# Patient Record
Sex: Female | Born: 1950 | Race: White | Hispanic: No | Smoking: Never smoker
Health system: Southern US, Community
[De-identification: ages and names within clinical notes are randomized; demographics above are authoritative.]

## PROBLEM LIST (undated history)

## (undated) DIAGNOSIS — J309 Allergic rhinitis, unspecified: Secondary | ICD-10-CM

## (undated) DIAGNOSIS — E559 Vitamin D deficiency, unspecified: Secondary | ICD-10-CM

## (undated) DIAGNOSIS — K219 Gastro-esophageal reflux disease without esophagitis: Secondary | ICD-10-CM

## (undated) DIAGNOSIS — N3020 Other chronic cystitis without hematuria: Secondary | ICD-10-CM

## (undated) DIAGNOSIS — Z9289 Personal history of other medical treatment: Secondary | ICD-10-CM

## (undated) DIAGNOSIS — D509 Iron deficiency anemia, unspecified: Secondary | ICD-10-CM

## (undated) DIAGNOSIS — E039 Hypothyroidism, unspecified: Secondary | ICD-10-CM

## (undated) DIAGNOSIS — G43909 Migraine, unspecified, not intractable, without status migrainosus: Secondary | ICD-10-CM

## (undated) DIAGNOSIS — Z87898 Personal history of other specified conditions: Secondary | ICD-10-CM

## (undated) DIAGNOSIS — H04129 Dry eye syndrome of unspecified lacrimal gland: Secondary | ICD-10-CM

## (undated) DIAGNOSIS — D649 Anemia, unspecified: Secondary | ICD-10-CM

## (undated) DIAGNOSIS — R202 Paresthesia of skin: Secondary | ICD-10-CM

## (undated) DIAGNOSIS — M797 Fibromyalgia: Secondary | ICD-10-CM

## (undated) DIAGNOSIS — Z5189 Encounter for other specified aftercare: Secondary | ICD-10-CM

## (undated) DIAGNOSIS — A159 Respiratory tuberculosis unspecified: Secondary | ICD-10-CM

## (undated) DIAGNOSIS — Z87442 Personal history of urinary calculi: Secondary | ICD-10-CM

## (undated) DIAGNOSIS — D351 Benign neoplasm of parathyroid gland: Secondary | ICD-10-CM

## (undated) DIAGNOSIS — R911 Solitary pulmonary nodule: Secondary | ICD-10-CM

## (undated) DIAGNOSIS — J45909 Unspecified asthma, uncomplicated: Secondary | ICD-10-CM

## (undated) DIAGNOSIS — E785 Hyperlipidemia, unspecified: Secondary | ICD-10-CM

## (undated) DIAGNOSIS — J189 Pneumonia, unspecified organism: Secondary | ICD-10-CM

## (undated) DIAGNOSIS — F0781 Postconcussional syndrome: Secondary | ICD-10-CM

## (undated) DIAGNOSIS — J4530 Mild persistent asthma, uncomplicated: Secondary | ICD-10-CM

## (undated) DIAGNOSIS — R7611 Nonspecific reaction to tuberculin skin test without active tuberculosis: Secondary | ICD-10-CM

## (undated) DIAGNOSIS — J449 Chronic obstructive pulmonary disease, unspecified: Secondary | ICD-10-CM

## (undated) DIAGNOSIS — R0600 Dyspnea, unspecified: Secondary | ICD-10-CM

## (undated) DIAGNOSIS — R5381 Other malaise: Secondary | ICD-10-CM

## (undated) DIAGNOSIS — R0789 Other chest pain: Secondary | ICD-10-CM

## (undated) DIAGNOSIS — F09 Unspecified mental disorder due to known physiological condition: Secondary | ICD-10-CM

## (undated) DIAGNOSIS — C55 Malignant neoplasm of uterus, part unspecified: Secondary | ICD-10-CM

## (undated) DIAGNOSIS — M5412 Radiculopathy, cervical region: Secondary | ICD-10-CM

## (undated) DIAGNOSIS — K2970 Gastritis, unspecified, without bleeding: Secondary | ICD-10-CM

## (undated) DIAGNOSIS — K589 Irritable bowel syndrome without diarrhea: Secondary | ICD-10-CM

## (undated) DIAGNOSIS — O871 Deep phlebothrombosis in the puerperium: Secondary | ICD-10-CM

## (undated) DIAGNOSIS — J42 Unspecified chronic bronchitis: Secondary | ICD-10-CM

## (undated) DIAGNOSIS — R5383 Other fatigue: Secondary | ICD-10-CM

## (undated) DIAGNOSIS — F419 Anxiety disorder, unspecified: Secondary | ICD-10-CM

## (undated) DIAGNOSIS — F329 Major depressive disorder, single episode, unspecified: Secondary | ICD-10-CM

## (undated) DIAGNOSIS — N393 Stress incontinence (female) (male): Secondary | ICD-10-CM

## (undated) DIAGNOSIS — R918 Other nonspecific abnormal finding of lung field: Secondary | ICD-10-CM

## (undated) DIAGNOSIS — R9389 Abnormal findings on diagnostic imaging of other specified body structures: Secondary | ICD-10-CM

## (undated) DIAGNOSIS — R51 Headache: Secondary | ICD-10-CM

## (undated) DIAGNOSIS — F332 Major depressive disorder, recurrent severe without psychotic features: Secondary | ICD-10-CM

## (undated) DIAGNOSIS — N200 Calculus of kidney: Secondary | ICD-10-CM

## (undated) DIAGNOSIS — R1084 Generalized abdominal pain: Secondary | ICD-10-CM

## (undated) DIAGNOSIS — I10 Essential (primary) hypertension: Secondary | ICD-10-CM

## (undated) DIAGNOSIS — M1990 Unspecified osteoarthritis, unspecified site: Secondary | ICD-10-CM

## (undated) DIAGNOSIS — J069 Acute upper respiratory infection, unspecified: Secondary | ICD-10-CM

## (undated) DIAGNOSIS — N183 Chronic kidney disease, stage 3 unspecified: Secondary | ICD-10-CM

## (undated) DIAGNOSIS — Z8719 Personal history of other diseases of the digestive system: Secondary | ICD-10-CM

## (undated) DIAGNOSIS — C499 Malignant neoplasm of connective and soft tissue, unspecified: Secondary | ICD-10-CM

## (undated) DIAGNOSIS — R6889 Other general symptoms and signs: Secondary | ICD-10-CM

## (undated) DIAGNOSIS — R110 Nausea: Secondary | ICD-10-CM

## (undated) DIAGNOSIS — S060X0A Concussion without loss of consciousness, initial encounter: Secondary | ICD-10-CM

## (undated) DIAGNOSIS — R3915 Urgency of urination: Secondary | ICD-10-CM

## (undated) DIAGNOSIS — Z9221 Personal history of antineoplastic chemotherapy: Secondary | ICD-10-CM

## (undated) DIAGNOSIS — F314 Bipolar disorder, current episode depressed, severe, without psychotic features: Secondary | ICD-10-CM

## (undated) DIAGNOSIS — Z87820 Personal history of traumatic brain injury: Secondary | ICD-10-CM

## (undated) DIAGNOSIS — Z780 Asymptomatic menopausal state: Secondary | ICD-10-CM

## (undated) DIAGNOSIS — Z973 Presence of spectacles and contact lenses: Secondary | ICD-10-CM

## (undated) DIAGNOSIS — IMO0001 Reserved for inherently not codable concepts without codable children: Secondary | ICD-10-CM

## (undated) DIAGNOSIS — M79605 Pain in left leg: Secondary | ICD-10-CM

## (undated) DIAGNOSIS — F411 Generalized anxiety disorder: Secondary | ICD-10-CM

## (undated) DIAGNOSIS — K580 Irritable bowel syndrome with diarrhea: Secondary | ICD-10-CM

## (undated) DIAGNOSIS — R55 Syncope and collapse: Secondary | ICD-10-CM

## (undated) DIAGNOSIS — J45991 Cough variant asthma: Secondary | ICD-10-CM

## (undated) DIAGNOSIS — Z227 Latent tuberculosis: Secondary | ICD-10-CM

## (undated) DIAGNOSIS — R769 Abnormal immunological finding in serum, unspecified: Secondary | ICD-10-CM

## (undated) DIAGNOSIS — Z86718 Personal history of other venous thrombosis and embolism: Secondary | ICD-10-CM

## (undated) HISTORY — DX: Major depressive disorder, recurrent severe without psychotic features: F33.2

## (undated) HISTORY — DX: Bipolar disorder, current episode depressed, severe, without psychotic features: F31.4

## (undated) HISTORY — DX: Acute upper respiratory infection, unspecified: J06.9

## (undated) HISTORY — PX: ANTERIOR FUSION CERVICAL SPINE: SUR626

## (undated) HISTORY — DX: Unspecified asthma, uncomplicated: J45.909

## (undated) HISTORY — DX: Other chest pain: R07.89

## (undated) HISTORY — PX: LAPAROSCOPIC VAGINAL HYSTERECTOMY WITH SALPINGO OOPHORECTOMY: SHX6681

## (undated) HISTORY — DX: Syncope and collapse: R55

## (undated) HISTORY — DX: Vitamin D deficiency, unspecified: E55.9

## (undated) HISTORY — DX: Asymptomatic menopausal state: Z78.0

## (undated) HISTORY — DX: Latent tuberculosis: Z22.7

## (undated) HISTORY — DX: Benign neoplasm of parathyroid gland: D35.1

## (undated) HISTORY — PX: PUBOVAGINAL SLING: SHX1035

## (undated) HISTORY — DX: Allergic rhinitis, unspecified: J30.9

## (undated) HISTORY — DX: Irritable bowel syndrome without diarrhea: K58.9

## (undated) HISTORY — DX: Nausea: R11.0

## (undated) HISTORY — DX: Other malaise: R53.81

## (undated) HISTORY — PX: POSTERIOR FUSION CERVICAL SPINE: SUR628

## (undated) HISTORY — DX: Abnormal immunological finding in serum, unspecified: R76.9

## (undated) HISTORY — DX: Major depressive disorder, single episode, unspecified: F32.9

## (undated) HISTORY — DX: Malignant neoplasm of uterus, part unspecified: C55

## (undated) HISTORY — DX: Reserved for inherently not codable concepts without codable children: IMO0001

## (undated) HISTORY — DX: Generalized abdominal pain: R10.84

## (undated) HISTORY — DX: Gastritis, unspecified, without bleeding: K29.70

## (undated) HISTORY — DX: Hyperlipidemia, unspecified: E78.5

## (undated) HISTORY — DX: Hypothyroidism, unspecified: E03.9

## (undated) HISTORY — DX: Gastro-esophageal reflux disease without esophagitis: K21.9

## (undated) HISTORY — PX: COLONOSCOPY WITH ESOPHAGOGASTRODUODENOSCOPY (EGD): SHX5779

## (undated) HISTORY — DX: Migraine, unspecified, not intractable, without status migrainosus: G43.909

## (undated) HISTORY — DX: Concussion without loss of consciousness, initial encounter: S06.0X0A

## (undated) HISTORY — DX: Abnormal findings on diagnostic imaging of other specified body structures: R93.89

## (undated) HISTORY — DX: Personal history of other diseases of the digestive system: Z87.19

## (undated) HISTORY — DX: Deep phlebothrombosis in the puerperium: O87.1

## (undated) HISTORY — DX: Stress incontinence (female) (male): N39.3

## (undated) HISTORY — DX: Other fatigue: R53.83

## (undated) HISTORY — DX: Solitary pulmonary nodule: R91.1

## (undated) HISTORY — DX: Paresthesia of skin: R20.2

## (undated) HISTORY — PX: EXPLORATORY LAPAROTOMY: SUR591

## (undated) HISTORY — DX: Postconcussional syndrome: F07.81

## (undated) HISTORY — DX: Malignant neoplasm of connective and soft tissue, unspecified: C49.9

## (undated) HISTORY — PX: OTHER SURGICAL HISTORY: SHX169

## (undated) HISTORY — DX: Radiculopathy, cervical region: M54.12

## (undated) HISTORY — PX: VAGINAL HYSTERECTOMY: SUR661

## (undated) HISTORY — DX: Encounter for other specified aftercare: Z51.89

## (undated) HISTORY — DX: Anemia, unspecified: D64.9

## (undated) HISTORY — DX: Pain in left leg: M79.605

## (undated) HISTORY — DX: Cough variant asthma: J45.991

## (undated) HISTORY — PX: BLADDER SURGERY: SHX569

## (undated) HISTORY — DX: Irritable bowel syndrome with diarrhea: K58.0

## (undated) HISTORY — DX: Headache: R51

## (undated) HISTORY — DX: Other nonspecific abnormal finding of lung field: R91.8

## (undated) HISTORY — DX: Other general symptoms and signs: R68.89

## (undated) HISTORY — DX: Essential (primary) hypertension: I10

## (undated) HISTORY — DX: Anxiety disorder, unspecified: F41.9

## (undated) HISTORY — PX: COLONOSCOPY: SHX174

## (undated) HISTORY — DX: Unspecified mental disorder due to known physiological condition: F09

## (undated) HISTORY — DX: Personal history of antineoplastic chemotherapy: Z92.21

---

## 1997-12-27 ENCOUNTER — Ambulatory Visit (HOSPITAL_COMMUNITY): Admission: RE | Admit: 1997-12-27 | Discharge: 1997-12-27 | Payer: Self-pay | Admitting: Internal Medicine

## 1998-03-31 ENCOUNTER — Ambulatory Visit (HOSPITAL_COMMUNITY): Admission: RE | Admit: 1998-03-31 | Discharge: 1998-03-31 | Payer: Self-pay | Admitting: *Deleted

## 1998-11-04 ENCOUNTER — Ambulatory Visit (HOSPITAL_COMMUNITY): Admission: RE | Admit: 1998-11-04 | Discharge: 1998-11-04 | Payer: Self-pay | Admitting: Obstetrics and Gynecology

## 1999-10-07 ENCOUNTER — Encounter: Admission: RE | Admit: 1999-10-07 | Discharge: 1999-11-10 | Payer: Self-pay | Admitting: Sports Medicine

## 1999-11-09 ENCOUNTER — Encounter: Payer: Self-pay | Admitting: Obstetrics and Gynecology

## 1999-11-09 ENCOUNTER — Ambulatory Visit (HOSPITAL_COMMUNITY): Admission: RE | Admit: 1999-11-09 | Discharge: 1999-11-09 | Payer: Self-pay | Admitting: Obstetrics and Gynecology

## 1999-12-30 ENCOUNTER — Other Ambulatory Visit: Admission: RE | Admit: 1999-12-30 | Discharge: 1999-12-30 | Payer: Self-pay | Admitting: Obstetrics and Gynecology

## 2000-02-23 ENCOUNTER — Ambulatory Visit (HOSPITAL_COMMUNITY): Admission: RE | Admit: 2000-02-23 | Discharge: 2000-02-23 | Payer: Self-pay | Admitting: Internal Medicine

## 2001-01-20 ENCOUNTER — Ambulatory Visit (HOSPITAL_COMMUNITY): Admission: RE | Admit: 2001-01-20 | Discharge: 2001-01-20 | Payer: Self-pay | Admitting: Internal Medicine

## 2001-07-13 ENCOUNTER — Encounter: Payer: Self-pay | Admitting: Obstetrics and Gynecology

## 2001-07-13 ENCOUNTER — Ambulatory Visit (HOSPITAL_COMMUNITY): Admission: RE | Admit: 2001-07-13 | Discharge: 2001-07-13 | Payer: Self-pay | Admitting: Obstetrics and Gynecology

## 2001-12-12 ENCOUNTER — Other Ambulatory Visit: Admission: RE | Admit: 2001-12-12 | Discharge: 2001-12-12 | Payer: Self-pay | Admitting: Obstetrics and Gynecology

## 2003-04-09 ENCOUNTER — Other Ambulatory Visit: Admission: RE | Admit: 2003-04-09 | Discharge: 2003-04-09 | Payer: Self-pay | Admitting: Obstetrics and Gynecology

## 2003-07-04 ENCOUNTER — Inpatient Hospital Stay (HOSPITAL_COMMUNITY): Admission: RE | Admit: 2003-07-04 | Discharge: 2003-07-06 | Payer: Self-pay | Admitting: Urology

## 2003-07-04 ENCOUNTER — Encounter (INDEPENDENT_AMBULATORY_CARE_PROVIDER_SITE_OTHER): Payer: Self-pay | Admitting: Specialist

## 2004-01-27 ENCOUNTER — Encounter: Admission: RE | Admit: 2004-01-27 | Discharge: 2004-04-26 | Payer: Self-pay | Admitting: Internal Medicine

## 2004-03-17 ENCOUNTER — Ambulatory Visit: Payer: Self-pay | Admitting: Psychology

## 2004-04-21 ENCOUNTER — Ambulatory Visit: Payer: Self-pay | Admitting: Internal Medicine

## 2004-04-27 ENCOUNTER — Encounter: Admission: RE | Admit: 2004-04-27 | Discharge: 2004-05-04 | Payer: Self-pay | Admitting: Internal Medicine

## 2004-05-17 HISTORY — PX: ANTERIOR FUSION CERVICAL SPINE: SUR626

## 2004-05-17 HISTORY — PX: NECK SURGERY: SHX720

## 2004-06-04 ENCOUNTER — Ambulatory Visit: Payer: Self-pay | Admitting: Internal Medicine

## 2004-06-16 ENCOUNTER — Ambulatory Visit: Payer: Self-pay | Admitting: Psychology

## 2004-06-19 ENCOUNTER — Ambulatory Visit: Payer: Self-pay | Admitting: Internal Medicine

## 2004-07-13 ENCOUNTER — Ambulatory Visit: Payer: Self-pay | Admitting: Internal Medicine

## 2004-07-14 ENCOUNTER — Ambulatory Visit: Payer: Self-pay | Admitting: Internal Medicine

## 2004-08-18 ENCOUNTER — Ambulatory Visit: Payer: Self-pay | Admitting: Psychology

## 2004-09-24 ENCOUNTER — Ambulatory Visit: Payer: Self-pay | Admitting: Psychology

## 2004-10-09 ENCOUNTER — Ambulatory Visit: Payer: Self-pay | Admitting: Internal Medicine

## 2004-10-20 ENCOUNTER — Other Ambulatory Visit: Admission: RE | Admit: 2004-10-20 | Discharge: 2004-10-20 | Payer: Self-pay | Admitting: Obstetrics and Gynecology

## 2005-01-27 ENCOUNTER — Encounter: Admission: RE | Admit: 2005-01-27 | Discharge: 2005-01-27 | Payer: Self-pay | Admitting: Orthopaedic Surgery

## 2005-02-10 ENCOUNTER — Ambulatory Visit: Payer: Self-pay | Admitting: Psychology

## 2005-02-15 ENCOUNTER — Ambulatory Visit: Payer: Self-pay | Admitting: Internal Medicine

## 2005-03-16 ENCOUNTER — Ambulatory Visit: Payer: Self-pay | Admitting: Internal Medicine

## 2005-03-19 ENCOUNTER — Ambulatory Visit: Payer: Self-pay | Admitting: Psychology

## 2005-03-31 ENCOUNTER — Inpatient Hospital Stay (HOSPITAL_COMMUNITY): Admission: RE | Admit: 2005-03-31 | Discharge: 2005-04-02 | Payer: Self-pay | Admitting: Neurosurgery

## 2005-04-01 ENCOUNTER — Ambulatory Visit: Payer: Self-pay | Admitting: Cardiology

## 2005-04-02 ENCOUNTER — Ambulatory Visit: Payer: Self-pay | Admitting: Cardiology

## 2005-06-04 ENCOUNTER — Ambulatory Visit: Payer: Self-pay | Admitting: Psychology

## 2005-06-08 ENCOUNTER — Ambulatory Visit: Payer: Self-pay | Admitting: Internal Medicine

## 2005-07-19 ENCOUNTER — Ambulatory Visit: Payer: Self-pay | Admitting: Psychology

## 2005-09-15 ENCOUNTER — Encounter: Payer: Self-pay | Admitting: Obstetrics and Gynecology

## 2005-11-12 ENCOUNTER — Ambulatory Visit: Payer: Self-pay | Admitting: Psychology

## 2005-12-20 ENCOUNTER — Ambulatory Visit: Payer: Self-pay | Admitting: Psychology

## 2006-02-04 ENCOUNTER — Ambulatory Visit: Payer: Self-pay | Admitting: Psychology

## 2006-02-20 ENCOUNTER — Ambulatory Visit (HOSPITAL_COMMUNITY): Admission: RE | Admit: 2006-02-20 | Discharge: 2006-02-20 | Payer: Self-pay | Admitting: Neurosurgery

## 2006-03-14 ENCOUNTER — Ambulatory Visit: Payer: Self-pay | Admitting: Psychology

## 2006-05-27 ENCOUNTER — Ambulatory Visit: Payer: Self-pay | Admitting: Internal Medicine

## 2006-06-06 ENCOUNTER — Ambulatory Visit: Payer: Self-pay | Admitting: Psychology

## 2006-06-16 ENCOUNTER — Ambulatory Visit: Payer: Self-pay | Admitting: Internal Medicine

## 2006-09-19 ENCOUNTER — Ambulatory Visit: Payer: Self-pay | Admitting: Psychology

## 2006-10-11 ENCOUNTER — Ambulatory Visit: Payer: Self-pay | Admitting: Psychology

## 2006-10-16 ENCOUNTER — Emergency Department (HOSPITAL_COMMUNITY): Admission: EM | Admit: 2006-10-16 | Discharge: 2006-10-16 | Payer: Self-pay | Admitting: Emergency Medicine

## 2006-11-15 ENCOUNTER — Ambulatory Visit: Payer: Self-pay | Admitting: Psychology

## 2007-03-04 ENCOUNTER — Encounter: Payer: Self-pay | Admitting: Internal Medicine

## 2007-03-04 DIAGNOSIS — K219 Gastro-esophageal reflux disease without esophagitis: Secondary | ICD-10-CM | POA: Insufficient documentation

## 2007-03-04 DIAGNOSIS — E785 Hyperlipidemia, unspecified: Secondary | ICD-10-CM | POA: Insufficient documentation

## 2007-03-04 DIAGNOSIS — J309 Allergic rhinitis, unspecified: Secondary | ICD-10-CM | POA: Insufficient documentation

## 2007-03-04 DIAGNOSIS — Z8719 Personal history of other diseases of the digestive system: Secondary | ICD-10-CM

## 2007-03-04 HISTORY — DX: Hyperlipidemia, unspecified: E78.5

## 2007-03-04 HISTORY — DX: Allergic rhinitis, unspecified: J30.9

## 2007-03-04 HISTORY — DX: Personal history of other diseases of the digestive system: Z87.19

## 2007-03-06 ENCOUNTER — Ambulatory Visit: Payer: Self-pay | Admitting: Internal Medicine

## 2007-06-16 ENCOUNTER — Ambulatory Visit: Payer: Self-pay | Admitting: Internal Medicine

## 2007-06-16 LAB — CONVERTED CEMR LAB
ALT: 21 units/L (ref 0–35)
AST: 26 units/L (ref 0–37)
Albumin: 4.1 g/dL (ref 3.5–5.2)
Alkaline Phosphatase: 92 units/L (ref 39–117)
BUN: 18 mg/dL (ref 6–23)
Basophils Absolute: 0.1 10*3/uL (ref 0.0–0.1)
Basophils Relative: 0.6 % (ref 0.0–1.0)
Bilirubin, Direct: 0.1 mg/dL (ref 0.0–0.3)
CO2: 31 meq/L (ref 19–32)
Calcium: 10.6 mg/dL — ABNORMAL HIGH (ref 8.4–10.5)
Chloride: 101 meq/L (ref 96–112)
Cholesterol: 187 mg/dL (ref 0–200)
Creatinine, Ser: 0.8 mg/dL (ref 0.4–1.2)
Eosinophils Absolute: 0.3 10*3/uL (ref 0.0–0.6)
Eosinophils Relative: 2.8 % (ref 0.0–5.0)
GFR calc Af Amer: 95 mL/min
GFR calc non Af Amer: 79 mL/min
Glucose, Bld: 77 mg/dL (ref 70–99)
HCT: 37.4 % (ref 36.0–46.0)
HDL: 62.2 mg/dL (ref >39.0)
Hemoglobin: 12.4 g/dL (ref 12.0–15.0)
LDL Cholesterol: 96 mg/dL (ref 0–99)
Lymphocytes Relative: 30.5 % (ref 12.0–46.0)
MCHC: 33.1 g/dL (ref 30.0–36.0)
MCV: 89.2 fL (ref 78.0–100.0)
Monocytes Absolute: 0.6 10*3/uL (ref 0.2–0.7)
Monocytes Relative: 5.8 % (ref 3.0–11.0)
Neutro Abs: 5.9 10*3/uL (ref 1.4–7.7)
Neutrophils Relative %: 60.3 % (ref 43.0–77.0)
Platelets: 473 10*3/uL — ABNORMAL HIGH (ref 150–400)
Potassium: 3.3 meq/L — ABNORMAL LOW (ref 3.5–5.1)
RBC: 4.19 M/uL (ref 3.87–5.11)
RDW: 12.3 % (ref 11.5–14.6)
Sodium: 139 meq/L (ref 135–145)
TSH: 5.23 microintl units/mL (ref 0.35–5.50)
Total Bilirubin: 0.7 mg/dL (ref 0.3–1.2)
Total CHOL/HDL Ratio: 3
Total Protein: 7.1 g/dL (ref 6.0–8.3)
Triglycerides: 143 mg/dL (ref 0–149)
VLDL: 29 mg/dL (ref 0–40)
WBC: 9.9 10*3/uL (ref 4.5–10.5)

## 2007-06-22 ENCOUNTER — Encounter: Payer: Self-pay | Admitting: Internal Medicine

## 2007-07-13 ENCOUNTER — Observation Stay (HOSPITAL_COMMUNITY): Admission: EM | Admit: 2007-07-13 | Discharge: 2007-07-14 | Payer: Self-pay | Admitting: Emergency Medicine

## 2007-07-13 ENCOUNTER — Ambulatory Visit: Payer: Self-pay | Admitting: Internal Medicine

## 2007-07-19 ENCOUNTER — Ambulatory Visit: Payer: Self-pay | Admitting: Psychology

## 2007-07-25 ENCOUNTER — Encounter: Payer: Self-pay | Admitting: Internal Medicine

## 2007-07-26 ENCOUNTER — Ambulatory Visit: Payer: Self-pay | Admitting: Cardiology

## 2007-07-28 ENCOUNTER — Telehealth: Payer: Self-pay | Admitting: Internal Medicine

## 2007-08-01 ENCOUNTER — Telehealth: Payer: Self-pay | Admitting: Internal Medicine

## 2007-08-02 ENCOUNTER — Encounter: Payer: Self-pay | Admitting: Internal Medicine

## 2007-08-02 ENCOUNTER — Ambulatory Visit: Payer: Self-pay

## 2007-08-23 ENCOUNTER — Ambulatory Visit: Payer: Self-pay | Admitting: Internal Medicine

## 2007-08-30 ENCOUNTER — Encounter: Payer: Self-pay | Admitting: Internal Medicine

## 2007-08-30 ENCOUNTER — Ambulatory Visit (HOSPITAL_COMMUNITY): Admission: RE | Admit: 2007-08-30 | Discharge: 2007-08-30 | Payer: Self-pay | Admitting: Internal Medicine

## 2007-09-06 ENCOUNTER — Ambulatory Visit: Payer: Self-pay | Admitting: Internal Medicine

## 2007-11-03 ENCOUNTER — Telehealth: Payer: Self-pay | Admitting: Internal Medicine

## 2007-12-20 ENCOUNTER — Telehealth: Payer: Self-pay | Admitting: Internal Medicine

## 2007-12-25 ENCOUNTER — Telehealth: Payer: Self-pay | Admitting: Internal Medicine

## 2008-01-15 ENCOUNTER — Telehealth: Payer: Self-pay | Admitting: Internal Medicine

## 2008-01-17 ENCOUNTER — Ambulatory Visit: Payer: Self-pay | Admitting: Psychology

## 2008-02-07 ENCOUNTER — Ambulatory Visit: Payer: Self-pay | Admitting: Psychology

## 2008-02-14 ENCOUNTER — Ambulatory Visit: Payer: Self-pay | Admitting: Internal Medicine

## 2008-02-14 DIAGNOSIS — F3130 Bipolar disorder, current episode depressed, mild or moderate severity, unspecified: Secondary | ICD-10-CM | POA: Insufficient documentation

## 2008-02-14 DIAGNOSIS — O871 Deep phlebothrombosis in the puerperium: Secondary | ICD-10-CM | POA: Insufficient documentation

## 2008-02-14 DIAGNOSIS — F314 Bipolar disorder, current episode depressed, severe, without psychotic features: Secondary | ICD-10-CM | POA: Insufficient documentation

## 2008-02-14 HISTORY — DX: Bipolar disorder, current episode depressed, mild or moderate severity, unspecified: F31.30

## 2008-02-14 HISTORY — DX: Deep phlebothrombosis in the puerperium: O87.1

## 2008-03-20 ENCOUNTER — Encounter: Payer: Self-pay | Admitting: Internal Medicine

## 2008-04-29 ENCOUNTER — Encounter: Payer: Self-pay | Admitting: Internal Medicine

## 2008-06-14 ENCOUNTER — Ambulatory Visit: Payer: Self-pay | Admitting: Internal Medicine

## 2008-07-03 ENCOUNTER — Ambulatory Visit: Payer: Self-pay | Admitting: Internal Medicine

## 2008-07-03 DIAGNOSIS — IMO0001 Reserved for inherently not codable concepts without codable children: Secondary | ICD-10-CM

## 2008-07-03 HISTORY — DX: Reserved for inherently not codable concepts without codable children: IMO0001

## 2008-07-10 ENCOUNTER — Encounter: Payer: Self-pay | Admitting: Internal Medicine

## 2008-07-11 ENCOUNTER — Encounter: Payer: Self-pay | Admitting: Internal Medicine

## 2008-09-16 ENCOUNTER — Ambulatory Visit: Payer: Self-pay | Admitting: Psychology

## 2008-09-23 ENCOUNTER — Ambulatory Visit: Payer: Self-pay | Admitting: Internal Medicine

## 2008-09-23 LAB — CONVERTED CEMR LAB
ALT: 11 units/L (ref 0–35)
AST: 17 units/L (ref 0–37)
Albumin: 3.7 g/dL (ref 3.5–5.2)
Alkaline Phosphatase: 89 units/L (ref 39–117)
Ammonia: 14 umol/L (ref 11–35)
BUN: 21 mg/dL (ref 6–23)
Basophils Absolute: 0 10*3/uL (ref 0.0–0.1)
Basophils Relative: 0.6 % (ref 0.0–3.0)
Bilirubin, Direct: 0.1 mg/dL (ref 0.0–0.3)
CO2: 33 meq/L — ABNORMAL HIGH (ref 19–32)
Calcium: 10.7 mg/dL — ABNORMAL HIGH (ref 8.4–10.5)
Chloride: 107 meq/L (ref 96–112)
Creatinine, Ser: 0.8 mg/dL (ref 0.4–1.2)
Eosinophils Absolute: 0.2 10*3/uL (ref 0.0–0.7)
Eosinophils Relative: 4.3 % (ref 0.0–5.0)
GFR calc non Af Amer: 78.11 mL/min (ref >60)
Glucose, Bld: 85 mg/dL (ref 70–99)
HCT: 36.3 % (ref 36.0–46.0)
Hemoglobin: 12.5 g/dL (ref 12.0–15.0)
Hgb A1c MFr Bld: 5.5 % (ref 4.6–6.5)
Lymphocytes Relative: 35.6 % (ref 12.0–46.0)
Lymphs Abs: 2 10*3/uL (ref 0.7–4.0)
MCHC: 34.3 g/dL (ref 30.0–36.0)
MCV: 89.5 fL (ref 78.0–100.0)
Monocytes Absolute: 0.3 10*3/uL (ref 0.1–1.0)
Monocytes Relative: 5.6 % (ref 3.0–12.0)
Neutro Abs: 3 10*3/uL (ref 1.4–7.7)
Neutrophils Relative %: 53.9 % (ref 43.0–77.0)
Platelets: 371 10*3/uL (ref 150.0–400.0)
Potassium: 3.5 meq/L (ref 3.5–5.1)
RBC: 4.05 M/uL (ref 3.87–5.11)
RDW: 12.7 % (ref 11.5–14.6)
Sodium: 144 meq/L (ref 135–145)
TSH: 3.63 microintl units/mL (ref 0.35–5.50)
Total Bilirubin: 0.4 mg/dL (ref 0.3–1.2)
Total Protein: 6.7 g/dL (ref 6.0–8.3)
Valproic Acid Lvl: <1 ug/mL — ABNORMAL LOW (ref 50.0–100.0)
WBC: 5.5 10*3/uL (ref 4.5–10.5)

## 2008-09-26 ENCOUNTER — Ambulatory Visit: Payer: Self-pay | Admitting: Psychology

## 2008-10-21 ENCOUNTER — Ambulatory Visit: Payer: Self-pay | Admitting: Psychology

## 2008-10-31 ENCOUNTER — Telehealth: Payer: Self-pay | Admitting: Internal Medicine

## 2008-11-04 ENCOUNTER — Ambulatory Visit: Payer: Self-pay | Admitting: Psychology

## 2008-11-16 ENCOUNTER — Emergency Department (HOSPITAL_COMMUNITY): Admission: EM | Admit: 2008-11-16 | Discharge: 2008-11-16 | Payer: Self-pay | Admitting: Emergency Medicine

## 2008-11-26 ENCOUNTER — Encounter: Admission: RE | Admit: 2008-11-26 | Discharge: 2008-11-26 | Payer: Self-pay | Admitting: Otolaryngology

## 2008-12-02 ENCOUNTER — Ambulatory Visit: Payer: Self-pay | Admitting: Psychology

## 2009-01-02 ENCOUNTER — Telehealth: Payer: Self-pay | Admitting: Internal Medicine

## 2009-01-09 ENCOUNTER — Telehealth: Payer: Self-pay | Admitting: Internal Medicine

## 2009-02-17 ENCOUNTER — Ambulatory Visit: Payer: Self-pay | Admitting: Internal Medicine

## 2009-06-19 ENCOUNTER — Ambulatory Visit: Payer: Self-pay | Admitting: Internal Medicine

## 2009-10-27 ENCOUNTER — Ambulatory Visit: Payer: Self-pay | Admitting: Internal Medicine

## 2010-06-15 ENCOUNTER — Encounter
Admission: RE | Admit: 2010-06-15 | Discharge: 2010-06-15 | Payer: Self-pay | Source: Home / Self Care | Attending: Obstetrics and Gynecology | Admitting: Obstetrics and Gynecology

## 2010-06-16 NOTE — Progress Notes (Signed)
Summary: Benicar    Prescriptions: BENICAR HCT 20-12.5 MG  TABS (OLMESARTAN MEDOXOMIL-HCTZ) 1 by mouth once daily  #90 x 3   Entered and Authorized by:   Beola Cord, CMA   Signed by:   Beola Cord, CMA on 10/31/2008   Method used:   Faxed to ...       [facility] [phone]* (retail)       [address]       [place], [place]  [address]       Ph: [phone]       Fax: [phone]   RxID:   [id number]

## 2010-06-16 NOTE — Letter (Signed)
   [facility] [address] [place], [place]  [address] Phone: [phone]      June 22, 2007   [facility] [address] [place], [place] [address]  RE:  LAB RESULTS  Dear  Ms. MARKS,  The following is an interpretation of your most recent lab tests.  Please take note of any instructions provided or changes to medications that have resulted from your lab work.  ELECTROLYTES:  Good - no changes needed  KIDNEY FUNCTION TESTS:  Good - no changes needed  LIVER FUNCTION TESTS:  Good - no changes needed  LIPID PANEL:  Good - no changes needed Triglyceride: 143   Cholesterol: 187   LDL: 96   HDL: 62.2   Chol/HDL%:  3.0 CALC  THYROID STUDIES:  Thyroid studies normal TSH: 5.23     DIABETIC STUDIES:  Good - no changes needed Blood Glucose: 77    CBC:  Good - no changes needed    Great looking labs. Call or e-mail me if you have questions ([email or web address])    Sincerely Yours,    Jacques Navy MD

## 2010-06-16 NOTE — Progress Notes (Signed)
Summary: Refills  Phone Note Call from Patient   Summary of Call: Patient is requesting rx's to [facility] to [facility] w/90 day supply Initial call taken by: Lamar Sprinkles,  December 25, 2007 5:23 PM      Prescriptions: SIMVASTATIN 40 MG  TABS (SIMVASTATIN) 1 once daily  #90 x 3   Entered by:   Lamar Sprinkles   Authorized by:   Tresa Garter MD   Signed by:   Lamar Sprinkles on 12/25/2007   Method used:   Electronically sent to ...       [id number]       [address] [place], [place]  [address]       Ph: [phone]       Fax: [phone]   RxID:   [id number] BENICAR HCT 20-12.5 MG  TABS (OLMESARTAN MEDOXOMIL-HCTZ) 1 by mouth once daily  #90 x 3   Entered by:   Lamar Sprinkles   Authorized by:   Tresa Garter MD   Signed by:   Lamar Sprinkles on 12/25/2007   Method used:   Electronically sent to ...       [facility]       [address] [place], [place]  [address]       Ph: [phone]       Fax: [phone]   RxID:   [id number] OMEPRAZOLE 20 MG CPDR (OMEPRAZOLE) 2 once daily  #180 x 3   Entered by:   Lamar Sprinkles   Authorized by:   Tresa Garter MD   Signed by:   Lamar Sprinkles on 12/25/2007   Method used:   Electronically sent to ...       [facility]       [address] [place], [place]  [address]       Ph: [phone]       Fax: [phone]   RxID:   [id number] ESTRADIOL 1 MG  TABS (ESTRADIOL) once daily  #90 x 3   Entered by:   Lamar Sprinkles   Authorized by:   Tresa Garter MD   Signed by:   Lamar Sprinkles on 12/25/2007   Method used:   Electronically sent to ...       [facility]       [address] [place], [place]  [address]       Ph: [phone]       Fax: [phone]   RxID:   [id number] EFFEXOR XR 150 MG CP24 (VENLAFAXINE HCL) Take 1 tablet by  mouth once a day  #90 x 3   Entered by:   Lamar Sprinkles   Authorized by:   Tresa Garter MD   Signed by:   Lamar Sprinkles on 12/25/2007   Method used:   Electronically sent to ...       [facility]       [address] [place], [place]  [address]       Ph: [phone]  Fax: [phone]   RxID:   [id number]

## 2010-06-16 NOTE — Progress Notes (Signed)
Summary: MAIL ORDER MEDS       New/Updated Medications: ACIPHEX 20 MG TBEC (RABEPRAZOLE SODIUM) 1 once daily LIPITOR 20 MG TABS (ATORVASTATIN CALCIUM) 1 by mouth qam   Prescriptions: EFFEXOR XR 150 MG CP24 (VENLAFAXINE HCL) Take 1 tablet by mouth once a day  #90 x 3   Entered by:   Lamar Sprinkles   Authorized by:   Jacques Navy MD   Signed by:   Lamar Sprinkles on 01/15/2008   Method used:   Faxed to ...       [facility] (mail-order)             , [place]         Ph:        Fax: [phone]   RxID:   [id number] LIPITOR 20 MG TABS (ATORVASTATIN CALCIUM) 1 by mouth qam  #90 x 3   Entered by:   Lamar Sprinkles   Authorized by:   Jacques Navy MD   Signed by:   Lamar Sprinkles on 01/15/2008   Method used:   Faxed to ...       [facility] (mail-order)             , [place]         Ph:        Fax: [phone]   RxID:   [id number] ACIPHEX 20 MG TBEC (RABEPRAZOLE SODIUM) 1 once daily  #90 x 3   Entered by:   Lamar Sprinkles   Authorized by:   Jacques Navy MD   Signed by:   Lamar Sprinkles on 01/15/2008   Method used:   Faxed to ...       [facility] (mail-order)             , [place]         Ph:        Fax: [phone]   RxID:   [id number]

## 2010-06-16 NOTE — Letter (Signed)
Summary: [facility]  [facility]   Imported By: Sherian Rein 07/25/2008 08:57:49  _____________________________________________________________________  External Attachment:    Type:   Image     Comment:   External Document

## 2010-06-16 NOTE — Progress Notes (Signed)
Summary: mail order   Phone Note Refill Request   Refills Requested: Medication #1:  EFFEXOR XR 150 MG CP24 Take 1 tablet by mouth once a day  Medication #2:  ESTRADIOL 1 MG  TABS once daily  Medication #3:  OMEPRAZOLE 20 MG CPDR 2 once daily  Medication #4:  BENICAR HCT 20-12.5 MG  TABS 1 by mouth once daily  Method Requested: Fax to [facility] Initial call taken by: Rock Nephew CMA,  December 20, 2007 11:52 AM      Prescriptions: NASONEX 50 MCG/ACT  SUSP (MOMETASONE FUROATE) 1 spray each nostril as needed  #67mos x 3   Entered by:   Rock Nephew CMA   Authorized by:   Jacques Navy MD   Signed by:   Rock Nephew CMA on 12/20/2007   Method used:   Faxed to ...       [facility]             , [place]         Ph:        Fax: [phone]   RxID:   [id number] SIMVASTATIN 40 MG  TABS (SIMVASTATIN) 1 once daily  #90 x 3   Entered by:   Rock Nephew CMA   Authorized by:   Jacques Navy MD   Signed by:   Rock Nephew CMA on 12/20/2007   Method used:   Faxed to ...       [facility]             , [place]         Ph:        Fax: [phone]   RxID:   [id number] BENICAR HCT 20-12.5 MG  TABS (OLMESARTAN MEDOXOMIL-HCTZ) 1 by mouth once daily  #90 Tablet x 3   Entered by:   Rock Nephew CMA   Authorized by:   Jacques Navy MD   Signed by:   Rock Nephew CMA on 12/20/2007   Method used:   Faxed to ...       [facility]             , [place]         Ph:        Fax: [phone]   RxID:   [id number] OMEPRAZOLE 20 MG CPDR (OMEPRAZOLE) 2 once daily  #180 x 3   Entered by:   Rock Nephew CMA   Authorized by:   Jacques Navy MD   Signed by:   Rock Nephew CMA on 12/20/2007   Method used:   Faxed to ...       [facility]             , [place]         Ph:        Fax: [phone]   RxID:   [id number] ESTRADIOL 1 MG  TABS (ESTRADIOL) once daily  #90 Tablet x 3   Entered by:   Rock Nephew CMA   Authorized by:   Jacques Navy MD  Signed by:   Rock Nephew CMA on 12/20/2007   Method used:   Faxed to ...       [facility]             , [place]         Ph:        Fax: [phone]   RxID:   [id number] EFFEXOR XR 150 MG CP24 (VENLAFAXINE HCL) Take 1  tablet by mouth once a day  #90 x 3   Entered by:   Rock Nephew CMA   Authorized by:   Jacques Navy MD   Signed by:   Rock Nephew CMA on 12/20/2007   Method used:   Faxed to ...       [facility]             , [place]         Ph:        Fax: [phone]   RxID:   [id number]

## 2010-06-16 NOTE — Assessment & Plan Note (Signed)
Summary: ANXIETY,DEPRESSION/$50/PN   Vital Signs:  Patient profile:   60 year old female Height:      66 inches Weight:      134.13 pounds BMI:     21.73 O2 Sat:      97 % Temp:     96.6 degrees F oral Pulse rate:   76 / minute BP sitting:   122 / 70  (left arm) Cuff size:   regular  Vitals Entered By: Zackery Barefoot CMA (Sep 23, 2008 3:48 PM)  Primary Care Provider:  Ridhi Hoffert   History of Present Illness: patient presents with c/o worsening depression. She sees Dr. Tomasa Rand. She has been but is not now suicidal. she has been started on lamictal, abilify and wellbutrin. She reports that she does have [place], she has had syncope and she feels that she is actually getting worse rather than better. she is asking my advice as to  her medications - stated that we should stick with a single MD to manage these problems.   Current Medications (verified): 1)  Multivitamins   Tabs (Multiple Vitamin) .... By Mouth Once Daily 2)  Estradiol 1 Mg  Tabs (Estradiol) .... Once Daily 3)  Aciphex 20 Mg Tbec (Rabeprazole Sodium) .Marland Kitchen.. 1 Once Daily 4)  Aleve 220 Mg  Tabs (Naproxen Sodium) .... By Mouth Two Times A Day 5)  Benicar Hct 20-12.5 Mg  Tabs (Olmesartan Medoxomil-Hctz) .Marland Kitchen.. 1 By Mouth Once Daily 6)  Lipitor 20 Mg Tabs (Atorvastatin Calcium) .Marland Kitchen.. 1 By Mouth Qam 7)  Xanax Xr 1 Mg Xr24h-Tab (Alprazolam) .... Take 1 Tablet By Mouth Once A Day 8)  Lamictal 100 Mg Tabs (Lamotrigine) .... 300mg  Daily 9)  Wellbutrin Xl 300 Mg Xr24h-Tab (Bupropion Hcl) .... Take 1 Tablet By Mouth Once A Day 10)  Abilify 5 Mg Tabs (Aripiprazole) .... Take 1 Tablet By Mouth Once A Day 11)  Ambien 10 Mg Tabs (Zolpidem Tartrate) .... Take 1 Tab By Mouth At Bedtime  Allergies (verified): 1)  ! Codeine   Impression & Recommendations:  Problem # 1:  BIPLR I MOST RECENT EPIS DPRS SEV NO PSYCHOT BHV (ICD-296.53)  Increasing problems. Did review ePocrates for side affects: abilify does list akesthesia, circulatory  problems and syncope as side affects. Dr. Tomasa Rand has requested lab; NH3, TSH. CBCD, Valproic acid level LFTs with results to be faxed to him at [phone].  Orders: T- * Misc. Laboratory test [phone]) TLB-CBC Platelet - w/Differential (85025-CBCD) TLB-Hepatic/Liver Function Pnl (80076-HEPATIC) TLB-Ammonia (82140-AMON) TLB-TSH (Thyroid Stimulating Hormone) (84443-TSH) TLB-BMP (Basic Metabolic Panel-BMET) (80048-METABOL) TLB-A1C / Hgb A1C (Glycohemoglobin) (83036-A1C)  Complete Medication List: 1)  Multivitamins Tabs (Multiple vitamin) .... By mouth once daily 2)  Estradiol 1 Mg Tabs (Estradiol) .... Once daily 3)  Aciphex 20 Mg Tbec (Rabeprazole sodium) .Marland Kitchen.. 1 once daily 4)  Aleve 220 Mg Tabs (Naproxen sodium) .... By mouth two times a day 5)  Benicar Hct 20-12.5 Mg Tabs (Olmesartan medoxomil-hctz) .Marland Kitchen.. 1 by mouth once daily 6)  Simvastatin 40 Mg Tabs (Simvastatin) .Marland Kitchen.. 1 by mouth q d 7)  Xanax Xr 1 Mg Xr24h-tab (Alprazolam) .... Take 1 tablet by mouth once a day 8)  Lamictal 100 Mg Tabs (Lamotrigine) .... 300mg  daily 9)  Wellbutrin Xl 300 Mg Xr24h-tab (Bupropion hcl) .... Take 1 tablet by mouth once a day 10)  Abilify 5 Mg Tabs (Aripiprazole) .... Take 1 tablet by mouth once a day 11)  Ambien 10 Mg Tabs (Zolpidem tartrate) .... Take 1 tab by mouth at bedtime  Tests: (1) Lipid Panel (LIPID)   Cholesterol               181 mg/dL                   4-098     ATP III Classification            Desirable:  < 200 mg/dL                    Borderline High:  200 - 239 mg/dL               High:  > = 240 mg/dL   Triglycerides             114.0 mg/dL                 1.1-914.7     Normal:  <150 mg/dL     Borderline High:  829 - 199 mg/dL   HDL                       56.21 mg/dL                 >30.86   VLDL Cholesterol          22.8 mg/dL                  5.7-84.6   LDL Cholesterol           81 mg/dL                    9-62  CHO/HDL Ratio:  CHD Risk                             2                     Men          Women     1/2 Average Risk     3.4          3.3     Average Risk          5.0          4.4     2X Average Risk          9.6          7.1     3X Average Risk          15.0          11.0                           Tests: (2) Hepatic/Liver Function Panel (HEPATIC)   Total Bilirubin           0.9 mg/dL                   9.5-2.8   Direct Bilirubin          0.1 mg/dL                   4.1-3.2   Alkaline Phospatase       96 U/L                      39-117   AST  24 U/L                      0-37   ALT                       18 U/L                      0-35   Total Protein             6.2 g/dL                    1.6-1.0   Albumin                   3.8 g/dL                    9.6-0.4 Tests: (1) Valproic Acid (Depakene) (54098)  Valproic Acid (Depakene)                        [L]  <1.0 ug/mL                  50.0-100.0

## 2010-06-16 NOTE — Progress Notes (Signed)
Summary: BENICAR  Phone Note Call from Patient Call back at Home Phone [phone]   Caller: [phone] OK VM  Summary of Call: See previous phone note. Pt returned call today. Pt says a couple times a week her bp becomes elevated (ex, 170/98). She says she knows that it is elevated when she "hears her pulse in her ears". Pt takes another benicar when her bp is elevated and says that it comes down to normal.  Initial call taken by: Lamar Sprinkles, CMA,  January 09, 2009 10:00 AM  Follow-up for Phone Call        increase to benicare/hct  40/12.5. May come by for samples Follow-up by: Jacques Navy MD,  January 09, 2009 1:17 PM  Additional Follow-up for Phone Call Additional follow up Details #1::        called pt and she states actually is in the building currently so will come get samples. Also [facility] Additional Follow-up by: Windell Norfolk,  January 09, 2009 2:38 PM    New/Updated Medications: BENICAR HCT 40-12.5 MG TABS (OLMESARTAN MEDOXOMIL-HCTZ) 1 by mouth once daily Prescriptions: BENICAR HCT 40-12.5 MG TABS (OLMESARTAN MEDOXOMIL-HCTZ) 1 by mouth once daily  #30 x 5   Entered by:   Windell Norfolk   Authorized by:   Jacques Navy MD   Signed by:   Windell Norfolk on 01/09/2009   Method used:   Electronically to        [facility]. [phone]* (retail)       [address]       [place], [place]  [address]       Ph: [phone]       Fax: [phone]   RxID:   [id number]

## 2010-06-16 NOTE — Assessment & Plan Note (Signed)
Summary: FU ON MEDS /NWS   Vital Signs:  Patient Profile:   60 Years Old Female Height:     66 inches Weight:      143 pounds Temp:     97.4 degrees F oral Pulse rate:   56 / minute BP sitting:   120 / 70  (left arm) Cuff size:   regular  Vitals Entered By: Sydnee Levans, SMA                 PCP:  [facility]  Chief Complaint:  Medication check.  History of Present Illness: Medication check: brought in for question of higher dose of valium.  She reports that she has been having a lot mood swings, including suicidal ideation. She saw Dr. Tomasa Rand at [facility] - diagnosed as bipolar!Marland Kitchen She was started on depakote and lamictal. She no longer is taking valium. She will continue to see Dr. Dellia Cloud for talk therapy. She has a positive attitude in regard to this new diagnosis and believes that she is doing better and will continue to do better.   She reports sleep difficulty. Medication prescribing deferred to Dr. Tomasa Rand, and patient agrees with having a single prescriber for psychotropic medications.  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 On minocycyline.    Updated Prior Medication List: MULTIVITAMINS   TABS (MULTIPLE VITAMIN) by mouth once daily ESTRADIOL 1 MG  TABS (ESTRADIOL) once daily ACIPHEX 20 MG TBEC (RABEPRAZOLE SODIUM) 1 once daily ALEVE 220 MG  TABS (NAPROXEN SODIUM) by mouth two times a day ALLEGRA-D 24 HOUR 180-240 MG  TB24 (FEXOFENADINE-PSEUDOEPHEDRINE) by mouth as needed BENICAR HCT 20-12.5 MG  TABS (OLMESARTAN MEDOXOMIL-HCTZ) 1 by mouth once daily LIPITOR 20 MG TABS (ATORVASTATIN CALCIUM) 1 by mouth qam XANAX 0.25 MG  TABS (ALPRAZOLAM) by mouth as needed TYLENOL 8 HOUR 650 MG  TBCR (ACETAMINOPHEN) 2 by mouth two times a day NASONEX 50 MCG/ACT  SUSP (MOMETASONE FUROATE) 1 spray each nostril as needed LAMICTAL 25 MG TABS (LAMOTRIGINE) take as directed DEPAKOTE ER 500 MG XR24H-TAB (DIVALPROEX SODIUM) take 2 tablets at bedtime    Past Medical  History:    Reviewed history from 03/04/2007 and no changes required:       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/o       BIPLR I MOST RECENT EPIS DPRS SEV NO PSYCHOT BHV (ICD-296.53)-Sept '09       HYPERLIPIDEMIA (ICD-272.4)       CAESAREAN SECTION, HX OF (ICD-V39.01)       IRRITABLE BOWEL SYNDROME, HX OF (ICD-V12.79)       GERD (ICD-530.81)       ALLERGIC RHINITIS (ICD-477.9)         Past Surgical History:    Reviewed history from 03/04/2007 and no changes required:       CAESAREAN SECTION, HX OF (ICD-V39.01)   Family History:    Reviewed history from 06/16/2007 and no changes required:       father- CAD/MI-PCI/Stnet '03       mother- plobothrombosis, anxiety       MGM- DM       Neg- breast, colon cancer  Social History:    Reviewed history from 06/16/2007 and no changes required:       married 4 years - divorced; married 10 years - divorced. Serially monogamous relationships.       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  The patient denies anorexia, weight loss, weight gain, chest pain, dyspnea on exertion, prolonged cough, abdominal pain, muscle weakness, difficulty walking, abnormal bleeding, and enlarged lymph nodes.     Physical Exam  General:     alert, well-developed, well-nourished, well-hydrated, appropriate dress, and normal appearance.   Head:     normocephalic and atraumatic.   Eyes:     vision grossly intact, pupils equal, pupils round, corneas and lenses clear, and no injection.   Lungs:     normal respiratory effort and no wheezes.     Heart:     normal rate and regular rhythm.   Psych:     Oriented X3, normally interactive, good eye contact, and not anxious appearing.  Mildly flat affect.    Impression & Recommendations:  Problem # 1:  BIPLR I MOST RECENT EPIS DPRS SEV NO PSYCHOT BHV (ICD-296.53) Newly diagnuosed: in retrospect this explains many past behaviors and relational problems. She self reports recent severe depression with suicidal ideation. She denies persistence of thoughts of self-harm. She is accepting of this diagnosis and has a positive attitude about the portential benefits of medication. She will discuss sleep issues and potential medications with Dr. Tomasa Rand. She will continue to see Dr. Dellia Cloud.  Complete Medication List: 1)  Multivitamins Tabs (Multiple vitamin) .... By mouth once daily 2)  Estradiol 1 Mg Tabs (Estradiol) .... Once daily 3)  Aciphex 20 Mg Tbec (Rabeprazole sodium) .Marland Kitchen.. 1 once daily 4)  Aleve 220 Mg Tabs (Naproxen sodium) .... By mouth two times a day 5)  Allegra-d 24 Hour 180-240 Mg Tb24 (Fexofenadine-pseudoephedrine) .... By mouth as needed 6)  Benicar Hct 20-12.5 Mg Tabs (Olmesartan medoxomil-hctz) .Marland Kitchen.. 1 by mouth once daily 7)  Lipitor 20 Mg Tabs (Atorvastatin calcium) .Marland Kitchen.. 1 by mouth qam 8)  Xanax 0.25 Mg Tabs (Alprazolam) .... By mouth as needed 9)  Tylenol 8 Hour 650 Mg Tbcr (Acetaminophen) .... 2 by mouth two times a day 10)  Nasonex 50 Mcg/act Susp (Mometasone furoate) .Marland Kitchen.. 1 spray each nostril as needed 11)  Lamictal 25 Mg Tabs (Lamotrigine) .... Take as directed 12)  Depakote Er 500 Mg Xr24h-tab (Divalproex sodium) .... Take 2 tablets at bedtime  Other Orders: Admin 1st Vaccine ([address]) Flu Vaccine 68yrs + ([address])      ]  Flu Vaccine Consent Questions     Do you have a history of severe allergic reactions to this vaccine? no    Any prior history of allergic reactions to egg and/or gelatin? no    Do you have a sensitivity to the preservative Thimersol? no     Do you have a past history of Guillan-Barre Syndrome? no    Do you currently have an acute febrile illness? no    Have you ever had a severe reaction to latex? no    Vaccine information given and explained to patient? yes    Are you currently pregnant? no    Lot Number:[id number]   Site Given  Left Deltoid IM

## 2010-06-16 NOTE — Progress Notes (Signed)
Summary: Refill request  Phone Note Refill Request Message from:  Patient on November 03, 2007 9:40 AM  Refills Requested: Medication #1:  AMBIEN 10 MG  TABS at bedtime as needed  Medication #2:  XANAX 0.25 MG  TABS by mouth as needed  Medication #3:  VALIUM 5 MG  TABS by mouth at bedtime. Initial call taken by: Zackery Barefoot CMA,  November 03, 2007 9:48 AM  Follow-up for Phone Call        reviewed med list. These refills are ok - sme quantities as previously. For alprazolam #90, 2 refills Follow-up by: Jacques Navy MD,  November 04, 2007 3:31 PM      Prescriptions: VALIUM 5 MG  TABS (DIAZEPAM) by mouth at bedtime  #30 x 5   Entered by:   Zackery Barefoot CMA   Authorized by:   Jacques Navy MD   Signed by:   Zackery Barefoot CMA on 11/06/2007   Method used:   Telephoned to ...       [facility] [address]       [address]       [place], [place]  [address]       Ph: [phone]       Fax: [phone]   RxID:   [id number] Prudy Feeler 0.25 MG  TABS (ALPRAZOLAM) by mouth as needed  #90 x 2   Entered by:   Zackery Barefoot CMA   Authorized by:   Jacques Navy MD   Signed by:   Zackery Barefoot CMA on 11/06/2007   Method used:   Telephoned to ...       [facility] [address]       [address]       [place], [place]  [address]       Ph: [phone]       Fax: [phone]   RxID:   [id number] AMBIEN 10 MG  TABS (ZOLPIDEM TARTRATE) at bedtime as needed  #30 x 5   Entered by:   Zackery Barefoot CMA   Authorized by:   Jacques Navy MD   Signed by:   Zackery Barefoot CMA on 11/06/2007   Method used:   Telephoned to ...       [facility] [address]       [address]       [place], [place]  [address]       Ph: [phone]       Fax: [phone]   RxID:   [id number] NASONEX 50 MCG/ACT  SUSP (MOMETASONE FUROATE) 1 spray each nostril as needed  #1 x 5  Entered by:   Zackery Barefoot CMA   Authorized by:   Jacques Navy MD   Signed by:   Zackery Barefoot CMA on 11/03/2007   Method used:   Electronically sent to ...       [facility] [address]       [address]       [place], [place]  [address]       Ph: [phone]       Fax: [phone]   RxID:   [id number] SIMVASTATIN 40 MG  TABS (SIMVASTATIN) 1 once daily  #30 x 5   Entered by:   Zackery Barefoot CMA   Authorized  by:   Jacques Navy MD   Signed by:   Zackery Barefoot CMA on 11/03/2007   Method used:   Electronically sent to ...       [facility] [address]       [address]       [place], [place]  [address]       Ph: [phone]       Fax: [phone]   RxID:   [id number] BENICAR HCT 20-12.5 MG  TABS (OLMESARTAN MEDOXOMIL-HCTZ) 1 by mouth once daily  #30 x 5   Entered by:   Zackery Barefoot CMA   Authorized by:   Jacques Navy MD   Signed by:   Zackery Barefoot CMA on 11/03/2007   Method used:   Electronically sent to ...       [facility] [address]       [address]       [place], [place]  [address]       Ph: [phone]       Fax: [phone]   RxID:   [id number] OMEPRAZOLE 20 MG CPDR (OMEPRAZOLE) 2 once daily  #60 x 5   Entered by:   Zackery Barefoot CMA   Authorized by:   Jacques Navy MD   Signed by:   Zackery Barefoot CMA on 11/03/2007   Method used:   Electronically sent to ...       [facility] [address]       [address]       [place], [place]  [address]       Ph: [phone]       Fax: [phone]   RxID:   [id number] ESTRADIOL 1 MG  TABS (ESTRADIOL) once daily  #30 x 5   Entered by:   Zackery Barefoot CMA   Authorized by:   Jacques Navy MD   Signed by:   Zackery Barefoot CMA on 11/03/2007   Method used:   Electronically sent to ...       [facility] [address]       [address]       [place], [place]  [address]       Ph: [phone]       Fax: [phone]   RxID:   [id number] EFFEXOR XR 150 MG CP24 (VENLAFAXINE HCL) Take 1 tablet by mouth once a day  #30 x 5   Entered by:   Zackery Barefoot CMA   Authorized by:   Jacques Navy MD   Signed by:   Zackery Barefoot CMA on 11/03/2007   Method used:   Electronically sent to ...       [facility] [address]       [address]       [place], [place]  [address]       Ph: [phone]       Fax: [phone]   RxID:   [id number]

## 2010-06-16 NOTE — Assessment & Plan Note (Signed)
Summary: sore throat/requested monday/cd   Vital Signs:  Patient profile:   60 year old female Height:      66 inches Weight:      140 pounds BMI:     22.68 O2 Sat:      97 % on Room air Temp:     97.1 degrees F oral Pulse rate:   66 / minute BP sitting:   116 / 72  (left arm) Cuff size:   regular  Vitals Entered By: Bill Salinas CMA (October 27, 2009 11:37 AM)  O2 Flow:  Room air CC: pt here with c/o sore throat with cough x 2 months pt no longer takes aciphex, benicare,wellbutrin,ambien/ ab   Primary Care Provider:  Smokey Melott  CC:  pt here with c/o sore throat with cough x 2 months pt no longer takes aciphex, benicare, wellbutrin, and ambien/ ab.  History of Present Illness: Patient presents for sore throat that has persisted for several months on an intermittent basis. She will have coughing with this. She still has her tonsils. She admits to trouble with swallow. No night sweats, no weight loss. No ear pain. Cough is non-productive. She does have a post-nasal drip.   Current Medications (verified): 1)  Multivitamins   Tabs (Multiple Vitamin) .... By Mouth Once Daily 2)  Estradiol 1 Mg  Tabs (Estradiol) .... Once Daily 3)  Aciphex 20 Mg Tbec (Rabeprazole Sodium) .Marland Kitchen.. 1 Once Daily 4)  Aleve 220 Mg  Tabs (Naproxen Sodium) .... By Mouth Two Times A Day 5)  Benicar Hct 40-12.5 Mg Tabs (Olmesartan Medoxomil-Hctz) .Marland Kitchen.. 1 By Mouth Once Daily 6)  Simvastatin 40 Mg Tabs (Simvastatin) .Marland Kitchen.. 1 By Mouth Q D 7)  Xanax Xr 1 Mg Xr24h-Tab (Alprazolam) .... Take 1/2 Tablet By Mouth Once A Day 8)  Lamictal 100 Mg Tabs (Lamotrigine) .... 150mg  Daily 9)  Wellbutrin Xl 300 Mg Xr24h-Tab (Bupropion Hcl) .... Take 1 Tablet By Mouth Once A Day 10)  Abilify 5 Mg Tabs (Aripiprazole) .... Take 1 Tablet By Mouth Once A Day 11)  Ambien 10 Mg Tabs (Zolpidem Tartrate) .... Take 1 Tab By Mouth At Bedtime 12)  Prozac 40 Mg Caps (Fluoxetine Hcl) .Marland Kitchen.. 1 Cap Daily  Allergies (verified): 1)  ! Codeine  Past  History:  Past Medical History: Last updated: 06/14/2008 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/o BIPLR I MOST RECENT EPIS DPRS SEV NO PSYCHOT BHV (ICD-296.53)-Sept '09 HYPERLIPIDEMIA (ICD-272.4) IRRITABLE BOWEL SYNDROME, HX OF (ICD-V12.79) GERD (ICD-530.81) ALLERGIC RHINITIS (ICD-477.9)  Past Surgical History: Last updated: 03/04/2007 CAESAREAN SECTION, HX OF (ICD-V39.01)  Family History: Last updated: 06/16/2007 father- CAD/MI-PCI/Stnet '03 mother- plobothrombosis, anxiety MGM- DM Neg- breast, colon cancer  Social History: Last updated: 10/27/2009 married 4 years - divorced; married 10 years - divorced. Serially monogamous relationships. 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 Serially monogamous relationships. 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  The patient denies anorexia, fever, weight loss, chest pain, dyspnea on exertion, and  abdominal pain.    Physical Exam  General:  Well-developed,well-nourished,in no acute distress; alert,appropriate and cooperative throughout examination Head:  Normocephalic and atraumatic without obvious abnormalities. No apparent alopecia or balding. Mouth:  oral pharynx without lesions. Tonsils present - normal size, no exudate. Easily palpable and enlarged left submandibular salivary gland. Neck:  supple and full ROM.   Lungs:  normal respiratory effort and normal breath sounds.   Heart:  normal rate  and regular rhythm.     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 No evidence of infection. No signs to suggest malignancy.  Plan - use of lemon drops or sour balls at first sign of swelling.           for lack of response, high frequency of occurence, change in texture of gland, weight loss or any other symptoms will refer to               Dr. Lazarus Salines  Complete Medication List: 1)  Multivitamins Tabs (Multiple vitamin) .... By mouth once daily 2)  Estradiol 1 Mg Tabs (Estradiol) .... Once daily 3)  Aciphex 20 Mg Tbec (Rabeprazole sodium) .Marland Kitchen.. 1 once daily 4)  Aleve 220 Mg Tabs (Naproxen sodium) .... By mouth two times a day 5)  Benicar Hct 40-12.5 Mg Tabs (Olmesartan medoxomil-hctz) .Marland Kitchen.. 1 by mouth once daily 6)  Simvastatin 40 Mg Tabs (Simvastatin) .Marland Kitchen.. 1 by mouth q d 7)  Xanax Xr 1 Mg Xr24h-tab (Alprazolam) .... Take 1/2 tablet by mouth once a day 8)  Lamictal 100 Mg Tabs (Lamotrigine) .... 150mg  daily 9)  Wellbutrin Xl 300 Mg Xr24h-tab (Bupropion hcl) .... Take 1 tablet by mouth once a day 10)  Abilify 5 Mg Tabs (Aripiprazole) .... Take 1 tablet by mouth once a day 11)  Ambien 10 Mg Tabs (Zolpidem tartrate) .... Take 1 tab by mouth at bedtime 12)  Prozac 40 Mg Caps (Fluoxetine hcl) .Marland Kitchen.. 1 cap daily

## 2010-06-16 NOTE — Letter (Signed)
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

## 2010-06-16 NOTE — Progress Notes (Signed)
Summary: BP MED  Medications Added BENICAR HCT 20-12.5 MG  TABS (OLMESARTAN MEDOXOMIL-HCTZ) 1 by mouth once daily       Phone Note Call from Patient   Summary of Call: Pt's bp was 164/100 and she went to ER last week. She did see cardiologist who suggested that she go back to benicar 20 with the HCTZ.  Initial call taken by: Lamar Sprinkles,  July 28, 2007 2:39 PM  Follow-up for Phone Call        Sounds like a good plan. Does she need samples or was this for information only. Follow-up by: Jacques Navy MD,  July 28, 2007 6:04 PM  Additional Follow-up for Phone Call Additional follow up Details #1::        Pt needs a new rx Additional Follow-up by: Lamar Sprinkles,  July 28, 2007 6:14 PM    Additional Follow-up for Phone Call Additional follow up Details #2::    rx done Follow-up by: Jacques Navy MD,  July 28, 2007 6:37 PM  New/Updated Medications: BENICAR HCT 20-12.5 MG  TABS (OLMESARTAN MEDOXOMIL-HCTZ) 1 by mouth once daily   Prescriptions: BENICAR HCT 20-12.5 MG  TABS (OLMESARTAN MEDOXOMIL-HCTZ) 1 by mouth once daily  #30 x PRN   Entered and Authorized by:   Jacques Navy MD   Signed by:   Jacques Navy MD on 07/28/2007   Method used:   Electronically sent to ...       [id number]       [address] [place], [place]  [address]       Ph: [phone]       Fax: [phone]   RxID:   [id number]

## 2010-06-16 NOTE — Assessment & Plan Note (Signed)
Summary: FU-STC  Medications Added EFFEXOR XR 150 MG CP24 (VENLAFAXINE HCL) Take 1 tablet by mouth once a day BENICAR 20 MG  TABS (OLMESARTAN MEDOXOMIL) 1 once daily NASONEX 50 MCG/ACT  SUSP (MOMETASONE FUROATE) 1 spray each nostril as needed        Vital Signs:  Patient Profile:   60 Years Old Female Height:     66 inches Weight:      140 pounds Temp:     96.7 degrees F oral Pulse rate:   83 / minute BP sitting:   141 / 86  (left arm) Cuff size:   regular  Vitals Entered By: Zackery Barefoot CMA (June 16, 2007 2:06 PM)                 PCP:  [facility]  Chief Complaint:  FOLLOW UP.  History of Present Illness: In the interval left the [facility] system. Working [employer] [place], doing U/[employer] studies. 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 Has had a major impact on her sanity level.   Medically stable. Has seen Clint Young recently.  Has a good relationship going.      Past Medical History:    Reviewed history from 03/04/2007 and no changes required:       * Hx of PHLEBOTHROMBOSIS MOSTLY LEFT LEG       IRRITABLE BOWEL SYNDROME, HX OF (ICD-V12.79)       GERD (ICD-530.81)       DEPRESSION (ICD-311)       ANXIETY (ICD-300.00)       ALLERGIC RHINITIS (ICD-477.9)       Hyperlipidemia  Past Surgical History:    Reviewed history from 03/04/2007 and no changes required:       CAESAREAN SECTION, HX OF (ICD-V39.01)   Family History:    Reviewed history and no changes required:       father- CAD/MI-PCI/Stnet '03       mother- plobothrombosis, anxiety       MGM- DM       Neg- breast, colon cancer  Social History:    Reviewed history and no changes required:       married 4 years - divorced; married 10 years - divorced. Serially monogamous relationships.       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     Physical Exam  General:     Well-developed,well-nourished,in no acute distress; alert,appropriate and cooperative throughout examination Psych:     Oriented X3, normally interactive, good eye contact, not anxious appearing, and not depressed appearing.      Impression & Recommendations:  Problem # 1:  DEPRESSION (ICD-311) Doing very well at this time.  Plan: meds renewed.  Her updated medication list for this problem includes:    Effexor Xr 150 Mg Cp24 (Venlafaxine  hcl) ..... Take 1 tablet by mouth once a day    Xanax 0.25 Mg Tabs (Alprazolam) ..... By mouth as needed    Valium 5 Mg Tabs (Diazepam) ..... By mouth at bedtime   Problem # 2:  ANXIETY (ICD-300.00) Doing much better now that she is in a better work situation at [employer] [place].  Plan: meds renewed.  Her updated medication list for this problem includes:    Effexor Xr 150 Mg Cp24 (Venlafaxine hcl) .Marland Kitchen... Take 1 tablet by mouth once a day    Xanax 0.25 Mg Tabs (Alprazolam) ..... By mouth as needed    Valium 5 Mg Tabs (Diazepam) ..... By mouth at bedtime   Problem # 3:  ALLERGIC RHINITIS (ICD-477.9) Current with Dr. Maple Hudson  The following medications were removed from the medication list:    Astelin 137 Mcg/spray Soln (Azelastine hcl)  Her updated medication list for this problem includes:    Nasonex 50 Mcg/act Susp (Mometasone furoate) .Marland Kitchen... 1 spray each nostril as needed   Complete Medication List: 1)  Effexor Xr 150 Mg Cp24 (Venlafaxine hcl) .... Take 1 tablet by mouth once a  day 2)  Multivitamins Tabs (Multiple vitamin) .... By mouth once daily 3)  Estradiol 1 Mg Tabs (Estradiol) .... Once daily 4)  Ambien 10 Mg Tabs (Zolpidem tartrate) .... At bedtime as needed 5)  Aciphex 20 Mg Tbec (Rabeprazole sodium) .... By mouth once daily 6)  Aleve 220 Mg Tabs (Naproxen sodium) .... By mouth two times a day 7)  Allegra-d 24 Hour 180-240 Mg Tb24 (Fexofenadine-pseudoephedrine) .... By mouth as needed 8)  Benicar 20 Mg Tabs (Olmesartan medoxomil) .Marland Kitchen.. 1 once daily 9)  Lipitor 20 Mg Tabs (Atorvastatin calcium) .... By mouth once daily 10)  Xanax 0.25 Mg Tabs (Alprazolam) .... By mouth as needed 11)  Tylenol 8 Hour 650 Mg Tbcr (Acetaminophen) .... 2 by mouth two times a day 12)  Nasonex 50 Mcg/act Susp (Mometasone furoate) .Marland Kitchen.. 1 spray each nostril as needed 13)  Valium 5 Mg Tabs (Diazepam) .... By mouth at bedtime  Other Orders: TLB-Lipid Panel (80061-LIPID) TLB-Hepatic/Liver Function Pnl (80076-HEPATIC) TLB-BMP (Basic Metabolic Panel-BMET) (80048-METABOL) TLB-CBC Platelet - w/Differential (85025-CBCD) TLB-TSH (Thyroid Stimulating Hormone) (84443-TSH)     Prescriptions: VALIUM 5 MG  TABS (DIAZEPAM) by mouth at bedtime  #120 x 3   Entered and Authorized by:   Jacques Navy MD   Signed by:   Jacques Navy MD on 06/16/2007   Method used:   Print then Give to Patient   RxID:   [id number] LIPITOR 20 MG  TABS (ATORVASTATIN CALCIUM) by mouth once daily  #90 x 3   Entered and Authorized by:   Jacques Navy MD   Signed by:   Jacques Navy MD on 06/16/2007   Method used:   Print then Give to Patient   RxID:   [id number] BENICAR 20 MG  TABS (OLMESARTAN MEDOXOMIL) 1 once daily  #90 x 3   Entered and Authorized by:   Jacques Navy MD   Signed by:   Jacques Navy MD on 06/16/2007   Method used:   Print then Give to Patient   RxID:   [id number] ACIPHEX 20 MG  TBEC (RABEPRAZOLE SODIUM) by mouth once daily  #90 x 3   Entered and Authorized  by:   Jacques Navy MD   Signed by:   Jacques Navy MD on 06/16/2007   Method used:   Print  then Give to Patient   RxID:   [id number] AMBIEN 10 MG  TABS (ZOLPIDEM TARTRATE) at bedtime as needed  #90 x 3   Entered and Authorized by:   Jacques Navy MD   Signed by:   Jacques Navy MD on 06/16/2007   Method used:   Print then Give to Patient   RxID:   [id number] EFFEXOR XR 150 MG CP24 (VENLAFAXINE HCL) Take 1 tablet by mouth once a day  #90 x 3   Entered and Authorized by:   Jacques Navy MD   Signed by:   Jacques Navy MD on 06/16/2007   Method used:   Print then Give to Patient   RxID:   [id number]  ]

## 2010-06-16 NOTE — Assessment & Plan Note (Signed)
Summary: tb test/men/coming at 2:30pm/cd   Nurse Visit   Allergies: 1)  ! Codeine  Immunizations Administered:  PPD Skin Test:    Vaccine Type: PPD    Site: left forearm    Dose: 0.1 ml    Route: ID    Given by: Lamar Sprinkles, CMA    Exp. Date: 11/10/2011    Lot #: [id number]  PPD Results    Date of reading: 02/20/2009    Results: < 5mm    Interpretation: negative  Orders Added: 1)  Admin 1st Vaccine [90471] 2)  Flu Vaccine 39yrs + [45409] 3)  TB Skin Test [86580] 4)  Admin of Any Addtl Vaccine [81191] Flu Vaccine Consent Questions     Do you have a history of severe allergic reactions to this vaccine? no    Any prior history of allergic reactions to egg and/or gelatin? no    Do you have a sensitivity to the preservative Thimersol? no    Do you have a past history of Guillan-Barre Syndrome? no    Do you currently have an acute febrile illness? no    Have you ever had a severe reaction to latex? no    Vaccine information given and explained to patient? yes    Are you currently pregnant? no    Lot Number:[id number]   Exp Date:11/13/2009   Site Given Right Deltoid IMu

## 2010-06-16 NOTE — Assessment & Plan Note (Signed)
Summary: PAIN IN SHOULDERS AND LOWER BACK/ $50 /NWS   Vital Signs:  Patient Profile:   60 Years Old Female Height:     66 inches Weight:      141.50 pounds Temp:     97.0 degrees F oral Pulse rate:   74 / minute BP sitting:   102 / 60  (right arm) Cuff size:   regular  Vitals Entered By: Windell Norfolk (July 03, 2008 2:35 PM)                 PCP:  Norins  Chief Complaint:  muscle pain and weakness.  History of Present Illness: here with symmetric tender areas to numerous areas worsening to now mod to severe over the past 3 to 4 months gradual onset and worsening; tender areas seem to be the bilat humerus area, bilat upper back , mid back, and lower back, as well as bilat groin areas as well; no wt loss, fever, LA, fever or other constitutional symptoms; good compliance with meds; thinks she has FMS as someone else she knows has    Updated Prior Medication List: MULTIVITAMINS   TABS (MULTIPLE VITAMIN) by mouth once daily ESTRADIOL 1 MG  TABS (ESTRADIOL) once daily ACIPHEX 20 MG TBEC (RABEPRAZOLE SODIUM) 1 once daily ALEVE 220 MG  TABS (NAPROXEN SODIUM) by mouth two times a day BENICAR HCT 20-12.5 MG  TABS (OLMESARTAN MEDOXOMIL-HCTZ) 1 by mouth once daily LIPITOR 20 MG TABS (ATORVASTATIN CALCIUM) 1 by mouth qam XANAX 0.25 MG  TABS (ALPRAZOLAM) 1 by mouth two times a day as needed LAMICTAL 25 MG TABS (LAMOTRIGINE) take as directed SAVELLA 50 MG TABS (MILNACIPRAN HCL) 1 by mouth two times a day  Current Allergies (reviewed today): No known allergies   Past Medical History:    Reviewed history from 06/14/2008 and no changes required:       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 Serially monogamous relationships.       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   Head:     Normocephalic and atraumatic without obvious abnormalities. No apparent alopecia or balding. Eyes:     No corneal or conjunctival inflammation noted. EOMI. Perrla.  Ears:     External ear exam shows no significant lesions or deformities.  Otoscopic examination reveals clear canals, tympanic membranes are intact bilaterally without bulging, retraction, inflammation or discharge. Hearing is grossly normal bilaterally. Nose:     External nasal examination shows no deformity or inflammation. Nasal  mucosa are pink and moist without lesions or exudates. Mouth:     Oral mucosa and oropharynx without lesions or exudates.  Teeth in good repair. Neck:     supple and no masses.   Lungs:     Normal respiratory effort, chest expands symmetrically. Lungs are clear to auscultation, no crackles or wheezes. Heart:     normal rate and regular rhythm.   Abdomen:     soft and non-tender.   Msk:     numerous bilat upper arm, upper/mid/lower back tender spots it seems symmetrical without rash, swelling or erythema Extremities:     no edema, no ulcers     Impression & Recommendations:  Problem # 1:  MUSCLE PAIN (ICD-729.1)  The following medications were removed from the medication list:    Tylenol 8 Hour 650 Mg Tbcr (Acetaminophen) .Marland Kitchen... 2 by mouth two times a day  Her updated medication list for this problem includes:    Aleve 220 Mg Tabs (Naproxen sodium) ..... By mouth two times a  day pattern c/w FMS - will refer rheum, also due for yearly f/u with dr Debby Bud, as well as trial of savella -gave starter kit and rx Orders: Rheumatology Referral (Rheumatology)   Problem # 2:  VIRAL INFECTION-UNSPEC (ICD-079.99)  The following medications were removed from the medication list:    Allegra-d 24 Hour 180-240 Mg Tb24 (Fexofenadine-pseudoephedrine) ..... By mouth as needed    Tylenol 8 Hour 650 Mg Tbcr (Acetaminophen) .Marland Kitchen... 2 by mouth two times a day  Her updated medication list for this problem includes:    Aleve 220 Mg Tabs (Naproxen sodium) ..... By mouth two times a day resolved from last visit - pt reassured  Complete Medication List: 1)  Multivitamins Tabs (Multiple vitamin) .... By mouth once daily 2)  Estradiol 1 Mg Tabs (Estradiol) .... Once daily 3)  Aciphex 20 Mg Tbec (Rabeprazole sodium) .Marland Kitchen.. 1 once daily 4)  Aleve 220 Mg Tabs (Naproxen sodium) .... By mouth two times a day 5)  Benicar Hct 20-12.5 Mg Tabs (Olmesartan medoxomil-hctz) .Marland Kitchen.. 1 by mouth once daily 6)  Lipitor 20 Mg Tabs (Atorvastatin calcium) .Marland Kitchen.. 1 by mouth qam 7)  Xanax 0.25 Mg Tabs (Alprazolam) .Marland Kitchen.. 1 by mouth two times a day as needed 8)  Lamictal 25 Mg Tabs (Lamotrigine) .... Take as directed 9)  Savella 50 Mg Tabs (Milnacipran hcl) .Marland Kitchen.. 1 by mouth two times a day   Patient Instructions: 1)  please start the savella starter kit, then the prescription 2)  Continue all medications that you may have been taking previously  3)  You will be contacted about the referral(s) to: Rheumatology 4)  Please schedule an appointment with your primary doctor for next available Physical   Prescriptions: SAVELLA 50 MG TABS (MILNACIPRAN HCL) 1 by mouth two times a day  #60 x 11   Entered and Authorized by:   Corwin Levins MD   Signed by:   Corwin Levins MD on 07/03/2008   Method used:   Print then Give to Patient   RxID:   [id number]

## 2010-06-16 NOTE — Medication Information (Signed)
Summary: Prior Authorization for Lipitor & Aciphex/[facility]  Prior Authorization for Lipitor & Aciphex/[facility]   Imported By: Esmeralda Links D'jimraou 08/07/2007 15:32:14  _____________________________________________________________________  External Attachment:    Type:   Image     Comment:   External Document

## 2010-06-16 NOTE — Assessment & Plan Note (Signed)
Summary: INJECTIONS   Nurse Visit   Allergies: 1)  ! Codeine  Immunizations Administered:  Tetanus Vaccine:    Vaccine Type: Tdap    Site: left deltoid    Mfr: GlaxoSmithKline    Dose: 0.5 ml    Route: IM    Given by: Rock Nephew CMA    Exp. Date: 07/12/2011    Lot #: [id number]    VIS given: 04/04/07 version given June 19, 2009.  MMR Vaccine # 1:    Vaccine Type: MMR    Site: right deltoid    Mfr: Merck    Dose: 0.5 ml    Route: IM    Given by: Rock Nephew CMA    Exp. Date: 02/07/2010    Lot #: [id number]    VIS given: 07/28/06 version given June 19, 2009.  Orders Added: 1)  Tdap => 42yrs IM [90715] 2)  Admin 1st Vaccine [90471] 3)  MMR Vaccine SQ [90707] 4)  Admin of Any Addtl Vaccine [14782]

## 2010-06-16 NOTE — Letter (Signed)
Summary: Out of Work  [facility]  [address] [place], [place] [address]   Phone: [phone]  Fax: [phone]    June 14, 2008   Employee:  [name]    To Whom It May Concern:   For Medical reasons, please excuse the above named employee from work for the following dates:  Start:   Jun 14, 2008  End:   Jun 16, 2008   ---  to return to work feb 1 , 2010  If you need additional information, please feel free to contact our office.         Sincerely,    Corwin Levins MD

## 2010-06-16 NOTE — Progress Notes (Signed)
Summary: ALT MEDS  Medications Added OMEPRAZOLE 20 MG CPDR (OMEPRAZOLE) 2 once daily SIMVASTATIN 40 MG  TABS (SIMVASTATIN) 1 once daily       Phone Note From Pharmacy   Summary of Call: Lipitor and Aciphex are both not covered by insurance. Per Dr Debby Bud ok to change to simvastatin 40mg  1 once daily #30 & Omeprazole 20 2 qam # 60 Initial call taken by: Lamar Sprinkles,  August 01, 2007 7:21 PM  Follow-up for Phone Call        Lf mess for pt Follow-up by: Lamar Sprinkles,  August 01, 2007 7:22 PM    New/Updated Medications: OMEPRAZOLE 20 MG CPDR (OMEPRAZOLE) 2 once daily SIMVASTATIN 40 MG  TABS (SIMVASTATIN) 1 once daily   Prescriptions: SIMVASTATIN 40 MG  TABS (SIMVASTATIN) 1 once daily  #30 x 6   Entered by:   Lamar Sprinkles   Authorized by:   Jacques Navy MD   Signed by:   Lamar Sprinkles on 08/01/2007   Method used:   Electronically sent to ...       [id number]       [address] [place], [place]  [address]       Ph: [phone]       Fax: [phone]   RxID:   [id number] OMEPRAZOLE 20 MG CPDR (OMEPRAZOLE) 2 once daily  #60 x 6   Entered by:   Lamar Sprinkles   Authorized by:   Jacques Navy MD   Signed by:   Lamar Sprinkles on 08/01/2007   Method used:   Electronically sent to ...       [facility]  [id number]*       [address] [place], [place]  [address]       Ph: [phone]       Fax: [phone]   RxID:   [id number]

## 2010-06-16 NOTE — Progress Notes (Signed)
Summary: Benicar  Phone Note Call from Patient Call back at [facility] Phone [phone]   Summary of Call: Pt was on benicar 40/12.5 but it was reduced to 20/12.5. Pt now thinks med is "not enough" and is req higher dosage. She wants to be able to take the 20mg  bid. Need to call for more info.  Initial call taken by: Lamar Sprinkles,  January 02, 2009 12:05 PM  Follow-up for Phone Call        left mess to call office back for more info........................................Marland KitchenLamar Sprinkles  January 02, 2009 3:20 PM   Additional Follow-up for Phone Call Additional follow up Details #1::        benicar is not a two times a day drug. If her BP is runing high then she should resume benicar/hct 40/12.5. It is ok to take 2 benicar/hct 20/12.5 to finish her current supply. She may have samples ($$  may be a problem) Additional Follow-up by: Jacques Navy MD,  January 03, 2009 6:14 AM    Additional Follow-up for Phone Call Additional follow up Details #2::    left mess to call office back ................................Marland KitchenLamar Sprinkles  January 03, 2009 4:26 PM    Called pt again still no ansew [facility] Follow-up by: Orlan Leavens,  January 06, 2009 3:53 PM  Additional Follow-up for Phone Call Additional follow up Details #3:: Details for Additional Follow-up Action Taken: Called pt again still no ansew [facility] MD recommendations concerning benicar Additional Follow-up by: Orlan Leavens,  January 07, 2009 4:02 PM

## 2010-06-16 NOTE — Assessment & Plan Note (Signed)
Summary: CHILLS, BODY ACHES, NAUSEA, FLU? /NWS   Vital Signs:  Patient Profile:   60 Years Old Female Height:     66 inches Weight:      138.38 pounds Temp:     97.0 degrees F oral Pulse rate:   82 / minute BP sitting:   124 / 68  (right arm) Cuff size:   regular  Vitals Entered By: Windell Norfolk (June 14, 2008 3:35 PM)                 Preventive Care Screening  Last Tetanus Booster:    Date:  05/17/2000    Next Due:  05/2010    Results:  given    PCP:  Norins  Chief Complaint:  stomach ache .  History of Present Illness: here with 3 days onset low grade temp, myalgias, head congestion, nausea without vomiting , general abd pain with crampiness and several loose and watery stools without blood; less apetite but able to keep down fluids; some recurring headache; no chills, GU symptoms, flank pain or hematuria    Updated Prior Medication List: MULTIVITAMINS   TABS (MULTIPLE VITAMIN) by mouth once daily ESTRADIOL 1 MG  TABS (ESTRADIOL) once daily ACIPHEX 20 MG TBEC (RABEPRAZOLE SODIUM) 1 once daily ALEVE 220 MG  TABS (NAPROXEN SODIUM) by mouth two times a day ALLEGRA-D 24 HOUR 180-240 MG  TB24 (FEXOFENADINE-PSEUDOEPHEDRINE) by mouth as needed BENICAR HCT 20-12.5 MG  TABS (OLMESARTAN MEDOXOMIL-HCTZ) 1 by mouth once daily LIPITOR 20 MG TABS (ATORVASTATIN CALCIUM) 1 by mouth qam XANAX 0.25 MG  TABS (ALPRAZOLAM) 1 by mouth two times a day as needed TYLENOL 8 HOUR 650 MG  TBCR (ACETAMINOPHEN) 2 by mouth two times a day NASONEX 50 MCG/ACT  SUSP (MOMETASONE FUROATE) 1 spray each nostril as needed LAMICTAL 25 MG TABS (LAMOTRIGINE) take as directed PROMETHAZINE HCL 25 MG TABS (PROMETHAZINE HCL) 1 by mouth q 6 hrs as needed nausea LOMOTIL 2.5-0.025 MG TABS (DIPHENOXYLATE-ATROPINE) 1 by mouth as needed loose stool -max 8 tabs per day  Current Allergies (reviewed today): No known allergies   Past Medical History:    Reviewed history from 02/14/2008 and no changes  required:       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 Serially monogamous relationships.       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  , mild ill  Head:     Normocephalic and atraumatic without obvious abnormalities. No apparent alopecia or balding. Eyes:  No corneal or conjunctival inflammation noted. EOMI. Perrla. Ears:     bilat tm's red, sinus nontender Nose:     nasal dischargemucosal pallor and mucosal erythema.   Mouth:     pharyngeal erythema and fair dentition.   Neck:     supple and no masses.   Lungs:     Normal respiratory effort, chest expands symmetrically. Lungs are clear to auscultation, no crackles or wheezes. Heart:     Normal rate and regular rhythm. S1 and S2 normal without gallop, murmur, click, rub or other extra sounds. Abdomen:     diffuse mild tender, pos BS, no masses, no guarding, and no rebound tenderness.   Msk:     no joint tenderness and no joint swelling.   Extremities:     no edema, no ulcers     Impression & Recommendations:  Problem # 1:  VIRAL INFECTION-UNSPEC (ICD-079.99)  Her updated medication list for this problem includes:    Aleve 220 Mg Tabs (Naproxen sodium) ..... By mouth two times a day    Allegra-d 24 Hour  180-240 Mg Tb24 (Fexofenadine-pseudoephedrine) ..... By mouth as needed    Tylenol 8 Hour 650 Mg Tbcr (Acetaminophen) .Marland Kitchen... 2 by mouth two times a day mild to mod, tx with antiemetic and as needed antidiarrheal, and above, pt educated, reassured, to drink lots of fluids , f/u any worsening s/s  Complete Medication List: 1)  Multivitamins Tabs (Multiple vitamin) .... By mouth once daily 2)  Estradiol 1 Mg Tabs (Estradiol) .... Once daily 3)  Aciphex 20 Mg Tbec (Rabeprazole sodium) .Marland Kitchen.. 1 once daily 4)  Aleve 220 Mg Tabs (Naproxen sodium) .... By mouth two times a day 5)  Allegra-d 24 Hour 180-240 Mg Tb24 (Fexofenadine-pseudoephedrine) .... By mouth as needed 6)  Benicar Hct 20-12.5 Mg Tabs (Olmesartan medoxomil-hctz) .Marland Kitchen.. 1 by mouth once daily 7)  Lipitor 20 Mg Tabs (Atorvastatin calcium) .Marland Kitchen.. 1 by mouth qam 8)  Xanax 0.25 Mg Tabs (Alprazolam) .Marland Kitchen.. 1 by mouth two times a day as needed 9)  Tylenol 8 Hour 650 Mg Tbcr (Acetaminophen) .... 2 by mouth two times a day 10)  Nasonex 50 Mcg/act Susp (Mometasone furoate) .Marland Kitchen.. 1 spray each nostril as needed 11)  Lamictal 25 Mg Tabs (Lamotrigine) .... Take as directed 12)  Promethazine Hcl 25 Mg Tabs (Promethazine hcl) .Marland Kitchen.. 1 by mouth q 6 hrs as needed nausea 13)  Lomotil 2.5-0.025 Mg Tabs (Diphenoxylate-atropine) .Marland Kitchen.. 1 by mouth as needed loose stool -max 8 tabs per day  Other Orders: H1N1 vaccine G code (E4540) Influenza A (H1N1) adm  fee Medicare/Non Medicare [phone])   Patient Instructions: 1)  Please take all new medications as prescribed 2)  Continue all medications that you may have been taking previously 3)  drink lots of fluids 4)  Please schedule a follow-up appointment in 6 months with CPX labs with primary MD   Prescriptions: LOMOTIL 2.5-0.025 MG TABS (DIPHENOXYLATE-ATROPINE) 1 by mouth as needed loose stool -max 8 tabs per day  #40 x 1   Entered and Authorized by:   Corwin Levins MD   Signed by:   Corwin Levins MD on 06/14/2008    Method used:   Print then Give to Patient   RxID:   [id number] PROMETHAZINE HCL 25 MG TABS (PROMETHAZINE HCL) 1 by mouth q 6 hrs as needed nausea  #60 x 1   Entered and Authorized by:   Corwin Levins MD   Signed by:  Corwin Levins MD on 06/14/2008   Method used:   Print then Give to Patient   RxID:   [id number] Prudy Feeler 0.25 MG  TABS (ALPRAZOLAM) 1 by mouth two times a day as needed  #60 x 2   Entered and Authorized by:   Corwin Levins MD   Signed by:   Corwin Levins MD on 06/14/2008   Method used:   Print then Give to Patient   RxID:   [id number]    Flu Vaccine Consent Questions    Do you have a history of severe allergic reactions to this vaccine? no    Any prior history of allergic reactions to egg and/or gelatin? no    Do you have a sensitivity to the preservative Thimersol? no    Do you have a past history of Guillan-Barre Syndrome? no    Do you currently have an acute febrile illness? no    Have you ever had a severe reaction to latex? no    Vaccine information given and explained to patient? yes    Are you currently pregnant? no  H1N1 # 1    Vaccine Type: H1N1 vaccine G code    Site: right deltoid    Mfr: novartis    Dose: 0.5 ml    Route: IM    Given by: Windell Norfolk    Exp. Date: 09/13/2008    Lot #: [id number]

## 2010-06-16 NOTE — Letter (Signed)
Summary: [facility]  [facility]   Imported By: Esmeralda Links D'jimraou 05/31/2008 17:03:57  _____________________________________________________________________  External Attachment:    Type:   Image     Comment:   External Document

## 2010-06-18 ENCOUNTER — Encounter: Payer: Self-pay | Admitting: Internal Medicine

## 2010-06-20 ENCOUNTER — Encounter: Payer: Self-pay | Admitting: Obstetrics and Gynecology

## 2010-07-02 NOTE — Letter (Signed)
Summary: [facility]  [facility]   Imported By: Sherian Rein 06/26/2010 07:35:43  _____________________________________________________________________  External Attachment:    Type:   Image     Comment:   External Document

## 2010-08-23 LAB — CBC
HCT: 30.7 % — ABNORMAL LOW (ref 36.0–46.0)
Hemoglobin: 10.6 g/dL — ABNORMAL LOW (ref 12.0–15.0)
MCHC: 34.7 g/dL (ref 30.0–36.0)
MCV: 89.9 fL (ref 78.0–100.0)
Platelets: 377 10*3/uL (ref 150–400)
RBC: 3.42 MIL/uL — ABNORMAL LOW (ref 3.87–5.11)
RDW: 13.4 % (ref 11.5–15.5)
WBC: 7.6 10*3/uL (ref 4.0–10.5)

## 2010-08-23 LAB — URINALYSIS, ROUTINE W REFLEX MICROSCOPIC
Bilirubin Urine: NEGATIVE
Glucose, UA: NEGATIVE mg/dL
Hgb urine dipstick: NEGATIVE
Ketones, ur: NEGATIVE mg/dL
Nitrite: NEGATIVE
Protein, ur: NEGATIVE mg/dL
Specific Gravity, Urine: 1.013 (ref 1.005–1.030)
Urobilinogen, UA: 0.2 mg/dL (ref 0.0–1.0)
pH: 6 (ref 5.0–8.0)

## 2010-08-23 LAB — POCT I-STAT, CHEM 8
BUN: 17 mg/dL (ref 6–23)
Calcium, Ion: 1.26 mmol/L (ref 1.12–1.32)
Chloride: 95 mEq/L — ABNORMAL LOW (ref 96–112)
Creatinine, Ser: 1.3 mg/dL — ABNORMAL HIGH (ref 0.4–1.2)
Glucose, Bld: 97 mg/dL (ref 70–99)
HCT: 32 % — ABNORMAL LOW (ref 36.0–46.0)
Hemoglobin: 10.9 g/dL — ABNORMAL LOW (ref 12.0–15.0)
Potassium: 4.1 mEq/L (ref 3.5–5.1)
Sodium: 128 mEq/L — ABNORMAL LOW (ref 135–145)
TCO2: 21 mmol/L (ref 0–100)

## 2010-08-23 LAB — DIFFERENTIAL
Basophils Absolute: 0.1 10*3/uL (ref 0.0–0.1)
Basophils Relative: 1 % (ref 0–1)
Eosinophils Absolute: 0.2 10*3/uL (ref 0.0–0.7)
Eosinophils Relative: 2 % (ref 0–5)
Lymphocytes Relative: 22 % (ref 12–46)
Lymphs Abs: 1.7 10*3/uL (ref 0.7–4.0)
Monocytes Absolute: 0.5 10*3/uL (ref 0.1–1.0)
Monocytes Relative: 7 % (ref 3–12)
Neutro Abs: 5.2 10*3/uL (ref 1.7–7.7)
Neutrophils Relative %: 68 % (ref 43–77)

## 2010-08-23 LAB — RAPID URINE DRUG SCREEN, HOSP PERFORMED
Amphetamines: NOT DETECTED
Barbiturates: NOT DETECTED
Benzodiazepines: POSITIVE — AB
Cocaine: NOT DETECTED
Opiates: NOT DETECTED
Tetrahydrocannabinol: NOT DETECTED

## 2010-09-29 NOTE — Assessment & Plan Note (Signed)
[facility]                            CARDIOLOGY OFFICE NOTE   [name], [name]                         MRN:          [id number]  DATE:07/26/2007                            DOB:          [birth year]    PRIMARY CARE PHYSICIAN:  Rosalyn Gess. Norins, MD   REASON FOR PRESENTATION:  Evaluate patient with chest pain.   HISTORY OF PRESENT ILLNESS:  The patient was recently hospitalized on  February 26 with chest discomfort.  She reported that this has been  going on for about a month.  She described chest discomfort that is  substernal.  It happens sporadically.  It goes through to her back.  She  does have some discomfort down into her left arm.  It is been slightly  worse with exertion such as being on an elliptical.  On the day of  admission it was the most intense and the radiation down her left arm  was more noticeable.  She presented to the ER, where the discomfort was  8/10.  There was some associated nausea.  She was very anxious.  She  ruled out for a myocardial infarction and was set up for this outpatient  evaluation.   Of note, the patient did have a stress perfusion study in 2001 which was  normal without evidence of ischemia and with a well-preserved ejection  fraction.  She said the symptoms at that time were different than the  current symptoms.   The patient has continued to have some of this discomfort but not as  severe.  She has been active and cannot necessarily bring it on, though  the she notices if she is having it, it will get worse with exertion.  It is not like previous reflux.  She is not having any PND or orthopnea.  She is not describing any palpitations, presyncope or syncope.  She has  been anxious and under quite a bit of stress, but she has had this her  whole life and manages it.   PAST MEDICAL HISTORY:  1. Hypertension x10 years.  2. Hyperlipidemia x10 years.  3. Depression and anxiety, treated by Dr. Dellia Cloud.  4. Migraines.  5. Irritable bowel syndrome.   PAST SURGICAL HISTORY:  1. C-section.  2. Hysterectomy  3. C-spine fusion.   ALLERGIES:  CODEINE.   MEDICATIONS:  Multivitamin, estradiol, Ambien, Aciphex, Zomig, Aleve,  Effexor 150 mg daily, Benicar 20 mg daily, Lipitor 20 mg daily, Xanax  p.r.n., Tylenol, Nasonex, Valium.   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  She is currently  single and has been divorced about 10 years.  She is two grown children.  She used to work for [employer].  She never smoked cigarettes.  She  occasionally drinks alcohol.   FAMILY HISTORY:  Contributory for her father having myocardial  infarction x2 in his 58s.  He is still alive in his 21s.   REVIEW OF SYSTEMS:  As stated in the HPI and positive for occasional  headaches, dizziness, reflux, joint pains.  Negative for other systems.   PHYSICAL  EXAMINATION:  The patient is pleasant but anxious.  She is in  no distress.  Blood pressure 116/81, heart rate 79 and regular, weight 137 pounds,  body mass index 23.  HEENT:  Eyelids unremarkable.  Pupils equal, round, reactive to light.  Fundi not visualized.  Oral mucosa remarkable.  Neck:  Fundi within  normal limits, oral mucosa normal.  NECK:  No jugular distention at 45 degrees.  Carotid upstroke brisk and  symmetrical.  No bruits, no thyromegaly.  LYMPHATICS:  No cervical, axillary or inguinal adenopathy.  LUNGS:  Clear to auscultation bilaterally.  BACK:  No costovertebral angle tenderness.  CHEST:  Unremarkable.  HEART:  PMI not displaced or sustained, S1 and S2 within normal limits.  No S3 , no S4, no clicks, no rubs, no murmurs.  ABDOMEN:  Obese, positive bowel sounds, normal in frequency and pitch,  no bruits, no rebound, no guarding, no midline pulsatile mass.  No  hepatomegaly, no splenomegaly.  SKIN:  No rashes, no nodules.  EXTREMITIES:  2+ pulses throughout, no edema, no cyanosis, no clubbing.  NEUROLOGIC:  Oriented to place,  time.  Cranial nerves II-XII grossly  intact.  Motor grossly intact.   EKG:  Sinus rhythm, rate 79, axis within normal limits, intervals within  normal limits, no acute ST-T wave change.   ASSESSMENT/PLAN:  1. Chest discomfort.  The patient's chest discomfort has some typical      and some atypical features.  She certainly has cardiovascular risk      factors.  I think the pretest probability of obstructive coronary      disease is at least moderate.  Therefore, screening with exercise      perfusion study is indicated.  2. Hypertension.  The patient's blood pressure is well-controlled and      she will continue medications as listed.  3. Dyslipidemia per Dr. Debby Bud.  The goal would be an LDL less than      100 and HDL greater than 40.  This would be an aggressive therapy      in a patient with risk factors.  4. Anxiety.  She has had careful follow-up of this, and this may be      playing a role.  She will      continue to follow with Dr. Dellia Cloud.  5. Follow-up will be as needed based on the results of the above.Rollene Rotunda, MD, [facility]  Electronically Signed    JH/MedQ  DD: 07/26/2007  DT: 07/28/2007  Job #: [id number]   cc:   Rosalyn Gess. Norins, MD

## 2010-09-29 NOTE — Discharge Summary (Signed)
[name], Grant                  ACCOUNT NO.:  [id number]   MEDICAL RECORD NO.:  [id number]          PATIENT TYPE:  OBV   LOCATION:  3703                         FACILITY:  [facility]   PHYSICIAN:  Valerie A. Felicity Coyer, MDDATE OF BIRTH:  Sep 06, 1950   DATE OF ADMISSION:  07/13/2007  DATE OF DISCHARGE:  07/14/2007                               DISCHARGE SUMMARY   DISCHARGE DIAGNOSIS:  1. Chest pain with shortness of breath and nausea  2. Depression/anxiety.  3. Allergic rhinitis.  4. Asthma.  5. Hyperlipidemia.  6. Irritable bowel syndrome.   HISTORY OF PRESENT ILLNESS:  [name] is a 60 year old female admitted  July 13, 2007, with chief complaint of substernal chest pain and  shortness of breath.  She noted chest pressure to be 8/10 started at  11:30 a.m. on the day of admission about 15 minutes after onset.  She  developed left arm pain.  She also reported nausea which started around  12:15.  The patient's chest pain improved to a 2/10.  She noted that she  had a lot of stress with her new job.  She was admitted for further  evaluation and treatment.   PAST MEDICAL HISTORY:  1. Hyperlipidemia.  2. Irritable bowel syndrome.  3. GERD.  4. Depression.  5. Anxiety.  6. History of phlebitis.  7. Allergic rhinitis.  8. Mild exercise-induced asthma.   COURSE OF HOSPITALIZATION.:  #1 - CHEST PAIN.  The patient's cardiac  enzymes have been negative thus far.  She has one additional set pending  this morning which we anticipate will be negative if enzymes are  negative.  We are going to discharge the patient to home with outpatient  follow-up with Cardiology.  They will arrange Cardiolite in the office.  The patient did have a negative D-dimer.  Chest x-ray showed no active  disease.  Her symptoms are currently resolved.   FOLLOW UP:  The patient is to follow up at [facility] cardiology with Dr.  Antoine Poche on March 11 at 3:30 p.m. she is also instructed to follow up  with Dr.  Illene Regulus in 1-2 weeks and contact the office for an  appointment.  She is instructed however to return should she develop  recurrent chest pain or shortness of breath to the ED.   PERTINENT LABORATORY DATA:  At time of discharge, BUN 19, creatinine  1.1.  Cardiac enzymes negative x3 D-dimer 0.28.      Sandford Craze, NP      Raenette Rover. Felicity Coyer, MD  Electronically Signed    MO/MEDQ  D:  07/14/2007  T:  07/15/2007  Job:  [id number]   cc:   Rollene Rotunda, MD, Simone Curia. Norins, MD

## 2010-10-02 NOTE — Op Note (Signed)
[name], [name]                  ACCOUNT NO.:  [id number]   MEDICAL RECORD NO.:  [id number]          PATIENT TYPE:  INP   LOCATION:  2899                         FACILITY:  [facility]   PHYSICIAN:  Hilda Lias, M.D.   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   PROCEDURE:  Anterior C3-C4, C4-C5, C5-C6, C6-C7 diskectomy, decompression of  the spinal cord, interbody fusion, microscope.   SURGEON:  Hilda Lias, M.D.   ASSISTANT:  Coletta Memos, M.D.   CLINICAL HISTORY:  The patient was admitted because of neck pain with  radiation to the left upper extremity associated with weakness of the  biceps.  The patient states every time she moves her neck, the pain gets  worse up to the point that she has a completely frozen neck.  X-ray showed  spondylolisthesis with degenerative disc disease at the level of C3-C4, C4-  C5, C5-C6, and borderline C6-C7.  The patient wanted to proceed with surgery  and the risks were explained during the history and physical.   PROCEDURE:  The patient was taken to the OR and after intubation, the neck  was prepped.  A transverse incision was made and dissection was carried down  to the cervical area.  X-ray showed that we were at the level of 4-5.  From  then on, we opened the anterior ligament at the level of 3-4, 4-5, 5-6, and  6-7.  The patient had quite a bit of spondylosis in that area with  osteophyte that we removed prior to opening the ligament because of  calcification of the ligament, itself.  We started at the level of C3-C4,  which indeed, she has degenerative disc disease with almost complete  foraminal narrowing.  Decompression of the  spinal cord as well as the  foramen was done.  The same procedure was done at the level of 4-5, which  there was about a 2 mm step off between 4-5.  At the level of 5-6, it was  the worse place, the patient has almost no space whatsoever and to enter the  disc space, we had to drill.  At the end, we had a good decompression with  removal of the osteophyte and decompression of the foramen and the disc  space.  Then, the neck despite having a pillow on the shoulder, still was  flat, we went ahead and removed the anterior osteophyte to the level of C6-  C7, and we entered the disc space and discectomy was accomplished.  So,  immediately, the cervical spine started becoming normal lordotic.  Having  good decompression, we introduced two different type of grafts, 6 mm  lordotic between 3-4 and 4-5 and 7 mm between 5-6 and 6-7.  Inside the bone  graft, we have autograft.  This was followed by a plate.  We had  to select  about four different plates and at the end, we selected one that would fit  the patient perfectly.  A number 10 screw was inserted from C3 down to C7.  Because of  the poor quality of the x-ray, we had to repeat the x-ray at least four  times.  At the end, we had good hemostasis.  Because the area was absolutely  dry, no drain was left.  From then on, the wound was irrigated and closed  with Vicryl and Steri-Strips.           ______________________________  Hilda Lias, M.D.     EB/MEDQ  D:  03/31/2005  T:  03/31/2005  Job:  [id number]

## 2010-10-02 NOTE — Op Note (Signed)
[name], [name]                            ACCOUNT NO.:  [id number]   MEDICAL RECORD NO.:  [id number]                   PATIENT TYPE:  INP   LOCATION:  0002                                 FACILITY:  [facility]   PHYSICIAN:  Sigmund I. Patsi Sears, M.D.         DATE OF BIRTH:  Oct 19, 1950   DATE OF PROCEDURE:  07/04/2003  DATE OF DISCHARGE:                                 OPERATIVE REPORT   PREOPERATIVE DIAGNOSIS:  Stress urinary incontinence.   POSTOPERATIVE DIAGNOSIS:  Stress urinary incontinence.   OPERATION/PROCEDURE:  Mentor transobturator pubovaginal sling.   SURGEON:  Sigmund I. Patsi Sears, M.D.   ANESTHESIA:  General endotracheal.   DESCRIPTION OF PROCEDURE:  Following hysterectomy with the patient in the  dorsal lithotomy position and the pubis prepped with Betadine solution,  Foley catheter was placed and 4 mL of 0.5% plain Marcaine was injected in  the periurethral space.  A 1 cm incision was made over the mid and distal  urethra, subcutaneous tissue dissected with the blunt and sharp dissection.  A mark was made 5 cm lateral to the clitoris and the incisions made over the  obturator fossa.  The transobturator tape was then placed bilaterally, with  correct tensioning with the use or right angle clamps.  Cystoscopy revealed  no evidence of any lateral interruption.  The urethral wound was closed in  two layers with 3-0 Vicryl sutures.  The paravaginal incisions were also  closed.  Sterile dressings were applied.  The patient received IV Toradol,  was taken to the recovery room in good condition.                                               Sigmund I. Patsi Sears, M.D.    SIT/MEDQ  D:  07/04/2003  T:  07/04/2003  Job:  [id number]

## 2010-10-02 NOTE — H&P (Signed)
[name], [name]                  ACCOUNT NO.:  [id number]   MEDICAL RECORD NO.:  [id number]          PATIENT TYPE:  INP   LOCATION:  2899                         FACILITY:  [facility]   PHYSICIAN:  Hilda Lias, M.D.   DATE OF BIRTH:  Apr 03, 1951   DATE OF ADMISSION:  03/31/2005  DATE OF DISCHARGE:                                HISTORY & PHYSICAL   Ms. [name] is a lady who was seen by me in my office complaining of neck pain  with radiation to both upper extremities for a long while.  The pain is  mostly associated with movement.  It is associated with headache and she  gets relief of the pain when she applies some pressure in the neck.  The  pain goes to the shoulder.  Lately she has noticed weakening of the upper  extremity.  She had been seen by orthopedic surgeon who advised about  needing a cervical fusion but she came to [place] because I have been taking care  of her family in the past.   PAST MEDICAL HISTORY:  1.  Cesarean section.  2.  Hysterectomy.  3.  Bladder surgery.   Patient is allergic to CODEINE and MORPHINE.   FAMILY HISTORY:  Unremarkable.   REVIEW OF SYSTEMS:  Positive for sinus headache, sinusitis, high blood  pressure.   PHYSICAL EXAMINATION:  GENERAL:  Patient who came to my office.  She was  quite uncomfortable.  HEENT:  Normal.  NECK:  She is able to flex but extension, lateralization, and bending  produces quite a bit of pain going to the shoulder.  LUNGS:  Clear.  HEART:  Normal.  ABDOMEN:  Normal.  EXTREMITIES:  Normal pulses.  NEUROLOGIC:  She has weakness of both biceps, wrist extensor with a normal  triceps.   Cervical spine x-ray shows severe case of degenerative disk disease with a  lack of lordosis at the level 3-4 with a spondylolisthesis at the level 5-6.   CLINICAL IMPRESSION:  Degenerative disk disease associated with  spondylolisthesis at the level 3-4, 4-5, 5-6.   RECOMMENDATIONS:  The patient is being admitted for surgery.  Procedure  will  be anterior cervical diskectomy and fusion at the level 3-4, 4-5, 5-6.  Patient knows about the risks such as infection, CSF leak, worsening of  pain, paralysis, damage to the vocal cord, stroke, and further surgery would  require posterior approach.           ______________________________  Hilda Lias, M.D.     EB/MEDQ  D:  03/31/2005  T:  03/31/2005  Job:  [id number]

## 2010-10-02 NOTE — Procedures (Signed)
[facility]  Patient:    [name], [name]                         MRN: [id number] Proc. Date: 02/23/00 Adm. Date:  [id number] Attending:  Estella Husk                           Procedure Report  PROCEDURE:  Esophagogastroduodenoscopy.  INDICATION:  Chronic reflux disease.  HISTORY OF PRESENT ILLNESS:  This is a 60 year old female with chronic reflux disease for which she is on proton pump inhibitors.  Symptoms are currently well controlled.  She did report previously some intermittent problems with dysphagia which she currently denies.  She is now for upper endoscopy to exclude Barretts esophagus.  INFORMED CONSENT:  The nature of the procedure as well as the risks, benefits and alternatives were reviewed.  She understood and agreed to proceed.  PHYSICAL EXAMINATION:  GENERAL:  Well-appearing female in no acute distress.  She is alert and oriented.  VITAL SIGNS:  Stable.  LUNGS:  Clear.  HEART:  Regular.  ABDOMEN:  Soft.  DESCRIPTION OF PROCEDURE:  After informed consent was obtained, the patient was sedated with 80 mg of Demerol and 8 mg of Versed IV.  The Olympus endoscope was passed orally under direct vision into the esophagus.  The esophagus revealed intact mucosa throughout.  No evidence of esophagitis or Barretts esophagus.  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  This was benign and large caliber.  The endoscope passed beyond this region without resistance.  The stomach revealed a small sliding hiatal hernia but was otherwise normal.  The duodenal bulb and postbulbar duodenum to the third portion were normal.  IMPRESSION:  Gastroesophageal reflux disease with endoscopic evidence of distal ring/stricture.  RECOMMENDATIONS: 1. Continue Aciphex. 2. Continue reflux precautions. 3. Contact the office for problems, in particular any difficulties with    dysphagia. DD:  02/23/00 TD:  02/24/00 Job:  [id number]

## 2010-10-02 NOTE — Op Note (Signed)
[name], [name]                            ACCOUNT NO.:  [id number]   MEDICAL RECORD NO.:  [id number]                   PATIENT TYPE:  INP   LOCATION:  0002                                 FACILITY:  [facility]   PHYSICIAN:  Duke Salvia. Marcelle Overlie, M.D.            DATE OF BIRTH:  1950/09/15   DATE OF PROCEDURE:  07/04/2003  DATE OF DISCHARGE:                                 OPERATIVE REPORT   PREOPERATIVE DIAGNOSES:  1. Uterine descensus.  2. Cystocele and rectocele, symptomatic.  3. Stress urinary incontinence.   POSTOPERATIVE DIAGNOSES:  1. Uterine descensus.  2. Cystocele and rectocele, symptomatic.  3. Stress urinary incontinence.   PROCEDURE:  Laparoscopically assisted vaginal hysterectomy, bilateral  salpingo-oophorectomy, anterior and posterior colporrhaphy.   SURGEON:  Duke Salvia. Marcelle Overlie, M.D.   ASSISTANT:  Tracie Harrier, M.D.   ANESTHESIA:  General endotracheal.   COMPLICATIONS:  None.   DRAINS:  Foley catheter.   ESTIMATED BLOOD LOSS:  250   SPECIMENS REMOVED:  Uterus, bilateral tubes and ovaries, portions of vaginal  mucosa.   FINDINGS:  The patient was taken to the operating room after an adequate  level of general endotracheal anesthesia was obtained with the patient's leg  in stirrups. The abdomen, perineum and vagina were prepped and draped in the  usual manner for a laparoscopy. The bladder was drained, EUA carried out,  uterus was in mid position, normal size, adnexa negative.  Attention  directed to the abdomen where 0.5% Marcaine plain was injected in the  subumbilical area. A small incision was made, Veress needle was introduced  without difficulty. Its intraabdominal position was verified by pressure and  water testing.  The laparoscopic trocar __________was then introduced  without difficulty.  There was no evidence of any bleeding or trauma. Three  fingerbreadths above the symphysis in the midline __________ 5 mm trocar was  inserted without  difficulty.  The patient then placed in Trendelenburg.  Prior to this, a Hulka tenaculum had been positioned on the uterus.  Pelvic  findings as follows:  The anterior and posterior space was unremarkable,  bilateral tubes and ovaries were normal, upper abdomen was unremarkable.  The uterus was upper limits of normal size. With the uterus placed on  tension, an atraumatic grasping forceps was used to grasp the right tube and  ovary which was placed on tension toward the midline.  The course of the  ureter was noted to be well below. The bipolar cutting instrument was then  used to coagulate and cut the right IP ligament down to and including the  right round ligament again assuring that the course of the ureter was below.  This was hemostatic. The exact same repeated on the opposite side. Once the  adnexa was dissected on either side up to and including the round ligament.  The vaginal portion of the procedure started at that point. The legs were  extended, weighted speculum was positioned, cervix grasped with a tenaculum.  The cervicovaginal mucosa was incised, posterior culdotomy performed without  difficulty. The LigaSure system was used to coagulate and cut the  uterosacral ligament, the bladder was advanced superiorly until the anterior  peritoneum could be identified. This was entered sharply and a retractor was  used to gently elevate the bladder out of the field.  In sequential manner,  the uterine vasculature pedicle and upper broad ligament pedicles were  clamped, coagulated and divided with the LigaSure system. The fundus of the  uterus was then delivered posteriorly. The remaining pedicles were clamped,  divided, the specimen was removed and those were free tied with #0 Vicryl  tie.  The cuff was then closed from 3 to 9 o'clock with a running locked 2-0  Dexon suture. All major pedicles were inspected and noted to be hemostatic.  Prior to closure, sponge, needle and instrument  counts were reported as  correct x2.  The vaginal mucosa was then closed right to left with  interrupted 2-0 Monocryl sutures with excellent hemostasis.  Attention  directed to the posterior repair. A small triangle of perineal skin was  excised between Allis clamps. The vaginal mucosa was dissected up to about  3/4 of the way up the back wall.  The underlying perirectal tissue was  separated free from the mucosal.  The perirectal fascia was then plicated in  the midline with 2-0 Vicryl interrupted sutures. A small amount of excess  posterior mucosa was incised and reapproximated with 2-0 Vicryl interrupted  sutures.  This was done down to the perineum which was then closed with 2-0  Dexon interrupted sutures and also 4-0 Vicryl repeat sutures on the perineal  skin.  Attention then directed to the anterior repair from the cuff  to just  below the UV angle. The mucosa was separated from the underlying paravesical  tissue with sharp and blunt dissection. The paravesical tissue was then  plicated in the midline with 2-0 Dexon interrupted sutures.  Excess vaginal  mucosa was trimmed and reapproximated with 2-0 Vicryl sutures.  At this  point, Dr. Patsi Sears entered the room to get started on the sling  procedure.  Once this was completed, the pelvis was inspected and note to be  hemostatic. Pressure was let down and hemostasis was observed on all  dissected pedicles. Instruments were removed, gas allowed to escape, defect  was closed with 4-0 Dexon subcuticular sutures and Dermabond. She tolerated  this well and went to the recovery room in good condition.                                               Richard M. Marcelle Overlie, M.D.    RMH/MEDQ  D:  07/04/2003  T:  07/04/2003  Job:  [id number]

## 2010-10-02 NOTE — Procedures (Signed)
[facility]  Patient:    [name], [name] Visit Number: [id number] MRN: [id number]          Service Type: END Location: ENDO Attending Physician:  Mervin Hack Dictated by:   Hedwig Morton. Juanda Chance, M.D. [facility] Proc. Date: 01/20/01 Admit Date:  01/20/2001   CC:         Wilhemina Bonito. Eda Keys., M.D. [facility]   Procedure Report  PROCEDURE:  Colonoscopy.  ENDOSCOPIST:  Hedwig Morton. Juanda Chance, M.D. [facility]  INDICATIONS:  This 60 year old white female with irritable bowel syndrome is undergoing her first colonoscopy.  She has intermittent diarrhea, symptomatic hemorrhoids.  There is no family history of colon cancer.  ENDOSCOPE:  Olympus single-channel colonoscope.  SEDATION:   Versed 7 mg, IV Demerol 100 mg IV.  DESCRIPTION OF PROCEDURE:  Olympus single-channel colonoscope was passed under direct vision to the sigmoid colon.  The patient was monitored by pulse oximetry.  Her oxygen saturation were normal.  Her prep was satisfactory. Anal canal showed one internal hemorrhoid which was somewhat erythematous but did not collapse and showed no stigmata of bleeding.  Sigmoid colon mucosa was normal.  The stool folds were normal.  There were no diverticulum. Colonoscope passed easily from the descending colon, splenic flexure, transverse colon, hepatic flexure, ascending colon, to the cecum.  Cecal pouch and cecal valve were unremarkable.  Complete view of the cecal pouch obtained, and video photographs were taken.  Colonoscope was then retracted.  There were no polyps.  IMPRESSION:   Normal colonoscopy to the cecum.  PLAN:  Treat irritable bowel syndrome with high-fiber diet, possible fiber supplements, and antispasmodics.  This will be discussed with the patient. Dictated by:   Hedwig Morton. Juanda Chance, M.D. [facility] Attending Physician:  Mervin Hack DD:  01/20/01 TD:  01/20/01 Job: [id number]

## 2010-10-02 NOTE — H&P (Signed)
[name], [name]                            ACCOUNT NO.:  [id number]   MEDICAL RECORD NO.:  [id number]                   PATIENT TYPE:  INP   LOCATION:  NA                                   [facility]ACILITY[facility]  [facility]   PHYSICIAN:  Duke Salvia. Marcelle Overlie, M.D.            DATE OF BIRTH:  08-05-1950   DATE OF ADMISSION:  DATE OF DISCHARGE:                                HISTORY & PHYSICAL   CHIEF COMPLAINT:  1. Uterine descensus, symptomatic cystocele and rectocele.  2. SUI.   HISTORY OF PRESENT ILLNESS:  A 60 year old, G3, P2, postmenopausal patient  who has been on Activella without any complications or complaints. In the  last year she has noted increasing problems with SUI, constipation, and  symptomatic cystocele and rectocele. She presents now for LAVH, BSO, and  sling by Dr. Patsi Sears. This procedure including risks of bleeding,  infection, transfusion, adjacent organ injury, possible need for open or  additional surgery were reviewed along with discussion regarding ERT,  transfusion risks, phlebitis risks, and her expected recovery time.   PAST MEDICAL HISTORY:  Allergies: CODEINE. Operations: Cesarean section in  1986, one vaginal delivery in 1981.   CURRENT MEDICATIONS:  1. Allegra D.  2. Astelin.  3. Singulair.  4. Aciphex.  5. Effexor.  6. Activella.  7. Ambien p.r.n.  8. Lipitor.  9. Centrum Silver.  10.      Aleve p.r.n.   She has just finished a course of Biaxin for sinusitis.   REVIEW OF SYSTEMS:  Significant for a history of prior treated chronic  fatigue syndrome, prior breast biopsy for benign lipoma, history of migraine  treated at [facility].   FAMILY HISTORY:  Significant for mother with history of migraine headaches,  history of varicose veins and phlebitis in her mother. Maternal grandmother  with diabetes.   PHYSICAL EXAMINATION:  VITAL SIGNS: Temperature 97.6, blood pressure 120/72.  HEENT:  Unremarkable.  NECK: Supple without mass.  LUNGS:  Clear.  CARDIOVASCULAR: Regular rate and rhythm without murmurs, rubs, or gallops.  BREASTS:  Without masses.  ABDOMEN: Soft, flat, and nontender.  PELVIC:  Normal external genitalia. Vagina and cervix clear. On straining  there is uterine descensus, moderate cystocele and rectocele noted. The cup  support was otherwise reasonable. Bimanual otherwise negative. Rectovaginal  confirms.  EXTREMITIES/NEUROLOGIC EXAM: Unremarkable.   IMPRESSION:  1. Stress urinary incontinence.  2. Uterine prolapse with symptomatic cystocele and rectocele.   PLAN:  LAVH, BSO. The procedure and risks reviewed as above. Dr. Patsi Sears  to perform sling at the same time.                                               Richard M. Marcelle Overlie, M.D.    RMH/MEDQ  D:  07/01/2003  T:  07/01/2003  Job:  [id number]

## 2010-10-02 NOTE — Assessment & Plan Note (Signed)
[facility]                             PULMONARY OFFICE NOTE   [name], [name]                         MRN:          [id number]  DATE:06/16/2006                            DOB:          14-Mar-1951    PULMONARY/ALLERGY FOLLOWUP   PRIMARY CARE PHYSICIAN:  Rosalyn Gess. Norins, MD   PROBLEM:  1. Allergic rhinitis.  2. Allergic conjunctivitis.   HISTORY:  I had seen this lady in May of 2006 for a long history of  seasonal and perennial allergic rhinitis. She had had skin testing and  had been on allergy vaccine with Dr. Corinda Gubler 20 years or so ago and then  had worked with Dr. Sharyn Lull. Both times, allergy vaccine helped. She did  not follow up at that time, but had gotten samples of antihistamines  including Xyzal. She has tried a number of different steroid nasal  sprays without success. She comes now with primary complaints of  annoying watery rhinorrhea. She has not had much recent nasal congestion  and no headache or purulent discharge. She gets some mild chest  tightness with exertion especially in cold air. Her ears click and never  feel clear, but she has not had problems on airplane flights. She  describes a trip when she had flown to [place] and had done some scuba  diving related to which she developed blood behind one of her drums, but  that resolved completely. Her only surgeries have been C-section and  hysterectomy with no nose or throat surgery. She now requests allergy  testing. She had taken Allegra yesterday.   MEDICATIONS:  1. Multivitamin.  2. Effexor 150 mg.  3. Estradiol.  4. Ambien 10 mg p.r.n.  5. AcipHex 20 mg.  6. Tylenol or Aleve.  7. Occasional Allegra D 24 hour.  8. Benicar hydrochlorothiazide 20/12.5.  9. Lipitor 20 mg.  10.Xanax 0.25 mg.  11.Zomig.   DRUG INTOLERANCES:  CODEINE.   OBJECTIVE:  Weight 132 pounds.  Blood pressure 104/62.  Pulse regular at  86.  Room air saturation 100%.  Tympanic membranes are  clear.  Nose is quite watery, but not obstructed. Mucosa may be a little pale,  but not dramatically so. No visible polyps and no apparent post-nasal  drainage or pharyngeal irritation.  Conjunctivae are clear.  LUNGS:  Clear with good airflow.  Heart sounds regular without murmur.   IMPRESSION:  1. Allergic rhinitis.  2. Mild exercise-induced asthma.   PLAN:  1. Suggested use of albuterol to pre-treat herself before exercise.  2. She will return off of antihistamines x3 days for allergy testing.  3. Try ipratropium 0.06% nasal spray once or twice each nostril up to      t.i.d. p.r.n.     Clinton D. Maple Hudson, MD, Tonny Bollman, FACP  Electronically Signed    CDY/MedQ  DD: 06/16/2006  DT: 06/16/2006  Job #: [id number]

## 2010-10-02 NOTE — Discharge Summary (Signed)
[name], MOM                            ACCOUNT NO.:  [id number]   MEDICAL RECORD NO.:  [id number]                   PATIENT TYPE:  INP   LOCATION:  0354                                 FACILITY:  [facility]   PHYSICIAN:  Duke Salvia. Marcelle Overlie, M.D.            DATE OF BIRTH:  [birth year]   DATE OF ADMISSION:  07/04/2003  DATE OF DISCHARGE:  07/06/2003                                 DISCHARGE SUMMARY   DISCHARGE DIAGNOSES:  1. Uterine descensus with symptomatic cystocele and rectocele.  2. LAVH/BSO, A&P repair this admission.  3. Stress urinary incontinence, pubovaginal sling per Dr. Patsi Sears this     admission.   SUMMARY/HISTORY/PHYSICAL EXAM:  Please see admission H&P for details.  Briefly, a 60 year old, G3, P2, postmenopausal patient with symptomatic  uterine descensus, cystocele, and rectocele along with SUI, presents for  surgical repair.   HOSPITAL COURSE:  On July 04, 2003, under general anesthesia, the  patient underwent LAVH/BSO, anterior and posterior colporrhaphy, and  pubovaginal sling per Dr. Patsi Sears.  The EBL was 250 mL.  First postop  day, the catheter was removed.  The IV was discontinued.  Her diet was  slowly advanced.  The incisions were clean and dry at the time, and she was  afebrile.  The first postop day, she was voiding well.  She was discharged  on the a.m. of the second postop day.  The abdominal incisions were clean  and dry at the time.  She was tolerating a regular diet, ambulating without  difficulty, and remained afebrile.  Preop hemoglobin was 12.7.  Postop  hemoglobin on February 18, was 9.6.   LAB DATA:  CMET on admission normal except for glucose slightly elevated at  110.  Platelet count was 514,000.  Blood type is O positive.  Antibody  screen was negative.  UPT was negative.  UA was unremarkable except for  small leukocyte esterase, many epithelial cells.  Chest x-ray showed NAD.  EKG was normal sinus rhythm.   DISPOSITION:  1. The  patient was discharged on Tylox p.r.n. pain.  2. __________ 0.06 patch was applied at the time of her discharge.  She may     resume her regular medicines.  3. I will see her back in 1 week.  4. Advised her to report any incisional redness or drainage, increased pain     or bleeding, fever over 101.  5. I also advised her to report urinary complaints such has hematuria,     increased frequency, dysuria, vaginal bleeding.  6. She was given specific instructions regarding diet __________ exercise.  7. Advised Colace once daily.  8. Sitz baths p.r.n.  9. Discussed laxative use also.   CONDITION:  Good.   ACTIVITY:  Graded increase.  Richard M. Marcelle Overlie, M.D.    RMH/MEDQ  D:  07/06/2003  T:  07/06/2003  Job:  [id number]   cc:   Lynelle Smoke I. Patsi Sears, M.D.  [address]  [place]  [place] [address]  Fax: [phone]

## 2010-10-28 ENCOUNTER — Encounter: Payer: Self-pay | Admitting: Internal Medicine

## 2010-10-29 ENCOUNTER — Other Ambulatory Visit: Payer: Self-pay | Admitting: Internal Medicine

## 2010-10-29 ENCOUNTER — Other Ambulatory Visit (INDEPENDENT_AMBULATORY_CARE_PROVIDER_SITE_OTHER): Payer: PRIVATE HEALTH INSURANCE

## 2010-10-29 ENCOUNTER — Ambulatory Visit (INDEPENDENT_AMBULATORY_CARE_PROVIDER_SITE_OTHER): Payer: PRIVATE HEALTH INSURANCE | Admitting: Internal Medicine

## 2010-10-29 DIAGNOSIS — E785 Hyperlipidemia, unspecified: Secondary | ICD-10-CM

## 2010-10-29 DIAGNOSIS — R030 Elevated blood-pressure reading, without diagnosis of hypertension: Secondary | ICD-10-CM

## 2010-10-29 DIAGNOSIS — Z0000 Encounter for general adult medical examination without abnormal findings: Secondary | ICD-10-CM

## 2010-10-29 DIAGNOSIS — IMO0001 Reserved for inherently not codable concepts without codable children: Secondary | ICD-10-CM

## 2010-10-29 DIAGNOSIS — F314 Bipolar disorder, current episode depressed, severe, without psychotic features: Secondary | ICD-10-CM

## 2010-10-29 DIAGNOSIS — K219 Gastro-esophageal reflux disease without esophagitis: Secondary | ICD-10-CM

## 2010-10-29 LAB — HEPATIC FUNCTION PANEL
ALT: 20 U/L (ref 0–35)
AST: 26 U/L (ref 0–37)
Albumin: 4 g/dL (ref 3.5–5.2)
Alkaline Phosphatase: 111 U/L (ref 39–117)
Bilirubin, Direct: 0.1 mg/dL (ref 0.0–0.3)
Total Bilirubin: 0.5 mg/dL (ref 0.3–1.2)
Total Protein: 7.3 g/dL (ref 6.0–8.3)

## 2010-10-29 LAB — COMPREHENSIVE METABOLIC PANEL
ALT: 20 U/L (ref 0–35)
AST: 26 U/L (ref 0–37)
Albumin: 4 g/dL (ref 3.5–5.2)
Alkaline Phosphatase: 111 U/L (ref 39–117)
BUN: 17 mg/dL (ref 6–23)
CO2: 30 mEq/L (ref 19–32)
Calcium: 10.3 mg/dL (ref 8.4–10.5)
Chloride: 104 mEq/L (ref 96–112)
Creatinine, Ser: 0.9 mg/dL (ref 0.4–1.2)
GFR: 67.7 mL/min (ref >60.00)
Glucose, Bld: 101 mg/dL — ABNORMAL HIGH (ref 70–99)
Potassium: 4.3 mEq/L (ref 3.5–5.1)
Sodium: 140 mEq/L (ref 135–145)
Total Bilirubin: 0.5 mg/dL (ref 0.3–1.2)
Total Protein: 7.3 g/dL (ref 6.0–8.3)

## 2010-10-29 LAB — CBC WITH DIFFERENTIAL/PLATELET
Basophils Absolute: 0.1 10*3/uL (ref 0.0–0.1)
Basophils Relative: 1.7 % (ref 0.0–3.0)
Eosinophils Absolute: 0.2 10*3/uL (ref 0.0–0.7)
Eosinophils Relative: 4 % (ref 0.0–5.0)
HCT: 37.3 % (ref 36.0–46.0)
Hemoglobin: 12.9 g/dL (ref 12.0–15.0)
Lymphocytes Relative: 31 % (ref 12.0–46.0)
Lymphs Abs: 1.8 10*3/uL (ref 0.7–4.0)
MCHC: 34.7 g/dL (ref 30.0–36.0)
MCV: 90.3 fl (ref 78.0–100.0)
Monocytes Absolute: 0.3 10*3/uL (ref 0.1–1.0)
Monocytes Relative: 5.8 % (ref 3.0–12.0)
Neutro Abs: 3.4 10*3/uL (ref 1.4–7.7)
Neutrophils Relative %: 57.5 % (ref 43.0–77.0)
Platelets: 409 10*3/uL — ABNORMAL HIGH (ref 150.0–400.0)
RBC: 4.14 Mil/uL (ref 3.87–5.11)
RDW: 13.2 % (ref 11.5–14.6)
WBC: 5.9 10*3/uL (ref 4.5–10.5)

## 2010-10-29 LAB — LIPID PANEL
Cholesterol: 312 mg/dL — ABNORMAL HIGH (ref 0–200)
HDL: 54.5 mg/dL (ref >39.00)
Total CHOL/HDL Ratio: 6
Triglycerides: 271 mg/dL — ABNORMAL HIGH (ref 0.0–149.0)
VLDL: 54.2 mg/dL — ABNORMAL HIGH (ref 0.0–40.0)

## 2010-10-29 LAB — TSH: TSH: 3.63 u[IU]/mL (ref 0.35–5.50)

## 2010-10-29 LAB — LDL CHOLESTEROL, DIRECT: Direct LDL: 222.3 mg/dL

## 2010-10-29 MED ORDER — LAMOTRIGINE 150 MG PO TABS: ORAL_TABLET | ORAL | Status: DC

## 2010-10-29 MED ORDER — ALPRAZOLAM 0.5 MG PO TABS: 0.5000 mg | ORAL_TABLET | Freq: Three times a day (TID) | ORAL | Status: DC | PRN

## 2010-10-29 NOTE — Progress Notes (Signed)
Subjective:    Patient ID: [name], female    DOB: 1950/10/03, 60 y.o.   MRN: [id number]  HPI [name] has not been seen for a while. She has been doing OK. She is current with Gyn and with her Neurosurgeon. She has been able to wean herself off most of her psychotropic drugs (see new med list). She is feeling good. She just married and is very happy.   She is having real problems with her 70 y/o son who is not settled. She has noted that on 3 occasions she has had episodes of very high blood pressure that were associated with acute emotional distress. She would have headache, neck pain and feel bad and her BP would be elevated. The rest of the time her BP has been well controlled.   Past Medical History  Diagnosis Date  . Deep phlebothrombosis, postpartum, unspecified as to episode of care   . Bipolar I disorder, most recent episode (or current) depressed, severe, without mention of psychotic behavior   . Other and unspecified hyperlipidemia   . IBS (irritable bowel syndrome)   . GERD (gastroesophageal reflux disease)   . Allergic rhinitis, cause unspecified    Past Surgical History  Procedure Date  . Caesarean section    Family History  Problem Relation Age of Onset  . Coronary artery disease Father   . Heart disease Father   . Hypertension Father   . Diabetes Maternal Grandmother   . Arthritis Mother   . Hypertension Mother    History   Social History  . Marital Status: Divorced    Spouse Name: N/A    Number of Children: 2  . 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 Smokeless tobacco: Never Used  . Alcohol Use: 1.0 - 1.5 oz/week    2-3 drink(s) per week  . Drug Use: No  . Sexually Active: Yes -- Female partner(s)   Other Topics Concern  . Not on file   Social History Narrative   Married 4 years- divorced; married 10 years- divorced. Serially  monogamous relationships. Remarried March '11. 2 sons- '82, '87. 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 (June "12). Lives in her own home       Review of Systems Review of Systems  Constitutional:  Negative for fever, chills, activity change and unexpected weight change.  HEENT:  Negative for hearing loss, ear pain, congestion, neck stiffness and postnasal drip. Negative for sore throat or swallowing problems. Negative for dental complaints.   Eyes: Negative for vision loss or change in visual acuity.  Respiratory: Negative for chest tightness and wheezing.   Cardiovascular: Negative for chest pain and palpitationNo decreased exercise tolerance Gastrointestinal: No change in bowel habit. No bloating or gas. No reflux or indigestion Genitourinary: Negative for urgency, frequency, flank pain and difficulty urinating.  Musculoskeletal: Negative for myalgias, back pain, arthralgias and gait problem.  Neurological: Negative for dizziness, tremors, weakness and headaches.  Hematological: Negative for adenopathy.  Psychiatric/Behavioral: Negative for behavioral problems and dysphoric mood.       Objective:   Physical Exam Vitals noted - Blood pressure normal Gen'l - WNWD white woman who looks fit HEENT - C&S clear, Fundi - no hemorrhages, normal disc margins Neck - supple Chest - Clear to A&P Cor - RRR,  no murmurs Neuro - A&O x 3, CN II-XII grossly intact       Assessment & Plan:

## 2010-10-30 ENCOUNTER — Encounter: Payer: Self-pay | Admitting: Internal Medicine

## 2010-10-30 DIAGNOSIS — Z0000 Encounter for general adult medical examination without abnormal findings: Secondary | ICD-10-CM | POA: Insufficient documentation

## 2010-10-30 HISTORY — DX: Encounter for general adult medical examination without abnormal findings: Z00.00

## 2010-10-30 NOTE — Assessment & Plan Note (Signed)
She reports that she is doing very well. She is very happy to be married. She continues on celexa, lamictal and asneed alprazolam.

## 2010-10-30 NOTE — Assessment & Plan Note (Signed)
By report she had several episodes of transiently elevated blood pressure that were associated with emotional distress. She does check her BP at other times and it has been in the 130s/80s as it is today.  Plan - no indication for regular medical therapy at this time           Continue to monitor BP periodically at home.           For anxiety related excursion of BP she may treat with alprazolam.

## 2010-10-30 NOTE — Assessment & Plan Note (Signed)
She reports she is current with her gyn for pelvic/PAP. She is current with mammography. No colonoscopy report in EMR - she may be due for exam. Routine lab results are OK except for cholesterol panel. Immunizations: she is current with tetanus; she is due for pneumonia and shingles vaccine.

## 2010-10-30 NOTE — Assessment & Plan Note (Signed)
Out of control!! HDL of at 54, LDL 223 - very high.  Plan - needs to resume medical therapy - lipitor 40mg  or crestor 20. Will call patient as to preference.

## 2010-11-04 ENCOUNTER — Telehealth: Payer: Self-pay | Admitting: *Deleted

## 2010-11-04 NOTE — Telephone Encounter (Signed)
Pt requesting Rx to be sent to [facility] on [address] for Migraine.

## 2010-11-05 ENCOUNTER — Telehealth: Payer: Self-pay | Admitting: *Deleted

## 2010-11-05 MED ORDER — ZOLMITRIPTAN 5 MG PO TABS: 5.0000 mg | ORAL_TABLET | ORAL | Status: DC | PRN

## 2010-11-05 NOTE — Telephone Encounter (Signed)
Happy to. What medication does she use for migraine?

## 2010-11-05 NOTE — Telephone Encounter (Signed)
Rx Done per VO Dr. Debby Bud

## 2010-11-05 NOTE — Telephone Encounter (Signed)
Zomig is not covered. Patient requesting RX for generic imitrex. OK?

## 2010-11-05 NOTE — Telephone Encounter (Signed)
Pt states that she uses either Zomeg or Vicodin.

## 2010-11-05 NOTE — Telephone Encounter (Signed)
Ok for generic imitrex 100mg  # 6 per month, 18 for 3 months

## 2010-11-06 MED ORDER — SUMATRIPTAN SUCCINATE 100 MG PO TABS: 100.0000 mg | ORAL_TABLET | Freq: Once | ORAL | Status: DC | PRN

## 2010-11-06 NOTE — Telephone Encounter (Signed)
Left mess for pt that rx was sent to pharm.

## 2010-11-20 ENCOUNTER — Telehealth: Payer: Self-pay | Admitting: *Deleted

## 2010-11-20 MED ORDER — CYCLOBENZAPRINE HCL 10 MG PO TABS: 10.0000 mg | ORAL_TABLET | Freq: Two times a day (BID) | ORAL | Status: DC | PRN

## 2010-11-20 MED ORDER — CITALOPRAM HYDROBROMIDE 20 MG PO TABS: 20.0000 mg | ORAL_TABLET | Freq: Every day | ORAL | Status: DC

## 2010-11-20 MED ORDER — ESTRADIOL 1 MG PO TABS: 1.0000 mg | ORAL_TABLET | Freq: Every day | ORAL | Status: DC

## 2010-11-20 MED ORDER — LAMOTRIGINE 150 MG PO TABS: 150.0000 mg | ORAL_TABLET | ORAL | Status: DC

## 2010-11-20 NOTE — Telephone Encounter (Signed)
Patient requesting RF - 90 day supply to go to [facility]. Unsure what meds, needs to call to clarify.

## 2010-11-20 NOTE — Telephone Encounter (Signed)
Left mess to call office back.   

## 2010-11-20 NOTE — Telephone Encounter (Signed)
Patient requesting RFs of all meds - 90 day supply. OK for xanax?

## 2010-11-20 NOTE — Telephone Encounter (Deleted)
Reviewed system and several rx sent escript. Closing phone note

## 2010-11-22 NOTE — Telephone Encounter (Signed)
Ok for refills, 11 refills,  including xanax - 5 refills

## 2010-11-24 MED ORDER — ALPRAZOLAM 0.5 MG PO TABS: 0.5000 mg | ORAL_TABLET | Freq: Three times a day (TID) | ORAL | Status: DC | PRN

## 2010-11-24 NOTE — Telephone Encounter (Signed)
Called in 3 mth supply and canceled existing rfs

## 2011-02-05 LAB — CBC
HCT: 30.8 — ABNORMAL LOW
Hemoglobin: 10.6 — ABNORMAL LOW
MCHC: 34.5
MCV: 89.8
Platelets: 379
RBC: 3.43 — ABNORMAL LOW
RDW: 13.5
WBC: 7.1

## 2011-02-05 LAB — HEPARIN LEVEL (UNFRACTIONATED)
Heparin Unfractionated: 0.97 — ABNORMAL HIGH
Heparin Unfractionated: 1.24 — ABNORMAL HIGH

## 2011-02-05 LAB — CARDIAC PANEL(CRET KIN+CKTOT+MB+TROPI)
CK, MB: 0.9
CK, MB: 1
CK, MB: 1.3
Relative Index: INVALID
Relative Index: INVALID
Relative Index: INVALID
Total CK: 46
Total CK: 47
Total CK: 56
Troponin I: 0.01
Troponin I: 0.01
Troponin I: 0.02

## 2011-02-05 LAB — POCT CARDIAC MARKERS
CKMB, poc: <1 — ABNORMAL LOW
CKMB, poc: <1 — ABNORMAL LOW
Myoglobin, poc: 49.2
Myoglobin, poc: 50.6
Operator id: 114141
Operator id: 285841
Troponin i, poc: <0.05
Troponin i, poc: <0.05

## 2011-02-05 LAB — POCT I-STAT CREATININE
Creatinine, Ser: 1.1
Operator id: 114141

## 2011-02-05 LAB — I-STAT 8, (EC8 V) (CONVERTED LAB)
Acid-Base Excess: 3 — ABNORMAL HIGH
BUN: 19
Bicarbonate: 27 — ABNORMAL HIGH
Chloride: 105
Glucose, Bld: 86
HCT: 34 — ABNORMAL LOW
Hemoglobin: 11.6 — ABNORMAL LOW
Operator id: 114141
Potassium: 4.3
Sodium: 135
TCO2: 28
pCO2, Ven: 36.3 — ABNORMAL LOW
pH, Ven: 7.479 — ABNORMAL HIGH

## 2011-02-05 LAB — D-DIMER, QUANTITATIVE: D-Dimer, Quant: 0.28

## 2011-06-18 ENCOUNTER — Telehealth: Payer: Self-pay | Admitting: *Deleted

## 2011-06-18 NOTE — Telephone Encounter (Signed)
[facility] pharmacy called to see if rx for Estradiol can be changed to 2 mg 1/2 tablet day because Estradiol 1mg  tablets are on back order-please advise.

## 2011-06-21 NOTE — Telephone Encounter (Signed)
ok 

## 2011-06-22 MED ORDER — ESTRADIOL 2 MG PO TABS: 1.0000 mg | ORAL_TABLET | Freq: Every day | ORAL | Status: DC

## 2011-06-22 NOTE — Telephone Encounter (Signed)
Done

## 2011-06-23 ENCOUNTER — Other Ambulatory Visit: Payer: Self-pay | Admitting: *Deleted

## 2011-06-23 DIAGNOSIS — Z79899 Other long term (current) drug therapy: Secondary | ICD-10-CM

## 2011-06-23 DIAGNOSIS — E785 Hyperlipidemia, unspecified: Secondary | ICD-10-CM

## 2011-06-23 NOTE — Telephone Encounter (Signed)
Received fax pt requesting refills on alprazolam 0.5mg  take 1 three times a day as needed. Last filled 04/28/11 # 270. Is this ok to refill?

## 2011-06-24 MED ORDER — ALPRAZOLAM 0.5 MG PO TABS: 0.5000 mg | ORAL_TABLET | Freq: Three times a day (TID) | ORAL | Status: AC | PRN

## 2011-06-24 NOTE — Telephone Encounter (Signed)
Called pharmacy wanting to clarify quanity. Last script was written for # 270. If md still want # 270 will not be able to give 5 additional refills. Pls advise... 06/24/11@10 :18am/LMB

## 2011-06-24 NOTE — Telephone Encounter (Signed)
OK for refill x 5 Is she taking cholesterol medication? Should be on crestor. If she is she due for lab -lipid profile and hepatic.

## 2011-06-24 NOTE — Telephone Encounter (Signed)
See previous note

## 2011-06-24 NOTE — Telephone Encounter (Signed)
If Ms [name] wants a 3 month supply, 270, then no refills

## 2011-06-25 NOTE — Telephone Encounter (Signed)
Notified pharmacy spoke with Matt/ ok # 270 only. Epic already updated by md....,also called pt to verify if she is taking cholesterol med. No answer LMOM md response also entered labs in EPIC... 06/25/11@9 :54am/LMB

## 2011-06-25 NOTE — Telephone Encounter (Signed)
Addended by: Deatra James on: 06/25/2011 09:56 AM   Modules accepted: Orders

## 2011-10-12 ENCOUNTER — Other Ambulatory Visit: Payer: Self-pay | Admitting: Internal Medicine

## 2011-10-21 ENCOUNTER — Telehealth: Payer: Self-pay | Admitting: Internal Medicine

## 2011-10-21 NOTE — Telephone Encounter (Signed)
Having problems with GERD and IBS for several months. States she needs an OV.Scheduled patient on 10/26/11 at 2:00 PM.

## 2011-10-21 NOTE — Telephone Encounter (Signed)
Left a message for patient to call me. 

## 2011-10-25 ENCOUNTER — Encounter: Payer: Self-pay | Admitting: Internal Medicine

## 2011-10-25 ENCOUNTER — Encounter: Payer: Self-pay | Admitting: *Deleted

## 2011-10-26 ENCOUNTER — Telehealth: Payer: Self-pay | Admitting: Internal Medicine

## 2011-10-26 ENCOUNTER — Ambulatory Visit: Payer: PRIVATE HEALTH INSURANCE | Admitting: Internal Medicine

## 2011-10-26 NOTE — Telephone Encounter (Signed)
Message copied by Arna Snipe on Tue Oct 26, 2011  2:33 PM ------      Message from: Richardson Chiquito      Created: Tue Oct 26, 2011 12:36 PM      Regarding: FW: no  charge                   ----- Message -----         From: Hart Carwin, MD         Sent: 10/26/2011  12:08 PM           To: Richardson Chiquito, CMA      Subject: no  charge                                               Don't charge      ----- Message -----         From: Richardson Chiquito, CMA         Sent: 10/26/2011   9:13 AM           To: Hart Carwin, MD            Dr Juanda Chance, do you want to charge late cancellation fee?      ----- Message -----         From: Holli Humbles         Sent: 10/26/2011   9:02 AM           To: Richardson Chiquito, CMA            Pt canceled her appt for today because she is sick with a migraine

## 2011-11-05 ENCOUNTER — Telehealth: Payer: Self-pay | Admitting: *Deleted

## 2011-11-05 NOTE — Telephone Encounter (Signed)
Rf req for Zostavax 0.65 ml. Please advise ok to Rf?

## 2011-11-07 NOTE — Telephone Encounter (Signed)
Ok for Rx for zostavax vaccination. Please record under immunizations as shingles vaccine authorized.

## 2011-11-08 MED ORDER — ZOSTER VACCINE LIVE 19400 UNT/0.65ML ~~LOC~~ SOLR: 0.6500 mL | Freq: Once | SUBCUTANEOUS | Status: AC

## 2011-11-08 NOTE — Telephone Encounter (Signed)
Done

## 2012-01-05 ENCOUNTER — Other Ambulatory Visit: Payer: Self-pay | Admitting: Internal Medicine

## 2012-01-05 ENCOUNTER — Encounter: Payer: Self-pay | Admitting: *Deleted

## 2012-01-14 ENCOUNTER — Telehealth: Payer: Self-pay | Admitting: Internal Medicine

## 2012-01-14 NOTE — Telephone Encounter (Signed)
Caller: [name]/Patient; Phone: [phone]; Reason for Call: 02/07/12 1:30 appt was cancelled; pt requests she receive call back to confirm this cancellation.

## 2012-01-18 ENCOUNTER — Other Ambulatory Visit (HOSPITAL_COMMUNITY): Payer: Self-pay | Admitting: Family Medicine

## 2012-01-18 DIAGNOSIS — R911 Solitary pulmonary nodule: Secondary | ICD-10-CM

## 2012-01-18 DIAGNOSIS — G43909 Migraine, unspecified, not intractable, without status migrainosus: Secondary | ICD-10-CM

## 2012-01-20 ENCOUNTER — Ambulatory Visit (HOSPITAL_COMMUNITY)
Admission: RE | Admit: 2012-01-20 | Discharge: 2012-01-20 | Disposition: A | Discharge status: Home / Self Care | Payer: PRIVATE HEALTH INSURANCE | Source: Ambulatory Visit | Attending: Family Medicine | Admitting: Family Medicine

## 2012-01-20 ENCOUNTER — Other Ambulatory Visit (HOSPITAL_COMMUNITY): Payer: Self-pay | Admitting: Family Medicine

## 2012-01-20 DIAGNOSIS — R911 Solitary pulmonary nodule: Secondary | ICD-10-CM | POA: Insufficient documentation

## 2012-01-20 DIAGNOSIS — R05 Cough: Secondary | ICD-10-CM | POA: Insufficient documentation

## 2012-01-20 DIAGNOSIS — J329 Chronic sinusitis, unspecified: Secondary | ICD-10-CM | POA: Insufficient documentation

## 2012-01-20 DIAGNOSIS — K7689 Other specified diseases of liver: Secondary | ICD-10-CM | POA: Insufficient documentation

## 2012-01-20 DIAGNOSIS — G43909 Migraine, unspecified, not intractable, without status migrainosus: Secondary | ICD-10-CM

## 2012-01-20 DIAGNOSIS — R059 Cough, unspecified: Secondary | ICD-10-CM | POA: Insufficient documentation

## 2012-01-20 MED ORDER — IOHEXOL 300 MG/ML  SOLN
85.0000 mL | Freq: Once | INTRAMUSCULAR | Status: AC | PRN
  Administered 2012-01-20: 85 mL via INTRAVENOUS

## 2012-01-21 ENCOUNTER — Ambulatory Visit (HOSPITAL_COMMUNITY): Payer: PRIVATE HEALTH INSURANCE

## 2012-01-26 ENCOUNTER — Ambulatory Visit: Payer: PRIVATE HEALTH INSURANCE | Admitting: Internal Medicine

## 2012-02-07 ENCOUNTER — Encounter: Payer: PRIVATE HEALTH INSURANCE | Admitting: Internal Medicine

## 2012-02-23 ENCOUNTER — Other Ambulatory Visit (HOSPITAL_COMMUNITY): Payer: Self-pay | Admitting: Obstetrics and Gynecology

## 2012-02-23 DIAGNOSIS — N9489 Other specified conditions associated with female genital organs and menstrual cycle: Secondary | ICD-10-CM

## 2012-02-25 ENCOUNTER — Ambulatory Visit (HOSPITAL_COMMUNITY)
Admission: RE | Admit: 2012-02-25 | Discharge: 2012-02-25 | Disposition: A | Discharge status: Home / Self Care | Payer: Medicare (Managed Care) | Source: Ambulatory Visit | Attending: Obstetrics and Gynecology | Admitting: Obstetrics and Gynecology

## 2012-02-25 DIAGNOSIS — N9489 Other specified conditions associated with female genital organs and menstrual cycle: Secondary | ICD-10-CM

## 2012-02-25 DIAGNOSIS — R1032 Left lower quadrant pain: Secondary | ICD-10-CM | POA: Insufficient documentation

## 2012-02-25 DIAGNOSIS — Z90710 Acquired absence of both cervix and uterus: Secondary | ICD-10-CM | POA: Insufficient documentation

## 2012-02-25 MED ORDER — IOHEXOL 300 MG/ML  SOLN
100.0000 mL | Freq: Once | INTRAMUSCULAR | Status: AC | PRN
  Administered 2012-02-25: 100 mL via INTRAVENOUS

## 2012-03-06 ENCOUNTER — Other Ambulatory Visit: Payer: Self-pay | Admitting: Obstetrics and Gynecology

## 2012-03-06 DIAGNOSIS — D219 Benign neoplasm of connective and other soft tissue, unspecified: Secondary | ICD-10-CM

## 2012-03-14 ENCOUNTER — Ambulatory Visit
Admission: RE | Admit: 2012-03-14 | Discharge: 2012-03-14 | Disposition: A | Discharge status: Home / Self Care | Payer: PRIVATE HEALTH INSURANCE | Source: Ambulatory Visit | Attending: Obstetrics and Gynecology | Admitting: Obstetrics and Gynecology

## 2012-03-14 DIAGNOSIS — D219 Benign neoplasm of connective and other soft tissue, unspecified: Secondary | ICD-10-CM

## 2012-03-14 MED ORDER — GADOBENATE DIMEGLUMINE 529 MG/ML IV SOLN
12.0000 mL | Freq: Once | INTRAVENOUS | Status: AC | PRN
  Administered 2012-03-14: 12 mL via INTRAVENOUS

## 2012-04-12 DIAGNOSIS — N393 Stress incontinence (female) (male): Secondary | ICD-10-CM | POA: Insufficient documentation

## 2012-04-12 HISTORY — DX: Stress incontinence (female) (male): N39.3

## 2012-04-16 HISTORY — PX: OTHER SURGICAL HISTORY: SHX169

## 2012-05-17 DIAGNOSIS — Z9221 Personal history of antineoplastic chemotherapy: Secondary | ICD-10-CM

## 2012-05-17 HISTORY — DX: Personal history of antineoplastic chemotherapy: Z92.21

## 2012-08-29 ENCOUNTER — Other Ambulatory Visit (HOSPITAL_COMMUNITY): Payer: Self-pay | Admitting: Internal Medicine

## 2012-08-29 DIAGNOSIS — R911 Solitary pulmonary nodule: Secondary | ICD-10-CM

## 2012-09-22 ENCOUNTER — Ambulatory Visit (HOSPITAL_COMMUNITY): Admission: RE | Admit: 2012-09-22 | Payer: BC Managed Care – PPO | Source: Ambulatory Visit

## 2012-10-06 ENCOUNTER — Encounter: Payer: Self-pay | Admitting: Internal Medicine

## 2012-12-05 ENCOUNTER — Encounter: Payer: Self-pay | Admitting: Internal Medicine

## 2012-12-05 ENCOUNTER — Ambulatory Visit (INDEPENDENT_AMBULATORY_CARE_PROVIDER_SITE_OTHER): Payer: BC Managed Care – PPO | Admitting: Internal Medicine

## 2012-12-05 VITALS — BP 100/72 | HR 76 | Ht 62.6 in | Wt 121.1 lb

## 2012-12-05 DIAGNOSIS — R1319 Other dysphagia: Secondary | ICD-10-CM

## 2012-12-05 DIAGNOSIS — C499 Malignant neoplasm of connective and soft tissue, unspecified: Secondary | ICD-10-CM

## 2012-12-05 DIAGNOSIS — Z1211 Encounter for screening for malignant neoplasm of colon: Secondary | ICD-10-CM

## 2012-12-05 MED ORDER — OMEPRAZOLE 40 MG PO CPDR: 40.0000 mg | DELAYED_RELEASE_CAPSULE | Freq: Every day | ORAL | Status: DC

## 2012-12-05 MED ORDER — MOVIPREP 100 G PO SOLR: 1.0000 | Freq: Once | ORAL | Status: DC

## 2012-12-05 NOTE — Patient Instructions (Addendum)
You have been scheduled for an endoscopy and colonoscopy with propofol. Please follow the written instructions given to you at your visit today. Please pick up your prep at the pharmacy within the next 1-3 days. If you use inhalers (even only as needed), please bring them with you on the day of your procedure. Your physician has requested that you go to www.startemmi.com and enter the access code given to you at your visit today. This web site gives a general overview about your procedure. However, you should still follow specific instructions given to you by our office regarding your preparation for the procedure.  We have sent the following medications to your pharmacy for you to pick up at your convenience: Prilosec 40 mg daily  CC: Dr Illene Regulus, Dr Pippitt,[facility]

## 2012-12-05 NOTE — Progress Notes (Signed)
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 She got married and changed her last name from [place] to [place]. She is here today for 2 GI issues; one is to discuss a screening colonoscopy and 1 is for gastroesophageal reflux with dysphagia. 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 She is still feeling weak. She has a lot of reflux and nocturnal cough. She is mildly constipated. She was told that her treatment was curative. Her last PET scan was normal. Her last colonoscopy in 2002 was normal. Her last upper endoscopy in 2005 and prior to that in 2001 for dysphagia due to esophageal stricture at 32 cm. She was dilated with 48 and 52 [place] Maloney dilators. She is having some solid food dysphagia especially with large pills.   Past Medical History  Diagnosis Date  . Deep phlebothrombosis, postpartum, unspecified as to episode of care   . Bipolar I disorder, most recent episode (or current) depressed, severe, without mention of psychotic behavior   . Other and unspecified hyperlipidemia   . IBS (irritable bowel syndrome)   . GERD (gastroesophageal reflux disease)   . Allergic rhinitis, cause unspecified   . Hyperlipidemia   . Gastritis   . Depression   . Anxiety   . Leiomyosarcoma of uterus   . Migraine headache    Past Surgical History  Procedure Laterality Date  . Cesarean section  1986  . Vaginal hysterectomy    . Pubovaginal sling    . Neck surgery  2006    C spine fusion    reports that she has never smoked. She has never used smokeless tobacco. She reports that she does not drink alcohol or use illicit drugs. family history includes Arthritis in her mother; Coronary artery disease in her father; Diabetes in her maternal  grandmother; Heart disease in her father and mother; Hypertension in her father and mother; Irritable bowel syndrome in her brother, father, and sister; and Migraines in her mother. Allergies  Allergen Reactions  . Codeine     REACTION: Disoriented  . Morphine And Related         Review of Systems:  The remainder of the 10 point ROS is negative except as outlined in H&P   Physical Exam: General appearance  Well developed, in no distress. She has lost about 30 pounds.  Eyes- non icteric. HEENT nontraumatic, normocephalic. Mouth no lesions, tongue papillated, no cheilosis. Neck supple without adenopathy, thyroid not enlarged, no carotid bruits, no JVD. Lungs Clear to auscultation bilaterally. Cor normal S1, normal S2, regular rhythm, no murmur,  quiet precordium. Abdomen: Soft nontender with well-healed surgical scar.  Rectal: Deferred. Extremities no pedal edema. Skin no lesions, post chemo alopecia.  Neurological alert and oriented x 3. Psychological normal mood and affect.  Assessment and Plan:  Problem #33 62 year old white female who is due for a screening colonoscopy. This should be scheduled for September 2014 which would be 3 months following her last chemotherapy treatment. She will use MiraLax 9 g daily for post chemo constipation.  Problem #2 Solid food dysphagia and history of esophageal stricture. We will schedule an upper endoscopy with possible esophageal dilation to be completed the same day as colonoscopy. Because of her breakthrough symptoms, I would increase her Prilosec to  40 mg daily.  Problem #3 Leiomyosarcoma of the vaginal wall. Patient is status post resection and chemotherapy and is followed at [facility] in [place] by Dr Pippitt and Dr Seward Carol.    12/05/2012 Lina Sar

## 2012-12-06 ENCOUNTER — Encounter: Payer: Self-pay | Admitting: Internal Medicine

## 2013-01-17 ENCOUNTER — Encounter: Payer: Self-pay | Admitting: Internal Medicine

## 2013-01-17 ENCOUNTER — Ambulatory Visit (AMBULATORY_SURGERY_CENTER): Payer: BC Managed Care – PPO | Admitting: Internal Medicine

## 2013-01-17 VITALS — BP 135/87 | HR 54 | Temp 96.8°F | Resp 18 | Ht 62.0 in | Wt 121.1 lb

## 2013-01-17 DIAGNOSIS — K6289 Other specified diseases of anus and rectum: Secondary | ICD-10-CM

## 2013-01-17 DIAGNOSIS — Z1211 Encounter for screening for malignant neoplasm of colon: Secondary | ICD-10-CM

## 2013-01-17 DIAGNOSIS — K5730 Diverticulosis of large intestine without perforation or abscess without bleeding: Secondary | ICD-10-CM

## 2013-01-17 DIAGNOSIS — K219 Gastro-esophageal reflux disease without esophagitis: Secondary | ICD-10-CM

## 2013-01-17 DIAGNOSIS — R1319 Other dysphagia: Secondary | ICD-10-CM

## 2013-01-17 MED ORDER — SODIUM CHLORIDE 0.9 % IV SOLN: 500.0000 mL | INTRAVENOUS | Status: DC

## 2013-01-17 NOTE — Progress Notes (Signed)
Patient did not experience any of the following events: a burn prior to discharge; a fall within the facility; wrong site/side/patient/procedure/implant event; or a hospital transfer or hospital admission upon discharge from the facility. (G8907) Patient did not have preoperative order for IV antibiotic SSI prophylaxis. (G8918)  

## 2013-01-17 NOTE — Progress Notes (Signed)
Called to room to assist during endoscopic procedure.  Patient ID and intended procedure confirmed with present staff. Received instructions for my participation in the procedure from the performing physician.  

## 2013-01-17 NOTE — Op Note (Signed)
[facility] [address] [place], [address]   COLONOSCOPY PROCEDURE REPORT  PATIENT: Charlotte, Grant.  MR#: #[id number] BIRTHDATE: 01/02/1951 , 62  yrs. old GENDER: Female ENDOSCOPIST: Hart Carwin, MD REFERRED XB:JYNW PROCEDURE DATE:  01/17/2013 PROCEDURE:   Colonoscopy, screening First Screening Colonoscopy - Avg.  risk and is 50 yrs.  old or older - No.  Prior Negative Screening - Now for repeat screening. 10 or more years since last screening  History of Adenoma - Now for follow-up colonoscopy & has been > or = to 3 yrs.  N/A  Polyps Removed Today? No.  Recommend repeat exam, <10 yrs? No. ASA CLASS:   Class II INDICATIONS:Average risk patient for colon cancer and last colonoscopy 2002 was normal. MEDICATIONS: MAC sedation, administered by CRNA and propofol (Diprivan) 100mg  IV  DESCRIPTION OF PROCEDURE:   After the risks benefits and alternatives of the procedure were thoroughly explained, informed consent was obtained.  A digital rectal exam revealed no abnormalities of the rectum.   The LB [id number]-[id number]  endoscope was introduced through the anus and advanced to the cecum, which was identified by both the appendix and ileocecal valve. No adverse events experienced.   The quality of the prep was good, using MoviPrep  The instrument was then slowly withdrawn as the colon was fully examined.      COLON FINDINGS: Small internal hemorrhoids were found.   Mild diverticulosis was noted in the sigmoid colon. Biopsies of the rectal mucosa were taken to r/o radiation proctitis Retroflexed views revealed no abnormalities. The time to cecum=7 minutes 43 seconds.  Withdrawal time=7 minutes 44 seconds.  The scope was withdrawn and the procedure completed. COMPLICATIONS: There were no complications.  ENDOSCOPIC IMPRESSION: 1.   Small internal hemorrhoids 2.   Mild diverticulosis was noted in the sigmoid colon 3. rectal biopsies to r/o radiation  proctitis RECOMMENDATIONS: High fiber diet topical cortisone cream 1% to apply to irritated rectum  eSigned:  Hart Carwin, MD 01/17/2013 2:28 PM   cc:   PATIENT NAME:  [name]. MR#: #[id number]

## 2013-01-17 NOTE — Op Note (Signed)
[facility] [address] [place], [address]   ENDOSCOPY PROCEDURE REPORT  PATIENT: [name], [name].  MR#: #[id number] BIRTHDATE: [birth year] , 62  yrs. old GENDER: Female ENDOSCOPIST: Hart Carwin, MD REFERRED BY:  Jacques Navy, M.D. PROCEDURE DATE:  01/17/2013 PROCEDURE:  EGD, diagnostic and Maloney dilation of esophagus ASA CLASS:     Class II INDICATIONS:  Dysphagia.   prior EGD 2001,2005,dilated with 6F and 32F dilator. MEDICATIONS: MAC sedation, administered by CRNA and propofol (Diprivan) 100mg  IV TOPICAL ANESTHETIC: Cetacaine Spray  DESCRIPTION OF PROCEDURE: After the risks benefits and alternatives of the procedure were thoroughly explained, informed consent was obtained.  The LB [id number] endoscope was introduced through the mouth and advanced to the second portion of the duodenum. Without limitations.  The instrument was slowly withdrawn as the mucosa was fully examined.      [esophagus: Proximal and mid and distal esophageal mucosa appeared normal. There was no discrete stricture but there was slight narrowing at the GE junction. There was no significant hiatal hernia Stomach: The gastric mucosa appeared normal rugal folds unremarkable. Gastric antrum and pyloric outlet were normal. Retroflexion of the endoscope revealed normal fundus and cardia Duodenum: Duodenal bulb and descending duodenum appeared normal  Maloney dilators 48 French passed empirically through the distal esophagus without resistance. There was no blood on the dilator         The scope was then withdrawn from the patient and the procedure completed.  COMPLICATIONS: There were no complications. ENDOSCOPIC IMPRESSION: essentially normal upper endoscopy of the esophagus stomach and duodenum, status post passage of 48 French Maloney dilators for dysphagia RECOMMENDATIONS: 1.  Anti-reflux regimen to be follow 2.  Continue current medications  REPEAT EXAM: no  recall  eSigned:  Hart Carwin, MD 01/17/2013 2:22 PM   CC:  PATIENT NAME:  [name], [name]. MR#: #[id number]

## 2013-01-17 NOTE — Patient Instructions (Addendum)
YOU HAD AN ENDOSCOPIC PROCEDURE TODAY AT THE [facility]: Refer to the procedure report that was given to you for any specific questions about what was found during the examination.  If the procedure report does not answer your questions, please call your gastroenterologist to clarify.  If you requested that your care partner not be given the details of your procedure findings, then the procedure report has been included in a sealed envelope for you to review at your convenience later.  YOU SHOULD EXPECT: Some feelings of bloating in the abdomen. Passage of more gas than usual.  Walking can help get rid of the air that was put into your GI tract during the procedure and reduce the bloating. If you had a lower endoscopy (such as a colonoscopy or flexible sigmoidoscopy) you may notice spotting of blood in your stool or on the toilet paper. If you underwent a bowel prep for your procedure, then you may not have a normal bowel movement for a few days.  DIET: FOLLOW DILATION DIET  ACTIVITY: Your care partner should take you home directly after the procedure.  You should plan to take it easy, moving slowly for the rest of the day.  You can resume normal activity the day after the procedure however you should NOT DRIVE or use heavy machinery for 24 hours (because of the sedation medicines used during the test).    SYMPTOMS TO REPORT IMMEDIATELY: A gastroenterologist can be reached at any hour.  During normal business hours, 8:30 AM to 5:00 PM Monday through Friday, call [phone].  After hours and on weekends, please call the GI answering service at [phone] who will take a message and have the physician on call contact you.   Following lower endoscopy (colonoscopy or flexible sigmoidoscopy):  Excessive amounts of blood in the stool  Significant tenderness or worsening of abdominal pains  Swelling of the abdomen that is new, acute  Fever of 100F or higher  Following upper  endoscopy (EGD)  Vomiting of blood or coffee ground material  New chest pain or pain under the shoulder blades  Painful or persistently difficult swallowing  New shortness of breath  Fever of 100F or higher  Black, tarry-looking stools  FOLLOW UP: If any biopsies were taken you will be contacted by phone or by letter within the next 1-3 weeks.  Call your gastroenterologist if you have not heard about the biopsies in 3 weeks.  Our staff will call the home number listed on your records the next business day following your procedure to check on you and address any questions or concerns that you may have at that time regarding the information given to you following your procedure. This is a courtesy call and so if there is no answer at the home number and we have not heard from you through the emergency physician on call, we will assume that you have returned to your regular daily activities without incident.  SIGNATURES/CONFIDENTIALITY: You and/or your care partner have signed paperwork which will be entered into your electronic medical record.  These signatures attest to the fact that that the information above on your After Visit Summary has been reviewed and is understood.  Full responsibility of the confidentiality of this discharge information lies with you and/or your care-partner.  Resume medications. Information given on diverticulosis, high fiber  Diet and dilation diet with discharge instructions.

## 2013-01-17 NOTE — Progress Notes (Signed)
Pt denies the dx of bipolar in her history.  I was unable to remove it. Maw

## 2013-01-18 ENCOUNTER — Telehealth: Payer: Self-pay

## 2013-01-18 NOTE — Telephone Encounter (Signed)
Left message on answering machine. 

## 2013-01-23 ENCOUNTER — Encounter: Payer: Self-pay | Admitting: Internal Medicine

## 2013-04-11 ENCOUNTER — Telehealth: Payer: Self-pay | Admitting: Internal Medicine

## 2013-04-11 DIAGNOSIS — K8020 Calculus of gallbladder without cholecystitis without obstruction: Secondary | ICD-10-CM

## 2013-04-11 NOTE — Telephone Encounter (Signed)
Spoke with patient and she saw her PCP and had an ultrasound of abdomen. She has gallstones. She is wanting to have her gallbladder removed. Dr. Nickola Major is faxing all of her records to [place]. She wants to be referred to a surgeon. She wants to be sure she sees the surgeon Dr. Juanda Chance would recommend. She wants to be scheduled with someone here ASAP. Scheduled with Mike Gip, PA on 04/16/13 at 2:00 PM.

## 2013-04-11 NOTE — Telephone Encounter (Signed)
Left a message for patient to call me. 

## 2013-04-12 NOTE — Telephone Encounter (Signed)
Please set her up with Dr Abbey Chatters  Or Dr Johna Sheriff.

## 2013-04-16 ENCOUNTER — Encounter: Payer: Self-pay | Admitting: Physician Assistant

## 2013-04-16 ENCOUNTER — Ambulatory Visit (INDEPENDENT_AMBULATORY_CARE_PROVIDER_SITE_OTHER): Payer: BC Managed Care – PPO | Admitting: Physician Assistant

## 2013-04-16 VITALS — BP 120/70 | HR 60 | Ht 62.0 in | Wt 121.2 lb

## 2013-04-16 DIAGNOSIS — K8020 Calculus of gallbladder without cholecystitis without obstruction: Secondary | ICD-10-CM

## 2013-04-16 DIAGNOSIS — R5381 Other malaise: Secondary | ICD-10-CM

## 2013-04-16 DIAGNOSIS — C55 Malignant neoplasm of uterus, part unspecified: Secondary | ICD-10-CM

## 2013-04-16 DIAGNOSIS — D351 Benign neoplasm of parathyroid gland: Secondary | ICD-10-CM

## 2013-04-16 HISTORY — DX: Malignant neoplasm of uterus, part unspecified: C55

## 2013-04-16 NOTE — Progress Notes (Signed)
Reviewed and agree. We have to see the sono report! Please obtain the report before she sees Dr Abbey Chatters.

## 2013-04-16 NOTE — Patient Instructions (Signed)
You have an appointment with [facility], Dr. Abbey Chatters. We have given you a brochure on a Low Fat Diet. We have given you samples of Zegerid 40 mg.   We are doing a referral to Dr. Everardo All, Endocrinologist . We will call you with the appointment.

## 2013-04-16 NOTE — Progress Notes (Signed)
Subjective:    Patient ID: [name], female    DOB: 12-05-1950, 62 y.o.   MRN: [id number]  HPI [name] is a very nice 62 year old white female known to Dr. 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 She has history of GERD, IBS, and diverticulosis. 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 She was seen by Dr. Juanda Chance this summer and underwent EGD and colonoscopy in September of 2014. EGD was normal she did have a Maloney dilation for complaints of dysphagia and colonoscopy showed small internal hemorrhoids and diverticulosis. 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 Patient had called last week stating that she was diagnosed with gallstones by her primary care physician and wanted to be seen here to discuss surgical referral. Today she states that she really has been feeling poorly better since she underwent chemotherapy and radiation and feels that she has not regained a significant amount of her strength. She reports ongoing problems with generalized fatigue and weakness as well as poor appetite. Her weight overall has been stable though she did lose when she was undergoing the chemotherapy and radiation. She says she generally has 1 loose bowel movement per day. Now over the past couple of weeks she has been having problems with upper abdominal  Pain. She had an episode of fairly intense substernal pain about 2 weeks ago that woke her up in the middle of the night lasted for about 3 hours total until she was seen in the emergency room and given analgesic. This was not associated with any shortness of breath or diaphoresis. After the pain had been present for an hour or so she did develop nausea followed by vomiting. Cardiac workup in the ER was unremarkable and and subsequent workup by her primary care physician with upper  abdominal ultrasound apparently has shown multiple gallstones and no wall thickening. This report is per patient. We have requested a copy but did not have that available at the time of this dictation. She also had a gastric emptying scan that showed 65% emptying at 2 hours with normal being greater than 70% emptying at 2 hours She does have some mild symptoms of early satiety belching burping etc. She also mentions that she was recently found to have a low vitamin D level as well as elevated TSH and her Synthroid has been increased. 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    Review of Systems  Constitutional: Positive for activity change, appetite change and fatigue.  HENT: Negative.   Eyes: Negative.   Respiratory: Negative.   Cardiovascular: Negative.   Gastrointestinal: Positive for nausea and abdominal pain.  Endocrine: Negative.   Genitourinary: Negative.   Musculoskeletal: Negative.   Skin: Negative.   Allergic/Immunologic: Negative.   Neurological: Positive for weakness.  Hematological: Negative.   Psychiatric/Behavioral: Negative.    Outpatient Prescriptions Prior to Visit  Medication Sig Dispense Refill  . ALPRAZolam (XANAX) 0.5 MG tablet Take 0.5 mg by mouth at bedtime as needed for sleep.      . cetirizine (ZYRTEC) 10 MG tablet Take 10 mg by mouth at bedtime.      . Cholecalciferol (VITAMIN D-3) 1000 UNITS CAPS Take 1 capsule by mouth once a week.      . citalopram (CELEXA) 20 MG tablet Take  40 mg by mouth daily.      Marland Kitchen estradiol (ESTRACE) 1 MG tablet take 1 tablet by mouth once daily  90 tablet  3  . Lactobacillus Rhamnosus, GG, (CULTURELLE PO) Take 1 capsule by mouth daily.      Marland Kitchen levothyroxine (SYNTHROID, LEVOTHROID) 50 MCG tablet Take 50 mcg by mouth daily before breakfast.      . MULTIPLE VITAMIN PO Take by mouth.        Marland Kitchen omeprazole (PRILOSEC) 40 MG capsule Take 1 capsule (40 mg total) by  mouth daily.  30 capsule  3  . topiramate (TOPAMAX) 100 MG tablet Take 100 mg by mouth at bedtime.      . Azelastine HCl 0.15 % SOLN       . ciprofloxacin (CIPRO) 500 MG tablet       . venlafaxine (EFFEXOR) 75 MG tablet        No facility-administered medications prior to visit.   Allergies  Allergen Reactions  . Codeine     REACTION: Disoriented  . Morphine And Related     Sever headache   Patient Active Problem List   Diagnosis Date Noted  . 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  . Elevated blood pressure reading without diagnosis of hypertension 10/30/2010  . Healthcare maintenance 10/30/2010  . MUSCLE PAIN 07/03/2008  . BIPLR I MOST RECENT EPIS DPRS SEV NO PSYCHOT BHV 02/14/2008  . DEEP PHLEBOTHROMBOSIS PP UNSPEC AS EPIS CARE 02/14/2008  . HYPERLIPIDEMIA 03/04/2007  . ALLERGIC RHINITIS 03/04/2007  . GERD 03/04/2007  . IRRITABLE BOWEL SYNDROME, HX OF 03/04/2007   History  Substance Use Topics  . Smoking status: Never Smoker   . Smokeless tobacco: Never Used  . Alcohol Use: No   family history includes Arthritis in her mother; Coronary artery disease in her father; Diabetes in her maternal grandmother; Heart disease in her father and mother; Hypertension in her father and mother; Irritable bowel syndrome in her brother, father, and sister; Migraines in her mother. There is no history of Colon cancer, Esophageal cancer, Rectal cancer, or Stomach cancer.     Objective:   Physical Exam  well-developed white female in no acute distress, pleasant blood pressure 120/70 pulse 60 height 5 foot 2 weight 121. HEENT; nontraumatic normocephalic EOMI PERRLA sclera anicteric, Supple no JVD, Cardiovascular; regular rate and rhythm with S1-S2 no murmur or gallop, capillary clear bilaterally, Abdomen; soft mildly tender in the epigastrium right upper quadrant no guarding or rebound no palpable mass or hepatosplenomegaly bowel sounds are active, Rectal; exam not done, Extremities; no clubbing  cyanosis or edema skin warm and dry, Psych; mood and affect normal and appropriate        Assessment & Plan:  #63 62 year old female with persistent significant fatigue after completing chemotherapy and radiation for a uterine leiomyosarcoma  Treatment completed May 2014. Fatigue may be multifactorial with vitamin D deficiency and hypothyroidism #2 recent episode of severe substernal pain nausea and vomiting consistent with biliary colic and recent ultrasound showing cholelithiasis #3 GERD #4 parathyroid adenoma  Plan; referral has been made to Dr. Abbey Chatters ,[facility] for cholelithiasis Start trial of Zegerid 40 mg by mouth every morning We  discussed low fat diet and also frequent small low-residue feedings for very minimal symptoms of delayed gastric emptying We will also obtain a referral to Dr. Everardo All for endocrine eval at patient's request

## 2013-04-16 NOTE — Telephone Encounter (Signed)
Spoke with Liborio Nixon at [facility]. Scheduled patient with Dr. Abbey Chatters on 04/24/13 at 2:30/3:00 PM. Left a message for patient to call me.

## 2013-04-16 NOTE — Telephone Encounter (Signed)
Patient given appointment for Dr. Abbey Chatters on 04/24/13 at 2:30 /3:00 PM.

## 2013-04-24 ENCOUNTER — Encounter (INDEPENDENT_AMBULATORY_CARE_PROVIDER_SITE_OTHER): Payer: Self-pay | Admitting: General Surgery

## 2013-04-24 ENCOUNTER — Ambulatory Visit (INDEPENDENT_AMBULATORY_CARE_PROVIDER_SITE_OTHER): Payer: BC Managed Care – PPO | Admitting: General Surgery

## 2013-04-24 VITALS — BP 124/68 | HR 60 | Temp 98.0°F | Resp 18 | Ht 64.0 in | Wt 122.0 lb

## 2013-04-24 DIAGNOSIS — K8020 Calculus of gallbladder without cholecystitis without obstruction: Secondary | ICD-10-CM | POA: Insufficient documentation

## 2013-04-24 NOTE — Progress Notes (Signed)
Patient ID: [name], female   DOB: [birth year], 62 y.o.   MRN: [id number]  Chief Complaint  Patient presents with  . Establish Care    G.B    HPI [name] is a 62 y.o. female.   HPI  She is referred by Dr. Juanda Chance for further evaluation of epigastric pain and gallstones.  She had a severe episode of epigastric pain that radiated through to her back somewhat in November. She presented to the emergency department and a cardiac etiology was ruled out. She subsequently underwent an abdominal ultrasound which demonstrated numerous small gallstones. No common bile duct diameter. No evidence of acute cholecystitis. This episode occurred after eating chicken Alfredo. She also has a known mild gastroparesis. She's been sent over here to discuss possible laparoscopic cholecystectomy. There is a family history of gallbladder disease.  She states every since completing her chemotherapy for leiomyosarcoma of the vaginal cuff, she's had gastrointestinal problems.  Past Medical History  Diagnosis Date  . Deep phlebothrombosis, postpartum, unspecified as to episode of care   . Bipolar I disorder, most recent episode (or current) depressed, severe, without mention of psychotic behavior   . Other and unspecified hyperlipidemia   . IBS (irritable bowel syndrome)   . GERD (gastroesophageal reflux disease)   . Allergic rhinitis, cause unspecified   . Hyperlipidemia   . Gastritis   . Depression   . Anxiety   . Migraine headache   . Leiomyosarcoma of uterus     vaginal wall  . Asthma   . Blood transfusion without reported diagnosis   . Parathyroid adenoma 2014  . Status post chemotherapy 2014    Gastroparesis  Past Surgical History  Procedure Laterality Date  . Cesarean section  1986  . Vaginal hysterectomy    . Pubovaginal sling    . Neck surgery  2006    C spine fusion  . Laprotomy  04/2012    for cancer    Family History  Problem Relation Age of Onset  . Coronary artery disease  Father   . Heart disease Father   . Hypertension Father   . Irritable bowel syndrome Father   . Diabetes Maternal Grandmother   . Arthritis Mother   . Hypertension Mother   . Migraines Mother   . Heart disease Mother   . Irritable bowel syndrome Sister   . Irritable bowel syndrome Brother   . Colon cancer Neg Hx   . Esophageal cancer Neg Hx   . Rectal cancer Neg Hx   . Stomach cancer Neg Hx       Social History History  Substance Use Topics  . Smoking status: Never Smoker   . Smokeless tobacco: Never Used  . Alcohol Use: No    Allergies  Allergen Reactions  . Codeine     REACTION: Disoriented  . Morphine And Related     Sever headache    Current Outpatient Prescriptions  Medication Sig Dispense Refill  . ALPRAZolam (XANAX) 0.5 MG tablet Take 0.5 mg by mouth at bedtime as needed for sleep.      . cetirizine (ZYRTEC) 10 MG tablet Take 10 mg by mouth at bedtime.      . Cholecalciferol (VITAMIN D-3) 1000 UNITS CAPS Take 2 capsules by mouth once a week.       . citalopram (CELEXA) 20 MG tablet Take 40 mg by mouth daily.      Marland Kitchen estradiol (ESTRACE) 1 MG tablet take 1 tablet by mouth once  daily  90 tablet  3  . Lactobacillus Rhamnosus, GG, (CULTURELLE PO) Take 1 capsule by mouth daily.      Marland Kitchen levothyroxine (SYNTHROID, LEVOTHROID) 50 MCG tablet Take 100 mcg by mouth daily before breakfast.       . MULTIPLE VITAMIN PO Take by mouth.        Marland Kitchen omeprazole (PRILOSEC) 40 MG capsule Take 1 capsule (40 mg total) by mouth daily.  30 capsule  3  . Oxymetazoline HCl (NASAL SPRAY NA) Place into the nose. Nasal Cort spray      . topiramate (TOPAMAX) 100 MG tablet Take 100 mg by mouth at bedtime.       No current facility-administered medications for this visit.    Review of Systems Review of Systems  Respiratory: Positive for cough.   Gastrointestinal: Positive for nausea, abdominal pain and diarrhea.  Neurological: Positive for weakness and headaches.    Blood pressure 124/68,  pulse 60, temperature 98 F (36.7 C), resp. rate 18, height 5\' 4"  (1.626 m), weight 122 lb (55.339 kg).  Physical Exam Physical Exam  Constitutional:  Thin female.  Appears anxious.  HENT:  Head: Normocephalic and atraumatic.  Eyes: EOM are normal. No scleral icterus.  Neck: Neck supple.  Cardiovascular: Normal rate and regular rhythm.   Pulmonary/Chest: Effort normal and breath sounds normal.  Abdominal: Soft. She exhibits no mass. There is no tenderness.  Lower midline scar.  Musculoskeletal: She exhibits no edema.  Lymphadenopathy:    She has no cervical adenopathy.  Neurological: She is alert.  Skin: Skin is warm and dry.  Psychiatric: Judgment and thought content normal.    Data Reviewed Ultrasound report. Note from Dr. Juanda Chance. Gastric emptying scan report.  Assessment    Recent epigastric pain highly suggestive of biliary colic after eating chicken Alfredo. She has known gallstones. Also has some chronic nausea which could be related to her gastroparesis.     Plan    We discussed a laparoscopic cholecystectomy with cholangiogram. We did discuss the fact that it may relieve some of her symptoms but she may still have some problems because of her gastroparesis. She is interested in proceeding.  I have explained the procedure, risks, and aftercare of cholecystectomy.  Risks include but are not limited to bleeding, infection, wound problems, anesthesia, diarrhea, bile leak, injury to common bile duct/liver/intestine.  She seems to understand and agrees to proceed.         Jaelon Gatley J 04/24/2013, 5:33 PM

## 2013-04-24 NOTE — Patient Instructions (Signed)
[facility] ______[facility] LAPAROSCOPIC SURGERY: POST OP INSTRUCTIONS Always review your discharge instruction sheet given to you by the facility where your surgery was performed. IF YOU HAVE DISABILITY OR FAMILY LEAVE FORMS, YOU MUST BRING THEM TO THE OFFICE FOR PROCESSING.   DO NOT GIVE THEM TO YOUR DOCTOR.  1. A prescription for pain medication may be given to you upon discharge.  Take your pain medication as prescribed, if needed.  If narcotic pain medicine is not needed, then you may take acetaminophen (Tylenol) or ibuprofen (Advil) as needed. 2. Take your usually prescribed medications unless otherwise directed. 3. If you need a refill on your pain medication, please contact your pharmacy.  They will contact our office to request authorization. Prescriptions will not be filled after 5pm or on week-ends. 4. You should follow a light diet the first few days after arrival home, such as soup and crackers, etc.  Be sure to include lots of fluids daily. 5. Most patients will experience some swelling and bruising in the area of the incisions.  Ice packs will help.  Swelling and bruising can take several days to resolve.  6. It is common to experience some constipation if taking pain medication after surgery.  Increasing fluid intake and taking a stool softener (such as Colace) will usually help or prevent this problem from occurring.  A mild laxative (Milk of Magnesia or Miralax) should be taken according to package instructions if there are no bowel movements after 48 hours. 7. Unless discharge instructions indicate otherwise, you may remove your bandages 72 hours after surgery, and you may shower at that time.  You may have steri-strips (small skin tapes) in place directly over the incision.  These strips should be left on the skin for 7-10 days.  If your surgeon used skin glue on the incision, you may shower in 24 hours.  The glue will flake off over the next 2-3 weeks.  Any sutures or  staples will be removed at the office during your follow-up visit. 8. ACTIVITIES:  You may resume regular (light) daily activities beginning the next day-such as daily self-care, walking, climbing stairs-gradually increasing activities as tolerated.  You may have sexual intercourse when it is comfortable.  Refrain from any heavy lifting or straining for two weeks.  a. You may drive when you are no longer taking prescription pain medication, you can comfortably wear a seatbelt, and you can safely maneuver your car and apply brakes. b. RETURN TO WORK:  __________________________________________________________ 9. You should see your doctor in the office for a follow-up appointment approximately 2-3 weeks after your surgery.  Make sure that you call for this appointment within a day or two after you arrive home to insure a convenient appointment time. 10. OTHER INSTRUCTIONS: __________________________________________________________________________________________________________________________ __________________________________________________________________________________________________________________________ WHEN TO CALL YOUR DOCTOR: 1. Fever over 101.0 2. Inability to urinate 3. Continued bleeding from incision. 4. Increased pain, redness, or drainage from the incision. 5. Increasing abdominal pain  The clinic staff is available to answer your questions during regular business hours.  Please don't hesitate to call and ask to speak to one of the nurses for clinical concerns.  If you have a medical emergency, go to the nearest emergency room or call 911.  A surgeon from [facility] is always on call at the hospital. [address], [address], [place], [place]  [address] ? [address] [address], [place], [place]   [address] [phone] ? [phone] ? FAX [phone] Web site: [email or web address]

## 2013-04-25 ENCOUNTER — Encounter: Payer: Self-pay | Admitting: Endocrinology

## 2013-04-25 ENCOUNTER — Ambulatory Visit (INDEPENDENT_AMBULATORY_CARE_PROVIDER_SITE_OTHER): Payer: BC Managed Care – PPO | Admitting: Endocrinology

## 2013-04-25 VITALS — BP 118/78 | HR 78 | Temp 97.6°F | Ht 64.0 in | Wt 123.0 lb

## 2013-04-25 DIAGNOSIS — E559 Vitamin D deficiency, unspecified: Secondary | ICD-10-CM

## 2013-04-25 DIAGNOSIS — E039 Hypothyroidism, unspecified: Secondary | ICD-10-CM

## 2013-04-25 HISTORY — DX: Vitamin D deficiency, unspecified: E55.9

## 2013-04-25 HISTORY — DX: Hypothyroidism, unspecified: E03.9

## 2013-04-25 LAB — TSH: TSH: 0.26 u[IU]/mL — ABNORMAL LOW (ref 0.35–5.50)

## 2013-04-25 MED ORDER — LEVOTHYROXINE SODIUM 75 MCG PO TABS: 75.0000 ug | ORAL_TABLET | Freq: Every day | ORAL | Status: DC

## 2013-04-25 NOTE — Patient Instructions (Signed)
blood tests are being requested for you today.  We'll contact you with results. Please come back for a follow-up appointment in 6 months  

## 2013-04-25 NOTE — Progress Notes (Signed)
Subjective:    Patient ID: [name], female    DOB: 1951-05-06, 62 y.o.   MRN: [id number]  HPI In 2013, pt was noted to have primary hypothyroidism.  She was rx'ed synthroid, and has been on it since.  Synthroid was doubled 1 month ago. She also has h/o hyperparathyroidism, but it is not known if this is primary or secondary. Vit-d was doubled 1 month ago.   She has 1 year of moderate pain throughout the body, and assoc fatigue. Past Medical History  Diagnosis Date  . Deep phlebothrombosis, postpartum, unspecified as to episode of care   . Bipolar I disorder, most recent episode (or current) depressed, severe, without mention of psychotic behavior   . Other and unspecified hyperlipidemia   . IBS (irritable bowel syndrome)   . GERD (gastroesophageal reflux disease)   . Allergic rhinitis, cause unspecified   . Hyperlipidemia   . Gastritis   . Depression   . Anxiety   . Migraine headache   . Leiomyosarcoma of uterus     vaginal wall  . Asthma   . Blood transfusion without reported diagnosis   . Parathyroid adenoma 2014  . Status post chemotherapy 2014    Past Surgical History  Procedure Laterality Date  . Cesarean section  1986  . Vaginal hysterectomy    . Pubovaginal sling    . Neck surgery  2006    C spine fusion  . Laprotomy  04/2012    for cancer    History   Social History  . Marital Status: Married    Spouse Name: N/A    Number of Children: 2  . 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 Smoking status: Never Smoker   . Smokeless tobacco: Never Used  . Alcohol Use: No  . Drug Use: No  . Sexual Activity: Yes    Partners: Male   Other Topics Concern  . Not on file   Social History Narrative   Married 4 years- divorced; married 10 years- divorced. Serially monogamous relationships. Remarried March '11. 2 sons- '82, '87. 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 (June "12). Lives in her own home    Current Outpatient Prescriptions on File Prior to Visit  Medication Sig Dispense Refill  . ALPRAZolam (XANAX) 0.5 MG tablet Take 0.5 mg by mouth at bedtime as needed for sleep.      . cetirizine (ZYRTEC) 10 MG tablet Take 10 mg by mouth at bedtime.      . Cholecalciferol (VITAMIN D-3) 1000 UNITS CAPS Take 2 capsules by mouth once a week.       . citalopram (CELEXA) 20 MG tablet Take 40 mg by mouth daily.      Marland Kitchen estradiol (ESTRACE) 1 MG tablet take 1 tablet by mouth once daily  90 tablet  3  . Lactobacillus Rhamnosus, GG, (CULTURELLE PO) Take 1 capsule by mouth daily.      . MULTIPLE VITAMIN PO Take by mouth.        Marland Kitchen omeprazole (PRILOSEC) 40 MG capsule Take 1 capsule (40 mg total) by mouth daily.  30 capsule  3  . Oxymetazoline HCl (NASAL SPRAY NA) Place into the nose. Nasal Cort spray      . topiramate (TOPAMAX) 100 MG tablet Take 100 mg by mouth at bedtime.       No  current facility-administered medications on file prior to visit.    Allergies  Allergen Reactions  . Codeine     REACTION: Disoriented  . Morphine And Related     Severe headache    Family History  Problem Relation Age of Onset  . Coronary artery disease Father   . Heart disease Father   . Hypertension Father   . Irritable bowel syndrome Father   . Diabetes Maternal Grandmother   . Arthritis Mother   . Hypertension Mother   . Migraines Mother   . Heart disease Mother   . Irritable bowel syndrome Sister   . Irritable bowel syndrome Brother   . Colon cancer Neg Hx   . Esophageal cancer Neg Hx   . Rectal cancer Neg Hx   . Stomach cancer Neg Hx    BP 118/78  Pulse 78  Temp(Src) 97.6 F (36.4 C) (Oral)  Ht 5\' 4"  (1.626 m)  Wt 123 lb (55.792 kg)  BMI 21.10 kg/m2  SpO2 90%  Review of Systems denies hair loss, cramps, sob, memory loss, constipation, blurry vision, dry skin, rhinorrhea, easy bruising, and syncope    She  reports headache, abd pain, and mild depression.  She lost weight with chemotx.  She has mild numbness of the fingers.      Objective:   Physical Exam VS: see vs page GEN: no distress HEAD: head: no deformity eyes: no periorbital swelling, no proptosis external nose and ears are normal mouth: no lesion seen NECK: supple, thyroid is not enlarged CHEST WALL: no deformity LUNGS:  Clear to auscultation CV: reg rate and rhythm, no murmur ABD: abdomen is soft, nontender.  no hepatosplenomegaly.  not distended.  no hernia MUSCULOSKELETAL: muscle bulk and strength are grossly normal.  no obvious joint swelling.  gait is normal and steady EXTEMITIES: no deformity. no edema PULSES: no carotid bruit NEURO:  cn 2-12 grossly intact.   readily moves all 4's.  sensation is intact to touch on the feet SKIN:  Normal texture and temperature.  No rash or suspicious lesion is visible.  Not diaphoretic. NODES:  None palpable at the neck PSYCH: alert, oriented x3.  Does not appear anxious nor depressed.   outside test results are reviewed: TPO antibodies: pos (i reviewed thyroid US report: normal) Lab Results  Component Value Date   TSH 0.26* 04/25/2013   Lab Results  Component Value Date   PTH 90.9* 04/25/2013   CALCIUM 9.9 04/25/2013   CAION 1.26 11/16/2008   Vit D=63    Assessment & Plan:  Chronic primary hypothyroidism.  He needs slightly decreased dosage. Vit-d deficiency: well-replaced now. Hyperparathyroidism, uncertain etiology.  We'll have to follow this for now.   Myalgias.  No endocrine cause is found.

## 2013-04-26 LAB — VITAMIN D 25 HYDROXY (VIT D DEFICIENCY, FRACTURES): Vit D, 25-Hydroxy: 63 ng/mL (ref 30–89)

## 2013-04-26 LAB — PTH, INTACT AND CALCIUM
Calcium: 9.9 mg/dL (ref 8.4–10.5)
PTH: 90.9 pg/mL — ABNORMAL HIGH (ref 14.0–72.0)

## 2013-04-30 ENCOUNTER — Encounter (HOSPITAL_COMMUNITY): Payer: Self-pay | Admitting: Pharmacy Technician

## 2013-04-30 ENCOUNTER — Other Ambulatory Visit (HOSPITAL_COMMUNITY): Payer: BC Managed Care – PPO

## 2013-04-30 ENCOUNTER — Encounter (HOSPITAL_COMMUNITY)
Admission: RE | Admit: 2013-04-30 | Discharge: 2013-04-30 | Disposition: A | Discharge status: Home / Self Care | Payer: BC Managed Care – PPO | Source: Ambulatory Visit | Attending: General Surgery | Admitting: General Surgery

## 2013-04-30 ENCOUNTER — Ambulatory Visit (HOSPITAL_COMMUNITY)
Admission: RE | Admit: 2013-04-30 | Discharge: 2013-04-30 | Disposition: A | Discharge status: Home / Self Care | Payer: BC Managed Care – PPO | Source: Ambulatory Visit | Attending: Anesthesiology | Admitting: Anesthesiology

## 2013-04-30 ENCOUNTER — Encounter (HOSPITAL_COMMUNITY): Payer: Self-pay

## 2013-04-30 DIAGNOSIS — J984 Other disorders of lung: Secondary | ICD-10-CM | POA: Insufficient documentation

## 2013-04-30 DIAGNOSIS — Z01818 Encounter for other preprocedural examination: Secondary | ICD-10-CM | POA: Insufficient documentation

## 2013-04-30 DIAGNOSIS — J45909 Unspecified asthma, uncomplicated: Secondary | ICD-10-CM | POA: Insufficient documentation

## 2013-04-30 DIAGNOSIS — Z01812 Encounter for preprocedural laboratory examination: Secondary | ICD-10-CM | POA: Insufficient documentation

## 2013-04-30 HISTORY — DX: Fibromyalgia: M79.7

## 2013-04-30 LAB — CBC WITH DIFFERENTIAL/PLATELET
Basophils Absolute: 0 10*3/uL (ref 0.0–0.1)
Basophils Relative: 1 % (ref 0–1)
Eosinophils Absolute: 0.1 10*3/uL (ref 0.0–0.7)
Eosinophils Relative: 3 % (ref 0–5)
HCT: 35.8 % — ABNORMAL LOW (ref 36.0–46.0)
Hemoglobin: 12.1 g/dL (ref 12.0–15.0)
Lymphocytes Relative: 33 % (ref 12–46)
Lymphs Abs: 1.7 10*3/uL (ref 0.7–4.0)
MCH: 31.1 pg (ref 26.0–34.0)
MCHC: 33.8 g/dL (ref 30.0–36.0)
MCV: 92 fL (ref 78.0–100.0)
Monocytes Absolute: 0.3 10*3/uL (ref 0.1–1.0)
Monocytes Relative: 5 % (ref 3–12)
Neutro Abs: 3 10*3/uL (ref 1.7–7.7)
Neutrophils Relative %: 58 % (ref 43–77)
Platelets: 251 10*3/uL (ref 150–400)
RBC: 3.89 MIL/uL (ref 3.87–5.11)
RDW: 14.1 % (ref 11.5–15.5)
WBC: 5.1 10*3/uL (ref 4.0–10.5)

## 2013-04-30 LAB — COMPREHENSIVE METABOLIC PANEL
ALT: 10 U/L (ref 0–35)
AST: 15 U/L (ref 0–37)
Albumin: 3.5 g/dL (ref 3.5–5.2)
Alkaline Phosphatase: 106 U/L (ref 39–117)
BUN: 13 mg/dL (ref 6–23)
CO2: 25 mEq/L (ref 19–32)
Calcium: 9.8 mg/dL (ref 8.4–10.5)
Chloride: 105 mEq/L (ref 96–112)
Creatinine, Ser: 0.92 mg/dL (ref 0.50–1.10)
GFR calc Af Amer: 75 mL/min — ABNORMAL LOW (ref >90)
GFR calc non Af Amer: 65 mL/min — ABNORMAL LOW (ref >90)
Glucose, Bld: 92 mg/dL (ref 70–99)
Potassium: 3.6 mEq/L (ref 3.5–5.1)
Sodium: 138 mEq/L (ref 135–145)
Total Bilirubin: 0.2 mg/dL — ABNORMAL LOW (ref 0.3–1.2)
Total Protein: 6.6 g/dL (ref 6.0–8.3)

## 2013-04-30 LAB — PROTIME-INR
INR: 1 (ref 0.00–1.49)
Prothrombin Time: 13 seconds (ref 11.6–15.2)

## 2013-04-30 NOTE — Pre-Procedure Instructions (Signed)
[name]  04/30/2013   Your procedure is scheduled on:  Thursday December 18 th at 1330 PM  Report to [facility] Main Entrance "A" at 1130 AM.  Call this number if you have problems the morning of surgery: [phone]   Remember:   Do not eat food or drink liquids after midnight.   Take these medicines the morning of surgery with A SIP OF WATER: Celexa, Synthroid ,Prilosec, and Nasal spray ; use and bring inhaler   Do not wear jewelry, make-up or nail polish.  Do not wear lotions, powders, or perfumes. You may wear deodorant.  Do not shave 48 hours prior to surgery.  Do not bring valuables to the hospital.  [facility] is not responsible for any belongings or valuables.               Contacts, dentures or bridgework may not be worn into surgery.  Leave suitcase in the car. After surgery it may be brought to your room.  For patients admitted to the hospital, discharge time is determined by your  treatment team.               Patients discharged the day of surgery will not be allowed to drive home.    Special Instructions: Shower using CHG 2 nights before surgery and the night before surgery.  If you shower the day of surgery use CHG.  Use special wash - you have one bottle of CHG for all showers.  You should use approximately 1/3 of the bottle for each shower.   Please read over the following fact sheets that you were given: Pain Booklet, Coughing and Deep Breathing and Surgical Site Infection Prevention

## 2013-05-01 ENCOUNTER — Encounter (HOSPITAL_COMMUNITY): Payer: Self-pay

## 2013-05-01 NOTE — Progress Notes (Signed)
Anesthesia Chart Review:  Patient is a 62 year old female scheduled for laparoscopic cholecystectomy on 05/03/13 by Dr. Abbey Chatters.  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  Endocrinologist is Dr. Everardo All. PCP is listed as Dr. Illene Regulus. I believe her oncologist is Dr. Herbert Spires at [facility].  CXR on 04/30/13 showed: 1. No acute cardiopulmonary disease.  2. Pulmonary nodule previously identified in the right pulmonary apex remains and appears slightly more prominent on today's chest x-ray. Chest CT suggested for further evaluation. I have routed results to Dr. Abbey Chatters in Epic for further review. If he does not have plans to address, and would rather follow-up be addressed by her PCP or oncologist then I asked that he have his staff forward results to one or both of them.    She had a PET scan on 11/20/12 at [facility] that showed no worrisome hypermetabolism identified. Nodule in the RUL is stable.  Scattered atelectasis in both lungs.  Retroperitoneal lymph node measures 1.1 cm without hypermetabolism.    CT of the chest on 01/20/12 showed: 1. Right upper lobe pulmonary nodule measuring 7 mm. If the patient is at high risk for bronchogenic carcinoma, follow-up chest  CT at 3-6 months is recommended. If the patient is at low risk for bronchogenic carcinoma, follow-up chest CT at 6-12 months is recommended.  2. Two hypodensities within the liver are too small to  characterize but likely represent benign cysts.  Preoperative labs noted.  She has a known RUL nodule since at least 01/2012 with negative PET scan less than six months ago.  From an anesthesia standpoint, I do not think that further evaluation would need to occur preoperatively.    Velna Ochs [facility]  Short Stay Center/Anesthesiology Phone [phone] 05/01/2013 2:40 PM

## 2013-05-02 MED ORDER — CEFAZOLIN SODIUM-DEXTROSE 2-3 GM-% IV SOLR
2.0000 g | INTRAVENOUS | Status: AC
  Administered 2013-05-03: 2 g via INTRAVENOUS
  Filled 2013-05-02: qty 50

## 2013-05-03 ENCOUNTER — Ambulatory Visit (HOSPITAL_COMMUNITY): Payer: BC Managed Care – PPO | Admitting: Anesthesiology

## 2013-05-03 ENCOUNTER — Ambulatory Visit (HOSPITAL_COMMUNITY)
Admission: RE | Admit: 2013-05-03 | Discharge: 2013-05-03 | Disposition: A | Discharge status: Home / Self Care | Payer: BC Managed Care – PPO | Source: Ambulatory Visit | Attending: General Surgery | Admitting: General Surgery

## 2013-05-03 ENCOUNTER — Ambulatory Visit (HOSPITAL_COMMUNITY): Payer: BC Managed Care – PPO

## 2013-05-03 ENCOUNTER — Encounter (HOSPITAL_COMMUNITY)
Admission: RE | Disposition: A | Discharge status: Home / Self Care | Payer: Self-pay | Source: Ambulatory Visit | Attending: General Surgery

## 2013-05-03 ENCOUNTER — Encounter (HOSPITAL_COMMUNITY): Payer: BC Managed Care – PPO | Admitting: Vascular Surgery

## 2013-05-03 ENCOUNTER — Encounter (HOSPITAL_COMMUNITY): Payer: Self-pay | Admitting: Anesthesiology

## 2013-05-03 DIAGNOSIS — K8010 Calculus of gallbladder with chronic cholecystitis without obstruction: Secondary | ICD-10-CM

## 2013-05-03 DIAGNOSIS — K8020 Calculus of gallbladder without cholecystitis without obstruction: Secondary | ICD-10-CM | POA: Insufficient documentation

## 2013-05-03 DIAGNOSIS — E785 Hyperlipidemia, unspecified: Secondary | ICD-10-CM | POA: Insufficient documentation

## 2013-05-03 DIAGNOSIS — F314 Bipolar disorder, current episode depressed, severe, without psychotic features: Secondary | ICD-10-CM | POA: Insufficient documentation

## 2013-05-03 HISTORY — PX: CHOLECYSTECTOMY: SHX55

## 2013-05-03 SURGERY — LAPAROSCOPIC CHOLECYSTECTOMY WITH INTRAOPERATIVE CHOLANGIOGRAM
Anesthesia: General | Site: Abdomen

## 2013-05-03 MED ORDER — SODIUM CHLORIDE 0.9 % IV SOLN
INTRAVENOUS | Status: DC | PRN
  Administered 2013-05-03: 15:00:00

## 2013-05-03 MED ORDER — MIDAZOLAM HCL 5 MG/5ML IJ SOLN
INTRAMUSCULAR | Status: DC | PRN
  Administered 2013-05-03: 2 mg via INTRAVENOUS

## 2013-05-03 MED ORDER — NEOSTIGMINE METHYLSULFATE 1 MG/ML IJ SOLN
INTRAMUSCULAR | Status: DC | PRN
  Administered 2013-05-03: 4 mg via INTRAVENOUS

## 2013-05-03 MED ORDER — 0.9 % SODIUM CHLORIDE (POUR BTL) OPTIME
TOPICAL | Status: DC | PRN
  Administered 2013-05-03: 1000 mL

## 2013-05-03 MED ORDER — LACTATED RINGERS IV SOLN
INTRAVENOUS | Status: DC | PRN
  Administered 2013-05-03 (×2): via INTRAVENOUS

## 2013-05-03 MED ORDER — FENTANYL CITRATE 0.05 MG/ML IJ SOLN
INTRAMUSCULAR | Status: AC
  Filled 2013-05-03: qty 2

## 2013-05-03 MED ORDER — PROPOFOL 10 MG/ML IV BOLUS
INTRAVENOUS | Status: DC | PRN
  Administered 2013-05-03: 160 mg via INTRAVENOUS

## 2013-05-03 MED ORDER — ONDANSETRON HCL 4 MG PO TABS: 4.0000 mg | ORAL_TABLET | ORAL | Status: DC | PRN

## 2013-05-03 MED ORDER — FENTANYL CITRATE 0.05 MG/ML IJ SOLN
INTRAMUSCULAR | Status: DC | PRN
  Administered 2013-05-03 (×3): 50 ug via INTRAVENOUS

## 2013-05-03 MED ORDER — EPHEDRINE SULFATE 50 MG/ML IJ SOLN
INTRAMUSCULAR | Status: DC | PRN
  Administered 2013-05-03: 10 mg via INTRAVENOUS

## 2013-05-03 MED ORDER — GLYCOPYRROLATE 0.2 MG/ML IJ SOLN
INTRAMUSCULAR | Status: DC | PRN
  Administered 2013-05-03: .8 mg via INTRAVENOUS

## 2013-05-03 MED ORDER — FENTANYL CITRATE 0.05 MG/ML IJ SOLN
25.0000 ug | INTRAMUSCULAR | Status: DC | PRN
  Administered 2013-05-03: 25 ug via INTRAVENOUS

## 2013-05-03 MED ORDER — ROCURONIUM BROMIDE 100 MG/10ML IV SOLN
INTRAVENOUS | Status: DC | PRN
  Administered 2013-05-03: 40 mg via INTRAVENOUS

## 2013-05-03 MED ORDER — HEMOSTATIC AGENTS (NO CHARGE) OPTIME
TOPICAL | Status: DC | PRN
  Administered 2013-05-03: 1

## 2013-05-03 MED ORDER — BUPIVACAINE-EPINEPHRINE 0.25% -1:200000 IJ SOLN
INTRAMUSCULAR | Status: DC | PRN
  Administered 2013-05-03: 16 mL

## 2013-05-03 MED ORDER — TRAMADOL HCL 50 MG PO TABS: 50.0000 mg | ORAL_TABLET | Freq: Four times a day (QID) | ORAL | Status: DC | PRN

## 2013-05-03 MED ORDER — ONDANSETRON HCL 4 MG/2ML IJ SOLN
4.0000 mg | Freq: Four times a day (QID) | INTRAMUSCULAR | Status: DC | PRN
  Administered 2013-05-03: 4 mg via INTRAVENOUS
  Filled 2013-05-03: qty 2

## 2013-05-03 MED ORDER — LIDOCAINE HCL (CARDIAC) 20 MG/ML IV SOLN
INTRAVENOUS | Status: DC | PRN
  Administered 2013-05-03: 60 mg via INTRAVENOUS

## 2013-05-03 MED ORDER — LACTATED RINGERS IV SOLN
INTRAVENOUS | Status: DC
  Administered 2013-05-03: 12:00:00 via INTRAVENOUS

## 2013-05-03 MED ORDER — ARTIFICIAL TEARS OP OINT
TOPICAL_OINTMENT | OPHTHALMIC | Status: DC | PRN
  Administered 2013-05-03: 1 via OPHTHALMIC

## 2013-05-03 MED ORDER — SODIUM CHLORIDE 0.9 % IR SOLN
Status: DC | PRN
  Administered 2013-05-03: 1

## 2013-05-03 MED ORDER — ONDANSETRON HCL 4 MG/2ML IJ SOLN
INTRAMUSCULAR | Status: AC
  Filled 2013-05-03: qty 2

## 2013-05-03 MED ORDER — ONDANSETRON HCL 4 MG/2ML IJ SOLN
INTRAMUSCULAR | Status: DC | PRN
  Administered 2013-05-03 (×2): 4 mg via INTRAVENOUS

## 2013-05-03 MED ORDER — BUPIVACAINE-EPINEPHRINE PF 0.25-1:200000 % IJ SOLN
INTRAMUSCULAR | Status: AC
  Filled 2013-05-03: qty 30

## 2013-05-03 SURGICAL SUPPLY — 49 items
APL SKNCLS STERI-STRIP NONHPOA (GAUZE/BANDAGES/DRESSINGS) ×1
APPLIER CLIP 5 13 M/L LIGAMAX5 (MISCELLANEOUS) ×2
APR CLP MED LRG 5 ANG JAW (MISCELLANEOUS) ×1
BAG SPEC RTRVL LRG 6X4 10 (ENDOMECHANICALS) ×1
BENZOIN TINCTURE PRP APPL 2/3 (GAUZE/BANDAGES/DRESSINGS) ×2 IMPLANT
CANISTER SUCTION 2500CC (MISCELLANEOUS) ×2 IMPLANT
CHLORAPREP W/TINT 26ML (MISCELLANEOUS) ×2 IMPLANT
CLIP APPLIE 5 13 M/L LIGAMAX5 (MISCELLANEOUS) ×1 IMPLANT
COVER MAYO STAND STRL (DRAPES) ×2 IMPLANT
COVER SURGICAL LIGHT HANDLE (MISCELLANEOUS) ×2 IMPLANT
DECANTER SPIKE VIAL GLASS SM (MISCELLANEOUS) ×2 IMPLANT
DRAPE C-ARM 42X72 X-RAY (DRAPES) ×2 IMPLANT
DRAPE UTILITY 15X26 W/TAPE STR (DRAPE) ×4 IMPLANT
DRSG TEGADERM 2-3/8X2-3/4 SM (GAUZE/BANDAGES/DRESSINGS) ×8 IMPLANT
ELECT REM PT RETURN 9FT ADLT (ELECTROSURGICAL) ×2
ELECTRODE REM PT RTRN 9FT ADLT (ELECTROSURGICAL) ×1 IMPLANT
GAUZE SPONGE 2X2 8PLY STRL LF (GAUZE/BANDAGES/DRESSINGS) ×1 IMPLANT
GLOVE BIO SURGEON STRL SZ7 (GLOVE) ×1 IMPLANT
GLOVE BIOGEL PI IND STRL 6.5 (GLOVE) IMPLANT
GLOVE BIOGEL PI IND STRL 7.0 (GLOVE) IMPLANT
GLOVE BIOGEL PI IND STRL 8 (GLOVE) ×1 IMPLANT
GLOVE BIOGEL PI INDICATOR 6.5 (GLOVE) ×1
GLOVE BIOGEL PI INDICATOR 7.0 (GLOVE) ×1
GLOVE BIOGEL PI INDICATOR 8 (GLOVE) ×1
GLOVE ECLIPSE 8.0 STRL XLNG CF (GLOVE) ×2 IMPLANT
GLOVE EUDERMIC 7 POWDERFREE (GLOVE) ×1 IMPLANT
GLOVE SURG SS PI 6.5 STRL IVOR (GLOVE) ×1 IMPLANT
GOWN STRL NON-REIN LRG LVL3 (GOWN DISPOSABLE) ×7 IMPLANT
GOWN STRL REIN XL XLG (GOWN DISPOSABLE) ×1 IMPLANT
HEMOSTAT SNOW SURGICEL 2X4 (HEMOSTASIS) ×1 IMPLANT
KIT BASIN OR (CUSTOM PROCEDURE TRAY) ×2 IMPLANT
KIT ROOM TURNOVER OR (KITS) ×2 IMPLANT
NS IRRIG 1000ML POUR BTL (IV SOLUTION) ×2 IMPLANT
PAD ARMBOARD 7.5X6 YLW CONV (MISCELLANEOUS) ×2 IMPLANT
POUCH SPECIMEN RETRIEVAL 10MM (ENDOMECHANICALS) ×2 IMPLANT
SCISSORS LAP 5X35 DISP (ENDOMECHANICALS) ×2 IMPLANT
SET CHOLANGIOGRAPH 5 50 .035 (SET/KITS/TRAYS/PACK) ×2 IMPLANT
SET IRRIG TUBING LAPAROSCOPIC (IRRIGATION / IRRIGATOR) ×2 IMPLANT
SLEEVE ENDOPATH XCEL 5M (ENDOMECHANICALS) ×4 IMPLANT
SPECIMEN JAR SMALL (MISCELLANEOUS) ×2 IMPLANT
SPONGE GAUZE 2X2 STER 10/PKG (GAUZE/BANDAGES/DRESSINGS) ×1
STRIP CLOSURE SKIN 1/2X4 (GAUZE/BANDAGES/DRESSINGS) ×1 IMPLANT
SUT MON AB 4-0 PC3 18 (SUTURE) ×2 IMPLANT
TOWEL OR 17X24 6PK STRL BLUE (TOWEL DISPOSABLE) ×2 IMPLANT
TOWEL OR 17X26 10 PK STRL BLUE (TOWEL DISPOSABLE) ×2 IMPLANT
TRAY LAPAROSCOPIC (CUSTOM PROCEDURE TRAY) ×2 IMPLANT
TROCAR XCEL BLUNT TIP 100MML (ENDOMECHANICALS) ×2 IMPLANT
TROCAR XCEL NON-BLD 11X100MML (ENDOMECHANICALS) IMPLANT
TROCAR XCEL NON-BLD 5MMX100MML (ENDOMECHANICALS) ×2 IMPLANT

## 2013-05-03 NOTE — Transfer of Care (Signed)
Immediate Anesthesia Transfer of Care Note  Patient: [name]  Procedure(s) Performed: Procedure(s): LAPAROSCOPIC CHOLECYSTECTOMY WITH INTRAOPERATIVE CHOLANGIOGRAM (N/A)  Patient Location: PACU  Anesthesia Type:General  Level of Consciousness: awake, alert  and oriented  Airway & Oxygen Therapy: Patient Spontanous Breathing and Patient connected to face mask oxygen  Post-op Assessment: Report given to PACU RN and Post -op Vital signs reviewed and stable  Post vital signs: Reviewed and stable  Complications: No apparent anesthesia complications

## 2013-05-03 NOTE — Interval H&P Note (Signed)
History and Physical Interval Note:  05/03/2013 1:40 PM  [name]  has presented today for surgery, with the diagnosis of cholelithiasis  The various methods of treatment have been discussed with the patient and family. After consideration of risks, benefits and other options for treatment, the patient has consented to  Procedure(s): LAPAROSCOPIC CHOLECYSTECTOMY WITH INTRAOPERATIVE CHOLANGIOGRAM (N/A) as a surgical intervention .  The patient's history has been reviewed, patient examined, no change in status, stable for surgery.  I have reviewed the patient's chart and labs.  Questions were answered to the patient's satisfaction.     Nike Southers [facility]la [facility]

## 2013-05-03 NOTE — Anesthesia Postprocedure Evaluation (Signed)
  Anesthesia Post-op Note  Patient: [name]  Procedure(s) Performed: Procedure(s): LAPAROSCOPIC CHOLECYSTECTOMY WITH INTRAOPERATIVE CHOLANGIOGRAM (N/A)  Patient Location: PACU  Anesthesia Type:General  Level of Consciousness: awake  Airway and Oxygen Therapy: Patient Spontanous Breathing  Post-op Pain: mild  Post-op Assessment: Post-op Vital signs reviewed  Post-op Vital Signs: Reviewed  Complications: No apparent anesthesia complications

## 2013-05-03 NOTE — Progress Notes (Signed)
Received report from Maud Deed RN

## 2013-05-03 NOTE — Anesthesia Preprocedure Evaluation (Addendum)
Anesthesia Evaluation  Patient identified by MRN, date of birth, ID band Patient awake    Reviewed: Allergy & Precautions, H&P , NPO status , Patient's Chart, lab work & pertinent test results  Airway Mallampati: II TM Distance: >3 FB Neck ROM: Full    Dental no notable dental hx. (+) Teeth Intact and Dental Advisory Given   Pulmonary asthma ,  breath sounds clear to auscultation  Pulmonary exam normal       Cardiovascular negative cardio ROS  Rhythm:Regular Rate:Normal     Neuro/Psych  Headaches, PSYCHIATRIC DISORDERS Anxiety Depression Bipolar Disorder negative psych ROS   GI/Hepatic Neg liver ROS, GERD-  Medicated and Controlled,  Endo/Other  Hypothyroidism   Renal/GU negative Renal ROS  negative genitourinary   Musculoskeletal   Abdominal   Peds  Hematology negative hematology ROS (+)   Anesthesia Other Findings   Reproductive/Obstetrics negative OB ROS                          Anesthesia Physical Anesthesia Plan  ASA: II  Anesthesia Plan: General   Post-op Pain Management:    Induction: Intravenous  Airway Management Planned: Oral ETT  Additional Equipment:   Intra-op Plan:   Post-operative Plan: Extubation in OR  Informed Consent: I have reviewed the patients History and Physical, chart, labs and discussed the procedure including the risks, benefits and alternatives for the proposed anesthesia with the patient or authorized representative who has indicated his/her understanding and acceptance.   Dental advisory given  Plan Discussed with: CRNA  Anesthesia Plan Comments:         Anesthesia Quick Evaluation

## 2013-05-03 NOTE — H&P (View-Only) (Signed)
Patient ID: [name], female   DOB: 11/14/1950, 62 y.o.   MRN: [id number]  Chief Complaint  Patient presents with  . Establish Care    G.B    HPI [name] is a 62 y.o. female.   HPI  She is referred by Dr. Brodie for further evaluation of epigastric pain and gallstones.  She had a severe episode of epigastric pain that radiated through to her back somewhat in November. She presented to the emergency department and a cardiac etiology was ruled out. She subsequently underwent an abdominal ultrasound which demonstrated numerous small gallstones. No common bile duct diameter. No evidence of acute cholecystitis. This episode occurred after eating chicken Alfredo. She also has a known mild gastroparesis. She's been sent over here to discuss possible laparoscopic cholecystectomy. There is a family history of gallbladder disease.  She states every since completing her chemotherapy for leiomyosarcoma of the vaginal cuff, she's had gastrointestinal problems.  Past Medical History  Diagnosis Date  . Deep phlebothrombosis, postpartum, unspecified as to episode of care   . Bipolar I disorder, most recent episode (or current) depressed, severe, without mention of psychotic behavior   . Other and unspecified hyperlipidemia   . IBS (irritable bowel syndrome)   . GERD (gastroesophageal reflux disease)   . Allergic rhinitis, cause unspecified   . Hyperlipidemia   . Gastritis   . Depression   . Anxiety   . Migraine headache   . Leiomyosarcoma of uterus     vaginal wall  . Asthma   . Blood transfusion without reported diagnosis   . Parathyroid adenoma 2014  . Status post chemotherapy 2014    Gastroparesis  Past Surgical History  Procedure Laterality Date  . Cesarean section  1986  . Vaginal hysterectomy    . Pubovaginal sling    . Neck surgery  2006    C spine fusion  . Laprotomy  04/2012    for cancer    Family History  Problem Relation Age of Onset  . Coronary artery disease  Father   . Heart disease Father   . Hypertension Father   . Irritable bowel syndrome Father   . Diabetes Maternal Grandmother   . Arthritis Mother   . Hypertension Mother   . Migraines Mother   . Heart disease Mother   . Irritable bowel syndrome Sister   . Irritable bowel syndrome Brother   . Colon cancer Neg Hx   . Esophageal cancer Neg Hx   . Rectal cancer Neg Hx   . Stomach cancer Neg Hx       Social History History  Substance Use Topics  . Smoking status: Never Smoker   . Smokeless tobacco: Never Used  . Alcohol Use: No    Allergies  Allergen Reactions  . Codeine     REACTION: Disoriented  . Morphine And Related     Sever headache    Current Outpatient Prescriptions  Medication Sig Dispense Refill  . ALPRAZolam (XANAX) 0.5 MG tablet Take 0.5 mg by mouth at bedtime as needed for sleep.      . cetirizine (ZYRTEC) 10 MG tablet Take 10 mg by mouth at bedtime.      . Cholecalciferol (VITAMIN D-3) 1000 UNITS CAPS Take 2 capsules by mouth once a week.       . citalopram (CELEXA) 20 MG tablet Take 40 mg by mouth daily.      . estradiol (ESTRACE) 1 MG tablet take 1 tablet by mouth once   daily  90 tablet  3  . Lactobacillus Rhamnosus, GG, (CULTURELLE PO) Take 1 capsule by mouth daily.      . levothyroxine (SYNTHROID, LEVOTHROID) 50 MCG tablet Take 100 mcg by mouth daily before breakfast.       . MULTIPLE VITAMIN PO Take by mouth.        . omeprazole (PRILOSEC) 40 MG capsule Take 1 capsule (40 mg total) by mouth daily.  30 capsule  3  . Oxymetazoline HCl (NASAL SPRAY NA) Place into the nose. Nasal Cort spray      . topiramate (TOPAMAX) 100 MG tablet Take 100 mg by mouth at bedtime.       No current facility-administered medications for this visit.    Review of Systems Review of Systems  Respiratory: Positive for cough.   Gastrointestinal: Positive for nausea, abdominal pain and diarrhea.  Neurological: Positive for weakness and headaches.    Blood pressure 124/68,  pulse 60, temperature 98 F (36.7 C), resp. rate 18, height 5' 4" (1.626 m), weight 122 lb (55.339 kg).  Physical Exam Physical Exam  Constitutional:  Thin female.  Appears anxious.  HENT:  Head: Normocephalic and atraumatic.  Eyes: EOM are normal. No scleral icterus.  Neck: Neck supple.  Cardiovascular: Normal rate and regular rhythm.   Pulmonary/Chest: Effort normal and breath sounds normal.  Abdominal: Soft. She exhibits no mass. There is no tenderness.  Lower midline scar.  Musculoskeletal: She exhibits no edema.  Lymphadenopathy:    She has no cervical adenopathy.  Neurological: She is alert.  Skin: Skin is warm and dry.  Psychiatric: Judgment and thought content normal.    Data Reviewed Ultrasound report. Note from Dr. Brodie. Gastric emptying scan report.  Assessment    Recent epigastric pain highly suggestive of biliary colic after eating chicken Alfredo. She has known gallstones. Also has some chronic nausea which could be related to her gastroparesis.     Plan    We discussed a laparoscopic cholecystectomy with cholangiogram. We did discuss the fact that it may relieve some of her symptoms but she may still have some problems because of her gastroparesis. She is interested in proceeding.  I have explained the procedure, risks, and aftercare of cholecystectomy.  Risks include but are not limited to bleeding, infection, wound problems, anesthesia, diarrhea, bile leak, injury to common bile duct/liver/intestine.  She seems to understand and agrees to proceed.         [name] 04/24/2013, 5:33 PM    

## 2013-05-03 NOTE — Preoperative (Signed)
Beta Blockers   Reason not to administer Beta Blockers:Not Applicable 

## 2013-05-03 NOTE — Op Note (Signed)
Preoperative diagnosis:  Symptomatic cholelithiasis  Postoperative diagnosis:  Same  Procedure: Laparoscopic cholecystectomy with cholangiogram.  Surgeon: Avel Peace, M.D.  Asst.:  Cicero Duck, M.D.  Anesthesia: General  Indication:   This is a 62 year old female with an episode of severe epigastric pain and presented to the emergency department. Cardiac workup was negative. Abdominal ultrasound demonstrated numerous small gallstones. She now presents for elective cholecystectomy. Preoperative liver function tests are within normal limits.  Technique: She was brought to the operating room, placed supine on the operating table, and a general anesthetic was administered.  The abdominal wall was then sterilely prepped and draped. Local anesthetic (Marcaine) was infiltrated in the subumbilical region. A small subumbilical incision was made, at the site of a previous scar, through the skin, subcutaneous tissue, fascia, and peritoneum entering the peritoneal cavity under direct vision. A pursestring suture of 0 Vicryl was placed around the edges of the fascia. A Hassan trocar was introduced into the peritoneal cavity and a pneumoperitoneum was created by insufflation of carbon dioxide gas. The laparoscope was introduced into the trocar and no underlying bleeding or organ injury was noted. She was then placed in the reverse Trendelenburg position with the right side tilted slightly up.  Three 5 mm trocars were then placed into the abdominal cavity under laparoscopic vision. One in the epigastric area, and 2 in the right upper quadrant area. The gallbladder was visualized and the fundus was grasped and retracted toward the right shoulder. Adhesions between the omentum and gallbladder were separated bluntly and sharply. The infundibulum was mobilized with dissection close to the gallbladder and retracted laterally. The cystic duct was identified and a window was created around it. The cystic artery was  also identified and a window was created around it. The critical view was achieved. A clip was placed at the neck of the gallbladder. A small incision was made in the cystic duct. A cholangiocatheter was introduced through the anterior abdominal wall and placed in the cystic duct. A intraoperative cholangiogram was then performed.  Under real-time fluoroscopy, dilute contrast was injected into the cystic duct.  The common hepatic duct, the right and left hepatic ducts, and the common duct were all visualized. Contrast drained into the duodenum without obvious evidence of any obstructing ductal lesion. The final report is pending the Radiologist's interpretation.  The cholangiocatheter was removed, the cystic duct was clipped 3 times on the biliary side, and then the cystic duct was divided sharply. No bile leak was noted from the cystic duct stump.  The cystic artery was then clipped and divided. Following this the gallbladder was dissected free from the liver using electrocautery. The gallbladder was then placed in a retrieval bag and removed from the abdominal cavity through the subumbilical incision.  The gallbladder fossa was inspected, irrigated, and bleeding was controlled with electrocautery.  A piece of Surgicel was placed in the gallbladder fossa. Inspection showed that hemostasis was adequate and there was no evidence of bile leak.  The irrigation fluid was evacuated as much as possible.  The subumbilical trocar was removed and the fascial defect was closed by tightening and tying down the pursestring suture under laparoscopic vision.  The remaining trocars were removed and the pneumoperitoneum was released. The skin incisions were closed with 4-0 Monocryl subcuticular stitches. Steri-Strips and sterile dressings were applied.  The procedure was well-tolerated without any apparent complications. She was taken to the recovery room in satisfactory condition.

## 2013-05-08 ENCOUNTER — Encounter (HOSPITAL_COMMUNITY): Payer: Self-pay | Admitting: General Surgery

## 2013-05-08 NOTE — Progress Notes (Signed)
Anesthesia follow-up:  Dr. Abbey Chatters asked that I forward patient's PAT CXR results to her PCP or oncologist.  I called and spoke with [name] today.  She was already aware of CXR report findings.  She reports that her PCP is now Dr. Fredia Beets with [facility].  Her oncologist (GYN oncologist) is Dr. Leonette Most Pippitt.  She requested that the report be sent to his office since he had actually referred her to a pulmonologist is the past, and felt he would be the best to determine if/when further testing is needed and if she should be referred back to pulmonology.  I have called Dr. Barbaraann Barthel office and left a voicemail regarding CXR results with Dr. Barbaraann Barthel nurse Warner Mccreedy.  I also faxed the report to his office [phone]) with confirmation.  Patient reports she is doing well following surgery.  She did ask that I contact [facility] because she had not heard back from them regarding her post-op follow-up visit.  I called [facility] to inquire.  A message has already been sent to the nurse because they were already booked through the end of January and were trying to figure out if they could work her in the schedule.  Patient was told to call back next week if she had not heard back from Dr. Maris Berger office.  Velna Ochs [facility] Short Stay Center/Anesthesiology Phone [phone] 05/08/2013 2:44 PM

## 2013-06-11 ENCOUNTER — Encounter (INDEPENDENT_AMBULATORY_CARE_PROVIDER_SITE_OTHER): Payer: Self-pay | Admitting: General Surgery

## 2013-06-26 ENCOUNTER — Ambulatory Visit (INDEPENDENT_AMBULATORY_CARE_PROVIDER_SITE_OTHER): Payer: BC Managed Care – PPO | Admitting: General Surgery

## 2013-06-26 VITALS — BP 110/80 | HR 68 | Temp 98.2°F | Resp 16 | Ht 64.0 in | Wt 121.4 lb

## 2013-06-26 DIAGNOSIS — Z4889 Encounter for other specified surgical aftercare: Secondary | ICD-10-CM

## 2013-06-26 NOTE — Patient Instructions (Signed)
Try to stay with a lowfat and low gluten diet.

## 2013-06-26 NOTE — Progress Notes (Signed)
She is here for a postop visit following laparoscopic cholecystectomy.   She says her preoperative symptoms are about 50% better since the cholecystectomy and she is happy with the results.  PE:  ABD:  Soft, incisions clean/dry/intact and solid.  Assessment:  Doing well postop.  Plan:  Lowfat and low gluten diet recommended.  Activities as tolerated.  Return visit prn.

## 2013-08-03 ENCOUNTER — Ambulatory Visit: Payer: BC Managed Care – PPO | Admitting: Internal Medicine

## 2013-08-16 ENCOUNTER — Encounter: Payer: Self-pay | Admitting: Internal Medicine

## 2013-08-16 DIAGNOSIS — R9389 Abnormal findings on diagnostic imaging of other specified body structures: Secondary | ICD-10-CM | POA: Insufficient documentation

## 2013-09-10 DIAGNOSIS — C499 Malignant neoplasm of connective and soft tissue, unspecified: Secondary | ICD-10-CM | POA: Insufficient documentation

## 2013-09-10 DIAGNOSIS — R918 Other nonspecific abnormal finding of lung field: Secondary | ICD-10-CM | POA: Insufficient documentation

## 2013-09-10 DIAGNOSIS — R911 Solitary pulmonary nodule: Secondary | ICD-10-CM | POA: Insufficient documentation

## 2013-09-10 HISTORY — DX: Other nonspecific abnormal finding of lung field: R91.8

## 2013-09-10 HISTORY — DX: Malignant neoplasm of connective and soft tissue, unspecified: C49.9

## 2013-09-10 HISTORY — DX: Solitary pulmonary nodule: R91.1

## 2013-10-01 ENCOUNTER — Telehealth: Payer: Self-pay | Admitting: Internal Medicine

## 2013-10-01 NOTE — Telephone Encounter (Signed)
Patient states she is having diarrhea after she eats. Reports this has been occurring for several weeks. The stool is loose but not watery. Also reports lower abdominal pain. She is eating bland foods. Imodium does not help. She also takes a Probiotic daily. She did take antibiotics for UTI several weeks ago. Scheduled with Dr. Olevia Perches tomorrow at 1:30 PM.

## 2013-10-02 ENCOUNTER — Encounter: Payer: Self-pay | Admitting: Internal Medicine

## 2013-10-02 ENCOUNTER — Other Ambulatory Visit (INDEPENDENT_AMBULATORY_CARE_PROVIDER_SITE_OTHER): Payer: BC Managed Care – PPO

## 2013-10-02 ENCOUNTER — Ambulatory Visit (INDEPENDENT_AMBULATORY_CARE_PROVIDER_SITE_OTHER): Payer: BC Managed Care – PPO | Admitting: Internal Medicine

## 2013-10-02 VITALS — BP 118/80 | HR 70 | Ht 64.0 in | Wt 122.8 lb

## 2013-10-02 DIAGNOSIS — R197 Diarrhea, unspecified: Secondary | ICD-10-CM

## 2013-10-02 LAB — TSH: TSH: 0.99 u[IU]/mL (ref 0.35–4.50)

## 2013-10-02 LAB — HEPATIC FUNCTION PANEL
ALT: 15 U/L (ref 0–35)
AST: 19 U/L (ref 0–37)
Albumin: 3.8 g/dL (ref 3.5–5.2)
Alkaline Phosphatase: 107 U/L (ref 39–117)
Bilirubin, Direct: 0 mg/dL (ref 0.0–0.3)
Total Bilirubin: 0.4 mg/dL (ref 0.2–1.2)
Total Protein: 6.8 g/dL (ref 6.0–8.3)

## 2013-10-02 LAB — SEDIMENTATION RATE: Sed Rate: 7 mm/hr (ref 0–22)

## 2013-10-02 MED ORDER — DICYCLOMINE HCL 20 MG PO TABS: 20.0000 mg | ORAL_TABLET | Freq: Two times a day (BID) | ORAL | Status: DC

## 2013-10-02 MED ORDER — METRONIDAZOLE 250 MG PO TABS: 250.0000 mg | ORAL_TABLET | Freq: Three times a day (TID) | ORAL | Status: DC

## 2013-10-02 MED ORDER — COLESTIPOL HCL 1 G PO TABS: 2.0000 g | ORAL_TABLET | ORAL | Status: DC

## 2013-10-02 NOTE — Patient Instructions (Signed)
Your physician has requested that you go to the basement for the following lab work before leaving today: Hepatic function, Sed Rate, TSH, Celiac 10 panel  We have sent the following medications to your pharmacy for you to pick up at your convenience: Flagyl 250 mg three times daily x 10 days Bentyl 20 mg twice daily Colestid 1 gram-2 tablets every morning  You have been scheduled for a small bowel follow thru at [facility]. Your appointment is on Tuesday, 10/10/13 at 9:30 am. Please arrive 15 minutes prior to your test for registration. Make certain not to have anything to eat or drink starting Midnight on the night before your test. If for some reason you need to reschedule your test, please call radiology at [phone]. ____________________________________________________________________ The Small Bowel Follow Thru examination is used to visualize the entire small bowel (intestines); specifically the connection between the small and large intestine. You will be positioned on a flat x-ray table and an image of your abdomen taken. Then the technologist will show the x-ray to the radiologist. The radiologist will instruct your technologist how much (1-2 cups) barium sulfate you will drink and when to begin taking the timed x-rays, usually 15-30 minutes after you begin drinking. Barium is a harmless substance that will highlight your small intestine by absorbing x-ray. The taste is chalky and it feels very heavy both in the cup and in your stomach.  After the first x-ray is taken and shown to the radiologist, he/she will determine when the next image is to be taken. This is repeated until the barium has reached the end of the small intestine and enters the beginning of the colon (cecum). At such time when the barium spills into the colon, you will be positioned on the x-ray table once again. The radiologist will use a fluoroscopic camera to take some detailed pictures of the connection between  your small intestine and colon. The fluoroscope is an x-ray unit that works with a television/computer screen. The radiologist will apply pressure to your abdomen with his/her hand and a lead glove, a plastic paddle, or a paddle with an inflated rubber balloon on the end. This is to spread apart your loops of intestine so he/she can see all areas.   This test typically takes around 1 hour to complete.  **Important** Drink plenty of water (8-10 cups/day) for a few days following the procedure to avoid constipation and blockage. The barium will make your stools white for a few days. ____________________________________________________________________ CC:Dr Jesse Fall

## 2013-10-02 NOTE — Progress Notes (Signed)
[name] 07/25/1950 [id number]  Note: This dictation was prepared with Dragon digital system. Any transcriptional errors that result from this procedure are unintentional.   History of Present Illness:  This is a 63 year old white female c/o frequent loose stools of about 2 months duration. She has a history of leiomyosarcoma of the uterus/vaginal cuff which was resected in December 2013. She had chemotherapy  completed in May 2014. She has a history of irritable bowel syndrome. A colonoscopy in September 2014 showed internal hemorrhoids and mild diverticulosis. She had a laparoscopic cholecystectomy in December 2014 and following surgery had a brief episode of diarrhea which subsequently subsided. The current diarrhea started about 2 months later. There is no blood in the stool but she has. crampy abdominal pain in the mid abdomen especially postprandially which causes her to have a loose bowel movement. She has been on probiotics. She took Cipro for a urinary tract infection about 2 months ago.    Past Medical History  Diagnosis Date  . Deep phlebothrombosis, postpartum, unspecified as to episode of care   . Bipolar I disorder, most recent episode (or current) depressed, severe, without mention of psychotic behavior   . Other and unspecified hyperlipidemia   . IBS (irritable bowel syndrome)   . GERD (gastroesophageal reflux disease)   . Allergic rhinitis, cause unspecified   . Hyperlipidemia   . Gastritis   . Depression   . Anxiety   . Migraine headache   . Leiomyosarcoma of uterus     vaginal wall  . Blood transfusion without reported diagnosis   . Parathyroid adenoma 2014  . Status post chemotherapy 2014  . Asthma     chronic cough  . Fibromyalgia     Past Surgical History  Procedure Laterality Date  . Cesarean section  1986  . Vaginal hysterectomy    . Pubovaginal sling    . Neck surgery  2006    C spine fusion  . Laprotomy  04/2012    for cancer  . Colonoscopy     . Cholecystectomy N/A 05/03/2013    Procedure: LAPAROSCOPIC CHOLECYSTECTOMY WITH INTRAOPERATIVE CHOLANGIOGRAM;  Surgeon: Odis Hollingshead, MD;  Location: [place];  Service: General;  Laterality: N/A;    Allergies  Allergen Reactions  . Codeine     REACTION: Disoriented  . Morphine And Related     Severe headache    Family history and social history have been reviewed.  Review of Systems:  Have abdominal pain. The diarrhea. Denies reflux or heartburn  The remainder of the 10 point ROS is negative except as outlined in the H&P  Physical Exam: General Appearance Well developed, in no distress Eyes  Non icteric  HEENT  Non traumatic, normocephalic  Mouth No lesion, tongue papillated, no cheilosis Neck Supple without adenopathy, thyroid not enlarged, no carotid bruits, no JVD Lungs Clear to auscultation bilaterally COR Normal S1, normal S2, regular rhythm, no murmur, quiet precordium Abdomen : high pithch bowel sounds. No distention or tympany. Diffuse tenderness in the periumbilical area. Liver edge at costal margin. No ascites Rectal soft Hemoccult negative stool Extremities  No pedal edema Skin No lesions Neurological Alert and oriented x 3 Psychological Normal mood and affect  Assessment and Plan:   Problem #1 Post laparoscopic cholecystectomy 5 months ago, now with diarrhea. She has a history of irritable bowel syndrome and hyperthyroidism. We will be checking her TSH level, sedimentation rate and sprue profile today. We will also obtain a small bowel follow-through to  assess transit time and to r/o  Crohn's disease. She may have bacterial overgrowth or antibiotic-related diarrhea secondary to Cipro. For that reason, we will start  Flagyl 250 mg 3 times a dayx10 days. For crampy abdominal pain, she will use Bentyl 20 mg twice a day. For suspected choleretic diarrhea, she will take Colestid 1 g 2 tablets every morning.    Lafayette Dragon 10/02/2013

## 2013-10-03 LAB — CELIAC PANEL 10
Endomysial Screen: NEGATIVE
Gliadin IgA: 11.7 U/mL (ref <20)
Gliadin IgG: 7.4 U/mL (ref <20)
IgA: 159 mg/dL (ref 69–380)
Tissue Transglut Ab: 10.9 U/mL (ref <20)
Tissue Transglutaminase Ab, IgA: 3.2 U/mL (ref <20)

## 2013-10-09 ENCOUNTER — Ambulatory Visit: Payer: BC Managed Care – PPO | Admitting: Internal Medicine

## 2013-10-09 ENCOUNTER — Telehealth: Payer: Self-pay | Admitting: Internal Medicine

## 2013-10-09 NOTE — Telephone Encounter (Signed)
I would wait to see the results of SBFT. Continue Bentyl/Flagyl till gone. Stop Colestid.

## 2013-10-09 NOTE — Telephone Encounter (Signed)
Patient states that she took Bentyl and Flagyl and was starting to feel better. However, she took 2 tablets of colestid a few days later and began to have nausea and more diarrhea. She has since stopped the colestid and is continuing bentyl and flagyl. However, she c/p 2-3 loose stools daily although she is not as crampy. I advised for her to hold colestid for now since she feels that the Bentyl and Flagyl were helping her symptoms and I advised to go ahead with SBFT tomorrow as planned. Dr Olevia Perches, any additional recommendations?

## 2013-10-09 NOTE — Telephone Encounter (Signed)
Left message for patient to call back  

## 2013-10-10 ENCOUNTER — Ambulatory Visit (HOSPITAL_COMMUNITY)
Admission: RE | Admit: 2013-10-10 | Discharge: 2013-10-10 | Disposition: A | Discharge status: Home / Self Care | Payer: BC Managed Care – PPO | Source: Ambulatory Visit | Attending: Internal Medicine | Admitting: Internal Medicine

## 2013-10-10 DIAGNOSIS — R197 Diarrhea, unspecified: Secondary | ICD-10-CM

## 2013-10-10 NOTE — Telephone Encounter (Signed)
Patient advised of Dr Nichola Sizer recommendations. She verbalizes understanding.

## 2013-11-05 ENCOUNTER — Telehealth: Payer: Self-pay | Admitting: Internal Medicine

## 2013-11-05 NOTE — Telephone Encounter (Signed)
Spoke with patient and she is having nausea and diarrhea again for the last 3 days. Denies vomiting, fever and bleeding. She is having abdominal cramping. She is taking Bentyl BID, Zofran and Imodium without relief. Patient is asking for OV. Scheduled on 11/06/13 at 8:45 AM.

## 2013-11-06 ENCOUNTER — Ambulatory Visit (INDEPENDENT_AMBULATORY_CARE_PROVIDER_SITE_OTHER): Payer: BC Managed Care – PPO | Admitting: Internal Medicine

## 2013-11-06 ENCOUNTER — Encounter: Payer: Self-pay | Admitting: Internal Medicine

## 2013-11-06 VITALS — BP 100/60 | HR 76 | Ht 62.75 in | Wt 118.1 lb

## 2013-11-06 DIAGNOSIS — R197 Diarrhea, unspecified: Secondary | ICD-10-CM

## 2013-11-06 DIAGNOSIS — R110 Nausea: Secondary | ICD-10-CM

## 2013-11-06 MED ORDER — PB-HYOSCY-ATROPINE-SCOPOLAMINE 16.2 MG PO TABS: 1.0000 | ORAL_TABLET | Freq: Two times a day (BID) | ORAL | Status: DC

## 2013-11-06 MED ORDER — DIPHENOXYLATE-ATROPINE 2.5-0.025 MG PO TABS: 1.0000 | ORAL_TABLET | Freq: Two times a day (BID) | ORAL | Status: DC

## 2013-11-06 MED ORDER — ONDANSETRON HCL 4 MG PO TABS: 4.0000 mg | ORAL_TABLET | Freq: Every day | ORAL | Status: DC

## 2013-11-06 NOTE — Progress Notes (Signed)
[name] 02-21-1951 [id number]  Note: This dictation was prepared with Dragon digital system. Any transcriptional errors that result from this procedure are unintentional.   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 She is status post chemotherapy  completed in May 2014. We have been seeing her for diarrhea, nausea and gastroesophageal reflux. During her treatments, the patient lost about 30 pounds from 152 pounds to a current 118 pounds. She is complaining of decreased appetite and morning nausea. There has been no vomiting. The diarrhea which we evaluated her on last visit in May 2015 is somewhat better after taking Flagyl, Colestid and Bentyl 20 mg twice a day. She is now down to 3-4 loose bowel movements a day. An upper GI with small bowel follow-through was normal. A colonoscopy in September 2014 with biopsies from the rectum was normal. She underwent a laparoscopic cholecystectomy in December 2014. Her sprue profile is negative.    Past Medical History  Diagnosis Date  . Deep phlebothrombosis, postpartum, unspecified as to episode of care   . Bipolar I disorder, most recent episode (or current) depressed, severe, without mention of psychotic behavior   . Other and unspecified hyperlipidemia   . IBS (irritable bowel syndrome)   . GERD (gastroesophageal reflux disease)   . Allergic rhinitis, cause unspecified   . Hyperlipidemia   . Gastritis   . Depression   . Anxiety   . Migraine headache   . Leiomyosarcoma of uterus     vaginal wall  . Blood transfusion without reported diagnosis   . Parathyroid adenoma 2014  . Status post chemotherapy 2014  . Asthma     chronic cough  . Fibromyalgia     Past Surgical History  Procedure Laterality Date  . Cesarean section  1986  . Vaginal hysterectomy    . Pubovaginal sling    . Neck surgery  2006    C spine fusion   . Laprotomy  04/2012    for cancer  . Colonoscopy    . Cholecystectomy N/A 05/03/2013    Procedure: LAPAROSCOPIC CHOLECYSTECTOMY WITH INTRAOPERATIVE CHOLANGIOGRAM;  Surgeon: Odis Hollingshead, MD;  Location: [place];  Service: General;  Laterality: N/A;    Allergies  Allergen Reactions  . Codeine     REACTION: Disoriented  . Morphine And Related     Severe headache    Family history and social history have been reviewed.  Review of Systems: Positive for nausea. Negative for reflux or dysphagia  The remainder of the 10 point ROS is negative except as outlined in the H&P  Physical Exam: General Appearance in the, in no distress Eyes  Non icteric  HEENT  Non traumatic, normocephalic  Mouth No lesion, tongue papillated, no cheilosis Neck Supple without adenopathy, thyroid not enlarged, no carotid bruits, no JVD Lungs Clear to auscultation bilaterally COR Normal S1, normal S2, regular rhythm, no murmur, quiet precordium Abdomen aspirated soft with minimal discomfort throughout left and right quadrants. No hyperactive bowel sounds. No palpable mass or distention Rectal not done Extremities  No pedal edema Skin No lesions Neurological Alert and oriented x 3 Psychological Normal mood and affect  Assessment and Plan:   Problem #34 63 year old white female with a variety of gastrointestinal problems following chemotherapy and resection of a leiomyosarcoma in December 2013. Some of her symptoms are due to irritable bowel syndrome. She also likely has  bile salt overflow she tooke Colestid  But saw no effect .Marland Kitchen She was treated for bacterial overgrowth with Flagyl. She has responded to antispasmodics. She would like to switch to Donnatal one tablet twice a day in place of Bentyl. We will also give her Zofran 4 mg at bedtime to treat the morning nausea. She will continue omeprazole 20 mg at bedtime and we will add Lomotil one by mouth twice a day. I will see her in 3 months. I have also asked  her to cut back on her Topamax 100 mg to 50 mg at bedtime instead.    [name] 11/06/2013

## 2013-11-06 NOTE — Patient Instructions (Signed)
We have sent the following medications to your pharmacy for you to pick up at your convenience: Lomotil twice daily Donnatal 1 tablet twice daily (in place of bentyl) Zofran 4 mg every night  Please decrease your Topamax to 50 mg daily.  Please follow up with Dr Olevia Perches in 3 months.  CC:Dr Al Lenna Gilford

## 2013-11-08 ENCOUNTER — Telehealth: Payer: Self-pay | Admitting: Internal Medicine

## 2013-11-08 NOTE — Telephone Encounter (Signed)
Patient states she would like a note to return to work on Monday without restrictions. Please, advise. Fax to [employer] [place] [phone].

## 2013-11-09 ENCOUNTER — Encounter: Payer: Self-pay | Admitting: *Deleted

## 2013-11-09 NOTE — Telephone Encounter (Signed)
Spoke with patient and faxed letter and mailed the original to her.

## 2013-11-09 NOTE — Telephone Encounter (Signed)
Please provide a note that she can return to work without restrictions starting Monday June 29,2015

## 2013-12-05 DIAGNOSIS — F09 Unspecified mental disorder due to known physiological condition: Secondary | ICD-10-CM | POA: Insufficient documentation

## 2013-12-05 DIAGNOSIS — Z780 Asymptomatic menopausal state: Secondary | ICD-10-CM

## 2013-12-05 DIAGNOSIS — R110 Nausea: Secondary | ICD-10-CM | POA: Insufficient documentation

## 2013-12-05 DIAGNOSIS — M542 Cervicalgia: Secondary | ICD-10-CM | POA: Insufficient documentation

## 2013-12-05 DIAGNOSIS — M797 Fibromyalgia: Secondary | ICD-10-CM | POA: Insufficient documentation

## 2013-12-05 DIAGNOSIS — K589 Irritable bowel syndrome without diarrhea: Secondary | ICD-10-CM | POA: Insufficient documentation

## 2013-12-05 DIAGNOSIS — R51 Headache: Secondary | ICD-10-CM

## 2013-12-05 DIAGNOSIS — E039 Hypothyroidism, unspecified: Secondary | ICD-10-CM

## 2013-12-05 DIAGNOSIS — R519 Headache, unspecified: Secondary | ICD-10-CM | POA: Insufficient documentation

## 2013-12-05 HISTORY — DX: Cervicalgia: M54.2

## 2013-12-05 HISTORY — DX: Unspecified mental disorder due to known physiological condition: F09

## 2013-12-05 HISTORY — DX: Nausea: R11.0

## 2013-12-05 HISTORY — DX: Headache, unspecified: R51.9

## 2013-12-05 HISTORY — DX: Asymptomatic menopausal state: Z78.0

## 2013-12-05 HISTORY — DX: Hypothyroidism, unspecified: E03.9

## 2013-12-06 ENCOUNTER — Telehealth: Payer: Self-pay | Admitting: Internal Medicine

## 2013-12-06 ENCOUNTER — Encounter (HOSPITAL_COMMUNITY): Payer: Self-pay | Admitting: Emergency Medicine

## 2013-12-06 ENCOUNTER — Emergency Department (HOSPITAL_COMMUNITY)
Admission: EM | Admit: 2013-12-06 | Discharge: 2013-12-06 | Disposition: A | Discharge status: Home / Self Care | Payer: BC Managed Care – PPO | Attending: Emergency Medicine | Admitting: Emergency Medicine

## 2013-12-06 ENCOUNTER — Emergency Department (HOSPITAL_COMMUNITY): Payer: BC Managed Care – PPO

## 2013-12-06 DIAGNOSIS — Z8589 Personal history of malignant neoplasm of other organs and systems: Secondary | ICD-10-CM | POA: Insufficient documentation

## 2013-12-06 DIAGNOSIS — K219 Gastro-esophageal reflux disease without esophagitis: Secondary | ICD-10-CM | POA: Insufficient documentation

## 2013-12-06 DIAGNOSIS — Z86718 Personal history of other venous thrombosis and embolism: Secondary | ICD-10-CM | POA: Insufficient documentation

## 2013-12-06 DIAGNOSIS — F319 Bipolar disorder, unspecified: Secondary | ICD-10-CM | POA: Insufficient documentation

## 2013-12-06 DIAGNOSIS — J45909 Unspecified asthma, uncomplicated: Secondary | ICD-10-CM | POA: Insufficient documentation

## 2013-12-06 DIAGNOSIS — K5900 Constipation, unspecified: Secondary | ICD-10-CM | POA: Insufficient documentation

## 2013-12-06 DIAGNOSIS — K5641 Fecal impaction: Secondary | ICD-10-CM | POA: Insufficient documentation

## 2013-12-06 DIAGNOSIS — F411 Generalized anxiety disorder: Secondary | ICD-10-CM | POA: Insufficient documentation

## 2013-12-06 DIAGNOSIS — R112 Nausea with vomiting, unspecified: Secondary | ICD-10-CM | POA: Insufficient documentation

## 2013-12-06 DIAGNOSIS — R109 Unspecified abdominal pain: Secondary | ICD-10-CM | POA: Insufficient documentation

## 2013-12-06 DIAGNOSIS — Z79899 Other long term (current) drug therapy: Secondary | ICD-10-CM | POA: Insufficient documentation

## 2013-12-06 LAB — I-STAT CG4 LACTIC ACID, ED: Lactic Acid, Venous: 1.05 mmol/L (ref 0.5–2.2)

## 2013-12-06 LAB — CBC WITH DIFFERENTIAL/PLATELET
Basophils Absolute: 0.1 10*3/uL (ref 0.0–0.1)
Basophils Relative: 0 % (ref 0–1)
Eosinophils Absolute: 0.1 10*3/uL (ref 0.0–0.7)
Eosinophils Relative: 0 % (ref 0–5)
HCT: 43.6 % (ref 36.0–46.0)
Hemoglobin: 14.7 g/dL (ref 12.0–15.0)
Lymphocytes Relative: 6 % — ABNORMAL LOW (ref 12–46)
Lymphs Abs: 1.1 10*3/uL (ref 0.7–4.0)
MCH: 30.5 pg (ref 26.0–34.0)
MCHC: 33.7 g/dL (ref 30.0–36.0)
MCV: 90.5 fL (ref 78.0–100.0)
Monocytes Absolute: 0.6 10*3/uL (ref 0.1–1.0)
Monocytes Relative: 3 % (ref 3–12)
Neutro Abs: 16.4 10*3/uL — ABNORMAL HIGH (ref 1.7–7.7)
Neutrophils Relative %: 91 % — ABNORMAL HIGH (ref 43–77)
Platelets: 347 10*3/uL (ref 150–400)
RBC: 4.82 MIL/uL (ref 3.87–5.11)
RDW: 13 % (ref 11.5–15.5)
WBC: 18.2 10*3/uL — ABNORMAL HIGH (ref 4.0–10.5)

## 2013-12-06 LAB — URINALYSIS, ROUTINE W REFLEX MICROSCOPIC
Glucose, UA: NEGATIVE mg/dL
Hgb urine dipstick: NEGATIVE
Ketones, ur: 15 mg/dL — AB
Leukocytes, UA: NEGATIVE
Nitrite: NEGATIVE
Protein, ur: NEGATIVE mg/dL
Specific Gravity, Urine: 1.025 (ref 1.005–1.030)
Urobilinogen, UA: 0.2 mg/dL (ref 0.0–1.0)
pH: 5.5 (ref 5.0–8.0)

## 2013-12-06 LAB — COMPREHENSIVE METABOLIC PANEL
ALT: 17 U/L (ref 0–35)
AST: 21 U/L (ref 0–37)
Albumin: 4.2 g/dL (ref 3.5–5.2)
Alkaline Phosphatase: 141 U/L — ABNORMAL HIGH (ref 39–117)
Anion gap: 12 (ref 5–15)
BUN: 18 mg/dL (ref 6–23)
CO2: 25 mEq/L (ref 19–32)
Calcium: 11.3 mg/dL — ABNORMAL HIGH (ref 8.4–10.5)
Chloride: 104 mEq/L (ref 96–112)
Creatinine, Ser: 1 mg/dL (ref 0.50–1.10)
GFR calc Af Amer: 68 mL/min — ABNORMAL LOW (ref >90)
GFR calc non Af Amer: 59 mL/min — ABNORMAL LOW (ref >90)
Glucose, Bld: 122 mg/dL — ABNORMAL HIGH (ref 70–99)
Potassium: 4.3 mEq/L (ref 3.7–5.3)
Sodium: 141 mEq/L (ref 137–147)
Total Bilirubin: 0.4 mg/dL (ref 0.3–1.2)
Total Protein: 7.9 g/dL (ref 6.0–8.3)

## 2013-12-06 MED ORDER — IOHEXOL 300 MG/ML  SOLN
100.0000 mL | Freq: Once | INTRAMUSCULAR | Status: AC | PRN
  Administered 2013-12-06: 100 mL via INTRAVENOUS

## 2013-12-06 MED ORDER — ONDANSETRON HCL 4 MG/2ML IJ SOLN
4.0000 mg | Freq: Once | INTRAMUSCULAR | Status: AC
  Administered 2013-12-06: 4 mg via INTRAVENOUS
  Filled 2013-12-06: qty 2

## 2013-12-06 MED ORDER — PROMETHAZINE HCL 25 MG/ML IJ SOLN
12.5000 mg | Freq: Once | INTRAMUSCULAR | Status: AC
  Administered 2013-12-06: 12.5 mg via INTRAVENOUS
  Filled 2013-12-06: qty 1

## 2013-12-06 MED ORDER — FLEET ENEMA 7-19 GM/118ML RE ENEM
1.0000 | ENEMA | Freq: Once | RECTAL | Status: AC
  Administered 2013-12-06: 1 via RECTAL
  Filled 2013-12-06: qty 1

## 2013-12-06 MED ORDER — FENTANYL CITRATE 0.05 MG/ML IJ SOLN
100.0000 ug | Freq: Once | INTRAMUSCULAR | Status: AC
  Administered 2013-12-06: 100 ug via INTRAVENOUS
  Filled 2013-12-06: qty 2

## 2013-12-06 MED ORDER — FENTANYL CITRATE 0.05 MG/ML IJ SOLN
50.0000 ug | Freq: Once | INTRAMUSCULAR | Status: AC
  Administered 2013-12-06: 50 ug via INTRAVENOUS
  Filled 2013-12-06: qty 2

## 2013-12-06 MED ORDER — SODIUM CHLORIDE 0.9 % IV BOLUS (SEPSIS)
1000.0000 mL | Freq: Once | INTRAVENOUS | Status: AC
  Administered 2013-12-06: 1000 mL via INTRAVENOUS

## 2013-12-06 NOTE — ED Notes (Signed)
Patient transported to CT 

## 2013-12-06 NOTE — ED Provider Notes (Signed)
CSN: [id number]     Arrival date & time 12/06/13  1708 History   First MD Initiated Contact with Patient 12/06/13 1921     Chief Complaint  Patient presents with  . Abdominal Pain  . Emesis     (Consider location/radiation/quality/duration/timing/severity/associated sxs/prior Treatment) HPI  This is a 63 year old female with history of IBS followed by Dr. Olevia Perches who presents with vomiting. Patient reports that at baseline she normally has diarrhea and has required Imodium and Bentyl in the past. Patient states that she stop his regimen several weeks ago because she noted that her diarrhea was getting better. However, over the last 3 days patient states that she's not had a bowel movement. Today she has developed crampy lower abdominal pain and nonbilious, nonbloody emesis. She states that the pain comes and goes and nothing seems to make it better or worse. Currently her pain is 5/10. She tried Dulcolax and milk of magnesia without relief. She denies any fevers or chills. She denies any other symptoms at this time.  Past Medical History  Diagnosis Date  . Deep phlebothrombosis, postpartum, unspecified as to episode of care   . Bipolar I disorder, most recent episode (or current) depressed, severe, without mention of psychotic behavior   . Other and unspecified hyperlipidemia   . IBS (irritable bowel syndrome)   . GERD (gastroesophageal reflux disease)   . Allergic rhinitis, cause unspecified   . Hyperlipidemia   . Gastritis   . Depression   . Anxiety   . Migraine headache   . Leiomyosarcoma of uterus     vaginal wall  . Blood transfusion without reported diagnosis   . Parathyroid adenoma 2014  . Status post chemotherapy 2014  . Asthma     chronic cough  . Fibromyalgia    Past Surgical History  Procedure Laterality Date  . Cesarean section  1986  . Vaginal hysterectomy    . Pubovaginal sling    . Neck surgery  2006    C spine fusion  . Laprotomy  04/2012    for cancer  .  Colonoscopy    . Cholecystectomy N/A 05/03/2013    Procedure: LAPAROSCOPIC CHOLECYSTECTOMY WITH INTRAOPERATIVE CHOLANGIOGRAM;  Surgeon: Odis Hollingshead, MD;  Location: [place];  Service: General;  Laterality: N/A;   Family History  Problem Relation Age of Onset  . Coronary artery disease Father   . Heart disease Father   . Hypertension Father   . Irritable bowel syndrome Father   . Diabetes Maternal Grandmother   . Arthritis Mother   . Hypertension Mother   . Migraines Mother   . Heart disease Mother   . Irritable bowel syndrome Sister   . Irritable bowel syndrome Brother   . Colon cancer Neg Hx   . Esophageal cancer Neg Hx   . Rectal cancer Neg Hx   . Stomach cancer Neg Hx    History  Substance Use Topics  . Smoking status: Never Smoker   . Smokeless tobacco: Never Used  . Alcohol Use: 1.0 - 1.5 oz/week    2-3 drink(s) per week     Comment: rarely   OB History   Grav Para Term Preterm Abortions TAB SAB Ect Mult Living                 Review of Systems  Constitutional: Negative for fever.  Respiratory: Negative for cough, chest tightness and shortness of breath.   Cardiovascular: Negative for chest pain.  Gastrointestinal: Positive for nausea, vomiting,  abdominal pain and constipation. Negative for diarrhea and blood in stool.  Genitourinary: Negative for dysuria.  Musculoskeletal: Negative for back pain.  Neurological: Negative for headaches.  Psychiatric/Behavioral: Negative for confusion.  All other systems reviewed and are negative.     Allergies  Codeine and Morphine and related  Home Medications   Prior to Admission medications   Medication Sig Start Date End Date Taking? Authorizing Provider  ALPRAZolam Duanne Moron) 0.5 MG tablet Take 0.5 mg by mouth at bedtime as needed for sleep.   Yes Historical Provider, MD  buPROPion (WELLBUTRIN XL) 150 MG 24 hr tablet Take 150 mg by mouth daily.  11/01/13  Yes Historical Provider, MD  cetirizine (ZYRTEC) 10 MG tablet  Take 10 mg by mouth at bedtime.   Yes Historical Provider, MD  estradiol (ESTRACE) 1 MG tablet take 1 tablet by mouth once daily 01/05/12  Yes Neena Rhymes, MD  levothyroxine (SYNTHROID, LEVOTHROID) 75 MCG tablet Take 1 tablet (75 mcg total) by mouth daily before breakfast. 04/25/13  Yes Renato Shin, MD  Multiple Vitamins-Minerals (MULTIVITAMIN WITH MINERALS) tablet Take 1 tablet by mouth daily.   Yes Historical Provider, MD  omeprazole (PRILOSEC) 40 MG capsule Take 1 capsule (40 mg total) by mouth daily. 12/05/12  Yes Lafayette Dragon, MD  ondansetron (ZOFRAN) 4 MG tablet Take 1 tablet (4 mg total) by mouth at bedtime. 11/06/13  Yes Lafayette Dragon, MD  SUMAtriptan (IMITREX) 6 MG/0.5ML SOLN injection Inject 6 mg into the skin every 2 (two) hours as needed for migraine or headache. May repeat in 2 hours if headache persists or recurs.   Yes Historical Provider, MD  topiramate (TOPAMAX) 100 MG tablet Take 100 mg by mouth at bedtime.   Yes Historical Provider, MD  traMADol (ULTRAM) 50 MG tablet Take 50-100 mg by mouth every 6 (six) hours as needed (pain). 05/03/13  Yes Odis Hollingshead, MD  Vitamin D, Ergocalciferol, (DRISDOL) 50000 UNITS CAPS capsule Take 50,000 Units by mouth every 7 (seven) days. Wednesday   Yes Historical Provider, MD  belladona alk-PHENObarbital (DONNATAL) 16.2 MG tablet Take 1 tablet by mouth 2 (two) times daily. 11/06/13 12/06/13  Lafayette Dragon, MD   BP 113/72  Pulse 72  Temp(Src) 98.1 F (36.7 C) (Oral)  Resp 18  SpO2 99% Physical Exam  Nursing note and vitals reviewed. Constitutional: She is oriented to person, place, and time. No distress.  Ill-appearing but nontoxic  HENT:  Head: Normocephalic and atraumatic.  Mouth/Throat: Oropharynx is clear and moist.  Eyes: Pupils are equal, round, and reactive to light.  Neck: Neck supple.  Cardiovascular: Normal rate, regular rhythm and normal heart sounds.   No murmur heard. Pulmonary/Chest: Effort normal and breath sounds  normal. No respiratory distress. She has no wheezes.  Abdominal: Soft. She exhibits no distension. There is tenderness. There is no rebound and no guarding.  Hypoactive bowel sounds  Genitourinary:  Normal rectal tone, heart stool noted in the rectal vault  Musculoskeletal: She exhibits no edema.  Neurological: She is alert and oriented to person, place, and time.  Skin: Skin is warm and dry.  Psychiatric: She has a normal mood and affect.    ED Course  Procedures (including critical care time) Labs Review Labs Reviewed  CBC WITH DIFFERENTIAL - Abnormal; Notable for the following:    WBC 18.2 (*)    Neutrophils Relative % 91 (*)    Neutro Abs 16.4 (*)    Lymphocytes Relative 6 (*)  All other components within normal limits  COMPREHENSIVE METABOLIC PANEL - Abnormal; Notable for the following:    Glucose, Bld 122 (*)    Calcium 11.3 (*)    Alkaline Phosphatase 141 (*)    GFR calc non Af Amer 59 (*)    GFR calc Af Amer 68 (*)    All other components within normal limits  URINALYSIS, ROUTINE W REFLEX MICROSCOPIC - Abnormal; Notable for the following:    Color, Urine AMBER (*)    APPearance CLOUDY (*)    Bilirubin Urine SMALL (*)    Ketones, ur 15 (*)    All other components within normal limits  I-STAT CG4 LACTIC ACID, ED    Imaging Review Ct Abdomen Pelvis W Contrast  12/06/2013   CLINICAL DATA:  Constipation with last bowel movement 3 days ago. Nausea and vomiting.  EXAM: CT ABDOMEN AND PELVIS WITH CONTRAST  TECHNIQUE: Multidetector CT imaging of the abdomen and pelvis was performed using the standard protocol following bolus administration of intravenous contrast.  CONTRAST:  141m OMNIPAQUE IOHEXOL 300 MG/ML  SOLN  COMPARISON:  Small bowel series on 10/10/2013, abdominal ultrasound 04/03/2013 and pelvic CT 02/25/2012.  FINDINGS: Lung bases: Minimal dependent atelectasis at both lung bases. No significant pleural or pericardial effusion.  Liver/Biliary/Pancreas: Tiny  low-density lesion is noted in the dome of the right hepatic lobe on image 6. No other focal lesions are identified. There is mild intra and extrahepatic biliary dilatation status post cholecystectomy. The pancreas appears normal.  Spleen/Adrenal glands: Unremarkable.  Kidneys/Ureters/Bladder: There is no evidence of renal mass, hydronephrosis or ureteral calculus. There is an extra renal pelvis on the right without delay in contrast excretion. The bladder appears normal.  Bowel/Peritoneum: The stomach, small bowel and appendix appear normal. The colon is fluid-filled and distended from the cecum to the sigmoid colon. There is a large amount of stool within the sigmoid colon and rectum. No focal mass, wall thickening or surrounding inflammatory change identified. No ascites or peritoneal nodularity.  Retroperitoneum/Pelvis: There are no enlarged abdominal or pelvic lymph nodes. No significant vascular findings are present. The uterus is surgically absent. There is no adnexal mass.  Abdominal wall: No abdominal wall masses or hernias.  Musculoskeletal: No acute or significant osseous findings. There is degenerative disc disease at L2-3.  IMPRESSION: 1. Distended fluid-filled colon, apparently secondary to fecal impaction of the rectum and sigmoid colon. No focal mucosal lesion or surrounding inflammatory change identified. 2. Mild biliary dilatation status post cholecystectomy, likely physiologic. 3. No other significant findings.   Electronically Signed   By: BCamie PatienceM.D.   On: 12/06/2013 21:31     EKG Interpretation None      MDM   Final diagnoses:  Fecal impaction  Nausea and vomiting, vomiting of unspecified type    Patient presents with constipation and vomiting. At baseline patient normally has diarrhea. She is uncomfortable appearing but nontoxic on exam. Vital signs are reassuring. Lab work reviewed from triage shows a leukocytosis to 18.2. Given this and concerns for acute obstruction (hx  of abdominal hysterectomy), will obtain CT scan. Patient was given pain medications and hydration. CT scan is negative for acute obstruction but does show fluid-filled colon and impaction of the rectum and sigmoid colon. Other lab work is unremarkable. Patient was manually disimpacted and given an enema with significant results. Patient reports improvement of symptoms. Patient's symptoms likely secondary to fecal impaction causing obstructive like symptoms. Patient has been able to tolerate fluids. Patient to  call her GI doctor tomorrow regarding bowel regimen. Patient stated understanding.  After history, exam, and medical workup I feel the patient has been appropriately medically screened and is safe for discharge home. Pertinent diagnoses were discussed with the patient. Patient was given return precautions.    Merryl Hacker, MD 12/06/13 [phone]

## 2013-12-06 NOTE — ED Notes (Signed)
Per pt, is having difficulty with bm.  Last bm 3 days ago.  Pt normally has diarrhea with IBS.  Today c/o nausea, vomiting since this morning.  Cramping and bloated.  Unable to keep water down. No difficulty with urination.

## 2013-12-06 NOTE — ED Notes (Signed)
Pt was able to keep down cup of water, states she is ready to be discharged, EDP made aware.

## 2013-12-06 NOTE — Telephone Encounter (Signed)
Patient states she is constipated now instead of diarrhea. States she has tried laxatives without results. She reports cramping, nausea and vomiting since this AM. States she cannot even keep water down at this time.  Patient instructed to to to Urgent care or ED for evaluation.

## 2013-12-06 NOTE — Discharge Instructions (Signed)
Fecal Impaction A fecal impaction happens when there is a large, firm amount of stool (or feces) that cannot be passed. The impacted stool is usually in the rectum, which is the lowest part of the large bowel. The impacted stool can block the colon and cause significant problems. CAUSES  The longer stool stays in the rectum, the harder it gets. Anything that slows down your bowel movements can lead to fecal impaction, such as:  Constipation. This can be a long-standing (chronic) problem or can happen suddenly (acute).  Painful conditions of the rectum, such as hemorrhoids or anal fissures. The pain of these conditions can make you try to avoid having bowel movements.  Narcotic pain-relieving medicines, such as methadone, morphine, or codeine.  Not drinking enough fluids.  Inactivity and bed rest over long periods of time.  Diseases of the brain or nervous system that damage the nerves controlling the muscles of the intestines. SIGNS AND SYMPTOMS   Lack of normal bowel movements or changes in bowel patterns.  Sense of fullness in the rectum but unable to pass stool.  Pain or cramps in the abdominal area (often after meals).  Thin, watery discharge from the rectum. DIAGNOSIS  Your health care provider may suspect that you have a fecal impaction based on your symptoms and a physical exam. This will include an exam of your rectum. Sometimes X-rays or lab testing may be needed to confirm the diagnosis and to be sure there are no other problems.  TREATMENT   Initially an impaction can be removed manually. Using a gloved finger, your health care provider can remove hard stool from your rectum.  Medicine is sometimes needed. A suppository or enema can be given in the rectum to soften the stool, which can stimulate a bowel movement. Medicines can also be given by mouth (orally).  Though rare, surgery may be needed if the colon has torn (perforated) due to blockage. HOME CARE INSTRUCTIONS    Develop regular bowel habits. This could include getting in the habit of having a bowel movement after your morning cup of coffee or after eating. Be sure to allow yourself enough time on the toilet.  Maintain a high-fiber diet.  Drink enough fluids to keep your urine clear or pale yellow as directed by your health care provider.  Exercise regularly.  If you begin to get constipated, increase the amount of fiber in your diet. Eat plenty of fruits, vegetables, whole wheat breads, bran, oatmeal, and similar products.  Take natural fiber laxatives or other laxatives only as directed by your health care provider. SEEK MEDICAL CARE IF:   You have ongoing rectal pain.  You require enemas or suppositories more than twice a week.  You have rectal bleeding.  You have continued problems, or you develop abdominal pain.  You have thin, pencil-like stools. SEEK IMMEDIATE MEDICAL CARE IF:  You have black or tarry stools. MAKE SURE YOU:   Understand these instructions.  Will watch your condition.  Will get help right away if you are not doing well or get worse. Document Released: 01/24/2004 Document Revised: 02/21/2013 Document Reviewed: 11/07/2012 [facility] Patient Information 2015 [place], [place]. This information is not intended to replace advice given to you by your health care provider. Make sure you discuss any questions you have with your health care provider.

## 2013-12-06 NOTE — ED Notes (Signed)
Pt states she went to restroom and had more results from enema, states feels better, given water to drink.

## 2013-12-07 NOTE — Telephone Encounter (Signed)
Patient had nausea, vomiting, cramping and constipation yesterday. She had not had a bowel movement in 5 days. She had stopped the Lomotil and Bentyl several days earlier and had taken laxatives. She went to ED last night and was impacted.  She was given enemas with relief. She was told to call Dr. Olevia Perches today for recommendations on adjusting medications. Please, advise.

## 2013-12-07 NOTE — Telephone Encounter (Signed)
Forward to Dr. Olevia Perches

## 2013-12-08 NOTE — Telephone Encounter (Signed)
Spoke with patient, feeling better, normal stools, queezy abdomen. Takes Zofran daily. I advised Peptobismol tabs 2 po bid prn. She will call with an update.

## 2013-12-14 ENCOUNTER — Encounter: Payer: Self-pay | Admitting: Specialist

## 2013-12-14 NOTE — Progress Notes (Signed)
Responded to message left on my phone.  Was unable to reach patient but left a message offering support.  Will follow up Monday.  Epifania Gore, PhD, [place]

## 2013-12-18 ENCOUNTER — Other Ambulatory Visit: Payer: Self-pay | Admitting: *Deleted

## 2013-12-18 MED ORDER — DICYCLOMINE HCL 20 MG PO TABS: 20.0000 mg | ORAL_TABLET | Freq: Two times a day (BID) | ORAL | Status: DC

## 2013-12-18 MED ORDER — ONDANSETRON HCL 4 MG PO TABS: 4.0000 mg | ORAL_TABLET | Freq: Every day | ORAL | Status: DC

## 2013-12-26 ENCOUNTER — Telehealth: Payer: Self-pay | Admitting: Internal Medicine

## 2013-12-26 DIAGNOSIS — R1319 Other dysphagia: Secondary | ICD-10-CM

## 2013-12-26 DIAGNOSIS — Z1211 Encounter for screening for malignant neoplasm of colon: Secondary | ICD-10-CM

## 2013-12-26 MED ORDER — OMEPRAZOLE 40 MG PO CPDR: 40.0000 mg | DELAYED_RELEASE_CAPSULE | Freq: Every day | ORAL | Status: DC

## 2013-12-26 NOTE — Telephone Encounter (Signed)
Rx sent 

## 2014-01-08 ENCOUNTER — Telehealth: Payer: Self-pay | Admitting: Internal Medicine

## 2014-01-08 DIAGNOSIS — Z1211 Encounter for screening for malignant neoplasm of colon: Secondary | ICD-10-CM

## 2014-01-08 DIAGNOSIS — R1319 Other dysphagia: Secondary | ICD-10-CM

## 2014-01-08 MED ORDER — OMEPRAZOLE 40 MG PO CPDR: DELAYED_RELEASE_CAPSULE | ORAL | Status: DC

## 2014-01-08 NOTE — Telephone Encounter (Signed)
Rx sent to [facility]

## 2014-01-08 NOTE — Telephone Encounter (Signed)
Patient calling to report her reflux is worse. It is waking her up with gurgling in her throat. She has a sore throat and coughs all the time. She is stressed because she is going through a divorce. She is taking Omeprazole 40 mg daily. She states the diarrhea she had is better with medication and diet changes. Please, advise on Reflux.

## 2014-01-08 NOTE — Telephone Encounter (Signed)
Spoke with pt. Increase Omeprazole to 40 mg po bid, Please send a new script to [facility] for 90 days, #[phone].

## 2014-01-09 ENCOUNTER — Telehealth: Payer: Self-pay | Admitting: Internal Medicine

## 2014-01-09 ENCOUNTER — Telehealth: Payer: Self-pay | Admitting: Family Medicine

## 2014-01-09 NOTE — Telephone Encounter (Signed)
Patient transferred from Dr. Linda Hedges to Dr. Lenna Gilford in [facility].  Patient is moving back to [place].  Back in March she had an appointment to establish with you but cancelled the appointment.  She is now requesting to get in with you.  She states that she does know you and she believes you will take her on as a patient.  Please advise.

## 2014-01-09 NOTE — Telephone Encounter (Signed)
Prior auth forms completed via [email or web address]. I am awaiting response from patient's insurance company.

## 2014-01-09 NOTE — Telephone Encounter (Signed)
Scheduled for next week

## 2014-01-09 NOTE — Telephone Encounter (Signed)
Ok with me 

## 2014-01-16 ENCOUNTER — Ambulatory Visit: Payer: BC Managed Care – PPO | Admitting: Internal Medicine

## 2014-01-16 NOTE — Telephone Encounter (Signed)
[facility] has approved patient's omeprazole from 12/10/13-01/09/15. ID # [id number].

## 2014-01-23 ENCOUNTER — Ambulatory Visit: Payer: BC Managed Care – PPO | Admitting: Internal Medicine

## 2014-02-05 ENCOUNTER — Ambulatory Visit: Payer: BC Managed Care – PPO | Admitting: Internal Medicine

## 2014-02-07 DIAGNOSIS — F4322 Adjustment disorder with anxiety: Secondary | ICD-10-CM

## 2014-02-07 HISTORY — DX: Adjustment disorder with anxiety: F43.22

## 2014-02-25 ENCOUNTER — Telehealth: Payer: Self-pay | Admitting: Internal Medicine

## 2014-02-25 MED ORDER — ESOMEPRAZOLE MAGNESIUM 40 MG PO CPDR: 40.0000 mg | DELAYED_RELEASE_CAPSULE | Freq: Two times a day (BID) | ORAL | Status: DC

## 2014-02-25 NOTE — Telephone Encounter (Signed)
Please try Nexiem 40 mg po bid, #60, 3 refills. If too expensive Protonix 40mg , #60,  1 po bid, 3 refills.

## 2014-02-25 NOTE — Telephone Encounter (Signed)
Left message for patient to call back  

## 2014-02-25 NOTE — Telephone Encounter (Signed)
Patient states she is taking Omeprazole 40 mg BID for acid reflux. She is not doing any better. She is asking for something else to help. Please, advise

## 2014-02-25 NOTE — Telephone Encounter (Signed)
Left a message for patient to call back. 

## 2014-02-25 NOTE — Telephone Encounter (Signed)
Spoke with patient and rx sent to [facility] for one month. If this works, she will call back and have rx sent to [facility].

## 2014-02-26 ENCOUNTER — Ambulatory Visit: Payer: BC Managed Care – PPO | Admitting: Internal Medicine

## 2014-03-01 ENCOUNTER — Telehealth: Payer: Self-pay | Admitting: *Deleted

## 2014-03-01 NOTE — Telephone Encounter (Signed)
[facility] has approved patient's Nexium from 01/29/14-02/28/15. PA number is # [id number].

## 2014-03-04 ENCOUNTER — Telehealth: Payer: Self-pay | Admitting: Internal Medicine

## 2014-03-05 NOTE — Telephone Encounter (Signed)
Left a message for patient to call back. 

## 2014-03-07 NOTE — Telephone Encounter (Signed)
Spoke with patient and told her Nexium is OTC.

## 2014-03-15 ENCOUNTER — Other Ambulatory Visit: Payer: Self-pay | Admitting: Internal Medicine

## 2014-03-18 ENCOUNTER — Other Ambulatory Visit: Payer: Self-pay | Admitting: *Deleted

## 2014-03-18 MED ORDER — DIPHENOXYLATE-ATROPINE 2.5-0.025 MG PO TABS: 1.0000 | ORAL_TABLET | Freq: Two times a day (BID) | ORAL | Status: DC | PRN

## 2014-03-22 ENCOUNTER — Ambulatory Visit (INDEPENDENT_AMBULATORY_CARE_PROVIDER_SITE_OTHER): Payer: BC Managed Care – PPO | Admitting: Psychology

## 2014-03-22 DIAGNOSIS — F4321 Adjustment disorder with depressed mood: Secondary | ICD-10-CM

## 2014-03-26 DIAGNOSIS — IMO0002 Reserved for concepts with insufficient information to code with codable children: Secondary | ICD-10-CM

## 2014-03-26 DIAGNOSIS — R109 Unspecified abdominal pain: Secondary | ICD-10-CM | POA: Insufficient documentation

## 2014-03-26 DIAGNOSIS — R6889 Other general symptoms and signs: Secondary | ICD-10-CM

## 2014-03-26 DIAGNOSIS — J45991 Cough variant asthma: Secondary | ICD-10-CM

## 2014-03-26 DIAGNOSIS — R1084 Generalized abdominal pain: Secondary | ICD-10-CM | POA: Insufficient documentation

## 2014-03-26 DIAGNOSIS — R769 Abnormal immunological finding in serum, unspecified: Secondary | ICD-10-CM

## 2014-03-26 HISTORY — DX: Other general symptoms and signs: R68.89

## 2014-03-26 HISTORY — DX: Cough variant asthma: J45.991

## 2014-03-26 HISTORY — DX: Unspecified abdominal pain: R10.9

## 2014-03-26 HISTORY — DX: Reserved for concepts with insufficient information to code with codable children: IMO0002

## 2014-04-01 ENCOUNTER — Ambulatory Visit (INDEPENDENT_AMBULATORY_CARE_PROVIDER_SITE_OTHER): Payer: BC Managed Care – PPO | Admitting: Psychology

## 2014-04-01 DIAGNOSIS — F4321 Adjustment disorder with depressed mood: Secondary | ICD-10-CM

## 2014-04-08 ENCOUNTER — Telehealth: Payer: Self-pay | Admitting: Internal Medicine

## 2014-04-08 MED ORDER — ESOMEPRAZOLE MAGNESIUM 40 MG PO CPDR: 40.0000 mg | DELAYED_RELEASE_CAPSULE | Freq: Two times a day (BID) | ORAL | Status: DC

## 2014-04-08 NOTE — Telephone Encounter (Signed)
Rx sent 

## 2014-04-10 ENCOUNTER — Ambulatory Visit: Payer: BC Managed Care – PPO | Admitting: Internal Medicine

## 2014-04-10 DIAGNOSIS — C52 Malignant neoplasm of vagina: Secondary | ICD-10-CM

## 2014-04-10 HISTORY — DX: Malignant neoplasm of vagina: C52

## 2014-04-15 ENCOUNTER — Ambulatory Visit: Payer: BC Managed Care – PPO | Admitting: Psychology

## 2014-05-17 HISTORY — PX: POSTERIOR FUSION CERVICAL SPINE: SUR628

## 2014-05-21 ENCOUNTER — Ambulatory Visit (INDEPENDENT_AMBULATORY_CARE_PROVIDER_SITE_OTHER): Payer: BC Managed Care – PPO | Admitting: Internal Medicine

## 2014-05-21 ENCOUNTER — Encounter: Payer: Self-pay | Admitting: Internal Medicine

## 2014-05-21 VITALS — BP 114/68 | HR 72 | Ht 62.75 in | Wt 111.1 lb

## 2014-05-21 DIAGNOSIS — K219 Gastro-esophageal reflux disease without esophagitis: Secondary | ICD-10-CM

## 2014-05-21 DIAGNOSIS — R197 Diarrhea, unspecified: Secondary | ICD-10-CM

## 2014-05-21 DIAGNOSIS — C499 Malignant neoplasm of connective and soft tissue, unspecified: Secondary | ICD-10-CM

## 2014-05-21 MED ORDER — TRAMADOL HCL 50 MG PO TABS: 50.0000 mg | ORAL_TABLET | Freq: Four times a day (QID) | ORAL | Status: DC | PRN

## 2014-05-21 MED ORDER — ONDANSETRON HCL 4 MG PO TABS: 4.0000 mg | ORAL_TABLET | Freq: Every day | ORAL | Status: DC

## 2014-05-21 MED ORDER — ESOMEPRAZOLE MAGNESIUM 40 MG PO CPDR: 40.0000 mg | DELAYED_RELEASE_CAPSULE | Freq: Two times a day (BID) | ORAL | Status: DC

## 2014-05-21 MED ORDER — CILIDINIUM-CHLORDIAZEPOXIDE 2.5-5 MG PO CAPS: 1.0000 | ORAL_CAPSULE | Freq: Three times a day (TID) | ORAL | Status: DC

## 2014-05-21 MED ORDER — DIPHENOXYLATE-ATROPINE 2.5-0.025 MG PO TABS: 1.0000 | ORAL_TABLET | Freq: Two times a day (BID) | ORAL | Status: DC | PRN

## 2014-05-21 NOTE — Progress Notes (Signed)
[place] [birth year] [id number]  Note: This dictation was prepared with Dragon digital system. Any transcriptional errors that result from this procedure are unintentional.   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 Patient developed postchemotherapy diarrhea and a weight loss of 30 pounds. She finally responded to treatment with Colestid, Flagyl and Bentyl. She underwent a laparoscopic cholecystectomy in December 2014. A colonoscopy in September 2014 with random biopsies showed no evidence of colitis. She has a history of esophageal stricture from prior upper endoscopies in 2001, 2005. Her most recent endoscopy in September 2014 showed no stricture. A 48 French Maloney dilator was passed at that time. She has been having ongoing problems with heartburn and reflux. She has recently switched from Prilosec 40 mg twice a day to Nexium 40 mg twice a day and feels much improved. She has been under a great deal of stress going through a separation. The Nexium has been much more effective in controlling her reflux. Her main complaints today are almost constant lower abdominal discomfort and soreness as well as a nausea. She needs refills for Lomotil, Nexium, Librax, Zofran, and tramadol.    Past Medical History  Diagnosis Date  . Deep phlebothrombosis, postpartum, unspecified as to episode of care   . Bipolar I disorder, most recent episode (or current) depressed, severe, without mention of psychotic behavior   . Other and unspecified hyperlipidemia   . IBS (irritable bowel syndrome)   . GERD (gastroesophageal reflux disease)   . Allergic rhinitis, cause unspecified   . Hyperlipidemia   . Gastritis   . Depression   . Anxiety   . Migraine headache   . Leiomyosarcoma of uterus      vaginal wall  . Blood transfusion without reported diagnosis   . Parathyroid adenoma 2014  . Status post chemotherapy 2014  . Asthma     chronic cough  . Fibromyalgia     Past Surgical History  Procedure Laterality Date  . Cesarean section  1986  . Vaginal hysterectomy    . Pubovaginal sling    . Neck surgery  2006    C spine fusion  . Laprotomy  04/2012    for cancer  . Colonoscopy    . Cholecystectomy N/A 05/03/2013    Procedure: LAPAROSCOPIC CHOLECYSTECTOMY WITH INTRAOPERATIVE CHOLANGIOGRAM;  Surgeon: Odis Hollingshead, MD;  Location: [facility];  Service: General;  Laterality: N/A;    Allergies  Allergen Reactions  . Codeine     REACTION: Disoriented  . Morphine And Related     Severe headache    Family history and social history have been reviewed.  Review of Systems: Positive for nausea. Positive for weight loss of 7 pounds since last appointment, intermittent diarrhea  The remainder of the 10 point ROS is negative except as outlined in the H&P  Physical Exam: General Appearance Well developed, in no distress Eyes  Non icteric  HEENT  Non traumatic, normocephalic  Mouth No lesion, tongue papillated, no cheilosis Neck Supple without adenopathy, thyroid not enlarged, no carotid bruits, no JVD Lungs Clear to auscultation bilaterally COR Normal S1, normal S2, regular rhythm, no murmur, quiet precordium Abdomen scaphoid. Soft. Normoactive bowel sounds. Mild diffuse tenderness in all quadrants. Well-healed surgical scar. No mass Rectal small amount of soft Hemoccult negative stool Extremities  No pedal edema Skin No lesions Neurological Alert and oriented x 3 Psychological Normal mood and affect  Assessment and Plan:   Problem #71 64 year old white female with gastroesophageal reflux controlled on Nexium 40 mg twice a day. We will refill this.  Problem #2 Leiomyosarcoma of the uterus and vagina post chemotherapy and surgical resection. She is getting PET scans  every 6 months and is followed by oncology.  Problem #3 Diarrhea and abdominal pain partially relieved with Librax. This is due to a combination of postsurgical changes in the pelvis and irritable bowel syndrome. Patient is going through a stressful separation and divorce. I will see her in 6 months. Consider CT scan of the abdomen. She is up-to-date on colonoscopy. Refill Zofran and Lomotil     [name] 05/21/2014

## 2014-05-21 NOTE — Patient Instructions (Signed)
We have sent the following prescriptions to your mail in pharmacy: Lomotil Zofran Librax Tramadol Nexium  If you have not heard from your mail in pharmacy within 1 week or if you have not received your medication in the mail, please contact us at [phone] so we may find out why.   Please follow up with Dr Olevia Perches in 6 months.  CC:Dr [employer]

## 2014-07-16 DIAGNOSIS — Z227 Latent tuberculosis: Secondary | ICD-10-CM

## 2014-07-16 HISTORY — DX: Latent tuberculosis: Z22.7

## 2014-09-12 ENCOUNTER — Emergency Department (HOSPITAL_COMMUNITY): Payer: BC Managed Care – PPO

## 2014-09-12 ENCOUNTER — Emergency Department (HOSPITAL_COMMUNITY)
Admission: EM | Admit: 2014-09-12 | Discharge: 2014-09-12 | Disposition: A | Discharge status: Home / Self Care | Payer: BC Managed Care – PPO | Attending: Emergency Medicine | Admitting: Emergency Medicine

## 2014-09-12 ENCOUNTER — Encounter (HOSPITAL_COMMUNITY): Payer: Self-pay | Admitting: Emergency Medicine

## 2014-09-12 ENCOUNTER — Ambulatory Visit (INDEPENDENT_AMBULATORY_CARE_PROVIDER_SITE_OTHER): Payer: BC Managed Care – PPO | Admitting: Psychology

## 2014-09-12 DIAGNOSIS — Y998 Other external cause status: Secondary | ICD-10-CM | POA: Insufficient documentation

## 2014-09-12 DIAGNOSIS — R51 Headache: Secondary | ICD-10-CM

## 2014-09-12 DIAGNOSIS — Z9221 Personal history of antineoplastic chemotherapy: Secondary | ICD-10-CM | POA: Insufficient documentation

## 2014-09-12 DIAGNOSIS — K589 Irritable bowel syndrome without diarrhea: Secondary | ICD-10-CM | POA: Diagnosis not present

## 2014-09-12 DIAGNOSIS — Z8541 Personal history of malignant neoplasm of cervix uteri: Secondary | ICD-10-CM | POA: Insufficient documentation

## 2014-09-12 DIAGNOSIS — E785 Hyperlipidemia, unspecified: Secondary | ICD-10-CM | POA: Insufficient documentation

## 2014-09-12 DIAGNOSIS — Z79899 Other long term (current) drug therapy: Secondary | ICD-10-CM | POA: Diagnosis not present

## 2014-09-12 DIAGNOSIS — S0990XA Unspecified injury of head, initial encounter: Secondary | ICD-10-CM | POA: Diagnosis not present

## 2014-09-12 DIAGNOSIS — K580 Irritable bowel syndrome with diarrhea: Secondary | ICD-10-CM | POA: Insufficient documentation

## 2014-09-12 DIAGNOSIS — S199XXA Unspecified injury of neck, initial encounter: Secondary | ICD-10-CM | POA: Insufficient documentation

## 2014-09-12 DIAGNOSIS — Y9389 Activity, other specified: Secondary | ICD-10-CM | POA: Diagnosis not present

## 2014-09-12 DIAGNOSIS — R519 Headache, unspecified: Secondary | ICD-10-CM

## 2014-09-12 DIAGNOSIS — F419 Anxiety disorder, unspecified: Secondary | ICD-10-CM | POA: Insufficient documentation

## 2014-09-12 DIAGNOSIS — M797 Fibromyalgia: Secondary | ICD-10-CM | POA: Diagnosis not present

## 2014-09-12 DIAGNOSIS — M542 Cervicalgia: Secondary | ICD-10-CM

## 2014-09-12 DIAGNOSIS — G43909 Migraine, unspecified, not intractable, without status migrainosus: Secondary | ICD-10-CM | POA: Insufficient documentation

## 2014-09-12 DIAGNOSIS — J45909 Unspecified asthma, uncomplicated: Secondary | ICD-10-CM | POA: Insufficient documentation

## 2014-09-12 DIAGNOSIS — Z86018 Personal history of other benign neoplasm: Secondary | ICD-10-CM | POA: Insufficient documentation

## 2014-09-12 DIAGNOSIS — F4321 Adjustment disorder with depressed mood: Secondary | ICD-10-CM

## 2014-09-12 DIAGNOSIS — Y92410 Unspecified street and highway as the place of occurrence of the external cause: Secondary | ICD-10-CM | POA: Insufficient documentation

## 2014-09-12 DIAGNOSIS — F314 Bipolar disorder, current episode depressed, severe, without psychotic features: Secondary | ICD-10-CM | POA: Diagnosis not present

## 2014-09-12 DIAGNOSIS — K219 Gastro-esophageal reflux disease without esophagitis: Secondary | ICD-10-CM | POA: Insufficient documentation

## 2014-09-12 HISTORY — DX: Irritable bowel syndrome with diarrhea: K58.0

## 2014-09-12 MED ORDER — KETOROLAC TROMETHAMINE 60 MG/2ML IM SOLN
30.0000 mg | Freq: Once | INTRAMUSCULAR | Status: AC
Start: 2014-09-12 — End: 2014-09-12
  Administered 2014-09-12: 30 mg via INTRAMUSCULAR
  Filled 2014-09-12: qty 2

## 2014-09-12 MED ORDER — NAPROXEN 250 MG PO TABS: 250.0000 mg | ORAL_TABLET | Freq: Two times a day (BID) | ORAL | Status: DC

## 2014-09-12 MED ORDER — METHOCARBAMOL 500 MG PO TABS: 500.0000 mg | ORAL_TABLET | Freq: Two times a day (BID) | ORAL | Status: DC | PRN
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
## 2014-09-12 NOTE — ED Notes (Signed)
Patient transported to CT 
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
## 2014-09-16 ENCOUNTER — Ambulatory Visit: Payer: BC Managed Care – PPO | Admitting: Psychology

## 2014-09-18 DIAGNOSIS — M5412 Radiculopathy, cervical region: Secondary | ICD-10-CM

## 2014-09-18 DIAGNOSIS — S060X0A Concussion without loss of consciousness, initial encounter: Secondary | ICD-10-CM

## 2014-09-18 HISTORY — DX: Radiculopathy, cervical region: M54.12

## 2014-09-18 HISTORY — DX: Concussion without loss of consciousness, initial encounter: S06.0X0A

## 2014-09-25 ENCOUNTER — Ambulatory Visit: Payer: BC Managed Care – PPO | Admitting: Psychology

## 2014-10-03 ENCOUNTER — Ambulatory Visit: Payer: BC Managed Care – PPO | Admitting: Psychology

## 2014-10-24 ENCOUNTER — Encounter (HOSPITAL_COMMUNITY): Payer: Self-pay | Admitting: Emergency Medicine

## 2014-10-24 ENCOUNTER — Emergency Department (HOSPITAL_COMMUNITY)
Admission: EM | Admit: 2014-10-24 | Discharge: 2014-10-24 | Disposition: A | Discharge status: Other Acute Inpatient Hospital | Payer: BLUE CROSS/BLUE SHIELD | Attending: Emergency Medicine | Admitting: Emergency Medicine

## 2014-10-24 ENCOUNTER — Ambulatory Visit (INDEPENDENT_AMBULATORY_CARE_PROVIDER_SITE_OTHER): Payer: BLUE CROSS/BLUE SHIELD | Admitting: Psychology

## 2014-10-24 ENCOUNTER — Inpatient Hospital Stay (HOSPITAL_COMMUNITY)
Admission: AD | Admit: 2014-10-24 | Discharge: 2014-10-28 | DRG: 885 | Disposition: A | Discharge status: Home / Self Care | Payer: BLUE CROSS/BLUE SHIELD | Source: Intra-hospital | Attending: Psychiatry | Admitting: Psychiatry

## 2014-10-24 DIAGNOSIS — Z9221 Personal history of antineoplastic chemotherapy: Secondary | ICD-10-CM | POA: Diagnosis not present

## 2014-10-24 DIAGNOSIS — K219 Gastro-esophageal reflux disease without esophagitis: Secondary | ICD-10-CM | POA: Diagnosis not present

## 2014-10-24 DIAGNOSIS — Z8542 Personal history of malignant neoplasm of other parts of uterus: Secondary | ICD-10-CM | POA: Insufficient documentation

## 2014-10-24 DIAGNOSIS — F0781 Postconcussional syndrome: Secondary | ICD-10-CM | POA: Diagnosis not present

## 2014-10-24 DIAGNOSIS — R45851 Suicidal ideations: Secondary | ICD-10-CM

## 2014-10-24 DIAGNOSIS — J45909 Unspecified asthma, uncomplicated: Secondary | ICD-10-CM | POA: Diagnosis not present

## 2014-10-24 DIAGNOSIS — M797 Fibromyalgia: Secondary | ICD-10-CM | POA: Diagnosis not present

## 2014-10-24 DIAGNOSIS — F332 Major depressive disorder, recurrent severe without psychotic features: Secondary | ICD-10-CM | POA: Diagnosis present

## 2014-10-24 DIAGNOSIS — F329 Major depressive disorder, single episode, unspecified: Secondary | ICD-10-CM | POA: Insufficient documentation

## 2014-10-24 DIAGNOSIS — F4321 Adjustment disorder with depressed mood: Secondary | ICD-10-CM | POA: Diagnosis not present

## 2014-10-24 DIAGNOSIS — Z79899 Other long term (current) drug therapy: Secondary | ICD-10-CM

## 2014-10-24 DIAGNOSIS — G43909 Migraine, unspecified, not intractable, without status migrainosus: Secondary | ICD-10-CM | POA: Insufficient documentation

## 2014-10-24 DIAGNOSIS — F319 Bipolar disorder, unspecified: Secondary | ICD-10-CM | POA: Diagnosis present

## 2014-10-24 DIAGNOSIS — F419 Anxiety disorder, unspecified: Secondary | ICD-10-CM | POA: Diagnosis not present

## 2014-10-24 DIAGNOSIS — N390 Urinary tract infection, site not specified: Secondary | ICD-10-CM | POA: Diagnosis present

## 2014-10-24 DIAGNOSIS — E785 Hyperlipidemia, unspecified: Secondary | ICD-10-CM | POA: Diagnosis not present

## 2014-10-24 DIAGNOSIS — Z86018 Personal history of other benign neoplasm: Secondary | ICD-10-CM | POA: Diagnosis not present

## 2014-10-24 DIAGNOSIS — F32A Depression, unspecified: Secondary | ICD-10-CM | POA: Diagnosis present

## 2014-10-24 LAB — COMPREHENSIVE METABOLIC PANEL
ALT: 16 U/L (ref 14–54)
AST: 26 U/L (ref 15–41)
Albumin: 4.6 g/dL (ref 3.5–5.0)
Alkaline Phosphatase: 138 U/L — ABNORMAL HIGH (ref 38–126)
Anion gap: 11 (ref 5–15)
BUN: 20 mg/dL (ref 6–20)
CO2: 24 mmol/L (ref 22–32)
Calcium: 11.3 mg/dL — ABNORMAL HIGH (ref 8.9–10.3)
Chloride: 105 mmol/L (ref 101–111)
Creatinine, Ser: 0.95 mg/dL (ref 0.44–1.00)
GFR calc Af Amer: >60 mL/min (ref >60)
GFR calc non Af Amer: >60 mL/min (ref >60)
Glucose, Bld: 103 mg/dL — ABNORMAL HIGH (ref 65–99)
Potassium: 3.8 mmol/L (ref 3.5–5.1)
Sodium: 140 mmol/L (ref 135–145)
Total Bilirubin: 0.8 mg/dL (ref 0.3–1.2)
Total Protein: 8.1 g/dL (ref 6.5–8.1)

## 2014-10-24 LAB — CBC
HCT: 41.8 % (ref 36.0–46.0)
Hemoglobin: 13.7 g/dL (ref 12.0–15.0)
MCH: 29.8 pg (ref 26.0–34.0)
MCHC: 32.8 g/dL (ref 30.0–36.0)
MCV: 90.9 fL (ref 78.0–100.0)
Platelets: 323 10*3/uL (ref 150–400)
RBC: 4.6 MIL/uL (ref 3.87–5.11)
RDW: 13.5 % (ref 11.5–15.5)
WBC: 11.6 10*3/uL — ABNORMAL HIGH (ref 4.0–10.5)

## 2014-10-24 LAB — ACETAMINOPHEN LEVEL: Acetaminophen (Tylenol), Serum: <10 ug/mL — ABNORMAL LOW (ref 10–30)

## 2014-10-24 LAB — SALICYLATE LEVEL: Salicylate Lvl: <4 mg/dL (ref 2.8–30.0)

## 2014-10-24 LAB — RAPID URINE DRUG SCREEN, HOSP PERFORMED
Amphetamines: NOT DETECTED
Barbiturates: NOT DETECTED
Benzodiazepines: POSITIVE — AB
Cocaine: NOT DETECTED
Opiates: NOT DETECTED
Tetrahydrocannabinol: NOT DETECTED

## 2014-10-24 LAB — ETHANOL: Alcohol, Ethyl (B): <5 mg/dL (ref <5)

## 2014-10-24 MED ORDER — ACETAMINOPHEN 325 MG PO TABS
650.0000 mg | ORAL_TABLET | Freq: Four times a day (QID) | ORAL | Status: DC | PRN
  Administered 2014-10-24: 650 mg via ORAL
  Filled 2014-10-24: qty 2

## 2014-10-24 MED ORDER — LEVOTHYROXINE SODIUM 75 MCG PO TABS
75.0000 ug | ORAL_TABLET | Freq: Every day | ORAL | Status: DC
  Filled 2014-10-24: qty 1

## 2014-10-24 MED ORDER — QUETIAPINE FUMARATE 300 MG PO TABS
400.0000 mg | ORAL_TABLET | Freq: Every day | ORAL | Status: DC
  Administered 2014-10-24: 400 mg via ORAL
  Filled 2014-10-24 (×2): qty 1

## 2014-10-24 MED ORDER — CLONAZEPAM 0.5 MG PO TABS
0.5000 mg | ORAL_TABLET | Freq: Two times a day (BID) | ORAL | Status: DC | PRN
  Administered 2014-10-24 (×2): 0.5 mg via ORAL
  Filled 2014-10-24 (×2): qty 1

## 2014-10-24 MED ORDER — VITAMIN D (ERGOCALCIFEROL) 1.25 MG (50000 UNIT) PO CAPS
50000.0000 [IU] | ORAL_CAPSULE | ORAL | Status: DC
  Administered 2014-10-24: 50000 [IU] via ORAL
  Filled 2014-10-24: qty 1

## 2014-10-24 MED ORDER — TOPIRAMATE 25 MG PO TABS
50.0000 mg | ORAL_TABLET | Freq: Every day | ORAL | Status: DC
  Administered 2014-10-24: 50 mg via ORAL
  Filled 2014-10-24: qty 2

## 2014-10-24 MED ORDER — PANTOPRAZOLE SODIUM 40 MG PO TBEC: 40.0000 mg | DELAYED_RELEASE_TABLET | Freq: Every day | ORAL | Status: DC

## 2014-10-24 MED ORDER — LURASIDONE HCL 20 MG PO TABS
20.0000 mg | ORAL_TABLET | Freq: Every day | ORAL | Status: DC
  Administered 2014-10-24: 20 mg via ORAL
  Filled 2014-10-24: qty 1

## 2014-10-24 MED ORDER — FLUOXETINE HCL 20 MG PO CAPS: 20.0000 mg | ORAL_CAPSULE | Freq: Every day | ORAL | Status: DC

## 2014-10-24 NOTE — ED Notes (Signed)
Patient reports anxiety increasing, 8/10.  Encouragement offered. Given [place] and Klonopin
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
## 2014-10-24 NOTE — ED Notes (Signed)
Pt has been having increased anxiety and depression, Pt has been having SI but does not think she will act on anything. Pt medications has been recently changed and thinks the meds have not had time to take affect.

## 2014-10-24 NOTE — ED Provider Notes (Signed)
CSN: [id number]     Arrival date & time 10/24/14  1123 History   First MD Initiated Contact with Patient 10/24/14 1153     Chief Complaint  Patient presents with  . Depression  . Suicidal     The history is provided by the patient. No language interpreter was used.   [name] presents for evaluation of suicidal ideation and depressive thoughts. She was a long-standing history of depression but has been much worse over the last 3 weeks. She's had a lot of social situational stressors. She's tried multiple medications without improvement in her symptoms. She is followed by a psychologist and a psychiatrist. She states that she has nausea, decreased appetite, anhedonia. She doesn't want to leave the house and has trouble making herself get up to go to work her church. She states she has dark thoughts of harming herself at times but does not think she would act on it. She did have guns in the home but she gave this to a family member. She does have multiple pills that she could overdose on. Symptoms are severe, constant, worsening.  Past Medical History  Diagnosis Date  . Deep phlebothrombosis, postpartum, unspecified as to episode of care   . Bipolar I disorder, most recent episode (or current) depressed, severe, without mention of psychotic behavior   . Other and unspecified hyperlipidemia   . IBS (irritable bowel syndrome)   . GERD (gastroesophageal reflux disease)   . Allergic rhinitis, cause unspecified   . Hyperlipidemia   . Gastritis   . Depression   . Anxiety   . Migraine headache   . Leiomyosarcoma of uterus     vaginal wall  . Blood transfusion without reported diagnosis   . Parathyroid adenoma 2014  . Status post chemotherapy 2014  . Asthma     chronic cough  . Fibromyalgia    Past Surgical History  Procedure Laterality Date  . Cesarean section  1986  . Vaginal hysterectomy    . Pubovaginal sling    . Neck surgery  2006    C spine fusion  . Laprotomy  04/2012   for cancer  . Colonoscopy    . Cholecystectomy N/A 05/03/2013    Procedure: LAPAROSCOPIC CHOLECYSTECTOMY WITH INTRAOPERATIVE CHOLANGIOGRAM;  Surgeon: Odis Hollingshead, MD;  Location: [place];  Service: General;  Laterality: N/A;   Family History  Problem Relation Age of Onset  . Coronary artery disease Father   . Heart disease Father   . Hypertension Father   . Irritable bowel syndrome Father   . Diabetes Maternal Grandmother   . Arthritis Mother   . Hypertension Mother   . Migraines Mother   . Heart disease Mother   . Irritable bowel syndrome Sister   . Irritable bowel syndrome Brother   . Colon cancer Neg Hx   . Esophageal cancer Neg Hx   . Rectal cancer Neg Hx   . Stomach cancer Neg Hx    History  Substance Use Topics  . Smoking status: Never Smoker   . Smokeless tobacco: Never Used  . Alcohol Use: 1.0 - 1.5 oz/week    2-3 drink(s) per week     Comment: rarely   OB History    No data available     Review of Systems  All other systems reviewed and are negative.     Allergies  Codeine; Dust mite extract; Molds & smuts; and Morphine and related  Home Medications   Prior to Admission medications  Medication Sig Start Date End Date Taking? Authorizing Provider  ALPRAZolam Duanne Moron) 0.5 MG tablet Take 0.5 mg by mouth at bedtime as needed for sleep.   Yes Historical Provider, MD  diphenoxylate-atropine (LOMOTIL) 2.5-0.025 MG per tablet Take 1 tablet by mouth 2 (two) times daily as needed for diarrhea or loose stools. Patient taking differently: Take 1 tablet by mouth daily.  05/21/14  Yes Lafayette Dragon, MD  esomeprazole (NEXIUM) 40 MG capsule Take 1 capsule (40 mg total) by mouth 2 (two) times daily before a meal. Patient taking differently: Take 20 mg by mouth daily.  05/21/14  Yes Lafayette Dragon, MD  estradiol (ESTRACE) 1 MG tablet take 1 tablet by mouth once daily 01/05/12  Yes Neena Rhymes, MD  FLUoxetine (PROZAC) 20 MG capsule Take 20 mg by mouth daily at 8 pm.  10/20/14  Yes Historical Provider, MD  levothyroxine (SYNTHROID, LEVOTHROID) 75 MCG tablet Take 1 tablet (75 mcg total) by mouth daily before breakfast. Patient taking differently: Take 100 mcg by mouth daily before breakfast.  04/25/13  Yes Renato Shin, MD  Lurasidone HCl 20 MG TABS Take 20 mg by mouth daily at 8 pm.   Yes Historical Provider, MD  Multiple Vitamins-Minerals (MULTIVITAMIN WITH MINERALS) tablet Take 1 tablet by mouth daily.   Yes Historical Provider, MD  ondansetron (ZOFRAN) 4 MG tablet Take 1 tablet (4 mg total) by mouth at bedtime. Patient taking differently: Take 4 mg by mouth daily at 8 pm.  05/21/14  Yes Lafayette Dragon, MD  progesterone (PROMETRIUM) 100 MG capsule Take 100 mg by mouth 2 (two) times a week.   Yes Historical Provider, MD  QUEtiapine (SEROQUEL) 400 MG tablet Take 400 mg by mouth at bedtime.   Yes Historical Provider, MD  topiramate (TOPAMAX) 100 MG tablet Take 50 mg by mouth daily.    Yes Historical Provider, MD  Vitamin D, Ergocalciferol, (DRISDOL) 50000 UNITS CAPS capsule Take 50,000 Units by mouth 2 (two) times a week. Wednesday   Yes Historical Provider, MD  clidinium-chlordiazePOXIDE (LIBRAX) 5-2.5 MG per capsule Take 1 capsule by mouth 3 (three) times daily. Patient not taking: Reported on 10/24/2014 05/21/14   Lafayette Dragon, MD  dicyclomine (BENTYL) 20 MG tablet Take 1 tablet (20 mg total) by mouth 2 (two) times daily. Patient not taking: Reported on 10/24/2014 12/18/13   Lafayette Dragon, MD  lithium carbonate (ESKALITH) 450 MG CR tablet Take 1/2 tablet by mouth at bedtime for 1 week, then take 1 tablet by mouth at bedtime 10/23/14   Historical Provider, MD  methocarbamol (ROBAXIN) 500 MG tablet Take 1 tablet (500 mg total) by mouth 2 (two) times daily as needed for muscle spasms. Patient not taking: Reported on 10/24/2014 09/12/14   Waynetta Pean, PA-C  naproxen (NAPROSYN) 250 MG tablet Take 1 tablet (250 mg total) by mouth 2 (two) times daily with a meal. Patient not  taking: Reported on 10/24/2014 09/12/14   Waynetta Pean, PA-C  omeprazole (PRILOSEC) 40 MG capsule Take one po BID Patient not taking: Reported on 10/24/2014 01/08/14   Lafayette Dragon, MD  SUMAtriptan (IMITREX) 6 MG/0.5ML SOLN injection Inject 6 mg into the skin every 2 (two) hours as needed for migraine or headache. May repeat in 2 hours if headache persists or recurs.    Historical Provider, MD  traMADol (ULTRAM) 50 MG tablet Take 1 tablet (50 mg total) by mouth every 6 (six) hours as needed (pain). Patient not taking: Reported on 10/24/2014  05/21/14   Lafayette Dragon, MD   BP 156/87 mmHg  Pulse 74  Temp(Src) 97.9 F (36.6 C) (Oral)  Resp 20  SpO2 100% Physical Exam  Constitutional: She is oriented to person, place, and time. She appears well-developed and well-nourished.  HENT:  Head: Normocephalic and atraumatic.  Eyes: Pupils are equal, round, and reactive to light.  Cardiovascular: Normal rate and regular rhythm.   Pulmonary/Chest: Effort normal. No respiratory distress.  Musculoskeletal: She exhibits no edema.  Neurological: She is alert and oriented to person, place, and time.  Skin: Skin is warm and dry.  Psychiatric:  Tearful, anxious  Nursing note and vitals reviewed.   ED Course  Procedures (including critical care time) Labs Review Labs Reviewed  ACETAMINOPHEN LEVEL - Abnormal; Notable for the following:    Acetaminophen (Tylenol), Serum <10 (*)    All other components within normal limits  CBC - Abnormal; Notable for the following:    WBC 11.6 (*)    All other components within normal limits  COMPREHENSIVE METABOLIC PANEL - Abnormal; Notable for the following:    Glucose, Bld 103 (*)    Calcium 11.3 (*)    Alkaline Phosphatase 138 (*)    All other components within normal limits  URINE RAPID DRUG SCREEN (HOSP PERFORMED) NOT AT [facility] - Abnormal; Notable for the following:    Benzodiazepines POSITIVE (*)    All other components within normal limits  ETHANOL  SALICYLATE  LEVEL    Imaging Review No results found.   EKG Interpretation None      MDM   Final diagnoses:  Suicidal ideation    Patient with history of depression here with worsening depressive symptoms as well as vague as side. Patient has been medically cleared for psychiatric evaluation. Patient is here voluntarily.  Quintella Reichert, MD 10/24/14 [phone]

## 2014-10-24 NOTE — ED Notes (Signed)
Previous note charted on wrong pt

## 2014-10-24 NOTE — ED Notes (Signed)
Pt oriented to room and unit.  Alert and oriented x 3.  Pt contracts for safety.  She appears shaky and has flat affect.  Denies pain or discomfort.  States she is sleepy but unable to sleep or eat.  Pt given [place] sandwich.  15 minute checks in place

## 2014-10-24 NOTE — ED Notes (Signed)
Patient denies SI, HI, AVH. Reports feelings of anxiety improved at 7/10, and feelings of depression 7/10. Patient pleasant, cooperative.   Encouragement offered.  Q 15 safety checks continue.

## 2014-10-24 NOTE — ED Notes (Signed)
Pt complains of anxiety. Nurse Practitioner notified

## 2014-10-25 ENCOUNTER — Encounter (HOSPITAL_COMMUNITY): Payer: Self-pay

## 2014-10-25 DIAGNOSIS — F329 Major depressive disorder, single episode, unspecified: Secondary | ICD-10-CM | POA: Diagnosis present

## 2014-10-25 DIAGNOSIS — F332 Major depressive disorder, recurrent severe without psychotic features: Principal | ICD-10-CM

## 2014-10-25 DIAGNOSIS — F32A Depression, unspecified: Secondary | ICD-10-CM | POA: Diagnosis present

## 2014-10-25 HISTORY — DX: Depression, unspecified: F32.A

## 2014-10-25 MED ORDER — BUPROPION HCL ER (XL) 150 MG PO TB24
150.0000 mg | ORAL_TABLET | Freq: Every day | ORAL | Status: DC
  Administered 2014-10-25 – 2014-10-28 (×4): 150 mg via ORAL
  Filled 2014-10-25 (×6): qty 1
  Filled 2014-10-25: qty 3

## 2014-10-25 MED ORDER — DIPHENOXYLATE-ATROPINE 2.5-0.025 MG PO TABS
1.0000 | ORAL_TABLET | Freq: Four times a day (QID) | ORAL | Status: DC | PRN
  Administered 2014-10-27: 1 via ORAL
  Filled 2014-10-25: qty 1

## 2014-10-25 MED ORDER — LEVOTHYROXINE SODIUM 100 MCG PO TABS
100.0000 ug | ORAL_TABLET | Freq: Every day | ORAL | Status: DC
  Administered 2014-10-26 – 2014-10-28 (×3): 100 ug via ORAL
  Filled 2014-10-25 (×2): qty 1
  Filled 2014-10-25: qty 3
  Filled 2014-10-25 (×3): qty 1

## 2014-10-25 MED ORDER — ENSURE ENLIVE PO LIQD
237.0000 mL | Freq: Two times a day (BID) | ORAL | Status: DC
  Administered 2014-10-25 – 2014-10-28 (×5): 237 mL via ORAL

## 2014-10-25 MED ORDER — PANTOPRAZOLE SODIUM 40 MG PO TBEC
40.0000 mg | DELAYED_RELEASE_TABLET | Freq: Every day | ORAL | Status: DC
  Administered 2014-10-25 – 2014-10-28 (×4): 40 mg via ORAL
  Filled 2014-10-25 (×7): qty 1

## 2014-10-25 MED ORDER — TOPIRAMATE 25 MG PO TABS
50.0000 mg | ORAL_TABLET | Freq: Every day | ORAL | Status: DC
  Administered 2014-10-25 – 2014-10-28 (×4): 50 mg via ORAL
  Filled 2014-10-25 (×2): qty 2
  Filled 2014-10-25: qty 6
  Filled 2014-10-25 (×4): qty 2

## 2014-10-25 MED ORDER — ACETAMINOPHEN 325 MG PO TABS
650.0000 mg | ORAL_TABLET | Freq: Four times a day (QID) | ORAL | Status: DC | PRN
  Administered 2014-10-26 – 2014-10-28 (×4): 650 mg via ORAL
  Filled 2014-10-25 (×4): qty 2

## 2014-10-25 MED ORDER — MIRTAZAPINE 15 MG PO TABS
15.0000 mg | ORAL_TABLET | Freq: Every day | ORAL | Status: DC
  Administered 2014-10-25 – 2014-10-27 (×3): 15 mg via ORAL
  Filled 2014-10-25: qty 1
  Filled 2014-10-25: qty 3
  Filled 2014-10-25 (×4): qty 1

## 2014-10-25 MED ORDER — ALUM & MAG HYDROXIDE-SIMETH 200-200-20 MG/5ML PO SUSP: 30.0000 mL | ORAL | Status: DC | PRN

## 2014-10-25 MED ORDER — MAGNESIUM HYDROXIDE 400 MG/5ML PO SUSP: 30.0000 mL | Freq: Every day | ORAL | Status: DC | PRN

## 2014-10-25 MED ORDER — TRAZODONE HCL 50 MG PO TABS
50.0000 mg | ORAL_TABLET | Freq: Every evening | ORAL | Status: DC | PRN
  Filled 2014-10-25: qty 3

## 2014-10-25 MED ORDER — ONDANSETRON 4 MG PO TBDP
4.0000 mg | ORAL_TABLET | Freq: Three times a day (TID) | ORAL | Status: DC | PRN
  Administered 2014-10-27 – 2014-10-28 (×2): 4 mg via ORAL
  Filled 2014-10-25 (×2): qty 1

## 2014-10-25 MED ORDER — LORAZEPAM 0.5 MG PO TABS
0.5000 mg | ORAL_TABLET | Freq: Four times a day (QID) | ORAL | Status: DC | PRN
  Administered 2014-10-25 – 2014-10-28 (×6): 0.5 mg via ORAL
  Filled 2014-10-25 (×6): qty 1

## 2014-10-25 NOTE — Progress Notes (Signed)
NUTRITION NOTE  NUTRITION ASSESSMENT  Pt identified as at risk on the Malnutrition Screen Tool  INTERVENTION: 1. Educated patient on the importance of nutrition and encouraged intake of food and beverages. 2. Discussed weight goals. 3. Supplements: will order Ensure Enlive po BID, each supplement provides 350 kcal and 20 grams of protein   NUTRITION DIAGNOSIS: Unintentional weight loss related to  IBS, chemotherapy, stressful home situation intake as evidenced by pt report.   Goal: Pt to meet >/= 90% of their estimated nutrition needs.  Monitor:  PO intake  Assessment:  Pt seen for MST. Pt has lost 50 lbs in unknown time frame. She indicates this is due to chemotherapy effects, IBS with need to be careful about what she eats, and stressful home situation. Per weight hx review below, pt has lost 8 lbs (7% body weight) in the past 1 year which is not significant for time frame.   Will order supplement to aid.  64 y.o. female  Height: Ht Readings from Last 1 Encounters:  10/24/14 '5\' 3"'$  (1.6 m)    Weight: Wt Readings from Last 1 Encounters:  10/24/14 110 lb (49.896 kg)    Weight Hx: Wt Readings from Last 10 Encounters:  10/24/14 110 lb (49.896 kg)  05/21/14 111 lb 2 oz (50.406 kg)  11/06/13 118 lb 2 oz (53.581 kg)  10/02/13 122 lb 12.8 oz (55.702 kg)  06/26/13 121 lb 6.4 oz (55.067 kg)  04/30/13 122 lb 9.6 oz (55.611 kg)  04/25/13 123 lb (55.792 kg)  04/24/13 122 lb (55.339 kg)  04/16/13 121 lb 3.2 oz (54.976 kg)  01/17/13 121 lb 2.1 oz (54.944 kg)    BMI:  Body mass index is 19.49 kg/(m^2). Pt meets criteria for normal weight status based on current BMI.  Estimated Nutritional Needs: Kcal: 25-30 kcal/kg Protein: > 1 gram protein/kg Fluid: 1 ml/kcal  Diet Order:   Pt is also offered choice of unit snacks mid-morning and mid-afternoon.  Pt is eating as desired.   Lab results and medications reviewed.    Jarome Matin, RD, LDN Inpatient Clinical  Dietitian Pager # [phone] After hours/weekend pager # [phone]

## 2014-10-25 NOTE — Tx Team (Signed)
Interdisciplinary Treatment Plan Update (Adult) Date: 10/25/2014   Time Reviewed: 9:30 AM  Progress in Treatment: Attending groups: Continuing to assess, patient new to milieu Participating in groups: Continuing to assess, patient new to milieu Taking medication as prescribed: Yes Tolerating medication: Yes Family/Significant other contact made: Continuing to assess for appropriate contacts Patient understands diagnosis: Yes Discussing patient identified problems/goals with staff: Yes Medical problems stabilized or resolved: Yes Denies suicidal/homicidal ideation: Yes Issues/concerns per patient self-inventory: Yes Other:  New problem(s) identified: N/A  Discharge Plan or Barriers:  6/10: Patient plans to return home to follow up with outpatient services at [facility].  Reason for Continuation of Hospitalization:  Depression Anxiety Medication Stabilization   Comments: N/A  Estimated length of stay: 2-3 days  For review of initial/current patient goals, please see plan of care.   Attendees: Patient 10/25/2014 8:22 AM   Family:  10/25/2014 8:22 AM   Physician: Neita Garnet, MD 10/25/2014 8:22 AM   Nursing: Sumner Boast RN 10/25/2014 8:22 AM   Clinical Social Worker: Peri Maris, LCSWA  10/25/2014 8:22 AM   Clinical Social Worker: Tilden Fossa, LCSW-A 10/25/2014 8:22 AM   Case Manager: Lars Pinks, RN 10/25/2014 8:22 AM   Other: Lucinda Dell, Beverly Sessions Liaison  10/25/2014 8:22 AM  Other:  10/25/2014 8:22 AM   Other:  10/25/2014 8:22 AM   Other:  10/25/2014 8:22 AM   Other:                                       Scribe for Treatment Team:  Tilden Fossa, MSW, [employer] [phone]

## 2014-10-25 NOTE — [facility] Suicide Risk Assessment (Addendum)
[facility] Admission Suicide Risk Assessment   Nursing information obtained from:  Patient Demographic factors:  Divorced or widowed, Caucasian, Living alone Current Mental Status:  Self-harm thoughts Loss Factors:  Loss of significant relationship, Financial problems / change in socioeconomic status Historical Factors:  Family history of mental illness or substance abuse Risk Reduction Factors:  Sense of responsibility to family, Religious beliefs about death, Positive social support Total Time spent with patient: 45 minutes Principal Problem: Depression Diagnosis:   Patient Active Problem List   Diagnosis Date Noted  . Abnormal chest x-ray [R93.8]   . Unspecified hypothyroidism [E03.9] 04/25/2013  . Unspecified vitamin D deficiency [E55.9] 04/25/2013  . 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 Elevated blood pressure reading without diagnosis of hypertension [R03.0] 10/30/2010  . Healthcare maintenance [Z00.00] 10/30/2010  . MUSCLE PAIN [M79.1, M60.9] 07/03/2008  . BIPLR I MOST RECENT EPIS DPRS SEV NO PSYCHOT BHV [F31.4] 02/14/2008  . DEEP PHLEBOTHROMBOSIS PP UNSPEC AS EPIS CARE [O87.1] 02/14/2008  . HYPERLIPIDEMIA [E78.5] 03/04/2007  . ALLERGIC RHINITIS [J30.9] 03/04/2007  . GERD [K21.9] 03/04/2007  . IRRITABLE BOWEL SYNDROME, HX OF [Z87.19] 03/04/2007     Continued Clinical Symptoms:  Alcohol Use Disorder Identification Test Final Score (AUDIT): 1 The "Alcohol Use Disorders Identification Test", Guidelines for Use in Primary Care, Second Edition.  World Pharmacologist [facility]). Score between 0-7:  no or low risk or alcohol related problems. Score between 8-15:  moderate risk of alcohol related problems. Score between 16-19:  high risk of alcohol related problems. Score 20 or above:  warrants further diagnostic evaluation for alcohol dependence and treatment.   CLINICAL FACTORS:  64 year old female. She states she has had a series of losses over recent years, to include  divorce, retirement, history of cancer, now living alone .  States she has a long history of anxiety and depression. She saw her therapist yesterday, and he was concerned about worsening of her  symptoms.  Patient states she had " been feeling worse than I ever had, and had started having  Passive thoughts of death/dying." States " I have not been eating well, have not been sleeping, even simple chores are overwhelming, all I want to do is stay in bed ".  She denies any psychotic symptoms.  Most recently has  Been taking prozac, latuda, seroquel, but states " they do not seem to be working". Of note patient is not endorsing any history of bipolar disorder, but does state that she has a long history of depression, made worse by recent stressors, such as divorce, retirement, social isolation. Also has a history of Cancer, now in remission . States she remembers Wellbutrin XL as most effective medication she has been on and does not remember having had side effects. Of note, no history of seizure disorder . Dx- MDD, without psychotic features  Plan- Admit to inpatient unit- we discussed treatment options. Agreed to Wellbutrin XL trial- will start at 150 mgrs QDAY, will also start Remeron 15 mgrs QHS . Ativan 0.5 mgrs Q 6 hours PRN for Anxiety.  Continue Synthroid for history of hypothyroidism and Nexium for history of GERD .  Musculoskeletal: Strength & Muscle Tone: within normal limits Gait & Station: normal Patient leans: N/A  Psychiatric Specialty Exam: Physical Exam  ROS  Blood pressure 101/67, pulse 109, temperature 99 F (37.2 C), temperature source Oral, resp. rate 16, height '5\' 3"'$  (1.6 m), weight 110 lb (49.896 kg).Body mass index is 19.49 kg/(m^2).  General Appearance: Fairly Groomed  Eye Contact::  Good  Speech:  Normal Rate  Volume:  Normal  Mood:  Anxious and Depressed  Affect:  Appropriate and Constricted  Thought Process:  Goal Directed and Linear  Orientation:   Fully alert and  attentive   Thought Content:  ruminative about stressors, no hallucinations, no delusions  Suicidal Thoughts:  Yes.  without intent/plan- at this time contracts for safety on the unit and denies plan or intention of hurting self on unit .  Homicidal Thoughts:  No  Memory:  recent and remote grossly intact   Judgement:  Fair  Insight:  Present  Psychomotor Activity:  Decreased  Concentration:  Good  Recall:  Good  Fund of Knowledge:Good  Language: Good  Akathisia:  Negative  Handed:  Right  AIMS (if indicated):     Assets:  Communication Skills Desire for Improvement  Sleep:     Cognition: WNL  ADL's:  Fair      COGNITIVE FEATURES THAT CONTRIBUTE TO RISK:  Closed-mindedness    SUICIDE RISK:   Moderate:  Frequent suicidal ideation with limited intensity, and duration, some specificity in terms of plans, no associated intent, good self-control, limited dysphoria/symptomatology, some risk factors present, and identifiable protective factors, including available and accessible social support.  PLAN OF CARE: Patient will be admitted to inpatient psychiatric unit for stabilization and safety. Will provide and encourage milieu participation. Provide medication management and maked adjustments as needed.  Will follow daily.     I certify that inpatient services furnished can reasonably be expected to improve the patient's condition.   Montoya Brandel 10/25/2014, 2:32 PM

## 2014-10-25 NOTE — [facility] Group Notes (Signed)
[place] LCSW Group Therapy 10/25/2014 1:15 PM Type of Therapy: Group Therapy Participation Level: Active  Participation Quality: Attentive, Sharing and Supportive  Affect: Depressed and Flat  Cognitive: Alert and Oriented  Insight: Developing/Improving and Engaged  Engagement in Therapy: Developing/Improving and Engaged  Modes of Intervention: Clarification, Confrontation, Discussion, Education, Exploration, Limit-setting, Orientation, Problem-solving, Rapport Building, Art therapist, Socialization and Support  Summary of Progress/Problems: The topic for today was feelings about relapse. Pt discussed what relapse prevention is to them and identified triggers that they are on the path to relapse. Pt processed their feeling towards relapse and was able to relate to peers. Pt discussed coping skills that can be used for relapse prevention. Patient identified with another group member's experience of relapsing into depression and "shutting down" which causes her to neglect her ADL's. Patient provided emotional support to other group member who was expressing negative feelings related to her depression.   Tilden Fossa, MSW, [place] Worker [facility] [phone]

## 2014-10-25 NOTE — Progress Notes (Signed)
Pt did not attend wrap-up group   

## 2014-10-25 NOTE — Progress Notes (Signed)
Adult Psychoeducational Group Note  Date:  10/25/2014 Time:  10:00AM  Group Topic/Focus:  Making Healthy Choices:   The focus of this group is to help patients identify negative/unhealthy choices they were using prior to admission and identify positive/healthier coping strategies to replace them upon discharge.  Participation Level:  Did Not Attend  Additional Comments:  Pt did not attend group. Pt was in her room lying in the bed during group time   Shaolin Armas K 10/25/2014, 10:40 AM

## 2014-10-25 NOTE — [facility] Group Notes (Signed)
   [facility] LCSW Aftercare Discharge Planning Group Note  10/25/2014  8:45 AM   Participation Quality: Alert, Appropriate and Oriented  Mood/Affect: Depressed and Flat  Depression Rating: 7  Anxiety Rating: 7  Thoughts of Suicide: Pt denies SI/HI  Will you contract for safety? Yes  Current AVH: Pt denies  Plan for Discharge/Comments: Pt attended discharge planning group and actively participated in group. CSW provided pt with today's workbook. Patient plans to return home to follow up with her outpatient provider at [facility].  Transportation Means: Pt reports access to transportation  Supports: No supports mentioned at this time  Tilden Fossa, MSW, [place] Social Worker [employer] [phone]
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
## 2014-10-25 NOTE — Progress Notes (Signed)
D: Pt has depressed affect and mood.  Pt reports her goal was "to take a shower and just be part of the group."  Pt reports she attended groups throughout the day.  Pt reports "I feel better than I did yesterday."  Pt reports her medications were changed and this "makes me feel hopeful."  Pt denies SI/HI, denies hallucinations, denies pain.  Pt has been in her room for the majority of the evening.  Pt did not attend evening group.   A: Introduced self to pt.  Met with pt 1:1 and provided support and encouragement.  Actively listened to pt.  Medications administered per order.   R: Pt is compliant with medications.  Pt verbally contracts for safety.  Will continue to monitor and assess.
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
## 2014-10-25 NOTE — Progress Notes (Signed)
Patient ID: [name], female   DOB: 12-Nov-1950, 64 y.o.   MRN: [id number] D-To nurses station with some frequency needing reassurance re getting meds today and seeing a Dr. She continues to think about all the things she needs to be doing at home. Denies any thoughts to hurt self, and doesn't think she needs to be here. A-Continue to monitor for safety, Support offered. No medications at this are ordered, she has not yet seen the Dr. Brien Few complaints at this time other than anxiety.
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
## 2014-10-25 NOTE — Tx Team (Addendum)
Initial Interdisciplinary Treatment Plan   PATIENT STRESSORS: Marital or family conflict Occupational concerns   PATIENT STRENGTHS: Average or above average intelligence Capable of independent living Child psychotherapist Supportive family/friends Work skills   PROBLEM LIST: Problem List/Patient Goals Date to be addressed Date deferred Reason deferred Estimated date of resolution  "Would like to feel a self worth again"      Request a medication adjustment      MDD and GAD with SI thoughts                                           DISCHARGE CRITERIA:  Improved stabilization in mood, thinking, and/or behavior Motivation to continue treatment in a less acute level of care Reduction of life-threatening or endangering symptoms to within safe limits Verbal commitment to aftercare and medication compliance  PRELIMINARY DISCHARGE PLAN: Return to previous living arrangement Return to previous work or school arrangements  PATIENT/FAMIILY INVOLVEMENT: This treatment plan has been presented to and reviewed with the patient, [name], and/or family member, .  The patient and family have been given the opportunity to ask questions and make suggestions.  [name] 10/25/2014, 12:51 AM

## 2014-10-25 NOTE — Progress Notes (Signed)
Recreation Therapy Notes  Date: 06.10.16 Time: 9:30 am Location: [address] Group Room  Group Topic: Stress Management  Goal Area(s) Addresses:  Patient will verbalize importance of using healthy stress management.  Patient will identify positive emotions associated with healthy stress management.   Intervention: Stress Management  Activity :  Progressive Muscle Relaxation.  LRT introduced and educated patients on stress management technique of progressive muscle relaxation.  A script was used to deliver the technique to patients.  Patients were asked to follow script read a loud by LRT to engage in practicing the stress management technique.  Education:  Stress Management, Discharge Planning.   Clinical Observations/Feedback: Patient did not attend group.   Victorino Sparrow, LRT/CTRS         Ria Comment, Tivis Wherry A 10/25/2014 3:22 PM
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
## 2014-10-26 NOTE — BHH Group Notes (Signed)
[facility] Group Notes:  (Nursing/MHT/Case Management/Adjunct)  Date:  10/26/2014  Time:  0930  Type of Therapy:  Psychoeducational Skills  Participation Level:  Active  Participation Quality:  Appropriate, Attentive, Sharing and Supportive  Affect:  Appropriate  Cognitive:  Alert, Appropriate and Oriented  Insight:  Appropriate and Good  Engagement in Group:  Engaged and Supportive  Modes of Intervention:  Discussion, Education, Exploration, Orientation and Support  Summary of Progress/Problems: Pt engaged and was verbally interactive with others during group session.   Meryle Ready, Nicoletta Dress 10/26/2014, 0930

## 2014-10-26 NOTE — Progress Notes (Signed)
[facility] Group Notes:  (Nursing/MHT/Case Management/Adjunct)  Date:  10/26/2014  Time:  10:43 PM  Type of Therapy:  Psychoeducational Skills  Participation Level:  Active  Participation Quality:  Appropriate  Affect:  Appropriate  Cognitive:  Appropriate  Insight:  Appropriate  Engagement in Group:  Engaged  Modes of Intervention:  Education  Summary of Progress/Problems: The patient shared in group this evening that she had a good day overall. First of all, she indicated that she experienced less anxiety today. Secondly, she indicated that she had a good visit with her sons. She did however mention that she had a headache today. In terms of the theme for the day, her coping skill will be to listen to music.   Archie Balboa S 10/26/2014, 10:43 PM
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
## 2014-10-26 NOTE — Plan of Care (Signed)
Problem: Ineffective individual coping Goal: STG: Patient will remain free from self harm Outcome: Progressing Pt has made no gestures or attempts to harm self thus far this shift. Safety maintained on Q 15 minutes checks as per order and pt monitored as such without events.

## 2014-10-26 NOTE — Progress Notes (Signed)
[facility] MD Progress Note  10/26/2014 11:05 AM [name]  MRN:  [id number] Subjective:  "I did not sleep well, the Remeron was not enough" Objective:  [name] is a 64 year old female who came in to the ED with depression affected her activities of daily living. She states she has had a series of losses over recent years, to include divorce, retirement, history of cancer, now living alone spanning 8 years. States she has a long history of anxiety and depression. She also states worsening loss of appetite.   Principal Problem: Major depressive disorder, recurrent, severe without psychotic features Diagnosis:   Patient Active Problem List   Diagnosis Date Noted  . Depression [F32.9] 10/25/2014  . Major depressive disorder, recurrent, severe without psychotic features [F33.2]   . Abnormal chest x-ray [R93.8]   . Unspecified hypothyroidism [E03.9] 04/25/2013  . Unspecified vitamin D deficiency [E55.9] 04/25/2013  . 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 Elevated blood pressure reading without diagnosis of hypertension [R03.0] 10/30/2010  . Healthcare maintenance [Z00.00] 10/30/2010  . MUSCLE PAIN [M79.1, M60.9] 07/03/2008  . BIPLR I MOST RECENT EPIS DPRS SEV NO PSYCHOT BHV [F31.4] 02/14/2008  . DEEP PHLEBOTHROMBOSIS PP UNSPEC AS EPIS CARE [O87.1] 02/14/2008  . HYPERLIPIDEMIA [E78.5] 03/04/2007  . ALLERGIC RHINITIS [J30.9] 03/04/2007  . GERD [K21.9] 03/04/2007  . IRRITABLE BOWEL SYNDROME, HX OF [Z87.19] 03/04/2007   Total Time spent with patient: 30 minutes   Past Medical History:  Past Medical History  Diagnosis Date  . Deep phlebothrombosis, postpartum, unspecified as to episode of care   . Bipolar I disorder, most recent episode (or current) depressed, severe, without mention of psychotic behavior   . Other and unspecified hyperlipidemia   . IBS (irritable bowel syndrome)   . GERD (gastroesophageal reflux disease)   . Allergic rhinitis, cause unspecified   .  Hyperlipidemia   . Gastritis   . Depression   . Anxiety   . Migraine headache   . Leiomyosarcoma of uterus     vaginal wall  . Blood transfusion without reported diagnosis   . Parathyroid adenoma 2014  . Status post chemotherapy 2014  . Asthma     chronic cough  . Fibromyalgia     Past Surgical History  Procedure Laterality Date  . Cesarean section  1986  . Vaginal hysterectomy    . Pubovaginal sling    . Neck surgery  2006    C spine fusion  . Laprotomy  04/2012    for cancer  . Colonoscopy    . Cholecystectomy N/A 05/03/2013    Procedure: LAPAROSCOPIC CHOLECYSTECTOMY WITH INTRAOPERATIVE CHOLANGIOGRAM;  Surgeon: Odis Hollingshead, MD;  Location: [place];  Service: General;  Laterality: N/A;   Family History:  Family History  Problem Relation Age of Onset  . Coronary artery disease Father   . Heart disease Father   . Hypertension Father   . Irritable bowel syndrome Father   . Diabetes Maternal Grandmother   . Arthritis Mother   . Hypertension Mother   . Migraines Mother   . Heart disease Mother   . Irritable bowel syndrome Sister   . Irritable bowel syndrome Brother   . Colon cancer Neg Hx   . Esophageal cancer Neg Hx   . Rectal cancer Neg Hx   . Stomach cancer Neg Hx    Social History:  History  Alcohol Use  . 1.0 - 1.5 oz/week  . 2-3 Standard drinks or equivalent per week  Comment: rarely     History  Drug Use No    History   Social History  . Marital Status: Married    Spouse Name: N/A  . Number of Children: 2  . 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 Smoking status: Never Smoker   . Smokeless tobacco: Never Used  . Alcohol Use: 1.0 - 1.5 oz/week    2-3 Standard drinks or equivalent per week     Comment: rarely  . Drug Use: No  . Sexual Activity:    Partners: Male   Other Topics Concern  . None   Social History Narrative   Married 4 years- divorced; married 10 years-  divorced. Serially monogamous relationships. Remarried March '11. 2 sons- '82, '87. 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 (June "12). Lives in her own home   Additional History:    Sleep: Good  Appetite:  Good   Assessment:  [name], 64 year old female. She states she has had a series of losses over recent years, to include divorce, retirement, history of cancer, now living alone . States she has a long history of anxiety and depression.  Dx- MDD, without psychotic features   Musculoskeletal: Strength & Muscle Tone: within normal limits Gait & Station: normal Patient leans: N/A   Psychiatric Specialty Exam: Physical Exam  Vitals reviewed.   ROS  Blood pressure 108/66, pulse 83, temperature 97.6 F (36.4 C), temperature source Oral, resp. rate 16, height 5' 3"  (1.6 m), weight 49.896 kg (110 lb).Body mass index is 19.49 kg/(m^2).   General Appearance: Neat  Eye Contact:: Fair  Speech: Normal Rate  Volume: Normal  Mood: Anxious and Depressed  Affect: Congruent  Thought Process: Coherent  Orientation: Full (Time, Place, and Person)  Thought Content: Rumination  Suicidal Thoughts: NO  Homicidal Thoughts: No  Memory: Immediate; Fair Recent; Fair Remote; Fair  Judgement: Fair  Insight: Fair  Psychomotor Activity: Normal  Concentration: Fair  Recall: [employer] of Knowledge:Good  Language: Good  Akathisia: Negative  Handed: Right  AIMS (if indicated):    Assets: Desire for Improvement Physical Health Resilience Social Support  ADL's: Intact  Cognition: WNL  Sleep:  6.75 hrs        Current Medications: Current Facility-Administered Medications  Medication Dose Route Frequency Provider Last Rate Last Dose  . acetaminophen (TYLENOL) tablet 650 mg  650 mg Oral Q6H PRN Kerrie Buffalo, NP      . alum & mag hydroxide-simeth  (MAALOX/MYLANTA) 200-200-20 MG/5ML suspension 30 mL  30 mL Oral Q4H PRN Kerrie Buffalo, NP      . buPROPion (WELLBUTRIN XL) 24 hr tablet 150 mg  150 mg Oral Daily Kerrie Buffalo, NP   150 mg at 10/26/14 0819  . diphenoxylate-atropine (LOMOTIL) 2.5-0.025 MG per tablet 1 tablet  1 tablet Oral QID PRN Kerrie Buffalo, NP      . feeding supplement (ENSURE ENLIVE) (ENSURE ENLIVE) liquid 237 mL  237 mL Oral BID BM Maricela Bo Ostheim, RD   237 mL at 10/26/14 1000  . levothyroxine (SYNTHROID, LEVOTHROID) tablet 100 mcg  100 mcg Oral QAC breakfast Kerrie Buffalo, NP   100 mcg at 10/26/14 6789  . LORazepam (ATIVAN) tablet 0.5 mg  0.5 mg Oral Q6H PRN Kerrie Buffalo, NP   0.5 mg at 10/26/14 0824  . magnesium hydroxide (MILK OF MAGNESIA) suspension 30  mL  30 mL Oral Daily PRN Kerrie Buffalo, NP      . mirtazapine (REMERON) tablet 15 mg  15 mg Oral QHS Kerrie Buffalo, NP   15 mg at 10/25/14 2107  . ondansetron (ZOFRAN-ODT) disintegrating tablet 4 mg  4 mg Oral Q8H PRN Kerrie Buffalo, NP      . pantoprazole (PROTONIX) EC tablet 40 mg  40 mg Oral Daily Kerrie Buffalo, NP   40 mg at 10/26/14 0818  . topiramate (TOPAMAX) tablet 50 mg  50 mg Oral Daily Kerrie Buffalo, NP   50 mg at 10/26/14 0819  . traZODone (DESYREL) tablet 50 mg  50 mg Oral QHS PRN Kerrie Buffalo, NP        Lab Results:  Results for orders placed or performed during the hospital encounter of 10/24/14 (from the past 48 hour(s))  Urine rapid drug screen (hosp performed)not at [facility]     Status: Abnormal   Collection Time: 10/24/14 11:23 AM  Result Value Ref Range   Opiates NONE DETECTED NONE DETECTED   Cocaine NONE DETECTED NONE DETECTED   Benzodiazepines POSITIVE (A) NONE DETECTED   Amphetamines NONE DETECTED NONE DETECTED   Tetrahydrocannabinol NONE DETECTED NONE DETECTED   Barbiturates NONE DETECTED NONE DETECTED    Comment:        DRUG SCREEN FOR MEDICAL PURPOSES ONLY.  IF CONFIRMATION IS NEEDED FOR ANY PURPOSE, NOTIFY LAB WITHIN 5 DAYS.         LOWEST DETECTABLE LIMITS FOR URINE DRUG SCREEN Drug Class       Cutoff (ng/mL) Amphetamine      1000 Barbiturate      200 Benzodiazepine   161 Tricyclics       096 Opiates          300 Cocaine          300 THC              50   Acetaminophen level     Status: Abnormal   Collection Time: 10/24/14 11:39 AM  Result Value Ref Range   Acetaminophen (Tylenol), Serum <10 (L) 10 - 30 ug/mL    Comment:        THERAPEUTIC CONCENTRATIONS VARY SIGNIFICANTLY. A RANGE OF 10-30 ug/mL MAY BE AN EFFECTIVE CONCENTRATION FOR MANY PATIENTS. HOWEVER, SOME ARE BEST TREATED AT CONCENTRATIONS OUTSIDE THIS RANGE. ACETAMINOPHEN CONCENTRATIONS >150 ug/mL AT 4 HOURS AFTER INGESTION AND >50 ug/mL AT 12 HOURS AFTER INGESTION ARE OFTEN ASSOCIATED WITH TOXIC REACTIONS.   CBC     Status: Abnormal   Collection Time: 10/24/14 11:39 AM  Result Value Ref Range   WBC 11.6 (H) 4.0 - 10.5 K/uL   RBC 4.60 3.87 - 5.11 MIL/uL   Hemoglobin 13.7 12.0 - 15.0 g/dL   HCT 41.8 36.0 - 46.0 %   MCV 90.9 78.0 - 100.0 fL   MCH 29.8 26.0 - 34.0 pg   MCHC 32.8 30.0 - 36.0 g/dL   RDW 13.5 11.5 - 15.5 %   Platelets 323 150 - 400 K/uL  Comprehensive metabolic panel     Status: Abnormal   Collection Time: 10/24/14 11:39 AM  Result Value Ref Range   Sodium 140 135 - 145 mmol/L   Potassium 3.8 3.5 - 5.1 mmol/L   Chloride 105 101 - 111 mmol/L   CO2 24 22 - 32 mmol/L   Glucose, Bld 103 (H) 65 - 99 mg/dL   BUN 20 6 - 20 mg/dL   Creatinine, Ser 0.95 0.44 - 1.00  mg/dL   Calcium 11.3 (H) 8.9 - 10.3 mg/dL   Total Protein 8.1 6.5 - 8.1 g/dL   Albumin 4.6 3.5 - 5.0 g/dL   AST 26 15 - 41 U/L   ALT 16 14 - 54 U/L   Alkaline Phosphatase 138 (H) 38 - 126 U/L   Total Bilirubin 0.8 0.3 - 1.2 mg/dL   GFR calc non Af Amer >60 >60 mL/min   GFR calc Af Amer >60 >60 mL/min    Comment: (NOTE) The eGFR has been calculated using the CKD EPI equation. This calculation has not been validated in all clinical situations. eGFR's  persistently <60 mL/min signify possible Chronic Kidney Disease.    Anion gap 11 5 - 15  Ethanol (ETOH)     Status: None   Collection Time: 10/24/14 11:39 AM  Result Value Ref Range   Alcohol, Ethyl (B) <5 <5 mg/dL    Comment:        LOWEST DETECTABLE LIMIT FOR SERUM ALCOHOL IS 5 mg/dL FOR MEDICAL PURPOSES ONLY   Salicylate level     Status: None   Collection Time: 10/24/14 11:39 AM  Result Value Ref Range   Salicylate Lvl <5.1 2.8 - 30.0 mg/dL    Physical Findings: AIMS: Facial and Oral Movements Muscles of Facial Expression: None, normal Lips and Perioral Area: None, normal Jaw: None, normal Tongue: None, normal,Extremity Movements Upper (arms, wrists, hands, fingers): None, normal Lower (legs, knees, ankles, toes): None, normal, Trunk Movements Neck, shoulders, hips: None, normal, Overall Severity Severity of abnormal movements (highest score from questions above): None, normal Incapacitation due to abnormal movements: None, normal Patient's awareness of abnormal movements (rate only patient's report): No Awareness, Dental Status Current problems with teeth and/or dentures?: No Does patient usually wear dentures?: No  CIWA:    COWS:     Treatment Plan Summary: Review of chart, vital signs, medications, and notes.  1-Individual and group therapy  2-Medication management for depression and anxiety: Medications reviewed with the patient and she stated no untoward effects, unchanged.  Discussed with patient that Trazodone ordered for PRN in case Remeron is insufficient fo sleep. 3-Coping skills for depression, anxiety  4-Continue crisis stabilization and management  5-Address health issues--monitoring vital signs, stable.  Nutrition consult 6-Treatment plan in progress to prevent relapse of depression and anxiety   Medical Decision Making:  Review of Psycho-Social Stressors (1), Discuss test with performing physician (1), Decision to obtain old records (1) and Review of  Medication Regimen & Side Effects (2)  Freda Munro May Mckinzee Spirito AGNP-BC 10/26/2014, 11:05 AM

## 2014-10-26 NOTE — Progress Notes (Signed)
D:Pt presents with flat /sad affect and depressed mood, alert and oriented X 4. Cooperative with unit routines thus far this shift. Attended scheduled groups. Pt reported being worried and anxious on initial contact because she has not contacted her friends in [place] and [place] since her admission to [facility] on Thursday because their numbers are in her phone and per pt "they support me a lot, they call me everyday and I don't want them think something terrible happened to me". Pt reported her depression 5/10, hopelessness 5/10 and anxiety level 5/10 on self inventory sheet. Pt's goal for today is "To feel stronger".  A: Verbal education done on ordered medications prior to administration including PRN Ativan for anxiety and PRN Tylenol for headache. Emotional support and availability offered. Writer did assist pt to obtain friends numbers from her phone in her assigned locker with charge nurse approval. Safety maintained on Q 15 minutes checks as ordered on and off unit.  R: Pt denied SI, HI and AVH. PRN Tylenol given for c/o headache 7/10 and pt reported pain level 0/10 when reassessed. Remains compliant with current treatment regimen. Will continue to monitor and report changes in present status as it occurs.

## 2014-10-27 LAB — URINALYSIS, ROUTINE W REFLEX MICROSCOPIC
Bilirubin Urine: NEGATIVE
Glucose, UA: NEGATIVE mg/dL
Ketones, ur: NEGATIVE mg/dL
Leukocytes, UA: NEGATIVE
Nitrite: NEGATIVE
Protein, ur: NEGATIVE mg/dL
Specific Gravity, Urine: 1.004 — ABNORMAL LOW (ref 1.005–1.030)
Urobilinogen, UA: 0.2 mg/dL (ref 0.0–1.0)
pH: 6.5 (ref 5.0–8.0)

## 2014-10-27 LAB — URINE MICROSCOPIC-ADD ON

## 2014-10-27 MED ORDER — CIPROFLOXACIN HCL 500 MG PO TABS
500.0000 mg | ORAL_TABLET | Freq: Two times a day (BID) | ORAL | Status: DC
  Administered 2014-10-27 – 2014-10-28 (×3): 500 mg via ORAL
  Filled 2014-10-27 (×3): qty 1
  Filled 2014-10-27: qty 3
  Filled 2014-10-27 (×2): qty 1

## 2014-10-27 MED ORDER — CIPROFLOXACIN HCL 500 MG PO TABS: 500.0000 mg | ORAL_TABLET | Freq: Two times a day (BID) | ORAL | Status: DC

## 2014-10-27 NOTE — Progress Notes (Signed)
Patient ID: [name], female   DOB: 18-Nov-1950, 64 y.o.   MRN: [id number] [facility] MD Progress Note  10/27/2014 9:40 AM BRYCELYNN STAMPLEY  MRN:  [id number] Subjective:  "After breakfast, I was did not feel good.  I wanted to throw up.  I also have a UTI" Objective:  [name] is a 64 year old female who came in to the ED with depression affected her activities of daily living. She states she has had a series of losses over recent years, to include divorce, retirement, history of cancer, now living alone spanning 8 years. States she has a long history of anxiety and depression. She also states worsening loss of appetite.   Today, she states that after breakfast, she did not feel good and was nauseated.  Furthermore, she c/o burning upon urination and thinks she has a UTI.  Principal Problem: Major depressive disorder, recurrent, severe without psychotic features Diagnosis:   Patient Active Problem List   Diagnosis Date Noted  . Depression [F32.9] 10/25/2014  . Major depressive disorder, recurrent, severe without psychotic features [F33.2]   . Abnormal chest x-ray [R93.8]   . Unspecified hypothyroidism [E03.9] 04/25/2013  . Unspecified vitamin D deficiency [E55.9] 04/25/2013  . 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 Elevated blood pressure reading without diagnosis of hypertension [R03.0] 10/30/2010  . Healthcare maintenance [Z00.00] 10/30/2010  . MUSCLE PAIN [M79.1, M60.9] 07/03/2008  . BIPLR I MOST RECENT EPIS DPRS SEV NO PSYCHOT BHV [F31.4] 02/14/2008  . DEEP PHLEBOTHROMBOSIS PP UNSPEC AS EPIS CARE [O87.1] 02/14/2008  . HYPERLIPIDEMIA [E78.5] 03/04/2007  . ALLERGIC RHINITIS [J30.9] 03/04/2007  . GERD [K21.9] 03/04/2007  . IRRITABLE BOWEL SYNDROME, HX OF [Z87.19] 03/04/2007   Total Time spent with patient: 30 minutes   Past Medical History:  Past Medical History  Diagnosis Date  . Deep phlebothrombosis, postpartum, unspecified as to episode of care   . Bipolar I disorder,  most recent episode (or current) depressed, severe, without mention of psychotic behavior   . Other and unspecified hyperlipidemia   . IBS (irritable bowel syndrome)   . GERD (gastroesophageal reflux disease)   . Allergic rhinitis, cause unspecified   . Hyperlipidemia   . Gastritis   . Depression   . Anxiety   . Migraine headache   . Leiomyosarcoma of uterus     vaginal wall  . Blood transfusion without reported diagnosis   . Parathyroid adenoma 2014  . Status post chemotherapy 2014  . Asthma     chronic cough  . Fibromyalgia     Past Surgical History  Procedure Laterality Date  . Cesarean section  1986  . Vaginal hysterectomy    . Pubovaginal sling    . Neck surgery  2006    C spine fusion  . Laprotomy  04/2012    for cancer  . Colonoscopy    . Cholecystectomy N/A 05/03/2013    Procedure: LAPAROSCOPIC CHOLECYSTECTOMY WITH INTRAOPERATIVE CHOLANGIOGRAM;  Surgeon: Odis Hollingshead, MD;  Location: [place];  Service: General;  Laterality: N/A;   Family History:  Family History  Problem Relation Age of Onset  . Coronary artery disease Father   . Heart disease Father   . Hypertension Father   . Irritable bowel syndrome Father   . Diabetes Maternal Grandmother   . Arthritis Mother   . Hypertension Mother   . Migraines Mother   . Heart disease Mother   . Irritable bowel syndrome Sister   . Irritable bowel syndrome Brother   .  Colon cancer Neg Hx   . Esophageal cancer Neg Hx   . Rectal cancer Neg Hx   . Stomach cancer Neg Hx    Social History:  History  Alcohol Use  . 1.0 - 1.5 oz/week  . 2-3 Standard drinks or equivalent per week    Comment: rarely     History  Drug Use No    History   Social History  . Marital Status: Married    Spouse Name: N/A  . Number of Children: 2  . 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 Smoking status: Never Smoker   . Smokeless tobacco: Never Used  . Alcohol  Use: 1.0 - 1.5 oz/week    2-3 Standard drinks or equivalent per week     Comment: rarely  . Drug Use: No  . Sexual Activity:    Partners: Male   Other Topics Concern  . None   Social History Narrative   Married 4 years- divorced; married 10 years- divorced. Serially monogamous relationships. Remarried March '11. 2 sons- '82, '87. 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 (June "12). Lives in her own home   Additional History:    Sleep: Good  Appetite:  Good   Assessment:  [name], 64 year old female. She states she has had a series of losses over recent years, to include divorce, retirement, history of cancer, now living alone . States she has a long history of anxiety and depression.  Dx- MDD, without psychotic features   Musculoskeletal: Strength & Muscle Tone: within normal limits Gait & Station: normal Patient leans: N/A   Psychiatric Specialty Exam: Physical Exam  Vitals reviewed.   ROS  Blood pressure 114/81, pulse 77, temperature 99.1 F (37.3 C), temperature source Oral, resp. rate 16, height '5\' 3"'$  (1.6 m), weight 49.896 kg (110 lb).Body mass index is 19.49 kg/(m^2).   General Appearance: Neat  Eye Contact:: Fair  Speech: Normal Rate  Volume: Normal  Mood: Anxious and Depressed  Affect: Congruent  Thought Process: Coherent  Orientation: Full (Time, Place, and Person)  Thought Content: Rumination  Suicidal Thoughts: NO  Homicidal Thoughts: No  Memory: Immediate; Fair Recent; Fair Remote; Fair  Judgement: Fair  Insight: Fair  Psychomotor Activity: Normal  Concentration: Fair  Recall: [employer] of Knowledge:Good  Language: Good  Akathisia: Negative  Handed: Right  AIMS (if indicated):    Assets: Desire for Improvement Physical Health Resilience Social Support  ADL's: Intact  Cognition: WNL  Sleep:  6.75 hrs         Current Medications: Current Facility-Administered Medications  Medication Dose Route Frequency Provider Last Rate Last Dose  . acetaminophen (TYLENOL) tablet 650 mg  650 mg Oral Q6H PRN Kerrie Buffalo, NP   650 mg at 10/27/14 0814  . alum & mag hydroxide-simeth (MAALOX/MYLANTA) 200-200-20 MG/5ML suspension 30 mL  30 mL Oral Q4H PRN Kerrie Buffalo, NP      . buPROPion (WELLBUTRIN XL) 24 hr tablet 150 mg  150 mg Oral Daily Kerrie Buffalo, NP   150 mg at 10/27/14 0814  . ciprofloxacin (CIPRO) tablet 500 mg  500 mg Oral BID Kerrie Buffalo, NP      . diphenoxylate-atropine (LOMOTIL) 2.5-0.025 MG per tablet 1 tablet  1 tablet Oral QID PRN Kerrie Buffalo, NP   1 tablet at 10/27/14 8242  . feeding supplement (ENSURE ENLIVE) (  ENSURE ENLIVE) liquid 237 mL  237 mL Oral BID BM Maricela Bo Ostheim, RD   237 mL at 10/26/14 1439  . levothyroxine (SYNTHROID, LEVOTHROID) tablet 100 mcg  100 mcg Oral QAC breakfast Kerrie Buffalo, NP   100 mcg at 10/27/14 [phone]  . LORazepam (ATIVAN) tablet 0.5 mg  0.5 mg Oral Q6H PRN Kerrie Buffalo, NP   0.5 mg at 10/27/14 4818  . magnesium hydroxide (MILK OF MAGNESIA) suspension 30 mL  30 mL Oral Daily PRN Kerrie Buffalo, NP      . mirtazapine (REMERON) tablet 15 mg  15 mg Oral QHS Kerrie Buffalo, NP   15 mg at 10/26/14 2108  . ondansetron (ZOFRAN-ODT) disintegrating tablet 4 mg  4 mg Oral Q8H PRN Kerrie Buffalo, NP   4 mg at 10/27/14 5909  . pantoprazole (PROTONIX) EC tablet 40 mg  40 mg Oral Daily Kerrie Buffalo, NP   40 mg at 10/27/14 3112  . topiramate (TOPAMAX) tablet 50 mg  50 mg Oral Daily Kerrie Buffalo, NP   50 mg at 10/27/14 0814  . traZODone (DESYREL) tablet 50 mg  50 mg Oral QHS PRN Kerrie Buffalo, NP        Lab Results:  No results found for this or any previous visit (from the past 48 hour(s)).  Physical Findings: AIMS: Facial and Oral Movements Muscles of Facial Expression: None, normal Lips and Perioral Area: None, normal Jaw: None, normal Tongue: None,  normal,Extremity Movements Upper (arms, wrists, hands, fingers): None, normal Lower (legs, knees, ankles, toes): None, normal, Trunk Movements Neck, shoulders, hips: None, normal, Overall Severity Severity of abnormal movements (highest score from questions above): None, normal Incapacitation due to abnormal movements: None, normal Patient's awareness of abnormal movements (rate only patient's report): No Awareness, Dental Status Current problems with teeth and/or dentures?: No Does patient usually wear dentures?: No  CIWA:    COWS:     Treatment Plan Summary: Review of chart, vital signs, medications, and notes.  1-Individual and group therapy  2-Medication management for depression and anxiety: Medications reviewed with the patient and she stated no untoward effects, unchanged.  Discussed with patient that Trazodone ordered for PRN in case Remeron is insufficient fo sleep.   UTI-Cipro 500 mg BID for 3 days, UA pending 3-Coping skills for depression, anxiety  4-Continue crisis stabilization and management  5-Address health issues--monitoring vital signs, stable.  Nutrition consult 6-Treatment plan in progress to prevent relapse of depression and anxiety   Medical Decision Making:  Review of Psycho-Social Stressors (1), Discuss test with performing physician (1), Decision to obtain old records (1) and Review of Medication Regimen & Side Effects (2)  Freda Munro May Pankaj Haack AGNP-BC 10/27/2014, 9:40 AM

## 2014-10-27 NOTE — Progress Notes (Signed)
[facility] Group Notes:  (Nursing/MHT/Case Management/Adjunct)  Date:  10/27/2014  Time:  11:10 PM  Type of Therapy:  Psychoeducational Skills  Participation Level:  Active  Participation Quality:  Appropriate  Affect:  Blunted and Excited  Cognitive:  Appropriate  Insight:  Appropriate  Engagement in Group:  Engaged  Modes of Intervention:  Education  Summary of Progress/Problems: The patient shared with the group that she had a rough morning as she felt nauseated and weak. She felt better in the afternoon as her physical symptoms abated. She briefly touched on the subject of possibly being discharged tomorrow. In terms of the theme for the day, her support system consists of her parents, son, and her church.   Saphyra Hutt S 10/27/2014, 11:10 PM
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
## 2014-10-27 NOTE — Progress Notes (Addendum)
Nutrition Brief Note  RD consulted for nutritional assessment for pt who is a cancer survivor and s/p chemo.   Pt was initially assessed by an RD on 6/10. Interventions have been put into place. Pt has been ordered Ensure Enlive BID.  Labs and medications reviewed.   If other nutrition issues arise, please consult RD.   Clayton Bibles, MS, RD, LDN Pager: [phone] After Hours Pager: [phone]

## 2014-10-27 NOTE — Progress Notes (Signed)
D: Pt has anxious affect and pleasant mood.  Pt reports her day was "okay" but that she was anxious this morning.  She reports she "took a long nap and felt better, tonight I feel fine."  Pt reports she feels safe to discharge tomorrow.  Pt reported she is "interested in outpatient here.  I'm going to continue to see my therapist, Dr. Cheryln Manly."  Pt denies SI/HI, denies hallucinations, denies pain.  Pt has been visible in milieu interacting with peers and staff appropriately.  Pt attended evening group.   A: Introduced self to pt.  Met with pt 1:1 and provided support and encouragement.  Actively listened to pt.  Medications administered per order.  R: Pt is compliant with medications.  Pt verbally contracts for safety.  Will continue to monitor and assess.

## 2014-10-27 NOTE — Progress Notes (Signed)
Patient ID: [name], female   DOB: 07-15-1950, 63 y.o.   MRN: [id number] D: Patient continues to complain of severe anxiety.  Received ativan this am and requested another dose before it was due.  She states she does not feel well.  She is having burning during urination and is certain she has a UTI.  Cipro was ordered and a urinalysis.  She rates her depression as a 5; hopelessness as a 5 and anxiety as a 6.  She is having problems with IBS and nausea.  Her goal today is to "help develop a discharge plan."  Patient is pleasant and cooperative.  She presents with flat affect and depressed mood.  She states anxiety is her main problem.  She denies SI/HI/AVH. A: Continue to monitor medication management and MD orders.  Safety checks completed every 15 minutes per protocol. R: Patient has tendency to isolate to room.  She is currently up in the milieu interacting with her peers.

## 2014-10-27 NOTE — Plan of Care (Signed)
Problem: Alteration in mood Goal: LTG-Patient reports reduction in suicidal thoughts (Patient reports reduction in suicidal thoughts and is able to verbalize a safety plan for whenever patient is feeling suicidal)  Outcome: Progressing Pt denies SI this shift and verbally contracts for safety.    Problem: Diagnosis: Increased Risk For Suicide Attempt Goal: STG-Patient Will Attend All Groups On The Unit Outcome: Progressing Pt attended evening group on 10/26/14. Goal: STG-Patient Will Comply With Medication Regime Outcome: Progressing Pt has been compliant with medications this shift.

## 2014-10-27 NOTE — BHH Group Notes (Signed)
[facility] Group Notes:  (Nursing/MHT/Case Management/Adjunct)  Date:  10/27/2014  Time: 0900 am  Type of Therapy:  Psychoeducational Skills  Participation Level:  Did Not Attend   [name] 10/27/2014, 2:49 PM

## 2014-10-27 NOTE — BHH Group Notes (Signed)
[facility] Group Notes:  (Clinical Social Work)  10/27/2014  1:15-2:15PM  Summary of Progress/Problems:   The main focus of today's process group was to   1)  discuss the importance of adding supports  2)  identify potential healthy supports and   3)  plan what to add  An emphasis was placed on using counselor, doctor, therapy groups, 12-step groups, and problem-specific support groups to expand supports.    The patient expressed full comprehension of the concepts presented, and agreed that there is a need to add more supports.  The patient stated she learned from her oncologist that she had to create a "new normal" for herself after cancer and that is what she needs to do now again.  She was encouraging and helpful to others with advice, care, and sharing images of a river going forward, never backward.  Type of Therapy:  Process Group with Motivational Interviewing  Participation Level:  Active  Participation Quality:  Attentive, Sharing and Supportive  Affect:  Blunted and Depressed  Cognitive:  Alert, Appropriate and Oriented  Insight:  Engaged  Engagement in Therapy:  Engaged  Modes of Intervention:   Education, Support and Processing, Activity  Selmer Dominion, LCSW 10/27/2014

## 2014-10-28 ENCOUNTER — Ambulatory Visit (INDEPENDENT_AMBULATORY_CARE_PROVIDER_SITE_OTHER): Payer: BLUE CROSS/BLUE SHIELD | Admitting: Neurology

## 2014-10-28 ENCOUNTER — Encounter: Payer: Self-pay | Admitting: Neurology

## 2014-10-28 VITALS — BP 118/68 | HR 77 | Ht 62.75 in | Wt 111.2 lb

## 2014-10-28 DIAGNOSIS — F0781 Postconcussional syndrome: Secondary | ICD-10-CM | POA: Insufficient documentation

## 2014-10-28 HISTORY — DX: Postconcussional syndrome: F07.81

## 2014-10-28 MED ORDER — TRAZODONE HCL 50 MG PO TABS: 50.0000 mg | ORAL_TABLET | Freq: Every evening | ORAL | Status: DC | PRN

## 2014-10-28 MED ORDER — LEVOTHYROXINE SODIUM 100 MCG PO TABS: 100.0000 ug | ORAL_TABLET | Freq: Every day | ORAL | Status: DC

## 2014-10-28 MED ORDER — PROGESTERONE MICRONIZED 100 MG PO CAPS: 100.0000 mg | ORAL_CAPSULE | ORAL | Status: DC

## 2014-10-28 MED ORDER — TOPIRAMATE 50 MG PO TABS: 25.0000 mg | ORAL_TABLET | Freq: Every day | ORAL | Status: DC

## 2014-10-28 MED ORDER — MULTI-VITAMIN/MINERALS PO TABS: 1.0000 | ORAL_TABLET | Freq: Every day | ORAL | Status: DC

## 2014-10-28 MED ORDER — ONDANSETRON HCL 4 MG PO TABS: 4.0000 mg | ORAL_TABLET | Freq: Three times a day (TID) | ORAL | Status: DC | PRN

## 2014-10-28 MED ORDER — ESOMEPRAZOLE MAGNESIUM 40 MG PO CPDR: 40.0000 mg | DELAYED_RELEASE_CAPSULE | Freq: Two times a day (BID) | ORAL | Status: DC

## 2014-10-28 MED ORDER — LEVOTHYROXINE SODIUM 75 MCG PO TABS: 100.0000 ug | ORAL_TABLET | Freq: Every day | ORAL | Status: DC

## 2014-10-28 MED ORDER — VITAMIN D (ERGOCALCIFEROL) 1.25 MG (50000 UNIT) PO CAPS: 50000.0000 [IU] | ORAL_CAPSULE | ORAL | Status: DC

## 2014-10-28 MED ORDER — ESTRADIOL 1 MG PO TABS: 1.0000 mg | ORAL_TABLET | Freq: Every day | ORAL | Status: DC

## 2014-10-28 MED ORDER — MIRTAZAPINE 15 MG PO TABS: 15.0000 mg | ORAL_TABLET | Freq: Every day | ORAL | Status: DC

## 2014-10-28 MED ORDER — CIPROFLOXACIN HCL 500 MG PO TABS: 500.0000 mg | ORAL_TABLET | Freq: Two times a day (BID) | ORAL | Status: DC

## 2014-10-28 MED ORDER — BUPROPION HCL ER (XL) 150 MG PO TB24: 150.0000 mg | ORAL_TABLET | Freq: Every day | ORAL | Status: DC

## 2014-10-28 MED ORDER — TOPIRAMATE 100 MG PO TABS: 50.0000 mg | ORAL_TABLET | Freq: Every day | ORAL | Status: DC

## 2014-10-28 NOTE — BHH Group Notes (Signed)
[facility] LCSW Aftercare Discharge Planning Group Note   10/28/2014 10:40 AM    Participation Quality:  Appropraite  Mood/Affect:  Appropriate  Depression Rating:  5  Anxiety Rating:  5  Thoughts of Suicide:  No  Will you contract for safety?   NA  Current AVH:  No  Plan for Discharge/Comments:  Patient attended discharge planning group and actively participated in group. Patient reports being better and ready to discharge home today. She asked to be [facility]  She advised of having follow up with current psychiatrist this week.  Suicide prevention education reviewed and SPE document provided.   Transportation Means: Patient has transportation.   Supports:  Patient has a support system.   Larenda Reedy, Eulas Post

## 2014-10-28 NOTE — Patient Instructions (Addendum)
1.  Increase topamax to '75mg'$  daily 2.  Emphasized self-health, positive thinking, and mindfulness.  Start yoga and stay active! 3.  Return to clinic in 2-3 months

## 2014-10-28 NOTE — Progress Notes (Signed)
Pt approached writer at medication window and reported feeling nauseous all of a sudden.  Then pt stated "oh I almost blacked out."  Pt told to sit down.  Writer walked with pt to her room.  PRN medications administered for headache, anxiety, nausea.  BP sitting was 121/71, pulse 66.  Pt encouraged to rest.  Tray being brought back from cafeteria.  Pt is currently safe, resting in her room.  Denies needs at this time.  Will report to oncoming RN.

## 2014-10-28 NOTE — Progress Notes (Signed)
D: Patient is alert and oriented. Pt's mood and affect is depressed and blunted, pleasant upon interaction. Pt denies SI/HI and AVH. Pt rates depression and anxiety 5/10, hopelessness 4/10. Pt reports she felt nauseous, "faint," and had a sudden onset of headache this morning in the cafeteria and therefore came back to the unit, pt reports after taking PRN medications and resting that she feels better. Pt complains of slight on-going headache. Pt reports she is ready to D/C from [facility] today and plans to attend her follow up appointments. A: Active listening by RN. Encouragement/Support provided to pt. Fall precautions reviewed with pt. Medication education reviewed with pt. Scheduled medications administered per providers orders (See MAR). 15 minute checks continued per protocol for patient safety.  R: Patient cooperative and receptive to nursing interventions. Pt remains safe.

## 2014-10-28 NOTE — Progress Notes (Signed)
  [facility] Adult Case Management Discharge Plan :  Will you be returning to the same living situation after discharge:  Yes,  Patient is returning to her home. At discharge, do you have transportation home?: Yes,  Patient will arrange transportation home. Do you have the ability to pay for your medications: Yes,  Patient is able to obtain medications.  Release of information consent forms completed and in the chart;  Patient's signature needed at discharge.  Patient to Follow up at: Follow-up Information    Follow up with [facility] On 10/31/2014.   Why:  Samul Dada on Thursday, October 31, 2014 at 11 AM   Contact information:   [address] [place], [place] [address] Phone: [phone] Fax: [phone]      Follow up with [facility] On 11/06/2014.   Why:  You are scheduled with Dr. Cheryln Manly on Wednesday, November 06, 2014 at 10 AM   Contact information:   [address] [place], [place] [address] [phone]      Follow up with Dr. Darleene Cleaver - [facility] On 11/12/2014.   Why:  You are scheduled with Dr. Darleene Cleaver on Tuesday, November 12, 2014 at [facility] information:   [place], [place]   [address]  [phone]      Patient denies SI/HI: Patient no longer endorsing SI/HI or other thoughts of self harm.     Safety Planning and Suicide Prevention discussed:  .Reviewed with all patients during discharge planning group   Have you used any form of tobacco in the last 30 days? (Cigarettes, Smokeless Tobacco, Cigars, and/or Pipes): No  Has patient been referred to the Quitline?:  No referral needed to quitline.   [name] 10/28/2014, 10:35 AM

## 2014-10-28 NOTE — Progress Notes (Signed)
DISCHARGE NOTE: D: Patient was alert, oriented, in stable condition, and ambulatory with a steady gait upon discharge. Pt denies SI/HI and AVH. Pt reports her car is parked outside of [facility], pt has her car keys upon discharge. A: AVS reviewed and given to pt. Follow up reviewed with pt. Resources reviewed with pt, including [facility]. Prescriptions/Medications given to pt. Belongings returned to pt. Pt given time to ask questions and express concerns. Pt encouraged to follow up with PCP as needed for medical concerns. R: Pt D/C'd to parking lot.

## 2014-10-28 NOTE — BHH Suicide Risk Assessment (Signed)
[facility] Discharge Suicide Risk Assessment   Demographic Factors:  64 year old female, lives alone , retired .  Total Time spent with patient: 30 minutes  Musculoskeletal: Strength & Muscle Tone: within normal limits Gait & Station: normal Patient leans: N/A  Psychiatric Specialty Exam: Physical Exam  ROS  Blood pressure 121/71, pulse 66, temperature 97.6 F (36.4 C), temperature source Oral, resp. rate 16, height '5\' 3"'$  (1.6 m), weight 110 lb (49.896 kg).Body mass index is 19.49 kg/(m^2).  General Appearance: Well Groomed  Eye Contact::  Good  Speech:  Normal Rate409  Volume:  Normal  Mood:  states mood is improved , states she feels less depressed   Affect:  more reactive, brighter affect, somewhat anxious  Thought Process:  Goal Directed and Linear  Orientation:  Full (Time, Place, and Person)  Thought Content:  no hallucinations, no delusions  Suicidal Thoughts:  No- at this time denies any thoughts of wanting to hurt herself or anyone else   Homicidal Thoughts:  No  Memory:  recent and remote grossly intact   Judgement:  Other:  improved  Insight:  Present  Psychomotor Activity:  Normal  Concentration:  Good  Recall:  Good  Fund of Knowledge:Good  Language: NA  Akathisia:  Negative  Handed:  Right  AIMS (if indicated):     Assets:  Communication Skills Desire for Improvement Resilience  Sleep:  Number of Hours: 6.75  Cognition: WNL  ADL's: improved    Have you used any form of tobacco in the last 30 days? (Cigarettes, Smokeless Tobacco, Cigars, and/or Pipes): No  Has this patient used any form of tobacco in the last 30 days? (Cigarettes, Smokeless Tobacco, Cigars, and/or Pipes) No  Mental Status Per Nursing Assessment::   On Admission:  Self-harm thoughts  Current Mental Status by Physician: At this time patient is improved compared to admission- mood is partially but significantly improved, with a fuller range of affect, no thought disorder, not suicidal , not  homicidal, no psychotic symptoms and future oriented, looking forward to returning home to her regular activities   Loss Factors: Divorce, retirement,history of cancer   Historical Factors: history of depression  Risk Reduction Factors:   Sense of responsibility to family, Positive social support and Positive coping skills or problem solving skills  Continued Clinical Symptoms:  At this time patient's mood is partially but significantly improved, there are no suicidal or homicidal ideations, no psychotic symptoms, future oriented. Of note, currently on Cipro for UTI symptoms, which have improved /resolved since antibiotic was started. No side effects from Cipro thus far.   Cognitive Features That Contribute To Risk:  No gross cognitive deficits noted upon discharge. Is alert , attentive, and oriented x 3   Suicide Risk:  Mild  Principal Problem: Major depressive disorder, recurrent, severe without psychotic features Discharge Diagnoses:  Patient Active Problem List   Diagnosis Date Noted  . Depression [F32.9] 10/25/2014  . Major depressive disorder, recurrent, severe without psychotic features [F33.2]   . Abnormal chest x-ray [R93.8]   . Unspecified hypothyroidism [E03.9] 04/25/2013  . Unspecified vitamin D deficiency [E55.9] 04/25/2013  . 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 Elevated blood pressure reading without diagnosis of hypertension [R03.0] 10/30/2010  . Healthcare maintenance [Z00.00] 10/30/2010  . MUSCLE PAIN [M79.1, M60.9] 07/03/2008  . BIPLR I MOST RECENT EPIS DPRS SEV NO PSYCHOT BHV [F31.4] 02/14/2008  . DEEP PHLEBOTHROMBOSIS PP UNSPEC AS EPIS CARE [O87.1] 02/14/2008  . HYPERLIPIDEMIA [E78.5] 03/04/2007  . ALLERGIC  RHINITIS [J30.9] 03/04/2007  . GERD [K21.9] 03/04/2007  . IRRITABLE BOWEL SYNDROME, HX OF [Z87.19] 03/04/2007    Follow-up Information    Follow up with [facility].   Why:  Needs medication management appointment prior to d/c    Contact information:   [address] [place], [place] [address] Phone: [phone] Fax: [phone]      Follow up with [facility].   Why:  Needs appt with Dr. Cheryln Manly prior to d/c   Contact information:   [place], [place], [place] [address] [phone]      Plan Of Care/Follow-up recommendations:  Activity:  as tolerated Diet:  Regular Tests:  NA Other:  see below   Is patient on multiple antipsychotic therapies at discharge:  No   Has Patient had three or more failed trials of antipsychotic monotherapy by history:  No  Recommended Plan for Multiple Antipsychotic Therapies: NA  Patient is requesting discharge and there are no grounds for involuntary commitment at this time. She is leaving unit in good spirits. Plans to return home. In addition to follow up as above, also has an outpatient Neurology appointment later today with Dr. Posey Pronto at [facility]. Also has an Materials engineer at [employer], for ongoing management , monitoring of her history of CA .    Garlan Drewes 10/28/2014, 9:21 AM
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
## 2014-10-28 NOTE — Progress Notes (Signed)
Note sent
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
## 2014-10-28 NOTE — Progress Notes (Signed)
Recreation Therapy Notes  Date: 06.13.16 Time: 9:30 am Location: [address] Group Room  Group Topic: Stress Management  Goal Area(s) Addresses:  Patient will verbalize importance of using healthy stress management.  Patient will identify positive emotions associated with healthy stress management.   Intervention: Stress Management  Activity : Guided Imagery. LRT introduced and educated patients on stress management technique of guided imagery. A script was used to deliver the techniques to patients. Patients were asked to follow script real aloud by LRT to engage in practicing guided imagery.  Education: Stress Management, Discharge Planning.   Clinical Observations/Feedback: Patient did not attend group.    Victorino Sparrow, LRT/CTRS        Ria Comment, Bassy Fetterly A 10/28/2014 1:28 PM

## 2014-10-28 NOTE — [facility] Suicide Risk Assessment (Signed)
[facility] INPATIENT:  Family/Significant Other Suicide Prevention Education  Suicide Prevention Education:  Patient Refusal for Family/Significant Other Suicide Prevention Education: The patient [name] has refused to provide written consent for family/significant other to be provided Family/Significant Other Suicide Prevention Education during admission and/or prior to discharge.  Physician notified.  Concha Pyo 10/28/2014, 10:43 AM

## 2014-11-06 ENCOUNTER — Ambulatory Visit (INDEPENDENT_AMBULATORY_CARE_PROVIDER_SITE_OTHER): Payer: BLUE CROSS/BLUE SHIELD | Admitting: Psychology

## 2014-11-06 DIAGNOSIS — F4321 Adjustment disorder with depressed mood: Secondary | ICD-10-CM | POA: Diagnosis not present

## 2014-11-13 ENCOUNTER — Ambulatory Visit (INDEPENDENT_AMBULATORY_CARE_PROVIDER_SITE_OTHER): Payer: BLUE CROSS/BLUE SHIELD | Admitting: Psychology

## 2014-11-13 DIAGNOSIS — F4321 Adjustment disorder with depressed mood: Secondary | ICD-10-CM

## 2014-11-25 ENCOUNTER — Ambulatory Visit (INDEPENDENT_AMBULATORY_CARE_PROVIDER_SITE_OTHER): Payer: BLUE CROSS/BLUE SHIELD | Admitting: Psychology

## 2014-11-25 DIAGNOSIS — F4321 Adjustment disorder with depressed mood: Secondary | ICD-10-CM

## 2014-11-27 ENCOUNTER — Telehealth: Payer: Self-pay | Admitting: Internal Medicine

## 2014-11-27 ENCOUNTER — Ambulatory Visit (INDEPENDENT_AMBULATORY_CARE_PROVIDER_SITE_OTHER): Payer: BLUE CROSS/BLUE SHIELD | Admitting: Physician Assistant

## 2014-11-27 ENCOUNTER — Encounter: Payer: Self-pay | Admitting: Physician Assistant

## 2014-11-27 ENCOUNTER — Other Ambulatory Visit (INDEPENDENT_AMBULATORY_CARE_PROVIDER_SITE_OTHER): Payer: BLUE CROSS/BLUE SHIELD

## 2014-11-27 VITALS — BP 90/60 | HR 60 | Ht 62.75 in | Wt 112.5 lb

## 2014-11-27 DIAGNOSIS — R140 Abdominal distension (gaseous): Secondary | ICD-10-CM | POA: Diagnosis not present

## 2014-11-27 DIAGNOSIS — K589 Irritable bowel syndrome without diarrhea: Secondary | ICD-10-CM

## 2014-11-27 DIAGNOSIS — R1013 Epigastric pain: Secondary | ICD-10-CM

## 2014-11-27 DIAGNOSIS — R6881 Early satiety: Secondary | ICD-10-CM

## 2014-11-27 LAB — CBC WITH DIFFERENTIAL/PLATELET
Basophils Absolute: 0 10*3/uL (ref 0.0–0.1)
Basophils Relative: 0.6 % (ref 0.0–3.0)
Eosinophils Absolute: 0.2 10*3/uL (ref 0.0–0.7)
Eosinophils Relative: 2.2 % (ref 0.0–5.0)
HCT: 38.9 % (ref 36.0–46.0)
Hemoglobin: 13 g/dL (ref 12.0–15.0)
Lymphocytes Relative: 29.7 % (ref 12.0–46.0)
Lymphs Abs: 2.2 10*3/uL (ref 0.7–4.0)
MCHC: 33.3 g/dL (ref 30.0–36.0)
MCV: 90.2 fl (ref 78.0–100.0)
Monocytes Absolute: 0.5 10*3/uL (ref 0.1–1.0)
Monocytes Relative: 7.2 % (ref 3.0–12.0)
Neutro Abs: 4.5 10*3/uL (ref 1.4–7.7)
Neutrophils Relative %: 60.3 % (ref 43.0–77.0)
Platelets: 315 10*3/uL (ref 150.0–400.0)
RBC: 4.31 Mil/uL (ref 3.87–5.11)
RDW: 14.8 % (ref 11.5–15.5)
WBC: 7.5 10*3/uL (ref 4.0–10.5)

## 2014-11-27 MED ORDER — ESOMEPRAZOLE MAGNESIUM 40 MG PO CPDR: DELAYED_RELEASE_CAPSULE | ORAL | Status: DC

## 2014-11-27 MED ORDER — ONDANSETRON HCL 4 MG PO TABS: 4.0000 mg | ORAL_TABLET | Freq: Two times a day (BID) | ORAL | Status: DC | PRN

## 2014-11-27 MED ORDER — METRONIDAZOLE 250 MG PO TABS: 250.0000 mg | ORAL_TABLET | Freq: Three times a day (TID) | ORAL | Status: DC

## 2014-11-27 NOTE — Progress Notes (Signed)
eviewed and agree. 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  Treat GERD, IBS.

## 2014-11-27 NOTE — Progress Notes (Signed)
Patient ID: [name], female   DOB: Apr 17, 1951, 64 y.o.   MRN: [id number]     History of Present Illness: [name] is a pleasant 64 year old female known to Dr. Maurene Capes with a history of gastroesophageal reflux. 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 Patient developed post chemotherapy diarrhea and had a significant weight loss of 30 pounds. She eventually responded to treatment with Colestid, Flagyl and Bentyl. She had a laparoscopic cholecystectomy in December 2014. In September 2014 she had a colonoscopy with random biopsies that showed no evidence of colitis. She has a history of esophageal stricture from prior upper endoscopies in 2001 and 2005. She had an endoscopy in September 2014 with no stricture a 42 French Maloney dilator was passed at that time. She is here today with complaints of early satiety that she says has gotten worse over the past several months. She reports that she had a gastric emptying scan years ago at [facility] with 70% being within normal emptying at 2 hours and her emptying was 65%. She has never tried a prokinetic medication. She reports that she constantly feels queasy. She reports that she does have nausea and recently was in the emergency room with suicidal ideations. She was started on Zoloft a week ago but has not noted any improvement as of yet. She has to use Zofran on a daily basis. She also has been using Nexium on a daily basis. She feels very gassy and bloated. She is frequently constipated and uses activity and V8 fusion with Mira lax to produce a bowel movement on a daily basis. This morning she had a loose bowel movement that she says was very dark. She took a Lomotil. Several hours later she had a formed brown bowel movement but she would like her stool tested for a cold blood. She denies frequent use of non-steroidal  anti-inflammatory drugs. She does not drink alcohol and does not smoke. She has not used Pepto-Bismol. She has been eating a lot of salads and fruits.   Past Medical History  Diagnosis Date  . Deep phlebothrombosis, postpartum, unspecified as to episode of care   . Bipolar I disorder, most recent episode (or current) depressed, severe, without mention of psychotic behavior   . Other and unspecified hyperlipidemia   . IBS (irritable bowel syndrome)   . GERD (gastroesophageal reflux disease)   . Allergic rhinitis, cause unspecified   . Hyperlipidemia   . Gastritis   . Depression   . Anxiety   . Migraine headache   . Leiomyosarcoma of uterus     vaginal wall  . Blood transfusion without reported diagnosis   . Parathyroid adenoma 2014  . Status post chemotherapy 2014  . Asthma     chronic cough  . Fibromyalgia     Past Surgical History  Procedure Laterality Date  . Cesarean section  1986  . Vaginal hysterectomy    . Pubovaginal sling    . Neck surgery  2006    C spine fusion  . Laprotomy  04/2012    for cancer  . Colonoscopy    . Cholecystectomy N/A 05/03/2013    Procedure: LAPAROSCOPIC CHOLECYSTECTOMY WITH INTRAOPERATIVE CHOLANGIOGRAM;  Surgeon: Odis Hollingshead, MD;  Location: [place];  Service: General;  Laterality: N/A;   Family History  Problem Relation Age of Onset  . Coronary artery  disease Father   . Heart disease Father   . Hypertension Father   . Irritable bowel syndrome Father   . Diabetes Maternal Grandmother   . Arthritis Mother   . Hypertension Mother   . Migraines Mother   . Heart disease Mother   . Irritable bowel syndrome Sister   . Irritable bowel syndrome Brother   . Colon cancer Neg Hx   . Esophageal cancer Neg Hx   . Rectal cancer Neg Hx   . Stomach cancer Neg Hx    History  Substance Use Topics  . Smoking status: Never Smoker   . Smokeless tobacco: Never Used  . Alcohol Use: 1.2 - 1.8 oz/week    2-3 Standard drinks or equivalent per week       Comment: rarely   Current Outpatient Prescriptions  Medication Sig Dispense Refill  . ALPRAZolam (XANAX) 0.5 MG tablet Take 0.5 mg by mouth at bedtime as needed for anxiety.    . baclofen (LIORESAL) 10 MG tablet Take 10 mg by mouth as needed for muscle spasms.    Marland Kitchen buPROPion (WELLBUTRIN XL) 150 MG 24 hr tablet Take 1 tablet (150 mg total) by mouth daily. For depression. (Patient taking differently: Take 300 mg by mouth daily. For depression.) 30 tablet 0  . diphenoxylate-atropine (LOMOTIL) 2.5-0.025 MG per tablet Take 1 tablet by mouth 2 (two) times daily as needed for diarrhea or loose stools. (Patient taking differently: Take 1 tablet by mouth daily. ) 180 tablet 1  . esomeprazole (NEXIUM) 40 MG capsule Take 1 capsule (40 mg total) by mouth 2 (two) times daily before a meal. 180 capsule 1  . estradiol (ESTRACE) 1 MG tablet Take 1 tablet (1 mg total) by mouth daily. 90 tablet 3  . levothyroxine (SYNTHROID, LEVOTHROID) 100 MCG tablet Take 1 tablet (100 mcg total) by mouth daily before breakfast.    . mirtazapine (REMERON) 15 MG tablet Take 1 tablet (15 mg total) by mouth at bedtime. (Patient taking differently: Take 30 mg by mouth at bedtime. ) 30 tablet 0  . Multiple Vitamins-Minerals (MULTIVITAMIN WITH MINERALS) tablet Take 1 tablet by mouth daily.    . mupirocin ointment (BACTROBAN) 2 % APPLY SPARINGLY TO AFFECTED AREA TWICE A DAY  0  . ondansetron (ZOFRAN) 4 MG tablet Take 1 tablet (4 mg total) by mouth every 8 (eight) hours as needed for nausea. 90 tablet 1  . progesterone (PROMETRIUM) 100 MG capsule Take 1 capsule (100 mg total) by mouth 2 (two) times a week.    . SUMAtriptan (IMITREX) 6 MG/0.5ML SOLN injection Inject 6 mg into the skin every 2 (two) hours as needed for migraine or headache. May repeat in 2 hours if headache persists or recurs.    . topiramate (TOPAMAX) 100 MG tablet Take 0.5 tablets (50 mg total) by mouth daily.    . traMADol (ULTRAM) 50 MG tablet Take by mouth every 6  (six) hours as needed.    . traZODone (DESYREL) 50 MG tablet Take 1 tablet (50 mg total) by mouth at bedtime as needed for sleep. 30 tablet 0  . Vitamin D, Ergocalciferol, (DRISDOL) 50000 UNITS CAPS capsule Take 1 capsule (50,000 Units total) by mouth 2 (two) times a week. Wednesday 30 capsule   . esomeprazole (NEXIUM) 40 MG capsule Take 30 min prior to breakfast 30 capsule 6  . metroNIDAZOLE (FLAGYL) 250 MG tablet Take 1 tablet (250 mg total) by mouth 3 (three) times daily. 21 tablet 0  . ondansetron (ZOFRAN)  4 MG tablet Take 1 tablet (4 mg total) by mouth 2 (two) times daily as needed for nausea or vomiting. 60 tablet 1   No current facility-administered medications for this visit.   Allergies  Allergen Reactions  . Codeine     REACTION: Disoriented  . Dust Mite Extract Cough  . Molds & Smuts Cough  . Morphine And Related     Severe headache      Review of Systems: Gen: Denies any fever, chills, sweats, anorexia, fatigue, weakness, malaise, weight loss, and sleep disorder CV: Denies chest pain, angina, palpitations, syncope, orthopnea, PND, peripheral edema, and claudication. Resp: Denies dyspnea at rest, dyspnea with exercise, cough, sputum, wheezing, coughing up blood, and pleurisy. GI: Denies vomiting blood, jaundice, and fecal incontinence.   Denies dysphagia or odynophagia. GU : Denies urinary burning, blood in urine, urinary frequency, urinary hesitancy, nocturnal urination, and urinary incontinence. MS: Denies joint pain, limitation of movement, and swelling, stiffness, low back pain, extremity pain. Denies muscle weakness, cramps, atrophy.  Derm: Denies rash, itching, dry skin, hives, moles, warts, or unhealing ulcers.  Psych: Denies depression, anxiety, memory loss, suicidal ideation, hallucinations, paranoia, and confusion. Heme: Denies bruising, bleeding, and enlarged lymph nodes. Neuro:  Denies any headaches, dizziness, paresthesia Endo:  Denies any problems with DM,  thyroid, adrenal     Physical Exam: General: Pleasant, well developed female in no acute distress Head: Normocephalic and atraumatic Eyes:  sclerae anicteric, conjunctiva pink  Ears: Normal auditory acuity Lungs: Clear throughout to auscultation Heart: Regular rate and rhythm Abdomen: Soft, non distended, non-tender. No masses, no hepatomegaly. Normal bowel sounds Rectal: Light brown stool test heme negative Musculoskeletal: Symmetrical with no gross deformities  Extremities: No edema  Neurological: Alert oriented x 4, grossly nonfocal Psychological:  Alert and cooperative. Normal mood and affect  Assessment and Recommendations:  #1. Dyspepsia. Likely functional in nature. She will continue Nexium 40 mg by mouth every morning 30 minutes prior to breakfast. She will be scheduled for a 4 hour gastric emptying scan and if positive she may be considered for a trial of low-dose metoclopramide or domperidone. We'll empirically give her a trial of Flagyl 250 mg 3 times a day for 7 days for possible bacterial overgrowth as a cause of her dyspepsia. She will continue to use Zofran as needed.  #2. IBS. As above likely functional in nature. She will continue a high-fiber low-fat diet. Stool on today's exam was negative for occult blood. We will check a CBC for completeness sake.  She will follow up in 6 weeks.       Biran Mayberry, Deloris Ping 11/27/2014,

## 2014-11-27 NOTE — Telephone Encounter (Signed)
Patient states she had had diarrhea x 3 days. She took Lomotil and the diarrhea has stopped. She reports a black, tarry stool this AM. She had a brown stool later. Denies using Pepto bismol, iron or pain. Scheduled with Cecille Rubin Hvozdovic, PA-C at 2:45 PM today.

## 2014-11-27 NOTE — Addendum Note (Signed)
Addended byTonette Bihari on: 11/27/2014 04:18 PM   Modules accepted: Orders

## 2014-11-27 NOTE — Patient Instructions (Addendum)
Your physician has requested that you go to the basement for the following lab work before leaving today:CBC w/diff  You are schedule for the 4 hour Gastric Emptying scan at [facility] 12/18/2014 at 930am.  Please arrive 15 minutes early.  Do not eat or drink anything 6 hours prior to procedure.  We have sent the following medications to your pharmacy for you to pick up at your convenience:  nexium take 30 minutes prior to breakfast  Flagyl, take three times daily  zofran '4mg'$ , 1 twice a day as needed nausea  Please keep your follow up with Dr Olevia Perches.

## 2014-12-10 ENCOUNTER — Other Ambulatory Visit (HOSPITAL_COMMUNITY): Payer: Self-pay | Admitting: Obstetrics and Gynecology

## 2014-12-10 ENCOUNTER — Other Ambulatory Visit (HOSPITAL_COMMUNITY): Payer: Self-pay | Admitting: Behavioral Health

## 2014-12-10 DIAGNOSIS — R1030 Lower abdominal pain, unspecified: Secondary | ICD-10-CM

## 2014-12-12 ENCOUNTER — Ambulatory Visit (HOSPITAL_COMMUNITY)
Admission: RE | Admit: 2014-12-12 | Discharge: 2014-12-12 | Disposition: A | Discharge status: Home / Self Care | Payer: BLUE CROSS/BLUE SHIELD | Source: Ambulatory Visit | Attending: Obstetrics and Gynecology | Admitting: Obstetrics and Gynecology

## 2014-12-12 ENCOUNTER — Encounter (HOSPITAL_COMMUNITY): Payer: Self-pay

## 2014-12-12 DIAGNOSIS — R1032 Left lower quadrant pain: Secondary | ICD-10-CM | POA: Insufficient documentation

## 2014-12-12 DIAGNOSIS — R1030 Lower abdominal pain, unspecified: Secondary | ICD-10-CM

## 2014-12-12 LAB — POCT I-STAT CREATININE: Creatinine, Ser: 1.1 mg/dL — ABNORMAL HIGH (ref 0.44–1.00)

## 2014-12-12 MED ORDER — IOHEXOL 300 MG/ML  SOLN
100.0000 mL | Freq: Once | INTRAMUSCULAR | Status: AC | PRN
  Administered 2014-12-12: 80 mL via INTRAVENOUS

## 2014-12-17 ENCOUNTER — Ambulatory Visit (HOSPITAL_COMMUNITY): Payer: BLUE CROSS/BLUE SHIELD

## 2014-12-17 ENCOUNTER — Ambulatory Visit (INDEPENDENT_AMBULATORY_CARE_PROVIDER_SITE_OTHER): Payer: Medicare Other | Admitting: Psychology

## 2014-12-17 DIAGNOSIS — F4321 Adjustment disorder with depressed mood: Secondary | ICD-10-CM

## 2014-12-18 ENCOUNTER — Telehealth: Payer: Self-pay | Admitting: *Deleted

## 2014-12-18 DIAGNOSIS — C52 Malignant neoplasm of vagina: Secondary | ICD-10-CM | POA: Diagnosis not present

## 2014-12-18 DIAGNOSIS — F332 Major depressive disorder, recurrent severe without psychotic features: Secondary | ICD-10-CM

## 2014-12-18 DIAGNOSIS — K589 Irritable bowel syndrome without diarrhea: Secondary | ICD-10-CM | POA: Diagnosis not present

## 2014-12-18 DIAGNOSIS — R05 Cough: Secondary | ICD-10-CM | POA: Diagnosis not present

## 2014-12-18 DIAGNOSIS — Z1211 Encounter for screening for malignant neoplasm of colon: Secondary | ICD-10-CM | POA: Diagnosis not present

## 2014-12-18 DIAGNOSIS — R1084 Generalized abdominal pain: Secondary | ICD-10-CM | POA: Diagnosis not present

## 2014-12-18 DIAGNOSIS — Z90722 Acquired absence of ovaries, bilateral: Secondary | ICD-10-CM | POA: Diagnosis not present

## 2014-12-18 DIAGNOSIS — R7611 Nonspecific reaction to tuberculin skin test without active tuberculosis: Secondary | ICD-10-CM | POA: Diagnosis not present

## 2014-12-18 DIAGNOSIS — G4452 New daily persistent headache (NDPH): Secondary | ICD-10-CM | POA: Diagnosis not present

## 2014-12-18 DIAGNOSIS — Z08 Encounter for follow-up examination after completed treatment for malignant neoplasm: Secondary | ICD-10-CM | POA: Diagnosis not present

## 2014-12-18 DIAGNOSIS — Z8544 Personal history of malignant neoplasm of other female genital organs: Secondary | ICD-10-CM | POA: Diagnosis not present

## 2014-12-18 DIAGNOSIS — R911 Solitary pulmonary nodule: Secondary | ICD-10-CM | POA: Diagnosis not present

## 2014-12-18 DIAGNOSIS — F4322 Adjustment disorder with anxiety: Secondary | ICD-10-CM | POA: Diagnosis not present

## 2014-12-18 DIAGNOSIS — Z90710 Acquired absence of both cervix and uterus: Secondary | ICD-10-CM | POA: Diagnosis not present

## 2014-12-18 DIAGNOSIS — K580 Irritable bowel syndrome with diarrhea: Secondary | ICD-10-CM | POA: Diagnosis not present

## 2014-12-18 HISTORY — DX: Major depressive disorder, recurrent severe without psychotic features: F33.2

## 2014-12-18 NOTE — Telephone Encounter (Signed)
Faxed a copy of colonoscopy report to Dr. Stevenson Clinch at [phone] as per MD.

## 2014-12-24 ENCOUNTER — Encounter (HOSPITAL_COMMUNITY): Payer: Medicare Other

## 2014-12-24 DIAGNOSIS — F411 Generalized anxiety disorder: Secondary | ICD-10-CM | POA: Diagnosis not present

## 2014-12-24 DIAGNOSIS — F331 Major depressive disorder, recurrent, moderate: Secondary | ICD-10-CM | POA: Diagnosis not present

## 2014-12-30 ENCOUNTER — Ambulatory Visit (INDEPENDENT_AMBULATORY_CARE_PROVIDER_SITE_OTHER): Payer: Medicare Other | Admitting: Psychology

## 2014-12-30 ENCOUNTER — Ambulatory Visit: Payer: Self-pay | Admitting: Neurology

## 2014-12-30 DIAGNOSIS — F4321 Adjustment disorder with depressed mood: Secondary | ICD-10-CM | POA: Diagnosis not present

## 2015-01-01 ENCOUNTER — Ambulatory Visit (HOSPITAL_COMMUNITY): Payer: Medicare Other

## 2015-01-01 ENCOUNTER — Encounter: Payer: Self-pay | Admitting: Internal Medicine

## 2015-01-01 ENCOUNTER — Ambulatory Visit: Payer: Self-pay | Admitting: Internal Medicine

## 2015-01-01 ENCOUNTER — Ambulatory Visit (INDEPENDENT_AMBULATORY_CARE_PROVIDER_SITE_OTHER): Payer: Medicare Other | Admitting: Internal Medicine

## 2015-01-01 VITALS — BP 112/70 | HR 76 | Ht 62.75 in | Wt 113.0 lb

## 2015-01-01 DIAGNOSIS — R197 Diarrhea, unspecified: Secondary | ICD-10-CM

## 2015-01-01 DIAGNOSIS — C499 Malignant neoplasm of connective and soft tissue, unspecified: Secondary | ICD-10-CM | POA: Diagnosis not present

## 2015-01-01 DIAGNOSIS — R1013 Epigastric pain: Secondary | ICD-10-CM

## 2015-01-01 DIAGNOSIS — K219 Gastro-esophageal reflux disease without esophagitis: Secondary | ICD-10-CM | POA: Diagnosis not present

## 2015-01-01 MED ORDER — METRONIDAZOLE 250 MG PO TABS: 250.0000 mg | ORAL_TABLET | Freq: Three times a day (TID) | ORAL | Status: DC

## 2015-01-01 NOTE — Progress Notes (Signed)
[name] 1950/06/15 [id number]  Note: This dictation was prepared with Dragon digital system. Any transcriptional errors that result from this procedure are unintentional.   History of Present Illness: This is a 64 year old white female presents for follow up of gastroesophageal reflux and esophageal stricture which was previously evaluated and dilated in 2001, 2005 and most recently in September 2014. 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 In addition she went through  a painful divorce and became quite depressed. We have done colonoscopy for colorectal screening.in September 2014, random biopsies of the colon showed normal mucosa without evidence of microscopic colitis. The diarrhea was initially profuse but has normalized after treatment with  cholestyramine, Flagyl and anti-spasmodics. Currently she is doing well.  Her gastroesophageal reflux is under good control. Her weight has stabilized around 113 pounds.    Past Medical History  Diagnosis Date  . Deep phlebothrombosis, postpartum, unspecified as to episode of care   . Bipolar I disorder, most recent episode (or current) depressed, severe, without mention of psychotic behavior   . Other and unspecified hyperlipidemia   . IBS (irritable bowel syndrome)   . GERD (gastroesophageal reflux disease)   . Allergic rhinitis, cause unspecified   . Hyperlipidemia   . Gastritis   . Depression   . Anxiety   . Migraine headache   . Leiomyosarcoma of uterus     vaginal wall  . Blood transfusion without reported diagnosis   . Parathyroid adenoma 2014  . Status post chemotherapy 2014  . Asthma     chronic cough  . Fibromyalgia     Past Surgical History  Procedure Laterality Date  . Cesarean section  1986  . Vaginal hysterectomy    . Pubovaginal sling    . Neck surgery  2006    C spine fusion  .  Laprotomy  04/2012    for cancer  . Colonoscopy    . Cholecystectomy N/A 05/03/2013    Procedure: LAPAROSCOPIC CHOLECYSTECTOMY WITH INTRAOPERATIVE CHOLANGIOGRAM;  Surgeon: Odis Hollingshead, MD;  Location: [place];  Service: General;  Laterality: N/A;    Allergies  Allergen Reactions  . Codeine     REACTION: Disoriented  . Dust Mite Extract Cough  . Molds & Smuts Cough  . Morphine And Related     Severe headache    Family history and social history have been reviewed.  Review of Systems: Negative for diarrhea abdominal pain nausea or vomiting  The remainder of the 10 point ROS is negative except as outlined in the H&P  Physical Exam: General Appearance thin in no distress Eyes  Non icteric  HEENT  Non traumatic, normocephalic  Mouth No lesion, tongue papillated, no cheilosis Neck Supple without adenopathy, thyroid not enlarged, no carotid bruits, no JVD Lungs Clear to auscultation bilaterally COR Normal S1, normal S2, regular rhythm, no murmur, quiet precordium Abdomen soft nontender abdomen with normoactive bowel sounds. No distention. Liver edge at costal margin Rectal not done Extremities  No pedal edema Skin No lesions Neurological Alert and oriented x 3 Psychological Normal mood and affect  Assessment and Plan:   64 year old white female with the gastroesophageal reflux and esophageal stricture currently under good control on Nexium 40 mg daily. She will continue the same regimen. No need for recall endoscopy.   Colorectal screening. Normal colonoscopy in September 2014. Recall colonoscopy in 10 years in  September 2024  Leiomyosarcoma  of the uterus and vagina in December 2013. Status post an extensive chemotherapy resulting in diarrhea and massive weight loss. Currently stabilized,she has  been disease-free. Followed by oncology     [name] 01/01/2015

## 2015-01-01 NOTE — Patient Instructions (Addendum)
Follow up as needed with Dr Silverio Decamp,  Dr Stevenson Clinch

## 2015-01-08 ENCOUNTER — Ambulatory Visit (HOSPITAL_COMMUNITY): Payer: Medicare Other

## 2015-01-08 DIAGNOSIS — R300 Dysuria: Secondary | ICD-10-CM | POA: Diagnosis not present

## 2015-01-08 DIAGNOSIS — N3090 Cystitis, unspecified without hematuria: Secondary | ICD-10-CM | POA: Diagnosis not present

## 2015-01-09 ENCOUNTER — Ambulatory Visit: Payer: Self-pay | Admitting: Neurology

## 2015-01-24 ENCOUNTER — Ambulatory Visit: Payer: Self-pay | Admitting: Internal Medicine

## 2015-01-27 ENCOUNTER — Ambulatory Visit: Payer: Medicare Other | Admitting: Psychology

## 2015-02-03 ENCOUNTER — Ambulatory Visit (INDEPENDENT_AMBULATORY_CARE_PROVIDER_SITE_OTHER): Payer: Medicare Other | Admitting: Psychology

## 2015-02-03 DIAGNOSIS — F4321 Adjustment disorder with depressed mood: Secondary | ICD-10-CM | POA: Diagnosis not present

## 2015-02-11 DIAGNOSIS — F331 Major depressive disorder, recurrent, moderate: Secondary | ICD-10-CM | POA: Diagnosis not present

## 2015-02-11 DIAGNOSIS — F411 Generalized anxiety disorder: Secondary | ICD-10-CM | POA: Diagnosis not present

## 2015-02-13 DIAGNOSIS — M5412 Radiculopathy, cervical region: Secondary | ICD-10-CM | POA: Diagnosis not present

## 2015-02-13 DIAGNOSIS — M542 Cervicalgia: Secondary | ICD-10-CM | POA: Diagnosis not present

## 2015-02-19 ENCOUNTER — Ambulatory Visit: Payer: Medicare Other | Admitting: Psychology

## 2015-02-19 ENCOUNTER — Other Ambulatory Visit: Payer: Self-pay | Admitting: Neurosurgery

## 2015-02-19 DIAGNOSIS — M5412 Radiculopathy, cervical region: Secondary | ICD-10-CM

## 2015-03-03 ENCOUNTER — Telehealth: Payer: Self-pay | Admitting: Internal Medicine

## 2015-03-03 MED ORDER — METRONIDAZOLE 250 MG PO TABS: 250.0000 mg | ORAL_TABLET | Freq: Three times a day (TID) | ORAL | Status: DC

## 2015-03-03 NOTE — Telephone Encounter (Signed)
Spoke with patient and she would like to have Dr. Silverio Decamp as new MD. She states she has had nausea and diarrhea for the last few days. States she is using Imodium and eating a soft diet. She has abdominal cramping after eating. Diarrhea x 1 this AM and twice yesterday- even with Imodium. No fever or vomiting. She states she took Flagyl last time she had this and it helped. No OV with extenders this week and Dr. Silverio Decamp is booked until December.DOD- please, advise.

## 2015-03-03 NOTE — Telephone Encounter (Signed)
Rx sent. Patient aware of recommendations. Scheduled OV with Dr. Silverio Decamp on 05/02/15.

## 2015-03-03 NOTE — Telephone Encounter (Signed)
Last office visit with Dr. Olevia Perches reviewed Would retreat with metronidazole 250 mg 3 times a day 7 days and follow-up with Dr. Silverio Decamp as she desires to establish care If symptoms do not improve or worsen she should be seen by advanced practitioner more acutely

## 2015-03-04 ENCOUNTER — Ambulatory Visit (INDEPENDENT_AMBULATORY_CARE_PROVIDER_SITE_OTHER): Payer: Medicare Other | Admitting: Psychology

## 2015-03-04 DIAGNOSIS — F4321 Adjustment disorder with depressed mood: Secondary | ICD-10-CM

## 2015-03-13 ENCOUNTER — Ambulatory Visit (INDEPENDENT_AMBULATORY_CARE_PROVIDER_SITE_OTHER): Payer: Medicare Other | Admitting: Psychology

## 2015-03-13 DIAGNOSIS — F4321 Adjustment disorder with depressed mood: Secondary | ICD-10-CM | POA: Diagnosis not present

## 2015-03-14 DIAGNOSIS — F411 Generalized anxiety disorder: Secondary | ICD-10-CM | POA: Diagnosis not present

## 2015-03-14 DIAGNOSIS — F331 Major depressive disorder, recurrent, moderate: Secondary | ICD-10-CM | POA: Diagnosis not present

## 2015-03-18 ENCOUNTER — Ambulatory Visit (INDEPENDENT_AMBULATORY_CARE_PROVIDER_SITE_OTHER): Payer: Medicare Other | Admitting: Psychology

## 2015-03-18 DIAGNOSIS — F4321 Adjustment disorder with depressed mood: Secondary | ICD-10-CM | POA: Diagnosis not present

## 2015-03-20 ENCOUNTER — Other Ambulatory Visit (INDEPENDENT_AMBULATORY_CARE_PROVIDER_SITE_OTHER): Payer: Medicare Other

## 2015-03-20 ENCOUNTER — Ambulatory Visit (INDEPENDENT_AMBULATORY_CARE_PROVIDER_SITE_OTHER): Payer: Medicare Other | Admitting: Neurology

## 2015-03-20 ENCOUNTER — Encounter: Payer: Self-pay | Admitting: Neurology

## 2015-03-20 VITALS — BP 140/90 | HR 70 | Ht 62.75 in | Wt 118.6 lb

## 2015-03-20 DIAGNOSIS — R51 Headache: Secondary | ICD-10-CM | POA: Diagnosis not present

## 2015-03-20 DIAGNOSIS — F331 Major depressive disorder, recurrent, moderate: Secondary | ICD-10-CM

## 2015-03-20 DIAGNOSIS — R519 Headache, unspecified: Secondary | ICD-10-CM

## 2015-03-20 DIAGNOSIS — F0781 Postconcussional syndrome: Secondary | ICD-10-CM

## 2015-03-20 DIAGNOSIS — R4189 Other symptoms and signs involving cognitive functions and awareness: Secondary | ICD-10-CM

## 2015-03-20 DIAGNOSIS — R413 Other amnesia: Secondary | ICD-10-CM

## 2015-03-20 DIAGNOSIS — M542 Cervicalgia: Secondary | ICD-10-CM

## 2015-03-20 LAB — TSH: TSH: 0.49 u[IU]/mL (ref 0.35–4.50)

## 2015-03-20 MED ORDER — TOPIRAMATE ER 100 MG PO CAP24
100.0000 mg | ORAL_CAPSULE | Freq: Every day | ORAL | Status: DC
Start: 2015-03-20 — End: 2015-09-24
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
## 2015-03-20 NOTE — Patient Instructions (Addendum)
1.  Check blood work 2.  MRI brain without contrast  3.  Stop topirmate 4.  Start Trokendi XR '100mg'$  daily 5.  If you have any safety concerns at home, go directly to your nearest emergency room or call 911 6.  Call with an update in one month and if your headaches are not improved, we will switch you to amitriptyline  7. You have been scheduled at [facility] for MRI on 03/25/15. Please arrive @ 2:30 pm.     [place], [place] [address]    [phone] Return to clinic in 3 months

## 2015-03-21 LAB — VITAMIN B12: Vitamin B-12: 379 pg/mL (ref 211–911)

## 2015-03-24 ENCOUNTER — Other Ambulatory Visit: Payer: Self-pay

## 2015-03-25 ENCOUNTER — Other Ambulatory Visit: Payer: Self-pay

## 2015-03-31 DIAGNOSIS — F330 Major depressive disorder, recurrent, mild: Secondary | ICD-10-CM | POA: Diagnosis not present

## 2015-04-01 ENCOUNTER — Inpatient Hospital Stay: Admission: RE | Admit: 2015-04-01 | Payer: Self-pay | Source: Ambulatory Visit

## 2015-04-01 ENCOUNTER — Ambulatory Visit: Payer: Medicare Other | Admitting: Psychology

## 2015-04-01 ENCOUNTER — Other Ambulatory Visit: Payer: Self-pay

## 2015-04-02 DIAGNOSIS — Z23 Encounter for immunization: Secondary | ICD-10-CM | POA: Diagnosis not present

## 2015-04-07 ENCOUNTER — Ambulatory Visit: Payer: Self-pay | Admitting: Neurology

## 2015-04-15 ENCOUNTER — Ambulatory Visit (INDEPENDENT_AMBULATORY_CARE_PROVIDER_SITE_OTHER): Payer: Medicare Other | Admitting: Psychology

## 2015-04-15 DIAGNOSIS — F4321 Adjustment disorder with depressed mood: Secondary | ICD-10-CM

## 2015-04-16 DIAGNOSIS — R5383 Other fatigue: Secondary | ICD-10-CM

## 2015-04-16 DIAGNOSIS — S060X0D Concussion without loss of consciousness, subsequent encounter: Secondary | ICD-10-CM | POA: Diagnosis not present

## 2015-04-16 DIAGNOSIS — R5381 Other malaise: Secondary | ICD-10-CM

## 2015-04-16 DIAGNOSIS — Z6820 Body mass index (BMI) 20.0-20.9, adult: Secondary | ICD-10-CM | POA: Diagnosis not present

## 2015-04-16 DIAGNOSIS — G4452 New daily persistent headache (NDPH): Secondary | ICD-10-CM | POA: Diagnosis not present

## 2015-04-16 DIAGNOSIS — M5412 Radiculopathy, cervical region: Secondary | ICD-10-CM | POA: Diagnosis not present

## 2015-04-16 HISTORY — DX: Other malaise: R53.81

## 2015-04-21 ENCOUNTER — Telehealth: Payer: Self-pay | Admitting: *Deleted

## 2015-04-21 ENCOUNTER — Telehealth: Payer: Self-pay | Admitting: Neurology

## 2015-04-21 DIAGNOSIS — M5412 Radiculopathy, cervical region: Secondary | ICD-10-CM | POA: Diagnosis not present

## 2015-04-21 DIAGNOSIS — M5481 Occipital neuralgia: Secondary | ICD-10-CM | POA: Diagnosis not present

## 2015-04-21 DIAGNOSIS — M542 Cervicalgia: Secondary | ICD-10-CM | POA: Diagnosis not present

## 2015-04-21 NOTE — Telephone Encounter (Signed)
OK to order MRI brain and cervical spine wo contrast - please schedule at [facility]. Thanks.

## 2015-04-21 NOTE — Telephone Encounter (Signed)
PT called in regards to having a MRI set up/Dawn [phone]

## 2015-04-21 NOTE — Telephone Encounter (Signed)
Patient would like for me to reschedule her MRI brain.  She said that Dr. Joya Salm requested for [place] to order cervical also.  Patient is claustrophobic.  Please advise.

## 2015-04-22 ENCOUNTER — Other Ambulatory Visit: Payer: Self-pay | Admitting: *Deleted

## 2015-04-22 DIAGNOSIS — R4189 Other symptoms and signs involving cognitive functions and awareness: Secondary | ICD-10-CM

## 2015-04-22 DIAGNOSIS — F0781 Postconcussional syndrome: Secondary | ICD-10-CM

## 2015-04-22 DIAGNOSIS — R519 Headache, unspecified: Secondary | ICD-10-CM

## 2015-04-22 DIAGNOSIS — R413 Other amnesia: Secondary | ICD-10-CM

## 2015-04-22 DIAGNOSIS — M542 Cervicalgia: Secondary | ICD-10-CM

## 2015-04-22 DIAGNOSIS — F331 Major depressive disorder, recurrent, moderate: Secondary | ICD-10-CM

## 2015-04-22 DIAGNOSIS — R51 Headache: Secondary | ICD-10-CM

## 2015-04-22 NOTE — Telephone Encounter (Signed)
Order faxed to [facility].

## 2015-04-29 ENCOUNTER — Ambulatory Visit (INDEPENDENT_AMBULATORY_CARE_PROVIDER_SITE_OTHER): Payer: Medicare Other | Admitting: Psychology

## 2015-04-29 DIAGNOSIS — F0781 Postconcussional syndrome: Secondary | ICD-10-CM | POA: Diagnosis not present

## 2015-04-29 DIAGNOSIS — M47814 Spondylosis without myelopathy or radiculopathy, thoracic region: Secondary | ICD-10-CM | POA: Diagnosis not present

## 2015-04-29 DIAGNOSIS — F4321 Adjustment disorder with depressed mood: Secondary | ICD-10-CM

## 2015-04-29 DIAGNOSIS — R413 Other amnesia: Secondary | ICD-10-CM | POA: Diagnosis not present

## 2015-04-29 DIAGNOSIS — R51 Headache: Secondary | ICD-10-CM | POA: Diagnosis not present

## 2015-04-29 DIAGNOSIS — Z981 Arthrodesis status: Secondary | ICD-10-CM | POA: Diagnosis not present

## 2015-04-29 DIAGNOSIS — M47813 Spondylosis without myelopathy or radiculopathy, cervicothoracic region: Secondary | ICD-10-CM | POA: Diagnosis not present

## 2015-05-01 DIAGNOSIS — F330 Major depressive disorder, recurrent, mild: Secondary | ICD-10-CM | POA: Diagnosis not present

## 2015-05-02 ENCOUNTER — Ambulatory Visit: Payer: Self-pay | Admitting: Gastroenterology

## 2015-05-02 ENCOUNTER — Telehealth: Payer: Self-pay | Admitting: Neurology

## 2015-05-02 NOTE — Telephone Encounter (Signed)
Pls let her know that MRI brain and cervical spine done at [facility] showed normal brain, no evidence of tumor, stroke, or bleed. The cervical spine shows post-surgical changes and some arthritis changes. Thanks

## 2015-05-02 NOTE — Telephone Encounter (Signed)
Please advise 

## 2015-05-02 NOTE — Telephone Encounter (Signed)
Pt called for MRI results/ [phone]

## 2015-05-05 NOTE — Telephone Encounter (Signed)
Patient given results

## 2015-05-09 ENCOUNTER — Telehealth: Payer: Self-pay | Admitting: Neurology

## 2015-05-09 NOTE — Telephone Encounter (Signed)
Report sent.

## 2015-05-09 NOTE — Telephone Encounter (Signed)
MRI brain wo contrast 04/29/2015:  Normal MRI brain.    MRI cervical spine wo contrast  04/29/2015:  Status post anterior cervical fusion C3-C7.  No significant spinal canal stenosis or cord impingement.  Moderate facet arthrosis at C7-T1 and T1-2.  Moderate right foraminal stenosis at T1-T2.  Caryl Pina, please fax these results to Dr. Harley Hallmark office - report is on my desk. Thanks.  Donika K. Posey Pronto, DO

## 2015-05-14 ENCOUNTER — Ambulatory Visit (INDEPENDENT_AMBULATORY_CARE_PROVIDER_SITE_OTHER): Payer: Medicare Other | Admitting: Psychology

## 2015-05-14 DIAGNOSIS — F4321 Adjustment disorder with depressed mood: Secondary | ICD-10-CM

## 2015-05-20 DIAGNOSIS — M5412 Radiculopathy, cervical region: Secondary | ICD-10-CM | POA: Diagnosis not present

## 2015-05-20 DIAGNOSIS — M47812 Spondylosis without myelopathy or radiculopathy, cervical region: Secondary | ICD-10-CM | POA: Diagnosis not present

## 2015-05-22 ENCOUNTER — Encounter: Payer: Self-pay | Admitting: Neurology

## 2015-05-29 DIAGNOSIS — F330 Major depressive disorder, recurrent, mild: Secondary | ICD-10-CM | POA: Diagnosis not present

## 2015-06-05 ENCOUNTER — Ambulatory Visit: Payer: Medicare Other | Admitting: Psychology

## 2015-06-10 DIAGNOSIS — M47812 Spondylosis without myelopathy or radiculopathy, cervical region: Secondary | ICD-10-CM | POA: Diagnosis not present

## 2015-06-11 ENCOUNTER — Ambulatory Visit (INDEPENDENT_AMBULATORY_CARE_PROVIDER_SITE_OTHER): Payer: Medicare Other | Admitting: Psychology

## 2015-06-11 DIAGNOSIS — F331 Major depressive disorder, recurrent, moderate: Secondary | ICD-10-CM

## 2015-06-16 DIAGNOSIS — H25813 Combined forms of age-related cataract, bilateral: Secondary | ICD-10-CM | POA: Diagnosis not present

## 2015-06-16 DIAGNOSIS — H04123 Dry eye syndrome of bilateral lacrimal glands: Secondary | ICD-10-CM | POA: Diagnosis not present

## 2015-06-16 DIAGNOSIS — H179 Unspecified corneal scar and opacity: Secondary | ICD-10-CM | POA: Diagnosis not present

## 2015-06-18 ENCOUNTER — Ambulatory Visit: Payer: Medicare Other | Admitting: Psychology

## 2015-06-18 DIAGNOSIS — N390 Urinary tract infection, site not specified: Secondary | ICD-10-CM | POA: Diagnosis not present

## 2015-06-18 DIAGNOSIS — Z124 Encounter for screening for malignant neoplasm of cervix: Secondary | ICD-10-CM | POA: Diagnosis not present

## 2015-06-18 DIAGNOSIS — Z6820 Body mass index (BMI) 20.0-20.9, adult: Secondary | ICD-10-CM | POA: Diagnosis not present

## 2015-06-18 DIAGNOSIS — N3945 Continuous leakage: Secondary | ICD-10-CM | POA: Diagnosis not present

## 2015-06-18 DIAGNOSIS — R350 Frequency of micturition: Secondary | ICD-10-CM | POA: Diagnosis not present

## 2015-06-25 ENCOUNTER — Ambulatory Visit: Payer: Medicare Other | Admitting: Psychology

## 2015-06-30 DIAGNOSIS — G4452 New daily persistent headache (NDPH): Secondary | ICD-10-CM | POA: Diagnosis not present

## 2015-06-30 DIAGNOSIS — Z23 Encounter for immunization: Secondary | ICD-10-CM | POA: Diagnosis not present

## 2015-06-30 DIAGNOSIS — M5412 Radiculopathy, cervical region: Secondary | ICD-10-CM | POA: Diagnosis not present

## 2015-06-30 DIAGNOSIS — F332 Major depressive disorder, recurrent severe without psychotic features: Secondary | ICD-10-CM | POA: Diagnosis not present

## 2015-06-30 DIAGNOSIS — G3184 Mild cognitive impairment, so stated: Secondary | ICD-10-CM | POA: Diagnosis not present

## 2015-06-30 DIAGNOSIS — Z6821 Body mass index (BMI) 21.0-21.9, adult: Secondary | ICD-10-CM | POA: Diagnosis not present

## 2015-07-04 ENCOUNTER — Ambulatory Visit: Payer: Self-pay | Admitting: Gastroenterology

## 2015-07-08 DIAGNOSIS — M47812 Spondylosis without myelopathy or radiculopathy, cervical region: Secondary | ICD-10-CM | POA: Diagnosis not present

## 2015-07-08 DIAGNOSIS — M5481 Occipital neuralgia: Secondary | ICD-10-CM | POA: Diagnosis not present

## 2015-07-08 DIAGNOSIS — M5412 Radiculopathy, cervical region: Secondary | ICD-10-CM | POA: Diagnosis not present

## 2015-07-08 DIAGNOSIS — M542 Cervicalgia: Secondary | ICD-10-CM | POA: Diagnosis not present

## 2015-07-09 ENCOUNTER — Ambulatory Visit (INDEPENDENT_AMBULATORY_CARE_PROVIDER_SITE_OTHER): Payer: Medicare Other | Admitting: Psychology

## 2015-07-09 DIAGNOSIS — F4321 Adjustment disorder with depressed mood: Secondary | ICD-10-CM | POA: Diagnosis not present

## 2015-07-11 ENCOUNTER — Ambulatory Visit: Payer: Self-pay | Admitting: Gynecology

## 2015-07-14 ENCOUNTER — Ambulatory Visit: Payer: Self-pay | Admitting: Neurology

## 2015-07-16 ENCOUNTER — Ambulatory Visit: Payer: Self-pay | Admitting: Neurology

## 2015-07-22 ENCOUNTER — Ambulatory Visit: Payer: Self-pay | Admitting: Gastroenterology

## 2015-07-22 ENCOUNTER — Telehealth: Payer: Self-pay | Admitting: Gastroenterology

## 2015-07-22 DIAGNOSIS — N393 Stress incontinence (female) (male): Secondary | ICD-10-CM | POA: Diagnosis not present

## 2015-07-22 DIAGNOSIS — N390 Urinary tract infection, site not specified: Secondary | ICD-10-CM | POA: Insufficient documentation

## 2015-07-22 HISTORY — DX: Urinary tract infection, site not specified: N39.0

## 2015-07-28 DIAGNOSIS — M5481 Occipital neuralgia: Secondary | ICD-10-CM | POA: Diagnosis not present

## 2015-07-29 DIAGNOSIS — F330 Major depressive disorder, recurrent, mild: Secondary | ICD-10-CM | POA: Diagnosis not present

## 2015-07-30 ENCOUNTER — Other Ambulatory Visit: Payer: Self-pay | Admitting: Neurosurgery

## 2015-07-30 DIAGNOSIS — M542 Cervicalgia: Secondary | ICD-10-CM

## 2015-07-31 DIAGNOSIS — R419 Unspecified symptoms and signs involving cognitive functions and awareness: Secondary | ICD-10-CM | POA: Diagnosis not present

## 2015-07-31 DIAGNOSIS — E039 Hypothyroidism, unspecified: Secondary | ICD-10-CM | POA: Diagnosis not present

## 2015-07-31 DIAGNOSIS — E785 Hyperlipidemia, unspecified: Secondary | ICD-10-CM | POA: Diagnosis not present

## 2015-07-31 DIAGNOSIS — E559 Vitamin D deficiency, unspecified: Secondary | ICD-10-CM | POA: Diagnosis not present

## 2015-08-04 ENCOUNTER — Ambulatory Visit: Payer: Self-pay | Admitting: Neurology

## 2015-08-06 ENCOUNTER — Ambulatory Visit (INDEPENDENT_AMBULATORY_CARE_PROVIDER_SITE_OTHER): Payer: Medicare Other | Admitting: Psychology

## 2015-08-06 DIAGNOSIS — F4321 Adjustment disorder with depressed mood: Secondary | ICD-10-CM

## 2015-08-11 ENCOUNTER — Other Ambulatory Visit: Payer: Medicare Other

## 2015-08-11 ENCOUNTER — Encounter: Payer: Self-pay | Admitting: Gynecology

## 2015-08-11 ENCOUNTER — Ambulatory Visit: Payer: Medicare Other | Attending: Gynecology | Admitting: Gynecology

## 2015-08-11 VITALS — BP 111/58 | HR 60 | Temp 98.2°F | Resp 18 | Wt 125.4 lb

## 2015-08-11 DIAGNOSIS — Z87440 Personal history of urinary (tract) infections: Secondary | ICD-10-CM | POA: Insufficient documentation

## 2015-08-11 DIAGNOSIS — Z8544 Personal history of malignant neoplasm of other female genital organs: Secondary | ICD-10-CM | POA: Diagnosis not present

## 2015-08-11 DIAGNOSIS — F419 Anxiety disorder, unspecified: Secondary | ICD-10-CM | POA: Insufficient documentation

## 2015-08-11 DIAGNOSIS — J45909 Unspecified asthma, uncomplicated: Secondary | ICD-10-CM | POA: Diagnosis not present

## 2015-08-11 DIAGNOSIS — F319 Bipolar disorder, unspecified: Secondary | ICD-10-CM | POA: Diagnosis not present

## 2015-08-11 DIAGNOSIS — M797 Fibromyalgia: Secondary | ICD-10-CM | POA: Insufficient documentation

## 2015-08-11 DIAGNOSIS — C499 Malignant neoplasm of connective and soft tissue, unspecified: Secondary | ICD-10-CM | POA: Diagnosis not present

## 2015-08-11 DIAGNOSIS — I82499 Acute embolism and thrombosis of other specified deep vein of unspecified lower extremity: Secondary | ICD-10-CM | POA: Insufficient documentation

## 2015-08-11 DIAGNOSIS — K219 Gastro-esophageal reflux disease without esophagitis: Secondary | ICD-10-CM | POA: Insufficient documentation

## 2015-08-11 DIAGNOSIS — K589 Irritable bowel syndrome without diarrhea: Secondary | ICD-10-CM | POA: Diagnosis not present

## 2015-08-11 DIAGNOSIS — E785 Hyperlipidemia, unspecified: Secondary | ICD-10-CM | POA: Diagnosis not present

## 2015-08-11 DIAGNOSIS — D351 Benign neoplasm of parathyroid gland: Secondary | ICD-10-CM | POA: Diagnosis not present

## 2015-08-11 DIAGNOSIS — C52 Malignant neoplasm of vagina: Secondary | ICD-10-CM | POA: Insufficient documentation

## 2015-08-11 DIAGNOSIS — N393 Stress incontinence (female) (male): Secondary | ICD-10-CM | POA: Insufficient documentation

## 2015-08-11 DIAGNOSIS — K2970 Gastritis, unspecified, without bleeding: Secondary | ICD-10-CM | POA: Diagnosis not present

## 2015-08-11 LAB — BUN AND CREATININE (CC13)
BUN: 19.9 mg/dL (ref 7.0–26.0)
Creatinine: 1 mg/dL (ref 0.6–1.1)
EGFR: 62 mL/min/{1.73_m2} — ABNORMAL LOW (ref >90)

## 2015-08-11 NOTE — Patient Instructions (Signed)
Go to lab today , we will contact you with the appointment for the CT Scans , please call us in July for your 6 month follow up with Dr Fermin Schwab  Thank you!

## 2015-08-11 NOTE — Progress Notes (Signed)
Consult Note: Gyn-Onc   [name] 65 y.o. female  No chief complaint on file.   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 Clinically NED.  Plan: We will continue to do surveillance with CT scans every 6 months (chest abdomen and pelvis). We will organize 1 for the near future and then one in 6 months L plan on seeing the patient shortly after the CT in 6 months.   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 The patient's gynecologic history dates back to 2005 when she underwent a LAVH BSO by Dr. Molli Grant for symptomatic uterine fibroids. The patient then presented in 2013 initially with the vaginal discharge and then heavy bleeding. She was found to have a 4.7 cm lower sarcoma the apex of the vagina. She underwent exploratory laparotomy with resection of the tumor. Pathology report shows negative margins although one margin was only 1 mm. There were greater than 10 mitoses per 10 high-powered fields. The patient received Gemzar and Taxotere as an adjunct postoperatively. She's been followed in the [name] system with CT or PET scans every 6 months.  Today the patient is entirely asymptomatic. She specifically denies any pelvic pain pressure vaginal bleeding or discharge. She has had difficulties with irritable bowel which were probably aggravated by the stress of a divorce. The patient's been under the care of Dr. Delfin Grant.  Patient does have a history of recurrent urinary tract infections and some stress incontinence. She is under the care of Dr. Alona Grant for these urological symptoms and problems.  Obstetrical history gravida 3 para 2 with 1 cesarean section.  Review of Systems:10 point review of systems is negative except as noted in interval history.   Vitals: Blood pressure 111/58, pulse 60, temperature 98.2 F (36.8 C), temperature source Oral, resp. rate 18,  weight 125 lb 6.4 oz (56.881 kg), SpO2 96 %.  Physical Exam: General : The patient is a healthy woman in no acute distress.  HEENT: normocephalic, extraoccular movements normal; neck is supple without thyromegally  Lynphnodes: Supraclavicular and inguinal nodes not enlarged  Abdomen: Soft, non-tender, no ascites, no organomegally, no masses, no hernias  Pelvic:  EGBUS: Normal female  Vagina: Normal, no lesions  Urethra and Bladder: Normal, non-tender  Cervix: Surgically absent  Uterus: Surgically absent  Bi-manual examination: Non-tender; no adenxal masses or nodularity  Rectal: normal sphincter tone, no masses, no blood  Lower extremities: No edema or varicosities. Normal range of motion      Allergies  Allergen Reactions  . Codeine     REACTION: Disoriented  . Dust Mite Extract Cough  . Molds & Smuts Cough  . Morphine And Related     Severe headache    Past Medical History  Diagnosis Date  . Deep phlebothrombosis, postpartum, unspecified as to episode of care(671.40)   . Bipolar I disorder, most recent episode (or current) depressed, severe, without mention of psychotic behavior   . Other and unspecified hyperlipidemia   . IBS (irritable bowel syndrome)   . GERD (gastroesophageal reflux disease)   . Allergic rhinitis, cause unspecified   . Hyperlipidemia   . Gastritis   . Depression   . Anxiety   . Migraine headache   . Leiomyosarcoma of uterus ([name])     vaginal wall  . Blood transfusion without reported diagnosis   . Parathyroid adenoma 2014  . Status post chemotherapy 2014  .  Asthma     chronic cough  . Fibromyalgia     Past Surgical History  Procedure Laterality Date  . Cesarean section  1986  . Vaginal hysterectomy    . Pubovaginal sling    . Neck surgery  2006    C spine fusion  . Laprotomy  04/2012    for cancer  . Colonoscopy    . Cholecystectomy N/A 05/03/2013    Procedure: LAPAROSCOPIC CHOLECYSTECTOMY WITH INTRAOPERATIVE CHOLANGIOGRAM;   Surgeon: Charlotte Hollingshead, MD;  Location: [facility];  Service: General;  Laterality: N/A;    Current Outpatient Prescriptions  Medication Sig Dispense Refill  . ALPRAZolam (XANAX) 0.5 MG tablet Take 0.5 mg by mouth at bedtime as needed for anxiety.    . baclofen (LIORESAL) 10 MG tablet Take 10 mg by mouth as needed for muscle spasms.    . diphenoxylate-atropine (LOMOTIL) 2.5-0.025 MG per tablet Take 1 tablet by mouth 2 (two) times daily as needed for diarrhea or loose stools. (Patient taking differently: Take 1 tablet by mouth daily. ) 180 tablet 1  . esomeprazole (NEXIUM) 40 MG capsule Take 1 capsule (40 mg total) by mouth 2 (two) times daily before a meal. 180 capsule 1  . esomeprazole (NEXIUM) 40 MG capsule Take 30 min prior to breakfast 30 capsule 6  . estradiol (ESTRACE) 1 MG tablet Take 1 tablet (1 mg total) by mouth daily. 90 tablet 3  . FLUoxetine (PROZAC) 20 MG capsule Take 20 mg by mouth daily.    Marland Kitchen levothyroxine (SYNTHROID, LEVOTHROID) 100 MCG tablet Take 1 tablet (100 mcg total) by mouth daily before breakfast.    . metroNIDAZOLE (FLAGYL) 250 MG tablet Take 1 tablet (250 mg total) by mouth 3 (three) times daily. 21 tablet 0  . mirtazapine (REMERON) 15 MG tablet Take 1 tablet (15 mg total) by mouth at bedtime. (Patient taking differently: Take 30 mg by mouth at bedtime. ) 30 tablet 0  . Multiple Vitamins-Minerals (MULTIVITAMIN WITH MINERALS) tablet Take 1 tablet by mouth daily.    . ondansetron (ZOFRAN) 4 MG tablet Take 1 tablet (4 mg total) by mouth every 8 (eight) hours as needed for nausea. 90 tablet 1  . ondansetron (ZOFRAN) 4 MG tablet Take 1 tablet (4 mg total) by mouth 2 (two) times daily as needed for nausea or vomiting. 60 tablet 1  . progesterone (PROMETRIUM) 100 MG capsule Take 1 capsule (100 mg total) by mouth 2 (two) times a week.    . SUMAtriptan (IMITREX) 6 MG/0.5ML SOLN injection Inject 6 mg into the skin every 2 (two) hours as needed for migraine or headache. May repeat in  2 hours if headache persists or recurs.    . Topiramate ER (TROKENDI XR) 100 MG CP24 Take 100 mg by mouth daily. 30 capsule 11  . traMADol (ULTRAM) 50 MG tablet Take by mouth every 6 (six) hours as needed.    . traZODone (DESYREL) 50 MG tablet Take 1 tablet (50 mg total) by mouth at bedtime as needed for sleep. 30 tablet 0  . Vitamin D, Ergocalciferol, (DRISDOL) 50000 UNITS CAPS capsule Take 1 capsule (50,000 Units total) by mouth 2 (two) times a week. Wednesday 30 capsule    No current facility-administered medications for this visit.    Social History   Social History  . Marital Status: Married    Spouse Name: N/A  . Number of Children: 2  . 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 Smoking status: Never Smoker   . Smokeless tobacco: Never Used  . Alcohol Use: 1.2 - 1.8 oz/week    2-3 Standard drinks or equivalent per week     Comment: rarely  . Drug Use: No  . Sexual Activity:    Partners: Male   Other Topics Concern  . Not on file   Social History Narrative   Married 4 years- divorced; married 10 years- divorced. Serially monogamous relationships. Remarried March '11. 2 sons- '82, '87.    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 (June "12).    Lives in her own home.  Currently going through a divorce.    Family History  Problem Relation Age of Onset  . Coronary artery disease Father   . Heart disease Father   . Hypertension Father   . Irritable bowel syndrome Father   . Diabetes Maternal Grandmother   . Arthritis Mother   . Hypertension Mother   . Migraines Mother   . Heart disease Mother   . Irritable bowel syndrome Sister   . Irritable bowel syndrome Brother   . Colon cancer Neg Hx   . Esophageal cancer Neg Hx   . Rectal cancer Neg Hx   . Stomach cancer Neg Hx       CLARKE-PEARSON,Gari Hartsell L, MD 08/11/2015, 12:32  PM

## 2015-08-14 ENCOUNTER — Telehealth: Payer: Self-pay | Admitting: *Deleted

## 2015-08-14 NOTE — Addendum Note (Signed)
Addended by: Joylene John D on: 08/14/2015 12:10 PM   Modules accepted: Orders

## 2015-08-14 NOTE — Telephone Encounter (Signed)
Left message on voicemail with future appointment information. Pt is scheduled for a CT scan at [facility] on 08/22/15 @ 1:00pm. Pt was advised to arrive at 12:30 and to please come by the office to pick up instructions and contrast.

## 2015-08-14 NOTE — Addendum Note (Signed)
Addended by: Joylene John D on: 08/14/2015 11:46 AM   Modules accepted: Orders

## 2015-08-18 ENCOUNTER — Ambulatory Visit
Admission: RE | Admit: 2015-08-18 | Discharge: 2015-08-18 | Disposition: A | Discharge status: Home / Self Care | Payer: Medicare Other | Source: Ambulatory Visit | Attending: Neurosurgery | Admitting: Neurosurgery

## 2015-08-18 DIAGNOSIS — M542 Cervicalgia: Secondary | ICD-10-CM

## 2015-08-18 DIAGNOSIS — M5031 Other cervical disc degeneration,  high cervical region: Secondary | ICD-10-CM | POA: Diagnosis not present

## 2015-08-18 MED ORDER — IOPAMIDOL (ISOVUE-M 300) INJECTION 61%
10.0000 mL | Freq: Once | INTRAMUSCULAR | Status: AC | PRN
  Administered 2015-08-18: 10 mL via INTRATHECAL

## 2015-08-18 MED ORDER — DIAZEPAM 5 MG PO TABS
5.0000 mg | ORAL_TABLET | Freq: Once | ORAL | Status: AC
  Administered 2015-08-18: 5 mg via ORAL

## 2015-08-18 NOTE — Discharge Instructions (Signed)
Myelogram Discharge Instructions  1. Go home and rest quietly for the next 24 hours.  It is important to lie flat for the next 24 hours.  Get up only to go to the restroom.  You may lie in the bed or on a couch on your back, your stomach, your left side or your right side.  You may have one pillow under your head.  You may have pillows between your knees while you are on your side or under your knees while you are on your back.  2. DO NOT drive today.  Recline the seat as far back as it will go, while still wearing your seat belt, on the way home.  3. You may get up to go to the bathroom as needed.  You may sit up for 10 minutes to eat.  You may resume your normal diet and medications unless otherwise indicated.  Drink plenty of extra fluids today and tomorrow.  4. The incidence of a spinal headache with nausea and/or vomiting is about 5% (one in 20 patients).  If you develop a headache, lie flat and drink plenty of fluids until the headache goes away.  Caffeinated beverages may be helpful.  If you develop severe nausea and vomiting or a headache that does not go away with flat bed rest, call [phone].  5. You may resume normal activities after your 24 hours of bed rest is over; however, do not exert yourself strongly or do any heavy lifting tomorrow.  6. Call your physician for a follow-up appointment.   You may resume Buspar, Imitrex, Prozac, Remeron and Tramadol on Tuesday, August 19, 2015 after 11:00am.

## 2015-08-20 ENCOUNTER — Ambulatory Visit (INDEPENDENT_AMBULATORY_CARE_PROVIDER_SITE_OTHER): Payer: Medicare Other | Admitting: Psychology

## 2015-08-20 DIAGNOSIS — F4321 Adjustment disorder with depressed mood: Secondary | ICD-10-CM

## 2015-08-21 ENCOUNTER — Ambulatory Visit: Payer: Self-pay | Admitting: Neurology

## 2015-08-21 DIAGNOSIS — M5412 Radiculopathy, cervical region: Secondary | ICD-10-CM | POA: Diagnosis not present

## 2015-08-22 ENCOUNTER — Ambulatory Visit (HOSPITAL_COMMUNITY): Payer: Medicare Other

## 2015-08-25 ENCOUNTER — Other Ambulatory Visit: Payer: Self-pay | Admitting: Neurosurgery

## 2015-08-25 DIAGNOSIS — R399 Unspecified symptoms and signs involving the genitourinary system: Secondary | ICD-10-CM | POA: Diagnosis not present

## 2015-08-26 ENCOUNTER — Ambulatory Visit (INDEPENDENT_AMBULATORY_CARE_PROVIDER_SITE_OTHER): Payer: Medicare Other | Admitting: Gastroenterology

## 2015-08-26 ENCOUNTER — Encounter: Payer: Self-pay | Admitting: Gastroenterology

## 2015-08-26 VITALS — BP 120/84 | HR 68 | Ht 62.75 in | Wt 125.4 lb

## 2015-08-26 DIAGNOSIS — K219 Gastro-esophageal reflux disease without esophagitis: Secondary | ICD-10-CM | POA: Diagnosis not present

## 2015-08-26 DIAGNOSIS — K580 Irritable bowel syndrome with diarrhea: Secondary | ICD-10-CM

## 2015-08-26 MED ORDER — ESOMEPRAZOLE MAGNESIUM 20 MG PO CPDR: 20.0000 mg | DELAYED_RELEASE_CAPSULE | Freq: Every day | ORAL | Status: DC

## 2015-08-26 NOTE — Patient Instructions (Signed)
Follow up as needed.  Thank you for choosing [facility]  Dr Wilfrid Lund III

## 2015-08-26 NOTE — Progress Notes (Signed)
[facility] Progress Note  Chief Complaint: IBS  Subjective History:   In Aug 2016, [name] wrote "This is a 65 year old white female presents for follow up of gastroesophageal reflux and esophageal stricture which was previously evaluated and dilated in 2001, 2005 and most recently in September 2014. 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 In addition she went through  a painful divorce and became quite depressed. We have done colonoscopy for colorectal screening.in September 2014, random biopsies of the colon showed normal mucosa without evidence of microscopic colitis. The diarrhea was initially profuse but has normalized after treatment with  cholestyramine, Flagyl and anti-spasmodics. Currently she is doing well.  Her gastroesophageal reflux is under good control. Her weight has stabilized around 113 pounds".  [name] is here to establish care with me, she is previously a patient of Dr. Olevia Grant. These days, she has very little abdominal pain. She has occasional diarrhea or constipation, which she treats with when necessary meds as listed. Typically, she does not need to take any of these. She went to a very rough period of about 2 years with the issues noted above, but now says she is feeling very well from a digestive standpoint.  ROS: Cardiovascular:  no chest pain Respiratory: no dyspnea  The patient's Past Medical, Family and Social History were reviewed and are on file in the EMR.  Objective:  Med list reviewed  Vital signs in last 24 hrs: Filed Vitals:   08/26/15 1327  BP: 120/84  Pulse: 68    Physical Exam   HEENT: sclera anicteric, oral mucosa moist without lesions  Neck: supple, no thyromegaly, JVD or lymphadenopathy  Cardiac: RRR without murmurs, S1S2 heard, no peripheral edema  Pulm: clear to auscultation bilaterally, normal RR and  effort noted  Abdomen: soft, No tenderness, with active bowel sounds. No guarding or palpable hepatosplenomegaly.  Skin; warm and dry, no jaundice or rash   '@ASSESSMENTPLANBEGIN'$ @ Assessment: Encounter Diagnoses  Name Primary?  . Irritable bowel syndrome with diarrhea Yes  . Gastroesophageal reflux disease without esophagitis       Plan: Try to cut back PPI to every other day See me in a year or sooner as needed   [name]

## 2015-08-28 ENCOUNTER — Ambulatory Visit (HOSPITAL_COMMUNITY): Payer: Medicare Other

## 2015-09-03 ENCOUNTER — Ambulatory Visit (INDEPENDENT_AMBULATORY_CARE_PROVIDER_SITE_OTHER): Payer: Medicare Other | Admitting: Psychology

## 2015-09-03 DIAGNOSIS — F4321 Adjustment disorder with depressed mood: Secondary | ICD-10-CM | POA: Diagnosis not present

## 2015-09-04 ENCOUNTER — Ambulatory Visit (HOSPITAL_COMMUNITY)
Admission: RE | Admit: 2015-09-04 | Discharge: 2015-09-04 | Disposition: A | Discharge status: Home / Self Care | Payer: Medicare Other | Source: Ambulatory Visit | Attending: Gynecologic Oncology | Admitting: Gynecologic Oncology

## 2015-09-04 DIAGNOSIS — C499 Malignant neoplasm of connective and soft tissue, unspecified: Secondary | ICD-10-CM | POA: Diagnosis not present

## 2015-09-04 DIAGNOSIS — N200 Calculus of kidney: Secondary | ICD-10-CM | POA: Insufficient documentation

## 2015-09-04 DIAGNOSIS — Z8544 Personal history of malignant neoplasm of other female genital organs: Secondary | ICD-10-CM | POA: Diagnosis not present

## 2015-09-04 DIAGNOSIS — C52 Malignant neoplasm of vagina: Secondary | ICD-10-CM | POA: Diagnosis not present

## 2015-09-04 DIAGNOSIS — J8410 Pulmonary fibrosis, unspecified: Secondary | ICD-10-CM | POA: Diagnosis not present

## 2015-09-04 MED ORDER — IOPAMIDOL (ISOVUE-300) INJECTION 61%
100.0000 mL | Freq: Once | INTRAVENOUS | Status: AC | PRN
  Administered 2015-09-04: 100 mL via INTRAVENOUS

## 2015-09-05 ENCOUNTER — Telehealth: Payer: Self-pay

## 2015-09-05 NOTE — Telephone Encounter (Signed)
Orders received from [facility] to contact the patient with CT of Abdomen and Pelvis W contrast results obtained on 09/04/2015 . "no evidence of recurrent or metastatic carcinoma within the chest , abdomen or pelvis", "tiny kidney stones noted ". No answer , left a detailed message with call back phone number provided if additional questions arise.

## 2015-09-09 DIAGNOSIS — N393 Stress incontinence (female) (male): Secondary | ICD-10-CM | POA: Diagnosis not present

## 2015-09-22 ENCOUNTER — Ambulatory Visit: Payer: Medicare Other | Admitting: Psychology

## 2015-09-22 DIAGNOSIS — F330 Major depressive disorder, recurrent, mild: Secondary | ICD-10-CM | POA: Diagnosis not present

## 2015-09-24 ENCOUNTER — Ambulatory Visit (INDEPENDENT_AMBULATORY_CARE_PROVIDER_SITE_OTHER): Payer: Medicare Other | Admitting: Neurology

## 2015-09-24 ENCOUNTER — Encounter: Payer: Self-pay | Admitting: Neurology

## 2015-09-24 VITALS — BP 118/80 | HR 81 | Ht 62.75 in | Wt 123.0 lb

## 2015-09-24 DIAGNOSIS — R51 Headache: Secondary | ICD-10-CM

## 2015-09-24 DIAGNOSIS — F0781 Postconcussional syndrome: Secondary | ICD-10-CM | POA: Diagnosis not present

## 2015-09-24 DIAGNOSIS — R519 Headache, unspecified: Secondary | ICD-10-CM

## 2015-09-24 NOTE — Patient Instructions (Signed)
We will call you if we hear anything about your financial assistance  Return to clinic in 6 months
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
## 2015-09-25 NOTE — Progress Notes (Signed)
Left message for patient that I have looked into financial assistance but have not had any luck.  Instructed her to continue with topiramate per Dr. Posey Pronto.

## 2015-09-30 DIAGNOSIS — H25813 Combined forms of age-related cataract, bilateral: Secondary | ICD-10-CM | POA: Diagnosis not present

## 2015-09-30 DIAGNOSIS — H524 Presbyopia: Secondary | ICD-10-CM | POA: Diagnosis not present

## 2015-09-30 DIAGNOSIS — H52223 Regular astigmatism, bilateral: Secondary | ICD-10-CM | POA: Diagnosis not present

## 2015-09-30 DIAGNOSIS — H5213 Myopia, bilateral: Secondary | ICD-10-CM | POA: Diagnosis not present

## 2015-10-01 ENCOUNTER — Ambulatory Visit: Payer: Medicare Other | Admitting: Psychology

## 2015-10-08 ENCOUNTER — Ambulatory Visit (INDEPENDENT_AMBULATORY_CARE_PROVIDER_SITE_OTHER): Payer: Medicare Other | Admitting: Psychology

## 2015-10-08 DIAGNOSIS — F4321 Adjustment disorder with depressed mood: Secondary | ICD-10-CM

## 2015-10-15 ENCOUNTER — Ambulatory Visit: Payer: Medicare Other | Admitting: Psychology

## 2015-10-16 DIAGNOSIS — M4722 Other spondylosis with radiculopathy, cervical region: Secondary | ICD-10-CM | POA: Diagnosis not present

## 2015-10-16 DIAGNOSIS — M47812 Spondylosis without myelopathy or radiculopathy, cervical region: Secondary | ICD-10-CM

## 2015-10-16 HISTORY — DX: Spondylosis without myelopathy or radiculopathy, cervical region: M47.812

## 2015-10-22 ENCOUNTER — Ambulatory Visit: Payer: Self-pay | Admitting: Psychology

## 2015-10-28 ENCOUNTER — Inpatient Hospital Stay: Admit: 2015-10-28 | Payer: Medicare Other | Admitting: Neurosurgery

## 2015-10-28 SURGERY — POSTERIOR CERVICAL LAMINECTOMY
Anesthesia: General | Laterality: Left

## 2015-10-29 ENCOUNTER — Ambulatory Visit (INDEPENDENT_AMBULATORY_CARE_PROVIDER_SITE_OTHER): Payer: Medicare Other | Admitting: Psychology

## 2015-10-29 DIAGNOSIS — F4321 Adjustment disorder with depressed mood: Secondary | ICD-10-CM | POA: Diagnosis not present

## 2015-11-12 ENCOUNTER — Ambulatory Visit (INDEPENDENT_AMBULATORY_CARE_PROVIDER_SITE_OTHER): Payer: Medicare Other | Admitting: Psychology

## 2015-11-12 DIAGNOSIS — F4321 Adjustment disorder with depressed mood: Secondary | ICD-10-CM | POA: Diagnosis not present

## 2015-11-21 DIAGNOSIS — G4452 New daily persistent headache (NDPH): Secondary | ICD-10-CM | POA: Diagnosis not present

## 2015-11-21 DIAGNOSIS — G3184 Mild cognitive impairment, so stated: Secondary | ICD-10-CM | POA: Diagnosis not present

## 2015-11-21 DIAGNOSIS — Z6822 Body mass index (BMI) 22.0-22.9, adult: Secondary | ICD-10-CM | POA: Diagnosis not present

## 2015-11-21 DIAGNOSIS — F4322 Adjustment disorder with anxiety: Secondary | ICD-10-CM | POA: Diagnosis not present

## 2015-11-21 DIAGNOSIS — Z1159 Encounter for screening for other viral diseases: Secondary | ICD-10-CM | POA: Diagnosis not present

## 2015-11-21 DIAGNOSIS — R918 Other nonspecific abnormal finding of lung field: Secondary | ICD-10-CM | POA: Diagnosis not present

## 2015-11-21 DIAGNOSIS — F332 Major depressive disorder, recurrent severe without psychotic features: Secondary | ICD-10-CM | POA: Diagnosis not present

## 2015-11-24 ENCOUNTER — Ambulatory Visit (INDEPENDENT_AMBULATORY_CARE_PROVIDER_SITE_OTHER): Payer: Medicare Other | Admitting: Psychology

## 2015-11-24 DIAGNOSIS — F4321 Adjustment disorder with depressed mood: Secondary | ICD-10-CM

## 2015-11-24 DIAGNOSIS — R001 Bradycardia, unspecified: Secondary | ICD-10-CM | POA: Diagnosis not present

## 2015-11-24 DIAGNOSIS — K589 Irritable bowel syndrome without diarrhea: Secondary | ICD-10-CM

## 2015-11-24 DIAGNOSIS — F331 Major depressive disorder, recurrent, moderate: Secondary | ICD-10-CM | POA: Diagnosis not present

## 2015-11-24 DIAGNOSIS — E785 Hyperlipidemia, unspecified: Secondary | ICD-10-CM | POA: Diagnosis not present

## 2015-11-24 HISTORY — DX: Irritable bowel syndrome, unspecified: K58.9

## 2015-11-26 DIAGNOSIS — M4322 Fusion of spine, cervical region: Secondary | ICD-10-CM | POA: Diagnosis not present

## 2015-11-26 DIAGNOSIS — Z7951 Long term (current) use of inhaled steroids: Secondary | ICD-10-CM | POA: Diagnosis not present

## 2015-11-26 DIAGNOSIS — T783XXA Angioneurotic edema, initial encounter: Secondary | ICD-10-CM | POA: Diagnosis not present

## 2015-11-26 DIAGNOSIS — K219 Gastro-esophageal reflux disease without esophagitis: Secondary | ICD-10-CM | POA: Diagnosis present

## 2015-11-26 DIAGNOSIS — E039 Hypothyroidism, unspecified: Secondary | ICD-10-CM | POA: Diagnosis present

## 2015-11-26 DIAGNOSIS — Z885 Allergy status to narcotic agent status: Secondary | ICD-10-CM | POA: Diagnosis not present

## 2015-11-26 DIAGNOSIS — Z79890 Hormone replacement therapy (postmenopausal): Secondary | ICD-10-CM | POA: Diagnosis not present

## 2015-11-26 DIAGNOSIS — M5031 Other cervical disc degeneration,  high cervical region: Secondary | ICD-10-CM | POA: Diagnosis not present

## 2015-11-26 DIAGNOSIS — F329 Major depressive disorder, single episode, unspecified: Secondary | ICD-10-CM | POA: Diagnosis present

## 2015-11-26 DIAGNOSIS — Z981 Arthrodesis status: Secondary | ICD-10-CM | POA: Diagnosis not present

## 2015-11-26 DIAGNOSIS — G4700 Insomnia, unspecified: Secondary | ICD-10-CM | POA: Diagnosis present

## 2015-11-26 DIAGNOSIS — F419 Anxiety disorder, unspecified: Secondary | ICD-10-CM | POA: Diagnosis present

## 2015-11-26 DIAGNOSIS — Z888 Allergy status to other drugs, medicaments and biological substances status: Secondary | ICD-10-CM | POA: Diagnosis not present

## 2015-11-26 DIAGNOSIS — M532X1 Spinal instabilities, occipito-atlanto-axial region: Secondary | ICD-10-CM | POA: Diagnosis not present

## 2015-11-26 DIAGNOSIS — K580 Irritable bowel syndrome with diarrhea: Secondary | ICD-10-CM | POA: Diagnosis present

## 2015-11-26 DIAGNOSIS — J45909 Unspecified asthma, uncomplicated: Secondary | ICD-10-CM | POA: Diagnosis present

## 2015-11-26 DIAGNOSIS — G43909 Migraine, unspecified, not intractable, without status migrainosus: Secondary | ICD-10-CM | POA: Diagnosis present

## 2015-11-26 DIAGNOSIS — M4711 Other spondylosis with myelopathy, occipito-atlanto-axial region: Secondary | ICD-10-CM | POA: Diagnosis not present

## 2015-11-26 DIAGNOSIS — M797 Fibromyalgia: Secondary | ICD-10-CM | POA: Diagnosis present

## 2015-11-26 DIAGNOSIS — E785 Hyperlipidemia, unspecified: Secondary | ICD-10-CM | POA: Diagnosis present

## 2015-11-26 DIAGNOSIS — N390 Urinary tract infection, site not specified: Secondary | ICD-10-CM | POA: Diagnosis present

## 2015-11-26 DIAGNOSIS — Z79899 Other long term (current) drug therapy: Secondary | ICD-10-CM | POA: Diagnosis not present

## 2015-11-26 DIAGNOSIS — M5011 Cervical disc disorder with radiculopathy,  high cervical region: Secondary | ICD-10-CM | POA: Diagnosis not present

## 2015-11-29 DIAGNOSIS — M5030 Other cervical disc degeneration, unspecified cervical region: Secondary | ICD-10-CM

## 2015-11-29 HISTORY — DX: Other cervical disc degeneration, unspecified cervical region: M50.30

## 2015-12-29 ENCOUNTER — Ambulatory Visit: Payer: Medicare Other | Admitting: Psychology

## 2015-12-31 DIAGNOSIS — M4322 Fusion of spine, cervical region: Secondary | ICD-10-CM | POA: Diagnosis not present

## 2015-12-31 DIAGNOSIS — Z981 Arthrodesis status: Secondary | ICD-10-CM | POA: Diagnosis not present

## 2016-01-01 ENCOUNTER — Ambulatory Visit (INDEPENDENT_AMBULATORY_CARE_PROVIDER_SITE_OTHER): Payer: Medicare Other | Admitting: Psychology

## 2016-01-01 DIAGNOSIS — F4320 Adjustment disorder, unspecified: Secondary | ICD-10-CM

## 2016-01-12 ENCOUNTER — Ambulatory Visit (INDEPENDENT_AMBULATORY_CARE_PROVIDER_SITE_OTHER): Payer: Medicare Other | Admitting: Psychology

## 2016-01-12 DIAGNOSIS — F4320 Adjustment disorder, unspecified: Secondary | ICD-10-CM | POA: Diagnosis not present

## 2016-01-20 DIAGNOSIS — N3000 Acute cystitis without hematuria: Secondary | ICD-10-CM | POA: Diagnosis not present

## 2016-01-22 DIAGNOSIS — F330 Major depressive disorder, recurrent, mild: Secondary | ICD-10-CM | POA: Diagnosis not present

## 2016-01-26 ENCOUNTER — Ambulatory Visit
Admission: RE | Admit: 2016-01-26 | Discharge: 2016-01-26 | Disposition: A | Discharge status: Home / Self Care | Payer: Medicare Other | Source: Ambulatory Visit | Attending: Allergy and Immunology | Admitting: Allergy and Immunology

## 2016-01-26 ENCOUNTER — Other Ambulatory Visit: Payer: Self-pay | Admitting: Gastroenterology

## 2016-01-26 ENCOUNTER — Other Ambulatory Visit: Payer: Self-pay | Admitting: Allergy and Immunology

## 2016-01-26 DIAGNOSIS — J4530 Mild persistent asthma, uncomplicated: Secondary | ICD-10-CM

## 2016-01-26 DIAGNOSIS — J301 Allergic rhinitis due to pollen: Secondary | ICD-10-CM | POA: Diagnosis not present

## 2016-01-26 DIAGNOSIS — J3089 Other allergic rhinitis: Secondary | ICD-10-CM | POA: Diagnosis not present

## 2016-01-26 DIAGNOSIS — Z87892 Personal history of anaphylaxis: Secondary | ICD-10-CM | POA: Diagnosis not present

## 2016-01-26 NOTE — Telephone Encounter (Signed)
Yes, please refill for one month with 5 refills Thank you

## 2016-01-26 NOTE — Telephone Encounter (Signed)
RX refilled by Dr Loletha Carrow

## 2016-01-26 NOTE — Telephone Encounter (Signed)
Last seen in April 2017. Patient requesting refills on Nexium 20 mg one a day.

## 2016-01-27 ENCOUNTER — Ambulatory Visit (INDEPENDENT_AMBULATORY_CARE_PROVIDER_SITE_OTHER): Payer: Medicare Other | Admitting: Psychology

## 2016-01-27 DIAGNOSIS — F4321 Adjustment disorder with depressed mood: Secondary | ICD-10-CM | POA: Diagnosis not present

## 2016-02-10 DIAGNOSIS — N393 Stress incontinence (female) (male): Secondary | ICD-10-CM | POA: Diagnosis not present

## 2016-02-10 DIAGNOSIS — N390 Urinary tract infection, site not specified: Secondary | ICD-10-CM | POA: Diagnosis not present

## 2016-02-11 ENCOUNTER — Ambulatory Visit (INDEPENDENT_AMBULATORY_CARE_PROVIDER_SITE_OTHER): Payer: Medicare Other | Admitting: Psychology

## 2016-02-11 DIAGNOSIS — F4321 Adjustment disorder with depressed mood: Secondary | ICD-10-CM | POA: Diagnosis not present

## 2016-02-18 DIAGNOSIS — F330 Major depressive disorder, recurrent, mild: Secondary | ICD-10-CM | POA: Diagnosis not present

## 2016-02-25 ENCOUNTER — Ambulatory Visit: Payer: Medicare Other | Admitting: Psychology

## 2016-02-25 ENCOUNTER — Ambulatory Visit (INDEPENDENT_AMBULATORY_CARE_PROVIDER_SITE_OTHER): Payer: Medicare Other | Admitting: Psychology

## 2016-02-25 DIAGNOSIS — F4321 Adjustment disorder with depressed mood: Secondary | ICD-10-CM

## 2016-03-10 ENCOUNTER — Ambulatory Visit (INDEPENDENT_AMBULATORY_CARE_PROVIDER_SITE_OTHER): Payer: Medicare Other | Admitting: Psychology

## 2016-03-10 DIAGNOSIS — F4321 Adjustment disorder with depressed mood: Secondary | ICD-10-CM

## 2016-03-17 ENCOUNTER — Ambulatory Visit (INDEPENDENT_AMBULATORY_CARE_PROVIDER_SITE_OTHER): Payer: Medicare Other | Admitting: Physician Assistant

## 2016-03-17 ENCOUNTER — Other Ambulatory Visit (INDEPENDENT_AMBULATORY_CARE_PROVIDER_SITE_OTHER): Payer: Medicare Other

## 2016-03-17 ENCOUNTER — Encounter: Payer: Self-pay | Admitting: Physician Assistant

## 2016-03-17 VITALS — BP 118/70 | HR 78 | Ht 63.0 in | Wt 135.0 lb

## 2016-03-17 DIAGNOSIS — K219 Gastro-esophageal reflux disease without esophagitis: Secondary | ICD-10-CM | POA: Diagnosis not present

## 2016-03-17 DIAGNOSIS — K580 Irritable bowel syndrome with diarrhea: Secondary | ICD-10-CM

## 2016-03-17 LAB — CBC WITH DIFFERENTIAL/PLATELET
Basophils Absolute: 0 10*3/uL (ref 0.0–0.1)
Basophils Relative: 0.6 % (ref 0.0–3.0)
Eosinophils Absolute: 0.2 10*3/uL (ref 0.0–0.7)
Eosinophils Relative: 2.7 % (ref 0.0–5.0)
HCT: 34.3 % — ABNORMAL LOW (ref 36.0–46.0)
Hemoglobin: 11.4 g/dL — ABNORMAL LOW (ref 12.0–15.0)
Lymphocytes Relative: 25.5 % (ref 12.0–46.0)
Lymphs Abs: 1.8 10*3/uL (ref 0.7–4.0)
MCHC: 33.1 g/dL (ref 30.0–36.0)
MCV: 84.6 fl (ref 78.0–100.0)
Monocytes Absolute: 0.5 10*3/uL (ref 0.1–1.0)
Monocytes Relative: 6.7 % (ref 3.0–12.0)
Neutro Abs: 4.5 10*3/uL (ref 1.4–7.7)
Neutrophils Relative %: 64.5 % (ref 43.0–77.0)
Platelets: 380 10*3/uL (ref 150.0–400.0)
RBC: 4.06 Mil/uL (ref 3.87–5.11)
RDW: 14.8 % (ref 11.5–15.5)
WBC: 7 10*3/uL (ref 4.0–10.5)

## 2016-03-17 LAB — SEDIMENTATION RATE: Sed Rate: 9 mm/hr (ref 0–30)

## 2016-03-17 LAB — C-REACTIVE PROTEIN: CRP: 0.2 mg/dL — ABNORMAL LOW (ref 0.5–20.0)

## 2016-03-17 MED ORDER — DICYCLOMINE HCL 10 MG PO CAPS: ORAL_CAPSULE | ORAL | 8 refills | Status: DC
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
## 2016-03-17 NOTE — Patient Instructions (Addendum)
Please go to the basement level to have your labs drawn and stool studies.  We sent a prescription for Bentyl 10 mg. To [facility] [address]   We have given you samples of Xifaxan 550 mg. Take 1 tab 3 times daily for 10 days.

## 2016-03-18 DIAGNOSIS — R200 Anesthesia of skin: Secondary | ICD-10-CM | POA: Diagnosis not present

## 2016-03-18 DIAGNOSIS — Z981 Arthrodesis status: Secondary | ICD-10-CM | POA: Diagnosis not present

## 2016-03-18 DIAGNOSIS — M532X2 Spinal instabilities, cervical region: Secondary | ICD-10-CM | POA: Diagnosis not present

## 2016-03-18 DIAGNOSIS — Z23 Encounter for immunization: Secondary | ICD-10-CM | POA: Diagnosis not present

## 2016-03-18 DIAGNOSIS — M542 Cervicalgia: Secondary | ICD-10-CM | POA: Diagnosis not present

## 2016-03-18 DIAGNOSIS — M5412 Radiculopathy, cervical region: Secondary | ICD-10-CM | POA: Diagnosis not present

## 2016-03-18 DIAGNOSIS — Z4789 Encounter for other orthopedic aftercare: Secondary | ICD-10-CM | POA: Diagnosis not present

## 2016-03-19 ENCOUNTER — Other Ambulatory Visit: Payer: Self-pay

## 2016-03-19 ENCOUNTER — Other Ambulatory Visit: Payer: Medicare Other

## 2016-03-19 DIAGNOSIS — D508 Other iron deficiency anemias: Secondary | ICD-10-CM

## 2016-03-19 DIAGNOSIS — K580 Irritable bowel syndrome with diarrhea: Secondary | ICD-10-CM

## 2016-03-21 NOTE — Progress Notes (Signed)
Reviewed and agree with documentation and assessment and plan. K. Veena Nandigam , MD   

## 2016-03-22 LAB — GASTROINTESTINAL PATHOGEN PANEL PCR
C. difficile Tox A/B, PCR: NOT DETECTED
Campylobacter, PCR: NOT DETECTED
Cryptosporidium, PCR: NOT DETECTED
E coli (ETEC) LT/ST PCR: NOT DETECTED
E coli (STEC) stx1/stx2, PCR: NOT DETECTED
E coli 0157, PCR: NOT DETECTED
Giardia lamblia, PCR: NOT DETECTED
Norovirus, PCR: NOT DETECTED
Rotavirus A, PCR: NOT DETECTED
Salmonella, PCR: NOT DETECTED
Shigella, PCR: NOT DETECTED

## 2016-03-22 LAB — FECAL LACTOFERRIN, QUANT: Lactoferrin: NEGATIVE

## 2016-03-24 ENCOUNTER — Ambulatory Visit (INDEPENDENT_AMBULATORY_CARE_PROVIDER_SITE_OTHER): Payer: Medicare Other | Admitting: Psychology

## 2016-03-24 DIAGNOSIS — F4321 Adjustment disorder with depressed mood: Secondary | ICD-10-CM

## 2016-03-31 ENCOUNTER — Ambulatory Visit: Payer: Self-pay | Admitting: Neurology

## 2016-04-05 ENCOUNTER — Ambulatory Visit: Payer: Medicare Other | Admitting: Psychology

## 2016-04-06 ENCOUNTER — Telehealth: Payer: Self-pay | Admitting: Family Medicine

## 2016-04-06 NOTE — Telephone Encounter (Signed)
Pt called in said that she seen you out and you would take her on as a new pt?  Is this ok?

## 2016-04-06 NOTE — Telephone Encounter (Signed)
Unfortunately, I'm not able to accept any more new patients at this time.  I'm sorry! Thank you!  

## 2016-04-12 ENCOUNTER — Other Ambulatory Visit (INDEPENDENT_AMBULATORY_CARE_PROVIDER_SITE_OTHER): Payer: Medicare Other

## 2016-04-12 ENCOUNTER — Ambulatory Visit: Payer: Medicare Other | Admitting: Nurse Practitioner

## 2016-04-12 DIAGNOSIS — D508 Other iron deficiency anemias: Secondary | ICD-10-CM

## 2016-04-12 LAB — CBC WITH DIFFERENTIAL/PLATELET
Basophils Absolute: 0.1 10*3/uL (ref 0.0–0.1)
Basophils Relative: 0.9 % (ref 0.0–3.0)
Eosinophils Absolute: 0.2 10*3/uL (ref 0.0–0.7)
Eosinophils Relative: 3.3 % (ref 0.0–5.0)
HCT: 36.5 % (ref 36.0–46.0)
Hemoglobin: 11.9 g/dL — ABNORMAL LOW (ref 12.0–15.0)
Lymphocytes Relative: 28 % (ref 12.0–46.0)
Lymphs Abs: 2 10*3/uL (ref 0.7–4.0)
MCHC: 32.7 g/dL (ref 30.0–36.0)
MCV: 83.6 fl (ref 78.0–100.0)
Monocytes Absolute: 0.5 10*3/uL (ref 0.1–1.0)
Monocytes Relative: 7.3 % (ref 3.0–12.0)
Neutro Abs: 4.4 10*3/uL (ref 1.4–7.7)
Neutrophils Relative %: 60.5 % (ref 43.0–77.0)
Platelets: 381 10*3/uL (ref 150.0–400.0)
RBC: 4.37 Mil/uL (ref 3.87–5.11)
RDW: 16.2 % — ABNORMAL HIGH (ref 11.5–15.5)
WBC: 7.2 10*3/uL (ref 4.0–10.5)

## 2016-04-12 LAB — IRON AND TIBC
%SAT: 9 % — ABNORMAL LOW (ref 11–50)
Iron: 42 ug/dL — ABNORMAL LOW (ref 45–160)
TIBC: 452 ug/dL — ABNORMAL HIGH (ref 250–450)
UIBC: 410 ug/dL — ABNORMAL HIGH (ref 125–400)

## 2016-04-12 LAB — FERRITIN: Ferritin: 8.9 ng/mL — ABNORMAL LOW (ref 10.0–291.0)

## 2016-04-14 ENCOUNTER — Other Ambulatory Visit: Payer: Self-pay

## 2016-04-14 ENCOUNTER — Ambulatory Visit (INDEPENDENT_AMBULATORY_CARE_PROVIDER_SITE_OTHER): Payer: Medicare Other | Admitting: Psychology

## 2016-04-14 DIAGNOSIS — F4321 Adjustment disorder with depressed mood: Secondary | ICD-10-CM

## 2016-04-14 MED ORDER — POLYSACCHARIDE IRON COMPLEX 150 MG PO CAPS: 150.0000 mg | ORAL_CAPSULE | Freq: Two times a day (BID) | ORAL | 6 refills | Status: DC

## 2016-05-03 ENCOUNTER — Ambulatory Visit (AMBULATORY_SURGERY_CENTER): Payer: Self-pay | Admitting: *Deleted

## 2016-05-03 VITALS — Ht 63.0 in | Wt 140.0 lb

## 2016-05-03 DIAGNOSIS — N3000 Acute cystitis without hematuria: Secondary | ICD-10-CM | POA: Diagnosis not present

## 2016-05-03 DIAGNOSIS — D509 Iron deficiency anemia, unspecified: Secondary | ICD-10-CM

## 2016-05-03 MED ORDER — NA SULFATE-K SULFATE-MG SULF 17.5-3.13-1.6 GM/177ML PO SOLN: ORAL | 0 refills | Status: DC

## 2016-05-03 NOTE — Progress Notes (Signed)
Patient denies any allergies to eggs or soy. Patient denies any problems with anesthesia/sedation. Patient denies any oxygen use at home and does not take any diet/weight loss medications. Patient declined EMMI education.

## 2016-05-05 ENCOUNTER — Encounter: Payer: Self-pay | Admitting: Internal Medicine

## 2016-05-05 ENCOUNTER — Ambulatory Visit (INDEPENDENT_AMBULATORY_CARE_PROVIDER_SITE_OTHER): Payer: Medicare Other | Admitting: Internal Medicine

## 2016-05-05 ENCOUNTER — Other Ambulatory Visit (INDEPENDENT_AMBULATORY_CARE_PROVIDER_SITE_OTHER): Payer: Medicare Other

## 2016-05-05 VITALS — BP 110/80 | HR 69 | Ht 63.0 in | Wt 139.0 lb

## 2016-05-05 DIAGNOSIS — M797 Fibromyalgia: Secondary | ICD-10-CM

## 2016-05-05 DIAGNOSIS — K580 Irritable bowel syndrome with diarrhea: Secondary | ICD-10-CM

## 2016-05-05 DIAGNOSIS — C499 Malignant neoplasm of connective and soft tissue, unspecified: Secondary | ICD-10-CM

## 2016-05-05 DIAGNOSIS — Z23 Encounter for immunization: Secondary | ICD-10-CM | POA: Diagnosis not present

## 2016-05-05 DIAGNOSIS — R202 Paresthesia of skin: Secondary | ICD-10-CM

## 2016-05-05 DIAGNOSIS — F332 Major depressive disorder, recurrent severe without psychotic features: Secondary | ICD-10-CM

## 2016-05-05 DIAGNOSIS — E559 Vitamin D deficiency, unspecified: Secondary | ICD-10-CM

## 2016-05-05 DIAGNOSIS — E039 Hypothyroidism, unspecified: Secondary | ICD-10-CM

## 2016-05-05 DIAGNOSIS — C52 Malignant neoplasm of vagina: Secondary | ICD-10-CM

## 2016-05-05 DIAGNOSIS — F329 Major depressive disorder, single episode, unspecified: Secondary | ICD-10-CM

## 2016-05-05 DIAGNOSIS — J069 Acute upper respiratory infection, unspecified: Secondary | ICD-10-CM

## 2016-05-05 DIAGNOSIS — K219 Gastro-esophageal reflux disease without esophagitis: Secondary | ICD-10-CM

## 2016-05-05 DIAGNOSIS — F32A Depression, unspecified: Secondary | ICD-10-CM

## 2016-05-05 DIAGNOSIS — M542 Cervicalgia: Secondary | ICD-10-CM

## 2016-05-05 DIAGNOSIS — F0781 Postconcussional syndrome: Secondary | ICD-10-CM

## 2016-05-05 DIAGNOSIS — N390 Urinary tract infection, site not specified: Secondary | ICD-10-CM

## 2016-05-05 HISTORY — DX: Acute upper respiratory infection, unspecified: J06.9

## 2016-05-05 LAB — URINALYSIS, ROUTINE W REFLEX MICROSCOPIC
Bilirubin Urine: NEGATIVE
Hgb urine dipstick: NEGATIVE
Ketones, ur: NEGATIVE
Nitrite: POSITIVE — AB
Specific Gravity, Urine: 1.02 (ref 1.000–1.030)
Total Protein, Urine: NEGATIVE
Urine Glucose: NEGATIVE
Urobilinogen, UA: 0.2 (ref 0.0–1.0)
pH: 6 (ref 5.0–8.0)

## 2016-05-05 LAB — HEPATIC FUNCTION PANEL
ALT: 13 U/L (ref 0–35)
AST: 19 U/L (ref 0–37)
Albumin: 3.9 g/dL (ref 3.5–5.2)
Alkaline Phosphatase: 99 U/L (ref 39–117)
Bilirubin, Direct: 0 mg/dL (ref 0.0–0.3)
Total Bilirubin: 0.2 mg/dL (ref 0.2–1.2)
Total Protein: 6.8 g/dL (ref 6.0–8.3)

## 2016-05-05 LAB — CBC WITH DIFFERENTIAL/PLATELET
Basophils Absolute: 0.1 10*3/uL (ref 0.0–0.1)
Basophils Relative: 1 % (ref 0.0–3.0)
Eosinophils Absolute: 0.3 10*3/uL (ref 0.0–0.7)
Eosinophils Relative: 3.7 % (ref 0.0–5.0)
HCT: 35 % — ABNORMAL LOW (ref 36.0–46.0)
Hemoglobin: 11.7 g/dL — ABNORMAL LOW (ref 12.0–15.0)
Lymphocytes Relative: 20.8 % (ref 12.0–46.0)
Lymphs Abs: 1.5 10*3/uL (ref 0.7–4.0)
MCHC: 33.5 g/dL (ref 30.0–36.0)
MCV: 83.7 fl (ref 78.0–100.0)
Monocytes Absolute: 0.6 10*3/uL (ref 0.1–1.0)
Monocytes Relative: 7.7 % (ref 3.0–12.0)
Neutro Abs: 4.9 10*3/uL (ref 1.4–7.7)
Neutrophils Relative %: 66.8 % (ref 43.0–77.0)
Platelets: 361 10*3/uL (ref 150.0–400.0)
RBC: 4.18 Mil/uL (ref 3.87–5.11)
RDW: 16.5 % — ABNORMAL HIGH (ref 11.5–15.5)
WBC: 7.4 10*3/uL (ref 4.0–10.5)

## 2016-05-05 LAB — LDL CHOLESTEROL, DIRECT: Direct LDL: 161 mg/dL

## 2016-05-05 LAB — BASIC METABOLIC PANEL
BUN: 21 mg/dL (ref 6–23)
CO2: 25 mEq/L (ref 19–32)
Calcium: 9.9 mg/dL (ref 8.4–10.5)
Chloride: 110 mEq/L (ref 96–112)
Creatinine, Ser: 1.33 mg/dL — ABNORMAL HIGH (ref 0.40–1.20)
GFR: 42.39 mL/min — ABNORMAL LOW (ref >60.00)
Glucose, Bld: 70 mg/dL (ref 70–99)
Potassium: 3.9 mEq/L (ref 3.5–5.1)
Sodium: 141 mEq/L (ref 135–145)

## 2016-05-05 LAB — LIPID PANEL
Cholesterol: 249 mg/dL — ABNORMAL HIGH (ref 0–200)
HDL: 54.2 mg/dL (ref >39.00)
NonHDL: 194.64
Total CHOL/HDL Ratio: 5
Triglycerides: 213 mg/dL — ABNORMAL HIGH (ref 0.0–149.0)
VLDL: 42.6 mg/dL — ABNORMAL HIGH (ref 0.0–40.0)

## 2016-05-05 LAB — VITAMIN B12: Vitamin B-12: 253 pg/mL (ref 211–911)

## 2016-05-05 LAB — TSH: TSH: 2.28 u[IU]/mL (ref 0.35–4.50)

## 2016-05-05 MED ORDER — VITAMIN B-12 1000 MCG SL SUBL: 1.0000 | SUBLINGUAL_TABLET | Freq: Every day | SUBLINGUAL | 3 refills | Status: DC

## 2016-05-05 MED ORDER — AMOXICILLIN 875 MG PO TABS: 875.0000 mg | ORAL_TABLET | Freq: Two times a day (BID) | ORAL | 0 refills | Status: DC

## 2016-05-05 NOTE — Assessment & Plan Note (Signed)
Vaginal wall - rare tumor 12/12 Dr Matthew Saras S/p chemo

## 2016-05-05 NOTE — Assessment & Plan Note (Signed)
On Fluoxetine  

## 2016-05-05 NOTE — Assessment & Plan Note (Signed)
Dr Olevia Perches

## 2016-05-05 NOTE — Assessment & Plan Note (Signed)
2017 fusion in [place] by Dr Redmond Pulling

## 2016-05-05 NOTE — Assessment & Plan Note (Signed)
In yoga training

## 2016-05-05 NOTE — Assessment & Plan Note (Signed)
On Vit D 

## 2016-05-05 NOTE — Assessment & Plan Note (Signed)
Amoxicillin x10d
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
## 2016-05-05 NOTE — Assessment & Plan Note (Signed)
Yoga is helping Fluoxetine

## 2016-05-05 NOTE — Assessment & Plan Note (Signed)
Protonix.  ?

## 2016-05-05 NOTE — Assessment & Plan Note (Signed)
Amoxicillin

## 2016-05-05 NOTE — Assessment & Plan Note (Signed)
Leiomyosarcoma 2012 s/p surgery and chemo

## 2016-05-05 NOTE — Assessment & Plan Note (Signed)
On Levothroid 
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
## 2016-05-05 NOTE — Progress Notes (Signed)
Pre visit review using our clinic review tool, if applicable. No additional management support is needed unless otherwise documented below in the visit note. 

## 2016-05-06 LAB — HEPATITIS C ANTIBODY: HCV Ab: NEGATIVE

## 2016-05-12 ENCOUNTER — Encounter: Payer: Self-pay | Admitting: Gastroenterology

## 2016-05-12 ENCOUNTER — Ambulatory Visit (AMBULATORY_SURGERY_CENTER): Payer: Medicare Other | Admitting: Gastroenterology

## 2016-05-12 VITALS — BP 139/73 | HR 55 | Temp 97.1°F | Resp 11 | Ht 63.0 in | Wt 139.0 lb

## 2016-05-12 DIAGNOSIS — B3781 Candidal esophagitis: Secondary | ICD-10-CM | POA: Diagnosis not present

## 2016-05-12 DIAGNOSIS — F319 Bipolar disorder, unspecified: Secondary | ICD-10-CM | POA: Diagnosis not present

## 2016-05-12 DIAGNOSIS — F329 Major depressive disorder, single episode, unspecified: Secondary | ICD-10-CM | POA: Diagnosis not present

## 2016-05-12 DIAGNOSIS — K209 Esophagitis, unspecified: Secondary | ICD-10-CM | POA: Diagnosis not present

## 2016-05-12 DIAGNOSIS — D509 Iron deficiency anemia, unspecified: Secondary | ICD-10-CM

## 2016-05-12 DIAGNOSIS — D508 Other iron deficiency anemias: Secondary | ICD-10-CM

## 2016-05-12 MED ORDER — SODIUM CHLORIDE 0.9 % IV SOLN: 500.0000 mL | INTRAVENOUS | Status: DC

## 2016-05-12 MED ORDER — FLUCONAZOLE 100 MG PO TABS: 100.0000 mg | ORAL_TABLET | Freq: Every day | ORAL | 0 refills | Status: DC

## 2016-05-12 NOTE — Patient Instructions (Signed)
YOU HAD AN ENDOSCOPIC PROCEDURE TODAY AT [facility]:   Refer to the procedure report that was given to you for any specific questions about what was found during the examination.  If the procedure report does not answer your questions, please call your gastroenterologist to clarify.  If you requested that your care partner not be given the details of your procedure findings, then the procedure report has been included in a sealed envelope for you to review at your convenience later.  YOU SHOULD EXPECT: Some feelings of bloating in the abdomen. Passage of more gas than usual.  Walking can help get rid of the air that was put into your GI tract during the procedure and reduce the bloating. If you had a lower endoscopy (such as a colonoscopy or flexible sigmoidoscopy) you may notice spotting of blood in your stool or on the toilet paper. If you underwent a bowel prep for your procedure, you may not have a normal bowel movement for a few days.  Please Note:  You might notice some irritation and congestion in your nose or some drainage.  This is from the oxygen used during your procedure.  There is no need for concern and it should clear up in a day or so.  SYMPTOMS TO REPORT IMMEDIATELY:   Following lower endoscopy (colonoscopy or flexible sigmoidoscopy):  Excessive amounts of blood in the stool  Significant tenderness or worsening of abdominal pains  Swelling of the abdomen that is new, acute  Fever of 100F or higher   Following upper endoscopy (EGD)  Vomiting of blood or coffee ground material  New chest pain or pain under the shoulder blades  Painful or persistently difficult swallowing  New shortness of breath  Fever of 100F or higher  Black, tarry-looking stools  For urgent or emergent issues, a gastroenterologist can be reached at any hour by calling [phone].   DIET:  We do recommend a small meal at first, but then you may proceed to your regular diet.  Drink  plenty of fluids but you should avoid alcoholic beverages for 24 hours.  ACTIVITY:  You should plan to take it easy for the rest of today and you should NOT DRIVE or use heavy machinery until tomorrow (because of the sedation medicines used during the test).    FOLLOW UP: Our staff will call the number listed on your records the next business day following your procedure to check on you and address any questions or concerns that you may have regarding the information given to you following your procedure. If we do not reach you, we will leave a message.  However, if you are feeling well and you are not experiencing any problems, there is no need to return our call.  We will assume that you have returned to your regular daily activities without incident.  If any biopsies were taken you will be contacted by phone or by letter within the next 1-3 weeks.  Please call us at [phone] if you have not heard about the biopsies in 3 weeks.    SIGNATURES/CONFIDENTIALITY: You and/or your care partner have signed paperwork which will be entered into your electronic medical record.  These signatures attest to the fact that that the information above on your After Visit Summary has been reviewed and is understood.  Full responsibility of the confidentiality of this discharge information lies with you and/or your care-partner.    Handouts were given to your care partner on  Diverticulosis, hemorrhoids, and Hope handout. Rx was sent to [facility] for DIFLUCAN 100 mg by mouth daily for 10 days. You may resume your current medications today. Await biopsy results. Repeat colonoscopy in 10 years for screening purposes. To visualize to small bowel, perform video capsule endoscopy at the next available appointment to further evaluate iron deficiency anemia. Please call if any questions or concerns.

## 2016-05-12 NOTE — Progress Notes (Signed)
No problems noted in the recovery room. maw 

## 2016-05-12 NOTE — Op Note (Signed)
[facility] Patient Name: [name] Procedure Date: 05/12/2016 3:04 PM MRN: [id number] Endoscopist: Mauri Pole , MD Age: 65 Referring MD:  Date of Birth: May 04, 1951 Gender: Female Account #: [id number] Procedure:                Upper GI endoscopy Indications:              Suspected upper gastrointestinal bleeding in                            patient with unexplained iron deficiency anemia Medicines:                Monitored Anesthesia Care Procedure:                Pre-Anesthesia Assessment:                           - Prior to the procedure, a History and Physical                            was performed, and patient medications and                            allergies were reviewed. The patient's tolerance of                            previous anesthesia was also reviewed. The risks                            and benefits of the procedure and the sedation                            options and risks were discussed with the patient.                            All questions were answered, and informed consent                            was obtained. Prior Anticoagulants: The patient has                            taken no previous anticoagulant or antiplatelet                            agents. ASA Grade Assessment: II - A patient with                            mild systemic disease. After reviewing the risks                            and benefits, the patient was deemed in                            satisfactory condition to undergo the procedure.  After obtaining informed consent, the endoscope was                            passed under direct vision. Throughout the                            procedure, the patient's blood pressure, pulse, and                            oxygen saturations were monitored continuously. The                            Model GIF-HQ190 [id number]) scope was introduced                            through  the mouth, and advanced to the second part                            of duodenum. The upper GI endoscopy was                            accomplished without difficulty. The patient                            tolerated the procedure well. Scope In: Scope Out: Findings:                 Patchy candidiasis was found in the upper third of                            the esophagus, in the middle third of the esophagus                            and in the lower third of the esophagus. Biopsies                            were taken with a cold forceps for histology. No                            evidence of esophageal stricture                           The stomach was normal.                           The cardia and gastric fundus were normal on                            retroflexion.                           The examined duodenum was normal.                           The exam was otherwise without abnormality. Complications:  No immediate complications. Estimated Blood Loss:     Estimated blood loss was minimal. Impression:               - Monilial esophagitis. Biopsied.                           - Normal stomach.                           - Normal examined duodenum.                           - The examination was otherwise normal. Recommendation:           - Resume previous diet.                           - Continue present medications.                           - Await pathology results.                           - Diflucan (fluconazole) 100 mg PO daily for 10                            days. Mauri Pole, MD 05/12/2016 3:57:59 PM This report has been signed electronically.

## 2016-05-12 NOTE — Op Note (Signed)
[facility] Patient Name: [name] Procedure Date: 05/12/2016 3:04 PM MRN: [id number] Endoscopist: Mauri Pole , MD Age: 65 Referring MD:  Date of Birth: 21-May-1950 Gender: Female Account #: [id number] Procedure:                Colonoscopy Indications:              Unexplained iron deficiency anemia Medicines:                Monitored Anesthesia Care Procedure:                Pre-Anesthesia Assessment:                           - Prior to the procedure, a History and Physical                            was performed, and patient medications and                            allergies were reviewed. The patient's tolerance of                            previous anesthesia was also reviewed. The risks                            and benefits of the procedure and the sedation                            options and risks were discussed with the patient.                            All questions were answered, and informed consent                            was obtained. Prior Anticoagulants: The patient has                            taken no previous anticoagulant or antiplatelet                            agents. ASA Grade Assessment: II - A patient with                            mild systemic disease. After reviewing the risks                            and benefits, the patient was deemed in                            satisfactory condition to undergo the procedure.                           - Prior to the procedure, a History and Physical  was performed, and patient medications and                            allergies were reviewed. The patient's tolerance of                            previous anesthesia was also reviewed. The risks                            and benefits of the procedure and the sedation                            options and risks were discussed with the patient.                            All questions were answered, and  informed consent                            was obtained. Prior Anticoagulants: The patient has                            taken no previous anticoagulant or antiplatelet                            agents. ASA Grade Assessment: II - A patient with                            mild systemic disease. After reviewing the risks                            and benefits, the patient was deemed in                            satisfactory condition to undergo the procedure.                           After obtaining informed consent, the colonoscope                            was passed under direct vision. Throughout the                            procedure, the patient's blood pressure, pulse, and                            oxygen saturations were monitored continuously. The                            Model PCF-H190L [id number]) scope was introduced                            through the anus and advanced to the the terminal  ileum, with identification of the appendiceal                            orifice and IC valve. The colonoscopy was performed                            without difficulty. The patient tolerated the                            procedure well. The quality of the bowel                            preparation was excellent. The terminal ileum,                            ileocecal valve, appendiceal orifice, and rectum                            were photographed. Scope In: 3:23:46 PM Scope Out: 3:41:09 PM Scope Withdrawal Time: 0 hours 10 minutes 49 seconds  Total Procedure Duration: 0 hours 17 minutes 23 seconds  Findings:                 The perianal and digital rectal examinations were                            normal.                           A few small-mouthed diverticula were found in the                            sigmoid colon.                           Non-bleeding internal hemorrhoids were found during                            retroflexion.  The hemorrhoids were large.                           The exam was otherwise without abnormality. Complications:            No immediate complications. Estimated Blood Loss:     Estimated blood loss: none. Impression:               - Diverticulosis in the sigmoid colon.                           - Non-bleeding internal hemorrhoids.                           - The examination was otherwise normal.                           - No specimens collected. Recommendation:           - Patient has a contact number available  for                            emergencies. The signs and symptoms of potential                            delayed complications were discussed with the                            patient. Return to normal activities tomorrow.                            Written discharge instructions were provided to the                            patient.                           - Resume previous diet.                           - Continue present medications.                           - Repeat colonoscopy in 10 years for screening                            purposes.                           - To visualize the small bowel, perform video                            capsule endoscopy at the next available appointment                            to further evaluate iron deficiency anemia. Mauri Pole, MD 05/12/2016 4:00:16 PM This report has been signed electronically.

## 2016-05-12 NOTE — Progress Notes (Signed)
To PACU  Awake and alert.  Report to RN 

## 2016-05-13 ENCOUNTER — Telehealth: Payer: Self-pay | Admitting: *Deleted

## 2016-05-13 NOTE — Telephone Encounter (Signed)
  Follow up Call-  Call back number 05/12/2016  Post procedure Call Back phone  # [phone]  Permission to leave phone message Yes  Some recent data might be hidden     Patient questions:  Do you have a fever, pain , or abdominal swelling? No. Pain Score  0 *  Have you tolerated food without any problems? Yes.    Have you been able to return to your normal activities? Yes.    Do you have any questions about your discharge instructions: Diet   No. Medications  No. Follow up visit  No.  Do you have questions or concerns about your Care? No.  Actions: * If pain score is 4 or above: No action needed, pain <4.

## 2016-05-26 ENCOUNTER — Telehealth: Payer: Self-pay | Admitting: Gastroenterology

## 2016-05-26 ENCOUNTER — Encounter: Payer: Self-pay | Admitting: Gastroenterology

## 2016-05-26 ENCOUNTER — Ambulatory Visit: Payer: Self-pay | Admitting: Gastroenterology

## 2016-05-26 NOTE — Telephone Encounter (Signed)
Patient agrees to do this study 06/10/16. She will come in tomorrow and bring her new insurance card with her.

## 2016-05-27 ENCOUNTER — Other Ambulatory Visit: Payer: Self-pay

## 2016-05-28 ENCOUNTER — Telehealth: Payer: Self-pay | Admitting: Internal Medicine

## 2016-05-28 NOTE — Telephone Encounter (Signed)
Spoke with pt, needed to reschedule appt d/t not being able to make 2/9 appt with MR.  Pt refers to see MR only. appt rescheduled to 2/13 to accommodate her schedule.  Nothing further needed at this time.

## 2016-05-31 ENCOUNTER — Other Ambulatory Visit: Payer: Self-pay

## 2016-05-31 DIAGNOSIS — D500 Iron deficiency anemia secondary to blood loss (chronic): Secondary | ICD-10-CM

## 2016-06-07 ENCOUNTER — Ambulatory Visit: Payer: Self-pay | Admitting: Psychology

## 2016-06-15 DIAGNOSIS — K219 Gastro-esophageal reflux disease without esophagitis: Secondary | ICD-10-CM

## 2016-06-15 HISTORY — DX: Gastro-esophageal reflux disease without esophagitis: K21.9

## 2016-06-17 HISTORY — PX: BLADDER SURGERY: SHX569

## 2016-06-22 ENCOUNTER — Other Ambulatory Visit: Payer: Self-pay | Admitting: Gynecologic Oncology

## 2016-06-22 DIAGNOSIS — C499 Malignant neoplasm of connective and soft tissue, unspecified: Secondary | ICD-10-CM

## 2016-06-22 DIAGNOSIS — Z779 Other contact with and (suspected) exposures hazardous to health: Secondary | ICD-10-CM | POA: Diagnosis not present

## 2016-06-22 DIAGNOSIS — Z6824 Body mass index (BMI) 24.0-24.9, adult: Secondary | ICD-10-CM | POA: Diagnosis not present

## 2016-06-22 DIAGNOSIS — Z01419 Encounter for gynecological examination (general) (routine) without abnormal findings: Secondary | ICD-10-CM | POA: Diagnosis not present

## 2016-06-22 DIAGNOSIS — D509 Iron deficiency anemia, unspecified: Secondary | ICD-10-CM | POA: Diagnosis not present

## 2016-06-25 ENCOUNTER — Institutional Professional Consult (permissible substitution): Payer: Self-pay | Admitting: Internal Medicine

## 2016-06-29 ENCOUNTER — Institutional Professional Consult (permissible substitution): Payer: Self-pay | Admitting: Internal Medicine

## 2016-07-01 ENCOUNTER — Other Ambulatory Visit: Payer: Self-pay | Admitting: *Deleted

## 2016-07-01 MED ORDER — FEXOFENADINE HCL 180 MG PO TABS: 180.0000 mg | ORAL_TABLET | Freq: Every day | ORAL | 2 refills | Status: DC

## 2016-07-01 MED ORDER — LEVOTHYROXINE SODIUM 100 MCG PO TABS: 100.0000 ug | ORAL_TABLET | Freq: Every day | ORAL | 2 refills | Status: DC

## 2016-07-01 MED ORDER — FLUOXETINE HCL 40 MG PO CAPS: 40.0000 mg | ORAL_CAPSULE | Freq: Every morning | ORAL | 2 refills | Status: DC

## 2016-07-01 MED ORDER — TOPIRAMATE 50 MG PO TABS: 50.0000 mg | ORAL_TABLET | Freq: Every day | ORAL | 2 refills | Status: DC

## 2016-07-01 MED ORDER — MIRTAZAPINE 30 MG PO TABS: 30.0000 mg | ORAL_TABLET | Freq: Every day | ORAL | 2 refills | Status: DC

## 2016-07-01 NOTE — Telephone Encounter (Signed)
rec'd call pt states she want to use her mail service top get her maintenance meds. Verified which service pt uses inform will send all scripts to [facility]. Sent electronically...[name]

## 2016-07-08 DIAGNOSIS — N3000 Acute cystitis without hematuria: Secondary | ICD-10-CM | POA: Diagnosis not present

## 2016-07-12 DIAGNOSIS — K219 Gastro-esophageal reflux disease without esophagitis: Secondary | ICD-10-CM | POA: Diagnosis not present

## 2016-07-12 DIAGNOSIS — Z981 Arthrodesis status: Secondary | ICD-10-CM | POA: Diagnosis not present

## 2016-07-12 DIAGNOSIS — Z885 Allergy status to narcotic agent status: Secondary | ICD-10-CM | POA: Diagnosis not present

## 2016-07-12 DIAGNOSIS — K589 Irritable bowel syndrome without diarrhea: Secondary | ICD-10-CM | POA: Diagnosis not present

## 2016-07-12 DIAGNOSIS — Z7951 Long term (current) use of inhaled steroids: Secondary | ICD-10-CM | POA: Diagnosis not present

## 2016-07-12 DIAGNOSIS — Z91048 Other nonmedicinal substance allergy status: Secondary | ICD-10-CM | POA: Diagnosis not present

## 2016-07-12 DIAGNOSIS — E039 Hypothyroidism, unspecified: Secondary | ICD-10-CM | POA: Diagnosis not present

## 2016-07-12 DIAGNOSIS — M797 Fibromyalgia: Secondary | ICD-10-CM | POA: Diagnosis not present

## 2016-07-12 DIAGNOSIS — Z888 Allergy status to other drugs, medicaments and biological substances status: Secondary | ICD-10-CM | POA: Diagnosis not present

## 2016-07-12 DIAGNOSIS — E785 Hyperlipidemia, unspecified: Secondary | ICD-10-CM | POA: Diagnosis not present

## 2016-07-12 DIAGNOSIS — Z79899 Other long term (current) drug therapy: Secondary | ICD-10-CM | POA: Diagnosis not present

## 2016-07-12 DIAGNOSIS — G43909 Migraine, unspecified, not intractable, without status migrainosus: Secondary | ICD-10-CM | POA: Diagnosis not present

## 2016-07-12 DIAGNOSIS — Z79890 Hormone replacement therapy (postmenopausal): Secondary | ICD-10-CM | POA: Diagnosis not present

## 2016-07-13 DIAGNOSIS — Z79899 Other long term (current) drug therapy: Secondary | ICD-10-CM | POA: Diagnosis not present

## 2016-07-13 DIAGNOSIS — K589 Irritable bowel syndrome without diarrhea: Secondary | ICD-10-CM | POA: Diagnosis not present

## 2016-07-13 DIAGNOSIS — G43909 Migraine, unspecified, not intractable, without status migrainosus: Secondary | ICD-10-CM | POA: Diagnosis not present

## 2016-07-13 DIAGNOSIS — Z888 Allergy status to other drugs, medicaments and biological substances status: Secondary | ICD-10-CM | POA: Diagnosis not present

## 2016-07-13 DIAGNOSIS — E039 Hypothyroidism, unspecified: Secondary | ICD-10-CM | POA: Diagnosis not present

## 2016-07-13 DIAGNOSIS — M797 Fibromyalgia: Secondary | ICD-10-CM | POA: Diagnosis not present

## 2016-07-13 DIAGNOSIS — E785 Hyperlipidemia, unspecified: Secondary | ICD-10-CM | POA: Diagnosis not present

## 2016-07-13 DIAGNOSIS — Z981 Arthrodesis status: Secondary | ICD-10-CM | POA: Diagnosis not present

## 2016-07-13 DIAGNOSIS — Z91048 Other nonmedicinal substance allergy status: Secondary | ICD-10-CM | POA: Diagnosis not present

## 2016-07-13 DIAGNOSIS — Z7951 Long term (current) use of inhaled steroids: Secondary | ICD-10-CM | POA: Diagnosis not present

## 2016-07-13 DIAGNOSIS — Z79890 Hormone replacement therapy (postmenopausal): Secondary | ICD-10-CM | POA: Diagnosis not present

## 2016-07-13 DIAGNOSIS — K219 Gastro-esophageal reflux disease without esophagitis: Secondary | ICD-10-CM | POA: Diagnosis not present

## 2016-07-13 DIAGNOSIS — Z885 Allergy status to narcotic agent status: Secondary | ICD-10-CM | POA: Diagnosis not present

## 2016-07-14 DIAGNOSIS — Z7951 Long term (current) use of inhaled steroids: Secondary | ICD-10-CM | POA: Diagnosis not present

## 2016-07-14 DIAGNOSIS — E785 Hyperlipidemia, unspecified: Secondary | ICD-10-CM | POA: Diagnosis not present

## 2016-07-14 DIAGNOSIS — Z981 Arthrodesis status: Secondary | ICD-10-CM | POA: Diagnosis not present

## 2016-07-14 DIAGNOSIS — Z885 Allergy status to narcotic agent status: Secondary | ICD-10-CM | POA: Diagnosis not present

## 2016-07-14 DIAGNOSIS — M797 Fibromyalgia: Secondary | ICD-10-CM | POA: Diagnosis not present

## 2016-07-14 DIAGNOSIS — G43909 Migraine, unspecified, not intractable, without status migrainosus: Secondary | ICD-10-CM | POA: Diagnosis not present

## 2016-07-14 DIAGNOSIS — Z79890 Hormone replacement therapy (postmenopausal): Secondary | ICD-10-CM | POA: Diagnosis not present

## 2016-07-14 DIAGNOSIS — K219 Gastro-esophageal reflux disease without esophagitis: Secondary | ICD-10-CM | POA: Diagnosis not present

## 2016-07-14 DIAGNOSIS — Z91048 Other nonmedicinal substance allergy status: Secondary | ICD-10-CM | POA: Diagnosis not present

## 2016-07-14 DIAGNOSIS — E039 Hypothyroidism, unspecified: Secondary | ICD-10-CM | POA: Diagnosis not present

## 2016-07-14 DIAGNOSIS — K589 Irritable bowel syndrome without diarrhea: Secondary | ICD-10-CM | POA: Diagnosis not present

## 2016-07-14 DIAGNOSIS — Z888 Allergy status to other drugs, medicaments and biological substances status: Secondary | ICD-10-CM | POA: Diagnosis not present

## 2016-07-14 DIAGNOSIS — Z79899 Other long term (current) drug therapy: Secondary | ICD-10-CM | POA: Diagnosis not present

## 2016-07-19 DIAGNOSIS — R339 Retention of urine, unspecified: Secondary | ICD-10-CM | POA: Diagnosis not present

## 2016-07-20 ENCOUNTER — Institutional Professional Consult (permissible substitution): Payer: Self-pay | Admitting: Internal Medicine

## 2016-07-21 ENCOUNTER — Telehealth: Payer: Self-pay

## 2016-07-21 NOTE — Telephone Encounter (Signed)
Pt recently had surgery that turned into a bigger surgery.She is still recovering and wants to r/s scans and  DR. Clark-Pearson's visit. Scans r/s to 09-22-16. Arrive at 915 to register in radiology. Npo 4 hrs prior to scan. Labs for Scans r/s to 09-08-16 at [facility].  The patient will p/u contrast at lab visit. Pt agreeable to this plan.

## 2016-07-22 ENCOUNTER — Other Ambulatory Visit: Payer: Self-pay

## 2016-07-27 ENCOUNTER — Ambulatory Visit (HOSPITAL_COMMUNITY): Payer: Medicare Other

## 2016-07-30 ENCOUNTER — Ambulatory Visit: Payer: Self-pay | Admitting: Gynecology

## 2016-08-03 ENCOUNTER — Other Ambulatory Visit (HOSPITAL_COMMUNITY): Payer: Self-pay | Admitting: Psychiatry

## 2016-08-05 ENCOUNTER — Telehealth: Payer: Self-pay

## 2016-08-05 NOTE — Telephone Encounter (Signed)
Medication's request from [facility]  Vit D2 cap 50000 unit: Last filled 10/28/2014 Buspirone 10 mg: Historical summary 06/30/15 Dicyclomine 10 mg: Last filled 03/17/2016/ Amy Easterwood, Pa-C Last OV 05/05/2016 Please advise

## 2016-08-05 NOTE — Telephone Encounter (Signed)
New Rx request for Ferrex 150 FORTE FROM [facility] LAST ov 05/05/2016 NO NEW OV PLEASE ADVISE

## 2016-08-05 NOTE — Telephone Encounter (Signed)
OK to fill all these prescription(s) with additional refills x2  Thank you!

## 2016-08-06 MED ORDER — VITAMIN D (ERGOCALCIFEROL) 1.25 MG (50000 UNIT) PO CAPS: 50000.0000 [IU] | ORAL_CAPSULE | ORAL | 2 refills | Status: DC

## 2016-08-06 MED ORDER — POLYSACCHAR IRON-FA-B12 150-1-25 MG-MG-MCG PO CAPS: 1.0000 | ORAL_CAPSULE | Freq: Every day | ORAL | 2 refills | Status: DC

## 2016-08-06 MED ORDER — DICYCLOMINE HCL 10 MG PO CAPS: ORAL_CAPSULE | ORAL | 2 refills | Status: DC

## 2016-08-06 MED ORDER — BUSPIRONE HCL 10 MG PO TABS: 10.0000 mg | ORAL_TABLET | Freq: Two times a day (BID) | ORAL | 2 refills | Status: DC

## 2016-08-06 NOTE — Telephone Encounter (Signed)
Medications sent to [facility] pharmacy

## 2016-08-11 ENCOUNTER — Encounter: Payer: Self-pay | Admitting: Internal Medicine

## 2016-08-11 ENCOUNTER — Other Ambulatory Visit: Payer: Self-pay | Admitting: Internal Medicine

## 2016-08-11 ENCOUNTER — Ambulatory Visit (INDEPENDENT_AMBULATORY_CARE_PROVIDER_SITE_OTHER): Payer: Medicare Other | Admitting: Internal Medicine

## 2016-08-11 VITALS — BP 122/72 | HR 71 | Ht 63.0 in | Wt 142.4 lb

## 2016-08-11 DIAGNOSIS — J45991 Cough variant asthma: Secondary | ICD-10-CM | POA: Diagnosis not present

## 2016-08-11 DIAGNOSIS — Z6825 Body mass index (BMI) 25.0-25.9, adult: Secondary | ICD-10-CM | POA: Diagnosis not present

## 2016-08-11 DIAGNOSIS — R030 Elevated blood-pressure reading, without diagnosis of hypertension: Secondary | ICD-10-CM | POA: Diagnosis not present

## 2016-08-11 DIAGNOSIS — M545 Low back pain: Secondary | ICD-10-CM | POA: Diagnosis not present

## 2016-08-11 LAB — NITRIC OXIDE: Nitric Oxide: 17

## 2016-08-11 MED ORDER — ALPRAZOLAM 0.5 MG PO TABS: 0.5000 mg | ORAL_TABLET | Freq: Two times a day (BID) | ORAL | 0 refills | Status: DC | PRN

## 2016-08-11 MED ORDER — VITAMIN D3 1.25 MG (50000 UT) PO CAPS: ORAL_CAPSULE | ORAL | 3 refills | Status: DC

## 2016-08-11 MED ORDER — GABAPENTIN 100 MG PO CAPS: 100.0000 mg | ORAL_CAPSULE | Freq: Three times a day (TID) | ORAL | 2 refills | Status: DC

## 2016-08-11 NOTE — Telephone Encounter (Signed)
Pt came by and said that the Vit d that was called in should be Vit D 3  Ins will not cover the one that was sent in.    Pt also said that she is on the xanex and norins was giving this to her but she needs to know if you can refill this?    0.5 mg 1 to 4 times daily (PRN)

## 2016-08-11 NOTE — Telephone Encounter (Signed)
OK  Xanax OK x0 ref Thx

## 2016-08-11 NOTE — Progress Notes (Signed)
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 Pulmonary Consult    referred by Dr. Velora Heckler, chronic cough for a year,, cough drops and water make it better, stuffy heated rooms and dust make it worse, SOB   eval by Orvil Feil for grass and tree allergy allergy rx with ICS powder and nasal spray no better  Cough is not present noct but present immediately when awake and immediate sense of need for water and using of throat lozenges Assoc with doe = MMRC1 = can walk nl pace, flat grade, can't hurry or go uphills or steps s sob   Worse cough with heat exp and perfumes  No obvious other  day to day or daytime variability or assoc excess/ purulent sputum or mucus plugs or hemoptysis or cp or chest tightness, subjective wheeze or overt sinus or hb symptoms. No unusual exp hx or h/o childhood pna/ asthma or knowledge of premature birth.  Sleeping ok without nocturnal exacerbation  of respiratory  c/o's or need for noct saba. Also denies any obvious fluctuation of symptoms with weather or environmental changes or other aggravating or alleviating factors except as outlined above   Current Medications, Allergies, Complete Past Medical History, Past Surgical History, Family History, and Social History were reviewed in [employer] record.       Review of Systems  Constitutional: Negative for fever and unexpected weight change.  HENT: Positive  for trouble swallowing. Negative for congestion, dental problem, ear pain, nosebleeds, postnasal drip, rhinorrhea, sinus pressure, sneezing and sore throat.   Eyes: Negative for redness and itching.  Respiratory: Positive for cough and shortness of breath. Negative for chest tightness and wheezing.   Cardiovascular: Negative for palpitations and leg swelling.  Gastrointestinal: Negative for nausea and vomiting.  Genitourinary: Negative for dysuria.  Musculoskeletal: Negative for joint swelling.  Skin: Negative for rash.  Neurological: Positive for headaches.  Hematological: Does not bruise/bleed easily.  Psychiatric/Behavioral: Negative for dysphoric Grant. The patient is not nervous/anxious.        Objective:   Physical Exam  Hoarse amb wf freq throat clearing   Wt Readings from Last 3 Encounters:  08/11/16 142 lb 6.4 oz (64.6 kg)  05/12/16 139 lb (63 kg)  05/05/16 139 lb (63 kg)    Vital signs reviewed  - Note on arrival 02 sats  99% on RA     HEENT: nl dentition, turbinates bilaterally, and oropharynx. Nl external ear canals without cough reflex   NECK :  without JVD/Nodes/TM/ nl carotid upstrokes bilaterally   LUNGS: no acc muscle use,  Nl contour chest which is clear to A and P bilaterally without cough on insp or exp maneuvers   CV:  RRR  no s3 or murmur or increase in P2, and no edema   ABD:  soft and nontender with nl inspiratory  excursion in the supine position. No bruits or organomegaly appreciated, bowel sounds nl  MS:  Nl gait/ ext warm without deformities, calf tenderness, cyanosis or clubbing No obvious joint restrictions   SKIN: warm and dry without lesions    NEURO:  alert, approp, nl sensorium with  no motor or cerebellar deficits apparent.      I personally reviewed images and agree with radiology impression as follows:  CXR:   01/26/16  Stable chest.  No active cardiopulmonary process.      Assessment & Plan:

## 2016-08-11 NOTE — Patient Instructions (Addendum)
Try off the prometrium for now and the fish oil and continue the nexium Take 30-60 min before first meal of the day   For drainage / throat tickle try take CHLORPHENIRAMINE  4 mg - take one every 4 hours as needed - available over the counter- may cause drowsiness so start with just a bedtime dose or two and see how you tolerate it before trying in daytime    GERD (REFLUX)  is an extremely common cause of respiratory symptoms just like yours , many times with no obvious heartburn at all.    It can be treated with medication, but also with lifestyle changes including elevation of the head of your bed (ideally with 6 inch  bed blocks),  Smoking cessation, avoidance of late meals, excessive alcohol, and avoid fatty foods, chocolate, peppermint, colas, red wine, and acidic juices such as orange juice.  NO MINT OR MENTHOL PRODUCTS SO NO COUGH DROPS   USE SUGARLESS CANDY INSTEAD (Jolley ranchers or Stover's or Life Savers) or even ice chips will also do - the key is to swallow to prevent all throat clearing. NO OIL BASED VITAMINS - use powdered substitutes.   If not improving after several weeks then rec start gabapentin 100 mg three times daily   Please schedule a follow up office visit in 6 weeks, call sooner if needed

## 2016-08-12 NOTE — Assessment & Plan Note (Signed)
FENO 08/11/2016  =   16 p no rx   The most common causes of chronic cough in immunocompetent adults include the following: upper airway cough syndrome (UACS), previously referred to as postnasal drip syndrome (PNDS), which is caused by variety of rhinosinus conditions; (2) asthma; (3) GERD; (4) chronic bronchitis from cigarette smoking or other inhaled environmental irritants; (5) nonasthmatic eosinophilic bronchitis; and (6) bronchiectasis.   These conditions, singly or in combination, have accounted for up to 94% of the causes of chronic cough in prospective studies.   Other conditions have constituted no >6% of the causes in prospective studies These have included bronchogenic carcinoma, chronic interstitial pneumonia, sarcoidosis, left ventricular failure, ACEI-induced cough, and aspiration from a condition associated with pharyngeal dysfunction.    Chronic cough is often simultaneously caused by more than one condition. A single cause has been found from 38 to 82% of the time, multiple causes from 18 to 62%. Multiply caused cough has been the result of three diseases up to 42% of the time.       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   These are the same pts (now being labeled as having "irritable larynx syndrome" by some cough centers) who not infrequently have a history of having failed to tolerate ace inhibitors,  dry powder inhalers(as is the case here) or biphosphonates or report having atypical/extraesophageal reflux  symptoms that don't respond to standard doses of PPI(as is the case here)   and are easily confused as having aecopd or asthma flares by even experienced allergists/ pulmonologists (myself included).    rec trial off fish oil/ prometrium and try 1st gen H1 per guidelines but if not better in a few weeks then next step is clearly a trial of low dose gabapentin then return here in a month to regroup for longterm control purposes  Total time devoted to counseling  > 50 % of initial 60 min office visit:  review case with pt/ discussion of options/alternatives/ personally creating written customized instructions  in presence of pt  then going over those specific  Instructions directly with the pt including how to use all of the meds but in particular covering each new medication in detail and the difference between the maintenance= "automatic" meds and the prns using an action plan format for the latter (If this problem/symptom => do that organization reading Left to right).  Please see AVS from this visit for a full list of these instructions which I personally wrote for this pt and  are unique to this visit.

## 2016-08-12 NOTE — Telephone Encounter (Signed)
Xanax faxed to [facility] pharmacy

## 2016-09-01 ENCOUNTER — Ambulatory Visit (INDEPENDENT_AMBULATORY_CARE_PROVIDER_SITE_OTHER): Payer: 59 | Admitting: Psychology

## 2016-09-01 DIAGNOSIS — F331 Major depressive disorder, recurrent, moderate: Secondary | ICD-10-CM | POA: Diagnosis not present

## 2016-09-08 ENCOUNTER — Other Ambulatory Visit: Payer: Self-pay

## 2016-09-22 ENCOUNTER — Ambulatory Visit: Payer: Self-pay | Admitting: Internal Medicine

## 2016-09-22 ENCOUNTER — Ambulatory Visit (HOSPITAL_COMMUNITY): Admission: RE | Admit: 2016-09-22 | Payer: Medicare Other | Source: Ambulatory Visit

## 2016-09-27 ENCOUNTER — Ambulatory Visit: Payer: 59 | Admitting: Psychology

## 2016-09-27 ENCOUNTER — Telehealth: Payer: Self-pay | Admitting: *Deleted

## 2016-09-27 NOTE — Telephone Encounter (Signed)
I have called the patient back and rescheduled her MD appt per her request. Patient aware of new date/time

## 2016-09-29 ENCOUNTER — Ambulatory Visit (HOSPITAL_COMMUNITY): Payer: Medicare Other

## 2016-09-30 ENCOUNTER — Other Ambulatory Visit: Payer: Self-pay | Admitting: *Deleted

## 2016-09-30 MED ORDER — VITAMIN D3 1.25 MG (50000 UT) PO CAPS: ORAL_CAPSULE | ORAL | 1 refills | Status: DC

## 2016-10-01 ENCOUNTER — Ambulatory Visit: Payer: Self-pay | Admitting: Gynecology

## 2016-10-06 ENCOUNTER — Telehealth: Payer: Self-pay | Admitting: *Deleted

## 2016-10-06 NOTE — Telephone Encounter (Signed)
Patient called and moved her appt from June 1st to June 15th. Patient is aware of the new date/time of June 15th at 8am; arrive at 7:45am.

## 2016-10-13 ENCOUNTER — Other Ambulatory Visit: Payer: Self-pay

## 2016-10-13 ENCOUNTER — Emergency Department (HOSPITAL_COMMUNITY): Payer: Medicare Other

## 2016-10-13 ENCOUNTER — Observation Stay (HOSPITAL_COMMUNITY): Payer: Medicare Other

## 2016-10-13 ENCOUNTER — Ambulatory Visit: Payer: 59 | Admitting: Psychology

## 2016-10-13 ENCOUNTER — Ambulatory Visit (HOSPITAL_COMMUNITY): Payer: Medicare Other

## 2016-10-13 ENCOUNTER — Inpatient Hospital Stay (HOSPITAL_COMMUNITY)
Admission: EM | Admit: 2016-10-13 | Discharge: 2016-10-16 | DRG: 690 | Disposition: A | Discharge status: Home / Self Care | Payer: Medicare Other | Attending: Internal Medicine | Admitting: Internal Medicine

## 2016-10-13 ENCOUNTER — Encounter (HOSPITAL_COMMUNITY): Payer: Self-pay | Admitting: *Deleted

## 2016-10-13 DIAGNOSIS — F419 Anxiety disorder, unspecified: Secondary | ICD-10-CM | POA: Diagnosis present

## 2016-10-13 DIAGNOSIS — K589 Irritable bowel syndrome without diarrhea: Secondary | ICD-10-CM | POA: Diagnosis present

## 2016-10-13 DIAGNOSIS — S0990XA Unspecified injury of head, initial encounter: Secondary | ICD-10-CM | POA: Diagnosis not present

## 2016-10-13 DIAGNOSIS — F319 Bipolar disorder, unspecified: Secondary | ICD-10-CM | POA: Diagnosis present

## 2016-10-13 DIAGNOSIS — G43019 Migraine without aura, intractable, without status migrainosus: Secondary | ICD-10-CM | POA: Diagnosis not present

## 2016-10-13 DIAGNOSIS — E785 Hyperlipidemia, unspecified: Secondary | ICD-10-CM | POA: Diagnosis not present

## 2016-10-13 DIAGNOSIS — N183 Chronic kidney disease, stage 3 unspecified: Secondary | ICD-10-CM

## 2016-10-13 DIAGNOSIS — D649 Anemia, unspecified: Secondary | ICD-10-CM | POA: Diagnosis present

## 2016-10-13 DIAGNOSIS — K219 Gastro-esophageal reflux disease without esophagitis: Secondary | ICD-10-CM | POA: Diagnosis not present

## 2016-10-13 DIAGNOSIS — J45909 Unspecified asthma, uncomplicated: Secondary | ICD-10-CM | POA: Diagnosis present

## 2016-10-13 DIAGNOSIS — N3000 Acute cystitis without hematuria: Secondary | ICD-10-CM | POA: Diagnosis not present

## 2016-10-13 DIAGNOSIS — E872 Acidosis: Secondary | ICD-10-CM | POA: Diagnosis not present

## 2016-10-13 DIAGNOSIS — G43909 Migraine, unspecified, not intractable, without status migrainosus: Secondary | ICD-10-CM | POA: Diagnosis not present

## 2016-10-13 DIAGNOSIS — E784 Other hyperlipidemia: Secondary | ICD-10-CM

## 2016-10-13 DIAGNOSIS — R32 Unspecified urinary incontinence: Secondary | ICD-10-CM | POA: Diagnosis present

## 2016-10-13 DIAGNOSIS — R55 Syncope and collapse: Secondary | ICD-10-CM | POA: Diagnosis present

## 2016-10-13 DIAGNOSIS — E039 Hypothyroidism, unspecified: Secondary | ICD-10-CM | POA: Diagnosis present

## 2016-10-13 DIAGNOSIS — R0600 Dyspnea, unspecified: Secondary | ICD-10-CM

## 2016-10-13 DIAGNOSIS — G4489 Other headache syndrome: Secondary | ICD-10-CM | POA: Diagnosis not present

## 2016-10-13 DIAGNOSIS — N390 Urinary tract infection, site not specified: Secondary | ICD-10-CM

## 2016-10-13 DIAGNOSIS — F329 Major depressive disorder, single episode, unspecified: Secondary | ICD-10-CM

## 2016-10-13 DIAGNOSIS — M5412 Radiculopathy, cervical region: Secondary | ICD-10-CM

## 2016-10-13 HISTORY — DX: Unspecified asthma, uncomplicated: J45.909

## 2016-10-13 HISTORY — DX: Anxiety disorder, unspecified: F41.9

## 2016-10-13 HISTORY — DX: Syncope and collapse: R55

## 2016-10-13 HISTORY — DX: Chronic kidney disease, stage 3 unspecified: N18.30

## 2016-10-13 HISTORY — DX: Urinary tract infection, site not specified: N39.0

## 2016-10-13 LAB — CBC WITH DIFFERENTIAL/PLATELET
Basophils Absolute: 0 10*3/uL (ref 0.0–0.1)
Basophils Relative: 0 %
Eosinophils Absolute: 0.1 10*3/uL (ref 0.0–0.7)
Eosinophils Relative: 0 %
HCT: 34.1 % — ABNORMAL LOW (ref 36.0–46.0)
Hemoglobin: 11 g/dL — ABNORMAL LOW (ref 12.0–15.0)
Lymphocytes Relative: 9 %
Lymphs Abs: 1.3 10*3/uL (ref 0.7–4.0)
MCH: 29.1 pg (ref 26.0–34.0)
MCHC: 32.3 g/dL (ref 30.0–36.0)
MCV: 90.2 fL (ref 78.0–100.0)
Monocytes Absolute: 0.7 10*3/uL (ref 0.1–1.0)
Monocytes Relative: 5 %
Neutro Abs: 12 10*3/uL — ABNORMAL HIGH (ref 1.7–7.7)
Neutrophils Relative %: 86 %
Platelets: 253 10*3/uL (ref 150–400)
RBC: 3.78 MIL/uL — ABNORMAL LOW (ref 3.87–5.11)
RDW: 15 % (ref 11.5–15.5)
WBC: 14 10*3/uL — ABNORMAL HIGH (ref 4.0–10.5)

## 2016-10-13 LAB — URINALYSIS, ROUTINE W REFLEX MICROSCOPIC
Bilirubin Urine: NEGATIVE
Glucose, UA: NEGATIVE mg/dL
Ketones, ur: NEGATIVE mg/dL
Nitrite: POSITIVE — AB
Protein, ur: NEGATIVE mg/dL
Specific Gravity, Urine: 1.009 (ref 1.005–1.030)
pH: 7 (ref 5.0–8.0)

## 2016-10-13 LAB — I-STAT CHEM 8, ED
BUN: 17 mg/dL (ref 6–20)
Calcium, Ion: 1.47 mmol/L — ABNORMAL HIGH (ref 1.15–1.40)
Chloride: 109 mmol/L (ref 101–111)
Creatinine, Ser: 1.4 mg/dL — ABNORMAL HIGH (ref 0.44–1.00)
Glucose, Bld: 102 mg/dL — ABNORMAL HIGH (ref 65–99)
HCT: 33 % — ABNORMAL LOW (ref 36.0–46.0)
Hemoglobin: 11.2 g/dL — ABNORMAL LOW (ref 12.0–15.0)
Potassium: 3.9 mmol/L (ref 3.5–5.1)
Sodium: 140 mmol/L (ref 135–145)
TCO2: 21 mmol/L (ref 0–100)

## 2016-10-13 LAB — COMPREHENSIVE METABOLIC PANEL
ALT: 16 U/L (ref 14–54)
AST: 24 U/L (ref 15–41)
Albumin: 3 g/dL — ABNORMAL LOW (ref 3.5–5.0)
Alkaline Phosphatase: 116 U/L (ref 38–126)
Anion gap: 7 (ref 5–15)
BUN: 14 mg/dL (ref 6–20)
CO2: 21 mmol/L — ABNORMAL LOW (ref 22–32)
Calcium: 10.3 mg/dL (ref 8.9–10.3)
Chloride: 110 mmol/L (ref 101–111)
Creatinine, Ser: 1.43 mg/dL — ABNORMAL HIGH (ref 0.44–1.00)
GFR calc Af Amer: 43 mL/min — ABNORMAL LOW (ref >60)
GFR calc non Af Amer: 37 mL/min — ABNORMAL LOW (ref >60)
Glucose, Bld: 103 mg/dL — ABNORMAL HIGH (ref 65–99)
Potassium: 3.9 mmol/L (ref 3.5–5.1)
Sodium: 138 mmol/L (ref 135–145)
Total Bilirubin: 0.5 mg/dL (ref 0.3–1.2)
Total Protein: 6.6 g/dL (ref 6.5–8.1)

## 2016-10-13 LAB — PHOSPHORUS: Phosphorus: 1.7 mg/dL — ABNORMAL LOW (ref 2.5–4.6)

## 2016-10-13 LAB — MAGNESIUM: Magnesium: 1.7 mg/dL (ref 1.7–2.4)

## 2016-10-13 LAB — CK: Total CK: 33 U/L — ABNORMAL LOW (ref 38–234)

## 2016-10-13 LAB — TROPONIN I: Troponin I: <0.03 ng/mL (ref <0.03)

## 2016-10-13 MED ORDER — FLUOXETINE HCL 40 MG PO CAPS: 40.0000 mg | ORAL_CAPSULE | Freq: Every morning | ORAL | Status: DC

## 2016-10-13 MED ORDER — IOPAMIDOL (ISOVUE-370) INJECTION 76%
INTRAVENOUS | Status: AC
  Administered 2016-10-13: 50 mL
  Filled 2016-10-13: qty 50

## 2016-10-13 MED ORDER — KETOROLAC TROMETHAMINE 15 MG/ML IJ SOLN
15.0000 mg | Freq: Three times a day (TID) | INTRAMUSCULAR | Status: AC
  Administered 2016-10-13 – 2016-10-14 (×4): 15 mg via INTRAVENOUS
  Filled 2016-10-13 (×4): qty 1

## 2016-10-13 MED ORDER — DEXAMETHASONE SODIUM PHOSPHATE 10 MG/ML IJ SOLN
10.0000 mg | Freq: Once | INTRAMUSCULAR | Status: AC
  Administered 2016-10-13: 10 mg via INTRAVENOUS
  Filled 2016-10-13: qty 1

## 2016-10-13 MED ORDER — MIRTAZAPINE 15 MG PO TABS
30.0000 mg | ORAL_TABLET | Freq: Every day | ORAL | Status: DC
  Administered 2016-10-13 – 2016-10-15 (×3): 30 mg via ORAL
  Filled 2016-10-13 (×3): qty 2

## 2016-10-13 MED ORDER — ONDANSETRON HCL 4 MG/2ML IJ SOLN: 4.0000 mg | Freq: Four times a day (QID) | INTRAMUSCULAR | Status: DC | PRN

## 2016-10-13 MED ORDER — TOPIRAMATE 25 MG PO TABS
50.0000 mg | ORAL_TABLET | Freq: Every day | ORAL | Status: DC
  Administered 2016-10-13 – 2016-10-15 (×3): 50 mg via ORAL
  Filled 2016-10-13 (×3): qty 2

## 2016-10-13 MED ORDER — KETOROLAC TROMETHAMINE 15 MG/ML IJ SOLN: 15.0000 mg | Freq: Four times a day (QID) | INTRAMUSCULAR | Status: DC

## 2016-10-13 MED ORDER — ONDANSETRON HCL 4 MG PO TABS
4.0000 mg | ORAL_TABLET | Freq: Four times a day (QID) | ORAL | Status: DC | PRN
  Administered 2016-10-15: 4 mg via ORAL
  Filled 2016-10-13: qty 1

## 2016-10-13 MED ORDER — ACETAMINOPHEN 650 MG RE SUPP: 650.0000 mg | Freq: Four times a day (QID) | RECTAL | Status: DC | PRN

## 2016-10-13 MED ORDER — ESTRADIOL 1 MG PO TABS
1.0000 mg | ORAL_TABLET | Freq: Every day | ORAL | Status: DC
  Administered 2016-10-14 – 2016-10-16 (×3): 1 mg via ORAL
  Filled 2016-10-13 (×3): qty 1

## 2016-10-13 MED ORDER — SODIUM CHLORIDE 0.9% FLUSH
3.0000 mL | Freq: Two times a day (BID) | INTRAVENOUS | Status: DC
  Administered 2016-10-14 – 2016-10-15 (×4): 3 mL via INTRAVENOUS

## 2016-10-13 MED ORDER — METOCLOPRAMIDE HCL 5 MG/ML IJ SOLN
10.0000 mg | Freq: Once | INTRAMUSCULAR | Status: AC
  Administered 2016-10-13: 10 mg via INTRAVENOUS
  Filled 2016-10-13: qty 2

## 2016-10-13 MED ORDER — LEVOTHYROXINE SODIUM 100 MCG PO TABS
100.0000 ug | ORAL_TABLET | Freq: Every day | ORAL | Status: DC
  Administered 2016-10-14 – 2016-10-16 (×3): 100 ug via ORAL
  Filled 2016-10-13 (×3): qty 1

## 2016-10-13 MED ORDER — BACLOFEN 10 MG PO TABS
10.0000 mg | ORAL_TABLET | Freq: Every day | ORAL | Status: DC | PRN
Start: 2016-10-13 — End: 2016-10-16
  Administered 2016-10-14 – 2016-10-15 (×2): 10 mg via ORAL
  Filled 2016-10-13 (×4): qty 1

## 2016-10-13 MED ORDER — ACETAMINOPHEN 325 MG PO TABS
650.0000 mg | ORAL_TABLET | Freq: Four times a day (QID) | ORAL | Status: DC | PRN
  Administered 2016-10-15 (×2): 650 mg via ORAL
  Filled 2016-10-13 (×2): qty 2

## 2016-10-13 MED ORDER — DEXTROSE 5 % IV SOLN
1.0000 g | INTRAVENOUS | Status: DC
  Administered 2016-10-13 – 2016-10-14 (×2): 1 g via INTRAVENOUS
  Filled 2016-10-13 (×2): qty 10

## 2016-10-13 MED ORDER — SODIUM CHLORIDE 0.9 % IV SOLN
INTRAVENOUS | Status: DC
  Administered 2016-10-13 – 2016-10-14 (×2): via INTRAVENOUS

## 2016-10-13 MED ORDER — SODIUM CHLORIDE 0.9 % IV BOLUS (SEPSIS)
1000.0000 mL | Freq: Once | INTRAVENOUS | Status: AC
  Administered 2016-10-13: 1000 mL via INTRAVENOUS

## 2016-10-13 MED ORDER — FLUTICASONE PROPIONATE 50 MCG/ACT NA SUSP
1.0000 | Freq: Two times a day (BID) | NASAL | Status: DC
  Administered 2016-10-13 – 2016-10-14 (×2): 1 via NASAL
  Filled 2016-10-13 (×2): qty 16

## 2016-10-13 MED ORDER — TRAMADOL HCL 50 MG PO TABS
50.0000 mg | ORAL_TABLET | Freq: Four times a day (QID) | ORAL | Status: DC | PRN
  Administered 2016-10-14: 50 mg via ORAL
  Filled 2016-10-13: qty 1

## 2016-10-13 MED ORDER — TOPIRAMATE 25 MG PO TABS: 50.0000 mg | ORAL_TABLET | Freq: Every day | ORAL | Status: DC

## 2016-10-13 MED ORDER — GABAPENTIN 100 MG PO CAPS
100.0000 mg | ORAL_CAPSULE | Freq: Three times a day (TID) | ORAL | Status: DC
  Administered 2016-10-13 – 2016-10-16 (×8): 100 mg via ORAL
  Filled 2016-10-13 (×9): qty 1

## 2016-10-13 MED ORDER — FLUOXETINE HCL 20 MG PO CAPS
40.0000 mg | ORAL_CAPSULE | Freq: Every day | ORAL | Status: DC
  Administered 2016-10-14 – 2016-10-16 (×3): 40 mg via ORAL
  Filled 2016-10-13 (×3): qty 2

## 2016-10-13 MED ORDER — ENOXAPARIN SODIUM 40 MG/0.4ML ~~LOC~~ SOLN
40.0000 mg | SUBCUTANEOUS | Status: DC
  Administered 2016-10-13 – 2016-10-15 (×3): 40 mg via SUBCUTANEOUS
  Filled 2016-10-13 (×3): qty 0.4

## 2016-10-13 MED ORDER — BUSPIRONE HCL 10 MG PO TABS
10.0000 mg | ORAL_TABLET | Freq: Two times a day (BID) | ORAL | Status: DC
  Administered 2016-10-13 – 2016-10-16 (×6): 10 mg via ORAL
  Filled 2016-10-13 (×6): qty 1

## 2016-10-13 MED ORDER — DIPHENHYDRAMINE HCL 50 MG/ML IJ SOLN
25.0000 mg | Freq: Once | INTRAMUSCULAR | Status: AC
  Administered 2016-10-13: 25 mg via INTRAVENOUS
  Filled 2016-10-13: qty 1

## 2016-10-13 MED ORDER — PANTOPRAZOLE SODIUM 40 MG IV SOLR
40.0000 mg | Freq: Two times a day (BID) | INTRAVENOUS | Status: DC
  Administered 2016-10-13 – 2016-10-14 (×2): 40 mg via INTRAVENOUS
  Filled 2016-10-13 (×2): qty 40

## 2016-10-13 MED ORDER — ALPRAZOLAM 0.5 MG PO TABS
0.5000 mg | ORAL_TABLET | Freq: Two times a day (BID) | ORAL | Status: DC | PRN
  Administered 2016-10-15 (×2): 0.5 mg via ORAL
  Filled 2016-10-13 (×2): qty 1

## 2016-10-13 NOTE — ED Triage Notes (Signed)
Pt arrives via [facility]. Pt states she has had a syncopal episode each night for the past two nights. Pt also endorses generalized body aches and a migraine x 5 days. Pt states she hit her head during on e of the syncopal episodes.

## 2016-10-13 NOTE — Progress Notes (Signed)
EEG completed, results pending. 

## 2016-10-13 NOTE — ED Provider Notes (Signed)
[facility] DEPT Provider Note   CSN: [id number] Arrival date & time: 10/13/16  1205     History   Chief Complaint Chief Complaint  Patient presents with  . Loss of Consciousness  . Generalized Body Aches    HPI [name] is a 66 y.o. female.  HPI  66 year old female presents with headache, dizziness, and syncope. She states that 5 days ago she developed a headache and posterior neck pain which is typical of her migraines. She took tramadol and Xanax like normal. Been taking extra of these at home due to the pain. However the headaches seem to be improving but then 2 nights ago when getting up off of the bed she collapsed to the ground and was unable to get up. She estimates she was unconscious for 10 minutes. She never felt any preceding dizziness or palpitations. She had to crawl to the dresser to change because she urinated herself. She denied a crawl back to get back in bed. Next day (yesterday) she was dizzy all day, especially with movement or standing. Feels off balance. Again passed out last night when she was going to the door to walk her dog. She thinks she was unconscious for 20 or 30 minutes. Again had a lot of trouble getting up. This morning her headache and neck pain or low but worse. She then she hit the back of her head as well as a mild contusion to her left forearm. She did not take any Xanax or tramadol last night because she was worried that it was a medication affect the first time. She urinated on her self the second time 2. No tongue biting or pain. No history of seizures.  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 She does not remember injuring herself at all but over the last 2 days, starting before the first passing out episode, she has been having generalized aches, most prominent in her proximal arms, proximal thighs, and abdomen. No chest pain.   Past Medical History:  Diagnosis Date    . Allergic rhinitis, cause unspecified   . Anxiety   . Asthma    chronic cough  . Bipolar I disorder, most recent episode (or current) depressed, severe, without mention of psychotic behavior   . Blood transfusion without reported diagnosis   . Deep phlebothrombosis, postpartum, unspecified as to episode of care(671.40)   . Depression   . Fibromyalgia   . Gastritis   . GERD (gastroesophageal reflux disease)   . Hyperlipidemia   . IBS (irritable bowel syndrome)   . 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 Migraine headache   . Other and unspecified hyperlipidemia   . Parathyroid adenoma 2014  . Status post chemotherapy 2014    Patient Active Problem List   Diagnosis Date Noted  . Syncope 10/13/2016  . Migraine headache 10/13/2016  . Asthma 10/13/2016  . Anxiety 10/13/2016  . Hyperlipidemia 10/13/2016  . UTI (urinary tract infection) 10/13/2016  . CKD (chronic kidney disease) stage 3, GFR 30-59 ml/min 10/13/2016  . Chronic depression 10/13/2016  . Hypothyroidism 10/13/2016  . Acute upper respiratory infection 05/05/2016  . Chronic urinary tract infection 07/22/2015  . Malaise and fatigue 04/16/2015  . Severe episode of recurrent major depressive disorder, without psychotic features ([place]) 12/18/2014  . Post concussion syndrome 10/28/2014  . 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 Depression 10/25/2014  . Major depressive disorder, recurrent,  severe without psychotic features ([place])   . 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 Concussion w/o coma 09/18/2014  . Irritable bowel syndrome with diarrhea 09/12/2014  . 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 Cold intolerance 03/26/2014  . Cough variant asthma  vs UACS/ irritable larynx  03/26/2014  . Abdominal pain, generalized 03/26/2014  . Positive skin test 03/26/2014  . Adjustment disorder with anxiety 02/07/2014  . Cervical pain 12/05/2013  .  Fibromyalgia 12/05/2013  . Cephalalgia 12/05/2013  . Adult hypothyroidism 12/05/2013  . Adaptive colitis 12/05/2013  . Menopause 12/05/2013  . Mild cognitive disorder 12/05/2013  . Feeling bilious 12/05/2013  . Leiomyosarcoma ([place]) 09/10/2013  . Lung mass 09/10/2013  . Lung nodule, solitary 09/10/2013  . Abnormal chest x-ray   . Unspecified hypothyroidism 04/25/2013  . Vitamin D deficiency 04/25/2013  . 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 Female genuine stress incontinence 04/12/2012  . Elevated blood pressure reading without diagnosis of hypertension 10/30/2010  . Healthcare maintenance 10/30/2010  . MUSCLE PAIN 07/03/2008  . BIPLR I MOST RECENT EPIS DPRS SEV NO PSYCHOT [facility] 02/14/2008  . DEEP PHLEBOTHROMBOSIS PP UNSPEC AS EPIS CARE 02/14/2008  . Bipolar affective disorder, current episode depressed ([place]) 02/14/2008  . Deep phlebothrombosis, postpartum 02/14/2008  . HYPERLIPIDEMIA 03/04/2007  . ALLERGIC RHINITIS 03/04/2007  . GERD 03/04/2007  . IRRITABLE BOWEL SYNDROME, HX OF 03/04/2007    Past Surgical History:  Procedure Laterality Date  . CESAREAN SECTION  1986  . CHOLECYSTECTOMY N/A 05/03/2013   Procedure: LAPAROSCOPIC CHOLECYSTECTOMY WITH INTRAOPERATIVE CHOLANGIOGRAM;  Surgeon: Odis Hollingshead, MD;  Location: [facility];  Service: General;  Laterality: N/A;  . COLONOSCOPY    . laprotomy  04/2012   for cancer  . NECK SURGERY  2006   C spine fusion  . PUBOVAGINAL SLING    . VAGINAL HYSTERECTOMY      OB History    No data available       Home Medications    Prior to Admission medications   Medication Sig Start Date End Date Taking? Authorizing Provider  ALPRAZolam Duanne Moron) 0.5 MG tablet Take 1 tablet (0.5 mg total) by mouth 2 (two) times daily as needed for anxiety. 08/11/16   Plotnikov, [name], MD  baclofen (LIORESAL) 10 MG tablet Take 10 mg by mouth as needed for muscle spasms.    [provider]  busPIRone (BUSPAR) 10 MG tablet Take 1 tablet  (10 mg total) by mouth 2 (two) times daily. 08/06/16   Plotnikov, [name], MD  Cholecalciferol (VITAMIN D3) 50000 units CAPS 2 caps per week 09/30/16   Plotnikov, [name], MD  Cyanocobalamin (VITAMIN B-12) 1000 MCG SUBL Place 1 tablet (1,000 mcg total) under the tongue daily. 05/05/16   Plotnikov, [name], MD  dicyclomine (BENTYL) 10 MG capsule Take 1 tab twice daily. 08/06/16   Plotnikov, [name], MD  esomeprazole (NEXIUM) 40 MG capsule Take 1 capsule (40 mg total) by mouth 2 (two) times daily before a meal. 10/28/14   Niel Hummer, NP  estradiol (ESTRACE) 1 MG tablet Take 1 tablet (1 mg total) by mouth daily. 10/28/14   Niel Hummer, NP  FLUoxetine (PROZAC) 40 MG capsule Take 1 capsule (40 mg total) by mouth every morning. 07/01/16   Plotnikov, [name], MD  fluticasone (FLONASE) 50 MCG/ACT nasal spray Place 1 spray into both nostrils 2 (two) times daily.    [provider]  gabapentin (NEURONTIN)  100 MG capsule Take 1 capsule (100 mg total) by mouth 3 (three) times daily. One three times daily 08/11/16   Tanda Rockers, MD  iron polysaccharides (NU-IRON) 150 MG capsule Take 1 capsule (150 mg total) by mouth 2 (two) times daily. 04/14/16   Esterwood, Amy S, PA-C  levothyroxine (SYNTHROID, LEVOTHROID) 100 MCG tablet Take 1 tablet (100 mcg total) by mouth daily before breakfast. 07/01/16   Plotnikov, [name], MD  mirtazapine (REMERON) 30 MG tablet Take 1 tablet (30 mg total) by mouth at bedtime. 07/01/16   Plotnikov, [name], MD  Multiple Vitamins-Minerals (MULTIVITAMIN WITH MINERALS) tablet Take 1 tablet by mouth daily. 10/28/14   Niel Hummer, NP  ondansetron (ZOFRAN) 4 MG tablet Take 4 mg by mouth as needed.  12/27/14   [provider]  Polysacchar Iron-FA-B12 (FERREX 150 FORTE) 150-1-25 MG-MG-MCG CAPS Take 1 capsule by mouth daily. 08/06/16   Plotnikov, [name], MD  SUMAtriptan (IMITREX) 6 MG/0.5ML SOLN injection Inject 6 mg into the skin every 2 (two) hours as needed  for migraine or headache. Reported on 08/11/2015    [provider]  topiramate (TOPAMAX) 50 MG tablet Take 1 tablet (50 mg total) by mouth at bedtime. 07/01/16   Plotnikov, [name], MD  traMADol (ULTRAM) 50 MG tablet Take by mouth every 6 (six) hours as needed.    [provider]  trimethoprim (TRIMPEX) 100 MG tablet Take 50 mg by mouth daily.  07/22/15   [provider]    Family History Family History  Problem Relation Age of Onset  . Coronary artery disease Father   . Heart disease Father   . Hypertension Father   . Irritable bowel syndrome Father   . Arthritis Mother   . Hypertension Mother   . Migraines Mother   . Heart disease Mother   . Diabetes Maternal Grandmother   . Irritable bowel syndrome Sister   . Irritable bowel syndrome Brother   . Colon cancer Neg Hx   . Esophageal cancer Neg Hx   . Rectal cancer Neg Hx   . Stomach cancer Neg Hx     Social History Social History  Substance Use Topics  . Smoking status: Never Smoker  . Smokeless tobacco: Never Used  . Alcohol use 1.2 - 1.8 oz/week    2 - 3 Standard drinks or equivalent per week     Comment: rarely     Allergies   Buprenorphine hcl; Morphine and related; Codeine; Dust mite extract; and Molds & smuts   Review of Systems Review of Systems  Constitutional: Negative for fever.  HENT: Positive for hearing loss and tinnitus (bilateral).   Respiratory: Negative for shortness of breath.   Cardiovascular: Negative for chest pain and palpitations.  Gastrointestinal: Positive for nausea. Negative for vomiting.  Genitourinary: Negative for dysuria.  Musculoskeletal: Positive for neck pain.  Neurological: Positive for dizziness, syncope, weakness and headaches.  All other systems reviewed and are negative.    Physical Exam Updated Vital Signs BP 128/79   Pulse 69   Temp 98.7 F (37.1 C) (Oral)   Resp 14   Ht 5\' 3"  (1.6 m)   Wt 60.8 kg (134 lb)   SpO2 100%   BMI 23.74 kg/m    Physical Exam  Constitutional: She is oriented to person, place, and time. She appears well-developed and well-nourished.  HENT:  Head: Normocephalic.  Right Ear: Tympanic membrane, external ear and ear canal normal.  Left Ear: Tympanic membrane, external  ear and ear canal normal.  Nose: Nose normal.  Mild occipital scalp tenderness, no laceration or significant swelling  Eyes: EOM are normal. Pupils are equal, round, and reactive to light. Right eye exhibits no discharge. Left eye exhibits no discharge.  Neck: Neck supple.  Well healed midline neck scar. Mild surrounding tenderness, no swelling or redness  Cardiovascular: Normal rate, regular rhythm and normal heart sounds.   No murmur heard. Pulmonary/Chest: Effort normal and breath sounds normal.  Abdominal: Soft. There is no tenderness.  Musculoskeletal:  Mild diffuse tenderness to upper arms, thighs, forearms bilaterally Small ecchymosis to proximal left forearm without tenderness or swelling  Neurological: She is alert and oriented to person, place, and time.  CN 3-12 grossly intact. 5/5 strength in all 4 extremities. Grossly normal sensation. Normal finger to nose.   Skin: Skin is warm and dry.  Nursing note and vitals reviewed.    ED Treatments / Results  Labs (all labs ordered are listed, but only abnormal results are displayed) Labs Reviewed  COMPREHENSIVE METABOLIC PANEL - Abnormal; Notable for the following:       Result Value   CO2 21 (*)    Glucose, Bld 103 (*)    Creatinine, Ser 1.43 (*)    Albumin 3.0 (*)    GFR calc non Af Amer 37 (*)    GFR calc Af Amer 43 (*)    All other components within normal limits  CBC WITH DIFFERENTIAL/PLATELET - Abnormal; Notable for the following:    WBC 14.0 (*)    RBC 3.78 (*)    Hemoglobin 11.0 (*)    HCT 34.1 (*)    Neutro Abs 12.0 (*)    All other components within normal limits  URINALYSIS, ROUTINE W REFLEX MICROSCOPIC - Abnormal; Notable for the following:     APPearance HAZY (*)    Hgb urine dipstick SMALL (*)    Nitrite POSITIVE (*)    Leukocytes, UA MODERATE (*)    Bacteria, UA MANY (*)    Squamous Epithelial / LPF 0-5 (*)    All other components within normal limits  CK - Abnormal; Notable for the following:    Total CK 33 (*)    All other components within normal limits  I-STAT CHEM 8, ED - Abnormal; Notable for the following:    Creatinine, Ser 1.40 (*)    Glucose, Bld 102 (*)    Calcium, Ion 1.47 (*)    Hemoglobin 11.2 (*)    HCT 33.0 (*)    All other components within normal limits  URINE CULTURE  TROPONIN I    EKG  EKG Interpretation  Date/Time:  Wednesday Oct 13 2016 12:03:38 EDT Ventricular Rate:  81 PR Interval:    QRS Duration: 91 QT Interval:  354 QTC Calculation: 411 R Axis:   75 Text Interpretation:  Age not entered, assumed to be  66 years old for purpose of ECG interpretation Sinus rhythm no significant change since 2009 Confirmed by Sherwood Gambler [phone]) on 10/13/2016 1:39:13 PM       Radiology Ct Angio Head W Or Wo Contrast  Result Date: 10/13/2016 CLINICAL DATA:  67 year old female with dizziness, posterior head and neck pain for 4 days. Syncopal episode last night. Diffuse weakness. EXAM: CT ANGIOGRAPHY HEAD AND NECK TECHNIQUE: Multidetector CT imaging of the head and neck was performed using the standard protocol during bolus administration of intravenous contrast. Multiplanar CT image reconstructions and MIPs were obtained to evaluate the vascular anatomy. Carotid stenosis  measurements (when applicable) are obtained utilizing NASCET criteria, using the distal internal carotid diameter as the denominator. CONTRAST:  50 mL Isovue 370 COMPARISON:  CT cervical myelogram 08/18/2015. Outside brain MRI 04/29/2015. CT head and cervical spine 09/12/2014. Chest CT 09/04/2015. FINDINGS: CT HEAD Brain: Cerebral volume is stable since 2016 and remains normal for age. No midline shift, ventriculomegaly, mass effect,  evidence of mass lesion, intracranial hemorrhage or evidence of cortically based acute infarction. Gray-white matter differentiation is within normal limits throughout the brain. Calvarium and skull base: Negative. Paranasal sinuses: Clear. Orbits: Visualized orbits and scalp soft tissues are within normal limits. CTA NECK Skeleton: Extensive prior cervical spine surgery with addition of C1-C3 level posterior fusion hardware since the 2017 CT myelogram. No acute osseous abnormality identified. Upper chest: Negative visualized superior mediastinum. Stable small calcified right upper lobe granuloma. Minor dependent atelectasis. Other neck: Negative.  No cervical lymphadenopathy. Aortic arch: 3 vessel arch configuration.  No arch atherosclerosis. Right carotid system: Mildly tortuous brachiocephalic artery with a mildly kinked appearance at the right thoracic inlet. Normal right CCA origin. Normal right CCA and right carotid bifurcation. Normal cervical right ICA aside from mild tortuosity. Left carotid system: Normal left CCA origin. Normal left CCA. Normal left carotid bifurcation. Normal cervical left ICA aside from mild tortuosity. Vertebral arteries: No proximal right subclavian atherosclerosis or stenosis. Normal right vertebral artery origin. Mildly dolichoectatic but otherwise normal cervical right vertebral artery. No proximal left subclavian atherosclerosis or stenosis. Normal left vertebral artery origin. Mildly tortuous but otherwise normal left vertebral artery to the skullbase. CTA HEAD Posterior circulation: Normal distal vertebral arteries. Normal PICA origins and vertebrobasilar junction. Basilar artery, AICA origins, SCA origins and PCA origins are patent without stenosis. Posterior communicating arteries are diminutive or absent. Bilateral PCA branches are normal. Anterior circulation: Both ICA siphons are patent with minimal calcified plaque on the left. No siphon stenosis. Normal carotid termini.  Normal MCA and ACA origins. Mildly ectatic anterior communicating artery. Bilateral ACA branches are within normal limits. Left MCA M1 segment, trifurcation, and left MCA branches are within normal limits. Right MCA M1 segment, trifurcation, and right MCA branches are within normal limits. Venous sinuses: Patent. Anatomic variants: None. Delayed phase: No abnormal enhancement identified. Review of the MIP images confirms the above findings IMPRESSION: 1. Normal arterial findings on CTA head and neck except for tortuosity of the cervical carotid and vertebral arteries. 2.  Normal for age CT appearance of the brain. 3. Extensive prior cervical spine surgery. No acute osseous abnormality identified. Electronically Signed   By: Genevie Ann M.D.   On: 10/13/2016 15:32   Ct Angio Neck W And/or Wo Contrast  Result Date: 10/13/2016 CLINICAL DATA:  66 year old female with dizziness, posterior head and neck pain for 4 days. Syncopal episode last night. Diffuse weakness. EXAM: CT ANGIOGRAPHY HEAD AND NECK TECHNIQUE: Multidetector CT imaging of the head and neck was performed using the standard protocol during bolus administration of intravenous contrast. Multiplanar CT image reconstructions and MIPs were obtained to evaluate the vascular anatomy. Carotid stenosis measurements (when applicable) are obtained utilizing NASCET criteria, using the distal internal carotid diameter as the denominator. CONTRAST:  50 mL Isovue 370 COMPARISON:  CT cervical myelogram 08/18/2015. Outside brain MRI 04/29/2015. CT head and cervical spine 09/12/2014. Chest CT 09/04/2015. FINDINGS: CT HEAD Brain: Cerebral volume is stable since 2016 and remains normal for age. No midline shift, ventriculomegaly, mass effect, evidence of mass lesion, intracranial hemorrhage or evidence of cortically based acute infarction.  Gray-white matter differentiation is within normal limits throughout the brain. Calvarium and skull base: Negative. Paranasal sinuses:  Clear. Orbits: Visualized orbits and scalp soft tissues are within normal limits. CTA NECK Skeleton: Extensive prior cervical spine surgery with addition of C1-C3 level posterior fusion hardware since the 2017 CT myelogram. No acute osseous abnormality identified. Upper chest: Negative visualized superior mediastinum. Stable small calcified right upper lobe granuloma. Minor dependent atelectasis. Other neck: Negative.  No cervical lymphadenopathy. Aortic arch: 3 vessel arch configuration.  No arch atherosclerosis. Right carotid system: Mildly tortuous brachiocephalic artery with a mildly kinked appearance at the right thoracic inlet. Normal right CCA origin. Normal right CCA and right carotid bifurcation. Normal cervical right ICA aside from mild tortuosity. Left carotid system: Normal left CCA origin. Normal left CCA. Normal left carotid bifurcation. Normal cervical left ICA aside from mild tortuosity. Vertebral arteries: No proximal right subclavian atherosclerosis or stenosis. Normal right vertebral artery origin. Mildly dolichoectatic but otherwise normal cervical right vertebral artery. No proximal left subclavian atherosclerosis or stenosis. Normal left vertebral artery origin. Mildly tortuous but otherwise normal left vertebral artery to the skullbase. CTA HEAD Posterior circulation: Normal distal vertebral arteries. Normal PICA origins and vertebrobasilar junction. Basilar artery, AICA origins, SCA origins and PCA origins are patent without stenosis. Posterior communicating arteries are diminutive or absent. Bilateral PCA branches are normal. Anterior circulation: Both ICA siphons are patent with minimal calcified plaque on the left. No siphon stenosis. Normal carotid termini. Normal MCA and ACA origins. Mildly ectatic anterior communicating artery. Bilateral ACA branches are within normal limits. Left MCA M1 segment, trifurcation, and left MCA branches are within normal limits. Right MCA M1 segment,  trifurcation, and right MCA branches are within normal limits. Venous sinuses: Patent. Anatomic variants: None. Delayed phase: No abnormal enhancement identified. Review of the MIP images confirms the above findings IMPRESSION: 1. Normal arterial findings on CTA head and neck except for tortuosity of the cervical carotid and vertebral arteries. 2.  Normal for age CT appearance of the brain. 3. Extensive prior cervical spine surgery. No acute osseous abnormality identified. Electronically Signed   By: Genevie Ann M.D.   On: 10/13/2016 15:32    Procedures Procedures (including critical care time)  Medications Ordered in ED Medications  pantoprazole (PROTONIX) injection 40 mg (not administered)  dexamethasone (DECADRON) injection 10 mg (not administered)  0.9 %  sodium chloride infusion (not administered)  ketorolac (TORADOL) 15 MG/ML injection 15 mg (not administered)  cefTRIAXone (ROCEPHIN) 1 g in dextrose 5 % 50 mL IVPB (not administered)  sodium chloride 0.9 % bolus 1,000 mL (1,000 mLs Intravenous New Bag/Given 10/13/16 1326)  iopamidol (ISOVUE-370) 76 % injection (50 mLs  Contrast Given 10/13/16 1446)  metoCLOPramide (REGLAN) injection 10 mg (10 mg Intravenous Given 10/13/16 1538)  diphenhydrAMINE (BENADRYL) injection 25 mg (25 mg Intravenous Given 10/13/16 1540)     Initial Impression / Assessment and Plan / ED Course  I have reviewed the triage vital signs and the nursing notes.  Pertinent labs & imaging results that were available during my care of the patient were reviewed by me and considered in my medical decision making (see chart for details).     No clear cause of her syncope. Patient feels like she was out for almost 10 min at a time or more, though no clock seen to verify and no witness. Consider syncope vs seizure. No rhabdo on workup. No obvious abnormalities on CTA given headache and dizziness. Will treat headache, fluids, and admit  for syncope workup.  Final Clinical  Impressions(s) / ED Diagnoses   Final diagnoses:  Syncope, unspecified syncope type    New Prescriptions New Prescriptions   No medications on file     Sherwood Gambler, MD 10/13/16 [phone]

## 2016-10-13 NOTE — H&P (Signed)
History and Physical    [name] ZDG:[id number] DOB: [birth year] DOA: 10/13/2016   PCP: Cassandria Anger, MD   Patient coming from/Resides with: Private residence  Admission status: Observation/telemetry -it may be medically necessary to stay a minimum 2 midnights to rule out impending and/or unexpected changes in physiologic status that may differ from initial evaluation performed in the ER and/or at time of admission-consider reevaluation of admission status in 24 hours.   Chief Complaint: Syncope 2  HPI: [name] is a 66 y.o. female with medical history significant for asthma with large component, gastritis with GERD, hypothyroidism, IBS, chronic depression with anxiety, migraine headaches and dyslipidemia. She reports developing a migraine headache about 5 days ago which has not responded to her typical migraine medications. For the past 2 nights she has had what sounds like a possible syncopal episode. Each episode she was getting up to go to the bathroom and fell beside her bed and was incontinent of urine without loss of consciousness. She remained quite weak after each episode having to crawl to the dresser to obtain clean clothing. She's been feeling very weak for the past 2 days. After the first episode she thought the Ultram possibly contributed to the symptoms so she quit taking this but she had a second episode last night. She's had nausea associated with the headache and with the syncope symptoms. She was not observed to have any tonic-clonic type activity during each episode and did not appear to have a post ictal phase after each episode. She has not started any new medications. She denies any vomiting or diarrhea. She reports adequate oral intake. CTA of the head and neck in the ER unremarkable. Urinalysis concerning for UTI and this was associated with leukocytosis. Patient reports she is on chronic suppressive medicine w/ trimethoprim. She had bladder  tacking procedure November 2017.  ED Course:  Vital Signs: BP 128/79   Pulse 69   Temp 98.7 F (37.1 C) (Oral)   Resp 14   Ht 5\' 3"  (1.6 m)   Wt 60.8 kg (134 lb)   SpO2 100%   BMI 23.74 kg/m  CT angiogram neck/head: Normal with visualized extensive prior cervical spine surgery without acute osseous abnormality Lab data: Sodium 138, potassium 3.9, chloride 110, CO2 21, glucose 103, BUN 14, providing 1.43, calcium 10.3, albumin 3.0, LFTs not elevated, CK 33, troponin <0.03, white count 14,000 neutrophils 86% and absolute neutrophils 12%, hemoglobin 11, platelets 253,000, urinalysis abnormal and concerning for UTI: Hazy appearance, many bacteria, small hemoglobin, moderate leukocytes, positive nitrite, WBC 6-30 Medications and treatments: Normal saline 1 L 1, Reglan 10 mg IV 1, Benadryl 25 mg IV 1  Review of Systems:  In addition to the HPI above,  No Fever-chills, myalgias or other constitutional symptoms-reports generalized weakness 48 hours No changes with Vision or hearing, new weakness, tingling, numbness in any extremity, dizziness, dysarthria or word finding difficulty, no tremors or seizure activity reported by patient or other family members No problems swallowing food or Liquids, indigestion/reflux, choking or coughing while eating, abdominal pain with or after eating No Chest pain, Cough or Shortness of Breath, palpitations, orthopnea or DOE No Abdominal pain, emesis ,melena,hematochezia, dark tarry stools, constipation No dysuria, malodorous urine, hematuria or flank pain No new skin rashes, lesions, masses or bruises, No new joint pains, aches, swelling or redness No recent unintentional weight gain or loss No polyuria, polydypsia or polyphagia   Past Medical History:  Diagnosis Date  .  Allergic rhinitis, cause unspecified   . Anxiety   . Asthma    chronic cough  . Bipolar I disorder, most recent episode (or current) depressed, severe, without mention of psychotic  behavior   . Blood transfusion without reported diagnosis   . Deep phlebothrombosis, postpartum, unspecified as to episode of care(671.40)   . Depression   . Fibromyalgia   . Gastritis   . GERD (gastroesophageal reflux disease)   . Hyperlipidemia   . IBS (irritable bowel syndrome)   . 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 Migraine headache   . Other and unspecified hyperlipidemia   . Parathyroid adenoma 2014  . Status post chemotherapy 2014    Past Surgical History:  Procedure Laterality Date  . CESAREAN SECTION  1986  . CHOLECYSTECTOMY N/A 05/03/2013   Procedure: LAPAROSCOPIC CHOLECYSTECTOMY WITH INTRAOPERATIVE CHOLANGIOGRAM;  Surgeon: Odis Hollingshead, MD;  Location: [place];  Service: General;  Laterality: N/A;  . COLONOSCOPY    . laprotomy  04/2012   for cancer  . NECK SURGERY  2006   C spine fusion  . PUBOVAGINAL SLING    . VAGINAL HYSTERECTOMY      Social History   Social History  . Marital status: Divorced    Spouse name: N/A  . Number of children: 2  . Years of education: 12   Occupational History  . 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 Smoking status: Never Smoker  . Smokeless tobacco: Never Used  . Alcohol use 1.2 - 1.8 oz/week    2 - 3 Standard drinks or equivalent per week     Comment: rarely  . Drug use: No  . Sexual activity: Not Currently    Partners: Male   Other Topics Concern  . Not on file   Social History Narrative   Married 4 years- divorced; married 10 years- divorced. Serially monogamous relationships. Remarried March '11. 2 sons- '82, '87.    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    Currently does private duty as [place] at [employer]. (June "12).    Lives in her own home.  Currently going through a divorce.    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 Buprenorphine Hcl Other (See Comments)    Severe  headache, confusion  . Morphine And Related Other (See Comments)    Severe headache  . Codeine Other (See Comments)    Disoriented  . Dust Mite Extract Cough  . Molds & Smuts Cough    Family History  Problem Relation Age of Onset  . Coronary artery disease Father   . Heart disease Father   . Hypertension Father   . Irritable bowel syndrome Father   . Arthritis Mother   . Hypertension Mother   . Migraines Mother   . Heart disease Mother   . Diabetes Maternal Grandmother   . Irritable bowel syndrome Sister   . Irritable bowel syndrome Brother   . Colon cancer Neg Hx   . Esophageal cancer Neg Hx   . Rectal cancer Neg Hx   . Stomach cancer Neg Hx      Prior to Admission medications   Medication Sig Start Date End Date Taking? Authorizing Provider  ALPRAZolam Duanne Moron) 0.5 MG tablet Take 1 tablet (0.5 mg total) by mouth 2 (two) times daily as needed for anxiety. 08/11/16   Plotnikov, [name], MD  baclofen (LIORESAL)  10 MG tablet Take 10 mg by mouth as needed for muscle spasms.    [provider]  busPIRone (BUSPAR) 10 MG tablet Take 1 tablet (10 mg total) by mouth 2 (two) times daily. 08/06/16   Plotnikov, [name], MD  Cholecalciferol (VITAMIN D3) 50000 units CAPS 2 caps per week 09/30/16   Plotnikov, [name], MD  Cyanocobalamin (VITAMIN B-12) 1000 MCG SUBL Place 1 tablet (1,000 mcg total) under the tongue daily. 05/05/16   Plotnikov, [name], MD  dicyclomine (BENTYL) 10 MG capsule Take 1 tab twice daily. 08/06/16   Plotnikov, [name], MD  esomeprazole (NEXIUM) 40 MG capsule Take 1 capsule (40 mg total) by mouth 2 (two) times daily before a meal. 10/28/14   Niel Hummer, NP  estradiol (ESTRACE) 1 MG tablet Take 1 tablet (1 mg total) by mouth daily. 10/28/14   Niel Hummer, NP  FLUoxetine (PROZAC) 40 MG capsule Take 1 capsule (40 mg total) by mouth every morning. 07/01/16   Plotnikov, [name], MD  fluticasone (FLONASE) 50 MCG/ACT nasal spray Place 1 spray into both  nostrils 2 (two) times daily.    [provider]  gabapentin (NEURONTIN) 100 MG capsule Take 1 capsule (100 mg total) by mouth 3 (three) times daily. One three times daily 08/11/16   Tanda Rockers, MD  iron polysaccharides (NU-IRON) 150 MG capsule Take 1 capsule (150 mg total) by mouth 2 (two) times daily. 04/14/16   Esterwood, Amy S, PA-C  levothyroxine (SYNTHROID, LEVOTHROID) 100 MCG tablet Take 1 tablet (100 mcg total) by mouth daily before breakfast. 07/01/16   Plotnikov, [name], MD  mirtazapine (REMERON) 30 MG tablet Take 1 tablet (30 mg total) by mouth at bedtime. 07/01/16   Plotnikov, [name], MD  Multiple Vitamins-Minerals (MULTIVITAMIN WITH MINERALS) tablet Take 1 tablet by mouth daily. 10/28/14   Niel Hummer, NP  ondansetron (ZOFRAN) 4 MG tablet Take 4 mg by mouth as needed.  12/27/14   [provider]  Polysacchar Iron-FA-B12 (FERREX 150 FORTE) 150-1-25 MG-MG-MCG CAPS Take 1 capsule by mouth daily. 08/06/16   Plotnikov, [name], MD  SUMAtriptan (IMITREX) 6 MG/0.5ML SOLN injection Inject 6 mg into the skin every 2 (two) hours as needed for migraine or headache. Reported on 08/11/2015    [provider]  topiramate (TOPAMAX) 50 MG tablet Take 1 tablet (50 mg total) by mouth at bedtime. 07/01/16   Plotnikov, [name], MD  traMADol (ULTRAM) 50 MG tablet Take by mouth every 6 (six) hours as needed.    [provider]  trimethoprim (TRIMPEX) 100 MG tablet Take 50 mg by mouth daily.  07/22/15   [provider]    Physical Exam: Vitals:   10/13/16 1345 10/13/16 1400 10/13/16 1415 10/13/16 1430  BP: 114/70 120/75 120/74 128/79  Pulse: 80 74 79 69  Resp: (!) 22 17 19 14   Temp:      TempSrc:      SpO2: 100% 100% 100% 100%  Weight:      Height:          Constitutional: NAD, calm, uncomfortable 2/2 ongoing headache Eyes: PERRL, lids and conjunctivae normal ENMT: Mucous membranes are moist. Posterior pharynx clear of any exudate or  lesions.Normal dentition.  Neck: normal, supple, no masses, no thyromegaly Respiratory: clear to auscultation bilaterally, no wheezing, no crackles. Normal respiratory effort. No accessory muscle use.  Cardiovascular: Regular rate and rhythm, no murmurs / rubs / gallops. No extremity edema. 2+ pedal  pulses. No carotid bruits.  Abdomen: Focal suprapubic tenderness reproduced with palpation, no masses palpated. No hepatosplenomegaly. Bowel sounds positive.  Musculoskeletal: no clubbing / cyanosis. No joint deformity upper and lower extremities. Good ROM, no contractures. Normal muscle tone.  Skin: no rashes, lesions, ulcers. No induration Neurologic: CN 2-12 grossly intact. Sensation intact, DTR normal. Strength 5/5 x all 4 extremities.  Psychiatric: Normal judgment and insight. Alert and oriented x 3. Mildly depressed mood.    Labs on Admission: I have personally reviewed following labs and imaging studies  CBC:  Recent Labs Lab 10/13/16 1321 10/13/16 1337  WBC 14.0*  --   NEUTROABS 12.0*  --   HGB 11.0* 11.2*  HCT 34.1* 33.0*  MCV 90.2  --   PLT 253  --    Basic Metabolic Panel:  Recent Labs Lab 10/13/16 1321 10/13/16 1337  NA 138 140  K 3.9 3.9  CL 110 109  CO2 21*  --   GLUCOSE 103* 102*  BUN 14 17  CREATININE 1.43* 1.40*  CALCIUM 10.3  --    GFR: Estimated Creatinine Clearance: 32.7 mL/min (A) (by C-G formula based on SCr of 1.4 mg/dL (H)). Liver Function Tests:  Recent Labs Lab 10/13/16 1321  AST 24  ALT 16  ALKPHOS 116  BILITOT 0.5  PROT 6.6  ALBUMIN 3.0*   No results for input(s): LIPASE, AMYLASE in the last 168 hours. No results for input(s): AMMONIA in the last 168 hours. Coagulation Profile: No results for input(s): INR, PROTIME in the last 168 hours. Cardiac Enzymes:  Recent Labs Lab 10/13/16 1321  CKTOTAL 33*  TROPONINI <0.03   BNP (last 3 results) No results for input(s): PROBNP in the last 8760 hours. HbA1C: No results for input(s):  HGBA1C in the last 72 hours. CBG: No results for input(s): GLUCAP in the last 168 hours. Lipid Profile: No results for input(s): CHOL, HDL, LDLCALC, TRIG, CHOLHDL, LDLDIRECT in the last 72 hours. Thyroid Function Tests: No results for input(s): TSH, T4TOTAL, FREET4, T3FREE, THYROIDAB in the last 72 hours. Anemia Panel: No results for input(s): VITAMINB12, FOLATE, FERRITIN, TIBC, IRON, RETICCTPCT in the last 72 hours. Urine analysis:    Component Value Date/Time   COLORURINE YELLOW 10/13/2016 1519   APPEARANCEUR HAZY (A) 10/13/2016 1519   LABSPEC 1.009 10/13/2016 1519   PHURINE 7.0 10/13/2016 1519   GLUCOSEU NEGATIVE 10/13/2016 1519   GLUCOSEU NEGATIVE 05/05/2016 1056   HGBUR SMALL (A) 10/13/2016 1519   BILIRUBINUR NEGATIVE 10/13/2016 1519   KETONESUR NEGATIVE 10/13/2016 1519   PROTEINUR NEGATIVE 10/13/2016 1519   UROBILINOGEN 0.2 05/05/2016 1056   NITRITE POSITIVE (A) 10/13/2016 1519   LEUKOCYTESUR MODERATE (A) 10/13/2016 1519   Sepsis Labs: @LABRCNTIP (procalcitonin:4,lacticidven:4) )No results found for this or any previous visit (from the past 240 hour(s)).   Radiological Exams on Admission: Ct Angio Head W Or Wo Contrast  Result Date: 10/13/2016 CLINICAL DATA:  66 year old female with dizziness, posterior head and neck pain for 4 days. Syncopal episode last night. Diffuse weakness. EXAM: CT ANGIOGRAPHY HEAD AND NECK TECHNIQUE: Multidetector CT imaging of the head and neck was performed using the standard protocol during bolus administration of intravenous contrast. Multiplanar CT image reconstructions and MIPs were obtained to evaluate the vascular anatomy. Carotid stenosis measurements (when applicable) are obtained utilizing NASCET criteria, using the distal internal carotid diameter as the denominator. CONTRAST:  50 mL Isovue 370 COMPARISON:  CT cervical myelogram 08/18/2015. Outside brain MRI 04/29/2015. CT head and cervical spine 09/12/2014. Chest CT 09/04/2015.  FINDINGS: CT  HEAD Brain: Cerebral volume is stable since 2016 and remains normal for age. No midline shift, ventriculomegaly, mass effect, evidence of mass lesion, intracranial hemorrhage or evidence of cortically based acute infarction. Gray-white matter differentiation is within normal limits throughout the brain. Calvarium and skull base: Negative. Paranasal sinuses: Clear. Orbits: Visualized orbits and scalp soft tissues are within normal limits. CTA NECK Skeleton: Extensive prior cervical spine surgery with addition of C1-C3 level posterior fusion hardware since the 2017 CT myelogram. No acute osseous abnormality identified. Upper chest: Negative visualized superior mediastinum. Stable small calcified right upper lobe granuloma. Minor dependent atelectasis. Other neck: Negative.  No cervical lymphadenopathy. Aortic arch: 3 vessel arch configuration.  No arch atherosclerosis. Right carotid system: Mildly tortuous brachiocephalic artery with a mildly kinked appearance at the right thoracic inlet. Normal right CCA origin. Normal right CCA and right carotid bifurcation. Normal cervical right ICA aside from mild tortuosity. Left carotid system: Normal left CCA origin. Normal left CCA. Normal left carotid bifurcation. Normal cervical left ICA aside from mild tortuosity. Vertebral arteries: No proximal right subclavian atherosclerosis or stenosis. Normal right vertebral artery origin. Mildly dolichoectatic but otherwise normal cervical right vertebral artery. No proximal left subclavian atherosclerosis or stenosis. Normal left vertebral artery origin. Mildly tortuous but otherwise normal left vertebral artery to the skullbase. CTA HEAD Posterior circulation: Normal distal vertebral arteries. Normal PICA origins and vertebrobasilar junction. Basilar artery, AICA origins, SCA origins and PCA origins are patent without stenosis. Posterior communicating arteries are diminutive or absent. Bilateral PCA branches are normal. Anterior  circulation: Both ICA siphons are patent with minimal calcified plaque on the left. No siphon stenosis. Normal carotid termini. Normal MCA and ACA origins. Mildly ectatic anterior communicating artery. Bilateral ACA branches are within normal limits. Left MCA M1 segment, trifurcation, and left MCA branches are within normal limits. Right MCA M1 segment, trifurcation, and right MCA branches are within normal limits. Venous sinuses: Patent. Anatomic variants: None. Delayed phase: No abnormal enhancement identified. Review of the MIP images confirms the above findings IMPRESSION: 1. Normal arterial findings on CTA head and neck except for tortuosity of the cervical carotid and vertebral arteries. 2.  Normal for age CT appearance of the brain. 3. Extensive prior cervical spine surgery. No acute osseous abnormality identified. Electronically Signed   By: Genevie Ann M.D.   On: 10/13/2016 15:32   Ct Angio Neck W And/or Wo Contrast  Result Date: 10/13/2016 CLINICAL DATA:  66 year old female with dizziness, posterior head and neck pain for 4 days. Syncopal episode last night. Diffuse weakness. EXAM: CT ANGIOGRAPHY HEAD AND NECK TECHNIQUE: Multidetector CT imaging of the head and neck was performed using the standard protocol during bolus administration of intravenous contrast. Multiplanar CT image reconstructions and MIPs were obtained to evaluate the vascular anatomy. Carotid stenosis measurements (when applicable) are obtained utilizing NASCET criteria, using the distal internal carotid diameter as the denominator. CONTRAST:  50 mL Isovue 370 COMPARISON:  CT cervical myelogram 08/18/2015. Outside brain MRI 04/29/2015. CT head and cervical spine 09/12/2014. Chest CT 09/04/2015. FINDINGS: CT HEAD Brain: Cerebral volume is stable since 2016 and remains normal for age. No midline shift, ventriculomegaly, mass effect, evidence of mass lesion, intracranial hemorrhage or evidence of cortically based acute infarction. Gray-white  matter differentiation is within normal limits throughout the brain. Calvarium and skull base: Negative. Paranasal sinuses: Clear. Orbits: Visualized orbits and scalp soft tissues are within normal limits. CTA NECK Skeleton: Extensive prior cervical spine surgery with addition of C1-C3  level posterior fusion hardware since the 2017 CT myelogram. No acute osseous abnormality identified. Upper chest: Negative visualized superior mediastinum. Stable small calcified right upper lobe granuloma. Minor dependent atelectasis. Other neck: Negative.  No cervical lymphadenopathy. Aortic arch: 3 vessel arch configuration.  No arch atherosclerosis. Right carotid system: Mildly tortuous brachiocephalic artery with a mildly kinked appearance at the right thoracic inlet. Normal right CCA origin. Normal right CCA and right carotid bifurcation. Normal cervical right ICA aside from mild tortuosity. Left carotid system: Normal left CCA origin. Normal left CCA. Normal left carotid bifurcation. Normal cervical left ICA aside from mild tortuosity. Vertebral arteries: No proximal right subclavian atherosclerosis or stenosis. Normal right vertebral artery origin. Mildly dolichoectatic but otherwise normal cervical right vertebral artery. No proximal left subclavian atherosclerosis or stenosis. Normal left vertebral artery origin. Mildly tortuous but otherwise normal left vertebral artery to the skullbase. CTA HEAD Posterior circulation: Normal distal vertebral arteries. Normal PICA origins and vertebrobasilar junction. Basilar artery, AICA origins, SCA origins and PCA origins are patent without stenosis. Posterior communicating arteries are diminutive or absent. Bilateral PCA branches are normal. Anterior circulation: Both ICA siphons are patent with minimal calcified plaque on the left. No siphon stenosis. Normal carotid termini. Normal MCA and ACA origins. Mildly ectatic anterior communicating artery. Bilateral ACA branches are within  normal limits. Left MCA M1 segment, trifurcation, and left MCA branches are within normal limits. Right MCA M1 segment, trifurcation, and right MCA branches are within normal limits. Venous sinuses: Patent. Anatomic variants: None. Delayed phase: No abnormal enhancement identified. Review of the MIP images confirms the above findings IMPRESSION: 1. Normal arterial findings on CTA head and neck except for tortuosity of the cervical carotid and vertebral arteries. 2.  Normal for age CT appearance of the brain. 3. Extensive prior cervical spine surgery. No acute osseous abnormality identified. Electronically Signed   By: Genevie Ann M.D.   On: 10/13/2016 15:32    EKG: (Independently reviewed) sinus rhythm with ventricular rate 81 beats per neck, QTC 411 ms, elevated J-point inferior leads, normal R-wave regurgitation, no acute ischemic changes. EKG unchanged from previous EKG from 2009  Assessment/Plan Principal Problem:   Syncope -Patient presents with 2 episodes of syncope, unwitnessed at home, associated with bladder incontinence without witnessed seizure-like activity or postictal phase; preceded by 5 days of unresolved migraine headache in the setting of acute UTI -Echocardiogram -Orthostatic vital signs/IV fluid hydration -Mobilize with assistance only -For completeness of exam obtain EEG (CK normal) -Possibly secondary to UTI symptoms -Neuro checks every 4 hours  Active Problems:   Migraine headache -Unresolved after 5 days of typical home medications (Topamax, Ultram, Neurontin and Imitrex) -Decadron 10 mg IV 1 -Toradol 15 mg IV scheduled every 8 hours 4 doses w/ PPI every 12 hours -Continue home medications -Environmental adjustments/ decreased stimulation -CT imaging and neurological exam unremarkable    Asthma -Currently asymptomatic -Continue home MDIs    UTI (urinary tract infection) -Patient has leukocytosis and reproducible suprapubic pain on exam with abnormal  urinalysis -Obtain urine culture -IV Rocephin --Reports takes trimethoprim at home for UTI prophylaxis; underwent bladder tacking surgery November 2017    CKD (chronic kidney disease) stage 3, GFR 30-59 ml/min -Renal function stable with baseline -Continue IV fluids while receiving IV Toradol -Follow electrolytes    Anxiety/ Chronic depression -Continue preadmission Xanax, BuSpar, Prozac, Remeron    Hyperlipidemia -Not on pharmacotherapy prior to admission    Hypothyroidism -Continue Synthroid      DVT prophylaxis: Lovenox  Code Status: Full Family Communication: Family at bedside Disposition Plan: Home Consults called: None     ELLIS,ALLISON L. ANP-BC [facility] Pager [phone]   If 7PM-7AM, please contact night-coverage [email or web address] Password [email or web address]  10/13/2016, 4:26 PM

## 2016-10-13 NOTE — ED Notes (Signed)
Pt placed on bedpan, unable to provide urine sample at this time.   Call light and family member at bedside.

## 2016-10-14 ENCOUNTER — Observation Stay (HOSPITAL_COMMUNITY): Payer: Medicare Other

## 2016-10-14 DIAGNOSIS — E039 Hypothyroidism, unspecified: Secondary | ICD-10-CM

## 2016-10-14 DIAGNOSIS — N3000 Acute cystitis without hematuria: Secondary | ICD-10-CM

## 2016-10-14 DIAGNOSIS — K219 Gastro-esophageal reflux disease without esophagitis: Secondary | ICD-10-CM | POA: Diagnosis present

## 2016-10-14 DIAGNOSIS — R0600 Dyspnea, unspecified: Secondary | ICD-10-CM | POA: Diagnosis not present

## 2016-10-14 DIAGNOSIS — R0602 Shortness of breath: Secondary | ICD-10-CM | POA: Diagnosis not present

## 2016-10-14 DIAGNOSIS — E785 Hyperlipidemia, unspecified: Secondary | ICD-10-CM | POA: Diagnosis not present

## 2016-10-14 DIAGNOSIS — N183 Chronic kidney disease, stage 3 (moderate): Secondary | ICD-10-CM

## 2016-10-14 DIAGNOSIS — E872 Acidosis: Secondary | ICD-10-CM | POA: Diagnosis not present

## 2016-10-14 DIAGNOSIS — K589 Irritable bowel syndrome without diarrhea: Secondary | ICD-10-CM | POA: Diagnosis present

## 2016-10-14 DIAGNOSIS — R55 Syncope and collapse: Secondary | ICD-10-CM | POA: Diagnosis not present

## 2016-10-14 DIAGNOSIS — R32 Unspecified urinary incontinence: Secondary | ICD-10-CM | POA: Diagnosis present

## 2016-10-14 DIAGNOSIS — F419 Anxiety disorder, unspecified: Secondary | ICD-10-CM | POA: Diagnosis present

## 2016-10-14 DIAGNOSIS — F319 Bipolar disorder, unspecified: Secondary | ICD-10-CM | POA: Diagnosis present

## 2016-10-14 DIAGNOSIS — G43909 Migraine, unspecified, not intractable, without status migrainosus: Secondary | ICD-10-CM | POA: Diagnosis present

## 2016-10-14 DIAGNOSIS — J45909 Unspecified asthma, uncomplicated: Secondary | ICD-10-CM | POA: Diagnosis not present

## 2016-10-14 DIAGNOSIS — D649 Anemia, unspecified: Secondary | ICD-10-CM | POA: Diagnosis present

## 2016-10-14 DIAGNOSIS — N390 Urinary tract infection, site not specified: Secondary | ICD-10-CM | POA: Diagnosis not present

## 2016-10-14 LAB — CBC
HCT: 32.3 % — ABNORMAL LOW (ref 36.0–46.0)
Hemoglobin: 10.3 g/dL — ABNORMAL LOW (ref 12.0–15.0)
MCH: 28.5 pg (ref 26.0–34.0)
MCHC: 31.9 g/dL (ref 30.0–36.0)
MCV: 89.5 fL (ref 78.0–100.0)
Platelets: 248 10*3/uL (ref 150–400)
RBC: 3.61 MIL/uL — ABNORMAL LOW (ref 3.87–5.11)
RDW: 14.7 % (ref 11.5–15.5)
WBC: 11.1 10*3/uL — ABNORMAL HIGH (ref 4.0–10.5)

## 2016-10-14 LAB — BASIC METABOLIC PANEL
Anion gap: 8 (ref 5–15)
BUN: 16 mg/dL (ref 6–20)
CO2: 16 mmol/L — ABNORMAL LOW (ref 22–32)
Calcium: 9.7 mg/dL (ref 8.9–10.3)
Chloride: 115 mmol/L — ABNORMAL HIGH (ref 101–111)
Creatinine, Ser: 1.18 mg/dL — ABNORMAL HIGH (ref 0.44–1.00)
GFR calc Af Amer: 54 mL/min — ABNORMAL LOW (ref >60)
GFR calc non Af Amer: 47 mL/min — ABNORMAL LOW (ref >60)
Glucose, Bld: 135 mg/dL — ABNORMAL HIGH (ref 65–99)
Potassium: 4.2 mmol/L (ref 3.5–5.1)
Sodium: 139 mmol/L (ref 135–145)

## 2016-10-14 LAB — GLUCOSE, CAPILLARY: Glucose-Capillary: 143 mg/dL — ABNORMAL HIGH (ref 65–99)

## 2016-10-14 MED ORDER — PANTOPRAZOLE SODIUM 40 MG PO TBEC
40.0000 mg | DELAYED_RELEASE_TABLET | Freq: Two times a day (BID) | ORAL | Status: DC
  Administered 2016-10-14 – 2016-10-16 (×4): 40 mg via ORAL
  Filled 2016-10-14 (×4): qty 1

## 2016-10-14 MED ORDER — KETOROLAC TROMETHAMINE 15 MG/ML IJ SOLN
15.0000 mg | Freq: Three times a day (TID) | INTRAMUSCULAR | Status: DC | PRN
  Administered 2016-10-15 (×2): 15 mg via INTRAVENOUS
  Filled 2016-10-14 (×2): qty 1

## 2016-10-14 NOTE — Procedures (Signed)
ELECTROENCEPHALOGRAM REPORT  Date of Study: 10/13/2016 (assigned to this provider on 10/14/2016)  Patient's Name: [name] MRN: [id number] Date of Birth: 1950/06/17  Referring Provider: Erin Hearing, NP  Clinical History: This is a 66 year old woman with weakness, urinary incontinence.  Medications: Topamax gabapentin (NEURONTIN) 100 MG capsule Take 1 capsule (100 mg total) by mouth 3 (three) times daily. One three times daily ALPRAZolam (XANAX) 0.5 MG tablet Take 1 tablet (0.5 mg total) by mouth 2 (two) times daily as needed for anxiety. baclofen (LIORESAL) 10 MG tablet Take 10 mg by mouth as needed for muscle spasms. busPIRone (BUSPAR) 10 MG tablet Take 1 tablet (10 mg total) by mouth 2 (two) times daily. Cholecalciferol (VITAMIN D3) 50000 units CAPS 2 caps per week Cyanocobalamin (VITAMIN B-12) 1000 MCG SUBL Place 1 tablet (1,000 mcg total) under the tongue daily. dicyclomine (BENTYL) 10 MG capsule Take 1 tab twice daily. esomeprazole (NEXIUM) 40 MG capsule Take 1 capsule (40 mg total) by mouth 2 (two) times daily before a meal. estradiol (ESTRACE) 1 MG tablet Take 1 tablet (1 mg total) by mouth daily. FLUoxetine (PROZAC) 40 MG capsule Take 1 capsule (40 mg total) by mouth every morning. fluticasone (FLONASE) 50 MCG/ACT nasal spray Place 1 spray into both nostrils 2 (two) times daily. iron polysaccharides (NU-IRON) 150 MG capsule Take 1 capsule (150 mg total) by mouth 2 (two) times daily. levothyroxine (SYNTHROID, LEVOTHROID) 100 MCG tablet Take 1 tablet (100 mcg total) by mouth daily before breakfast. mirtazapine (REMERON) 30 MG tablet Take 1 tablet (30 mg total) by mouth at bedtime. Multiple Vitamins-Minerals (MULTIVITAMIN WITH MINERALS) tablet Take 1 tablet by mouth daily. ondansetron (ZOFRAN) 4 MG tablet Take 4 mg by mouth as needed.  Polysacchar Iron-FA-B12 (FERREX 150 FORTE) 150-1-25 MG-MG-MCG CAPS Take 1 capsule by mouth daily. SUMAtriptan (IMITREX) 6 MG/0.5ML  SOLN injection Inject 6 mg into the skin every 2 (two) hours as needed for migraine or headache. traMADol (ULTRAM) 50 MG tablet Take by mouth every 6 (six) hours as needed. trimethoprim (TRIMPEX) 100 MG tablet   Technical Summary: A multichannel digital EEG recording measured by the international 10-20 system with electrodes applied with paste and impedances below 5000 ohms performed in our laboratory with EKG monitoring in an awake patient.  Hyperventilation was not performed. Photic stimulation was performed.  The digital EEG was referentially recorded, reformatted, and digitally filtered in a variety of bipolar and referential montages for optimal display.    Description: The patient is awake during the recording.  During maximal wakefulness, there is a symmetric, medium voltage 9.5 Hz posterior dominant rhythm that attenuates with eye opening.  The record is symmetric.  Sleep was not captured. Photic stimulation did not elicit any abnormalities.  There were no epileptiform discharges or electrographic seizures seen.    EKG lead was unremarkable.  Impression: This awake EEG is normal.    Clinical Correlation: A normal EEG does not exclude a clinical diagnosis of epilepsy.  If further clinical questions remain, repeat wake and sleep EEG may be helpful.  Clinical correlation is advised.   Ellouise Newer, M.D.

## 2016-10-14 NOTE — Progress Notes (Signed)
[facility] PROGRESS NOTE  [name] CXK:[id number] DOB: 04/01/1951 DOA: 10/13/2016  PCP: Cassandria Anger, MD  Brief History/Interval Summary: 66 year old Caucasian female with a past medical history of asthma, gastritis, GERD, hypothyroidism, irritable bowel syndrome, chronic depression with anxiety, migraine headaches and dyslipidemia presented with 2 episodes of syncope. She had urinary incontinence associated with these episodes. She started feeling unwell about 5 days prior to hospitalization with the onset of migraine headaches. There is concern for seizure type activity. Patient was hospitalized for further management. She was also found to have a urinary tract infection.  Reason for Visit: Syncope  Consultants: Neurology  Procedures:   EEG Impression: This awake EEG is normal.    Transthoracic echocardiogram is pending  Antibiotics: Ceftriaxone  Subjective/Interval History: Patient complains of feeling sore all over. Continues to have a headache. Denies any chest pain, shortness of breath. No palpitations.  ROS: Some nausea but no vomiting  Objective:  Vital Signs  Vitals:   10/14/16 0027 10/14/16 0547 10/14/16 0822 10/14/16 1142  BP: 123/64 136/70 (!) 141/72 (!) 116/59  Pulse: 61 65 62 67  Resp: 16 16    Temp: 97.5 F (36.4 C) 97.4 F (36.3 C) 97.6 F (36.4 C) 97.6 F (36.4 C)  TempSrc: Oral Oral Oral Oral  SpO2: 97% 98% 100% 99%  Weight:  60 kg (132 lb 3.2 oz)    Height:        Intake/Output Summary (Last 24 hours) at 10/14/16 1343 Last data filed at 10/14/16 1012  Gross per 24 hour  Intake           2377.5 ml  Output              700 ml  Net           1677.5 ml   Filed Weights   10/13/16 1800 10/13/16 1820 10/14/16 0547  Weight: 62.3 kg (137 lb 4.8 oz) 62.3 kg (137 lb 4.8 oz) 60 kg (132 lb 3.2 oz)    General appearance: alert, cooperative, appears stated age and no distress Resp: clear to auscultation bilaterally Cardio:  regular rate and rhythm, S1, S2 normal, no murmur, click, rub or gallop GI: soft, non-tender; bowel sounds normal; no masses,  no organomegaly Extremities: extremities normal, atraumatic, no cyanosis or edema Neurologic: Awake and alert. Oriented 3. No focal neurological deficits noted  Lab Results:  Data Reviewed: I have personally reviewed following labs and imaging studies  CBC:  Recent Labs Lab 10/13/16 1321 10/13/16 1337 10/14/16 0539  WBC 14.0*  --  11.1*  NEUTROABS 12.0*  --   --   HGB 11.0* 11.2* 10.3*  HCT 34.1* 33.0* 32.3*  MCV 90.2  --  89.5  PLT 253  --  970    Basic Metabolic Panel:  Recent Labs Lab 10/13/16 1321 10/13/16 1337 10/13/16 1917 10/14/16 0539  NA 138 140  --  139  K 3.9 3.9  --  4.2  CL 110 109  --  115*  CO2 21*  --   --  16*  GLUCOSE 103* 102*  --  135*  BUN 14 17  --  16  CREATININE 1.43* 1.40*  --  1.18*  CALCIUM 10.3  --   --  9.7  MG  --   --  1.7  --   PHOS  --   --  1.7*  --     GFR: Estimated Creatinine Clearance: 38.8 mL/min (A) (by C-G formula based on  SCr of 1.18 mg/dL (H)).  Liver Function Tests:  Recent Labs Lab 10/13/16 1321  AST 24  ALT 16  ALKPHOS 116  BILITOT 0.5  PROT 6.6  ALBUMIN 3.0*    Cardiac Enzymes:  Recent Labs Lab 10/13/16 1321  CKTOTAL 33*  TROPONINI <0.03    CBG:  Recent Labs Lab 10/14/16 0627  GLUCAP 143*      Radiology Studies: Ct Angio Head W Or Wo Contrast  Result Date: 10/13/2016 CLINICAL DATA:  66 year old female with dizziness, posterior head and neck pain for 4 days. Syncopal episode last night. Diffuse weakness. EXAM: CT ANGIOGRAPHY HEAD AND NECK TECHNIQUE: Multidetector CT imaging of the head and neck was performed using the standard protocol during bolus administration of intravenous contrast. Multiplanar CT image reconstructions and MIPs were obtained to evaluate the vascular anatomy. Carotid stenosis measurements (when applicable) are obtained utilizing NASCET  criteria, using the distal internal carotid diameter as the denominator. CONTRAST:  50 mL Isovue 370 COMPARISON:  CT cervical myelogram 08/18/2015. Outside brain MRI 04/29/2015. CT head and cervical spine 09/12/2014. Chest CT 09/04/2015. FINDINGS: CT HEAD Brain: Cerebral volume is stable since 2016 and remains normal for age. No midline shift, ventriculomegaly, mass effect, evidence of mass lesion, intracranial hemorrhage or evidence of cortically based acute infarction. Gray-white matter differentiation is within normal limits throughout the brain. Calvarium and skull base: Negative. Paranasal sinuses: Clear. Orbits: Visualized orbits and scalp soft tissues are within normal limits. CTA NECK Skeleton: Extensive prior cervical spine surgery with addition of C1-C3 level posterior fusion hardware since the 2017 CT myelogram. No acute osseous abnormality identified. Upper chest: Negative visualized superior mediastinum. Stable small calcified right upper lobe granuloma. Minor dependent atelectasis. Other neck: Negative.  No cervical lymphadenopathy. Aortic arch: 3 vessel arch configuration.  No arch atherosclerosis. Right carotid system: Mildly tortuous brachiocephalic artery with a mildly kinked appearance at the right thoracic inlet. Normal right CCA origin. Normal right CCA and right carotid bifurcation. Normal cervical right ICA aside from mild tortuosity. Left carotid system: Normal left CCA origin. Normal left CCA. Normal left carotid bifurcation. Normal cervical left ICA aside from mild tortuosity. Vertebral arteries: No proximal right subclavian atherosclerosis or stenosis. Normal right vertebral artery origin. Mildly dolichoectatic but otherwise normal cervical right vertebral artery. No proximal left subclavian atherosclerosis or stenosis. Normal left vertebral artery origin. Mildly tortuous but otherwise normal left vertebral artery to the skullbase. CTA HEAD Posterior circulation: Normal distal vertebral  arteries. Normal PICA origins and vertebrobasilar junction. Basilar artery, AICA origins, SCA origins and PCA origins are patent without stenosis. Posterior communicating arteries are diminutive or absent. Bilateral PCA branches are normal. Anterior circulation: Both ICA siphons are patent with minimal calcified plaque on the left. No siphon stenosis. Normal carotid termini. Normal MCA and ACA origins. Mildly ectatic anterior communicating artery. Bilateral ACA branches are within normal limits. Left MCA M1 segment, trifurcation, and left MCA branches are within normal limits. Right MCA M1 segment, trifurcation, and right MCA branches are within normal limits. Venous sinuses: Patent. Anatomic variants: None. Delayed phase: No abnormal enhancement identified. Review of the MIP images confirms the above findings IMPRESSION: 1. Normal arterial findings on CTA head and neck except for tortuosity of the cervical carotid and vertebral arteries. 2.  Normal for age CT appearance of the brain. 3. Extensive prior cervical spine surgery. No acute osseous abnormality identified. Electronically Signed   By: Genevie Ann M.D.   On: 10/13/2016 15:32   Ct Angio Neck W And/or Wo  Contrast  Result Date: 10/13/2016 CLINICAL DATA:  66 year old female with dizziness, posterior head and neck pain for 4 days. Syncopal episode last night. Diffuse weakness. EXAM: CT ANGIOGRAPHY HEAD AND NECK TECHNIQUE: Multidetector CT imaging of the head and neck was performed using the standard protocol during bolus administration of intravenous contrast. Multiplanar CT image reconstructions and MIPs were obtained to evaluate the vascular anatomy. Carotid stenosis measurements (when applicable) are obtained utilizing NASCET criteria, using the distal internal carotid diameter as the denominator. CONTRAST:  50 mL Isovue 370 COMPARISON:  CT cervical myelogram 08/18/2015. Outside brain MRI 04/29/2015. CT head and cervical spine 09/12/2014. Chest CT 09/04/2015.  FINDINGS: CT HEAD Brain: Cerebral volume is stable since 2016 and remains normal for age. No midline shift, ventriculomegaly, mass effect, evidence of mass lesion, intracranial hemorrhage or evidence of cortically based acute infarction. Gray-white matter differentiation is within normal limits throughout the brain. Calvarium and skull base: Negative. Paranasal sinuses: Clear. Orbits: Visualized orbits and scalp soft tissues are within normal limits. CTA NECK Skeleton: Extensive prior cervical spine surgery with addition of C1-C3 level posterior fusion hardware since the 2017 CT myelogram. No acute osseous abnormality identified. Upper chest: Negative visualized superior mediastinum. Stable small calcified right upper lobe granuloma. Minor dependent atelectasis. Other neck: Negative.  No cervical lymphadenopathy. Aortic arch: 3 vessel arch configuration.  No arch atherosclerosis. Right carotid system: Mildly tortuous brachiocephalic artery with a mildly kinked appearance at the right thoracic inlet. Normal right CCA origin. Normal right CCA and right carotid bifurcation. Normal cervical right ICA aside from mild tortuosity. Left carotid system: Normal left CCA origin. Normal left CCA. Normal left carotid bifurcation. Normal cervical left ICA aside from mild tortuosity. Vertebral arteries: No proximal right subclavian atherosclerosis or stenosis. Normal right vertebral artery origin. Mildly dolichoectatic but otherwise normal cervical right vertebral artery. No proximal left subclavian atherosclerosis or stenosis. Normal left vertebral artery origin. Mildly tortuous but otherwise normal left vertebral artery to the skullbase. CTA HEAD Posterior circulation: Normal distal vertebral arteries. Normal PICA origins and vertebrobasilar junction. Basilar artery, AICA origins, SCA origins and PCA origins are patent without stenosis. Posterior communicating arteries are diminutive or absent. Bilateral PCA branches are normal.  Anterior circulation: Both ICA siphons are patent with minimal calcified plaque on the left. No siphon stenosis. Normal carotid termini. Normal MCA and ACA origins. Mildly ectatic anterior communicating artery. Bilateral ACA branches are within normal limits. Left MCA M1 segment, trifurcation, and left MCA branches are within normal limits. Right MCA M1 segment, trifurcation, and right MCA branches are within normal limits. Venous sinuses: Patent. Anatomic variants: None. Delayed phase: No abnormal enhancement identified. Review of the MIP images confirms the above findings IMPRESSION: 1. Normal arterial findings on CTA head and neck except for tortuosity of the cervical carotid and vertebral arteries. 2.  Normal for age CT appearance of the brain. 3. Extensive prior cervical spine surgery. No acute osseous abnormality identified. Electronically Signed   By: Genevie Ann M.D.   On: 10/13/2016 15:32     Medications:  Scheduled: . busPIRone  10 mg Oral BID  . enoxaparin (LOVENOX) injection  40 mg Subcutaneous Q24H  . estradiol  1 mg Oral Daily  . FLUoxetine  40 mg Oral Daily  . fluticasone  1 spray Each Nare BID  . gabapentin  100 mg Oral TID  . ketorolac  15 mg Intravenous Q8H  . levothyroxine  100 mcg Oral QAC breakfast  . mirtazapine  30 mg Oral QHS  .  pantoprazole (PROTONIX) IV  40 mg Intravenous Q12H  . sodium chloride flush  3 mL Intravenous Q12H  . topiramate  50 mg Oral QHS   Continuous: . sodium chloride 75 mL/hr at 10/14/16 0829  . cefTRIAXone (ROCEPHIN)  IV 1 g (10/13/16 1722)   [id number] **OR** acetaminophen, ALPRAZolam, baclofen, ondansetron **OR** ondansetron (ZOFRAN) IV, traMADol  Assessment/Plan:  Principal Problem:   Syncope Active Problems:   Migraine headache   Asthma   Anxiety   Hyperlipidemia   UTI (urinary tract infection)   CKD (chronic kidney disease) stage 3, GFR 30-59 ml/min   Chronic depression   Hypothyroidism    Syncope Patient had 2 episodes of  syncope at home. These were unwitnessed. Associated with bladder incontinence. Differential diagnoses include seizure activity, arrhythmia. Echocardiogram is pending. Continue to monitor on telemetry. Patient requesting neurology input. Neurology has been consulted. Continue IV fluids for now. Check orthostatics.  Migraine headaches Ongoing for about 5 days. Followed by neurology as outpatient. She has been taking her home medications including Topamax, tramadol Neurontin and Imitrex without much relief. Patient was given dexamethasone in the ED. CT scan did not show any acute neurological process. We will appreciate neurology input regarding this as well.  Urinary tract infection UA was noted to be abnormal. Patient had leukocytosis. She was started on IV Rocephin. Could this have contributed to her presentation?  History of asthma. Stable  History of chronic kidney disease stage III Renal function is stable at baseline. Mild non-anion gap metabolic acidosis is noted. Continue to monitor.  History of hypothyroidism.  Continue Synthroid  DVT Prophylaxis: Lovenox    Code Status: Full code  Family Communication: Discussed with the patient  Disposition Plan: Management as outlined above.     LOS: 0 days   [facility] Pager [phone] 10/14/2016, 1:43 PM  If 7PM-7AM, please contact night-coverage at [email or web address], password [facility]

## 2016-10-14 NOTE — Progress Notes (Signed)
Patient requesting for Toradol for neck pain. MD notified and ordered as PRN.. Baclofen  10 mg given for spasm with  good effect noted.

## 2016-10-14 NOTE — Consult Note (Signed)
NEURO HOSPITALIST CONSULT NOTE   Requesting physician: Dr. Maryland Pink  Reason for Consult: Syncope vs seizure  History obtained from:  Patient and Chart   HPI:                                                                                                                                          [name] is an 66 y.o. female who presented to [facility] via EMS on [phone] following syncopal-type episodes during the night on [id number] and [id number]. On both occasions, [name] had gotten out of bed in the middle of the night and collapsed and woke finding she had lost control of her bladder and was unable to move (she was "paralyzed") for an unknown period of time following. She reports no preceding symptoms and has no idea how long she was unconscious for.  [name] reports a stiff neck and migraine x 4-5 days following the initial episode. She believes the first episode may have been secondary to taking too much medication to relieve her discomfort (including xanex, tramadol, baclofen). She reports that she struck the back of her head and and left elbow during the second fall.  Past medical history is significant for anxiety, migraine, bipolar I, fibromyalgia (not offically diagnosed per patient). No history of seizures or seizure-like activity that she is aware of.  Pertinent imaging CTA head/neck [id number] - Negative EEG [id number] - Negative Orthostatics within normal limits  Pertinent medications Topamax 50mg  qhs Remeron 25mh qhs Prozac 40mg  daily Xanax 0.5mg  PRN Tramadol 50mg  PRN Baclofen 10mg  PRN  Past Medical History:  Diagnosis Date  . Allergic rhinitis, cause unspecified   . Anxiety   . Asthma    chronic cough  . Bipolar I disorder, most recent episode (or current) depressed, severe, without mention of psychotic behavior   . Blood transfusion without reported diagnosis   . Deep phlebothrombosis,  postpartum, unspecified as to episode of care(671.40)   . Depression   . Fibromyalgia   . Gastritis   . GERD (gastroesophageal reflux disease)   . Hyperlipidemia   . IBS (irritable bowel syndrome)   . 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 Migraine headache   . Other and unspecified hyperlipidemia   . Parathyroid adenoma 2014  . Status post chemotherapy 2014    Past Surgical History:  Procedure Laterality Date  . CESAREAN SECTION  1986  . CHOLECYSTECTOMY N/A 05/03/2013   Procedure: LAPAROSCOPIC CHOLECYSTECTOMY WITH INTRAOPERATIVE CHOLANGIOGRAM;  Surgeon: Odis Hollingshead, MD;  Location: [facility];  Service: General;  Laterality: N/A;  . COLONOSCOPY    . laprotomy  04/2012   for cancer  . NECK SURGERY  2006   C spine fusion  . PUBOVAGINAL SLING    .  VAGINAL HYSTERECTOMY      Family History  Problem Relation Age of Onset  . Coronary artery disease Father   . Heart disease Father   . Hypertension Father   . Irritable bowel syndrome Father   . Arthritis Mother   . Hypertension Mother   . Migraines Mother   . Heart disease Mother   . Diabetes Maternal Grandmother   . Irritable bowel syndrome Sister   . Irritable bowel syndrome Brother   . Colon cancer Neg Hx   . Esophageal cancer Neg Hx   . Rectal cancer Neg Hx   . Stomach cancer Neg Hx     Social History:  reports that she has never smoked. She has never used smokeless tobacco. She reports that she drinks about 1.2 - 1.8 oz of alcohol per week . She reports that she does not use drugs.  Allergies  Allergen Reactions  . Buprenorphine Hcl Other (See Comments)    Severe headache, confusion  . Morphine And Related Other (See Comments)    Severe headache  . Codeine Other (See Comments)    Disoriented  . Dust Mite Extract Cough  . Molds & Smuts Cough    MEDICATIONS:                                                                                                                   Current  Facility-Administered Medications for the 10/13/16 encounter [facility] Encounter)  Medication  . 0.9 %  sodium chloride infusion   Current Meds  Medication Sig  . ALPRAZolam (XANAX) 0.5 MG tablet Take 1 tablet (0.5 mg total) by mouth 2 (two) times daily as needed for anxiety.  . baclofen (LIORESAL) 10 MG tablet Take 10 mg by mouth daily as needed for muscle spasms.   . busPIRone (BUSPAR) 10 MG tablet Take 1 tablet (10 mg total) by mouth 2 (two) times daily.  . Cholecalciferol (D3-1000) 1000 units capsule Take 1,000 Units by mouth daily.  . Cyanocobalamin (VITAMIN B-12) 1000 MCG SUBL Place 1 tablet (1,000 mcg total) under the tongue daily.  Marland Kitchen dicyclomine (BENTYL) 10 MG capsule Take 1 tab twice daily. (Patient taking differently: Take 10 mg by mouth 2 (two) times daily. Take 1 tab twice daily.)  . esomeprazole (NEXIUM) 40 MG capsule Take 1 capsule (40 mg total) by mouth 2 (two) times daily before a meal.  . estradiol (ESTRACE) 1 MG tablet Take 1 tablet (1 mg total) by mouth daily.  . fexofenadine (ALLEGRA) 180 MG tablet Take 180 mg by mouth daily as needed for allergies or rhinitis.   Marland Kitchen FLUoxetine (PROZAC) 40 MG capsule Take 1 capsule (40 mg total) by mouth every morning.  . fluticasone (FLONASE) 50 MCG/ACT nasal spray Place 1 spray into both nostrils 2 (two) times daily as needed for allergies or rhinitis.   Marland Kitchen iron polysaccharides (FERREX 150) 150 MG capsule Take 150 mg by mouth 2 (two) times daily.  Marland Kitchen levothyroxine (SYNTHROID, LEVOTHROID) 100 MCG tablet Take 1 tablet (100 mcg  total) by mouth daily before breakfast.  . mirtazapine (REMERON) 30 MG tablet Take 1 tablet (30 mg total) by mouth at bedtime.  . Multiple Vitamins-Minerals (MULTIVITAMIN WITH MINERALS) tablet Take 1 tablet by mouth daily.  . naproxen sodium (ANAPROX) 220 MG tablet Take 220 mg by mouth 2 (two) times daily as needed (for pain or headaches).   . ondansetron (ZOFRAN-ODT) 4 MG disintegrating tablet Take 4 mg by mouth every 8  (eight) hours as needed for nausea or vomiting.   . progesterone (PROMETRIUM) 100 MG capsule Take 100 mg by mouth at bedtime.  . topiramate (TOPAMAX) 50 MG tablet Take 1 tablet (50 mg total) by mouth at bedtime.  . traMADol (ULTRAM) 50 MG tablet Take 50 mg by mouth every 6 (six) hours as needed (for pain).   Marland Kitchen trimethoprim (TRIMPEX) 100 MG tablet Take 100 mg by mouth daily.     Review Of Systems:                                                                                                           History obtained from the patient  General: Negative for fatigue, fever Psychological: As noted in HPI Ophthalmic: Negative for blurry or double vision, loss of vision in any field ENT: Negative for abrupt loss of hearing or vertigo Respiratory: Negative for cough, shortness of breath or wheezing Cardiovascular: Negative for chest pain, edema or irregular heartbeat Gastrointestinal: Negative for abdominal pain, positive for slight nausea Genito-Urinary: Negative for dysuria, hematuria - and currently under treatment for UTI Musculoskeletal: Negative for joint swelling or muscular weakness. Bruise on left elbow. Neurological: As noted in HPI Dermatological: Negative for rash or skin changes  Blood pressure (!) 116/59, pulse 67, temperature 97.6 F (36.4 C), temperature source Oral, resp. rate 16, height 5\' 3"  (1.6 m), weight 60 kg (132 lb 3.2 oz), SpO2 99 %.   Physical Examination:                                                                                                      General: WDWN female. Appears calm and comfortable in bed HEENT:  Normocephalic, no lesions, without obvious abnormality.  Normal external eye and conjunctiva. Wearing glasses. Normal external ears. Normal external nose, mucus membranes and septum.  Normal pharynx. Cardiovascular: S1, S2 normal, pulses palpable throughout   Pulmonary: chest clear, no wheezing, rales, normal symmetric air entry Abdomen: Soft,  non-tender Extremities: no joint deformities, effusion, or inflammation and bruise on left elbow Musculoskeletal: no joint tenderness, deformity or swelling Tone and bulk:normal tone throughout; no atrophy noted Skin: warm and dry, no hyperpigmentation, vitiligo, or suspicious  lesions  Neurological Examination:                                                                                               Mental Status: [name] is alert, oriented x 4, thought content appropriate.  Speech fluent without evidence of aphasia. Able to follow 3-step commands without difficulty. Cranial Nerves: II: Visual fields grossly normal, pupils are 33mm, equal, round, reactive to light to 82mm and minimally accommodation. III,IV, VI: Ptosis not present, extra-ocular muscle movements intact bilaterally V,VII: Smile and eyebrow raise is symmetric. Facial light touch sensation intact bilaterally VIII: Hearing grossly intact IX,X: Uvula and palate rise symmetrically XI: SCM and bilateral shoulder shrug strength 5/5 XII: Midline tongue extension Motor: Right :     Upper extremity   5/5   Left:     Upper extremity   5/5          Lower extremity   5/5     Lower extremity   5/5 Pronator drift not present Sensory: Light touch intact throughout, bilaterally Deep Tendon Reflexes: 2+ and symmetric throughout bilateral biceps reflex, 1+ left brachioradialis, 2+ right brachioradialis, 2+ bilateral patellar tendon, 1+ bilateral AJ. Plantars: Right: downgoing   Left: downgoing Cerebellar: Finger-to-nose test without evidence of dysmetria or ataxia.  Gait: Not tested   Lab Results: Basic Metabolic Panel:  Recent Labs Lab 10/13/16 1321 10/13/16 1337 10/13/16 1917 10/14/16 0539  NA 138 140  --  139  K 3.9 3.9  --  4.2  CL 110 109  --  115*  CO2 21*  --   --  16*  GLUCOSE 103* 102*  --  135*  BUN 14 17  --  16  CREATININE 1.43* 1.40*  --  1.18*  CALCIUM 10.3  --   --  9.7  MG  --   --  1.7  --   PHOS   --   --  1.7*  --     Liver Function Tests:  Recent Labs Lab 10/13/16 1321  AST 24  ALT 16  ALKPHOS 116  BILITOT 0.5  PROT 6.6  ALBUMIN 3.0*   No results for input(s): LIPASE, AMYLASE in the last 168 hours. No results for input(s): AMMONIA in the last 168 hours.  CBC:  Recent Labs Lab 10/13/16 1321 10/13/16 1337 10/14/16 0539  WBC 14.0*  --  11.1*  NEUTROABS 12.0*  --   --   HGB 11.0* 11.2* 10.3*  HCT 34.1* 33.0* 32.3*  MCV 90.2  --  89.5  PLT 253  --  248    Cardiac Enzymes:  Recent Labs Lab 10/13/16 1321  CKTOTAL 33*  TROPONINI <0.03    Lipid Panel: No results for input(s): CHOL, TRIG, HDL, CHOLHDL, VLDL, LDLCALC in the last 168 hours.  CBG:  Recent Labs Lab 10/14/16 0627  GLUCAP 143*    Microbiology: Results for orders placed or performed during the hospital encounter of 10/13/16  Culture, Urine     Status: Abnormal (Preliminary result)   Collection Time: 10/13/16  3:19 PM  Result Value Ref Range Status   Specimen  Description URINE, RANDOM  Final   Special Requests NONE  Final   Culture >=100,000 COLONIES/mL GRAM NEGATIVE RODS (A)  Final   Report Status PENDING  Incomplete    Coagulation Studies: No results for input(s): LABPROT, INR in the last 72 hours.  Imaging: Ct Angio Head W Or Wo Contrast  Result Date: 10/13/2016 CLINICAL DATA:  66 year old female with dizziness, posterior head and neck pain for 4 days. Syncopal episode last night. Diffuse weakness. EXAM: CT ANGIOGRAPHY HEAD AND NECK TECHNIQUE: Multidetector CT imaging of the head and neck was performed using the standard protocol during bolus administration of intravenous contrast. Multiplanar CT image reconstructions and MIPs were obtained to evaluate the vascular anatomy. Carotid stenosis measurements (when applicable) are obtained utilizing NASCET criteria, using the distal internal carotid diameter as the denominator. CONTRAST:  50 mL Isovue 370 COMPARISON:  CT cervical myelogram  08/18/2015. Outside brain MRI 04/29/2015. CT head and cervical spine 09/12/2014. Chest CT 09/04/2015. FINDINGS: CT HEAD Brain: Cerebral volume is stable since 2016 and remains normal for age. No midline shift, ventriculomegaly, mass effect, evidence of mass lesion, intracranial hemorrhage or evidence of cortically based acute infarction. Gray-white matter differentiation is within normal limits throughout the brain. Calvarium and skull base: Negative. Paranasal sinuses: Clear. Orbits: Visualized orbits and scalp soft tissues are within normal limits. CTA NECK Skeleton: Extensive prior cervical spine surgery with addition of C1-C3 level posterior fusion hardware since the 2017 CT myelogram. No acute osseous abnormality identified. Upper chest: Negative visualized superior mediastinum. Stable small calcified right upper lobe granuloma. Minor dependent atelectasis. Other neck: Negative.  No cervical lymphadenopathy. Aortic arch: 3 vessel arch configuration.  No arch atherosclerosis. Right carotid system: Mildly tortuous brachiocephalic artery with a mildly kinked appearance at the right thoracic inlet. Normal right CCA origin. Normal right CCA and right carotid bifurcation. Normal cervical right ICA aside from mild tortuosity. Left carotid system: Normal left CCA origin. Normal left CCA. Normal left carotid bifurcation. Normal cervical left ICA aside from mild tortuosity. Vertebral arteries: No proximal right subclavian atherosclerosis or stenosis. Normal right vertebral artery origin. Mildly dolichoectatic but otherwise normal cervical right vertebral artery. No proximal left subclavian atherosclerosis or stenosis. Normal left vertebral artery origin. Mildly tortuous but otherwise normal left vertebral artery to the skullbase. CTA HEAD Posterior circulation: Normal distal vertebral arteries. Normal PICA origins and vertebrobasilar junction. Basilar artery, AICA origins, SCA origins and PCA origins are patent without  stenosis. Posterior communicating arteries are diminutive or absent. Bilateral PCA branches are normal. Anterior circulation: Both ICA siphons are patent with minimal calcified plaque on the left. No siphon stenosis. Normal carotid termini. Normal MCA and ACA origins. Mildly ectatic anterior communicating artery. Bilateral ACA branches are within normal limits. Left MCA M1 segment, trifurcation, and left MCA branches are within normal limits. Right MCA M1 segment, trifurcation, and right MCA branches are within normal limits. Venous sinuses: Patent. Anatomic variants: None. Delayed phase: No abnormal enhancement identified. Review of the MIP images confirms the above findings IMPRESSION: 1. Normal arterial findings on CTA head and neck except for tortuosity of the cervical carotid and vertebral arteries. 2.  Normal for age CT appearance of the brain. 3. Extensive prior cervical spine surgery. No acute osseous abnormality identified. Electronically Signed   By: Genevie Ann M.D.   On: 10/13/2016 15:32   Ct Angio Neck W And/or Wo Contrast  Result Date: 10/13/2016 CLINICAL DATA:  66 year old female with dizziness, posterior head and neck pain for 4 days. Syncopal episode  last night. Diffuse weakness. EXAM: CT ANGIOGRAPHY HEAD AND NECK TECHNIQUE: Multidetector CT imaging of the head and neck was performed using the standard protocol during bolus administration of intravenous contrast. Multiplanar CT image reconstructions and MIPs were obtained to evaluate the vascular anatomy. Carotid stenosis measurements (when applicable) are obtained utilizing NASCET criteria, using the distal internal carotid diameter as the denominator. CONTRAST:  50 mL Isovue 370 COMPARISON:  CT cervical myelogram 08/18/2015. Outside brain MRI 04/29/2015. CT head and cervical spine 09/12/2014. Chest CT 09/04/2015. FINDINGS: CT HEAD Brain: Cerebral volume is stable since 2016 and remains normal for age. No midline shift, ventriculomegaly, mass  effect, evidence of mass lesion, intracranial hemorrhage or evidence of cortically based acute infarction. Gray-white matter differentiation is within normal limits throughout the brain. Calvarium and skull base: Negative. Paranasal sinuses: Clear. Orbits: Visualized orbits and scalp soft tissues are within normal limits. CTA NECK Skeleton: Extensive prior cervical spine surgery with addition of C1-C3 level posterior fusion hardware since the 2017 CT myelogram. No acute osseous abnormality identified. Upper chest: Negative visualized superior mediastinum. Stable small calcified right upper lobe granuloma. Minor dependent atelectasis. Other neck: Negative.  No cervical lymphadenopathy. Aortic arch: 3 vessel arch configuration.  No arch atherosclerosis. Right carotid system: Mildly tortuous brachiocephalic artery with a mildly kinked appearance at the right thoracic inlet. Normal right CCA origin. Normal right CCA and right carotid bifurcation. Normal cervical right ICA aside from mild tortuosity. Left carotid system: Normal left CCA origin. Normal left CCA. Normal left carotid bifurcation. Normal cervical left ICA aside from mild tortuosity. Vertebral arteries: No proximal right subclavian atherosclerosis or stenosis. Normal right vertebral artery origin. Mildly dolichoectatic but otherwise normal cervical right vertebral artery. No proximal left subclavian atherosclerosis or stenosis. Normal left vertebral artery origin. Mildly tortuous but otherwise normal left vertebral artery to the skullbase. CTA HEAD Posterior circulation: Normal distal vertebral arteries. Normal PICA origins and vertebrobasilar junction. Basilar artery, AICA origins, SCA origins and PCA origins are patent without stenosis. Posterior communicating arteries are diminutive or absent. Bilateral PCA branches are normal. Anterior circulation: Both ICA siphons are patent with minimal calcified plaque on the left. No siphon stenosis. Normal carotid  termini. Normal MCA and ACA origins. Mildly ectatic anterior communicating artery. Bilateral ACA branches are within normal limits. Left MCA M1 segment, trifurcation, and left MCA branches are within normal limits. Right MCA M1 segment, trifurcation, and right MCA branches are within normal limits. Venous sinuses: Patent. Anatomic variants: None. Delayed phase: No abnormal enhancement identified. Review of the MIP images confirms the above findings IMPRESSION: 1. Normal arterial findings on CTA head and neck except for tortuosity of the cervical carotid and vertebral arteries. 2.  Normal for age CT appearance of the brain. 3. Extensive prior cervical spine surgery. No acute osseous abnormality identified. Electronically Signed   By: Genevie Ann M.D.   On: 10/13/2016 15:32    History and examination documented by Solon Augusta PA-C, [facility] 10/14/2016, 3:35 PM  Assessment: 66 year old female presenting with headache, neck pain and spells of LOC with urinary incontinence. DDx includes seizure, syncope and stress-related symptoms 1. Normal arterial findings on CTA head and neck except for tortuosity of the cervical carotid and vertebral arteries.  2. Normal for age CT appearance of the brain.  3. Normal EEG. 4. Nonfocal neurological examination.  5. Headache and neck pain temporarily relieved with massage at the bedside  Recommendations: 1. Outpatient follow up with her PCP 2. Consider Neurology follow up for  home/ambulatory EEG monitoring (Digitrace) 3. Recommended outpatient massage therapy for neck and head pain 4. May need Holter monitoring or loop recorder as outpatient  Electronically signed: Dr. Kerney Elbe

## 2016-10-15 ENCOUNTER — Inpatient Hospital Stay (HOSPITAL_COMMUNITY): Payer: Medicare Other

## 2016-10-15 ENCOUNTER — Other Ambulatory Visit: Payer: Self-pay | Admitting: Emergency Medicine

## 2016-10-15 ENCOUNTER — Ambulatory Visit: Payer: Self-pay | Admitting: Gynecology

## 2016-10-15 DIAGNOSIS — R55 Syncope and collapse: Secondary | ICD-10-CM

## 2016-10-15 LAB — T4, FREE: Free T4: 1.05 ng/dL (ref 0.61–1.12)

## 2016-10-15 LAB — BASIC METABOLIC PANEL
Anion gap: 6 (ref 5–15)
BUN: 22 mg/dL — ABNORMAL HIGH (ref 6–20)
CO2: 20 mmol/L — ABNORMAL LOW (ref 22–32)
Calcium: 9.8 mg/dL (ref 8.9–10.3)
Chloride: 116 mmol/L — ABNORMAL HIGH (ref 101–111)
Creatinine, Ser: 1.34 mg/dL — ABNORMAL HIGH (ref 0.44–1.00)
GFR calc Af Amer: 47 mL/min — ABNORMAL LOW (ref >60)
GFR calc non Af Amer: 40 mL/min — ABNORMAL LOW (ref >60)
Glucose, Bld: 111 mg/dL — ABNORMAL HIGH (ref 65–99)
Potassium: 3.4 mmol/L — ABNORMAL LOW (ref 3.5–5.1)
Sodium: 142 mmol/L (ref 135–145)

## 2016-10-15 LAB — CBC
HCT: 28.3 % — ABNORMAL LOW (ref 36.0–46.0)
Hemoglobin: 8.9 g/dL — ABNORMAL LOW (ref 12.0–15.0)
MCH: 28 pg (ref 26.0–34.0)
MCHC: 31.4 g/dL (ref 30.0–36.0)
MCV: 89 fL (ref 78.0–100.0)
Platelets: 257 10*3/uL (ref 150–400)
RBC: 3.18 MIL/uL — ABNORMAL LOW (ref 3.87–5.11)
RDW: 14.6 % (ref 11.5–15.5)
WBC: 10 10*3/uL (ref 4.0–10.5)

## 2016-10-15 LAB — ECHOCARDIOGRAM COMPLETE
Height: 63 in
Weight: 2163.2 oz

## 2016-10-15 LAB — URINE CULTURE: Culture: >=100000 — AB

## 2016-10-15 LAB — TSH: TSH: 3.213 u[IU]/mL (ref 0.350–4.500)

## 2016-10-15 LAB — GLUCOSE, CAPILLARY: Glucose-Capillary: 89 mg/dL (ref 65–99)

## 2016-10-15 MED ORDER — TECHNETIUM TO 99M ALBUMIN AGGREGATED
4.0000 | Freq: Once | INTRAVENOUS | Status: AC | PRN
  Administered 2016-10-15: 4 via INTRAVENOUS

## 2016-10-15 MED ORDER — POTASSIUM CHLORIDE CRYS ER 20 MEQ PO TBCR
40.0000 meq | EXTENDED_RELEASE_TABLET | Freq: Once | ORAL | Status: AC
  Administered 2016-10-15: 40 meq via ORAL
  Filled 2016-10-15: qty 2

## 2016-10-15 MED ORDER — TECHNETIUM TC 99M DIETHYLENETRIAME-PENTAACETIC ACID
31.0000 | Freq: Once | INTRAVENOUS | Status: AC | PRN
  Administered 2016-10-15: 31 via INTRAVENOUS

## 2016-10-15 MED ORDER — CEPHALEXIN 500 MG PO CAPS
500.0000 mg | ORAL_CAPSULE | Freq: Three times a day (TID) | ORAL | Status: DC
  Administered 2016-10-15 – 2016-10-16 (×5): 500 mg via ORAL
  Filled 2016-10-15 (×5): qty 1

## 2016-10-15 NOTE — Progress Notes (Addendum)
[facility] PROGRESS NOTE  [name] EPP:[id number] DOB: Jul 29, 1950 DOA: 10/13/2016  PCP: Cassandria Anger, MD  Brief History/Interval Summary: 66 year old Caucasian female with a past medical history of asthma, gastritis, GERD, hypothyroidism, irritable bowel syndrome, chronic depression with anxiety, migraine headaches and dyslipidemia presented with 2 episodes of syncope. She had urinary incontinence associated with these episodes. She started feeling unwell about 5 days prior to hospitalization with the onset of migraine headaches. There is concern for seizure type activity. Patient was hospitalized for further management. She was also found to have a urinary tract infection.  Reason for Visit: Syncope  Consultants: Neurology. Cardiology  Procedures:   EEG Impression: This awake EEG is normal.    Transthoracic echocardiogram is pending  Antibiotics: Ceftriaxone will be changed over to Keflex today.  Subjective/Interval History: Patient states that she's not feeling well. Continues to have headache and neck pain. Some chest tightness and occasional difficulty breathing.  ROS: Some nausea but no vomiting  Objective:  Vital Signs  Vitals:   10/14/16 1142 10/14/16 1553 10/14/16 2044 10/15/16 0700  BP: (!) 116/59  134/65 134/74  Pulse: 67  71 69  Resp:   18 18  Temp: 97.6 F (36.4 C)  97.8 F (36.6 C) 98.5 F (36.9 C)  TempSrc: Oral  Oral Oral  SpO2: 99% 95% 100% 97%  Weight:    61.3 kg (135 lb 3.2 oz)  Height:        Intake/Output Summary (Last 24 hours) at 10/15/16 0832 Last data filed at 10/14/16 2125  Gross per 24 hour  Intake           1235.5 ml  Output                0 ml  Net           1235.5 ml   Filed Weights   10/13/16 1820 10/14/16 0547 10/15/16 0700  Weight: 62.3 kg (137 lb 4.8 oz) 60 kg (132 lb 3.2 oz) 61.3 kg (135 lb 3.2 oz)   Two Brief episodes of SVT noted.  General appearance: Awake, alert. In no distress Resp: Clear  to auscultation bilaterally Cardio: S1, S2 is normal, regular. No S3, S4. No rubs, murmurs, or bruit GI: Abdomen is soft. Nontender, nondistended. Bowel sounds are present. No masses or organomegaly Extremities: No pedal edema Neurologic: Awake and alert. No focal neurological deficits  Lab Results:  Data Reviewed: I have personally reviewed following labs and imaging studies  CBC:  Recent Labs Lab 10/13/16 1321 10/13/16 1337 10/14/16 0539 10/15/16 0339  WBC 14.0*  --  11.1* 10.0  NEUTROABS 12.0*  --   --   --   HGB 11.0* 11.2* 10.3* 8.9*  HCT 34.1* 33.0* 32.3* 28.3*  MCV 90.2  --  89.5 89.0  PLT 253  --  248 606    Basic Metabolic Panel:  Recent Labs Lab 10/13/16 1321 10/13/16 1337 10/13/16 1917 10/14/16 0539 10/15/16 0339  NA 138 140  --  139 142  K 3.9 3.9  --  4.2 3.4*  CL 110 109  --  115* 116*  CO2 21*  --   --  16* 20*  GLUCOSE 103* 102*  --  135* 111*  BUN 14 17  --  16 22*  CREATININE 1.43* 1.40*  --  1.18* 1.34*  CALCIUM 10.3  --   --  9.7 9.8  MG  --   --  1.7  --   --  PHOS  --   --  1.7*  --   --     GFR: Estimated Creatinine Clearance: 34.2 mL/min (A) (by C-G formula based on SCr of 1.34 mg/dL (H)).  Liver Function Tests:  Recent Labs Lab 10/13/16 1321  AST 24  ALT 16  ALKPHOS 116  BILITOT 0.5  PROT 6.6  ALBUMIN 3.0*    Cardiac Enzymes:  Recent Labs Lab 10/13/16 1321  CKTOTAL 33*  TROPONINI <0.03    CBG:  Recent Labs Lab 10/14/16 0627 10/15/16 0630  GLUCAP 143* 89      Radiology Studies: Ct Angio Head W Or Wo Contrast  Result Date: 10/13/2016 CLINICAL DATA:  66 year old female with dizziness, posterior head and neck pain for 4 days. Syncopal episode last night. Diffuse weakness. EXAM: CT ANGIOGRAPHY HEAD AND NECK TECHNIQUE: Multidetector CT imaging of the head and neck was performed using the standard protocol during bolus administration of intravenous contrast. Multiplanar CT image reconstructions and MIPs were  obtained to evaluate the vascular anatomy. Carotid stenosis measurements (when applicable) are obtained utilizing NASCET criteria, using the distal internal carotid diameter as the denominator. CONTRAST:  50 mL Isovue 370 COMPARISON:  CT cervical myelogram 08/18/2015. Outside brain MRI 04/29/2015. CT head and cervical spine 09/12/2014. Chest CT 09/04/2015. FINDINGS: CT HEAD Brain: Cerebral volume is stable since 2016 and remains normal for age. No midline shift, ventriculomegaly, mass effect, evidence of mass lesion, intracranial hemorrhage or evidence of cortically based acute infarction. Gray-white matter differentiation is within normal limits throughout the brain. Calvarium and skull base: Negative. Paranasal sinuses: Clear. Orbits: Visualized orbits and scalp soft tissues are within normal limits. CTA NECK Skeleton: Extensive prior cervical spine surgery with addition of C1-C3 level posterior fusion hardware since the 2017 CT myelogram. No acute osseous abnormality identified. Upper chest: Negative visualized superior mediastinum. Stable small calcified right upper lobe granuloma. Minor dependent atelectasis. Other neck: Negative.  No cervical lymphadenopathy. Aortic arch: 3 vessel arch configuration.  No arch atherosclerosis. Right carotid system: Mildly tortuous brachiocephalic artery with a mildly kinked appearance at the right thoracic inlet. Normal right CCA origin. Normal right CCA and right carotid bifurcation. Normal cervical right ICA aside from mild tortuosity. Left carotid system: Normal left CCA origin. Normal left CCA. Normal left carotid bifurcation. Normal cervical left ICA aside from mild tortuosity. Vertebral arteries: No proximal right subclavian atherosclerosis or stenosis. Normal right vertebral artery origin. Mildly dolichoectatic but otherwise normal cervical right vertebral artery. No proximal left subclavian atherosclerosis or stenosis. Normal left vertebral artery origin. Mildly  tortuous but otherwise normal left vertebral artery to the skullbase. CTA HEAD Posterior circulation: Normal distal vertebral arteries. Normal PICA origins and vertebrobasilar junction. Basilar artery, AICA origins, SCA origins and PCA origins are patent without stenosis. Posterior communicating arteries are diminutive or absent. Bilateral PCA branches are normal. Anterior circulation: Both ICA siphons are patent with minimal calcified plaque on the left. No siphon stenosis. Normal carotid termini. Normal MCA and ACA origins. Mildly ectatic anterior communicating artery. Bilateral ACA branches are within normal limits. Left MCA M1 segment, trifurcation, and left MCA branches are within normal limits. Right MCA M1 segment, trifurcation, and right MCA branches are within normal limits. Venous sinuses: Patent. Anatomic variants: None. Delayed phase: No abnormal enhancement identified. Review of the MIP images confirms the above findings IMPRESSION: 1. Normal arterial findings on CTA head and neck except for tortuosity of the cervical carotid and vertebral arteries. 2.  Normal for age CT appearance of the brain. 3.  Extensive prior cervical spine surgery. No acute osseous abnormality identified. Electronically Signed   By: Genevie Ann M.D.   On: 10/13/2016 15:32   Ct Angio Neck W And/or Wo Contrast  Result Date: 10/13/2016 CLINICAL DATA:  66 year old female with dizziness, posterior head and neck pain for 4 days. Syncopal episode last night. Diffuse weakness. EXAM: CT ANGIOGRAPHY HEAD AND NECK TECHNIQUE: Multidetector CT imaging of the head and neck was performed using the standard protocol during bolus administration of intravenous contrast. Multiplanar CT image reconstructions and MIPs were obtained to evaluate the vascular anatomy. Carotid stenosis measurements (when applicable) are obtained utilizing NASCET criteria, using the distal internal carotid diameter as the denominator. CONTRAST:  50 mL Isovue 370  COMPARISON:  CT cervical myelogram 08/18/2015. Outside brain MRI 04/29/2015. CT head and cervical spine 09/12/2014. Chest CT 09/04/2015. FINDINGS: CT HEAD Brain: Cerebral volume is stable since 2016 and remains normal for age. No midline shift, ventriculomegaly, mass effect, evidence of mass lesion, intracranial hemorrhage or evidence of cortically based acute infarction. Gray-white matter differentiation is within normal limits throughout the brain. Calvarium and skull base: Negative. Paranasal sinuses: Clear. Orbits: Visualized orbits and scalp soft tissues are within normal limits. CTA NECK Skeleton: Extensive prior cervical spine surgery with addition of C1-C3 level posterior fusion hardware since the 2017 CT myelogram. No acute osseous abnormality identified. Upper chest: Negative visualized superior mediastinum. Stable small calcified right upper lobe granuloma. Minor dependent atelectasis. Other neck: Negative.  No cervical lymphadenopathy. Aortic arch: 3 vessel arch configuration.  No arch atherosclerosis. Right carotid system: Mildly tortuous brachiocephalic artery with a mildly kinked appearance at the right thoracic inlet. Normal right CCA origin. Normal right CCA and right carotid bifurcation. Normal cervical right ICA aside from mild tortuosity. Left carotid system: Normal left CCA origin. Normal left CCA. Normal left carotid bifurcation. Normal cervical left ICA aside from mild tortuosity. Vertebral arteries: No proximal right subclavian atherosclerosis or stenosis. Normal right vertebral artery origin. Mildly dolichoectatic but otherwise normal cervical right vertebral artery. No proximal left subclavian atherosclerosis or stenosis. Normal left vertebral artery origin. Mildly tortuous but otherwise normal left vertebral artery to the skullbase. CTA HEAD Posterior circulation: Normal distal vertebral arteries. Normal PICA origins and vertebrobasilar junction. Basilar artery, AICA origins, SCA origins  and PCA origins are patent without stenosis. Posterior communicating arteries are diminutive or absent. Bilateral PCA branches are normal. Anterior circulation: Both ICA siphons are patent with minimal calcified plaque on the left. No siphon stenosis. Normal carotid termini. Normal MCA and ACA origins. Mildly ectatic anterior communicating artery. Bilateral ACA branches are within normal limits. Left MCA M1 segment, trifurcation, and left MCA branches are within normal limits. Right MCA M1 segment, trifurcation, and right MCA branches are within normal limits. Venous sinuses: Patent. Anatomic variants: None. Delayed phase: No abnormal enhancement identified. Review of the MIP images confirms the above findings IMPRESSION: 1. Normal arterial findings on CTA head and neck except for tortuosity of the cervical carotid and vertebral arteries. 2.  Normal for age CT appearance of the brain. 3. Extensive prior cervical spine surgery. No acute osseous abnormality identified. Electronically Signed   By: Genevie Ann M.D.   On: 10/13/2016 15:32     Medications:  Scheduled: . busPIRone  10 mg Oral BID  . enoxaparin (LOVENOX) injection  40 mg Subcutaneous Q24H  . estradiol  1 mg Oral Daily  . FLUoxetine  40 mg Oral Daily  . fluticasone  1 spray Each Nare BID  . gabapentin  100 mg Oral TID  . levothyroxine  100 mcg Oral QAC breakfast  . mirtazapine  30 mg Oral QHS  . pantoprazole  40 mg Oral BID  . potassium chloride  40 mEq Oral Once  . sodium chloride flush  3 mL Intravenous Q12H  . topiramate  50 mg Oral QHS   Continuous: . sodium chloride 10 mL/hr at 10/14/16 1713  . cefTRIAXone (ROCEPHIN)  IV Stopped (10/14/16 1730)   PJA:[id number] **OR** acetaminophen, ALPRAZolam, baclofen, ketorolac, ondansetron **OR** ondansetron (ZOFRAN) IV, traMADol  Assessment/Plan:  Principal Problem:   Syncope Active Problems:   Migraine headache   Asthma   Anxiety   Hyperlipidemia   UTI (urinary tract infection)    CKD (chronic kidney disease) stage 3, GFR 30-59 ml/min   Chronic depression   Hypothyroidism    Syncope Patient had 2 episodes of syncope at home, which were unwitnessed and associated with bladder incontinence. Initial concern was for seizure activity. EEG did not show any epileptiform activity. Patient seen by neurology. Patient complaining of dyspnea and chest tightness this morning. Telemetry shows 2 brief episodes of his feet. EKG was repeated and does not show any acute changes. Proceed with chest x-ray and VQ scan. Patient requesting input from cardiology. This may be reasonable to obtain at this time. Echocardiogram is pending. Orthostatics were unremarkable.   Migraine headaches Ongoing for about 5 days. Followed by neurology as outpatient. She has been taking her home medications including Topamax, tramadol Neurontin and Imitrex without much relief. Patient was given dexamethasone in the ED. CT scan did not show any acute neurological process. Neurology recommends outpatient follow-up with her neurologist for home/ambulatory EEG. Outpatient massage therapy also recommended.  Urinary tract infection with Klebsiella Patient was initially started on ceftriaxone. Sensitivities reviewed. Change over to Keflex.   History of asthma. Stable  History of chronic kidney disease stage III Renal function is stable at baseline. Mild non-anion gap metabolic acidosis was noted and is improving. Continue to monitor.  History of hypothyroidism.  Continue Synthroid. Due to presence of brief SVT on telemetry, check TSH and free T4.  Normocytic anemia. Drop in hemoglobin noted. Could be dilutional. No evidence for overt bleeding. Check anemia panel.  Hypokalemia. Will be repleted  DVT Prophylaxis: Lovenox    Code Status: Full code  Family Communication: Discussed with the patient  Disposition Plan: Management as outlined above.     LOS: 1 day   [facility] Pager  [phone] 10/15/2016, 8:32 AM  If 7PM-7AM, please contact night-coverage at [email or web address], password [facility]

## 2016-10-15 NOTE — Progress Notes (Signed)
  Echocardiogram 2D Echocardiogram has been performed.  [name] 10/15/2016, 9:48 AM

## 2016-10-15 NOTE — Consult Note (Signed)
Cardiology Consultation:   Patient ID: Antonia Jicha; [id number]; Apr 28, 1951   Admit date: 10/13/2016 Date of Consult: 10/15/2016  Primary Care Provider: Cassandria Anger, MD Primary Cardiologist: None Primary Electrophysiologist:  None   Patient Profile:   [name] Luetta Nutting is a 66 y.o. female with a hx of anxiety, asthma, bipolar 1 disorder, fibromyalgia, depression, GERD, IBS, gastritis, headaches, parathyroid adenoma s/p chemotherapy who is being seen today for the evaluation of syncope/brief episode of SVT at the request of Dr. Maryland Pink.  History of Present Illness:   [name] Luetta Nutting has multiple past medical problems and in the past.  Week has had two separate syncopal episodes both lasting < 10 minutes per patient and associated with urine incontinence. She denied any preceding CP, SOB, dizziness or palpitations to ER and Medicine physicians. To me she endorses feeling palpitations and chest pressure before her syncopal episode. She had been taking Xanax and Tramadol for her headaches that she has been having. 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 The ER admitted for syncope work-up.   CT angio head and neck revealed no atherosclerosis, mild tortuous brachiocephalic artery with a mildly kinked appearence at the right thoracic inlet and extensive prior cervical spine surgery otherwise normal.   Magnesium normal, potassium 3.4, creatinine 1.34, hemoglobin 8.9, 10.3 (5/31), 11 ( 5/30). Unremarkable EEG/neuro consult, normal ECHO today Thyroid panel  and  VQ scan pending + continues to have headaches, + UTI on Keflex  The patient has specifically requested cardiology consult after having 2 brief episodes of SVT. She has not experienced any further syncope, dyspnea or chest tightness since admission.  Past Medical History:  Diagnosis Date  . Allergic rhinitis, cause unspecified   . Anxiety   . Asthma    chronic  cough  . Bipolar I disorder, most recent episode (or current) depressed, severe, without mention of psychotic behavior   . Blood transfusion without reported diagnosis   . Deep phlebothrombosis, postpartum, unspecified as to episode of care(671.40)   . Depression   . Fibromyalgia   . Gastritis   . GERD (gastroesophageal reflux disease)   . Hyperlipidemia   . IBS (irritable bowel syndrome)   . 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 Migraine headache   . Other and unspecified hyperlipidemia   . Parathyroid adenoma 2014  . Status post chemotherapy 2014    Past Surgical History:  Procedure Laterality Date  . CESAREAN SECTION  1986  . CHOLECYSTECTOMY N/A 05/03/2013   Procedure: LAPAROSCOPIC CHOLECYSTECTOMY WITH INTRAOPERATIVE CHOLANGIOGRAM;  Surgeon: Odis Hollingshead, MD;  Location: [facility];  Service: General;  Laterality: N/A;  . COLONOSCOPY    . laprotomy  04/2012   for cancer  . NECK SURGERY  2006   C spine fusion  . PUBOVAGINAL SLING    . VAGINAL HYSTERECTOMY       Inpatient Medications: Scheduled Meds: . busPIRone  10 mg Oral BID  . cephALEXin  500 mg Oral Q8H  . enoxaparin (LOVENOX) injection  40 mg Subcutaneous Q24H  . estradiol  1 mg Oral Daily  . FLUoxetine  40 mg Oral Daily  . fluticasone  1 spray Each Nare BID  . gabapentin  100 mg Oral TID  . levothyroxine  100 mcg Oral QAC breakfast  . mirtazapine  30 mg Oral QHS  . pantoprazole  40 mg Oral BID  . sodium chloride flush  3 mL Intravenous Q12H  . topiramate  50 mg Oral QHS   Continuous Infusions: . sodium chloride 10 mL/hr at 10/14/16 1713   PRN Meds: acetaminophen **OR** acetaminophen, ALPRAZolam, baclofen, ketorolac, ondansetron **OR** ondansetron (ZOFRAN) IV, traMADol  Allergies:    Allergies  Allergen Reactions  . Buprenorphine Hcl Other (See Comments)    Severe headache, confusion  . Morphine And Related Other (See Comments)    Severe headache  . Codeine Other (See Comments)     Disoriented  . Dust Mite Extract Cough  . Molds & Smuts Cough    Social History:   Social History   Social History  . Marital status: Divorced    Spouse name: N/A  . Number of children: 2  . Years of education: 74   Occupational History  . 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 Smoking status: Never Smoker  . Smokeless tobacco: Never Used  . Alcohol use 1.2 - 1.8 oz/week    2 - 3 Standard drinks or equivalent per week     Comment: rarely  . Drug use: No  . Sexual activity: Not Currently    Partners: Male   Other Topics Concern  . Not on file   Social History Narrative   Married 4 years- divorced; married 10 years- divorced. Serially monogamous relationships. Remarried March '11. 2 sons- '82, '87.    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 (June "12).    Lives in her own home.  Currently going through a divorce.    Family History:   The patient's family history includes Arthritis in her mother; Coronary artery disease in her father; Diabetes in her maternal grandmother; Heart disease in her father and mother; Hypertension in her father and mother; Irritable bowel syndrome in her brother, father, and sister; Migraines in her mother. There is no history of Colon cancer, Esophageal cancer, Rectal cancer, or Stomach cancer.  ROS:  Please see the history of present illness.  All other ROS reviewed and negative.     Physical Exam/Data:   Vitals:   10/14/16 2044 10/15/16 0700 10/15/16 1012 10/15/16 1304  BP: 134/65 134/74 117/64 (!) 149/79  Pulse: 71 69 68 71  Resp: 18 18  16   Temp: 97.8 F (36.6 C) 98.5 F (36.9 C)  98.4 F (36.9 C)  TempSrc: Oral Oral  Oral  SpO2: 100% 97%  96%  Weight:  135 lb 3.2 oz (61.3 kg)    Height:        Intake/Output Summary (Last 24 hours) at 10/15/16 1450 Last data filed at 10/15/16 1431  Gross per 24 hour  Intake           1585.5 ml   Output              600 ml  Net            985.5 ml   Filed Weights   10/13/16 1820 10/14/16 0547 10/15/16 0700  Weight: 137 lb 4.8 oz (62.3 kg) 132 lb 3.2 oz (60 kg) 135 lb 3.2 oz (61.3 kg)   Body mass index is 23.95 kg/m.  General: Well developed, well nourished, in no acute distress. +holding ice pack on forehead Head: Normocephalic, atraumatic, sclera non-icteric, no xanthomas, nares are without discharge.  Neck: Negative for carotid bruits. JVD not elevated. Lungs: Clear bilaterally to auscultation without wheezes, rales, or rhonchi. Breathing is unlabored. Heart:  RRR with S1 S2. No murmurs, rubs, or gallops appreciated. Abdomen: Soft, non-tender, non-distended with normoactive bowel sounds. No hepatomegaly. No rebound/guarding. No obvious abdominal masses. Msk:  Strength and tone appear normal for age. Extremities: No clubbing or cyanosis. No edema.  Distal pedal pulses are 2+ and equal bilaterally. Neuro: Alert and oriented X 3. No facial asymmetry. No focal deficit. Moves all extremities spontaneously. Psych:  Responds to questions appropriately with a normal affect.  EKG:  The EKG was personally reviewed and demonstrates HR 70s, NSR  Relevant CV Studies: Transthoracic echocardiogram 10/15/2016  Study Conclusions  - Left ventricle: The cavity size was normal. Wall thickness was   normal. Systolic function was normal. The estimated ejection   fraction was in the range of 60% to 65%. Wall motion was normal;   there were no regional wall motion abnormalities. Left   ventricular diastolic function parameters were normal. - Aortic valve: There was no stenosis. - Mitral valve: There was trivial regurgitation. - Right ventricle: The cavity size was normal. Systolic function   was normal. - Tricuspid valve: Peak RV-RA gradient (S): 24 mm Hg. - Pulmonary arteries: PA peak pressure: 27 mm Hg (S). - Inferior vena cava: The vessel was normal in size. The   respirophasic diameter  changes were in the normal range (= 50%),   consistent with normal central venous pressure.  Impressions:  - Normal study.   Laboratory Data:  Chemistry  Recent Labs Lab 10/13/16 1321 10/13/16 1337 10/14/16 0539 10/15/16 0339  NA 138 140 139 142  K 3.9 3.9 4.2 3.4*  CL 110 109 115* 116*  CO2 21*  --  16* 20*  GLUCOSE 103* 102* 135* 111*  BUN 14 17 16  22*  CREATININE 1.43* 1.40* 1.18* 1.34*  CALCIUM 10.3  --  9.7 9.8  GFRNONAA 37*  --  47* 40*  GFRAA 43*  --  54* 47*  ANIONGAP 7  --  8 6     Recent Labs Lab 10/13/16 1321  PROT 6.6  ALBUMIN 3.0*  AST 24  ALT 16  ALKPHOS 116  BILITOT 0.5   Hematology  Recent Labs Lab 10/13/16 1321 10/13/16 1337 10/14/16 0539 10/15/16 0339  WBC 14.0*  --  11.1* 10.0  RBC 3.78*  --  3.61* 3.18*  HGB 11.0* 11.2* 10.3* 8.9*  HCT 34.1* 33.0* 32.3* 28.3*  MCV 90.2  --  89.5 89.0  MCH 29.1  --  28.5 28.0  MCHC 32.3  --  31.9 31.4  RDW 15.0  --  14.7 14.6  PLT 253  --  248 257   Cardiac Enzymes  Recent Labs Lab 10/13/16 1321  TROPONINI <0.03   No results for input(s): TROPIPOC in the last 168 hours.  BNPNo results for input(s): BNP, PROBNP in the last 168 hours.  DDimer No results for input(s): DDIMER in the last 168 hours.  Radiology/Studies:  Ct Angio Head W Or Wo Contrast  Result Date: 10/13/2016 CLINICAL DATA:  66 year old female with dizziness, posterior head and neck pain for 4 days. Syncopal episode last night. Diffuse weakness. EXAM: CT ANGIOGRAPHY HEAD AND NECK TECHNIQUE: Multidetector CT imaging of the head and neck was performed using the standard protocol during bolus administration of intravenous contrast. Multiplanar CT image reconstructions and MIPs were obtained to evaluate the vascular anatomy. Carotid stenosis measurements (when applicable) are obtained utilizing NASCET criteria, using the distal internal carotid diameter as the denominator. CONTRAST:  50 mL Isovue 370 COMPARISON:  CT cervical  myelogram  08/18/2015. Outside brain MRI 04/29/2015. CT head and cervical spine 09/12/2014. Chest CT 09/04/2015. FINDINGS: CT HEAD Brain: Cerebral volume is stable since 2016 and remains normal for age. No midline shift, ventriculomegaly, mass effect, evidence of mass lesion, intracranial hemorrhage or evidence of cortically based acute infarction. Gray-white matter differentiation is within normal limits throughout the brain. Calvarium and skull base: Negative. Paranasal sinuses: Clear. Orbits: Visualized orbits and scalp soft tissues are within normal limits. CTA NECK Skeleton: Extensive prior cervical spine surgery with addition of C1-C3 level posterior fusion hardware since the 2017 CT myelogram. No acute osseous abnormality identified. Upper chest: Negative visualized superior mediastinum. Stable small calcified right upper lobe granuloma. Minor dependent atelectasis. Other neck: Negative.  No cervical lymphadenopathy. Aortic arch: 3 vessel arch configuration.  No arch atherosclerosis. Right carotid system: Mildly tortuous brachiocephalic artery with a mildly kinked appearance at the right thoracic inlet. Normal right CCA origin. Normal right CCA and right carotid bifurcation. Normal cervical right ICA aside from mild tortuosity. Left carotid system: Normal left CCA origin. Normal left CCA. Normal left carotid bifurcation. Normal cervical left ICA aside from mild tortuosity. Vertebral arteries: No proximal right subclavian atherosclerosis or stenosis. Normal right vertebral artery origin. Mildly dolichoectatic but otherwise normal cervical right vertebral artery. No proximal left subclavian atherosclerosis or stenosis. Normal left vertebral artery origin. Mildly tortuous but otherwise normal left vertebral artery to the skullbase. CTA HEAD Posterior circulation: Normal distal vertebral arteries. Normal PICA origins and vertebrobasilar junction. Basilar artery, AICA origins, SCA origins and PCA origins are patent  without stenosis. Posterior communicating arteries are diminutive or absent. Bilateral PCA branches are normal. Anterior circulation: Both ICA siphons are patent with minimal calcified plaque on the left. No siphon stenosis. Normal carotid termini. Normal MCA and ACA origins. Mildly ectatic anterior communicating artery. Bilateral ACA branches are within normal limits. Left MCA M1 segment, trifurcation, and left MCA branches are within normal limits. Right MCA M1 segment, trifurcation, and right MCA branches are within normal limits. Venous sinuses: Patent. Anatomic variants: None. Delayed phase: No abnormal enhancement identified. Review of the MIP images confirms the above findings IMPRESSION: 1. Normal arterial findings on CTA head and neck except for tortuosity of the cervical carotid and vertebral arteries. 2.  Normal for age CT appearance of the brain. 3. Extensive prior cervical spine surgery. No acute osseous abnormality identified. Electronically Signed   By: Genevie Ann M.D.   On: 10/13/2016 15:32   Ct Angio Neck W And/or Wo Contrast  Result Date: 10/13/2016 CLINICAL DATA:  66 year old female with dizziness, posterior head and neck pain for 4 days. Syncopal episode last night. Diffuse weakness. EXAM: CT ANGIOGRAPHY HEAD AND NECK TECHNIQUE: Multidetector CT imaging of the head and neck was performed using the standard protocol during bolus administration of intravenous contrast. Multiplanar CT image reconstructions and MIPs were obtained to evaluate the vascular anatomy. Carotid stenosis measurements (when applicable) are obtained utilizing NASCET criteria, using the distal internal carotid diameter as the denominator. CONTRAST:  50 mL Isovue 370 COMPARISON:  CT cervical myelogram 08/18/2015. Outside brain MRI 04/29/2015. CT head and cervical spine 09/12/2014. Chest CT 09/04/2015. FINDINGS: CT HEAD Brain: Cerebral volume is stable since 2016 and remains normal for age. No midline shift, ventriculomegaly,  mass effect, evidence of mass lesion, intracranial hemorrhage or evidence of cortically based acute infarction. Gray-white matter differentiation is within normal limits throughout the brain. Calvarium and skull base: Negative. Paranasal sinuses: Clear. Orbits: Visualized orbits and scalp soft tissues are within normal  limits. CTA NECK Skeleton: Extensive prior cervical spine surgery with addition of C1-C3 level posterior fusion hardware since the 2017 CT myelogram. No acute osseous abnormality identified. Upper chest: Negative visualized superior mediastinum. Stable small calcified right upper lobe granuloma. Minor dependent atelectasis. Other neck: Negative.  No cervical lymphadenopathy. Aortic arch: 3 vessel arch configuration.  No arch atherosclerosis. Right carotid system: Mildly tortuous brachiocephalic artery with a mildly kinked appearance at the right thoracic inlet. Normal right CCA origin. Normal right CCA and right carotid bifurcation. Normal cervical right ICA aside from mild tortuosity. Left carotid system: Normal left CCA origin. Normal left CCA. Normal left carotid bifurcation. Normal cervical left ICA aside from mild tortuosity. Vertebral arteries: No proximal right subclavian atherosclerosis or stenosis. Normal right vertebral artery origin. Mildly dolichoectatic but otherwise normal cervical right vertebral artery. No proximal left subclavian atherosclerosis or stenosis. Normal left vertebral artery origin. Mildly tortuous but otherwise normal left vertebral artery to the skullbase. CTA HEAD Posterior circulation: Normal distal vertebral arteries. Normal PICA origins and vertebrobasilar junction. Basilar artery, AICA origins, SCA origins and PCA origins are patent without stenosis. Posterior communicating arteries are diminutive or absent. Bilateral PCA branches are normal. Anterior circulation: Both ICA siphons are patent with minimal calcified plaque on the left. No siphon stenosis. Normal  carotid termini. Normal MCA and ACA origins. Mildly ectatic anterior communicating artery. Bilateral ACA branches are within normal limits. Left MCA M1 segment, trifurcation, and left MCA branches are within normal limits. Right MCA M1 segment, trifurcation, and right MCA branches are within normal limits. Venous sinuses: Patent. Anatomic variants: None. Delayed phase: No abnormal enhancement identified. Review of the MIP images confirms the above findings IMPRESSION: 1. Normal arterial findings on CTA head and neck except for tortuosity of the cervical carotid and vertebral arteries. 2.  Normal for age CT appearance of the brain. 3. Extensive prior cervical spine surgery. No acute osseous abnormality identified. Electronically Signed   By: Genevie Ann M.D.   On: 10/13/2016 15:32    Assessment and Plan:   1. Two brief episodes of SVT: Thyroid panel pending. Pt was having symptoms of dyspnea and CP this morning correlating with episodes.  I was unable to find this episode on the telemetry, the patient was asymptomatic.   2. Syncope:  Unclear etiology of the syncope. Recommend 30 day cardiac monitor as outpatient. unable to find SVT on cardiac monitor.  Migraines: Neuro and medicine managing UTI: On Keflex Asthma:stable CKD III: baseline renal function this admission, 1.34 Hypokalemia: Medicine replacing Hypothyroidsim: Thyroid panel pending Anemia: Medicine to manage, feel it is dilutional  Signed, Linus Mako, PA-C  10/15/2016 2:50 PM  Attending Note:   The patient was seen and examined.  Agree with assessment and plan as noted above.  Changes made to the above note as needed.  Patient seen and independently examined with Delos Haring, PA .   We discussed all aspects of the encounter. I agree with the assessment and plan as stated above.  1. Syncope:   Difficult to sort out.  Was on the floor for several minutes - unable to move.  Echo is unremarkable.   No arrhythmias on tele  I  think our best option it to place a 30 day monitor and look for significant arrhytmias.   No further cardiac evaluation needed at this point  Will sign off Call for questions    I have spent a total of 40 minutes with patient reviewing hospital  notes , telemetry, EKGs,  labs and examining patient as well as establishing an assessment and plan that was discussed with the patient. > 50% of time was spent in direct patient care.    Thayer Headings, Brooke Bonito., MD, [facility] 10/15/2016, 3:48 PM [address],  [place] Pager [phone]

## 2016-10-15 NOTE — Progress Notes (Signed)
Pt educated about safety and importance of bed alarm during the night however pt refuses to be on bed alarm. Will continue to round on patient.   Kypton Eltringham, RN    

## 2016-10-15 NOTE — Progress Notes (Signed)
Patient in Nuclear Medicine for pulmonary ventilation test.

## 2016-10-15 NOTE — Progress Notes (Signed)
OT Cancellation Note  Patient Details Name: [name] MRN: [id number] DOB: 12-Feb-1951   Cancelled Treatment:    Reason Eval/Treat Not Completed: Other (comment).  VQ scan pending. Will follow up after this.  Quincy Prisco 10/15/2016, 12:46 PM  Lesle Chris, OTR/L [phone] 10/15/2016

## 2016-10-15 NOTE — Evaluation (Signed)
Physical Therapy Evaluation Patient Details Name: [name] MRN: [id number] DOB: Sep 24, 1950 Today's Date: 10/15/2016   History of Present Illness  66 yo female with onset of syncopal episode with pt reported seizure like symptoms, UTI and migraine x 5 days.  Pt has PMHx:  cervical fusion, asthma, gastritis, incontinence, depression, bipolar disorder and CKD.    Clinical Impression  Pt is demonstrating some minor changes of gait, and with her recent suspicions that she had a seizure could benefit from PT to progress mobility safely.  Her plan is to follow up with outpatient for her neck as she has been having symptoms for years with it, and is more appropriately seen inpt for mobility and strengthening of LE's.    Follow Up Recommendations Outpatient PT;Supervision - Intermittent    Equipment Recommendations  None recommended by PT    Recommendations for Other Services       Precautions / Restrictions Precautions Precautions: Fall (telemetry) Restrictions Weight Bearing Restrictions: No      Mobility  Bed Mobility Overal bed mobility: Needs Assistance Bed Mobility: Supine to Sit;Sit to Supine     Supine to sit: Min guard Sit to supine: Min guard   General bed mobility comments: min guard for safety and due to stiffness in neck that keeps pt from turning her head  Transfers Overall transfer level: Modified independent Equipment used: None             General transfer comment: using hands on the bed to stand  Ambulation/Gait Ambulation/Gait assistance: Min guard Ambulation Distance (Feet): 300 Feet Assistive device: None Gait Pattern/deviations: Step-through pattern;Narrow base of support;Trunk flexed;Decreased stride length Gait velocity: reduced Gait velocity interpretation: Below normal speed for age/gender General Gait Details: progressed on hallway with gait belt, no signs of loss of balance but took her time  Network engineer [place] (Stroke Patients Only)       Balance Overall balance assessment: No apparent balance deficits (not formally assessed)                                           Pertinent Vitals/Pain Pain Assessment: 0-10 Pain Score: 8  Pain Location: neck with gait Pain Descriptors / Indicators: Tightness Pain Intervention(s): Monitored during session;Premedicated before session;Repositioned;Heat applied (nursing agreed to heating pack)    [place] expects to be discharged to:: Private residence Living Arrangements: Alone           Home Layout: One level [place]: None      Prior Function Level of Independence: Independent               Hand Dominance   Dominant Hand: Right    Extremity/Trunk Assessment   Upper Extremity Assessment Upper Extremity Assessment: Overall WFL for tasks assessed    Lower Extremity Assessment Lower Extremity Assessment: Overall WFL for tasks assessed    Cervical / Trunk Assessment Cervical / Trunk Assessment: Kyphotic  Communication   Communication: No difficulties  Cognition Arousal/Alertness: Awake/alert Behavior During Therapy: Anxious Overall Cognitive Status: Within Functional Limits for tasks assessed  General Comments      Exercises     Assessment/Plan    PT Assessment Patient needs continued PT services  PT Problem List Decreased range of motion;Decreased strength;Decreased activity tolerance;Decreased balance;Decreased mobility;Decreased safety awareness;Pain       PT Treatment Interventions DME instruction;Gait training;Functional mobility training;Therapeutic activities;Therapeutic exercise;Balance training;Neuromuscular re-education;Patient/family education    PT Goals (Current goals can be found in the Care Plan section)  Acute Rehab PT Goals Patient Stated Goal: To get rid of HA PT Goal  Formulation: With patient Time For Goal Achievement: 10/29/16 Potential to Achieve Goals: Good    Frequency Min 3X/week   Barriers to discharge Decreased caregiver support home alone but does have friends    Co-evaluation               AM-PAC PT "6 Clicks" Daily Activity  Outcome Measure Difficulty turning over in bed (including adjusting bedclothes, sheets and blankets)?: A Little Difficulty moving from lying on back to sitting on the side of the bed? : A Little Difficulty sitting down on and standing up from a chair with arms (e.g., wheelchair, bedside commode, etc,.)?: A Little Help needed moving to and from a bed to chair (including a wheelchair)?: A Little Help needed walking in hospital room?: A Little Help needed climbing 3-5 steps with a railing? : A Little 6 Click Score: 18    End of Session Equipment Utilized During Treatment: Gait belt Activity Tolerance: Patient limited by fatigue;Patient limited by pain (reports increased HA with gait, no changes in O2 sat or puls) Patient left: in bed;with call bell/phone within reach;with nursing/sitter in room (getting pt ready for echocardiogram) Nurse Communication: Mobility status PT Visit Diagnosis: Unsteadiness on feet (R26.81);Muscle weakness (generalized) (M62.81)    Time: 4097-3532 PT Time Calculation (min) (ACUTE ONLY): 24 min   Charges:   PT Evaluation $PT Eval Moderate Complexity: 1 Procedure PT Treatments $Gait Training: 8-22 mins   PT G Codes:   PT G-Codes **NOT FOR INPATIENT CLASS** Functional Assessment Tool Used: AM-PAC 6 Clicks Basic Mobility    Ramond Dial 10/15/2016, 12:45 PM   Mee Hives, PT MS Acute Rehab Dept. Number: [place] and [place]

## 2016-10-16 LAB — BASIC METABOLIC PANEL
Anion gap: 7 (ref 5–15)
BUN: 19 mg/dL (ref 6–20)
CO2: 20 mmol/L — ABNORMAL LOW (ref 22–32)
Calcium: 9.5 mg/dL (ref 8.9–10.3)
Chloride: 113 mmol/L — ABNORMAL HIGH (ref 101–111)
Creatinine, Ser: 1.26 mg/dL — ABNORMAL HIGH (ref 0.44–1.00)
GFR calc Af Amer: 50 mL/min — ABNORMAL LOW (ref >60)
GFR calc non Af Amer: 43 mL/min — ABNORMAL LOW (ref >60)
Glucose, Bld: 95 mg/dL (ref 65–99)
Potassium: 3.7 mmol/L (ref 3.5–5.1)
Sodium: 140 mmol/L (ref 135–145)

## 2016-10-16 LAB — IRON AND TIBC
Iron: 17 ug/dL — ABNORMAL LOW (ref 28–170)
Saturation Ratios: 6 % — ABNORMAL LOW (ref 10.4–31.8)
TIBC: 280 ug/dL (ref 250–450)
UIBC: 263 ug/dL

## 2016-10-16 LAB — CBC
HCT: 28.4 % — ABNORMAL LOW (ref 36.0–46.0)
Hemoglobin: 9.1 g/dL — ABNORMAL LOW (ref 12.0–15.0)
MCH: 28.4 pg (ref 26.0–34.0)
MCHC: 32 g/dL (ref 30.0–36.0)
MCV: 88.8 fL (ref 78.0–100.0)
Platelets: 303 10*3/uL (ref 150–400)
RBC: 3.2 MIL/uL — ABNORMAL LOW (ref 3.87–5.11)
RDW: 14.8 % (ref 11.5–15.5)
WBC: 7.4 10*3/uL (ref 4.0–10.5)

## 2016-10-16 LAB — FERRITIN: Ferritin: 64 ng/mL (ref 11–307)

## 2016-10-16 LAB — GLUCOSE, CAPILLARY: Glucose-Capillary: 80 mg/dL (ref 65–99)

## 2016-10-16 LAB — RETICULOCYTES
RBC.: 3.2 MIL/uL — ABNORMAL LOW (ref 3.87–5.11)
Retic Ct Pct: <0.4 % — ABNORMAL LOW (ref 0.4–3.1)

## 2016-10-16 LAB — PHOSPHORUS: Phosphorus: 3.4 mg/dL (ref 2.5–4.6)

## 2016-10-16 LAB — FOLATE: Folate: 9.1 ng/mL (ref >5.9)

## 2016-10-16 LAB — VITAMIN B12: Vitamin B-12: 890 pg/mL (ref 180–914)

## 2016-10-16 MED ORDER — BACLOFEN 10 MG PO TABS: 10.0000 mg | ORAL_TABLET | Freq: Every day | ORAL | 0 refills | Status: DC | PRN

## 2016-10-16 MED ORDER — TRIMETHOPRIM 100 MG PO TABS: 100.0000 mg | ORAL_TABLET | Freq: Every day | ORAL | Status: DC

## 2016-10-16 MED ORDER — CEPHALEXIN 500 MG PO CAPS: 500.0000 mg | ORAL_CAPSULE | Freq: Three times a day (TID) | ORAL | 0 refills | Status: AC

## 2016-10-16 NOTE — Discharge Summary (Signed)
[facility]  Physician Discharge Summary   Patient ID: [name] MRN: [id number] DOB/AGE: 12/02/1950 66 y.o.  Admit date: 10/13/2016 Discharge date: 10/16/2016  PCP: Cassandria Anger, MD  DISCHARGE DIAGNOSES:  Principal Problem:   Syncope Active Problems:   Migraine headache   Asthma   Anxiety   Hyperlipidemia   UTI (urinary tract infection)   CKD (chronic kidney disease) stage 3, GFR 30-59 ml/min   Chronic depression   Hypothyroidism   RECOMMENDATIONS FOR OUTPATIENT FOLLOW UP: 1. Neurology recommends follow-up with her outpatient provider for home/ambulatory EEG. 2. 30 day event monitor to be arranged by cardiology   DISCHARGE CONDITION: fair  Diet recommendation: As before  Filed Weights   10/14/16 0547 10/15/16 0700 10/16/16 0300  Weight: 60 kg (132 lb 3.2 oz) 61.3 kg (135 lb 3.2 oz) 64.6 kg (142 lb 8 oz)    INITIAL HISTORY: 66 year old Caucasian female with a past medical history of asthma, gastritis, GERD, hypothyroidism, irritable bowel syndrome, chronic depression with anxiety, migraine headaches and dyslipidemia presented with 2 episodes of syncope. She had urinary incontinence associated with these episodes. She started feeling unwell about 5 days prior to hospitalization with the onset of migraine headaches. There is concern for seizure type activity. Patient was hospitalized for further management. She was also found to have a urinary tract infection.  Consultants: Neurology. Cardiology  Procedures:   EEG Impression: This awake EEG is normal.   Transthoracic echocardiogram Study Conclusions  - Left ventricle: The cavity size was normal. Wall thickness was   normal. Systolic function was normal. The estimated ejection   fraction was in the range of 60% to 65%. Wall motion was normal;   there were no regional wall motion abnormalities. Left   ventricular diastolic function parameters were normal. - Aortic valve: There was no  stenosis. - Mitral valve: There was trivial regurgitation. - Right ventricle: The cavity size was normal. Systolic function   was normal. - Tricuspid valve: Peak RV-RA gradient (S): 24 mm Hg. - Pulmonary arteries: PA peak pressure: 27 mm Hg (S). - Inferior vena cava: The vessel was normal in size. The   respirophasic diameter changes were in the normal range (= 50%),   consistent with normal central venous pressure.  Impressions:  - Normal study.   HOSPITAL COURSE:   Syncope Patient had 2 episodes of syncope at home, which were unwitnessed and associated with bladder incontinence. Initial concern was for seizure activity. EEG did not show any epileptiform activity. Patient seen by neurology. No recommendation for any anti-epileptiform medications. CT angiogram of the head and neck did not show any acute finding. Patient was also complaining of chest pain and dyspnea. EKG did not reveal any acute changes. She underwent a VQ scan which did not reveal any pulmonary embolism. Echocardiogram report as above. No concerning findings noted. Patient was seen by cardiology. Telemetry did show 2 brief episodes of SVT. But no recurrence of the same. Patient feels much better this morning. 30 day event monitor recommended by cardiology. No further workup to be done in the hospital. UTI could've also contributed.  Migraine headaches This has been ongoing for 5 days. Patient is on Topamax, tramadol, Neurontin, and Imitrex. Patient was given dexamethasone in the hospital. Neurology recommends follow-up with her outpatient provider for home/ambulatory EEG. Outpatient massage therapy also recommended.  Urinary tract infection with Klebsiella Patient was initially started on ceftriaxone. Sensitivities reviewed. Changed over to Keflex.   History of asthma. Stable  History  of chronic kidney disease stage III Renal function is stable at baseline. Mild non-anion gap metabolic acidosis was noted and is  improving.   History of hypothyroidism.  Continue Synthroid. TSH and Luan Moore is normal.  Normocytic anemia. Drop in hemoglobin noted. This was likely dilutional. No evidence for overt bleeding. Iron 17. Ferritin 64. B12 890.   Overall, stable. Feels better this morning. Okay for discharge.    PERTINENT LABS:  The results of significant diagnostics from this hospitalization (including imaging, microbiology, ancillary and laboratory) are listed below for reference.    Microbiology: Recent Results (from the past 240 hour(s))  Culture, Urine     Status: Abnormal   Collection Time: 10/13/16  3:19 PM  Result Value Ref Range Status   Specimen Description URINE, RANDOM  Final   Special Requests NONE  Final   Culture >=100,000 COLONIES/mL KLEBSIELLA PNEUMONIAE (A)  Final   Report Status 10/15/2016 FINAL  Final   Organism ID, Bacteria KLEBSIELLA PNEUMONIAE (A)  Final      Susceptibility   Klebsiella pneumoniae - MIC*    AMPICILLIN >=32 RESISTANT Resistant     CEFAZOLIN <=4 SENSITIVE Sensitive     CEFTRIAXONE <=1 SENSITIVE Sensitive     CIPROFLOXACIN <=0.25 SENSITIVE Sensitive     GENTAMICIN <=1 SENSITIVE Sensitive     IMIPENEM <=0.25 SENSITIVE Sensitive     NITROFURANTOIN 64 INTERMEDIATE Intermediate     TRIMETH/SULFA >=320 RESISTANT Resistant     AMPICILLIN/SULBACTAM 8 SENSITIVE Sensitive     PIP/TAZO 8 SENSITIVE Sensitive     Extended ESBL NEGATIVE Sensitive     * >=100,000 COLONIES/mL KLEBSIELLA PNEUMONIAE     Labs: Basic Metabolic Panel:  Recent Labs Lab 10/13/16 1321 10/13/16 1337 10/13/16 1917 10/14/16 0539 10/15/16 0339 10/16/16 0406  NA 138 140  --  139 142 140  K 3.9 3.9  --  4.2 3.4* 3.7  CL 110 109  --  115* 116* 113*  CO2 21*  --   --  16* 20* 20*  GLUCOSE 103* 102*  --  135* 111* 95  BUN 14 17  --  16 22* 19  CREATININE 1.43* 1.40*  --  1.18* 1.34* 1.26*  CALCIUM 10.3  --   --  9.7 9.8 9.5  MG  --   --  1.7  --   --   --   PHOS  --   --  1.7*  --   --   3.4   Liver Function Tests:  Recent Labs Lab 10/13/16 1321  AST 24  ALT 16  ALKPHOS 116  BILITOT 0.5  PROT 6.6  ALBUMIN 3.0*   CBC:  Recent Labs Lab 10/13/16 1321 10/13/16 1337 10/14/16 0539 10/15/16 0339 10/16/16 0406  WBC 14.0*  --  11.1* 10.0 7.4  NEUTROABS 12.0*  --   --   --   --   HGB 11.0* 11.2* 10.3* 8.9* 9.1*  HCT 34.1* 33.0* 32.3* 28.3* 28.4*  MCV 90.2  --  89.5 89.0 88.8  PLT 253  --  248 257 303   Cardiac Enzymes:  Recent Labs Lab 10/13/16 1321  CKTOTAL 33*  TROPONINI <0.03   CBG:  Recent Labs Lab 10/14/16 0627 10/15/16 0630 10/16/16 0633  GLUCAP 143* 89 80     IMAGING STUDIES Ct Angio Head W Or Wo Contrast  Result Date: 10/13/2016 CLINICAL DATA:  66 year old female with dizziness, posterior head and neck pain for 4 days. Syncopal episode last night. Diffuse weakness. EXAM: CT ANGIOGRAPHY HEAD  AND NECK TECHNIQUE: Multidetector CT imaging of the head and neck was performed using the standard protocol during bolus administration of intravenous contrast. Multiplanar CT image reconstructions and MIPs were obtained to evaluate the vascular anatomy. Carotid stenosis measurements (when applicable) are obtained utilizing NASCET criteria, using the distal internal carotid diameter as the denominator. CONTRAST:  50 mL Isovue 370 COMPARISON:  CT cervical myelogram 08/18/2015. Outside brain MRI 04/29/2015. CT head and cervical spine 09/12/2014. Chest CT 09/04/2015. FINDINGS: CT HEAD Brain: Cerebral volume is stable since 2016 and remains normal for age. No midline shift, ventriculomegaly, mass effect, evidence of mass lesion, intracranial hemorrhage or evidence of cortically based acute infarction. Gray-white matter differentiation is within normal limits throughout the brain. Calvarium and skull base: Negative. Paranasal sinuses: Clear. Orbits: Visualized orbits and scalp soft tissues are within normal limits. CTA NECK Skeleton: Extensive prior cervical spine  surgery with addition of C1-C3 level posterior fusion hardware since the 2017 CT myelogram. No acute osseous abnormality identified. Upper chest: Negative visualized superior mediastinum. Stable small calcified right upper lobe granuloma. Minor dependent atelectasis. Other neck: Negative.  No cervical lymphadenopathy. Aortic arch: 3 vessel arch configuration.  No arch atherosclerosis. Right carotid system: Mildly tortuous brachiocephalic artery with a mildly kinked appearance at the right thoracic inlet. Normal right CCA origin. Normal right CCA and right carotid bifurcation. Normal cervical right ICA aside from mild tortuosity. Left carotid system: Normal left CCA origin. Normal left CCA. Normal left carotid bifurcation. Normal cervical left ICA aside from mild tortuosity. Vertebral arteries: No proximal right subclavian atherosclerosis or stenosis. Normal right vertebral artery origin. Mildly dolichoectatic but otherwise normal cervical right vertebral artery. No proximal left subclavian atherosclerosis or stenosis. Normal left vertebral artery origin. Mildly tortuous but otherwise normal left vertebral artery to the skullbase. CTA HEAD Posterior circulation: Normal distal vertebral arteries. Normal PICA origins and vertebrobasilar junction. Basilar artery, AICA origins, SCA origins and PCA origins are patent without stenosis. Posterior communicating arteries are diminutive or absent. Bilateral PCA branches are normal. Anterior circulation: Both ICA siphons are patent with minimal calcified plaque on the left. No siphon stenosis. Normal carotid termini. Normal MCA and ACA origins. Mildly ectatic anterior communicating artery. Bilateral ACA branches are within normal limits. Left MCA M1 segment, trifurcation, and left MCA branches are within normal limits. Right MCA M1 segment, trifurcation, and right MCA branches are within normal limits. Venous sinuses: Patent. Anatomic variants: None. Delayed phase: No abnormal  enhancement identified. Review of the MIP images confirms the above findings IMPRESSION: 1. Normal arterial findings on CTA head and neck except for tortuosity of the cervical carotid and vertebral arteries. 2.  Normal for age CT appearance of the brain. 3. Extensive prior cervical spine surgery. No acute osseous abnormality identified. Electronically Signed   By: Genevie Ann M.D.   On: 10/13/2016 15:32   Dg Chest 2 View  Result Date: 10/15/2016 CLINICAL DATA:  Syncope.  Shortness of breath . EXAM: CHEST  2 VIEW COMPARISON:  01/26/2016 . FINDINGS: Mediastinum and hilar structures normal. Stable calcified nodule right upper lobe, unchanged from prior chest x-ray of 01/26/2016 and CT from 09/04/2015 consistent with granuloma . Mild left base subsegmental atelectasis. Cardiomegaly with normal pulmonary vascularity. Degenerative changes thoracic spine. Cervicothoracic spine fusion. IMPRESSION: No active cardiopulmonary disease. Electronically Signed   By: Marcello Moores  Register   On: 10/15/2016 16:51   Ct Angio Neck W And/or Wo Contrast  Result Date: 10/13/2016 CLINICAL DATA:  66 year old female with dizziness, posterior head and neck  pain for 4 days. Syncopal episode last night. Diffuse weakness. EXAM: CT ANGIOGRAPHY HEAD AND NECK TECHNIQUE: Multidetector CT imaging of the head and neck was performed using the standard protocol during bolus administration of intravenous contrast. Multiplanar CT image reconstructions and MIPs were obtained to evaluate the vascular anatomy. Carotid stenosis measurements (when applicable) are obtained utilizing NASCET criteria, using the distal internal carotid diameter as the denominator. CONTRAST:  50 mL Isovue 370 COMPARISON:  CT cervical myelogram 08/18/2015. Outside brain MRI 04/29/2015. CT head and cervical spine 09/12/2014. Chest CT 09/04/2015. FINDINGS: CT HEAD Brain: Cerebral volume is stable since 2016 and remains normal for age. No midline shift, ventriculomegaly, mass effect,  evidence of mass lesion, intracranial hemorrhage or evidence of cortically based acute infarction. Gray-white matter differentiation is within normal limits throughout the brain. Calvarium and skull base: Negative. Paranasal sinuses: Clear. Orbits: Visualized orbits and scalp soft tissues are within normal limits. CTA NECK Skeleton: Extensive prior cervical spine surgery with addition of C1-C3 level posterior fusion hardware since the 2017 CT myelogram. No acute osseous abnormality identified. Upper chest: Negative visualized superior mediastinum. Stable small calcified right upper lobe granuloma. Minor dependent atelectasis. Other neck: Negative.  No cervical lymphadenopathy. Aortic arch: 3 vessel arch configuration.  No arch atherosclerosis. Right carotid system: Mildly tortuous brachiocephalic artery with a mildly kinked appearance at the right thoracic inlet. Normal right CCA origin. Normal right CCA and right carotid bifurcation. Normal cervical right ICA aside from mild tortuosity. Left carotid system: Normal left CCA origin. Normal left CCA. Normal left carotid bifurcation. Normal cervical left ICA aside from mild tortuosity. Vertebral arteries: No proximal right subclavian atherosclerosis or stenosis. Normal right vertebral artery origin. Mildly dolichoectatic but otherwise normal cervical right vertebral artery. No proximal left subclavian atherosclerosis or stenosis. Normal left vertebral artery origin. Mildly tortuous but otherwise normal left vertebral artery to the skullbase. CTA HEAD Posterior circulation: Normal distal vertebral arteries. Normal PICA origins and vertebrobasilar junction. Basilar artery, AICA origins, SCA origins and PCA origins are patent without stenosis. Posterior communicating arteries are diminutive or absent. Bilateral PCA branches are normal. Anterior circulation: Both ICA siphons are patent with minimal calcified plaque on the left. No siphon stenosis. Normal carotid termini.  Normal MCA and ACA origins. Mildly ectatic anterior communicating artery. Bilateral ACA branches are within normal limits. Left MCA M1 segment, trifurcation, and left MCA branches are within normal limits. Right MCA M1 segment, trifurcation, and right MCA branches are within normal limits. Venous sinuses: Patent. Anatomic variants: None. Delayed phase: No abnormal enhancement identified. Review of the MIP images confirms the above findings IMPRESSION: 1. Normal arterial findings on CTA head and neck except for tortuosity of the cervical carotid and vertebral arteries. 2.  Normal for age CT appearance of the brain. 3. Extensive prior cervical spine surgery. No acute osseous abnormality identified. Electronically Signed   By: Genevie Ann M.D.   On: 10/13/2016 15:32   Nm Pulmonary Perf And Vent  Result Date: 10/15/2016 CLINICAL DATA:  Dyspnea and syncopal episode EXAM: NUCLEAR MEDICINE VENTILATION - PERFUSION LUNG SCAN TECHNIQUE: Ventilation images were obtained in multiple projections using inhaled aerosol Tc-60m DTPA. Perfusion images were obtained in multiple projections after intravenous injection of Tc-55m MAA. RADIOPHARMACEUTICALS:  31.8 mCi Technetium-9m DTPA aerosol inhalation and 4.34 mCi Technetium-51m MAA IV COMPARISON:  Chest x-ray from earlier in the same day. FINDINGS: Ventilation: No focal ventilation defect. Perfusion: No wedge shaped peripheral perfusion defects to suggest acute pulmonary embolism. IMPRESSION: No ventilation-perfusion mismatch to suggest  pulmonary embolism is noted. Electronically Signed   By: Inez Catalina M.D.   On: 10/15/2016 17:31    DISCHARGE EXAMINATION: Vitals:   10/15/16 2207 10/16/16 0300 10/16/16 0401 10/16/16 0930  BP: 126/76  (!) 142/68 135/82  Pulse: 72  63 70  Resp:   18   Temp:   98 F (36.7 C)   TempSrc:   Oral   SpO2: 99%  97% 99%  Weight:  64.6 kg (142 lb 8 oz)    Height:       General appearance: alert, cooperative, appears stated age and no  distress Resp: clear to auscultation bilaterally Cardio: regular rate and rhythm, S1, S2 normal, no murmur, click, rub or gallop GI: soft, non-tender; bowel sounds normal; no masses,  no organomegaly Extremities: extremities normal, atraumatic, no cyanosis or edema  DISPOSITION: Home  Discharge Instructions    Ambulatory referral to Physical Therapy    Complete by:  As directed    Call MD for:  extreme fatigue    Complete by:  As directed    Call MD for:  persistant dizziness or light-headedness    Complete by:  As directed    Call MD for:  persistant nausea and vomiting    Complete by:  As directed    Call MD for:  severe uncontrolled pain    Complete by:  As directed    Call MD for:  temperature >100.4    Complete by:  As directed    Diet - low sodium heart healthy    Complete by:  As directed    Discharge instructions    Complete by:  As directed    You should get a call from cardiology office regarding event monitor. If you don't hear by Tuesday please contact your PCP. Please follow up with your PCP and neurologist. Seek attention if symptoms recur.  You were cared for by a hospitalist during your hospital stay. If you have any questions about your discharge medications or the care you received while you were in the hospital after you are discharged, you can call the unit and asked to speak with the hospitalist on call if the hospitalist that took care of you is not available. Once you are discharged, your primary care physician will handle any further medical issues. Please note that NO REFILLS for any discharge medications will be authorized once you are discharged, as it is imperative that you return to your primary care physician (or establish a relationship with a primary care physician if you do not have one) for your aftercare needs so that they can reassess your need for medications and monitor your lab values. If you do not have a primary care physician, you can call [phone]  for a physician referral.   Increase activity slowly    Complete by:  As directed       ALLERGIES:  Allergies  Allergen Reactions  . Buprenorphine Hcl Other (See Comments)    Severe headache, confusion  . Morphine And Related Other (See Comments)    Severe headache  . Codeine Other (See Comments)    Disoriented  . Dust Mite Extract Cough  . Molds & Smuts Cough     Discharge Medication List as of 10/16/2016 10:40 AM    START taking these medications   Details  cephALEXin (KEFLEX) 500 MG capsule Take 1 capsule (500 mg total) by mouth every 8 (eight) hours., Starting Sat 10/16/2016, Until Wed 10/20/2016, Print  CONTINUE these medications which have CHANGED   Details  baclofen (LIORESAL) 10 MG tablet Take 1 tablet (10 mg total) by mouth daily as needed for muscle spasms., Starting Sat 10/16/2016, Print    trimethoprim (TRIMPEX) 100 MG tablet Take 1 tablet (100 mg total) by mouth daily. RESUME AFTER YOU COMPLETE THE COURSE OF KEFLEX, Starting Sat 10/16/2016, No Print      CONTINUE these medications which have NOT CHANGED   Details  ALPRAZolam (XANAX) 0.5 MG tablet Take 1 tablet (0.5 mg total) by mouth 2 (two) times daily as needed for anxiety., Starting Wed 08/11/2016, Print    busPIRone (BUSPAR) 10 MG tablet Take 1 tablet (10 mg total) by mouth 2 (two) times daily., Starting Fri 08/06/2016, Normal    Cholecalciferol (D3-1000) 1000 units capsule Take 1,000 Units by mouth daily., Historical Med    Cyanocobalamin (VITAMIN B-12) 1000 MCG SUBL Place 1 tablet (1,000 mcg total) under the tongue daily., Starting Wed 05/05/2016, Normal    dicyclomine (BENTYL) 10 MG capsule Take 1 tab twice daily., Normal    esomeprazole (NEXIUM) 40 MG capsule Take 1 capsule (40 mg total) by mouth 2 (two) times daily before a meal., Starting Mon 10/28/2014, No Print    estradiol (ESTRACE) 1 MG tablet Take 1 tablet (1 mg total) by mouth daily., Starting Mon 10/28/2014, No Print    fexofenadine (ALLEGRA) 180  MG tablet Take 180 mg by mouth daily as needed for allergies or rhinitis. , Historical Med    FLUoxetine (PROZAC) 40 MG capsule Take 1 capsule (40 mg total) by mouth every morning., Starting Thu 07/01/2016, Normal    fluticasone (FLONASE) 50 MCG/ACT nasal spray Place 1 spray into both nostrils 2 (two) times daily as needed for allergies or rhinitis. , Historical Med    iron polysaccharides (FERREX 150) 150 MG capsule Take 150 mg by mouth 2 (two) times daily., Historical Med    levothyroxine (SYNTHROID, LEVOTHROID) 100 MCG tablet Take 1 tablet (100 mcg total) by mouth daily before breakfast., Starting Thu 07/01/2016, Normal    mirtazapine (REMERON) 30 MG tablet Take 1 tablet (30 mg total) by mouth at bedtime., Starting Thu 07/01/2016, Normal    Multiple Vitamins-Minerals (MULTIVITAMIN WITH MINERALS) tablet Take 1 tablet by mouth daily., Starting Mon 10/28/2014, No Print    ondansetron (ZOFRAN-ODT) 4 MG disintegrating tablet Take 4 mg by mouth every 8 (eight) hours as needed for nausea or vomiting. , Starting Wed 07/14/2016, Historical Med    progesterone (PROMETRIUM) 100 MG capsule Take 100 mg by mouth at bedtime., Historical Med    topiramate (TOPAMAX) 50 MG tablet Take 1 tablet (50 mg total) by mouth at bedtime., Starting Thu 07/01/2016, Normal    traMADol (ULTRAM) 50 MG tablet Take 50 mg by mouth every 6 (six) hours as needed (for pain). , Historical Med    gabapentin (NEURONTIN) 100 MG capsule Take 1 capsule (100 mg total) by mouth 3 (three) times daily. One three times daily, Starting Wed 08/11/2016, Print    SUMAtriptan (IMITREX) 6 MG/0.5ML SOLN injection Inject 6 mg into the skin every 2 (two) hours as needed for migraine or headache. Reported on 08/11/2015, Until Discontinued, Historical Med      STOP taking these medications     naproxen sodium (ANAPROX) 220 MG tablet      Polysacchar Iron-FA-B12 ([place] 150 FORTE) 150-1-25 MG-MG-MCG CAPS          Follow-up Information     Plotnikov, [name], MD. Schedule an appointment as  soon as possible for a visit in 1 week(s).   Specialty:  Internal Medicine Why:  post hospital follow up Contact information: [place] [address] [phone]        Alda Berthold, DO. Schedule an appointment as soon as possible for a visit in 3 week(s).   Specialty:  Neurology Why:  for migraines Contact information: [place] [address] [place] [place] [address] [place] TIME: 35 minutes  [facility] Pager [phone]  10/16/2016, 4:22 PM

## 2016-10-16 NOTE — Progress Notes (Signed)
Discharge instructions reviewed with patient, questions answered, verbalized understanding.  Patient transported to front of hospital to be taken home by parents.

## 2016-10-16 NOTE — Discharge Instructions (Signed)
Syncope °Syncope is when you temporarily lose consciousness. Syncope may also be called fainting or passing out. It is caused by a sudden decrease in blood flow to the brain. Even though most causes of syncope are not dangerous, syncope can be a sign of a serious medical problem. Signs that you may be about to faint include: °· Feeling dizzy or light-headed. °· Feeling nauseous. °· Seeing all white or all black in your field of vision. °· Having cold, clammy skin. °If you fainted, get medical help right away.Call your local emergency services (911 in the [place] Do not drive yourself to the hospital. °Follow these instructions at home: °Pay attention to any changes in your symptoms. Take these actions to help with your condition: °· Have someone stay with you until you feel stable. °· Do not drive, use machinery, or play sports until your health care provider says it is okay. °· Keep all follow-up visits as told by your health care provider. This is important. °· If you start to feel like you might faint, lie down right away and raise (elevate) your feet above the level of your heart. Breathe deeply and steadily. Wait until all of the symptoms have passed. °· Drink enough fluid to keep your urine clear or pale yellow. °· If you are taking blood pressure or heart medicine, get up slowly and take several minutes to sit and then stand. This can reduce dizziness. °· Take over-the-counter and prescription medicines only as told by your health care provider. °Get help right away if: °· You have a severe headache. °· You have unusual pain in your chest, abdomen, or back. °· You are bleeding from your mouth or rectum, or you have black or tarry stool. °· You have a very fast or irregular heartbeat (palpitations). °· You have pain with breathing. °· You faint once or repeatedly. °· You have a seizure. °· You are confused. °· You have trouble walking. °· You have severe weakness. °· You have vision problems. °These symptoms  may represent a serious problem that is an emergency. Do not wait to see if your symptoms will go away. Get medical help right away. Call your local emergency services (911 in the [place] Do not drive yourself to the hospital. °This information is not intended to replace advice given to you by your health care provider. Make sure you discuss any questions you have with your health care provider. °Document Released: 05/03/2005 Document Revised: 10/09/2015 Document Reviewed: 01/15/2015 °Elsevier Interactive Patient Education © 2017 [employer] ° °

## 2016-10-16 NOTE — Progress Notes (Signed)
OT Cancellation Note  Patient Details Name: [name] MRN: [id number] DOB: 1951-04-22   Cancelled Treatment:    Reason Eval/Treat Not Completed: OT screened, no needs identified, will sign off. Reviewed fall prevention strategies to minimize fall risk especially first few days after returning home from hospital. OT signing off.  Redmond Baseman, MS, OTR/L 10/16/2016, 10:13 AM

## 2016-10-18 DIAGNOSIS — M542 Cervicalgia: Secondary | ICD-10-CM | POA: Diagnosis not present

## 2016-10-18 DIAGNOSIS — M5031 Other cervical disc degeneration,  high cervical region: Secondary | ICD-10-CM | POA: Diagnosis not present

## 2016-10-18 DIAGNOSIS — G43909 Migraine, unspecified, not intractable, without status migrainosus: Secondary | ICD-10-CM | POA: Diagnosis not present

## 2016-10-18 DIAGNOSIS — M4312 Spondylolisthesis, cervical region: Secondary | ICD-10-CM | POA: Diagnosis not present

## 2016-10-18 DIAGNOSIS — Z981 Arthrodesis status: Secondary | ICD-10-CM | POA: Diagnosis not present

## 2016-10-18 DIAGNOSIS — R110 Nausea: Secondary | ICD-10-CM | POA: Diagnosis not present

## 2016-10-18 DIAGNOSIS — M4692 Unspecified inflammatory spondylopathy, cervical region: Secondary | ICD-10-CM | POA: Diagnosis not present

## 2016-10-19 ENCOUNTER — Ambulatory Visit (HOSPITAL_COMMUNITY)
Admission: RE | Admit: 2016-10-19 | Discharge: 2016-10-19 | Disposition: A | Discharge status: Home / Self Care | Payer: Medicare Other | Source: Ambulatory Visit | Attending: Gynecologic Oncology | Admitting: Gynecologic Oncology

## 2016-10-19 DIAGNOSIS — R918 Other nonspecific abnormal finding of lung field: Secondary | ICD-10-CM | POA: Diagnosis not present

## 2016-10-19 DIAGNOSIS — N200 Calculus of kidney: Secondary | ICD-10-CM | POA: Insufficient documentation

## 2016-10-19 DIAGNOSIS — C499 Malignant neoplasm of connective and soft tissue, unspecified: Secondary | ICD-10-CM

## 2016-10-19 DIAGNOSIS — Z85831 Personal history of malignant neoplasm of soft tissue: Secondary | ICD-10-CM | POA: Insufficient documentation

## 2016-10-19 MED ORDER — IOPAMIDOL (ISOVUE-300) INJECTION 61%
INTRAVENOUS | Status: AC
  Filled 2016-10-19: qty 30

## 2016-10-19 MED ORDER — IOPAMIDOL (ISOVUE-300) INJECTION 61%
30.0000 mL | Freq: Once | INTRAVENOUS | Status: DC | PRN
  Administered 2016-10-19: 30 mL via ORAL
  Filled 2016-10-19: qty 30

## 2016-10-19 MED ORDER — IOPAMIDOL (ISOVUE-300) INJECTION 61%
100.0000 mL | Freq: Once | INTRAVENOUS | Status: AC | PRN
  Administered 2016-10-19: 100 mL via INTRAVENOUS

## 2016-10-19 MED ORDER — IOPAMIDOL (ISOVUE-300) INJECTION 61%
INTRAVENOUS | Status: AC
  Filled 2016-10-19: qty 100

## 2016-10-20 ENCOUNTER — Ambulatory Visit (HOSPITAL_COMMUNITY): Payer: Medicare Other

## 2016-10-21 DIAGNOSIS — N390 Urinary tract infection, site not specified: Secondary | ICD-10-CM | POA: Diagnosis not present

## 2016-10-22 DIAGNOSIS — N390 Urinary tract infection, site not specified: Secondary | ICD-10-CM | POA: Diagnosis not present

## 2016-10-27 ENCOUNTER — Encounter: Payer: Self-pay | Admitting: Family

## 2016-10-27 ENCOUNTER — Other Ambulatory Visit (INDEPENDENT_AMBULATORY_CARE_PROVIDER_SITE_OTHER): Payer: Medicare Other

## 2016-10-27 ENCOUNTER — Ambulatory Visit (INDEPENDENT_AMBULATORY_CARE_PROVIDER_SITE_OTHER): Payer: Medicare Other | Admitting: Family

## 2016-10-27 VITALS — BP 146/100 | HR 70 | Temp 98.3°F | Resp 16 | Ht 63.0 in | Wt 136.0 lb

## 2016-10-27 DIAGNOSIS — G43019 Migraine without aura, intractable, without status migrainosus: Secondary | ICD-10-CM

## 2016-10-27 DIAGNOSIS — N390 Urinary tract infection, site not specified: Secondary | ICD-10-CM

## 2016-10-27 DIAGNOSIS — R55 Syncope and collapse: Secondary | ICD-10-CM

## 2016-10-27 LAB — CBC WITH DIFFERENTIAL/PLATELET
Basophils Absolute: 0.1 10*3/uL (ref 0.0–0.1)
Basophils Relative: 1.2 % (ref 0.0–3.0)
Eosinophils Absolute: 0.1 10*3/uL (ref 0.0–0.7)
Eosinophils Relative: 1.7 % (ref 0.0–5.0)
HCT: 35 % — ABNORMAL LOW (ref 36.0–46.0)
Hemoglobin: 11.6 g/dL — ABNORMAL LOW (ref 12.0–15.0)
Lymphocytes Relative: 20.1 % (ref 12.0–46.0)
Lymphs Abs: 1.6 10*3/uL (ref 0.7–4.0)
MCHC: 33.2 g/dL (ref 30.0–36.0)
MCV: 87.2 fl (ref 78.0–100.0)
Monocytes Absolute: 0.4 10*3/uL (ref 0.1–1.0)
Monocytes Relative: 4.8 % (ref 3.0–12.0)
Neutro Abs: 5.6 10*3/uL (ref 1.4–7.7)
Neutrophils Relative %: 72.2 % (ref 43.0–77.0)
Platelets: 515 10*3/uL — ABNORMAL HIGH (ref 150.0–400.0)
RBC: 4.01 Mil/uL (ref 3.87–5.11)
RDW: 15.2 % (ref 11.5–15.5)
WBC: 7.8 10*3/uL (ref 4.0–10.5)

## 2016-10-27 LAB — COMPREHENSIVE METABOLIC PANEL
ALT: 9 U/L (ref 0–35)
AST: 14 U/L (ref 0–37)
Albumin: 4.1 g/dL (ref 3.5–5.2)
Alkaline Phosphatase: 106 U/L (ref 39–117)
BUN: 17 mg/dL (ref 6–23)
CO2: 25 mEq/L (ref 19–32)
Calcium: 10.9 mg/dL — ABNORMAL HIGH (ref 8.4–10.5)
Chloride: 107 mEq/L (ref 96–112)
Creatinine, Ser: 1.26 mg/dL — ABNORMAL HIGH (ref 0.40–1.20)
GFR: 45.05 mL/min — ABNORMAL LOW (ref >60.00)
Glucose, Bld: 102 mg/dL — ABNORMAL HIGH (ref 70–99)
Potassium: 4.1 mEq/L (ref 3.5–5.1)
Sodium: 137 mEq/L (ref 135–145)
Total Bilirubin: 0.3 mg/dL (ref 0.2–1.2)
Total Protein: 7.3 g/dL (ref 6.0–8.3)

## 2016-10-27 NOTE — Progress Notes (Signed)
Subjective:    Patient ID: [name], female    DOB: January 12, 1951, 66 y.o.   MRN: [id number]  Chief Complaint  Patient presents with  . Hospitalization Follow-up    had a couple of episodes of Syncope and had to go to ED, was told they found a UTI but nothing else was found, was told to come to PCP for cbc with diff    HPI:  [name] is a 66 y.o. 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 and presents today for a hospitalization follow up.  Recently evaluated in the emergency department and admitted to the hospital with new onset headache, dizziness, and syncope. She describes getting up off the side of the bed and collapsed unconscious for as much as 10 minutes. She had to crawl to the dresser to change because she urinated on herself. The following day she continued to be dizzy especially with movement or standing. The morning prior to arrival she again passed out and thinks for approximately 20-30 minutes and may have struck the back of her head as well as a mild contusion to her left forearm. No history of seizures. Neurological exam is normal in the emergency department. Blood work showed mild elevation in white blood cell count, mild anemia, and a positive urinalysis for UTI.During hospitalization EEG did not show any seizure activity with a VQ scan with no evidence of pulmonary embolism. Echocardiogram was normal..  Did have brief episode of SVT not requiring treatment. Cardiology recommended 30 day event monitor. Neurology recommended outpatient EEG as follow up. She was prescribed Keflex for the UTI. All hospital records, labs and imaging were reviewed in detail.   Reports no further episodes of syncope since leaving the hospital. Has completed the course of Keflex and has returned to taking Bactrim daily for urinary tract prevention. Followed up with urology with recommendation for continued Bactrim and for CBC. She has follow planned with neurology for both the migraines and syncopal episodes. No fevers, chills, headaches or urinary symptoms.   Allergies  Allergen Reactions  . Buprenorphine Hcl Other (See Comments)    Severe headache, confusion  . Morphine And Related Other (See Comments)    Severe headache  . Codeine Other (See Comments)    Disoriented  . Dust Mite Extract Cough  . Molds & Smuts Cough      Outpatient Medications Prior to Visit  Medication Sig Dispense Refill  . ALPRAZolam (XANAX) 0.5 MG tablet Take 1 tablet (0.5 mg total) by mouth 2 (two) times daily as needed for anxiety. 60 tablet 0  . baclofen (LIORESAL) 10 MG tablet Take 1 tablet (10 mg total) by mouth daily as needed for muscle spasms. 20 each 0  . busPIRone (BUSPAR) 10 MG tablet Take 1 tablet (10 mg total) by mouth 2 (two) times daily. 180 tablet 2  . Cholecalciferol (D3-1000) 1000 units capsule Take 1,000 Units by mouth daily.    . Cyanocobalamin (VITAMIN B-12) 1000 MCG SUBL Place 1 tablet (1,000 mcg total) under the tongue daily. 100 tablet 3  . dicyclomine (BENTYL) 10 MG  capsule Take 1 tab twice daily. (Patient taking differently: Take 10 mg by mouth 2 (two) times daily. Take 1 tab twice daily.) 180 capsule 2  . esomeprazole (NEXIUM) 40 MG capsule Take 1 capsule (40 mg total) by mouth 2 (two) times daily before a meal. 180 capsule 1  . estradiol (ESTRACE) 1 MG tablet Take 1 tablet (1 mg total) by mouth daily. 90 tablet 3   . fexofenadine (ALLEGRA) 180 MG tablet Take 180 mg by mouth daily as needed for allergies or rhinitis.     Marland Kitchen FLUoxetine (PROZAC) 40 MG capsule Take 1 capsule (40 mg total) by mouth every morning. 90 capsule 2  . fluticasone (FLONASE) 50 MCG/ACT nasal spray Place 1 spray into both nostrils 2 (two) times daily as needed for allergies or rhinitis.     Marland Kitchen gabapentin (NEURONTIN) 100 MG capsule Take 1 capsule (100 mg total) by mouth 3 (three) times daily. One three times daily 90 capsule 2  . iron polysaccharides (FERREX 150) 150 MG capsule Take 150 mg by mouth 2 (two) times daily.    Marland Kitchen levothyroxine (SYNTHROID, LEVOTHROID) 100 MCG tablet Take 1 tablet (100 mcg total) by mouth daily before breakfast. 90 tablet 2  . mirtazapine (REMERON) 30 MG tablet Take 1 tablet (30 mg total) by mouth at bedtime. 90 tablet 2  . Multiple Vitamins-Minerals (MULTIVITAMIN WITH MINERALS) tablet Take 1 tablet by mouth daily.    . ondansetron (ZOFRAN-ODT) 4 MG disintegrating tablet Take 4 mg by mouth every 8 (eight) hours as needed for nausea or vomiting.   0  . progesterone (PROMETRIUM) 100 MG capsule Take 100 mg by mouth at bedtime.    . SUMAtriptan (IMITREX) 6 MG/0.5ML SOLN injection Inject 6 mg into the skin every 2 (two) hours as needed for migraine or headache. Reported on 08/11/2015    . topiramate (TOPAMAX) 50 MG tablet Take 1 tablet (50 mg total) by mouth at bedtime. 90 tablet 2  . traMADol (ULTRAM) 50 MG tablet Take 50 mg by mouth every 6 (six) hours as needed (for pain).     Marland Kitchen trimethoprim (TRIMPEX) 100 MG tablet Take 1 tablet (100 mg total) by mouth daily. RESUME AFTER YOU COMPLETE THE COURSE OF KEFLEX     No facility-administered medications prior to visit.       Past Surgical History:  Procedure Laterality Date  . CESAREAN SECTION  1986  . CHOLECYSTECTOMY N/A 05/03/2013   Procedure: LAPAROSCOPIC CHOLECYSTECTOMY WITH INTRAOPERATIVE CHOLANGIOGRAM;  Surgeon: Odis Hollingshead, MD;  Location: [facility];  Service:  General;  Laterality: N/A;  . COLONOSCOPY    . laprotomy  04/2012   for cancer  . NECK SURGERY  2006   C spine fusion  . PUBOVAGINAL SLING    . VAGINAL HYSTERECTOMY        Past Medical History:  Diagnosis Date  . Allergic rhinitis, cause unspecified   . Anxiety   . Asthma    chronic cough  . Bipolar I disorder, most recent episode (or current) depressed, severe, without mention of psychotic behavior   . Blood transfusion without reported diagnosis   . Deep phlebothrombosis, postpartum, unspecified as to episode of care(671.40)   . Depression   . Fibromyalgia   . Gastritis   . GERD (gastroesophageal reflux disease)   . Hyperlipidemia   . IBS (irritable bowel syndrome)   . 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 Migraine headache   . Other and unspecified hyperlipidemia   .  Parathyroid adenoma 2014  . Status post chemotherapy 2014      Review of Systems  Constitutional: Negative for chills and fever.  Respiratory: Negative for chest tightness and shortness of breath.   Cardiovascular: Negative for chest pain, palpitations and leg swelling.  Genitourinary: Negative for decreased urine volume, dysuria, flank pain, hematuria and urgency.  Neurological: Negative for dizziness, seizures, syncope, weakness and headaches.      Objective:    BP (!) 146/100 (BP Location: Left Arm, Patient Position: Sitting, Cuff Size: Normal)   Pulse 70   Temp 98.3 F (36.8 C) (Oral)   Resp 16   Ht 5\' 3"  (1.6 m)   Wt 136 lb (61.7 kg)   SpO2 98%   BMI 24.09 kg/m  Nursing note and vital signs reviewed.  Physical Exam  Constitutional: She is oriented to person, place, and time. She appears well-developed and well-nourished. No distress.  Cardiovascular: Normal rate, regular rhythm, normal heart sounds and intact distal pulses.   Pulmonary/Chest: Effort normal and breath sounds normal.  Neurological: She is alert and oriented to person, place, and time.  Skin: Skin is warm  and dry.  Psychiatric: She has a normal mood and affect. Her behavior is normal. Judgment and thought content normal.       Assessment & Plan:   Problem List Items Addressed This Visit      Cardiovascular and Mediastinum   Syncope - Primary    No further episodes of syncope since leaving the hospital. Likely associated with urinary tract infection unknown to patient. No current urinary symptoms. Continue prophylaxis with Bactrim and follow-up with urology. Has follow-up with neurology for EEG and migraine headaches. No further treatment necessary at this time. Follow-up if symptoms return.      Relevant Orders   CBC w/Diff   Comprehensive metabolic panel   Migraine headache    No current headache with normal neurological exam. Continue to take medications as prescribed and follow-up with neurology as scheduled.        Genitourinary   Chronic urinary tract infection    Chronic urinary tract infection appears resolved with Keflex and now prophylaxis with Bactrim. No current urinary symptoms. Continue follow-up with urology.      Relevant Orders   CBC w/Diff   Comprehensive metabolic panel       I am having Ms. Masters [employer] maintain her SUMAtriptan, esomeprazole, estradiol, multivitamin with minerals, traMADol, fluticasone, Vitamin B-12, FLUoxetine, levothyroxine, mirtazapine, topiramate, dicyclomine, busPIRone, gabapentin, ALPRAZolam, iron polysaccharides, ondansetron, progesterone, fexofenadine, Cholecalciferol, baclofen, and trimethoprim.   Follow-up: Return if symptoms worsen or fail to improve.  Mauricio Po, FNP

## 2016-10-27 NOTE — Assessment & Plan Note (Signed)
No further episodes of syncope since leaving the hospital. Likely associated with urinary tract infection unknown to patient. No current urinary symptoms. Continue prophylaxis with Bactrim and follow-up with urology. Has follow-up with neurology for EEG and migraine headaches. No further treatment necessary at this time. Follow-up if symptoms return.

## 2016-10-27 NOTE — Assessment & Plan Note (Signed)
Chronic urinary tract infection appears resolved with Keflex and now prophylaxis with Bactrim. No current urinary symptoms. Continue follow-up with urology.

## 2016-10-27 NOTE — Assessment & Plan Note (Signed)
No current headache with normal neurological exam. Continue to take medications as prescribed and follow-up with neurology as scheduled.

## 2016-10-27 NOTE — Patient Instructions (Signed)
Thank you for choosing [facility].  SUMMARY AND INSTRUCTIONS:  Please continue to take your medication as prescribed.   We will check your CBC today.  Continue follow up with Dr. Alain Marion.  Labs:  Please stop by the lab on the lower level of the building for your blood work. Your results will be released to Dustin (or called to you) after review, usually within 72 hours after test completion. If any changes need to be made, you will be notified at that same time.  1.) The lab is open from 7:30am to 5:30 pm Monday-Friday 2.) No appointment is necessary 3.) Fasting (if needed) is 6-8 hours after food and drink; black coffee and water are okay   Follow up:  If your symptoms worsen or fail to improve, please contact our office for further instruction, or in case of emergency go directly to the emergency room at the closest medical facility.

## 2016-10-29 ENCOUNTER — Telehealth: Payer: Self-pay | Admitting: *Deleted

## 2016-10-29 ENCOUNTER — Ambulatory Visit: Payer: Self-pay | Admitting: Gynecology

## 2016-10-29 NOTE — Telephone Encounter (Signed)
Patient called and rescheduled her appt form this morning to July.

## 2016-11-01 DIAGNOSIS — N3000 Acute cystitis without hematuria: Secondary | ICD-10-CM | POA: Diagnosis not present

## 2016-11-07 ENCOUNTER — Other Ambulatory Visit (HOSPITAL_COMMUNITY): Payer: Self-pay | Admitting: Psychiatry

## 2016-11-07 ENCOUNTER — Other Ambulatory Visit: Payer: Self-pay | Admitting: Physician Assistant

## 2016-11-08 ENCOUNTER — Ambulatory Visit: Payer: 59 | Admitting: Psychology

## 2016-11-08 NOTE — Telephone Encounter (Signed)
Do you want this patient to continue the Ferrex Iron supplement? I got a request from the pharmacy.

## 2016-11-10 ENCOUNTER — Ambulatory Visit (INDEPENDENT_AMBULATORY_CARE_PROVIDER_SITE_OTHER): Payer: 59 | Admitting: Psychology

## 2016-11-10 DIAGNOSIS — F331 Major depressive disorder, recurrent, moderate: Secondary | ICD-10-CM | POA: Diagnosis not present

## 2016-11-15 DIAGNOSIS — N3000 Acute cystitis without hematuria: Secondary | ICD-10-CM | POA: Diagnosis not present

## 2016-11-16 ENCOUNTER — Other Ambulatory Visit (HOSPITAL_COMMUNITY): Payer: Self-pay | Admitting: Psychiatry

## 2016-11-16 ENCOUNTER — Ambulatory Visit: Payer: Medicare Other | Admitting: Internal Medicine

## 2016-11-25 DIAGNOSIS — F330 Major depressive disorder, recurrent, mild: Secondary | ICD-10-CM | POA: Diagnosis not present

## 2016-11-26 ENCOUNTER — Ambulatory Visit: Payer: Self-pay | Admitting: Gynecology

## 2016-12-01 ENCOUNTER — Telehealth: Payer: Self-pay | Admitting: *Deleted

## 2016-12-01 NOTE — Telephone Encounter (Signed)
Patient called back and rescheduled her appt to August 6th at 1:15pm; arrive at 12:45pm

## 2016-12-01 NOTE — Telephone Encounter (Signed)
Attempted to contact the patient regarding her missed appt. Left a message for the patient to call the office back

## 2016-12-06 ENCOUNTER — Ambulatory Visit: Payer: 59 | Admitting: Psychology

## 2016-12-15 ENCOUNTER — Encounter: Payer: Self-pay | Admitting: Internal Medicine

## 2016-12-15 ENCOUNTER — Ambulatory Visit (INDEPENDENT_AMBULATORY_CARE_PROVIDER_SITE_OTHER): Payer: Medicare Other | Admitting: Internal Medicine

## 2016-12-15 DIAGNOSIS — R202 Paresthesia of skin: Secondary | ICD-10-CM | POA: Diagnosis not present

## 2016-12-15 DIAGNOSIS — M79605 Pain in left leg: Secondary | ICD-10-CM | POA: Diagnosis not present

## 2016-12-15 DIAGNOSIS — E559 Vitamin D deficiency, unspecified: Secondary | ICD-10-CM

## 2016-12-15 HISTORY — DX: Pain in left leg: M79.605

## 2016-12-15 HISTORY — DX: Paresthesia of skin: R20.2

## 2016-12-15 MED ORDER — SUMATRIPTAN SUCCINATE 100 MG PO TABS: 100.0000 mg | ORAL_TABLET | ORAL | 5 refills | Status: DC | PRN

## 2016-12-15 NOTE — Assessment & Plan Note (Signed)
IT band is tender D-dimer Stretch

## 2016-12-15 NOTE — Assessment & Plan Note (Signed)
On Vit D 

## 2016-12-15 NOTE — Assessment & Plan Note (Signed)
L lat foot - ?peroneal nerve damage from leg crossing

## 2016-12-15 NOTE — Patient Instructions (Signed)
IT band stretch Do not cross legs Use a pillow between legs

## 2016-12-15 NOTE — Progress Notes (Signed)
Subjective:  Patient ID: [name], female    DOB: [birth year]  Age: 66 y.o. MRN: [id number]  CC: No chief complaint on file.   HPI [name] presents for a post-hosp f/u (UTI, syncope, ?seizure) in 10/2016. EEG was normal  Per d/c:  Syncope Patient had 2 episodes of syncope at home, which were unwitnessed and associated with bladder incontinence. Initial concern was for seizure activity. EEG did not show any epileptiform activity. Patient seen by neurology. No recommendation for any anti-epileptiform medications. CT angiogram of the head and neck did not show any acute finding. Patient was also complaining of chest pain and dyspnea. EKG did not reveal any acute changes. She underwent a VQ scan which did not reveal any pulmonary embolism. Echocardiogram report as above. No concerning findings noted. Patient was seen by cardiology. Telemetry did show 2 brief episodes of SVT. But no recurrence of the same. Patient feels much better this morning. 30 day event monitor recommended by cardiology. No further workup to be done in the hospital. UTI could've also contributed.  Migraine headaches This has been ongoing for 5 days. Patient is on Topamax, tramadol, Neurontin, and Imitrex. Patient was given dexamethasone in the hospital. Neurology recommends follow-up with her outpatient provider for home/ambulatory EEG. Outpatient massage therapy also recommended.  Urinary tract infectionwith Klebsiella Patient was initially started on ceftriaxone. Sensitivities reviewed. Changed over to Keflex.   History of asthma. Stable  History of chronic kidney disease stage III Renal function is stable at baseline. Mild non-anion gap metabolic acidosis was notedand is improving.   History of hypothyroidism.  Continue Synthroid. TSH and [name] is normal.  Normocytic anemia. Drop in hemoglobin noted. This was likely dilutional. No evidence for overt bleeding. Iron 17. Ferritin 64. B12  890.    C/o LLE weakness and tingling in the L lateral foot - digits #4-5   Outpatient Medications Prior to Visit  Medication Sig Dispense Refill  . ALPRAZolam (XANAX) 0.5 MG tablet Take 1 tablet (0.5 mg total) by mouth 2 (two) times daily as needed for anxiety. (Patient taking differently: Take 0.5 mg by mouth 3 (three) times daily as needed for anxiety. ) 60 tablet 0  . baclofen (LIORESAL) 10 MG tablet Take 1 tablet (10 mg total) by mouth daily as needed for muscle spasms. 20 each 0  . busPIRone (BUSPAR) 10 MG tablet Take 1 tablet (10 mg total) by mouth 2 (two) times daily. 180 tablet 2  . Cholecalciferol (D3-1000) 1000 units capsule Take 1,000 Units by mouth daily.    . Cyanocobalamin (VITAMIN B-12) 1000 MCG SUBL Place 1 tablet (1,000 mcg total) under the tongue daily. 100 tablet 3  . dicyclomine (BENTYL) 10 MG capsule Take 1 tab twice daily. (Patient taking differently: Take 10 mg by mouth 2 (two) times daily. Take 1 tab twice daily.) 180 capsule 2  . esomeprazole (NEXIUM) 40 MG capsule Take 1 capsule (40 mg total) by mouth 2 (two) times daily before a meal. 180 capsule 1  . estradiol (ESTRACE) 1 MG tablet Take 1 tablet (1 mg total) by mouth daily. 90 tablet 3  . FERREX 150 150 MG capsule TAKE 1 CAPSULE (150 MG TOTAL) BY MOUTH 2 (TWO) TIMES DAILY. 60 capsule 6  . FLUoxetine (PROZAC) 40 MG capsule Take 1 capsule (40 mg total) by mouth every morning. 90 capsule 2  . levothyroxine (SYNTHROID, LEVOTHROID) 100 MCG tablet Take 1 tablet (100 mcg total) by mouth daily before breakfast. 90 tablet  2  . mirtazapine (REMERON) 30 MG tablet Take 1 tablet (30 mg total) by mouth at bedtime. 90 tablet 2  . Multiple Vitamins-Minerals (MULTIVITAMIN WITH MINERALS) tablet Take 1 tablet by mouth daily.    . ondansetron (ZOFRAN-ODT) 4 MG disintegrating tablet Take 4 mg by mouth every 8 (eight) hours as needed for nausea or vomiting.   0  . progesterone (PROMETRIUM) 100 MG capsule Take 100 mg by mouth at bedtime.     . SUMAtriptan (IMITREX) 6 MG/0.5ML SOLN injection Inject 6 mg into the skin every 2 (two) hours as needed for migraine or headache. Reported on 08/11/2015    . topiramate (TOPAMAX) 50 MG tablet Take 1 tablet (50 mg total) by mouth at bedtime. 90 tablet 2  . traMADol (ULTRAM) 50 MG tablet Take 50 mg by mouth every 6 (six) hours as needed (for pain).     Marland Kitchen trimethoprim (TRIMPEX) 100 MG tablet Take 1 tablet (100 mg total) by mouth daily. RESUME AFTER YOU COMPLETE THE COURSE OF KEFLEX    . fexofenadine (ALLEGRA) 180 MG tablet Take 180 mg by mouth daily as needed for allergies or rhinitis.     . fluticasone (FLONASE) 50 MCG/ACT nasal spray Place 1 spray into both nostrils 2 (two) times daily as needed for allergies or rhinitis.     Marland Kitchen gabapentin (NEURONTIN) 100 MG capsule Take 1 capsule (100 mg total) by mouth 3 (three) times daily. One three times daily 90 capsule 2  . iron polysaccharides (FERREX 150) 150 MG capsule Take 150 mg by mouth 2 (two) times daily.     No facility-administered medications prior to visit.     ROS Review of Systems  Constitutional: Negative for activity change, appetite change, chills, fatigue and unexpected weight change.  HENT: Negative for congestion, mouth sores and sinus pressure.   Eyes: Negative for visual disturbance.  Respiratory: Negative for cough and chest tightness.   Gastrointestinal: Negative for abdominal pain and nausea.  Genitourinary: Negative for difficulty urinating, frequency and vaginal pain.  Musculoskeletal: Negative for back pain and gait problem.  Skin: Negative for pallor and rash.  Neurological: Positive for weakness and headaches. Negative for dizziness, tremors and numbness.  Psychiatric/Behavioral: Negative for confusion, sleep disturbance and suicidal ideas.    Objective:  BP 128/86 (BP Location: Left Arm, Patient Position: Sitting, Cuff Size: Normal)   Pulse 80   Temp 98.5 F (36.9 C) (Oral)   Ht 5\' 3"  (1.6 m)   Wt 139 lb (63 kg)    SpO2 98%   BMI 24.62 kg/m   BP Readings from Last 3 Encounters:  12/15/16 128/86  10/27/16 (!) 146/100  10/16/16 135/82    Wt Readings from Last 3 Encounters:  12/15/16 139 lb (63 kg)  10/27/16 136 lb (61.7 kg)  10/16/16 142 lb 8 oz (64.6 kg)    Physical Exam  Constitutional: She appears well-developed. No distress.  HENT:  Head: Normocephalic.  Right Ear: External ear normal.  Left Ear: External ear normal.  Nose: Nose normal.  Mouth/Throat: Oropharynx is clear and moist.  Eyes: Pupils are equal, round, and reactive to light. Conjunctivae are normal. Right eye exhibits no discharge. Left eye exhibits no discharge.  Neck: Normal range of motion. Neck supple. No JVD present. No tracheal deviation present. No thyromegaly present.  Cardiovascular: Normal rate, regular rhythm and normal heart sounds.   Pulmonary/Chest: No stridor. No respiratory distress. She has no wheezes.  Abdominal: Soft. Bowel sounds are normal. She exhibits no distension  and no mass. There is no tenderness. There is no rebound and no guarding.  Musculoskeletal: She exhibits no edema or tenderness.  Lymphadenopathy:    She has no cervical adenopathy.  Neurological: She displays normal reflexes. No cranial nerve deficit. She exhibits normal muscle tone. Coordination normal.  Skin: No rash noted. No erythema.  Psychiatric: She has a normal mood and affect. Her behavior is normal. Judgment and thought content normal.    Lab Results  Component Value Date   WBC 7.8 10/27/2016   HGB 11.6 (L) 10/27/2016   HCT 35.0 (L) 10/27/2016   PLT 515.0 (H) 10/27/2016   GLUCOSE 102 (H) 10/27/2016   CHOL 249 (H) 05/05/2016   TRIG 213.0 (H) 05/05/2016   HDL 54.20 05/05/2016   LDLDIRECT 161.0 05/05/2016   LDLCALC 96 06/16/2007   ALT 9 10/27/2016   AST 14 10/27/2016   NA 137 10/27/2016   K 4.1 10/27/2016   CL 107 10/27/2016   CREATININE 1.26 (H) 10/27/2016   BUN 17 10/27/2016   CO2 25 10/27/2016   TSH 3.213  10/15/2016   INR 1.00 04/30/2013   HGBA1C 5.5 09/23/2008    Ct Chest W Contrast  Result Date: 10/20/2016 CLINICAL DATA:  Followup vaginal leiomyosarcoma. Previous hysterectomy and chemotherapy. EXAM: CT CHEST, ABDOMEN, AND PELVIS WITH CONTRAST TECHNIQUE: Multidetector CT imaging of the chest, abdomen and pelvis was performed following the standard protocol during bolus administration of intravenous contrast. CONTRAST:  119mL ISOVUE-300 IOPAMIDOL (ISOVUE-300) INJECTION 61% COMPARISON:  09/04/2015 FINDINGS: CT CHEST FINDINGS Cardiovascular: No acute findings. Mediastinum/Lymph Nodes: No masses or pathologically enlarged lymph nodes identified. Lungs/Pleura: No pulmonary infiltrate or mass identified. No effusion present. Stable right upper lobe calcified granuloma. Musculoskeletal: No suspicious bone lesions identified. Anterior cervical spine fusion hardware seen at the cervicothoracic junction. CT ABDOMEN AND PELVIS FINDINGS Hepatobiliary: No masses identified. Prior cholecystectomy noted. No evidence of biliary dilatation. Pancreas:  No mass or inflammatory changes. Spleen:  Within normal limits in size and appearance. Adrenals/Urinary tract: No masses or hydronephrosis. Several small nonobstructing renal calculi again seen bilaterally. Stomach/Bowel: No evidence of obstruction, inflammatory process, or abnormal fluid collections. Vascular/Lymphatic: No pathologically enlarged lymph nodes identified. No abdominal aortic aneurysm. Reproductive: Prior hysterectomy noted. Adnexal regions are unremarkable in appearance. No masses identified. Other:  None. Musculoskeletal:  No suspicious bone lesions identified. IMPRESSION: Stable exam.  No evidence of recurrent or metastatic sarcoma. Bilateral nonobstructing nephrolithiasis. Electronically Signed   By: Earle Gell M.D.   On: 10/20/2016 08:40   Ct Abdomen Pelvis W Contrast  Result Date: 10/20/2016 CLINICAL DATA:  Followup vaginal leiomyosarcoma. Previous  hysterectomy and chemotherapy. EXAM: CT CHEST, ABDOMEN, AND PELVIS WITH CONTRAST TECHNIQUE: Multidetector CT imaging of the chest, abdomen and pelvis was performed following the standard protocol during bolus administration of intravenous contrast. CONTRAST:  156mL ISOVUE-300 IOPAMIDOL (ISOVUE-300) INJECTION 61% COMPARISON:  09/04/2015 FINDINGS: CT CHEST FINDINGS Cardiovascular: No acute findings. Mediastinum/Lymph Nodes: No masses or pathologically enlarged lymph nodes identified. Lungs/Pleura: No pulmonary infiltrate or mass identified. No effusion present. Stable right upper lobe calcified granuloma. Musculoskeletal: No suspicious bone lesions identified. Anterior cervical spine fusion hardware seen at the cervicothoracic junction. CT ABDOMEN AND PELVIS FINDINGS Hepatobiliary: No masses identified. Prior cholecystectomy noted. No evidence of biliary dilatation. Pancreas:  No mass or inflammatory changes. Spleen:  Within normal limits in size and appearance. Adrenals/Urinary tract: No masses or hydronephrosis. Several small nonobstructing renal calculi again seen bilaterally. Stomach/Bowel: No evidence of obstruction, inflammatory process, or abnormal fluid collections. Vascular/Lymphatic:  No pathologically enlarged lymph nodes identified. No abdominal aortic aneurysm. Reproductive: Prior hysterectomy noted. Adnexal regions are unremarkable in appearance. No masses identified. Other:  None. Musculoskeletal:  No suspicious bone lesions identified. IMPRESSION: Stable exam.  No evidence of recurrent or metastatic sarcoma. Bilateral nonobstructing nephrolithiasis. Electronically Signed   By: Earle Gell M.D.   On: 10/20/2016 08:40    Assessment & Plan:   There are no diagnoses linked to this encounter. I have discontinued Ms. Masters Marks's fluticasone, gabapentin, iron polysaccharides, and fexofenadine. I am also having her maintain her SUMAtriptan, esomeprazole, estradiol, multivitamin with minerals,  traMADol, Vitamin B-12, FLUoxetine, levothyroxine, mirtazapine, topiramate, dicyclomine, busPIRone, ALPRAZolam, ondansetron, progesterone, Cholecalciferol, baclofen, trimethoprim, and FERREX 150.  No orders of the defined types were placed in this encounter.    Follow-up: No Follow-up on file.  Walker Kehr, MD

## 2016-12-19 ENCOUNTER — Encounter: Payer: Self-pay | Admitting: Internal Medicine

## 2016-12-20 ENCOUNTER — Ambulatory Visit: Payer: Self-pay | Admitting: Gynecology

## 2016-12-20 ENCOUNTER — Other Ambulatory Visit (HOSPITAL_COMMUNITY): Payer: Self-pay | Admitting: Psychiatry

## 2016-12-20 ENCOUNTER — Telehealth: Payer: Self-pay | Admitting: Gynecologic Oncology

## 2016-12-20 NOTE — Telephone Encounter (Signed)
Returned call to patient.  Left message asking her to please call the office to reschedule her appt.  Per her message, appt for today was cancelled.

## 2016-12-22 ENCOUNTER — Ambulatory Visit: Payer: Self-pay | Admitting: Psychology

## 2016-12-24 ENCOUNTER — Emergency Department (HOSPITAL_COMMUNITY)
Admission: EM | Admit: 2016-12-24 | Discharge: 2016-12-24 | Disposition: A | Discharge status: Home / Self Care | Payer: Medicare Other | Attending: Emergency Medicine | Admitting: Emergency Medicine

## 2016-12-24 ENCOUNTER — Emergency Department (HOSPITAL_COMMUNITY): Payer: Medicare Other

## 2016-12-24 ENCOUNTER — Encounter (HOSPITAL_COMMUNITY): Payer: Self-pay | Admitting: Emergency Medicine

## 2016-12-24 DIAGNOSIS — N183 Chronic kidney disease, stage 3 (moderate): Secondary | ICD-10-CM | POA: Insufficient documentation

## 2016-12-24 DIAGNOSIS — Z79899 Other long term (current) drug therapy: Secondary | ICD-10-CM | POA: Diagnosis not present

## 2016-12-24 DIAGNOSIS — J45909 Unspecified asthma, uncomplicated: Secondary | ICD-10-CM | POA: Insufficient documentation

## 2016-12-24 DIAGNOSIS — E039 Hypothyroidism, unspecified: Secondary | ICD-10-CM | POA: Diagnosis not present

## 2016-12-24 DIAGNOSIS — M5410 Radiculopathy, site unspecified: Secondary | ICD-10-CM | POA: Insufficient documentation

## 2016-12-24 DIAGNOSIS — M79605 Pain in left leg: Secondary | ICD-10-CM | POA: Diagnosis present

## 2016-12-24 DIAGNOSIS — M545 Low back pain: Secondary | ICD-10-CM | POA: Diagnosis not present

## 2016-12-24 MED ORDER — METHYLPREDNISOLONE SODIUM SUCC 125 MG IJ SOLR
80.0000 mg | Freq: Once | INTRAMUSCULAR | Status: AC
  Administered 2016-12-24: 80 mg via INTRAMUSCULAR
  Filled 2016-12-24: qty 2

## 2016-12-24 MED ORDER — ONDANSETRON 4 MG PO TBDP
8.0000 mg | ORAL_TABLET | Freq: Once | ORAL | Status: AC
  Administered 2016-12-24: 8 mg via ORAL
  Filled 2016-12-24: qty 2

## 2016-12-24 MED ORDER — PREDNISONE 10 MG (21) PO TBPK: ORAL_TABLET | ORAL | 0 refills | Status: DC

## 2016-12-24 MED ORDER — OXYCODONE-ACETAMINOPHEN 5-325 MG PO TABS
1.0000 | ORAL_TABLET | Freq: Once | ORAL | Status: AC
  Administered 2016-12-24: 1 via ORAL
  Filled 2016-12-24: qty 1

## 2016-12-24 MED ORDER — GABAPENTIN 300 MG PO CAPS
300.0000 mg | ORAL_CAPSULE | Freq: Once | ORAL | Status: AC
  Administered 2016-12-24: 300 mg via ORAL
  Filled 2016-12-24: qty 1

## 2016-12-24 MED ORDER — GABAPENTIN 300 MG PO CAPS: 300.0000 mg | ORAL_CAPSULE | Freq: Two times a day (BID) | ORAL | 0 refills | Status: DC

## 2016-12-24 NOTE — ED Triage Notes (Signed)
Pt started 2 weeks ago with left posterior leg pain to foot/left lateral foot numbness. Saw Dr. Alain Marion 2 weeks ago, he said it was "a tight IT band".

## 2016-12-24 NOTE — Discharge Instructions (Signed)
You have signs of a herniated disc likely causing your symptoms. Take the Neurontin, as prescribed, for nerve related pain. Take the prednisone, as prescribed, to reduce inflammation. Follow-up with the neurosurgeon in the office. Call the number provided to set up an appointment.

## 2016-12-24 NOTE — ED Provider Notes (Signed)
[facility] DEPT Provider Note    By signing my name below, I, Bea Graff, attest that this documentation has been prepared under the direction and in the presence of Haston Casebolt, PA-C. Electronically Signed: Bea Graff, ED Scribe. 12/24/16. 2:22 PM.    History   Chief Complaint Chief Complaint  Patient presents with  . Leg Pain   The history is provided by the patient and medical records. No language interpreter was used.    [name] is a 66 y.o. female who presents to the Emergency Department complaining of posterior left leg pain that began two weeks ago. She reports associated numbness of the toes of the left foot and some weakness of the left leg and foot. She was seen by Dr. Alain Marion 9 days ago and was diagnosed with tightness of the IT band of the LLE and was using conservative treatment with moderate relief. She states upon going to work today the pain began worsening and is experiencing stabbing, burning pain in the posterior lower left leg. She has taken Aleve for pain with minimal relief. Moving the LLE increases the pain. She denies alleviating factors. She denies any trauma, injury or fall. She denies bruising, wounds, fever, chills, swelling of the leg, bowel or bladder incontinence. She reports h/o cervical spine fusion by Dr. Joya Salm several years ago.   Past Medical History:  Diagnosis Date  . Allergic rhinitis, cause unspecified   . Anxiety   . Asthma    chronic cough  . Bipolar I disorder, most recent episode (or current) depressed, severe, without mention of psychotic behavior   . Blood transfusion without reported diagnosis   . Deep phlebothrombosis, postpartum, unspecified as to episode of care(671.40)   . Depression   . Fibromyalgia   . Gastritis   . GERD (gastroesophageal reflux disease)   . Hyperlipidemia   . IBS (irritable bowel syndrome)   . 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 Migraine headache   . Other  and unspecified hyperlipidemia   . Parathyroid adenoma 2014  . Status post chemotherapy 2014    Patient Active Problem List   Diagnosis Date Noted  . Leg pain, lateral, left 12/15/2016  . Paresthesia 12/15/2016  . Syncope 10/13/2016  . Migraine headache 10/13/2016  . Asthma 10/13/2016  . Anxiety 10/13/2016  . Hyperlipidemia 10/13/2016  . UTI (urinary tract infection) 10/13/2016  . CKD (chronic kidney disease) stage 3, GFR 30-59 ml/min 10/13/2016  . Chronic depression 10/13/2016  . Hypothyroidism 10/13/2016  . Acute upper respiratory infection 05/05/2016  . Chronic urinary tract infection 07/22/2015  . Malaise and fatigue 04/16/2015  . Severe episode of recurrent major depressive disorder, without psychotic features ([place]) 12/18/2014  . Post concussion syndrome 10/28/2014  . 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 Depression 10/25/2014  . Major depressive disorder, recurrent, severe without psychotic features ([place])   . 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 Concussion w/o coma 09/18/2014  . Irritable bowel syndrome with diarrhea 09/12/2014  . 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 Cold intolerance 03/26/2014  . Cough variant asthma  vs UACS/ irritable larynx  03/26/2014  . Abdominal pain, generalized 03/26/2014  . Positive skin test 03/26/2014  . Adjustment disorder with anxiety 02/07/2014  . Cervical pain 12/05/2013  . Fibromyalgia 12/05/2013  . Cephalalgia 12/05/2013  . Adult hypothyroidism 12/05/2013  . Adaptive colitis 12/05/2013  . Menopause 12/05/2013  . Mild cognitive disorder 12/05/2013  .  Feeling bilious 12/05/2013  . Leiomyosarcoma ([place]) 09/10/2013  . Lung mass 09/10/2013  . Lung nodule, solitary 09/10/2013  . Abnormal chest x-ray   . Unspecified hypothyroidism 04/25/2013  . Vitamin D deficiency 04/25/2013  . 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 Female genuine stress incontinence  04/12/2012  . Elevated blood pressure reading without diagnosis of hypertension 10/30/2010  . Healthcare maintenance 10/30/2010  . MUSCLE PAIN 07/03/2008  . BIPLR I MOST RECENT EPIS DPRS SEV NO PSYCHOT [facility] 02/14/2008  . DEEP PHLEBOTHROMBOSIS PP UNSPEC AS EPIS CARE 02/14/2008  . Bipolar affective disorder, current episode depressed ([place]) 02/14/2008  . Deep phlebothrombosis, postpartum 02/14/2008  . HYPERLIPIDEMIA 03/04/2007  . ALLERGIC RHINITIS 03/04/2007  . GERD 03/04/2007  . IRRITABLE BOWEL SYNDROME, HX OF 03/04/2007    Past Surgical History:  Procedure Laterality Date  . CESAREAN SECTION  1986  . CHOLECYSTECTOMY N/A 05/03/2013   Procedure: LAPAROSCOPIC CHOLECYSTECTOMY WITH INTRAOPERATIVE CHOLANGIOGRAM;  Surgeon: Odis Hollingshead, MD;  Location: [facility];  Service: General;  Laterality: N/A;  . COLONOSCOPY    . laprotomy  04/2012   for cancer  . NECK SURGERY  2006   C spine fusion  . PUBOVAGINAL SLING    . VAGINAL HYSTERECTOMY      OB History    No data available       Home Medications    Prior to Admission medications   Medication Sig Start Date End Date Taking? Authorizing Provider  ALPRAZolam Duanne Moron) 0.5 MG tablet Take 1 tablet (0.5 mg total) by mouth 2 (two) times daily as needed for anxiety. Patient taking differently: Take 0.5 mg by mouth 3 (three) times daily as needed for anxiety.  08/11/16   Plotnikov, [name], MD  baclofen (LIORESAL) 10 MG tablet Take 1 tablet (10 mg total) by mouth daily as needed for muscle spasms. 10/16/16   Bonnielee Haff, MD  busPIRone (BUSPAR) 10 MG tablet Take 1 tablet (10 mg total) by mouth 2 (two) times daily. 08/06/16   Plotnikov, [name], MD  Cholecalciferol (D3-1000) 1000 units capsule Take 1,000 Units by mouth daily.    [provider]  Cyanocobalamin (VITAMIN B-12) 1000 MCG SUBL Place 1 tablet (1,000 mcg total) under the tongue daily. 05/05/16   Plotnikov, [name], MD  dicyclomine (BENTYL) 10 MG capsule Take 1 tab twice  daily. Patient taking differently: Take 10 mg by mouth 2 (two) times daily. Take 1 tab twice daily. 08/06/16   Plotnikov, [name], MD  esomeprazole (NEXIUM) 40 MG capsule Take 1 capsule (40 mg total) by mouth 2 (two) times daily before a meal. 10/28/14   Niel Hummer, NP  estradiol (ESTRACE) 1 MG tablet Take 1 tablet (1 mg total) by mouth daily. 10/28/14   Niel Hummer, NP  FERREX 150 150 MG capsule TAKE 1 CAPSULE (150 MG TOTAL) BY MOUTH 2 (TWO) TIMES DAILY. 11/08/16   Mauri Pole, MD  FLUoxetine (PROZAC) 40 MG capsule Take 1 capsule (40 mg total) by mouth every morning. 07/01/16   Plotnikov, [name], MD  levothyroxine (SYNTHROID, LEVOTHROID) 100 MCG tablet Take 1 tablet (100 mcg total) by mouth daily before breakfast. 07/01/16   Plotnikov, [name], MD  mirtazapine (REMERON) 30 MG tablet Take 1 tablet (30 mg total) by mouth at bedtime. 07/01/16   Plotnikov, [name], MD  Multiple Vitamins-Minerals (MULTIVITAMIN WITH MINERALS) tablet Take 1 tablet by mouth daily. 10/28/14   Niel Hummer, NP  ondansetron (ZOFRAN-ODT) 4 MG disintegrating tablet Take  4 mg by mouth every 8 (eight) hours as needed for nausea or vomiting.  07/14/16   [provider]  progesterone (PROMETRIUM) 100 MG capsule Take 100 mg by mouth at bedtime.    [provider]  SUMAtriptan (IMITREX) 100 MG tablet Take 1 tablet (100 mg total) by mouth every 2 (two) hours as needed for migraine. May repeat in 2 hours if headache persists or recurs. 12/15/16   Plotnikov, [name], MD  SUMAtriptan (IMITREX) 6 MG/0.5ML SOLN injection Inject 6 mg into the skin every 2 (two) hours as needed for migraine or headache. Reported on 08/11/2015    [provider]  topiramate (TOPAMAX) 50 MG tablet Take 1 tablet (50 mg total) by mouth at bedtime. 07/01/16   Plotnikov, [name], MD  traMADol (ULTRAM) 50 MG tablet Take 50 mg by mouth every 6 (six) hours as needed (for pain).     [provider]  trimethoprim  (TRIMPEX) 100 MG tablet Take 1 tablet (100 mg total) by mouth daily. RESUME AFTER YOU COMPLETE THE COURSE OF Polly Cobia 10/16/16   Bonnielee Haff, MD    Family History Family History  Problem Relation Age of Onset  . Coronary artery disease Father   . Heart disease Father   . Hypertension Father   . Irritable bowel syndrome Father   . Arthritis Mother   . Hypertension Mother   . Migraines Mother   . Heart disease Mother   . Diabetes Maternal Grandmother   . Irritable bowel syndrome Sister   . Irritable bowel syndrome Brother   . Colon cancer Neg Hx   . Esophageal cancer Neg Hx   . Rectal cancer Neg Hx   . Stomach cancer Neg Hx     Social History Social History  Substance Use Topics  . Smoking status: Never Smoker  . Smokeless tobacco: Never Used  . Alcohol use 1.2 - 1.8 oz/week    2 - 3 Standard drinks or equivalent per week     Comment: rarely     Allergies   Buprenorphine hcl; Morphine and related; Other; Codeine; Dust mite extract; and Molds & smuts   Review of Systems Review of Systems  Constitutional: Negative for chills and fever.  Musculoskeletal: Positive for arthralgias and myalgias. Negative for joint swelling.  Skin: Negative for color change and wound.  Neurological: Positive for numbness.     Physical Exam Updated Vital Signs BP (!) 158/80 (BP Location: Right Arm)   Pulse 69   Temp 98.1 F (36.7 C) (Oral)   Resp 16   SpO2 98%   Physical Exam  Constitutional: She is oriented to person, place, and time. She appears well-developed and well-nourished.  HENT:  Head: Normocephalic and atraumatic.  Neck: Normal range of motion.  Cardiovascular: Normal rate.   Pulmonary/Chest: Effort normal.  Musculoskeletal: Normal range of motion.  No midline or perispinal tenderness over lumbar spine. Pain with left straight leg raise. No pain with internal or external rotation at the hip.   Neurological: She is alert and oriented to person, place, and time.    Decreased sensation over left lateral thigh, lateral calf, lateral foot. Weakness with foot dorsiflexion and knee extension. DP pulses intact and equal bilaterally 2+ bilateral patellar reflex and ankle reflex.   Skin: Skin is warm and dry.  Psychiatric: She has a normal mood and affect. Her behavior is normal.  Nursing note and vitals reviewed.    ED Treatments / Results  DIAGNOSTIC STUDIES: Oxygen Saturation is  98% on RA, normal by my interpretation.   COORDINATION OF CARE: 2:16 PM- Will speak with attending physician about appropriate course of treatment. Pt verbalizes understanding and agrees to plan.  2:22 PM- Will order MRI.   Medications - No data to display  Labs (all labs ordered are listed, but only abnormal results are displayed) Labs Reviewed - No data to display  EKG  EKG Interpretation None       Radiology No results found.  Procedures Procedures (including critical care time)  Medications Ordered in ED Medications - No data to display   Initial Impression / Assessment and Plan / ED Course  I have reviewed the triage vital signs and the nursing notes.  Pertinent labs & imaging results that were available during my care of the patient were reviewed by me and considered in my medical decision making (see chart for details).  Clinical Course as of Dec 29 111  Fri Dec 24, 2016  1712 Took patient care handoff report from [place], [place]. Plan: Speak with neurosurgery about findings and MRI results.   [SJ]  1806 Spoke with Dr. Ellene Route, neurosurgeon. States he has reviewed the patient's imaging and recommends a 12 day prednisone taper along with Neurontin 300 mg twice a day for pain. He does not recommend emergent surgery.  [SJ]    Clinical Course User Index [SJ] Joy, Shawn C, PA-C    Patient with left leg worsening numbness, weakness, pain. On exam decreased sensation to the lateral foot and leg, decreased strength with dorsiflexion of the foot  and knee extension.  pulses intact. No leg swelling, doubt DVT. No bony tenderness. Discussed with Dr. Tyrone Nine. Will get MR given new weakness and numbness.   Pt signed out at shift change to PA Joy pending consult with neurosurgery  Final Clinical Impressions(s) / ED Diagnoses   Final diagnoses:  None    New Prescriptions New Prescriptions   No medications on file    I personally performed the services described in this documentation, which was scribed in my presence. The recorded information has been reviewed and is accurate.      [name], PA-C 12/28/16 [place], [place], DO 12/31/16 [id number]

## 2016-12-24 NOTE — ED Provider Notes (Signed)
[name] is a 66 y.o. female, with a history of fibromyalgia, anxiety, and cervical spinal fusion, presenting to the ED with leg pain and numbness over the last 2 weeks, worse today. Patient does complain of difficulty walking, but clarifies that this is due to pain rather than lack of physical ability.    HPI from [name], PA-C: "[name] is a 66 y.o. female who presents to the Emergency Department complaining of posterior left leg pain that began two weeks ago. She reports associated numbness of the toes of the left foot and some weakness of the left leg and foot. She was seen by Dr. Alain Marion 9 days ago and was diagnosed with tightness of the IT band of the LLE and was using conservative treatment with moderate relief. She states upon going to work today the pain began worsening and is experiencing stabbing, burning pain in the posterior lower left leg. She has taken Aleve for pain with minimal relief. Moving the LLE increases the pain. She denies alleviating factors. She denies any trauma, injury or fall. She denies bruising, wounds, fever, chills, swelling of the leg, bowel or bladder incontinence. She reports h/o cervical spine fusion by Dr. Joya Salm several years ago."  Past Medical History:  Diagnosis Date  . Allergic rhinitis, cause unspecified   . Anxiety   . Asthma    chronic cough  . Bipolar I disorder, most recent episode (or current) depressed, severe, without mention of psychotic behavior   . Blood transfusion without reported diagnosis   . Deep phlebothrombosis, postpartum, unspecified as to episode of care(671.40)   . Depression   . Fibromyalgia   . Gastritis   . GERD (gastroesophageal reflux disease)   . Hyperlipidemia   . IBS (irritable bowel syndrome)   . 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 Migraine headache   . Other and unspecified hyperlipidemia   . Parathyroid adenoma 2014  . Status post chemotherapy 2014    Physical  Exam  BP (!) 158/80 (BP Location: Right Arm)   Pulse 69   Temp 98.1 F (36.7 C) (Oral)   Resp 16   SpO2 98%   Physical Exam  Constitutional: She appears well-developed and well-nourished. No distress.  HENT:  Head: Normocephalic and atraumatic.  Eyes: Conjunctivae are normal.  Neck: Neck supple.  Cardiovascular: Normal rate, regular rhythm and intact distal pulses.   Pulmonary/Chest: Effort normal. No respiratory distress.  Abdominal: There is no guarding.  Musculoskeletal: She exhibits no edema.  Neurological: She is alert.  Flexion and extension in the left hip 5/5 Flexion and extension at the left knee 5/5 Dorsiflexion and plantarflexion on the left 4/5  Decreased sensation along the lateral aspect of the left foot into the posterior left ankle as well as the fourth and fifth digits.  Skin: Skin is warm and dry. Capillary refill takes less than 2 seconds. She is not diaphoretic.  Psychiatric: She has a normal mood and affect. Her behavior is normal.  Nursing note and vitals reviewed.     ED Course  Procedures  MDM   Clinical Course as of Dec 24 1817  Fri Dec 24, 2016  1712 Took patient care handoff report from [place], [place]. Plan: Speak with neurosurgery about findings and MRI results.   [SJ]  1806 Spoke with Dr. Ellene Route, neurosurgeon. States he has reviewed the patient's imaging and recommends a 12 day prednisone taper along with Neurontin 300 mg twice a day  for pain. He does not recommend emergent surgery.  [SJ]    Clinical Course User Index [SJ] Ashyia Schraeder C, PA-C    Patient presents with left leg pain and numbness in the S1 distribution. Suspect radiculopathy. Confirmed on MRI. Patient able to ambulate following symptom management. Neurosurgery follow-up outpatient. The patient was given instructions for home care as well as return precautions. Patient voices understanding of these instructions, accepts the plan, and is comfortable with  discharge.  Vitals:   12/24/16 1403 12/24/16 1854  BP: (!) 158/80 125/82  Pulse: 69 (!) 57  Resp: 16 16  Temp: 98.1 F (36.7 C) 97.7 F (36.5 C)  TempSrc: Oral Oral  SpO2: 98% 99%      Layla Maw 12/24/16 1953    Nat Christen, MD 12/25/16 2133

## 2016-12-24 NOTE — ED Notes (Signed)
Pt stable, ambulatory, states understanding of discharge instructions 

## 2016-12-27 ENCOUNTER — Ambulatory Visit: Payer: 59 | Admitting: Psychology

## 2016-12-27 ENCOUNTER — Telehealth: Payer: Self-pay | Admitting: *Deleted

## 2016-12-27 NOTE — Telephone Encounter (Signed)
Returned the patient's call and moved her appt from August 31st to October 8th.

## 2016-12-28 ENCOUNTER — Telehealth: Payer: Self-pay | Admitting: Emergency Medicine

## 2016-12-28 NOTE — Telephone Encounter (Signed)
Spoke with patient she is declining services at this time.  Pt states she has not had anymore symptoms and doesn't feel she needs the monitor. Please see the notes from the attempts of trying to get pt scheduled.   12/28/2016 spoke with pt she does not want services at this time. I told her i would let ordering physician know. stpegram  11/18/2016 LMOM for pt to call and schedule monitor appt. stpegram 11/01/2016 LMOM for pt to call and schedule Monitor appt. stpegram 10/18/2016 LMOM for pt to call and schedule monitor appt. stpegram

## 2016-12-29 ENCOUNTER — Other Ambulatory Visit (INDEPENDENT_AMBULATORY_CARE_PROVIDER_SITE_OTHER): Payer: 59

## 2016-12-29 DIAGNOSIS — R202 Paresthesia of skin: Secondary | ICD-10-CM | POA: Diagnosis not present

## 2016-12-29 DIAGNOSIS — M79605 Pain in left leg: Secondary | ICD-10-CM | POA: Diagnosis not present

## 2016-12-29 DIAGNOSIS — E559 Vitamin D deficiency, unspecified: Secondary | ICD-10-CM | POA: Diagnosis not present

## 2016-12-29 DIAGNOSIS — R030 Elevated blood-pressure reading, without diagnosis of hypertension: Secondary | ICD-10-CM | POA: Diagnosis not present

## 2016-12-29 DIAGNOSIS — M5126 Other intervertebral disc displacement, lumbar region: Secondary | ICD-10-CM | POA: Diagnosis not present

## 2016-12-29 LAB — BASIC METABOLIC PANEL
BUN: 29 mg/dL — ABNORMAL HIGH (ref 6–23)
CO2: 23 mEq/L (ref 19–32)
Calcium: 9.8 mg/dL (ref 8.4–10.5)
Chloride: 105 mEq/L (ref 96–112)
Creatinine, Ser: 0.96 mg/dL (ref 0.40–1.20)
GFR: 61.62 mL/min (ref >60.00)
Glucose, Bld: 121 mg/dL — ABNORMAL HIGH (ref 70–99)
Potassium: 4.3 mEq/L (ref 3.5–5.1)
Sodium: 135 mEq/L (ref 135–145)

## 2016-12-29 LAB — URINALYSIS, ROUTINE W REFLEX MICROSCOPIC
Bilirubin Urine: NEGATIVE
Ketones, ur: NEGATIVE
Leukocytes, UA: NEGATIVE
Nitrite: POSITIVE — AB
Specific Gravity, Urine: 1.015 (ref 1.000–1.030)
Total Protein, Urine: NEGATIVE
Urine Glucose: NEGATIVE
Urobilinogen, UA: 0.2 (ref 0.0–1.0)
pH: 6 (ref 5.0–8.0)

## 2016-12-29 LAB — HEPATIC FUNCTION PANEL
ALT: 11 U/L (ref 0–35)
AST: 15 U/L (ref 0–37)
Albumin: 3.8 g/dL (ref 3.5–5.2)
Alkaline Phosphatase: 88 U/L (ref 39–117)
Bilirubin, Direct: 0.1 mg/dL (ref 0.0–0.3)
Total Bilirubin: 0.3 mg/dL (ref 0.2–1.2)
Total Protein: 6 g/dL (ref 6.0–8.3)

## 2016-12-29 LAB — VITAMIN D 25 HYDROXY (VIT D DEFICIENCY, FRACTURES): VITD: 69.02 ng/mL (ref 30.00–100.00)

## 2016-12-29 LAB — VITAMIN B12: Vitamin B-12: >1500 pg/mL — ABNORMAL HIGH (ref 211–911)

## 2016-12-29 LAB — SEDIMENTATION RATE: Sed Rate: 8 mm/hr (ref 0–30)

## 2016-12-29 LAB — TSH: TSH: 0.24 u[IU]/mL — ABNORMAL LOW (ref 0.35–4.50)

## 2016-12-30 ENCOUNTER — Other Ambulatory Visit: Payer: Self-pay | Admitting: Internal Medicine

## 2016-12-30 LAB — D-DIMER, QUANTITATIVE: D-Dimer, Quant: 0.34 mcg/mL FEU (ref <0.50)

## 2016-12-30 MED ORDER — CEFUROXIME AXETIL 250 MG PO TABS: 250.0000 mg | ORAL_TABLET | Freq: Two times a day (BID) | ORAL | 0 refills | Status: DC

## 2016-12-31 ENCOUNTER — Ambulatory Visit (INDEPENDENT_AMBULATORY_CARE_PROVIDER_SITE_OTHER): Payer: 59 | Admitting: Psychology

## 2016-12-31 DIAGNOSIS — F331 Major depressive disorder, recurrent, moderate: Secondary | ICD-10-CM | POA: Diagnosis not present

## 2017-01-04 DIAGNOSIS — M5127 Other intervertebral disc displacement, lumbosacral region: Secondary | ICD-10-CM | POA: Diagnosis not present

## 2017-01-06 ENCOUNTER — Ambulatory Visit: Payer: Self-pay | Admitting: Internal Medicine

## 2017-01-06 DIAGNOSIS — K219 Gastro-esophageal reflux disease without esophagitis: Secondary | ICD-10-CM | POA: Diagnosis not present

## 2017-01-06 DIAGNOSIS — N390 Urinary tract infection, site not specified: Secondary | ICD-10-CM | POA: Diagnosis not present

## 2017-01-07 ENCOUNTER — Ambulatory Visit: Payer: Self-pay | Admitting: Internal Medicine

## 2017-01-14 ENCOUNTER — Ambulatory Visit: Payer: Self-pay | Admitting: Gynecology

## 2017-02-05 DIAGNOSIS — R300 Dysuria: Secondary | ICD-10-CM | POA: Diagnosis not present

## 2017-02-21 ENCOUNTER — Ambulatory Visit: Payer: Medicare Other | Attending: Gynecology | Admitting: Gynecology

## 2017-02-21 ENCOUNTER — Encounter: Payer: Self-pay | Admitting: Gynecology

## 2017-02-21 VITALS — BP 140/90 | HR 82 | Temp 98.5°F | Resp 20 | Ht 64.0 in | Wt 143.0 lb

## 2017-02-21 DIAGNOSIS — Z888 Allergy status to other drugs, medicaments and biological substances status: Secondary | ICD-10-CM | POA: Diagnosis not present

## 2017-02-21 DIAGNOSIS — Z79899 Other long term (current) drug therapy: Secondary | ICD-10-CM | POA: Diagnosis not present

## 2017-02-21 DIAGNOSIS — Z8544 Personal history of malignant neoplasm of other female genital organs: Secondary | ICD-10-CM | POA: Diagnosis not present

## 2017-02-21 DIAGNOSIS — Z8249 Family history of ischemic heart disease and other diseases of the circulatory system: Secondary | ICD-10-CM | POA: Insufficient documentation

## 2017-02-21 DIAGNOSIS — K219 Gastro-esophageal reflux disease without esophagitis: Secondary | ICD-10-CM | POA: Insufficient documentation

## 2017-02-21 DIAGNOSIS — Z9049 Acquired absence of other specified parts of digestive tract: Secondary | ICD-10-CM | POA: Diagnosis not present

## 2017-02-21 DIAGNOSIS — Z808 Family history of malignant neoplasm of other organs or systems: Secondary | ICD-10-CM | POA: Diagnosis not present

## 2017-02-21 DIAGNOSIS — Z90710 Acquired absence of both cervix and uterus: Secondary | ICD-10-CM | POA: Diagnosis not present

## 2017-02-21 DIAGNOSIS — Z833 Family history of diabetes mellitus: Secondary | ICD-10-CM | POA: Insufficient documentation

## 2017-02-21 DIAGNOSIS — N393 Stress incontinence (female) (male): Secondary | ICD-10-CM | POA: Diagnosis not present

## 2017-02-21 DIAGNOSIS — C499 Malignant neoplasm of connective and soft tissue, unspecified: Secondary | ICD-10-CM

## 2017-02-21 DIAGNOSIS — F419 Anxiety disorder, unspecified: Secondary | ICD-10-CM | POA: Diagnosis not present

## 2017-02-21 DIAGNOSIS — Z87440 Personal history of urinary (tract) infections: Secondary | ICD-10-CM | POA: Insufficient documentation

## 2017-02-21 DIAGNOSIS — Z9889 Other specified postprocedural states: Secondary | ICD-10-CM | POA: Insufficient documentation

## 2017-02-21 DIAGNOSIS — Z8542 Personal history of malignant neoplasm of other parts of uterus: Secondary | ICD-10-CM | POA: Insufficient documentation

## 2017-02-21 DIAGNOSIS — F329 Major depressive disorder, single episode, unspecified: Secondary | ICD-10-CM | POA: Diagnosis not present

## 2017-02-21 DIAGNOSIS — Z79891 Long term (current) use of opiate analgesic: Secondary | ICD-10-CM | POA: Insufficient documentation

## 2017-02-21 DIAGNOSIS — Z9221 Personal history of antineoplastic chemotherapy: Secondary | ICD-10-CM

## 2017-02-21 NOTE — Patient Instructions (Signed)
Return to see me in one year.

## 2017-02-21 NOTE — Progress Notes (Signed)
Consult Note: Gyn-Onc   [name] Marks 66 y.o. female  Chief Complaint  Patient presents with  . 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  Plan:  Now with 5 years of negative follow-up we will see the patient again in 1 year. We'll discontinue doing CT scans.  Interval history: Patient returns today for continuing follow-up. Over the past year she's had several medical episodes with urosepsis and associated syncope as well as urinary tract surgery for stress incontinence. She had a CT scan in June 2018 showing no evidence of metastatic or recurrent leiomyosarcoma. Today she reports she feels well. 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 She denies any GI GU or pelvic symptoms.   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 The patient's gynecologic history dates back to 2005 when she underwent a LAVH BSO by Dr. Molli Posey for symptomatic uterine fibroids. The patient then presented in 2013 initially with the vaginal discharge and then heavy bleeding. 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 She underwent exploratory laparotomy with resection of the tumor. Pathology report shows negative margins although one margin was only 1 mm. There were greater than 10 mitoses per 10 high-powered fields. The patient received Gemzar and Taxotere as an adjunct postoperatively. She's been followed in the [facility] with CT or PET scans every 6 months.  Today the patient is entirely asymptomatic. She specifically denies any pelvic pain pressure vaginal bleeding or discharge. She has had difficulties with irritable bowel which were probably aggravated by the stress of a divorce. The patient's been under the care of Dr. [name].  Patient does have a history of recurrent urinary tract  infections and some stress incontinence. She is under the care of Dr. Alona Bene for these urological symptoms and problems.  Obstetrical history gravida 3 para 2 with 1 cesarean section.  Review of Systems:10 point review of systems is negative except as noted in interval history.   Vitals: Blood pressure 140/90, pulse 82, temperature 98.5 F (36.9 C), temperature source Oral, resp. rate 20, height 5\' 4"  (1.626 m), weight 143 lb (64.9 kg), SpO2 100 %.  Physical Exam: General : The patient is a healthy woman in no acute distress.  HEENT: normocephalic, extraoccular movements normal; neck is supple without thyromegally  Lynphnodes: Supraclavicular and inguinal nodes not enlarged  Abdomen: Soft, non-tender, no ascites, no organomegally, no masses, no hernias  Pelvic:  EGBUS: Normal female  Vagina: Normal, no lesions  Urethra and Bladder: Normal, non-tender  Cervix: Surgically absent  Uterus: Surgically absent  Bi-manual examination: Non-tender; no adenxal masses or nodularity  Rectal: normal sphincter tone, no masses, no blood  Lower extremities: No edema or varicosities. Normal range of motion      Allergies  Allergen Reactions  . Buprenorphine Hcl Other (See Comments)    Severe headache, confusion  . Morphine And Related Other (See Comments)    Severe headache  . Other     Trees & Grass  . Codeine Other (See Comments)    Disoriented  . Dust Mite Extract Cough  . Molds & Smuts Cough    Past Medical History:  Diagnosis Date  . Allergic rhinitis, cause unspecified   . Anxiety   . Asthma    chronic cough  .  Bipolar I disorder, most recent episode (or current) depressed, severe, without mention of psychotic behavior   . Blood transfusion without reported diagnosis   . Deep phlebothrombosis, postpartum, unspecified as to episode of care(671.40)   . Depression   . Fibromyalgia   . Gastritis   . GERD (gastroesophageal reflux disease)   . Hyperlipidemia   . IBS  (irritable bowel syndrome)   . 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 Migraine headache   . Other and unspecified hyperlipidemia   . Parathyroid adenoma 2014  . Status post chemotherapy 2014    Past Surgical History:  Procedure Laterality Date  . BLADDER SURGERY  06/2016   [facility], piece of mesh with necrotic tissue removed  . CESAREAN SECTION  1986  . CHOLECYSTECTOMY N/A 05/03/2013   Procedure: LAPAROSCOPIC CHOLECYSTECTOMY WITH INTRAOPERATIVE CHOLANGIOGRAM;  Surgeon: Odis Hollingshead, MD;  Location: [facility];  Service: General;  Laterality: N/A;  . COLONOSCOPY    . laprotomy  04/2012   for cancer  . NECK SURGERY  2006   C spine fusion  . PUBOVAGINAL SLING    . VAGINAL HYSTERECTOMY      Current Outpatient Prescriptions  Medication Sig Dispense Refill  . ALPRAZolam (XANAX) 0.5 MG tablet Take 1 tablet (0.5 mg total) by mouth 2 (two) times daily as needed for anxiety. (Patient taking differently: Take 0.5 mg by mouth 3 (three) times daily as needed for anxiety. ) 60 tablet 0  . baclofen (LIORESAL) 10 MG tablet Take 1 tablet (10 mg total) by mouth daily as needed for muscle spasms. 20 each 0  . busPIRone (BUSPAR) 10 MG tablet Take 1 tablet (10 mg total) by mouth 2 (two) times daily. 180 tablet 2  . Cholecalciferol (D3-1000) 1000 units capsule Take 1,000 Units by mouth daily.    . Cyanocobalamin (VITAMIN B-12) 1000 MCG SUBL Place 1 tablet (1,000 mcg total) under the tongue daily. 100 tablet 3  . dicyclomine (BENTYL) 10 MG capsule Take 1 tab twice daily. (Patient taking differently: Take 10 mg by mouth 2 (two) times daily. Take 1 tab twice daily.) 180 capsule 2  . esomeprazole (NEXIUM) 40 MG capsule Take 1 capsule (40 mg total) by mouth 2 (two) times daily before a meal. 180 capsule 1  . estradiol (ESTRACE) 1 MG tablet Take 1 tablet (1 mg total) by mouth daily. 90 tablet 3  . FERREX 150 150 MG capsule TAKE 1 CAPSULE (150 MG TOTAL) BY MOUTH 2 (TWO) TIMES DAILY. 60 capsule 6   . FLUoxetine (PROZAC) 40 MG capsule Take 1 capsule (40 mg total) by mouth every morning. 90 capsule 2  . levothyroxine (SYNTHROID, LEVOTHROID) 100 MCG tablet Take 1 tablet (100 mcg total) by mouth daily before breakfast. 90 tablet 2  . mirtazapine (REMERON) 30 MG tablet Take 1 tablet (30 mg total) by mouth at bedtime. 90 tablet 2  . Multiple Vitamins-Minerals (MULTIVITAMIN WITH MINERALS) tablet Take 1 tablet by mouth daily.    . ondansetron (ZOFRAN-ODT) 4 MG disintegrating tablet Take 4 mg by mouth every 8 (eight) hours as needed for nausea or vomiting.   0  . predniSONE (STERAPRED UNI-PAK 21 TAB) 10 MG (21) TBPK tablet Take 6 tabs by mouth daily  for 2 days, then 5 tabs for 2 days, then 4 tabs for 2 days, then 3 tabs for 2 days, 2 tabs for 2 days, then 1 tab by mouth daily for 2 days 42 tablet 0  . progesterone (PROMETRIUM)  100 MG capsule Take 100 mg by mouth at bedtime.    . SUMAtriptan (IMITREX) 100 MG tablet Take 1 tablet (100 mg total) by mouth every 2 (two) hours as needed for migraine. May repeat in 2 hours if headache persists or recurs. 12 tablet 5  . SUMAtriptan (IMITREX) 6 MG/0.5ML SOLN injection Inject 6 mg into the skin every 2 (two) hours as needed for migraine or headache. Reported on 08/11/2015    . traMADol (ULTRAM) 50 MG tablet Take 50 mg by mouth every 6 (six) hours as needed (for pain).     Marland Kitchen trimethoprim (TRIMPEX) 100 MG tablet Take 1 tablet (100 mg total) by mouth daily. RESUME AFTER YOU COMPLETE THE COURSE OF KEFLEX     No current facility-administered medications for this visit.     Social History   Social History  . Marital status: Divorced    Spouse name: N/A  . Number of children: 2  . Years of education: 85   Occupational History  . 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 Smoking status: Never Smoker  . Smokeless tobacco: Never Used  . Alcohol use 1.2 - 1.8 oz/week    2 - 3 Standard drinks or equivalent per week     Comment:  rarely  . Drug use: No  . Sexual activity: Not Currently    Partners: Male   Other Topics Concern  . Not on file   Social History Narrative   Married 4 years- divorced; married 10 years- divorced. Serially monogamous relationships. Remarried March '11. 2 sons- '82, '87.    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 (June "12).    Lives in her own home.  Currently going through a divorce.    Family History  Problem Relation Age of Onset  . Coronary artery disease Father   . Heart disease Father   . Hypertension Father   . Irritable bowel syndrome Father   . Arthritis Mother   . Hypertension Mother   . Migraines Mother   . Heart disease Mother   . Diabetes Maternal Grandmother   . Irritable bowel syndrome Sister   . Irritable bowel syndrome Brother   . Colon cancer Neg Hx   . Esophageal cancer Neg Hx   . Rectal cancer Neg Hx   . Stomach cancer Neg Hx       Marti Sleigh, MD 02/21/2017, 1:17 PM

## 2017-02-25 ENCOUNTER — Ambulatory Visit: Payer: 59 | Admitting: Psychology

## 2017-03-03 ENCOUNTER — Ambulatory Visit (INDEPENDENT_AMBULATORY_CARE_PROVIDER_SITE_OTHER): Payer: Self-pay | Admitting: Psychology

## 2017-03-03 ENCOUNTER — Other Ambulatory Visit: Payer: Self-pay | Admitting: Internal Medicine

## 2017-03-03 DIAGNOSIS — F4321 Adjustment disorder with depressed mood: Secondary | ICD-10-CM

## 2017-03-23 ENCOUNTER — Ambulatory Visit (INDEPENDENT_AMBULATORY_CARE_PROVIDER_SITE_OTHER): Payer: Medicare Other | Admitting: Psychology

## 2017-03-23 DIAGNOSIS — F331 Major depressive disorder, recurrent, moderate: Secondary | ICD-10-CM | POA: Diagnosis not present

## 2017-03-25 ENCOUNTER — Ambulatory Visit: Payer: Medicare Other | Admitting: Psychology

## 2017-03-28 ENCOUNTER — Ambulatory Visit (INDEPENDENT_AMBULATORY_CARE_PROVIDER_SITE_OTHER): Payer: Medicare Other | Admitting: Psychology

## 2017-03-28 DIAGNOSIS — F4321 Adjustment disorder with depressed mood: Secondary | ICD-10-CM | POA: Diagnosis not present

## 2017-04-06 ENCOUNTER — Ambulatory Visit: Payer: Medicare Other | Admitting: Psychology

## 2017-04-19 DIAGNOSIS — H25813 Combined forms of age-related cataract, bilateral: Secondary | ICD-10-CM | POA: Diagnosis not present

## 2017-04-19 DIAGNOSIS — H524 Presbyopia: Secondary | ICD-10-CM | POA: Diagnosis not present

## 2017-04-22 ENCOUNTER — Ambulatory Visit: Payer: Medicare Other | Admitting: Psychology

## 2017-04-29 ENCOUNTER — Ambulatory Visit: Payer: Self-pay

## 2017-04-29 NOTE — Telephone Encounter (Signed)
Pt calling regarding 2 days ago onset of shortness of breath, feeling shaky, weakness, and fatigue and body aches (especially lower back and hips). Denied fever or runny nose. Pt states she was dx with asthma in her 10's and was seeing a pulmonologist for that. - Pt then ended call. She is at work and stated she will call back Answer Assessment - Initial Assessment Questions 1. RESPIRATORY STATUS: "Describe your breathing?" (e.g., wheezing, shortness of breath, unable to speak, severe coughing)      Shortness of breath, coughing and wheezing just in the am shortly after awakening. Sattes she drinking cup of hot tea and eases symptoms 2. ONSET: "When did this breathing problem begin?"      2 days ago 3. PATTERN "Does the difficult breathing come and go, or has it been constant since it started?"      Constant It feels like I cant take a deep breath. States chest feels "tight" 4. SEVERITY: "How bad is your breathing?" (e.g., mild, moderate, severe)    - MILD: No SOB at rest, mild SOB with walking, speaks normally in sentences, can lay down, no retractions, pulse < 100.    - MODERATE: SOB at rest, SOB with minimal exertion and prefers to sit, cannot lie down flat, speaks in phrases, mild retractions, audible wheezing, pulse 100-120.    - SEVERE: Very SOB at rest, speaks in single words, struggling to breathe, sitting hunched forward, retractions, pulse > 120      Moderate SOB  5. RECURRENT SYMPTOM: "Have you had difficulty breathing before?" If so, ask: "When was the last time?" and "What happened that time?"      Has had trouble with difficulty breathing Went to pulmonologist and rx inhaler. 6. CARDIAC HISTORY: "Do you have any history of heart disease?" (e.g., heart attack, angina, bypass surgery, angioplasty)     No  7. LUNG HISTORY: "Do you have any history of lung disease?"  (e.g., pulmonary embolus, asthma, emphysema)     Asthma dx in her 72's  8. CAUSE: "What do you think is causing the  breathing problem?"     Pt states that she thinks fatigue is causing the breathing problem 9. OTHER SYMPTOMS: "Do you have any other symptoms? (e.g., dizziness, runny nose, cough, chest pain, fever)     + dizziness, shakiness, weakness, fatigue. No fever, no runny nose 10. PREGNANCY: "Is there any chance you are pregnant?" "When was your last menstrual period?"       n/a 11. TRAVEL: "Have you traveled out of the country in the last month?" (e.g., travel history, exposures)       *No Answer*  Protocols used: BREATHING DIFFICULTY-A-AH

## 2017-04-30 ENCOUNTER — Ambulatory Visit (INDEPENDENT_AMBULATORY_CARE_PROVIDER_SITE_OTHER): Payer: Medicare Other | Admitting: Physician Assistant

## 2017-04-30 ENCOUNTER — Ambulatory Visit: Payer: Self-pay | Admitting: Family Medicine

## 2017-04-30 ENCOUNTER — Encounter: Payer: Self-pay | Admitting: Physician Assistant

## 2017-04-30 VITALS — BP 130/90 | HR 93 | Temp 98.7°F | Resp 16 | Wt 147.0 lb

## 2017-04-30 DIAGNOSIS — F329 Major depressive disorder, single episode, unspecified: Secondary | ICD-10-CM | POA: Diagnosis not present

## 2017-04-30 DIAGNOSIS — M791 Myalgia, unspecified site: Secondary | ICD-10-CM

## 2017-04-30 DIAGNOSIS — F419 Anxiety disorder, unspecified: Secondary | ICD-10-CM

## 2017-04-30 DIAGNOSIS — F32A Depression, unspecified: Secondary | ICD-10-CM

## 2017-04-30 NOTE — Progress Notes (Signed)
Patient presents to clinic today c/o 2 days of muscle aches, fatigue and increased anxiety. States she has had some associated chest tightness during episodes of anxiety. Muscle aches are not present in the morning or evening. Only present when she starts walking. Denies any focal pain. Denies feer or chills. Denies URI symptoms, abdominal pain, nausea/vomtiing, or change to bowel/bladder habits. States she spoke with her Psychiatrist, Dr. Casimiro Needle, yesterday who felt was anxiety related. As such he increased her Xanax to TID dosing until her follow-up with him next week.     Past Medical History:  Diagnosis Date  . Allergic rhinitis, cause unspecified   . Anxiety   . Asthma    chronic cough  . Bipolar I disorder, most recent episode (or current) depressed, severe, without mention of psychotic behavior   . Blood transfusion without reported diagnosis   . Deep phlebothrombosis, postpartum, unspecified as to episode of care(671.40)   . Depression   . Fibromyalgia   . Gastritis   . GERD (gastroesophageal reflux disease)   . Hyperlipidemia   . IBS (irritable bowel syndrome)   . 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 Migraine headache   . Other and unspecified hyperlipidemia   . Parathyroid adenoma 2014  . Status post chemotherapy 2014    Current Outpatient Medications on File Prior to Visit  Medication Sig Dispense Refill  . ALPRAZolam (XANAX) 0.5 MG tablet Take 1 tablet (0.5 mg total) by mouth 2 (two) times daily as needed for anxiety. (Patient taking differently: Take 0.5 mg by mouth 3 (three) times daily as needed for anxiety. ) 60 tablet 0  . busPIRone (BUSPAR) 10 MG tablet Take 1 tablet (10 mg total) by mouth 2 (two) times daily. 180 tablet 2  . Cholecalciferol (D3-1000) 1000 units capsule Take 1,000 Units by mouth daily.    . Cyanocobalamin (VITAMIN B-12) 1000 MCG SUBL Place 1 tablet (1,000 mcg total) under the tongue daily. 100 tablet 3  . cyclobenzaprine (FLEXERIL)  10 MG tablet Take 10 mg by mouth 2 (two) times daily.    . D-Mannose 500 MG CAPS Take 1 capsule by mouth daily.    Marland Kitchen esomeprazole (NEXIUM) 40 MG capsule Take 1 capsule (40 mg total) by mouth 2 (two) times daily before a meal. 180 capsule 1  . estradiol (ESTRACE) 1 MG tablet Take 1 tablet (1 mg total) by mouth daily. 90 tablet 3  . FERREX 150 150 MG capsule TAKE 1 CAPSULE (150 MG TOTAL) BY MOUTH 2 (TWO) TIMES DAILY. 60 capsule 6  . FLUoxetine (PROZAC) 40 MG capsule Take 1 capsule (40 mg total) by mouth every morning. 90 capsule 2  . levothyroxine (SYNTHROID, LEVOTHROID) 100 MCG tablet Take 1 tablet (100 mcg total) by mouth daily before breakfast. 90 tablet 2  . mirtazapine (REMERON) 30 MG tablet Take 1 tablet (30 mg total) by mouth at bedtime. Patient needs office visit before refills will be given 90 tablet 0  . Multiple Vitamins-Minerals (MULTIVITAMIN WITH MINERALS) tablet Take 1 tablet by mouth daily.    . ondansetron (ZOFRAN-ODT) 4 MG disintegrating tablet Take 4 mg by mouth every 8 (eight) hours as needed for nausea or vomiting.   0  . progesterone (PROMETRIUM) 100 MG capsule Take 100 mg by mouth at bedtime.    . SUMAtriptan (IMITREX) 100 MG tablet Take 1 tablet (100 mg total) by mouth every 2 (two) hours as needed for migraine. May repeat in 2 hours if headache persists  or recurs. 12 tablet 5  . SUMAtriptan (IMITREX) 6 MG/0.5ML SOLN injection Inject 6 mg into the skin every 2 (two) hours as needed for migraine or headache. Reported on 08/11/2015    . traMADol (ULTRAM) 50 MG tablet Take 50 mg by mouth every 6 (six) hours as needed (for pain).      No current facility-administered medications on file prior to visit.     Allergies  Allergen Reactions  . Buprenorphine Hcl Other (See Comments)    Severe headache, confusion  . Morphine And Related Other (See Comments)    Severe headache  . Other     Trees & Grass  . Codeine Other (See Comments)    Disoriented  . Dust Mite Extract Cough  .  Molds & Smuts Cough    Family History  Problem Relation Age of Onset  . Coronary artery disease Father   . Heart disease Father   . Hypertension Father   . Irritable bowel syndrome Father   . Arthritis Mother   . Hypertension Mother   . Migraines Mother   . Heart disease Mother   . Diabetes Maternal Grandmother   . Irritable bowel syndrome Sister   . Irritable bowel syndrome Brother   . Colon cancer Neg Hx   . Esophageal cancer Neg Hx   . Rectal cancer Neg Hx   . Stomach cancer Neg Hx     Social History   Socioeconomic History  . Marital status: Divorced    Spouse name: None  . Number of children: 2  . Years of education: 37  . Highest education level: None  Social Needs  . Financial resource strain: None  . Food insecurity - worry: None  . Food insecurity - inability: None  . Transportation needs - medical: None  . Transportation needs - non-medical: None  Occupational History  . 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 Smoking status: Never Smoker  . Smokeless tobacco: Never Used  Substance and Sexual Activity  . Alcohol use: Yes    Alcohol/week: 1.2 - 1.8 oz    Types: 2 - 3 Standard drinks or equivalent per week    Comment: rarely  . Drug use: No  . Sexual activity: Not Currently    Partners: Male  Other Topics Concern  . None  Social History Narrative   Married 4 years- divorced; married 10 years- divorced. Serially monogamous relationships. Remarried March '11. 2 sons- '82, '87.    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 (June "12).    Lives in her own home.  Currently going through a divorce.    Review of Systems - See HPI.  All other ROS are negative.  BP 130/90   Pulse 93   Temp 98.7 F (37.1 C) (Oral)   Resp 16   Wt 147 lb (66.7 kg)   SpO2 98%   BMI 25.23 kg/m   Physical Exam  Constitutional: She is oriented to person, place, and  time and well-developed, well-nourished, and in no distress.  HENT:  Head: Normocephalic and atraumatic.  Right Ear: Tympanic membrane normal.  Left Ear: Tympanic membrane normal.  Nose: Nose normal. No mucosal edema or rhinorrhea. Right sinus exhibits no maxillary sinus tenderness and no frontal sinus tenderness. Left sinus exhibits no maxillary sinus tenderness and no frontal sinus tenderness.  Eyes: Conjunctivae are normal.  Neck: Neck supple.  Cardiovascular: Normal  rate, regular rhythm, normal heart sounds and intact distal pulses.  Pulmonary/Chest: Effort normal and breath sounds normal. No respiratory distress. She has no wheezes. She has no rales. She exhibits no tenderness.  Lymphadenopathy:    She has no cervical adenopathy.  Neurological: She is alert and oriented to person, place, and time. No cranial nerve deficit.  Skin: Skin is warm and dry. No rash noted.  Psychiatric: Affect normal. Her mood appears anxious. She does not exhibit a depressed mood. She expresses no homicidal and no suicidal ideation. She expresses no suicidal plans and no homicidal plans.  Vitals reviewed.  Assessment/Plan: 1. Anxiety and depression Followed by Psychiatry. Currently on Fluoxetine, Remeron, BuSpar and Alprazolam, now taking TID. Will need to speak to her psychiatrist again or her PCP regarding medication changes. Discussed with her that since this is Saturday clinic my resources are limited. There are no alarm signs/symptoms. Will check TSH level Monday due to her increased anxiety levels. She is to follow-up with specialist as scheduled. ER if any worsening of symptoms.  2. Myalgia Unclear -- potentially viral in nature or related to worsened anxiety/mood. Exam unremarkable. Unable to check labs today at Saturday clinic. Did place future orders for TSH, CBC w diff and ESR. Supportive measures and OTC medications reviewed. Close Follow-up with PCP recommended.    Leeanne Rio, PA-C

## 2017-04-30 NOTE — Patient Instructions (Addendum)
Please stay well-hydrated and get plenty of rest.  Tylenol if needed for aches. A Warm bath or shower will also be beneficial. I do think anxiety is a major component.  I want you to call your Psychiatrist to help make further changes in your regimen since he just made an adjustment with your Xanax. Please come by the [facility] office Monday morning so I can check labs on you.  If you note any worsening or new symptoms, please be seen at the ER this weekend.

## 2017-05-02 ENCOUNTER — Other Ambulatory Visit (INDEPENDENT_AMBULATORY_CARE_PROVIDER_SITE_OTHER): Payer: Medicare Other

## 2017-05-02 DIAGNOSIS — F32A Depression, unspecified: Secondary | ICD-10-CM

## 2017-05-02 DIAGNOSIS — F419 Anxiety disorder, unspecified: Secondary | ICD-10-CM

## 2017-05-02 DIAGNOSIS — F329 Major depressive disorder, single episode, unspecified: Secondary | ICD-10-CM | POA: Diagnosis not present

## 2017-05-02 DIAGNOSIS — M791 Myalgia, unspecified site: Secondary | ICD-10-CM

## 2017-05-02 LAB — CBC WITH DIFFERENTIAL/PLATELET
Basophils Absolute: 0.1 10*3/uL (ref 0.0–0.1)
Basophils Relative: 1.4 % (ref 0.0–3.0)
Eosinophils Absolute: 0.2 10*3/uL (ref 0.0–0.7)
Eosinophils Relative: 2.8 % (ref 0.0–5.0)
HCT: 41 % (ref 36.0–46.0)
Hemoglobin: 13.7 g/dL (ref 12.0–15.0)
Lymphocytes Relative: 26.1 % (ref 12.0–46.0)
Lymphs Abs: 1.7 10*3/uL (ref 0.7–4.0)
MCHC: 33.5 g/dL (ref 30.0–36.0)
MCV: 91 fl (ref 78.0–100.0)
Monocytes Absolute: 0.4 10*3/uL (ref 0.1–1.0)
Monocytes Relative: 5.9 % (ref 3.0–12.0)
Neutro Abs: 4.3 10*3/uL (ref 1.4–7.7)
Neutrophils Relative %: 63.8 % (ref 43.0–77.0)
Platelets: 398 10*3/uL (ref 150.0–400.0)
RBC: 4.5 Mil/uL (ref 3.87–5.11)
RDW: 13.7 % (ref 11.5–15.5)
WBC: 6.7 10*3/uL (ref 4.0–10.5)

## 2017-05-02 LAB — SEDIMENTATION RATE: Sed Rate: 12 mm/hr (ref 0–30)

## 2017-05-02 LAB — TSH: TSH: 1.62 u[IU]/mL (ref 0.35–4.50)

## 2017-05-05 ENCOUNTER — Ambulatory Visit: Payer: Medicare Other | Admitting: Psychology

## 2017-05-11 ENCOUNTER — Ambulatory Visit (INDEPENDENT_AMBULATORY_CARE_PROVIDER_SITE_OTHER): Payer: Medicare Other | Admitting: Psychology

## 2017-05-11 DIAGNOSIS — H2511 Age-related nuclear cataract, right eye: Secondary | ICD-10-CM | POA: Diagnosis not present

## 2017-05-11 DIAGNOSIS — F4321 Adjustment disorder with depressed mood: Secondary | ICD-10-CM | POA: Diagnosis not present

## 2017-05-13 ENCOUNTER — Telehealth: Payer: Self-pay | Admitting: Internal Medicine

## 2017-05-13 MED ORDER — LEVOTHYROXINE SODIUM 100 MCG PO TABS: 100.0000 ug | ORAL_TABLET | Freq: Every day | ORAL | 2 refills | Status: DC

## 2017-05-13 NOTE — Telephone Encounter (Signed)
Patient requesting refill on levothyroxine and progesterone to be sent to [facility] on [address].

## 2017-05-13 NOTE — Telephone Encounter (Signed)
Levothyroxine sent, please advise about progesterone as you have not prescribed this

## 2017-05-15 NOTE — Telephone Encounter (Signed)
Progesterone should be obtained via GYN Thx

## 2017-05-16 NOTE — Telephone Encounter (Signed)
LM notifying pt

## 2017-05-17 HISTORY — PX: CATARACT EXTRACTION W/ INTRAOCULAR LENS  IMPLANT, BILATERAL: SHX1307

## 2017-05-17 HISTORY — PX: CATARACT EXTRACTION, BILATERAL: SHX1313

## 2017-05-17 HISTORY — PX: OTHER SURGICAL HISTORY: SHX169

## 2017-05-18 ENCOUNTER — Ambulatory Visit (INDEPENDENT_AMBULATORY_CARE_PROVIDER_SITE_OTHER): Payer: Medicare HMO | Admitting: Psychology

## 2017-05-18 DIAGNOSIS — F4321 Adjustment disorder with depressed mood: Secondary | ICD-10-CM

## 2017-05-18 DIAGNOSIS — R69 Illness, unspecified: Secondary | ICD-10-CM | POA: Diagnosis not present

## 2017-05-23 ENCOUNTER — Ambulatory Visit: Payer: Medicare Other | Admitting: Psychology

## 2017-05-23 DIAGNOSIS — H25811 Combined forms of age-related cataract, right eye: Secondary | ICD-10-CM | POA: Diagnosis not present

## 2017-05-23 DIAGNOSIS — H2511 Age-related nuclear cataract, right eye: Secondary | ICD-10-CM | POA: Diagnosis not present

## 2017-05-31 ENCOUNTER — Encounter: Payer: Self-pay | Admitting: Internal Medicine

## 2017-05-31 ENCOUNTER — Ambulatory Visit (INDEPENDENT_AMBULATORY_CARE_PROVIDER_SITE_OTHER): Payer: Medicare HMO | Admitting: Internal Medicine

## 2017-05-31 VITALS — BP 152/94 | HR 91 | Temp 98.3°F | Ht 64.0 in | Wt 149.0 lb

## 2017-05-31 DIAGNOSIS — D509 Iron deficiency anemia, unspecified: Secondary | ICD-10-CM | POA: Diagnosis not present

## 2017-05-31 DIAGNOSIS — H2512 Age-related nuclear cataract, left eye: Secondary | ICD-10-CM | POA: Diagnosis not present

## 2017-05-31 DIAGNOSIS — R7309 Other abnormal glucose: Secondary | ICD-10-CM | POA: Diagnosis not present

## 2017-05-31 DIAGNOSIS — I10 Essential (primary) hypertension: Secondary | ICD-10-CM

## 2017-05-31 DIAGNOSIS — Z23 Encounter for immunization: Secondary | ICD-10-CM | POA: Diagnosis not present

## 2017-05-31 DIAGNOSIS — F4322 Adjustment disorder with anxiety: Secondary | ICD-10-CM | POA: Diagnosis not present

## 2017-05-31 DIAGNOSIS — G43019 Migraine without aura, intractable, without status migrainosus: Secondary | ICD-10-CM | POA: Diagnosis not present

## 2017-05-31 DIAGNOSIS — D649 Anemia, unspecified: Secondary | ICD-10-CM | POA: Insufficient documentation

## 2017-05-31 DIAGNOSIS — R69 Illness, unspecified: Secondary | ICD-10-CM | POA: Diagnosis not present

## 2017-05-31 HISTORY — DX: Anemia, unspecified: D64.9

## 2017-05-31 HISTORY — DX: Essential (primary) hypertension: I10

## 2017-05-31 MED ORDER — VERAPAMIL HCL ER 120 MG PO TBCR: 120.0000 mg | EXTENDED_RELEASE_TABLET | Freq: Every day | ORAL | 11 refills | Status: DC

## 2017-05-31 NOTE — Assessment & Plan Note (Signed)
Labs On PO iron

## 2017-05-31 NOTE — Assessment & Plan Note (Signed)
1/19 Verapamil at hs

## 2017-05-31 NOTE — Addendum Note (Signed)
Addended byKarren Cobble on: 05/31/2017 05:00 PM   Modules accepted: Orders

## 2017-05-31 NOTE — Assessment & Plan Note (Signed)
Verapamil at hs

## 2017-05-31 NOTE — Assessment & Plan Note (Signed)
F/u Dr Casimiro Needle

## 2017-05-31 NOTE — Progress Notes (Signed)
Subjective:  Patient ID: [name], female    DOB: [birth year]  Age: 67 y.o. MRN: [id number]  CC: No chief complaint on file.   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 ALPRAZolam (XANAX) 0.5 MG tablet Take 1 tablet (0.5 mg total) by mouth 2 (two) times daily as needed for anxiety. (Patient taking differently: Take 0.5 mg by mouth 3 (three) times daily as needed for anxiety. ) 60 tablet 0  . busPIRone (BUSPAR) 10 MG tablet Take 1 tablet (10 mg total) by mouth 2 (two) times daily. 180 tablet 2  . Cholecalciferol (D3-1000) 1000 units capsule Take 1,000 Units by mouth daily.    . Cyanocobalamin (VITAMIN B-12) 1000 MCG SUBL Place 1 tablet (1,000 mcg total) under the tongue daily. 100 tablet 3  . cyclobenzaprine (FLEXERIL) 10 MG tablet Take 10 mg by mouth 2 (two) times daily.    . D-Mannose 500 MG CAPS Take 1 capsule by mouth daily.    Marland Kitchen esomeprazole (NEXIUM) 40 MG capsule Take 1 capsule (40 mg total) by mouth 2 (two) times daily before a meal. 180 capsule 1  . estradiol (ESTRACE) 1 MG tablet Take 1 tablet (1 mg total) by mouth daily. 90 tablet 3  . FERREX 150 150 MG capsule TAKE 1 CAPSULE (150 MG TOTAL) BY MOUTH 2 (TWO) TIMES DAILY. 60 capsule 6  . FLUoxetine (PROZAC) 40 MG capsule Take 1 capsule (40 mg total) by mouth every morning. 90 capsule 2  . levothyroxine (SYNTHROID, LEVOTHROID) 100 MCG tablet Take 1 tablet (100 mcg total) by mouth daily before breakfast. 90 tablet 2  . mirtazapine (REMERON) 30 MG tablet Take 1 tablet (30 mg total) by mouth at bedtime. Patient needs office visit before refills will be given 90 tablet 0  . Multiple Vitamins-Minerals (MULTIVITAMIN WITH MINERALS) tablet Take 1 tablet by mouth daily.    . ondansetron (ZOFRAN-ODT) 4 MG disintegrating tablet Take 4 mg by mouth every 8 (eight) hours as needed for  nausea or vomiting.   0  . progesterone (PROMETRIUM) 100 MG capsule Take 100 mg by mouth at bedtime.    . SUMAtriptan (IMITREX) 100 MG tablet Take 1 tablet (100 mg total) by mouth every 2 (two) hours as needed for migraine. May repeat in 2 hours if headache persists or recurs. 12 tablet 5  . SUMAtriptan (IMITREX) 6 MG/0.5ML SOLN injection Inject 6 mg into the skin every 2 (two) hours as needed for migraine or headache. Reported on 08/11/2015    . traMADol (ULTRAM) 50 MG tablet Take 50 mg by mouth every 6 (six) hours as needed (for pain).      No facility-administered medications prior to visit.     ROS Review of Systems  Constitutional: Negative for activity change, appetite change, chills, fatigue and unexpected weight change.  HENT: Negative for congestion, mouth sores and sinus pressure.   Eyes: Negative for visual disturbance.  Respiratory: Negative for cough and chest tightness.   Gastrointestinal: Negative for abdominal pain and nausea.  Genitourinary: Negative for difficulty urinating, frequency and vaginal pain.  Musculoskeletal: Negative for back pain and gait problem.  Skin: Negative for pallor and rash.  Neurological: Negative for dizziness, tremors, weakness, numbness and headaches.  Psychiatric/Behavioral: Negative for confusion and sleep disturbance. The patient is nervous/anxious.     Objective:  BP Marland Kitchen)  152/94 (BP Location: Left Arm, Patient Position: Sitting, Cuff Size: Normal)   Pulse 91   Temp 98.3 F (36.8 C) (Oral)   Ht 5\' 4"  (1.626 m)   Wt 149 lb (67.6 kg)   SpO2 99%   BMI 25.58 kg/m   BP Readings from Last 3 Encounters:  05/31/17 (!) 152/94  04/30/17 130/90  02/21/17 140/90    Wt Readings from Last 3 Encounters:  05/31/17 149 lb (67.6 kg)  04/30/17 147 lb (66.7 kg)  02/21/17 143 lb (64.9 kg)    Physical Exam  Constitutional: She appears well-developed. No distress.  HENT:  Head: Normocephalic.  Right Ear: External ear normal.  Left Ear: External  ear normal.  Nose: Nose normal.  Mouth/Throat: Oropharynx is clear and moist.  Eyes: Conjunctivae are normal. Pupils are equal, round, and reactive to light. Right eye exhibits no discharge. Left eye exhibits no discharge.  Neck: Normal range of motion. Neck supple. No JVD present. No tracheal deviation present. No thyromegaly present.  Cardiovascular: Normal rate, regular rhythm and normal heart sounds.  Pulmonary/Chest: No stridor. No respiratory distress. She has no wheezes.  Abdominal: Soft. Bowel sounds are normal. She exhibits no distension and no mass. There is no tenderness. There is no rebound and no guarding.  Musculoskeletal: She exhibits no edema or tenderness.  Lymphadenopathy:    She has no cervical adenopathy.  Neurological: She displays normal reflexes. No cranial nerve deficit. She exhibits normal muscle tone. Coordination normal.  Skin: No rash noted. No erythema.  Psychiatric: She has a normal mood and affect. Her behavior is normal. Judgment and thought content normal.    Lab Results  Component Value Date   WBC 6.7 05/02/2017   HGB 13.7 05/02/2017   HCT 41.0 05/02/2017   PLT 398.0 05/02/2017   GLUCOSE 121 (H) 12/29/2016   CHOL 249 (H) 05/05/2016   TRIG 213.0 (H) 05/05/2016   HDL 54.20 05/05/2016   LDLDIRECT 161.0 05/05/2016   LDLCALC 96 06/16/2007   ALT 11 12/29/2016   AST 15 12/29/2016   NA 135 12/29/2016   K 4.3 12/29/2016   CL 105 12/29/2016   CREATININE 0.96 12/29/2016   BUN 29 (H) 12/29/2016   CO2 23 12/29/2016   TSH 1.62 05/02/2017   INR 1.00 04/30/2013   HGBA1C 5.5 09/23/2008    Mr Lumbar Spine Wo Contrast  Result Date: 12/24/2016 CLINICAL DATA:  Initial evaluation for low back pain for 2 weeks, radiating down left leg. EXAM: MRI LUMBAR SPINE WITHOUT CONTRAST TECHNIQUE: Multiplanar, multisequence MR imaging of the lumbar spine was performed. No intravenous contrast was administered. COMPARISON:  Prior none available. FINDINGS: Segmentation: Normal  segmentation. Lowest well-formed disc labeled the L5-S1 level. Alignment: Vertebral bodies normally aligned with preservation of the normal lumbar lordosis. No listhesis. Vertebrae: Vertebral body heights are maintained. No evidence for acute or chronic fracture. Signal intensity within the vertebral body bone marrow is normal. Benign hemangioma noted within the L4 vertebral body. No other discrete or worrisome osseous lesions. No abnormal marrow edema. Conus medullaris: Extends to the L1-2 a level and appears normal. Paraspinal and other soft tissues: Paraspinous soft tissues within normal limits. Visualized visceral structures are normal. Disc levels: L1-2:  Unremarkable. L2-3: Diffuse degenerative disc bulge with intervertebral disc space narrowing and disc desiccation. Mild bilateral facet and ligamentum flavum hypertrophy. No significant stenosis. L3-4: Mild diffuse circumferential disc bulge. Bilateral facet and ligamentum flavum hypertrophy. Mild bilateral lateral recess narrowing without significant canal stenosis. Foramina are widely patent.  L4-5: Minimal annular disc bulge. Mild facet and ligamentum flavum hypertrophy. Mild bilateral lateral recess stenosis. No significant foraminal encroachment. L5-S1: Left subarticular/foraminal disc protrusion extends into the left lateral recess (series 6, image 28). Mass effect on the descending left S1 nerve root which is displaced posteriorly. Mild facet degeneration. No significant canal or foraminal stenosis. IMPRESSION: 1. Left subarticular/foraminal disc protrusion at L5-S1, extending into the left lateral recess with secondary mass effect on the descending left S1 nerve root. 2. 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 Electronically Signed   By: Jeannine Boga M.D.   On: 12/24/2016 16:56    Assessment & Plan:   There are no diagnoses linked to this encounter. I am  having Charlotte Lick Masters Marks maintain her SUMAtriptan, esomeprazole, estradiol, multivitamin with minerals, traMADol, Vitamin B-12, FLUoxetine, busPIRone, ALPRAZolam, ondansetron, progesterone, Cholecalciferol, FERREX 150, SUMAtriptan, mirtazapine, cyclobenzaprine, D-Mannose, and levothyroxine.  No orders of the defined types were placed in this encounter.    Follow-up: No Follow-up on file.  Walker Kehr, MD

## 2017-06-06 DIAGNOSIS — R69 Illness, unspecified: Secondary | ICD-10-CM | POA: Diagnosis not present

## 2017-06-06 DIAGNOSIS — F330 Major depressive disorder, recurrent, mild: Secondary | ICD-10-CM | POA: Diagnosis not present

## 2017-06-13 DIAGNOSIS — H2512 Age-related nuclear cataract, left eye: Secondary | ICD-10-CM | POA: Diagnosis not present

## 2017-06-13 DIAGNOSIS — H25812 Combined forms of age-related cataract, left eye: Secondary | ICD-10-CM | POA: Diagnosis not present

## 2017-06-20 ENCOUNTER — Ambulatory Visit: Payer: Medicare HMO | Admitting: Psychology

## 2017-06-23 ENCOUNTER — Ambulatory Visit (INDEPENDENT_AMBULATORY_CARE_PROVIDER_SITE_OTHER): Payer: Medicare HMO | Admitting: Psychology

## 2017-06-23 DIAGNOSIS — R69 Illness, unspecified: Secondary | ICD-10-CM | POA: Diagnosis not present

## 2017-06-23 DIAGNOSIS — F4321 Adjustment disorder with depressed mood: Secondary | ICD-10-CM | POA: Diagnosis not present

## 2017-06-27 ENCOUNTER — Ambulatory Visit: Payer: Medicare HMO | Admitting: Psychology

## 2017-06-27 ENCOUNTER — Other Ambulatory Visit (INDEPENDENT_AMBULATORY_CARE_PROVIDER_SITE_OTHER): Payer: Medicare HMO

## 2017-06-27 DIAGNOSIS — R202 Paresthesia of skin: Secondary | ICD-10-CM

## 2017-06-27 DIAGNOSIS — R7309 Other abnormal glucose: Secondary | ICD-10-CM | POA: Diagnosis not present

## 2017-06-27 DIAGNOSIS — Z01419 Encounter for gynecological examination (general) (routine) without abnormal findings: Secondary | ICD-10-CM | POA: Diagnosis not present

## 2017-06-27 DIAGNOSIS — D509 Iron deficiency anemia, unspecified: Secondary | ICD-10-CM

## 2017-06-27 DIAGNOSIS — E559 Vitamin D deficiency, unspecified: Secondary | ICD-10-CM

## 2017-06-27 DIAGNOSIS — Z124 Encounter for screening for malignant neoplasm of cervix: Secondary | ICD-10-CM | POA: Diagnosis not present

## 2017-06-27 DIAGNOSIS — Z6826 Body mass index (BMI) 26.0-26.9, adult: Secondary | ICD-10-CM | POA: Diagnosis not present

## 2017-06-27 LAB — BASIC METABOLIC PANEL
BUN: 21 mg/dL (ref 6–23)
CO2: 25 mEq/L (ref 19–32)
Calcium: 9.7 mg/dL (ref 8.4–10.5)
Chloride: 102 mEq/L (ref 96–112)
Creatinine, Ser: 1.1 mg/dL (ref 0.40–1.20)
GFR: 52.59 mL/min — ABNORMAL LOW (ref >60.00)
Glucose, Bld: 97 mg/dL (ref 70–99)
Potassium: 4.1 mEq/L (ref 3.5–5.1)
Sodium: 138 mEq/L (ref 135–145)

## 2017-06-27 LAB — VITAMIN D 25 HYDROXY (VIT D DEFICIENCY, FRACTURES): VITD: 53.01 ng/mL (ref 30.00–100.00)

## 2017-06-27 LAB — HEMOGLOBIN A1C: Hgb A1c MFr Bld: 5.5 % (ref 4.6–6.5)

## 2017-06-28 LAB — CBC WITH DIFFERENTIAL/PLATELET
Basophils Absolute: 92 cells/uL (ref 0–200)
Basophils Relative: 1.3 %
Eosinophils Absolute: 199 cells/uL (ref 15–500)
Eosinophils Relative: 2.8 %
HCT: 38.7 % (ref 35.0–45.0)
Hemoglobin: 13.3 g/dL (ref 11.7–15.5)
Lymphs Abs: 1640 cells/uL (ref 850–3900)
MCH: 30.2 pg (ref 27.0–33.0)
MCHC: 34.4 g/dL (ref 32.0–36.0)
MCV: 88 fL (ref 80.0–100.0)
MPV: 8.1 fL (ref 7.5–12.5)
Monocytes Relative: 6 %
Neutro Abs: 4743 cells/uL (ref 1500–7800)
Neutrophils Relative %: 66.8 %
Platelets: 356 10*3/uL (ref 140–400)
RBC: 4.4 10*6/uL (ref 3.80–5.10)
RDW: 13.2 % (ref 11.0–15.0)
Total Lymphocyte: 23.1 %
WBC mixed population: 426 cells/uL (ref 200–950)
WBC: 7.1 10*3/uL (ref 3.8–10.8)

## 2017-06-28 LAB — IRON,TIBC AND FERRITIN PANEL
%SAT: 28 % (calc) (ref 11–50)
Ferritin: 32 ng/mL (ref 20–288)
Iron: 119 ug/dL (ref 45–160)
TIBC: 431 mcg/dL (calc) (ref 250–450)

## 2017-07-08 ENCOUNTER — Telehealth: Payer: Self-pay

## 2017-07-08 NOTE — Telephone Encounter (Signed)
error 

## 2017-07-11 ENCOUNTER — Ambulatory Visit: Payer: Medicare HMO

## 2017-07-11 ENCOUNTER — Ambulatory Visit: Payer: Medicare HMO | Admitting: Psychology

## 2017-07-11 ENCOUNTER — Ambulatory Visit: Payer: Self-pay

## 2017-07-11 NOTE — Telephone Encounter (Signed)
Call rec'd from pt.  C/o elevated BP; 169/133 @ 8:00 PM;  201/110 @ approx. 10:00 PM last night.  Reported she took an extra dose of Verapamil at 10:00 PM.  Today c/o dull headache.  Reported BP's : 172/96 @ 1:30 PM, 169/130 @ about 2:15 PM.  Denied any speech difficulty.  C/o feeling light headed with movement, nausea, and jittery.  Denied unilateral weakness, speech difficulty, facial droop. Stated her vision is slightly blurred, but did have bilateral cataract removal about 1 mo. ago.  Stated she feels tired today. Also, reported she feels a little anxious today, because she is getting behind at work.  (has appt. with Dr. Alain Marion on 2/27)    Called [place] [facility] [place] with Almyra Free.  Advised to have pt. come in for a nurse visit, to evaluate her BP in the office; the pt. agreed to 4:00 PM appt. today; understands she will see a nurse for this visit.     Reason for Disposition . [6] Systolic BP  >= 384 OR Diastolic >= 536  AND [4] having NO cardiac or neurologic symptoms  Answer Assessment - Initial Assessment Questions 1. BLOOD PRESSURE: "What is the blood pressure?" "Did you take at least two measurements 5 minutes apart?"     169/130 @ 2:15 PM  172/96 @ 1:30 PM  2. ONSET: "When did you take your blood pressure?"     On 2/24: 169/133 @ 8:00 PM;  201/110 @ 10:00 PM  3. HOW: "How did you obtain the blood pressure?" (e.g., visiting nurse, automatic home BP monitor)     [employer] automatic monitor- new.  4. HISTORY: "Do you have a history of high blood pressure?"     Recent elevation since last office visit 5. MEDICATIONS: "Are you taking any medications for blood pressure?" "Have you missed any doses recently?"     Verapamil; took an additional dose at bedtime on 2/24 6. OTHER SYMPTOMS: "Do you have any symptoms?" (e.g., headache, chest pain, blurred vision, difficulty breathing, weakness)     Dull headache; nausea, feels jittery; a little light-headed with being up and walking; blurred  vision due to Cataract surgery about 1 mo. Ago, but improved.; feels anxious due to getting behind at work.   7. PREGNANCY: "Is there any chance you are pregnant?" "When was your last menstrual period?"    n/a  Protocols used: HIGH BLOOD PRESSURE-A-AH

## 2017-07-13 ENCOUNTER — Encounter: Payer: Self-pay | Admitting: Internal Medicine

## 2017-07-13 ENCOUNTER — Other Ambulatory Visit (INDEPENDENT_AMBULATORY_CARE_PROVIDER_SITE_OTHER): Payer: Medicare HMO

## 2017-07-13 ENCOUNTER — Ambulatory Visit (INDEPENDENT_AMBULATORY_CARE_PROVIDER_SITE_OTHER): Payer: Medicare HMO | Admitting: Internal Medicine

## 2017-07-13 DIAGNOSIS — R079 Chest pain, unspecified: Secondary | ICD-10-CM | POA: Insufficient documentation

## 2017-07-13 DIAGNOSIS — R0789 Other chest pain: Secondary | ICD-10-CM | POA: Diagnosis not present

## 2017-07-13 DIAGNOSIS — I10 Essential (primary) hypertension: Secondary | ICD-10-CM | POA: Diagnosis not present

## 2017-07-13 DIAGNOSIS — M79605 Pain in left leg: Secondary | ICD-10-CM

## 2017-07-13 HISTORY — DX: Chest pain, unspecified: R07.9

## 2017-07-13 LAB — BASIC METABOLIC PANEL
BUN: 25 mg/dL — ABNORMAL HIGH (ref 6–23)
CO2: 28 mEq/L (ref 19–32)
Calcium: 10.4 mg/dL (ref 8.4–10.5)
Chloride: 103 mEq/L (ref 96–112)
Creatinine, Ser: 1.06 mg/dL (ref 0.40–1.20)
GFR: 54.87 mL/min — ABNORMAL LOW (ref >60.00)
Glucose, Bld: 86 mg/dL (ref 70–99)
Potassium: 4.8 mEq/L (ref 3.5–5.1)
Sodium: 138 mEq/L (ref 135–145)

## 2017-07-13 LAB — CREATININE KINASE MB: CK-MB: 0.7 ng/mL (ref 0.3–4.0)

## 2017-07-13 MED ORDER — ASPIRIN EC 81 MG PO TBEC: 162.0000 mg | DELAYED_RELEASE_TABLET | Freq: Every day | ORAL | 3 refills | Status: DC

## 2017-07-13 MED ORDER — TRIAMTERENE-HCTZ 37.5-25 MG PO TABS: 1.0000 | ORAL_TABLET | Freq: Every day | ORAL | 11 refills | Status: DC

## 2017-07-13 NOTE — Progress Notes (Signed)
Subjective:  Patient ID: [name], female    DOB: 03-25-1951  Age: 67 y.o. MRN: [id number]  CC: No chief complaint on file.   HPI [name] presents for elevated BP and CP. CP started on Sunday, feeling lightheaded. BP at home was 152/111. Pt's cuff 145/100 here. C/o HAs CT abd/chest - ok in June 2018 Pt is here w/her mom  Outpatient Medications Prior to Visit  Medication Sig Dispense Refill  . ALPRAZolam (XANAX) 0.5 MG tablet Take 1 tablet (0.5 mg total) by mouth 2 (two) times daily as needed for anxiety. (Patient taking differently: Take 0.5 mg by mouth 3 (three) times daily as needed for anxiety. ) 60 tablet 0  . busPIRone (BUSPAR) 30 MG tablet TAKE 2/3 TABLET(20MG ) TWICE A DAY  4  . Cholecalciferol (D3-1000) 1000 units capsule Take 1,000 Units by mouth daily.    . Cyanocobalamin (VITAMIN B-12) 1000 MCG SUBL Place 1 tablet (1,000 mcg total) under the tongue daily. 100 tablet 3  . cyclobenzaprine (FLEXERIL) 10 MG tablet Take 10 mg by mouth 2 (two) times daily.    . D-Mannose 500 MG CAPS Take 1 capsule by mouth daily.    Marland Kitchen esomeprazole (NEXIUM) 40 MG capsule Take 1 capsule (40 mg total) by mouth 2 (two) times daily before a meal. 180 capsule 1  . estradiol (ESTRACE) 1 MG tablet Take 1 tablet (1 mg total) by mouth daily. 90 tablet 3  . FERREX 150 150 MG capsule TAKE 1 CAPSULE (150 MG TOTAL) BY MOUTH 2 (TWO) TIMES DAILY. 60 capsule 6  . FLUoxetine (PROZAC) 40 MG capsule Take 1 capsule (40 mg total) by mouth every morning. 90 capsule 2  . levothyroxine (SYNTHROID, LEVOTHROID) 100 MCG tablet Take 1 tablet (100 mcg total) by mouth daily before breakfast. 90 tablet 2  . mirtazapine (REMERON) 30 MG tablet Take 1 tablet (30 mg total) by mouth at bedtime. Patient needs office visit before refills will be given 90 tablet 0  . Multiple Vitamins-Minerals (MULTIVITAMIN WITH MINERALS) tablet Take 1 tablet by mouth daily.    . ondansetron (ZOFRAN-ODT) 4 MG disintegrating tablet  Take 4 mg by mouth every 8 (eight) hours as needed for nausea or vomiting.   0  . progesterone (PROMETRIUM) 100 MG capsule Take 100 mg by mouth at bedtime.    . SUMAtriptan (IMITREX) 100 MG tablet Take 1 tablet (100 mg total) by mouth every 2 (two) hours as needed for migraine. May repeat in 2 hours if headache persists or recurs. 12 tablet 5  . SUMAtriptan (IMITREX) 6 MG/0.5ML SOLN injection Inject 6 mg into the skin every 2 (two) hours as needed for migraine or headache. Reported on 08/11/2015    . traMADol (ULTRAM) 50 MG tablet Take 50 mg by mouth every 6 (six) hours as needed (for pain).     . verapamil (CALAN-SR) 120 MG CR tablet Take 1 tablet (120 mg total) by mouth at bedtime. (Patient taking differently: Take 120 mg by mouth 2 (two) times daily. ) 30 tablet 11  . busPIRone (BUSPAR) 10 MG tablet Take 1 tablet (10 mg total) by mouth 2 (two) times daily. 180 tablet 2   No facility-administered medications prior to visit.     ROS Review of Systems  Constitutional: Positive for fatigue. Negative for activity change, appetite change, chills and unexpected weight change.  HENT: Negative for congestion, mouth sores and sinus pressure.   Eyes: Negative for visual disturbance.  Respiratory: Positive for  shortness of breath. Negative for cough and chest tightness.   Cardiovascular: Positive for chest pain and leg swelling.  Gastrointestinal: Negative for abdominal pain and nausea.  Genitourinary: Negative for difficulty urinating, frequency and vaginal pain.  Musculoskeletal: Negative for back pain and gait problem.  Skin: Negative for pallor and rash.  Neurological: Positive for dizziness, light-headedness and headaches. Negative for tremors, weakness and numbness.  Psychiatric/Behavioral: Negative for confusion and sleep disturbance.    Objective:  BP 132/88 (BP Location: Left Arm, Patient Position: Sitting, Cuff Size: Normal)   Pulse 86   Temp 97.8 F (36.6 C) (Oral)   Ht 5\' 4"  (1.626  m)   Wt 150 lb (68 kg)   SpO2 99%   BMI 25.75 kg/m   BP Readings from Last 3 Encounters:  07/13/17 132/88  07/11/17 (!) 152/108  05/31/17 (!) 152/94    Wt Readings from Last 3 Encounters:  07/13/17 150 lb (68 kg)  05/31/17 149 lb (67.6 kg)  04/30/17 147 lb (66.7 kg)    Physical Exam  Constitutional: She appears well-developed. No distress.  HENT:  Head: Normocephalic.  Right Ear: External ear normal.  Left Ear: External ear normal.  Nose: Nose normal.  Mouth/Throat: Oropharynx is clear and moist.  Eyes: Conjunctivae are normal. Pupils are equal, round, and reactive to light. Right eye exhibits no discharge. Left eye exhibits no discharge.  Neck: Normal range of motion. Neck supple. No JVD present. No tracheal deviation present. No thyromegaly present.  Cardiovascular: Normal rate, regular rhythm and normal heart sounds.  Pulmonary/Chest: No stridor. No respiratory distress. She has no wheezes.  Abdominal: Soft. Bowel sounds are normal. She exhibits no distension and no mass. There is no tenderness. There is no rebound and no guarding.  Musculoskeletal: She exhibits no edema or tenderness.  Lymphadenopathy:    She has no cervical adenopathy.  Neurological: She displays normal reflexes. No cranial nerve deficit. She exhibits normal muscle tone. Coordination normal.  Skin: No rash noted. No erythema.  Psychiatric: She has a normal mood and affect. Her behavior is normal. Judgment and thought content normal.  L calf is tender a little H-P (+/-) on the R  Lab Results  Component Value Date   WBC 7.1 06/27/2017   HGB 13.3 06/27/2017   HCT 38.7 06/27/2017   PLT 356 06/27/2017   GLUCOSE 97 06/27/2017   CHOL 249 (H) 05/05/2016   TRIG 213.0 (H) 05/05/2016   HDL 54.20 05/05/2016   LDLDIRECT 161.0 05/05/2016   LDLCALC 96 06/16/2007   ALT 11 12/29/2016   AST 15 12/29/2016   NA 138 06/27/2017   K 4.1 06/27/2017   CL 102 06/27/2017   CREATININE 1.10 06/27/2017   BUN 21  06/27/2017   CO2 25 06/27/2017   TSH 1.62 05/02/2017   INR 1.00 04/30/2013   HGBA1C 5.5 06/27/2017    Mr Lumbar Spine Wo Contrast  Result Date: 12/24/2016 CLINICAL DATA:  Initial evaluation for low back pain for 2 weeks, radiating down left leg. EXAM: MRI LUMBAR SPINE WITHOUT CONTRAST TECHNIQUE: Multiplanar, multisequence MR imaging of the lumbar spine was performed. No intravenous contrast was administered. COMPARISON:  Prior none available. FINDINGS: Segmentation: Normal segmentation. Lowest well-formed disc labeled the L5-S1 level. Alignment: Vertebral bodies normally aligned with preservation of the normal lumbar lordosis. No listhesis. Vertebrae: Vertebral body heights are maintained. No evidence for acute or chronic fracture. Signal intensity within the vertebral body bone marrow is normal. Benign hemangioma noted within the L4 vertebral body. No  other discrete or worrisome osseous lesions. No abnormal marrow edema. Conus medullaris: Extends to the L1-2 a level and appears normal. Paraspinal and other soft tissues: Paraspinous soft tissues within normal limits. Visualized visceral structures are normal. Disc levels: L1-2:  Unremarkable. L2-3: Diffuse degenerative disc bulge with intervertebral disc space narrowing and disc desiccation. Mild bilateral facet and ligamentum flavum hypertrophy. No significant stenosis. L3-4: Mild diffuse circumferential disc bulge. Bilateral facet and ligamentum flavum hypertrophy. Mild bilateral lateral recess narrowing without significant canal stenosis. Foramina are widely patent. L4-5: Minimal annular disc bulge. Mild facet and ligamentum flavum hypertrophy. Mild bilateral lateral recess stenosis. No significant foraminal encroachment. L5-S1: Left subarticular/foraminal disc protrusion extends into the left lateral recess (series 6, image 28). Mass effect on the descending left S1 nerve root which is displaced posteriorly. Mild facet degeneration. No significant canal  or foraminal stenosis. IMPRESSION: 1. Left subarticular/foraminal disc protrusion at L5-S1, extending into the left lateral recess with secondary mass effect on the descending left S1 nerve root. 2. 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 Electronically Signed   By: Jeannine Boga M.D.   On: 12/24/2016 16:56    Assessment & Plan:   There are no diagnoses linked to this encounter. I am having [name] Marks maintain her SUMAtriptan, esomeprazole, estradiol, multivitamin with minerals, traMADol, Vitamin B-12, FLUoxetine, ALPRAZolam, ondansetron, progesterone, Cholecalciferol, FERREX 150, SUMAtriptan, mirtazapine, cyclobenzaprine, D-Mannose, levothyroxine, verapamil, and busPIRone.  No orders of the defined types were placed in this encounter.    Follow-up: No Follow-up on file.  Walker Kehr, MD

## 2017-07-13 NOTE — Assessment & Plan Note (Addendum)
EKG Labs, d-dimer Cardiology ref To ER if worse ASA CT abd/chest - ok in June 2018

## 2017-07-13 NOTE — Assessment & Plan Note (Signed)
Verapamil bid added Maxzide

## 2017-07-13 NOTE — Assessment & Plan Note (Signed)
D-dimer

## 2017-07-14 LAB — D-DIMER, QUANTITATIVE: D-Dimer, Quant: 0.39 mcg/mL FEU (ref <0.50)

## 2017-07-20 ENCOUNTER — Telehealth: Payer: Self-pay | Admitting: Internal Medicine

## 2017-07-20 DIAGNOSIS — R350 Frequency of micturition: Secondary | ICD-10-CM | POA: Diagnosis not present

## 2017-07-20 DIAGNOSIS — N760 Acute vaginitis: Secondary | ICD-10-CM | POA: Diagnosis not present

## 2017-07-20 DIAGNOSIS — N6459 Other signs and symptoms in breast: Secondary | ICD-10-CM | POA: Diagnosis not present

## 2017-07-20 DIAGNOSIS — N390 Urinary tract infection, site not specified: Secondary | ICD-10-CM | POA: Diagnosis not present

## 2017-07-20 MED ORDER — VERAPAMIL HCL ER 120 MG PO TBCR: 120.0000 mg | EXTENDED_RELEASE_TABLET | Freq: Two times a day (BID) | ORAL | 11 refills | Status: DC

## 2017-07-20 NOTE — Telephone Encounter (Signed)
Reviewed chart sent updated rx to [facility]

## 2017-07-20 NOTE — Telephone Encounter (Signed)
Copied from [place]. Topic: Quick Communication - Rx Refill/Question >> Jul 20, 2017 10:18 AM Celedonio Savage L wrote: Medication: verapamil (CALAN-SR) 120 MG CR tablet 90 pills with 11 refills pt states that the dosage changed to taking two pills daily   Has the patient contacted their pharmacy? Yes.     (Agent: If no, request that the patient contact the pharmacy for the refill.)   Preferred Pharmacy (with phone number or street name):   [id number] [address], [place]. [phone] (Phone) [phone] (Fax)     Agent: Please be advised that RX refills may take up to 3 business days. We ask that you follow-up with your pharmacy.

## 2017-07-20 NOTE — Telephone Encounter (Signed)
Last office visit 07/13/17; called  office to verify orders for verapamil; spoke to Sam; see note from 07/11/17 increasing dose to increase to twice daily; will route to office so that correct prescription can be sent to phamacy.

## 2017-07-24 ENCOUNTER — Other Ambulatory Visit: Payer: Self-pay | Admitting: Gastroenterology

## 2017-07-25 NOTE — Progress Notes (Signed)
Cardiology Office Note:    Date:  07/26/2017   ID:  [name], DOB [birth year], MRN [id number]  PCP:  Cassandria Anger, MD  Cardiologist:  Shirlee More, MD   Referring MD: Cassandria Anger, MD  ASSESSMENT:    1. Chest pain in adult   2. Essential hypertension    PLAN:    In order of problems listed above:  1. Her symptoms are concerning for angina, I have asked her to undergo a stress echo and also look for evidence of exercise or post exercise SVT.  For now she will continue current treatment including rate limiting calcium channel blocker 2. Stable continue current medical treatment  Next appointment 4 weeks   Medication Adjustments/Labs and Tests Ordered: Current medicines are reviewed at length with the patient today.  Concerns regarding medicines are outlined above.  Orders Placed This Encounter  Procedures  . ECHOCARDIOGRAM STRESS TEST   No orders of the defined types were placed in this encounter.    Chief Complaint  Patient presents with  . New Patient (Initial Visit)    per Dr Mignon Pine to evaluate chest pain, SHOB, and rapid heart rate  1 month  History of Present Illness:    [name] is a 67 y.o. female who is being seen today for the evaluation of chest pain at the request of Plotnikov, [name], MD.  She was seen as an inpatient [facility] June 2018 when she had syncope and brief SVT in the setting of urosepsis.  Since then she remains very active when she does vigorous activity like housecleaning for hours she notices fatigue rapid heart rate up to 118 bpm blood pressure 190/100 and diaphoresis and weakness.  She is concerned about underlying heart disease other activities did not produce angina she has had some palpitation which she describes as rapid and fast but no documented clinical recurrence of SVT.  She has no known history of congenital rheumatic heart disease and had a normal echocardiogram in 2018 she has not  undergone an ischemia evaluation.  Associated with the episodes of palpitation and shortness of breath she has a vague chest tightness.  There is no radiation it resolves with rest Past Medical History:  Diagnosis Date  . Abdominal pain, generalized 03/26/2014  . Acute upper respiratory infection 05/05/2016   12/17  . Adaptive colitis 12/05/2013  . Adult hypothyroidism 12/05/2013   On Levothroid  . Allergic rhinitis, cause unspecified   . Anemia 05/31/2017   Iron def  . Anxiety   . Asthma    chronic cough  . Bipolar I disorder, most recent episode (or current) depressed, severe, without mention of psychotic behavior   . Blood transfusion without reported diagnosis   . 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 Cephalalgia 12/05/2013  . 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 Chest pain, atypical 07/13/2017   2/19 CT abd/chest - ok in June 2018  . Chronic depression 10/13/2016  . Cold intolerance 03/26/2014  . Concussion w/o coma 09/18/2014  . Cough variant asthma  vs UACS/ irritable larynx  03/26/2014   FENO 08/11/2016  =   17 p no rx    . Deep phlebothrombosis, postpartum 02/14/2008   Overview:  Overview:  Annotation: affected mostly left leg. Qualifier: Diagnosis of  By: Linda Hedges MD, Heinz Knuckles   . Deep phlebothrombosis, postpartum, unspecified as to episode of care(671.40)   . Depression   . Feeling bilious 12/05/2013  . Female genuine stress  incontinence 04/12/2012  . Fibromyalgia   . Gastritis   . GERD 03/04/2007   Protonix   . GERD (gastroesophageal reflux disease)   . HTN (hypertension) 05/31/2017   1/19 Verapamil bid 2/19 added Maxzide  . Hyperlipidemia   . IBS (irritable bowel syndrome)   . 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 Irritable bowel syndrome with diarrhea 09/12/2014   Dr Olevia Perches  . IRRITABLE BOWEL SYNDROME, HX OF 03/04/2007   Qualifier: Diagnosis of  By: Quentin Cornwall CMA, Janett Billow    . Laryngopharyngeal reflux (LPR) 06/15/2016  . Leg pain, anterior, left 12/15/2016    8/18 2/19 L  . 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 Lung mass 09/10/2013  . Lung nodule, solitary 09/10/2013  . Malaise and fatigue 04/16/2015  . Menopause 12/05/2013  . Migraine headache   . Mild cognitive disorder 12/05/2013  . MUSCLE PAIN 07/03/2008   Qualifier: Diagnosis of  By: Jenny Reichmann MD, Hunt Oris   . Other and unspecified hyperlipidemia   . Parathyroid adenoma 2014  . Paresthesia 12/15/2016   8/18 L lat foot - ?peroneal nerve damage  . Positive skin test 03/26/2014  . Post concussion syndrome 10/28/2014  . Severe episode of recurrent major depressive disorder, without psychotic features ([place]) 12/18/2014   On Fluoxetine   . Status post chemotherapy 2014  . Syncope 10/13/2016  . Unspecified hypothyroidism 04/25/2013  . Vitamin D deficiency 04/25/2013    Past Surgical History:  Procedure Laterality Date  . BLADDER SURGERY  06/2016   [facility], piece of mesh with necrotic tissue removed  . CESAREAN SECTION  1986  . CHOLECYSTECTOMY N/A 05/03/2013   Procedure: LAPAROSCOPIC CHOLECYSTECTOMY WITH INTRAOPERATIVE CHOLANGIOGRAM;  Surgeon: Odis Hollingshead, MD;  Location: [place];  Service: General;  Laterality: N/A;  . COLONOSCOPY    . laprotomy  04/2012   for cancer  . NECK SURGERY  2006   C spine fusion  . PUBOVAGINAL SLING    . VAGINAL HYSTERECTOMY      Current Medications: Current Meds  Medication Sig  . ALPRAZolam (XANAX) 0.5 MG tablet Take 1 tablet (0.5 mg total) by mouth 2 (two) times daily as needed for anxiety. (Patient taking differently: Take 0.5 mg by mouth 3 (three) times daily as needed for anxiety. )  . aspirin EC 81 MG tablet Take 2 tablets (162 mg total) by mouth daily.  . busPIRone (BUSPAR) 30 MG tablet TAKE 2/3 TABLET(20MG ) TWICE A DAY  . Cholecalciferol (D3-1000) 1000 units capsule Take 1,000 Units by mouth daily.  . Cyanocobalamin (VITAMIN B-12) 1000 MCG SUBL Place  1 tablet (1,000 mcg total) under the tongue daily.  . cyclobenzaprine (FLEXERIL) 10 MG tablet Take 10 mg by mouth 2 (two) times daily.  . D-Mannose 500 MG CAPS Take 1 capsule by mouth daily.  Marland Kitchen esomeprazole (NEXIUM) 40 MG capsule Take 1 capsule (40 mg total) by mouth 2 (two) times daily before a meal.  . estradiol (ESTRACE) 1 MG tablet Take 1 tablet (1 mg total) by mouth daily.  Marland Kitchen FERREX 150 150 MG capsule TAKE 1 CAPSULE (150 MG TOTAL) BY MOUTH 2 (TWO) TIMES DAILY.  Marland Kitchen FLUoxetine (PROZAC) 40 MG capsule Take 1 capsule (40 mg total) by mouth every morning.  Marland Kitchen levothyroxine (SYNTHROID, LEVOTHROID) 100 MCG tablet Take 1 tablet (100 mcg total) by mouth daily before breakfast.  . mirtazapine (REMERON) 30 MG tablet  Take 1 tablet (30 mg total) by mouth at bedtime. Patient needs office visit before refills will be given  . Multiple Vitamins-Minerals (MULTIVITAMIN WITH MINERALS) tablet Take 1 tablet by mouth daily.  . ondansetron (ZOFRAN-ODT) 4 MG disintegrating tablet Take 4 mg by mouth every 8 (eight) hours as needed for nausea or vomiting.   . progesterone (PROMETRIUM) 100 MG capsule Take 100 mg by mouth at bedtime.  . SUMAtriptan (IMITREX) 100 MG tablet Take 1 tablet (100 mg total) by mouth every 2 (two) hours as needed for migraine. May repeat in 2 hours if headache persists or recurs.  . SUMAtriptan (IMITREX) 6 MG/0.5ML SOLN injection Inject 6 mg into the skin every 2 (two) hours as needed for migraine or headache. Reported on 08/11/2015  . traMADol (ULTRAM) 50 MG tablet Take 50 mg by mouth every 6 (six) hours as needed (for pain).   . triamterene-hydrochlorothiazide (MAXZIDE-25) 37.5-25 MG tablet Take 1 tablet by mouth daily.  . verapamil (CALAN-SR) 120 MG CR tablet Take 1 tablet (120 mg total) by mouth 2 (two) times daily.     Allergies:   Buprenorphine hcl; Morphine and related; Other; Codeine; Dust mite extract; and Molds & smuts   Social History   Socioeconomic History  . Marital status:  Divorced    Spouse name: Not on file  . Number of children: 2  . Years of education: 27  . Highest education level: Not on file  Social Needs  . Financial resource strain: Not on file  . Food insecurity - worry: Not on file  . Food insecurity - inability: Not on file  . Transportation needs - medical: Not on file  . Transportation needs - non-medical: Not on file  Occupational History  . 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 Smoking status: Never Smoker  . Smokeless tobacco: Never Used  Substance and Sexual Activity  . Alcohol use: Yes    Alcohol/week: 1.2 - 1.8 oz    Types: 2 - 3 Standard drinks or equivalent per week    Comment: rarely  . Drug use: No  . Sexual activity: Not Currently    Partners: Male  Other Topics Concern  . Not on file  Social History Narrative   Married 4 years- divorced; married 10 years- divorced. Serially monogamous relationships. Remarried March '11. 2 sons- '82, '87.    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 (June "12).    Lives in her own home.  Currently going through a divorce.     Family History: The patient's family history includes Arthritis in her mother; Coronary artery disease in her father; Diabetes in her maternal grandmother; Heart disease in her father and mother; Hypertension in her father and mother; Irritable bowel syndrome in her brother, father, and sister; Migraines in her mother. There is no history of Colon cancer, Esophageal cancer, Rectal cancer, or Stomach cancer.  ROS:   Review of Systems  Constitution: Negative.  HENT: Negative.   Eyes: Negative.   Cardiovascular: Positive for chest pain, dyspnea on exertion and palpitations.  Respiratory: Positive for shortness of breath.   Endocrine: Negative.   Hematologic/Lymphatic: Negative.   Skin: Negative.   Musculoskeletal: Negative.   Gastrointestinal: Negative.    Genitourinary: Negative.   Neurological: Positive for dizziness.  Psychiatric/Behavioral: Negative.   Allergic/Immunologic: Negative.    Please see the history of present illness.  All other systems reviewed and are negative.  EKGs/Labs/Other Studies Reviewed:    The following studies were reviewed today:  EKG 07/13/17: [facility] NORMAL  Echo TTE 10/15/16:  Study Conclusions - Left ventricle: The cavity size was normal. Wall thickness was   normal. Systolic function was normal. The estimated ejection   fraction was in the range of 60% to 65%. Wall motion was normal;   there were no regional wall motion abnormalities. Left   ventricular diastolic function parameters were normal. - Aortic valve: There was no stenosis. - Mitral valve: There was trivial regurgitation.  CT CHEST 10/19/16: IMPRESSION: Stable exam.  No evidence of recurrent or metastatic sarcoma. Bilateral nonobstructing nephrolithiasis.  Recent Labs: 10/13/2016: Magnesium 1.7 12/29/2016: ALT 11 05/02/2017: TSH 1.62 06/27/2017: Hemoglobin 13.3; Platelets 356 07/13/2017: BUN 25; Creatinine, Ser 1.06; Potassium 4.8; Sodium 138  Recent Lipid Panel    Component Value Date/Time   CHOL 249 (H) 05/05/2016 1056   TRIG 213.0 (H) 05/05/2016 1056   HDL 54.20 05/05/2016 1056   CHOLHDL 5 05/05/2016 1056   VLDL 42.6 (H) 05/05/2016 1056   LDLCALC 96 06/16/2007 1438   LDLDIRECT 161.0 05/05/2016 1056    Physical Exam:    VS:  BP 132/90 (BP Location: Right Arm, Patient Position: Sitting, Cuff Size: Normal)   Pulse 95   Ht 5\' 3"  (1.6 m)   Wt 149 lb (67.6 kg)   SpO2 98%   BMI 26.39 kg/m     Wt Readings from Last 3 Encounters:  07/26/17 149 lb (67.6 kg)  07/13/17 150 lb (68 kg)  05/31/17 149 lb (67.6 kg)     GEN:  Well nourished, well developed in no acute distress HEENT: Normal NECK: No JVD; No carotid bruits LYMPHATICS: No lymphadenopathy CARDIAC: RRR, no murmurs, rubs, gallops RESPIRATORY:  Clear to auscultation  without rales, wheezing or rhonchi  ABDOMEN: Soft, non-tender, non-distended MUSCULOSKELETAL:  No edema; No deformity  SKIN: Warm and dry NEUROLOGIC:  Alert and oriented x 3 PSYCHIATRIC:  Normal affect     Signed, Shirlee More, MD  07/26/2017 12:48 PM    [facility]

## 2017-07-26 ENCOUNTER — Ambulatory Visit: Payer: Medicare HMO | Admitting: Cardiology

## 2017-07-26 ENCOUNTER — Other Ambulatory Visit: Payer: Self-pay | Admitting: Internal Medicine

## 2017-07-26 VITALS — BP 132/90 | HR 95 | Ht 63.0 in | Wt 149.0 lb

## 2017-07-26 DIAGNOSIS — I10 Essential (primary) hypertension: Secondary | ICD-10-CM

## 2017-07-26 DIAGNOSIS — R079 Chest pain, unspecified: Secondary | ICD-10-CM | POA: Diagnosis not present

## 2017-07-26 NOTE — Patient Instructions (Signed)
Medication Instructions:  Your physician recommends that you continue on your current medications as directed. Please refer to the Current Medication list given to you today.  Labwork: None  Testing/Procedures: Your physician has requested that you have a stress echocardiogram. For further information please visit [email or web address]. Please follow instruction sheet as given.  Follow-Up: Your physician recommends that you schedule a follow-up appointment in: 4 weeks.  Any Other Special Instructions Will Be Listed Below (If Applicable).     If you need a refill on your cardiac medications before your next appointment, please call your pharmacy.

## 2017-07-27 ENCOUNTER — Telehealth: Payer: Self-pay

## 2017-07-27 MED ORDER — VERAPAMIL HCL 120 MG PO TABS: 120.0000 mg | ORAL_TABLET | Freq: Two times a day (BID) | ORAL | 0 refills | Status: DC

## 2017-07-27 NOTE — Telephone Encounter (Signed)
Sent in IR version to take instead. Still 1 pill BID.

## 2017-07-27 NOTE — Telephone Encounter (Signed)
Verapamil ER 120 is on back order, suggested alternatives are Verapamil IR  Or Diltiazem.   Please advise in Dr. Enis Slipper absence

## 2017-08-08 ENCOUNTER — Ambulatory Visit (INDEPENDENT_AMBULATORY_CARE_PROVIDER_SITE_OTHER): Payer: Medicare HMO | Admitting: Psychology

## 2017-08-08 DIAGNOSIS — F4321 Adjustment disorder with depressed mood: Secondary | ICD-10-CM | POA: Diagnosis not present

## 2017-08-08 DIAGNOSIS — R69 Illness, unspecified: Secondary | ICD-10-CM | POA: Diagnosis not present

## 2017-08-08 DIAGNOSIS — Z1231 Encounter for screening mammogram for malignant neoplasm of breast: Secondary | ICD-10-CM | POA: Diagnosis not present

## 2017-08-08 DIAGNOSIS — M8589 Other specified disorders of bone density and structure, multiple sites: Secondary | ICD-10-CM | POA: Diagnosis not present

## 2017-08-08 LAB — HM DEXA SCAN

## 2017-08-22 ENCOUNTER — Ambulatory Visit (HOSPITAL_BASED_OUTPATIENT_CLINIC_OR_DEPARTMENT_OTHER)
Admission: RE | Admit: 2017-08-22 | Discharge: 2017-08-22 | Disposition: A | Discharge status: Home / Self Care | Payer: Medicare HMO | Source: Ambulatory Visit | Attending: Cardiology | Admitting: Cardiology

## 2017-08-22 DIAGNOSIS — R079 Chest pain, unspecified: Secondary | ICD-10-CM | POA: Insufficient documentation

## 2017-08-22 DIAGNOSIS — E785 Hyperlipidemia, unspecified: Secondary | ICD-10-CM | POA: Diagnosis not present

## 2017-08-22 DIAGNOSIS — I10 Essential (primary) hypertension: Secondary | ICD-10-CM | POA: Diagnosis not present

## 2017-08-22 NOTE — Progress Notes (Signed)
Cardiology Office Note:    Date:  08/23/2017   ID:  [name], DOB 03-07-1951, MRN [id number]  PCP:  Cassandria Anger, MD  Cardiologist:  Shirlee More, MD    Referring MD: Cassandria Anger, MD    ASSESSMENT:    1. Essential hypertension   2. Chest pain in adult    PLAN:    In order of problems listed above:  1. Blood pressure is not at target repeat by me in the office 148/90 today she is having edema related to calcium channel blocker will discontinue and is to both a beta-blocker with her history of SVT and an ARB.  I asked her to follow-up in 4 weeks if she continues to be symptomatic we will consider a cardiac CTA. 2. Her stress echo shows no evidence of ischemia but she had a marked hypertensive response.  We will optimize blood pressure treatment and if symptoms continue consider cardiac CTA   Next appointment: 4 weeks   Medication Adjustments/Labs and Tests Ordered: Current medicines are reviewed at length with the patient today.  Concerns regarding medicines are outlined above.  No orders of the defined types were placed in this encounter.  No orders of the defined types were placed in this encounter.   Chief Complaint  Patient presents with  . Follow-up    after stress echo    History of Present Illness:    [name] is a 67 y.o. female with a hx of hypertension, SVT and syncope last seen 07/26/17 for chest pain and palpitation.  Stress echo yesterday: Study Conclusions - Stress: There was a normal resting blood pressure with a   hypertensive response to stress. - Stress ECG conclusions: There were no stress arrhythmias or   conduction abnormalities. The stress ECG was normal to 5.4 mets. - Staged echo: Normal echo stress Impressions: - Normal study after maximal exercise . Compliance with diet, lifestyle and medications: Yes Presently she is having dysuria and will seek attention for UTI.  She had a stress echo yesterday  with a diminished exercise tolerance hypertensive blood pressure response but no evidence of ischemia.  Complains of edema related to calcium channel blocker and still feels exercise intolerance and exertional shortness of breath. Past Medical History:  Diagnosis Date  . Abdominal pain, generalized 03/26/2014  . Acute upper respiratory infection 05/05/2016   12/17  . Adaptive colitis 12/05/2013  . Adult hypothyroidism 12/05/2013   On Levothroid  . Allergic rhinitis, cause unspecified   . Anemia 05/31/2017   Iron def  . Anxiety   . Asthma    chronic cough  . Bipolar I disorder, most recent episode (or current) depressed, severe, without mention of psychotic behavior   . Blood transfusion without reported diagnosis   . 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 Cephalalgia 12/05/2013  . 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 Chest pain, atypical 07/13/2017   2/19 CT abd/chest - ok in June 2018  . Chronic depression 10/13/2016  . Cold intolerance 03/26/2014  . Concussion w/o coma 09/18/2014  . Cough variant asthma  vs UACS/ irritable larynx  03/26/2014   FENO 08/11/2016  =   17 p no rx    . Deep phlebothrombosis, postpartum 02/14/2008   Overview:  Overview:  Annotation: affected mostly left leg. Qualifier: Diagnosis of  By: Linda Hedges MD, Heinz Knuckles   . Deep phlebothrombosis, postpartum, unspecified as to episode of care(671.40)   . Depression   . Feeling bilious  12/05/2013  . Female genuine stress incontinence 04/12/2012  . Fibromyalgia   . Gastritis   . GERD 03/04/2007   Protonix   . GERD (gastroesophageal reflux disease)   . HTN (hypertension) 05/31/2017   1/19 Verapamil bid 2/19 added Maxzide  . Hyperlipidemia   . IBS (irritable bowel syndrome)   . 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 Irritable bowel syndrome with diarrhea 09/12/2014   Dr Olevia Perches  . IRRITABLE BOWEL SYNDROME, HX OF 03/04/2007   Qualifier: Diagnosis of  By: Quentin Cornwall CMA, Janett Billow    . Laryngopharyngeal reflux (LPR)  06/15/2016  . Leg pain, anterior, left 12/15/2016   8/18 2/19 L  . 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 Lung mass 09/10/2013  . Lung nodule, solitary 09/10/2013  . Malaise and fatigue 04/16/2015  . Menopause 12/05/2013  . Migraine headache   . Mild cognitive disorder 12/05/2013  . MUSCLE PAIN 07/03/2008   Qualifier: Diagnosis of  By: Jenny Reichmann MD, Hunt Oris   . Other and unspecified hyperlipidemia   . Parathyroid adenoma 2014  . Paresthesia 12/15/2016   8/18 L lat foot - ?peroneal nerve damage  . Positive skin test 03/26/2014  . Post concussion syndrome 10/28/2014  . Severe episode of recurrent major depressive disorder, without psychotic features ([place]) 12/18/2014   On Fluoxetine   . Status post chemotherapy 2014  . Syncope 10/13/2016  . Unspecified hypothyroidism 04/25/2013  . Vitamin D deficiency 04/25/2013    Past Surgical History:  Procedure Laterality Date  . BLADDER SURGERY  06/2016   [facility], piece of mesh with necrotic tissue removed  . CESAREAN SECTION  1986  . CHOLECYSTECTOMY N/A 05/03/2013   Procedure: LAPAROSCOPIC CHOLECYSTECTOMY WITH INTRAOPERATIVE CHOLANGIOGRAM;  Surgeon: Odis Hollingshead, MD;  Location: [place];  Service: General;  Laterality: N/A;  . COLONOSCOPY    . laprotomy  04/2012   for cancer  . NECK SURGERY  2006   C spine fusion  . PUBOVAGINAL SLING    . VAGINAL HYSTERECTOMY      Current Medications: Current Meds  Medication Sig  . ALPRAZolam (XANAX) 0.5 MG tablet Take 1 tablet (0.5 mg total) by mouth 2 (two) times daily as needed for anxiety. (Patient taking differently: Take 0.5 mg by mouth 3 (three) times daily as needed for anxiety. )  . aspirin EC 81 MG tablet Take 81 mg by mouth daily.  . busPIRone (BUSPAR) 30 MG tablet TAKE 2/3 TABLET(20MG ) TWICE A DAY  . Cholecalciferol (D3-1000) 1000 units capsule Take 1,000 Units by mouth daily.  . Cyanocobalamin  (VITAMIN B-12) 1000 MCG SUBL Place 1 tablet (1,000 mcg total) under the tongue daily.  . cyclobenzaprine (FLEXERIL) 10 MG tablet Take 10 mg by mouth 2 (two) times daily.  . D-Mannose 500 MG CAPS Take 1 capsule by mouth daily.  Marland Kitchen esomeprazole (NEXIUM) 40 MG capsule Take 1 capsule (40 mg total) by mouth 2 (two) times daily before a meal.  . estradiol (ESTRACE) 1 MG tablet Take 1 tablet (1 mg total) by mouth daily.  Marland Kitchen FERREX 150 150 MG capsule TAKE 1 CAPSULE (150 MG TOTAL) BY MOUTH 2 (TWO) TIMES DAILY.  Marland Kitchen FLUoxetine (PROZAC) 40 MG capsule Take 1 capsule (40 mg total) by mouth every morning.  Marland Kitchen levothyroxine (SYNTHROID, LEVOTHROID) 100 MCG tablet Take 1 tablet (100 mcg total) by mouth daily before breakfast.  . mirtazapine (REMERON)  30 MG tablet Take 1 tablet (30 mg total) by mouth at bedtime. Patient needs office visit before refills will be given  . Multiple Vitamins-Minerals (MULTIVITAMIN WITH MINERALS) tablet Take 1 tablet by mouth daily.  . ondansetron (ZOFRAN-ODT) 4 MG disintegrating tablet Take 4 mg by mouth every 8 (eight) hours as needed for nausea or vomiting.   . progesterone (PROMETRIUM) 100 MG capsule Take 100 mg by mouth at bedtime.  . SUMAtriptan (IMITREX) 100 MG tablet Take 1 tablet (100 mg total) by mouth every 2 (two) hours as needed for migraine. May repeat in 2 hours if headache persists or recurs.  . SUMAtriptan (IMITREX) 6 MG/0.5ML SOLN injection Inject 6 mg into the skin every 2 (two) hours as needed for migraine or headache. Reported on 08/11/2015  . traMADol (ULTRAM) 50 MG tablet Take 50 mg by mouth every 6 (six) hours as needed (for pain).   . triamterene-hydrochlorothiazide (MAXZIDE-25) 37.5-25 MG tablet Take 1 tablet by mouth daily.  . verapamil (CALAN) 120 MG tablet Take 1 tablet (120 mg total) by mouth 2 (two) times daily.     Allergies:   Buprenorphine hcl; Morphine and related; Other; Codeine; Dust mite extract; and Molds & smuts   Social History   Socioeconomic  History  . Marital status: Divorced    Spouse name: Not on file  . Number of children: 2  . Years of education: 65  . Highest education level: Not on file  Occupational History  . 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 Financial resource strain: Not on file  . Food insecurity:    Worry: Not on file    Inability: Not on file  . Transportation needs:    Medical: Not on file    Non-medical: Not on file  Tobacco Use  . Smoking status: Never Smoker  . Smokeless tobacco: Never Used  Substance and Sexual Activity  . Alcohol use: Yes    Alcohol/week: 1.2 - 1.8 oz    Types: 2 - 3 Standard drinks or equivalent per week    Comment: rarely  . Drug use: No  . Sexual activity: Not Currently    Partners: Male  Lifestyle  . Physical activity:    Days per week: Not on file    Minutes per session: Not on file  . Stress: Not on file  Relationships  . Social connections:    Talks on phone: Not on file    Gets together: Not on file    Attends religious service: Not on file    Active member of club or organization: Not on file    Attends meetings of clubs or organizations: Not on file    Relationship status: Not on file  Other Topics Concern  . Not on file  Social History Narrative   Married 4 years- divorced; married 10 years- divorced. Serially monogamous relationships. Remarried March '11. 2 sons- '82, '87.    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 (June "12).    Lives in her own home.  Currently going through a divorce.     Family History: The patient's family history includes Arthritis in her mother; Coronary artery disease in her father; Diabetes in her maternal grandmother; Heart disease in her father and mother; Hypertension in her father and mother; Irritable bowel syndrome in her brother, father, and sister; Migraines in her mother. There is no history of Colon  cancer, Esophageal cancer, Rectal cancer, or Stomach cancer. ROS:   Please see the history of present illness.    All other systems reviewed and are negative.  EKGs/Labs/Other Studies Reviewed:    The following studies were reviewed today:  Recent Labs: 10/13/2016: Magnesium 1.7 12/29/2016: ALT 11 05/02/2017: TSH 1.62 06/27/2017: Hemoglobin 13.3; Platelets 356 07/13/2017: BUN 25; Creatinine, Ser 1.06; Potassium 4.8; Sodium 138  Recent Lipid Panel    Component Value Date/Time   CHOL 249 (H) 05/05/2016 1056   TRIG 213.0 (H) 05/05/2016 1056   HDL 54.20 05/05/2016 1056   CHOLHDL 5 05/05/2016 1056   VLDL 42.6 (H) 05/05/2016 1056   LDLCALC 96 06/16/2007 1438   LDLDIRECT 161.0 05/05/2016 1056    Physical Exam:    VS:  BP 132/90 (BP Location: Right Arm, Patient Position: Sitting, Cuff Size: Normal)   Pulse 84   Ht 5\' 3"  (1.6 m)   Wt 152 lb 12.8 oz (69.3 kg)   SpO2 97%   BMI 27.07 kg/m     Wt Readings from Last 3 Encounters:  08/23/17 152 lb 12.8 oz (69.3 kg)  07/26/17 149 lb (67.6 kg)  07/13/17 150 lb (68 kg)     GEN:  Well nourished, well developed in no acute distress HEENT: Normal NECK: No JVD; No carotid bruits LYMPHATICS: No lymphadenopathy CARDIAC: RRR, no murmurs, rubs, gallops RESPIRATORY:  Clear to auscultation without rales, wheezing or rhonchi  ABDOMEN: Soft, non-tender, non-distended MUSCULOSKELETAL:  No edema; No deformity  SKIN: Warm and dry NEUROLOGIC:  Alert and oriented x 3 PSYCHIATRIC:  Normal affect    Signed, Shirlee More, MD  08/23/2017 11:30 AM    [facility]

## 2017-08-22 NOTE — Progress Notes (Signed)
Echocardiogram Echocardiogram Stress Test has been performed.  Joelene Millin 08/22/2017, 3:20 PM

## 2017-08-23 ENCOUNTER — Ambulatory Visit: Payer: Medicare HMO | Admitting: Cardiology

## 2017-08-23 VITALS — BP 132/90 | HR 84 | Ht 63.0 in | Wt 152.8 lb

## 2017-08-23 DIAGNOSIS — E7849 Other hyperlipidemia: Secondary | ICD-10-CM | POA: Diagnosis not present

## 2017-08-23 DIAGNOSIS — R079 Chest pain, unspecified: Secondary | ICD-10-CM

## 2017-08-23 DIAGNOSIS — I10 Essential (primary) hypertension: Secondary | ICD-10-CM | POA: Diagnosis not present

## 2017-08-23 MED ORDER — NEBIVOLOL HCL 5 MG PO TABS: 5.0000 mg | ORAL_TABLET | Freq: Every day | ORAL | 5 refills | Status: DC

## 2017-08-23 MED ORDER — TELMISARTAN 20 MG PO TABS: 20.0000 mg | ORAL_TABLET | Freq: Every day | ORAL | 3 refills | Status: DC

## 2017-08-23 NOTE — Patient Instructions (Addendum)
Medication Instructions:  Your physician has recommended you make the following change in your medication:  START nebivolol (Bystolic) 5 mg daily START telmisartan (Micardis) 20 mg daily  Labwork: None  Testing/Procedures: None  Follow-Up: Your physician recommends that you schedule a follow-up appointment in: 4 weeks.  Any Other Special Instructions Will Be Listed Below (If Applicable).     If you need a refill on your cardiac medications before your next appointment, please call your pharmacy.

## 2017-08-24 ENCOUNTER — Ambulatory Visit: Payer: Medicare HMO | Admitting: Psychology

## 2017-08-24 ENCOUNTER — Other Ambulatory Visit: Payer: Self-pay | Admitting: Internal Medicine

## 2017-08-25 ENCOUNTER — Encounter (HOSPITAL_COMMUNITY): Payer: Self-pay

## 2017-08-25 ENCOUNTER — Other Ambulatory Visit: Payer: Self-pay

## 2017-08-25 ENCOUNTER — Observation Stay (HOSPITAL_COMMUNITY)
Admission: EM | Admit: 2017-08-25 | Discharge: 2017-08-26 | Disposition: A | Discharge status: Home / Self Care | Payer: Medicare HMO | Attending: Cardiology | Admitting: Cardiology

## 2017-08-25 ENCOUNTER — Emergency Department (HOSPITAL_COMMUNITY): Payer: Medicare HMO

## 2017-08-25 DIAGNOSIS — E782 Mixed hyperlipidemia: Secondary | ICD-10-CM | POA: Insufficient documentation

## 2017-08-25 DIAGNOSIS — N183 Chronic kidney disease, stage 3 (moderate): Secondary | ICD-10-CM | POA: Insufficient documentation

## 2017-08-25 DIAGNOSIS — Z8542 Personal history of malignant neoplasm of other parts of uterus: Secondary | ICD-10-CM | POA: Insufficient documentation

## 2017-08-25 DIAGNOSIS — R0789 Other chest pain: Secondary | ICD-10-CM | POA: Insufficient documentation

## 2017-08-25 DIAGNOSIS — Z8249 Family history of ischemic heart disease and other diseases of the circulatory system: Secondary | ICD-10-CM | POA: Diagnosis not present

## 2017-08-25 DIAGNOSIS — I200 Unstable angina: Secondary | ICD-10-CM

## 2017-08-25 DIAGNOSIS — R0609 Other forms of dyspnea: Secondary | ICD-10-CM | POA: Diagnosis not present

## 2017-08-25 DIAGNOSIS — Z885 Allergy status to narcotic agent status: Secondary | ICD-10-CM | POA: Diagnosis not present

## 2017-08-25 DIAGNOSIS — R079 Chest pain, unspecified: Secondary | ICD-10-CM | POA: Diagnosis not present

## 2017-08-25 DIAGNOSIS — R69 Illness, unspecified: Secondary | ICD-10-CM | POA: Diagnosis not present

## 2017-08-25 DIAGNOSIS — N179 Acute kidney failure, unspecified: Secondary | ICD-10-CM | POA: Diagnosis not present

## 2017-08-25 DIAGNOSIS — K219 Gastro-esophageal reflux disease without esophagitis: Secondary | ICD-10-CM | POA: Diagnosis not present

## 2017-08-25 DIAGNOSIS — F314 Bipolar disorder, current episode depressed, severe, without psychotic features: Secondary | ICD-10-CM | POA: Diagnosis not present

## 2017-08-25 DIAGNOSIS — I10 Essential (primary) hypertension: Secondary | ICD-10-CM | POA: Diagnosis not present

## 2017-08-25 DIAGNOSIS — J45909 Unspecified asthma, uncomplicated: Secondary | ICD-10-CM | POA: Diagnosis not present

## 2017-08-25 DIAGNOSIS — N393 Stress incontinence (female) (male): Secondary | ICD-10-CM | POA: Diagnosis not present

## 2017-08-25 DIAGNOSIS — E039 Hypothyroidism, unspecified: Secondary | ICD-10-CM | POA: Insufficient documentation

## 2017-08-25 DIAGNOSIS — E559 Vitamin D deficiency, unspecified: Secondary | ICD-10-CM | POA: Insufficient documentation

## 2017-08-25 DIAGNOSIS — Z7982 Long term (current) use of aspirin: Secondary | ICD-10-CM | POA: Insufficient documentation

## 2017-08-25 DIAGNOSIS — G43909 Migraine, unspecified, not intractable, without status migrainosus: Secondary | ICD-10-CM | POA: Insufficient documentation

## 2017-08-25 DIAGNOSIS — K589 Irritable bowel syndrome without diarrhea: Secondary | ICD-10-CM | POA: Diagnosis not present

## 2017-08-25 DIAGNOSIS — R0600 Dyspnea, unspecified: Secondary | ICD-10-CM | POA: Diagnosis present

## 2017-08-25 DIAGNOSIS — M797 Fibromyalgia: Secondary | ICD-10-CM | POA: Insufficient documentation

## 2017-08-25 DIAGNOSIS — I129 Hypertensive chronic kidney disease with stage 1 through stage 4 chronic kidney disease, or unspecified chronic kidney disease: Secondary | ICD-10-CM | POA: Insufficient documentation

## 2017-08-25 HISTORY — DX: Dyspnea, unspecified: R06.00

## 2017-08-25 HISTORY — DX: Other forms of dyspnea: R06.09

## 2017-08-25 LAB — CBC
HCT: 38.4 % (ref 36.0–46.0)
HCT: 39.9 % (ref 36.0–46.0)
Hemoglobin: 12.4 g/dL (ref 12.0–15.0)
Hemoglobin: 13 g/dL (ref 12.0–15.0)
MCH: 30 pg (ref 26.0–34.0)
MCH: 30 pg (ref 26.0–34.0)
MCHC: 32.3 g/dL (ref 30.0–36.0)
MCHC: 32.6 g/dL (ref 30.0–36.0)
MCV: 92.8 fL (ref 78.0–100.0)
MCV: 92.1 fL (ref 78.0–100.0)
Platelets: 342 10*3/uL (ref 150–400)
Platelets: 322 10*3/uL (ref 150–400)
RBC: 4.14 MIL/uL (ref 3.87–5.11)
RBC: 4.33 MIL/uL (ref 3.87–5.11)
RDW: 14.3 % (ref 11.5–15.5)
RDW: 14.1 % (ref 11.5–15.5)
WBC: 7.9 10*3/uL (ref 4.0–10.5)
WBC: 8.2 10*3/uL (ref 4.0–10.5)

## 2017-08-25 LAB — BASIC METABOLIC PANEL
Anion gap: 11 (ref 5–15)
BUN: 17 mg/dL (ref 6–20)
CO2: 22 mmol/L (ref 22–32)
Calcium: 9.9 mg/dL (ref 8.9–10.3)
Chloride: 101 mmol/L (ref 101–111)
Creatinine, Ser: 1.22 mg/dL — ABNORMAL HIGH (ref 0.44–1.00)
GFR calc Af Amer: 52 mL/min — ABNORMAL LOW (ref >60)
GFR calc non Af Amer: 45 mL/min — ABNORMAL LOW (ref >60)
Glucose, Bld: 75 mg/dL (ref 65–99)
Potassium: 4.3 mmol/L (ref 3.5–5.1)
Sodium: 134 mmol/L — ABNORMAL LOW (ref 135–145)

## 2017-08-25 LAB — HEPATIC FUNCTION PANEL
ALT: 18 U/L (ref 14–54)
AST: 25 U/L (ref 15–41)
Albumin: 3.6 g/dL (ref 3.5–5.0)
Alkaline Phosphatase: 96 U/L (ref 38–126)
Bilirubin, Direct: <0.1 mg/dL — ABNORMAL LOW (ref 0.1–0.5)
Total Bilirubin: 0.5 mg/dL (ref 0.3–1.2)
Total Protein: 6.6 g/dL (ref 6.5–8.1)

## 2017-08-25 LAB — TROPONIN I: Troponin I: <0.03 ng/mL (ref <0.03)

## 2017-08-25 LAB — I-STAT TROPONIN, ED: Troponin i, poc: 0 ng/mL (ref 0.00–0.08)

## 2017-08-25 LAB — URINALYSIS, ROUTINE W REFLEX MICROSCOPIC
Bilirubin Urine: NEGATIVE
Glucose, UA: NEGATIVE mg/dL
Ketones, ur: NEGATIVE mg/dL
Leukocytes, UA: NEGATIVE
Nitrite: NEGATIVE
Protein, ur: NEGATIVE mg/dL
Specific Gravity, Urine: 1.005 (ref 1.005–1.030)
pH: 7 (ref 5.0–8.0)

## 2017-08-25 LAB — CREATININE, SERUM
Creatinine, Ser: 1.22 mg/dL — ABNORMAL HIGH (ref 0.44–1.00)
GFR calc Af Amer: 52 mL/min — ABNORMAL LOW (ref >60)
GFR calc non Af Amer: 45 mL/min — ABNORMAL LOW (ref >60)

## 2017-08-25 LAB — LIPASE, BLOOD: Lipase: 51 U/L (ref 11–51)

## 2017-08-25 LAB — BRAIN NATRIURETIC PEPTIDE: B Natriuretic Peptide: 53.8 pg/mL (ref 0.0–100.0)

## 2017-08-25 LAB — D-DIMER, QUANTITATIVE: D-Dimer, Quant: 0.39 ug/mL-FEU (ref 0.00–0.50)

## 2017-08-25 MED ORDER — FLUOXETINE HCL 20 MG PO CAPS
40.0000 mg | ORAL_CAPSULE | Freq: Every morning | ORAL | Status: DC
  Administered 2017-08-26: 40 mg via ORAL
  Filled 2017-08-25: qty 2

## 2017-08-25 MED ORDER — SODIUM CHLORIDE 0.9 % IV SOLN: 250.0000 mL | INTRAVENOUS | Status: DC | PRN

## 2017-08-25 MED ORDER — ONDANSETRON 4 MG PO TBDP: 4.0000 mg | ORAL_TABLET | Freq: Three times a day (TID) | ORAL | Status: DC | PRN

## 2017-08-25 MED ORDER — VITAMIN B-12 1000 MCG PO TABS
1000.0000 ug | ORAL_TABLET | Freq: Every day | ORAL | Status: DC
  Administered 2017-08-26: 1000 ug via ORAL
  Filled 2017-08-25: qty 1

## 2017-08-25 MED ORDER — SUMATRIPTAN SUCCINATE 6 MG/0.5ML ~~LOC~~ SOLN
6.0000 mg | SUBCUTANEOUS | Status: DC | PRN
  Filled 2017-08-25: qty 0.5

## 2017-08-25 MED ORDER — ASPIRIN 300 MG RE SUPP: 300.0000 mg | RECTAL | Status: AC

## 2017-08-25 MED ORDER — NEBIVOLOL HCL 2.5 MG PO TABS
2.5000 mg | ORAL_TABLET | Freq: Every day | ORAL | Status: DC
  Administered 2017-08-26: 2.5 mg via ORAL
  Filled 2017-08-25: qty 1

## 2017-08-25 MED ORDER — IRBESARTAN 75 MG PO TABS
75.0000 mg | ORAL_TABLET | Freq: Every day | ORAL | Status: DC
  Administered 2017-08-26: 75 mg via ORAL
  Filled 2017-08-25: qty 1

## 2017-08-25 MED ORDER — SODIUM CHLORIDE 0.9 % WEIGHT BASED INFUSION
1.0000 mL/kg/h | INTRAVENOUS | Status: DC
  Administered 2017-08-25: 1 mL/kg/h via INTRAVENOUS

## 2017-08-25 MED ORDER — D-MANNOSE 500 MG PO CAPS: 1.0000 | ORAL_CAPSULE | Freq: Every day | ORAL | Status: DC

## 2017-08-25 MED ORDER — ACETAMINOPHEN 325 MG PO TABS: 650.0000 mg | ORAL_TABLET | ORAL | Status: DC | PRN

## 2017-08-25 MED ORDER — TRAMADOL HCL 50 MG PO TABS: 50.0000 mg | ORAL_TABLET | Freq: Four times a day (QID) | ORAL | Status: DC | PRN

## 2017-08-25 MED ORDER — ASPIRIN 81 MG PO CHEW
81.0000 mg | CHEWABLE_TABLET | ORAL | Status: AC
  Administered 2017-08-26: 81 mg via ORAL
  Filled 2017-08-25: qty 1

## 2017-08-25 MED ORDER — ASPIRIN 81 MG PO CHEW
324.0000 mg | CHEWABLE_TABLET | ORAL | Status: AC
  Administered 2017-08-25: 324 mg via ORAL
  Filled 2017-08-25: qty 4

## 2017-08-25 MED ORDER — VITAMIN D 1000 UNITS PO TABS
1000.0000 [IU] | ORAL_TABLET | Freq: Every day | ORAL | Status: DC
  Administered 2017-08-26: 1000 [IU] via ORAL
  Filled 2017-08-25: qty 1

## 2017-08-25 MED ORDER — SODIUM CHLORIDE 0.9% FLUSH: 3.0000 mL | INTRAVENOUS | Status: DC | PRN

## 2017-08-25 MED ORDER — PROGESTERONE MICRONIZED 100 MG PO CAPS
100.0000 mg | ORAL_CAPSULE | Freq: Every day | ORAL | Status: DC
  Administered 2017-08-25: 100 mg via ORAL
  Filled 2017-08-25 (×2): qty 1

## 2017-08-25 MED ORDER — ALPRAZOLAM 0.5 MG PO TABS: 0.5000 mg | ORAL_TABLET | Freq: Three times a day (TID) | ORAL | Status: DC | PRN

## 2017-08-25 MED ORDER — SODIUM CHLORIDE 0.9% FLUSH
3.0000 mL | Freq: Two times a day (BID) | INTRAVENOUS | Status: DC
  Administered 2017-08-25 – 2017-08-26 (×2): 3 mL via INTRAVENOUS

## 2017-08-25 MED ORDER — SODIUM CHLORIDE 0.9 % WEIGHT BASED INFUSION: 3.0000 mL/kg/h | INTRAVENOUS | Status: AC

## 2017-08-25 MED ORDER — SUMATRIPTAN SUCCINATE 100 MG PO TABS
100.0000 mg | ORAL_TABLET | ORAL | Status: DC | PRN
  Administered 2017-08-26: 100 mg via ORAL
  Filled 2017-08-25 (×2): qty 1

## 2017-08-25 MED ORDER — ADULT MULTIVITAMIN W/MINERALS CH
1.0000 | ORAL_TABLET | Freq: Every day | ORAL | Status: DC
  Administered 2017-08-26: 1 via ORAL
  Filled 2017-08-25: qty 1

## 2017-08-25 MED ORDER — CYCLOBENZAPRINE HCL 10 MG PO TABS
10.0000 mg | ORAL_TABLET | Freq: Two times a day (BID) | ORAL | Status: DC
Start: 2017-08-25 — End: 2017-08-26
  Administered 2017-08-25 – 2017-08-26 (×2): 10 mg via ORAL
  Filled 2017-08-25 (×2): qty 1

## 2017-08-25 MED ORDER — BUSPIRONE HCL 5 MG PO TABS
15.0000 mg | ORAL_TABLET | Freq: Two times a day (BID) | ORAL | Status: DC
  Administered 2017-08-25: 15 mg via ORAL
  Filled 2017-08-25: qty 1

## 2017-08-25 MED ORDER — LEVOTHYROXINE SODIUM 100 MCG PO TABS
100.0000 ug | ORAL_TABLET | Freq: Every day | ORAL | Status: DC
  Administered 2017-08-26: 100 ug via ORAL
  Filled 2017-08-25: qty 1

## 2017-08-25 MED ORDER — PANTOPRAZOLE SODIUM 40 MG PO TBEC
40.0000 mg | DELAYED_RELEASE_TABLET | Freq: Every day | ORAL | Status: DC
  Administered 2017-08-25 – 2017-08-26 (×2): 40 mg via ORAL
  Filled 2017-08-25 (×2): qty 1

## 2017-08-25 MED ORDER — NITROGLYCERIN 0.4 MG SL SUBL: 0.4000 mg | SUBLINGUAL_TABLET | SUBLINGUAL | Status: DC | PRN

## 2017-08-25 MED ORDER — TRIAMTERENE-HCTZ 37.5-25 MG PO TABS
1.0000 | ORAL_TABLET | Freq: Every day | ORAL | Status: DC
  Administered 2017-08-26: 1 via ORAL
  Filled 2017-08-25: qty 1

## 2017-08-25 MED ORDER — HEPARIN SODIUM (PORCINE) 5000 UNIT/ML IJ SOLN
5000.0000 [IU] | Freq: Three times a day (TID) | INTRAMUSCULAR | Status: DC
  Administered 2017-08-25 – 2017-08-26 (×2): 5000 [IU] via SUBCUTANEOUS
  Filled 2017-08-25 (×2): qty 1

## 2017-08-25 MED ORDER — POLYSACCHARIDE IRON COMPLEX 150 MG PO CAPS
150.0000 mg | ORAL_CAPSULE | Freq: Two times a day (BID) | ORAL | Status: DC
  Administered 2017-08-25: 150 mg via ORAL
  Filled 2017-08-25: qty 1

## 2017-08-25 MED ORDER — ESTRADIOL 1 MG PO TABS
1.0000 mg | ORAL_TABLET | Freq: Every day | ORAL | Status: DC
  Administered 2017-08-26: 1 mg via ORAL
  Filled 2017-08-25: qty 1

## 2017-08-25 MED ORDER — ONDANSETRON HCL 4 MG/2ML IJ SOLN: 4.0000 mg | Freq: Four times a day (QID) | INTRAMUSCULAR | Status: DC | PRN

## 2017-08-25 MED ORDER — ASPIRIN EC 81 MG PO TBEC
81.0000 mg | DELAYED_RELEASE_TABLET | Freq: Every day | ORAL | Status: DC
  Administered 2017-08-26: 81 mg via ORAL
  Filled 2017-08-25: qty 1

## 2017-08-25 MED ORDER — ONDANSETRON 4 MG PO TBDP
4.0000 mg | ORAL_TABLET | Freq: Once | ORAL | Status: AC | PRN
  Administered 2017-08-25: 4 mg via ORAL
  Filled 2017-08-25: qty 1

## 2017-08-25 MED ORDER — MIRTAZAPINE 15 MG PO TABS
30.0000 mg | ORAL_TABLET | Freq: Every day | ORAL | Status: DC
  Administered 2017-08-25: 30 mg via ORAL
  Filled 2017-08-25: qty 2
  Filled 2017-08-25 (×2): qty 1

## 2017-08-25 NOTE — ED Notes (Signed)
Cardiology at bedside.

## 2017-08-25 NOTE — ED Notes (Signed)
Lab to add on hepatic function and BNP

## 2017-08-25 NOTE — ED Triage Notes (Signed)
Pt presents for evaluation of central chest tightness x 2 days. Pt reports associated sob and pressure in chest. Pt reports hx of htn, had stress echo 3 days ago and was started on two new medications. Pt reports pain is getting much worse. Dizzy, nausea as well.

## 2017-08-25 NOTE — ED Provider Notes (Signed)
[facility] Provider Note   CSN: [id number] Arrival date & time: 08/25/17  1414     History   Chief Complaint Chief Complaint  Patient presents with  . Chest Pain    HPI [name] Luetta Nutting is a 67 y.o. female.  Patient is a 67 year old female with a history of atypical chest pain, hypertension, bipolar disease, asthma, hyperlipidemia who is presenting today with worsening shortness of breath on exertion, newly developed chest pain on exertion, 10 pound weight gain and general fatigue.  Patient is under the care of Dr. Steward Ros with cardiology who is recently been seeing her for these above issues.  Patient states that she had been on verapamil for hypertension that started 2 months ago but was having swelling.  She stopped the verapamil and started on Bystolic and Micardis on Tuesday and states since that time she is continually getting worse.  Patient had a stress test done on Monday but due to hypertension she could not complete it.  She states when she was walking she started getting a headache, nausea, shortness of breath and could not continue.  Per cardiology notes there was no inducible ischemia however she was not able to complete the test.  Patient states that her symptoms have seemed to worsen since starting the new medications.  She was extremely fatigued this morning and states she just felt like she could not get up and move around.  Today was the first day she developed a chest tightness that lasted 1-2 hours and was better with rest.  She currently is having no chest pain or shortness of breath.  She denies a cough, fever but had had dysuria over the last week.  Patient states that she has ongoing issues with urinary tract infections after multiple surgeries and slings.  Patient takes preventative antibiotic and also started Bactrim 3 days ago because she felt that she was having a urinary tract infection.  She denies any vomiting or  diarrhea.  The history is provided by the patient.    Past Medical History:  Diagnosis Date  . Abdominal pain, generalized 03/26/2014  . Acute upper respiratory infection 05/05/2016   12/17  . Adaptive colitis 12/05/2013  . Adult hypothyroidism 12/05/2013   On Levothroid  . Allergic rhinitis, cause unspecified   . Anemia 05/31/2017   Iron def  . Anxiety   . Asthma    chronic cough  . Bipolar I disorder, most recent episode (or current) depressed, severe, without mention of psychotic behavior   . Blood transfusion without reported diagnosis   . 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 Cephalalgia 12/05/2013  . 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 Chest pain, atypical 07/13/2017   2/19 CT abd/chest - ok in June 2018  . Chronic depression 10/13/2016  . Cold intolerance 03/26/2014  . Concussion w/o coma 09/18/2014  . Cough variant asthma  vs UACS/ irritable larynx  03/26/2014   FENO 08/11/2016  =   17 p no rx    . Deep phlebothrombosis, postpartum 02/14/2008   Overview:  Overview:  Annotation: affected mostly left leg. Qualifier: Diagnosis of  By: Linda Hedges MD, Heinz Knuckles   . Deep phlebothrombosis, postpartum, unspecified as to episode of care(671.40)   . Depression   . Feeling bilious 12/05/2013  . Female genuine stress incontinence 04/12/2012  . Fibromyalgia   . Gastritis   . GERD 03/04/2007   Protonix   . GERD (gastroesophageal reflux disease)   . HTN (hypertension)  05/31/2017   1/19 Verapamil bid 2/19 added Maxzide  . Hyperlipidemia   . IBS (irritable bowel syndrome)   . 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 Irritable bowel syndrome with diarrhea 09/12/2014   Dr Olevia Perches  . IRRITABLE BOWEL SYNDROME, HX OF 03/04/2007   Qualifier: Diagnosis of  By: Quentin Cornwall CMA, Janett Billow    . Laryngopharyngeal reflux (LPR) 06/15/2016  . Leg pain, anterior, left 12/15/2016   8/18 2/19 L  . 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 Lung mass 09/10/2013  . Lung nodule, solitary 09/10/2013  . Malaise and fatigue 04/16/2015  . Menopause 12/05/2013  . Migraine headache   . Mild cognitive disorder 12/05/2013  . MUSCLE PAIN 07/03/2008   Qualifier: Diagnosis of  By: Jenny Reichmann MD, Hunt Oris   . Other and unspecified hyperlipidemia   . Parathyroid adenoma 2014  . Paresthesia 12/15/2016   8/18 L lat foot - ?peroneal nerve damage  . Positive skin test 03/26/2014  . Post concussion syndrome 10/28/2014  . Severe episode of recurrent major depressive disorder, without psychotic features ([place]) 12/18/2014   On Fluoxetine   . Status post chemotherapy 2014  . Syncope 10/13/2016  . Unspecified hypothyroidism 04/25/2013  . Vitamin D deficiency 04/25/2013    Patient Active Problem List   Diagnosis Date Noted  . Chest pain in adult 07/13/2017  . HTN (hypertension) 05/31/2017  . Anemia 05/31/2017  . Leg pain, anterior, left 12/15/2016  . Paresthesia 12/15/2016  . Syncope 10/13/2016  . Migraine headache 10/13/2016  . Asthma 10/13/2016  . Anxiety 10/13/2016  . Hyperlipidemia 10/13/2016  . UTI (urinary tract infection) 10/13/2016  . CKD (chronic kidney disease) stage 3, GFR 30-59 ml/min (HCC) 10/13/2016  . Chronic depression 10/13/2016  . Hypothyroidism 10/13/2016  . Laryngopharyngeal reflux (LPR) 06/15/2016  . Acute upper respiratory infection 05/05/2016  . Degenerative disc disease, cervical 11/29/2015  . IBS (irritable bowel syndrome) 11/24/2015  . Facet arthritis of cervical region 10/16/2015  . Chronic urinary tract infection 07/22/2015  . Malaise and fatigue 04/16/2015  . Severe episode of recurrent major depressive disorder, without psychotic features ([place]) 12/18/2014  . Post concussion syndrome 10/28/2014  . 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 Depression 10/25/2014  . Major depressive disorder, recurrent, severe without psychotic features ([place])   . Cervical nerve root disorder  09/18/2014  . Concussion w/o coma 09/18/2014  . Irritable bowel syndrome with diarrhea 09/12/2014  . 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 Cold intolerance 03/26/2014  . Cough variant asthma  vs UACS/ irritable larynx  03/26/2014  . Abdominal pain, generalized 03/26/2014  . Positive skin test 03/26/2014  . Adjustment disorder with anxiety 02/07/2014  . Cervical pain 12/05/2013  . Fibromyalgia 12/05/2013  . Cephalalgia 12/05/2013  . Adult hypothyroidism 12/05/2013  . Adaptive colitis 12/05/2013  . Menopause 12/05/2013  . Mild cognitive disorder 12/05/2013  . Feeling bilious 12/05/2013  . Leiomyosarcoma ([place]) 09/10/2013  . Lung mass 09/10/2013  . Lung nodule, solitary 09/10/2013  . Abnormal chest x-ray   . Unspecified hypothyroidism 04/25/2013  . Vitamin D deficiency 04/25/2013  . 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 Female genuine stress incontinence 04/12/2012  . Healthcare maintenance 10/30/2010  . MUSCLE PAIN 07/03/2008  . BIPLR I MOST RECENT EPIS DPRS SEV NO PSYCHOT [facility] 02/14/2008  . DEEP PHLEBOTHROMBOSIS PP UNSPEC AS  EPIS CARE 02/14/2008  . Bipolar affective disorder, current episode depressed ([place]) 02/14/2008  . Deep phlebothrombosis, postpartum 02/14/2008  . HYPERLIPIDEMIA 03/04/2007  . ALLERGIC RHINITIS 03/04/2007  . GERD 03/04/2007  . IRRITABLE BOWEL SYNDROME, HX OF 03/04/2007    Past Surgical History:  Procedure Laterality Date  . BLADDER SURGERY  06/2016   [facility], piece of mesh with necrotic tissue removed  . CESAREAN SECTION  1986  . CHOLECYSTECTOMY N/A 05/03/2013   Procedure: LAPAROSCOPIC CHOLECYSTECTOMY WITH INTRAOPERATIVE CHOLANGIOGRAM;  Surgeon: Odis Hollingshead, MD;  Location: [facility];  Service: General;  Laterality: N/A;  . COLONOSCOPY    . laprotomy  04/2012   for cancer  . NECK SURGERY  2006   C spine fusion  . PUBOVAGINAL SLING    . VAGINAL HYSTERECTOMY       OB History    None      Home Medications    Prior to Admission medications   Medication Sig Start Date End Date Taking? Authorizing Provider  ALPRAZolam Duanne Moron) 0.5 MG tablet Take 1 tablet (0.5 mg total) by mouth 2 (two) times daily as needed for anxiety. Patient taking differently: Take 0.5 mg by mouth 3 (three) times daily as needed for anxiety.  08/11/16   Plotnikov, [name], MD  aspirin EC 81 MG tablet Take 81 mg by mouth daily.    [provider]  busPIRone (BUSPAR) 30 MG tablet TAKE 2/3 TABLET(20MG ) TWICE A DAY 06/10/17   [provider]  Cholecalciferol (D3-1000) 1000 units capsule Take 1,000 Units by mouth daily.    [provider]  Cyanocobalamin (VITAMIN B-12) 1000 MCG SUBL Place 1 tablet (1,000 mcg total) under the tongue daily. 05/05/16   Plotnikov, [name], MD  cyclobenzaprine (FLEXERIL) 10 MG tablet Take 10 mg by mouth 2 (two) times daily.    [provider]  D-Mannose 500 MG CAPS Take 1 capsule by mouth daily.    [provider]  esomeprazole (NEXIUM) 40 MG capsule Take 1 capsule (40 mg total) by mouth 2 (two) times daily before a meal. 10/28/14   Niel Hummer, NP  estradiol (ESTRACE) 1 MG tablet Take 1 tablet (1 mg total) by mouth daily. 10/28/14   Niel Hummer, NP  FERREX 150 150 MG capsule TAKE 1 CAPSULE (150 MG TOTAL) BY MOUTH 2 (TWO) TIMES DAILY. 07/28/17   Plotnikov, [name], MD  FLUoxetine (PROZAC) 40 MG capsule Take 1 capsule (40 mg total) by mouth every morning. 07/01/16   Plotnikov, [name], MD  levothyroxine (SYNTHROID, LEVOTHROID) 100 MCG tablet Take 1 tablet (100 mcg total) by mouth daily before breakfast. 05/13/17   Plotnikov, [name], MD  mirtazapine (REMERON) 30 MG tablet Take 1 tablet (30 mg total) by mouth at bedtime. Patient needs office visit before refills will be given 03/04/17   Plotnikov, [name], MD  Multiple Vitamins-Minerals (MULTIVITAMIN WITH MINERALS) tablet Take 1 tablet by mouth daily. 10/28/14   Niel Hummer, NP  nebivolol (BYSTOLIC) 5 MG tablet Take 1 tablet (5 mg total) by mouth daily. 08/23/17   Richardo Priest, MD  ondansetron (ZOFRAN-ODT) 4 MG disintegrating tablet Take 4 mg by mouth every 8 (eight) hours as needed for nausea or vomiting.  07/14/16   [provider]  progesterone (PROMETRIUM) 100 MG capsule Take 100 mg by mouth at bedtime.    [provider]  SUMAtriptan (IMITREX) 100 MG tablet Take 1 tablet (100 mg total) by mouth every 2 (two) hours  as needed for migraine. May repeat in 2 hours if headache persists or recurs. 12/15/16   Plotnikov, [name], MD  SUMAtriptan (IMITREX) 6 MG/0.5ML SOLN injection Inject 6 mg into the skin every 2 (two) hours as needed for migraine or headache. Reported on 08/11/2015    [provider]  telmisartan (MICARDIS) 20 MG tablet Take 1 tablet (20 mg total) by mouth daily. 08/23/17   Richardo Priest, MD  traMADol (ULTRAM) 50 MG tablet Take 50 mg by mouth every 6 (six) hours as needed (for pain).     [provider]  triamterene-hydrochlorothiazide (MAXZIDE-25) 37.5-25 MG tablet Take 1 tablet by mouth daily. 07/13/17 07/13/18  Plotnikov, [name], MD    Family History Family History  Problem Relation Age of Onset  . Coronary artery disease Father   . Heart disease Father   . Hypertension Father   . Irritable bowel syndrome Father   . Arthritis Mother   . Hypertension Mother   . Migraines Mother   . Heart disease Mother   . Diabetes Maternal Grandmother   . Irritable bowel syndrome Sister   . Irritable bowel syndrome Brother   . Colon cancer Neg Hx   . Esophageal cancer Neg Hx   . Rectal cancer Neg Hx   . Stomach cancer Neg Hx     Social History Social History   Tobacco Use  . Smoking status: Never Smoker  . Smokeless tobacco: Never Used  Substance Use Topics  . Alcohol use: Yes    Alcohol/week: 1.2 - 1.8 oz    Types: 2 - 3 Standard drinks or equivalent per week    Comment: rarely  . Drug use: No      Allergies   Buprenorphine hcl; Morphine and related; Other; Codeine; Dust mite extract; and Molds & smuts   Review of Systems Review of Systems  All other systems reviewed and are negative.    Physical Exam Updated Vital Signs BP 110/64 (BP Location: Left Arm)   Pulse (!) 59   Temp 97.7 F (36.5 C) (Oral)   Resp 16   SpO2 99%   Physical Exam  Constitutional: She is oriented to person, place, and time. She appears well-developed and well-nourished. No distress.  HENT:  Head: Normocephalic and atraumatic.  Mouth/Throat: Oropharynx is clear and moist.  Eyes: Pupils are equal, round, and reactive to light. Conjunctivae and EOM are normal.  Neck: Normal range of motion. Neck supple.  Cardiovascular: Normal rate, regular rhythm and intact distal pulses.  No murmur heard. Pulmonary/Chest: Effort normal and breath sounds normal. No respiratory distress. She has no wheezes. She has no rales.  Abdominal: Soft. She exhibits no distension. There is tenderness. There is no rebound and no guarding.  Minimal epigastric discomfort  Musculoskeletal: Normal range of motion. She exhibits no edema or tenderness.       Right lower leg: She exhibits no edema.       Left lower leg: She exhibits no edema.  Neurological: She is alert and oriented to person, place, and time.  Skin: Skin is warm and dry. No rash noted. No erythema.  Psychiatric: She has a normal mood and affect. Her behavior is normal.  Nursing note and vitals reviewed.    ED Treatments / Results  Labs (all labs ordered are listed, but only abnormal results are displayed) Labs Reviewed  BASIC METABOLIC PANEL - Abnormal; Notable for the following components:      Result Value   Sodium 134 (*)  Creatinine, Ser 1.22 (*)    GFR calc non Af Amer 45 (*)    GFR calc Af Amer 52 (*)    All other components within normal limits  URINALYSIS, ROUTINE W REFLEX MICROSCOPIC - Abnormal; Notable for the following components:    Color, Urine STRAW (*)    Hgb urine dipstick SMALL (*)    Bacteria, UA MANY (*)    Squamous Epithelial / LPF 0-5 (*)    All other components within normal limits  URINE CULTURE  CBC  LIPASE, BLOOD  I-STAT TROPONIN, ED    EKG EKG Interpretation  Date/Time:  Thursday August 25 2017 14:20:45 EDT Ventricular Rate:  68 PR Interval:  176 QRS Duration: 82 QT Interval:  398 QTC Calculation: 423 R Axis:   71 Text Interpretation:  Normal sinus rhythm with sinus arrhythmia Normal ECG No significant change since last tracing Confirmed by Blanchie Dessert ([id number]) on 08/25/2017 6:45:37 PM   Radiology Dg Chest 2 View  Result Date: 08/25/2017 CLINICAL DATA:  Chest pain. EXAM: CHEST - 2 VIEW COMPARISON:  10/15/2016 FINDINGS: The heart size and mediastinal contours are within normal limits. Both lungs are clear. Spondylosis noted within the thoracic spine. IMPRESSION: No active cardiopulmonary disease. Electronically Signed   By: Kerby Moors M.D.   On: 08/25/2017 15:18    Procedures Procedures (including critical care time)  Medications Ordered in ED Medications  ondansetron (ZOFRAN-ODT) disintegrating tablet 4 mg (4 mg Oral Given 08/25/17 1426)     Initial Impression / Assessment and Plan / ED Course  I have reviewed the triage vital signs and the nursing notes.  Pertinent labs & imaging results that were available during my care of the patient were reviewed by me and considered in my medical decision making (see chart for details).     67 year old female presenting today with multiple symptoms including exertional dyspnea, 10 pound weight gain and now chest pain today with exertion.  Patient is currently pain-free.  Patient has recently undergone stress testing which she could not complete due to hypertension.  The portion of the test she did complete was negative for ischemia.  Patient recently discontinued her verapamil due to distal edema and was started on my Cardizem by systolic  on Tuesday.  Since starting this medication she feels her symptoms have worsened.  She states it was not helping her blood pressure and when she got up and moved around she would still have pressures 160/100.  Patient states with lying down and resting her symptoms completely resolve.  They are all exertional in nature.  All this started approximately 2 months ago and she is never had these issues before.  She denies any cough, congestion or vomiting.  Low suspicion for a respiratory infection.  Patient's chest x-ray is within normal limits.  She does not appear acutely fluid overloaded at this time.  She has been eating a low-salt diet and does not use alcohol or tobacco.  Patient's initial EKG is within normal limits, troponin, CBC, BMP without acute findings.  Will add BNP and LFTs.  Will discuss with cardiology.  She was seen in the office if patient's symptoms continued they discussed doing a cardiac CT. Patient does have history of chronic UTIs however on urine today there is no sign of infection and her urine was cultured.  Pt admitted by cardiology Final Clinical Impressions(s) / ED Diagnoses   Final diagnoses:  Atypical chest pain  Essential hypertension, benign    ED Discharge Orders  None       Blanchie Dessert, MD 08/26/17 1652

## 2017-08-25 NOTE — H&P (Addendum)
Cardiology Admission History and Physical:   Patient ID: [name]; [id number]; 1951/02/04   Admission date: 08/25/2017  Primary Care Provider: Cassandria Anger, MD Primary Cardiologist: Dr. Shirlee More  Chief Complaint: Exertional dyspnea, fatigue, headaches  Patient Profile:   [name] is a 67 y.o. female with a history of hypothyroidism, fibromyalgia, hyperlipidemia, hypertension, and previous uterine leiomyosarcoma now presenting with 20-month history of progressive dyspnea on exertion associated with elevations in blood pressure, fatigue, and headaches.  History of Present Illness:   [name] states that over the last 2 months she has been experiencing dyspnea on exertion and fatigue, also headaches with activity and a flushed sensation in her face.  She started checking her blood pressure with these episodes and found it to be significantly elevated according numbers of "180/110" at times.  She was treated initially with verapamil by her PCP, was ultimately seen by Dr. Bettina Gavia for cardiology evaluation, and just recently underwent an exercise echocardiogram on April 8.  This study was negative for ischemia, hypertensive response noted.  There were no arrhythmias noted.  Subsequently, medical therapy was changed from verapamil to a combination of Bystolic and Micardis.  She states that on these medications within the last 2 days she has felt significantly worse, has difficulty even walking across the floor in her house without being short of breath and fatigued.  She became further anxious about the symptoms and was encouraged to come to the ER for assessment this evening.  She is here with her elderly parents.  She states that she is a personal friend of the [place] and has kept them abreast of her situation by texting.  She also states that she has had a weight gain of about 20 pounds in the last year.  She has some feeling of abdominal bloating but  reports that she has been following her diet carefully including low sodium intake.  She is mainly concerned about this weight gain with previous history of uterine leiomyosarcoma.   Past Medical History:  Diagnosis Date  . Abdominal pain, generalized 03/26/2014  . Acute upper respiratory infection 05/05/2016   12/17  . Adaptive colitis 12/05/2013  . Adult hypothyroidism 12/05/2013   On Levothroid  . Allergic rhinitis, cause unspecified   . Anemia 05/31/2017   Iron def  . Anxiety   . Asthma    chronic cough  . Bipolar I disorder, most recent episode (or current) depressed, severe, without mention of psychotic behavior   . Blood transfusion without reported diagnosis   . 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 Cephalalgia 12/05/2013  . 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 Chest pain, atypical 07/13/2017   2/19 CT abd/chest - ok in June 2018  . Chronic depression 10/13/2016  . Cold intolerance 03/26/2014  . Concussion w/o coma 09/18/2014  . Cough variant asthma  vs UACS/ irritable larynx  03/26/2014   FENO 08/11/2016  =   17 p no rx    . Deep phlebothrombosis, postpartum 02/14/2008   Overview:  Overview:  Annotation: affected mostly left leg. Qualifier: Diagnosis of  By: Linda Hedges MD, Heinz Knuckles   . Deep phlebothrombosis, postpartum, unspecified as to episode of care(671.40)   . Depression   . Feeling bilious 12/05/2013  . Female genuine stress incontinence 04/12/2012  . Fibromyalgia   . Gastritis   . GERD 03/04/2007   Protonix   . GERD (gastroesophageal reflux disease)   . HTN (hypertension) 05/31/2017   1/19 Verapamil  bid 2/19 added Maxzide  . Hyperlipidemia   . IBS (irritable bowel syndrome)   . 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 Irritable bowel syndrome with diarrhea 09/12/2014   Dr Olevia Perches  . IRRITABLE BOWEL SYNDROME, HX OF 03/04/2007   Qualifier: Diagnosis of  By: Quentin Cornwall CMA, Janett Billow    . Laryngopharyngeal reflux (LPR) 06/15/2016  . Leg pain, anterior, left 12/15/2016    8/18 2/19 L  . Leiomyosarcoma ([place]) 09/10/2013   Vaginal wall - rare tumor 12/12 Dr Matthew Saras S/p chemo  . 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 Lung mass 09/10/2013  . Lung nodule, solitary 09/10/2013  . Malaise and fatigue 04/16/2015  . Menopause 12/05/2013  . Migraine headache   . Mild cognitive disorder 12/05/2013  . MUSCLE PAIN 07/03/2008   Qualifier: Diagnosis of  By: Jenny Reichmann MD, Hunt Oris   . Other and unspecified hyperlipidemia   . Parathyroid adenoma 2014  . Paresthesia 12/15/2016   8/18 L lat foot - ?peroneal nerve damage  . Positive skin test 03/26/2014  . Post concussion syndrome 10/28/2014  . Severe episode of recurrent major depressive disorder, without psychotic features ([place]) 12/18/2014   On Fluoxetine   . Status post chemotherapy 2014  . Syncope 10/13/2016  . Unspecified hypothyroidism 04/25/2013  . Vitamin D deficiency 04/25/2013    Past Surgical History:  Procedure Laterality Date  . BLADDER SURGERY  06/2016   [facility], piece of mesh with necrotic tissue removed  . CESAREAN SECTION  1986  . CHOLECYSTECTOMY N/A 05/03/2013   Procedure: LAPAROSCOPIC CHOLECYSTECTOMY WITH INTRAOPERATIVE CHOLANGIOGRAM;  Surgeon: Odis Hollingshead, MD;  Location: [facility];  Service: General;  Laterality: N/A;  . COLONOSCOPY    . laprotomy  04/2012   for cancer  . NECK SURGERY  2006   C spine fusion  . PUBOVAGINAL SLING    . VAGINAL HYSTERECTOMY       Medications Prior to Admission: Prior to Admission medications   Medication Sig Start Date End Date Taking? Authorizing Provider  ALPRAZolam Duanne Moron) 0.5 MG tablet Take 1 tablet (0.5 mg total) by mouth 2 (two) times daily as needed for anxiety. Patient taking differently: Take 0.5 mg by mouth 3 (three) times daily as needed for anxiety.  08/11/16   Plotnikov, [name], MD  aspirin EC 81 MG tablet Take 81 mg by mouth daily.    [provider]  busPIRone (BUSPAR) 30 MG tablet TAKE 2/3 TABLET(20MG ) TWICE A DAY 06/10/17    [provider]  Cholecalciferol (D3-1000) 1000 units capsule Take 1,000 Units by mouth daily.    [provider]  Cyanocobalamin (VITAMIN B-12) 1000 MCG SUBL Place 1 tablet (1,000 mcg total) under the tongue daily. 05/05/16   Plotnikov, [name], MD  cyclobenzaprine (FLEXERIL) 10 MG tablet Take 10 mg by mouth 2 (two) times daily.    [provider]  D-Mannose 500 MG CAPS Take 1 capsule by mouth daily.    [provider]  esomeprazole (NEXIUM) 40 MG capsule Take 1 capsule (40 mg total) by mouth 2 (two) times daily before a meal. 10/28/14   Niel Hummer, NP  estradiol (ESTRACE) 1 MG tablet Take 1 tablet (1 mg total) by mouth daily. 10/28/14   Niel Hummer, NP  FERREX 150 150 MG capsule TAKE 1 CAPSULE (150 MG TOTAL) BY MOUTH 2 (TWO) TIMES DAILY. 07/28/17   Plotnikov, [name], MD  FLUoxetine (PROZAC) 40 MG capsule Take 1 capsule (40 mg total)  by mouth every morning. 07/01/16   Plotnikov, [name], MD  levothyroxine (SYNTHROID, LEVOTHROID) 100 MCG tablet Take 1 tablet (100 mcg total) by mouth daily before breakfast. 05/13/17   Plotnikov, [name], MD  mirtazapine (REMERON) 30 MG tablet Take 1 tablet (30 mg total) by mouth at bedtime. Patient needs office visit before refills will be given 03/04/17   Plotnikov, [name], MD  Multiple Vitamins-Minerals (MULTIVITAMIN WITH MINERALS) tablet Take 1 tablet by mouth daily. 10/28/14   Niel Hummer, NP  nebivolol (BYSTOLIC) 5 MG tablet Take 1 tablet (5 mg total) by mouth daily. 08/23/17   Richardo Priest, MD  ondansetron (ZOFRAN-ODT) 4 MG disintegrating tablet Take 4 mg by mouth every 8 (eight) hours as needed for nausea or vomiting.  07/14/16   [provider]  progesterone (PROMETRIUM) 100 MG capsule Take 100 mg by mouth at bedtime.    [provider]  SUMAtriptan (IMITREX) 100 MG tablet Take 1 tablet (100 mg total) by mouth every 2 (two) hours as needed for migraine. May repeat in 2 hours if headache  persists or recurs. 12/15/16   Plotnikov, [name], MD  SUMAtriptan (IMITREX) 6 MG/0.5ML SOLN injection Inject 6 mg into the skin every 2 (two) hours as needed for migraine or headache. Reported on 08/11/2015    [provider]  telmisartan (MICARDIS) 20 MG tablet Take 1 tablet (20 mg total) by mouth daily. 08/23/17   Richardo Priest, MD  traMADol (ULTRAM) 50 MG tablet Take 50 mg by mouth every 6 (six) hours as needed (for pain).     [provider]  triamterene-hydrochlorothiazide (MAXZIDE-25) 37.5-25 MG tablet Take 1 tablet by mouth daily. 07/13/17 07/13/18  Plotnikov, [name], MD     Allergies:    Allergies  Allergen Reactions  . Buprenorphine Hcl Other (See Comments)    Severe headache, confusion  . Morphine And Related Other (See Comments)    Severe headache Headache  . Other     Trees & Grass  . Codeine Other (See Comments)    Disoriented  . Dust Mite Extract Cough  . Molds & Smuts Cough    Social History:   Social History   Socioeconomic History  . Marital status: Divorced    Spouse name: Not on file  . Number of children: 2  . Years of education: 44  . Highest education level: Not on file  Occupational History  . 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 Financial resource strain: Not on file  . Food insecurity:    Worry: Not on file    Inability: Not on file  . Transportation needs:    Medical: Not on file    Non-medical: Not on file  Tobacco Use  . Smoking status: Never Smoker  . Smokeless tobacco: Never Used  Substance and Sexual Activity  . Alcohol use: Yes    Alcohol/week: 1.2 - 1.8 oz    Types: 2 - 3 Standard drinks or equivalent per week    Comment: rarely  . Drug use: No  . Sexual activity: Not Currently    Partners: Male  Lifestyle  . Physical activity:    Days per week: Not on file    Minutes per session: Not on file  . Stress: Not on file  Relationships  . Social connections:    Talks on  phone: Not on file    Gets together: Not on file    Attends religious service:  Not on file    Active member of club or organization: Not on file    Attends meetings of clubs or organizations: Not on file    Relationship status: Not on file  . Intimate partner violence:    Fear of current or ex partner: Not on file    Emotionally abused: Not on file    Physically abused: Not on file    Forced sexual activity: Not on file  Other Topics Concern  . Not on file  Social History Narrative   Married 4 years- divorced; married 10 years- divorced. Serially monogamous relationships. Remarried March '11. 2 sons- '82, '87.    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 (June "12).    Lives in her own home.  Currently going through a divorce.    Family History:  The patient's family history includes Arthritis in her mother; Coronary artery disease in her father; Diabetes in her maternal grandmother; Heart disease in her father and mother; Hypertension in her father and mother; Irritable bowel syndrome in her brother, father, and sister; Migraines in her mother. There is no history of Colon cancer, Esophageal cancer, Rectal cancer, or Stomach cancer.    ROS:  Please see the history of present illness.  No fevers or chills.  No orthopnea or PND.  Intermittent ankle edema particularly when she was on verapamil.  All other ROS reviewed and negative.     Physical Exam/Data:   Vitals:   08/25/17 1422 08/25/17 1516 08/25/17 1655  BP: 134/86 127/76 110/64  Pulse: 62 (!) 57 (!) 59  Resp: (!) 22 14 16   Temp: 97.7 F (36.5 C) 98.3 F (36.8 C) 97.7 F (36.5 C)  TempSrc: Oral Oral Oral  SpO2: 99% 99% 99%   Gen: Patient is in no acute distress. HEENT: Conjunctiva and lids normal, oropharynx clear. Neck: Supple, no elevated JVP or carotid bruits, no thyromegaly. Lungs: Clear to auscultation, nonlabored breathing at rest. Cardiac:  Regular rate and rhythm, no S3 or significant systolic murmur, no pericardial rub. Abdomen: Soft, nontender, bowel sounds present. Extremities: Trace ankle edema, distal pulses 2+. Skin: Warm and dry. Musculoskeletal: No kyphosis. Neuropsychiatric: Alert and oriented x3, affect grossly appropriate.   EKG:  I personally reviewed the tracing from 08/25/2017 which shows sinus arrhythmia.  Relevant CV Studies:  Exercise echocardiogram 08/22/2017: Study Conclusions  - Stress: There was a normal resting blood pressure with a   hypertensive response to stress. - Stress ECG conclusions: There were no stress arrhythmias or   conduction abnormalities. The stress ECG was normal. - Staged echo: Normal echo stress  Impressions:  - Normal study after maximal exercise.  Laboratory Data:  Chemistry Recent Labs  Lab 08/25/17 1429  NA 134*  K 4.3  CL 101  CO2 22  GLUCOSE 75  BUN 17  CREATININE 1.22*  CALCIUM 9.9  GFRNONAA 45*  GFRAA 52*  ANIONGAP 11    No results for input(s): PROT, ALBUMIN, AST, ALT, ALKPHOS, BILITOT in the last 168 hours. Hematology Recent Labs  Lab 08/25/17 1429  WBC 7.9  RBC 4.14  HGB 12.4  HCT 38.4  MCV 92.8  MCH 30.0  MCHC 32.3  RDW 14.3  PLT 342   Cardiac EnzymesNo results for input(s): TROPONINI in the last 168 hours.  Recent Labs  Lab 08/25/17 1502  TROPIPOC 0.00    BNPNo results for input(s): BNP, PROBNP in the last 168 hours.  DDimer No results for input(s): DDIMER in the last 168 hours.  Radiology/Studies:  Dg Chest 2 View  Result Date: 08/25/2017 CLINICAL DATA:  Chest pain. EXAM: CHEST - 2 VIEW COMPARISON:  10/15/2016 FINDINGS: The heart size and mediastinal contours are within normal limits. Both lungs are clear. Spondylosis noted within the thoracic spine. IMPRESSION: No active cardiopulmonary disease. Electronically Signed   By: Kerby Moors M.D.   On: 08/25/2017 15:18    Assessment and Plan:   1.  Two month history of  increasing dyspnea on exertion and fatigue, also reported exertional headaches and facial flushing.  Otherwise no palpitations or spontaneous symptoms at rest. She has had elevations in blood pressures with exertion, treated with verapamil initially and then switched to a combination of Bystolic and Micardis just recently.  She had a negative exercise echocardiogram on April 8 per Dr. Bettina Gavia and states that she has felt consistently worse over the last few days.  Also mentions generalized weight gain over the last year, otherwise watching her diet including low salt intake.  She is still very concerned about an underlying cardiac cause for her symptoms and "wants to get to the bottom of this now."  2.  Presumed essential hypertension.  If invasive cardiac workup is reassuring, she will likely need further medication adjustments.  It may be that she was having some leg swelling while on verapamil due to calcium channel blocker effect, myocarditis is a reasonable choice and for now we will reduce her Bystolic dose to see if she tolerates it better.  Further workup for other causes such as pheochromocytoma or carcinoid syndrome (hypertension, facial flushing, intermittent abdominal pain) could be considered, but remain quite unlikely.  3.  History of uterine leiomyosarcoma.  She does report weight gain and some sense of abdominal bloating.  She had a negative CT of the abdomen in June 2018 in terms of recurrence or metastatic sarcoma.  I discussed symptoms with the patient this evening and reviewed her chart including Dr. Joya Gaskins recent note from April 9.  He had mentioned the possibility of considering a cardiac CTA if symptoms continued by her follow-up visit in 4 weeks.  She reports escalating symptoms even within the last few days and remains extremely concerned about her heart and the possibility of unstable angina with atypical features. In terms of offering the most reassurance and definitive assessment,  I did talk with her about a left and right heart catheterization instead of another noninvasive cardiac evaluation.  She indicated that she was already very much leaning towards that test after speaking with friends and family members and is in agreement to proceed after reviewing the risks and benefits.  This will be scheduled for tomorrow.  In the short-term we will also check a BNP and d-dimer level.  Reduce Bystolic dose and continue with Micardis.  Further plans to follow.  Of note, she does mention that she would like to follow-up in one of our [facility] based offices going forward.  Signed, Rozann Lesches, MD  08/25/2017 8:00 PM

## 2017-08-26 ENCOUNTER — Ambulatory Visit (HOSPITAL_COMMUNITY)
Admission: EM | Disposition: A | Discharge status: Home / Self Care | Payer: Self-pay | Source: Home / Self Care | Attending: Emergency Medicine

## 2017-08-26 ENCOUNTER — Encounter (HOSPITAL_COMMUNITY)
Admission: EM | Disposition: A | Discharge status: Home / Self Care | Payer: Self-pay | Source: Home / Self Care | Attending: Emergency Medicine

## 2017-08-26 DIAGNOSIS — R69 Illness, unspecified: Secondary | ICD-10-CM | POA: Diagnosis not present

## 2017-08-26 DIAGNOSIS — E782 Mixed hyperlipidemia: Secondary | ICD-10-CM

## 2017-08-26 DIAGNOSIS — N183 Chronic kidney disease, stage 3 (moderate): Secondary | ICD-10-CM | POA: Diagnosis not present

## 2017-08-26 DIAGNOSIS — R0609 Other forms of dyspnea: Secondary | ICD-10-CM | POA: Diagnosis not present

## 2017-08-26 DIAGNOSIS — M797 Fibromyalgia: Secondary | ICD-10-CM | POA: Diagnosis not present

## 2017-08-26 DIAGNOSIS — I129 Hypertensive chronic kidney disease with stage 1 through stage 4 chronic kidney disease, or unspecified chronic kidney disease: Secondary | ICD-10-CM | POA: Diagnosis not present

## 2017-08-26 DIAGNOSIS — N179 Acute kidney failure, unspecified: Secondary | ICD-10-CM | POA: Diagnosis not present

## 2017-08-26 DIAGNOSIS — E039 Hypothyroidism, unspecified: Secondary | ICD-10-CM | POA: Diagnosis not present

## 2017-08-26 DIAGNOSIS — J45909 Unspecified asthma, uncomplicated: Secondary | ICD-10-CM | POA: Diagnosis not present

## 2017-08-26 DIAGNOSIS — R0789 Other chest pain: Secondary | ICD-10-CM | POA: Diagnosis not present

## 2017-08-26 HISTORY — PX: RIGHT/LEFT HEART CATH AND CORONARY ANGIOGRAPHY: CATH118266

## 2017-08-26 LAB — PROTIME-INR
INR: 0.98
Prothrombin Time: 12.9 seconds (ref 11.4–15.2)

## 2017-08-26 LAB — POCT I-STAT 3, ART BLOOD GAS (G3+)
Acid-base deficit: 1 mmol/L (ref 0.0–2.0)
Bicarbonate: 23.9 mmol/L (ref 20.0–28.0)
O2 Saturation: 94 %
TCO2: 25 mmol/L (ref 22–32)
pCO2 arterial: 40.6 mmHg (ref 32.0–48.0)
pH, Arterial: 7.377 (ref 7.350–7.450)
pO2, Arterial: 71 mmHg — ABNORMAL LOW (ref 83.0–108.0)

## 2017-08-26 LAB — POCT I-STAT 3, VENOUS BLOOD GAS (G3P V)
Acid-base deficit: 1 mmol/L (ref 0.0–2.0)
Bicarbonate: 25.1 mmol/L (ref 20.0–28.0)
O2 Saturation: 61 %
TCO2: 26 mmol/L (ref 22–32)
pCO2, Ven: 44.6 mmHg (ref 44.0–60.0)
pH, Ven: 7.358 (ref 7.250–7.430)
pO2, Ven: 33 mmHg (ref 32.0–45.0)

## 2017-08-26 LAB — LIPID PANEL
Cholesterol: 226 mg/dL — ABNORMAL HIGH (ref 0–200)
HDL: 37 mg/dL — ABNORMAL LOW (ref >40)
LDL Cholesterol: UNDETERMINED mg/dL (ref 0–99)
Total CHOL/HDL Ratio: 6.1 RATIO
Triglycerides: 513 mg/dL — ABNORMAL HIGH (ref <150)
VLDL: UNDETERMINED mg/dL (ref 0–40)

## 2017-08-26 LAB — BASIC METABOLIC PANEL
Anion gap: 9 (ref 5–15)
BUN: 17 mg/dL (ref 6–20)
CO2: 23 mmol/L (ref 22–32)
Calcium: 8.9 mg/dL (ref 8.9–10.3)
Chloride: 106 mmol/L (ref 101–111)
Creatinine, Ser: 1.26 mg/dL — ABNORMAL HIGH (ref 0.44–1.00)
GFR calc Af Amer: 50 mL/min — ABNORMAL LOW (ref >60)
GFR calc non Af Amer: 43 mL/min — ABNORMAL LOW (ref >60)
Glucose, Bld: 87 mg/dL (ref 65–99)
Potassium: 4.6 mmol/L (ref 3.5–5.1)
Sodium: 138 mmol/L (ref 135–145)

## 2017-08-26 LAB — TROPONIN I
Troponin I: <0.03 ng/mL (ref <0.03)
Troponin I: <0.03 ng/mL (ref <0.03)

## 2017-08-26 SURGERY — RIGHT/LEFT HEART CATH AND CORONARY ANGIOGRAPHY
Anesthesia: LOCAL

## 2017-08-26 MED ORDER — HEPARIN SODIUM (PORCINE) 1000 UNIT/ML IJ SOLN
INTRAMUSCULAR | Status: AC
  Filled 2017-08-26: qty 1

## 2017-08-26 MED ORDER — VERAPAMIL HCL 2.5 MG/ML IV SOLN
INTRAVENOUS | Status: DC | PRN
  Administered 2017-08-26: 10 mL via INTRA_ARTERIAL

## 2017-08-26 MED ORDER — FENTANYL CITRATE (PF) 100 MCG/2ML IJ SOLN
INTRAMUSCULAR | Status: AC
  Filled 2017-08-26: qty 2

## 2017-08-26 MED ORDER — IOPAMIDOL (ISOVUE-370) INJECTION 76%
INTRAVENOUS | Status: DC | PRN
  Administered 2017-08-26: 30 mL via INTRA_ARTERIAL

## 2017-08-26 MED ORDER — VERAPAMIL HCL 2.5 MG/ML IV SOLN
INTRAVENOUS | Status: AC
  Filled 2017-08-26: qty 2

## 2017-08-26 MED ORDER — MIDAZOLAM HCL 2 MG/2ML IJ SOLN
INTRAMUSCULAR | Status: AC
  Filled 2017-08-26: qty 2

## 2017-08-26 MED ORDER — IPRATROPIUM-ALBUTEROL 0.5-2.5 (3) MG/3ML IN SOLN: 3.0000 mL | Freq: Four times a day (QID) | RESPIRATORY_TRACT | Status: DC

## 2017-08-26 MED ORDER — NEBIVOLOL HCL 2.5 MG PO TABS: 2.5000 mg | ORAL_TABLET | Freq: Every day | ORAL | 6 refills | Status: DC

## 2017-08-26 MED ORDER — IPRATROPIUM-ALBUTEROL 0.5-2.5 (3) MG/3ML IN SOLN: 3.0000 mL | Freq: Four times a day (QID) | RESPIRATORY_TRACT | Status: DC | PRN

## 2017-08-26 MED ORDER — SODIUM CHLORIDE 0.9 % IV SOLN
INTRAVENOUS | Status: AC
  Administered 2017-08-26: 12:00:00 via INTRAVENOUS

## 2017-08-26 MED ORDER — HEPARIN (PORCINE) IN NACL 2-0.9 UNIT/ML-% IJ SOLN
INTRAMUSCULAR | Status: DC | PRN
  Administered 2017-08-26 (×2): 500 mL

## 2017-08-26 MED ORDER — LIDOCAINE HCL (PF) 1 % IJ SOLN
INTRAMUSCULAR | Status: AC
  Filled 2017-08-26: qty 30

## 2017-08-26 MED ORDER — LIDOCAINE HCL (PF) 1 % IJ SOLN
INTRAMUSCULAR | Status: DC | PRN
  Administered 2017-08-26: 2 mL via INTRADERMAL

## 2017-08-26 MED ORDER — SODIUM CHLORIDE 0.9 % IV SOLN: 250.0000 mL | INTRAVENOUS | Status: DC | PRN

## 2017-08-26 MED ORDER — IOPAMIDOL (ISOVUE-370) INJECTION 76%
INTRAVENOUS | Status: AC
  Filled 2017-08-26: qty 100

## 2017-08-26 MED ORDER — HEPARIN (PORCINE) IN NACL 2-0.9 UNIT/ML-% IJ SOLN
INTRAMUSCULAR | Status: AC
  Filled 2017-08-26: qty 1000

## 2017-08-26 MED ORDER — MIDAZOLAM HCL 2 MG/2ML IJ SOLN
INTRAMUSCULAR | Status: DC | PRN
  Administered 2017-08-26 (×2): 1 mg via INTRAVENOUS

## 2017-08-26 MED ORDER — FENTANYL CITRATE (PF) 100 MCG/2ML IJ SOLN
INTRAMUSCULAR | Status: DC | PRN
  Administered 2017-08-26 (×2): 25 ug via INTRAVENOUS

## 2017-08-26 MED ORDER — SODIUM CHLORIDE 0.9% FLUSH: 3.0000 mL | INTRAVENOUS | Status: DC | PRN

## 2017-08-26 MED ORDER — HEPARIN SODIUM (PORCINE) 1000 UNIT/ML IJ SOLN
INTRAMUSCULAR | Status: DC | PRN
  Administered 2017-08-26: 3500 [IU] via INTRAVENOUS

## 2017-08-26 MED ORDER — IPRATROPIUM-ALBUTEROL 0.5-2.5 (3) MG/3ML IN SOLN
RESPIRATORY_TRACT | Status: AC
  Filled 2017-08-26: qty 3

## 2017-08-26 MED ORDER — SODIUM CHLORIDE 0.9% FLUSH
3.0000 mL | Freq: Two times a day (BID) | INTRAVENOUS | Status: DC
  Administered 2017-08-26: 3 mL via INTRAVENOUS

## 2017-08-26 SURGICAL SUPPLY — 18 items
BAND CMPR LRG ZPHR (HEMOSTASIS) ×1
BAND ZEPHYR COMPRESS 30 LONG (HEMOSTASIS) ×1 IMPLANT
CATH BALLN WEDGE 5F 110CM (CATHETERS) ×1 IMPLANT
CATH IMPULSE 5F ANG/FL3.5 (CATHETERS) ×1 IMPLANT
COVER PRB 48X5XTLSCP FOLD TPE (BAG) IMPLANT
COVER PROBE 5X48 (BAG) ×2
GUIDEWIRE .025 260CM (WIRE) ×1 IMPLANT
GUIDEWIRE INQWIRE 1.5J.035X260 (WIRE) IMPLANT
INQWIRE 1.5J .035X260CM (WIRE) ×2
KIT HEART LEFT (KITS) ×2 IMPLANT
NDL PERC 21GX4CM (NEEDLE) IMPLANT
NEEDLE PERC 21GX4CM (NEEDLE) ×2 IMPLANT
PACK CARDIAC CATHETERIZATION (CUSTOM PROCEDURE TRAY) ×2 IMPLANT
SHEATH RAIN 4/5FR (SHEATH) ×2 IMPLANT
SHEATH RAIN RADIAL 21G 6FR (SHEATH) ×2 IMPLANT
TRANSDUCER W/STOPCOCK (MISCELLANEOUS) ×2 IMPLANT
TUBING CIL FLEX 10 FLL-RA (TUBING) ×2 IMPLANT
WIRE HI TORQ VERSACORE-J 145CM (WIRE) ×1 IMPLANT

## 2017-08-26 NOTE — Progress Notes (Signed)
Progress Note  Patient Name: [name] Date of Encounter: 08/26/2017  Primary Cardiologist: No primary care provider on file.   Subjective   Awaiting heart catheterization, no shortness of breath, no bleeding  Inpatient Medications    Scheduled Meds: . aspirin EC  81 mg Oral Daily  . busPIRone  15 mg Oral BID  . cholecalciferol  1,000 Units Oral Daily  . cyclobenzaprine  10 mg Oral BID  . estradiol  1 mg Oral Daily  . FLUoxetine  40 mg Oral q morning - 10a  . heparin  5,000 Units Subcutaneous Q8H  . irbesartan  75 mg Oral Daily  . iron polysaccharides  150 mg Oral BID  . levothyroxine  100 mcg Oral QAC breakfast  . mirtazapine  30 mg Oral QHS  . multivitamin with minerals  1 tablet Oral Daily  . nebivolol  2.5 mg Oral Daily  . pantoprazole  40 mg Oral Daily  . progesterone  100 mg Oral QHS  . sodium chloride flush  3 mL Intravenous Q12H  . triamterene-hydrochlorothiazide  1 tablet Oral Daily  . vitamin B-12  1,000 mcg Oral Daily   Continuous Infusions: . sodium chloride    . sodium chloride 1 mL/kg/hr (08/25/17 2330)   PRN Meds: sodium chloride, acetaminophen, ALPRAZolam, ipratropium-albuterol, nitroGLYCERIN, ondansetron (ZOFRAN) IV, ondansetron, sodium chloride flush, SUMAtriptan, SUMAtriptan, traMADol   Vital Signs    Vitals:   08/26/17 0014 08/26/17 0353 08/26/17 0702 08/26/17 0745  BP: (!) 111/49 126/69  (!) 121/55  Pulse: (!) 57 61  (!) 58  Resp: 16 16  17   Temp: 97.9 F (36.6 C) 98 F (36.7 C)  97.9 F (36.6 C)  TempSrc: Oral Oral  Oral  SpO2: 97% 97%  98%  Weight:   151 lb 6.4 oz (68.7 kg)   Height:        Intake/Output Summary (Last 24 hours) at 08/26/2017 0819 Last data filed at 08/26/2017 0710 Gross per 24 hour  Intake 690.45 ml  Output 900 ml  Net -209.55 ml   Filed Weights   08/25/17 2125 08/26/17 0702  Weight: 152 lb 9.6 oz (69.2 kg) 151 lb 6.4 oz (68.7 kg)    Telemetry    No adverse arrhythmias- Personally  Reviewed  ECG    No ischemic changes- Personally Reviewed  Physical Exam   GEN: No acute distress.   Neck: No JVD Cardiac: RRR, no murmurs, rubs, or gallops.  Respiratory: Clear to auscultation bilaterally. GI: Soft, nontender, non-distended  MS: No edema; No deformity. Neuro:  Nonfocal  Psych: Normal affect   Labs    Chemistry Recent Labs  Lab 08/25/17 1429 08/25/17 1857 08/25/17 2031  NA 134*  --   --   K 4.3  --   --   CL 101  --   --   CO2 22  --   --   GLUCOSE 75  --   --   BUN 17  --   --   CREATININE 1.22*  --  1.22*  CALCIUM 9.9  --   --   PROT  --  6.6  --   ALBUMIN  --  3.6  --   AST  --  25  --   ALT  --  18  --   ALKPHOS  --  96  --   BILITOT  --  0.5  --   GFRNONAA 45*  --  45*  GFRAA 52*  --  52*  ANIONGAP 11  --   --      Hematology Recent Labs  Lab 08/25/17 1429 08/25/17 2031  WBC 7.9 8.2  RBC 4.14 4.33  HGB 12.4 13.0  HCT 38.4 39.9  MCV 92.8 92.1  MCH 30.0 30.0  MCHC 32.3 32.6  RDW 14.3 14.1  PLT 342 322    Cardiac Enzymes Recent Labs  Lab 08/25/17 2031 08/26/17 0220  TROPONINI <0.03 <0.03    Recent Labs  Lab 08/25/17 1502  TROPIPOC 0.00     BNP Recent Labs  Lab 08/25/17 1857  BNP 53.8     DDimer  Recent Labs  Lab 08/25/17 2031  DDIMER 0.39     Radiology    Dg Chest 2 View  Result Date: 08/25/2017 CLINICAL DATA:  Chest pain. EXAM: CHEST - 2 VIEW COMPARISON:  10/15/2016 FINDINGS: The heart size and mediastinal contours are within normal limits. Both lungs are clear. Spondylosis noted within the thoracic spine. IMPRESSION: No active cardiopulmonary disease. Electronically Signed   By: Kerby Moors M.D.   On: 08/25/2017 15:18    Cardiac Studies   - Left ventricle: The cavity size was normal. Wall thickness was   normal. Systolic function was normal. The estimated ejection   fraction was in the range of 60% to 65%. Wall motion was normal;   there were no regional wall motion abnormalities. Left    ventricular diastolic function parameters were normal. - Aortic valve: There was no stenosis. - Mitral valve: There was trivial regurgitation. - Right ventricle: The cavity size was normal. Systolic function   was normal. - Tricuspid valve: Peak RV-RA gradient (S): 24 mm Hg. - Pulmonary arteries: PA peak pressure: 27 mm Hg (S). - Inferior vena cava: The vessel was normal in size. The   respirophasic diameter changes were in the normal range (= 50%),   consistent with normal central venous pressure.  Impressions:  - Normal study.  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  Assessment & Plan    Dyspnea on exertion/fatigue -Awaiting cardiac catheterization.  Risks and benefits previously discussed including stroke heart attack death renal impairment bleeding.  Right and left heart catheterization. -Troponins are normal.  BNP is normal at 53.  Creatinine is 1.22.  Mixed hyperlipidemia -Triglycerides 513, recommend statin therapy with fish oil supplementation, possible icosapent ethyl (Vascepa).   Prior uterine leiomyosarcoma - CT of abdomen negative.   For questions or updates, please contact [facility] Please consult [email or web address] for contact info under Cardiology/STEMI.      Signed, Candee Furbish, MD  08/26/2017, 8:19 AM

## 2017-08-26 NOTE — Discharge Summary (Addendum)
Discharge Summary    Patient ID: [name],  MRN: [id number], DOB/AGE: Feb 27, 1951 67 y.o.  Admit date: 08/25/2017 Discharge date: 08/26/2017  Primary Care Provider: Cassandria Anger Primary Cardiologist: Dr. Shirlee More    Discharge Diagnoses    Active Problems:   Dyspnea on exertion   HTN   Hypothyroidism   HLD   Fibromyalgia   Previous uterine leiomyosarcoma   Headache   Fatigue   AKI   Allergies Allergies  Allergen Reactions  . Buprenorphine Hcl Other (See Comments)    Severe headache, confusion  . Morphine And Related Other (See Comments)    Severe headache  . Other Other (See Comments)    Trees & Grass = allergic symptoms  . Codeine Other (See Comments)    Disorientation  . Dust Mite Extract Cough  . Molds & Smuts Cough    Diagnostic Studies/Procedures    RIGHT/LEFT HEART CATH AND CORONARY ANGIOGRAPHY  Conclusion   1. No angiographic evidence of CAD 2. Normal right and left heart pressures.   Recommendations: No further ischemic workup.       History of Present Illness     [name] is a 67 y.o. female with a history of hypothyroidism, fibromyalgia, hyperlipidemia, hypertension, and previous uterine leiomyosarcoma now presenting with 36-month history of progressive dyspnea on exertion associated with elevations in blood pressure, fatigue, and headaches.  [name] states that over the last 2 months she has been experiencing dyspnea on exertion and fatigue, also headaches with activity and a flushed sensation in her face.  She started checking her blood pressure with these episodes and found it to be significantly elevated according numbers of "180/110" at times.  She was treated initially with verapamil by her PCP, was ultimately seen by Dr. Bettina Gavia for cardiology evaluation, and just recently underwent an exercise echocardiogram on April 8.  This study was negative for ischemia, hypertensive response noted.  There  were no arrhythmias noted.  Subsequently, medical therapy was changed from verapamil to a combination of Bystolic and Micardis.  She states that on these medications within the last 2 days she has felt significantly worse, has difficulty even walking across the floor in her house without being short of breath and fatigued.  She became further anxious about the symptoms and was encouraged to come to the ER for assessment this evening.  She is here with her elderly parents.  She states that she is a personal friend of the 's and has kept them abreast of her situation by texting.  She also states that she has had a weight gain of about 20 pounds in the last year.  She has some feeling of abdominal bloating but reports that she has been following her diet carefully including low sodium intake.  She is mainly concerned about this weight gain with previous history of uterine leiomyosarcoma.    Hospital Course     Consultants: None   The patient was admitted and reduced her dystolic to half dose to 2.5mg  Qd. Continue Micardis and Maxzide-25 home dose. BNP negative. Troponin has been remained negative. SCr minimally elevated to 1.26. Elevated triglyceride. D-dimer negative. Due to her going symptoms she underwent R & L cath which showed no angiographic evidence of CAD and normal right and left heart pressure. Her her symptoms non cardiac related. She was hydrated pre and post cath.   Advised to follow up with PCP for non cardiac evaluation of her chest  pain. She had prior elevated BP  However soft low here. Bystolic reduced to 2.5 this admission. Will continue home dose of Micardis and Maxzide-25. Unusually on 2 ARB.  Recommended to DC one of the ARB at follow up. This might be the reason of AKI. Will need outpatient BMET.   Further workup for other causes such as pheochromocytoma or carcinoid syndrome (hypertension, facial flushing, intermittent abdominal pain) could be considered, but remain quite  unlikely.  Mixed hyperlipidemia -Triglycerides 513, recommend statin therapy with fish oil supplementation, possible icosapent ethyl (Vascepa). Will defer to PCP.   The patient has been seen by Dr. Marlou Porch today and deemed ready for discharge home. All follow-up appointments have been scheduled. Discharge medications are listed below.    Discharge Vitals Blood pressure (!) 111/53, pulse 62, temperature 98 F (36.7 C), temperature source Oral, resp. rate 18, height 5\' 3"  (1.6 m), weight 151 lb 6.4 oz (68.7 kg), SpO2 96 %.  Filed Weights   08/25/17 2125 08/26/17 0702  Weight: 152 lb 9.6 oz (69.2 kg) 151 lb 6.4 oz (68.7 kg)    Labs & Radiologic Studies     CBC Recent Labs    08/25/17 1429 08/25/17 2031  WBC 7.9 8.2  HGB 12.4 13.0  HCT 38.4 39.9  MCV 92.8 92.1  PLT 342 696   Basic Metabolic Panel Recent Labs    08/25/17 1429 08/25/17 2031 08/26/17 0802  NA 134*  --  138  K 4.3  --  4.6  CL 101  --  106  CO2 22  --  23  GLUCOSE 75  --  87  BUN 17  --  17  CREATININE 1.22* 1.22* 1.26*  CALCIUM 9.9  --  8.9   Liver Function Tests Recent Labs    08/25/17 1857  AST 25  ALT 18  ALKPHOS 96  BILITOT 0.5  PROT 6.6  ALBUMIN 3.6   Recent Labs    08/25/17 1429  LIPASE 51   Cardiac Enzymes Recent Labs    08/25/17 2031 08/26/17 0220 08/26/17 0802  TROPONINI <0.03 <0.03 <0.03   BNP Invalid input(s): POCBNP D-Dimer Recent Labs    08/25/17 2031  DDIMER 0.39   Hemoglobin A1C No results for input(s): HGBA1C in the last 72 hours. Fasting Lipid Panel Recent Labs    08/26/17 0220  CHOL 226*  HDL 37*  LDLCALC UNABLE TO CALCULATE IF TRIGLYCERIDE OVER 400 mg/dL  TRIG 513*  CHOLHDL 6.1   Thyroid Function Tests No results for input(s): TSH, T4TOTAL, T3FREE, THYROIDAB in the last 72 hours.  Invalid input(s): FREET3  Dg Chest 2 View  Result Date: 08/25/2017 CLINICAL DATA:  Chest pain. EXAM: CHEST - 2 VIEW COMPARISON:  10/15/2016 FINDINGS: The heart size  and mediastinal contours are within normal limits. Both lungs are clear. Spondylosis noted within the thoracic spine. IMPRESSION: No active cardiopulmonary disease. Electronically Signed   By: Kerby Moors M.D.   On: 08/25/2017 15:18    Disposition   Pt is being discharged home today in good condition.  Follow-up Plans & Appointments    Follow-up Information    Plotnikov, [name], MD. Schedule an appointment as soon as possible for a visit in 5 day(s).   Specialty:  Internal Medicine Why:  for hospital follow up Contact information: [address] [place] [address] [phone]          Discharge Instructions    Diet - low sodium heart healthy   Complete by:  As  directed    Discharge instructions   Complete by:  As directed    No driving for 48 hours. No lifting over 5 lbs for 1 week. No sexual activity for 1 week. You may return to work after PCP visit next week. Keep procedure site clean & dry. If you notice increased pain, swelling, bleeding or pus, call/return!  You may shower, but no soaking baths/hot tubs/pools for 1 week.   Increase activity slowly   Complete by:  As directed       Discharge Medications   Allergies as of 08/26/2017      Reactions   Buprenorphine Hcl Other (See Comments)   Severe headache, confusion   Morphine And Related Other (See Comments)   Severe headache   Other Other (See Comments)   Trees & Grass = allergic symptoms   Codeine Other (See Comments)   Disorientation   Dust Mite Extract Cough   Molds & Smuts Cough      Medication List    TAKE these medications   ALPRAZolam 0.5 MG tablet Commonly known as:  XANAX Take 1 tablet (0.5 mg total) by mouth 2 (two) times daily as needed for anxiety. What changed:    when to take this  additional instructions   aspirin EC 81 MG tablet Take 81 mg by mouth daily.   Azelastine HCl 0.15 % Soln Place 1 spray into the nose daily.   busPIRone 10 MG tablet Commonly known as:   BUSPAR Take 20 mg by mouth 2 (two) times daily.   CALCIUM + D3 600-800 MG-UNIT Tabs Generic drug:  Calcium Carb-Cholecalciferol Take 1 tablet by mouth daily.   [employer] PROBIOTIC ACIDOPHILUS PO Take 1 capsule by mouth daily.   cyclobenzaprine 10 MG tablet Commonly known as:  FLEXERIL Take 10 mg by mouth 2 (two) times daily as needed for muscle spasms.   D-Mannose 500 MG Caps Take 500 mg by mouth daily. WITH CRANBERRY & DANDELION EXTRACT   D3-1000 1000 units capsule Generic drug:  Cholecalciferol Take 1,000 Units by mouth daily.   esomeprazole 20 MG capsule Commonly known as:  NEXIUM Take 40 mg by mouth daily before breakfast.   estradiol 1 MG tablet Commonly known as:  ESTRACE Take 1 tablet (1 mg total) by mouth daily.   FERREX 150 150 MG capsule Generic drug:  iron polysaccharides TAKE 1 CAPSULE (150 MG TOTAL) BY MOUTH 2 (TWO) TIMES DAILY.   FLUoxetine 40 MG capsule Commonly known as:  PROZAC Take 1 capsule (40 mg total) by mouth every morning.   levothyroxine 100 MCG tablet Commonly known as:  SYNTHROID, LEVOTHROID Take 1 tablet (100 mcg total) by mouth daily before breakfast.   mirtazapine 30 MG tablet Commonly known as:  REMERON Take 1 tablet (30 mg total) by mouth at bedtime. Patient needs office visit before refills will be given What changed:  additional instructions   multivitamin with minerals tablet Take 1 tablet by mouth daily.   nebivolol 2.5 MG tablet Commonly known as:  BYSTOLIC Take 1 tablet (2.5 mg total) by mouth daily. What changed:    medication strength  how much to take   ondansetron 4 MG disintegrating tablet Commonly known as:  ZOFRAN-ODT Take 4 mg by mouth every 8 (eight) hours as needed for nausea or vomiting.   progesterone 100 MG capsule Commonly known as:  PROMETRIUM Take 100 mg by mouth at bedtime.   SUMAtriptan 100 MG tablet Commonly known as:  IMITREX Take 1 tablet (100 mg  total) by mouth every 2 (two) hours as needed for  migraine. May repeat in 2 hours if headache persists or recurs. What changed:    how much to take  additional instructions   SUMAtriptan 6 MG/0.5ML Soln injection Commonly known as:  IMITREX Inject 6 mg into the skin every 2 (two) hours as needed for migraine or headache. Reported on 08/11/2015 What changed:  Another medication with the same name was changed. Make sure you understand how and when to take each.   SYSTANE PRESERVATIVE FREE 0.4-0.3 % Soln Generic drug:  Polyethyl Glyc-Propyl Glyc PF Apply 2 drops to eye 3 (three) times daily as needed (for irritation).   telmisartan 20 MG tablet Commonly known as:  MICARDIS Take 1 tablet (20 mg total) by mouth daily.   traMADol 50 MG tablet Commonly known as:  ULTRAM Take 50 mg by mouth every 6 (six) hours as needed (for pain).   triamterene-hydrochlorothiazide 37.5-25 MG tablet Commonly known as:  MAXZIDE-25 Take 1 tablet by mouth daily.   Vitamin B-12 1000 MCG Subl Place 1 tablet (1,000 mcg total) under the tongue daily. What changed:  how much to take          Outstanding Labs/Studies   BMET at follow up  Duration of Discharge Encounter   Greater than 30 minutes including physician time.  Signed, Crista Luria Bhagat PA-C 08/26/2017, 3:14 PM  Personally seen and examined. Agree with above.  Reassuring cath. Continue with primary prevention and workup for non cardiac reasons for chest discomfort. Increase mixed hyperlipidemia therapy.  She may follow up with PCP.   ECHO - normal EF. CT abd - negative.   Ambulating well. See above for details.   Candee Furbish, MD

## 2017-08-26 NOTE — Interval H&P Note (Signed)
History and Physical Interval Note:  08/26/2017 10:08 AM  [name]  has presented today for cardiac cath with the diagnosis of dyspnea. The various methods of treatment have been discussed with the patient and family. After consideration of risks, benefits and other options for treatment, the patient has consented to  Procedure(s): RIGHT/LEFT HEART CATH AND CORONARY ANGIOGRAPHY (N/A) as a surgical intervention .  The patient's history has been reviewed, patient examined, no change in status, stable for surgery.  I have reviewed the patient's chart and labs.  Questions were answered to the patient's satisfaction.    Cath Lab Visit (complete for each Cath Lab visit)  Clinical Evaluation Leading to the Procedure:   ACS: No.  Non-ACS:    Anginal Classification: CCS II  Anti-ischemic medical therapy: Minimal Therapy (1 class of medications)  Non-Invasive Test Results: No non-invasive testing performed  Prior CABG: No previous CABG         Lauree Chandler

## 2017-08-26 NOTE — H&P (View-Only) (Signed)
Progress Note  Patient Name: [name] Date of Encounter: 08/26/2017  Primary Cardiologist: No primary care provider on file.   Subjective   Awaiting heart catheterization, no shortness of breath, no bleeding  Inpatient Medications    Scheduled Meds: . aspirin EC  81 mg Oral Daily  . busPIRone  15 mg Oral BID  . cholecalciferol  1,000 Units Oral Daily  . cyclobenzaprine  10 mg Oral BID  . estradiol  1 mg Oral Daily  . FLUoxetine  40 mg Oral q morning - 10a  . heparin  5,000 Units Subcutaneous Q8H  . irbesartan  75 mg Oral Daily  . iron polysaccharides  150 mg Oral BID  . levothyroxine  100 mcg Oral QAC breakfast  . mirtazapine  30 mg Oral QHS  . multivitamin with minerals  1 tablet Oral Daily  . nebivolol  2.5 mg Oral Daily  . pantoprazole  40 mg Oral Daily  . progesterone  100 mg Oral QHS  . sodium chloride flush  3 mL Intravenous Q12H  . triamterene-hydrochlorothiazide  1 tablet Oral Daily  . vitamin B-12  1,000 mcg Oral Daily   Continuous Infusions: . sodium chloride    . sodium chloride 1 mL/kg/hr (08/25/17 2330)   PRN Meds: sodium chloride, acetaminophen, ALPRAZolam, ipratropium-albuterol, nitroGLYCERIN, ondansetron (ZOFRAN) IV, ondansetron, sodium chloride flush, SUMAtriptan, SUMAtriptan, traMADol   Vital Signs    Vitals:   08/26/17 0014 08/26/17 0353 08/26/17 0702 08/26/17 0745  BP: (!) 111/49 126/69  (!) 121/55  Pulse: (!) 57 61  (!) 58  Resp: 16 16  17   Temp: 97.9 F (36.6 C) 98 F (36.7 C)  97.9 F (36.6 C)  TempSrc: Oral Oral  Oral  SpO2: 97% 97%  98%  Weight:   151 lb 6.4 oz (68.7 kg)   Height:        Intake/Output Summary (Last 24 hours) at 08/26/2017 0819 Last data filed at 08/26/2017 0710 Gross per 24 hour  Intake 690.45 ml  Output 900 ml  Net -209.55 ml   Filed Weights   08/25/17 2125 08/26/17 0702  Weight: 152 lb 9.6 oz (69.2 kg) 151 lb 6.4 oz (68.7 kg)    Telemetry    No adverse arrhythmias- Personally  Reviewed  ECG    No ischemic changes- Personally Reviewed  Physical Exam   GEN: No acute distress.   Neck: No JVD Cardiac: RRR, no murmurs, rubs, or gallops.  Respiratory: Clear to auscultation bilaterally. GI: Soft, nontender, non-distended  MS: No edema; No deformity. Neuro:  Nonfocal  Psych: Normal affect   Labs    Chemistry Recent Labs  Lab 08/25/17 1429 08/25/17 1857 08/25/17 2031  NA 134*  --   --   K 4.3  --   --   CL 101  --   --   CO2 22  --   --   GLUCOSE 75  --   --   BUN 17  --   --   CREATININE 1.22*  --  1.22*  CALCIUM 9.9  --   --   PROT  --  6.6  --   ALBUMIN  --  3.6  --   AST  --  25  --   ALT  --  18  --   ALKPHOS  --  96  --   BILITOT  --  0.5  --   GFRNONAA 45*  --  45*  GFRAA 52*  --  52*  ANIONGAP 11  --   --      Hematology Recent Labs  Lab 08/25/17 1429 08/25/17 2031  WBC 7.9 8.2  RBC 4.14 4.33  HGB 12.4 13.0  HCT 38.4 39.9  MCV 92.8 92.1  MCH 30.0 30.0  MCHC 32.3 32.6  RDW 14.3 14.1  PLT 342 322    Cardiac Enzymes Recent Labs  Lab 08/25/17 2031 08/26/17 0220  TROPONINI <0.03 <0.03    Recent Labs  Lab 08/25/17 1502  TROPIPOC 0.00     BNP Recent Labs  Lab 08/25/17 1857  BNP 53.8     DDimer  Recent Labs  Lab 08/25/17 2031  DDIMER 0.39     Radiology    Dg Chest 2 View  Result Date: 08/25/2017 CLINICAL DATA:  Chest pain. EXAM: CHEST - 2 VIEW COMPARISON:  10/15/2016 FINDINGS: The heart size and mediastinal contours are within normal limits. Both lungs are clear. Spondylosis noted within the thoracic spine. IMPRESSION: No active cardiopulmonary disease. Electronically Signed   By: Kerby Moors M.D.   On: 08/25/2017 15:18    Cardiac Studies   - Left ventricle: The cavity size was normal. Wall thickness was   normal. Systolic function was normal. The estimated ejection   fraction was in the range of 60% to 65%. Wall motion was normal;   there were no regional wall motion abnormalities. Left    ventricular diastolic function parameters were normal. - Aortic valve: There was no stenosis. - Mitral valve: There was trivial regurgitation. - Right ventricle: The cavity size was normal. Systolic function   was normal. - Tricuspid valve: Peak RV-RA gradient (S): 24 mm Hg. - Pulmonary arteries: PA peak pressure: 27 mm Hg (S). - Inferior vena cava: The vessel was normal in size. The   respirophasic diameter changes were in the normal range (= 50%),   consistent with normal central venous pressure.  Impressions:  - Normal study.  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  Assessment & Plan    Dyspnea on exertion/fatigue -Awaiting cardiac catheterization.  Risks and benefits previously discussed including stroke heart attack death renal impairment bleeding.  Right and left heart catheterization. -Troponins are normal.  BNP is normal at 53.  Creatinine is 1.22.  Mixed hyperlipidemia -Triglycerides 513, recommend statin therapy with fish oil supplementation, possible icosapent ethyl (Vascepa).   Prior uterine leiomyosarcoma - CT of abdomen negative.   For questions or updates, please contact [place] Please consult [email or web address] for contact info under Cardiology/STEMI.      Signed, Candee Furbish, MD  08/26/2017, 8:19 AM

## 2017-08-26 NOTE — Progress Notes (Addendum)
Pt got discharged to home, discharge instructions provided and patient showed understanding to it, IV taken out,Telemonitor DC,pt left unit in wheelchair with all of the belongings accompanied with a family member (Son)  [place], Therapist, sports

## 2017-08-27 LAB — URINE CULTURE

## 2017-08-29 ENCOUNTER — Ambulatory Visit (INDEPENDENT_AMBULATORY_CARE_PROVIDER_SITE_OTHER): Payer: Medicare HMO | Admitting: Internal Medicine

## 2017-08-29 ENCOUNTER — Encounter (HOSPITAL_COMMUNITY): Payer: Self-pay | Admitting: Cardiovascular Disease

## 2017-08-29 VITALS — BP 128/88 | HR 84 | Temp 97.9°F | Ht 63.0 in | Wt 149.0 lb

## 2017-08-29 DIAGNOSIS — R1084 Generalized abdominal pain: Secondary | ICD-10-CM | POA: Diagnosis not present

## 2017-08-29 DIAGNOSIS — F332 Major depressive disorder, recurrent severe without psychotic features: Secondary | ICD-10-CM

## 2017-08-29 DIAGNOSIS — R69 Illness, unspecified: Secondary | ICD-10-CM | POA: Diagnosis not present

## 2017-08-29 DIAGNOSIS — E039 Hypothyroidism, unspecified: Secondary | ICD-10-CM

## 2017-08-29 DIAGNOSIS — I10 Essential (primary) hypertension: Secondary | ICD-10-CM | POA: Diagnosis not present

## 2017-08-29 DIAGNOSIS — N183 Chronic kidney disease, stage 3 unspecified: Secondary | ICD-10-CM

## 2017-08-29 MED ORDER — PANCRELIPASE (LIP-PROT-AMYL) 36000-114000 UNITS PO CPEP: 36000.0000 [IU] | ORAL_CAPSULE | Freq: Three times a day (TID) | ORAL | 11 refills | Status: DC

## 2017-08-29 NOTE — Assessment & Plan Note (Signed)
Gluten free diet did not help Trial of Creon On whole food diet

## 2017-08-29 NOTE — Assessment & Plan Note (Signed)
Labs

## 2017-08-29 NOTE — Progress Notes (Signed)
Subjective:  Patient ID: [name], female    DOB: [birth year]  Age: 67 y.o. MRN: [id number]  CC: No chief complaint on file.   HPI Floreine Kingdon presents for post-hospital f/u for CP; pt had a (-) heart catheterization. C/o diarrhea, nausea. Asking for a nutrition ref  Per hx:  "The patient was admitted and reduced her dystolic to half dose to 2.5mg  Qd. Continue Micardis and Maxzide-25 home dose. BNP negative. Troponin has been remained negative. SCr minimally elevated to 1.26. Elevated triglyceride. D-dimer negative. Due to her going symptoms she underwent R & L cath which showed no angiographic evidence of CAD and normal right and left heart pressure. Her her symptoms non cardiac related. She was hydrated pre and post cath.   Advised to follow up with PCP for non cardiac evaluation of her chest pain. She had prior elevated BP  However soft low here. Bystolic reduced to 2.5 this admission. Will continue home dose of Micardis and Maxzide-25. Unusually on 2 ARB.  Recommended to DC one of the ARB at follow up. This might be the reason of AKI. Will need outpatient BMET.   Further workup for othercauses such as pheochromocytoma or carcinoid syndrome (hypertension, facial flushing, intermittent abdominal pain)could be considered, but remain quite unlikely.  Mixed hyperlipidemia -Triglycerides 513, recommend statin therapy with fish oil supplementation, possible icosapent ethyl(Vascepa). Will defer to PCP.   The patient has been seen by Dr. Marlou Porch today and deemed ready for discharge home. All follow-up appointments have been scheduled. Discharge medications are listed below. "    Outpatient Medications Prior to Visit  Medication Sig Dispense Refill  . ALPRAZolam (XANAX) 0.5 MG tablet Take 1 tablet (0.5 mg total) by mouth 2 (two) times daily as needed for anxiety. (Patient taking differently: Take 0.5 mg by mouth See admin instructions. Take 0.5 mg by mouth up to three  times a day as needed for anxiety and 0.5 mg scheduled at bedtime) 60 tablet 0  . aspirin EC 81 MG tablet Take 81 mg by mouth daily.    . Azelastine HCl 0.15 % SOLN Place 1 spray into the nose daily.    . busPIRone (BUSPAR) 10 MG tablet Take 20 mg by mouth 2 (two) times daily.    . Calcium Carb-Cholecalciferol (CALCIUM + D3) 600-800 MG-UNIT TABS Take 1 tablet by mouth daily.    . Cholecalciferol (D3-1000) 1000 units capsule Take 1,000 Units by mouth daily.    . Cyanocobalamin (VITAMIN B-12) 1000 MCG SUBL Place 1 tablet (1,000 mcg total) under the tongue daily. (Patient taking differently: Place 2,000 mcg under the tongue daily. ) 100 tablet 3  . cyclobenzaprine (FLEXERIL) 10 MG tablet Take 10 mg by mouth 2 (two) times daily as needed for muscle spasms.     . D-Mannose 500 MG CAPS Take 500 mg by mouth daily. WITH CRANBERRY & DANDELION EXTRACT    . esomeprazole (NEXIUM) 20 MG capsule Take 40 mg by mouth daily before breakfast.     . estradiol (ESTRACE) 1 MG tablet Take 1 tablet (1 mg total) by mouth daily. 90 tablet 3  . FERREX 150 150 MG capsule TAKE 1 CAPSULE (150 MG TOTAL) BY MOUTH 2 (TWO) TIMES DAILY. 60 capsule 6  . FLUoxetine (PROZAC) 40 MG capsule Take 1 capsule (40 mg total) by mouth every morning. 90 capsule 2  . Lactobacillus (CVS PROBIOTIC ACIDOPHILUS PO) Take 1 capsule by mouth daily.    Marland Kitchen levothyroxine (SYNTHROID, LEVOTHROID) 100  MCG tablet Take 1 tablet (100 mcg total) by mouth daily before breakfast. 90 tablet 2  . mirtazapine (REMERON) 30 MG tablet Take 1 tablet (30 mg total) by mouth at bedtime. Patient needs office visit before refills will be given (Patient taking differently: Take 30 mg by mouth at bedtime. ) 90 tablet 0  . Multiple Vitamins-Minerals (MULTIVITAMIN WITH MINERALS) tablet Take 1 tablet by mouth daily.    . nebivolol (BYSTOLIC) 2.5 MG tablet Take 1 tablet (2.5 mg total) by mouth daily. 30 tablet 6  . ondansetron (ZOFRAN-ODT) 4 MG disintegrating tablet Take 4 mg by  mouth every 8 (eight) hours as needed for nausea or vomiting.   0  . Polyethyl Glyc-Propyl Glyc PF (SYSTANE PRESERVATIVE FREE) 0.4-0.3 % SOLN Apply 2 drops to eye 3 (three) times daily as needed (for irritation).    . progesterone (PROMETRIUM) 100 MG capsule Take 100 mg by mouth at bedtime.    . SUMAtriptan (IMITREX) 100 MG tablet Take 1 tablet (100 mg total) by mouth every 2 (two) hours as needed for migraine. May repeat in 2 hours if headache persists or recurs. (Patient taking differently: Take 50-100 mg by mouth every 2 (two) hours as needed for migraine. May repeat in 2 hours if headache persists or recurs.) 12 tablet 5  . SUMAtriptan (IMITREX) 6 MG/0.5ML SOLN injection Inject 6 mg into the skin every 2 (two) hours as needed for migraine or headache. Reported on 08/11/2015    . telmisartan (MICARDIS) 20 MG tablet Take 1 tablet (20 mg total) by mouth daily. 30 tablet 3  . traMADol (ULTRAM) 50 MG tablet Take 50 mg by mouth every 6 (six) hours as needed (for pain).     . triamterene-hydrochlorothiazide (MAXZIDE-25) 37.5-25 MG tablet Take 1 tablet by mouth daily. 30 tablet 11   No facility-administered medications prior to visit.     ROS Review of Systems  Constitutional: Negative for activity change, appetite change, chills, fatigue and unexpected weight change.  HENT: Negative for congestion, mouth sores and sinus pressure.   Eyes: Negative for visual disturbance.  Respiratory: Negative for cough and chest tightness.   Cardiovascular: Positive for chest pain.  Gastrointestinal: Negative for abdominal pain and nausea.  Genitourinary: Negative for difficulty urinating, frequency and vaginal pain.  Musculoskeletal: Negative for back pain and gait problem.  Skin: Negative for pallor and rash.  Neurological: Negative for dizziness, tremors, weakness, numbness and headaches.  Psychiatric/Behavioral: Positive for decreased concentration and dysphoric mood. Negative for confusion, sleep disturbance  and suicidal ideas. The patient is nervous/anxious.     Objective:  BP 128/88 (BP Location: Left Arm, Patient Position: Sitting, Cuff Size: Normal)   Pulse 84   Temp 97.9 F (36.6 C) (Oral)   Ht 5\' 3"  (1.6 m)   Wt 149 lb (67.6 kg)   SpO2 98%   BMI 26.39 kg/m   BP Readings from Last 3 Encounters:  08/29/17 128/88  08/26/17 125/69  08/23/17 132/90    Wt Readings from Last 3 Encounters:  08/29/17 149 lb (67.6 kg)  08/26/17 151 lb 6.4 oz (68.7 kg)  08/23/17 152 lb 12.8 oz (69.3 kg)    Physical Exam  Constitutional: She appears well-developed. No distress.  HENT:  Head: Normocephalic.  Right Ear: External ear normal.  Left Ear: External ear normal.  Nose: Nose normal.  Mouth/Throat: Oropharynx is clear and moist.  Eyes: Pupils are equal, round, and reactive to light. Conjunctivae are normal. Right eye exhibits no discharge. Left eye exhibits  no discharge.  Neck: Normal range of motion. Neck supple. No JVD present. No tracheal deviation present. No thyromegaly present.  Cardiovascular: Normal rate, regular rhythm and normal heart sounds.  Pulmonary/Chest: No stridor. No respiratory distress. She has no wheezes.  Abdominal: Soft. Bowel sounds are normal. She exhibits no distension and no mass. There is no tenderness. There is no rebound and no guarding.  Musculoskeletal: She exhibits no edema or tenderness.  Lymphadenopathy:    She has no cervical adenopathy.  Neurological: She displays normal reflexes. No cranial nerve deficit. She exhibits normal muscle tone. Coordination normal.  Skin: No rash noted. No erythema.  Psychiatric: Her behavior is normal. Judgment and thought content normal.  anxious  Lab Results  Component Value Date   WBC 8.2 08/25/2017   HGB 13.0 08/25/2017   HCT 39.9 08/25/2017   PLT 322 08/25/2017   GLUCOSE 87 08/26/2017   CHOL 226 (H) 08/26/2017   TRIG 513 (H) 08/26/2017   HDL 37 (L) 08/26/2017   LDLDIRECT 161.0 05/05/2016   LDLCALC UNABLE TO  CALCULATE IF TRIGLYCERIDE OVER 400 mg/dL 08/26/2017   ALT 18 08/25/2017   AST 25 08/25/2017   NA 138 08/26/2017   K 4.6 08/26/2017   CL 106 08/26/2017   CREATININE 1.26 (H) 08/26/2017   BUN 17 08/26/2017   CO2 23 08/26/2017   TSH 1.62 05/02/2017   INR 0.98 08/26/2017   HGBA1C 5.5 06/27/2017    Dg Chest 2 View  Result Date: 08/25/2017 CLINICAL DATA:  Chest pain. EXAM: CHEST - 2 VIEW COMPARISON:  10/15/2016 FINDINGS: The heart size and mediastinal contours are within normal limits. Both lungs are clear. Spondylosis noted within the thoracic spine. IMPRESSION: No active cardiopulmonary disease. Electronically Signed   By: Kerby Moors M.D.   On: 08/25/2017 15:18    Assessment & Plan:   There are no diagnoses linked to this encounter. I am having [name] Marks maintain her SUMAtriptan, estradiol, multivitamin with minerals, traMADol, Vitamin B-12, FLUoxetine, ALPRAZolam, ondansetron, progesterone, Cholecalciferol, SUMAtriptan, mirtazapine, cyclobenzaprine, D-Mannose, levothyroxine, triamterene-hydrochlorothiazide, FERREX 150, aspirin EC, telmisartan, esomeprazole, Calcium Carb-Cholecalciferol, Lactobacillus (CVS PROBIOTIC ACIDOPHILUS PO), busPIRone, Azelastine HCl, Polyethyl Glyc-Propyl Glyc PF, and nebivolol.  No orders of the defined types were placed in this encounter.    Follow-up: No follow-ups on file.  Walker Kehr, MD

## 2017-08-29 NOTE — Assessment & Plan Note (Signed)
Maxzide, Micardis, Bystolic

## 2017-08-29 NOTE — Assessment & Plan Note (Signed)
Mirtazipine Buspar

## 2017-08-30 MED FILL — Heparin Sodium (Porcine) 2 Unit/ML in Sodium Chloride 0.9%: INTRAMUSCULAR | Qty: 1000 | Status: AC

## 2017-09-01 ENCOUNTER — Telehealth: Payer: Self-pay | Admitting: Internal Medicine

## 2017-09-01 DIAGNOSIS — I10 Essential (primary) hypertension: Secondary | ICD-10-CM

## 2017-09-01 NOTE — Telephone Encounter (Signed)
Copied from [place] [id number]. Topic: Quick Communication - See Telephone Encounter >> Sep 01, 2017 12:58 PM Robina Ade, Helene Kelp D wrote: CRM for notification. See Telephone encounter for: 09/01/17. Patient would like a urinalysis from Dr. Alain Marion since her my chart suggested for her to have one done after her hospital stay. Please call patient back if she can have this done at the office.

## 2017-09-01 NOTE — Telephone Encounter (Signed)
I saw in her discharge summary about a BMET but nothing about a urinalysis, please advise.

## 2017-09-02 DIAGNOSIS — N390 Urinary tract infection, site not specified: Secondary | ICD-10-CM | POA: Diagnosis not present

## 2017-09-02 DIAGNOSIS — R319 Hematuria, unspecified: Secondary | ICD-10-CM | POA: Diagnosis not present

## 2017-09-02 DIAGNOSIS — R5383 Other fatigue: Secondary | ICD-10-CM | POA: Diagnosis not present

## 2017-09-03 NOTE — Telephone Encounter (Signed)
Ok UA and BMET Thx

## 2017-09-05 NOTE — Telephone Encounter (Signed)
LM notifying pt labs entered

## 2017-09-08 ENCOUNTER — Encounter: Payer: Self-pay | Admitting: Internal Medicine

## 2017-09-26 NOTE — Progress Notes (Signed)
Cardiology Office Note:    Date:  09/27/2017   ID:  [name], DOB 10/24/1950, MRN [id number]  PCP:  Cassandria Anger, MD  Cardiologist:  Shirlee More, MD    Referring MD: Cassandria Anger, MD    ASSESSMENT:    1. Essential hypertension   2. SOB (shortness of breath)    PLAN:    In order of problems listed above:  1. BP remains above target I have added a low-dose of the thiazide diuretic follow-up renal function BMP in 1 week 2. Improved normal coronary arteriography and left and right heart hemodynamics.  This is not anginal equivalent or heart failure.   Next appointment: 4 weeks   Medication Adjustments/Labs and Tests Ordered: Current medicines are reviewed at length with the patient today.  Concerns regarding medicines are outlined above.  No orders of the defined types were placed in this encounter.  No orders of the defined types were placed in this encounter.   Chief Complaint  Patient presents with  . Follow up on Cath  . Hypertension    History of Present Illness:    [name] is a 67 y.o. female with a hx of hypertension and exertional dyspnea with recent admission and coronary angiography for exertional SOB last seen by me 08/22/17.  Procedures   RIGHT/LEFT HEART CATH AND CORONARY ANGIOGRAPHY  Conclusion  1. No angiographic evidence of CAD 2. Normal right and left heart pressures.  Recommendations: No further ischemic workup.     Compliance with diet, lifestyle and medications: Yes  Home blood pressure in the morning runs in the range of 1 30-1 80 but during the day as high as 024 and diastolics to 97 associated with feeling badly breathless and headache. Past Medical History:  Diagnosis Date  . Abdominal pain, generalized 03/26/2014  . Acute upper respiratory infection 05/05/2016   12/17  . Adaptive colitis 12/05/2013  . Adult hypothyroidism 12/05/2013   On Levothroid  . Allergic rhinitis, cause unspecified   .  Anemia 05/31/2017   Iron def  . Anxiety   . Asthma    chronic cough  . Bipolar I disorder, most recent episode (or current) depressed, severe, without mention of psychotic behavior   . Blood transfusion without reported diagnosis   . 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 Cephalalgia 12/05/2013  . 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 Chest pain, atypical 07/13/2017   2/19 CT abd/chest - ok in June 2018  . Chronic depression 10/13/2016  . Cold intolerance 03/26/2014  . Concussion w/o coma 09/18/2014  . Cough variant asthma  vs UACS/ irritable larynx  03/26/2014   FENO 08/11/2016  =   17 p no rx    . Deep phlebothrombosis, postpartum 02/14/2008   Overview:  Overview:  Annotation: affected mostly left leg. Qualifier: Diagnosis of  By: Linda Hedges MD, Heinz Knuckles   . Deep phlebothrombosis, postpartum, unspecified as to episode of care(671.40)   . Depression   . Feeling bilious 12/05/2013  . Female genuine stress incontinence 04/12/2012  . Fibromyalgia   . Gastritis   . GERD 03/04/2007   Protonix   . GERD (gastroesophageal reflux disease)   . HTN (hypertension) 05/31/2017   1/19 Verapamil bid 2/19 added Maxzide  . Hyperlipidemia   . IBS (irritable bowel syndrome)   . 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 Irritable bowel syndrome with diarrhea 09/12/2014   Dr Olevia Perches  . IRRITABLE BOWEL SYNDROME, HX OF 03/04/2007  Qualifier: Diagnosis of  By: Quentin Cornwall CMA, Janett Billow    . Laryngopharyngeal reflux (LPR) 06/15/2016  . Leg pain, anterior, left 12/15/2016   8/18 2/19 L  . 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 Lung mass 09/10/2013  . Lung nodule, solitary 09/10/2013  . Malaise and fatigue 04/16/2015  . Menopause 12/05/2013  . Migraine headache   . Mild cognitive disorder 12/05/2013  . MUSCLE PAIN 07/03/2008   Qualifier: Diagnosis of  By: Jenny Reichmann MD, Hunt Oris   . Other and unspecified hyperlipidemia    . Parathyroid adenoma 2014  . Paresthesia 12/15/2016   8/18 L lat foot - ?peroneal nerve damage  . Positive skin test 03/26/2014  . Post concussion syndrome 10/28/2014  . Severe episode of recurrent major depressive disorder, without psychotic features ([place]) 12/18/2014   On Fluoxetine   . Status post chemotherapy 2014  . Syncope 10/13/2016  . Unspecified hypothyroidism 04/25/2013  . Vitamin D deficiency 04/25/2013    Past Surgical History:  Procedure Laterality Date  . BLADDER SURGERY  06/2016   [facility], piece of mesh with necrotic tissue removed  . CESAREAN SECTION  1986  . CHOLECYSTECTOMY N/A 05/03/2013   Procedure: LAPAROSCOPIC CHOLECYSTECTOMY WITH INTRAOPERATIVE CHOLANGIOGRAM;  Surgeon: Odis Hollingshead, MD;  Location: [place];  Service: General;  Laterality: N/A;  . COLONOSCOPY    . laprotomy  04/2012   for cancer  . NECK SURGERY  2006   C spine fusion  . PUBOVAGINAL SLING    . RIGHT/LEFT HEART CATH AND CORONARY ANGIOGRAPHY N/A 08/26/2017   Procedure: RIGHT/LEFT HEART CATH AND CORONARY ANGIOGRAPHY;  Surgeon: Burnell Blanks, MD;  Location: [place] [facility];  Service: Cardiovascular;  Laterality: N/A;  . VAGINAL HYSTERECTOMY      Current Medications: Current Meds  Medication Sig  . ALPRAZolam (XANAX) 0.5 MG tablet Take 1 tablet (0.5 mg total) by mouth 2 (two) times daily as needed for anxiety. (Patient taking differently: Take 0.5 mg by mouth See admin instructions. Take 0.5 mg by mouth up to three times a day as needed for anxiety and 0.5 mg scheduled at bedtime)  . aspirin EC 81 MG tablet Take 81 mg by mouth daily.  . Azelastine HCl 0.15 % SOLN Place 1 spray into the nose daily.  . busPIRone (BUSPAR) 10 MG tablet Take 20 mg by mouth 2 (two) times daily.  . Calcium Carb-Cholecalciferol (CALCIUM + D3) 600-800 MG-UNIT TABS Take 1 tablet by mouth daily.  . Cholecalciferol (D3-1000) 1000 units capsule Take 1,000 Units by mouth daily.  . Cyanocobalamin (VITAMIN B-12)  1000 MCG SUBL Place 1 tablet (1,000 mcg total) under the tongue daily. (Patient taking differently: Place 2,000 mcg under the tongue daily. )  . cyclobenzaprine (FLEXERIL) 10 MG tablet Take 10 mg by mouth 2 (two) times daily as needed for muscle spasms.   . D-Mannose 500 MG CAPS Take 500 mg by mouth daily. WITH CRANBERRY & DANDELION EXTRACT  . esomeprazole (NEXIUM) 20 MG capsule Take 40 mg by mouth daily before breakfast.   . estradiol (ESTRACE) 1 MG tablet Take 1 tablet (1 mg total) by mouth daily.  Marland Kitchen FERREX 150 150 MG capsule TAKE 1 CAPSULE (150 MG TOTAL) BY MOUTH 2 (TWO) TIMES DAILY.  Marland Kitchen FLUoxetine (PROZAC) 40 MG capsule Take 1 capsule (40 mg total) by mouth every morning.  . Lactobacillus (CVS PROBIOTIC ACIDOPHILUS  PO) Take 1 capsule by mouth daily.  Marland Kitchen levothyroxine (SYNTHROID, LEVOTHROID) 100 MCG tablet Take 1 tablet (100 mcg total) by mouth daily before breakfast.  . mirtazapine (REMERON) 30 MG tablet Take 1 tablet (30 mg total) by mouth at bedtime. Patient needs office visit before refills will be given (Patient taking differently: Take 30 mg by mouth at bedtime. )  . Multiple Vitamins-Minerals (MULTIVITAMIN WITH MINERALS) tablet Take 1 tablet by mouth daily.  . ondansetron (ZOFRAN-ODT) 4 MG disintegrating tablet Take 4 mg by mouth every 8 (eight) hours as needed for nausea or vomiting.   Vladimir Faster Glyc-Propyl Glyc PF (SYSTANE PRESERVATIVE FREE) 0.4-0.3 % SOLN Apply 2 drops to eye 3 (three) times daily as needed (for irritation).  . progesterone (PROMETRIUM) 100 MG capsule Take 100 mg by mouth at bedtime.  . SUMAtriptan (IMITREX) 100 MG tablet Take 1 tablet (100 mg total) by mouth every 2 (two) hours as needed for migraine. May repeat in 2 hours if headache persists or recurs. (Patient taking differently: Take 50-100 mg by mouth every 2 (two) hours as needed for migraine. May repeat in 2 hours if headache persists or recurs.)  . SUMAtriptan (IMITREX) 6 MG/0.5ML SOLN injection Inject 6 mg into  the skin every 2 (two) hours as needed for migraine or headache. Reported on 08/11/2015  . telmisartan (MICARDIS) 20 MG tablet Take 1 tablet (20 mg total) by mouth daily.  . traMADol (ULTRAM) 50 MG tablet Take 50 mg by mouth every 6 (six) hours as needed (for pain).   . triamterene-hydrochlorothiazide (MAXZIDE-25) 37.5-25 MG tablet Take 1 tablet by mouth daily.     Allergies:   Buprenorphine hcl; Morphine and related; Other; Codeine; Dust mite extract; and Molds & smuts   Social History   Socioeconomic History  . Marital status: Divorced    Spouse name: Not on file  . Number of children: 2  . Years of education: 87  . Highest education level: Not on file  Occupational History  . 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 Financial resource strain: Not on file  . Food insecurity:    Worry: Not on file    Inability: Not on file  . Transportation needs:    Medical: Not on file    Non-medical: Not on file  Tobacco Use  . Smoking status: Never Smoker  . Smokeless tobacco: Never Used  Substance and Sexual Activity  . Alcohol use: Yes    Alcohol/week: 1.2 - 1.8 oz    Types: 2 - 3 Standard drinks or equivalent per week    Comment: rarely  . Drug use: No  . Sexual activity: Not Currently    Partners: Male  Lifestyle  . Physical activity:    Days per week: Not on file    Minutes per session: Not on file  . Stress: Not on file  Relationships  . Social connections:    Talks on phone: Not on file    Gets together: Not on file    Attends religious service: Not on file    Active member of club or organization: Not on file    Attends meetings of clubs or organizations: Not on file    Relationship status: Not on file  Other Topics Concern  . Not on file  Social History Narrative   Married 4 years- divorced; married 10 years- divorced. Serially monogamous relationships. Remarried March '11. 2 sons- '82, '87.    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    Currently does private duty as [place] at [employer]. (June "12).    Lives in her own home.  Currently going through a divorce.     Family History: The patient's family history includes Arthritis in her mother; Coronary artery disease in her father; Diabetes in her maternal grandmother; Heart disease in her father and mother; Hypertension in her father and mother; Irritable bowel syndrome in her brother, father, and sister; Migraines in her mother. There is no history of Colon cancer, Esophageal cancer, Rectal cancer, or Stomach cancer. ROS:   Please see the history of present illness.    All other systems reviewed and are negative.  EKGs/Labs/Other Studies Reviewed:    The following studies were reviewed today:    Recent Labs: 10/13/2016: Magnesium 1.7 05/02/2017: TSH 1.62 08/25/2017: ALT 18; B Natriuretic Peptide 53.8; Hemoglobin 13.0; Platelets 322 08/26/2017: BUN 17; Creatinine, Ser 1.26; Potassium 4.6; Sodium 138  Recent Lipid Panel    Component Value Date/Time   CHOL 226 (H) 08/26/2017 0220   TRIG 513 (H) 08/26/2017 0220   HDL 37 (L) 08/26/2017 0220   CHOLHDL 6.1 08/26/2017 0220   VLDL UNABLE TO CALCULATE IF TRIGLYCERIDE OVER 400 mg/dL 08/26/2017 0220   LDLCALC UNABLE TO CALCULATE IF TRIGLYCERIDE OVER 400 mg/dL 08/26/2017 0220   LDLDIRECT 161.0 05/05/2016 1056    Physical Exam:    VS:  BP 138/64   Pulse 73   Ht 5\' 3"  (1.6 m)   Wt 153 lb 1.9 oz (69.5 kg)   SpO2 98%   BMI 27.12 kg/m     Wt Readings from Last 3 Encounters:  09/27/17 153 lb 1.9 oz (69.5 kg)  08/29/17 149 lb (67.6 kg)  08/26/17 151 lb 6.4 oz (68.7 kg)     GEN:  Well nourished, well developed in no acute distress HEENT: Normal NECK: No JVD; No carotid bruits LYMPHATICS: No lymphadenopathy CARDIAC: RRR, no murmurs, rubs, gallops RESPIRATORY:  Clear to auscultation without rales, wheezing or rhonchi  ABDOMEN: Soft, non-tender, non-distended MUSCULOSKELETAL:  No edema; No  deformity  SKIN: Warm and dry NEUROLOGIC:  Alert and oriented x 3 PSYCHIATRIC:  Normal affect    Signed, Shirlee More, MD  09/27/2017 8:30 AM    [place]

## 2017-09-27 ENCOUNTER — Encounter: Payer: Self-pay | Admitting: Cardiology

## 2017-09-27 ENCOUNTER — Ambulatory Visit (INDEPENDENT_AMBULATORY_CARE_PROVIDER_SITE_OTHER): Payer: Medicare HMO | Admitting: Cardiology

## 2017-09-27 VITALS — BP 138/64 | HR 73 | Ht 63.0 in | Wt 153.1 lb

## 2017-09-27 DIAGNOSIS — R0602 Shortness of breath: Secondary | ICD-10-CM | POA: Diagnosis not present

## 2017-09-27 DIAGNOSIS — I10 Essential (primary) hypertension: Secondary | ICD-10-CM

## 2017-09-27 MED ORDER — HYDROCHLOROTHIAZIDE 12.5 MG PO CAPS: 12.5000 mg | ORAL_CAPSULE | Freq: Every day | ORAL | 11 refills | Status: DC

## 2017-09-27 NOTE — Patient Instructions (Signed)
Medication Instructions:  Your physician has recommended you make the following change in your medication:  START hydrochlorothiazide 12.5 mg daily  Labwork: Your physician recommends that you return for lab work in: 1 week. BMP  Testing/Procedures: None  Follow-Up: Your physician recommends that you schedule a follow-up appointment in: 4 weeks.  Any Other Special Instructions Will Be Listed Below (If Applicable).     If you need a refill on your cardiac medications before your next appointment, please call your pharmacy.

## 2017-09-28 ENCOUNTER — Telehealth: Payer: Self-pay

## 2017-09-28 NOTE — Telephone Encounter (Signed)
Patient called stating that after taking one dose of hydrochlorothiazide 12.5 mg her blood pressure is 160/108. Advised patient that it will take some time for the hydrochlorothiazide to begin working. Advised patient would discuss elevated blood pressure with Dr. Bettina Gavia.  Reviewed elevated blood pressure with Dr. Bettina Gavia who advised for the patient to take telmisartan 20 mg twice daily until patient returns for follow-up in one month.   Left message to return call to give Dr. Joya Gaskins advice on telmisartan.

## 2017-10-05 ENCOUNTER — Telehealth: Payer: Self-pay

## 2017-10-05 NOTE — Telephone Encounter (Signed)
Left message to remind patient to come to the office for repeat lab work. Advised patient to return call with any further questions or concerns.

## 2017-10-10 ENCOUNTER — Other Ambulatory Visit: Payer: Self-pay | Admitting: Internal Medicine

## 2017-10-11 ENCOUNTER — Emergency Department (HOSPITAL_COMMUNITY): Payer: Medicare HMO

## 2017-10-11 ENCOUNTER — Encounter (HOSPITAL_COMMUNITY): Payer: Self-pay | Admitting: Emergency Medicine

## 2017-10-11 ENCOUNTER — Emergency Department (HOSPITAL_COMMUNITY)
Admission: EM | Admit: 2017-10-11 | Discharge: 2017-10-11 | Disposition: A | Discharge status: Home / Self Care | Payer: Medicare HMO | Attending: Emergency Medicine | Admitting: Emergency Medicine

## 2017-10-11 ENCOUNTER — Other Ambulatory Visit: Payer: Self-pay

## 2017-10-11 DIAGNOSIS — Z79899 Other long term (current) drug therapy: Secondary | ICD-10-CM | POA: Diagnosis not present

## 2017-10-11 DIAGNOSIS — R350 Frequency of micturition: Secondary | ICD-10-CM | POA: Insufficient documentation

## 2017-10-11 DIAGNOSIS — E039 Hypothyroidism, unspecified: Secondary | ICD-10-CM | POA: Insufficient documentation

## 2017-10-11 DIAGNOSIS — J019 Acute sinusitis, unspecified: Secondary | ICD-10-CM | POA: Insufficient documentation

## 2017-10-11 DIAGNOSIS — R404 Transient alteration of awareness: Secondary | ICD-10-CM | POA: Diagnosis not present

## 2017-10-11 DIAGNOSIS — J45909 Unspecified asthma, uncomplicated: Secondary | ICD-10-CM | POA: Diagnosis not present

## 2017-10-11 DIAGNOSIS — R42 Dizziness and giddiness: Secondary | ICD-10-CM | POA: Diagnosis not present

## 2017-10-11 DIAGNOSIS — Z7982 Long term (current) use of aspirin: Secondary | ICD-10-CM | POA: Insufficient documentation

## 2017-10-11 DIAGNOSIS — I129 Hypertensive chronic kidney disease with stage 1 through stage 4 chronic kidney disease, or unspecified chronic kidney disease: Secondary | ICD-10-CM | POA: Diagnosis not present

## 2017-10-11 DIAGNOSIS — N183 Chronic kidney disease, stage 3 (moderate): Secondary | ICD-10-CM | POA: Insufficient documentation

## 2017-10-11 DIAGNOSIS — N390 Urinary tract infection, site not specified: Secondary | ICD-10-CM | POA: Diagnosis not present

## 2017-10-11 DIAGNOSIS — R55 Syncope and collapse: Secondary | ICD-10-CM | POA: Diagnosis not present

## 2017-10-11 LAB — CBC WITH DIFFERENTIAL/PLATELET
Basophils Absolute: 0.1 10*3/uL (ref 0.0–0.1)
Basophils Relative: 1 %
Eosinophils Absolute: 0.1 10*3/uL (ref 0.0–0.7)
Eosinophils Relative: 2 %
HCT: 37.2 % (ref 36.0–46.0)
Hemoglobin: 12.3 g/dL (ref 12.0–15.0)
Lymphocytes Relative: 31 %
Lymphs Abs: 2.1 10*3/uL (ref 0.7–4.0)
MCH: 30.1 pg (ref 26.0–34.0)
MCHC: 33.1 g/dL (ref 30.0–36.0)
MCV: 91 fL (ref 78.0–100.0)
Monocytes Absolute: 0.4 10*3/uL (ref 0.1–1.0)
Monocytes Relative: 6 %
Neutro Abs: 4.1 10*3/uL (ref 1.7–7.7)
Neutrophils Relative %: 60 %
Platelets: 340 10*3/uL (ref 150–400)
RBC: 4.09 MIL/uL (ref 3.87–5.11)
RDW: 13.9 % (ref 11.5–15.5)
WBC: 6.8 10*3/uL (ref 4.0–10.5)

## 2017-10-11 LAB — COMPREHENSIVE METABOLIC PANEL
ALT: 19 U/L (ref 14–54)
AST: 24 U/L (ref 15–41)
Albumin: 4 g/dL (ref 3.5–5.0)
Alkaline Phosphatase: 91 U/L (ref 38–126)
Anion gap: 8 (ref 5–15)
BUN: 23 mg/dL — ABNORMAL HIGH (ref 6–20)
CO2: 24 mmol/L (ref 22–32)
Calcium: 10.4 mg/dL — ABNORMAL HIGH (ref 8.9–10.3)
Chloride: 97 mmol/L — ABNORMAL LOW (ref 101–111)
Creatinine, Ser: 1.56 mg/dL — ABNORMAL HIGH (ref 0.44–1.00)
GFR calc Af Amer: 39 mL/min — ABNORMAL LOW (ref >60)
GFR calc non Af Amer: 33 mL/min — ABNORMAL LOW (ref >60)
Glucose, Bld: 93 mg/dL (ref 65–99)
Potassium: 5 mmol/L (ref 3.5–5.1)
Sodium: 129 mmol/L — ABNORMAL LOW (ref 135–145)
Total Bilirubin: 0.4 mg/dL (ref 0.3–1.2)
Total Protein: 6.9 g/dL (ref 6.5–8.1)

## 2017-10-11 LAB — BASIC METABOLIC PANEL
Anion gap: 7 (ref 5–15)
BUN: 19 mg/dL (ref 6–20)
CO2: 21 mmol/L — ABNORMAL LOW (ref 22–32)
Calcium: 8.6 mg/dL — ABNORMAL LOW (ref 8.9–10.3)
Chloride: 105 mmol/L (ref 101–111)
Creatinine, Ser: 1.34 mg/dL — ABNORMAL HIGH (ref 0.44–1.00)
GFR calc Af Amer: 46 mL/min — ABNORMAL LOW (ref >60)
GFR calc non Af Amer: 40 mL/min — ABNORMAL LOW (ref >60)
Glucose, Bld: 89 mg/dL (ref 65–99)
Potassium: 4.3 mmol/L (ref 3.5–5.1)
Sodium: 133 mmol/L — ABNORMAL LOW (ref 135–145)

## 2017-10-11 LAB — URINALYSIS, ROUTINE W REFLEX MICROSCOPIC
Bilirubin Urine: NEGATIVE
Glucose, UA: NEGATIVE mg/dL
Hgb urine dipstick: NEGATIVE
Ketones, ur: NEGATIVE mg/dL
Leukocytes, UA: NEGATIVE
Nitrite: NEGATIVE
Protein, ur: NEGATIVE mg/dL
Specific Gravity, Urine: 1.01 (ref 1.005–1.030)
pH: 7 (ref 5.0–8.0)

## 2017-10-11 LAB — I-STAT TROPONIN, ED: Troponin i, poc: 0.01 ng/mL (ref 0.00–0.08)

## 2017-10-11 MED ORDER — HEPARIN SOD (PORK) LOCK FLUSH 100 UNIT/ML IV SOLN
500.0000 [IU] | Freq: Once | INTRAVENOUS | Status: DC
  Filled 2017-10-11: qty 5

## 2017-10-11 MED ORDER — SODIUM CHLORIDE 0.9 % IV BOLUS
1000.0000 mL | Freq: Once | INTRAVENOUS | Status: AC
  Administered 2017-10-11: 1000 mL via INTRAVENOUS

## 2017-10-11 MED ORDER — MECLIZINE HCL 25 MG PO TABS: 25.0000 mg | ORAL_TABLET | Freq: Three times a day (TID) | ORAL | 0 refills | Status: DC | PRN

## 2017-10-11 MED ORDER — ACETAMINOPHEN 325 MG PO TABS
650.0000 mg | ORAL_TABLET | Freq: Once | ORAL | Status: AC
  Administered 2017-10-11: 650 mg via ORAL
  Filled 2017-10-11: qty 2

## 2017-10-11 MED ORDER — AMOXICILLIN-POT CLAVULANATE 875-125 MG PO TABS: 1.0000 | ORAL_TABLET | Freq: Two times a day (BID) | ORAL | 0 refills | Status: DC

## 2017-10-11 NOTE — ED Triage Notes (Addendum)
Patient here via EMS out running errands, near syncope. Dizziness, nausea, 4mg  zofran. CBG 96. Reports that she has been taking medication for UTI , "I don't think it is working".

## 2017-10-11 NOTE — ED Provider Notes (Signed)
[facility] DEPT Provider Note   CSN: [id number] Arrival date & time: 10/11/17  1434     History   Chief Complaint Chief Complaint  Patient presents with  . Near Syncope  . Urinary Frequency    HPI [name] Luetta Nutting is a 67 y.o. female with a past medical history of hypertension, fibromyalgia, bipolar 1, IBS, chronic urinary tract infection who presents today for evaluation of near syncope.  She reports that she was out running errands when she got very lightheaded and dizzy, she was able to lay down and reports that she fully passed out for "a couple of seconds."  She denies any trauma from this.  She denies chest pain, shortness of breath, or headache.  She reports that for the past few days she has generally not been feeling well.  She was seen by her urologist at [facility] for this and had a urine culture obtained on 5/16 which grew out E. coli sensitive to Bactrim.  She has been on Bactrim since then and reports no significant improvement.  She reports that she is still having urinary frequency, and urgency.  She reports that for the past few days she has been generally feeling weak and unwell which has been gradually worsening.    Of note on 08/26/2017 she had a left and right heart cath with patent arteries and normal LVEF.  HPI  Past Medical History:  Diagnosis Date  . Abdominal pain, generalized 03/26/2014  . Acute upper respiratory infection 05/05/2016   12/17  . Adaptive colitis 12/05/2013  . Adult hypothyroidism 12/05/2013   On Levothroid  . Allergic rhinitis, cause unspecified   . Anemia 05/31/2017   Iron def  . Anxiety   . Asthma    chronic cough  . Bipolar I disorder, most recent episode (or current) depressed, severe, without mention of psychotic behavior   . Blood transfusion without reported diagnosis   . 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 Cephalalgia 12/05/2013  . 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 Chest pain,  atypical 07/13/2017   2/19 CT abd/chest - ok in June 2018  . Chronic depression 10/13/2016  . Cold intolerance 03/26/2014  . Concussion w/o coma 09/18/2014  . Cough variant asthma  vs UACS/ irritable larynx  03/26/2014   FENO 08/11/2016  =   17 p no rx    . Deep phlebothrombosis, postpartum 02/14/2008   Overview:  Overview:  Annotation: affected mostly left leg. Qualifier: Diagnosis of  By: Linda Hedges MD, Heinz Knuckles   . Deep phlebothrombosis, postpartum, unspecified as to episode of care(671.40)   . Depression   . Feeling bilious 12/05/2013  . Female genuine stress incontinence 04/12/2012  . Fibromyalgia   . Gastritis   . GERD 03/04/2007   Protonix   . GERD (gastroesophageal reflux disease)   . HTN (hypertension) 05/31/2017   1/19 Verapamil bid 2/19 added Maxzide  . Hyperlipidemia   . IBS (irritable bowel syndrome)   . 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 Irritable bowel syndrome with diarrhea 09/12/2014   Dr Olevia Perches  . IRRITABLE BOWEL SYNDROME, HX OF 03/04/2007   Qualifier: Diagnosis of  By: Quentin Cornwall CMA, Janett Billow    . Laryngopharyngeal reflux (LPR) 06/15/2016  . Leg pain, anterior, left 12/15/2016   8/18 2/19 L  . 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 Lung mass 09/10/2013  .  Lung nodule, solitary 09/10/2013  . Malaise and fatigue 04/16/2015  . Menopause 12/05/2013  . Migraine headache   . Mild cognitive disorder 12/05/2013  . MUSCLE PAIN 07/03/2008   Qualifier: Diagnosis of  By: Jenny Reichmann MD, Hunt Oris   . Other and unspecified hyperlipidemia   . Parathyroid adenoma 2014  . Paresthesia 12/15/2016   8/18 L lat foot - ?peroneal nerve damage  . Positive skin test 03/26/2014  . Post concussion syndrome 10/28/2014  . Severe episode of recurrent major depressive disorder, without psychotic features ([place]) 12/18/2014   On Fluoxetine   . Status post chemotherapy 2014  . Syncope 10/13/2016  . Unspecified  hypothyroidism 04/25/2013  . Vitamin D deficiency 04/25/2013    Patient Active Problem List   Diagnosis Date Noted  . Dyspnea on exertion 08/25/2017  . Chest pain in adult 07/13/2017  . HTN (hypertension) 05/31/2017  . Anemia 05/31/2017  . Leg pain, anterior, left 12/15/2016  . Paresthesia 12/15/2016  . Syncope 10/13/2016  . Migraine headache 10/13/2016  . Asthma 10/13/2016  . Anxiety 10/13/2016  . Hyperlipidemia 10/13/2016  . UTI (urinary tract infection) 10/13/2016  . CKD (chronic kidney disease) stage 3, GFR 30-59 ml/min (HCC) 10/13/2016  . Chronic depression 10/13/2016  . Hypothyroidism 10/13/2016  . Laryngopharyngeal reflux (LPR) 06/15/2016  . Acute upper respiratory infection 05/05/2016  . Degenerative disc disease, cervical 11/29/2015  . IBS (irritable bowel syndrome) 11/24/2015  . Facet arthritis of cervical region 10/16/2015  . Chronic urinary tract infection 07/22/2015  . Malaise and fatigue 04/16/2015  . Severe episode of recurrent major depressive disorder, without psychotic features ([place]) 12/18/2014  . Post concussion syndrome 10/28/2014  . 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 Depression 10/25/2014  . Major depressive disorder, recurrent, severe without psychotic features ([place])   . 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 Concussion w/o coma 09/18/2014  . Irritable bowel syndrome with diarrhea 09/12/2014  . 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 Cold intolerance 03/26/2014  . Cough variant asthma  vs UACS/ irritable larynx  03/26/2014  . Abdominal pain, generalized 03/26/2014  . Positive skin test 03/26/2014  . Adjustment disorder with anxiety 02/07/2014  . Cervical pain 12/05/2013  . Fibromyalgia 12/05/2013  . Cephalalgia 12/05/2013  . Adult hypothyroidism 12/05/2013  . Adaptive colitis 12/05/2013  . Menopause 12/05/2013  . Mild cognitive disorder 12/05/2013  . Feeling bilious 12/05/2013  .  Leiomyosarcoma ([place]) 09/10/2013  . Lung mass 09/10/2013  . Lung nodule, solitary 09/10/2013  . Abnormal chest x-ray   . Unspecified hypothyroidism 04/25/2013  . Vitamin D deficiency 04/25/2013  . 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 Female genuine stress incontinence 04/12/2012  . Healthcare maintenance 10/30/2010  . MUSCLE PAIN 07/03/2008  . BIPLR I MOST RECENT EPIS DPRS SEV NO PSYCHOT [facility] 02/14/2008  . DEEP PHLEBOTHROMBOSIS PP UNSPEC AS EPIS CARE 02/14/2008  . Bipolar affective disorder, current episode depressed ([place]) 02/14/2008  . Deep phlebothrombosis, postpartum 02/14/2008  . HYPERLIPIDEMIA 03/04/2007  . ALLERGIC RHINITIS 03/04/2007  . GERD 03/04/2007  . IRRITABLE BOWEL SYNDROME, HX OF 03/04/2007    Past Surgical History:  Procedure Laterality Date  . BLADDER SURGERY  06/2016   [facility], piece of mesh with necrotic tissue removed  . CESAREAN SECTION  1986  . CHOLECYSTECTOMY N/A 05/03/2013   Procedure: LAPAROSCOPIC CHOLECYSTECTOMY WITH INTRAOPERATIVE CHOLANGIOGRAM;  Surgeon: Odis Hollingshead, MD;  Location: [facility];  Service: General;  Laterality: N/A;  . COLONOSCOPY    .  laprotomy  04/2012   for cancer  . NECK SURGERY  2006   C spine fusion  . PUBOVAGINAL SLING    . RIGHT/LEFT HEART CATH AND CORONARY ANGIOGRAPHY N/A 08/26/2017   Procedure: RIGHT/LEFT HEART CATH AND CORONARY ANGIOGRAPHY;  Surgeon: Burnell Blanks, MD;  Location: [facility];  Service: Cardiovascular;  Laterality: N/A;  . VAGINAL HYSTERECTOMY       OB History   None      Home Medications    Prior to Admission medications   Medication Sig Start Date End Date Taking? Authorizing Provider  aspirin EC 81 MG tablet Take 81 mg by mouth daily.   Yes [provider]  Azelastine HCl 0.15 % SOLN Place 1 spray into the nose daily.   Yes [provider]  busPIRone (BUSPAR) 10 MG tablet Take 20 mg by mouth 2 (two) times daily.   Yes [provider]  Calcium  Carb-Cholecalciferol (CALCIUM + D3) 600-800 MG-UNIT TABS Take 1 tablet by mouth daily.   Yes [provider]  Cholecalciferol (D3-1000) 1000 units capsule Take 1,000 Units by mouth daily.   Yes [provider]  Cyanocobalamin (VITAMIN B-12) 1000 MCG SUBL Place 1 tablet (1,000 mcg total) under the tongue daily. Patient taking differently: Place 2,000 mcg under the tongue daily.  05/05/16  Yes Plotnikov, [name], MD  D-Mannose 500 MG CAPS Take 500 mg by mouth daily. WITH CRANBERRY & DANDELION EXTRACT   Yes [provider]  esomeprazole (NEXIUM) 20 MG capsule Take 20 mg by mouth daily before breakfast.    Yes [provider]  estradiol (ESTRACE) 1 MG tablet Take 1 tablet (1 mg total) by mouth daily. 10/28/14  Yes Niel Hummer, NP  FERREX 150 150 MG capsule TAKE 1 CAPSULE (150 MG TOTAL) BY MOUTH 2 (TWO) TIMES DAILY. 07/28/17  Yes Plotnikov, [name], MD  FLUoxetine (PROZAC) 40 MG capsule Take 1 capsule (40 mg total) by mouth every morning. 07/01/16  Yes Plotnikov, [name], MD  hydrochlorothiazide (MICROZIDE) 12.5 MG capsule Take 1 capsule (12.5 mg total) by mouth daily. 09/27/17 12/26/17 Yes Richardo Priest, MD  Lactobacillus ([employer]S PROBIOTIC ACIDOPHILUS PO) Take 1 capsule by mouth daily.   Yes [provider]  levothyroxine (SYNTHROID, LEVOTHROID) 100 MCG tablet Take 1 tablet (100 mcg total) by mouth daily before breakfast. 05/13/17  Yes Plotnikov, [name], MD  mirtazapine (REMERON) 30 MG tablet Take 1 tablet (30 mg total) by mouth at bedtime. Patient needs office visit before refills will be given Patient taking differently: Take 30 mg by mouth at bedtime.  03/04/17  Yes Plotnikov, [name], MD  Multiple Vitamins-Minerals (MULTIVITAMIN WITH MINERALS) tablet Take 1 tablet by mouth daily. 10/28/14  Yes Niel Hummer, NP  ondansetron (ZOFRAN-ODT) 4 MG disintegrating tablet Take 4 mg by mouth every 8 (eight) hours as needed for nausea or vomiting.  07/14/16   Yes [provider]  Polyethyl Glyc-Propyl Glyc PF (SYSTANE PRESERVATIVE FREE) 0.4-0.3 % SOLN Apply 2 drops to eye 3 (three) times daily as needed (for irritation).   Yes [provider]  progesterone (PROMETRIUM) 100 MG capsule Take 100 mg by mouth at bedtime.   Yes [provider]  sulfamethoxazole-trimethoprim (BACTRIM DS,SEPTRA DS) 800-160 MG tablet TAKE 1 TABLET BY MOUTH TWICE A DAY FOR 7 DAYS 10/03/17  Yes [provider]  SUMAtriptan (IMITREX) 100 MG tablet Take 0.5-1 tablets (50-100 mg total) by mouth every 2 (two) hours as needed for  migraine. May repeat in 2 hours if headache persists or recurs. 10/11/17  Yes Plotnikov, [name], MD  telmisartan (MICARDIS) 20 MG tablet Take 1 tablet (20 mg total) by mouth daily. 08/23/17  Yes Richardo Priest, MD  triamterene-hydrochlorothiazide (MAXZIDE-25) 37.5-25 MG tablet Take 1 tablet by mouth daily. 07/13/17 07/13/18 Yes Plotnikov, [name], MD  ALPRAZolam Duanne Moron) 0.5 MG tablet Take 1 tablet (0.5 mg total) by mouth 2 (two) times daily as needed for anxiety. Patient taking differently: Take 0.5 mg by mouth See admin instructions. Take 0.5 mg by mouth up to three times a day as needed for anxiety and 0.5 mg scheduled at bedtime 08/11/16   Plotnikov, [name], MD  amoxicillin-clavulanate (AUGMENTIN) 875-125 MG tablet Take 1 tablet by mouth every 12 (twelve) hours. 10/11/17   Lorin Glass, PA-C  cyclobenzaprine (FLEXERIL) 10 MG tablet Take 10 mg by mouth 2 (two) times daily as needed for muscle spasms.     [provider]  meclizine (ANTIVERT) 25 MG tablet Take 1 tablet (25 mg total) by mouth 3 (three) times daily as needed for dizziness. 10/11/17   Lorin Glass, PA-C  SUMAtriptan (IMITREX) 6 MG/0.5ML SOLN injection Inject 6 mg into the skin every 2 (two) hours as needed for migraine or headache. Reported on 08/11/2015    [provider]  traMADol (ULTRAM) 50 MG tablet Take 50 mg by mouth every 6  (six) hours as needed (for pain).     [provider]    Family History Family History  Problem Relation Age of Onset  . Coronary artery disease Father   . Heart disease Father   . Hypertension Father   . Irritable bowel syndrome Father   . Arthritis Mother   . Hypertension Mother   . Migraines Mother   . Heart disease Mother   . Diabetes Maternal Grandmother   . Irritable bowel syndrome Sister   . Irritable bowel syndrome Brother   . Colon cancer Neg Hx   . Esophageal cancer Neg Hx   . Rectal cancer Neg Hx   . Stomach cancer Neg Hx     Social History Social History   Tobacco Use  . Smoking status: Never Smoker  . Smokeless tobacco: Never Used  Substance Use Topics  . Alcohol use: Yes    Alcohol/week: 1.2 - 1.8 oz    Types: 2 - 3 Standard drinks or equivalent per week    Comment: rarely  . Drug use: No     Allergies   Buprenorphine hcl; Morphine and related; Other; Codeine; Dust mite extract; and Molds & smuts   Review of Systems Review of Systems  Constitutional: Negative for chills and fever.       Generally not feeling well.   HENT: Negative for congestion, ear pain, postnasal drip and sore throat.   Eyes: Negative for pain and visual disturbance.  Respiratory: Negative for cough and shortness of breath.   Cardiovascular: Negative for chest pain and palpitations.  Gastrointestinal: Positive for nausea. Negative for abdominal pain and vomiting.  Genitourinary: Positive for dysuria, frequency and urgency. Negative for decreased urine volume, flank pain and hematuria.  Musculoskeletal: Negative for arthralgias and back pain.  Skin: Negative for color change and rash.  Neurological: Positive for syncope and weakness (Generalized. ). Negative for seizures and headaches.  Psychiatric/Behavioral: Negative for confusion.  All other systems reviewed and are negative.    Physical Exam Updated Vital Signs BP 130/76 (BP Location: Right Arm)  Pulse 67    Temp 98.6 F (37 C) (Oral)   Resp 14   Ht 5\' 3"  (1.6 m)   Wt 67.6 kg (149 lb)   SpO2 99%   BMI 26.39 kg/m   Physical Exam  Constitutional: She is oriented to person, place, and time. She appears well-developed and well-nourished. No distress.  HENT:  Head: Normocephalic and atraumatic.  Mouth/Throat: Oropharynx is clear and moist.  Eyes: Pupils are equal, round, and reactive to light. Conjunctivae and EOM are normal.  Neck: Normal range of motion. Neck supple.  Cardiovascular: Normal rate, regular rhythm, normal heart sounds and intact distal pulses.  No murmur heard. Pulmonary/Chest: Effort normal and breath sounds normal. No respiratory distress.  Abdominal: Soft. Bowel sounds are normal. She exhibits no distension.  Musculoskeletal: She exhibits no edema or deformity.  Neurological: She is alert and oriented to person, place, and time. No cranial nerve deficit. She exhibits normal muscle tone. Coordination normal.  5/5 strength in bilateral upper and lower extremities.  Skin: Skin is warm and dry. She is not diaphoretic.  Psychiatric: She has a normal mood and affect. Her behavior is normal.  Nursing note and vitals reviewed.    ED Treatments / Results  Labs (all labs ordered are listed, but only abnormal results are displayed) Labs Reviewed  COMPREHENSIVE METABOLIC PANEL - Abnormal; Notable for the following components:      Result Value   Sodium 129 (*)    Chloride 97 (*)    BUN 23 (*)    Creatinine, Ser 1.56 (*)    Calcium 10.4 (*)    GFR calc non Af Amer 33 (*)    GFR calc Af Amer 39 (*)    All other components within normal limits  URINALYSIS, ROUTINE W REFLEX MICROSCOPIC - Abnormal; Notable for the following components:   Color, Urine STRAW (*)    All other components within normal limits  BASIC METABOLIC PANEL - Abnormal; Notable for the following components:   Sodium 133 (*)    CO2 21 (*)    Creatinine, Ser 1.34 (*)    Calcium 8.6 (*)    GFR calc non Af  Amer 40 (*)    GFR calc Af Amer 46 (*)    All other components within normal limits  URINE CULTURE  CBC WITH DIFFERENTIAL/PLATELET  I-STAT TROPONIN, ED    EKG EKG Interpretation  Date/Time:  Tuesday Oct 11 2017 14:49:45 EDT Ventricular Rate:  66 PR Interval:    QRS Duration: 97 QT Interval:  425 QTC Calculation: 446 R Axis:   75 Text Interpretation:  Sinus rhythm Confirmed by Julianne Rice [phone]) on 10/11/2017 9:01:52 PM   Radiology Dg Chest 2 View  Result Date: 10/11/2017 CLINICAL DATA:  Urinary tract infection, syncope and loss of consciousness. EXAM: CHEST - 2 VIEW COMPARISON:  08/25/2017 FINDINGS: The heart size and mediastinal contours are within normal limits. There is no evidence of pulmonary edema, consolidation, pneumothorax, nodule or pleural fluid. The visualized skeletal structures are unremarkable. IMPRESSION: No active cardiopulmonary disease. Electronically Signed   By: Aletta Edouard M.D.   On: 10/11/2017 18:57    Procedures Procedures (including critical care time)  Medications Ordered in ED Medications  sodium chloride 0.9 % bolus 1,000 mL (0 mLs Intravenous Stopped 10/11/17 1750)  sodium chloride 0.9 % bolus 1,000 mL (0 mLs Intravenous Stopped 10/11/17 1945)  acetaminophen (TYLENOL) tablet 650 mg (650 mg Oral Given 10/11/17 1804)     Initial Impression / Assessment  and Plan / ED Course  I have reviewed the triage vital signs and the nursing notes.  Pertinent labs & imaging results that were available during my care of the patient were reviewed by me and considered in my medical decision making (see chart for details).  Clinical Course as of Oct 13 110  Tue Oct 11, 2017  1812 Will give two liters IVF then recheck BMP.   Creatinine(!): 1.56 [EH]    Clinical Course User Index [EH] Lorin Glass, PA-C   Patient presents today for evaluation of urinary frequency and near syncope.  She reports that she felt like she was going to pass out,  however did not fall and did not fully pass out.  She has been treated for a urinary tract infection with Bactrim recently.  Chart review shows that her urine was cultured is sensitive to Bactrim, her UA here is not consistent with infection, with no nitrite, protein, leukocytes, or blood.  Her white count is not elevated and she is not anemic.  Her labs show that her creatinine is mildly elevated to 1.56 from her average 1.3, and her GFR is decreased to the 30s from her normal high 40s to 50s.  She was treated with 2 L of IV fluids after which she reported that she felt better.  Repeat BMP after fluids showed improvement in her sodium, and her creatinine and GFR returned to normal levels.  When patient was evaluated by Dr. Lita Mains he detected nystagmus and nasal mucosal irritation with concern for sinusitis and peripheral vertigo, both of which patient did not endorse to me during initial exam.  Patient was given prescriptions for Augmentin for sinusitis and meclizine for vertigo.  She was given strict instructions to follow-up with her primary care provider and her urologist.  Urine was sent for culture prior to UA being back based on her recent history.  Troponin and EKG were both obtained without acute abnormalities present.  Patient was not orthostatic.  Patient discharged home.  Return precautions were discussed.  Final Clinical Impressions(s) / ED Diagnoses   Final diagnoses:  Near syncope  Urinary frequency  Acute non-recurrent sinusitis, unspecified location    ED Discharge Orders        Ordered    amoxicillin-clavulanate (AUGMENTIN) 875-125 MG tablet  Every 12 hours     10/11/17 2123    meclizine (ANTIVERT) 25 MG tablet  3 times daily PRN     10/11/17 2123       Lorin Glass, PA-C 10/12/17 0112    Julianne Rice, MD 10/17/17 1524

## 2017-10-11 NOTE — Discharge Instructions (Addendum)
Today your lab work was consistent with mild dehydration.  Your urinalysis did not show any infection, however it has been sent for culture.  Please make sure you are drinking extra fluids and eating well.  Please follow up with your primary care doctor regarding your near passing out.   I have given you an antibiotic for your sinus infection and meclizine for your dizziness.    You may have diarrhea from the antibiotics.  It is very important that you continue to take the antibiotics even if you get diarrhea unless a medical professional tells you that you may stop taking them.  If you stop too early the bacteria you are being treated for will become stronger and you may need different, more powerful antibiotics that have more side effects and worsening diarrhea.  Please stay well hydrated and consider probiotics as they may decrease the severity of your diarrhea.   The meclizine may make you sleepy.  You are being prescribed a medication which may make you sleepy. For 24 hours after one dose please do not drive, operate heavy machinery, care for a small child with out another adult present, or perform any activities that may cause harm to you or someone else if you were to fall asleep or be impaired.

## 2017-10-12 LAB — URINE CULTURE: Culture: <10000 — AB

## 2017-10-20 ENCOUNTER — Other Ambulatory Visit (HOSPITAL_COMMUNITY): Payer: Self-pay | Admitting: Psychiatry

## 2017-10-20 DIAGNOSIS — M545 Low back pain: Secondary | ICD-10-CM | POA: Diagnosis not present

## 2017-10-25 ENCOUNTER — Ambulatory Visit: Payer: Self-pay | Admitting: Cardiology

## 2017-10-26 ENCOUNTER — Inpatient Hospital Stay: Payer: Medicare HMO | Admitting: Internal Medicine

## 2017-10-26 ENCOUNTER — Inpatient Hospital Stay: Payer: Self-pay | Admitting: Internal Medicine

## 2017-10-26 DIAGNOSIS — Z0289 Encounter for other administrative examinations: Secondary | ICD-10-CM

## 2017-11-11 DIAGNOSIS — I10 Essential (primary) hypertension: Secondary | ICD-10-CM | POA: Diagnosis not present

## 2017-11-12 LAB — BASIC METABOLIC PANEL
BUN/Creatinine Ratio: 10 — ABNORMAL LOW (ref 12–28)
BUN: 12 mg/dL (ref 8–27)
CO2: 23 mmol/L (ref 20–29)
Calcium: 10.7 mg/dL — ABNORMAL HIGH (ref 8.7–10.3)
Chloride: 104 mmol/L (ref 96–106)
Creatinine, Ser: 1.26 mg/dL — ABNORMAL HIGH (ref 0.57–1.00)
GFR calc Af Amer: 51 mL/min/{1.73_m2} — ABNORMAL LOW (ref >59)
GFR calc non Af Amer: 44 mL/min/{1.73_m2} — ABNORMAL LOW (ref >59)
Glucose: 88 mg/dL (ref 65–99)
Potassium: 4.8 mmol/L (ref 3.5–5.2)
Sodium: 138 mmol/L (ref 134–144)

## 2017-11-13 DIAGNOSIS — R05 Cough: Secondary | ICD-10-CM | POA: Diagnosis not present

## 2017-11-15 ENCOUNTER — Ambulatory Visit: Payer: Medicare HMO | Admitting: Psychology

## 2017-11-18 ENCOUNTER — Other Ambulatory Visit: Payer: Self-pay

## 2017-11-18 ENCOUNTER — Telehealth: Payer: Self-pay | Admitting: Internal Medicine

## 2017-11-18 ENCOUNTER — Telehealth: Payer: Self-pay

## 2017-11-18 MED ORDER — TELMISARTAN 20 MG PO TABS: 20.0000 mg | ORAL_TABLET | Freq: Two times a day (BID) | ORAL | 3 refills | Status: DC

## 2017-11-18 NOTE — Telephone Encounter (Signed)
Patient contacted the office to schedule an appointment for a follow up for her medication refills. She advsed that she needs a refill on Telmisartan, HCTZ, and her Benicar. I tried to confirm with her these medications while on the phone with her and how she was taking them. She states that she is taking HCTZ 12.5 once daily, Telmisartan 20 mg once daily but was not advised on her Benicar. I reviewed her chart she is taking her HCTZ correctly, but upon further review I asked if she was still taking her triamterene-hctz, at which she could not tell me. I also seen a phone note that she was supposed to be taking her Telmisartan 20 mg twice daily, she stated she was unaware of this. I am unable to find Benicar in her chart. She advised me that her blood pressure has continued to run 160/80-90 and she was still having headaches. At this point she seems to be a poor historian with her medications and unaware of what blood pressure medications she is taking and needing filled.

## 2017-11-18 NOTE — Telephone Encounter (Signed)
Copied from [facility] [id number]. Topic: Appointment Scheduling - Scheduling Inquiry for Clinic >> Nov 18, 2017  1:11 PM [name] wrote: Reason for CRM: pt would like to switch to dr Deborra Medina

## 2017-11-18 NOTE — Telephone Encounter (Signed)
Patient is requesting to transfer her care from Dr. Alain Marion and Noralee Space to Dr. Deborra Medina and Cherylann Banas. Dr. Felicie Morn, is this okay with you? Dr. Deborra Medina, is this okay with you?

## 2017-11-20 NOTE — Telephone Encounter (Signed)
Okay with me.  Thank you °

## 2017-11-21 ENCOUNTER — Telehealth: Payer: Self-pay

## 2017-11-21 NOTE — Telephone Encounter (Signed)
Patient's pharmacy sent a fax stating that telmisartan is on manufacture back order. What can this be changed to?

## 2017-11-21 NOTE — Telephone Encounter (Signed)
What is available? candasatin 16 mg a day

## 2017-11-21 NOTE — Telephone Encounter (Signed)
New prescription for candesartan 16 mg daily successfully sent in to replace telmisartan.

## 2017-11-22 NOTE — Telephone Encounter (Signed)
Okay with me 

## 2017-11-23 NOTE — Telephone Encounter (Signed)
LMOVM for pt to RTC to schedule appt with Dr. Aron/thx dmf

## 2017-11-29 ENCOUNTER — Ambulatory Visit: Payer: Medicare HMO | Admitting: Psychology

## 2017-11-29 DIAGNOSIS — Z961 Presence of intraocular lens: Secondary | ICD-10-CM | POA: Diagnosis not present

## 2017-11-29 DIAGNOSIS — H538 Other visual disturbances: Secondary | ICD-10-CM | POA: Diagnosis not present

## 2017-11-29 DIAGNOSIS — H43811 Vitreous degeneration, right eye: Secondary | ICD-10-CM | POA: Diagnosis not present

## 2017-11-30 DIAGNOSIS — H43811 Vitreous degeneration, right eye: Secondary | ICD-10-CM | POA: Diagnosis not present

## 2017-11-30 DIAGNOSIS — Z961 Presence of intraocular lens: Secondary | ICD-10-CM | POA: Diagnosis not present

## 2017-12-13 ENCOUNTER — Ambulatory Visit: Payer: Medicare HMO | Admitting: Psychology

## 2017-12-20 ENCOUNTER — Ambulatory Visit: Payer: Medicare HMO | Admitting: Psychology

## 2017-12-22 ENCOUNTER — Ambulatory Visit (INDEPENDENT_AMBULATORY_CARE_PROVIDER_SITE_OTHER): Payer: Medicare HMO | Admitting: Psychology

## 2017-12-22 DIAGNOSIS — F4321 Adjustment disorder with depressed mood: Secondary | ICD-10-CM

## 2017-12-22 DIAGNOSIS — R69 Illness, unspecified: Secondary | ICD-10-CM | POA: Diagnosis not present

## 2018-01-03 ENCOUNTER — Ambulatory Visit: Payer: Medicare HMO | Admitting: Psychology

## 2018-01-05 ENCOUNTER — Ambulatory Visit: Payer: Medicare HMO | Admitting: Psychology

## 2018-01-29 ENCOUNTER — Other Ambulatory Visit: Payer: Self-pay | Admitting: Internal Medicine

## 2018-01-30 ENCOUNTER — Ambulatory Visit (INDEPENDENT_AMBULATORY_CARE_PROVIDER_SITE_OTHER): Payer: Medicare HMO | Admitting: Psychology

## 2018-01-30 DIAGNOSIS — R69 Illness, unspecified: Secondary | ICD-10-CM | POA: Diagnosis not present

## 2018-01-30 DIAGNOSIS — F4321 Adjustment disorder with depressed mood: Secondary | ICD-10-CM | POA: Diagnosis not present

## 2018-02-07 ENCOUNTER — Other Ambulatory Visit: Payer: Self-pay | Admitting: Internal Medicine

## 2018-02-07 ENCOUNTER — Ambulatory Visit (INDEPENDENT_AMBULATORY_CARE_PROVIDER_SITE_OTHER): Payer: Medicare HMO | Admitting: Psychology

## 2018-02-07 DIAGNOSIS — F4321 Adjustment disorder with depressed mood: Secondary | ICD-10-CM | POA: Diagnosis not present

## 2018-02-07 DIAGNOSIS — R69 Illness, unspecified: Secondary | ICD-10-CM | POA: Diagnosis not present

## 2018-02-13 ENCOUNTER — Ambulatory Visit: Payer: Medicare HMO | Admitting: Psychology

## 2018-02-13 ENCOUNTER — Telehealth: Payer: Self-pay

## 2018-02-13 NOTE — Telephone Encounter (Signed)
Left message for patient to return call regarding medication refill for Bystolic.  Need to verify dose.

## 2018-02-14 DIAGNOSIS — N390 Urinary tract infection, site not specified: Secondary | ICD-10-CM | POA: Diagnosis not present

## 2018-02-15 NOTE — Progress Notes (Signed)
Cardiology Office Note:    Date:  02/16/2018   ID:  [name], DOB [birth year], MRN [id number]  PCP:  Cassandria Anger, MD  Cardiologist:  Shirlee More, MD    Referring MD: Cassandria Anger, MD    ASSESSMENT:    1. Essential hypertension   2. CKD (chronic kidney disease) stage 3, GFR 30-59 ml/min (HCC)    PLAN:    In order of problems listed above:  1. Blood pressure appears to be at target repeat in the office 1 3286, blood pressure runs from 120-130/80-90 at rest.  She is taking a combination thiazide diuretic ARB beta-blocker will continue the same with her CKD recheck renal function today and see back in the office in 6 months.  She checks her blood pressure at times with physical activity and stress and told her we will focus on the rest measurements. 2. Recheck BMP today if worsen may need to discontinue diuretic and/or ARB   Next appointment: 6 months   Medication Adjustments/Labs and Tests Ordered: Current medicines are reviewed at length with the patient today.  Concerns regarding medicines are outlined above.  Orders Placed This Encounter  Procedures  . Basic metabolic panel   Meds ordered this encounter  Medications  . nebivolol (BYSTOLIC) 5 MG tablet    Sig: Take 1 tablet (5 mg total) by mouth daily.    Dispense:  90 tablet    Refill:  2    Chief Complaint  Patient presents with  . Hypertension    History of Present Illness:    [name] [name] is a 67 y.o. female with a hx of hypertension and exertional dyspnea   last seen 09/27/17.  She underwent left and right heart catheterization 08/26/2017 with normal coronary arteriography and hemodynamics and left ventricular function. Compliance with diet, lifestyle and medications: Yes  Recent UTI treated at a urgent care.  Compliant with medications Home blood pressure is variable but at rest runs less than 140 less than 90 typically 1 09-6 30 systolic.  No chest pain palpitation  shortness of breath. Past Medical History:  Diagnosis Date  . Abdominal pain, generalized 03/26/2014  . Acute upper respiratory infection 05/05/2016   12/17  . Adaptive colitis 12/05/2013  . Adult hypothyroidism 12/05/2013   On Levothroid  . Allergic rhinitis, cause unspecified   . Anemia 05/31/2017   Iron def  . Anxiety   . Asthma    chronic cough  . Bipolar I disorder, most recent episode (or current) depressed, severe, without mention of psychotic behavior   . Blood transfusion without reported diagnosis   . 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 Cephalalgia 12/05/2013  . 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 Chest pain, atypical 07/13/2017   2/19 CT abd/chest - ok in June 2018  . Chronic depression 10/13/2016  . Cold intolerance 03/26/2014  . Concussion w/o coma 09/18/2014  . Cough variant asthma  vs UACS/ irritable larynx  03/26/2014   FENO 08/11/2016  =   17 p no rx    . Deep phlebothrombosis, postpartum 02/14/2008   Overview:  Overview:  Annotation: affected mostly left leg. Qualifier: Diagnosis of  By: Linda Hedges MD, Heinz Knuckles   . Deep phlebothrombosis, postpartum, unspecified as to episode of care(671.40)   . Depression   . Feeling bilious 12/05/2013  . Female genuine stress incontinence 04/12/2012  . Fibromyalgia   . Gastritis   . GERD 03/04/2007   Protonix   . GERD (gastroesophageal  reflux disease)   . HTN (hypertension) 05/31/2017   1/19 Verapamil bid 2/19 added Maxzide  . Hyperlipidemia   . IBS (irritable bowel syndrome)   . 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 Irritable bowel syndrome with diarrhea 09/12/2014   Dr [name]  . IRRITABLE BOWEL SYNDROME, HX OF 03/04/2007   Qualifier: Diagnosis of  By: Quentin Cornwall CMA, Janett Billow    . Laryngopharyngeal reflux (LPR) 06/15/2016  . Leg pain, anterior, left 12/15/2016   8/18 2/19 L  . 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  Lung mass 09/10/2013  . Lung nodule, solitary 09/10/2013  . Malaise and fatigue 04/16/2015  . Menopause 12/05/2013  . Migraine headache   . Mild cognitive disorder 12/05/2013  . MUSCLE PAIN 07/03/2008   Qualifier: Diagnosis of  By: Jenny Reichmann MD, Hunt Oris   . Other and unspecified hyperlipidemia   . Parathyroid adenoma 2014  . Paresthesia 12/15/2016   8/18 L lat foot - ?peroneal nerve damage  . Positive skin test 03/26/2014  . Post concussion syndrome 10/28/2014  . Severe episode of recurrent major depressive disorder, without psychotic features ([place]) 12/18/2014   On Fluoxetine   . Status post chemotherapy 2014  . Syncope 10/13/2016  . Unspecified hypothyroidism 04/25/2013  . Vitamin D deficiency 04/25/2013    Past Surgical History:  Procedure Laterality Date  . BLADDER SURGERY  06/2016   [facility], piece of mesh with necrotic tissue removed  . CESAREAN SECTION  1986  . CHOLECYSTECTOMY N/A 05/03/2013   Procedure: LAPAROSCOPIC CHOLECYSTECTOMY WITH INTRAOPERATIVE CHOLANGIOGRAM;  Surgeon: Odis Hollingshead, MD;  Location: [place];  Service: General;  Laterality: N/A;  . COLONOSCOPY    . laprotomy  04/2012   for cancer  . NECK SURGERY  2006   C spine fusion  . PUBOVAGINAL SLING    . RIGHT/LEFT HEART CATH AND CORONARY ANGIOGRAPHY N/A 08/26/2017   Procedure: RIGHT/LEFT HEART CATH AND CORONARY ANGIOGRAPHY;  Surgeon: Burnell Blanks, MD;  Location: [place] [facility];  Service: Cardiovascular;  Laterality: N/A;  . VAGINAL HYSTERECTOMY      Current Medications: Current Meds  Medication Sig  . ALPRAZolam (XANAX) 0.5 MG tablet Take 1 tablet (0.5 mg total) by mouth 2 (two) times daily as needed for anxiety. (Patient taking differently: Take 0.5 mg by mouth See admin instructions. Take 0.5 mg by mouth up to three times a day as needed for anxiety and 0.5 mg scheduled at bedtime)  . amoxicillin-clavulanate (AUGMENTIN) 875-125 MG tablet Take 1 tablet by mouth every 12 (twelve) hours.  Marland Kitchen aspirin EC  81 MG tablet Take 81 mg by mouth daily.  . Azelastine HCl 0.15 % SOLN Place 1 spray into the nose daily.  . busPIRone (BUSPAR) 10 MG tablet Take 20 mg by mouth 2 (two) times daily.  . Calcium Carb-Cholecalciferol (CALCIUM + D3) 600-800 MG-UNIT TABS Take 1 tablet by mouth daily.  . candesartan (ATACAND) 16 MG tablet TAKE 1 TABLET EVERY DAY -THIS REPLACES TELMISARTAN-  . Cholecalciferol (D3-1000) 1000 units capsule Take 1,000 Units by mouth daily.  . Cyanocobalamin (VITAMIN B-12) 1000 MCG SUBL Place 1 tablet (1,000 mcg total) under the tongue daily. (Patient taking differently: Place 2,000 mcg under the tongue daily. )  . cyclobenzaprine (FLEXERIL) 10 MG tablet Take 10 mg by mouth 2 (two) times daily as needed for muscle spasms.   . D-Mannose 500  MG CAPS Take 500 mg by mouth daily. WITH CRANBERRY & DANDELION EXTRACT  . esomeprazole (NEXIUM) 20 MG capsule Take 20 mg by mouth daily before breakfast.   . estradiol (ESTRACE) 1 MG tablet Take 1 tablet (1 mg total) by mouth daily.  Marland Kitchen FERREX 150 150 MG capsule TAKE 1 CAPSULE BY MOUTH TWICE A DAY  . FLUoxetine (PROZAC) 40 MG capsule Take 1 capsule (40 mg total) by mouth every morning.  . Lactobacillus ([facility] PROBIOTIC ACIDOPHILUS PO) Take 1 capsule by mouth daily.  Marland Kitchen levothyroxine (SYNTHROID, LEVOTHROID) 100 MCG tablet TAKE 1 TABLET (100 MCG TOTAL) BY MOUTH DAILY BEFORE BREAKFAST.  Marland Kitchen meclizine (ANTIVERT) 25 MG tablet Take 1 tablet (25 mg total) by mouth 3 (three) times daily as needed for dizziness.  . mirtazapine (REMERON) 30 MG tablet Take 1 tablet (30 mg total) by mouth at bedtime. Patient needs office visit before refills will be given (Patient taking differently: Take 30 mg by mouth at bedtime. )  . Multiple Vitamins-Minerals (MULTIVITAMIN WITH MINERALS) tablet Take 1 tablet by mouth daily.  . nebivolol (BYSTOLIC) 5 MG tablet Take 1 tablet (5 mg total) by mouth daily.  . nitrofurantoin, macrocrystal-monohydrate, (MACROBID) 100 MG capsule Take 100 mg by  mouth 2 (two) times daily.  . ondansetron (ZOFRAN-ODT) 4 MG disintegrating tablet Take 4 mg by mouth every 8 (eight) hours as needed for nausea or vomiting.   Vladimir Faster Glyc-Propyl Glyc PF (SYSTANE PRESERVATIVE FREE) 0.4-0.3 % SOLN Apply 2 drops to eye 3 (three) times daily as needed (for irritation).  . progesterone (PROMETRIUM) 100 MG capsule Take 100 mg by mouth at bedtime.  . SUMAtriptan (IMITREX) 100 MG tablet Take 0.5-1 tablets (50-100 mg total) by mouth every 2 (two) hours as needed for migraine. May repeat in 2 hours if headache persists or recurs.  . SUMAtriptan (IMITREX) 6 MG/0.5ML SOLN injection Inject 6 mg into the skin every 2 (two) hours as needed for migraine or headache. Reported on 08/11/2015  . traMADol (ULTRAM) 50 MG tablet Take 50 mg by mouth every 6 (six) hours as needed (for pain).   . triamterene-hydrochlorothiazide (MAXZIDE-25) 37.5-25 MG tablet Take 1 tablet by mouth daily.  . [DISCONTINUED] nebivolol (BYSTOLIC) 5 MG tablet Take 5 mg by mouth daily.     Allergies:   Buprenorphine hcl; Morphine and related; Other; Codeine; Dust mite extract; and Molds & smuts   Social History   Socioeconomic History  . Marital status: Divorced    Spouse name: Not on file  . Number of children: 2  . Years of education: 62  . Highest education level: Not on file  Occupational History  . 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 Financial resource strain: Not on file  . Food insecurity:    Worry: Not on file    Inability: Not on file  . Transportation needs:    Medical: Not on file    Non-medical: Not on file  Tobacco Use  . Smoking status: Never Smoker  . Smokeless tobacco: Never Used  Substance and Sexual Activity  . Alcohol use: Yes    Alcohol/week: 2.0 - 3.0 standard drinks    Types: 2 - 3 Standard drinks or equivalent per week    Comment: rarely  . Drug use: No  . Sexual activity: Not Currently    Partners: Male  Lifestyle  .  Physical activity:    Days per week: Not on file    Minutes per session: Not  on file  . Stress: Not on file  Relationships  . Social connections:    Talks on phone: Not on file    Gets together: Not on file    Attends religious service: Not on file    Active member of club or organization: Not on file    Attends meetings of clubs or organizations: Not on file    Relationship status: Not on file  Other Topics Concern  . Not on file  Social History Narrative   Married 4 years- divorced; married 10 years- divorced. Serially monogamous relationships. Remarried March '11. 2 sons- '82, '87.    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 (June "12).    Lives in her own home.  Currently going through a divorce.     Family History: The patient's family history includes Arthritis in her mother; Coronary artery disease in her father; Diabetes in her maternal grandmother; Heart disease in her father and mother; Hypertension in her father and mother; Irritable bowel syndrome in her brother, father, and sister; Migraines in her mother. There is no history of Colon cancer, Esophageal cancer, Rectal cancer, or Stomach cancer. ROS:   Please see the history of present illness.    All other systems reviewed and are negative.  EKGs/Labs/Other Studies Reviewed:    The following studies were reviewed today:   Recent Labs: 05/02/2017: TSH 1.62 08/25/2017: B Natriuretic Peptide 53.8 10/11/2017: ALT 19; Hemoglobin 12.3; Platelets 340 11/11/2017: BUN 12; Creatinine, Ser 1.26; Potassium 4.8; Sodium 138  Recent Lipid Panel    Component Value Date/Time   CHOL 226 (H) 08/26/2017 0220   TRIG 513 (H) 08/26/2017 0220   HDL 37 (L) 08/26/2017 0220   CHOLHDL 6.1 08/26/2017 0220   VLDL UNABLE TO CALCULATE IF TRIGLYCERIDE OVER 400 mg/dL 08/26/2017 0220   LDLCALC UNABLE TO CALCULATE IF TRIGLYCERIDE OVER 400 mg/dL 08/26/2017 0220   LDLDIRECT  161.0 05/05/2016 1056    Physical Exam:    VS:  BP (!) 152/94 (BP Location: Right Arm, Patient Position: Sitting, Cuff Size: Normal)   Pulse 74   Ht 5\' 3"  (1.6 m)   Wt 157 lb 6.4 oz (71.4 kg)   SpO2 98%   BMI 27.88 kg/m     Wt Readings from Last 3 Encounters:  02/16/18 157 lb 6.4 oz (71.4 kg)  10/11/17 149 lb (67.6 kg)  09/27/17 153 lb 1.9 oz (69.5 kg)     GEN:  Well nourished, well developed in no acute distress HEENT: Normal NECK: No JVD; No carotid bruits LYMPHATICS: No lymphadenopathy CARDIAC: RRR, no murmurs, rubs, gallops RESPIRATORY:  Clear to auscultation without rales, wheezing or rhonchi  ABDOMEN: Soft, non-tender, non-distended MUSCULOSKELETAL:  No edema; No deformity  SKIN: Warm and dry NEUROLOGIC:  Alert and oriented x 3 PSYCHIATRIC:  Normal affect    Signed, Shirlee More, MD  02/16/2018 9:12 AM    [place]

## 2018-02-16 ENCOUNTER — Encounter: Payer: Self-pay | Admitting: Cardiology

## 2018-02-16 ENCOUNTER — Ambulatory Visit: Payer: Medicare HMO | Admitting: Cardiology

## 2018-02-16 VITALS — BP 152/94 | HR 74 | Ht 63.0 in | Wt 157.4 lb

## 2018-02-16 DIAGNOSIS — J301 Allergic rhinitis due to pollen: Secondary | ICD-10-CM | POA: Diagnosis not present

## 2018-02-16 DIAGNOSIS — N183 Chronic kidney disease, stage 3 unspecified: Secondary | ICD-10-CM

## 2018-02-16 DIAGNOSIS — I10 Essential (primary) hypertension: Secondary | ICD-10-CM

## 2018-02-16 DIAGNOSIS — R05 Cough: Secondary | ICD-10-CM | POA: Diagnosis not present

## 2018-02-16 DIAGNOSIS — J4530 Mild persistent asthma, uncomplicated: Secondary | ICD-10-CM | POA: Diagnosis not present

## 2018-02-16 DIAGNOSIS — Z87892 Personal history of anaphylaxis: Secondary | ICD-10-CM | POA: Diagnosis not present

## 2018-02-16 MED ORDER — NEBIVOLOL HCL 5 MG PO TABS: 5.0000 mg | ORAL_TABLET | Freq: Every day | ORAL | 2 refills | Status: DC

## 2018-02-16 NOTE — Patient Instructions (Signed)
Medication Instructions:  Your physician recommends that you continue on your current medications as directed. Please refer to the Current Medication list given to you today.  Labwork: NONE  Testing/Procedures: NONE  Follow-Up: Your physician wants you to follow-up in: 6 months.  You will receive a reminder letter in the mail two months in advance. If you don't receive a letter, please call our office to schedule the follow-up appointment.   Any Other Special Instructions Will Be Listed Below (If Applicable).     If you need a refill on your cardiac medications before your next appointment, please call your pharmacy.

## 2018-02-16 NOTE — Telephone Encounter (Signed)
Spoke with patient at office visit on 02-16-18 and she is taking Bystolic 5 mg daily and would like refills.  Refills sent to pharmacy as requested.

## 2018-02-17 LAB — BASIC METABOLIC PANEL
BUN/Creatinine Ratio: 12 (ref 12–28)
BUN: 12 mg/dL (ref 8–27)
CO2: 22 mmol/L (ref 20–29)
Calcium: 9.9 mg/dL (ref 8.7–10.3)
Chloride: 97 mmol/L (ref 96–106)
Creatinine, Ser: 1.03 mg/dL — ABNORMAL HIGH (ref 0.57–1.00)
GFR calc Af Amer: 65 mL/min/{1.73_m2} (ref >59)
GFR calc non Af Amer: 56 mL/min/{1.73_m2} — ABNORMAL LOW (ref >59)
Glucose: 102 mg/dL — ABNORMAL HIGH (ref 65–99)
Potassium: 4.2 mmol/L (ref 3.5–5.2)
Sodium: 137 mmol/L (ref 134–144)

## 2018-02-20 ENCOUNTER — Telehealth: Payer: Self-pay | Admitting: *Deleted

## 2018-02-20 NOTE — Telephone Encounter (Signed)
Pt was rx'd Bystolic by Dr. Bettina Gavia but it is $300.00 at pharmacy so wondered if there is a cheaper alternative. Please advise.

## 2018-02-21 MED ORDER — CARVEDILOL 12.5 MG PO TABS: 12.5000 mg | ORAL_TABLET | Freq: Two times a day (BID) | ORAL | 3 refills | Status: DC

## 2018-02-21 NOTE — Addendum Note (Signed)
Addended by: Jerl Santos R on: 02/21/2018 05:07 PM   Modules accepted: Orders

## 2018-02-21 NOTE — Telephone Encounter (Signed)
Re bystolic, discontinue and start carvedilol 12.5 mg bid #60 refill 3  continue to check home BP and call if consistently systolic >295 or < 621

## 2018-02-21 NOTE — Telephone Encounter (Signed)
Spoke with pt and let her know to D/C the Bystolic and to start Carvedilol 12.5 mg bid. I will phone this in to [facility] on [address].

## 2018-02-21 NOTE — Telephone Encounter (Signed)
Left message on pt phone to call us back about medication change.

## 2018-02-22 ENCOUNTER — Other Ambulatory Visit: Payer: Self-pay

## 2018-02-22 MED ORDER — CANDESARTAN CILEXETIL 16 MG PO TABS: ORAL_TABLET | ORAL | 1 refills | Status: DC

## 2018-02-27 ENCOUNTER — Ambulatory Visit: Payer: Medicare HMO | Admitting: Psychology

## 2018-02-27 DIAGNOSIS — H26493 Other secondary cataract, bilateral: Secondary | ICD-10-CM | POA: Diagnosis not present

## 2018-02-27 DIAGNOSIS — Z961 Presence of intraocular lens: Secondary | ICD-10-CM | POA: Diagnosis not present

## 2018-02-27 DIAGNOSIS — H524 Presbyopia: Secondary | ICD-10-CM | POA: Diagnosis not present

## 2018-02-27 DIAGNOSIS — H52213 Irregular astigmatism, bilateral: Secondary | ICD-10-CM | POA: Diagnosis not present

## 2018-02-27 DIAGNOSIS — H43811 Vitreous degeneration, right eye: Secondary | ICD-10-CM | POA: Diagnosis not present

## 2018-03-06 DIAGNOSIS — R69 Illness, unspecified: Secondary | ICD-10-CM | POA: Diagnosis not present

## 2018-03-13 ENCOUNTER — Ambulatory Visit (INDEPENDENT_AMBULATORY_CARE_PROVIDER_SITE_OTHER): Payer: Medicare HMO | Admitting: Psychology

## 2018-03-13 DIAGNOSIS — R69 Illness, unspecified: Secondary | ICD-10-CM | POA: Diagnosis not present

## 2018-03-13 DIAGNOSIS — F4321 Adjustment disorder with depressed mood: Secondary | ICD-10-CM

## 2018-03-16 DIAGNOSIS — R69 Illness, unspecified: Secondary | ICD-10-CM | POA: Diagnosis not present

## 2018-03-23 DIAGNOSIS — J301 Allergic rhinitis due to pollen: Secondary | ICD-10-CM | POA: Diagnosis not present

## 2018-03-23 DIAGNOSIS — J209 Acute bronchitis, unspecified: Secondary | ICD-10-CM | POA: Diagnosis not present

## 2018-03-23 DIAGNOSIS — J4530 Mild persistent asthma, uncomplicated: Secondary | ICD-10-CM | POA: Diagnosis not present

## 2018-03-23 DIAGNOSIS — Z87892 Personal history of anaphylaxis: Secondary | ICD-10-CM | POA: Diagnosis not present

## 2018-03-27 ENCOUNTER — Ambulatory Visit (INDEPENDENT_AMBULATORY_CARE_PROVIDER_SITE_OTHER): Payer: Medicare HMO | Admitting: Psychology

## 2018-03-27 DIAGNOSIS — N3020 Other chronic cystitis without hematuria: Secondary | ICD-10-CM | POA: Diagnosis not present

## 2018-03-27 DIAGNOSIS — F4321 Adjustment disorder with depressed mood: Secondary | ICD-10-CM

## 2018-03-27 DIAGNOSIS — R69 Illness, unspecified: Secondary | ICD-10-CM | POA: Diagnosis not present

## 2018-04-10 ENCOUNTER — Ambulatory Visit: Payer: Medicare HMO | Admitting: Psychology

## 2018-04-17 DIAGNOSIS — N200 Calculus of kidney: Secondary | ICD-10-CM | POA: Diagnosis not present

## 2018-04-17 DIAGNOSIS — N3020 Other chronic cystitis without hematuria: Secondary | ICD-10-CM | POA: Diagnosis not present

## 2018-04-20 DIAGNOSIS — N3020 Other chronic cystitis without hematuria: Secondary | ICD-10-CM | POA: Diagnosis not present

## 2018-04-20 DIAGNOSIS — R109 Unspecified abdominal pain: Secondary | ICD-10-CM | POA: Diagnosis not present

## 2018-04-24 ENCOUNTER — Ambulatory Visit: Payer: Medicare HMO | Admitting: Psychology

## 2018-04-27 DIAGNOSIS — H26492 Other secondary cataract, left eye: Secondary | ICD-10-CM | POA: Diagnosis not present

## 2018-04-27 DIAGNOSIS — Z961 Presence of intraocular lens: Secondary | ICD-10-CM | POA: Diagnosis not present

## 2018-04-27 DIAGNOSIS — H43811 Vitreous degeneration, right eye: Secondary | ICD-10-CM | POA: Diagnosis not present

## 2018-04-27 DIAGNOSIS — H52213 Irregular astigmatism, bilateral: Secondary | ICD-10-CM | POA: Diagnosis not present

## 2018-05-03 ENCOUNTER — Ambulatory Visit (INDEPENDENT_AMBULATORY_CARE_PROVIDER_SITE_OTHER): Payer: Medicare HMO | Admitting: Psychology

## 2018-05-03 DIAGNOSIS — F4321 Adjustment disorder with depressed mood: Secondary | ICD-10-CM

## 2018-05-03 DIAGNOSIS — R69 Illness, unspecified: Secondary | ICD-10-CM | POA: Diagnosis not present

## 2018-05-15 ENCOUNTER — Ambulatory Visit (INDEPENDENT_AMBULATORY_CARE_PROVIDER_SITE_OTHER): Payer: Medicare HMO | Admitting: Psychology

## 2018-05-15 DIAGNOSIS — R030 Elevated blood-pressure reading, without diagnosis of hypertension: Secondary | ICD-10-CM | POA: Diagnosis not present

## 2018-05-15 DIAGNOSIS — Z6827 Body mass index (BMI) 27.0-27.9, adult: Secondary | ICD-10-CM | POA: Diagnosis not present

## 2018-05-15 DIAGNOSIS — F4321 Adjustment disorder with depressed mood: Secondary | ICD-10-CM

## 2018-05-15 DIAGNOSIS — R51 Headache: Secondary | ICD-10-CM | POA: Diagnosis not present

## 2018-05-15 DIAGNOSIS — M5442 Lumbago with sciatica, left side: Secondary | ICD-10-CM | POA: Diagnosis not present

## 2018-05-15 DIAGNOSIS — R69 Illness, unspecified: Secondary | ICD-10-CM | POA: Diagnosis not present

## 2018-05-18 DIAGNOSIS — H26491 Other secondary cataract, right eye: Secondary | ICD-10-CM | POA: Diagnosis not present

## 2018-05-23 ENCOUNTER — Telehealth: Payer: Self-pay

## 2018-05-23 MED ORDER — CARVEDILOL 12.5 MG PO TABS: 12.5000 mg | ORAL_TABLET | Freq: Two times a day (BID) | ORAL | 3 refills | Status: DC

## 2018-05-23 NOTE — Telephone Encounter (Signed)
Rx for carvedilol sent to pharmacy as requested.

## 2018-05-24 ENCOUNTER — Other Ambulatory Visit: Payer: Self-pay

## 2018-05-24 MED ORDER — CARVEDILOL 12.5 MG PO TABS: 12.5000 mg | ORAL_TABLET | Freq: Two times a day (BID) | ORAL | 3 refills | Status: DC

## 2018-06-02 ENCOUNTER — Other Ambulatory Visit: Payer: Self-pay

## 2018-06-03 MED ORDER — BUSPIRONE HCL 10 MG PO TABS: 20.0000 mg | ORAL_TABLET | Freq: Two times a day (BID) | ORAL | 1 refills | Status: DC

## 2018-06-03 MED ORDER — SUMATRIPTAN SUCCINATE 100 MG PO TABS: 50.0000 mg | ORAL_TABLET | ORAL | 5 refills | Status: DC | PRN

## 2018-06-03 MED ORDER — FLUOXETINE HCL 40 MG PO CAPS: 40.0000 mg | ORAL_CAPSULE | Freq: Every morning | ORAL | 1 refills | Status: DC

## 2018-06-03 MED ORDER — MIRTAZAPINE 30 MG PO TABS: 30.0000 mg | ORAL_TABLET | Freq: Every day | ORAL | 1 refills | Status: DC

## 2018-06-05 ENCOUNTER — Ambulatory Visit: Payer: Medicare HMO | Admitting: Psychology

## 2018-06-06 ENCOUNTER — Ambulatory Visit (INDEPENDENT_AMBULATORY_CARE_PROVIDER_SITE_OTHER): Payer: Medicare Other | Admitting: *Deleted

## 2018-06-06 VITALS — BP 141/88 | HR 64 | Resp 17 | Ht 63.0 in | Wt 158.0 lb

## 2018-06-06 DIAGNOSIS — N3000 Acute cystitis without hematuria: Secondary | ICD-10-CM | POA: Diagnosis not present

## 2018-06-06 DIAGNOSIS — Z0000 Encounter for general adult medical examination without abnormal findings: Secondary | ICD-10-CM

## 2018-06-06 DIAGNOSIS — N3020 Other chronic cystitis without hematuria: Secondary | ICD-10-CM | POA: Diagnosis not present

## 2018-06-06 DIAGNOSIS — M5442 Lumbago with sciatica, left side: Secondary | ICD-10-CM | POA: Diagnosis not present

## 2018-06-06 NOTE — Patient Instructions (Addendum)
Continue doing brain stimulating activities (puzzles, reading, adult coloring books, staying active) to keep memory sharp.   Continue to eat heart healthy diet (full of fruits, vegetables, whole grains, lean protein, water--limit salt, fat, and sugar intake) and increase physical activity as tolerated.   [name] , Thank you for taking time to come for your Medicare Wellness Visit. I appreciate your ongoing commitment to your health goals. Please review the following plan we discussed and let me know if I can assist you in the future.   These are the goals we discussed: Goals    . Patient Stated     I want to continue to eat healthy, exercise by walking the track at the [facility].       This is a list of the screening recommended for you and due dates:  Health Maintenance  Topic Date Due  . Mammogram  06/15/2012  . Tetanus Vaccine  06/20/2019  . Colon Cancer Screening  05/12/2026  . Flu Shot  Completed  . DEXA scan (bone density measurement)  Completed  .  Hepatitis C: One time screening is recommended by Center for Disease Control  (CDC) for  adults born from [birth year] through 1966.   Completed  . Pneumonia vaccines  Completed   Health Maintenance, Female Adopting a healthy lifestyle and getting preventive care can go a long way to promote health and wellness. Talk with your health care provider about what schedule of regular examinations is right for you. This is a good chance for you to check in with your provider about disease prevention and staying healthy. In between checkups, there are plenty of things you can do on your own. Experts have done a lot of research about which lifestyle changes and preventive measures are most likely to keep you healthy. Ask your health care provider for more information. Weight and diet Eat a healthy diet  Be sure to include plenty of vegetables, fruits, low-fat dairy products, and lean protein.  Do not eat a lot of foods high in solid fats,  added sugars, or salt.  Get regular exercise. This is one of the most important things you can do for your health. ? Most adults should exercise for at least 150 minutes each week. The exercise should increase your heart rate and make you sweat (moderate-intensity exercise). ? Most adults should also do strengthening exercises at least twice a week. This is in addition to the moderate-intensity exercise. Maintain a healthy weight  Body mass index (BMI) is a measurement that can be used to identify possible weight problems. It estimates body fat based on height and weight. Your health care provider can help determine your BMI and help you achieve or maintain a healthy weight.  For females 16 years of age and older: ? A BMI below 18.5 is considered underweight. ? A BMI of 18.5 to 24.9 is normal. ? A BMI of 25 to 29.9 is considered overweight. ? A BMI of 30 and above is considered obese. Watch levels of cholesterol and blood lipids  You should start having your blood tested for lipids and cholesterol at 69 years of age, then have this test every 5 years.  You may need to have your cholesterol levels checked more often if: ? Your lipid or cholesterol levels are high. ? You are older than 69 years of age. ? You are at high risk for heart disease. Cancer screening Lung Cancer  Lung cancer screening is recommended for adults 30-34 years old  who are at high risk for lung cancer because of a history of smoking.  A yearly low-dose CT scan of the lungs is recommended for people who: ? Currently smoke. ? Have quit within the past 15 years. ? Have at least a 30-pack-year history of smoking. A pack year is smoking an average of one pack of cigarettes a day for 1 year.  Yearly screening should continue until it has been 15 years since you quit.  Yearly screening should stop if you develop a health problem that would prevent you from having lung cancer treatment. Breast Cancer  Practice breast  self-awareness. This means understanding how your breasts normally appear and feel.  It also means doing regular breast self-exams. Let your health care provider know about any changes, no matter how small.  If you are in your 20s or 30s, you should have a clinical breast exam (CBE) by a health care provider every 1-3 years as part of a regular health exam.  If you are 38 or older, have a CBE every year. Also consider having a breast X-ray (mammogram) every year.  If you have a family history of breast cancer, talk to your health care provider about genetic screening.  If you are at high risk for breast cancer, talk to your health care provider about having an MRI and a mammogram every year.  Breast cancer gene (BRCA) assessment is recommended for women who have family members with BRCA-related cancers. BRCA-related cancers include: ? Breast. ? Ovarian. ? Tubal. ? Peritoneal cancers.  Results of the assessment will determine the need for genetic counseling and BRCA1 and BRCA2 testing. Cervical Cancer Your health care provider may recommend that you be screened regularly for cancer of the pelvic organs (ovaries, uterus, and vagina). This screening involves a pelvic examination, including checking for microscopic changes to the surface of your cervix (Pap test). You may be encouraged to have this screening done every 3 years, beginning at age 72.  For women ages 60-65, health care providers may recommend pelvic exams and Pap testing every 3 years, or they may recommend the Pap and pelvic exam, combined with testing for human papilloma virus (HPV), every 5 years. Some types of HPV increase your risk of cervical cancer. Testing for HPV may also be done on women of any age with unclear Pap test results.  Other health care providers may not recommend any screening for nonpregnant women who are considered low risk for pelvic cancer and who do not have symptoms. Ask your health care provider if a  screening pelvic exam is right for you.  If you have had past treatment for cervical cancer or a condition that could lead to cancer, you need Pap tests and screening for cancer for at least 20 years after your treatment. If Pap tests have been discontinued, your risk factors (such as having a new sexual partner) need to be reassessed to determine if screening should resume. Some women have medical problems that increase the chance of getting cervical cancer. In these cases, your health care provider may recommend more frequent screening and Pap tests. Colorectal Cancer  This type of cancer can be detected and often prevented.  Routine colorectal cancer screening usually begins at 69 years of age and continues through 69 years of age.  Your health care provider may recommend screening at an earlier age if you have risk factors for colon cancer.  Your health care provider may also recommend using home test kits to check for  hidden blood in the stool.  A small camera at the end of a tube can be used to examine your colon directly (sigmoidoscopy or colonoscopy). This is done to check for the earliest forms of colorectal cancer.  Routine screening usually begins at age 51.  Direct examination of the colon should be repeated every 5-10 years through 69 years of age. However, you may need to be screened more often if early forms of precancerous polyps or small growths are found. Skin Cancer  Check your skin from head to toe regularly.  Tell your health care provider about any new moles or changes in moles, especially if there is a change in a mole's shape or color.  Also tell your health care provider if you have a mole that is larger than the size of a pencil eraser.  Always use sunscreen. Apply sunscreen liberally and repeatedly throughout the day.  Protect yourself by wearing long sleeves, pants, a wide-brimmed hat, and sunglasses whenever you are outside. Heart disease, diabetes, and high  blood pressure  High blood pressure causes heart disease and increases the risk of stroke. High blood pressure is more likely to develop in: ? People who have blood pressure in the high end of the normal range (130-139/85-89 mm Hg). ? People who are overweight or obese. ? People who are African American.  If you are 35-37 years of age, have your blood pressure checked every 3-5 years. If you are 49 years of age or older, have your blood pressure checked every year. You should have your blood pressure measured twice-once when you are at a hospital or clinic, and once when you are not at a hospital or clinic. Record the average of the two measurements. To check your blood pressure when you are not at a hospital or clinic, you can use: ? An automated blood pressure machine at a pharmacy. ? A home blood pressure monitor.  If you are between 86 years and 19 years old, ask your health care provider if you should take aspirin to prevent strokes.  Have regular diabetes screenings. This involves taking a blood sample to check your fasting blood sugar level. ? If you are at a normal weight and have a low risk for diabetes, have this test once every three years after 69 years of age. ? If you are overweight and have a high risk for diabetes, consider being tested at a younger age or more often. Preventing infection Hepatitis B  If you have a higher risk for hepatitis B, you should be screened for this virus. You are considered at high risk for hepatitis B if: ? You were born in a country where hepatitis B is common. Ask your health care provider which countries are considered high risk. ? Your parents were born in a high-risk country, and you have not been immunized against hepatitis B (hepatitis B vaccine). ? You have HIV or AIDS. ? You use needles to inject street drugs. ? You live with someone who has hepatitis B. ? You have had sex with someone who has hepatitis B. ? You get hemodialysis  treatment. ? You take certain medicines for conditions, including cancer, organ transplantation, and autoimmune conditions. Hepatitis C  Blood testing is recommended for: ? Everyone born from [birth year] through 1966. ? Anyone with known risk factors for hepatitis C. Sexually transmitted infections (STIs)  You should be screened for sexually transmitted infections (STIs) including gonorrhea and chlamydia if: ? You are sexually active and are younger  than 69 years of age. ? You are older than 69 years of age and your health care provider tells you that you are at risk for this type of infection. ? Your sexual activity has changed since you were last screened and you are at an increased risk for chlamydia or gonorrhea. Ask your health care provider if you are at risk.  If you do not have HIV, but are at risk, it may be recommended that you take a prescription medicine daily to prevent HIV infection. This is called pre-exposure prophylaxis (PrEP). You are considered at risk if: ? You are sexually active and do not regularly use condoms or know the HIV status of your partner(s). ? You take drugs by injection. ? You are sexually active with a partner who has HIV. Talk with your health care provider about whether you are at high risk of being infected with HIV. If you choose to begin PrEP, you should first be tested for HIV. You should then be tested every 3 months for as long as you are taking PrEP. Pregnancy  If you are premenopausal and you may become pregnant, ask your health care provider about preconception counseling.  If you may become pregnant, take 400 to 800 micrograms (mcg) of folic acid every day.  If you want to prevent pregnancy, talk to your health care provider about birth control (contraception). Osteoporosis and menopause  Osteoporosis is a disease in which the bones lose minerals and strength with aging. This can result in serious bone fractures. Your risk for osteoporosis can be  identified using a bone density scan.  If you are 54 years of age or older, or if you are at risk for osteoporosis and fractures, ask your health care provider if you should be screened.  Ask your health care provider whether you should take a calcium or vitamin D supplement to lower your risk for osteoporosis.  Menopause may have certain physical symptoms and risks.  Hormone replacement therapy may reduce some of these symptoms and risks. Talk to your health care provider about whether hormone replacement therapy is right for you. Follow these instructions at home:  Schedule regular health, dental, and eye exams.  Stay current with your immunizations.  Do not use any tobacco products including cigarettes, chewing tobacco, or electronic cigarettes.  If you are pregnant, do not drink alcohol.  If you are breastfeeding, limit how much and how often you drink alcohol.  Limit alcohol intake to no more than 1 drink per day for nonpregnant women. One drink equals 12 ounces of beer, 5 ounces of wine, or 1 ounces of hard liquor.  Do not use street drugs.  Do not share needles.  Ask your health care provider for help if you need support or information about quitting drugs.  Tell your health care provider if you often feel depressed.  Tell your health care provider if you have ever been abused or do not feel safe at home. This information is not intended to replace advice given to you by your health care provider. Make sure you discuss any questions you have with your health care provider. Document Released: 11/16/2010 Document Revised: 10/09/2015 Document Reviewed: 02/04/2015 Elsevier Interactive Patient Education  2019 [employer].

## 2018-06-06 NOTE — Progress Notes (Addendum)
Subjective:   [name] is a 68 y.o. female who presents for an Initial Medicare Annual Wellness Visit.  Review of Systems    No ROS.  Medicare Wellness Visit. Additional risk factors are reflected in the social history.  Cardiac Risk Factors include: advanced age (>63mn, >>76women);dyslipidemia;hypertension Sleep patterns: feels rested on waking, gets up 1-2 times nightly to void and sleeps 8 hours nightly.    Home Safety/Smoke Alarms: Feels safe in home. Smoke alarms in place.  Living environment; residence and Firearm Safety: 1-story house/ trailer, no firearms. Lives alone, no needs for DME, good support system Seat Belt Safety/Bike Helmet: Wears seat belt.     Objective:    Today's Vitals   06/06/18 1641 06/06/18 1644  BP: (!) 141/88   Pulse: 64   Resp: 17   SpO2: 97%   Weight: 158 lb (71.7 kg)   Height: _0  (1.6 m)   PainSc:  2    Body mass index is 27.99 kg/m.  Advanced Directives 06/06/2018 10/11/2017 08/26/2017 08/25/2017 10/13/2016 10/13/2016 05/12/2016  Does Patient Have a Medical Advance Directive? Yes Yes Yes Yes No No Yes  Type of [facility] [place] Does patient want to make changes to medical advance directive? - - No - Patient declined - - - -  Copy of [place]? No - copy requested - Yes No - copy requested - - -  Would patient like information on creating a medical advance directive? - - - - No - Patient declined - -  Pre-existing out of facility DNR order (yellow form or pink MOST form) - - - - - - -  Some encounter information is confidential and restricted. Go to Review Flowsheets activity to see all data.    Current Medications (verified) Outpatient Encounter Medications as of 06/06/2018  Medication Sig  . ALPRAZolam (XANAX) 0.5 MG tablet Take 1 tablet (0.5 mg total) by mouth 2 (two)  times daily as needed for anxiety. (Patient taking differently: Take 0.5 mg by mouth See admin instructions. Take 0.5 mg by mouth up to three times a day as needed for anxiety and 0.5 mg scheduled at bedtime)  . aspirin EC 81 MG tablet Take 81 mg by mouth daily.  . Azelastine HCl 0.15 % SOLN Place 1 spray into the nose daily.  . busPIRone (BUSPAR) 10 MG tablet Take 2 tablets (20 mg total) by mouth 2 (two) times daily.  . Calcium Carb-Cholecalciferol (CALCIUM + D3) 600-800 MG-UNIT TABS Take 1 tablet by mouth daily.  . candesartan (ATACAND) 16 MG tablet TAKE 1 TABLET EVERY DAY -THIS REPLACES TELMISARTAN-  . carvedilol (COREG) 12.5 MG tablet Take 1 tablet (12.5 mg total) by mouth 2 (two) times daily.  . cephALEXin (KEFLEX) 500 MG capsule Take 500 mg by mouth 3 (three) times daily.  . Cholecalciferol (D3-1000) 1000 units capsule Take 1,000 Units by mouth daily.  . Cyanocobalamin (VITAMIN B-12) 1000 MCG SUBL Place 1 tablet (1,000 mcg total) under the tongue daily. (Patient taking differently: Place 2,000 mcg under the tongue daily. )  . cyclobenzaprine (FLEXERIL) 10 MG tablet Take 10 mg by mouth 2 (two) times daily as needed for muscle spasms.   . D-Mannose 500 MG CAPS Take 500 mg by mouth daily. WITH CRANBERRY & DANDELION EXTRACT  . esomeprazole (NEXIUM) 20 MG capsule Take 20 mg by mouth  daily before breakfast.   . estradiol (ESTRACE) 1 MG tablet Take 1 tablet (1 mg total) by mouth daily.  Marland Kitchen FERREX 150 150 MG capsule TAKE 1 CAPSULE BY MOUTH TWICE A DAY  . FLUoxetine (PROZAC) 40 MG capsule Take 1 capsule (40 mg total) by mouth every morning.  . Lactobacillus ([employer]S PROBIOTIC ACIDOPHILUS PO) Take 1 capsule by mouth daily.  Marland Kitchen levothyroxine (SYNTHROID, LEVOTHROID) 100 MCG tablet TAKE 1 TABLET (100 MCG TOTAL) BY MOUTH DAILY BEFORE BREAKFAST.  Marland Kitchen meclizine (ANTIVERT) 25 MG tablet Take 1 tablet (25 mg total) by mouth 3 (three) times daily as needed for dizziness.  . mirtazapine (REMERON) 30 MG tablet Take 1  tablet (30 mg total) by mouth at bedtime.  . Multiple Vitamins-Minerals (MULTIVITAMIN WITH MINERALS) tablet Take 1 tablet by mouth daily.  . nebivolol (BYSTOLIC) 5 MG tablet Take 1 tablet (5 mg total) by mouth daily.  . ondansetron (ZOFRAN-ODT) 4 MG disintegrating tablet Take 4 mg by mouth every 8 (eight) hours as needed for nausea or vomiting.   Vladimir Faster Glyc-Propyl Glyc PF (SYSTANE PRESERVATIVE FREE) 0.4-0.3 % SOLN Apply 2 drops to eye 3 (three) times daily as needed (for irritation).  . progesterone (PROMETRIUM) 100 MG capsule Take 100 mg by mouth at bedtime.  . SUMAtriptan (IMITREX) 100 MG tablet Take 0.5-1 tablets (50-100 mg total) by mouth every 2 (two) hours as needed for migraine. May repeat in 2 hours if headache persists or recurs.  . SUMAtriptan (IMITREX) 6 MG/0.5ML SOLN injection Inject 6 mg into the skin every 2 (two) hours as needed for migraine or headache. Reported on 08/11/2015  . traMADol (ULTRAM) 50 MG tablet Take 50 mg by mouth every 6 (six) hours as needed (for pain).   . triamterene-hydrochlorothiazide (MAXZIDE-25) 37.5-25 MG tablet Take 1 tablet by mouth daily.  . hydrochlorothiazide (MICROZIDE) 12.5 MG capsule Take 1 capsule (12.5 mg total) by mouth daily.  . [DISCONTINUED] amoxicillin-clavulanate (AUGMENTIN) 875-125 MG tablet Take 1 tablet by mouth every 12 (twelve) hours. (Patient not taking: Reported on 06/06/2018)  . [DISCONTINUED] nitrofurantoin, macrocrystal-monohydrate, (MACROBID) 100 MG capsule Take 100 mg by mouth 2 (two) times daily.   No facility-administered encounter medications on file as of 06/06/2018.     Allergies (verified) Buprenorphine hcl; Morphine and related; Other; Codeine; Dust mite extract; and Molds & smuts   History: Past Medical History:  Diagnosis Date  . Abdominal pain, generalized 03/26/2014  . Acute upper respiratory infection 05/05/2016   12/17  . Adaptive colitis 12/05/2013  . Adult hypothyroidism 12/05/2013   On Levothroid  .  Allergic rhinitis, cause unspecified   . Anemia 05/31/2017   Iron def  . Anxiety   . Asthma    chronic cough  . Bipolar I disorder, most recent episode (or current) depressed, severe, without mention of psychotic behavior   . Blood transfusion without reported diagnosis   . 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 Cephalalgia 12/05/2013  . 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 Chest pain, atypical 07/13/2017   2/19 CT abd/chest - ok in June 2018  . Chronic depression 10/13/2016  . Cold intolerance 03/26/2014  . Concussion w/o coma 09/18/2014  . Cough variant asthma  vs UACS/ irritable larynx  03/26/2014   FENO 08/11/2016  =   17 p no rx    . Deep phlebothrombosis, postpartum 02/14/2008   Overview:  Overview:  Annotation: affected mostly left leg. Qualifier: Diagnosis of  By: Linda Hedges MD, Heinz Knuckles   . Deep phlebothrombosis, postpartum,  unspecified as to episode of care(671.40)   . Depression   . Feeling bilious 12/05/2013  . Female genuine stress incontinence 04/12/2012  . Fibromyalgia   . Gastritis   . GERD 03/04/2007   Protonix   . GERD (gastroesophageal reflux disease)   . HTN (hypertension) 05/31/2017   1/19 Verapamil bid 2/19 added Maxzide  . Hyperlipidemia   . IBS (irritable bowel syndrome)   . 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 Irritable bowel syndrome with diarrhea 09/12/2014   Dr Olevia Perches  . IRRITABLE BOWEL SYNDROME, HX OF 03/04/2007   Qualifier: Diagnosis of  By: Quentin Cornwall CMA, Janett Billow    . Laryngopharyngeal reflux (LPR) 06/15/2016  . Leg pain, anterior, left 12/15/2016   8/18 2/19 L  . 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 Lung mass 09/10/2013  . Lung nodule, solitary 09/10/2013  . Malaise and fatigue 04/16/2015  . Menopause 12/05/2013  . Migraine headache   . Mild cognitive disorder 12/05/2013  . MUSCLE PAIN 07/03/2008   Qualifier: Diagnosis of  By: Jenny Reichmann MD, Hunt Oris    . Other and unspecified hyperlipidemia   . Parathyroid adenoma 2014  . Paresthesia 12/15/2016   8/18 L lat foot - ?peroneal nerve damage  . Positive skin test 03/26/2014  . Post concussion syndrome 10/28/2014  . Severe episode of recurrent major depressive disorder, without psychotic features ([place]) 12/18/2014   On Fluoxetine   . Status post chemotherapy 2014  . Syncope 10/13/2016  . Unspecified hypothyroidism 04/25/2013  . Vitamin D deficiency 04/25/2013   Past Surgical History:  Procedure Laterality Date  . BLADDER SURGERY  06/2016   [facility], piece of mesh with necrotic tissue removed  . CESAREAN SECTION  1986  . CHOLECYSTECTOMY N/A 05/03/2013   Procedure: LAPAROSCOPIC CHOLECYSTECTOMY WITH INTRAOPERATIVE CHOLANGIOGRAM;  Surgeon: Odis Hollingshead, MD;  Location: [facility];  Service: General;  Laterality: N/A;  . COLONOSCOPY    . laprotomy  04/2012   for cancer  . NECK SURGERY  2006   C spine fusion  . PUBOVAGINAL SLING    . RIGHT/LEFT HEART CATH AND CORONARY ANGIOGRAPHY N/A 08/26/2017   Procedure: RIGHT/LEFT HEART CATH AND CORONARY ANGIOGRAPHY;  Surgeon: Burnell Blanks, MD;  Location: [facility];  Service: Cardiovascular;  Laterality: N/A;  . VAGINAL HYSTERECTOMY     Family History  Problem Relation Age of Onset  . Coronary artery disease Father   . Heart disease Father   . Hypertension Father   . Irritable bowel syndrome Father   . Arthritis Mother   . Hypertension Mother   . Migraines Mother   . Heart disease Mother   . Diabetes Maternal Grandmother   . Irritable bowel syndrome Sister   . Irritable bowel syndrome Brother   . Colon cancer Neg Hx   . Esophageal cancer Neg Hx   . Rectal cancer Neg Hx   . Stomach cancer Neg Hx    Social History   Socioeconomic History  . Marital status: Divorced    Spouse name: Not on file  . Number of children: 2  . Years of education: 52  . Highest education level: Not on file  Occupational History  . 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 Financial resource strain: Not hard at all  . Food insecurity:    Worry: Never true  Inability: Never true  . Transportation needs:    Medical: No    Non-medical: No  Tobacco Use  . Smoking status: Never Smoker  . Smokeless tobacco: Never Used  Substance and Sexual Activity  . Alcohol use: Yes    Alcohol/week: 2.0 - 3.0 standard drinks    Types: 2 - 3 Standard drinks or equivalent per week    Comment: rarely  . Drug use: No  . Sexual activity: Not Currently    Partners: Male  Lifestyle  . Physical activity:    Days per week: 5 days    Minutes per session: 40 min  . Stress: Not at all  Relationships  . Social connections:    Talks on phone: More than three times a week    Gets together: More than three times a week    Attends religious service: More than 4 times per year    Active member of club or organization: Yes    Attends meetings of clubs or organizations: More than 4 times per year    Relationship status: Divorced  Other Topics Concern  . Not on file  Social History Narrative   Married 4 years- divorced; married 10 years- divorced. Serially monogamous relationships. Remarried March '11. 2 sons- '82, '87.    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 (June "12).    Lives in her own home.  Currently going through a divorce.    Tobacco Counseling Counseling given: Not Answered  Activities of Daily Living In your present state of health, do you have any difficulty performing the following activities: 06/06/2018 08/25/2017  Hearing? N -  Vision? N -  Difficulty concentrating or making decisions? N -  Walking or climbing stairs? N -  Dressing or bathing? N -  Doing errands, shopping? N N  Preparing Food and eating ? N -  Using the Toilet? N -  In the past six months, have you accidently leaked urine? N -  Do you have problems  with loss of bowel control? N -  Managing your Medications? N -  Managing your Finances? N -  Housekeeping or managing your Housekeeping? N -  Some recent data might be hidden   Immunizations and Health Maintenance Immunization History  Administered Date(s) Administered  . H1N1 06/14/2008  . Influenza Whole 02/14/2008, 02/17/2009, 03/17/2013  . Influenza, High Dose Seasonal PF 05/31/2017, 03/16/2018  . Influenza-Unspecified 03/24/2016  . MMR 06/19/2009  . Pneumococcal Conjugate-13 05/05/2016  . Pneumococcal Polysaccharide-23 05/31/2017  . Td 05/17/2000, 06/19/2009  . Zoster 04/16/2014   Health Maintenance Due  Topic Date Due  . MAMMOGRAM  06/15/2012    Patient Care Team: Cassandria Anger, MD as PCP - General (Internal Medicine) Oren Binet, PhD as Consulting Physician (Psychology) Norma Fredrickson, MD as Consulting Physician (Psychiatry) Calvert Cantor, MD as Consulting Physician (Ophthalmology) Ardis Hughs, MD as Attending Physician (Urology)  Indicate any recent Medical Services you may have received from other than [facility] providers in the past year (date may be approximate).     Assessment:   This is a routine wellness examination for [facility]. Physical assessment deferred to PCP.  Hearing/Vision screen Hearing Screening Comments: Able to hear conversational tones w/o difficulty. Passed whisper test Vision Screening Comments: appointment yearly Dr. Bing Plume  Dietary issues and exercise activities discussed: Current Exercise Habits: Structured exercise class;Home exercise routine, Type of exercise: yoga;walking, Time (Minutes): 45, Frequency (Times/Week): 5,  Weekly Exercise (Minutes/Week): 225, Intensity: Mild  Diet (meal preparation, eat out, water intake, caffeinated beverages, dairy products, fruits and vegetables): in general, a "healthy" diet  , well balanced, vegetarian.  eats a variety of fruits and vegetables daily, limits salt, fat/cholesterol,  sugar,carbohydrates,caffeine, drinks 6-8 glasses of water daily.   Goals    . Patient Stated     I want to continue to eat healthy, exercise by walking the track at the [facility].      Depression Screen PHQ 2/9 Scores 06/06/2018 07/13/2017 05/05/2016  PHQ - 2 Score 3 0 0  PHQ- 9 Score 4 - -    Fall Risk Fall Risk  06/06/2018 07/13/2017 05/05/2016 03/20/2015  Falls in the past year? 0 Yes No No  Number falls in past yr: - 2 or more - -  Injury with Fall? - No - -   Cognitive Function:   Montreal Cognitive Assessment  03/20/2015 10/28/2014  Visuospatial/ Executive (0/5) 4 5  Naming (0/3) 3 3  Attention: Read list of digits (0/2) 1 2  Attention: Read list of letters (0/1) 1 1  Attention: Serial 7 subtraction starting at 100 (0/3) 3 3  Language: Repeat phrase (0/2) 1 2  Language : Fluency (0/1) 0 1  Abstraction (0/2) 2 2  Delayed Recall (0/5) 4 5  Orientation (0/6) 6 5  Total 25 29  Adjusted Score (based on education) 25 29     Ad8 score reviewed for issues:  Issues making decisions: no  Less interest in hobbies / activities: no  Repeats questions, stories (family complaining): no  Trouble using ordinary gadgets (microwave, computer, phone):no  Forgets the month or year: no  Mismanaging finances: no  Remembering appts: no  Daily problems with thinking and/or memory: no Ad8 score is= 0  Screening Tests Health Maintenance  Topic Date Due  . MAMMOGRAM  06/15/2012  . TETANUS/TDAP  06/20/2019  . COLONOSCOPY  05/12/2026  . INFLUENZA VACCINE  Completed  . DEXA SCAN  Completed  . Hepatitis C Screening  Completed  . PNA vac Low Risk Adult  Completed     Plan:     Reviewed health maintenance screenings with patient today and relevant education, vaccines, and/or referrals were provided. Patient states she will call [facility] to schedule her screening mammogram.  Continue doing brain stimulating activities (puzzles, reading, adult coloring books, staying active) to keep  memory sharp.   Continue to eat heart healthy diet (full of fruits, vegetables, whole grains, lean protein, water--limit salt, fat, and sugar intake) and increase physical activity as tolerated.  I have personally reviewed and noted the following in the patient's chart:   . Medical and social history . Use of alcohol, tobacco or illicit drugs  . Current medications and supplements . Functional ability and status . Nutritional status . Physical activity . Advanced directives . List of other physicians . Vitals . Screenings to include cognitive, depression, and falls . Referrals and appointments  In addition, I have reviewed and discussed with patient certain preventive protocols, quality metrics, and best practice recommendations. A written personalized care plan for preventive services as well as general preventive health recommendations were provided to patient.     Michiel Cowboy, RN   06/06/2018     Medical screening examination/treatment/procedure(s) were performed by non-physician practitioner and as supervising physician I was immediately available for consultation/collaboration. I agree with above. Lew Dawes, MD

## 2018-06-08 ENCOUNTER — Encounter: Payer: Self-pay | Admitting: Allergy and Immunology

## 2018-06-08 ENCOUNTER — Ambulatory Visit: Payer: Medicare Other | Admitting: Allergy and Immunology

## 2018-06-08 VITALS — BP 132/88 | HR 64 | Resp 10 | Ht 63.0 in | Wt 154.6 lb

## 2018-06-08 DIAGNOSIS — M4726 Other spondylosis with radiculopathy, lumbar region: Secondary | ICD-10-CM | POA: Diagnosis not present

## 2018-06-08 DIAGNOSIS — M5117 Intervertebral disc disorders with radiculopathy, lumbosacral region: Secondary | ICD-10-CM | POA: Diagnosis not present

## 2018-06-08 DIAGNOSIS — K219 Gastro-esophageal reflux disease without esophagitis: Secondary | ICD-10-CM | POA: Diagnosis not present

## 2018-06-08 DIAGNOSIS — B999 Unspecified infectious disease: Secondary | ICD-10-CM

## 2018-06-08 DIAGNOSIS — J4530 Mild persistent asthma, uncomplicated: Secondary | ICD-10-CM

## 2018-06-08 DIAGNOSIS — J3089 Other allergic rhinitis: Secondary | ICD-10-CM

## 2018-06-08 DIAGNOSIS — H04123 Dry eye syndrome of bilateral lacrimal glands: Secondary | ICD-10-CM | POA: Diagnosis not present

## 2018-06-08 DIAGNOSIS — M48061 Spinal stenosis, lumbar region without neurogenic claudication: Secondary | ICD-10-CM | POA: Diagnosis not present

## 2018-06-08 DIAGNOSIS — M5442 Lumbago with sciatica, left side: Secondary | ICD-10-CM | POA: Diagnosis not present

## 2018-06-08 DIAGNOSIS — M5116 Intervertebral disc disorders with radiculopathy, lumbar region: Secondary | ICD-10-CM | POA: Diagnosis not present

## 2018-06-08 MED ORDER — ASMANEX HFA 200 MCG/ACT IN AERO: 2.0000 | INHALATION_SPRAY | Freq: Every day | RESPIRATORY_TRACT | 5 refills | Status: DC

## 2018-06-08 MED ORDER — ALBUTEROL SULFATE HFA 108 (90 BASE) MCG/ACT IN AERS: INHALATION_SPRAY | RESPIRATORY_TRACT | 1 refills | Status: DC

## 2018-06-08 MED ORDER — TRIAMCINOLONE ACETONIDE 55 MCG/ACT NA AERO: INHALATION_SPRAY | NASAL | 5 refills | Status: DC

## 2018-06-08 MED ORDER — ESOMEPRAZOLE MAGNESIUM 40 MG PO CPDR: DELAYED_RELEASE_CAPSULE | ORAL | 5 refills | Status: DC

## 2018-06-08 MED ORDER — FAMOTIDINE 40 MG PO TABS: ORAL_TABLET | ORAL | 5 refills | Status: DC

## 2018-06-08 MED ORDER — IPRATROPIUM BROMIDE 0.06 % NA SOLN: NASAL | 5 refills | Status: DC

## 2018-06-08 NOTE — Progress Notes (Signed)
NEW PATIENT NOTE  Referring Provider: No ref. provider found Primary Provider: Cassandria Anger, MD Date of office visit: 06/08/2018    Subjective:   Chief Complaint:  [name] (DOB: 1951/04/28) is a 68 y.o. female who presents to the clinic on 06/08/2018 with a chief complaint of Sinusitis and Allergic Rhinitis  .  HPI: [name] presents to this clinic in evaluation of several distinct issues.  All of them revolve around her respiratory tract.  [name] states that she has had allergies for a long period in time followed by Dr. Thomasenia Bottoms and subsequently Dr. Hardie Pulley in [place].  Her allergies are manifested as nasal congestion and sneezing especially following exposure to dust.  She has not really found any form of therapy that has helped this issue.  She has tried nasal steroids and has tried montelukast and nothing appears to make a good impact on this issue.  She believes that nasal saline is probably the most effective thing that she can use to help with this issue.  She does not have any associated anosmia.  She does have chronic nasal drip with clear fluid.  [name] also has been developing recurrent issues with "bronchitis" and has been diagnosed with asthma in the past.  She believes that this has been a progressive issue over the course of the past several years.  She has been given a short acting bronchodilator which makes no change regarding the symptoms.  She develops coughing spells where she cannot stop coughing and she feels as though her throat gets smaller and she starts to develop some choking.  She always has some throat clearing and postnasal drip and cannot clear out her throat and on a very rare occasion has intermittent raspy voice.  She can occasionally get chest tightness and shortness of breath during her coughing spells.  Basically this is now a year-round issue.   She also developed a headache and nausea when she start her coughing spells which  appears to occur a few times per week.  She does not have a headache and does not have nausea if she is not coughing.  She does have reflux disease which she believes is under very good control with the use of Nexium 20 mg daily.  She drinks 1 tea per day and does not consume any chocolate or ethanol.  [name] believes that she has received 3 antibiotics and about 4 systemic steroids in 2019 to address these respiratory tract issues.  She also has dry eye syndrome with lots of eye watering for which she uses Systane.    [name] has had endometrial cancer requiring chemotherapy for 4 months in 2014.  Apparently all of her follow-up PET scans have been normal.  Her last chest x-ray she thinks is 2 years ago.  She has had a bone density scan about 1 year ago which is okay.  Past Medical History:  Diagnosis Date  . Abdominal pain, generalized 03/26/2014  . Acute upper respiratory infection 05/05/2016   12/17  . Adaptive colitis 12/05/2013  . Adult hypothyroidism 12/05/2013   On Levothroid  . Allergic rhinitis, cause unspecified   . Anemia 05/31/2017   Iron def  . Anxiety   . Asthma    chronic cough  . Bipolar I disorder, most recent episode (or current) depressed, severe, without mention of psychotic behavior   . Blood transfusion without reported diagnosis   . 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 Cephalalgia 12/05/2013  .  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 Chest pain, atypical 07/13/2017   2/19 CT abd/chest - ok in June 2018  . Chronic depression 10/13/2016  . Cold intolerance 03/26/2014  . Concussion w/o coma 09/18/2014  . Cough variant asthma  vs UACS/ irritable larynx  03/26/2014   FENO 08/11/2016  =   17 p no rx    . Deep phlebothrombosis, postpartum 02/14/2008   Overview:  Overview:  Annotation: affected mostly left leg. Qualifier: Diagnosis of  By: Linda Hedges MD, Heinz Knuckles   . Deep phlebothrombosis, postpartum, unspecified as to episode of care(671.40)   . Depression   . Feeling  bilious 12/05/2013  . Female genuine stress incontinence 04/12/2012  . Fibromyalgia   . Gastritis   . GERD 03/04/2007   Protonix   . GERD (gastroesophageal reflux disease)   . HTN (hypertension) 05/31/2017   1/19 Verapamil bid 2/19 added Maxzide  . Hyperlipidemia   . IBS (irritable bowel syndrome)   . 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 Irritable bowel syndrome with diarrhea 09/12/2014   Dr Olevia Perches  . IRRITABLE BOWEL SYNDROME, HX OF 03/04/2007   Qualifier: Diagnosis of  By: Quentin Cornwall CMA, Janett Billow    . Laryngopharyngeal reflux (LPR) 06/15/2016  . Leg pain, anterior, left 12/15/2016   8/18 2/19 L  . 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 Lung mass 09/10/2013  . Lung nodule, solitary 09/10/2013  . Malaise and fatigue 04/16/2015  . Menopause 12/05/2013  . Migraine headache   . Mild cognitive disorder 12/05/2013  . MUSCLE PAIN 07/03/2008   Qualifier: Diagnosis of  By: Jenny Reichmann MD, Hunt Oris   . Other and unspecified hyperlipidemia   . Parathyroid adenoma 2014  . Paresthesia 12/15/2016   8/18 L lat foot - ?peroneal nerve damage  . Positive skin test 03/26/2014  . Post concussion syndrome 10/28/2014  . Severe episode of recurrent major depressive disorder, without psychotic features ([place]) 12/18/2014   On Fluoxetine   . Status post chemotherapy 2014  . Syncope 10/13/2016  . Unspecified hypothyroidism 04/25/2013  . Vitamin D deficiency 04/25/2013    Past Surgical History:  Procedure Laterality Date  . ANTERIOR FUSION CERVICAL SPINE  2006  . BLADDER SURGERY  06/2016   [facility], piece of mesh with necrotic tissue removed  . CATARACT EXTRACTION, BILATERAL  2019  . CESAREAN SECTION  1986  . CHOLECYSTECTOMY N/A 05/03/2013   Procedure: LAPAROSCOPIC CHOLECYSTECTOMY WITH INTRAOPERATIVE CHOLANGIOGRAM;  Surgeon: Odis Hollingshead, MD;  Location: [facility];  Service: General;  Laterality: N/A;  .  COLONOSCOPY    . herniated discectomy  2019  . laprotomy  04/2012   for cancer  . NECK SURGERY  2006   C spine fusion  . POSTERIOR FUSION CERVICAL SPINE  2016  . PUBOVAGINAL SLING    . RIGHT/LEFT HEART CATH AND CORONARY ANGIOGRAPHY N/A 08/26/2017   Procedure: RIGHT/LEFT HEART CATH AND CORONARY ANGIOGRAPHY;  Surgeon: Burnell Blanks, MD;  Location: [place] [place] [facility];  Service: Cardiovascular;  Laterality: N/A;  . VAGINAL HYSTERECTOMY      Allergies as of 06/08/2018      Reactions   Buprenorphine Hcl Other (See Comments)   Severe headache, confusion   Morphine And Related Other (See Comments)   Severe headache   Other Other (See Comments)   Trees & Grass = allergic symptoms   Codeine Other (See  Comments)   Disorientation   Dust Mite Extract Cough   Molds & Smuts Cough      Medication List      ALPRAZolam 0.5 MG tablet Commonly known as:  XANAX Take 1 tablet (0.5 mg total) by mouth 2 (two) times daily as needed for anxiety.   aspirin EC 81 MG tablet Take 81 mg by mouth daily.   Azelastine HCl 0.15 % Soln Place 1 spray into the nose daily.   busPIRone 10 MG tablet Commonly known as:  BUSPAR Take 2 tablets (20 mg total) by mouth 2 (two) times daily.   CALCIUM + D3 600-800 MG-UNIT Tabs Generic drug:  Calcium Carb-Cholecalciferol Take 1 tablet by mouth daily.   candesartan 16 MG tablet Commonly known as:  ATACAND TAKE 1 TABLET EVERY DAY -THIS REPLACES TELMISARTAN-   carvedilol 12.5 MG tablet Commonly known as:  COREG Take 1 tablet (12.5 mg total) by mouth 2 (two) times daily.   cephALEXin 500 MG capsule Commonly known as:  KEFLEX Take 500 mg by mouth 3 (three) times daily.   CVS PROBIOTIC ACIDOPHILUS PO Take 1 capsule by mouth daily.   cyclobenzaprine 10 MG tablet Commonly known as:  FLEXERIL Take 10 mg by mouth 2 (two) times daily as needed for muscle spasms.   D-Mannose 500 MG Caps Take 500 mg by mouth daily. WITH CRANBERRY & DANDELION EXTRACT     D3-1000 25 MCG (1000 UT) capsule Generic drug:  Cholecalciferol Take 1,000 Units by mouth daily.   esomeprazole 20 MG capsule Commonly known as:  NEXIUM Take 20 mg by mouth daily before breakfast.   estradiol 1 MG tablet Commonly known as:  ESTRACE Take 1 tablet (1 mg total) by mouth daily.   FERREX 150 150 MG capsule Generic drug:  iron polysaccharides TAKE 1 CAPSULE BY MOUTH TWICE A DAY   FLUoxetine 40 MG capsule Commonly known as:  PROZAC Take 1 capsule (40 mg total) by mouth every morning.   hydrochlorothiazide 12.5 MG capsule Commonly known as:  MICROZIDE Take 1 capsule (12.5 mg total) by mouth daily.   levothyroxine 100 MCG tablet Commonly known as:  SYNTHROID, LEVOTHROID TAKE 1 TABLET (100 MCG TOTAL) BY MOUTH DAILY BEFORE BREAKFAST.   meclizine 25 MG tablet Commonly known as:  ANTIVERT Take 1 tablet (25 mg total) by mouth 3 (three) times daily as needed for dizziness.   mirtazapine 30 MG tablet Commonly known as:  REMERON Take 1 tablet (30 mg total) by mouth at bedtime.   montelukast 10 MG tablet Commonly known as:  SINGULAIR every evening.   multivitamin with minerals tablet Take 1 tablet by mouth daily.   nebivolol 5 MG tablet Commonly known as:  BYSTOLIC Take 1 tablet (5 mg total) by mouth daily.   ondansetron 4 MG disintegrating tablet Commonly known as:  ZOFRAN-ODT Take 4 mg by mouth every 8 (eight) hours as needed for nausea or vomiting.   progesterone 100 MG capsule Commonly known as:  PROMETRIUM Take 100 mg by mouth at bedtime.   SUMAtriptan 100 MG tablet Commonly known as:  IMITREX Take 0.5-1 tablets (50-100 mg total) by mouth every 2 (two) hours as needed for migraine. May repeat in 2 hours if headache persists or recurs.   SUMAtriptan 6 MG/0.5ML Soln injection Commonly known as:  IMITREX Inject 6 mg into the skin every 2 (two) hours as needed for migraine or headache. Reported on 08/11/2015   SYSTANE PRESERVATIVE FREE 0.4-0.3 %  Soln Generic drug:  Polyethyl  Glyc-Propyl Glyc PF Apply 2 drops to eye 3 (three) times daily as needed (for irritation).   traMADol 50 MG tablet Commonly known as:  ULTRAM Take 50 mg by mouth every 6 (six) hours as needed (for pain).   triamterene-hydrochlorothiazide 37.5-25 MG tablet Commonly known as:  MAXZIDE-25 Take 1 tablet by mouth daily.   Vitamin B-12 1000 MCG Subl Place 1 tablet (1,000 mcg total) under the tongue daily.       Review of systems negative except as noted in HPI / PMHx or noted below:  Review of Systems  Constitutional: Negative.   HENT: Negative.   Eyes: Negative.   Respiratory: Negative.   Cardiovascular: Negative.   Gastrointestinal: Negative.   Genitourinary: Negative.   Musculoskeletal: Negative.   Skin: Negative.   Neurological: Negative.   Endo/Heme/Allergies: Negative.   Psychiatric/Behavioral: Negative.     Family History  Problem Relation Age of Onset  . Coronary artery disease Father   . Heart disease Father   . Hypertension Father   . Irritable bowel syndrome Father   . Arthritis Mother   . Hypertension Mother   . Migraines Mother   . Heart disease Mother   . Diabetes Maternal Grandmother   . Allergies Maternal Grandmother   . Irritable bowel syndrome Sister   . Irritable bowel syndrome Brother   . Colon cancer Neg Hx   . Esophageal cancer Neg Hx   . Rectal cancer Neg Hx   . Stomach cancer Neg Hx     Social History   Socioeconomic History  . Marital status: Divorced    Spouse name: Not on file  . Number of children: 2  . Years of education: 68  . Highest education level: Not on file  Occupational History  . 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 Financial resource strain: Not hard at all  . Food insecurity:    Worry: Never true    Inability: Never true  . Transportation needs:    Medical: No    Non-medical: No  Tobacco Use  . Smoking status: Never Smoker  . Smokeless  tobacco: Never Used  Substance and Sexual Activity  . Alcohol use: Yes    Alcohol/week: 2.0 - 3.0 standard drinks    Types: 2 - 3 Standard drinks or equivalent per week    Comment: rarely  . Drug use: No  . Sexual activity: Not Currently    Partners: Male  Lifestyle  . Physical activity:    Days per week: 5 days    Minutes per session: 40 min  . Stress: Not at all  Relationships  . Social connections:    Talks on phone: More than three times a week    Gets together: More than three times a week    Attends religious service: More than 4 times per year    Active member of club or organization: Yes    Attends meetings of clubs or organizations: More than 4 times per year    Relationship status: Divorced  . Intimate partner violence:    Fear of current or ex partner: Not on file    Emotionally abused: Not on file    Physically abused: Not on file    Forced sexual activity: Not on file  Other Topics Concern  . Not on file  Social History Narrative   Married 4 years- divorced; married 10 years- divorced. Serially monogamous relationships. Remarried March '11. 2 sons- '82, '87.  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 (June "12).    Lives in her own home.  Currently going through a divorce.    Environmental and Social history  Lives in a house with a dry environment, no animals located in the house, plastic on the bed and pillow, no smoking within the household.   Objective:   Vitals:   06/08/18 0906  BP: 132/88  Pulse: 64  Resp: 10   Height: 5\' 3"  (160 cm) Weight: 154 lb 9.6 oz (70.1 kg)  Physical Exam Constitutional:      Appearance: She is not diaphoretic.     Comments: Throat clearing, coughing  HENT:     Head: Normocephalic. No right periorbital erythema or left periorbital erythema.     Right Ear: Tympanic membrane, ear canal and external ear normal.     Left Ear: Tympanic membrane, ear  canal and external ear normal.     Nose: Nose normal. No mucosal edema or rhinorrhea.     Mouth/Throat:     Pharynx: No oropharyngeal exudate.  Eyes:     General: Lids are normal.     Conjunctiva/sclera: Conjunctivae normal.     Pupils: Pupils are equal, round, and reactive to light.  Neck:     Thyroid: No thyromegaly.     Trachea: Trachea normal. No tracheal deviation.  Cardiovascular:     Rate and Rhythm: Normal rate and regular rhythm.     Heart sounds: Normal heart sounds, S1 normal and S2 normal. No murmur.  Pulmonary:     Effort: Pulmonary effort is normal. No respiratory distress.     Breath sounds: No stridor. No wheezing or rales.  Chest:     Chest wall: No tenderness.  Abdominal:     General: There is no distension.     Palpations: Abdomen is soft. There is no mass.     Tenderness: There is no abdominal tenderness. There is no guarding or rebound.  Musculoskeletal:        General: No tenderness.  Lymphadenopathy:     Head:     Right side of head: No tonsillar adenopathy.     Left side of head: No tonsillar adenopathy.     Cervical: No cervical adenopathy.  Skin:    Coloration: Skin is not pale.     Findings: No erythema or rash.     Nails: There is no clubbing.   Neurological:     Mental Status: She is alert.     Diagnostics: Allergy skin tests were performed.  She demonstrated slight hypersensitivity against mold.  Spirometry was performed and demonstrated an FEV1 of 2.10 @ 94 % of predicted. FEV1/FVC = 0.75. Following the administration of nebulized albuterol her FEV1 rose to 2.25 which was an increase in the FEV1 of 7%.  Results of blood tests obtained 11 Oct 2017 identified WBC 6.8, absolute eosinophil 100, absolute basophil 100, absolute lymphocyte 2100, hemoglobin 12.3, and platelet 340.  Results of a chest x-ray obtained 11 Oct 2017 identified the following:  The heart size and mediastinal contours are within normal limits. There is no evidence of  pulmonary edema, consolidation, pneumothorax, nodule or pleural fluid. The visualized skeletal structures are unremarkable.  Results of a chest CT scan obtained 19 October 2016 identified the following:  Mediastinum/Lymph Nodes: No masses or pathologically enlarged lymph nodes identified.  Lungs/Pleura: No pulmonary infiltrate or mass identified. No effusion present. Stable right  upper lobe calcified granuloma.  Results of a sinus CT scan obtained 20 January 2012 identified the following:  Frontal, ethmoid, maxillary, and sphenoid sinuses remain clear.  No significant sinus opacification, mucosal thickening, or air-fluid level.  Ostiomeatal complex anatomy is patent and symmetric.  Right middle turbinate is aerated consistent with a concha bullosa, a normal variant.  The nasal septum is slightly deviated to the left.  Dental hardware creates artifact.  Symmetric Orbits.  Results of a pulmonary V/Q scan obtained 15 October 2016 identified the following:  Ventilation: No focal ventilation defect.  Perfusion: No wedge shaped peripheral perfusion defects to suggest acute pulmonary embolism.  Results of a ECHO obtained 15 October 2016 identified the following:  - Left ventricle: The cavity size was normal. Wall thickness was   normal. Systolic function was normal. The estimated ejection   fraction was in the range of 60% to 65%. Wall motion was normal;   there were no regional wall motion abnormalities. Left   ventricular diastolic function parameters were normal. - Aortic valve: There was no stenosis. - Mitral valve: There was trivial regurgitation. - Right ventricle: The cavity size was normal. Systolic function   was normal. - Tricuspid valve: Peak RV-RA gradient (S): 24 mm Hg. - Pulmonary arteries: PA peak pressure: 27 mm Hg (S). - Inferior vena cava: The vessel was normal in size. The   respirophasic diameter changes were in the normal range (= 50%),   consistent with normal  central venous pressure.  Assessment and Plan:    1. Not well controlled mild persistent asthma   2. Perennial allergic rhinitis   3. LPRD (laryngopharyngeal reflux disease)   4. Dry eye syndrome of both eyes   5. Recurrent infections     1. Allergen avoidance measures?  2. Treat and prevent inflammation:   A. Asmanex 200 HFA - 2 inhalations one time per day with spacer  B. OTC Nasacort - 1 spray each nostril one time per day  3. Treat and prevent reflux:   A. Eliminate all caffeine consumption  B. Increase Nexium 40mg  in AM  C. Start famotidine 40mg  in PM  4. If needed:   A. Nasal ipratropium 0.06% - 1-2 sprays each nostril every 6 hours  B. OTC Systane eye drops  C. Proair HFA - 2 inhalations every 4-6 hours  5. Do not use any antihistamines  6. Evaluation of throat with ENT  7. Blood - IgA/G/M, anti-pneumo ab, anti-tetanus ab, area two profile  8. Return in 3 weeks or earlier if problem  Korryn does appear to have some inflammation and irritation of her respiratory tract but I am not really sure that atopic disease is the major trigger for this issue.  She does have a history very consistent with reflux irritating her respiratory tract.  And she has certainly received a fair amount of antibiotics in the treatment of her respiratory tract issues suggesting that there could be a defect in her immune system.  We will evaluate and treat these issues as noted above which includes anti-inflammatory agents for her airway, a more aggressive therapy directed against her reflux, and visualization of her laryngeal structures with ENT and a cursory look at her immune systems ability to mount an antigen specific antibody response.  Because of her dry eye syndrome I would recommend that she not use any oral antihistamines.  I did give her some nasal ipratropium to use with some of her chronic rhinorrhea.  I will see her back  in this clinic in 3 weeks or earlier if there is a  problem.  Allena Katz, MD Allergy / Immunology [place]

## 2018-06-08 NOTE — Patient Instructions (Addendum)
  1. Allergen avoidance measures?  2. Treat and prevent inflammation:   A. Asmanex 200 HFA - 2 inhalations one time per day with spacer  B. OTC Nasacort - 1 spray each nostril one time per day  3. Treat and prevent reflux:   A. Eliminate all caffeine consumption  B. Increase Nexium 40mg  in AM  C. Start famotidine 40mg  in PM  4. If needed:   A. Nasal ipratropium 0.06% - 1-2 sprays each nostril every 6 hours  B. OTC Systane eye drops  C. Proair HFA - 2 inhalations every 4-6 hours  5. Do not use any antihistamines  6. Evaluation of throat with ENT  7. Blood - IgA/G/M, anti-pneumo ab, anti-tetanus ab, area two profile  8. Return in 3 weeks or earlier if problem

## 2018-06-12 ENCOUNTER — Encounter: Payer: Self-pay | Admitting: Allergy and Immunology

## 2018-06-13 ENCOUNTER — Other Ambulatory Visit (HOSPITAL_COMMUNITY): Payer: Self-pay | Admitting: Psychiatry

## 2018-06-14 ENCOUNTER — Telehealth: Payer: Self-pay

## 2018-06-14 NOTE — Telephone Encounter (Signed)
Referral has been faxed to their office.

## 2018-06-14 NOTE — Telephone Encounter (Signed)
-----   Message from Vickii Penna, [place] sent at 06/13/2018  1:32 PM EST ----- Regarding: ENT REferral [place]:-)  Can you refer Stanton Kidney to [facility] for Laryngopharyngeal reflux?  Thank yall. Jarrett Soho

## 2018-06-16 ENCOUNTER — Ambulatory Visit (INDEPENDENT_AMBULATORY_CARE_PROVIDER_SITE_OTHER)
Admission: RE | Admit: 2018-06-16 | Discharge: 2018-06-16 | Disposition: A | Discharge status: Home / Self Care | Payer: Medicare Other | Source: Ambulatory Visit | Attending: Nurse Practitioner | Admitting: Nurse Practitioner

## 2018-06-16 ENCOUNTER — Encounter: Payer: Self-pay | Admitting: Nurse Practitioner

## 2018-06-16 ENCOUNTER — Ambulatory Visit (INDEPENDENT_AMBULATORY_CARE_PROVIDER_SITE_OTHER): Payer: Medicare Other | Admitting: Nurse Practitioner

## 2018-06-16 VITALS — BP 96/56 | HR 73 | Temp 97.6°F

## 2018-06-16 DIAGNOSIS — R05 Cough: Secondary | ICD-10-CM

## 2018-06-16 DIAGNOSIS — R0602 Shortness of breath: Secondary | ICD-10-CM

## 2018-06-16 DIAGNOSIS — J45991 Cough variant asthma: Secondary | ICD-10-CM | POA: Diagnosis not present

## 2018-06-16 DIAGNOSIS — R059 Cough, unspecified: Secondary | ICD-10-CM

## 2018-06-16 MED ORDER — LEVALBUTEROL HCL 0.63 MG/3ML IN NEBU
0.6300 mg | INHALATION_SOLUTION | Freq: Once | RESPIRATORY_TRACT | Status: AC
  Administered 2018-06-16: 0.63 mg via RESPIRATORY_TRACT

## 2018-06-16 MED ORDER — AZITHROMYCIN 250 MG PO TABS: ORAL_TABLET | ORAL | 0 refills | Status: DC

## 2018-06-16 MED ORDER — FIRST-DUKES MOUTHWASH MT SUSP: 5.0000 mL | Freq: Four times a day (QID) | OROMUCOSAL | 0 refills | Status: DC | PRN

## 2018-06-16 MED ORDER — PREDNISONE 10 MG PO TABS: ORAL_TABLET | ORAL | 0 refills | Status: DC

## 2018-06-16 MED ORDER — BENZONATATE 100 MG PO CAPS: 100.0000 mg | ORAL_CAPSULE | Freq: Three times a day (TID) | ORAL | 1 refills | Status: DC

## 2018-06-16 NOTE — Assessment & Plan Note (Addendum)
Note: Patient stated that she was weak after chest x ray and needed wheel chair. Vital signs were stable, but blood pressure was low. Offered EMS for generalized weakness, but patient refused. Will treat for bronchitis.   Patient Instructions  Will check chest x ray and call with results Xopenex given in office today Will order azithromycin Will order Prednisone taper Continue Asmanex 200 HFA - 2 inhalations one time per day with spacer Continue OTC Nasacort - 1 spray each nostril one time per day Continue Nexium 40mg  in AM Continue famotidine 40mg  in PM Stay well hydrated Will order tessalon pearls  Cough: NO MINT OR MENTHOL PRODUCTS SO NO COUGH DROPS   USE SUGARLESS CANDY INSTEAD (Jolley ranchers or Stover's or Astronomer) or even ice chips will also do - the key is to swallow to prevent all throat clearing. NO OIL BASED VITAMINS - use powdered substitutes. Please keep referral to ENT as placed by allergy specialist  Follow up: Follow up with Dr. Melvyn Novas at his first available appointment If symptoms worsen over the weekend please go to the ED

## 2018-06-16 NOTE — Progress Notes (Signed)
 @Patient ID: [name], female    DOB: 05/11/1951, 68 y.o.   MRN: [id number]  Chief Complaint  Patient presents with  . Acute Visit    wheezing, increased SOB, chest congestion, dry cough x1 week, progressively getting worse.  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 Wert.  Tests/recent significant events: CXR 06/16/18 - Mild atelectasis of bilateral lungs. No focal pneumonia is identified.  FENO 08/11/2016  =   17 p no rx   Patient last saw Dr. Wert on 08/11/2016: Instructions were: 1. Try off the prometrium for now and the fish oil and continue the nexium Take 30-60 min before first meal of the day (did not do this) 2. For drainage / throat tickle try take CHLORPHENIRAMINE  4 mg - take one every 4 hours as needed (did not do this) 3. GERD (REFLUX)  is an extremely common cause of respiratory symptoms just like yours , many times with no obvious heartburn at all. It can be treated with medication, but also with lifestyle changes including elevation of the head of your bed (ideally with 6 inch  bed blocks),  Smoking cessation, avoidance of late meals, excessive alcohol, and avoid fatty foods, chocolate, peppermint, colas, red wine, and acidic juices such as orange juice.  4. NO MINT OR MENTHOL PRODUCTS SO NO COUGH DROPS. USE SUGARLESS CANDY INSTEAD (Jolley ranchers or Stover's or Life Savers) or even ice chips will also do - the key is to swallow to prevent all throat clearing.  5. NO OIL BASED VITAMINS - use powdered substitutes.(compliant) 6. If not improving after several weeks then rec start gabapentin 100 mg three times daily (did not do this) 7. Please schedule a follow up office visit in 6 weeks, call sooner if needed (did not do this)   OV 06/19/18 - Acute Cough  Patient presents today with cough.  She states that the cough has  been nonproductive.  Symptoms started 06/09/2018.  She states that she had a low-grade fever yesterday.  She is afebrile in the office today.  Dates that she has had some sinus congestion pressure and pain.  She denies any shortness of breath, chest pain, or edema.  Her O2 sats are 96% in the office today on room air.  Patient does state that she feels weak from all the coughing.  States that she is staying well-hydrated.  Appetite has been poor but she has been eating.       Allergies  Allergen Reactions  . Buprenorphine Hcl Other (See Comments)    Severe headache, confusion  . Morphine And Related Other (See Comments)    Severe headache  . Keflex [Cephalexin] Nausea And Vomiting  . Other Other (See Comments)    Trees & Grass = allergic symptoms  . Codeine Other (See Comments)    Disorientation  . Dust Mite Extract Cough  . Molds & Smuts Cough    Immunization History  Administered Date(s) Administered  . H1N1 06/14/2008  . Influenza Whole 02/14/2008, 02/17/2009, 03/17/2013  . Influenza, High Dose Seasonal PF 05/31/2017, 03/16/2018  . Influenza-Unspecified 03/24/2016  . MMR 06/19/2009  . Pneumococcal Conjugate-13 05/05/2016  . Pneumococcal Polysaccharide-23 05/31/2017  . Td 05/17/2000, 06/19/2009  . Zoster 04/16/2014    Past Medical History:  Diagnosis Date  . Abdominal pain, generalized 03/26/2014  . Acute upper respiratory infection 05/05/2016     12/17  . Adaptive colitis 12/05/2013  . Adult hypothyroidism 12/05/2013   On Levothroid  . Allergic rhinitis, cause unspecified   . Anemia 05/31/2017   Iron def  . Anxiety   . Asthma    chronic cough  . Bipolar I disorder, most recent episode (or current) depressed, severe, without mention of psychotic behavior   . Blood transfusion without reported diagnosis   . 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 Cephalalgia 12/05/2013  . 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 Chest pain, atypical 07/13/2017   2/19 CT abd/chest - ok  in June 2018  . Chronic depression 10/13/2016  . Cold intolerance 03/26/2014  . Concussion w/o coma 09/18/2014  . Cough variant asthma  vs UACS/ irritable larynx  03/26/2014   FENO 08/11/2016  =   17 p no rx    . Deep phlebothrombosis, postpartum 02/14/2008   Overview:  Overview:  Annotation: affected mostly left leg. Qualifier: Diagnosis of  By: Norins MD, Michael E   . Deep phlebothrombosis, postpartum, unspecified as to episode of care(671.40)   . Depression   . Feeling bilious 12/05/2013  . Female genuine stress incontinence 04/12/2012  . Fibromyalgia   . Gastritis   . GERD 03/04/2007   Protonix   . GERD (gastroesophageal reflux disease)   . HTN (hypertension) 05/31/2017   1/19 Verapamil bid 2/19 added Maxzide  . Hyperlipidemia   . IBS (irritable bowel syndrome)   . 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 Irritable bowel syndrome with diarrhea 09/12/2014   Dr Brodie  . IRRITABLE BOWEL SYNDROME, HX OF 03/04/2007   Qualifier: Diagnosis of  By: Robinson CMA, Jessica    . Laryngopharyngeal reflux (LPR) 06/15/2016  . Leg pain, anterior, left 12/15/2016   8/18 2/19 L  . 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 Leiomyosarcoma of uterus (HCC)    vaginal wall  . Lung mass 09/10/2013  . Lung nodule, solitary 09/10/2013  . Malaise and fatigue 04/16/2015  . Menopause 12/05/2013  . Migraine headache   . Mild cognitive disorder 12/05/2013  . MUSCLE PAIN 07/03/2008   Qualifier: Diagnosis of  By: John MD, James W   . Other and unspecified hyperlipidemia   . Parathyroid adenoma 2014  . Paresthesia 12/15/2016   8/18 L lat foot - ?peroneal nerve damage  . Positive skin test 03/26/2014  . Post concussion syndrome 10/28/2014  . Severe episode of recurrent major depressive disorder, without psychotic features (HCC) 12/18/2014   On Fluoxetine   . Status post chemotherapy 2014  . Syncope 10/13/2016  . Unspecified hypothyroidism 04/25/2013  . Vitamin D  deficiency 04/25/2013    Tobacco History: Social History   Tobacco Use  Smoking Status Never Smoker  Smokeless Tobacco Never Used   Counseling given: Yes   Outpatient Encounter Medications as of 06/16/2018  Medication Sig  . albuterol (PROAIR HFA) 108 (90 Base) MCG/ACT inhaler Inhale 2 puffs every 4-6 hours as needed for cough, wheeze, shortness of breath or chest tightness  . ALPRAZolam (XANAX) 0.5 MG tablet Take 1 tablet (0.5 mg total) by mouth 2 (two) times daily as needed for anxiety. (Patient taking differently: Take 0.5 mg by mouth See admin instructions. Take 0.5 mg by mouth up to three times a day as needed for anxiety and 0.5 mg scheduled at bedtime)  . ASMANEX HFA 200 MCG/ACT AERO Inhale 2 puffs into the lungs daily. With spacer  . aspirin EC 81 MG   tablet Take 81 mg by mouth daily.  . Azelastine HCl 0.15 % SOLN Place 1 spray into the nose daily.  . busPIRone (BUSPAR) 10 MG tablet Take 2 tablets (20 mg total) by mouth 2 (two) times daily.  . Calcium Carb-Cholecalciferol (CALCIUM + D3) 600-800 MG-UNIT TABS Take 1 tablet by mouth daily.  . candesartan (ATACAND) 16 MG tablet TAKE 1 TABLET EVERY DAY -THIS REPLACES TELMISARTAN-  . carvedilol (COREG) 12.5 MG tablet Take 1 tablet (12.5 mg total) by mouth 2 (two) times daily.  . Cholecalciferol (D3-1000) 1000 units capsule Take 1,000 Units by mouth daily.  . Cyanocobalamin (VITAMIN B-12) 1000 MCG SUBL Place 1 tablet (1,000 mcg total) under the tongue daily. (Patient taking differently: Place 2,000 mcg under the tongue daily. )  . cyclobenzaprine (FLEXERIL) 10 MG tablet Take 10 mg by mouth 2 (two) times daily as needed for muscle spasms.   . D-Mannose 500 MG CAPS Take 500 mg by mouth daily. WITH CRANBERRY & DANDELION EXTRACT  . esomeprazole (NEXIUM) 40 MG capsule Take 1 capsule by mouth daily in the morning  . estradiol (ESTRACE) 1 MG tablet Take 1 tablet (1 mg total) by mouth daily.  . famotidine (PEPCID) 40 MG tablet Take 1 tablet by  mouth daily in the evening  . FERREX 150 150 MG capsule TAKE 1 CAPSULE BY MOUTH TWICE A DAY  . FLUoxetine (PROZAC) 40 MG capsule Take 1 capsule (40 mg total) by mouth every morning.  . hydrochlorothiazide (MICROZIDE) 12.5 MG capsule Take 1 capsule (12.5 mg total) by mouth daily.  Marland Kitchen ipratropium (ATROVENT) 0.06 % nasal spray Use 1-2 sprays in each nostril every 6 hours as needed  . Lactobacillus ([employer] PROBIOTIC ACIDOPHILUS PO) Take 1 capsule by mouth daily.  Marland Kitchen levothyroxine (SYNTHROID, LEVOTHROID) 100 MCG tablet TAKE 1 TABLET (100 MCG TOTAL) BY MOUTH DAILY BEFORE BREAKFAST.  Marland Kitchen meclizine (ANTIVERT) 25 MG tablet Take 1 tablet (25 mg total) by mouth 3 (three) times daily as needed for dizziness.  . mirtazapine (REMERON) 30 MG tablet Take 1 tablet (30 mg total) by mouth at bedtime.  . montelukast (SINGULAIR) 10 MG tablet every evening.  . Multiple Vitamins-Minerals (MULTIVITAMIN WITH MINERALS) tablet Take 1 tablet by mouth daily.  . nebivolol (BYSTOLIC) 5 MG tablet Take 1 tablet (5 mg total) by mouth daily.  . ondansetron (ZOFRAN-ODT) 4 MG disintegrating tablet Take 4 mg by mouth every 8 (eight) hours as needed for nausea or vomiting.   Vladimir Faster Glyc-Propyl Glyc PF (SYSTANE PRESERVATIVE FREE) 0.4-0.3 % SOLN Apply 2 drops to eye 3 (three) times daily as needed (for irritation).  . progesterone (PROMETRIUM) 100 MG capsule Take 100 mg by mouth at bedtime.  . SUMAtriptan (IMITREX) 100 MG tablet Take 0.5-1 tablets (50-100 mg total) by mouth every 2 (two) hours as needed for migraine. May repeat in 2 hours if headache persists or recurs.  . SUMAtriptan (IMITREX) 6 MG/0.5ML SOLN injection Inject 6 mg into the skin every 2 (two) hours as needed for migraine or headache. Reported on 08/11/2015  . traMADol (ULTRAM) 50 MG tablet Take 50 mg by mouth every 6 (six) hours as needed (for pain).   . triamcinolone (NASACORT ALLERGY 24HR) 55 MCG/ACT AERO nasal inhaler Use 1 spray in each nostril once daily  .  triamterene-hydrochlorothiazide (MAXZIDE-25) 37.5-25 MG tablet Take 1 tablet by mouth daily.  Marland Kitchen azithromycin (ZITHROMAX) 250 MG tablet Take 2 tablets (500 mg) on day 1, then take 1 tablet (250 mg) on days 2-5  .  benzonatate (TESSALON) 100 MG capsule Take 1 capsule (100 mg total) by mouth 3 (three) times daily.  . predniSONE (DELTASONE) 10 MG tablet Take 4 tabs for 2 days, then 3 tabs for 2 days, then 2 tabs for 2 days, then 1 tab for 2 days, then stop  . [DISCONTINUED] cephALEXin (KEFLEX) 500 MG capsule Take 500 mg by mouth 3 (three) times daily.  . [DISCONTINUED] Diphenhyd-Hydrocort-Nystatin (FIRST-DUKES MOUTHWASH) SUSP Use as directed 5 mLs in the mouth or throat 4 (four) times daily as needed for up to 7 days.  . [DISCONTINUED] esomeprazole (NEXIUM) 20 MG capsule Take 20 mg by mouth daily before breakfast.   . [EXPIRED] levalbuterol (XOPENEX) nebulizer solution 0.63 mg    No facility-administered encounter medications on file as of 06/16/2018.      Review of Systems  Review of Systems  Constitutional: Positive for activity change, appetite change, chills and fever.  HENT: Positive for sinus pressure and sinus pain.   Respiratory: Positive for cough. Negative for shortness of breath.   Cardiovascular: Negative.  Negative for chest pain, palpitations and leg swelling.  Gastrointestinal: Negative.   Allergic/Immunologic: Negative.   Neurological: Negative.   Psychiatric/Behavioral: Negative.        Physical Exam  BP (!) 96/56 (BP Location: Right Arm, Cuff Size: Normal)   Pulse 73   Temp 97.6 F (36.4 C) (Oral)   SpO2 96%   Wt Readings from Last 5 Encounters:  06/08/18 154 lb 9.6 oz (70.1 kg)  06/06/18 158 lb (71.7 kg)  02/16/18 157 lb 6.4 oz (71.4 kg)  10/11/17 149 lb (67.6 kg)  09/27/17 153 lb 1.9 oz (69.5 kg)     Physical Exam Vitals signs and nursing note reviewed.  Constitutional:      General: She is not in acute distress.    Appearance: She is well-developed.    Cardiovascular:     Rate and Rhythm: Normal rate and regular rhythm.  Pulmonary:     Effort: Pulmonary effort is normal. No respiratory distress.     Breath sounds: Normal breath sounds. No wheezing or rhonchi.  Neurological:     Mental Status: She is alert and oriented to person, place, and time.     Imaging: Dg Chest 2 View  Result Date: 06/16/2018 CLINICAL DATA:  Wheezing and shortness of breath EXAM: CHEST - 2 VIEW COMPARISON:  Oct 11, 2017 FINDINGS: The heart size and mediastinal contours are within normal limits. Mild linear atelectasis is identified in bilateral mid to lower lung. There is no focal pneumonia, pulmonary edema, or pleural effusion. The visualized skeletal structures are stable. IMPRESSION: Mild atelectasis of bilateral lungs. No focal pneumonia is identified. Electronically Signed   By: Wei-Chen  Lin M.D.   On: 06/16/2018 10:51     Assessment & Plan:   Cough variant asthma  vs UACS/ irritable larynx  Note: Patient stated that she was weak after chest x ray and needed wheel chair. Vital signs were stable, but blood pressure was low. Offered EMS for generalized weakness, but patient refused. Will treat for bronchitis.   Patient Instructions  Will check chest x ray and call with results Xopenex given in office today Will order azithromycin Will order Prednisone taper Continue Asmanex 200 HFA - 2 inhalations one time per day with spacer Continue OTC Nasacort - 1 spray each nostril one time per day Continue Nexium 40mg in AM Continue famotidine 40mg in PM Stay well hydrated Will order tessalon pearls  Cough: NO MINT OR MENTHOL   PRODUCTS SO NO COUGH DROPS   USE SUGARLESS CANDY INSTEAD (Jolley ranchers or Stover's or Life Savers) or even ice chips will also do - the key is to swallow to prevent all throat clearing. NO OIL BASED VITAMINS - use powdered substitutes. Please keep referral to ENT as placed by allergy specialist  Follow up: Follow up with Dr. Wert  at his first available appointment If symptoms worsen over the weekend please go to the ED          S , NP 06/19/2018  

## 2018-06-16 NOTE — Patient Instructions (Addendum)
Will check chest x ray and call with results Xopenex given in office today Will order azithromycin Will order Prednisone taper Continue Asmanex 200 HFA - 2 inhalations one time per day with spacer Continue OTC Nasacort - 1 spray each nostril one time per day Continue Nexium 40mg  in AM Continue famotidine 40mg  in PM Stay well hydrated Will order tessalon pearls  Cough: NO MINT OR MENTHOL PRODUCTS SO NO COUGH DROPS   USE SUGARLESS CANDY INSTEAD (Jolley ranchers or Stover's or Astronomer) or even ice chips will also do - the key is to swallow to prevent all throat clearing. NO OIL BASED VITAMINS - use powdered substitutes. Please keep referral to ENT as placed by allergy specialist  Follow up: Follow up with Dr. Melvyn Novas at his first available appointment If symptoms worsen over the weekend please go to the ED

## 2018-06-17 LAB — ALLERGENS W/TOTAL IGE AREA 2
Alternaria Alternata IgE: <0.1 kU/L
Aspergillus Fumigatus IgE: <0.1 kU/L
Bermuda Grass IgE: <0.1 kU/L
Cat Dander IgE: <0.1 kU/L
Cedar, Mountain IgE: <0.1 kU/L
Cladosporium Herbarum IgE: <0.1 kU/L
Cockroach, German IgE: <0.1 kU/L
Common Silver Birch IgE: <0.1 kU/L
Cottonwood IgE: <0.1 kU/L
D Farinae IgE: <0.1 kU/L
D Pteronyssinus IgE: <0.1 kU/L
Dog Dander IgE: <0.1 kU/L
Elm, American IgE: <0.1 kU/L
IgE (Immunoglobulin E), Serum: 2 IU/mL — ABNORMAL LOW (ref 6–495)
Johnson Grass IgE: <0.1 kU/L
Maple/Box Elder IgE: <0.1 kU/L
Mouse Urine IgE: <0.1 kU/L
Oak, White IgE: <0.1 kU/L
Pecan, Hickory IgE: <0.1 kU/L
Penicillium Chrysogen IgE: <0.1 kU/L
Pigweed, Rough IgE: <0.1 kU/L
Ragweed, Short IgE: <0.1 kU/L
Sheep Sorrel IgE Qn: <0.1 kU/L
Timothy Grass IgE: <0.1 kU/L
White Mulberry IgE: <0.1 kU/L

## 2018-06-17 LAB — STREP PNEUMONIAE 23 SEROTYPES IGG
Pneumo Ab Type 1*: 5.1 ug/mL (ref >1.3)
Pneumo Ab Type 12 (12F)*: 2.3 ug/mL (ref >1.3)
Pneumo Ab Type 14*: >29.1 ug/mL (ref >1.3)
Pneumo Ab Type 17 (17F)*: >30.3 ug/mL (ref >1.3)
Pneumo Ab Type 19 (19F)*: 38.8 ug/mL (ref >1.3)
Pneumo Ab Type 2*: 3.3 ug/mL (ref >1.3)
Pneumo Ab Type 20*: 17.4 ug/mL (ref >1.3)
Pneumo Ab Type 22 (22F)*: 3.2 ug/mL (ref >1.3)
Pneumo Ab Type 23 (23F)*: 1.5 ug/mL (ref >1.3)
Pneumo Ab Type 26 (6B)*: 10.8 ug/mL (ref >1.3)
Pneumo Ab Type 3*: 49.8 ug/mL (ref >1.3)
Pneumo Ab Type 34 (10A)*: >28.8 ug/mL (ref >1.3)
Pneumo Ab Type 4*: 3.6 ug/mL (ref >1.3)
Pneumo Ab Type 43 (11A)*: 3.6 ug/mL (ref >1.3)
Pneumo Ab Type 5*: 5.5 ug/mL (ref >1.3)
Pneumo Ab Type 51 (7F)*: 4.3 ug/mL (ref >1.3)
Pneumo Ab Type 54 (15B)*: >32.9 ug/mL (ref >1.3)
Pneumo Ab Type 56 (18C)*: >16.7 ug/mL (ref >1.3)
Pneumo Ab Type 57 (19A)*: 19.9 ug/mL (ref >1.3)
Pneumo Ab Type 68 (9V)*: >21.2 ug/mL (ref >1.3)
Pneumo Ab Type 70 (33F)*: >14.4 ug/mL (ref >1.3)
Pneumo Ab Type 8*: 2.9 ug/mL (ref >1.3)
Pneumo Ab Type 9 (9N)*: >34.6 ug/mL (ref >1.3)

## 2018-06-17 LAB — IGG, IGA, IGM
IgA/Immunoglobulin A, Serum: 140 mg/dL (ref 87–352)
IgG (Immunoglobin G), Serum: 959 mg/dL (ref 700–1600)
IgM (Immunoglobulin M), Srm: 109 mg/dL (ref 26–217)

## 2018-06-17 LAB — TETANUS ANTIBODY, IGG: Tetanus Ab, IgG: 1.99 IU/mL (ref <0.10)

## 2018-06-19 ENCOUNTER — Ambulatory Visit (INDEPENDENT_AMBULATORY_CARE_PROVIDER_SITE_OTHER): Payer: Medicare Other | Admitting: Psychology

## 2018-06-19 ENCOUNTER — Encounter: Payer: Self-pay | Admitting: Nurse Practitioner

## 2018-06-19 DIAGNOSIS — F4321 Adjustment disorder with depressed mood: Secondary | ICD-10-CM

## 2018-06-19 NOTE — Progress Notes (Signed)
Chart and office note reviewed in detail  > agree with a/p as outlined    

## 2018-06-20 ENCOUNTER — Encounter: Payer: Self-pay | Admitting: Internal Medicine

## 2018-06-21 ENCOUNTER — Institutional Professional Consult (permissible substitution): Payer: Self-pay | Admitting: Internal Medicine

## 2018-06-21 NOTE — Telephone Encounter (Signed)
Pt called back and was given lab results.

## 2018-06-26 ENCOUNTER — Other Ambulatory Visit (HOSPITAL_COMMUNITY): Payer: Self-pay | Admitting: Psychiatry

## 2018-06-26 DIAGNOSIS — Z961 Presence of intraocular lens: Secondary | ICD-10-CM | POA: Diagnosis not present

## 2018-06-28 ENCOUNTER — Ambulatory Visit: Payer: Medicare Other | Admitting: Allergy and Immunology

## 2018-06-28 ENCOUNTER — Encounter: Payer: Self-pay | Admitting: Allergy and Immunology

## 2018-06-28 VITALS — BP 128/90 | HR 75 | Resp 16

## 2018-06-28 DIAGNOSIS — J4530 Mild persistent asthma, uncomplicated: Secondary | ICD-10-CM | POA: Diagnosis not present

## 2018-06-28 DIAGNOSIS — B999 Unspecified infectious disease: Secondary | ICD-10-CM | POA: Diagnosis not present

## 2018-06-28 DIAGNOSIS — H04123 Dry eye syndrome of bilateral lacrimal glands: Secondary | ICD-10-CM

## 2018-06-28 DIAGNOSIS — K219 Gastro-esophageal reflux disease without esophagitis: Secondary | ICD-10-CM | POA: Diagnosis not present

## 2018-06-28 DIAGNOSIS — M5442 Lumbago with sciatica, left side: Secondary | ICD-10-CM | POA: Diagnosis not present

## 2018-06-28 NOTE — Progress Notes (Signed)
Follow-up Note  Referring Provider: Plotnikov, [name], MD Primary Provider: Cassandria Anger, MD Date of Office Visit: 06/28/2018  Subjective:   [name] (DOB: 1951/03/11) is a 68 y.o. female who returns to the [facility] [place] on 06/28/2018 in re-evaluation of the following:  HPI: Avian returns to this clinic in reevaluation of her respiratory tract problems with contributions from respiratory tract inflammation and reflux induced respiratory injury disease addressed during her initial evaluation of 08 June 2018.  She is so much better at this point in time.  She has no cough and no throat clearing and no sore throat and no chest tightness and no shortness of breath.  She has eliminated caffeine and chocolate and alcohol.  She continues to use therapy directed against inflammation and reflux.  She is now sleeping on a wedge.  She did develop an episode of chills and constitutional symptoms with aching and feeling bad and a low-grade fever along with cough for which she went to see the pulmonology group who treated her with a Z-Pak and gave her prednisone but she only used 2 days of prednisone because it gives rise to significant CNS side effects.  All the symptoms resolved within days and she just feels great at this point.  Allergies as of 06/28/2018      Reactions   Buprenorphine Hcl Other (See Comments)   Severe headache, confusion   Morphine And Related Other (See Comments)   Severe headache   Keflex [cephalexin] Nausea And Vomiting   Other Other (See Comments)   Trees & Grass = allergic symptoms   Codeine Other (See Comments)   Disorientation   Dust Mite Extract Cough   Molds & Smuts Cough      Medication List       albuterol 108 (90 Base) MCG/ACT inhaler Commonly known as:  PROAIR HFA Inhale 2 puffs every 4-6 hours as needed for cough, wheeze, shortness of breath or chest tightness   ALPRAZolam 0.5 MG tablet Commonly known as:   XANAX Take 1 tablet (0.5 mg total) by mouth 2 (two) times daily as needed for anxiety.   ASMANEX HFA 200 MCG/ACT Aero Generic drug:  Mometasone Furoate Inhale 2 puffs into the lungs daily. With spacer   aspirin EC 81 MG tablet Take 81 mg by mouth daily.   benzonatate 100 MG capsule Commonly known as:  TESSALON Take 1 capsule (100 mg total) by mouth 3 (three) times daily.   busPIRone 10 MG tablet Commonly known as:  BUSPAR Take 2 tablets (20 mg total) by mouth 2 (two) times daily.   CALCIUM + D3 600-800 MG-UNIT Tabs Generic drug:  Calcium Carb-Cholecalciferol Take 1 tablet by mouth daily.   candesartan 16 MG tablet Commonly known as:  ATACAND TAKE 1 TABLET EVERY DAY -THIS REPLACES TELMISARTAN-   carvedilol 12.5 MG tablet Commonly known as:  COREG Take 1 tablet (12.5 mg total) by mouth 2 (two) times daily.   [employer] PROBIOTIC ACIDOPHILUS PO Take 1 capsule by mouth daily.   cyclobenzaprine 10 MG tablet Commonly known as:  FLEXERIL Take 10 mg by mouth 2 (two) times daily as needed for muscle spasms.   D-Mannose 500 MG Caps Take 500 mg by mouth daily. WITH CRANBERRY & DANDELION EXTRACT   D3-1000 25 MCG (1000 UT) capsule Generic drug:  Cholecalciferol Take 1,000 Units by mouth daily.   esomeprazole 40 MG capsule Commonly known as:  NEXIUM Take 1 capsule by mouth daily  in the morning   estradiol 1 MG tablet Commonly known as:  ESTRACE Take 1 tablet (1 mg total) by mouth daily.   famotidine 40 MG tablet Commonly known as:  PEPCID Take 1 tablet by mouth daily in the evening   FERREX 150 150 MG capsule Generic drug:  iron polysaccharides TAKE 1 CAPSULE BY MOUTH TWICE A DAY   FLUoxetine 40 MG capsule Commonly known as:  PROZAC Take 1 capsule (40 mg total) by mouth every morning.   hydrochlorothiazide 12.5 MG capsule Commonly known as:  MICROZIDE Take 1 capsule (12.5 mg total) by mouth daily.   ipratropium 0.06 % nasal spray Commonly known as:  ATROVENT Use 1-2  sprays in each nostril every 6 hours as needed   levothyroxine 100 MCG tablet Commonly known as:  SYNTHROID, LEVOTHROID TAKE 1 TABLET (100 MCG TOTAL) BY MOUTH DAILY BEFORE BREAKFAST.   meclizine 25 MG tablet Commonly known as:  ANTIVERT Take 1 tablet (25 mg total) by mouth 3 (three) times daily as needed for dizziness.   mirtazapine 30 MG tablet Commonly known as:  REMERON Take 1 tablet (30 mg total) by mouth at bedtime.   montelukast 10 MG tablet Commonly known as:  SINGULAIR every evening.   multivitamin with minerals tablet Take 1 tablet by mouth daily.   nebivolol 5 MG tablet Commonly known as:  BYSTOLIC Take 1 tablet (5 mg total) by mouth daily.   ondansetron 4 MG disintegrating tablet Commonly known as:  ZOFRAN-ODT Take 4 mg by mouth every 8 (eight) hours as needed for nausea or vomiting.   progesterone 100 MG capsule Commonly known as:  PROMETRIUM Take 100 mg by mouth at bedtime.   SUMAtriptan 100 MG tablet Commonly known as:  IMITREX Take 0.5-1 tablets (50-100 mg total) by mouth every 2 (two) hours as needed for migraine. May repeat in 2 hours if headache persists or recurs.   SUMAtriptan 6 MG/0.5ML Soln injection Commonly known as:  IMITREX Inject 6 mg into the skin every 2 (two) hours as needed for migraine or headache. Reported on 08/11/2015   SYSTANE PRESERVATIVE FREE 0.4-0.3 % Soln Generic drug:  Polyethyl Glyc-Propyl Glyc PF Apply 2 drops to eye 3 (three) times daily as needed (for irritation).   traMADol 50 MG tablet Commonly known as:  ULTRAM Take 50 mg by mouth every 6 (six) hours as needed (for pain).   triamcinolone 55 MCG/ACT Aero nasal inhaler Commonly known as:  NASACORT ALLERGY 24HR Use 1 spray in each nostril once daily   triamterene-hydrochlorothiazide 37.5-25 MG tablet Commonly known as:  MAXZIDE-25 Take 1 tablet by mouth daily.   Vitamin B-12 1000 MCG Subl Place 1 tablet (1,000 mcg total) under the tongue daily.       Past  Medical History:  Diagnosis Date  . Abdominal pain, generalized 03/26/2014  . Acute upper respiratory infection 05/05/2016   12/17  . Adaptive colitis 12/05/2013  . Adult hypothyroidism 12/05/2013   On Levothroid  . Allergic rhinitis, cause unspecified   . Anemia 05/31/2017   Iron def  . Anxiety   . Asthma    chronic cough  . Bipolar I disorder, most recent episode (or current) depressed, severe, without mention of psychotic behavior   . Blood transfusion without reported diagnosis   . 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 Cephalalgia 12/05/2013  . 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 Chest pain, atypical 07/13/2017   2/19 CT abd/chest - ok in June 2018  . Chronic depression 10/13/2016  .  Cold intolerance 03/26/2014  . Concussion w/o coma 09/18/2014  . Cough variant asthma  vs UACS/ irritable larynx  03/26/2014   FENO 08/11/2016  =   17 p no rx    . Deep phlebothrombosis, postpartum 02/14/2008   Overview:  Overview:  Annotation: affected mostly left leg. Qualifier: Diagnosis of  By: Linda Hedges MD, Heinz Knuckles   . Deep phlebothrombosis, postpartum, unspecified as to episode of care(671.40)   . Depression   . Feeling bilious 12/05/2013  . Female genuine stress incontinence 04/12/2012  . Fibromyalgia   . Gastritis   . GERD 03/04/2007   Protonix   . GERD (gastroesophageal reflux disease)   . HTN (hypertension) 05/31/2017   1/19 Verapamil bid 2/19 added Maxzide  . Hyperlipidemia   . IBS (irritable bowel syndrome)   . 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 Irritable bowel syndrome with diarrhea 09/12/2014   Dr Olevia Perches  . IRRITABLE BOWEL SYNDROME, HX OF 03/04/2007   Qualifier: Diagnosis of  By: Quentin Cornwall CMA, Janett Billow    . Laryngopharyngeal reflux (LPR) 06/15/2016  . Leg pain, anterior, left 12/15/2016   8/18 2/19 L  . 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 Lung mass 09/10/2013  . Lung  nodule, solitary 09/10/2013  . Malaise and fatigue 04/16/2015  . Menopause 12/05/2013  . Migraine headache   . Mild cognitive disorder 12/05/2013  . MUSCLE PAIN 07/03/2008   Qualifier: Diagnosis of  By: Jenny Reichmann MD, Hunt Oris   . Other and unspecified hyperlipidemia   . Parathyroid adenoma 2014  . Paresthesia 12/15/2016   8/18 L lat foot - ?peroneal nerve damage  . Positive skin test 03/26/2014  . Post concussion syndrome 10/28/2014  . Severe episode of recurrent major depressive disorder, without psychotic features ([place]) 12/18/2014   On Fluoxetine   . Status post chemotherapy 2014  . Syncope 10/13/2016  . Unspecified hypothyroidism 04/25/2013  . Vitamin D deficiency 04/25/2013    Past Surgical History:  Procedure Laterality Date  . ANTERIOR FUSION CERVICAL SPINE  2006  . BLADDER SURGERY  06/2016   [facility], piece of mesh with necrotic tissue removed  . CATARACT EXTRACTION, BILATERAL  2019  . CESAREAN SECTION  1986  . CHOLECYSTECTOMY N/A 05/03/2013   Procedure: LAPAROSCOPIC CHOLECYSTECTOMY WITH INTRAOPERATIVE CHOLANGIOGRAM;  Surgeon: Odis Hollingshead, MD;  Location: [facility];  Service: General;  Laterality: N/A;  . COLONOSCOPY    . herniated discectomy  2019  . laprotomy  04/2012   for cancer  . NECK SURGERY  2006   C spine fusion  . POSTERIOR FUSION CERVICAL SPINE  2016  . PUBOVAGINAL SLING    . RIGHT/LEFT HEART CATH AND CORONARY ANGIOGRAPHY N/A 08/26/2017   Procedure: RIGHT/LEFT HEART CATH AND CORONARY ANGIOGRAPHY;  Surgeon: Burnell Blanks, MD;  Location: [facility];  Service: Cardiovascular;  Laterality: N/A;  . VAGINAL HYSTERECTOMY      Review of systems negative except as noted in HPI / PMHx or noted below:  Review of Systems  Constitutional: Negative.   HENT: Negative.   Eyes: Negative.   Respiratory: Negative.   Cardiovascular: Negative.   Gastrointestinal: Negative.   Genitourinary: Negative.   Musculoskeletal: Negative.   Skin: Negative.    Neurological: Negative.   Endo/Heme/Allergies: Negative.   Psychiatric/Behavioral: Negative.      Objective:   Vitals:   06/28/18 1347  BP: 128/90  Pulse: 75  Resp: 16          Physical Exam Constitutional:      Appearance: She is not diaphoretic.  HENT:     Head: Normocephalic.     Right Ear: Tympanic membrane, ear canal and external ear normal.     Left Ear: Tympanic membrane, ear canal and external ear normal.     Nose: Nose normal. No mucosal edema or rhinorrhea.     Mouth/Throat:     Pharynx: Uvula midline. No oropharyngeal exudate.  Eyes:     Conjunctiva/sclera: Conjunctivae normal.  Neck:     Thyroid: No thyromegaly.     Trachea: Trachea normal. No tracheal tenderness or tracheal deviation.  Cardiovascular:     Rate and Rhythm: Normal rate and regular rhythm.     Heart sounds: Normal heart sounds, S1 normal and S2 normal. No murmur.  Pulmonary:     Effort: No respiratory distress.     Breath sounds: Normal breath sounds. No stridor. No wheezing or rales.  Lymphadenopathy:     Head:     Right side of head: No tonsillar adenopathy.     Left side of head: No tonsillar adenopathy.     Cervical: No cervical adenopathy.  Skin:    Findings: No erythema or rash.     Nails: There is no clubbing.   Neurological:     Mental Status: She is alert.     Diagnostics:    Spirometry was performed and demonstrated an FEV1 of 2.11 at 95 % of predicted.  Results of blood tests obtained 08 June 2018 identified IgG 9 5 3  mg/DL, IgM 109 mg/DL, IgA 140 mg/DL, IgE 2 IU/mL, and no IgE antibodies directed against a screening panel of aeroallergens, and adequate levels of antibodies directed against multiple serotypes of pneumococcus.   Results of a chest x-ray obtained 16 June 2018 identified the following:  The heart size and mediastinal contours are within normal limits. Mild linear atelectasis is identified in bilateral mid to lower lung. There is no focal  pneumonia, pulmonary edema, or pleural effusion. The visualized skeletal structures are stable.  Assessment and Plan:   1. Asthma, well controlled, mild persistent   2. LPRD (laryngopharyngeal reflux disease)   3. Dry eye syndrome of both eyes   4. Recurrent infections     1. Treat and prevent inflammation:   A. Asmanex 200 HFA - 2 inhalations one time per day with spacer  B. OTC Nasacort - 1 spray each nostril one time per day  2. Treat and prevent reflux:   A. Eliminate all caffeine consumption  B. Nexium 40mg  in AM  C. famotidine 40mg  in PM  3. If needed:   A. Nasal ipratropium 0.06% - 1-2 sprays each nostril every 6 hours  B. OTC Systane eye drops  C. Proair HFA - 2 inhalations every 4-6 hours  4. Do not use any antihistamines  5. Evaluation of throat with ENT  6.  Return to clinic in 12 weeks or earlier if problem  Mercades appears to be doing better.  I think that the major insult to her respiratory tract based upon all of her diagnostic results and her response to empiric therapy was reflux.  We will continue to have her utilize the therapy mentioned above which includes anti-inflammatory agents for airway and therapy directed against reflux and she can have evaluation of her throat with ENT just to make sure that everything is okay and I will see her  back in this clinic in 12 weeks or earlier if there is a problem.  Allena Katz, MD Allergy / Immunology [place]

## 2018-06-28 NOTE — Patient Instructions (Addendum)
  1. Treat and prevent inflammation:   A. Asmanex 200 HFA - 2 inhalations one time per day with spacer  B. OTC Nasacort - 1 spray each nostril one time per day  2. Treat and prevent reflux:   A. Eliminate all caffeine consumption  B. Nexium 40mg  in AM  C. famotidine 40mg  in PM  3. If needed:   A. Nasal ipratropium 0.06% - 1-2 sprays each nostril every 6 hours  B. OTC Systane eye drops  C. Proair HFA - 2 inhalations every 4-6 hours  4. Do not use any antihistamines  5. Evaluation of throat with ENT  6.  Return to clinic in 12 weeks or earlier if problem

## 2018-06-29 ENCOUNTER — Ambulatory Visit: Payer: Medicare Other | Admitting: Allergy and Immunology

## 2018-07-03 ENCOUNTER — Ambulatory Visit: Payer: Medicare HMO | Admitting: Psychology

## 2018-07-03 ENCOUNTER — Encounter: Payer: Self-pay | Admitting: Allergy and Immunology

## 2018-07-06 ENCOUNTER — Telehealth: Payer: Self-pay | Admitting: *Deleted

## 2018-07-06 MED ORDER — FLUTICASONE PROPIONATE HFA 110 MCG/ACT IN AERO: 2.0000 | INHALATION_SPRAY | Freq: Two times a day (BID) | RESPIRATORY_TRACT | 5 refills | Status: DC

## 2018-07-06 NOTE — Telephone Encounter (Signed)
Asmanex HFA 200 is not preferred by the pharmacy they prefer Flovent HFA or Arnuity please advise

## 2018-07-06 NOTE — Telephone Encounter (Signed)
Flovent sent to [facility]

## 2018-07-06 NOTE — Telephone Encounter (Signed)
Switch to Flovent 110

## 2018-07-17 ENCOUNTER — Encounter: Payer: Self-pay | Admitting: Internal Medicine

## 2018-07-26 ENCOUNTER — Ambulatory Visit: Payer: Medicare Other | Admitting: Psychology

## 2018-07-28 ENCOUNTER — Telehealth: Payer: Self-pay | Admitting: *Deleted

## 2018-07-28 MED ORDER — POLYSACCHARIDE IRON COMPLEX 150 MG PO CAPS: 150.0000 mg | ORAL_CAPSULE | Freq: Two times a day (BID) | ORAL | 3 refills | Status: DC

## 2018-07-28 NOTE — Telephone Encounter (Signed)
Called patient in response to VM she left nurse. Patient LVM stating that she needs a  prescription for Ferrex, she has no refills. She also stated that she was feeling tired and felt as though she had no energy and wondered if she needed to have labs. Nurse LVM stating she would forward this information to PCP and nurse did advise patient to make a provider appointment if she thought that her energy level and the way she was feeling warranted for her to be evaluated for follow-up by provider and have lab work.

## 2018-07-28 NOTE — Telephone Encounter (Signed)
RX sent, pt will need OV to address

## 2018-07-29 MED ORDER — POLYSACCHARIDE IRON COMPLEX 150 MG PO CAPS: 150.0000 mg | ORAL_CAPSULE | Freq: Two times a day (BID) | ORAL | 3 refills | Status: DC

## 2018-07-29 NOTE — Telephone Encounter (Signed)
Prescription was renewed Please schedule an office visit I will order lab work then.  Thank you

## 2018-07-29 NOTE — Addendum Note (Signed)
Addended by: Cassandria Anger on: 07/29/2018 09:35 AM   Modules accepted: Orders

## 2018-07-31 ENCOUNTER — Telehealth: Payer: Self-pay | Admitting: *Deleted

## 2018-07-31 NOTE — Telephone Encounter (Signed)
Noted, thank you

## 2018-07-31 NOTE — Telephone Encounter (Signed)
Called patient to schedule an appointment for follow-up regarding blood work and feeling tired. She was informed that refill for Ferrex was sent to her pharmacy. Patient stated that she was staying home for the next couple of weeks due to concerns about coronavirus. An appointment was made for 08/17/18.

## 2018-08-09 ENCOUNTER — Ambulatory Visit (INDEPENDENT_AMBULATORY_CARE_PROVIDER_SITE_OTHER): Payer: Medicare Other | Admitting: Psychology

## 2018-08-09 ENCOUNTER — Ambulatory Visit: Payer: Medicare Other | Admitting: Psychology

## 2018-08-09 DIAGNOSIS — F4321 Adjustment disorder with depressed mood: Secondary | ICD-10-CM | POA: Diagnosis not present

## 2018-08-14 ENCOUNTER — Ambulatory Visit (INDEPENDENT_AMBULATORY_CARE_PROVIDER_SITE_OTHER): Payer: Medicare Other | Admitting: Psychology

## 2018-08-14 DIAGNOSIS — F4321 Adjustment disorder with depressed mood: Secondary | ICD-10-CM | POA: Diagnosis not present

## 2018-08-16 ENCOUNTER — Other Ambulatory Visit: Payer: Self-pay | Admitting: Cardiology

## 2018-08-17 ENCOUNTER — Ambulatory Visit: Payer: Self-pay | Admitting: Internal Medicine

## 2018-08-18 ENCOUNTER — Telehealth: Payer: Self-pay

## 2018-08-18 NOTE — Telephone Encounter (Signed)
Reviewed previous notes and it appears that it was recommended that she stop the antihistamines due to issues with dry eyes.  I am not quite sure why she was advised not to take Singulair.  Does the patient know why her Singulair was stopped?  I would be okay with her resuming use of Singulair for added allergy symptom control.  In regards to the nasal sprays she does not need to use both Nasacort and triamcinolone as they both are nasal steroids,  Would have her pick 1 of those to continue use.    How is she using the nasal ipratropium?  She can use this 3-4 times a day if she needs to help with drainage.

## 2018-08-18 NOTE — Telephone Encounter (Signed)
Patient called in stating she is having severe nasal drainage although she is using 4 nasal sprays. She is using nasacort, triamcinolone, ipratropium, and saline.   She goes outside to walk daily and believes its just severe allergies. She states that she used to be on montelukast but was told to stop. She was also told by Dr. Neldon Mc to stop using all antihistamines.   She is wondering if she can start back on montelukast or take antihistamines to help? Could you please advise since Dr. Neldon Mc is not in office?

## 2018-08-21 ENCOUNTER — Encounter: Payer: Self-pay | Admitting: Internal Medicine

## 2018-08-21 ENCOUNTER — Ambulatory Visit (INDEPENDENT_AMBULATORY_CARE_PROVIDER_SITE_OTHER): Payer: Medicare Other | Admitting: Internal Medicine

## 2018-08-21 DIAGNOSIS — K219 Gastro-esophageal reflux disease without esophagitis: Secondary | ICD-10-CM | POA: Diagnosis not present

## 2018-08-21 DIAGNOSIS — G43019 Migraine without aura, intractable, without status migrainosus: Secondary | ICD-10-CM

## 2018-08-21 DIAGNOSIS — F419 Anxiety disorder, unspecified: Secondary | ICD-10-CM

## 2018-08-21 DIAGNOSIS — E039 Hypothyroidism, unspecified: Secondary | ICD-10-CM

## 2018-08-21 DIAGNOSIS — F4322 Adjustment disorder with anxiety: Secondary | ICD-10-CM

## 2018-08-21 DIAGNOSIS — I10 Essential (primary) hypertension: Secondary | ICD-10-CM

## 2018-08-21 MED ORDER — MONTELUKAST SODIUM 10 MG PO TABS: ORAL_TABLET | ORAL | 5 refills | Status: DC

## 2018-08-21 MED ORDER — LEVOTHYROXINE SODIUM 100 MCG PO TABS: 100.0000 ug | ORAL_TABLET | Freq: Every day | ORAL | 2 refills | Status: DC

## 2018-08-21 MED ORDER — ALPRAZOLAM 0.5 MG PO TABS: 0.5000 mg | ORAL_TABLET | Freq: Two times a day (BID) | ORAL | 2 refills | Status: DC | PRN

## 2018-08-21 MED ORDER — SUMATRIPTAN SUCCINATE 100 MG PO TABS: 50.0000 mg | ORAL_TABLET | ORAL | 5 refills | Status: DC | PRN

## 2018-08-21 MED ORDER — POLYSACCHARIDE IRON COMPLEX 150 MG PO CAPS: 150.0000 mg | ORAL_CAPSULE | Freq: Two times a day (BID) | ORAL | 3 refills | Status: DC

## 2018-08-21 NOTE — Assessment & Plan Note (Signed)
Xanax as needed  Potential benefits of a long term benzodiazepines  use as well as potential risks  and complications were explained to the patient and were aknowledged. Prozac and Remeron per Dr. Casimiro Needle

## 2018-08-21 NOTE — Telephone Encounter (Signed)
Please inform patient that she can retry montelukast 10 mg daily.

## 2018-08-21 NOTE — Assessment & Plan Note (Signed)
Oral Imitrex was renewed

## 2018-08-21 NOTE — Telephone Encounter (Signed)
Patient notified and new prescription was sent to requested pharmacy.

## 2018-08-21 NOTE — Assessment & Plan Note (Signed)
On Nexium and Pepcid

## 2018-08-21 NOTE — Assessment & Plan Note (Signed)
Maxide, Micardis, Bystolic

## 2018-08-21 NOTE — Telephone Encounter (Signed)
Dr. Neldon Mc, would you suggest patient start back on montelukast? Please advise.

## 2018-08-21 NOTE — Addendum Note (Signed)
Addended by: Farrel Demark R on: 08/21/2018 10:12 AM   Modules accepted: Orders

## 2018-08-21 NOTE — Assessment & Plan Note (Signed)
On Levothroid 

## 2018-08-21 NOTE — Progress Notes (Signed)
Virtual Visit via Telephone Note  I connected with [name] on 08/21/18 at  2:00 PM EDT by telephone and verified that I am speaking with the correct person using two identifiers.   I discussed the limitations, risks, security and privacy concerns of performing an evaluation and management service by telephone and the availability of in person appointments. I also discussed with the patient that there may be a patient responsible charge related to this service. The patient expressed understanding and agreed to proceed.   History of Present Illness: Follow-up anxiety, headaches, hypothyroidism [name] had a pneumonia couple months ago.  Her allergies were worse.  She started to see Dr. Neldon Mc.  She is feeling better now   Observations/Objective:  [name] looks well Assessment and Plan:  See plan Follow Up Instructions:    I discussed the assessment and treatment plan with the patient. The patient was provided an opportunity to ask questions and all were answered. The patient agreed with the plan and demonstrated an understanding of the instructions.   The patient was advised to call back or seek an in-person evaluation if the symptoms worsen or if the condition fails to improve as anticipated.  I provided 20 minutes of non-face-to-face time during this encounter.   Walker Kehr, MD

## 2018-08-23 ENCOUNTER — Ambulatory Visit (INDEPENDENT_AMBULATORY_CARE_PROVIDER_SITE_OTHER): Payer: Medicare Other | Admitting: Psychology

## 2018-08-23 DIAGNOSIS — F4321 Adjustment disorder with depressed mood: Secondary | ICD-10-CM | POA: Diagnosis not present

## 2018-08-25 ENCOUNTER — Other Ambulatory Visit (HOSPITAL_COMMUNITY): Payer: Self-pay | Admitting: Psychiatry

## 2018-08-26 ENCOUNTER — Other Ambulatory Visit (HOSPITAL_COMMUNITY): Payer: Self-pay | Admitting: Psychiatry

## 2018-08-28 ENCOUNTER — Other Ambulatory Visit: Payer: Self-pay

## 2018-08-30 MED ORDER — TRIAMTERENE-HCTZ 37.5-25 MG PO TABS: 1.0000 | ORAL_TABLET | Freq: Every day | ORAL | 11 refills | Status: DC

## 2018-09-01 ENCOUNTER — Ambulatory Visit (INDEPENDENT_AMBULATORY_CARE_PROVIDER_SITE_OTHER): Payer: Medicare Other | Admitting: Psychology

## 2018-09-01 DIAGNOSIS — F4321 Adjustment disorder with depressed mood: Secondary | ICD-10-CM | POA: Diagnosis not present

## 2018-09-08 ENCOUNTER — Ambulatory Visit (INDEPENDENT_AMBULATORY_CARE_PROVIDER_SITE_OTHER): Payer: Medicare Other | Admitting: Psychology

## 2018-09-08 DIAGNOSIS — F4321 Adjustment disorder with depressed mood: Secondary | ICD-10-CM | POA: Diagnosis not present

## 2018-09-15 ENCOUNTER — Ambulatory Visit (INDEPENDENT_AMBULATORY_CARE_PROVIDER_SITE_OTHER): Payer: Medicare Other | Admitting: Psychology

## 2018-09-15 DIAGNOSIS — F4321 Adjustment disorder with depressed mood: Secondary | ICD-10-CM | POA: Diagnosis not present

## 2018-09-25 ENCOUNTER — Ambulatory Visit (INDEPENDENT_AMBULATORY_CARE_PROVIDER_SITE_OTHER): Payer: Medicare Other | Admitting: Psychology

## 2018-09-25 DIAGNOSIS — F4321 Adjustment disorder with depressed mood: Secondary | ICD-10-CM

## 2018-09-27 ENCOUNTER — Ambulatory Visit: Payer: Medicare Other | Admitting: Allergy and Immunology

## 2018-10-02 ENCOUNTER — Ambulatory Visit (INDEPENDENT_AMBULATORY_CARE_PROVIDER_SITE_OTHER): Payer: Medicare Other | Admitting: Psychology

## 2018-10-02 DIAGNOSIS — F4321 Adjustment disorder with depressed mood: Secondary | ICD-10-CM | POA: Diagnosis not present

## 2018-10-03 ENCOUNTER — Emergency Department (HOSPITAL_COMMUNITY): Payer: Medicare Other

## 2018-10-03 ENCOUNTER — Other Ambulatory Visit: Payer: Self-pay

## 2018-10-03 ENCOUNTER — Encounter (HOSPITAL_COMMUNITY): Payer: Self-pay | Admitting: Emergency Medicine

## 2018-10-03 ENCOUNTER — Emergency Department (HOSPITAL_COMMUNITY)
Admission: EM | Admit: 2018-10-03 | Discharge: 2018-10-03 | Disposition: A | Discharge status: Home / Self Care | Payer: Medicare Other | Attending: Emergency Medicine | Admitting: Emergency Medicine

## 2018-10-03 DIAGNOSIS — I959 Hypotension, unspecified: Secondary | ICD-10-CM | POA: Diagnosis not present

## 2018-10-03 DIAGNOSIS — E039 Hypothyroidism, unspecified: Secondary | ICD-10-CM | POA: Insufficient documentation

## 2018-10-03 DIAGNOSIS — N3000 Acute cystitis without hematuria: Secondary | ICD-10-CM

## 2018-10-03 DIAGNOSIS — Z79899 Other long term (current) drug therapy: Secondary | ICD-10-CM | POA: Diagnosis not present

## 2018-10-03 DIAGNOSIS — Z1159 Encounter for screening for other viral diseases: Secondary | ICD-10-CM | POA: Diagnosis not present

## 2018-10-03 DIAGNOSIS — R55 Syncope and collapse: Secondary | ICD-10-CM | POA: Diagnosis not present

## 2018-10-03 DIAGNOSIS — R5383 Other fatigue: Secondary | ICD-10-CM | POA: Insufficient documentation

## 2018-10-03 DIAGNOSIS — N183 Chronic kidney disease, stage 3 (moderate): Secondary | ICD-10-CM | POA: Diagnosis not present

## 2018-10-03 DIAGNOSIS — R4781 Slurred speech: Secondary | ICD-10-CM | POA: Insufficient documentation

## 2018-10-03 DIAGNOSIS — R42 Dizziness and giddiness: Secondary | ICD-10-CM | POA: Diagnosis not present

## 2018-10-03 DIAGNOSIS — I129 Hypertensive chronic kidney disease with stage 1 through stage 4 chronic kidney disease, or unspecified chronic kidney disease: Secondary | ICD-10-CM | POA: Insufficient documentation

## 2018-10-03 DIAGNOSIS — I10 Essential (primary) hypertension: Secondary | ICD-10-CM | POA: Diagnosis not present

## 2018-10-03 DIAGNOSIS — Z7982 Long term (current) use of aspirin: Secondary | ICD-10-CM | POA: Insufficient documentation

## 2018-10-03 DIAGNOSIS — R112 Nausea with vomiting, unspecified: Secondary | ICD-10-CM | POA: Diagnosis not present

## 2018-10-03 LAB — BASIC METABOLIC PANEL
Anion gap: 9 (ref 5–15)
BUN: 14 mg/dL (ref 8–23)
CO2: 23 mmol/L (ref 22–32)
Calcium: 9.8 mg/dL (ref 8.9–10.3)
Chloride: 105 mmol/L (ref 98–111)
Creatinine, Ser: 1.32 mg/dL — ABNORMAL HIGH (ref 0.44–1.00)
GFR calc Af Amer: 48 mL/min — ABNORMAL LOW (ref >60)
GFR calc non Af Amer: 41 mL/min — ABNORMAL LOW (ref >60)
Glucose, Bld: 113 mg/dL — ABNORMAL HIGH (ref 70–99)
Potassium: 4.1 mmol/L (ref 3.5–5.1)
Sodium: 137 mmol/L (ref 135–145)

## 2018-10-03 LAB — CBC WITH DIFFERENTIAL/PLATELET
Abs Immature Granulocytes: 0.02 10*3/uL (ref 0.00–0.07)
Basophils Absolute: 0 10*3/uL (ref 0.0–0.1)
Basophils Relative: 1 %
Eosinophils Absolute: 0.2 10*3/uL (ref 0.0–0.5)
Eosinophils Relative: 3 %
HCT: 34.8 % — ABNORMAL LOW (ref 36.0–46.0)
Hemoglobin: 11.3 g/dL — ABNORMAL LOW (ref 12.0–15.0)
Immature Granulocytes: 0 %
Lymphocytes Relative: 17 %
Lymphs Abs: 1.1 10*3/uL (ref 0.7–4.0)
MCH: 30.9 pg (ref 26.0–34.0)
MCHC: 32.5 g/dL (ref 30.0–36.0)
MCV: 95.1 fL (ref 80.0–100.0)
Monocytes Absolute: 0.5 10*3/uL (ref 0.1–1.0)
Monocytes Relative: 8 %
Neutro Abs: 4.5 10*3/uL (ref 1.7–7.7)
Neutrophils Relative %: 71 %
Platelets: 301 10*3/uL (ref 150–400)
RBC: 3.66 MIL/uL — ABNORMAL LOW (ref 3.87–5.11)
RDW: 12.9 % (ref 11.5–15.5)
WBC: 6.3 10*3/uL (ref 4.0–10.5)
nRBC: 0 % (ref 0.0–0.2)

## 2018-10-03 LAB — URINALYSIS, ROUTINE W REFLEX MICROSCOPIC
Bacteria, UA: NONE SEEN
Bilirubin Urine: NEGATIVE
Glucose, UA: NEGATIVE mg/dL
Hgb urine dipstick: NEGATIVE
Ketones, ur: NEGATIVE mg/dL
Nitrite: POSITIVE — AB
Protein, ur: NEGATIVE mg/dL
Specific Gravity, Urine: 1.011 (ref 1.005–1.030)
pH: 6 (ref 5.0–8.0)

## 2018-10-03 LAB — TROPONIN I: Troponin I: <0.03 ng/mL (ref <0.03)

## 2018-10-03 LAB — SARS CORONAVIRUS 2 BY RT PCR (HOSPITAL ORDER, PERFORMED IN ~~LOC~~ HOSPITAL LAB): SARS Coronavirus 2: NEGATIVE

## 2018-10-03 MED ORDER — SODIUM CHLORIDE 0.9 % IV SOLN
1.0000 g | Freq: Once | INTRAVENOUS | Status: AC
  Administered 2018-10-03: 1 g via INTRAVENOUS
  Filled 2018-10-03: qty 10

## 2018-10-03 MED ORDER — SODIUM CHLORIDE 0.9 % IV BOLUS
500.0000 mL | Freq: Once | INTRAVENOUS | Status: AC
  Administered 2018-10-03: 500 mL via INTRAVENOUS

## 2018-10-03 MED ORDER — SULFAMETHOXAZOLE-TRIMETHOPRIM 800-160 MG PO TABS: 1.0000 | ORAL_TABLET | Freq: Two times a day (BID) | ORAL | 0 refills | Status: AC

## 2018-10-03 NOTE — ED Provider Notes (Signed)
Anticipated D/C when UA results. Antibiotics if UA positive. Physical Exam  BP (!) 159/82   Pulse 64   Temp 97.8 F (36.6 C) (Oral)   Resp 17   Ht 5\' 2"  (1.575 m)   Wt 63.5 kg   SpO2 97%   BMI 25.61 kg/m   Physical Exam Patient is alert and appropriate.  No distress.  Vital signs have been stable.   ED Course/Procedures   Clinical Course as of Oct 02 1520  Tue May 19, 328  [id number] 68 year old female here after experiencing some lightheadedness near syncope and possibly vertigo while grocery shopping.  She is awake and alert and has an unremarkable neuro exam.  Differential diagnosis includes presyncope/hypotension, cardiac arrhythmia, vertigo, stroke, metabolic derangement.    [MB]    Clinical Course User Index [MB] Hayden Rasmussen, MD    Procedures  MDM  Have reviewed the patient's results with her.  I reviewed her history of present illness which does sound very strongly of an episode of orthostatic hypotension/near syncope.  Urinalysis is positive.  Patient has had a similar episode with UTI in the past.  She is discharged in good condition.  She is given a dose of Rocephin in the emergency department.  Return precautions are reviewed.  Patient will close follow-up with PCP.      Charlesetta Shanks, MD 10/03/18 [phone]

## 2018-10-03 NOTE — ED Provider Notes (Signed)
[facility] EMERGENCY DEPARTMENT Provider Note   CSN: [id number] Arrival date & time: 10/03/18  1206    History   Chief Complaint No chief complaint on file. CC presyncope  HPI [name] is a 68 y.o. female.  She is brought in by EMS after feeling acutely lightheaded and vertiginous while she was grocery shopping today.  She said she is felt generally fatigued over the past few days and has been sleeping more.  She felt very weak while she was shopping and then became lightheaded and needed to sit down.  She was nauseous and vomited once.  She tried to stand up and then the room was spinning and so EMS was called.  She also noticed that her speech is been a little more slurred or slow over the past few days.  No headache no fever no cough.  No diarrhea or urinary symptoms.  She said she had 1 prior episode of this in the past and was found to have a UTI.  No recent medication changes.  No sick contacts or recent travel.     The history is provided by the patient.  Near Syncope  This is a new problem. The current episode started less than 1 hour ago. The problem occurs rarely. The problem has been gradually improving. Pertinent negatives include no chest pain, no abdominal pain, no headaches and no shortness of breath. The symptoms are aggravated by standing. The symptoms are relieved by position. She has tried rest for the symptoms. The treatment provided moderate relief.    Past Medical History:  Diagnosis Date  . Abdominal pain, generalized 03/26/2014  . Acute upper respiratory infection 05/05/2016   12/17  . Adaptive colitis 12/05/2013  . Adult hypothyroidism 12/05/2013   On Levothroid  . Allergic rhinitis, cause unspecified   . Anemia 05/31/2017   Iron def  . Anxiety   . Asthma    chronic cough  . Bipolar I disorder, most recent episode (or current) depressed, severe, without mention of psychotic behavior   . Blood transfusion without reported  diagnosis   . 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 Cephalalgia 12/05/2013  . 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 Chest pain, atypical 07/13/2017   2/19 CT abd/chest - ok in June 2018  . Chronic depression 10/13/2016  . Cold intolerance 03/26/2014  . Concussion w/o coma 09/18/2014  . Cough variant asthma  vs UACS/ irritable larynx  03/26/2014   FENO 08/11/2016  =   17 p no rx    . Deep phlebothrombosis, postpartum 02/14/2008   Overview:  Overview:  Annotation: affected mostly left leg. Qualifier: Diagnosis of  By: Linda Hedges MD, Heinz Knuckles   . Deep phlebothrombosis, postpartum, unspecified as to episode of care(671.40)   . Depression   . Feeling bilious 12/05/2013  . Female genuine stress incontinence 04/12/2012  . Fibromyalgia   . Gastritis   . GERD 03/04/2007   Protonix   . GERD (gastroesophageal reflux disease)   . HTN (hypertension) 05/31/2017   1/19 Verapamil bid 2/19 added Maxzide  . Hyperlipidemia   . IBS (irritable bowel syndrome)   . 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 Irritable bowel syndrome with diarrhea 09/12/2014   Dr Olevia Perches  . IRRITABLE BOWEL SYNDROME, HX OF 03/04/2007   Qualifier: Diagnosis of  By: Quentin Cornwall CMA, Janett Billow    . Laryngopharyngeal reflux (LPR) 06/15/2016  . Leg pain, anterior, left 12/15/2016   8/18 2/19 L  . 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 Lung mass 09/10/2013  . Lung nodule, solitary 09/10/2013  . Malaise and fatigue 04/16/2015  . Menopause 12/05/2013  . Migraine headache   . Mild cognitive disorder 12/05/2013  . MUSCLE PAIN 07/03/2008   Qualifier: Diagnosis of  By: Jenny Reichmann MD, Hunt Oris   . Other and unspecified hyperlipidemia   . Parathyroid adenoma 2014  . Paresthesia 12/15/2016   8/18 L lat foot - ?peroneal nerve damage  . Positive skin test 03/26/2014  . Post concussion syndrome 10/28/2014  . Severe episode of recurrent major depressive  disorder, without psychotic features ([place]) 12/18/2014   On Fluoxetine   . Status post chemotherapy 2014  . Syncope 10/13/2016  . Unspecified hypothyroidism 04/25/2013  . Vitamin D deficiency 04/25/2013    Patient Active Problem List   Diagnosis Date Noted  . Dyspnea on exertion 08/25/2017  . Chest pain in adult 07/13/2017  . HTN (hypertension) 05/31/2017  . Anemia 05/31/2017  . Leg pain, anterior, left 12/15/2016  . Paresthesia 12/15/2016  . Syncope 10/13/2016  . Migraine headache 10/13/2016  . Asthma 10/13/2016  . Anxiety 10/13/2016  . Hyperlipidemia 10/13/2016  . UTI (urinary tract infection) 10/13/2016  . CKD (chronic kidney disease) stage 3, GFR 30-59 ml/min (HCC) 10/13/2016  . Chronic depression 10/13/2016  . Hypothyroidism 10/13/2016  . Laryngopharyngeal reflux (LPR) 06/15/2016  . Acute upper respiratory infection 05/05/2016  . Degenerative disc disease, cervical 11/29/2015  . IBS (irritable bowel syndrome) 11/24/2015  . Facet arthritis of cervical region 10/16/2015  . Chronic urinary tract infection 07/22/2015  . Malaise and fatigue 04/16/2015  . Severe episode of recurrent major depressive disorder, without psychotic features ([place]) 12/18/2014  . Post concussion syndrome 10/28/2014  . 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 Depression 10/25/2014  . Major depressive disorder, recurrent, severe without psychotic features ([place])   . 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 Concussion w/o coma 09/18/2014  . Irritable bowel syndrome with diarrhea 09/12/2014  . 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 Cold intolerance 03/26/2014  . Cough variant asthma  vs UACS/ irritable larynx  03/26/2014  . Abdominal pain, generalized 03/26/2014  . Positive skin test 03/26/2014  . Adjustment disorder with anxiety 02/07/2014  . Cervical pain 12/05/2013  . Fibromyalgia 12/05/2013  . Cephalalgia 12/05/2013  . Adult hypothyroidism  12/05/2013  . Adaptive colitis 12/05/2013  . Menopause 12/05/2013  . Mild cognitive disorder 12/05/2013  . Feeling bilious 12/05/2013  . Leiomyosarcoma ([place]) 09/10/2013  . Lung mass 09/10/2013  . Lung nodule, solitary 09/10/2013  . Abnormal chest x-ray   . Unspecified hypothyroidism 04/25/2013  . Vitamin D deficiency 04/25/2013  . 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 Female genuine stress incontinence 04/12/2012  . Healthcare maintenance 10/30/2010  . MUSCLE PAIN 07/03/2008  . BIPLR I MOST RECENT EPIS DPRS SEV NO PSYCHOT [facility] 02/14/2008  . DEEP PHLEBOTHROMBOSIS PP UNSPEC AS EPIS CARE 02/14/2008  . Bipolar affective disorder, current episode depressed ([place]) 02/14/2008  . Deep phlebothrombosis, postpartum 02/14/2008  . HYPERLIPIDEMIA 03/04/2007  . ALLERGIC RHINITIS 03/04/2007  . GERD 03/04/2007  . IRRITABLE BOWEL SYNDROME, HX OF 03/04/2007    Past Surgical History:  Procedure Laterality Date  . ANTERIOR FUSION CERVICAL SPINE  2006  . BLADDER SURGERY  06/2016   [facility], piece of mesh with necrotic tissue removed  . CATARACT  EXTRACTION, BILATERAL  2019  . CESAREAN SECTION  1986  . CHOLECYSTECTOMY N/A 05/03/2013   Procedure: LAPAROSCOPIC CHOLECYSTECTOMY WITH INTRAOPERATIVE CHOLANGIOGRAM;  Surgeon: Odis Hollingshead, MD;  Location: [facility];  Service: General;  Laterality: N/A;  . COLONOSCOPY    . herniated discectomy  2019  . laprotomy  04/2012   for cancer  . NECK SURGERY  2006   C spine fusion  . POSTERIOR FUSION CERVICAL SPINE  2016  . PUBOVAGINAL SLING    . RIGHT/LEFT HEART CATH AND CORONARY ANGIOGRAPHY N/A 08/26/2017   Procedure: RIGHT/LEFT HEART CATH AND CORONARY ANGIOGRAPHY;  Surgeon: Burnell Blanks, MD;  Location: [facility];  Service: Cardiovascular;  Laterality: N/A;  . VAGINAL HYSTERECTOMY       OB History   No obstetric history on file.      Home Medications    Prior to Admission medications   Medication Sig Start Date End Date  Taking? Authorizing Provider  albuterol (PROAIR HFA) 108 (90 Base) MCG/ACT inhaler Inhale 2 puffs every 4-6 hours as needed for cough, wheeze, shortness of breath or chest tightness 06/08/18   Kozlow, Donnamarie Poag, MD  ALPRAZolam Duanne Moron) 0.5 MG tablet Take 1 tablet (0.5 mg total) by mouth 2 (two) times daily as needed for anxiety. 08/21/18   Plotnikov, [name], MD  ASMANEX HFA 200 MCG/ACT AERO Inhale 2 puffs into the lungs daily. With spacer 06/08/18   Kozlow, Donnamarie Poag, MD  aspirin EC 81 MG tablet Take 81 mg by mouth daily.    [provider]  benzonatate (TESSALON) 100 MG capsule Take 1 capsule (100 mg total) by mouth 3 (three) times daily. 06/16/18   Fenton Foy, NP  busPIRone (BUSPAR) 10 MG tablet Take 2 tablets (20 mg total) by mouth 2 (two) times daily. 06/03/18   Plotnikov, [name], MD  Calcium Carb-Cholecalciferol (CALCIUM + D3) 600-800 MG-UNIT TABS Take 1 tablet by mouth daily.    [provider]  candesartan (ATACAND) 16 MG tablet TAKE 1 TABLET EVERY DAY -THIS REPLACES TELMISARTAN- 02/22/18   Richardo Priest, MD  carvedilol (COREG) 12.5 MG tablet Take 1 tablet (12.5 mg total) by mouth 2 (two) times daily. 05/24/18 08/22/18  Richardo Priest, MD  Cholecalciferol (D3-1000) 1000 units capsule Take 1,000 Units by mouth daily.    [provider]  Cyanocobalamin (VITAMIN B-12) 1000 MCG SUBL Place 1 tablet (1,000 mcg total) under the tongue daily. Patient taking differently: Place 2,000 mcg under the tongue daily.  05/05/16   Plotnikov, [name], MD  cyclobenzaprine (FLEXERIL) 10 MG tablet Take 10 mg by mouth 2 (two) times daily as needed for muscle spasms.     [provider]  D-Mannose 500 MG CAPS Take 500 mg by mouth daily. WITH CRANBERRY & DANDELION EXTRACT    [provider]  esomeprazole (NEXIUM) 40 MG capsule Take 1 capsule by mouth daily in the morning 06/08/18   Kozlow, Donnamarie Poag, MD  estradiol (ESTRACE) 1 MG tablet Take 1 tablet (1 mg total) by mouth daily.  10/28/14   Niel Hummer, NP  famotidine (PEPCID) 40 MG tablet Take 1 tablet by mouth daily in the evening 06/08/18   Kozlow, Donnamarie Poag, MD  FLUoxetine (PROZAC) 40 MG capsule Take 1 capsule (40 mg total) by mouth every morning. 06/03/18   Plotnikov, [name], MD  fluticasone (FLOVENT HFA) 110 MCG/ACT inhaler Inhale 2 puffs into the lungs 2 (two) times daily. 07/06/18   Kozlow, Donnamarie Poag,  MD  hydrochlorothiazide (MICROZIDE) 12.5 MG capsule TAKE 1 CAPSULE BY MOUTH EVERY DAY 08/16/18   Richardo Priest, MD  ipratropium (ATROVENT) 0.06 % nasal spray Use 1-2 sprays in each nostril every 6 hours as needed 06/08/18   Kozlow, Donnamarie Poag, MD  iron polysaccharides (FERREX 150) 150 MG capsule Take 1 capsule (150 mg total) by mouth 2 (two) times daily. 08/21/18   Plotnikov, [name], MD  Lactobacillus (CVS PROBIOTIC ACIDOPHILUS PO) Take 1 capsule by mouth daily.    [provider]  levothyroxine (SYNTHROID, LEVOTHROID) 100 MCG tablet Take 1 tablet (100 mcg total) by mouth daily before breakfast. 08/21/18   Plotnikov, [name], MD  meclizine (ANTIVERT) 25 MG tablet Take 1 tablet (25 mg total) by mouth 3 (three) times daily as needed for dizziness. 10/11/17   Lorin Glass, PA-C  mirtazapine (REMERON) 30 MG tablet Take 1 tablet (30 mg total) by mouth at bedtime. 06/03/18   Plotnikov, [name], MD  montelukast (SINGULAIR) 10 MG tablet Take 1 tablet by mouth once daily 08/21/18   Kozlow, Donnamarie Poag, MD  Multiple Vitamins-Minerals (MULTIVITAMIN WITH MINERALS) tablet Take 1 tablet by mouth daily. 10/28/14   Niel Hummer, NP  nebivolol (BYSTOLIC) 5 MG tablet Take 1 tablet (5 mg total) by mouth daily. 02/16/18   Richardo Priest, MD  ondansetron (ZOFRAN-ODT) 4 MG disintegrating tablet Take 4 mg by mouth every 8 (eight) hours as needed for nausea or vomiting.  07/14/16   [provider]  Polyethyl Glyc-Propyl Glyc PF (SYSTANE PRESERVATIVE FREE) 0.4-0.3 % SOLN Apply 2 drops to eye 3 (three) times daily as needed (for  irritation).    [provider]  progesterone (PROMETRIUM) 100 MG capsule Take 100 mg by mouth at bedtime.    [provider]  SUMAtriptan (IMITREX) 100 MG tablet Take 0.5-1 tablets (50-100 mg total) by mouth every 2 (two) hours as needed for migraine. May repeat in 2 hours if headache persists or recurs. 08/21/18   Plotnikov, [name], MD  SUMAtriptan (IMITREX) 6 MG/0.5ML SOLN injection Inject 6 mg into the skin every 2 (two) hours as needed for migraine or headache. Reported on 08/11/2015    [provider]  traMADol (ULTRAM) 50 MG tablet Take 50 mg by mouth every 6 (six) hours as needed (for pain).     [provider]  triamcinolone (NASACORT ALLERGY 24HR) 55 MCG/ACT AERO nasal inhaler Use 1 spray in each nostril once daily 06/08/18   Kozlow, Donnamarie Poag, MD  triamterene-hydrochlorothiazide (MAXZIDE-25) 37.5-25 MG tablet Take 1 tablet by mouth daily. 08/30/18 08/30/19  Plotnikov, [name], MD    Family History Family History  Problem Relation Age of Onset  . Coronary artery disease Father   . Heart disease Father   . Hypertension Father   . Irritable bowel syndrome Father   . Arthritis Mother   . Hypertension Mother   . Migraines Mother   . Heart disease Mother   . Diabetes Maternal Grandmother   . Allergies Maternal Grandmother   . Irritable bowel syndrome Sister   . Irritable bowel syndrome Brother   . Colon cancer Neg Hx   . Esophageal cancer Neg Hx   . Rectal cancer Neg Hx   . Stomach cancer Neg Hx     Social History Social History   Tobacco Use  . Smoking status: Never Smoker  . Smokeless tobacco: Never Used  Substance Use Topics  . Alcohol use: Yes    Alcohol/week: 2.0 -  3.0 standard drinks    Types: 2 - 3 Standard drinks or equivalent per week    Comment: rarely  . Drug use: No     Allergies   Buprenorphine hcl; Morphine and related; Keflex [cephalexin]; Other; Codeine; Dust mite extract; and Molds & smuts   Review of Systems  Review of Systems  Constitutional: Positive for fatigue. Negative for fever.  HENT: Negative for sore throat.   Eyes: Negative for visual disturbance.  Respiratory: Negative for shortness of breath.   Cardiovascular: Positive for near-syncope. Negative for chest pain.  Gastrointestinal: Positive for nausea and vomiting. Negative for abdominal pain and diarrhea.  Genitourinary: Negative for dysuria.  Musculoskeletal: Negative for neck pain.  Skin: Negative for rash.  Neurological: Positive for speech difficulty. Negative for headaches.     Physical Exam Updated Vital Signs BP 117/78 (BP Location: Right Arm)   Pulse 60   Temp 97.8 F (36.6 C) (Oral)   Resp 16   Ht 5\' 2"  (1.575 m)   Wt 63.5 kg   SpO2 97%   BMI 25.61 kg/m   Physical Exam Vitals signs and nursing note reviewed.  Constitutional:      General: She is not in acute distress.    Appearance: She is well-developed.  HENT:     Head: Normocephalic and atraumatic.  Eyes:     Conjunctiva/sclera: Conjunctivae normal.  Neck:     Musculoskeletal: Neck supple.  Cardiovascular:     Rate and Rhythm: Normal rate and regular rhythm.     Heart sounds: No murmur.  Pulmonary:     Effort: Pulmonary effort is normal. No respiratory distress.     Breath sounds: Normal breath sounds.  Abdominal:     Palpations: Abdomen is soft.     Tenderness: There is no abdominal tenderness.  Musculoskeletal: Normal range of motion.     Right lower leg: No edema.     Left lower leg: No edema.  Skin:    General: Skin is warm and dry.     Capillary Refill: Capillary refill takes less than 2 seconds.  Neurological:     General: No focal deficit present.     Mental Status: She is alert and oriented to person, place, and time.     Cranial Nerves: No cranial nerve deficit.     Sensory: No sensory deficit.     Motor: No weakness.      ED Treatments / Results  Labs (all labs ordered are listed, but only abnormal results are displayed)  Labs Reviewed  BASIC METABOLIC PANEL - Abnormal; Notable for the following components:      Result Value   Glucose, Bld 113 (*)    Creatinine, Ser 1.32 (*)    GFR calc non Af Amer 41 (*)    GFR calc Af Amer 48 (*)    All other components within normal limits  CBC WITH DIFFERENTIAL/PLATELET - Abnormal; Notable for the following components:   RBC 3.66 (*)    Hemoglobin 11.3 (*)    HCT 34.8 (*)    All other components within normal limits  URINALYSIS, ROUTINE W REFLEX MICROSCOPIC - Abnormal; Notable for the following components:   APPearance HAZY (*)    Nitrite POSITIVE (*)    Leukocytes,Ua LARGE (*)    All other components within normal limits  SARS CORONAVIRUS 2 (HOSPITAL ORDER, [facility])  URINE CULTURE  TROPONIN I    EKG EKG Interpretation  Date/Time:  Tuesday Oct 03 2018 12:14:27 EDT Ventricular Rate:  65 PR Interval:    QRS Duration: 90 QT Interval:  431 QTC Calculation: 455 R Axis:   45 Text Interpretation:  Sinus rhythm Prolonged PR interval Prominent P waves, nondiagnostic Low voltage, precordial leads Artifact in lead(s) I II aVR aVL aVF poor baseline, likely similar to prior 5/19 Confirmed by Aletta Edouard [phone]) on 10/03/2018 12:35:03 PM Also confirmed by Aletta Edouard [phone]), editor Hattie Perch ([id number])  on 10/03/2018 3:04:36 PM   Radiology Ct Head Wo Contrast  Result Date: 10/03/2018 CLINICAL DATA:  Dizziness with syncope EXAM: CT HEAD WITHOUT CONTRAST TECHNIQUE: Contiguous axial images were obtained from the base of the skull through the vertex without intravenous contrast. COMPARISON:  Oct 13, 2016 FINDINGS: Brain: The ventricles are normal in size and configuration. 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 There is no intracranial mass, hemorrhage, extra-axial fluid collection, or midline shift. Brain parenchyma appears unremarkable. No evident acute infarct. Vascular: No hyperdense vessel. There is  calcification in each carotid siphon region. Skull: The bony calvarium appears intact. Sinuses/Orbits: There is mild mucosal thickening in several ethmoid air cells. Other visualized paranasal sinuses are clear. Orbits appear symmetric bilaterally. Other: Mastoid air cells are clear. IMPRESSION: Frontal atrophy bilaterally, stable. Ventricles normal in size and configuration. Brain parenchyma appears unremarkable. No acute infarct evident. No mass or hemorrhage. There are foci of arterial vascular calcification. There is mild mucosal thickening in several ethmoid air cells. Electronically Signed   By: Lowella Grip III M.D.   On: 10/03/2018 13:30    Procedures Procedures (including critical care time)  Medications Ordered in ED Medications  sodium chloride 0.9 % bolus 500 mL (has no administration in time range)     Initial Impression / Assessment and Plan / ED Course  I have reviewed the triage vital signs and the nursing notes.  Pertinent labs & imaging results that were available during my care of the patient were reviewed by me and considered in my medical decision making (see chart for details).  Clinical Course as of Oct 03 1742  Tue Oct 02, 3920  5526 68 year old female here after experiencing some lightheadedness near syncope and possibly vertigo while grocery shopping.  She is awake and alert and has an unremarkable neuro exam.  Differential diagnosis includes presyncope/hypotension, cardiac arrhythmia, vertigo, stroke, metabolic derangement.    [MB]    Clinical Course User Index [MB] Hayden Rasmussen, MD   [name] Marks was evaluated in Emergency Department on 10/03/2018 for the symptoms described in the history of present illness. She was evaluated in the context of the global COVID-19 pandemic, which necessitated consideration that the patient might be at risk for infection with the SARS-CoV-2 virus that causes COVID-19. Institutional protocols and algorithms that  pertain to the evaluation of patients at risk for COVID-19 are in a state of rapid change based on information released by regulatory bodies including the CDC and federal and state organizations. These policies and algorithms were followed during the patient's care in the ED.    Patient was signed out to Dr Johnney Killian with [facility] pending. Plan to [facility] discharge +/- antibiotics if patient continuies to improve.   Final Clinical Impressions(s) / ED Diagnoses   Final diagnoses:  None    ED Discharge Orders    None       Hayden Rasmussen, MD 10/03/18 1746

## 2018-10-03 NOTE — ED Notes (Signed)
Patient verbalizes understanding of discharge instructions. Opportunity for questioning and answers were provided. Armband removed by staff, pt discharged from ED via wheelchair to home.  

## 2018-10-03 NOTE — ED Notes (Signed)
Pt ambulated self efficiently to restroom with no difficulty. Pt ambulated safely to bedside.

## 2018-10-03 NOTE — ED Triage Notes (Signed)
[facility]- pt from [employer]. Pt felt dizzy and vomited. Pt has a history of vertigo and had a UTI last time this happened. No pain. Negative stroke screen. Pt feels she has slurred speech sometimes.    78/54 HR 58 97% Ra 137 BCG 97.5

## 2018-10-03 NOTE — ED Notes (Signed)
This RN acting as Art therapist and asked pt if she would like for me to reach out to any family/friends. Pt requested that Dr. 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 I spoke with Dr. Velora Heckler and informed him on her care thus far. He was very thankful for the update.

## 2018-10-03 NOTE — Discharge Instructions (Addendum)
1.  You may start your antibiotics tomorrow evening.  You were given a dose of IV Rocephin in the emergency department which is good for 24 hours. 2.  Stay hydrated and rest tomorrow. 3.  Schedule a recheck with your doctor in the next 3 to 5 days.  Return to the emergency department if you have recurrence of lightheadedness, chest pain, shortness of breath, feel he will pass out, back pain, fever or other concerning symptoms.

## 2018-10-05 LAB — URINE CULTURE: Culture: >=100000 — AB

## 2018-10-06 ENCOUNTER — Telehealth: Payer: Self-pay | Admitting: *Deleted

## 2018-10-06 NOTE — Telephone Encounter (Signed)
Post ED Visit - Positive Culture Follow-up  Culture report reviewed by antimicrobial stewardship pharmacist: [facility] Team []  Elenor Quinones, Pharm.D. []  Heide Guile, Pharm.D., BCPS AQ-ID [x]  Parks Neptune, Pharm.D., BCPS []  Alycia Rossetti, Pharm.D., BCPS []  [place], Pharm.D., BCPS, AAHIVP []  Legrand Como, Pharm.D., BCPS, AAHIVP []  Salome Arnt, PharmD, BCPS []  Johnnette Gourd, PharmD, BCPS []  Hughes Better, PharmD, BCPS []  Leeroy Cha, PharmD []  Laqueta Linden, PharmD, BCPS []  Albertina Parr, PharmD  [facility] Team []  Leodis Sias, PharmD []  Lindell Spar, PharmD []  Royetta Asal, PharmD []  Graylin Shiver, Rph []  Rema Fendt) Glennon Mac, PharmD []  Arlyn Dunning, PharmD []  Netta Cedars, PharmD []  Dia Sitter, PharmD []  Leone Haven, PharmD []  Gretta Arab, PharmD []  Theodis Shove, PharmD []  Peggyann Juba, PharmD []  Reuel Boom, PharmD   Positive urine culture Treated with Sulfamethoxazole-Trimethoprim, organism sensitive to the same and no further patient follow-up is required at this time.  Harlon Flor [facility] 10/06/2018, 12:15 PM

## 2018-10-12 DIAGNOSIS — N3020 Other chronic cystitis without hematuria: Secondary | ICD-10-CM | POA: Diagnosis not present

## 2018-10-13 ENCOUNTER — Other Ambulatory Visit: Payer: Self-pay | Admitting: Internal Medicine

## 2018-10-16 ENCOUNTER — Ambulatory Visit (INDEPENDENT_AMBULATORY_CARE_PROVIDER_SITE_OTHER): Payer: Medicare Other | Admitting: Psychology

## 2018-10-16 DIAGNOSIS — F4321 Adjustment disorder with depressed mood: Secondary | ICD-10-CM | POA: Diagnosis not present

## 2018-10-18 ENCOUNTER — Encounter: Payer: Self-pay | Admitting: Internal Medicine

## 2018-10-18 ENCOUNTER — Other Ambulatory Visit (INDEPENDENT_AMBULATORY_CARE_PROVIDER_SITE_OTHER): Payer: Medicare Other

## 2018-10-18 ENCOUNTER — Other Ambulatory Visit: Payer: Self-pay

## 2018-10-18 ENCOUNTER — Ambulatory Visit (INDEPENDENT_AMBULATORY_CARE_PROVIDER_SITE_OTHER): Payer: Medicare Other | Admitting: Internal Medicine

## 2018-10-18 VITALS — BP 120/72 | HR 66 | Temp 98.0°F | Ht 62.0 in | Wt 150.0 lb

## 2018-10-18 DIAGNOSIS — G319 Degenerative disease of nervous system, unspecified: Secondary | ICD-10-CM | POA: Insufficient documentation

## 2018-10-18 DIAGNOSIS — N183 Chronic kidney disease, stage 3 unspecified: Secondary | ICD-10-CM

## 2018-10-18 DIAGNOSIS — I10 Essential (primary) hypertension: Secondary | ICD-10-CM

## 2018-10-18 DIAGNOSIS — G43019 Migraine without aura, intractable, without status migrainosus: Secondary | ICD-10-CM | POA: Diagnosis not present

## 2018-10-18 DIAGNOSIS — E039 Hypothyroidism, unspecified: Secondary | ICD-10-CM

## 2018-10-18 DIAGNOSIS — R55 Syncope and collapse: Secondary | ICD-10-CM

## 2018-10-18 DIAGNOSIS — D509 Iron deficiency anemia, unspecified: Secondary | ICD-10-CM | POA: Diagnosis not present

## 2018-10-18 DIAGNOSIS — F419 Anxiety disorder, unspecified: Secondary | ICD-10-CM | POA: Diagnosis not present

## 2018-10-18 DIAGNOSIS — E559 Vitamin D deficiency, unspecified: Secondary | ICD-10-CM

## 2018-10-18 DIAGNOSIS — F4322 Adjustment disorder with anxiety: Secondary | ICD-10-CM

## 2018-10-18 HISTORY — DX: Degenerative disease of nervous system, unspecified: G31.9

## 2018-10-18 LAB — LIPID PANEL
Cholesterol: 263 mg/dL — ABNORMAL HIGH (ref 0–200)
HDL: 45.4 mg/dL (ref >39.00)
NonHDL: 217.22
Total CHOL/HDL Ratio: 6
Triglycerides: 271 mg/dL — ABNORMAL HIGH (ref 0.0–149.0)
VLDL: 54.2 mg/dL — ABNORMAL HIGH (ref 0.0–40.0)

## 2018-10-18 LAB — CBC WITH DIFFERENTIAL/PLATELET
Basophils Absolute: 0.1 10*3/uL (ref 0.0–0.1)
Basophils Relative: 1.1 % (ref 0.0–3.0)
Eosinophils Absolute: 0.2 10*3/uL (ref 0.0–0.7)
Eosinophils Relative: 2.6 % (ref 0.0–5.0)
HCT: 35.8 % — ABNORMAL LOW (ref 36.0–46.0)
Hemoglobin: 12.5 g/dL (ref 12.0–15.0)
Lymphocytes Relative: 22.1 % (ref 12.0–46.0)
Lymphs Abs: 1.5 10*3/uL (ref 0.7–4.0)
MCHC: 34.7 g/dL (ref 30.0–36.0)
MCV: 90.9 fl (ref 78.0–100.0)
Monocytes Absolute: 0.5 10*3/uL (ref 0.1–1.0)
Monocytes Relative: 7.1 % (ref 3.0–12.0)
Neutro Abs: 4.6 10*3/uL (ref 1.4–7.7)
Neutrophils Relative %: 67.1 % (ref 43.0–77.0)
Platelets: 402 10*3/uL — ABNORMAL HIGH (ref 150.0–400.0)
RBC: 3.94 Mil/uL (ref 3.87–5.11)
RDW: 13.5 % (ref 11.5–15.5)
WBC: 6.8 10*3/uL (ref 4.0–10.5)

## 2018-10-18 LAB — HEPATIC FUNCTION PANEL
ALT: 16 U/L (ref 0–35)
AST: 20 U/L (ref 0–37)
Albumin: 4.1 g/dL (ref 3.5–5.2)
Alkaline Phosphatase: 84 U/L (ref 39–117)
Bilirubin, Direct: 0 mg/dL (ref 0.0–0.3)
Total Bilirubin: 0.4 mg/dL (ref 0.2–1.2)
Total Protein: 7 g/dL (ref 6.0–8.3)

## 2018-10-18 LAB — URINALYSIS, ROUTINE W REFLEX MICROSCOPIC
Bilirubin Urine: NEGATIVE
Hgb urine dipstick: NEGATIVE
Ketones, ur: NEGATIVE
Nitrite: POSITIVE — AB
Specific Gravity, Urine: 1.01 (ref 1.000–1.030)
Total Protein, Urine: NEGATIVE
Urine Glucose: NEGATIVE
Urobilinogen, UA: 0.2 (ref 0.0–1.0)
pH: 6.5 (ref 5.0–8.0)

## 2018-10-18 LAB — BASIC METABOLIC PANEL
BUN: 24 mg/dL — ABNORMAL HIGH (ref 6–23)
CO2: 27 mEq/L (ref 19–32)
Calcium: 10.3 mg/dL (ref 8.4–10.5)
Chloride: 98 mEq/L (ref 96–112)
Creatinine, Ser: 1.29 mg/dL — ABNORMAL HIGH (ref 0.40–1.20)
GFR: 41.01 mL/min — ABNORMAL LOW (ref >60.00)
Glucose, Bld: 86 mg/dL (ref 70–99)
Potassium: 4.5 mEq/L (ref 3.5–5.1)
Sodium: 131 mEq/L — ABNORMAL LOW (ref 135–145)

## 2018-10-18 LAB — TSH: TSH: 0.32 u[IU]/mL — ABNORMAL LOW (ref 0.35–4.50)

## 2018-10-18 LAB — LDL CHOLESTEROL, DIRECT: Direct LDL: 160 mg/dL

## 2018-10-18 MED ORDER — CANDESARTAN CILEXETIL 16 MG PO TABS: 8.0000 mg | ORAL_TABLET | Freq: Every day | ORAL | 1 refills | Status: DC

## 2018-10-18 NOTE — Assessment & Plan Note (Signed)
Reduce Atacand dose

## 2018-10-18 NOTE — Assessment & Plan Note (Signed)
Ref to Dr Posey Pronto

## 2018-10-18 NOTE — Assessment & Plan Note (Signed)
Maxzide, Micardis, Bystolic

## 2018-10-18 NOTE — Assessment & Plan Note (Signed)
Vit D 

## 2018-10-18 NOTE — Progress Notes (Signed)
Subjective:  Patient ID: [name], female    DOB: 11-24-1950  Age: 68 y.o. MRN: [id number]  CC: No chief complaint on file.   HPI Paylin Hailu Masters Luetta Nutting presents for near-syncope at [employer] [facility] [employer] at 10 am and a UTI on 5/19 - s/p ER visit. Per Dr Johnney Killian note: " I reviewed her history of present illness which does sound very strongly of an episode of orthostatic hypotension/near syncope.  Urinalysis is positive.  Patient has had a similar episode with UTI in the past.  She is discharged in good condition.  She is given a dose of Rocephin in the emergency department.  Return precautions are reviewed." Dr Louis Meckel asked to use more probiotics F/u abn head CT  Outpatient Medications Prior to Visit  Medication Sig Dispense Refill  . albuterol (PROAIR HFA) 108 (90 Base) MCG/ACT inhaler Inhale 2 puffs every 4-6 hours as needed for cough, wheeze, shortness of breath or chest tightness 18 g 1  . ALPRAZolam (XANAX) 0.5 MG tablet Take 1 tablet (0.5 mg total) by mouth 2 (two) times daily as needed for anxiety. 60 tablet 2  . ASMANEX HFA 200 MCG/ACT AERO Inhale 2 puffs into the lungs daily. With spacer 13 g 5  . aspirin EC 81 MG tablet Take 81 mg by mouth daily.    . busPIRone (BUSPAR) 10 MG tablet Take 2 tablets (20 mg total) by mouth 2 (two) times daily. (Patient taking differently: Take 30 mg by mouth 2 (two) times daily. ) 360 tablet 1  . Calcium Carb-Cholecalciferol (CALCIUM + D3) 600-800 MG-UNIT TABS Take 1 tablet by mouth daily.    . candesartan (ATACAND) 16 MG tablet TAKE 1 TABLET EVERY DAY -THIS REPLACES TELMISARTAN- (Patient taking differently: Take 16 mg by mouth daily. T) 90 tablet 1  . Cholecalciferol (D3-1000) 1000 units capsule Take 1,000 Units by mouth daily.    . Cyanocobalamin (VITAMIN B-12) 1000 MCG SUBL Place 1 tablet (1,000 mcg total) under the tongue daily. (Patient taking differently: Place 2,000 mcg under the tongue daily. ) 100 tablet 3  . cyclobenzaprine (FLEXERIL) 10  MG tablet Take 10 mg by mouth 2 (two) times daily as needed for muscle spasms.     . D-Mannose 500 MG CAPS Take 500 mg by mouth daily. WITH CRANBERRY & DANDELION EXTRACT    . esomeprazole (NEXIUM) 40 MG capsule Take 1 capsule by mouth daily in the morning 30 capsule 5  . estradiol (ESTRACE) 1 MG tablet Take 1 tablet (1 mg total) by mouth daily. 90 tablet 3  . famotidine (PEPCID) 40 MG tablet Take 1 tablet by mouth daily in the evening 30 tablet 5  . FLUoxetine (PROZAC) 40 MG capsule Take 1 capsule (40 mg total) by mouth every morning. 90 capsule 1  . fluticasone (FLOVENT HFA) 110 MCG/ACT inhaler Inhale 2 puffs into the lungs 2 (two) times daily. (Patient taking differently: Inhale 2 puffs into the lungs as needed (shortness of breath). ) 1 Inhaler 5  . hydrochlorothiazide (MICROZIDE) 12.5 MG capsule TAKE 1 CAPSULE BY MOUTH EVERY DAY (Patient taking differently: Take 12.5 mg by mouth daily. ) 90 capsule 1  . ipratropium (ATROVENT) 0.06 % nasal spray Use 1-2 sprays in each nostril every 6 hours as needed (Patient taking differently: Place into the nose daily. ) 15 mL 5  . iron polysaccharides (FERREX 150) 150 MG capsule Take 1 capsule (150 mg total) by mouth 2 (two) times daily. 180 capsule 3  . Lactobacillus ([facility]  PROBIOTIC ACIDOPHILUS PO) Take 1 capsule by mouth daily.    Marland Kitchen levothyroxine (SYNTHROID, LEVOTHROID) 100 MCG tablet Take 1 tablet (100 mcg total) by mouth daily before breakfast. 90 tablet 2  . meclizine (ANTIVERT) 25 MG tablet Take 1 tablet (25 mg total) by mouth 3 (three) times daily as needed for dizziness. 30 tablet 0  . mirtazapine (REMERON) 30 MG tablet Take 1 tablet (30 mg total) by mouth at bedtime. 90 tablet 1  . montelukast (SINGULAIR) 10 MG tablet Take 1 tablet by mouth once daily 30 tablet 5  . Multiple Vitamins-Minerals (MULTIVITAMIN WITH MINERALS) tablet Take 1 tablet by mouth daily.    . nebivolol (BYSTOLIC) 5 MG tablet Take 1 tablet (5 mg total) by mouth daily. 90 tablet 2  .  ondansetron (ZOFRAN-ODT) 4 MG disintegrating tablet Take 4 mg by mouth every 8 (eight) hours as needed for nausea or vomiting.   0  . Polyethyl Glyc-Propyl Glyc PF (SYSTANE PRESERVATIVE FREE) 0.4-0.3 % SOLN Apply 2 drops to eye 2 (two) times a day.     . progesterone (PROMETRIUM) 100 MG capsule Take 100 mg by mouth at bedtime.    . SUMAtriptan (IMITREX) 100 MG tablet Take 0.5-1 tablets (50-100 mg total) by mouth every 2 (two) hours as needed for migraine. May repeat in 2 hours if headache persists or recurs. 12 tablet 5  . SUMAtriptan (IMITREX) 6 MG/0.5ML SOLN injection Inject 6 mg into the skin every 2 (two) hours as needed for migraine or headache. Reported on 08/11/2015    . traMADol (ULTRAM) 50 MG tablet Take 50 mg by mouth every 6 (six) hours as needed (for pain).     . triamcinolone (NASACORT ALLERGY 24HR) 55 MCG/ACT AERO nasal inhaler Use 1 spray in each nostril once daily (Patient taking differently: Place 1 spray into the nose daily. ) 1 Inhaler 5  . triamterene-hydrochlorothiazide (MAXZIDE-25) 37.5-25 MG tablet Take 1 tablet by mouth daily. 30 tablet 11  . carvedilol (COREG) 12.5 MG tablet Take 1 tablet (12.5 mg total) by mouth 2 (two) times daily. 60 tablet 3  . benzonatate (TESSALON) 100 MG capsule Take 1 capsule (100 mg total) by mouth 3 (three) times daily. (Patient not taking: Reported on 10/03/2018) 30 capsule 1   No facility-administered medications prior to visit.     ROS: Review of Systems  Constitutional: Negative for activity change, appetite change, chills, diaphoresis, fatigue, fever and unexpected weight change.  HENT: Negative for congestion, dental problem, ear pain, hearing loss, mouth sores, postnasal drip, sinus pressure, sneezing, sore throat and voice change.   Eyes: Negative for pain and visual disturbance.  Respiratory: Negative for cough, chest tightness, wheezing and stridor.   Cardiovascular: Negative for chest pain, palpitations and leg swelling.   Gastrointestinal: Negative for abdominal distention, abdominal pain, blood in stool, nausea, rectal pain and vomiting.  Genitourinary: Negative for decreased urine volume, difficulty urinating, dysuria, frequency, hematuria, menstrual problem, vaginal bleeding, vaginal discharge and vaginal pain.  Musculoskeletal: Positive for arthralgias. Negative for back pain, gait problem, joint swelling and neck pain.  Skin: Negative for color change, rash and wound.  Neurological: Negative for dizziness, tremors, syncope, speech difficulty, weakness and light-headedness.  Hematological: Negative for adenopathy.  Psychiatric/Behavioral: Negative for behavioral problems, confusion, decreased concentration, dysphoric mood, hallucinations, sleep disturbance and suicidal ideas. The patient is not nervous/anxious and is not hyperactive.     Objective:  BP 120/72 (BP Location: Left Arm, Patient Position: Sitting, Cuff Size: Normal)   Pulse 66  Temp 98 F (36.7 C) (Oral)   Ht 5\' 2"  (1.575 m)   Wt 150 lb (68 kg)   SpO2 97%   BMI 27.44 kg/m   BP Readings from Last 3 Encounters:  10/18/18 120/72  10/03/18 (!) 140/91  06/28/18 128/90    Wt Readings from Last 3 Encounters:  10/18/18 150 lb (68 kg)  10/03/18 140 lb (63.5 kg)  06/08/18 154 lb 9.6 oz (70.1 kg)    Physical Exam Constitutional:      General: She is not in acute distress.    Appearance: She is well-developed.  HENT:     Head: Normocephalic.     Right Ear: External ear normal.     Left Ear: External ear normal.     Nose: Nose normal.  Eyes:     General:        Right eye: No discharge.        Left eye: No discharge.     Conjunctiva/sclera: Conjunctivae normal.     Pupils: Pupils are equal, round, and reactive to light.  Neck:     Musculoskeletal: Normal range of motion and neck supple.     Thyroid: No thyromegaly.     Vascular: No JVD.     Trachea: No tracheal deviation.  Cardiovascular:     Rate and Rhythm: Normal rate and  regular rhythm.     Heart sounds: Normal heart sounds.  Pulmonary:     Effort: No respiratory distress.     Breath sounds: No stridor. No wheezing.  Abdominal:     General: Bowel sounds are normal. There is no distension.     Palpations: Abdomen is soft. There is no mass.     Tenderness: There is no abdominal tenderness. There is no guarding or rebound.  Musculoskeletal:        General: No tenderness.  Lymphadenopathy:     Cervical: No cervical adenopathy.  Skin:    Findings: No erythema or rash.  Neurological:     Cranial Nerves: No cranial nerve deficit.     Motor: No abnormal muscle tone.     Coordination: Coordination normal.     Deep Tendon Reflexes: Reflexes normal.  Psychiatric:        Behavior: Behavior normal.        Thought Content: Thought content normal.        Judgment: Judgment normal.     Lab Results  Component Value Date   WBC 6.3 10/03/2018   HGB 11.3 (L) 10/03/2018   HCT 34.8 (L) 10/03/2018   PLT 301 10/03/2018   GLUCOSE 113 (H) 10/03/2018   CHOL 226 (H) 08/26/2017   TRIG 513 (H) 08/26/2017   HDL 37 (L) 08/26/2017   LDLDIRECT 161.0 05/05/2016   LDLCALC UNABLE TO CALCULATE IF TRIGLYCERIDE OVER 400 mg/dL 08/26/2017   ALT 19 10/11/2017   AST 24 10/11/2017   NA 137 10/03/2018   K 4.1 10/03/2018   CL 105 10/03/2018   CREATININE 1.32 (H) 10/03/2018   BUN 14 10/03/2018   CO2 23 10/03/2018   TSH 1.62 05/02/2017   INR 0.98 08/26/2017   HGBA1C 5.5 06/27/2017    Ct Head Wo Contrast  Result Date: 10/03/2018 CLINICAL DATA:  Dizziness with syncope EXAM: CT HEAD WITHOUT CONTRAST TECHNIQUE: Contiguous axial images were obtained from the base of the skull through the vertex without intravenous contrast. COMPARISON:  Oct 13, 2016 FINDINGS: Brain: The ventricles are normal in size and configuration. 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  There is no intracranial mass, hemorrhage, extra-axial fluid collection, or midline shift. Brain parenchyma  appears unremarkable. No evident acute infarct. Vascular: No hyperdense vessel. There is calcification in each carotid siphon region. Skull: The bony calvarium appears intact. Sinuses/Orbits: There is mild mucosal thickening in several ethmoid air cells. Other visualized paranasal sinuses are clear. Orbits appear symmetric bilaterally. Other: Mastoid air cells are clear. IMPRESSION: Frontal atrophy bilaterally, stable. Ventricles normal in size and configuration. Brain parenchyma appears unremarkable. No acute infarct evident. No mass or hemorrhage. There are foci of arterial vascular calcification. There is mild mucosal thickening in several ethmoid air cells. Electronically Signed   By: Lowella Grip III M.D.   On: 10/03/2018 13:30    Assessment & Plan:   There are no diagnoses linked to this encounter.   No orders of the defined types were placed in this encounter.    Follow-up: No follow-ups on file.  Walker Kehr, MD

## 2018-10-18 NOTE — Assessment & Plan Note (Signed)
Xanax prn 

## 2018-10-18 NOTE — Assessment & Plan Note (Signed)
Synthroid 

## 2018-10-18 NOTE — Assessment & Plan Note (Signed)
Reduce Atacand dose Hydration multiplier po

## 2018-10-19 ENCOUNTER — Telehealth: Payer: Self-pay | Admitting: Allergy and Immunology

## 2018-10-19 ENCOUNTER — Other Ambulatory Visit (INDEPENDENT_AMBULATORY_CARE_PROVIDER_SITE_OTHER): Payer: Medicare Other

## 2018-10-19 DIAGNOSIS — E039 Hypothyroidism, unspecified: Secondary | ICD-10-CM | POA: Diagnosis not present

## 2018-10-19 LAB — IRON,TIBC AND FERRITIN PANEL
%SAT: 21 % (calc) (ref 16–45)
Ferritin: 79 ng/mL (ref 16–288)
Iron: 84 ug/dL (ref 45–160)
TIBC: 395 mcg/dL (calc) (ref 250–450)

## 2018-10-19 LAB — T4, FREE: Free T4: 1.08 ng/dL (ref 0.60–1.60)

## 2018-10-19 NOTE — Telephone Encounter (Signed)
[name] called in and states she went to pick up [facility] at the pharmacy and it was $300.  She would like to know what to replace the [facility] with that could be cheaper?  Please advise?

## 2018-10-23 ENCOUNTER — Ambulatory Visit (INDEPENDENT_AMBULATORY_CARE_PROVIDER_SITE_OTHER): Payer: Medicare Other | Admitting: Psychology

## 2018-10-23 ENCOUNTER — Other Ambulatory Visit: Payer: Self-pay | Admitting: Internal Medicine

## 2018-10-23 DIAGNOSIS — F4321 Adjustment disorder with depressed mood: Secondary | ICD-10-CM

## 2018-10-23 DIAGNOSIS — N183 Chronic kidney disease, stage 3 unspecified: Secondary | ICD-10-CM

## 2018-10-24 ENCOUNTER — Other Ambulatory Visit: Payer: Self-pay

## 2018-10-24 MED ORDER — FLUTICASONE FUROATE 200 MCG/ACT IN AEPB: 1.0000 | INHALATION_SPRAY | Freq: Every day | RESPIRATORY_TRACT | 2 refills | Status: DC

## 2018-10-24 NOTE — Telephone Encounter (Signed)
Arnuity 200 - 1 inhalation daily

## 2018-10-24 NOTE — Telephone Encounter (Signed)
Called and talked with patient. She tried the Flovent that we sent in but it was not as helpful as the Asmanex.  It looks like insurance will also cover [place]. Can we change patient to [place]? 100 or 200?  Please advise.

## 2018-10-24 NOTE — Telephone Encounter (Signed)
Patient informed and [place] sent to [facility] as requested.

## 2018-10-30 ENCOUNTER — Telehealth: Payer: Self-pay | Admitting: Internal Medicine

## 2018-10-30 DIAGNOSIS — G319 Degenerative disease of nervous system, unspecified: Secondary | ICD-10-CM

## 2018-10-30 NOTE — Telephone Encounter (Signed)
Referral made 

## 2018-10-30 NOTE — Telephone Encounter (Signed)
Can you please put a new referral in for [facility]

## 2018-10-30 NOTE — Telephone Encounter (Signed)
Patient called in leaving VM that Dr. Alain Marion referred her to [facility].  Patient states she can not see  neurology due to no shows.  She is requesting referral to be sent to [facility].  Please follow up with patient in regard.

## 2018-10-31 ENCOUNTER — Other Ambulatory Visit: Payer: Self-pay | Admitting: Internal Medicine

## 2018-10-31 DIAGNOSIS — G319 Degenerative disease of nervous system, unspecified: Secondary | ICD-10-CM

## 2018-10-31 NOTE — Telephone Encounter (Signed)
Referral sent to [facility]

## 2018-11-01 ENCOUNTER — Ambulatory Visit (INDEPENDENT_AMBULATORY_CARE_PROVIDER_SITE_OTHER): Payer: Medicare Other | Admitting: Psychology

## 2018-11-01 ENCOUNTER — Telehealth: Payer: Self-pay | Admitting: Diagnostic Neuroimaging

## 2018-11-01 DIAGNOSIS — F4321 Adjustment disorder with depressed mood: Secondary | ICD-10-CM | POA: Diagnosis not present

## 2018-11-01 DIAGNOSIS — N3000 Acute cystitis without hematuria: Secondary | ICD-10-CM | POA: Diagnosis not present

## 2018-11-01 NOTE — Telephone Encounter (Signed)
Pt gave consent for VV on the phone/ Pt understands that although there may be some limitations with this type of visit, we will take all precautions to reduce any security or privacy concerns.  Pt understands that this will be treated like an in office visit and we will file with pt's insurance, and there may be a patient responsible charge related to this service. Sent email w link to [email or web address]

## 2018-11-06 ENCOUNTER — Ambulatory Visit (INDEPENDENT_AMBULATORY_CARE_PROVIDER_SITE_OTHER): Payer: Medicare Other | Admitting: Psychology

## 2018-11-06 DIAGNOSIS — F4321 Adjustment disorder with depressed mood: Secondary | ICD-10-CM | POA: Diagnosis not present

## 2018-11-07 ENCOUNTER — Encounter: Payer: Self-pay | Admitting: Diagnostic Neuroimaging

## 2018-11-07 NOTE — Telephone Encounter (Signed)
Spoke with patient and updated EMR. 

## 2018-11-08 ENCOUNTER — Other Ambulatory Visit: Payer: Self-pay

## 2018-11-08 ENCOUNTER — Ambulatory Visit (INDEPENDENT_AMBULATORY_CARE_PROVIDER_SITE_OTHER): Payer: Medicare Other | Admitting: Diagnostic Neuroimaging

## 2018-11-08 ENCOUNTER — Encounter: Payer: Self-pay | Admitting: Diagnostic Neuroimaging

## 2018-11-08 DIAGNOSIS — G3184 Mild cognitive impairment, so stated: Secondary | ICD-10-CM | POA: Diagnosis not present

## 2018-11-08 DIAGNOSIS — F329 Major depressive disorder, single episode, unspecified: Secondary | ICD-10-CM

## 2018-11-08 DIAGNOSIS — F419 Anxiety disorder, unspecified: Secondary | ICD-10-CM

## 2018-11-08 DIAGNOSIS — F32A Depression, unspecified: Secondary | ICD-10-CM

## 2018-11-08 NOTE — Progress Notes (Signed)
[facility]  PATIENT: [name] DOB: [birth year]  REFERRING CLINICIAN: Plotnikov HISTORY FROM: patient  REASON FOR VISIT: new consult    HISTORICAL  CHIEF COMPLAINT:  Chief Complaint  Patient presents with   abnl CT    HISTORY OF PRESENT ILLNESS:   68 year old female here for evaluation of "brain atrophy".  May 2020 patient had a near syncope event, went to the ER and was diagnosed with orthostatic hypotension and positive UTI.  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  Patient reviewed her prior CT scan reports which did not clearly mention this and therefore patient was concerned about the onset or progression of brain atrophy.  Therefore patient was referred here for further evaluation.  Patient has had history of concussion in the past.  She has had mild cognitive impairment diagnosed in the past, felt to be related to anxiety and depression.  Patient continues to have some mild forgetfulness and memory problems.  She continues to have issues with anxiety and depression.  She has noticed some clumsiness and dropping things.  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    REVIEW OF SYSTEMS: Full 14 system review of systems performed and negative with exception of: As per HPI.  ALLERGIES: Allergies  Allergen Reactions   Buprenorphine Hcl Other (See Comments)    Severe headache, confusion   Morphine And Related Other (See Comments)    Severe headache   Keflex [Cephalexin] Nausea And Vomiting   Other Other (See Comments)    Trees & Grass = allergic symptoms   Codeine Other (See Comments)    Disorientation   Dust Mite Extract Cough   Molds & Smuts Cough    HOME MEDICATIONS: Outpatient Medications Prior to Visit  Medication Sig Dispense Refill   albuterol (PROAIR HFA) 108 (90 Base) MCG/ACT inhaler  Inhale 2 puffs every 4-6 hours as needed for cough, wheeze, shortness of breath or chest tightness 18 g 1   ALPRAZolam (XANAX) 0.5 MG tablet Take 1 tablet (0.5 mg total) by mouth 2 (two) times daily as needed for anxiety. 60 tablet 2   ASMANEX HFA 200 MCG/ACT AERO Inhale 2 puffs into the lungs daily. With spacer 13 g 5   aspirin EC 81 MG tablet Take 81 mg by mouth daily.     busPIRone (BUSPAR) 10 MG tablet Take 2 tablets (20 mg total) by mouth 2 (two) times daily. (Patient taking differently: Take 30 mg by mouth 2 (two) times daily. ) 360 tablet 1   Calcium Carb-Cholecalciferol (CALCIUM + D3) 600-800 MG-UNIT TABS Take 1 tablet by mouth daily.     candesartan (ATACAND) 16 MG tablet Take 0.5 tablets (8 mg total) by mouth daily. 90 tablet 1   carvedilol (COREG) 12.5 MG tablet Take 1 tablet (12.5 mg total) by mouth 2 (two) times daily. 60 tablet 3   Cholecalciferol (D3-1000) 1000 units capsule Take 1,000 Units by mouth daily.     Cyanocobalamin (VITAMIN B-12) 1000 MCG SUBL Place 1 tablet (1,000 mcg total) under the tongue daily. (Patient taking differently: Place 2,000 mcg under the tongue daily. ) 100 tablet 3   cyclobenzaprine (FLEXERIL) 10 MG tablet Take 10 mg by mouth 2 (two) times daily as needed for muscle spasms.      D-Mannose 500 MG CAPS Take 500 mg by mouth daily. WITH CRANBERRY & DANDELION EXTRACT  esomeprazole (NEXIUM) 40 MG capsule Take 1 capsule by mouth daily in the morning 30 capsule 5   estradiol (ESTRACE) 1 MG tablet Take 1 tablet (1 mg total) by mouth daily. 90 tablet 3   famotidine (PEPCID) 40 MG tablet Take 1 tablet by mouth daily in the evening 30 tablet 5   FLUoxetine (PROZAC) 40 MG capsule Take 1 capsule (40 mg total) by mouth every morning. 90 capsule 1   fluticasone (FLOVENT HFA) 110 MCG/ACT inhaler Inhale 2 puffs into the lungs 2 (two) times daily. (Patient taking differently: Inhale 2 puffs into the lungs as needed (shortness of breath). ) 1 Inhaler 5    Fluticasone Furoate (ARNUITY ELLIPTA) 200 MCG/ACT AEPB Inhale 1 Dose into the lungs daily. Rinse, gargle and spit after use. 30 each 2   hydrochlorothiazide (MICROZIDE) 12.5 MG capsule TAKE 1 CAPSULE BY MOUTH EVERY DAY (Patient taking differently: Take 12.5 mg by mouth daily. ) 90 capsule 1   ipratropium (ATROVENT) 0.06 % nasal spray Use 1-2 sprays in each nostril every 6 hours as needed (Patient taking differently: Place into the nose daily. ) 15 mL 5   iron polysaccharides (FERREX 150) 150 MG capsule Take 1 capsule (150 mg total) by mouth 2 (two) times daily. 180 capsule 3   Lactobacillus ([facility] PROBIOTIC ACIDOPHILUS PO) Take 1 capsule by mouth daily.     levothyroxine (SYNTHROID, LEVOTHROID) 100 MCG tablet Take 1 tablet (100 mcg total) by mouth daily before breakfast. 90 tablet 2   meclizine (ANTIVERT) 25 MG tablet Take 1 tablet (25 mg total) by mouth 3 (three) times daily as needed for dizziness. 30 tablet 0   mirtazapine (REMERON) 30 MG tablet Take 1 tablet (30 mg total) by mouth at bedtime. 90 tablet 1   montelukast (SINGULAIR) 10 MG tablet Take 1 tablet by mouth once daily 30 tablet 5   Multiple Vitamins-Minerals (MULTIVITAMIN WITH MINERALS) tablet Take 1 tablet by mouth daily.     nebivolol (BYSTOLIC) 5 MG tablet Take 1 tablet (5 mg total) by mouth daily. 90 tablet 2   ondansetron (ZOFRAN-ODT) 4 MG disintegrating tablet Take 4 mg by mouth every 8 (eight) hours as needed for nausea or vomiting.   0   Polyethyl Glyc-Propyl Glyc PF (SYSTANE PRESERVATIVE FREE) 0.4-0.3 % SOLN Apply 2 drops to eye 2 (two) times a day.      progesterone (PROMETRIUM) 100 MG capsule Take 100 mg by mouth at bedtime.     SUMAtriptan (IMITREX) 100 MG tablet Take 0.5-1 tablets (50-100 mg total) by mouth every 2 (two) hours as needed for migraine. May repeat in 2 hours if headache persists or recurs. 12 tablet 5   SUMAtriptan (IMITREX) 6 MG/0.5ML SOLN injection Inject 6 mg into the skin every 2 (two) hours as  needed for migraine or headache. Reported on 08/11/2015     traMADol (ULTRAM) 50 MG tablet Take 50 mg by mouth every 6 (six) hours as needed (for pain).      triamcinolone (NASACORT ALLERGY 24HR) 55 MCG/ACT AERO nasal inhaler Use 1 spray in each nostril once daily (Patient taking differently: Place 1 spray into the nose daily. ) 1 Inhaler 5   triamterene-hydrochlorothiazide (MAXZIDE-25) 37.5-25 MG tablet Take 1 tablet by mouth daily. 30 tablet 11   No facility-administered medications prior to visit.     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   Irritable bowel syndrome with diarrhea 09/12/2014   Dr Olevia Perches   IRRITABLE BOWEL SYNDROME, HX OF 03/04/2007   Qualifier: Diagnosis of  By: Quentin Cornwall CMA, Jessica     Laryngopharyngeal reflux (LPR) 06/15/2016   Leg pain, anterior, left 12/15/2016   8/18 2/19 L   Leiomyosarcoma ([place]) 09/10/2013   Vaginal wall - rare tumor 12/12 Dr Matthew Saras S/p chemo   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   Lung mass 09/10/2013   Lung nodule, solitary 09/10/2013   Malaise and fatigue 04/16/2015   Menopause 12/05/2013   Migraine headache    Mild cognitive disorder 12/05/2013   MUSCLE PAIN 07/03/2008   Qualifier: Diagnosis of  By: Jenny Reichmann MD, Hunt Oris    Other and unspecified hyperlipidemia    Parathyroid adenoma 2014   Paresthesia 12/15/2016   8/18 L lat foot - ?peroneal nerve damage   Positive skin test 03/26/2014   Post concussion syndrome 10/28/2014   Severe episode of recurrent major depressive disorder, without psychotic features ([place]) 12/18/2014   On Fluoxetine    Status post chemotherapy 2014   Syncope 10/13/2016   Unspecified hypothyroidism 04/25/2013   Vitamin D deficiency 04/25/2013    PAST SURGICAL HISTORY: Past Surgical History:  Procedure Laterality Date   ANTERIOR FUSION CERVICAL SPINE  2006   BLADDER SURGERY  06/2016   [facility], piece of mesh with necrotic tissue removed   CATARACT EXTRACTION, BILATERAL  2019   CESAREAN SECTION  1986   CHOLECYSTECTOMY N/A 05/03/2013   Procedure: LAPAROSCOPIC CHOLECYSTECTOMY WITH INTRAOPERATIVE CHOLANGIOGRAM;  Surgeon: Odis Hollingshead, MD;  Location: [facility];  Service: General;  Laterality: N/A;   COLONOSCOPY     herniated discectomy  2019   laprotomy  04/2012   for cancer   NECK SURGERY  2006   C spine fusion   POSTERIOR FUSION CERVICAL SPINE  2016   PUBOVAGINAL SLING     RIGHT/LEFT HEART CATH AND CORONARY  ANGIOGRAPHY N/A 08/26/2017   Procedure: RIGHT/LEFT HEART CATH AND CORONARY ANGIOGRAPHY;  Surgeon:  Burnell Blanks, MD;  Location: [facility];  Service: Cardiovascular;  Laterality: N/A;   VAGINAL HYSTERECTOMY      FAMILY HISTORY: Family History  Problem Relation Age of Onset   Coronary artery disease Father    Heart disease Father    Hypertension Father    Irritable bowel syndrome Father    Arthritis Mother    Hypertension Mother    Migraines Mother    Heart disease Mother    Diabetes Maternal Grandmother    Allergies Maternal Grandmother    Irritable bowel syndrome Sister    Irritable bowel syndrome Brother    Colon cancer Neg Hx    Esophageal cancer Neg Hx    Rectal cancer Neg Hx    Stomach cancer Neg Hx     SOCIAL HISTORY: Social History   Socioeconomic History   Marital status: Divorced    Spouse name: Not on file   Number of children: 2   Years of education: 18   Highest education level: Not on file  Occupational History   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  Social Designer, fashion/clothing strain: Not hard at all   Food insecurity    Worry: Never true    Inability: Never true   Transportation needs    Medical: No    Non-medical: No  Tobacco Use   Smoking status: Never Smoker   Smokeless tobacco: Never Used  Substance and Sexual Activity   Alcohol use: Yes    Alcohol/week: 2.0 - 3.0 standard drinks    Types: 2 - 3 Standard drinks or equivalent per week    Comment: rarely   Drug use: No   Sexual activity: Not Currently    Partners: Male  Lifestyle   Physical activity    Days per week: 5 days    Minutes per session: 40 min   Stress: Not at all  Relationships   Social connections    Talks on phone: More than three times a week    Gets together: More than three times a week    Attends religious service: More than 4 times per year    Active member of club or organization: Yes      Attends meetings of clubs or organizations: More than 4 times per year    Relationship status: Divorced   Intimate partner violence    Fear of current or ex partner: Not on file    Emotionally abused: Not on file    Physically abused: Not on file    Forced sexual activity: Not on file  Other Topics Concern   Not on file  Social History Narrative   Married 4 years- divorced; married 10 years- divorced. Serially monogamous relationships. Remarried March '11. 2 sons- '82, '87.    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 (June "12).    Lives in her own home.  Currently going through a divorce.     PHYSICAL EXAM   VIDEO EXAM  GENERAL EXAM/CONSTITUTIONAL:  Vitals: There were no vitals filed for this visit.  There is no height or weight on file to calculate BMI. Wt Readings from Last 3 Encounters:  10/18/18 150 lb (68 kg)  10/03/18 140 lb (63.5 kg)  06/08/18 154 lb 9.6 oz (70.1 kg)     Patient is in no distress; well developed, nourished and groomed; neck is supple   NEUROLOGIC: MENTAL STATUS:  No flowsheet data found.  awake, alert, oriented to person, place and time  recent and remote memory intact  normal attention and concentration  language fluent, comprehension intact, naming intact  fund of knowledge appropriate  CRANIAL NERVE:   2nd, 3rd, 4th, 6th - visual fields full to confrontation, extraocular muscles intact, no nystagmus  5th - facial sensation symmetric  7th - facial strength symmetric  8th - hearing intact  11th - shoulder shrug symmetric  12th - tongue protrusion midline  MOTOR:   NO TREMOR; NO DRIFT IN BUE  SENSORY:   normal and symmetric to light touch  COORDINATION:   fine finger movements normal .     DIAGNOSTIC DATA (LABS, IMAGING, TESTING) - I reviewed patient records, labs, notes, testing and imaging myself where available.  Lab Results   Component Value Date   WBC 6.8 10/18/2018   HGB 12.5 10/18/2018   HCT 35.8 (L) 10/18/2018   MCV 90.9 10/18/2018   PLT 402.0 (H) 10/18/2018      Component Value Date/Time   NA 131 (L) 10/18/2018 1422   NA 137 02/16/2018 0910   K 4.5 10/18/2018 1422   CL 98 10/18/2018 1422   CO2 27 10/18/2018 1422   GLUCOSE 86 10/18/2018 1422   BUN 24 (H) 10/18/2018 1422   BUN 12 02/16/2018 0910   BUN 19.9 08/11/2015 1343   CREATININE 1.29 (H) 10/18/2018 1422   CREATININE 1.0 08/11/2015 1343   CALCIUM 10.3 10/18/2018 1422   PROT 7.0 10/18/2018 1422   ALBUMIN 4.1 10/18/2018 1422   AST 20 10/18/2018 1422   ALT 16 10/18/2018 1422   ALKPHOS 84 10/18/2018 1422   BILITOT 0.4 10/18/2018 1422   GFRNONAA 41 (L) 10/03/2018 1309   GFRAA 48 (L) 10/03/2018 1309   Lab Results  Component Value Date   CHOL 263 (H) 10/18/2018   HDL 45.40 10/18/2018   LDLCALC UNABLE TO CALCULATE IF TRIGLYCERIDE OVER 400 mg/dL 08/26/2017   LDLDIRECT 160.0 10/18/2018   TRIG 271.0 (H) 10/18/2018   CHOLHDL 6 10/18/2018   Lab Results  Component Value Date   HGBA1C 5.5 06/27/2017   Lab Results  Component Value Date   VITAMINB12 >1500 (H) 12/29/2016   Lab Results  Component Value Date   TSH 0.32 (L) 10/18/2018    09/20/11 CT head [I reviewed images myself and agree with interpretation. Enlarged CSF spaces frontally without atrophy. -VRP]  - normal  09/12/14 CT head [I reviewed images myself and agree with interpretation. -VRP]  - Prominent bifrontal CSF, but no sulcal enlargement or mass effect on the brain to suggest frontal atrophy or subdural collection  1. No evidence of acute intracranial or cervical spine injury. 2. C3-4 to C6-7 discectomies. Although there is no bony fusion across the C6-7 level, no hardware failure. 3. Degenerative changes noted above.  10/03/18 CT head [I reviewed images myself and disagree with interpretation. Enlarged CSF spaces frontally without atrophy. -VRP]  - Frontal atrophy  bilaterally, stable.    ASSESSMENT AND PLAN  68 y.o. year old female here with:   Dx:  1. MCI (mild cognitive impairment)   2. Anxiety   3. Depression, unspecified depression type    Virtual Visit via Video Note  I connected with [name] on 11/08/18 at  9:00 AM EDT by a video enabled telemedicine application and verified that I am speaking with the correct person using two identifiers.  Location: Patient: home Provider: office   I discussed the limitations of  evaluation and management by telemedicine and the availability of in person appointments. The patient expressed understanding and agreed to proceed.   I discussed the assessment and treatment plan with the patient. The patient was provided an opportunity to ask questions and all were answered. The patient agreed with the plan and demonstrated an understanding of the instructions.   The patient was advised to call back or seek an in-person evaluation if the symptoms worsen or if the condition fails to improve as anticipated.  I provided 30 minutes of non-face-to-face time during this encounter.    PLAN:  "FRONTAL ATROPHY" ON CT HEAD - likely incidental finding; more likely to reflect prominent CSF spaces frontally, rather than underlying brain atrophy; the underlying sulci appear normal; this finding is stable and unchanged from CT scans between 2013 and 2020.  MILD COGNITIVE IMPAIRMENT (likely related to underlying anxiety and depression) - continue treatments for underlying mood d/o - continue to optimize brain healthy habits (exercise, nutrition, sleep, stimulating activities) --> patient already doing well in these areas  Return for return to PCP, pending if symptoms worsen or fail to improve.    Penni Bombard, MD 0/98/1191, 47:82 AM Certified in Neurology, Neurophysiology and Neuroimaging  [facility] [address] [place], [place] [address] [phone]

## 2018-11-15 ENCOUNTER — Ambulatory Visit (INDEPENDENT_AMBULATORY_CARE_PROVIDER_SITE_OTHER): Payer: Medicare Other | Admitting: Psychology

## 2018-11-15 DIAGNOSIS — F4321 Adjustment disorder with depressed mood: Secondary | ICD-10-CM

## 2018-11-16 DIAGNOSIS — N3020 Other chronic cystitis without hematuria: Secondary | ICD-10-CM | POA: Diagnosis not present

## 2018-11-16 DIAGNOSIS — N390 Urinary tract infection, site not specified: Secondary | ICD-10-CM | POA: Diagnosis not present

## 2018-11-19 ENCOUNTER — Other Ambulatory Visit: Payer: Self-pay | Admitting: Allergy and Immunology

## 2018-11-20 ENCOUNTER — Other Ambulatory Visit: Payer: Self-pay | Admitting: Cardiology

## 2018-11-22 ENCOUNTER — Ambulatory Visit (INDEPENDENT_AMBULATORY_CARE_PROVIDER_SITE_OTHER): Payer: Medicare Other | Admitting: Psychology

## 2018-11-22 DIAGNOSIS — F4321 Adjustment disorder with depressed mood: Secondary | ICD-10-CM

## 2018-11-22 NOTE — Telephone Encounter (Signed)
Carvedilol refill sent to [facility] [phone]

## 2018-11-28 DIAGNOSIS — N183 Chronic kidney disease, stage 3 (moderate): Secondary | ICD-10-CM | POA: Diagnosis not present

## 2018-11-28 DIAGNOSIS — N3000 Acute cystitis without hematuria: Secondary | ICD-10-CM | POA: Diagnosis not present

## 2018-11-28 DIAGNOSIS — I129 Hypertensive chronic kidney disease with stage 1 through stage 4 chronic kidney disease, or unspecified chronic kidney disease: Secondary | ICD-10-CM | POA: Diagnosis not present

## 2018-12-06 ENCOUNTER — Ambulatory Visit (INDEPENDENT_AMBULATORY_CARE_PROVIDER_SITE_OTHER): Payer: Medicare Other | Admitting: Psychology

## 2018-12-06 DIAGNOSIS — F4321 Adjustment disorder with depressed mood: Secondary | ICD-10-CM | POA: Diagnosis not present

## 2018-12-07 ENCOUNTER — Other Ambulatory Visit: Payer: Self-pay | Admitting: Nephrology

## 2018-12-07 DIAGNOSIS — N3000 Acute cystitis without hematuria: Secondary | ICD-10-CM

## 2018-12-13 ENCOUNTER — Ambulatory Visit (INDEPENDENT_AMBULATORY_CARE_PROVIDER_SITE_OTHER): Payer: Medicare Other | Admitting: Psychology

## 2018-12-13 DIAGNOSIS — F4321 Adjustment disorder with depressed mood: Secondary | ICD-10-CM

## 2018-12-16 ENCOUNTER — Other Ambulatory Visit: Payer: Self-pay | Admitting: Internal Medicine

## 2018-12-16 ENCOUNTER — Other Ambulatory Visit: Payer: Self-pay | Admitting: Allergy and Immunology

## 2018-12-18 DIAGNOSIS — Z1231 Encounter for screening mammogram for malignant neoplasm of breast: Secondary | ICD-10-CM | POA: Diagnosis not present

## 2018-12-19 ENCOUNTER — Other Ambulatory Visit: Payer: Medicare Other

## 2018-12-20 ENCOUNTER — Ambulatory Visit (INDEPENDENT_AMBULATORY_CARE_PROVIDER_SITE_OTHER): Payer: Medicare Other | Admitting: Psychology

## 2018-12-20 DIAGNOSIS — F4321 Adjustment disorder with depressed mood: Secondary | ICD-10-CM

## 2018-12-22 ENCOUNTER — Other Ambulatory Visit: Payer: Medicare Other

## 2018-12-23 ENCOUNTER — Other Ambulatory Visit: Payer: Self-pay | Admitting: Allergy and Immunology

## 2018-12-23 ENCOUNTER — Other Ambulatory Visit: Payer: Self-pay | Admitting: Cardiology

## 2018-12-29 ENCOUNTER — Ambulatory Visit
Admission: RE | Admit: 2018-12-29 | Discharge: 2018-12-29 | Disposition: A | Discharge status: Home / Self Care | Payer: Medicare Other | Source: Ambulatory Visit | Attending: Nephrology | Admitting: Nephrology

## 2018-12-29 DIAGNOSIS — N200 Calculus of kidney: Secondary | ICD-10-CM | POA: Diagnosis not present

## 2018-12-29 DIAGNOSIS — N3000 Acute cystitis without hematuria: Secondary | ICD-10-CM

## 2019-01-01 ENCOUNTER — Other Ambulatory Visit: Payer: Medicare Other

## 2019-01-03 ENCOUNTER — Other Ambulatory Visit: Payer: Self-pay | Admitting: Allergy and Immunology

## 2019-01-03 ENCOUNTER — Other Ambulatory Visit: Payer: Self-pay | Admitting: Cardiology

## 2019-01-08 DIAGNOSIS — N3000 Acute cystitis without hematuria: Secondary | ICD-10-CM | POA: Diagnosis not present

## 2019-01-08 DIAGNOSIS — N183 Chronic kidney disease, stage 3 (moderate): Secondary | ICD-10-CM | POA: Diagnosis not present

## 2019-01-08 DIAGNOSIS — I129 Hypertensive chronic kidney disease with stage 1 through stage 4 chronic kidney disease, or unspecified chronic kidney disease: Secondary | ICD-10-CM | POA: Diagnosis not present

## 2019-01-17 ENCOUNTER — Ambulatory Visit (INDEPENDENT_AMBULATORY_CARE_PROVIDER_SITE_OTHER): Payer: Medicare Other | Admitting: Psychology

## 2019-01-17 DIAGNOSIS — F4321 Adjustment disorder with depressed mood: Secondary | ICD-10-CM | POA: Diagnosis not present

## 2019-01-18 NOTE — Progress Notes (Signed)
Cardiology Office Note:    Date:  01/19/2019   ID:  [name] [name], DOB [birth year], MRN [id number]  PCP:  Cassandria Anger, MD  Cardiologist:  Shirlee More, MD    Referring MD: Cassandria Anger, MD    ASSESSMENT:    1. Essential hypertension   2. CKD (chronic kidney disease) stage 3, GFR 30-59 ml/min (HCC)    PLAN:    In order of problems listed above:  1. Hypertension well-controlled BP at target managed now by CKD I think her regimen is appropriate and will continue the combination ARB calcium channel blocker beta-blocker and diuretic.  Recent labs I can access from June 2020 potassium 4.5 creatinine 1.40 2. Stable CKD   Next appointment: As needed   Medication Adjustments/Labs and Tests Ordered: Current medicines are reviewed at length with the patient today.  Concerns regarding medicines are outlined above.  No orders of the defined types were placed in this encounter.  No orders of the defined types were placed in this encounter.   Chief Complaint  Patient presents with  . Follow-up  . Hypertension    History of Present Illness:    [name] is a 68 y.o. female with a hx of hypertension CKD stage 3 and exertional dyspnea.  She underwent left and right heart catheterization 08/26/2017 with normal coronary arteriography and hemodynamics and left ventricular function.  last seen 02/16/2018. Compliance with diet, lifestyle and medications: Yes  I seen her before the uncontrolled hypertension shortness of breath and reassuring cardiac testing.  Her hypertension is well controlled now managed by nephrology with a combination diuretic beta-blocker and calcium channel blocker with stage III CKD she follows blood pressure at home closely well controlled no side effects pleased with her care.  We discussed my role and at this time I will see her back in the future on a as needed basis.  No chest pain edema shortness of breath or palpitation Past  Medical History:  Diagnosis Date  . Abdominal pain, generalized 03/26/2014  . Acute upper respiratory infection 05/05/2016   12/17  . Adaptive colitis 12/05/2013  . Adult hypothyroidism 12/05/2013   On Levothroid  . Allergic rhinitis, cause unspecified   . Anemia 05/31/2017   Iron def  . Anxiety   . Asthma    chronic cough  . Bipolar I disorder, most recent episode (or current) depressed, severe, without mention of psychotic behavior   . Blood transfusion without reported diagnosis   . 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 Cephalalgia 12/05/2013  . 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 Chest pain, atypical 07/13/2017   2/19 CT abd/chest - ok in June 2018  . Chronic depression 10/13/2016  . Cold intolerance 03/26/2014  . Concussion w/o coma 09/18/2014  . Cough variant asthma  vs UACS/ irritable larynx  03/26/2014   FENO 08/11/2016  =   17 p no rx    . Deep phlebothrombosis, postpartum 02/14/2008   Overview:  Overview:  Annotation: affected mostly left leg. Qualifier: Diagnosis of  By: Linda Hedges MD, Heinz Knuckles   . Deep phlebothrombosis, postpartum, unspecified as to episode of care(671.40)   . Depression   . Feeling bilious 12/05/2013  . Female genuine stress incontinence 04/12/2012  . Fibromyalgia   . Gastritis   . GERD 03/04/2007   Protonix   . GERD (gastroesophageal reflux disease)   . HTN (hypertension) 05/31/2017   1/19 Verapamil bid 2/19 added Maxzide  . Hyperlipidemia   .  IBS (irritable bowel syndrome)   . 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 Irritable bowel syndrome with diarrhea 09/12/2014   Dr Olevia Perches  . IRRITABLE BOWEL SYNDROME, HX OF 03/04/2007   Qualifier: Diagnosis of  By: Quentin Cornwall CMA, Janett Billow    . Laryngopharyngeal reflux (LPR) 06/15/2016  . Leg pain, anterior, left 12/15/2016   8/18 2/19 L  . 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 Lung mass 09/10/2013  . Lung  nodule, solitary 09/10/2013  . Malaise and fatigue 04/16/2015  . Menopause 12/05/2013  . Migraine headache   . Mild cognitive disorder 12/05/2013  . MUSCLE PAIN 07/03/2008   Qualifier: Diagnosis of  By: Jenny Reichmann MD, Hunt Oris   . Other and unspecified hyperlipidemia   . Parathyroid adenoma 2014  . Paresthesia 12/15/2016   8/18 L lat foot - ?peroneal nerve damage  . Positive skin test 03/26/2014  . Post concussion syndrome 10/28/2014  . Severe episode of recurrent major depressive disorder, without psychotic features ([place]) 12/18/2014   On Fluoxetine   . Status post chemotherapy 2014  . Syncope 10/13/2016  . Unspecified hypothyroidism 04/25/2013  . Vitamin D deficiency 04/25/2013    Past Surgical History:  Procedure Laterality Date  . ANTERIOR FUSION CERVICAL SPINE  2006  . BLADDER SURGERY  06/2016   [facility], piece of mesh with necrotic tissue removed  . CATARACT EXTRACTION, BILATERAL  2019  . CESAREAN SECTION  1986  . CHOLECYSTECTOMY N/A 05/03/2013   Procedure: LAPAROSCOPIC CHOLECYSTECTOMY WITH INTRAOPERATIVE CHOLANGIOGRAM;  Surgeon: Odis Hollingshead, MD;  Location: [facility];  Service: General;  Laterality: N/A;  . COLONOSCOPY    . herniated discectomy  2019  . laprotomy  04/2012   for cancer  . NECK SURGERY  2006   C spine fusion  . POSTERIOR FUSION CERVICAL SPINE  2016  . PUBOVAGINAL SLING    . RIGHT/LEFT HEART CATH AND CORONARY ANGIOGRAPHY N/A 08/26/2017   Procedure: RIGHT/LEFT HEART CATH AND CORONARY ANGIOGRAPHY;  Surgeon: Burnell Blanks, MD;  Location: [facility];  Service: Cardiovascular;  Laterality: N/A;  . VAGINAL HYSTERECTOMY      Current Medications: Current Meds  Medication Sig  . ALPRAZolam (XANAX) 0.5 MG tablet Take 1 tablet (0.5 mg total) by mouth 2 (two) times daily as needed for anxiety.  Marland Kitchen amLODipine (NORVASC) 5 MG tablet Take 5 mg by mouth daily.  Marland Kitchen aspirin EC 81 MG tablet Take 81 mg by mouth daily.  . busPIRone (BUSPAR) 10 MG tablet Take 2 tablets  (20 mg total) by mouth 2 (two) times daily. (Patient taking differently: Take 30 mg by mouth 2 (two) times daily. )  . carvedilol (COREG) 12.5 MG tablet TAKE 1 TABLET (12.5 MG TOTAL) BY MOUTH 2 (TWO) TIMES DAILY. NEEDS OFFICE VISIT FOR FURTHER REFILLS  . Cyanocobalamin (VITAMIN B-12) 1000 MCG SUBL Place 1 tablet (1,000 mcg total) under the tongue daily. (Patient taking differently: Place 2,000 mcg under the tongue daily. )  . cyclobenzaprine (FLEXERIL) 10 MG tablet Take 10 mg by mouth 2 (two) times daily as needed for muscle spasms.   . D-Mannose 500 MG CAPS Take 500 mg by mouth daily. WITH CRANBERRY & DANDELION EXTRACT  . esomeprazole (NEXIUM) 40 MG capsule TAKE 1 CAPSULE BY MOUTH EVERY MORNING  . estradiol (ESTRACE) 1 MG tablet Take 1 tablet (1 mg total) by mouth daily.  Marland Kitchen  FLUoxetine (PROZAC) 40 MG capsule Take 1 capsule (40 mg total) by mouth every morning.  . hydrochlorothiazide (MICROZIDE) 12.5 MG capsule TAKE 1 CAPSULE BY MOUTH EVERY DAY (Patient taking differently: Take 12.5 mg by mouth daily. )  . iron polysaccharides (FERREX 150) 150 MG capsule Take 1 capsule (150 mg total) by mouth 2 (two) times daily.  . Lactobacillus (CVS PROBIOTIC ACIDOPHILUS PO) Take 1 capsule by mouth daily.  Marland Kitchen levothyroxine (SYNTHROID, LEVOTHROID) 100 MCG tablet Take 1 tablet (100 mcg total) by mouth daily before breakfast.  . meclizine (ANTIVERT) 25 MG tablet Take 1 tablet (25 mg total) by mouth 3 (three) times daily as needed for dizziness.  . mirtazapine (REMERON) 30 MG tablet Take 1 tablet (30 mg total) by mouth at bedtime.  . Multiple Vitamins-Minerals (MULTIVITAMIN WITH MINERALS) tablet Take 1 tablet by mouth daily.  . ondansetron (ZOFRAN-ODT) 4 MG disintegrating tablet Take 4 mg by mouth every 8 (eight) hours as needed for nausea or vomiting.   Vladimir Faster Glyc-Propyl Glyc PF (SYSTANE PRESERVATIVE FREE) 0.4-0.3 % SOLN Apply 2 drops to eye 2 (two) times a day.   . progesterone (PROMETRIUM) 100 MG capsule Take  100 mg by mouth at bedtime.  . SUMAtriptan (IMITREX) 100 MG tablet TAKE 1/2-1 TABLET BY MOUTH EVERY 2 HOURS AS NEEDED FOR HEADACHE  . traMADol (ULTRAM) 50 MG tablet Take 50 mg by mouth every 6 (six) hours as needed (for pain).   . triamcinolone (NASACORT ALLERGY 24HR) 55 MCG/ACT AERO nasal inhaler Use 1 spray in each nostril once daily (Patient taking differently: Place 1 spray into the nose daily. )     Allergies:   Buprenorphine hcl, Morphine and related, Keflex [cephalexin], Other, Codeine, Dust mite extract, and Molds & smuts   Social History   Socioeconomic History  . Marital status: Divorced    Spouse name: Not on file  . Number of children: 2  . Years of education: 51  . Highest education level: Not on file  Occupational History  . 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 Financial resource strain: Not hard at all  . Food insecurity    Worry: Never true    Inability: Never true  . Transportation needs    Medical: No    Non-medical: No  Tobacco Use  . Smoking status: Never Smoker  . Smokeless tobacco: Never Used  Substance and Sexual Activity  . Alcohol use: Yes    Alcohol/week: 2.0 - 3.0 standard drinks    Types: 2 - 3 Standard drinks or equivalent per week    Comment: rarely  . Drug use: No  . Sexual activity: Not Currently    Partners: Male  Lifestyle  . Physical activity    Days per week: 5 days    Minutes per session: 40 min  . Stress: Not at all  Relationships  . Social connections    Talks on phone: More than three times a week    Gets together: More than three times a week    Attends religious service: More than 4 times per year    Active member of club or organization: Yes    Attends meetings of clubs or organizations: More than 4 times per year    Relationship status: Divorced  Other Topics Concern  . Not on file  Social History Narrative   Married 4 years- divorced; married 10 years- divorced. Serially  monogamous relationships. Remarried March '11. 2 sons- '82, '87.  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 (June "12).    Lives in her own home.  Currently going through a divorce.     Family History: The patient's family history includes Allergies in her maternal grandmother; Arthritis in her mother; Coronary artery disease in her father; Diabetes in her maternal grandmother; Heart disease in her father and mother; Hypertension in her father and mother; Irritable bowel syndrome in her brother, father, and sister; Migraines in her mother. There is no history of Colon cancer, Esophageal cancer, Rectal cancer, or Stomach cancer. ROS:   Please see the history of present illness.    All other systems reviewed and are negative.  EKGs/Labs/Other Studies Reviewed:    The following studies were reviewed today:  Recent Labs: 10/18/2018: ALT 16; BUN 24; Creatinine, Ser 1.29; Hemoglobin 12.5; Platelets 402.0; Potassium 4.5; Sodium 131; TSH 0.32  Recent Lipid Panel    Component Value Date/Time   CHOL 263 (H) 10/18/2018 1422   TRIG 271.0 (H) 10/18/2018 1422   HDL 45.40 10/18/2018 1422   CHOLHDL 6 10/18/2018 1422   VLDL 54.2 (H) 10/18/2018 1422   LDLCALC UNABLE TO CALCULATE IF TRIGLYCERIDE OVER 400 mg/dL 08/26/2017 0220   LDLDIRECT 160.0 10/18/2018 1422    Physical Exam:    VS:  BP 112/78 (BP Location: Right Arm, Patient Position: Sitting, Cuff Size: Normal)   Pulse 68   Ht 5\' 2"  (1.575 m)   Wt 149 lb 8 oz (67.8 kg)   BMI 27.34 kg/m     Wt Readings from Last 3 Encounters:  01/19/19 149 lb 8 oz (67.8 kg)  10/18/18 150 lb (68 kg)  10/03/18 140 lb (63.5 kg)     GEN:  Well nourished, well developed in no acute distress HEENT: Normal NECK: No JVD; No carotid bruits LYMPHATICS: No lymphadenopathy CARDIAC: RRR, no murmurs, rubs, gallops RESPIRATORY:  Clear to auscultation without rales, wheezing or rhonchi  ABDOMEN:  Soft, non-tender, non-distended MUSCULOSKELETAL:  No edema; No deformity  SKIN: Warm and dry NEUROLOGIC:  Alert and oriented x 3 PSYCHIATRIC:  Normal affect    Signed, Shirlee More, MD  01/19/2019 9:15 AM    [place]

## 2019-01-19 ENCOUNTER — Encounter: Payer: Self-pay | Admitting: Cardiology

## 2019-01-19 ENCOUNTER — Other Ambulatory Visit: Payer: Self-pay

## 2019-01-19 ENCOUNTER — Ambulatory Visit (INDEPENDENT_AMBULATORY_CARE_PROVIDER_SITE_OTHER): Payer: Medicare Other | Admitting: Cardiology

## 2019-01-19 ENCOUNTER — Encounter

## 2019-01-19 VITALS — BP 112/78 | HR 68 | Ht 62.0 in | Wt 149.5 lb

## 2019-01-19 DIAGNOSIS — I10 Essential (primary) hypertension: Secondary | ICD-10-CM

## 2019-01-19 DIAGNOSIS — N183 Chronic kidney disease, stage 3 unspecified: Secondary | ICD-10-CM

## 2019-01-19 NOTE — Patient Instructions (Signed)
Medication Instructions:  Your physician recommends that you continue on your current medications as directed. Please refer to the Current Medication list given to you today.  If you need a refill on your cardiac medications before your next appointment, please call your pharmacy.   Lab work: None If you have labs (blood work) drawn today and your tests are completely normal, you will receive your results only by: Marland Kitchen MyChart Message (if you have MyChart) OR . A paper copy in the mail If you have any lab test that is abnormal or we need to change your treatment, we will call you to review the results.  Testing/Procedures: None  Follow-Up: At [facility], you and your health needs are our priority.  As part of our continuing mission to provide you with exceptional heart care, we have created designated Provider Care Teams.  These Care Teams include your primary Cardiologist (physician) and Advanced Practice Providers (APPs -  Physician Assistants and Nurse Practitioners) who all work together to provide you with the care you need, when you need it. You will need a follow up appointment as needed.  Any Other Special Instructions Will Be Listed Below (If Applicable).

## 2019-02-05 ENCOUNTER — Telehealth: Payer: Self-pay | Admitting: Internal Medicine

## 2019-02-05 DIAGNOSIS — R8290 Unspecified abnormal findings in urine: Secondary | ICD-10-CM

## 2019-02-05 NOTE — Telephone Encounter (Signed)
Patient states she is having headache, nausea and bladder pain.  States urine is cloddy.  She is requesting Dr. Camila Li to place an order for a urinalysis.  Please follow up in regard.

## 2019-02-06 ENCOUNTER — Ambulatory Visit (INDEPENDENT_AMBULATORY_CARE_PROVIDER_SITE_OTHER): Payer: Medicare Other | Admitting: Psychology

## 2019-02-06 DIAGNOSIS — F4321 Adjustment disorder with depressed mood: Secondary | ICD-10-CM

## 2019-02-06 NOTE — Telephone Encounter (Signed)
Please advise or OV?

## 2019-02-07 NOTE — Telephone Encounter (Signed)
Ok UA Sch VOV tomorrow if needed Thx

## 2019-02-08 NOTE — Telephone Encounter (Signed)
Pt notified, labs ordered

## 2019-02-12 ENCOUNTER — Ambulatory Visit: Payer: Medicare Other | Admitting: Psychology

## 2019-02-12 ENCOUNTER — Telehealth: Payer: Self-pay

## 2019-02-12 ENCOUNTER — Other Ambulatory Visit (INDEPENDENT_AMBULATORY_CARE_PROVIDER_SITE_OTHER): Payer: Medicare Other

## 2019-02-12 DIAGNOSIS — R8290 Unspecified abnormal findings in urine: Secondary | ICD-10-CM | POA: Diagnosis not present

## 2019-02-12 LAB — URINALYSIS, ROUTINE W REFLEX MICROSCOPIC
Bilirubin Urine: NEGATIVE
Ketones, ur: NEGATIVE
Nitrite: NEGATIVE
Specific Gravity, Urine: 1.01 (ref 1.000–1.030)
Total Protein, Urine: NEGATIVE
Urine Glucose: NEGATIVE
Urobilinogen, UA: 0.2 (ref 0.0–1.0)
pH: 6 (ref 5.0–8.0)

## 2019-02-12 MED ORDER — CARVEDILOL 12.5 MG PO TABS: 12.5000 mg | ORAL_TABLET | Freq: Two times a day (BID) | ORAL | 0 refills | Status: DC

## 2019-02-12 NOTE — Telephone Encounter (Signed)
Requested Prescriptions   Signed Prescriptions Disp Refills  . carvedilol (COREG) 12.5 MG tablet 30 tablet 0    Sig: Take 1 tablet (12.5 mg total) by mouth 2 (two) times daily. NEEDS OFFICE VISIT FOR FURTHER REFILLS    Authorizing Provider: Richardo Priest    Ordering User: NEWCOMER MCCLAIN, BRANDY L

## 2019-02-13 ENCOUNTER — Other Ambulatory Visit: Payer: Self-pay | Admitting: Internal Medicine

## 2019-02-13 ENCOUNTER — Ambulatory Visit (INDEPENDENT_AMBULATORY_CARE_PROVIDER_SITE_OTHER): Payer: Medicare Other | Admitting: Psychology

## 2019-02-13 DIAGNOSIS — F4321 Adjustment disorder with depressed mood: Secondary | ICD-10-CM | POA: Diagnosis not present

## 2019-02-13 MED ORDER — CIPROFLOXACIN HCL 250 MG PO TABS: 250.0000 mg | ORAL_TABLET | Freq: Two times a day (BID) | ORAL | 0 refills | Status: DC

## 2019-02-14 LAB — CULTURE, URINE COMPREHENSIVE
MICRO NUMBER:: 928778
SPECIMEN QUALITY:: ADEQUATE

## 2019-02-16 ENCOUNTER — Other Ambulatory Visit: Payer: Self-pay | Admitting: Internal Medicine

## 2019-02-16 MED ORDER — CIPROFLOXACIN HCL 250 MG PO TABS: 250.0000 mg | ORAL_TABLET | Freq: Two times a day (BID) | ORAL | 0 refills | Status: DC

## 2019-02-19 ENCOUNTER — Ambulatory Visit (INDEPENDENT_AMBULATORY_CARE_PROVIDER_SITE_OTHER): Payer: Medicare Other | Admitting: Psychology

## 2019-02-19 DIAGNOSIS — F4321 Adjustment disorder with depressed mood: Secondary | ICD-10-CM | POA: Diagnosis not present

## 2019-02-20 ENCOUNTER — Other Ambulatory Visit: Payer: Self-pay | Admitting: Cardiology

## 2019-02-20 ENCOUNTER — Other Ambulatory Visit: Payer: Self-pay | Admitting: Allergy and Immunology

## 2019-02-24 ENCOUNTER — Other Ambulatory Visit: Payer: Self-pay | Admitting: Cardiology

## 2019-02-26 NOTE — Telephone Encounter (Signed)
Rx refill sent to pharmacy. 

## 2019-03-06 ENCOUNTER — Other Ambulatory Visit: Payer: Medicare Other

## 2019-03-06 ENCOUNTER — Ambulatory Visit (INDEPENDENT_AMBULATORY_CARE_PROVIDER_SITE_OTHER): Payer: Medicare Other | Admitting: Internal Medicine

## 2019-03-06 ENCOUNTER — Other Ambulatory Visit: Payer: Self-pay

## 2019-03-06 ENCOUNTER — Encounter: Payer: Self-pay | Admitting: Internal Medicine

## 2019-03-06 VITALS — BP 122/80 | HR 71 | Temp 98.1°F | Resp 16 | Ht 62.0 in | Wt 148.0 lb

## 2019-03-06 DIAGNOSIS — R197 Diarrhea, unspecified: Secondary | ICD-10-CM

## 2019-03-06 DIAGNOSIS — R300 Dysuria: Secondary | ICD-10-CM

## 2019-03-06 DIAGNOSIS — R3915 Urgency of urination: Secondary | ICD-10-CM

## 2019-03-06 HISTORY — DX: Urgency of urination: R39.15

## 2019-03-06 HISTORY — DX: Diarrhea, unspecified: R19.7

## 2019-03-06 MED ORDER — DOXYCYCLINE HYCLATE 100 MG PO TABS: 100.0000 mg | ORAL_TABLET | Freq: Two times a day (BID) | ORAL | 0 refills | Status: DC

## 2019-03-06 NOTE — Progress Notes (Signed)
Subjective:    Patient ID: [name], female    DOB: [birth year], 68 y.o.   MRN: [id number]  HPI The patient is here for an acute visit.  ? UTI:  Her symptoms started months ago.  She has chronic bladder discomfort and urgency.  She states nausea, fatigue. She has urinary frequency, but drinks a lot.   She denies dysuria, urinary frequency, hematuria, abdominal pain, back pain, fever.   She completed Cipro two days ago. This upsets her stomach and causes diarrhea - took it for 7 days.  This was the third UTI - she has seen urology w/o obvious cause.  She is having diarrhea 3/day.  She has had frequent antibiotics for recurrent UTI's.    AZO has not helped in the past.      Medications and allergies reviewed with patient and updated if appropriate.  Patient Active Problem List   Diagnosis Date Noted  . 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 Dyspnea on exertion 08/25/2017  . Chest pain in adult 07/13/2017  . HTN (hypertension) 05/31/2017  . Anemia 05/31/2017  . Leg pain, anterior, left 12/15/2016  . Paresthesia 12/15/2016  . Syncope 10/13/2016  . Migraine headache 10/13/2016  . Asthma 10/13/2016  . Anxiety 10/13/2016  . Hyperlipidemia 10/13/2016  . UTI (urinary tract infection) 10/13/2016  . CKD (chronic kidney disease) stage 3, GFR 30-59 ml/min 10/13/2016  . Chronic depression 10/13/2016  . Hypothyroidism 10/13/2016  . Laryngopharyngeal reflux (LPR) 06/15/2016  . Acute upper respiratory infection 05/05/2016  . Degenerative disc disease, cervical 11/29/2015  . IBS (irritable bowel syndrome) 11/24/2015  . Facet arthritis of cervical region 10/16/2015  . Chronic urinary tract infection 07/22/2015  . Malaise and fatigue 04/16/2015  . Severe episode of recurrent major depressive disorder, without psychotic features ([place]) 12/18/2014  . Post concussion syndrome 10/28/2014  . 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 Depression 10/25/2014  . Major  depressive disorder, recurrent, severe without psychotic features ([place])   . 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 Concussion w/o coma 09/18/2014  . Irritable bowel syndrome with diarrhea 09/12/2014  . 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 Cold intolerance 03/26/2014  . Cough variant asthma  vs UACS/ irritable larynx  03/26/2014  . Abdominal pain, generalized 03/26/2014  . Positive skin test 03/26/2014  . Adjustment disorder with anxiety 02/07/2014  . Cervical pain 12/05/2013  . Fibromyalgia 12/05/2013  . Cephalalgia 12/05/2013  . Adult hypothyroidism 12/05/2013  . Adaptive colitis 12/05/2013  . Menopause 12/05/2013  . Mild cognitive disorder 12/05/2013  . Feeling bilious 12/05/2013  . Leiomyosarcoma ([place]) 09/10/2013  . Lung mass 09/10/2013  . Lung nodule, solitary 09/10/2013  . Abnormal chest x-ray   . Unspecified hypothyroidism 04/25/2013  . Vitamin D deficiency 04/25/2013  . 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 Female genuine stress incontinence 04/12/2012  . Healthcare maintenance 10/30/2010  . MUSCLE PAIN 07/03/2008  . BIPLR I MOST RECENT EPIS DPRS SEV NO PSYCHOT B[facility] 02/14/2008  . DEEP PHLEBOTHROMBOSIS PP UNSPEC AS EPIS CARE 02/14/2008  . Bipolar affective disorder, current episode depressed ([place]) 02/14/2008  . Deep phlebothrombosis, postpartum 02/14/2008  . HYPERLIPIDEMIA 03/04/2007  . ALLERGIC RHINITIS 03/04/2007  . GERD 03/04/2007  . IRRITABLE BOWEL SYNDROME, HX OF 03/04/2007    Current Outpatient Medications on File Prior to Visit  Medication Sig Dispense Refill  . ALPRAZolam (XANAX) 0.5 MG tablet Take 1 tablet (0.5 mg total) by mouth  2 (two) times daily as needed for anxiety. 60 tablet 2  . amLODipine (NORVASC) 5 MG tablet Take 5 mg by mouth daily.    Marland Kitchen aspirin EC 81 MG tablet Take 81 mg by mouth daily.    . busPIRone (BUSPAR) 10 MG tablet Take 2 tablets (20 mg total) by mouth 2 (two) times daily.  (Patient taking differently: Take 30 mg by mouth 2 (two) times daily. ) 360 tablet 1  . Calcium Carb-Cholecalciferol (CALCIUM + D3) 600-800 MG-UNIT TABS Take 1 tablet by mouth daily.    . candesartan (ATACAND) 16 MG tablet Take 0.5 tablets (8 mg total) by mouth daily. 90 tablet 1  . carvedilol (COREG) 12.5 MG tablet Take 1 tablet (12.5 mg total) by mouth 2 (two) times daily with a meal. 180 tablet 1  . Cholecalciferol (D3-1000) 1000 units capsule Take 1,000 Units by mouth daily.    . Cyanocobalamin (VITAMIN B-12) 1000 MCG SUBL Place 1 tablet (1,000 mcg total) under the tongue daily. (Patient taking differently: Place 2,000 mcg under the tongue daily. ) 100 tablet 3  . cyclobenzaprine (FLEXERIL) 10 MG tablet Take 10 mg by mouth 2 (two) times daily as needed for muscle spasms.     . D-Mannose 500 MG CAPS Take 500 mg by mouth daily. WITH CRANBERRY & DANDELION EXTRACT    . esomeprazole (NEXIUM) 40 MG capsule TAKE 1 CAPSULE BY MOUTH EVERY MORNING 30 capsule 0  . estradiol (ESTRACE) 1 MG tablet Take 1 tablet (1 mg total) by mouth daily. 90 tablet 3  . famotidine (PEPCID) 40 MG tablet TAKE 1 TABLET BY MOUTH EVERY DAY IN THE EVENING 30 tablet 0  . FLUoxetine (PROZAC) 40 MG capsule Take 1 capsule (40 mg total) by mouth every morning. 90 capsule 1  . hydrochlorothiazide (MICROZIDE) 12.5 MG capsule TAKE 1 CAPSULE BY MOUTH EVERY DAY 90 capsule 1  . iron polysaccharides (FERREX 150) 150 MG capsule Take 1 capsule (150 mg total) by mouth 2 (two) times daily. 180 capsule 3  . Lactobacillus ([employer]S PROBIOTIC ACIDOPHILUS PO) Take 1 capsule by mouth daily.    Marland Kitchen levothyroxine (SYNTHROID, LEVOTHROID) 100 MCG tablet Take 1 tablet (100 mcg total) by mouth daily before breakfast. 90 tablet 2  . meclizine (ANTIVERT) 25 MG tablet Take 1 tablet (25 mg total) by mouth 3 (three) times daily as needed for dizziness. 30 tablet 0  . mirtazapine (REMERON) 30 MG tablet Take 1 tablet (30 mg total) by mouth at bedtime. 90 tablet 1  .  montelukast (SINGULAIR) 10 MG tablet TAKE 1 TABLET BY MOUTH EVERY DAY 90 tablet 1  . Multiple Vitamins-Minerals (MULTIVITAMIN WITH MINERALS) tablet Take 1 tablet by mouth daily.    . ondansetron (ZOFRAN-ODT) 4 MG disintegrating tablet Take 4 mg by mouth every 8 (eight) hours as needed for nausea or vomiting.   0  . Polyethyl Glyc-Propyl Glyc PF (SYSTANE PRESERVATIVE FREE) 0.4-0.3 % SOLN Apply 2 drops to eye 2 (two) times a day.     . progesterone (PROMETRIUM) 100 MG capsule Take 100 mg by mouth at bedtime.    . SUMAtriptan (IMITREX) 100 MG tablet TAKE 1/2-1 TABLET BY MOUTH EVERY 2 HOURS AS NEEDED FOR HEADACHE 12 tablet 5  . SUMAtriptan (IMITREX) 6 MG/0.5ML SOLN injection Inject 6 mg into the skin every 2 (two) hours as needed for migraine or headache. Reported on 08/11/2015    . traMADol (ULTRAM) 50 MG tablet Take 50 mg by mouth every 6 (six) hours as needed (for  pain).     . triamcinolone (NASACORT ALLERGY 24HR) 55 MCG/ACT AERO nasal inhaler Use 1 spray in each nostril once daily (Patient taking differently: Place 1 spray into the nose daily. ) 1 Inhaler 5   No current facility-administered medications on file prior to visit.     Past Medical History:  Diagnosis Date  . Abdominal pain, generalized 03/26/2014  . Acute upper respiratory infection 05/05/2016   12/17  . Adaptive colitis 12/05/2013  . Adult hypothyroidism 12/05/2013   On Levothroid  . Allergic rhinitis, cause unspecified   . Anemia 05/31/2017   Iron def  . Anxiety   . Asthma    chronic cough  . Bipolar I disorder, most recent episode (or current) depressed, severe, without mention of psychotic behavior   . Blood transfusion without reported diagnosis   . 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 Cephalalgia 12/05/2013  . 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 Chest pain, atypical 07/13/2017   2/19 CT abd/chest - ok in June 2018  . Chronic depression 10/13/2016  . Cold intolerance 03/26/2014  . Concussion w/o coma 09/18/2014   . Cough variant asthma  vs UACS/ irritable larynx  03/26/2014   FENO 08/11/2016  =   17 p no rx    . Deep phlebothrombosis, postpartum 02/14/2008   Overview:  Overview:  Annotation: affected mostly left leg. Qualifier: Diagnosis of  By: Linda Hedges MD, Heinz Knuckles   . Deep phlebothrombosis, postpartum, unspecified as to episode of care(671.40)   . Depression   . Feeling bilious 12/05/2013  . Female genuine stress incontinence 04/12/2012  . Fibromyalgia   . Gastritis   . GERD 03/04/2007   Protonix   . GERD (gastroesophageal reflux disease)   . HTN (hypertension) 05/31/2017   1/19 Verapamil bid 2/19 added Maxzide  . Hyperlipidemia   . IBS (irritable bowel syndrome)   . 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 Irritable bowel syndrome with diarrhea 09/12/2014   Dr Olevia Perches  . IRRITABLE BOWEL SYNDROME, HX OF 03/04/2007   Qualifier: Diagnosis of  By: Quentin Cornwall CMA, Janett Billow    . Laryngopharyngeal reflux (LPR) 06/15/2016  . Leg pain, anterior, left 12/15/2016   8/18 2/19 L  . Leiomyosarcoma ([place]) 09/10/2013   Vaginal wall - rare tumor 12/12 Dr Matthew Saras S/p chemo  . 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 Lung mass 09/10/2013  . Lung nodule, solitary 09/10/2013  . Malaise and fatigue 04/16/2015  . Menopause 12/05/2013  . Migraine headache   . Mild cognitive disorder 12/05/2013  . MUSCLE PAIN 07/03/2008   Qualifier: Diagnosis of  By: Jenny Reichmann MD, Hunt Oris   . Other and unspecified hyperlipidemia   . Parathyroid adenoma 2014  . Paresthesia 12/15/2016   8/18 L lat foot - ?peroneal nerve damage  . Positive skin test 03/26/2014  . Post concussion syndrome 10/28/2014  . Severe episode of recurrent major depressive disorder, without psychotic features ([place]) 12/18/2014   On Fluoxetine   . Status post chemotherapy 2014  . Syncope 10/13/2016  . Unspecified hypothyroidism 04/25/2013  . Vitamin D deficiency 04/25/2013    Past Surgical History:  Procedure Laterality Date  . ANTERIOR FUSION CERVICAL SPINE   2006  . BLADDER SURGERY  06/2016   [place], piece of mesh with necrotic tissue removed  . CATARACT EXTRACTION, BILATERAL  2019  . CESAREAN SECTION  1986  . CHOLECYSTECTOMY N/A 05/03/2013   Procedure: LAPAROSCOPIC CHOLECYSTECTOMY WITH INTRAOPERATIVE CHOLANGIOGRAM;  Surgeon: Odis Hollingshead, MD;  Location: [facility] OR;  Service: General;  Laterality: N/A;  . COLONOSCOPY    . herniated discectomy  2019  . laprotomy  04/2012   for cancer  . NECK SURGERY  2006   C spine fusion  . POSTERIOR FUSION CERVICAL SPINE  2016  . PUBOVAGINAL SLING    . RIGHT/LEFT HEART CATH AND CORONARY ANGIOGRAPHY N/A 08/26/2017   Procedure: RIGHT/LEFT HEART CATH AND CORONARY ANGIOGRAPHY;  Surgeon: Burnell Blanks, MD;  Location: [facility];  Service: Cardiovascular;  Laterality: N/A;  . VAGINAL HYSTERECTOMY      Social History   Socioeconomic History  . Marital status: Divorced    Spouse name: Not on file  . Number of children: 2  . Years of education: 73  . Highest education level: Not on file  Occupational History  . 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 Financial resource strain: Not hard at all  . Food insecurity    Worry: Never true    Inability: Never true  . Transportation needs    Medical: No    Non-medical: No  Tobacco Use  . Smoking status: Never Smoker  . Smokeless tobacco: Never Used  Substance and Sexual Activity  . Alcohol use: Yes    Alcohol/week: 2.0 - 3.0 standard drinks    Types: 2 - 3 Standard drinks or equivalent per week    Comment: rarely  . Drug use: No  . Sexual activity: Not Currently    Partners: Male  Lifestyle  . Physical activity    Days per week: 5 days    Minutes per session: 40 min  . Stress: Not at all  Relationships  . Social connections    Talks on phone: More than three times a week    Gets together: More than three times a week    Attends religious service: More than 4 times per year    Active  member of club or organization: Yes    Attends meetings of clubs or organizations: More than 4 times per year    Relationship status: Divorced  Other Topics Concern  . Not on file  Social History Narrative   Married 4 years- divorced; married 10 years- divorced. Serially monogamous relationships. Remarried March '11. 2 sons- '82, '87.    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 (June "12).    Lives in her own home.  Currently going through a divorce.    Family History  Problem Relation Age of Onset  . Coronary artery disease Father   . Heart disease Father   . Hypertension Father   . Irritable bowel syndrome Father   . Arthritis Mother   . Hypertension Mother   . Migraines Mother   . Heart disease Mother   . Diabetes Maternal Grandmother   . Allergies Maternal Grandmother   . Irritable bowel syndrome Sister   . Irritable bowel syndrome Brother   . Colon cancer Neg Hx   . Esophageal cancer Neg Hx   . Rectal cancer Neg Hx   . Stomach cancer Neg Hx     Review of Systems  Constitutional: Negative for chills and fever.  Gastrointestinal: Positive for diarrhea (3/day) and nausea. Negative for abdominal pain and blood in stool.  Genitourinary: Negative for dysuria, hematuria and urgency.       Bladder discomfort, no abn urine color or odor  Objective:   Vitals:   03/06/19 1438  BP: 122/80  Pulse: 71  Resp: 16  Temp: 98.1 F (36.7 C)  SpO2: 98%   BP Readings from Last 3 Encounters:  03/06/19 122/80  01/19/19 112/78  10/18/18 120/72   Wt Readings from Last 3 Encounters:  03/06/19 148 lb (67.1 kg)  01/19/19 149 lb 8 oz (67.8 kg)  10/18/18 150 lb (68 kg)   Body mass index is 27.07 kg/m.   Physical Exam Constitutional:      General: She is not in acute distress.    Appearance: Normal appearance. She is not ill-appearing.  HENT:     Head: Normocephalic and atraumatic.  Abdominal:      General: There is no distension.     Tenderness: There is no abdominal tenderness. There is no right CVA tenderness or left CVA tenderness.  Musculoskeletal:        General: No tenderness (lower back).  Skin:    General: Skin is warm and dry.  Neurological:     Mental Status: She is alert.            Assessment & Plan:    See Problem List for Assessment and Plan of chronic medical problems.

## 2019-03-06 NOTE — Assessment & Plan Note (Signed)
recent UTI - completed cipro but no improvement in symptoms Reviewed culture - cipro should have worked She has chronic symptoms and recurrent UTI - following with urology, gyn ? Interstitial cystitis with recurrent uti Will start doxycycline - unable to give urine sample - will try to submit one Advised f/u with urology

## 2019-03-06 NOTE — Patient Instructions (Signed)
Start doxycycline twice daily for one week.  We will call with the results of  Your culture.    Give stool sample to rule out Cdiff.   Follow up with urology.

## 2019-03-06 NOTE — Assessment & Plan Note (Addendum)
Having diarrhea 3 loose stools / day - side effect from cipro vs cdiff which seems less likely Will check stool studies to be on the safe side given she has taken several antibiotics Taking probiotics

## 2019-03-07 ENCOUNTER — Ambulatory Visit: Payer: Medicare Other | Admitting: Internal Medicine

## 2019-03-07 ENCOUNTER — Ambulatory Visit (INDEPENDENT_AMBULATORY_CARE_PROVIDER_SITE_OTHER): Payer: Medicare Other | Admitting: Psychology

## 2019-03-07 DIAGNOSIS — F4321 Adjustment disorder with depressed mood: Secondary | ICD-10-CM

## 2019-03-08 LAB — URINE CULTURE
MICRO NUMBER:: 1009227
SPECIMEN QUALITY:: ADEQUATE

## 2019-03-14 ENCOUNTER — Ambulatory Visit: Payer: Self-pay | Admitting: Psychology

## 2019-03-19 DIAGNOSIS — N3000 Acute cystitis without hematuria: Secondary | ICD-10-CM | POA: Diagnosis not present

## 2019-03-20 ENCOUNTER — Other Ambulatory Visit: Payer: Medicare Other

## 2019-03-20 DIAGNOSIS — R197 Diarrhea, unspecified: Secondary | ICD-10-CM

## 2019-03-22 ENCOUNTER — Encounter: Payer: Self-pay | Admitting: Internal Medicine

## 2019-03-22 LAB — CLOSTRIDIUM DIFFICILE EIA: C difficile Toxins A+B, EIA: NEGATIVE

## 2019-03-23 ENCOUNTER — Ambulatory Visit (INDEPENDENT_AMBULATORY_CARE_PROVIDER_SITE_OTHER): Payer: Medicare Other | Admitting: Psychology

## 2019-03-23 ENCOUNTER — Encounter: Payer: Self-pay | Admitting: Internal Medicine

## 2019-03-23 DIAGNOSIS — F4321 Adjustment disorder with depressed mood: Secondary | ICD-10-CM

## 2019-03-24 LAB — STOOL CULTURE
MICRO NUMBER:: 1059130
MICRO NUMBER:: 1059131
MICRO NUMBER:: 1059132
SHIGA RESULT:: NOT DETECTED
SPECIMEN QUALITY:: ADEQUATE
SPECIMEN QUALITY:: ADEQUATE
SPECIMEN QUALITY:: ADEQUATE

## 2019-03-25 ENCOUNTER — Encounter: Payer: Self-pay | Admitting: Internal Medicine

## 2019-04-02 DIAGNOSIS — R300 Dysuria: Secondary | ICD-10-CM | POA: Diagnosis not present

## 2019-04-03 ENCOUNTER — Other Ambulatory Visit: Payer: Self-pay | Admitting: Allergy and Immunology

## 2019-04-18 ENCOUNTER — Ambulatory Visit (INDEPENDENT_AMBULATORY_CARE_PROVIDER_SITE_OTHER): Payer: Medicare Other | Admitting: Psychology

## 2019-04-18 DIAGNOSIS — F4321 Adjustment disorder with depressed mood: Secondary | ICD-10-CM | POA: Diagnosis not present

## 2019-04-22 ENCOUNTER — Other Ambulatory Visit: Payer: Self-pay | Admitting: Allergy and Immunology

## 2019-04-25 ENCOUNTER — Ambulatory Visit: Payer: Medicare Other | Admitting: Psychology

## 2019-04-25 ENCOUNTER — Ambulatory Visit: Payer: Medicare Other | Admitting: Internal Medicine

## 2019-04-25 ENCOUNTER — Encounter: Payer: Self-pay | Admitting: Internal Medicine

## 2019-04-25 ENCOUNTER — Ambulatory Visit (INDEPENDENT_AMBULATORY_CARE_PROVIDER_SITE_OTHER): Payer: Medicare Other | Admitting: Internal Medicine

## 2019-04-25 DIAGNOSIS — Z20828 Contact with and (suspected) exposure to other viral communicable diseases: Secondary | ICD-10-CM

## 2019-04-25 DIAGNOSIS — Z20822 Contact with and (suspected) exposure to covid-19: Secondary | ICD-10-CM | POA: Insufficient documentation

## 2019-04-25 DIAGNOSIS — N3000 Acute cystitis without hematuria: Secondary | ICD-10-CM | POA: Diagnosis not present

## 2019-04-25 HISTORY — DX: Contact with and (suspected) exposure to covid-19: Z20.822

## 2019-04-25 MED ORDER — ONDANSETRON 4 MG PO TBDP: 4.0000 mg | ORAL_TABLET | Freq: Three times a day (TID) | ORAL | 0 refills | Status: DC | PRN

## 2019-04-25 MED ORDER — SULFAMETHOXAZOLE-TRIMETHOPRIM 800-160 MG PO TABS: 1.0000 | ORAL_TABLET | Freq: Two times a day (BID) | ORAL | 0 refills | Status: DC

## 2019-04-25 NOTE — Assessment & Plan Note (Signed)
Checking for covid-19 and rx zofran.

## 2019-04-25 NOTE — Progress Notes (Signed)
Virtual Visit via Video Note  I connected with [name] on 04/25/19 at  9:40 AM EST by a video enabled telemedicine application and verified that I am speaking with the correct person using two identifiers.  The patient and the provider were at separate locations throughout the entire encounter.   I discussed the limitations of evaluation and management by telemedicine and the availability of in person appointments. The patient expressed understanding and agreed to proceed. The patient and the provider were the only parties present for the visit unless noted in HPI below.  History of Present Illness: The patient is a 68 y.o. female with visit for several concerns. Having UTI symptoms (has chronic infections and takes macrobid daily for prevention, feels like she is getting a flare in the last week or so, having burning and pain with going, denies back pain, having nausea in the last day and diarrhea, denies abdominal pain) and new covid-19 symptoms (in the last 1-2 days having cough, diarrhea, nausea, fatigue, muscle aches, denies sick contacts, has not tried anything, is worsening, tried imodium for the diarrhea and has had 2 episodes this morning already).   Observations/Objective: Appearance: normal, breathing appears normal, casual grooming, abdomen does not appear distended, throat not visualized, memory normal, mental status is A and O times 3  Assessment and Plan: See problem oriented charting  Follow Up Instructions: covid-19, bactrim rx and zofran rx  Visit time 25 minutes: greater than 50% of that time was spent in face to face counseling and coordination of care with the patient: counseled about as above and covid-19  I discussed the assessment and treatment plan with the patient. The patient was provided an opportunity to ask questions and all were answered. The patient agreed with the plan and demonstrated an understanding of the instructions.   The patient was advised to  call back or seek an in-person evaluation if the symptoms worsen or if the condition fails to improve as anticipated.  Hoyt Koch, MD

## 2019-04-25 NOTE — Assessment & Plan Note (Signed)
Rx bactrim 1 week for likely infection.

## 2019-04-30 ENCOUNTER — Ambulatory Visit (INDEPENDENT_AMBULATORY_CARE_PROVIDER_SITE_OTHER): Payer: Medicare Other | Admitting: Psychology

## 2019-04-30 DIAGNOSIS — F4321 Adjustment disorder with depressed mood: Secondary | ICD-10-CM

## 2019-05-16 ENCOUNTER — Telehealth: Payer: Self-pay

## 2019-05-16 NOTE — Telephone Encounter (Signed)
Copied from [place] [id number]. Topic: General - Other >> May 16, 2019  9:56 AM Mcneil, Ja-Kwan wrote: Reason for CRM: Pt stated she possibly has an UTI and she would like an order so she can provide a sample. Pt requests call back.

## 2019-05-16 NOTE — Telephone Encounter (Signed)
Pt needs OV, please call pt to schedule

## 2019-05-17 ENCOUNTER — Other Ambulatory Visit: Payer: Self-pay

## 2019-05-17 ENCOUNTER — Ambulatory Visit (INDEPENDENT_AMBULATORY_CARE_PROVIDER_SITE_OTHER): Payer: Medicare Other | Admitting: Internal Medicine

## 2019-05-17 ENCOUNTER — Encounter: Payer: Self-pay | Admitting: Internal Medicine

## 2019-05-17 ENCOUNTER — Ambulatory Visit: Payer: Medicare Other | Admitting: Internal Medicine

## 2019-05-17 VITALS — BP 124/82 | HR 73 | Temp 98.1°F | Ht 62.0 in | Wt 158.0 lb

## 2019-05-17 DIAGNOSIS — R300 Dysuria: Secondary | ICD-10-CM | POA: Insufficient documentation

## 2019-05-17 DIAGNOSIS — R399 Unspecified symptoms and signs involving the genitourinary system: Secondary | ICD-10-CM

## 2019-05-17 HISTORY — DX: Dysuria: R30.0

## 2019-05-17 LAB — POCT URINALYSIS DIPSTICK
Bilirubin, UA: NEGATIVE
Glucose, UA: NEGATIVE
Ketones, UA: NEGATIVE
Nitrite, UA: NEGATIVE
Protein, UA: NEGATIVE
Spec Grav, UA: 1.015 (ref 1.010–1.025)
Urobilinogen, UA: 0.2 E.U./dL
pH, UA: 6 (ref 5.0–8.0)

## 2019-05-17 MED ORDER — SULFAMETHOXAZOLE-TRIMETHOPRIM 800-160 MG PO TABS: 1.0000 | ORAL_TABLET | Freq: Two times a day (BID) | ORAL | 0 refills | Status: DC

## 2019-05-17 NOTE — Assessment & Plan Note (Signed)
Taking nitrofurantoin daily for prevention, having current symptoms and U/A in the office with signs of infection. Will add bactrim for 5 days then resume preventative. Sending for culture.

## 2019-05-17 NOTE — Progress Notes (Signed)
   Subjective:   Patient ID: [name], female    DOB: 12/06/1950, 68 y.o.   MRN: [id number]  HPI The patient is a 68 YO female coming in for concerns about UTI. She was initially scheduled an in-person visit and urine collected and vitals done. She then admitted that she was having nausea, headache new in the last 3 days. She was asked to leave as these symptoms are not allowed in the office due to covid-19 outbreak. The rest of information was collected verbally.   Review of Systems  Constitutional: Negative.   Respiratory: Negative.   Cardiovascular: Negative.   Gastrointestinal: Positive for nausea. Negative for abdominal distention, abdominal pain, constipation, diarrhea and vomiting.  Genitourinary: Positive for dysuria, frequency and urgency.  Musculoskeletal: Negative.   Skin: Negative.   Neurological: Positive for headaches.    Objective:  Physical Exam Vitals reviewed: not done due to covid-19 symptoms which were not disclosed until after visit had started.     Vitals:   05/17/19 1159  BP: 124/82  Pulse: 73  Temp: 98.1 F (36.7 C)  TempSrc: Oral  SpO2: 97%  Weight: 158 lb (71.7 kg)  Height: 5\' 2"  (1.575 m)    This visit occurred during the SARS-CoV-2 public health emergency.  Safety protocols were in place, including screening questions prior to the visit, additional usage of staff PPE, and extensive cleaning of exam room while observing appropriate contact time as indicated for disinfecting solutions.   Assessment & Plan:  Visit time 15 minutes: greater than 50% of that time was spent in non-face to face counseling and coordination of care with the patient: counseled about covid-19 symptoms and to honestly answer screening questions in the future for the safety of all, counseling with patient about her results and treatment plan

## 2019-05-19 LAB — URINE CULTURE

## 2019-05-20 ENCOUNTER — Encounter: Payer: Self-pay | Admitting: Internal Medicine

## 2019-05-23 ENCOUNTER — Ambulatory Visit: Payer: Medicare Other | Attending: Internal Medicine

## 2019-05-23 DIAGNOSIS — Z20822 Contact with and (suspected) exposure to covid-19: Secondary | ICD-10-CM

## 2019-05-24 LAB — NOVEL CORONAVIRUS, NAA: SARS-CoV-2, NAA: NOT DETECTED

## 2019-05-27 ENCOUNTER — Ambulatory Visit
Admission: EM | Admit: 2019-05-27 | Discharge: 2019-05-27 | Disposition: A | Discharge status: Home / Self Care | Payer: Medicare Other

## 2019-05-27 ENCOUNTER — Other Ambulatory Visit: Payer: Self-pay

## 2019-05-27 DIAGNOSIS — J209 Acute bronchitis, unspecified: Secondary | ICD-10-CM

## 2019-05-27 DIAGNOSIS — J0140 Acute pansinusitis, unspecified: Secondary | ICD-10-CM

## 2019-05-27 DIAGNOSIS — B9689 Other specified bacterial agents as the cause of diseases classified elsewhere: Secondary | ICD-10-CM

## 2019-05-27 MED ORDER — DOXYCYCLINE HYCLATE 100 MG PO CAPS: 100.0000 mg | ORAL_CAPSULE | Freq: Two times a day (BID) | ORAL | 0 refills | Status: DC

## 2019-05-27 MED ORDER — ALBUTEROL SULFATE HFA 108 (90 BASE) MCG/ACT IN AERS: 1.0000 | INHALATION_SPRAY | Freq: Four times a day (QID) | RESPIRATORY_TRACT | 0 refills | Status: DC | PRN

## 2019-05-27 MED ORDER — PREDNISONE 50 MG PO TABS: 50.0000 mg | ORAL_TABLET | Freq: Every day | ORAL | 0 refills | Status: DC

## 2019-05-27 MED ORDER — IPRATROPIUM-ALBUTEROL 0.5-2.5 (3) MG/3ML IN SOLN: 3.0000 mL | Freq: Four times a day (QID) | RESPIRATORY_TRACT | 0 refills | Status: DC | PRN

## 2019-05-27 MED ORDER — ALBUTEROL SULFATE (2.5 MG/3ML) 0.083% IN NEBU: 2.5000 mg | INHALATION_SOLUTION | Freq: Four times a day (QID) | RESPIRATORY_TRACT | 0 refills | Status: DC | PRN

## 2019-05-27 NOTE — Discharge Instructions (Signed)
Start doxycycline and prednisone as directed. Albuterol inhaler/nebulizer as needed. Duoneb as needed every 4-6 hours (2 doses) today if needed. Over the counter flonase as needed. You can use over the counter nasal saline rinse such as neti pot for nasal congestion. Keep hydrated, your urine should be clear to pale yellow in color. Tylenol/motrin for fever and pain. Monitor for any worsening of symptoms, chest pain, shortness of breath, wheezing, swelling of the throat, go to the emergency department for further evaluation needed.

## 2019-05-27 NOTE — ED Triage Notes (Signed)
Patient presents with cough x 2 weeks and PND, chest congestion and watery eyes. Negative covid test on Wednesday.

## 2019-05-27 NOTE — ED Provider Notes (Signed)
[facility]    CSN: [id number] Arrival date & time: 05/27/19  1021      History   Chief Complaint Chief Complaint  Patient presents with  . Cough    HPI BRAZIL VOYTKO Luetta Nutting is a 69 y.o. female.   69 year old female comes in for 2 week history of URI symptoms. Cough, post nasal drip, rhinorrhea. chest congestion. No sinus pressure. Dyspnea on exertion. Denies fever, chills, body aches. Denies abdominal pain, nausea, vomiting, diarrhea. Loss of taste/smell. Negative PCR COVID 5 days ago. Never smoker.      Past Medical History:  Diagnosis Date  . Abdominal pain, generalized 03/26/2014  . Acute upper respiratory infection 05/05/2016   12/17  . Adaptive colitis 12/05/2013  . Adult hypothyroidism 12/05/2013   On Levothroid  . Allergic rhinitis, cause unspecified   . Anemia 05/31/2017   Iron def  . Anxiety   . Asthma    chronic cough  . Bipolar I disorder, most recent episode (or current) depressed, severe, without mention of psychotic behavior   . Blood transfusion without reported diagnosis   . 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 Cephalalgia 12/05/2013  . 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 Chest pain, atypical 07/13/2017   2/19 CT abd/chest - ok in June 2018  . Chronic depression 10/13/2016  . Cold intolerance 03/26/2014  . Concussion w/o coma 09/18/2014  . Cough variant asthma  vs UACS/ irritable larynx  03/26/2014   FENO 08/11/2016  =   17 p no rx    . Deep phlebothrombosis, postpartum 02/14/2008   Overview:  Overview:  Annotation: affected mostly left leg. Qualifier: Diagnosis of  By: Linda Hedges MD, Heinz Knuckles   . Deep phlebothrombosis, postpartum, unspecified as to episode of care(671.40)   . Depression   . Feeling bilious 12/05/2013  . Female genuine stress incontinence 04/12/2012  . Fibromyalgia   . Gastritis   . GERD 03/04/2007   Protonix   . GERD (gastroesophageal reflux disease)   . HTN (hypertension) 05/31/2017   1/19 Verapamil bid 2/19  added Maxzide  . Hyperlipidemia   . IBS (irritable bowel syndrome)   . 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 Irritable bowel syndrome with diarrhea 09/12/2014   Dr Olevia Perches  . IRRITABLE BOWEL SYNDROME, HX OF 03/04/2007   Qualifier: Diagnosis of  By: Quentin Cornwall CMA, Janett Billow    . Laryngopharyngeal reflux (LPR) 06/15/2016  . Leg pain, anterior, left 12/15/2016   8/18 2/19 L  . 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 Lung mass 09/10/2013  . Lung nodule, solitary 09/10/2013  . Malaise and fatigue 04/16/2015  . Menopause 12/05/2013  . Migraine headache   . Mild cognitive disorder 12/05/2013  . MUSCLE PAIN 07/03/2008   Qualifier: Diagnosis of  By: Jenny Reichmann MD, Hunt Oris   . Other and unspecified hyperlipidemia   . Parathyroid adenoma 2014  . Paresthesia 12/15/2016   8/18 L lat foot - ?peroneal nerve damage  . Positive skin test 03/26/2014  . Post concussion syndrome 10/28/2014  . Severe episode of recurrent major depressive disorder, without psychotic features ([place]) 12/18/2014   On Fluoxetine   . Status post chemotherapy 2014  . Syncope 10/13/2016  . Unspecified hypothyroidism 04/25/2013  . Vitamin D deficiency 04/25/2013    Patient Active Problem List   Diagnosis Date Noted  . Dysuria 05/17/2019  . Suspected  2019 novel coronavirus infection 04/25/2019  . Urinary urgency 03/06/2019  . Diarrhea 03/06/2019  . 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 Dyspnea on exertion 08/25/2017  . Chest pain in adult 07/13/2017  . HTN (hypertension) 05/31/2017  . Anemia 05/31/2017  . Leg pain, anterior, left 12/15/2016  . Paresthesia 12/15/2016  . Syncope 10/13/2016  . Migraine headache 10/13/2016  . Asthma 10/13/2016  . Anxiety 10/13/2016  . Hyperlipidemia 10/13/2016  . UTI (urinary tract infection) 10/13/2016  . CKD (chronic kidney disease) stage 3, GFR 30-59 ml/min 10/13/2016  . Chronic  depression 10/13/2016  . Hypothyroidism 10/13/2016  . Laryngopharyngeal reflux (LPR) 06/15/2016  . Acute upper respiratory infection 05/05/2016  . Degenerative disc disease, cervical 11/29/2015  . IBS (irritable bowel syndrome) 11/24/2015  . Facet arthritis of cervical region 10/16/2015  . Chronic urinary tract infection 07/22/2015  . Malaise and fatigue 04/16/2015  . Severe episode of recurrent major depressive disorder, without psychotic features ([place]) 12/18/2014  . Post concussion syndrome 10/28/2014  . 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 Depression 10/25/2014  . Major depressive disorder, recurrent, severe without psychotic features ([place])   . 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 Concussion w/o coma 09/18/2014  . Irritable bowel syndrome with diarrhea 09/12/2014  . 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 Cold intolerance 03/26/2014  . Cough variant asthma  vs UACS/ irritable larynx  03/26/2014  . Abdominal pain, generalized 03/26/2014  . Positive skin test 03/26/2014  . Adjustment disorder with anxiety 02/07/2014  . Cervical pain 12/05/2013  . Fibromyalgia 12/05/2013  . Cephalalgia 12/05/2013  . Adult hypothyroidism 12/05/2013  . Adaptive colitis 12/05/2013  . Menopause 12/05/2013  . Mild cognitive disorder 12/05/2013  . Feeling bilious 12/05/2013  . 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 Lung mass 09/10/2013  . Lung nodule, solitary 09/10/2013  . Abnormal chest x-ray   . Unspecified hypothyroidism 04/25/2013  . Vitamin D deficiency 04/25/2013  . 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 Female genuine stress incontinence 04/12/2012  . Healthcare maintenance 10/30/2010  . MUSCLE PAIN 07/03/2008  . BIPLR I MOST RECENT EPIS DPRS SEV NO PSYCHOT BHV 02/14/2008  . DEEP PHLEBOTHROMBOSIS PP UNSPEC AS EPIS CARE 02/14/2008  . Bipolar affective disorder, current episode depressed ([place]) 02/14/2008  . Deep  phlebothrombosis, postpartum 02/14/2008  . HYPERLIPIDEMIA 03/04/2007  . ALLERGIC RHINITIS 03/04/2007  . GERD 03/04/2007  . IRRITABLE BOWEL SYNDROME, HX OF 03/04/2007    Past Surgical History:  Procedure Laterality Date  . ANTERIOR FUSION CERVICAL SPINE  2006  . BLADDER SURGERY  06/2016   [facility], piece of mesh with necrotic tissue removed  . CATARACT EXTRACTION, BILATERAL  2019  . CESAREAN SECTION  1986  . CHOLECYSTECTOMY N/A 05/03/2013   Procedure: LAPAROSCOPIC CHOLECYSTECTOMY WITH INTRAOPERATIVE CHOLANGIOGRAM;  Surgeon: Odis Hollingshead, MD;  Location: [facility];  Service: General;  Laterality: N/A;  . COLONOSCOPY    . herniated discectomy  2019  . laprotomy  04/2012   for cancer  . NECK SURGERY  2006   C spine fusion  . POSTERIOR FUSION CERVICAL SPINE  2016  . PUBOVAGINAL SLING    . RIGHT/LEFT HEART CATH AND CORONARY ANGIOGRAPHY N/A 08/26/2017   Procedure: RIGHT/LEFT HEART CATH AND CORONARY ANGIOGRAPHY;  Surgeon: Burnell Blanks, MD;  Location: [place] [facility];  Service: Cardiovascular;  Laterality: N/A;  . VAGINAL HYSTERECTOMY      OB History   No obstetric history on file.  Home Medications    Prior to Admission medications   Medication Sig Start Date End Date Taking? Authorizing Provider  b complex vitamins tablet Take 1 tablet by mouth daily.   Yes [provider]  Omega-3 Fatty Acids (FISH OIL) 1200 MG CAPS Take 1 capsule by mouth.   Yes [provider]  ondansetron (ZOFRAN) 4 MG tablet Take 4 mg by mouth every 8 (eight) hours as needed for nausea or vomiting.   Yes [provider]  SUMAtriptan (IMITREX) 100 MG tablet Take 100 mg by mouth every 2 (two) hours as needed for migraine. May repeat in 2 hours if headache persists or recurs.   Yes [provider]  albuterol (PROVENTIL) (2.5 MG/3ML) 0.083% nebulizer solution Take 3 mLs (2.5 mg total) by nebulization every 6 (six) hours as needed for wheezing or shortness of  breath. 05/27/19   Tasia Catchings, Dalynn Jhaveri V, PA-C  albuterol (VENTOLIN HFA) 108 (90 Base) MCG/ACT inhaler Inhale 1-2 puffs into the lungs every 6 (six) hours as needed for wheezing or shortness of breath. 05/27/19   Tasia Catchings, Shonteria Abeln V, PA-C  ALPRAZolam Duanne Moron) 0.5 MG tablet Take 1 tablet (0.5 mg total) by mouth 2 (two) times daily as needed for anxiety. 08/21/18   Plotnikov, [name], MD  amLODipine (NORVASC) 5 MG tablet Take 5 mg by mouth daily.    [provider]  aspirin EC 81 MG tablet Take 81 mg by mouth daily.    [provider]  busPIRone (BUSPAR) 10 MG tablet Take 2 tablets (20 mg total) by mouth 2 (two) times daily. Patient taking differently: Take 30 mg by mouth 2 (two) times daily.  06/03/18   Plotnikov, [name], MD  Cholecalciferol (D3-1000) 1000 units capsule Take 1,000 Units by mouth daily.    [provider]  Cyanocobalamin (VITAMIN B-12) 1000 MCG SUBL Place 1 tablet (1,000 mcg total) under the tongue daily. Patient taking differently: Place 2,000 mcg under the tongue daily.  05/05/16   Plotnikov, [name], MD  D-Mannose 500 MG CAPS Take 500 mg by mouth daily. WITH CRANBERRY & DANDELION EXTRACT    [provider]  doxycycline (VIBRAMYCIN) 100 MG capsule Take 1 capsule (100 mg total) by mouth 2 (two) times daily. 05/27/19   Tasia Catchings, Stuart Guillen V, PA-C  esomeprazole (NEXIUM) 40 MG capsule TAKE 1 CAPSULE BY MOUTH EVERY MORNING 11/20/18   Kozlow, Donnamarie Poag, MD  estradiol (ESTRACE) 1 MG tablet Take 1 tablet (1 mg total) by mouth daily. 10/28/14   Niel Hummer, NP  famotidine (PEPCID) 40 MG tablet TAKE 1 TABLET BY MOUTH EVERY DAY IN THE EVENING 04/03/19   Kozlow, Donnamarie Poag, MD  FLUoxetine (PROZAC) 40 MG capsule Take 1 capsule (40 mg total) by mouth every morning. 06/03/18   Plotnikov, [name], MD  hydrochlorothiazide (MICROZIDE) 12.5 MG capsule TAKE 1 CAPSULE BY MOUTH EVERY DAY 02/26/19   Richardo Priest, MD  ipratropium-albuterol (DUONEB) 0.5-2.5 (3) MG/3ML SOLN Take 3 mLs by nebulization every 6  (six) hours as needed. 05/27/19   Tasia Catchings, Reginae Wolfrey V, PA-C  iron polysaccharides (FERREX 150) 150 MG capsule Take 1 capsule (150 mg total) by mouth 2 (two) times daily. 08/21/18   Plotnikov, [name], MD  Lactobacillus ([employer] PROBIOTIC ACIDOPHILUS PO) Take 1 capsule by mouth daily.    [provider]  mirtazapine (REMERON) 30 MG tablet Take 1 tablet (30 mg total) by mouth at bedtime. 06/03/18   Plotnikov, [name], MD  Multiple Vitamins-Minerals (MULTIVITAMIN WITH MINERALS)  tablet Take 1 tablet by mouth daily. 10/28/14   Niel Hummer, NP  predniSONE (DELTASONE) 50 MG tablet Take 1 tablet (50 mg total) by mouth daily with breakfast. 05/27/19   Tasia Catchings, Zaid Tomes V, PA-C  progesterone (PROMETRIUM) 100 MG capsule Take 100 mg by mouth at bedtime.    [provider]    Family History Family History  Problem Relation Age of Onset  . Coronary artery disease Father   . Heart disease Father   . Hypertension Father   . Irritable bowel syndrome Father   . Arthritis Mother   . Hypertension Mother   . Migraines Mother   . Heart disease Mother   . Diabetes Maternal Grandmother   . Allergies Maternal Grandmother   . Irritable bowel syndrome Sister   . Irritable bowel syndrome Brother   . Colon cancer Neg Hx   . Esophageal cancer Neg Hx   . Rectal cancer Neg Hx   . Stomach cancer Neg Hx     Social History Social History   Tobacco Use  . Smoking status: Never Smoker  . Smokeless tobacco: Never Used  Substance Use Topics  . Alcohol use: Not Currently    Alcohol/week: 2.0 - 3.0 standard drinks    Types: 2 - 3 Standard drinks or equivalent per week    Comment: rarely  . Drug use: No     Allergies   Buprenorphine hcl, Morphine and related, Keflex [cephalexin], Other, Codeine, Dust mite extract, and Molds & smuts   Review of Systems Review of Systems  Reason unable to perform ROS: See HPI as above.     Physical Exam Triage Vital Signs ED Triage Vitals  Enc Vitals Group     BP 05/27/19  1138 132/83     Pulse Rate 05/27/19 1138 70     Resp 05/27/19 1138 18     Temp 05/27/19 1138 98.2 F (36.8 C)     Temp Source 05/27/19 1138 Oral     SpO2 05/27/19 1138 95 %     Weight --      Height --      Head Circumference --      Peak Flow --      Pain Score 05/27/19 1139 0     Pain Loc --      Pain Edu? --      Excl. in [place]? --    No data found.  Updated Vital Signs BP 132/83 (BP Location: Left Arm)   Pulse 70   Temp 98.2 F (36.8 C) (Oral)   Resp 18   SpO2 95%   Physical Exam Constitutional:      General: She is not in acute distress.    Appearance: Normal appearance. She is not ill-appearing, toxic-appearing or diaphoretic.  HENT:     Head: Normocephalic and atraumatic.     Nose:     Right Sinus: Maxillary sinus tenderness present. No frontal sinus tenderness.     Left Sinus: Maxillary sinus tenderness present. No frontal sinus tenderness.     Mouth/Throat:     Mouth: Mucous membranes are moist.     Pharynx: Oropharynx is clear. Uvula midline.  Cardiovascular:     Rate and Rhythm: Normal rate and regular rhythm.     Heart sounds: Normal heart sounds. No murmur. No friction rub. No gallop.   Pulmonary:     Effort: Pulmonary effort is normal. No accessory muscle usage, prolonged expiration, respiratory distress or retractions.     Comments:  Speaking in full sentences without difficulty.  Bronchitic cough throughout exam.  Lungs clear to auscultation without adventitious lung sounds. Musculoskeletal:     Cervical back: Normal range of motion and neck supple.  Neurological:     General: No focal deficit present.     Mental Status: She is alert and oriented to person, place, and time.     UC Treatments / Results  Labs (all labs ordered are listed, but only abnormal results are displayed) Labs Reviewed - No data to display  EKG   Radiology No results found.  Procedures Procedures (including critical care time)  Medications Ordered in UC Medications -  No data to display  Initial Impression / Assessment and Plan / UC Course  I have reviewed the triage vital signs and the nursing notes.  Pertinent labs & imaging results that were available during my care of the patient were reviewed by me and considered in my medical decision making (see chart for details).    No alarming signs on exam.  Discussed history and exam most consistent with bronchitis, sinusitis.  Start doxycycline for possible bacterial sinusitis.  Prednisone for bronchitis.  Will provide albuterol inhaler, nebulizer for symptomatic relief.  Push fluids.  Return precautions given.  Patient expresses understanding and agrees to plan.  Final Clinical Impressions(s) / UC Diagnoses   Final diagnoses:  Acute bronchitis, unspecified organism  Acute non-recurrent pansinusitis   ED Prescriptions    Medication Sig Dispense Auth. Provider   doxycycline (VIBRAMYCIN) 100 MG capsule Take 1 capsule (100 mg total) by mouth 2 (two) times daily. 20 capsule Teosha Casso V, PA-C   albuterol (VENTOLIN HFA) 108 (90 Base) MCG/ACT inhaler Inhale 1-2 puffs into the lungs every 6 (six) hours as needed for wheezing or shortness of breath. 8 g Natalee Tomkiewicz V, PA-C   albuterol (PROVENTIL) (2.5 MG/3ML) 0.083% nebulizer solution Take 3 mLs (2.5 mg total) by nebulization every 6 (six) hours as needed for wheezing or shortness of breath. 75 mL Charene Mccallister V, PA-C   ipratropium-albuterol (DUONEB) 0.5-2.5 (3) MG/3ML SOLN Take 3 mLs by nebulization every 6 (six) hours as needed. 6 mL Derreck Wiltsey V, PA-C   predniSONE (DELTASONE) 50 MG tablet Take 1 tablet (50 mg total) by mouth daily with breakfast. 5 tablet Ok Edwards, PA-C     PDMP not reviewed this encounter.   Ok Edwards, PA-C 05/27/19 1342

## 2019-05-29 ENCOUNTER — Ambulatory Visit: Payer: Medicare Other | Admitting: Psychology

## 2019-05-30 ENCOUNTER — Other Ambulatory Visit: Payer: Medicare Other

## 2019-05-31 ENCOUNTER — Other Ambulatory Visit: Payer: Medicare Other

## 2019-05-31 ENCOUNTER — Ambulatory Visit: Payer: Medicare Other | Attending: Internal Medicine

## 2019-05-31 DIAGNOSIS — Z20822 Contact with and (suspected) exposure to covid-19: Secondary | ICD-10-CM | POA: Diagnosis not present

## 2019-06-02 LAB — NOVEL CORONAVIRUS, NAA: SARS-CoV-2, NAA: NOT DETECTED

## 2019-06-05 ENCOUNTER — Ambulatory Visit: Payer: Medicare Other | Attending: Internal Medicine

## 2019-06-05 DIAGNOSIS — Z23 Encounter for immunization: Secondary | ICD-10-CM | POA: Insufficient documentation

## 2019-06-05 NOTE — Progress Notes (Signed)
   Covid-19 Vaccination Clinic  Name:  [name]    MRN: [id number] DOB: [birth year]  06/05/2019  [name] was observed post Covid-19 immunization for 15 minutes without incidence. She was provided with Vaccine Information Sheet and instruction to access the V-Safe system.   [name] was instructed to call 911 with any severe reactions post vaccine: Marland Kitchen Difficulty breathing  . Swelling of your face and throat  . A fast heartbeat  . A bad rash all over your body  . Dizziness and weakness    Immunizations Administered    Name Date Dose VIS Date Route   Pfizer COVID-19 Vaccine 06/05/2019  4:22 PM 0.3 mL 04/27/2019 Intramuscular   Manufacturer: [place]   Lot: [id number]   [place]: [id number]

## 2019-06-06 ENCOUNTER — Ambulatory Visit: Payer: Medicare Other | Admitting: Psychology

## 2019-06-12 ENCOUNTER — Ambulatory Visit: Payer: Self-pay

## 2019-06-13 DIAGNOSIS — L821 Other seborrheic keratosis: Secondary | ICD-10-CM | POA: Diagnosis not present

## 2019-06-20 ENCOUNTER — Ambulatory Visit (INDEPENDENT_AMBULATORY_CARE_PROVIDER_SITE_OTHER): Payer: Medicare Other | Admitting: Psychology

## 2019-06-20 DIAGNOSIS — F4321 Adjustment disorder with depressed mood: Secondary | ICD-10-CM

## 2019-06-23 ENCOUNTER — Ambulatory Visit: Payer: Medicare Other | Attending: Internal Medicine

## 2019-06-23 DIAGNOSIS — Z23 Encounter for immunization: Secondary | ICD-10-CM | POA: Insufficient documentation

## 2019-06-23 NOTE — Progress Notes (Signed)
   Covid-19 Vaccination Clinic  Name:  Meia Emley    MRN: [id number] DOB: 01/01/1951  06/23/2019  Ms. Masters Luetta Nutting was observed post Covid-19 immunization for 15 minutes without incidence. She was provided with Vaccine Information Sheet and instruction to access the V-Safe system.   Ms. Jackalyn Lombard was instructed to call 911 with any severe reactions post vaccine: Marland Kitchen Difficulty breathing  . Swelling of your face and throat  . A fast heartbeat  . A bad rash all over your body  . Dizziness and weakness    Immunizations Administered    Name Date Dose VIS Date Route   Pfizer COVID-19 Vaccine 06/23/2019 10:58 AM 0.3 mL 04/27/2019 Intramuscular   Manufacturer: [place]   Lot: [id number]   [place]: [id number]

## 2019-06-28 NOTE — Progress Notes (Deleted)
Cardiology Office Note:    Date:  06/28/2019   ID:  [name], DOB March 03, 1951, MRN [id number]  PCP:  Cassandria Anger, MD  Cardiologist:  Shirlee More, MD    Referring MD: Cassandria Anger, MD    ASSESSMENT:    No diagnosis found. PLAN:    In order of problems listed above:  1. ***   Next appointment: ***   Medication Adjustments/Labs and Tests Ordered: Current medicines are reviewed at length with the patient today.  Concerns regarding medicines are outlined above.  No orders of the defined types were placed in this encounter.  No orders of the defined types were placed in this encounter.   No chief complaint on file.   History of Present Illness:    [name] is a 69 y.o. female with a hx of hypertension CKD stage 3 and exertional dyspnea.  She underwent left and right heart catheterization 08/26/2017 with normal coronary arteriography and hemodynamics and left ventricular function last seen 01/19/2019. Compliance with diet, lifestyle and medications: *** Past Medical History:  Diagnosis Date  . Abdominal pain, generalized 03/26/2014  . Acute upper respiratory infection 05/05/2016   12/17  . Adaptive colitis 12/05/2013  . Adult hypothyroidism 12/05/2013   On Levothroid  . Allergic rhinitis, cause unspecified   . Anemia 05/31/2017   Iron def  . Anxiety   . Asthma    chronic cough  . Bipolar I disorder, most recent episode (or current) depressed, severe, without mention of psychotic behavior   . Blood transfusion without reported diagnosis   . 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 Cephalalgia 12/05/2013  . 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 Chest pain, atypical 07/13/2017   2/19 CT abd/chest - ok in June 2018  . Chronic depression 10/13/2016  . Cold intolerance 03/26/2014  . Concussion w/o coma 09/18/2014  . Cough variant asthma  vs UACS/ irritable larynx  03/26/2014   FENO 08/11/2016  =   17 p no rx    . Deep  phlebothrombosis, postpartum 02/14/2008   Overview:  Overview:  Annotation: affected mostly left leg. Qualifier: Diagnosis of  By: Linda Hedges MD, Heinz Knuckles   . Deep phlebothrombosis, postpartum, unspecified as to episode of care(671.40)   . Depression   . Feeling bilious 12/05/2013  . Female genuine stress incontinence 04/12/2012  . Fibromyalgia   . Gastritis   . GERD 03/04/2007   Protonix   . GERD (gastroesophageal reflux disease)   . HTN (hypertension) 05/31/2017   1/19 Verapamil bid 2/19 added Maxzide  . Hyperlipidemia   . IBS (irritable bowel syndrome)   . 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 Irritable bowel syndrome with diarrhea 09/12/2014   Dr Olevia Perches  . IRRITABLE BOWEL SYNDROME, HX OF 03/04/2007   Qualifier: Diagnosis of  By: Quentin Cornwall CMA, Janett Billow    . Laryngopharyngeal reflux (LPR) 06/15/2016  . Leg pain, anterior, left 12/15/2016   8/18 2/19 L  . 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 Lung mass 09/10/2013  . Lung nodule, solitary 09/10/2013  . Malaise and fatigue 04/16/2015  . Menopause 12/05/2013  . Migraine headache   . Mild cognitive disorder 12/05/2013  . MUSCLE PAIN 07/03/2008   Qualifier: Diagnosis of  By: Jenny Reichmann MD, Hunt Oris   . Other and unspecified hyperlipidemia   . Parathyroid adenoma 2014  . Paresthesia  12/15/2016   8/18 L lat foot - ?peroneal nerve damage  . Positive skin test 03/26/2014  . Post concussion syndrome 10/28/2014  . Severe episode of recurrent major depressive disorder, without psychotic features ([place]) 12/18/2014   On Fluoxetine   . Status post chemotherapy 2014  . Syncope 10/13/2016  . Unspecified hypothyroidism 04/25/2013  . Vitamin D deficiency 04/25/2013    Past Surgical History:  Procedure Laterality Date  . ANTERIOR FUSION CERVICAL SPINE  2006  . BLADDER SURGERY  06/2016   [facility], piece of mesh with necrotic tissue removed  . CATARACT  EXTRACTION, BILATERAL  2019  . CESAREAN SECTION  1986  . CHOLECYSTECTOMY N/A 05/03/2013   Procedure: LAPAROSCOPIC CHOLECYSTECTOMY WITH INTRAOPERATIVE CHOLANGIOGRAM;  Surgeon: Odis Hollingshead, MD;  Location: [facility];  Service: General;  Laterality: N/A;  . COLONOSCOPY    . herniated discectomy  2019  . laprotomy  04/2012   for cancer  . NECK SURGERY  2006   C spine fusion  . POSTERIOR FUSION CERVICAL SPINE  2016  . PUBOVAGINAL SLING    . RIGHT/LEFT HEART CATH AND CORONARY ANGIOGRAPHY N/A 08/26/2017   Procedure: RIGHT/LEFT HEART CATH AND CORONARY ANGIOGRAPHY;  Surgeon: Burnell Blanks, MD;  Location: [place] [facility];  Service: Cardiovascular;  Laterality: N/A;  . VAGINAL HYSTERECTOMY      Current Medications: No outpatient medications have been marked as taking for the 06/29/19 encounter (Appointment) with Richardo Priest, MD.     Allergies:   Buprenorphine hcl, Morphine and related, Keflex [cephalexin], Other, Codeine, Dust mite extract, and Molds & smuts   Social History   Socioeconomic History  . Marital status: Divorced    Spouse name: Not on file  . Number of children: 2  . Years of education: 1  . Highest education level: Not on file  Occupational History  . 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 Smoking status: Never Smoker  . Smokeless tobacco: Never Used  Substance and Sexual Activity  . Alcohol use: Not Currently    Alcohol/week: 2.0 - 3.0 standard drinks    Types: 2 - 3 Standard drinks or equivalent per week    Comment: rarely  . Drug use: No  . Sexual activity: Not Currently    Partners: Male  Other Topics Concern  . Not on file  Social History Narrative   Married 4 years- divorced; married 10 years- divorced. Serially monogamous relationships. Remarried March '11. 2 sons- '82, '87.    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 (June "12).      Lives in her own home.  Currently going through a divorce.   Social Determinants of Health   Financial Resource Strain:   . Difficulty of Paying Living Expenses: Not on file  Food Insecurity:   . Worried About Charity fundraiser in the Last Year: Not on file  . Ran Out of Food in the Last Year: Not on file  Transportation Needs:   . Lack of Transportation (Medical): Not on file  . Lack of Transportation (Non-Medical): Not on file  Physical Activity:   . Days of Exercise per Week: Not on file  . Minutes of Exercise per Session: Not on file  Stress:   . Feeling of Stress : Not on file  Social Connections:   . Frequency of Communication with Friends and Family: Not on file  .  Frequency of Social Gatherings with Friends and Family: Not on file  . Attends Religious Services: Not on file  . Active Member of Clubs or Organizations: Not on file  . Attends Archivist Meetings: Not on file  . Marital Status: Not on file     Family History: The patient's ***family history includes Allergies in her maternal grandmother; Arthritis in her mother; Coronary artery disease in her father; Diabetes in her maternal grandmother; Heart disease in her father and mother; Hypertension in her father and mother; Irritable bowel syndrome in her brother, father, and sister; Migraines in her mother. There is no history of Colon cancer, Esophageal cancer, Rectal cancer, or Stomach cancer. ROS:   Please see the history of present illness.    All other systems reviewed and are negative.  EKGs/Labs/Other Studies Reviewed:    The following studies were reviewed today:  EKG:  EKG ordered today and personally reviewed.  The ekg ordered today demonstrates ***  Recent Labs: 10/18/2018: ALT 16; BUN 24; Creatinine, Ser 1.29; Hemoglobin 12.5; Platelets 402.0; Potassium 4.5; Sodium 131; TSH 0.32  Recent Lipid Panel    Component Value Date/Time   CHOL 263 (H) 10/18/2018 1422   TRIG 271.0 (H) 10/18/2018  1422   HDL 45.40 10/18/2018 1422   CHOLHDL 6 10/18/2018 1422   VLDL 54.2 (H) 10/18/2018 1422   LDLCALC UNABLE TO CALCULATE IF TRIGLYCERIDE OVER 400 mg/dL 08/26/2017 0220   LDLDIRECT 160.0 10/18/2018 1422    Physical Exam:    VS:  There were no vitals taken for this visit.    Wt Readings from Last 3 Encounters:  05/17/19 158 lb (71.7 kg)  03/06/19 148 lb (67.1 kg)  01/19/19 149 lb 8 oz (67.8 kg)     GEN: *** Well nourished, well developed in no acute distress HEENT: Normal NECK: No JVD; No carotid bruits LYMPHATICS: No lymphadenopathy CARDIAC: ***RRR, no murmurs, rubs, gallops RESPIRATORY:  Clear to auscultation without rales, wheezing or rhonchi  ABDOMEN: Soft, non-tender, non-distended MUSCULOSKELETAL:  No edema; No deformity  SKIN: Warm and dry NEUROLOGIC:  Alert and oriented x 3 PSYCHIATRIC:  Normal affect    Signed, Shirlee More, MD  06/28/2019 2:15 PM    [facility]

## 2019-06-29 ENCOUNTER — Ambulatory Visit: Payer: Medicare Other | Admitting: Cardiology

## 2019-07-17 ENCOUNTER — Ambulatory Visit (INDEPENDENT_AMBULATORY_CARE_PROVIDER_SITE_OTHER): Payer: Medicare Other | Admitting: Psychology

## 2019-07-17 DIAGNOSIS — F4321 Adjustment disorder with depressed mood: Secondary | ICD-10-CM | POA: Diagnosis not present

## 2019-07-21 ENCOUNTER — Other Ambulatory Visit: Payer: Self-pay | Admitting: Internal Medicine

## 2019-07-24 ENCOUNTER — Other Ambulatory Visit: Payer: Self-pay | Admitting: Internal Medicine

## 2019-07-24 NOTE — Progress Notes (Signed)
Cardiology Office Note:    Date:  07/25/2019   ID:  [name], DOB 1950-11-21, MRN [id number]  PCP:  Cassandria Anger, MD  Cardiologist:  Shirlee More, MD    Referring MD: Cassandria Anger, MD    ASSESSMENT:    1. Essential hypertension   2. Stage 3 chronic kidney disease, unspecified whether stage 3a or 3b CKD    PLAN:    In order of problems listed above:  1. Blood pressure is not as good as before is at risk for needing an additional agent I strongly encouraged activity and weight loss and close observation.  She will discuss with nephrology 2. Follow-up today with nephrology labs will be drawn   Next appointment: 6 months   Medication Adjustments/Labs and Tests Ordered: Current medicines are reviewed at length with the patient today.  Concerns regarding medicines are outlined above.  No orders of the defined types were placed in this encounter.  No orders of the defined types were placed in this encounter.   Chief Complaint  Patient presents with  . Follow-up  . Hypertension    History of Present Illness:    [name] [name] is a 69 y.o. female with a hx of hypertension stage III CKD last seen 01/19/2019.  She underwent coronary angiography left and right heart catheterization 08/26/2017 with normal hemodynamics ventricular function and coronary arteriography.Marland Kitchen  05/27/2019 with acute bronchitis COVID-19 negative and was treated with steroid antibiotic and bronchodilator.  COVID-19 test -44-month ago Compliance with diet, lifestyle and medications: Yes  She is following up with nephrology today I am not can draw labs or change her antihypertensives.  No chest pain shortness of breath palpitation or syncope.  She is an active intends to get back to her yoga and exercise and her weight is up 12 pounds since October 2020.  Repeat blood pressure by me is 138/90 I told her she either needs another antihypertensive agent her activity and weight loss.   She will discuss this with her nephrologist this afternoon and will have labs drawn.  Her previous symptoms of chest pain or shortness of breath have not recurred and she had previous normal coronary angiography.  She is not on lipid-lowering therapy. Past Medical History:  Diagnosis Date  . Abdominal pain, generalized 03/26/2014  . Acute upper respiratory infection 05/05/2016   12/17  . Adaptive colitis 12/05/2013  . Adult hypothyroidism 12/05/2013   On Levothroid  . Allergic rhinitis, cause unspecified   . Anemia 05/31/2017   Iron def  . Anxiety   . Asthma    chronic cough  . Bipolar I disorder, most recent episode (or current) depressed, severe, without mention of psychotic behavior   . Blood transfusion without reported diagnosis   . 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 Cephalalgia 12/05/2013  . 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 Chest pain, atypical 07/13/2017   2/19 CT abd/chest - ok in June 2018  . Chronic depression 10/13/2016  . Cold intolerance 03/26/2014  . Concussion w/o coma 09/18/2014  . Cough variant asthma  vs UACS/ irritable larynx  03/26/2014   FENO 08/11/2016  =   17 p no rx    . Deep phlebothrombosis, postpartum 02/14/2008   Overview:  Overview:  Annotation: affected mostly left leg. Qualifier: Diagnosis of  By: Linda Hedges MD, Heinz Knuckles   . Deep phlebothrombosis, postpartum, unspecified as to episode of care(671.40)   . Depression   . Feeling bilious 12/05/2013  .  Female genuine stress incontinence 04/12/2012  . Fibromyalgia   . Gastritis   . GERD 03/04/2007   Protonix   . GERD (gastroesophageal reflux disease)   . HTN (hypertension) 05/31/2017   1/19 Verapamil bid 2/19 added Maxzide  . Hyperlipidemia   . IBS (irritable bowel syndrome)   . 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 Irritable bowel syndrome with diarrhea 09/12/2014   Dr Olevia Perches  . IRRITABLE BOWEL SYNDROME, HX OF 03/04/2007   Qualifier: Diagnosis of  By: Quentin Cornwall CMA, Janett Billow    .  Laryngopharyngeal reflux (LPR) 06/15/2016  . Leg pain, anterior, left 12/15/2016   8/18 2/19 L  . Leiomyosarcoma ([place]) 09/10/2013   Vaginal wall - rare tumor 12/12 Dr Matthew Saras S/p chemo  . 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 Lung mass 09/10/2013  . Lung nodule, solitary 09/10/2013  . Malaise and fatigue 04/16/2015  . Menopause 12/05/2013  . Migraine headache   . Mild cognitive disorder 12/05/2013  . MUSCLE PAIN 07/03/2008   Qualifier: Diagnosis of  By: Jenny Reichmann MD, Hunt Oris   . Other and unspecified hyperlipidemia   . Parathyroid adenoma 2014  . Paresthesia 12/15/2016   8/18 L lat foot - ?peroneal nerve damage  . Positive skin test 03/26/2014  . Post concussion syndrome 10/28/2014  . Severe episode of recurrent major depressive disorder, without psychotic features ([place]) 12/18/2014   On Fluoxetine   . Status post chemotherapy 2014  . Syncope 10/13/2016  . Unspecified hypothyroidism 04/25/2013  . Vitamin D deficiency 04/25/2013    Past Surgical History:  Procedure Laterality Date  . ANTERIOR FUSION CERVICAL SPINE  2006  . BLADDER SURGERY  06/2016   [facility], piece of mesh with necrotic tissue removed  . CATARACT EXTRACTION, BILATERAL  2019  . CESAREAN SECTION  1986  . CHOLECYSTECTOMY N/A 05/03/2013   Procedure: LAPAROSCOPIC CHOLECYSTECTOMY WITH INTRAOPERATIVE CHOLANGIOGRAM;  Surgeon: Odis Hollingshead, MD;  Location: [place];  Service: General;  Laterality: N/A;  . COLONOSCOPY    . herniated discectomy  2019  . laprotomy  04/2012   for cancer  . NECK SURGERY  2006   C spine fusion  . POSTERIOR FUSION CERVICAL SPINE  2016  . PUBOVAGINAL SLING    . RIGHT/LEFT HEART CATH AND CORONARY ANGIOGRAPHY N/A 08/26/2017   Procedure: RIGHT/LEFT HEART CATH AND CORONARY ANGIOGRAPHY;  Surgeon: Burnell Blanks, MD;  Location: [place] [facility];  Service: Cardiovascular;  Laterality: N/A;  . VAGINAL HYSTERECTOMY      Current Medications: Current Meds  Medication Sig  . albuterol  (PROVENTIL) (2.5 MG/3ML) 0.083% nebulizer solution Take 3 mLs (2.5 mg total) by nebulization every 6 (six) hours as needed for wheezing or shortness of breath.  Marland Kitchen albuterol (VENTOLIN HFA) 108 (90 Base) MCG/ACT inhaler Inhale 1-2 puffs into the lungs every 6 (six) hours as needed for wheezing or shortness of breath.  . ALPRAZolam (XANAX) 0.5 MG tablet Take 1 tablet (0.5 mg total) by mouth 2 (two) times daily as needed for anxiety.  Marland Kitchen amLODipine (NORVASC) 5 MG tablet Take 5 mg by mouth daily.  Marland Kitchen aspirin EC 81 MG tablet Take 81 mg by mouth daily.  Marland Kitchen b complex vitamins tablet Take 1 tablet by mouth daily.  . busPIRone (BUSPAR) 10 MG tablet Take 2 tablets (20 mg total) by mouth 2 (two) times daily. (Patient taking differently: Take 30 mg by mouth 2 (two) times daily. )  . carvedilol (COREG) 12.5 MG tablet Take 12.5  mg by mouth 2 (two) times daily.  . Cholecalciferol (D3-1000) 1000 units capsule Take 1,000 Units by mouth daily.  . Cyanocobalamin (VITAMIN B-12) 1000 MCG SUBL Place 1 tablet (1,000 mcg total) under the tongue daily. (Patient taking differently: Place 2,000 mcg under the tongue daily. )  . D-Mannose 500 MG CAPS Take 500 mg by mouth daily. WITH CRANBERRY & DANDELION EXTRACT  . esomeprazole (NEXIUM) 40 MG capsule TAKE 1 CAPSULE BY MOUTH EVERY MORNING  . estradiol (ESTRACE) 1 MG tablet Take 1 tablet (1 mg total) by mouth daily.  . famotidine (PEPCID) 40 MG tablet TAKE 1 TABLET BY MOUTH EVERY DAY IN THE EVENING  . FLUoxetine (PROZAC) 40 MG capsule Take 1 capsule (40 mg total) by mouth every morning.  . hydrochlorothiazide (MICROZIDE) 12.5 MG capsule TAKE 1 CAPSULE BY MOUTH EVERY DAY  . iron polysaccharides (FERREX 150) 150 MG capsule Take 1 capsule (150 mg total) by mouth 2 (two) times daily.  . Lactobacillus (CVS PROBIOTIC ACIDOPHILUS PO) Take 1 capsule by mouth daily.  . mirtazapine (REMERON) 30 MG tablet Take 1 tablet (30 mg total) by mouth at bedtime.  . Multiple Vitamins-Minerals  (MULTIVITAMIN WITH MINERALS) tablet Take 1 tablet by mouth daily.  . Omega-3 Fatty Acids (FISH OIL) 1200 MG CAPS Take 1 capsule by mouth.  . ondansetron (ZOFRAN) 4 MG tablet Take 4 mg by mouth every 8 (eight) hours as needed for nausea or vomiting.  . progesterone (PROMETRIUM) 100 MG capsule Take 100 mg by mouth at bedtime.  . SUMAtriptan (IMITREX) 100 MG tablet TAKE 1/2-1 TABLET BY MOUTH EVERY 2 HOURS AS NEEDED FOR HEADACHE     Allergies:   Buprenorphine hcl, Morphine and related, Keflex [cephalexin], Other, Codeine, Dust mite extract, and Molds & smuts   Social History   Socioeconomic History  . Marital status: Divorced    Spouse name: Not on file  . Number of children: 2  . Years of education: 94  . Highest education level: Not on file  Occupational History  . 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 Smoking status: Never Smoker  . Smokeless tobacco: Never Used  Substance and Sexual Activity  . Alcohol use: Not Currently    Alcohol/week: 2.0 - 3.0 standard drinks    Types: 2 - 3 Standard drinks or equivalent per week    Comment: rarely  . Drug use: No  . Sexual activity: Not Currently    Partners: Male  Other Topics Concern  . Not on file  Social History Narrative   Married 4 years- divorced; married 10 years- divorced. Serially monogamous relationships. Remarried March '11. 2 sons- '82, '87.    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 (June "12).    Lives in her own home.  Currently going through a divorce.   Social Determinants of Health   Financial Resource Strain:   . Difficulty of Paying Living Expenses: Not on file  Food Insecurity:   . Worried About Charity fundraiser in the Last Year: Not on file  . Ran Out of Food in the Last Year: Not on file  Transportation Needs:   . Lack of Transportation (Medical): Not on file  . Lack of Transportation  (Non-Medical): Not on file  Physical Activity:   . Days of Exercise per Week: Not on file  . Minutes of Exercise per Session: Not on file  Stress:   .  Feeling of Stress : Not on file  Social Connections:   . Frequency of Communication with Friends and Family: Not on file  . Frequency of Social Gatherings with Friends and Family: Not on file  . Attends Religious Services: Not on file  . Active Member of Clubs or Organizations: Not on file  . Attends Archivist Meetings: Not on file  . Marital Status: Not on file     Family History: The patient's family history includes Allergies in her maternal grandmother; Arthritis in her mother; Coronary artery disease in her father; Diabetes in her maternal grandmother; Heart disease in her father and mother; Hypertension in her father and mother; Irritable bowel syndrome in her brother, father, and sister; Migraines in her mother. There is no history of Colon cancer, Esophageal cancer, Rectal cancer, or Stomach cancer. ROS:   Please see the history of present illness.    All other systems reviewed and are negative.  EKGs/Labs/Other Studies Reviewed:    The following studies were reviewed today:    Recent Labs: 10/18/2018: ALT 16; BUN 24; Creatinine, Ser 1.29; Hemoglobin 12.5; Platelets 402.0; Potassium 4.5; Sodium 131; TSH 0.32  Recent Lipid Panel    Component Value Date/Time   CHOL 263 (H) 10/18/2018 1422   TRIG 271.0 (H) 10/18/2018 1422   HDL 45.40 10/18/2018 1422   CHOLHDL 6 10/18/2018 1422   VLDL 54.2 (H) 10/18/2018 1422   LDLCALC UNABLE TO CALCULATE IF TRIGLYCERIDE OVER 400 mg/dL 08/26/2017 0220   LDLDIRECT 160.0 10/18/2018 1422    Physical Exam:    VS:  BP (!) 142/94   Pulse 69   Temp (!) 97.4 F (36.3 C)   Ht 5\' 2"  (1.575 m)   Wt 160 lb (72.6 kg)   SpO2 98%   BMI 29.26 kg/m     Wt Readings from Last 3 Encounters:  07/25/19 160 lb (72.6 kg)  05/17/19 158 lb (71.7 kg)  03/06/19 148 lb (67.1 kg)    Repeat  blood pressure me 138/90 GEN:  Well nourished, well developed in no acute distress HEENT: Normal NECK: No JVD; No carotid bruits LYMPHATICS: No lymphadenopathy CARDIAC: RRR, no murmurs, rubs, gallops RESPIRATORY:  Clear to auscultation without rales, wheezing or rhonchi  ABDOMEN: Soft, non-tender, non-distended MUSCULOSKELETAL:  No edema; No deformity  SKIN: Warm and dry NEUROLOGIC:  Alert and oriented x 3 PSYCHIATRIC:  Normal affect    Signed, Shirlee More, MD  07/25/2019 11:44 AM    [place]

## 2019-07-25 ENCOUNTER — Other Ambulatory Visit: Payer: Self-pay

## 2019-07-25 ENCOUNTER — Ambulatory Visit: Payer: Medicare Other | Admitting: Cardiology

## 2019-07-25 ENCOUNTER — Encounter: Payer: Self-pay | Admitting: Cardiology

## 2019-07-25 VITALS — BP 142/94 | HR 69 | Temp 97.4°F | Ht 62.0 in | Wt 160.0 lb

## 2019-07-25 DIAGNOSIS — N189 Chronic kidney disease, unspecified: Secondary | ICD-10-CM | POA: Diagnosis not present

## 2019-07-25 DIAGNOSIS — N183 Chronic kidney disease, stage 3 unspecified: Secondary | ICD-10-CM | POA: Diagnosis not present

## 2019-07-25 DIAGNOSIS — D631 Anemia in chronic kidney disease: Secondary | ICD-10-CM | POA: Diagnosis not present

## 2019-07-25 DIAGNOSIS — I10 Essential (primary) hypertension: Secondary | ICD-10-CM

## 2019-07-25 DIAGNOSIS — E559 Vitamin D deficiency, unspecified: Secondary | ICD-10-CM | POA: Diagnosis not present

## 2019-07-25 DIAGNOSIS — N3000 Acute cystitis without hematuria: Secondary | ICD-10-CM | POA: Diagnosis not present

## 2019-07-25 DIAGNOSIS — I129 Hypertensive chronic kidney disease with stage 1 through stage 4 chronic kidney disease, or unspecified chronic kidney disease: Secondary | ICD-10-CM | POA: Diagnosis not present

## 2019-07-25 NOTE — Patient Instructions (Signed)
Medication Instructions:  *If you need a refill on your cardiac medications before your next appointment, please call your pharmacy*  Lab Work: If you have labs (blood work) drawn today and your tests are completely normal, you will receive your results only by: Marland Kitchen MyChart Message (if you have MyChart) OR . A paper copy in the mail If you have any lab test that is abnormal or we need to change your treatment, we will call you to review the results.  Follow-Up: At [facility], you and your health needs are our priority.  As part of our continuing mission to provide you with exceptional heart care, we have created designated Provider Care Teams.  These Care Teams include your primary Cardiologist (physician) and Advanced Practice Providers (APPs -  Physician Assistants and Nurse Practitioners) who all work together to provide you with the care you need, when you need it.  We recommend signing up for the patient portal called "MyChart".  Sign up information is provided on this After Visit Summary.  MyChart is used to connect with patients for Virtual Visits (Telemedicine).  Patients are able to view lab/test results, encounter notes, upcoming appointments, etc.  Non-urgent messages can be sent to your provider as well.   To learn more about what you can do with MyChart, go to [email or web address].    Your next appointment:   6 month(s)  The format for your next appointment:   In Person  Provider:   You may see Dr. Bettina Gavia or the following Advanced Practice Provider on your designated Care Team:    Laurann Montana, FNP

## 2019-07-26 ENCOUNTER — Telehealth: Payer: Self-pay | Admitting: Internal Medicine

## 2019-07-26 NOTE — Telephone Encounter (Signed)
Called patient to reschedule AWV 2x (07/20/19, 07/26/19) from 07/30/2019 to April 2021. Left message informing patient that appointment has been cancelled and that she will need to call office to reschedule. SF

## 2019-07-30 ENCOUNTER — Ambulatory Visit: Payer: Medicare Other

## 2019-07-31 ENCOUNTER — Ambulatory Visit (INDEPENDENT_AMBULATORY_CARE_PROVIDER_SITE_OTHER): Payer: Medicare Other | Admitting: Psychology

## 2019-07-31 DIAGNOSIS — F4321 Adjustment disorder with depressed mood: Secondary | ICD-10-CM

## 2019-08-08 DIAGNOSIS — H04123 Dry eye syndrome of bilateral lacrimal glands: Secondary | ICD-10-CM | POA: Diagnosis not present

## 2019-08-08 DIAGNOSIS — H43813 Vitreous degeneration, bilateral: Secondary | ICD-10-CM | POA: Diagnosis not present

## 2019-08-08 DIAGNOSIS — Z961 Presence of intraocular lens: Secondary | ICD-10-CM | POA: Diagnosis not present

## 2019-08-08 DIAGNOSIS — H35373 Puckering of macula, bilateral: Secondary | ICD-10-CM | POA: Diagnosis not present

## 2019-08-11 ENCOUNTER — Other Ambulatory Visit: Payer: Self-pay | Admitting: Internal Medicine

## 2019-08-14 ENCOUNTER — Ambulatory Visit: Payer: Medicare Other | Admitting: Psychology

## 2019-08-16 DIAGNOSIS — R3989 Other symptoms and signs involving the genitourinary system: Secondary | ICD-10-CM | POA: Insufficient documentation

## 2019-08-16 HISTORY — DX: Other symptoms and signs involving the genitourinary system: R39.89

## 2019-08-22 ENCOUNTER — Ambulatory Visit: Payer: Medicare Other | Admitting: Internal Medicine

## 2019-08-27 ENCOUNTER — Ambulatory Visit (INDEPENDENT_AMBULATORY_CARE_PROVIDER_SITE_OTHER): Payer: Medicare Other | Admitting: Internal Medicine

## 2019-08-27 ENCOUNTER — Encounter: Payer: Self-pay | Admitting: Internal Medicine

## 2019-08-27 ENCOUNTER — Other Ambulatory Visit: Payer: Self-pay

## 2019-08-27 ENCOUNTER — Ambulatory Visit (INDEPENDENT_AMBULATORY_CARE_PROVIDER_SITE_OTHER): Payer: Medicare Other | Admitting: Psychology

## 2019-08-27 ENCOUNTER — Other Ambulatory Visit (INDEPENDENT_AMBULATORY_CARE_PROVIDER_SITE_OTHER): Payer: Medicare Other

## 2019-08-27 VITALS — BP 134/86 | HR 69 | Temp 98.1°F | Ht 62.0 in | Wt 158.0 lb

## 2019-08-27 DIAGNOSIS — C499 Malignant neoplasm of connective and soft tissue, unspecified: Secondary | ICD-10-CM

## 2019-08-27 DIAGNOSIS — R600 Localized edema: Secondary | ICD-10-CM

## 2019-08-27 DIAGNOSIS — R140 Abdominal distension (gaseous): Secondary | ICD-10-CM

## 2019-08-27 DIAGNOSIS — N183 Chronic kidney disease, stage 3 unspecified: Secondary | ICD-10-CM

## 2019-08-27 DIAGNOSIS — C55 Malignant neoplasm of uterus, part unspecified: Secondary | ICD-10-CM | POA: Diagnosis not present

## 2019-08-27 DIAGNOSIS — R1084 Generalized abdominal pain: Secondary | ICD-10-CM

## 2019-08-27 DIAGNOSIS — Z20822 Contact with and (suspected) exposure to covid-19: Secondary | ICD-10-CM

## 2019-08-27 DIAGNOSIS — R609 Edema, unspecified: Secondary | ICD-10-CM | POA: Insufficient documentation

## 2019-08-27 DIAGNOSIS — I10 Essential (primary) hypertension: Secondary | ICD-10-CM | POA: Diagnosis not present

## 2019-08-27 DIAGNOSIS — F4321 Adjustment disorder with depressed mood: Secondary | ICD-10-CM

## 2019-08-27 HISTORY — DX: Edema, unspecified: R60.9

## 2019-08-27 LAB — CBC WITH DIFFERENTIAL/PLATELET
Basophils Absolute: 0.1 10*3/uL (ref 0.0–0.1)
Basophils Relative: 0.9 % (ref 0.0–3.0)
Eosinophils Absolute: 0.3 10*3/uL (ref 0.0–0.7)
Eosinophils Relative: 2.8 % (ref 0.0–5.0)
HCT: 37.9 % (ref 36.0–46.0)
Hemoglobin: 13.2 g/dL (ref 12.0–15.0)
Lymphocytes Relative: 24 % (ref 12.0–46.0)
Lymphs Abs: 2.4 10*3/uL (ref 0.7–4.0)
MCHC: 34.8 g/dL (ref 30.0–36.0)
MCV: 90.5 fl (ref 78.0–100.0)
Monocytes Absolute: 0.6 10*3/uL (ref 0.1–1.0)
Monocytes Relative: 6.1 % (ref 3.0–12.0)
Neutro Abs: 6.5 10*3/uL (ref 1.4–7.7)
Neutrophils Relative %: 66.2 % (ref 43.0–77.0)
Platelets: 435 10*3/uL — ABNORMAL HIGH (ref 150.0–400.0)
RBC: 4.19 Mil/uL (ref 3.87–5.11)
RDW: 13.7 % (ref 11.5–15.5)
WBC: 9.8 10*3/uL (ref 4.0–10.5)

## 2019-08-27 MED ORDER — FUROSEMIDE 20 MG PO TABS: 20.0000 mg | ORAL_TABLET | Freq: Every day | ORAL | 3 refills | Status: DC

## 2019-08-27 NOTE — Progress Notes (Signed)
Subjective:  Patient ID: [name], female    DOB: Sep 01, 1950  Age: 69 y.o. MRN: [id number]  CC: No chief complaint on file.   HPI [name] Turnbaugh presents for UTI sx's x 1 wk - treated. C/o  Leg edema x 1 wk - worse. C/o abd pain, bloating, back pain x 1 wk - worse No fever Amlodipine was increased up to 10 mg a day 1 month ago  Outpatient Medications Prior to Visit  Medication Sig Dispense Refill  . Acetaminophen (TYLENOL PO) Take by mouth as needed.    Marland Kitchen albuterol (PROVENTIL) (2.5 MG/3ML) 0.083% nebulizer solution Take 3 mLs (2.5 mg total) by nebulization every 6 (six) hours as needed for wheezing or shortness of breath. 75 mL 0  . albuterol (VENTOLIN HFA) 108 (90 Base) MCG/ACT inhaler Inhale 1-2 puffs into the lungs every 6 (six) hours as needed for wheezing or shortness of breath. 8 g 0  . amLODipine (NORVASC) 10 MG tablet Take 10 mg by mouth daily.    Marland Kitchen aspirin EC 81 MG tablet Take 81 mg by mouth daily.    Marland Kitchen b complex vitamins tablet Take 1 tablet by mouth daily.    . busPIRone (BUSPAR) 10 MG tablet Take 2 tablets (20 mg total) by mouth 2 (two) times daily. (Patient taking differently: Take 30 mg by mouth 2 (two) times daily. ) 360 tablet 1  . Cholecalciferol (D3-1000) 1000 units capsule Take 1,000 Units by mouth daily.    . D-Mannose 500 MG CAPS Take 500 mg by mouth 2 (two) times daily. WITH CRANBERRY & DANDELION EXTRACT    . esomeprazole (NEXIUM) 40 MG capsule TAKE 1 CAPSULE BY MOUTH EVERY MORNING 30 capsule 0  . estradiol (ESTRACE) 1 MG tablet Take 1 tablet (1 mg total) by mouth daily. 90 tablet 3  . famotidine (PEPCID) 40 MG tablet TAKE 1 TABLET BY MOUTH EVERY DAY IN THE EVENING 30 tablet 0  . FLUoxetine (PROZAC) 40 MG capsule Take 1 capsule (40 mg total) by mouth every morning. 90 capsule 1  . hydrochlorothiazide (MICROZIDE) 12.5 MG capsule TAKE 1 CAPSULE BY MOUTH EVERY DAY 90 capsule 1  . iron polysaccharides (FERREX 150) 150 MG capsule Take 1 capsule (150  mg total) by mouth 2 (two) times daily. 180 capsule 3  . Lactobacillus ([employer] PROBIOTIC ACIDOPHILUS PO) Take 1 capsule by mouth daily.    Marland Kitchen levothyroxine (SYNTHROID) 100 MCG tablet TAKE 1 TABLET (100 MCG TOTAL) BY MOUTH DAILY BEFORE BREAKFAST. 90 tablet 1  . mirtazapine (REMERON) 30 MG tablet Take 1 tablet (30 mg total) by mouth at bedtime. 90 tablet 1  . Multiple Vitamins-Minerals (MULTIVITAMIN WITH MINERALS) tablet Take 1 tablet by mouth daily.    . Omega-3 Fatty Acids (FISH OIL) 1200 MG CAPS Take 1 capsule by mouth.    . ondansetron (ZOFRAN) 4 MG tablet Take 4 mg by mouth every 8 (eight) hours as needed for nausea or vomiting.    . progesterone (PROMETRIUM) 100 MG capsule Take 100 mg by mouth at bedtime.    . SUMAtriptan (IMITREX) 100 MG tablet TAKE 1/2-1 TABLET BY MOUTH EVERY 2 HOURS AS NEEDED FOR HEADACHE 12 tablet 5  . triamterene-hydrochlorothiazide (MAXZIDE-25) 37.5-25 MG tablet Take 1 tablet by mouth daily. Annual appt due in June must see provider for future refills 90 tablet 0  . ALPRAZolam (XANAX) 0.5 MG tablet Take 1 tablet (0.5 mg total) by mouth 2 (two) times daily as needed for anxiety. Kramer  tablet 2  . amLODipine (NORVASC) 5 MG tablet Take 5 mg by mouth daily.    . carvedilol (COREG) 12.5 MG tablet Take 12.5 mg by mouth 2 (two) times daily.    . Cyanocobalamin (VITAMIN B-12) 1000 MCG SUBL Place 1 tablet (1,000 mcg total) under the tongue daily. (Patient taking differently: Place 2,000 mcg under the tongue daily. ) 100 tablet 3   No facility-administered medications prior to visit.    ROS: Review of Systems  Constitutional: Positive for fatigue. Negative for activity change, appetite change, chills and unexpected weight change.  HENT: Negative for congestion, mouth sores and sinus pressure.   Eyes: Negative for visual disturbance.  Respiratory: Negative for cough and chest tightness.   Cardiovascular: Positive for leg swelling.  Gastrointestinal: Positive for abdominal distention  and abdominal pain. Negative for anal bleeding, blood in stool, constipation and nausea.  Genitourinary: Positive for flank pain, frequency and urgency. Negative for difficulty urinating and vaginal pain.  Musculoskeletal: Positive for back pain. Negative for gait problem.  Skin: Negative for pallor and rash.  Neurological: Negative for dizziness, tremors, weakness, numbness and headaches.  Psychiatric/Behavioral: Negative for confusion and sleep disturbance.    Objective:  BP 134/86 (BP Location: Left Arm, Patient Position: Sitting, Cuff Size: Normal)   Pulse 69   Temp 98.1 F (36.7 C) (Oral)   Ht 5\' 2"  (1.575 m)   Wt 158 lb (71.7 kg)   SpO2 98%   BMI 28.90 kg/m   BP Readings from Last 3 Encounters:  08/27/19 134/86  07/25/19 (!) 142/94  05/27/19 132/83    Wt Readings from Last 3 Encounters:  08/27/19 158 lb (71.7 kg)  07/25/19 160 lb (72.6 kg)  05/17/19 158 lb (71.7 kg)    Physical Exam Constitutional:      General: She is not in acute distress.    Appearance: She is well-developed.  HENT:     Head: Normocephalic.     Right Ear: External ear normal.     Left Ear: External ear normal.     Nose: Nose normal.  Eyes:     General:        Right eye: No discharge.        Left eye: No discharge.     Conjunctiva/sclera: Conjunctivae normal.     Pupils: Pupils are equal, round, and reactive to light.  Neck:     Thyroid: No thyromegaly.     Vascular: No JVD.     Trachea: No tracheal deviation.  Cardiovascular:     Rate and Rhythm: Normal rate and regular rhythm.     Heart sounds: Normal heart sounds.  Pulmonary:     Effort: No respiratory distress.     Breath sounds: No stridor. No wheezing.  Abdominal:     General: Bowel sounds are normal. There is no distension.     Palpations: Abdomen is soft. There is no mass.     Tenderness: There is abdominal tenderness. There is no right CVA tenderness, left CVA tenderness, guarding or rebound.     Hernia: No hernia is  present.  Musculoskeletal:        General: Swelling present. No tenderness.     Cervical back: Normal range of motion and neck supple.  Lymphadenopathy:     Cervical: No cervical adenopathy.  Skin:    Findings: No erythema or rash.  Neurological:     Cranial Nerves: No cranial nerve deficit.     Motor: No abnormal muscle tone.  Coordination: Coordination normal.     Deep Tendon Reflexes: Reflexes normal.  Psychiatric:        Behavior: Behavior normal.        Thought Content: Thought content normal.        Judgment: Judgment normal.   B LE edema 1+ Abd distended, no mass, no rebound   A complex case  Lab Results  Component Value Date   WBC 6.8 10/18/2018   HGB 12.5 10/18/2018   HCT 35.8 (L) 10/18/2018   PLT 402.0 (H) 10/18/2018   GLUCOSE 86 10/18/2018   CHOL 263 (H) 10/18/2018   TRIG 271.0 (H) 10/18/2018   HDL 45.40 10/18/2018   LDLDIRECT 160.0 10/18/2018   LDLCALC UNABLE TO CALCULATE IF TRIGLYCERIDE OVER 400 mg/dL 08/26/2017   ALT 16 10/18/2018   AST 20 10/18/2018   NA 131 (L) 10/18/2018   K 4.5 10/18/2018   CL 98 10/18/2018   CREATININE 1.29 (H) 10/18/2018   BUN 24 (H) 10/18/2018   CO2 27 10/18/2018   TSH 0.32 (L) 10/18/2018   INR 0.98 08/26/2017   HGBA1C 5.5 06/27/2017    No results found.  Assessment & Plan:    Walker Kehr, MD

## 2019-08-27 NOTE — Assessment & Plan Note (Signed)
Abd CT w/contrast Creat proior

## 2019-08-27 NOTE — Assessment & Plan Note (Signed)
new- reduce Amlodipine back to 5 mg a day CT abd Labs Furosemide

## 2019-08-27 NOTE — Assessment & Plan Note (Signed)
Reduce Amlodipine back to 5 mg a day due to swelling

## 2019-08-27 NOTE — Assessment & Plan Note (Signed)
Labs

## 2019-08-27 NOTE — Assessment & Plan Note (Signed)
Worse - abd CT w/contrast

## 2019-08-27 NOTE — Patient Instructions (Signed)
Reduce Amlodipine back to 5 mg a day

## 2019-08-27 NOTE — Assessment & Plan Note (Signed)
New abd pain - abd CT w/contrast

## 2019-08-28 ENCOUNTER — Telehealth: Payer: Self-pay | Admitting: Cardiology

## 2019-08-28 ENCOUNTER — Other Ambulatory Visit: Payer: Medicare Other

## 2019-08-28 ENCOUNTER — Telehealth: Payer: Self-pay | Admitting: Internal Medicine

## 2019-08-28 ENCOUNTER — Other Ambulatory Visit: Payer: Self-pay | Admitting: Internal Medicine

## 2019-08-28 ENCOUNTER — Other Ambulatory Visit: Payer: Self-pay

## 2019-08-28 ENCOUNTER — Ambulatory Visit (HOSPITAL_COMMUNITY)
Admission: RE | Admit: 2019-08-28 | Discharge: 2019-08-28 | Disposition: A | Discharge status: Home / Self Care | Payer: Medicare Other | Source: Ambulatory Visit | Attending: Cardiovascular Disease | Admitting: Cardiovascular Disease

## 2019-08-28 DIAGNOSIS — M7989 Other specified soft tissue disorders: Secondary | ICD-10-CM

## 2019-08-28 DIAGNOSIS — Z20822 Contact with and (suspected) exposure to covid-19: Secondary | ICD-10-CM

## 2019-08-28 LAB — BASIC METABOLIC PANEL
BUN: 26 mg/dL — ABNORMAL HIGH (ref 6–23)
CO2: 24 mEq/L (ref 19–32)
Calcium: 10.1 mg/dL (ref 8.4–10.5)
Chloride: 102 mEq/L (ref 96–112)
Creatinine, Ser: 1.26 mg/dL — ABNORMAL HIGH (ref 0.40–1.20)
GFR: 42.03 mL/min — ABNORMAL LOW (ref >60.00)
Glucose, Bld: 79 mg/dL (ref 70–99)
Potassium: 3.7 mEq/L (ref 3.5–5.1)
Sodium: 135 mEq/L (ref 135–145)

## 2019-08-28 LAB — HEPATIC FUNCTION PANEL
ALT: 16 U/L (ref 0–35)
AST: 21 U/L (ref 0–37)
Albumin: 4.3 g/dL (ref 3.5–5.2)
Alkaline Phosphatase: 124 U/L — ABNORMAL HIGH (ref 39–117)
Bilirubin, Direct: 0 mg/dL (ref 0.0–0.3)
Total Bilirubin: 0.3 mg/dL (ref 0.2–1.2)
Total Protein: 7.6 g/dL (ref 6.0–8.3)

## 2019-08-28 LAB — URINALYSIS, ROUTINE W REFLEX MICROSCOPIC
Bilirubin Urine: NEGATIVE
Ketones, ur: NEGATIVE
Nitrite: POSITIVE — AB
Specific Gravity, Urine: 1.015 (ref 1.000–1.030)
Total Protein, Urine: NEGATIVE
Urine Glucose: NEGATIVE
Urobilinogen, UA: 0.2 (ref 0.0–1.0)
pH: 6 (ref 5.0–8.0)

## 2019-08-28 LAB — D-DIMER, QUANTITATIVE: D-Dimer, Quant: 0.51 mcg/mL FEU — ABNORMAL HIGH (ref <0.50)

## 2019-08-28 LAB — TSH: TSH: 1.2 u[IU]/mL (ref 0.35–4.50)

## 2019-08-28 NOTE — Telephone Encounter (Signed)
Pt states she is still having that cough and they had seen lung nodule in a previous scan. She is wondering if she should have a f/u CT scan on it and maybe get it scheduled with her other CT scan.  Please advise

## 2019-08-28 NOTE — Telephone Encounter (Signed)
Her symptoms are clearly related to the amlodipine and stopping the diuretic and I think I to refer her back to the nephrologist as they changed her medications.

## 2019-08-28 NOTE — Telephone Encounter (Signed)
Pt c/o swelling: STAT is pt has developed SOB within 24 hours  1) How much weight have you gained and in what time span? Has not gained weight  2) If swelling, where is the swelling located? Legs feet and abdomen   3) Are you currently taking a fluid pill? yes  4) Are you currently SOB? No, SOB on exertion  5) Do you have a log of your daily weights (if so, list)? no  6) Have you gained 3 pounds in a day or 5 pounds in a week? no  7) Have you traveled recently? No   Patient states she was been having swelling for the last week in her legs, feet, and abdomen. She states she also has pain in her lower back and pelvis, and has a chronic cough. She states yesterday her BP was 160/90. Please advise.

## 2019-08-28 NOTE — Addendum Note (Signed)
Addended by: Karren Cobble on: 08/28/2019 08:15 AM   Modules accepted: Orders

## 2019-08-28 NOTE — Telephone Encounter (Signed)
Spoke with pt and made her aware of recommendations.  Pt agreeable to contact Nephrologist.

## 2019-08-28 NOTE — Telephone Encounter (Signed)
Spoke with pt and she states that she has been having swelling in feet, legs and abdomen.  Had vascular study today to r/o DVT.  Test was negative.  Elevation improves edema but as soon as she gets up moving around, swelling returns.  BP yesterday was 160/90.  Pt took an extra HCTZ 12.5mg  tablet.  BP normal today.  Pt states that she only takes HCTZ 12.5mg  QD.  Nephrologist stopped her Furosemide and started her on Amlodipine 10mg  QD ~a month ago.  Pt also has Maxzide HCT on her list but states she didn't know she was suppose to be taking that.  Last echo was 2019.  Only other complaints are she has had a cough and gets SOB when exerting.  Will route to Dr. Bettina Gavia for review.

## 2019-08-29 LAB — SARS-COV-2 ANTIBODY(IGG)SPIKE,SEMI-QUANTITATIVE: SARS COV1 AB(IGG)SPIKE,SEMI QN: 12.8 index — ABNORMAL HIGH (ref <1.00)

## 2019-08-29 NOTE — Telephone Encounter (Signed)
Pt aware.

## 2019-08-29 NOTE — Telephone Encounter (Signed)
I do not see a report with the mentioning of a nodule in the lung on CT or a chest x-ray from the past couple -three years (except for a chronic granuloma).  I would rather not do chest CT at the moment, because I do not want to overload her with IV contrast. We can do a chest x-ray or a noncontrast CT scan later. Thanks, AP

## 2019-08-30 ENCOUNTER — Inpatient Hospital Stay: Admission: RE | Admit: 2019-08-30 | Payer: Medicare Other | Source: Ambulatory Visit

## 2019-08-31 ENCOUNTER — Other Ambulatory Visit: Payer: Self-pay

## 2019-08-31 ENCOUNTER — Ambulatory Visit (INDEPENDENT_AMBULATORY_CARE_PROVIDER_SITE_OTHER)
Admission: RE | Admit: 2019-08-31 | Discharge: 2019-08-31 | Disposition: A | Discharge status: Home / Self Care | Payer: Medicare Other | Source: Ambulatory Visit | Attending: Internal Medicine | Admitting: Internal Medicine

## 2019-08-31 DIAGNOSIS — R109 Unspecified abdominal pain: Secondary | ICD-10-CM | POA: Diagnosis not present

## 2019-08-31 DIAGNOSIS — N183 Chronic kidney disease, stage 3 unspecified: Secondary | ICD-10-CM

## 2019-08-31 DIAGNOSIS — C55 Malignant neoplasm of uterus, part unspecified: Secondary | ICD-10-CM

## 2019-08-31 DIAGNOSIS — C499 Malignant neoplasm of connective and soft tissue, unspecified: Secondary | ICD-10-CM

## 2019-08-31 DIAGNOSIS — R140 Abdominal distension (gaseous): Secondary | ICD-10-CM

## 2019-08-31 MED ORDER — IOHEXOL 300 MG/ML  SOLN
80.0000 mL | Freq: Once | INTRAMUSCULAR | Status: AC | PRN
  Administered 2019-08-31: 80 mL via INTRAVENOUS

## 2019-09-01 ENCOUNTER — Other Ambulatory Visit: Payer: Self-pay | Admitting: Cardiology

## 2019-09-03 ENCOUNTER — Other Ambulatory Visit: Payer: Self-pay | Admitting: Internal Medicine

## 2019-09-03 ENCOUNTER — Ambulatory Visit: Payer: Medicare Other | Admitting: Psychology

## 2019-09-03 DIAGNOSIS — C499 Malignant neoplasm of connective and soft tissue, unspecified: Secondary | ICD-10-CM

## 2019-09-03 DIAGNOSIS — N200 Calculus of kidney: Secondary | ICD-10-CM

## 2019-09-03 DIAGNOSIS — R1900 Intra-abdominal and pelvic swelling, mass and lump, unspecified site: Secondary | ICD-10-CM

## 2019-09-03 DIAGNOSIS — Q6211 Congenital occlusion of ureteropelvic junction: Secondary | ICD-10-CM

## 2019-09-03 NOTE — Telephone Encounter (Signed)
Urine culture was unable to be added on, do you want pt to repeat sample?

## 2019-09-04 ENCOUNTER — Inpatient Hospital Stay (HOSPITAL_COMMUNITY): Payer: Medicare Other | Admitting: Certified Registered Nurse Anesthetist

## 2019-09-04 ENCOUNTER — Inpatient Hospital Stay (HOSPITAL_COMMUNITY): Payer: Medicare Other

## 2019-09-04 ENCOUNTER — Encounter (HOSPITAL_COMMUNITY)
Admission: AD | Disposition: A | Discharge status: Home / Self Care | Payer: Self-pay | Source: Home / Self Care | Attending: Urology

## 2019-09-04 ENCOUNTER — Other Ambulatory Visit: Payer: Self-pay | Admitting: Urology

## 2019-09-04 ENCOUNTER — Inpatient Hospital Stay (HOSPITAL_COMMUNITY)
Admission: AD | Admit: 2019-09-04 | Discharge: 2019-09-06 | DRG: 661 | Disposition: A | Discharge status: Home / Self Care | Payer: Medicare Other | Attending: Urology | Admitting: Urology

## 2019-09-04 ENCOUNTER — Encounter (HOSPITAL_COMMUNITY): Payer: Self-pay | Admitting: Urology

## 2019-09-04 DIAGNOSIS — N390 Urinary tract infection, site not specified: Secondary | ICD-10-CM | POA: Diagnosis not present

## 2019-09-04 DIAGNOSIS — N202 Calculus of kidney with calculus of ureter: Secondary | ICD-10-CM | POA: Diagnosis not present

## 2019-09-04 DIAGNOSIS — J45909 Unspecified asthma, uncomplicated: Secondary | ICD-10-CM | POA: Diagnosis present

## 2019-09-04 DIAGNOSIS — Z885 Allergy status to narcotic agent status: Secondary | ICD-10-CM

## 2019-09-04 DIAGNOSIS — N132 Hydronephrosis with renal and ureteral calculous obstruction: Principal | ICD-10-CM | POA: Diagnosis present

## 2019-09-04 DIAGNOSIS — Z8542 Personal history of malignant neoplasm of other parts of uterus: Secondary | ICD-10-CM

## 2019-09-04 DIAGNOSIS — Z888 Allergy status to other drugs, medicaments and biological substances status: Secondary | ICD-10-CM

## 2019-09-04 DIAGNOSIS — Z8249 Family history of ischemic heart disease and other diseases of the circulatory system: Secondary | ICD-10-CM | POA: Diagnosis not present

## 2019-09-04 DIAGNOSIS — Z981 Arthrodesis status: Secondary | ICD-10-CM | POA: Diagnosis not present

## 2019-09-04 DIAGNOSIS — E039 Hypothyroidism, unspecified: Secondary | ICD-10-CM | POA: Diagnosis present

## 2019-09-04 DIAGNOSIS — N201 Calculus of ureter: Secondary | ICD-10-CM | POA: Diagnosis not present

## 2019-09-04 DIAGNOSIS — Z833 Family history of diabetes mellitus: Secondary | ICD-10-CM

## 2019-09-04 DIAGNOSIS — K219 Gastro-esophageal reflux disease without esophagitis: Secondary | ICD-10-CM | POA: Diagnosis present

## 2019-09-04 DIAGNOSIS — Z9221 Personal history of antineoplastic chemotherapy: Secondary | ICD-10-CM | POA: Diagnosis not present

## 2019-09-04 DIAGNOSIS — R8271 Bacteriuria: Secondary | ICD-10-CM | POA: Diagnosis not present

## 2019-09-04 DIAGNOSIS — B9689 Other specified bacterial agents as the cause of diseases classified elsewhere: Secondary | ICD-10-CM | POA: Diagnosis not present

## 2019-09-04 DIAGNOSIS — Z20822 Contact with and (suspected) exposure to covid-19: Secondary | ICD-10-CM | POA: Diagnosis present

## 2019-09-04 DIAGNOSIS — Z8261 Family history of arthritis: Secondary | ICD-10-CM

## 2019-09-04 DIAGNOSIS — Z881 Allergy status to other antibiotic agents status: Secondary | ICD-10-CM

## 2019-09-04 DIAGNOSIS — I10 Essential (primary) hypertension: Secondary | ICD-10-CM | POA: Diagnosis not present

## 2019-09-04 DIAGNOSIS — D638 Anemia in other chronic diseases classified elsewhere: Secondary | ICD-10-CM | POA: Diagnosis not present

## 2019-09-04 DIAGNOSIS — E785 Hyperlipidemia, unspecified: Secondary | ICD-10-CM | POA: Diagnosis not present

## 2019-09-04 HISTORY — PX: CYSTOSCOPY W/ URETERAL STENT PLACEMENT: SHX1429

## 2019-09-04 HISTORY — DX: Hydronephrosis with renal and ureteral calculous obstruction: N13.2

## 2019-09-04 HISTORY — DX: Personal history of urinary calculi: Z87.442

## 2019-09-04 LAB — RESPIRATORY PANEL BY RT PCR (FLU A&B, COVID)
Influenza A by PCR: NEGATIVE
Influenza B by PCR: NEGATIVE
SARS Coronavirus 2 by RT PCR: NEGATIVE

## 2019-09-04 LAB — CBC
HCT: 33.7 % — ABNORMAL LOW (ref 36.0–46.0)
Hemoglobin: 11.3 g/dL — ABNORMAL LOW (ref 12.0–15.0)
MCH: 31.7 pg (ref 26.0–34.0)
MCHC: 33.5 g/dL (ref 30.0–36.0)
MCV: 94.4 fL (ref 80.0–100.0)
Platelets: 312 10*3/uL (ref 150–400)
RBC: 3.57 MIL/uL — ABNORMAL LOW (ref 3.87–5.11)
RDW: 13.4 % (ref 11.5–15.5)
WBC: 13.5 10*3/uL — ABNORMAL HIGH (ref 4.0–10.5)
nRBC: 0 % (ref 0.0–0.2)

## 2019-09-04 LAB — BASIC METABOLIC PANEL
Anion gap: 9 (ref 5–15)
BUN: 19 mg/dL (ref 8–23)
CO2: 26 mmol/L (ref 22–32)
Calcium: 9.9 mg/dL (ref 8.9–10.3)
Chloride: 99 mmol/L (ref 98–111)
Creatinine, Ser: 1.44 mg/dL — ABNORMAL HIGH (ref 0.44–1.00)
GFR calc Af Amer: 43 mL/min — ABNORMAL LOW (ref >60)
GFR calc non Af Amer: 37 mL/min — ABNORMAL LOW (ref >60)
Glucose, Bld: 101 mg/dL — ABNORMAL HIGH (ref 70–99)
Potassium: 3.8 mmol/L (ref 3.5–5.1)
Sodium: 134 mmol/L — ABNORMAL LOW (ref 135–145)

## 2019-09-04 SURGERY — CYSTOSCOPY, WITH RETROGRADE PYELOGRAM AND URETERAL STENT INSERTION
Anesthesia: General | Laterality: Bilateral

## 2019-09-04 MED ORDER — FLUOXETINE HCL 20 MG PO CAPS
40.0000 mg | ORAL_CAPSULE | Freq: Every morning | ORAL | Status: DC
  Administered 2019-09-05 – 2019-09-06 (×2): 40 mg via ORAL
  Filled 2019-09-04 (×2): qty 2

## 2019-09-04 MED ORDER — ONDANSETRON HCL 4 MG/2ML IJ SOLN
INTRAMUSCULAR | Status: DC | PRN
  Administered 2019-09-04: 4 mg via INTRAVENOUS

## 2019-09-04 MED ORDER — SODIUM CHLORIDE 0.9 % IR SOLN
Status: DC | PRN
  Administered 2019-09-04: 3000 mL

## 2019-09-04 MED ORDER — PANTOPRAZOLE SODIUM 40 MG PO TBEC
40.0000 mg | DELAYED_RELEASE_TABLET | Freq: Every day | ORAL | Status: DC
  Administered 2019-09-05 – 2019-09-06 (×2): 40 mg via ORAL
  Filled 2019-09-04 (×2): qty 1

## 2019-09-04 MED ORDER — ACETAMINOPHEN 10 MG/ML IV SOLN: 1000.0000 mg | Freq: Once | INTRAVENOUS | Status: DC | PRN

## 2019-09-04 MED ORDER — BUSPIRONE HCL 10 MG PO TABS
30.0000 mg | ORAL_TABLET | Freq: Two times a day (BID) | ORAL | Status: DC
  Administered 2019-09-04 – 2019-09-06 (×4): 30 mg via ORAL
  Filled 2019-09-04: qty 6
  Filled 2019-09-04 (×2): qty 3
  Filled 2019-09-04: qty 6
  Filled 2019-09-04: qty 3
  Filled 2019-09-04: qty 6
  Filled 2019-09-04: qty 3

## 2019-09-04 MED ORDER — MIRTAZAPINE 15 MG PO TABS
30.0000 mg | ORAL_TABLET | Freq: Every day | ORAL | Status: DC
  Administered 2019-09-04 – 2019-09-05 (×2): 30 mg via ORAL
  Filled 2019-09-04 (×2): qty 2

## 2019-09-04 MED ORDER — LACTATED RINGERS IV SOLN: INTRAVENOUS | Status: DC

## 2019-09-04 MED ORDER — ALBUTEROL SULFATE (2.5 MG/3ML) 0.083% IN NEBU: 2.5000 mg | INHALATION_SOLUTION | Freq: Four times a day (QID) | RESPIRATORY_TRACT | Status: DC | PRN

## 2019-09-04 MED ORDER — HYDROMORPHONE HCL 1 MG/ML IJ SOLN: 0.5000 mg | INTRAMUSCULAR | Status: DC | PRN

## 2019-09-04 MED ORDER — LIDOCAINE HCL (CARDIAC) PF 50 MG/5ML IV SOSY
PREFILLED_SYRINGE | INTRAVENOUS | Status: DC | PRN
  Administered 2019-09-04: 75 mg via INTRAVENOUS

## 2019-09-04 MED ORDER — DEXAMETHASONE SODIUM PHOSPHATE 10 MG/ML IJ SOLN
INTRAMUSCULAR | Status: DC | PRN
  Administered 2019-09-04: 10 mg via INTRAVENOUS

## 2019-09-04 MED ORDER — LIDOCAINE 2% (20 MG/ML) 5 ML SYRINGE
INTRAMUSCULAR | Status: AC
  Filled 2019-09-04: qty 5

## 2019-09-04 MED ORDER — FENTANYL CITRATE (PF) 100 MCG/2ML IJ SOLN
INTRAMUSCULAR | Status: DC | PRN
  Administered 2019-09-04 (×2): 50 ug via INTRAVENOUS

## 2019-09-04 MED ORDER — LEVOTHYROXINE SODIUM 100 MCG PO TABS
100.0000 ug | ORAL_TABLET | Freq: Every day | ORAL | Status: DC
  Administered 2019-09-05 – 2019-09-06 (×2): 100 ug via ORAL
  Filled 2019-09-04 (×2): qty 1

## 2019-09-04 MED ORDER — FENTANYL CITRATE (PF) 100 MCG/2ML IJ SOLN
INTRAMUSCULAR | Status: AC
  Filled 2019-09-04: qty 2

## 2019-09-04 MED ORDER — ONDANSETRON HCL 4 MG/2ML IJ SOLN
INTRAMUSCULAR | Status: AC
  Filled 2019-09-04: qty 2

## 2019-09-04 MED ORDER — PROPOFOL 10 MG/ML IV BOLUS
INTRAVENOUS | Status: DC | PRN
  Administered 2019-09-04: 200 mg via INTRAVENOUS

## 2019-09-04 MED ORDER — SODIUM CHLORIDE 0.9 % IV SOLN: INTRAVENOUS | Status: DC

## 2019-09-04 MED ORDER — SENNOSIDES-DOCUSATE SODIUM 8.6-50 MG PO TABS
1.0000 | ORAL_TABLET | Freq: Two times a day (BID) | ORAL | Status: DC
  Administered 2019-09-04 – 2019-09-06 (×4): 1 via ORAL
  Filled 2019-09-04 (×4): qty 1

## 2019-09-04 MED ORDER — MIDAZOLAM HCL 2 MG/2ML IJ SOLN
INTRAMUSCULAR | Status: AC
  Filled 2019-09-04: qty 2

## 2019-09-04 MED ORDER — OXYCODONE HCL 5 MG PO TABS
5.0000 mg | ORAL_TABLET | ORAL | Status: DC | PRN
  Administered 2019-09-04 – 2019-09-05 (×3): 5 mg via ORAL
  Filled 2019-09-04 (×3): qty 1

## 2019-09-04 MED ORDER — FENTANYL CITRATE (PF) 100 MCG/2ML IJ SOLN: 25.0000 ug | INTRAMUSCULAR | Status: DC | PRN

## 2019-09-04 MED ORDER — LACTATED RINGERS IV SOLN: INTRAVENOUS | Status: DC | PRN

## 2019-09-04 MED ORDER — SODIUM CHLORIDE 0.9 % IV SOLN
1.0000 g | INTRAVENOUS | Status: DC
  Administered 2019-09-04 – 2019-09-05 (×2): 1 g via INTRAVENOUS
  Filled 2019-09-04: qty 1
  Filled 2019-09-04 (×2): qty 10

## 2019-09-04 MED ORDER — IOHEXOL 300 MG/ML  SOLN
INTRAMUSCULAR | Status: DC | PRN
  Administered 2019-09-04: 10 mL

## 2019-09-04 MED ORDER — ONDANSETRON HCL 4 MG/2ML IJ SOLN: 4.0000 mg | Freq: Once | INTRAMUSCULAR | Status: DC | PRN

## 2019-09-04 MED ORDER — AMLODIPINE BESYLATE 10 MG PO TABS
10.0000 mg | ORAL_TABLET | Freq: Every day | ORAL | Status: DC
  Administered 2019-09-04: 10 mg via ORAL
  Filled 2019-09-04 (×3): qty 1

## 2019-09-04 MED ORDER — MIDAZOLAM HCL 5 MG/5ML IJ SOLN
INTRAMUSCULAR | Status: DC | PRN
  Administered 2019-09-04 (×2): 1 mg via INTRAVENOUS

## 2019-09-04 MED ORDER — ALBUTEROL SULFATE HFA 108 (90 BASE) MCG/ACT IN AERS: 1.0000 | INHALATION_SPRAY | Freq: Four times a day (QID) | RESPIRATORY_TRACT | Status: DC | PRN

## 2019-09-04 MED ORDER — FAMOTIDINE 20 MG PO TABS
40.0000 mg | ORAL_TABLET | Freq: Every day | ORAL | Status: DC
  Administered 2019-09-04 – 2019-09-05 (×2): 40 mg via ORAL
  Filled 2019-09-04 (×2): qty 2

## 2019-09-04 MED ORDER — SODIUM CHLORIDE 0.9 % IV SOLN
1.0000 g | INTRAVENOUS | Status: DC
  Filled 2019-09-04: qty 10

## 2019-09-04 MED ORDER — ASPIRIN EC 81 MG PO TBEC
81.0000 mg | DELAYED_RELEASE_TABLET | Freq: Every day | ORAL | Status: DC
  Administered 2019-09-05 – 2019-09-06 (×2): 81 mg via ORAL
  Filled 2019-09-04 (×2): qty 1

## 2019-09-04 MED ORDER — PROPOFOL 10 MG/ML IV BOLUS
INTRAVENOUS | Status: AC
  Filled 2019-09-04: qty 20

## 2019-09-04 SURGICAL SUPPLY — 15 items
BAG URO CATCHER STRL LF (MISCELLANEOUS) ×2 IMPLANT
BASKET ZERO TIP NITINOL 2.4FR (BASKET) IMPLANT
BSKT STON RTRVL ZERO TP 2.4FR (BASKET)
CATH INTERMIT  6FR 70CM (CATHETERS) IMPLANT
CLOTH BEACON ORANGE TIMEOUT ST (SAFETY) ×2 IMPLANT
GLOVE BIOGEL M STRL SZ7.5 (GLOVE) ×2 IMPLANT
GOWN STRL REUS W/TWL LRG LVL3 (GOWN DISPOSABLE) ×2 IMPLANT
GUIDEWIRE ANG ZIPWIRE 038X150 (WIRE) ×2 IMPLANT
GUIDEWIRE STR DUAL SENSOR (WIRE) IMPLANT
KIT TURNOVER KIT A (KITS) IMPLANT
MANIFOLD NEPTUNE II (INSTRUMENTS) ×2 IMPLANT
STENT POLARIS 5FRX24 (STENTS) ×2 IMPLANT
TRAY CYSTO PACK (CUSTOM PROCEDURE TRAY) ×2 IMPLANT
TUBING CONNECTING 10 (TUBING) ×2 IMPLANT
TUBING UROLOGY SET (TUBING) IMPLANT

## 2019-09-04 NOTE — Op Note (Signed)
NAME: [name] RECORD [id number] ACCOUNT [id number] DATE OF BIRTH:[birth year] FACILITY: [facility] LOCATION: [facility] PHYSICIAN:Bob Daversa Tresa Moore, MD  OPERATIVE REPORT  DATE OF PROCEDURE:  09/04/2019  SURGEON:  Alexis Frock MD  PREOPERATIVE DIAGNOSES:   1.  Bilateral ureteral stones. 2.  Bacteriuria.  PROCEDURE PERFORMED: 1.  Cystoscopy with bilateral pyelograms, interpretation. 2.  Insertion of bilateral ureteral stents, 5 x 24 Polaris, no tether.  ESTIMATED BLOOD LOSS:  Nil.  COMPLICATIONS:  None.  SPECIMEN:  Urine for Gram stain and culture.  FINDINGS: 1.  Left mid ureteral stone with proximal hydronephrosis. 2.  Right distal ureteral stone with proximal hydronephrosis. 3.  Successful placement of bilateral ureteral stents, proximal end in the renal pelvis, distal end in urinary bladder.  INDICATIONS:  The patient is a very pleasant 69 year old woman who was found on workup of flank pain and malaise to have bilateral ureteral stones several days ago.  She has since had progression of her malaise, flank pain, as well as some fevers up to  nearly 101.  She was seen in the office earlier today and immediately upon reviewing her clinical situation, it was felt that she needed urgent renal decompression.  Options were discussed, including recommended path of stenting today, followed by  inpatient admission for clearance of infectious parameters.  This was presented and she wanted to proceed.  Informed consent was obtained and placed in the medical record.  DESCRIPTION OF PROCEDURE:  The patient being identified, the procedure being cystoscopy, bilateral stent placement confirmed.  Procedure timeout was performed.  Intravenous antibiotics administered.  General LMA anesthesia induced.  The patient was  placed into a low lithotomy position, sterile field was created, prepping and draping the vagina, introitus, and proximal thighs using iodine.  Cystourethroscopy was  then performed using a 21-French rigid cystoscope with offset lens.  Inspection of the  urethra revealed changes consistent with prior sling.  The urinary bladder was somewhat edematous, consistent with cystitis.  Urine was somewhat foul.  Specimen of urine was set aside for Gram stain and culture.  Next, the left ureteral orifice was  cannulated with a 6-French renal catheter and left retrograde pyelogram was obtained.  Left retrograde pyelogram demonstrated a single left ureter, a single system left kidney.  There was a filling defect in the proximal ureter consistent with known stone.  A 0.03 ZIPwire was advanced to lower pole over which a new 5 x 24 Polaris-type  stent was placed using cystoscopic and fluoroscopic guidance.  Good proximal and distal planes were noted.  Similarly, right retrograde pyelogram was obtained.  Right retrograde pyelogram demonstrated a single right ureter, a single system right kidney.  There was narrowing in the distal ureter consistent with known stone, mild hydronephrosis above this.  A 0.03 ZIPwire was advanced to the lower pole, over which  a separate new 5 x 24 Polaris-type stent was placed using cystoscopic and fluoroscopic guidance.  Good proximal and distal planes were noted.  A 16-French Foley catheter was placed free to straight drain.  Procedure was terminated.  The patient was  well.  No immediate perioperative complications.  The patient was taken to the postanesthesia care unit in stable condition.  We will plan for inpatient admission.  VN/NUANCE  D:09/04/2019 T:09/04/2019 JOB:[id number]

## 2019-09-04 NOTE — Anesthesia Postprocedure Evaluation (Signed)
Anesthesia Post Note  Patient: [name]  Procedure(s) Performed: CYSTOSCOPY WITH RETROGRADE PYELOGRAM/URETERAL STENT PLACEMENT (Bilateral )     Patient location during evaluation: PACU Anesthesia Type: General Level of consciousness: awake and alert Pain management: pain level controlled Vital Signs Assessment: post-procedure vital signs reviewed and stable Respiratory status: spontaneous breathing, nonlabored ventilation, respiratory function stable and patient connected to nasal cannula oxygen Cardiovascular status: blood pressure returned to baseline and stable Postop Assessment: no apparent nausea or vomiting Anesthetic complications: no    Last Vitals:  Vitals:   09/04/19 1930 09/04/19 1957  BP: 100/60 (!) 118/96  Pulse: 73 72  Resp: 16 18  Temp: 36.7 C (!) 36.4 C  SpO2: 98% 100%    Last Pain:  Vitals:   09/04/19 1957  TempSrc: Oral  PainSc:                  [name]

## 2019-09-04 NOTE — Anesthesia Preprocedure Evaluation (Addendum)
Anesthesia Evaluation  Patient identified by MRN, date of birth, ID band Patient awake    Reviewed: Allergy & Precautions, NPO status , Patient's Chart, lab work & pertinent test results  Airway Mallampati: II  TM Distance: >3 FB Neck ROM: Full    Dental no notable dental hx.    Pulmonary asthma (mild. controlled) ,    Pulmonary exam normal breath sounds clear to auscultation       Cardiovascular hypertension, Pt. on medications Normal cardiovascular exam Rhythm:Regular Rate:Normal  ECG: SR, rate 65   Neuro/Psych  Headaches, PSYCHIATRIC DISORDERS Anxiety Depression Bipolar Disorder Dementia    GI/Hepatic Neg liver ROS, GERD  Medicated and Controlled,IBS (irritable bowel syndrome)   Endo/Other  Hypothyroidism   Renal/GU Renal disease     Musculoskeletal  (+) Fibromyalgia -  Abdominal   Peds  Hematology HLD   Anesthesia Other Findings BILATERAL URETERAL STONES, BACTURIA  Reproductive/Obstetrics                           Anesthesia Physical Anesthesia Plan  ASA: III  Anesthesia Plan: General   Post-op Pain Management:    Induction: Intravenous  PONV Risk Score and Plan: 4 or greater and Ondansetron, Dexamethasone, Midazolam and Treatment may vary due to age or medical condition  Airway Management Planned:   Additional Equipment:   Intra-op Plan:   Post-operative Plan: Extubation in OR  Informed Consent: I have reviewed the patients History and Physical, chart, labs and discussed the procedure including the risks, benefits and alternatives for the proposed anesthesia with the patient or authorized representative who has indicated his/her understanding and acceptance.     Dental advisory given  Plan Discussed with: CRNA  Anesthesia Plan Comments:       Anesthesia Quick Evaluation

## 2019-09-04 NOTE — Anesthesia Procedure Notes (Signed)
Procedure Name: LMA Insertion Date/Time: 09/04/2019 5:49 PM Performed by: Lissa Morales, CRNA Pre-anesthesia Checklist: Patient identified, Emergency Drugs available, Suction available and Patient being monitored Patient Re-evaluated:Patient Re-evaluated prior to induction Oxygen Delivery Method: Circle system utilized Preoxygenation: Pre-oxygenation with 100% oxygen Induction Type: IV induction LMA: LMA with gastric port inserted LMA Size: 4.0 Tube type: Oral Number of attempts: 1 Airway Equipment and Method: Stylet and Oral airway Placement Confirmation: positive ETCO2 Tube secured with: Tape Dental Injury: Teeth and Oropharynx as per pre-operative assessment

## 2019-09-04 NOTE — Brief Op Note (Signed)
09/04/2019  6:08 PM  PATIENT:  [name]  69 y.o. female  PRE-OPERATIVE DIAGNOSIS:  BILATERAL URETERAL STONES, BACTURIA  POST-OPERATIVE DIAGNOSIS:  BILATERAL URETERAL STONES, BACTURIA  PROCEDURE:  Procedure(s): CYSTOSCOPY WITH RETROGRADE PYELOGRAM/URETERAL STENT PLACEMENT (Bilateral)  SURGEON:  Surgeon(s) and Role:    * Alexis Frock, MD - Primary  PHYSICIAN ASSISTANT:   ASSISTANTS: none   ANESTHESIA:   general  EBL:  0 mL   BLOOD ADMINISTERED:none  DRAINS: 66F foley to gravity   LOCAL MEDICATIONS USED:  NONE  SPECIMEN:  Source of Specimen:  urine for gram stain and CX  DISPOSITION OF SPECIMEN:  microbiology  COUNTS:  YES  TOURNIQUET:  * No tourniquets in log *  DICTATION: .Other Dictation: Dictation Number [id number]  PLAN OF CARE: Admit to inpatient   PATIENT DISPOSITION:  PACU - hemodynamically stable.   Delay start of Pharmacological VTE agent (>24hrs) due to surgical blood loss or risk of bleeding: not applicable

## 2019-09-04 NOTE — Transfer of Care (Signed)
Immediate Anesthesia Transfer of Care Note  Patient: [name]  Procedure(s) Performed: CYSTOSCOPY WITH RETROGRADE PYELOGRAM/URETERAL STENT PLACEMENT (Bilateral )  Patient Location: PACU  Anesthesia Type:General  Level of Consciousness: awake, alert , oriented and patient cooperative  Airway & Oxygen Therapy: Patient Spontanous Breathing and Patient connected to face mask oxygen  Post-op Assessment: Report given to RN, Post -op Vital signs reviewed and stable and Patient moving all extremities X 4  Post vital signs: stable  Last Vitals:  Vitals Value Taken Time  BP 116/61 09/04/19 1820  Temp 37 C 09/04/19 1820  Pulse 69 09/04/19 1825  Resp 12 09/04/19 1825  SpO2 100 % 09/04/19 1825  Vitals shown include unvalidated device data.  Last Pain:  Vitals:   09/04/19 1820  TempSrc:   PainSc: 0-No pain      Patients Stated Pain Goal: 3 (61/60/73 7106)  Complications: No apparent anesthesia complications

## 2019-09-04 NOTE — H&P (Signed)
[name] [name] is an 69 y.o. female.    Chief Complaint: Pre-OP BILATERAL Ureteral Stent Placement  HPI:   1 - BILATERAL Ureteral Stones - left 33mm and Rt 78mm ureteral stones as well as large left renal stone by CT 4/16 on eval abd pain. Cr 1.4 on 4/12.  2 - Obstructing Pyelonephritis - fevers, malaise, larve bacteruria x few days 08/2019 and sig bacteruria on UA. Most recnet prior CX 2021 Citrobacter sens rocephin.  Today "[name]" is seen for urgent renal decompression for bilateral ureteral stones and pyelo symptoms. Last solid food yesterday. About 6ox water 1pm.   Past Medical History:  Diagnosis Date  . Abdominal pain, generalized 03/26/2014  . Acute upper respiratory infection 05/05/2016   12/17  . Adaptive colitis 12/05/2013  . Adult hypothyroidism 12/05/2013   On Levothroid  . Allergic rhinitis, cause unspecified   . Anemia 05/31/2017   Iron def  . Anxiety   . Asthma    chronic cough  . Bipolar I disorder, most recent episode (or current) depressed, severe, without mention of psychotic behavior   . Blood transfusion without reported diagnosis   . 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 Cephalalgia 12/05/2013  . 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 Chest pain, atypical 07/13/2017   2/19 CT abd/chest - ok in June 2018  . Chronic depression 10/13/2016  . Cold intolerance 03/26/2014  . Concussion w/o coma 09/18/2014  . Cough variant asthma  vs UACS/ irritable larynx  03/26/2014   FENO 08/11/2016  =   17 p no rx    . Deep phlebothrombosis, postpartum 02/14/2008   Overview:  Overview:  Annotation: affected mostly left leg. Qualifier: Diagnosis of  By: Linda Hedges MD, Heinz Knuckles   . Deep phlebothrombosis, postpartum, unspecified as to episode of care(671.40)   . Depression   . Feeling bilious 12/05/2013  . Female genuine stress incontinence 04/12/2012  . Fibromyalgia   . Gastritis   . GERD 03/04/2007   Protonix   . GERD (gastroesophageal reflux disease)   . HTN  (hypertension) 05/31/2017   1/19 Verapamil bid 2/19 added Maxzide  . Hyperlipidemia   . IBS (irritable bowel syndrome)   . 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 Irritable bowel syndrome with diarrhea 09/12/2014   Dr Olevia Perches  . IRRITABLE BOWEL SYNDROME, HX OF 03/04/2007   Qualifier: Diagnosis of  By: Quentin Cornwall CMA, Janett Billow    . Laryngopharyngeal reflux (LPR) 06/15/2016  . Leg pain, anterior, left 12/15/2016   8/18 2/19 L  . 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 Lung mass 09/10/2013  . Lung nodule, solitary 09/10/2013  . Malaise and fatigue 04/16/2015  . Menopause 12/05/2013  . Migraine headache   . Mild cognitive disorder 12/05/2013  . MUSCLE PAIN 07/03/2008   Qualifier: Diagnosis of  By: Jenny Reichmann MD, Hunt Oris   . Other and unspecified hyperlipidemia   . Parathyroid adenoma 2014  . Paresthesia 12/15/2016   8/18 L lat foot - ?peroneal nerve damage  . Positive skin test 03/26/2014  . Post concussion syndrome 10/28/2014  . Severe episode of recurrent major depressive disorder, without psychotic features ([place]) 12/18/2014   On Fluoxetine   . Status post chemotherapy 2014  . Syncope 10/13/2016  . Unspecified hypothyroidism 04/25/2013  . Vitamin D deficiency 04/25/2013    Past Surgical History:  Procedure Laterality Date  . ANTERIOR FUSION  CERVICAL SPINE  2006  . BLADDER SURGERY  06/2016   [facility], piece of mesh with necrotic tissue removed  . CATARACT EXTRACTION, BILATERAL  2019  . CESAREAN SECTION  1986  . CHOLECYSTECTOMY N/A 05/03/2013   Procedure: LAPAROSCOPIC CHOLECYSTECTOMY WITH INTRAOPERATIVE CHOLANGIOGRAM;  Surgeon: Odis Hollingshead, MD;  Location: [facility];  Service: General;  Laterality: N/A;  . COLONOSCOPY    . herniated discectomy  2019  . laprotomy  04/2012   for cancer  . NECK SURGERY  2006   C spine fusion  . POSTERIOR FUSION CERVICAL SPINE  2016  . PUBOVAGINAL SLING    .  RIGHT/LEFT HEART CATH AND CORONARY ANGIOGRAPHY N/A 08/26/2017   Procedure: RIGHT/LEFT HEART CATH AND CORONARY ANGIOGRAPHY;  Surgeon: Burnell Blanks, MD;  Location: [facility];  Service: Cardiovascular;  Laterality: N/A;  . VAGINAL HYSTERECTOMY      Family History  Problem Relation Age of Onset  . Coronary artery disease Father   . Heart disease Father   . Hypertension Father   . Irritable bowel syndrome Father   . Arthritis Mother   . Hypertension Mother   . Migraines Mother   . Heart disease Mother   . Diabetes Maternal Grandmother   . Allergies Maternal Grandmother   . Irritable bowel syndrome Sister   . Irritable bowel syndrome Brother   . Colon cancer Neg Hx   . Esophageal cancer Neg Hx   . Rectal cancer Neg Hx   . Stomach cancer Neg Hx    Social History:  reports that she has never smoked. She has never used smokeless tobacco. She reports previous alcohol use of about 2.0 - 3.0 standard drinks of alcohol per week. She reports that she does not use drugs.  Allergies:  Allergies  Allergen Reactions  . Buprenorphine Hcl Other (See Comments)    Severe headache, confusion  . Morphine And Related Other (See Comments)    Severe headache  . Keflex [Cephalexin] Nausea And Vomiting  . Other Other (See Comments)    Trees & Grass = allergic symptoms  . Codeine Other (See Comments)    Disorientation  . Dust Mite Extract Cough  . Molds & Smuts Cough    No medications prior to admission.    No results found for this or any previous visit (from the past 48 hour(s)). No results found.  Review of Systems  Constitutional: Positive for chills, fatigue and fever.  HENT: Negative.   Respiratory: Negative.   Genitourinary: Positive for flank pain.  All other systems reviewed and are negative.   There were no vitals taken for this visit. Physical Exam  Constitutional: She appears well-developed.  HENT:  Head: Normocephalic.  Eyes: Pupils are equal, round, and  reactive to light.  Cardiovascular: Normal rate.  Respiratory: Effort normal.  GI: Soft.  Genitourinary:    Genitourinary Comments: Mild CVAT at present.    Neurological: She is alert.  Skin: Skin is warm.  Psychiatric: She has a normal mood and affect.     Assessment/Plan  Proceed as planned with BILATERAL ureteral stent placement today. Stones will then need to be managed with likely staged ureteroscopy in elective setting in few weeks after clears infections parameters.  IV rocephin, IVF, serial labs, verify fever free x 24 hours before discharge.   Alexis Frock, MD 09/04/2019, 3:54 PM

## 2019-09-05 ENCOUNTER — Other Ambulatory Visit: Payer: Self-pay

## 2019-09-05 LAB — CBC
HCT: 34.6 % — ABNORMAL LOW (ref 36.0–46.0)
Hemoglobin: 11.4 g/dL — ABNORMAL LOW (ref 12.0–15.0)
MCH: 31.5 pg (ref 26.0–34.0)
MCHC: 32.9 g/dL (ref 30.0–36.0)
MCV: 95.6 fL (ref 80.0–100.0)
Platelets: 299 10*3/uL (ref 150–400)
RBC: 3.62 MIL/uL — ABNORMAL LOW (ref 3.87–5.11)
RDW: 13.3 % (ref 11.5–15.5)
WBC: 14.2 10*3/uL — ABNORMAL HIGH (ref 4.0–10.5)
nRBC: 0 % (ref 0.0–0.2)

## 2019-09-05 LAB — BASIC METABOLIC PANEL
Anion gap: 11 (ref 5–15)
BUN: 23 mg/dL (ref 8–23)
CO2: 23 mmol/L (ref 22–32)
Calcium: 10 mg/dL (ref 8.9–10.3)
Chloride: 105 mmol/L (ref 98–111)
Creatinine, Ser: 1.37 mg/dL — ABNORMAL HIGH (ref 0.44–1.00)
GFR calc Af Amer: 45 mL/min — ABNORMAL LOW (ref >60)
GFR calc non Af Amer: 39 mL/min — ABNORMAL LOW (ref >60)
Glucose, Bld: 189 mg/dL — ABNORMAL HIGH (ref 70–99)
Potassium: 4.5 mmol/L (ref 3.5–5.1)
Sodium: 139 mmol/L (ref 135–145)

## 2019-09-05 MED ORDER — ONDANSETRON HCL 4 MG/2ML IJ SOLN
4.0000 mg | INTRAMUSCULAR | Status: DC | PRN
  Administered 2019-09-05: 4 mg via INTRAVENOUS
  Filled 2019-09-05: qty 2

## 2019-09-05 MED ORDER — CHLORHEXIDINE GLUCONATE CLOTH 2 % EX PADS: 6.0000 | MEDICATED_PAD | Freq: Every day | CUTANEOUS | Status: DC

## 2019-09-05 MED ORDER — ACETAMINOPHEN 325 MG PO TABS
650.0000 mg | ORAL_TABLET | ORAL | Status: DC | PRN
  Administered 2019-09-05: 650 mg via ORAL
  Filled 2019-09-05: qty 2

## 2019-09-05 NOTE — Progress Notes (Signed)
Patient ambulating back and forth from bed to bathroom 2-3 times, tolerating activity well.

## 2019-09-05 NOTE — Progress Notes (Signed)
Notified RR RN about pt's yellow MEWs. Pt is A&O x4. Advised to continue to monitor the pt and alert RR if her respirations decrease or if she becomes less responsive.

## 2019-09-05 NOTE — Progress Notes (Signed)
   09/04/19 2349  Assess: MEWS Score  Temp 97.7 F (36.5 C)  BP 100/61  Pulse Rate (!) 59  Resp (!) 9  Level of Consciousness Alert  SpO2 97 %  O2 Device Nasal Cannula  O2 Flow Rate (L/min) 2 L/min  Assess: MEWS Score  MEWS Temp 0  MEWS Systolic 1  MEWS Pulse 0  MEWS RR 1  MEWS LOC 0  MEWS Score 2  MEWS Score Color Yellow  Assess: if the MEWS score is Yellow or Red  Were vital signs taken at a resting state? Yes  Focused Assessment Documented focused assessment  Early Detection of Sepsis Score *See Row Information* Low  MEWS guidelines implemented *See Row Information* Yes  Treat  MEWS Interventions Escalated (See documentation below)  Take Vital Signs  Increase Vital Sign Frequency  Yellow: Q 2hr X 2 then Q 4hr X 2, if remains yellow, continue Q 4hrs  Escalate  MEWS: Escalate Yellow: discuss with charge nurse/RN and consider discussing with provider and RRT  Notify: Charge Nurse/RN  Name of Charge Nurse/RN Notified Wins[place], CN  Notify: Rapid Response  Name of Rapid Response RN Notified Gilmer Mor

## 2019-09-05 NOTE — Plan of Care (Signed)

## 2019-09-06 MED ORDER — OXYCODONE-ACETAMINOPHEN 5-325 MG PO TABS: 1.0000 | ORAL_TABLET | Freq: Four times a day (QID) | ORAL | 0 refills | Status: DC | PRN

## 2019-09-06 MED ORDER — KETOROLAC TROMETHAMINE 10 MG PO TABS: 10.0000 mg | ORAL_TABLET | Freq: Three times a day (TID) | ORAL | 0 refills | Status: DC | PRN

## 2019-09-06 MED ORDER — CEFPODOXIME PROXETIL 100 MG PO TABS: 100.0000 mg | ORAL_TABLET | Freq: Two times a day (BID) | ORAL | 0 refills | Status: DC

## 2019-09-06 NOTE — Discharge Summary (Signed)
Physician Discharge Summary  Patient ID: [name] MRN: [id number] DOB/AGE: 16-Sep-1950 69 y.o.  Admit date: 09/04/2019 Discharge date: 09/06/2019  Admission Diagnoses: Bilateral Ureteral STones, Bacteruria.   Discharge Diagnoses:  Active Problems:   Ureteral stone with hydronephrosis   Discharged Condition: fair  Hospital Course: Pt underwent urgent bilateral stenting on 09/04/19, the day of admission, for bilateral ureteral stones in setting of early pyelo. 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 By 4/22, the day of discharge, she is afebrile x 24 hours, voiding on her own, tollerating ABX and felt to be adequate for discharge. Cr <1.5.   Consults: None  Significant Diagnostic Studies: labs: as per above  Treatments: surgery: as per above  Discharge Exam: Blood pressure 138/77, pulse 63, temperature 97.6 F (36.4 C), temperature source Oral, resp. rate 20, height 5\' 2"  (1.575 m), weight 70.7 kg, SpO2 99 %. General appearance: alert and cooperative Eyes: negative Nose: Nares normal. Septum midline. Mucosa normal. No drainage or sinus tenderness. Throat: lips, mucosa, and tongue normal; teeth and gums normal Neck: supple, symmetrical, trachea midline Back: symmetric, no curvature. ROM normal. No CVA tenderness. Resp: non-labored on room air.  Cardio: Nl rate GI: soft, non-tender; bowel sounds normal; no masses,  no organomegaly Extremities: extremities normal, atraumatic, no cyanosis or edema Pulses: 2+ and symmetric Lymph nodes: Cervical, supraclavicular, and axillary nodes normal.  Disposition:     Follow-up Information    Alexis Frock, MD Follow up.   Specialty: Urology Why: Office will call to schedule outpatient ureteroscopy for stone removal.  Contact information: [address] [place] [id number] [phone]           Signed: Alexis Frock 09/06/2019, 7:32 AM

## 2019-09-06 NOTE — Discharge Instructions (Signed)
1 - You may have urinary urgency (bladder spasms) and bloody urine on / off with stent in place. This is normal. ° °2 - Call MD or go to ER for fever >102, severe pain / nausea / vomiting not relieved by medications, or acute change in medical status ° °

## 2019-09-07 ENCOUNTER — Other Ambulatory Visit: Payer: Self-pay | Admitting: Urology

## 2019-09-07 LAB — URINE CULTURE: Culture: >=100000 — AB

## 2019-09-11 ENCOUNTER — Telehealth: Payer: Self-pay | Admitting: *Deleted

## 2019-09-11 ENCOUNTER — Other Ambulatory Visit: Payer: Self-pay

## 2019-09-11 DIAGNOSIS — R1907 Generalized intra-abdominal and pelvic swelling, mass and lump: Secondary | ICD-10-CM

## 2019-09-11 NOTE — Telephone Encounter (Signed)
[name], a patient of Dr. Fermin Schwab,  called to set up an appointment to be seen regarding a new lesion on her CT. She let me know that she has a MRI scheduled for 5/5. I called her back to with an appointment w/ Dr. Berline Lopes.Time and date of appt, confirmed w/ patient

## 2019-09-14 DIAGNOSIS — I129 Hypertensive chronic kidney disease with stage 1 through stage 4 chronic kidney disease, or unspecified chronic kidney disease: Secondary | ICD-10-CM | POA: Diagnosis not present

## 2019-09-14 DIAGNOSIS — N183 Chronic kidney disease, stage 3 unspecified: Secondary | ICD-10-CM | POA: Diagnosis not present
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
## 2019-09-16 ENCOUNTER — Other Ambulatory Visit: Payer: Self-pay | Admitting: Internal Medicine

## 2019-09-17 ENCOUNTER — Ambulatory Visit (INDEPENDENT_AMBULATORY_CARE_PROVIDER_SITE_OTHER): Payer: Medicare Other | Admitting: Psychology

## 2019-09-17 DIAGNOSIS — F4321 Adjustment disorder with depressed mood: Secondary | ICD-10-CM | POA: Diagnosis not present

## 2019-09-17 NOTE — Telephone Encounter (Signed)
First tranmission failed need to resend../l;mb

## 2019-09-17 NOTE — Addendum Note (Signed)
Addended by: Earnstine Regal on: 09/17/2019 09:42 AM   Modules accepted: Orders

## 2019-09-18 ENCOUNTER — Encounter (HOSPITAL_BASED_OUTPATIENT_CLINIC_OR_DEPARTMENT_OTHER): Payer: Self-pay | Admitting: Urology

## 2019-09-18 ENCOUNTER — Other Ambulatory Visit: Payer: Self-pay

## 2019-09-18 ENCOUNTER — Other Ambulatory Visit (HOSPITAL_COMMUNITY)
Admission: RE | Admit: 2019-09-18 | Discharge: 2019-09-18 | Disposition: A | Discharge status: Home / Self Care | Payer: Medicare Other | Source: Ambulatory Visit | Attending: Urology | Admitting: Urology

## 2019-09-18 DIAGNOSIS — Z01812 Encounter for preprocedural laboratory examination: Secondary | ICD-10-CM | POA: Diagnosis not present

## 2019-09-18 DIAGNOSIS — Z20822 Contact with and (suspected) exposure to covid-19: Secondary | ICD-10-CM | POA: Diagnosis not present

## 2019-09-18 LAB — SARS CORONAVIRUS 2 (TAT 6-24 HRS): SARS Coronavirus 2: NEGATIVE

## 2019-09-18 MED ORDER — ALPRAZOLAM 0.5 MG PO TABS: ORAL_TABLET | ORAL | 3 refills | Status: DC

## 2019-09-18 NOTE — Progress Notes (Addendum)
ADDENDUM: Chart reviewed by anesthesia, Konrad Felix PA, ok to proceed.   Spoke w/ via phone for pre-op interview--- PT Lab needs dos----  Istat 8             Lab results------ current ekg in chart / epic COVID test ------ 09-18-2019 @ 1330 Arrive at ------- 1030 NPO after ------ MN Medications to take morning of surgery ----- Buspar, Nexium, Prozac, Estradiol, Synthroid w/ sips of water and do albuterol nebulizer  (per pt does not have currently a rescue inhaler) Diabetic medication ----- n/a Patient Special Instructions ----- n/a Pre-Op special Istructions ----- n/a Patient verbalized understanding of instructions that were given at this phone interview. Patient denies shortness of breath, chest pain, fever, cough a this phone interview.   Anesthesia Review:  HTN, CKD III, mild Asthma, post concussion syndrome .  Pt denies any cardiac s&s, sob, difficulty breathing and no peripheral swelling. Chart to be reviewed by Konrad Felix PA.  PCP: Dr Alain Marion (lov 08-27-2019 epic)  Cardiologist : Dr Bettina Gavia (lov 07-25-2019 epic)  Nephrologist:  Dr Carolin Sicks  (pt stated lov 09-14-2019)  Pulmonology: Dr Melvyn Novas (05-29-2018 epic)  Allergy/ Asthma:  Dr Neldon Mc [facility] 06-29-2019 epic)  Oncology:  [facility], office visit 09-20-2019  Chest x-ray :  06-16-2018 epic EKG : 10-03-2018 epic Stress test:  Stress echo 08-22-2017 epic Echo :  10-15-2016 epic Cardiac Cath :  08-26-2017 epic  Sleep Study/ CPAP : NO Fasting Blood Sugar :      / Checks Blood Sugar -- times a day:   N/A Blood Thinner/ Instructions Maryjane Hurter Dose:  NO  ASA / Instructions/ Last Dose :  ASA 81mg /  Per pt was given instructions from dr Tresa Moore office to stop 5 days prior/  Pt stated last dose 09-16-2019

## 2019-09-19 ENCOUNTER — Encounter: Payer: Self-pay | Admitting: Gynecologic Oncology

## 2019-09-19 DIAGNOSIS — R936 Abnormal findings on diagnostic imaging of limbs: Secondary | ICD-10-CM | POA: Diagnosis not present

## 2019-09-19 DIAGNOSIS — K862 Cyst of pancreas: Secondary | ICD-10-CM | POA: Diagnosis not present

## 2019-09-19 DIAGNOSIS — C499 Malignant neoplasm of connective and soft tissue, unspecified: Secondary | ICD-10-CM | POA: Diagnosis not present

## 2019-09-20 ENCOUNTER — Other Ambulatory Visit: Payer: Self-pay

## 2019-09-20 ENCOUNTER — Encounter: Payer: Self-pay | Admitting: Gynecologic Oncology

## 2019-09-20 ENCOUNTER — Other Ambulatory Visit: Payer: Self-pay | Admitting: Oncology

## 2019-09-20 ENCOUNTER — Ambulatory Visit
Admission: RE | Admit: 2019-09-20 | Discharge: 2019-09-20 | Disposition: A | Discharge status: Home / Self Care | Payer: Self-pay | Source: Ambulatory Visit | Attending: Gynecologic Oncology | Admitting: Gynecologic Oncology

## 2019-09-20 ENCOUNTER — Inpatient Hospital Stay: Payer: Medicare Other | Attending: Gynecologic Oncology | Admitting: Gynecologic Oncology

## 2019-09-20 VITALS — BP 118/76 | HR 74 | Temp 98.3°F | Resp 16 | Ht 62.0 in | Wt 150.0 lb

## 2019-09-20 DIAGNOSIS — I129 Hypertensive chronic kidney disease with stage 1 through stage 4 chronic kidney disease, or unspecified chronic kidney disease: Secondary | ICD-10-CM | POA: Diagnosis not present

## 2019-09-20 DIAGNOSIS — Z9221 Personal history of antineoplastic chemotherapy: Secondary | ICD-10-CM | POA: Insufficient documentation

## 2019-09-20 DIAGNOSIS — Z90722 Acquired absence of ovaries, bilateral: Secondary | ICD-10-CM | POA: Diagnosis not present

## 2019-09-20 DIAGNOSIS — C55 Malignant neoplasm of uterus, part unspecified: Secondary | ICD-10-CM

## 2019-09-20 DIAGNOSIS — R1900 Intra-abdominal and pelvic swelling, mass and lump, unspecified site: Secondary | ICD-10-CM

## 2019-09-20 DIAGNOSIS — M545 Low back pain: Secondary | ICD-10-CM | POA: Diagnosis not present

## 2019-09-20 DIAGNOSIS — C499 Malignant neoplasm of connective and soft tissue, unspecified: Secondary | ICD-10-CM

## 2019-09-20 DIAGNOSIS — R600 Localized edema: Secondary | ICD-10-CM | POA: Diagnosis not present

## 2019-09-20 DIAGNOSIS — N132 Hydronephrosis with renal and ureteral calculous obstruction: Secondary | ICD-10-CM | POA: Insufficient documentation

## 2019-09-20 DIAGNOSIS — R109 Unspecified abdominal pain: Secondary | ICD-10-CM | POA: Diagnosis not present

## 2019-09-20 DIAGNOSIS — R5383 Other fatigue: Secondary | ICD-10-CM | POA: Insufficient documentation

## 2019-09-20 DIAGNOSIS — R110 Nausea: Secondary | ICD-10-CM | POA: Insufficient documentation

## 2019-09-20 DIAGNOSIS — Z90710 Acquired absence of both cervix and uterus: Secondary | ICD-10-CM | POA: Diagnosis not present

## 2019-09-20 DIAGNOSIS — N183 Chronic kidney disease, stage 3 unspecified: Secondary | ICD-10-CM | POA: Diagnosis not present

## 2019-09-20 DIAGNOSIS — C52 Malignant neoplasm of vagina: Secondary | ICD-10-CM | POA: Insufficient documentation

## 2019-09-20 HISTORY — DX: Intra-abdominal and pelvic swelling, mass and lump, unspecified site: R19.00

## 2019-09-20 MED ORDER — TRAMADOL HCL 50 MG PO TABS: 50.0000 mg | ORAL_TABLET | Freq: Four times a day (QID) | ORAL | 0 refills | Status: DC | PRN

## 2019-09-20 NOTE — Patient Instructions (Addendum)
Preparing for your Surgery  Plan for surgery on Oct 09, 2019 with Dr. Jeral Pinch at [facility] will be scheduled for a robotic assisted laparoscopic resection of pelvic mass, possible laparotomy, possible bowel resection, possible colostomy.   Plan to have a PET scan before surgery and we will release or contact you with the results.  We will let you know after the discussion of your case at tumor board about whether a bowel prep is needed before surgery.  Pre-operative Testing -You will receive a phone call from presurgical testing at [facility] to arrange for a pre-operative appointment over the phone, lab appointment, and COVID test. The COVID test normally happens 3 days prior to the surgery and they ask that you self quarantine after the test up until surgery to decrease chance of exposure.  -Bring your insurance card, copy of an advanced directive if applicable, medication list  -At that visit, you will be asked to sign a consent for a possible blood transfusion in case a transfusion becomes necessary during surgery.  The need for a blood transfusion is rare but having consent is a necessary part of your care.     -You can continue taking aspirin 81 mg up until the day before surgery.  -Do not take supplements such as fish oil (omega 3), red yeast rice, turmeric before your surgery. Please stop your fish oil now.  Day Before Surgery at [facility] will be asked to take in a light diet the day before surgery.  Avoid carbonated beverages.  You will be advised you can have clear liquids after midnight and up until 3 hours before your surgery.    Eat a light diet the day before surgery.  Examples including soups, broths, toast, yogurt, mashed potatoes.  Things to avoid include carbonated beverages (fizzy beverages), raw fruits and raw vegetables, or beans.   If your bowels are filled with gas, your surgeon will have difficulty  visualizing your pelvic organs which increases your surgical risks.  IF A BOWEL PREP IS NEEDED, SEE THE INSTRUCTIONS AT THE END OF THIS HANDOUT.  Your role in recovery Your role is to become active as soon as directed by your doctor, while still giving yourself time to heal.  Rest when you feel tired. You will be asked to do the following in order to speed your recovery:  - Cough and breathe deeply. This helps to clear and expand your lungs and can prevent pneumonia after surgery.  - [place]. Do mild physical activity. Walking or moving your legs help your circulation and body functions return to normal. Do not try to get up or walk alone the first time after surgery.   -If you develop swelling on one leg or the other, pain in the back of your leg, redness/warmth in one of your legs, please call the office or go to the Emergency Room to have a doppler to rule out a blood clot. For shortness of breath, chest pain-seek care in the Emergency Room as soon as possible. - Actively manage your pain. Managing your pain lets you move in comfort. We will ask you to rate your pain on a scale of zero to 10. It is your responsibility to tell your doctor or nurse where and how much you hurt so your pain can be treated.  Special Considerations -If you are diabetic, you may be placed on insulin after  surgery to have closer control over your blood sugars to promote healing and recovery.  This does not mean that you will be discharged on insulin.  If applicable, your oral antidiabetics will be resumed when you are tolerating a solid diet.  -Your final pathology results from surgery should be available around one week after surgery and the results will be relayed to you when available.  -Dr. Lahoma Crocker is the surgeon that assists your GYN Oncologist with surgery.  If you end up staying the night, the next day after your surgery you will either see Dr. Denman George, Dr. Berline Lopes, or Dr. Lahoma Crocker.  -FMLA forms can be faxed to [phone] and please allow 5-7 business days for completion.  Pain Management After Surgery -You have been prescribed your pain medication and bowel regimen medications before surgery so that you can have these available when you are discharged from the hospital. The pain medication is for use ONLY AFTER surgery and a new prescription will not be given.   -Make sure that you have Tylenol at home to use on a regular basis after surgery for pain control. We recommend alternating the medications every hour to six hours since they work differently and are processed in the body differently for pain relief.  -Review the attached handout on narcotic use and their risks and side effects.   Bowel Regimen -You have been prescribed Sennakot-S to take nightly to prevent constipation especially if you are taking the narcotic pain medication intermittently.  It is important to prevent constipation and drink adequate amounts of liquids. You can stop taking this medication when you are not taking pain medication and you are back on your normal bowel routine.  Risks of Surgery Risks of surgery are low but include bleeding, infection, damage to surrounding structures, re-operation, blood clots, and very rarely death.   Blood Transfusion Information (For the consent to be signed before surgery)  We will be checking your blood type before surgery so in case of emergencies, we will know what type of blood you would need.                                            WHAT IS A BLOOD TRANSFUSION?  A transfusion is the replacement of blood or some of its parts. Blood is made up of multiple cells which provide different functions.  Red blood cells carry oxygen and are used for blood loss replacement.  White blood cells fight against infection.  Platelets control bleeding.  Plasma helps clot blood.  Other blood products are available for specialized needs, such as  hemophilia or other clotting disorders. BEFORE THE TRANSFUSION  Who gives blood for transfusions?   You may be able to donate blood to be used at a later date on yourself (autologous donation).  Relatives can be asked to donate blood. This is generally not any safer than if you have received blood from a stranger. The same precautions are taken to ensure safety when a relative's blood is donated.  Healthy volunteers who are fully evaluated to make sure their blood is safe. This is blood bank blood. Transfusion therapy is the safest it has ever been in the practice of medicine. Before blood is taken from a donor, a complete history is taken to make sure that person has no history of diseases nor engages in risky social behavior (examples  are intravenous drug use or sexual activity with multiple partners). The donor's travel history is screened to minimize risk of transmitting infections, such as malaria. The donated blood is tested for signs of infectious diseases, such as HIV and hepatitis. The blood is then tested to be sure it is compatible with you in order to minimize the chance of a transfusion reaction. If you or a relative donates blood, this is often done in anticipation of surgery and is not appropriate for emergency situations. It takes many days to process the donated blood. RISKS AND COMPLICATIONS Although transfusion therapy is very safe and saves many lives, the main dangers of transfusion include:   Getting an infectious disease.  Developing a transfusion reaction. This is an allergic reaction to something in the blood you were given. Every precaution is taken to prevent this. The decision to have a blood transfusion has been considered carefully by your caregiver before blood is given. Blood is not given unless the benefits outweigh the risks.  AFTER SURGERY INSTRUCTIONS  Return to work: 4-6 weeks if applicable  Activity: 1. Be up and out of the bed during the day.  Take a nap  if needed.  You may walk up steps but be careful and use the hand rail.  Stair climbing will tire you more than you think, you may need to stop part way and rest.   2. No lifting or straining for 6 weeks over 10 pounds. No pushing, pulling, straining for 6 weeks.  3. No driving for around 1 week(s).  Do not drive if you are taking narcotic pain medicine and make sure that your reaction time has returned.   4. You can shower as soon as the next day after surgery. Shower daily.  Use soap and water on your incision and pat dry; don't rub.  No tub baths or submerging your body in water until cleared by your surgeon. If you have the soap that was given to you by pre-surgical testing that was used before surgery, you do not need to use it afterwards because this can irritate your incisions.   5. No sexual activity and nothing in the vagina for 2 weeks.  6. You may experience a small amount of clear drainage from your incisions, which is normal.  If the drainage persists, increases, or changes color please call the office.  7. Do not use creams, lotions, or ointments such as neosporin on your incisions after surgery until advised by your surgeon because they can cause removal of the dermabond glue on your incisions.    8. Take Tylenol first for pain and only use narcotic pain medication for severe pain not relieved by the Tylenol.  Monitor your Tylenol intake to a max of 4,000 mg in a 24 hour period.  Diet: 1. Low sodium Heart Healthy Diet is recommended.  2. It is safe to use a laxative, such as Miralax or Colace, if you have difficulty moving your bowels. You can take Sennakot at bedtime every evening to keep bowel movements regular and to prevent constipation.    Wound Care: 1. Keep clean and dry.  Shower daily.  Reasons to call the Doctor:  Fever - Oral temperature greater than 100.4 degrees Fahrenheit  Foul-smelling vaginal discharge  Difficulty urinating  Nausea and  vomiting  Increased pain at the site of the incision that is unrelieved with pain medicine.  Difficulty breathing with or without chest pain  New calf pain especially if only on one side  Sudden, continuing increased vaginal bleeding with or without clots.   Contacts: For questions or concerns you should contact:  Dr. Jeral Pinch at [phone]  Joylene John, NP at [phone]  After Hours: call [phone] and have the GYN Oncologist paged/contacted  [place] supplies for the bowel prep at a pharmacy of your choice: Office-e-mailed prescriptions for your antibiotic pills (Neomycin & Erythromycin)  2 bottles of magnesium citrate- no prescription required    Change your diet to make the bowel prep go more easily: Switch to a bland, low fiber diet Stop eating any nuts, popcorn, or fruit with seeds.  Stop all fiber supplements such as Metamucil, Miralax, etc.    Improve nutrition: Consider drinking 2-3 nutritional shakes (Ex: Ensure Surgery) every day, starting 5 days prior to surgery   DAY PRIOR TO SURGERY   Switch to a full liquid diet the day before surgery Drink plenty of liquids all day to avoid getting dehydrated    12:00pm Drink two bottles of magnesium citrate.   (You should finish in 2 hours)  2:00pm Take 2 Neomycin 500mg  tablets & 2 Erythromycin 500mg  tablets  3:00pm Take 2 Neomycin 500mg  tablets & 2 Erythromycin 500mg  tablets  Drink plenty of clear liquids all evening to avoid getting dehydrated  10:00pm Take 2 Neomycin 500mg  tablets & 2 Erythromycin 500mg  tablets  Drink 2 Carbohydrate loading nutrition drinks (ex: Ensure Presurgery). These will be given at pre-op appointment. Do not eat anything solid after bedtime (midnight) the night before your surgery.   BUT DO drink plenty of clear liquids (Water, Gatorade, juice, soda, coffee, tea, broths, etc.) up to 3 hours prior to surgery to avoid  getting dehydrated.    MORNING OF SURGERY  Remember to not to eat anything solid that morning  Drink one final carbohydrate loading nutritional drink (ex: Ensure Presurgery) upon waking up in the morning (needs to be 3 hours before your surgery). Hold or take medications as recommended by the hospital staff at your Preoperative visit Stop drinking liquids before you leave the house (>3 hours prior to surgery)    If you have questions or concerns, please call GYN Oncology Office at [phone] during business hours to speak to the clinical staff for advice.    WHAT DO I NEED TO KNOW ABOUT A FULL LIQUID DIET? -You may have any liquid. -You may have any food that becomes a liquid at room temperature. The food is considered a liquid if it can be poured off a spoon at room temperature. WHAT FOODS CAN I EAT? -Grains: Any grain food that can be pureed in soup (such as crackers, pasta, and rice). Hot cereal (such as farina or oatmeal) that has been blended.  -Fruits: Fruit juice, including nectars and juices. -Meats and Other Protein Sources: Eggs in custard, eggnog mix, and eggs used in ice cream or pudding. Strained meats, like in baby food, may be allowed. Consult your health care provider.  -Dairy: Milk and milk-based beverages, including milk shakes and instant breakfast mixes. Smooth yogurt. Pureed cottage cheese. Avoid these foods if they are not well tolerated. -Beverages: All beverages, including liquid nutritional supplements. AVOID CARBONATED BEVERAGES. -Condiments: Iodized salt, pepper, spices, and flavorings. Cocoa powder. Vinegar, ketchup, yellow mustard, smooth sauces (such as hollandaise, cheese sauce, or white sauce), and soy sauce. -Sweets and Desserts: Custard, smooth pudding. Flavored gelatin. Tapioca, junket. Plain ice cream, sherbet, fruit ices. Frozen ice pops, frozen fudge pops,  pudding pops, and other frozen bars with cream. Syrups, including chocolate syrup. Sugar,  honey, jelly.  -Fats and Oils: Margarine, butter, cream, sour cream, and oils. -Other: Broth and cream soups. Strained, broth-based soups. The items listed above may not be a complete list of recommended foods or beverages.  WHAT FOODS CAN I NOT EAT? Grains: All breads. Grains are not allowed unless they are pureed into soup. Vegetables: No raw vegetables. Vegetables are not allowed unless they are juiced, or cooked and pureed into soup. Fruits: Fruits are not allowed unless they are juiced. Meats and Other Protein Sources: Any meat or fish. Cooked or raw eggs. Nut butters.  Dairy: Cheese.  Condiments: Stone ground mustards. Fats and Oils: Fats that are coarse or chunky. Sweets and Desserts: Ice cream or other frozen desserts that have any solids in them or on top, such as nuts, chocolate chips, and pieces of cookies. Cakes. Cookies. Candy. Others: Soups with chunks or pieces in them.

## 2019-09-21 ENCOUNTER — Ambulatory Visit (HOSPITAL_BASED_OUTPATIENT_CLINIC_OR_DEPARTMENT_OTHER): Payer: Medicare Other | Admitting: Physician Assistant

## 2019-09-21 ENCOUNTER — Encounter (HOSPITAL_BASED_OUTPATIENT_CLINIC_OR_DEPARTMENT_OTHER): Payer: Self-pay | Admitting: Urology

## 2019-09-21 ENCOUNTER — Inpatient Hospital Stay
Admission: RE | Admit: 2019-09-21 | Discharge: 2019-09-21 | Disposition: A | Discharge status: Home / Self Care | Payer: Self-pay | Source: Ambulatory Visit | Attending: Gynecologic Oncology | Admitting: Gynecologic Oncology

## 2019-09-21 ENCOUNTER — Encounter (HOSPITAL_BASED_OUTPATIENT_CLINIC_OR_DEPARTMENT_OTHER)
Admission: RE | Disposition: A | Discharge status: Home / Self Care | Payer: Self-pay | Source: Home / Self Care | Attending: Urology

## 2019-09-21 ENCOUNTER — Other Ambulatory Visit (HOSPITAL_COMMUNITY): Payer: Self-pay | Admitting: Gynecologic Oncology

## 2019-09-21 ENCOUNTER — Ambulatory Visit (HOSPITAL_BASED_OUTPATIENT_CLINIC_OR_DEPARTMENT_OTHER)
Admission: RE | Admit: 2019-09-21 | Discharge: 2019-09-21 | Disposition: A | Discharge status: Home / Self Care | Payer: Medicare Other | Attending: Urology | Admitting: Urology

## 2019-09-21 DIAGNOSIS — F0390 Unspecified dementia without behavioral disturbance: Secondary | ICD-10-CM | POA: Insufficient documentation

## 2019-09-21 DIAGNOSIS — N202 Calculus of kidney with calculus of ureter: Secondary | ICD-10-CM | POA: Diagnosis not present

## 2019-09-21 DIAGNOSIS — Z86718 Personal history of other venous thrombosis and embolism: Secondary | ICD-10-CM | POA: Insufficient documentation

## 2019-09-21 DIAGNOSIS — R1900 Intra-abdominal and pelvic swelling, mass and lump, unspecified site: Secondary | ICD-10-CM

## 2019-09-21 DIAGNOSIS — Z9221 Personal history of antineoplastic chemotherapy: Secondary | ICD-10-CM | POA: Insufficient documentation

## 2019-09-21 DIAGNOSIS — N183 Chronic kidney disease, stage 3 unspecified: Secondary | ICD-10-CM | POA: Insufficient documentation

## 2019-09-21 DIAGNOSIS — I129 Hypertensive chronic kidney disease with stage 1 through stage 4 chronic kidney disease, or unspecified chronic kidney disease: Secondary | ICD-10-CM | POA: Insufficient documentation

## 2019-09-21 DIAGNOSIS — N132 Hydronephrosis with renal and ureteral calculous obstruction: Secondary | ICD-10-CM | POA: Diagnosis not present

## 2019-09-21 DIAGNOSIS — Z8589 Personal history of malignant neoplasm of other organs and systems: Secondary | ICD-10-CM | POA: Diagnosis not present

## 2019-09-21 DIAGNOSIS — K219 Gastro-esophageal reflux disease without esophagitis: Secondary | ICD-10-CM | POA: Diagnosis not present

## 2019-09-21 DIAGNOSIS — Z87440 Personal history of urinary (tract) infections: Secondary | ICD-10-CM | POA: Insufficient documentation

## 2019-09-21 DIAGNOSIS — C801 Malignant (primary) neoplasm, unspecified: Secondary | ICD-10-CM

## 2019-09-21 DIAGNOSIS — D631 Anemia in chronic kidney disease: Secondary | ICD-10-CM | POA: Diagnosis not present

## 2019-09-21 HISTORY — DX: Calculus of kidney: N20.0

## 2019-09-21 HISTORY — DX: Other chronic cystitis without hematuria: N30.20

## 2019-09-21 HISTORY — DX: Generalized anxiety disorder: F41.1

## 2019-09-21 HISTORY — DX: Mild persistent asthma, uncomplicated: J45.30

## 2019-09-21 HISTORY — DX: Hypothyroidism, unspecified: E03.9

## 2019-09-21 HISTORY — DX: Personal history of other venous thrombosis and embolism: Z86.718

## 2019-09-21 HISTORY — DX: Chronic kidney disease, stage 3 unspecified: N18.30

## 2019-09-21 HISTORY — DX: Iron deficiency anemia, unspecified: D50.9

## 2019-09-21 HISTORY — DX: Personal history of traumatic brain injury: Z87.820

## 2019-09-21 HISTORY — DX: Personal history of other medical treatment: Z92.89

## 2019-09-21 HISTORY — DX: Personal history of other diseases of the digestive system: Z87.19

## 2019-09-21 HISTORY — DX: Personal history of other specified conditions: Z87.898

## 2019-09-21 HISTORY — PX: CYSTOSCOPY WITH RETROGRADE PYELOGRAM, URETEROSCOPY AND STENT PLACEMENT: SHX5789

## 2019-09-21 HISTORY — DX: Presence of spectacles and contact lenses: Z97.3

## 2019-09-21 HISTORY — DX: Nonspecific reaction to tuberculin skin test without active tuberculosis: R76.11

## 2019-09-21 HISTORY — DX: Dry eye syndrome of unspecified lacrimal gland: H04.129

## 2019-09-21 HISTORY — DX: Urgency of urination: R39.15

## 2019-09-21 HISTORY — DX: Major depressive disorder, single episode, unspecified: F32.9

## 2019-09-21 LAB — POCT I-STAT, CHEM 8
BUN: 26 mg/dL — ABNORMAL HIGH (ref 8–23)
Calcium, Ion: 1.46 mmol/L — ABNORMAL HIGH (ref 1.15–1.40)
Chloride: 104 mmol/L (ref 98–111)
Creatinine, Ser: 2 mg/dL — ABNORMAL HIGH (ref 0.44–1.00)
Glucose, Bld: 106 mg/dL — ABNORMAL HIGH (ref 70–99)
HCT: 38 % (ref 36.0–46.0)
Hemoglobin: 12.9 g/dL (ref 12.0–15.0)
Potassium: 4.4 mmol/L (ref 3.5–5.1)
Sodium: 136 mmol/L (ref 135–145)
TCO2: 24 mmol/L (ref 22–32)

## 2019-09-21 SURGERY — CYSTOURETEROSCOPY, WITH RETROGRADE PYELOGRAM AND STENT INSERTION
Anesthesia: General | Site: Urethra | Laterality: Bilateral

## 2019-09-21 MED ORDER — TRAMADOL HCL 50 MG PO TABS: 50.0000 mg | ORAL_TABLET | Freq: Four times a day (QID) | ORAL | 0 refills | Status: DC | PRN

## 2019-09-21 MED ORDER — FENTANYL CITRATE (PF) 100 MCG/2ML IJ SOLN: 25.0000 ug | INTRAMUSCULAR | Status: DC | PRN

## 2019-09-21 MED ORDER — PROPOFOL 10 MG/ML IV BOLUS
INTRAVENOUS | Status: DC | PRN
  Administered 2019-09-21: 120 mg via INTRAVENOUS
  Administered 2019-09-21: 50 mg via INTRAVENOUS
  Administered 2019-09-21: 30 mg via INTRAVENOUS

## 2019-09-21 MED ORDER — LIDOCAINE 2% (20 MG/ML) 5 ML SYRINGE
INTRAMUSCULAR | Status: AC
  Filled 2019-09-21: qty 5

## 2019-09-21 MED ORDER — MIDAZOLAM HCL 5 MG/5ML IJ SOLN
INTRAMUSCULAR | Status: DC | PRN
  Administered 2019-09-21: 2 mg via INTRAVENOUS

## 2019-09-21 MED ORDER — MIDAZOLAM HCL 2 MG/2ML IJ SOLN
INTRAMUSCULAR | Status: AC
  Filled 2019-09-21: qty 2

## 2019-09-21 MED ORDER — EPHEDRINE SULFATE 50 MG/ML IJ SOLN
INTRAMUSCULAR | Status: DC | PRN
  Administered 2019-09-21: 20 mg via INTRAVENOUS

## 2019-09-21 MED ORDER — TRAMADOL HCL 50 MG PO TABS
ORAL_TABLET | ORAL | Status: AC
  Filled 2019-09-21: qty 1

## 2019-09-21 MED ORDER — SODIUM CHLORIDE 0.9 % IV SOLN: INTRAVENOUS | Status: DC

## 2019-09-21 MED ORDER — PROPOFOL 10 MG/ML IV BOLUS
INTRAVENOUS | Status: AC
  Filled 2019-09-21: qty 20

## 2019-09-21 MED ORDER — ONDANSETRON HCL 4 MG/2ML IJ SOLN
INTRAMUSCULAR | Status: AC
  Filled 2019-09-21: qty 2

## 2019-09-21 MED ORDER — FENTANYL CITRATE (PF) 100 MCG/2ML IJ SOLN
INTRAMUSCULAR | Status: DC | PRN
  Administered 2019-09-21 (×8): 25 ug via INTRAVENOUS

## 2019-09-21 MED ORDER — ONDANSETRON HCL 4 MG/2ML IJ SOLN
INTRAMUSCULAR | Status: DC | PRN
Start: 2019-09-21 — End: 2019-09-21
  Administered 2019-09-21: 4 mg via INTRAVENOUS

## 2019-09-21 MED ORDER — DEXAMETHASONE SODIUM PHOSPHATE 4 MG/ML IJ SOLN
INTRAMUSCULAR | Status: DC | PRN
  Administered 2019-09-21: 10 mg via INTRAVENOUS

## 2019-09-21 MED ORDER — LIDOCAINE HCL (CARDIAC) PF 100 MG/5ML IV SOSY
PREFILLED_SYRINGE | INTRAVENOUS | Status: DC | PRN
  Administered 2019-09-21: 60 mg via INTRAVENOUS

## 2019-09-21 MED ORDER — IOHEXOL 300 MG/ML  SOLN
INTRAMUSCULAR | Status: DC | PRN
  Administered 2019-09-21: 33 mL via URETHRAL

## 2019-09-21 MED ORDER — KETOROLAC TROMETHAMINE 10 MG PO TABS: 10.0000 mg | ORAL_TABLET | Freq: Three times a day (TID) | ORAL | 0 refills | Status: DC | PRN

## 2019-09-21 MED ORDER — GENTAMICIN SULFATE 40 MG/ML IJ SOLN
1.5000 mg/kg | INTRAVENOUS | Status: DC
  Filled 2019-09-21: qty 2.5

## 2019-09-21 MED ORDER — GENTAMICIN IN SALINE 1-0.9 MG/ML-% IV SOLN
100.0000 mg | INTRAVENOUS | Status: AC
  Administered 2019-09-21: 100 mg via INTRAVENOUS
  Filled 2019-09-21: qty 100

## 2019-09-21 MED ORDER — CIPROFLOXACIN HCL 500 MG PO TABS
500.0000 mg | ORAL_TABLET | Freq: Two times a day (BID) | ORAL | 0 refills | Status: AC
Start: 2019-09-21 — End: 2019-09-23

## 2019-09-21 MED ORDER — TRAMADOL HCL 50 MG PO TABS
50.0000 mg | ORAL_TABLET | Freq: Four times a day (QID) | ORAL | Status: DC
  Administered 2019-09-21: 50 mg via ORAL

## 2019-09-21 MED ORDER — FENTANYL CITRATE (PF) 100 MCG/2ML IJ SOLN
INTRAMUSCULAR | Status: AC
  Filled 2019-09-21: qty 2

## 2019-09-21 MED ORDER — DEXAMETHASONE SODIUM PHOSPHATE 10 MG/ML IJ SOLN
INTRAMUSCULAR | Status: AC
  Filled 2019-09-21: qty 1

## 2019-09-21 SURGICAL SUPPLY — 25 items
BAG DRAIN URO-CYSTO SKYTR STRL (DRAIN) ×2 IMPLANT
BAG DRN UROCATH (DRAIN) ×1
BASKET LASER NITINOL 1.9FR (BASKET) ×1 IMPLANT
BSKT STON RTRVL 120 1.9FR (BASKET) ×1
CATH INTERMIT  6FR 70CM (CATHETERS) ×1 IMPLANT
CLOTH BEACON ORANGE TIMEOUT ST (SAFETY) ×2 IMPLANT
FIBER LASER TRAC TIP (UROLOGICAL SUPPLIES) ×1 IMPLANT
GLOVE BIO SURGEON STRL SZ7 (GLOVE) ×1 IMPLANT
GLOVE BIO SURGEON STRL SZ7.5 (GLOVE) ×2 IMPLANT
GLOVE BIOGEL PI IND STRL 7.0 (GLOVE) IMPLANT
GLOVE BIOGEL PI INDICATOR 7.0 (GLOVE) ×1
GOWN STRL REUS W/TWL LRG LVL3 (GOWN DISPOSABLE) ×3 IMPLANT
GUIDEWIRE ANG ZIPWIRE 038X150 (WIRE) ×3 IMPLANT
GUIDEWIRE STR DUAL SENSOR (WIRE) ×3 IMPLANT
IV NS IRRIG 3000ML ARTHROMATIC (IV SOLUTION) ×3 IMPLANT
KIT TURNOVER CYSTO (KITS) ×2 IMPLANT
MANIFOLD NEPTUNE II (INSTRUMENTS) ×2 IMPLANT
NS IRRIG 500ML POUR BTL (IV SOLUTION) ×2 IMPLANT
SHEATH URETERAL 12FRX28CM (UROLOGICAL SUPPLIES) ×1 IMPLANT
STENT POLARIS 5FRX24 (STENTS) ×2 IMPLANT
SYR 10ML LL (SYRINGE) ×2 IMPLANT
TRAY CYSTO PACK (CUSTOM PROCEDURE TRAY) ×2 IMPLANT
TUBE CONNECTING 12X1/4 (SUCTIONS) ×2 IMPLANT
TUBE FEEDING 8FR 16IN STR KANG (MISCELLANEOUS) ×1 IMPLANT
TUBING UROLOGY SET (TUBING) ×2 IMPLANT

## 2019-09-21 NOTE — Anesthesia Preprocedure Evaluation (Signed)
Anesthesia Evaluation  Patient identified by MRN, date of birth, ID band Patient awake    Reviewed: Allergy & Precautions, NPO status , Patient's Chart, lab work & pertinent test results  Airway Mallampati: II  TM Distance: >3 FB Neck ROM: Full    Dental  (+) Dental Advisory Given   Pulmonary asthma (mild. controlled) ,    Pulmonary exam normal breath sounds clear to auscultation       Cardiovascular hypertension, Pt. on medications Normal cardiovascular exam Rhythm:Regular Rate:Normal  ECG: SR, rate 65   Neuro/Psych  Headaches, PSYCHIATRIC DISORDERS Anxiety Depression Bipolar Disorder Dementia    GI/Hepatic Neg liver ROS, GERD  Medicated and Controlled,IBS (irritable bowel syndrome)   Endo/Other  Hypothyroidism   Renal/GU Renal disease     Musculoskeletal  (+) Arthritis , Fibromyalgia -  Abdominal   Peds  Hematology  (+) Blood dyscrasia, anemia , HLD   Anesthesia Other Findings BILATERAL URETERAL STONES, BACTURIA  Reproductive/Obstetrics                             Anesthesia Physical  Anesthesia Plan  ASA: III  Anesthesia Plan: General   Post-op Pain Management:    Induction: Intravenous  PONV Risk Score and Plan: 4 or greater and Ondansetron, Dexamethasone, Midazolam and Treatment may vary due to age or medical condition  Airway Management Planned: LMA  Additional Equipment: None  Intra-op Plan:   Post-operative Plan: Extubation in OR  Informed Consent: I have reviewed the patients History and Physical, chart, labs and discussed the procedure including the risks, benefits and alternatives for the proposed anesthesia with the patient or authorized representative who has indicated his/her understanding and acceptance.     Dental advisory given  Plan Discussed with: CRNA  Anesthesia Plan Comments:         Anesthesia Quick Evaluation

## 2019-09-21 NOTE — Anesthesia Postprocedure Evaluation (Signed)
Anesthesia Post Note  Patient: [name]  Procedure(s) Performed: CYSTOSCOPY WITH BILATERAL RETROGRADE PYELOGRAM, BILATERAL URETEROSCOPY HOLMIUM LASER AND STENT EXCHANGED (Bilateral Urethra)     Patient location during evaluation: PACU Anesthesia Type: General Level of consciousness: awake and alert Pain management: pain level controlled Vital Signs Assessment: post-procedure vital signs reviewed and stable Respiratory status: spontaneous breathing, nonlabored ventilation, respiratory function stable and patient connected to nasal cannula oxygen Cardiovascular status: blood pressure returned to baseline and stable Postop Assessment: no apparent nausea or vomiting Anesthetic complications: no    Last Vitals:  Vitals:   09/21/19 1500 09/21/19 1515  BP: 129/69 (!) 145/78  Pulse: 80 82  Resp: 14 11  Temp:    SpO2: 94% 93%    Last Pain:  Vitals:   09/21/19 1515  TempSrc:   PainSc: 5                  Aryah Doering DAVID

## 2019-09-21 NOTE — Anesthesia Procedure Notes (Signed)
Procedure Name: LMA Insertion Date/Time: 09/21/2019 1:05 PM Performed by: Justice Rocher, CRNA Pre-anesthesia Checklist: Patient identified, Emergency Drugs available, Suction available, Patient being monitored and Timeout performed Patient Re-evaluated:Patient Re-evaluated prior to induction Oxygen Delivery Method: Circle system utilized Preoxygenation: Pre-oxygenation with 100% oxygen Induction Type: IV induction Ventilation: Mask ventilation without difficulty LMA: LMA inserted LMA Size: 4.0 Number of attempts: 1 Airway Equipment and Method: Bite block Placement Confirmation: positive ETCO2,  breath sounds checked- equal and bilateral and CO2 detector Tube secured with: Tape Dental Injury: Teeth and Oropharynx as per pre-operative assessment

## 2019-09-21 NOTE — Transfer of Care (Signed)
Immediate Anesthesia Transfer of Care Note  Patient: [name]  Procedure(s) Performed: Procedure(s) (LRB): CYSTOSCOPY WITH BILATERAL RETROGRADE PYELOGRAM, BILATERAL URETEROSCOPY HOLMIUM LASER AND STENT EXCHANGED (Bilateral)  Patient Location: PACU  Anesthesia Type: General  Level of Consciousness: awake, sedated, patient cooperative and responds to stimulation  Airway & Oxygen Therapy: Patient Spontanous Breathing and Patient connected to [place] 02 and soft FM  Post-op Assessment: Report given to PACU RN, Post -op Vital signs reviewed and stable and Patient moving all extremities  Post vital signs: Reviewed and stable  Complications: No apparent anesthesia complications
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
## 2019-09-21 NOTE — Discharge Instructions (Signed)
1 - You may have urinary urgency (bladder spasms) and bloody urine on / off with stent in place. This is normal.  2 - Call MD or go to ER for fever >102, severe pain / nausea / vomiting not relieved by medications, or acute change in medical status   Post Anesthesia Home Care Instructions  Activity: Get plenty of rest for the remainder of the day. A responsible individual must stay with you for 24 hours following the procedure.  For the next 24 hours, DO NOT: -Drive a car -Paediatric nurse -Drink alcoholic beverages -Take any medication unless instructed by your physician -Make any legal decisions or sign important papers.  Meals: Start with liquid foods such as gelatin or soup. Progress to regular foods as tolerated. Avoid greasy, spicy, heavy foods. If nausea and/or vomiting occur, drink only clear liquids until the nausea and/or vomiting subsides. Call your physician if vomiting continues.  Special Instructions/Symptoms: Your throat may feel dry or sore from the anesthesia or the breathing tube placed in your throat during surgery. If this causes discomfort, gargle with warm salt water. The discomfort should disappear within 24 hours.   Ureteral Stent Implantation, Care After This sheet gives you information about how to care for yourself after your procedure. Your health care provider may also give you more specific instructions. If you have problems or questions, contact your health care provider. What can I expect after the procedure? After the procedure, it is common to have:  Nausea.  Mild pain when you urinate. You may feel this pain in your lower back or lower abdomen. The pain should stop within a few minutes after you urinate. This may last for up to 1 week.  A small amount of blood in your urine for several days. Follow these instructions at home: Medicines  Take over-the-counter and prescription medicines only as told by your health care provider.  If you were  prescribed an antibiotic medicine, take it as told by your health care provider. Do not stop taking the antibiotic even if you start to feel better.  Do not drive for 24 hours if you were given a sedative during your procedure.  Ask your health care provider if the medicine prescribed to you requires you to avoid driving or using heavy machinery. Activity  Rest as told by your health care provider.  Avoid sitting for a long time without moving. Get up to take short walks every 1-2 hours. This is important to improve blood flow and breathing. Ask for help if you feel weak or unsteady.  Return to your normal activities as told by your health care provider. Ask your health care provider what activities are safe for you. General instructions   Watch for any blood in your urine. Call your health care provider if the amount of blood in your urine increases.  If you have a catheter: ? Follow instructions from your health care provider about taking care of your catheter and collection bag. ? Do not take baths, swim, or use a hot tub until your health care provider approves. Ask your health care provider if you may take showers. You may only be allowed to take sponge baths.  Drink enough fluid to keep your urine pale yellow.  Do not use any products that contain nicotine or tobacco, such as cigarettes, e-cigarettes, and chewing tobacco. These can delay healing after surgery. If you need help quitting, ask your health care provider.  Keep all follow-up visits as told by your  health care provider. This is important. Contact a health care provider if:  You have pain that gets worse or does not get better with medicine, especially pain when you urinate.  You have difficulty urinating.  You feel nauseous or you vomit repeatedly during a period of more than 2 days after the procedure. Get help right away if:  Your urine is dark red or has blood clots in it.  You are leaking urine (have  incontinence).  The end of the stent comes out of your urethra.  You cannot urinate.  You have sudden, sharp, or severe pain in your abdomen or lower back.  You have a fever.  You have swelling or pain in your legs.  You have difficulty breathing. Summary  After the procedure, it is common to have mild pain when you urinate that goes away within a few minutes after you urinate. This may last for up to 1 week.  Watch for any blood in your urine. Call your health care provider if the amount of blood in your urine increases.  Take over-the-counter and prescription medicines only as told by your health care provider.  Drink enough fluid to keep your urine pale yellow. This information is not intended to replace advice given to you by your health care provider. Make sure you discuss any questions you have with your health care provider. Document Revised: 02/07/2018 Document Reviewed: 02/08/2018 Elsevier Patient Education  2020 [employer].

## 2019-09-21 NOTE — Brief Op Note (Signed)
09/21/2019  2:27 PM  PATIENT:  [name]  69 y.o. female  PRE-OPERATIVE DIAGNOSIS:  BILATERAL RENAL/ RENAL STONE  POST-OPERATIVE DIAGNOSIS:  BILATERAL RENAL/ RENAL STONE  PROCEDURE:  Procedure(s): CYSTOSCOPY WITH BILATERAL RETROGRADE PYELOGRAM, BILATERAL URETEROSCOPY HOLMIUM LASER AND STENT EXCHANGED (Bilateral) (LEFT 1st Stage)  SURGEON:  Surgeon(s) and Role:    * Alexis Frock, MD - Primary  PHYSICIAN ASSISTANT:   ASSISTANTS: none   ANESTHESIA:   general  EBL:  0 mL   BLOOD ADMINISTERED:none  DRAINS: none   LOCAL MEDICATIONS USED:  NONE  SPECIMEN:  Source of Specimen:  bilateral ureteral , Left renal stone fragments  DISPOSITION OF SPECIMEN:  [facility] for compositional analysis  COUNTS:  YES  TOURNIQUET:  * No tourniquets in log *  DICTATION: .Other Dictation: Dictation Number [id number]   PLAN OF CARE: Discharge to home after PACU  PATIENT DISPOSITION:  PACU - hemodynamically stable.   Delay start of Pharmacological VTE agent (>24hrs) due to surgical blood loss or risk of bleeding: yes

## 2019-09-22 NOTE — Op Note (Signed)
NAME: [name], [name] MEDICAL RECORD [id number] ACCOUNT [id number] DATE OF BIRTH:Apr 20, 1951 FACILITY: [facility] LOCATION: [facility] PHYSICIAN:Sota Hetz, MD  OPERATIVE REPORT  DATE OF PROCEDURE:  09/21/2019  PREOPERATIVE DIAGNOSIS:  Bilateral ureteral stones, large left renal stone, history of recurrent urinary tract infections.  PROCEDURE: 1.  Cystoscopy, bilateral pyelograms interpretation. 2.  Right ureteroscopy, laser lithotripsy and stent exchange. 3.  Left first-stage ureteroscopy with laser lithotripsy and stent exchange.  ESTIMATED BLOOD LOSS:  Nil.  MEDICATIONS:  None.  SPECIMENS:  Stone for analysis.  FINDINGS: 1.  Bilateral ureteral stones, right distal, left proximal. 2.  Very large volume left intrarenal stone greater than 1 cm very, very dense stones. 3.  Complete resolution of all right-sided stones larger than one-third mm following laser lithotripsy and extraction. 4.  Successful placement of right knee replacement, right ureteral stent proximal renal pelvis, distally in urinary bladder. 5.  Complete resolution of left ureteral stones. 6.  Ablation of approximately 70% of left renal stone volume. 7.  Successful placement of left ureteral stent proximal renal pelvis, distal end urinary bladder.  INDICATIONS:  The patient is a 69 year old woman with history of recurrent urolithiasis.  She has a history of recurrent urinary tract infections as well.  I have a high suspicion that her left renal stone was colonized.  She wound up with bilateral  ureteral stones last month, she was stented urgently as she had developed some infectious parameters.  She has now cleared for infectious parameters and presents today for ureteroscopic stimulation.  Her overall goal is stone free.  She does have large  volume left intrarenal stones.  We discussed beforehand that this may require a staged approach.  Informed consent was obtained and placed in medical  record.  DESCRIPTION OF PROCEDURE:  The patient was identified, verified using right paramedian bilateral ureteroscopic stimulation was confirmed.  A timeout was performed.  Intravenous administered.  General LMA anesthesia induced.  The patient was placed into a  low lithotomy position, sterile field was created prepped and draping this vagina, introitus and proximal thighs using iodine.  Cystourethroscopy was then performed using 21-French rigid cystoscope vessel lens.  Inspection of bladder revealed distal  bilateral stents in situ.  They were grasped proximal to the urethral meatus, with a separate 0.03 ZIPwire was advanced to lower pole and set aside as safety wires An 8-French feeding tube was placed in the urinary bladder for pressure release, and  semirigid ureteroscopy performed the distal left ureter alongside a separate sensor working wire  the level of the mid to proximal ureter.  A single stone was encountered.  It was quite impacted.  It was too large for simple basketing.  Holmium laser  energy was then applied to stones using settings of 0.2 joules and 20 Hz.  Approximately 50% of stone volume was fragmented was dusted.  The remaining fragmented and the fragments a retrograde positioned towards the level of the kidney.  Similarly  semirigid ureteroscopy was performed of the right ureter alongside a sensor working wire and the distal third of the ureter, there was a very of ovoid stone in the ureter.  It was much too large for simple basketing.  Holmium laser energy applied to  stone using settings again of 0.2 joules and 20 Hz.  Approximately 50% of stone volume was dusted.  The remaining amount fragmented the fragments were removed, set aside for analysis.  Repeat semirigid ureteroscopy of the entire length right ureter  revealed no additional stone fragments, the  semirigid scope was exchanged for a 12/14 short length ureteral access sheath level of the proximal ureter and flexible digital  ureteroscopy performed the proximal ureter.  Systematic inspection of the right  kidney showed there were multifocal small volume intrarenal papillary tip calcifications that were amenable to simple basketing.  They were removed, set aside for analysis.  The access sheath was then removed under continuous vision, no mucosal  abnormalities were found.  The right side was rendered stone free.    Next, the access sheath was placed up the left ureter to the level of the proximal ureter using continuous fluoroscopic guidance and flexible ureteroscopy with flexible digital ureteroscopy performed the proximal left ureter.  Systematic inspection of  the left kidney.  As anticipated, there was a very large volume of left intrarenal stone.  This was at least a centimeter.  This was much too large for simple basketing.  Vitals are multifocal papillary tip calcifications or several free floating  calcifications consistent with retrograde positioning of prior ureteral stone.  Holmium laser energy then applied to the dominant stone, using escalating settings and a dusting technique up to 1 joule and 10 Hz and after approximately an hour and 15  minutes of laser lithotripsy, we had ablated approximately 70% of the dominant stone.  Stone was very, very dense.  After this amount of time inherently there was generated which stone dust that visualization was quite poor.  It was clearly felt that a  staged approach would be warranted to achieve the goal of stone free.  As such, the access sheath was removed under continuous vision, no mucosal abnormalities were found, and a new 5 x 24 Polaris-type stent was placed remaining safety wire using  fluoroscopic guidance.  Good proximal coil was noted.  Similarly, a separate 5 x 24 Polaris stent was placed over the remaining safety wire using fluoroscopic guidance.  Good proximal coil were noted.  Procedure was then terminated.  The patient  tolerated the procedure well.  No  immediate complications.  The patient taken to the postanesthesia care in stable condition.  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  PN/NUANCE  D:09/21/2019 T:09/22/2019 JOB:[id number]

## 2019-09-24 ENCOUNTER — Other Ambulatory Visit: Payer: Self-pay | Admitting: Urology

## 2019-09-24 ENCOUNTER — Ambulatory Visit (INDEPENDENT_AMBULATORY_CARE_PROVIDER_SITE_OTHER): Payer: Medicare Other | Admitting: Psychology

## 2019-09-24 ENCOUNTER — Other Ambulatory Visit: Payer: Self-pay | Admitting: Oncology

## 2019-09-24 ENCOUNTER — Telehealth: Payer: Self-pay | Admitting: Gynecologic Oncology

## 2019-09-24 ENCOUNTER — Telehealth: Payer: Self-pay | Admitting: *Deleted

## 2019-09-24 DIAGNOSIS — N202 Calculus of kidney with calculus of ureter: Secondary | ICD-10-CM | POA: Diagnosis not present

## 2019-09-24 DIAGNOSIS — F4321 Adjustment disorder with depressed mood: Secondary | ICD-10-CM | POA: Diagnosis not present

## 2019-09-24 NOTE — Progress Notes (Signed)
Gynecologic Oncology Multi-Disciplinary Disposition Conference Note  Date of the Conference: 09/24/2019  Patient Name: [name]  Primary GYN Oncologist: Dr. Berline Lopes  Stage/Disposition:  Recurrent leiomyosarcoma. Disposition is for joint surgery with Dr. Marcello Moores with general surgery followed by radiation. Also to leave ureteral stents in place until after surgery.  This Multidisciplinary conference took place involving physicians from [place], [place], Radiation Oncology, Pathology, Radiology along with the Gynecologic Oncology Nurse Practitioner and RN.  Comprehensive assessment of the patient's malignancy, staging, need for surgery, chemotherapy, radiation therapy, and need for further testing were reviewed. Supportive measures, both inpatient and following discharge were also discussed. The recommended plan of care is documented. Greater than 35 minutes were spent correlating and coordinating this patient's care.

## 2019-09-24 NOTE — Telephone Encounter (Signed)
Fax referral and records to [facility]

## 2019-09-24 NOTE — Telephone Encounter (Signed)
I spoke with the patient about our review of her outside MRI at our tumor conference this morning.  There is concern on multiple images there may be a loss of the fat plane between both the mass and the bladder as well as the mass and the colon posteriorly.  Given proximity to the ureteral orifices, we discussed benefit of ureteral stents at the time of surgery if there is bladder invasion.  I sent a message to the patient's urologist to ask if the stents that she has in place currently could be left until after surgery.  Given possible bowel involvement and plan for robotic surgery, I discussed with the patient reaching out to one of the general surgeons to see if we could schedule a joint surgery if she does need a bowel resection.  The patient understands that this will likely delay her surgery.  We will put the surgery date on hold for now until we have confirmed Dr. Marcello Moores' availability.  My office will also place a referral for the patient to be seen by Dr. Marcello Moores.  Jeral Pinch MD Gynecologic Oncology

## 2019-09-25 NOTE — Progress Notes (Addendum)
DUE TO COVID-19 ONLY ONE VISITOR IS ALLOWED TO COME WITH YOU AND STAY IN THE WAITING ROOM ONLY DURING PRE OP AND PROCEDURE DAY OF SURGERY. THE 1 VISITOR MAY VISIT WITH YOU AFTER SURGERY IN YOUR PRIVATE ROOM DURING VISITING HOURS ONLY!  YOU NEED TO HAVE A COVID 19 TEST ON__5/12/_____ @___200pm____ , THIS TEST MUST BE DONE BEFORE SURGERY, COME  [name] , [id number].  ([place]) ONCE YOUR COVID TEST IS COMPLETED, PLEASE BEGIN THE QUARANTINE INSTRUCTIONS AS OUTLINED IN YOUR HANDOUT.                [name]  09/25/2019   Your procedure is scheduled on:  09/28/19  Report to [facility] Main  Entrance   Report to admitting at   215pm     Call this number if you have problems the morning of surgery [phone]    Remember: Do not eat food   :After Midnight. BRUSH YOUR TEETH MORNING OF SURGERY AND RINSE YOUR MOUTH OUT, NO CHEWING GUM CANDY OR MINTS. May have clear liquids from 4midnite until 115pm day of surgery.      CLEAR LIQUID DIET   Foods Allowed                                                                     Foods Excluded  Coffee and tea, regular and decaf                             liquids that you cannot  Plain Jell-O any favor except red or purple                                           see through such as: Fruit ices (not with fruit pulp)                                     milk, soups, orange juice  Iced Popsicles                                    All solid food Carbonated beverages, regular and diet                                    Cranberry, grape and apple juices Sports drinks like Gatorade Lightly seasoned clear broth or consume(fat free) Sugar, honey syrup  Sample Menu Breakfast                                Lunch                                     Supper Cranberry juice  Beef broth                            Chicken broth Jell-O                                     Grape juice                            Apple juice Coffee or tea                        Jell-O                                      Popsicle                                                Coffee or tea                        Coffee or tea  _____________________________________________________________________       Take these medicines the morning of surgery with A SIP OF WATER:  Nebulizer if needed, Inhalers as usual and bring,, Xanax if needed, Buspar, Nexium, Prozac, Synthroid                                  You may not have any metal on your body including hair pins and              piercings  Do not wear jewelry, make-up, lotions, powders or perfumes, deodorant             Do not wear nail polish on your fingernails.  Do not shave  48 hours prior to surgery.              Men may shave face and neck.   Do not bring valuables to the hospital. [place].  Contacts, dentures or bridgework may not be worn into surgery.  Leave suitcase in the car. After surgery it may be brought to your room.     Patients discharged the day of surgery will not be allowed to drive home. IF YOU ARE HAVING SURGERY AND GOING HOME THE SAME DAY, YOU MUST HAVE AN ADULT TO DRIVE YOU HOME AND BE WITH YOU FOR 24 HOURS. YOU MAY GO HOME BY TAXI OR UBER OR ORTHERWISE, BUT AN ADULT MUST ACCOMPANY YOU HOME AND STAY WITH YOU FOR 24 HOURS.  Name and phone number of your driver:  Special Instructions: N/A              Please read over the following fact sheets you were given: _____________________________________________________________________             [facility] - Preparing for Surgery Before surgery, you can play an important role.  Because skin is not sterile, your skin needs to be as free of germs as possible.  You can reduce the number of germs on your skin by washing with CHG (chlorahexidine gluconate) soap before surgery.  CHG is an antiseptic cleaner which kills germs and  bonds with the skin to continue killing germs even after washing. Please DO NOT use if you have an allergy to CHG or antibacterial soaps.  If your skin becomes reddened/irritated stop using the CHG and inform your nurse when you arrive at Short Stay. Do not shave (including legs and underarms) for at least 48 hours prior to the first CHG shower.  You may shave your face/neck. Please follow these instructions carefully:  1.  Shower with CHG Soap the night before surgery and the  morning of Surgery.  2.  If you choose to wash your hair, wash your hair first as usual with your  normal  shampoo.  3.  After you shampoo, rinse your hair and body thoroughly to remove the  shampoo.                           4.  Use CHG as you would any other liquid soap.  You can apply chg directly  to the skin and wash                       Gently with a scrungie or clean washcloth.  5.  Apply the CHG Soap to your body ONLY FROM THE NECK DOWN.   Do not use on face/ open                           Wound or open sores. Avoid contact with eyes, ears mouth and genitals (private parts).                       Wash face,  Genitals (private parts) with your normal soap.             6.  Wash thoroughly, paying special attention to the area where your surgery  will be performed.  7.  Thoroughly rinse your body with warm water from the neck down.  8.  DO NOT shower/wash with your normal soap after using and rinsing off  the CHG Soap.                9.  Pat yourself dry with a clean towel.            10.  Wear clean pajamas.            11.  Place clean sheets on your bed the night of your first shower and do not  sleep with pets. Day of Surgery : Do not apply any lotions/deodorants the morning of surgery.  Please wear clean clothes to the hospital/surgery center.  FAILURE TO FOLLOW THESE INSTRUCTIONS MAY RESULT IN THE CANCELLATION OF YOUR SURGERY PATIENT SIGNATURE_________________________________  NURSE  SIGNATURE__________________________________  ________________________________________________________________________

## 2019-09-26 ENCOUNTER — Encounter (HOSPITAL_COMMUNITY)
Admission: RE | Admit: 2019-09-26 | Discharge: 2019-09-26 | Disposition: A | Discharge status: Home / Self Care | Payer: Medicare Other | Source: Ambulatory Visit | Attending: Urology | Admitting: Urology

## 2019-09-26 ENCOUNTER — Encounter (HOSPITAL_COMMUNITY)
Admission: RE | Admit: 2019-09-26 | Discharge: 2019-09-26 | Disposition: A | Discharge status: Home / Self Care | Payer: Medicare Other | Source: Ambulatory Visit | Attending: Gynecologic Oncology | Admitting: Gynecologic Oncology

## 2019-09-26 ENCOUNTER — Other Ambulatory Visit (HOSPITAL_COMMUNITY)
Admission: RE | Admit: 2019-09-26 | Discharge: 2019-09-26 | Disposition: A | Discharge status: Home / Self Care | Payer: Medicare Other | Source: Ambulatory Visit | Attending: Urology | Admitting: Urology

## 2019-09-26 ENCOUNTER — Other Ambulatory Visit: Payer: Self-pay

## 2019-09-26 ENCOUNTER — Encounter (HOSPITAL_COMMUNITY): Payer: Self-pay

## 2019-09-26 DIAGNOSIS — Z20822 Contact with and (suspected) exposure to covid-19: Secondary | ICD-10-CM | POA: Diagnosis not present

## 2019-09-26 DIAGNOSIS — Z01812 Encounter for preprocedural laboratory examination: Secondary | ICD-10-CM | POA: Insufficient documentation

## 2019-09-26 HISTORY — DX: Unspecified osteoarthritis, unspecified site: M19.90

## 2019-09-26 HISTORY — DX: Personal history of other medical treatment: Z92.89

## 2019-09-26 HISTORY — DX: Pneumonia, unspecified organism: J18.9

## 2019-09-26 LAB — SARS CORONAVIRUS 2 (TAT 6-24 HRS): SARS Coronavirus 2: NEGATIVE

## 2019-09-26 LAB — BASIC METABOLIC PANEL
Anion gap: 9 (ref 5–15)
BUN: 27 mg/dL — ABNORMAL HIGH (ref 8–23)
CO2: 24 mmol/L (ref 22–32)
Calcium: 10.2 mg/dL (ref 8.9–10.3)
Chloride: 103 mmol/L (ref 98–111)
Creatinine, Ser: 1.36 mg/dL — ABNORMAL HIGH (ref 0.44–1.00)
GFR calc Af Amer: 46 mL/min — ABNORMAL LOW (ref >60)
GFR calc non Af Amer: 40 mL/min — ABNORMAL LOW (ref >60)
Glucose, Bld: 118 mg/dL — ABNORMAL HIGH (ref 70–99)
Potassium: 4.1 mmol/L (ref 3.5–5.1)
Sodium: 136 mmol/L (ref 135–145)

## 2019-09-26 LAB — CBC
HCT: 38.8 % (ref 36.0–46.0)
Hemoglobin: 12.5 g/dL (ref 12.0–15.0)
MCH: 30.6 pg (ref 26.0–34.0)
MCHC: 32.2 g/dL (ref 30.0–36.0)
MCV: 95.1 fL (ref 80.0–100.0)
Platelets: 406 10*3/uL — ABNORMAL HIGH (ref 150–400)
RBC: 4.08 MIL/uL (ref 3.87–5.11)
RDW: 13.3 % (ref 11.5–15.5)
WBC: 8.9 10*3/uL (ref 4.0–10.5)
nRBC: 0 % (ref 0.0–0.2)

## 2019-09-27 MED ORDER — GENTAMICIN SULFATE 40 MG/ML IJ SOLN
5.0000 mg/kg | INTRAVENOUS | Status: AC
  Administered 2019-09-28: 340 mg via INTRAVENOUS
  Filled 2019-09-27: qty 8.5

## 2019-09-28 ENCOUNTER — Encounter (HOSPITAL_COMMUNITY): Payer: Self-pay | Admitting: Urology

## 2019-09-28 ENCOUNTER — Ambulatory Visit (HOSPITAL_COMMUNITY): Payer: Medicare Other

## 2019-09-28 ENCOUNTER — Ambulatory Visit (HOSPITAL_COMMUNITY): Payer: Medicare Other | Admitting: Certified Registered Nurse Anesthetist

## 2019-09-28 ENCOUNTER — Ambulatory Visit (HOSPITAL_COMMUNITY)
Admission: RE | Admit: 2019-09-28 | Discharge: 2019-09-28 | Disposition: A | Discharge status: Home / Self Care | Payer: Medicare Other | Attending: Urology | Admitting: Urology

## 2019-09-28 ENCOUNTER — Encounter (HOSPITAL_COMMUNITY)
Admission: RE | Disposition: A | Discharge status: Home / Self Care | Payer: Self-pay | Source: Home / Self Care | Attending: Urology

## 2019-09-28 ENCOUNTER — Other Ambulatory Visit: Payer: Self-pay

## 2019-09-28 DIAGNOSIS — M1990 Unspecified osteoarthritis, unspecified site: Secondary | ICD-10-CM | POA: Insufficient documentation

## 2019-09-28 DIAGNOSIS — M797 Fibromyalgia: Secondary | ICD-10-CM | POA: Diagnosis not present

## 2019-09-28 DIAGNOSIS — N200 Calculus of kidney: Secondary | ICD-10-CM | POA: Diagnosis not present

## 2019-09-28 DIAGNOSIS — Z881 Allergy status to other antibiotic agents status: Secondary | ICD-10-CM | POA: Diagnosis not present

## 2019-09-28 DIAGNOSIS — J4530 Mild persistent asthma, uncomplicated: Secondary | ICD-10-CM | POA: Diagnosis not present

## 2019-09-28 DIAGNOSIS — Z86718 Personal history of other venous thrombosis and embolism: Secondary | ICD-10-CM | POA: Diagnosis not present

## 2019-09-28 DIAGNOSIS — E039 Hypothyroidism, unspecified: Secondary | ICD-10-CM | POA: Insufficient documentation

## 2019-09-28 DIAGNOSIS — D631 Anemia in chronic kidney disease: Secondary | ICD-10-CM | POA: Diagnosis not present

## 2019-09-28 DIAGNOSIS — Z8261 Family history of arthritis: Secondary | ICD-10-CM | POA: Insufficient documentation

## 2019-09-28 DIAGNOSIS — Z885 Allergy status to narcotic agent status: Secondary | ICD-10-CM | POA: Insufficient documentation

## 2019-09-28 DIAGNOSIS — K219 Gastro-esophageal reflux disease without esophagitis: Secondary | ICD-10-CM | POA: Insufficient documentation

## 2019-09-28 DIAGNOSIS — I129 Hypertensive chronic kidney disease with stage 1 through stage 4 chronic kidney disease, or unspecified chronic kidney disease: Secondary | ICD-10-CM | POA: Insufficient documentation

## 2019-09-28 DIAGNOSIS — N183 Chronic kidney disease, stage 3 unspecified: Secondary | ICD-10-CM | POA: Insufficient documentation

## 2019-09-28 DIAGNOSIS — E785 Hyperlipidemia, unspecified: Secondary | ICD-10-CM | POA: Insufficient documentation

## 2019-09-28 DIAGNOSIS — K589 Irritable bowel syndrome without diarrhea: Secondary | ICD-10-CM | POA: Insufficient documentation

## 2019-09-28 DIAGNOSIS — Z8249 Family history of ischemic heart disease and other diseases of the circulatory system: Secondary | ICD-10-CM | POA: Insufficient documentation

## 2019-09-28 HISTORY — PX: CYSTOSCOPY W/ URETERAL STENT REMOVAL: SHX1430

## 2019-09-28 HISTORY — PX: CYSTOSCOPY/URETEROSCOPY/HOLMIUM LASER/STENT PLACEMENT: SHX6546

## 2019-09-28 SURGERY — CYSTOSCOPY/URETEROSCOPY/HOLMIUM LASER/STENT PLACEMENT
Anesthesia: General | Laterality: Right

## 2019-09-28 MED ORDER — CHLORHEXIDINE GLUCONATE 0.12 % MT SOLN
15.0000 mL | Freq: Once | OROMUCOSAL | Status: AC
  Administered 2019-09-28: 15 mL via OROMUCOSAL

## 2019-09-28 MED ORDER — FENTANYL CITRATE (PF) 100 MCG/2ML IJ SOLN
INTRAMUSCULAR | Status: AC
  Filled 2019-09-28: qty 2

## 2019-09-28 MED ORDER — CIPROFLOXACIN HCL 500 MG PO TABS
500.0000 mg | ORAL_TABLET | Freq: Two times a day (BID) | ORAL | 0 refills | Status: DC
Start: 2019-09-28 — End: 2019-11-05

## 2019-09-28 MED ORDER — PROPOFOL 10 MG/ML IV BOLUS
INTRAVENOUS | Status: AC
  Filled 2019-09-28: qty 20

## 2019-09-28 MED ORDER — FENTANYL CITRATE (PF) 100 MCG/2ML IJ SOLN
INTRAMUSCULAR | Status: DC | PRN
  Administered 2019-09-28: 25 ug via INTRAVENOUS
  Administered 2019-09-28: 50 ug via INTRAVENOUS
  Administered 2019-09-28: 25 ug via INTRAVENOUS

## 2019-09-28 MED ORDER — SODIUM CHLORIDE 0.9 % IR SOLN
Status: DC | PRN
  Administered 2019-09-28: 3000 mL via INTRAVESICAL

## 2019-09-28 MED ORDER — EPHEDRINE SULFATE-NACL 50-0.9 MG/10ML-% IV SOSY
PREFILLED_SYRINGE | INTRAVENOUS | Status: DC | PRN
  Administered 2019-09-28: 10 mg via INTRAVENOUS

## 2019-09-28 MED ORDER — IOHEXOL 300 MG/ML  SOLN
INTRAMUSCULAR | Status: DC | PRN
  Administered 2019-09-28: 17 mL via URETHRAL

## 2019-09-28 MED ORDER — LACTATED RINGERS IV SOLN: INTRAVENOUS | Status: DC

## 2019-09-28 MED ORDER — MIDAZOLAM HCL 2 MG/2ML IJ SOLN
INTRAMUSCULAR | Status: AC
  Filled 2019-09-28: qty 2

## 2019-09-28 MED ORDER — LIDOCAINE 2% (20 MG/ML) 5 ML SYRINGE
INTRAMUSCULAR | Status: AC
  Filled 2019-09-28: qty 5

## 2019-09-28 MED ORDER — KETOROLAC TROMETHAMINE 30 MG/ML IJ SOLN
INTRAMUSCULAR | Status: AC
  Filled 2019-09-28: qty 1

## 2019-09-28 MED ORDER — LIDOCAINE 2% (20 MG/ML) 5 ML SYRINGE
INTRAMUSCULAR | Status: DC | PRN
  Administered 2019-09-28: 80 mg via INTRAVENOUS

## 2019-09-28 MED ORDER — PROPOFOL 10 MG/ML IV BOLUS
INTRAVENOUS | Status: DC | PRN
  Administered 2019-09-28: 200 mg via INTRAVENOUS

## 2019-09-28 MED ORDER — ONDANSETRON HCL 4 MG/2ML IJ SOLN
INTRAMUSCULAR | Status: DC | PRN
  Administered 2019-09-28: 4 mg via INTRAVENOUS

## 2019-09-28 MED ORDER — ONDANSETRON HCL 4 MG/2ML IJ SOLN
INTRAMUSCULAR | Status: AC
  Filled 2019-09-28: qty 2

## 2019-09-28 MED ORDER — DEXAMETHASONE SODIUM PHOSPHATE 10 MG/ML IJ SOLN
INTRAMUSCULAR | Status: AC
  Filled 2019-09-28: qty 1

## 2019-09-28 MED ORDER — KETOROLAC TROMETHAMINE 30 MG/ML IJ SOLN
INTRAMUSCULAR | Status: DC | PRN
Start: 2019-09-28 — End: 2019-09-28
  Administered 2019-09-28: 30 mg via INTRAVENOUS

## 2019-09-28 MED ORDER — DEXAMETHASONE SODIUM PHOSPHATE 10 MG/ML IJ SOLN
INTRAMUSCULAR | Status: DC | PRN
  Administered 2019-09-28: 10 mg via INTRAVENOUS

## 2019-09-28 MED ORDER — MIDAZOLAM HCL 5 MG/5ML IJ SOLN
INTRAMUSCULAR | Status: DC | PRN
  Administered 2019-09-28: 2 mg via INTRAVENOUS

## 2019-09-28 MED ORDER — PHENYLEPHRINE 40 MCG/ML (10ML) SYRINGE FOR IV PUSH (FOR BLOOD PRESSURE SUPPORT)
PREFILLED_SYRINGE | INTRAVENOUS | Status: AC
  Filled 2019-09-28: qty 10

## 2019-09-28 SURGICAL SUPPLY — 22 items
BAG URO CATCHER STRL LF (MISCELLANEOUS) ×3 IMPLANT
BASKET LASER NITINOL 1.9FR (BASKET) ×1 IMPLANT
BASKET STONE NCOMPASS (UROLOGICAL SUPPLIES) ×1 IMPLANT
BSKT STON RTRVL 120 1.9FR (BASKET) ×2
CATH INTERMIT  6FR 70CM (CATHETERS) ×3 IMPLANT
CLOTH BEACON ORANGE TIMEOUT ST (SAFETY) ×3 IMPLANT
EXTRACTOR STONE 1.7FRX115CM (UROLOGICAL SUPPLIES) IMPLANT
FIBER LASER FLEXIVA 365 (UROLOGICAL SUPPLIES) IMPLANT
FIBER LASER TRAC TIP (UROLOGICAL SUPPLIES) IMPLANT
GLOVE BIOGEL M STRL SZ7.5 (GLOVE) ×5 IMPLANT
GOWN STRL REUS W/TWL LRG LVL3 (GOWN DISPOSABLE) ×4 IMPLANT
GUIDEWIRE ANG ZIPWIRE 038X150 (WIRE) ×3 IMPLANT
GUIDEWIRE STR DUAL SENSOR (WIRE) ×3 IMPLANT
KIT TURNOVER KIT A (KITS) ×1 IMPLANT
MANIFOLD NEPTUNE II (INSTRUMENTS) ×3 IMPLANT
SHEATH URETERAL 12FRX28CM (UROLOGICAL SUPPLIES) ×1 IMPLANT
SHEATH URETERAL 12FRX35CM (MISCELLANEOUS) IMPLANT
STENT POLARIS 5FRX24 (STENTS) ×1 IMPLANT
TRAY CYSTO PACK (CUSTOM PROCEDURE TRAY) ×3 IMPLANT
TUBE FEEDING 8FR 16IN STR KANG (MISCELLANEOUS) ×3 IMPLANT
TUBING CONNECTING 10 (TUBING) ×3 IMPLANT
TUBING UROLOGY SET (TUBING) ×3 IMPLANT

## 2019-09-28 NOTE — Brief Op Note (Signed)
09/28/2019  4:12 PM  PATIENT:  [name]  69 y.o. female  PRE-OPERATIVE DIAGNOSIS:  RESIDUAL LEFT RENAL STONES  POST-OPERATIVE DIAGNOSIS:  residual left renal stones  PROCEDURE:  Procedure(s) with comments: CYSTOSCOPY/URETEROSCOPY/BASKET STONE REMOVAL/STENT PLACEMENT (Left) - 1HR CYSTOSCOPY WITH STENT REMOVAL (Right)  SURGEON:  Surgeon(s) and Role:    Alexis Frock, MD - Primary  PHYSICIAN ASSISTANT:   ASSISTANTS: none   ANESTHESIA:   general  EBL:  Minimal    BLOOD ADMINISTERED:none  DRAINS: none   LOCAL MEDICATIONS USED:  NONE  SPECIMEN:  Source of Specimen:  left residual renal stone fragments  DISPOSITION OF SPECIMEN:  given to patient  COUNTS:  YES  TOURNIQUET:  * No tourniquets in log *  DICTATION: .Other Dictation: Dictation Number  [id number]  PLAN OF CARE: Discharge to home after PACU  PATIENT DISPOSITION:  PACU - hemodynamically stable.   Delay start of Pharmacological VTE agent (>24hrs) due to surgical blood loss or risk of bleeding: yes

## 2019-09-28 NOTE — Anesthesia Preprocedure Evaluation (Addendum)
Anesthesia Evaluation  Patient identified by MRN, date of birth, ID band Patient awake    Reviewed: Allergy & Precautions, NPO status , Patient's Chart, lab work & pertinent test results  History of Anesthesia Complications Negative for: history of anesthetic complications  Airway Mallampati: II  TM Distance: >3 FB Neck ROM: Full    Dental  (+) Dental Advisory Given   Pulmonary asthma ,    breath sounds clear to auscultation       Cardiovascular hypertension,  Rhythm:Regular     Neuro/Psych  Headaches, Anxiety Depression Bipolar Disorder Dementia  Neuromuscular disease    GI/Hepatic GERD  Medicated and Controlled,  Endo/Other  Hypothyroidism   Renal/GU Renal disease     Musculoskeletal  (+) Arthritis , Fibromyalgia -  Abdominal   Peds  Hematology  (+) anemia ,   Anesthesia Other Findings   Reproductive/Obstetrics                            Anesthesia Physical Anesthesia Plan  ASA: III  Anesthesia Plan: General   Post-op Pain Management:    Induction: Intravenous  PONV Risk Score and Plan: 3 and Ondansetron and Dexamethasone  Airway Management Planned: LMA  Additional Equipment: None  Intra-op Plan:   Post-operative Plan: Extubation in OR  Informed Consent: I have reviewed the patients History and Physical, chart, labs and discussed the procedure including the risks, benefits and alternatives for the proposed anesthesia with the patient or authorized representative who has indicated his/her understanding and acceptance.     Dental advisory given  Plan Discussed with: CRNA and Surgeon  Anesthesia Plan Comments:         Anesthesia Quick Evaluation

## 2019-09-28 NOTE — Anesthesia Procedure Notes (Signed)
Procedure Name: LMA Insertion Date/Time: 09/28/2019 3:13 PM Performed by: Mitzie Na, CRNA Pre-anesthesia Checklist: Patient identified, Emergency Drugs available, Suction available and Patient being monitored Patient Re-evaluated:Patient Re-evaluated prior to induction Oxygen Delivery Method: Circle system utilized Preoxygenation: Pre-oxygenation with 100% oxygen Induction Type: IV induction LMA: LMA inserted LMA Size: 3.0 Number of attempts: 1 Airway Equipment and Method: Stylet and Oral airway Placement Confirmation: positive ETCO2 and breath sounds checked- equal and bilateral Tube secured with: Tape Dental Injury: Teeth and Oropharynx as per pre-operative assessment

## 2019-09-28 NOTE — Transfer of Care (Signed)
Immediate Anesthesia Transfer of Care Note  Patient: [name]  Procedure(s) Performed: CYSTOSCOPY/URETEROSCOPY/BASKET STONE REMOVAL/STENT PLACEMENT (Left ) CYSTOSCOPY WITH STENT REMOVAL (Right )  Patient Location: PACU  Anesthesia Type:General  Level of Consciousness: awake, alert  and oriented  Airway & Oxygen Therapy: Patient Spontanous Breathing and Patient connected to face mask oxygen  Post-op Assessment: Report given to RN and Post -op Vital signs reviewed and stable  Post vital signs: Reviewed and stable  Last Vitals:  Vitals Value Taken Time  BP 131/84 09/28/19 1620  Temp    Pulse 88 09/28/19 1621  Resp 11 09/28/19 1621  SpO2 100 % 09/28/19 1621  Vitals shown include unvalidated device data.  Last Pain:  Vitals:   09/28/19 1250  TempSrc:   PainSc: 4       Patients Stated Pain Goal: 5 (21/03/12 8118)  Complications: No apparent anesthesia complications

## 2019-09-28 NOTE — Anesthesia Postprocedure Evaluation (Signed)
Anesthesia Post Note  Patient: Bree J Masters-Marks  Procedure(s) Performed: CYSTOSCOPY/URETEROSCOPY/BASKET STONE REMOVAL/STENT PLACEMENT (Left ) CYSTOSCOPY WITH STENT REMOVAL (Right )     Patient location during evaluation: PACU Anesthesia Type: General Level of consciousness: awake and alert Pain management: pain level controlled Vital Signs Assessment: post-procedure vital signs reviewed and stable Respiratory status: spontaneous breathing, nonlabored ventilation, respiratory function stable and patient connected to nasal cannula oxygen Cardiovascular status: blood pressure returned to baseline and stable Postop Assessment: no apparent nausea or vomiting Anesthetic complications: no    Last Vitals:  Vitals:   09/28/19 1645 09/28/19 1700  BP: 135/76 (!) 160/81  Pulse: 77 83  Resp: 15 18  Temp: 36.7 C 36.7 C  SpO2: 98% 100%    Last Pain:  Vitals:   09/28/19 1700  TempSrc:   PainSc: 0-No pain                 Beyonca Wisz

## 2019-09-28 NOTE — Discharge Instructions (Signed)
1 - You may have urinary urgency (bladder spasms) and bloody urine on / off with stent in place. This is normal. ° °2 - Call MD or go to ER for fever >102, severe pain / nausea / vomiting not relieved by medications, or acute change in medical status ° °
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
## 2019-09-29 NOTE — Op Note (Signed)
NAME: [name], [name] MEDICAL RECORD [id number] ACCOUNT [id number] DATE OF BIRTH:October 29, 1950 FACILITY: [facility] LOCATION: [facility] PHYSICIAN:Caileigh Canche Tresa Moore, MD  OPERATIVE REPORT  DATE OF PROCEDURE:  09/28/2019  PREOPERATIVE DIAGNOSES:  Residual left renal stone, history of right renal stone, recurrent infections.  PROCEDURE: 1.  Cystoscopy with right ureteral stent removal. 2.  Left ureteroscopy with basketing of stones. 3.  Exchange of left ureteral stent, 5 x 24 Polaris, no tether. 4.  Left retrograde pyelogram interpretation.  ESTIMATED BLOOD LOSS:  Nil.  COMPLICATIONS:  None.  SPECIMEN:  Small volume left residual renal stone fragments given to patient.  FINDINGS:   1.  Unremarkable urinary bladder. 2.  Complete removal of right ureteral stent. 3.  Unremarkable left retrograde pyelogram. 4.  Successful exchange of left ureteral stent, proximal end in renal pelvis, distal end in urinary bladder. 5.  Approximately 8 mm total volume residual left stones.  There was complete removal of all accessible stone fragments larger than 1/3 mm following basket extraction on the left side.  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  She had a recent bout of ureteral stones for which she was stented and then has been on a path  to get stone free.  She underwent first-stage bilateral ureteroscopy several weeks ago, at which point her right side was rendered stone free and the majority of her left-sided stone was removed; however, given the significant volume of the left-sided  stone it was felt that 2nd-stage procedure would be warranted with a goal of stone free and she wished to proceed.  Informed consent was then placed in the medical record.  DESCRIPTION OF PROCEDURE:  Patient being identified as patient, procedure being cystoscopy, right stent pull, left second-stage  ureteroscopy with stent exchange confirmed.  Procedure timeout was performed.  General LMA anesthesia induced.  The patient  was placed into a low lithotomy position, sterile field was created prepped and draped the patient's vagina, introitus and proximal thighs using iodine.  Cystourethroscopy was then performed using 21-French rigid cystoscope with offset lens.  Inspection  of bladder revealed distal end of bilateral ureteral stents in situ.  The distal end of the right stent was grasped and brought out in its entirety, inspected intact and set aside for discard.  Distal end of the left stent was grasped, brought to the  level of the urethral meatus.  The 0.03 ZIPwire was advanced to the level of the upper pole, exchanged for a 6-French renal catheter and left retrograde pyelogram was obtained.  Left retrograde pyelogram does a show single left ureter, single system left kidney.  No obvious filling defects or narrowing noted.  The ZIPwire was once again advanced and set aside as a safety wire.  An 8-French feeding tube was placed in the urinary  bladder for pressure release, and semirigid ureteroscopy was performed of the entire length of the left ureter alongside a separate sensor working wire.  There was a very, very small volume tiny, less than 1/3 mm calcifications noted within the ureter.   These were easily flushed.  The semirigid scope was exchanged for a 12/14 28 cm ureteral access sheath to the level of the proximal ureter using continuous fluoroscopic guidance and flexible digital ureteroscopy performed the proximal left ureter  systematic fashion, left kidney, including all calices x3.  There were multiple small volume renal stone fragments mostly in the lower pole.  Estimated total  volume approximately 8 mm.  Most of these were amenable to simple basketing with an escape type  basket and they were removed and set aside to be given to patient.  There was then only very small sized fragments  left.  An encompass type basket was used to not as much of these as possible such that there was complete resolution of all accessible  stone fragments larger than 1/3 mm.  Access sheath was removed under continuous vision, no mucosal abnormalities were found, and a new 5 x 24 Polaris-type stent was placed remaining safety wire using fluoroscopic guidance.  Good proximal and distal plane  were noted.  No tether was left in place.  Bladder was emptied.  Procedure was terminated.  The patient tolerated the procedure well.  No immediate complications.  The patient was taken to postanesthesia care in stable condition.  CN/NUANCE  D:09/28/2019 T:09/29/2019 JOB:[id number]

## 2019-10-01 ENCOUNTER — Encounter (HOSPITAL_COMMUNITY)
Admission: RE | Admit: 2019-10-01 | Discharge: 2019-10-01 | Disposition: A | Discharge status: Home / Self Care | Payer: Medicare Other | Source: Ambulatory Visit | Attending: Gynecologic Oncology | Admitting: Gynecologic Oncology

## 2019-10-01 ENCOUNTER — Other Ambulatory Visit: Payer: Self-pay

## 2019-10-01 DIAGNOSIS — Z79899 Other long term (current) drug therapy: Secondary | ICD-10-CM | POA: Diagnosis not present

## 2019-10-01 DIAGNOSIS — C499 Malignant neoplasm of connective and soft tissue, unspecified: Secondary | ICD-10-CM | POA: Diagnosis not present

## 2019-10-01 DIAGNOSIS — Z7982 Long term (current) use of aspirin: Secondary | ICD-10-CM | POA: Insufficient documentation

## 2019-10-01 DIAGNOSIS — I129 Hypertensive chronic kidney disease with stage 1 through stage 4 chronic kidney disease, or unspecified chronic kidney disease: Secondary | ICD-10-CM | POA: Diagnosis not present

## 2019-10-01 DIAGNOSIS — N183 Chronic kidney disease, stage 3 unspecified: Secondary | ICD-10-CM | POA: Insufficient documentation

## 2019-10-01 LAB — GLUCOSE, CAPILLARY: Glucose-Capillary: 103 mg/dL — ABNORMAL HIGH (ref 70–99)

## 2019-10-01 MED ORDER — FLUDEOXYGLUCOSE F - 18 (FDG) INJECTION: 7.3000 | Freq: Once | INTRAVENOUS | Status: DC | PRN

## 2019-10-02 ENCOUNTER — Inpatient Hospital Stay (HOSPITAL_COMMUNITY): Admission: RE | Admit: 2019-10-02 | Payer: Medicare Other | Source: Ambulatory Visit

## 2019-10-03 ENCOUNTER — Other Ambulatory Visit: Payer: Self-pay | Admitting: Gynecologic Oncology

## 2019-10-03 ENCOUNTER — Encounter (HOSPITAL_COMMUNITY): Payer: Medicare Other

## 2019-10-03 DIAGNOSIS — R1900 Intra-abdominal and pelvic swelling, mass and lump, unspecified site: Secondary | ICD-10-CM

## 2019-10-03 DIAGNOSIS — C499 Malignant neoplasm of connective and soft tissue, unspecified: Secondary | ICD-10-CM

## 2019-10-08 ENCOUNTER — Ambulatory Visit: Payer: Self-pay | Admitting: General Surgery

## 2019-10-08 DIAGNOSIS — C499 Malignant neoplasm of connective and soft tissue, unspecified: Secondary | ICD-10-CM | POA: Diagnosis not present

## 2019-10-08 DIAGNOSIS — N202 Calculus of kidney with calculus of ureter: Secondary | ICD-10-CM | POA: Diagnosis not present

## 2019-10-08 DIAGNOSIS — N3020 Other chronic cystitis without hematuria: Secondary | ICD-10-CM | POA: Diagnosis not present

## 2019-10-08 MED ORDER — DEXTROSE 5 % IV SOLN: 2.0000 g | INTRAVENOUS | Status: AC

## 2019-10-09 ENCOUNTER — Inpatient Hospital Stay: Admit: 2019-10-09 | Payer: Medicare Other | Admitting: Gynecologic Oncology

## 2019-10-09 SURGERY — SALPINGO-OOPHORECTOMY, BILATERAL, ROBOT-ASSISTED
Anesthesia: General

## 2019-10-10 ENCOUNTER — Ambulatory Visit (INDEPENDENT_AMBULATORY_CARE_PROVIDER_SITE_OTHER): Payer: Medicare Other | Admitting: Psychology

## 2019-10-10 DIAGNOSIS — F4321 Adjustment disorder with depressed mood: Secondary | ICD-10-CM

## 2019-10-11 ENCOUNTER — Other Ambulatory Visit: Payer: Self-pay | Admitting: Urology

## 2019-10-11 ENCOUNTER — Telehealth: Payer: Self-pay | Admitting: Gynecologic Oncology

## 2019-10-11 ENCOUNTER — Telehealth: Payer: Self-pay | Admitting: *Deleted

## 2019-10-11 NOTE — Telephone Encounter (Signed)
Left message for the surgery schedulers with [facility] about the patient needing stent placement for her surgery on October 30, 2019 at [facility]. Patient's urologist is Dr. Clint Lipps.  Patient called earlier today stating that Dr. Tresa Moore told her to have whoever was on call place stents the day of her surgery.

## 2019-10-11 NOTE — Telephone Encounter (Signed)
Returned patient's call regarding surgery questions. Explained to the patient that "your surgery is at the main OR in the main hospital. Also explained that her stay will be anywhere from day to a couple days." Patient stated "I saw Dr Marcello Moores already and have my bowel prep. The urologist has seen me and took out my stents. He said to have Dr Berline Lopes just put in the orders for the ones she needs and the urologist on call will place at the beginning of surgery."  Message forwarded to [facility] APP and Dr Berline Lopes

## 2019-10-16 ENCOUNTER — Ambulatory Visit (INDEPENDENT_AMBULATORY_CARE_PROVIDER_SITE_OTHER): Payer: Medicare Other | Admitting: Psychology

## 2019-10-16 DIAGNOSIS — F4321 Adjustment disorder with depressed mood: Secondary | ICD-10-CM | POA: Diagnosis not present

## 2019-10-17 ENCOUNTER — Inpatient Hospital Stay: Payer: Medicare Other | Admitting: Gynecologic Oncology

## 2019-10-17 ENCOUNTER — Encounter: Payer: Self-pay | Admitting: Cardiology

## 2019-10-22 NOTE — Patient Instructions (Addendum)
DUE TO COVID-19 ONLY ONE VISITOR IS ALLOWED TO COME WITH YOU AND STAY IN THE WAITING ROOM ONLY DURING PRE OP AND PROCEDURE DAY OF SURGERY. TWo VISITOR MAY VISIT WITH YOU AFTER SURGERY IN YOUR PRIVATE ROOM DURING VISITING HOURS ONLY! 10a-8p  YOU NEED TO HAVE A COVID 19 TEST ON___6-11-21____ @___2 :05 pm____, THIS TEST MUST BE DONE BEFORE SURGERY, COME  [address], [place] , [address].  ([place]) ONCE YOUR COVID TEST IS COMPLETED, PLEASE BEGIN THE QUARANTINE INSTRUCTIONS AS OUTLINED IN YOUR HANDOUT.                [name]  10/22/2019   Your procedure is scheduled on: 10-30-19   Report to [facility] Main  Entrance   Report to short stay   at      0530  AM     Call this number if you have problems the morning of surgery [phone]    Remember: Follow bowel prep and diet per MD order   [name], NO [place].     Take these medicines the morning of surgery with A SIP OF WATER: levothyroxine, eye drops, prozac, buspar, nexium                                 You may not have any metal on your body including hair pins and              piercings  Do not wear jewelry, make-up, lotions, powders or perfumes, deodorant             Do not wear nail polish on your fingernails.  Do not shave  48 hours prior to surgery.           Do not bring valuables to the hospital. [name].  Contacts, dentures or bridgework may not be worn into surgery.      Patients discharged the day of surgery will not be allowed to drive home. IF YOU ARE HAVING SURGERY AND GOING HOME THE SAME DAY, YOU MUST HAVE AN ADULT TO DRIVE YOU HOME AND BE WITH YOU FOR 24 HOURS. YOU MAY GO HOME BY TAXI OR UBER OR ORTHERWISE, BUT AN ADULT MUST ACCOMPANY YOU HOME AND STAY WITH YOU FOR 24 HOURS.  Name and phone number of your driver:  Special Instructions: N/A               Please read over the following fact sheets you were given: _____________________________________________________________________             [facility] - Preparing for Surgery Before surgery, you can play an important role.  Because skin is not sterile, your skin needs to be as free of germs as possible.  You can reduce the number of germs on your skin by washing with CHG (chlorahexidine gluconate) soap before surgery.  CHG is an antiseptic cleaner which kills germs and bonds with the skin to continue killing germs even after washing. Please DO NOT use if you have an allergy to CHG or antibacterial soaps.  If your skin becomes reddened/irritated stop using the CHG and inform your nurse when you arrive at Short Stay. Do not shave (including legs and underarms) for at least  48 hours prior to the first CHG shower.  You may shave your face/neck. Please follow these instructions carefully:  1.  Shower with CHG Soap the night before surgery and the  morning of Surgery.  2.  If you choose to wash your hair, wash your hair first as usual with your  normal  shampoo.  3.  After you shampoo, rinse your hair and body thoroughly to remove the  shampoo.                           4.  Use CHG as you would any other liquid soap.  You can apply chg directly  to the skin and wash                       Gently with a scrungie or clean washcloth.  5.  Apply the CHG Soap to your body ONLY FROM THE NECK DOWN.   Do not use on face/ open                           Wound or open sores. Avoid contact with eyes, ears mouth and genitals (private parts).                       Wash face,  Genitals (private parts) with your normal soap.             6.  Wash thoroughly, paying special attention to the area where your surgery  will be performed.  7.  Thoroughly rinse your body with warm water from the neck down.  8.  DO NOT shower/wash with your normal soap after using and rinsing off  the CHG Soap.                9.  Pat  yourself dry with a clean towel.            10.  Wear clean pajamas.            11.  Place clean sheets on your bed the night of your first shower and do not  sleep with pets. Day of Surgery : Do not apply any lotions/deodorants the morning of surgery.  Please wear clean clothes to the hospital/surgery center.  FAILURE TO FOLLOW THESE INSTRUCTIONS MAY RESULT IN THE CANCELLATION OF YOUR SURGERY PATIENT SIGNATURE_________________________________  NURSE SIGNATURE__________________________________  ________________________________________________________________________   [name]  An incentive spirometer is a tool that can help keep your lungs clear and active. This tool measures how well you are filling your lungs with each breath. Taking long deep breaths may help reverse or decrease the chance of developing breathing (pulmonary) problems (especially infection) following:  A long period of time when you are unable to move or be active. BEFORE THE PROCEDURE   If the spirometer includes an indicator to show your best effort, your nurse or respiratory therapist will set it to a desired goal.  If possible, sit up straight or lean slightly forward. Try not to slouch.  Hold the incentive spirometer in an upright position. INSTRUCTIONS FOR USE  1. Sit on the edge of your bed if possible, or sit up as far as you can in bed or on a chair. 2. Hold the incentive spirometer in an upright position. 3. Breathe out normally. 4. Place the mouthpiece in your mouth and seal your lips tightly around it. 5.  Breathe in slowly and as deeply as possible, raising the piston or the ball toward the top of the column. 6. Hold your breath for 3-5 seconds or for as long as possible. Allow the piston or ball to fall to the bottom of the column. 7. Remove the mouthpiece from your mouth and breathe out normally. 8. Rest for a few seconds and repeat Steps 1 through 7 at least 10 times every 1-2 hours  when you are awake. Take your time and take a few normal breaths between deep breaths. 9. The spirometer may include an indicator to show your best effort. Use the indicator as a goal to work toward during each repetition. 10. After each set of 10 deep breaths, practice coughing to be sure your lungs are clear. If you have an incision (the cut made at the time of surgery), support your incision when coughing by placing a pillow or rolled up towels firmly against it. Once you are able to get out of bed, walk around indoors and cough well. You may stop using the incentive spirometer when instructed by your caregiver.  RISKS AND COMPLICATIONS  Take your time so you do not get dizzy or light-headed.  If you are in pain, you may need to take or ask for pain medication before doing incentive spirometry. It is harder to take a deep breath if you are having pain. AFTER USE  Rest and breathe slowly and easily.  It can be helpful to keep track of a log of your progress. Your caregiver can provide you with a simple table to help with this. If you are using the spirometer at home, follow these instructions: [place] IF:   You are having difficultly using the spirometer.  You have trouble using the spirometer as often as instructed.  Your pain medication is not giving enough relief while using the spirometer.  You develop fever of 100.5 F (38.1 C) or higher. SEEK IMMEDIATE MEDICAL CARE IF:   You cough up bloody sputum that had not been present before.  You develop fever of 102 F (38.9 C) or greater.  You develop worsening pain at or near the incision site. MAKE SURE YOU:   Understand these instructions.  Will watch your condition.  Will get help right away if you are not doing well or get worse. Document Released: 09/13/2006 Document Revised: 07/26/2011 Document Reviewed: 11/14/2006 [facility] Patient Information 2014 [place],  [place].   ________________________________________________________________________

## 2019-10-22 NOTE — Progress Notes (Signed)
PCP - Lew Dawes lov 08-27-19 epic Cardiologist - Shirlee More lov 07-25-19 epic  PPM/ICD -  Device Orders -  Rep Notified -   Chest x-ray -  EKG - 10-24-19 epic Stress Test -stress echo 2019 epic  ECHO - 2018 Cardiac Cath - 2019 epic  Sleep Study -  CPAP -   Fasting Blood Sugar -  Checks Blood Sugar _____ times a day  Blood Thinner Instructions: Aspirin Instructions:  ERAS Protcol - PRE-SURGERY Ensure or G2-   COVID TEST- 6-11   Anesthesia review:   Patient denies shortness of breath, fever, cough and chest pain at PAT appointment  none   All instructions explained to the patient, with a verbal understanding of the material. Patient agrees to go over the instructions while at home for a better understanding. Patient also instructed to self quarantine after being tested for COVID-19. The opportunity to ask questions was provided.

## 2019-10-23 ENCOUNTER — Encounter (HOSPITAL_COMMUNITY)
Admission: RE | Admit: 2019-10-23 | Discharge: 2019-10-23 | Disposition: A | Discharge status: Home / Self Care | Payer: Medicare Other | Source: Ambulatory Visit | Attending: Gynecologic Oncology | Admitting: Gynecologic Oncology

## 2019-10-23 ENCOUNTER — Other Ambulatory Visit: Payer: Self-pay

## 2019-10-23 ENCOUNTER — Encounter (HOSPITAL_COMMUNITY): Payer: Self-pay

## 2019-10-23 DIAGNOSIS — Z01818 Encounter for other preprocedural examination: Secondary | ICD-10-CM | POA: Insufficient documentation

## 2019-10-23 HISTORY — DX: Unspecified chronic bronchitis: J42

## 2019-10-23 HISTORY — DX: Respiratory tuberculosis unspecified: A15.9

## 2019-10-24 ENCOUNTER — Encounter (HOSPITAL_COMMUNITY)
Admission: RE | Admit: 2019-10-24 | Discharge: 2019-10-24 | Disposition: A | Discharge status: Home / Self Care | Payer: Medicare Other | Source: Ambulatory Visit | Attending: Gynecologic Oncology | Admitting: Gynecologic Oncology

## 2019-10-24 ENCOUNTER — Other Ambulatory Visit: Payer: Self-pay

## 2019-10-24 ENCOUNTER — Telehealth: Payer: Self-pay | Admitting: Cardiology

## 2019-10-24 DIAGNOSIS — K660 Peritoneal adhesions (postprocedural) (postinfection): Secondary | ICD-10-CM | POA: Diagnosis not present

## 2019-10-24 DIAGNOSIS — E039 Hypothyroidism, unspecified: Secondary | ICD-10-CM | POA: Diagnosis not present

## 2019-10-24 DIAGNOSIS — Z885 Allergy status to narcotic agent status: Secondary | ICD-10-CM | POA: Diagnosis not present

## 2019-10-24 DIAGNOSIS — J45909 Unspecified asthma, uncomplicated: Secondary | ICD-10-CM | POA: Diagnosis not present

## 2019-10-24 DIAGNOSIS — N200 Calculus of kidney: Secondary | ICD-10-CM | POA: Diagnosis not present

## 2019-10-24 DIAGNOSIS — K219 Gastro-esophageal reflux disease without esophagitis: Secondary | ICD-10-CM | POA: Diagnosis not present

## 2019-10-24 DIAGNOSIS — Z981 Arthrodesis status: Secondary | ICD-10-CM | POA: Diagnosis not present

## 2019-10-24 DIAGNOSIS — R1900 Intra-abdominal and pelvic swelling, mass and lump, unspecified site: Secondary | ICD-10-CM | POA: Diagnosis present

## 2019-10-24 DIAGNOSIS — Z8249 Family history of ischemic heart disease and other diseases of the circulatory system: Secondary | ICD-10-CM | POA: Diagnosis not present

## 2019-10-24 DIAGNOSIS — I10 Essential (primary) hypertension: Secondary | ICD-10-CM | POA: Diagnosis not present

## 2019-10-24 DIAGNOSIS — F319 Bipolar disorder, unspecified: Secondary | ICD-10-CM | POA: Diagnosis not present

## 2019-10-24 DIAGNOSIS — Z01818 Encounter for other preprocedural examination: Secondary | ICD-10-CM | POA: Diagnosis not present

## 2019-10-24 DIAGNOSIS — Z888 Allergy status to other drugs, medicaments and biological substances status: Secondary | ICD-10-CM | POA: Diagnosis not present

## 2019-10-24 DIAGNOSIS — J4530 Mild persistent asthma, uncomplicated: Secondary | ICD-10-CM | POA: Diagnosis not present

## 2019-10-24 DIAGNOSIS — K589 Irritable bowel syndrome without diarrhea: Secondary | ICD-10-CM | POA: Diagnosis not present

## 2019-10-24 DIAGNOSIS — Z79899 Other long term (current) drug therapy: Secondary | ICD-10-CM | POA: Diagnosis not present

## 2019-10-24 DIAGNOSIS — Z01812 Encounter for preprocedural laboratory examination: Secondary | ICD-10-CM | POA: Diagnosis not present

## 2019-10-24 DIAGNOSIS — Z20822 Contact with and (suspected) exposure to covid-19: Secondary | ICD-10-CM | POA: Diagnosis not present

## 2019-10-24 DIAGNOSIS — Z9049 Acquired absence of other specified parts of digestive tract: Secondary | ICD-10-CM | POA: Diagnosis not present

## 2019-10-24 DIAGNOSIS — F411 Generalized anxiety disorder: Secondary | ICD-10-CM | POA: Diagnosis not present

## 2019-10-24 DIAGNOSIS — Z7982 Long term (current) use of aspirin: Secondary | ICD-10-CM | POA: Diagnosis not present

## 2019-10-24 DIAGNOSIS — M797 Fibromyalgia: Secondary | ICD-10-CM | POA: Diagnosis not present

## 2019-10-24 DIAGNOSIS — Z79890 Hormone replacement therapy (postmenopausal): Secondary | ICD-10-CM | POA: Diagnosis not present

## 2019-10-24 DIAGNOSIS — D509 Iron deficiency anemia, unspecified: Secondary | ICD-10-CM | POA: Diagnosis not present

## 2019-10-24 DIAGNOSIS — C52 Malignant neoplasm of vagina: Secondary | ICD-10-CM | POA: Diagnosis not present

## 2019-10-24 DIAGNOSIS — Z9221 Personal history of antineoplastic chemotherapy: Secondary | ICD-10-CM | POA: Diagnosis not present

## 2019-10-24 DIAGNOSIS — E785 Hyperlipidemia, unspecified: Secondary | ICD-10-CM | POA: Diagnosis not present

## 2019-10-24 LAB — CBC
HCT: 41.7 % (ref 36.0–46.0)
Hemoglobin: 13.2 g/dL (ref 12.0–15.0)
MCH: 30.4 pg (ref 26.0–34.0)
MCHC: 31.7 g/dL (ref 30.0–36.0)
MCV: 96.1 fL (ref 80.0–100.0)
Platelets: 366 10*3/uL (ref 150–400)
RBC: 4.34 MIL/uL (ref 3.87–5.11)
RDW: 14.4 % (ref 11.5–15.5)
WBC: 6.3 10*3/uL (ref 4.0–10.5)
nRBC: 0 % (ref 0.0–0.2)

## 2019-10-24 LAB — URINALYSIS, ROUTINE W REFLEX MICROSCOPIC
Bilirubin Urine: NEGATIVE
Glucose, UA: NEGATIVE mg/dL
Hgb urine dipstick: NEGATIVE
Ketones, ur: NEGATIVE mg/dL
Nitrite: POSITIVE — AB
Protein, ur: NEGATIVE mg/dL
Specific Gravity, Urine: 1.009 (ref 1.005–1.030)
pH: 6 (ref 5.0–8.0)

## 2019-10-24 LAB — COMPREHENSIVE METABOLIC PANEL
ALT: 20 U/L (ref 0–44)
AST: 25 U/L (ref 15–41)
Albumin: 3.8 g/dL (ref 3.5–5.0)
Alkaline Phosphatase: 126 U/L (ref 38–126)
Anion gap: 8 (ref 5–15)
BUN: 17 mg/dL (ref 8–23)
CO2: 27 mmol/L (ref 22–32)
Calcium: 9.8 mg/dL (ref 8.9–10.3)
Chloride: 103 mmol/L (ref 98–111)
Creatinine, Ser: 1.27 mg/dL — ABNORMAL HIGH (ref 0.44–1.00)
GFR calc Af Amer: 50 mL/min — ABNORMAL LOW (ref >60)
GFR calc non Af Amer: 43 mL/min — ABNORMAL LOW (ref >60)
Glucose, Bld: 105 mg/dL — ABNORMAL HIGH (ref 70–99)
Potassium: 4.2 mmol/L (ref 3.5–5.1)
Sodium: 138 mmol/L (ref 135–145)
Total Bilirubin: 0.6 mg/dL (ref 0.3–1.2)
Total Protein: 7.4 g/dL (ref 6.5–8.1)

## 2019-10-24 LAB — ABO/RH: ABO/RH(D): O POS

## 2019-10-24 MED ORDER — HYDROCHLOROTHIAZIDE 12.5 MG PO CAPS
12.5000 mg | ORAL_CAPSULE | Freq: Every day | ORAL | 3 refills | Status: DC
Start: 2019-10-24 — End: 2020-03-17

## 2019-10-24 NOTE — Telephone Encounter (Signed)
Refill sent in per patient request. I verbalized with Dr. Bettina Gavia that this was ok and he said that it was fine to send it in.

## 2019-10-24 NOTE — Consult Note (Addendum)
[place] Nurse requested for preoperative stoma site marking  Discussed surgical procedure and stoma creation with patient.  Explained role of the [place] nurse team.  Provided the patient with educational booklet and provided samples of pouching options.  Answered patient's questions.   Examined patient sitting and standing in order to place the marking in the patient's visual field, away from any creases or abdominal contour issues and within the rectus muscle.  Attempted to mark below the patient's belt line, but this was not possible since a significant crease appears lower on the abdomenwhen the patient leans forward and should be avoided if possible.   Marked for colostomy in the LLQ  __6__ cm to the left of the umbilicus and __0__RV above the umbilicus.  Marked for ileostomy in the RLQ  __6__cm to the right of the umbilicus and  __6__ cm above the umbilicus.  Patient's abdomen cleansed with CHG wipes at site markings, allowed to air dry prior to marking.Covered mark with thin film transparent dressing to preserve mark until date of surgery. Gave her the marking pen and told her to color the mark back in if it begins to fade prior to surgery.  [place] Nurse team will follow up with patient after surgery for continued ostomy care and teaching if she receives an ostomy during surgery.  Thank-you,  Julien Girt MSN, [place], [place], [place], [place]

## 2019-10-24 NOTE — Telephone Encounter (Signed)
Pt c/o BP issue: STAT if pt c/o blurred vision, one-sided weakness or slurred speech  1. What are your last 5 BP readings? 150/80, 156/102 this morning  2. Are you having any other symptoms (ex. Dizziness, headache, blurred vision, passed out)? Headaches  3. What is your BP issue?  Blood pressure running high- went for pre op visit and they her to start back on her HCTZ    *STAT* If patient is at the pharmacy, call can be transferred to refill team.   1. Which medications need to be refilled? (please list name of each medication and dose if known)  New prescription for HCTZ  2. Which pharmacy/location (including street and city if local pharmacy) is medication to be sent to?[facility] [address] [place] 3. Do they need a 30 day or 90 day supply?90 days

## 2019-10-26 ENCOUNTER — Other Ambulatory Visit (HOSPITAL_COMMUNITY)
Admission: RE | Admit: 2019-10-26 | Discharge: 2019-10-26 | Disposition: A | Discharge status: Home / Self Care | Payer: Medicare Other | Source: Ambulatory Visit | Attending: Gynecologic Oncology | Admitting: Gynecologic Oncology

## 2019-10-26 ENCOUNTER — Telehealth: Payer: Self-pay

## 2019-10-26 ENCOUNTER — Other Ambulatory Visit: Payer: Self-pay | Admitting: Gynecologic Oncology

## 2019-10-26 DIAGNOSIS — Z20822 Contact with and (suspected) exposure to covid-19: Secondary | ICD-10-CM | POA: Insufficient documentation

## 2019-10-26 DIAGNOSIS — Z01812 Encounter for preprocedural laboratory examination: Secondary | ICD-10-CM | POA: Insufficient documentation

## 2019-10-26 LAB — SARS CORONAVIRUS 2 (TAT 6-24 HRS): SARS Coronavirus 2: NEGATIVE

## 2019-10-26 MED ORDER — NITROFURANTOIN MONOHYD MACRO 100 MG PO CAPS
100.0000 mg | ORAL_CAPSULE | Freq: Two times a day (BID) | ORAL | 0 refills | Status: AC
Start: 2019-10-26 — End: 2019-10-31

## 2019-10-26 NOTE — Telephone Encounter (Signed)
Left vm on patient's personal vm. Urine culture shows susceptibilities.  MD prescribed Macrobid 100 mg BID x 5 days.  Nurse will touch base with patient on Monday.

## 2019-10-26 NOTE — Progress Notes (Signed)
Urine analysis results: shows bacteria in urine. Urine culture: shows colonies of enterococcus faecalis.

## 2019-10-27 LAB — URINE CULTURE: Culture: >=100000 — AB

## 2019-10-29 ENCOUNTER — Telehealth: Payer: Self-pay

## 2019-10-29 NOTE — Anesthesia Preprocedure Evaluation (Addendum)
Anesthesia Evaluation  Patient identified by MRN, date of birth, ID band Patient awake    Reviewed: NPO status , Patient's Chart, lab work & pertinent test results  Airway Mallampati: II  TM Distance: >3 FB     Dental   Pulmonary asthma , pneumonia,    breath sounds clear to auscultation       Cardiovascular hypertension,  Rhythm:Regular Rate:Normal     Neuro/Psych  Headaches, Anxiety Depression Bipolar Disorder Dementia  Neuromuscular disease    GI/Hepatic Neg liver ROS, GERD  ,  Endo/Other  Hypothyroidism   Renal/GU Renal disease     Musculoskeletal   Abdominal   Peds  Hematology  (+) anemia ,   Anesthesia Other Findings   Reproductive/Obstetrics                            Anesthesia Physical Anesthesia Plan  ASA: III  Anesthesia Plan: General   Post-op Pain Management:    Induction: Intravenous  PONV Risk Score and Plan: 3 and Ondansetron, Dexamethasone and Midazolam  Airway Management Planned: Oral ETT  Additional Equipment:   Intra-op Plan:   Post-operative Plan: Possible Post-op intubation/ventilation  Informed Consent:     Dental advisory given  Plan Discussed with: CRNA and Anesthesiologist  Anesthesia Plan Comments:        Anesthesia Quick Evaluation

## 2019-10-29 NOTE — Telephone Encounter (Signed)
Called patient to see if she had picked up nitrofurantoin as prescribed by MD.  Patient did and started on Friday.  No c/o symptoms for UTI.  Patient knows to call with concerns.

## 2019-10-30 ENCOUNTER — Encounter (HOSPITAL_COMMUNITY): Payer: Self-pay | Admitting: Gynecologic Oncology

## 2019-10-30 ENCOUNTER — Other Ambulatory Visit: Payer: Self-pay

## 2019-10-30 ENCOUNTER — Encounter (HOSPITAL_COMMUNITY)
Admission: RE | Disposition: A | Discharge status: Home / Self Care | Payer: Self-pay | Source: Home / Self Care | Attending: Gynecologic Oncology

## 2019-10-30 ENCOUNTER — Inpatient Hospital Stay (HOSPITAL_COMMUNITY): Payer: Medicare Other | Admitting: Physician Assistant

## 2019-10-30 ENCOUNTER — Inpatient Hospital Stay (HOSPITAL_COMMUNITY): Payer: Medicare Other | Admitting: Certified Registered Nurse Anesthetist

## 2019-10-30 ENCOUNTER — Inpatient Hospital Stay (HOSPITAL_COMMUNITY)
Admission: RE | Admit: 2019-10-30 | Discharge: 2019-10-31 | DRG: 747 | Disposition: A | Discharge status: Home / Self Care | Payer: Medicare Other | Attending: Gynecologic Oncology | Admitting: Gynecologic Oncology

## 2019-10-30 DIAGNOSIS — F411 Generalized anxiety disorder: Secondary | ICD-10-CM | POA: Diagnosis present

## 2019-10-30 DIAGNOSIS — Z7982 Long term (current) use of aspirin: Secondary | ICD-10-CM

## 2019-10-30 DIAGNOSIS — Z9049 Acquired absence of other specified parts of digestive tract: Secondary | ICD-10-CM | POA: Diagnosis not present

## 2019-10-30 DIAGNOSIS — Z9221 Personal history of antineoplastic chemotherapy: Secondary | ICD-10-CM

## 2019-10-30 DIAGNOSIS — D631 Anemia in chronic kidney disease: Secondary | ICD-10-CM | POA: Diagnosis not present

## 2019-10-30 DIAGNOSIS — N183 Chronic kidney disease, stage 3 unspecified: Secondary | ICD-10-CM | POA: Diagnosis not present

## 2019-10-30 DIAGNOSIS — K589 Irritable bowel syndrome without diarrhea: Secondary | ICD-10-CM | POA: Diagnosis present

## 2019-10-30 DIAGNOSIS — Z79899 Other long term (current) drug therapy: Secondary | ICD-10-CM | POA: Diagnosis not present

## 2019-10-30 DIAGNOSIS — Z466 Encounter for fitting and adjustment of urinary device: Secondary | ICD-10-CM | POA: Diagnosis not present

## 2019-10-30 DIAGNOSIS — K660 Peritoneal adhesions (postprocedural) (postinfection): Secondary | ICD-10-CM | POA: Diagnosis not present

## 2019-10-30 DIAGNOSIS — Z20822 Contact with and (suspected) exposure to covid-19: Secondary | ICD-10-CM | POA: Diagnosis not present

## 2019-10-30 DIAGNOSIS — F319 Bipolar disorder, unspecified: Secondary | ICD-10-CM | POA: Diagnosis present

## 2019-10-30 DIAGNOSIS — Z885 Allergy status to narcotic agent status: Secondary | ICD-10-CM | POA: Diagnosis not present

## 2019-10-30 DIAGNOSIS — K219 Gastro-esophageal reflux disease without esophagitis: Secondary | ICD-10-CM | POA: Diagnosis not present

## 2019-10-30 DIAGNOSIS — I10 Essential (primary) hypertension: Secondary | ICD-10-CM | POA: Diagnosis not present

## 2019-10-30 DIAGNOSIS — Z981 Arthrodesis status: Secondary | ICD-10-CM | POA: Diagnosis not present

## 2019-10-30 DIAGNOSIS — Z79890 Hormone replacement therapy (postmenopausal): Secondary | ICD-10-CM

## 2019-10-30 DIAGNOSIS — C549 Malignant neoplasm of corpus uteri, unspecified: Secondary | ICD-10-CM

## 2019-10-30 DIAGNOSIS — C499 Malignant neoplasm of connective and soft tissue, unspecified: Secondary | ICD-10-CM

## 2019-10-30 DIAGNOSIS — C52 Malignant neoplasm of vagina: Secondary | ICD-10-CM | POA: Diagnosis present

## 2019-10-30 DIAGNOSIS — Z408 Encounter for other prophylactic surgery: Secondary | ICD-10-CM | POA: Diagnosis not present

## 2019-10-30 DIAGNOSIS — D509 Iron deficiency anemia, unspecified: Secondary | ICD-10-CM | POA: Diagnosis not present

## 2019-10-30 DIAGNOSIS — R1900 Intra-abdominal and pelvic swelling, mass and lump, unspecified site: Secondary | ICD-10-CM | POA: Diagnosis not present

## 2019-10-30 DIAGNOSIS — Z833 Family history of diabetes mellitus: Secondary | ICD-10-CM

## 2019-10-30 DIAGNOSIS — Z881 Allergy status to other antibiotic agents status: Secondary | ICD-10-CM

## 2019-10-30 DIAGNOSIS — Z8611 Personal history of tuberculosis: Secondary | ICD-10-CM

## 2019-10-30 DIAGNOSIS — Z888 Allergy status to other drugs, medicaments and biological substances status: Secondary | ICD-10-CM

## 2019-10-30 DIAGNOSIS — Z8249 Family history of ischemic heart disease and other diseases of the circulatory system: Secondary | ICD-10-CM

## 2019-10-30 DIAGNOSIS — E039 Hypothyroidism, unspecified: Secondary | ICD-10-CM | POA: Diagnosis present

## 2019-10-30 DIAGNOSIS — E785 Hyperlipidemia, unspecified: Secondary | ICD-10-CM | POA: Diagnosis not present

## 2019-10-30 DIAGNOSIS — M797 Fibromyalgia: Secondary | ICD-10-CM | POA: Diagnosis present

## 2019-10-30 DIAGNOSIS — Z8379 Family history of other diseases of the digestive system: Secondary | ICD-10-CM

## 2019-10-30 DIAGNOSIS — Z87820 Personal history of traumatic brain injury: Secondary | ICD-10-CM

## 2019-10-30 DIAGNOSIS — J45909 Unspecified asthma, uncomplicated: Secondary | ICD-10-CM | POA: Diagnosis not present

## 2019-10-30 DIAGNOSIS — I129 Hypertensive chronic kidney disease with stage 1 through stage 4 chronic kidney disease, or unspecified chronic kidney disease: Secondary | ICD-10-CM | POA: Diagnosis not present

## 2019-10-30 DIAGNOSIS — Z86718 Personal history of other venous thrombosis and embolism: Secondary | ICD-10-CM

## 2019-10-30 DIAGNOSIS — N200 Calculus of kidney: Secondary | ICD-10-CM | POA: Diagnosis present

## 2019-10-30 DIAGNOSIS — J4530 Mild persistent asthma, uncomplicated: Secondary | ICD-10-CM | POA: Diagnosis present

## 2019-10-30 DIAGNOSIS — C495 Malignant neoplasm of connective and soft tissue of pelvis: Secondary | ICD-10-CM | POA: Diagnosis not present

## 2019-10-30 HISTORY — PX: ROBOTIC ASSISTED BILATERAL SALPINGO OOPHERECTOMY: SHX6078

## 2019-10-30 HISTORY — PX: CYSTOSCOPY WITH STENT PLACEMENT: SHX5790

## 2019-10-30 HISTORY — DX: Malignant neoplasm of corpus uteri, unspecified: C54.9

## 2019-10-30 LAB — TYPE AND SCREEN
ABO/RH(D): O POS
Antibody Screen: NEGATIVE

## 2019-10-30 SURGERY — SALPINGO-OOPHORECTOMY, BILATERAL, ROBOT-ASSISTED
Anesthesia: General

## 2019-10-30 MED ORDER — MIDAZOLAM HCL 2 MG/2ML IJ SOLN
INTRAMUSCULAR | Status: AC
  Filled 2019-10-30: qty 2

## 2019-10-30 MED ORDER — PHENYLEPHRINE 40 MCG/ML (10ML) SYRINGE FOR IV PUSH (FOR BLOOD PRESSURE SUPPORT)
PREFILLED_SYRINGE | INTRAVENOUS | Status: DC | PRN
  Administered 2019-10-30: 80 ug via INTRAVENOUS

## 2019-10-30 MED ORDER — MIDAZOLAM HCL 5 MG/5ML IJ SOLN
INTRAMUSCULAR | Status: DC | PRN
  Administered 2019-10-30: 2 mg via INTRAVENOUS

## 2019-10-30 MED ORDER — LEVOTHYROXINE SODIUM 100 MCG PO TABS
100.0000 ug | ORAL_TABLET | Freq: Every day | ORAL | Status: DC
  Administered 2019-10-31: 100 ug via ORAL
  Filled 2019-10-30: qty 1

## 2019-10-30 MED ORDER — ONDANSETRON HCL 4 MG PO TABS: 4.0000 mg | ORAL_TABLET | Freq: Four times a day (QID) | ORAL | Status: DC | PRN

## 2019-10-30 MED ORDER — PROPOFOL 10 MG/ML IV BOLUS
INTRAVENOUS | Status: DC | PRN
  Administered 2019-10-30: 150 mg via INTRAVENOUS

## 2019-10-30 MED ORDER — DEXAMETHASONE SODIUM PHOSPHATE 4 MG/ML IJ SOLN: 4.0000 mg | INTRAMUSCULAR | Status: DC

## 2019-10-30 MED ORDER — LACTATED RINGERS IV SOLN: INTRAVENOUS | Status: DC

## 2019-10-30 MED ORDER — BUSPIRONE HCL 5 MG PO TABS
15.0000 mg | ORAL_TABLET | Freq: Two times a day (BID) | ORAL | Status: DC
  Administered 2019-10-30 – 2019-10-31 (×2): 15 mg via ORAL
  Filled 2019-10-30 (×2): qty 3

## 2019-10-30 MED ORDER — FENTANYL CITRATE (PF) 250 MCG/5ML IJ SOLN
INTRAMUSCULAR | Status: AC
  Filled 2019-10-30: qty 5

## 2019-10-30 MED ORDER — EPHEDRINE SULFATE-NACL 50-0.9 MG/10ML-% IV SOSY
PREFILLED_SYRINGE | INTRAVENOUS | Status: DC | PRN
  Administered 2019-10-30: 5 mg via INTRAVENOUS
  Administered 2019-10-30 (×2): 10 mg via INTRAVENOUS
  Administered 2019-10-30: 15 mg via INTRAVENOUS

## 2019-10-30 MED ORDER — BUPIVACAINE LIPOSOME 1.3 % IJ SUSP
20.0000 mL | Freq: Once | INTRAMUSCULAR | Status: DC
  Filled 2019-10-30: qty 20

## 2019-10-30 MED ORDER — TRAMADOL HCL 50 MG PO TABS
ORAL_TABLET | ORAL | Status: AC
  Filled 2019-10-30: qty 2

## 2019-10-30 MED ORDER — ENOXAPARIN (LOVENOX) PATIENT EDUCATION KIT
PACK | Freq: Once | Status: AC
  Filled 2019-10-30 (×2): qty 1

## 2019-10-30 MED ORDER — PANTOPRAZOLE SODIUM 40 MG PO TBEC
40.0000 mg | DELAYED_RELEASE_TABLET | Freq: Every day | ORAL | Status: DC
  Administered 2019-10-31: 40 mg via ORAL
  Filled 2019-10-30: qty 1

## 2019-10-30 MED ORDER — STERILE WATER FOR IRRIGATION IR SOLN
Status: DC | PRN
  Administered 2019-10-30: 1000 mL

## 2019-10-30 MED ORDER — LACTATED RINGERS IR SOLN
Status: DC | PRN
  Administered 2019-10-30: 1

## 2019-10-30 MED ORDER — SUGAMMADEX SODIUM 200 MG/2ML IV SOLN
INTRAVENOUS | Status: DC | PRN
  Administered 2019-10-30: 300 mg via INTRAVENOUS

## 2019-10-30 MED ORDER — FENTANYL CITRATE (PF) 250 MCG/5ML IJ SOLN
INTRAMUSCULAR | Status: DC | PRN
  Administered 2019-10-30 (×7): 50 ug via INTRAVENOUS

## 2019-10-30 MED ORDER — ALPRAZOLAM 0.5 MG PO TABS: 0.5000 mg | ORAL_TABLET | Freq: Two times a day (BID) | ORAL | Status: DC | PRN

## 2019-10-30 MED ORDER — ACETAMINOPHEN 500 MG PO TABS
1000.0000 mg | ORAL_TABLET | ORAL | Status: AC
  Administered 2019-10-30: 1000 mg via ORAL

## 2019-10-30 MED ORDER — IBUPROFEN 400 MG PO TABS
600.0000 mg | ORAL_TABLET | Freq: Four times a day (QID) | ORAL | Status: DC
  Administered 2019-10-31: 600 mg via ORAL
  Filled 2019-10-30: qty 1

## 2019-10-30 MED ORDER — ONDANSETRON HCL 4 MG/2ML IJ SOLN
INTRAMUSCULAR | Status: DC | PRN
  Administered 2019-10-30: 4 mg via INTRAVENOUS

## 2019-10-30 MED ORDER — PHENYLEPHRINE HCL (PRESSORS) 10 MG/ML IV SOLN
INTRAVENOUS | Status: AC
  Filled 2019-10-30: qty 1

## 2019-10-30 MED ORDER — FAMOTIDINE 20 MG PO TABS
40.0000 mg | ORAL_TABLET | Freq: Every day | ORAL | Status: DC
  Administered 2019-10-30: 40 mg via ORAL
  Filled 2019-10-30: qty 2

## 2019-10-30 MED ORDER — SUCCINYLCHOLINE CHLORIDE 200 MG/10ML IV SOSY
PREFILLED_SYRINGE | INTRAVENOUS | Status: DC | PRN
  Administered 2019-10-30: 120 mg via INTRAVENOUS

## 2019-10-30 MED ORDER — HYDROMORPHONE HCL 2 MG PO TABS
2.0000 mg | ORAL_TABLET | ORAL | Status: DC | PRN
  Administered 2019-10-30: 2 mg via ORAL
  Filled 2019-10-30: qty 1

## 2019-10-30 MED ORDER — ONDANSETRON HCL 4 MG/2ML IJ SOLN
4.0000 mg | Freq: Four times a day (QID) | INTRAMUSCULAR | Status: DC | PRN
  Administered 2019-10-30: 4 mg via INTRAVENOUS
  Filled 2019-10-30: qty 2

## 2019-10-30 MED ORDER — ENOXAPARIN SODIUM 40 MG/0.4ML ~~LOC~~ SOLN
40.0000 mg | SUBCUTANEOUS | Status: DC
  Administered 2019-10-31: 40 mg via SUBCUTANEOUS
  Filled 2019-10-30: qty 0.4

## 2019-10-30 MED ORDER — GABAPENTIN 100 MG PO CAPS
100.0000 mg | ORAL_CAPSULE | ORAL | Status: AC
  Administered 2019-10-30: 100 mg via ORAL
  Filled 2019-10-30: qty 1

## 2019-10-30 MED ORDER — LACTATED RINGERS IV SOLN
INTRAVENOUS | Status: DC | PRN
Start: 2019-10-30 — End: 2019-10-30

## 2019-10-30 MED ORDER — LIDOCAINE 2% (20 MG/ML) 5 ML SYRINGE
INTRAMUSCULAR | Status: DC | PRN
  Administered 2019-10-30: 60 mg via INTRAVENOUS

## 2019-10-30 MED ORDER — SUMATRIPTAN SUCCINATE 50 MG PO TABS
50.0000 mg | ORAL_TABLET | ORAL | Status: DC | PRN
  Administered 2019-10-30: 100 mg via ORAL
  Filled 2019-10-30 (×2): qty 2

## 2019-10-30 MED ORDER — LIDOCAINE 20MG/ML (2%) 15 ML SYRINGE OPTIME
INTRAMUSCULAR | Status: DC | PRN
Start: 2019-10-30 — End: 2019-10-30
  Administered 2019-10-30: 1.5 mg/kg/h via INTRAVENOUS

## 2019-10-30 MED ORDER — HYDROMORPHONE HCL 1 MG/ML IJ SOLN: 0.5000 mg | INTRAMUSCULAR | Status: DC | PRN

## 2019-10-30 MED ORDER — CHLORHEXIDINE GLUCONATE 0.12 % MT SOLN
15.0000 mL | Freq: Once | OROMUCOSAL | Status: AC
  Administered 2019-10-30: 15 mL via OROMUCOSAL

## 2019-10-30 MED ORDER — ORAL CARE MOUTH RINSE: 15.0000 mL | Freq: Once | OROMUCOSAL | Status: AC

## 2019-10-30 MED ORDER — SODIUM CHLORIDE 0.9 % IR SOLN
Status: DC | PRN
  Administered 2019-10-30: 1000 mL

## 2019-10-30 MED ORDER — ASPIRIN EC 81 MG PO TBEC
81.0000 mg | DELAYED_RELEASE_TABLET | Freq: Every day | ORAL | Status: DC
  Administered 2019-10-31: 81 mg via ORAL
  Filled 2019-10-30: qty 1

## 2019-10-30 MED ORDER — ALBUTEROL SULFATE (2.5 MG/3ML) 0.083% IN NEBU: 3.0000 mL | INHALATION_SOLUTION | Freq: Four times a day (QID) | RESPIRATORY_TRACT | Status: DC | PRN

## 2019-10-30 MED ORDER — CEFAZOLIN SODIUM-DEXTROSE 2-4 GM/100ML-% IV SOLN
2.0000 g | Freq: Once | INTRAVENOUS | Status: AC
  Administered 2019-10-30: 2 g via INTRAVENOUS

## 2019-10-30 MED ORDER — SENNOSIDES-DOCUSATE SODIUM 8.6-50 MG PO TABS
2.0000 | ORAL_TABLET | Freq: Every day | ORAL | Status: DC
  Administered 2019-10-30: 2 via ORAL
  Filled 2019-10-30: qty 2

## 2019-10-30 MED ORDER — BUPIVACAINE HCL 0.25 % IJ SOLN
INTRAMUSCULAR | Status: DC | PRN
  Administered 2019-10-30: 30 mL

## 2019-10-30 MED ORDER — HEPARIN SODIUM (PORCINE) 5000 UNIT/ML IJ SOLN
5000.0000 [IU] | INTRAMUSCULAR | Status: AC
  Administered 2019-10-30: 5000 [IU] via SUBCUTANEOUS
  Filled 2019-10-30: qty 1

## 2019-10-30 MED ORDER — DEXAMETHASONE SODIUM PHOSPHATE 10 MG/ML IJ SOLN
INTRAMUSCULAR | Status: AC
  Filled 2019-10-30: qty 1

## 2019-10-30 MED ORDER — BUPIVACAINE HCL 0.25 % IJ SOLN
INTRAMUSCULAR | Status: AC
  Filled 2019-10-30: qty 1

## 2019-10-30 MED ORDER — FLUOXETINE HCL 20 MG PO CAPS
40.0000 mg | ORAL_CAPSULE | Freq: Every morning | ORAL | Status: DC
  Administered 2019-10-31: 40 mg via ORAL
  Filled 2019-10-30: qty 2

## 2019-10-30 MED ORDER — ACETAMINOPHEN 500 MG PO TABS
1000.0000 mg | ORAL_TABLET | Freq: Two times a day (BID) | ORAL | Status: DC
  Administered 2019-10-30 – 2019-10-31 (×2): 1000 mg via ORAL
  Filled 2019-10-30 (×2): qty 2

## 2019-10-30 MED ORDER — TRAMADOL HCL 50 MG PO TABS
100.0000 mg | ORAL_TABLET | Freq: Two times a day (BID) | ORAL | Status: DC | PRN
  Administered 2019-10-30: 100 mg via ORAL
  Filled 2019-10-30: qty 2

## 2019-10-30 MED ORDER — ONDANSETRON HCL 4 MG/2ML IJ SOLN
INTRAMUSCULAR | Status: AC
  Filled 2019-10-30: qty 2

## 2019-10-30 MED ORDER — SCOPOLAMINE 1 MG/3DAYS TD PT72
1.0000 | MEDICATED_PATCH | TRANSDERMAL | Status: DC
  Administered 2019-10-30: 1.5 mg via TRANSDERMAL
  Filled 2019-10-30: qty 1

## 2019-10-30 MED ORDER — CEFAZOLIN SODIUM-DEXTROSE 2-4 GM/100ML-% IV SOLN
INTRAVENOUS | Status: AC
  Filled 2019-10-30: qty 100

## 2019-10-30 MED ORDER — LIDOCAINE 2% (20 MG/ML) 5 ML SYRINGE
INTRAMUSCULAR | Status: AC
  Filled 2019-10-30: qty 5

## 2019-10-30 MED ORDER — FENTANYL CITRATE (PF) 100 MCG/2ML IJ SOLN: 25.0000 ug | INTRAMUSCULAR | Status: DC | PRN

## 2019-10-30 MED ORDER — PROPOFOL 10 MG/ML IV BOLUS
INTRAVENOUS | Status: AC
  Filled 2019-10-30: qty 20

## 2019-10-30 MED ORDER — ROCURONIUM BROMIDE 10 MG/ML (PF) SYRINGE
PREFILLED_SYRINGE | INTRAVENOUS | Status: DC | PRN
  Administered 2019-10-30: 20 mg via INTRAVENOUS
  Administered 2019-10-30: 50 mg via INTRAVENOUS
  Administered 2019-10-30: 30 mg via INTRAVENOUS
  Administered 2019-10-30: 20 mg via INTRAVENOUS

## 2019-10-30 MED ORDER — KCL IN DEXTROSE-NACL 20-5-0.45 MEQ/L-%-% IV SOLN
INTRAVENOUS | Status: DC
  Filled 2019-10-30 (×2): qty 1000

## 2019-10-30 MED ORDER — DEXAMETHASONE SODIUM PHOSPHATE 10 MG/ML IJ SOLN
INTRAMUSCULAR | Status: DC | PRN
  Administered 2019-10-30: 5 mg via INTRAVENOUS

## 2019-10-30 MED ORDER — ALBUTEROL SULFATE (2.5 MG/3ML) 0.083% IN NEBU: 2.5000 mg | INHALATION_SOLUTION | Freq: Four times a day (QID) | RESPIRATORY_TRACT | Status: DC | PRN

## 2019-10-30 MED ORDER — FENTANYL CITRATE (PF) 100 MCG/2ML IJ SOLN: INTRAMUSCULAR | Status: DC | PRN

## 2019-10-30 MED ORDER — ROCURONIUM BROMIDE 10 MG/ML (PF) SYRINGE
PREFILLED_SYRINGE | INTRAVENOUS | Status: AC
  Filled 2019-10-30: qty 10

## 2019-10-30 MED ORDER — MIRTAZAPINE 15 MG PO TABS
30.0000 mg | ORAL_TABLET | Freq: Every day | ORAL | Status: DC
  Administered 2019-10-30: 30 mg via ORAL
  Filled 2019-10-30: qty 2

## 2019-10-30 SURGICAL SUPPLY — 141 items
ADH SKN CLS APL DERMABOND .7 (GAUZE/BANDAGES/DRESSINGS) ×1
AGENT HMST KT MTR STRL THRMB (HEMOSTASIS)
APL ESCP 34 STRL LF DISP (HEMOSTASIS)
APL PRP STRL LF DISP 70% ISPRP (MISCELLANEOUS) ×1
APL SRG 38 LTWT LNG FL B (MISCELLANEOUS) ×1
APPLICATOR ARISTA FLEXITIP XL (MISCELLANEOUS) ×1 IMPLANT
APPLICATOR SURGIFLO ENDO (HEMOSTASIS) IMPLANT
BACTOSHIELD CHG 4% 4OZ (MISCELLANEOUS) ×1
BAG LAPAROSCOPIC 12 15 PORT 16 (BASKET) IMPLANT
BAG RETRIEVAL 12/15 (BASKET)
BAG SPEC RTRVL LRG 6X4 10 (ENDOMECHANICALS) ×1
BAG URO CATCHER STRL LF (MISCELLANEOUS) ×2 IMPLANT
BLADE EXTENDED COATED 6.5IN (ELECTRODE) IMPLANT
BLADE HEX COATED 2.75 (ELECTRODE) ×2 IMPLANT
BLADE SURG SZ10 CARB STEEL (BLADE) IMPLANT
CATH INTERMIT  6FR 70CM (CATHETERS) ×3 IMPLANT
CELLS DAT CNTRL 66122 CELL SVR (MISCELLANEOUS) IMPLANT
CHLORAPREP W/TINT 26 (MISCELLANEOUS) ×2 IMPLANT
CLOTH BEACON ORANGE TIMEOUT ST (SAFETY) ×2 IMPLANT
COUNTER NEEDLE 20 DBL MAG RED (NEEDLE) ×2 IMPLANT
COVER BACK TABLE 60X90IN (DRAPES) ×2 IMPLANT
COVER MAYO STAND STRL (DRAPES) ×6 IMPLANT
COVER TIP SHEARS 8 DVNC (MISCELLANEOUS) ×1 IMPLANT
COVER TIP SHEARS 8MM DA VINCI (MISCELLANEOUS) ×2
COVER WAND RF STERILE (DRAPES) IMPLANT
DECANTER SPIKE VIAL GLASS SM (MISCELLANEOUS) ×2 IMPLANT
DERMABOND ADVANCED (GAUZE/BANDAGES/DRESSINGS) ×1
DERMABOND ADVANCED .7 DNX12 (GAUZE/BANDAGES/DRESSINGS) ×2 IMPLANT
DRAIN CHANNEL 19F RND (DRAIN) IMPLANT
DRAPE ARM DVNC X/XI (DISPOSABLE) ×4 IMPLANT
DRAPE COLUMN DVNC XI (DISPOSABLE) ×1 IMPLANT
DRAPE DA VINCI XI ARM (DISPOSABLE) ×8
DRAPE DA VINCI XI COLUMN (DISPOSABLE) ×2
DRAPE LAPAROSCOPIC ABDOMINAL (DRAPES) ×2 IMPLANT
DRAPE SHEET LG 3/4 BI-LAMINATE (DRAPES) ×3 IMPLANT
DRAPE SURG IRRIG POUCH 19X23 (DRAPES) ×2 IMPLANT
DRSG OPSITE POSTOP 4X10 (GAUZE/BANDAGES/DRESSINGS) IMPLANT
DRSG OPSITE POSTOP 4X6 (GAUZE/BANDAGES/DRESSINGS) IMPLANT
DRSG OPSITE POSTOP 4X8 (GAUZE/BANDAGES/DRESSINGS) IMPLANT
DRSG PAD ABDOMINAL 8X10 ST (GAUZE/BANDAGES/DRESSINGS) IMPLANT
ELECT REM PT RETURN 15FT ADLT (MISCELLANEOUS) ×4 IMPLANT
EVACUATOR SILICONE 100CC (DRAIN) IMPLANT
GAUZE 4X4 16PLY RFD (DISPOSABLE) ×2 IMPLANT
GAUZE SPONGE 4X4 12PLY STRL (GAUZE/BANDAGES/DRESSINGS) IMPLANT
GLOVE BIO SURGEON STRL SZ 6 (GLOVE) ×8 IMPLANT
GLOVE BIO SURGEON STRL SZ 6.5 (GLOVE) ×8 IMPLANT
GLOVE BIO SURGEON STRL SZ7.5 (GLOVE) ×2 IMPLANT
GLOVE BIOGEL PI IND STRL 7.0 (GLOVE) ×2 IMPLANT
GLOVE BIOGEL PI INDICATOR 7.0 (GLOVE) ×2
GOWN SRG 2XL LVL 4 BRTHBL STRL (GOWNS) ×1 IMPLANT
GOWN STRL NON-REIN 2XL LVL4 (GOWNS) ×2
GOWN STRL REUS W/ TWL LRG LVL3 (GOWN DISPOSABLE) ×4 IMPLANT
GOWN STRL REUS W/TWL LRG LVL3 (GOWN DISPOSABLE) ×12 IMPLANT
GOWN STRL REUS W/TWL XL LVL3 (GOWN DISPOSABLE) ×14 IMPLANT
GUIDEWIRE STR DUAL SENSOR (WIRE) ×2 IMPLANT
HEMOSTAT ARISTA ABSORB 3G PWDR (HEMOSTASIS) ×1 IMPLANT
HOLDER FOLEY CATH W/STRAP (MISCELLANEOUS) ×2 IMPLANT
IRRIG SUCT STRYKERFLOW 2 WTIP (MISCELLANEOUS) ×2
IRRIGATION SUCT STRKRFLW 2 WTP (MISCELLANEOUS) ×1 IMPLANT
KIT BASIN (CUSTOM PROCEDURE TRAY) ×2 IMPLANT
KIT PROCEDURE DA VINCI SI (MISCELLANEOUS)
KIT PROCEDURE DVNC SI (MISCELLANEOUS) IMPLANT
KIT TURNOVER KIT A (KITS) IMPLANT
LEGGING LITHOTOMY PAIR STRL (DRAPES) IMPLANT
MANIFOLD NEPTUNE II (INSTRUMENTS) ×2 IMPLANT
MANIPULATOR UTERINE 4.5 ZUMI (MISCELLANEOUS) ×2 IMPLANT
NDL HYPO 21X1.5 SAFETY (NEEDLE) ×1 IMPLANT
NDL HYPO 25X1 1.5 SAFETY (NEEDLE) ×1 IMPLANT
NDL SPNL 18GX3.5 QUINCKE PK (NEEDLE) IMPLANT
NEEDLE HYPO 21X1.5 SAFETY (NEEDLE) ×2 IMPLANT
NEEDLE HYPO 25X1 1.5 SAFETY (NEEDLE) ×2 IMPLANT
NEEDLE SPNL 18GX3.5 QUINCKE PK (NEEDLE) IMPLANT
NS IRRIG 1000ML POUR BTL (IV SOLUTION) ×2 IMPLANT
OBTURATOR OPTICAL STANDARD 8MM (TROCAR) ×2
OBTURATOR OPTICAL STND 8 DVNC (TROCAR) ×1
OBTURATOR OPTICALSTD 8 DVNC (TROCAR) ×1 IMPLANT
OCCLUDER COLPOPNEUMO (BALLOONS) ×1 IMPLANT
PACK LITHOTOMY IV (CUSTOM PROCEDURE TRAY) ×2 IMPLANT
PACK ROBOT GYN WLCUSTOM (TRAY / TRAY PROCEDURE) ×2 IMPLANT
PAD POSITIONING PINK XL (MISCELLANEOUS) ×4 IMPLANT
PENCIL SMOKE EVACUATOR (MISCELLANEOUS) IMPLANT
PORT ACCESS TROCAR AIRSEAL 12 (TROCAR) ×1 IMPLANT
PORT ACCESS TROCAR AIRSEAL 5M (TROCAR) ×1
POUCH SPECIMEN RETRIEVAL 10MM (ENDOMECHANICALS) ×1 IMPLANT
PROTECTOR NERVE ULNAR (MISCELLANEOUS) ×2 IMPLANT
RETRACTOR LONE STAR DISPOSABLE (INSTRUMENTS) ×2 IMPLANT
RETRACTOR STAY HOOK 5MM (MISCELLANEOUS) IMPLANT
RETRACTOR WND ALEXIS 18 MED (MISCELLANEOUS) IMPLANT
RTRCTR WOUND ALEXIS 18CM MED (MISCELLANEOUS)
SCRUB CHG 4% DYNA-HEX 4OZ (MISCELLANEOUS) ×1 IMPLANT
SEAL CANN UNIV 5-8 DVNC XI (MISCELLANEOUS) ×3 IMPLANT
SEAL XI 5MM-8MM UNIVERSAL (MISCELLANEOUS) ×6
SEALER TISSUE G2 STRG ARTC 35C (ENDOMECHANICALS) IMPLANT
SET TRI-LUMEN FLTR TB AIRSEAL (TUBING) ×2 IMPLANT
SLEEVE SURGEON STRL (DRAPES) ×1 IMPLANT
SPONGE HEMORRHOID 8X3CM (HEMOSTASIS) ×2 IMPLANT
SPONGE LAP 18X18 RF (DISPOSABLE) ×4 IMPLANT
STAPLER VISISTAT 35W (STAPLE) ×2 IMPLANT
SURGIFLO W/THROMBIN 8M KIT (HEMOSTASIS) IMPLANT
SURGILUBE 2OZ TUBE FLIPTOP (MISCELLANEOUS) ×2 IMPLANT
SUT ETHILON 2 0 PS N (SUTURE) IMPLANT
SUT MNCRL AB 4-0 PS2 18 (SUTURE) ×2 IMPLANT
SUT NOVA NAB GS-21 0 18 T12 DT (SUTURE) ×4 IMPLANT
SUT PDS AB 1 CTX 36 (SUTURE) IMPLANT
SUT PDS AB 1 TP1 96 (SUTURE) IMPLANT
SUT PROLENE 2 0 KS (SUTURE) ×2 IMPLANT
SUT SILK 2 0 (SUTURE) ×2
SUT SILK 2 0 SH CR/8 (SUTURE) ×2 IMPLANT
SUT SILK 2-0 18XBRD TIE 12 (SUTURE) ×1 IMPLANT
SUT SILK 3 0 (SUTURE) ×2
SUT SILK 3 0 SH CR/8 (SUTURE) ×2 IMPLANT
SUT SILK 3-0 18XBRD TIE 12 (SUTURE) ×1 IMPLANT
SUT VIC AB 0 CT1 27 (SUTURE) ×2
SUT VIC AB 0 CT1 27XBRD ANTBC (SUTURE) IMPLANT
SUT VIC AB 2-0 CT1 27 (SUTURE)
SUT VIC AB 2-0 CT1 TAPERPNT 27 (SUTURE) IMPLANT
SUT VIC AB 2-0 SH 18 (SUTURE) ×2 IMPLANT
SUT VIC AB 2-0 SH 27 (SUTURE) ×2
SUT VIC AB 2-0 SH 27X BRD (SUTURE) IMPLANT
SUT VIC AB 3-0 SH 27 (SUTURE) ×2
SUT VIC AB 3-0 SH 27XBRD (SUTURE) IMPLANT
SUT VIC AB 4-0 PS2 18 (SUTURE) ×2 IMPLANT
SUT VIC AB 4-0 PS2 27 (SUTURE) ×2 IMPLANT
SUT VICRYL 0 UR6 27IN ABS (SUTURE) ×1 IMPLANT
SUT VICRYL 4-0 PS2 18IN ABS (SUTURE) ×4 IMPLANT
SYR 10ML LL (SYRINGE) IMPLANT
SYR 50ML LL SCALE MARK (SYRINGE) ×1 IMPLANT
SYR CONTROL 10ML LL (SYRINGE) ×2 IMPLANT
TOWEL OR 17X26 10 PK STRL BLUE (TOWEL DISPOSABLE) ×2 IMPLANT
TOWEL OR NON WOVEN STRL DISP B (DISPOSABLE) ×4 IMPLANT
TRAP SPECIMEN MUCUS 40CC (MISCELLANEOUS) IMPLANT
TRAY COLON PACK (CUSTOM PROCEDURE TRAY) ×2 IMPLANT
TRAY CYSTO PACK (CUSTOM PROCEDURE TRAY) ×2 IMPLANT
TRAY FOLEY MTR SLVR 16FR STAT (SET/KITS/TRAYS/PACK) ×4 IMPLANT
TROCAR BLADELESS OPT 5 100 (ENDOMECHANICALS) ×1 IMPLANT
TROCAR XCEL NON-BLD 5MMX100MML (ENDOMECHANICALS) IMPLANT
TUBING CONNECTING 10 (TUBING) ×4 IMPLANT
TUBING UROLOGY SET (TUBING) IMPLANT
UNDERPAD 30X36 HEAVY ABSORB (UNDERPADS AND DIAPERS) ×2 IMPLANT
WATER STERILE IRR 1000ML POUR (IV SOLUTION) ×2 IMPLANT
YANKAUER SUCT BULB TIP 10FT TU (MISCELLANEOUS) ×2 IMPLANT

## 2019-10-30 NOTE — Anesthesia Postprocedure Evaluation (Signed)
Anesthesia Post Note  Patient: Kashmere J Masters-Marks  Procedure(s) Performed: XI ROBOTIC ASSISTED PELVIC MASS RESECTION (N/A ) CYSTOSCOPY WITH STENT PLACEMENT (N/A )     Patient location during evaluation: PACU Anesthesia Type: General Level of consciousness: awake Pain management: pain level controlled Vital Signs Assessment: post-procedure vital signs reviewed and stable Respiratory status: spontaneous breathing Cardiovascular status: stable Postop Assessment: no apparent nausea or vomiting Anesthetic complications: no   No complications documented.  Last Vitals:  Vitals:   10/30/19 1537 10/30/19 1637  BP: 123/73 125/75  Pulse: 78 80  Resp: 18 18  Temp: 36.5 C 36.6 C  SpO2: 99% 100%    Last Pain:  Vitals:   10/30/19 1637  TempSrc: Oral  PainSc:                  Aloni Chuang

## 2019-10-30 NOTE — Op Note (Signed)
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   Anesthesia: GET  Urine Output: 200 cc  Operative Findings: On EUA, fixed mass at the vaginal apex, feels adherent to the rectum but no invading. On cystoscopy with stent placement by urology, no tumor invasion noted. ON intra-abdominal entry, several loops of small bowel were adherent to the anterior abdomen along the midline and inferior to the umbilicus. Otherwise, normal appearing small bowel, omentum, liver edge, stomach and diaphragm. 6x3cm mass arising from the vaginal cuff (adherent although not attached to by a stalk) was noted, muscular in nature, with enlarged blood vessels covering parts of the mass' surface. The mass was retroperitonealized (by the bladder peritoneum anteriorly and the peritoneum of the posterior cul de sac posteriorly. Blood supply to the mass appeared to be from the uterine artery remnants, rectal mesentery and bladder. Uterus, cervix, bilateral adnexa surgically absent. Sigmoid and rectum free of any direct involvement by the tumor.   Estimated Blood Loss:  350cc      Total IV Fluids: 1400 ml         Specimens: pelvic mass         Complications:  During development of a plan between the bladder and mass, an unavoidable cystotomy was made; patient tolerated the procedure well.         Disposition: PACU - hemodynamically stable.  Procedure Details  The patient was seen in the Holding Room. The risks, benefits, complications, treatment  options, and expected outcomes were discussed with the patient.  The patient concurred with the proposed plan, giving informed consent.  The site of surgery properly noted/marked. The patient was identified as [name] and the procedure verified as a Robotic-assisted excision of pelvic mass, possible laparotomy, possible bowel surgery, and any other indicated procedures.   After induction of anesthesia, the patient was draped and prepped in the usual sterile manner. Patient was placed in supine position after anesthesia and draped and prepped in the usual sterile manner as follows: Her arms were tucked to her side with all appropriate precautions.  The shoulders were stabilized with padded shoulder blocks applied to the acromium processes.  The patient was placed in the semi-lithotomy position in [place].  The perineum and vagina were prepped with Betadine. The patient was draped after the CholoraPrep had been allowed to dry for 3 minutes.  A Time Out was held and the above information confirmed.  The surgery was turned over to Urology for cystoscopy and bilateral ureteral stent placement. Please see Dr. Purvis Sheffield note for further details.  Next, a 10 mm skin incision was made 1 cm below the subcostal margin in the midclavicular line (within previously marked LUQ ostomy site).  The 5 mm Optiview port and scope was used for direct entry.  Opening pressure was under 10 mm CO2.  The abdomen was insufflated and the findings were noted as above.   At this point and all points during the procedure, the patient's intra-abdominal pressure did not exceed 15 mmHg. Next, an 8 mm skin incision was made superior to the umbilicus and a right and left port were  placed about 8 cm lateral to the robot port on the right and left side.  A fourth arm was placed on the right.  The 5 mm assist trocar was exchanged for a 10-12 mm port. All ports were placed under direct visualization.  The patient was placed in steep  Trendelenburg.  The robot was docked in the normal manner.  Attention was first turned to the small bowel. Sharp and blunt dissection was used to lyse adhesions of the small bowel to the anterior abdominal wall. Once free, the bowel was packed into the upper abdomen.   The right and left peritoneum were opened and bilateral ureters identified.   With an EEA sizer in the vagina, the peritoneum covering the mass was incised and open using a combination of electrocautery and sharp dissection. The plan between the bladder and mass was obscured and ultimately, an unavoidable cystotomy was made during this dissection. This allowed visualization of the bladder and the vaginal cuff to facilitate dissection of the mass from both the bladder and vaginal cuff. The vagina was also entered to help in visualization. The mass was then circumferentially dissection free from peritonealized shell as well as from the rectal mesentery within the cul de sac, bilateral pelvic sidewalls, and vaginal cuff. Electrocautery was used to achieve hemostasis of feeder vessels along the way. Once freed from all attachments, the mass was placed in an endocatch bag. Attention was turned back to the colpotomy, which was extended to allow for delivery of the mass. Once removed, the mass was handed off the field to be sent pathology.   The colpotomy at the vaginal cuff was closed with Vicryl on a CT1 needle in a running manner after the bladder flap was further developed.   The cystotomy was closed in two layers, with 3-0 and 2-0 Vicryl, after visualizing both stents and ureteral orifices. Once the cystotomy was closed, the bladder was back-filled with 150cc of sterile fluid with no extravasation noted.   Irrigation was used and excellent hemostasis was achieved.  Given raw surfaces within the cul-de-sac, Arista was used. At this point in the procedure was completed.  Robotic instruments were removed under direct visulaization.  The robot  was undocked. The fascia at the 10-12 mm port was closed with 0 Vicryl on a UR-5 needle.  The subcuticular tissue was closed with 4-0 Vicryl and the skin was closed with 4-0 Monocryl in a subcuticular manner.  Dermabond was applied.    The vagina was swabbed with  minimal bleeding noted.   All sponge, lap and needle counts were correct x  3. Ureteral stents were removed and foley catheter was left in situ.  The patient was transferred to the recovery room in stable condition.  Jeral Pinch, MD

## 2019-10-30 NOTE — Plan of Care (Signed)
Plan of care 

## 2019-10-30 NOTE — Brief Op Note (Signed)
10/30/2019  11:40 AM  PATIENT:  [name]  69 y.o. female  PRE-OPERATIVE DIAGNOSIS:  PELVIC MASS  POST-OPERATIVE DIAGNOSIS:  PELVIC MASS  PROCEDURE:  Procedure(s): XI ROBOTIC ASSISTED PELVIC MASS RESECTION (N/A) CYSTOSCOPY WITH STENT PLACEMENT (N/A)  SURGEON:  Surgeon(s) and Role: Panel 1:    Lafonda Mosses, MD - Primary Panel 2:    * Lucas Mallow, MD - Primary  ASSISTANTS: Dr. Lahoma Crocker   ANESTHESIA:   general  EBL:  350 mL   BLOOD ADMINISTERED:none  DRAINS: none   LOCAL MEDICATIONS USED:  MARCAINE     SPECIMEN: pelvic mass  DISPOSITION OF SPECIMEN:  PATHOLOGY  COUNTS:  YES  TOURNIQUET:  * No tourniquets in log *  DICTATION: .Note written in EPIC  PLAN OF CARE: Admit to inpatient   PATIENT DISPOSITION:  PACU - hemodynamically stable.   Delay start of Pharmacological VTE agent (>24hrs) due to surgical blood loss or risk of bleeding: no
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
## 2019-10-30 NOTE — Anesthesia Postprocedure Evaluation (Signed)
Anesthesia Post Note  Patient: [name]  Procedure(s) Performed: XI ROBOTIC ASSISTED PELVIC MASS RESECTION (N/A ) CYSTOSCOPY WITH STENT PLACEMENT (N/A )     Patient location during evaluation: PACU Anesthesia Type: General Level of consciousness: awake Pain management: pain level controlled Vital Signs Assessment: post-procedure vital signs reviewed and stable Respiratory status: spontaneous breathing Cardiovascular status: stable Postop Assessment: no apparent nausea or vomiting Anesthetic complications: no   No complications documented.  Last Vitals:  Vitals:   10/30/19 1200 10/30/19 1215  BP: (!) 148/88 (!) 150/86  Pulse: 79 74  Resp: 12 12  Temp:    SpO2: 100% 100%    Last Pain:  Vitals:   10/30/19 1157  TempSrc:   PainSc: 0-No pain                 Zell Doucette

## 2019-10-30 NOTE — Anesthesia Procedure Notes (Signed)
Procedure Name: Intubation Date/Time: 10/30/2019 7:49 AM Performed by: West Pugh, CRNA Pre-anesthesia Checklist: Patient identified, Emergency Drugs available, Suction available, Patient being monitored and Timeout performed Patient Re-evaluated:Patient Re-evaluated prior to induction Oxygen Delivery Method: Circle system utilized Preoxygenation: Pre-oxygenation with 100% oxygen Induction Type: IV induction Ventilation: Mask ventilation without difficulty Laryngoscope Size: Mac and 3 Grade View: Grade I Tube type: Oral Tube size: 7.0 mm Number of attempts: 1 Airway Equipment and Method: Stylet Placement Confirmation: ETT inserted through vocal cords under direct vision,  positive ETCO2,  CO2 detector and breath sounds checked- equal and bilateral Secured at: 22 cm Tube secured with: Tape Dental Injury: Teeth and Oropharynx as per pre-operative assessment  Comments: [name] intubated with MAC #3 x 1 attempt. CRNA and Anesthesiologist present for procedure.
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
## 2019-10-30 NOTE — Transfer of Care (Signed)
Immediate Anesthesia Transfer of Care Note  Patient: [name]  Procedure(s) Performed: XI ROBOTIC ASSISTED PELVIC MASS RESECTION (N/A ) CYSTOSCOPY WITH STENT PLACEMENT (N/A )  Patient Location: PACU  Anesthesia Type:General  Level of Consciousness: awake, alert , oriented and patient cooperative  Airway & Oxygen Therapy: Patient Spontanous Breathing and Patient connected to face mask oxygen  Post-op Assessment: Report given to RN and Post -op Vital signs reviewed and stable  Post vital signs: Reviewed and stable  Last Vitals:  Vitals Value Taken Time  BP 148/88 10/30/19 1200  Temp 36.4 C 10/30/19 1157  Pulse 78 10/30/19 1201  Resp 11 10/30/19 1201  SpO2 100 % 10/30/19 1201  Vitals shown include unvalidated device data.  Last Pain:  Vitals:   10/30/19 1157  TempSrc:   PainSc: 0-No pain         Complications: No complications documented.

## 2019-10-30 NOTE — Op Note (Signed)
Operative Note  Preoperative diagnosis:  1.  Pelvic mass  Post operative diagnosis: 1.  Pelvic mass  Procedure(s): 1.  Cystoscopy with bilateral open-ended ureteral catheter placement  Surgeon: Link Snuffer, MD  Assistants: None  Anesthesia: General  Complications: None immediate  EBL: Minimal  Specimens: 1.  None  Drains/Catheters: 1.  Bilateral open-ended ureteral catheters 2.  Foley catheter  Intraoperative findings: 1.  Normal urethra and bladder  Indication: 69 year old female with a pelvic mass presents for resection.  Intraoperative stent placement was requested.  Description of procedure:  The patient was identified and consent was obtained.  The patient was taken to the operating room and placed in the supine position.  The patient was placed under general anesthesia.  Perioperative antibiotics were administered.  The patient was placed in dorsal lithotomy.  Patient was prepped and draped in a standard sterile fashion and a timeout was performed.  A 21 French rigid cystoscope was advanced into the urethra and into the bladder.  A sensor wire was then advanced up to the left kidney.  The open-ended ureteral catheter was advanced over the wire up to the kidney and then the scope and wire were withdrawn keeping the open-ended ureteral catheter in place.   A sensor wire was then advanced up to the right kidney under fluoroscopic guidance.  The open-ended ureteral catheter was advanced over the wire up to the kidney and then the scope and wire were withdrawn keeping the open-ended ureteral catheter in place.Foley catheter was placed and both open-ended ureteral catheters were secured and threaded through into a drainage bag.  This concluded my portion of the operation.  Patient tolerated procedure well and the case was handed over to general surgery.  Plan: Per gynecological oncology.  Urology will be available as needed.

## 2019-10-31 ENCOUNTER — Encounter: Payer: Self-pay | Admitting: Oncology

## 2019-10-31 ENCOUNTER — Encounter (HOSPITAL_COMMUNITY): Payer: Self-pay | Admitting: Gynecologic Oncology

## 2019-10-31 ENCOUNTER — Other Ambulatory Visit: Payer: Self-pay | Admitting: Gynecologic Oncology

## 2019-10-31 DIAGNOSIS — Z9350 Unspecified cystostomy status: Secondary | ICD-10-CM

## 2019-10-31 LAB — BASIC METABOLIC PANEL
Anion gap: 8 (ref 5–15)
Anion gap: 10 (ref 5–15)
BUN: 12 mg/dL (ref 8–23)
BUN: 12 mg/dL (ref 8–23)
CO2: 23 mmol/L (ref 22–32)
CO2: 22 mmol/L (ref 22–32)
Calcium: 8.7 mg/dL — ABNORMAL LOW (ref 8.9–10.3)
Calcium: 9.2 mg/dL (ref 8.9–10.3)
Chloride: 99 mmol/L (ref 98–111)
Chloride: 102 mmol/L (ref 98–111)
Creatinine, Ser: 0.97 mg/dL (ref 0.44–1.00)
Creatinine, Ser: 1.06 mg/dL — ABNORMAL HIGH (ref 0.44–1.00)
GFR calc Af Amer: >60 mL/min (ref >60)
GFR calc Af Amer: >60 mL/min (ref >60)
GFR calc non Af Amer: 60 mL/min — ABNORMAL LOW (ref >60)
GFR calc non Af Amer: 54 mL/min — ABNORMAL LOW (ref >60)
Glucose, Bld: 121 mg/dL — ABNORMAL HIGH (ref 70–99)
Glucose, Bld: 105 mg/dL — ABNORMAL HIGH (ref 70–99)
Potassium: 4.8 mmol/L (ref 3.5–5.1)
Potassium: 4.1 mmol/L (ref 3.5–5.1)
Sodium: 130 mmol/L — ABNORMAL LOW (ref 135–145)
Sodium: 134 mmol/L — ABNORMAL LOW (ref 135–145)

## 2019-10-31 LAB — CBC
HCT: 30.6 % — ABNORMAL LOW (ref 36.0–46.0)
Hemoglobin: 10 g/dL — ABNORMAL LOW (ref 12.0–15.0)
MCH: 30.6 pg (ref 26.0–34.0)
MCHC: 32.7 g/dL (ref 30.0–36.0)
MCV: 93.6 fL (ref 80.0–100.0)
Platelets: 293 10*3/uL (ref 150–400)
RBC: 3.27 MIL/uL — ABNORMAL LOW (ref 3.87–5.11)
RDW: 13.8 % (ref 11.5–15.5)
WBC: 9.8 10*3/uL (ref 4.0–10.5)
nRBC: 0 % (ref 0.0–0.2)

## 2019-10-31 LAB — HEMOGLOBIN AND HEMATOCRIT, BLOOD
HCT: 31.8 % — ABNORMAL LOW (ref 36.0–46.0)
Hemoglobin: 10.5 g/dL — ABNORMAL LOW (ref 12.0–15.0)

## 2019-10-31 MED ORDER — SENNOSIDES-DOCUSATE SODIUM 8.6-50 MG PO TABS
2.0000 | ORAL_TABLET | Freq: Every day | ORAL | 0 refills | Status: DC
Start: 2019-10-31 — End: 2020-04-14

## 2019-10-31 MED ORDER — ENOXAPARIN SODIUM 40 MG/0.4ML ~~LOC~~ SOLN: 40.0000 mg | SUBCUTANEOUS | 0 refills | Status: DC

## 2019-10-31 NOTE — Progress Notes (Signed)
Educated on lovenox administration. Pt verbalized understanding. Demonstration done.

## 2019-10-31 NOTE — Plan of Care (Signed)
All discharge instructions were given to Pt, foley care education provided. All questions were answered.

## 2019-10-31 NOTE — Progress Notes (Signed)
Met with [name] and went over foley catheter teaching and Lovenox injections. She verbalized understanding and did not have any questions. Also gave her a calender with upcoming appointments and encouraged her to call with any questions or needs.

## 2019-10-31 NOTE — Discharge Instructions (Signed)
10/30/2019  Return to work: 6 weeks  FOLEY catheter to stay in place for 10-14 days with visit in clinic to remove. You will have a retrograde cystogram prior to foley removal to check for bladder leaks.  Plan to administer once daily lovenox injections for the next two weeks to prevent blood clots.  Activity: 1. Be up and out of the bed during the day.  Take a nap if needed.  You may walk up steps but be careful and use the hand rail.  Stair climbing will tire you more than you think, you may need to stop part way and rest.   2. No lifting or straining for 6 weeks.  3. No driving for 1-2 weeks.  Do Not drive if you are taking narcotic pain medicine and feel like you can brake safely.  4. Shower daily.  Use soap and water on your incision and pat dry; don't rub.   5. No sexual activity and nothing in the vagina for 8 weeks.  Medications:  - Take ibuprofen and tylenol first line for pain control. Take these regularly (every 6 hours) to decrease the build up of pain.  - If necessary, for severe pain not relieved by ibuprofen, take tramadol.  - While taking tramadol you should take sennakot every night to reduce the likelihood of constipation. If this causes diarrhea, stop its use.  Diet: 1. Low sodium Heart Healthy Diet is recommended.  2. It is safe to use a laxative if you have difficulty moving your bowels.   Wound Care: 1. Keep clean and dry.  Shower daily.  Reasons to call the Doctor:   Fever - Oral temperature greater than 100.4 degrees Fahrenheit  Foul-smelling vaginal discharge  Difficulty urinating  Nausea and vomiting  Increased pain at the site of the incision that is unrelieved with pain medicine.  Difficulty breathing with or without chest pain  New calf pain especially if only on one side  Sudden, continuing increased vaginal bleeding with or without clots.   Follow-up: 1. See [name] in 3 weeks. Phone call in 1 week for pathology  discussion.  Contacts: For questions or concerns you should contact:  Dr. Jeral Grant at [phone] After hours and on week-ends call [phone] and ask to speak to the physician on call for Gynecologic Oncology   Indwelling Urinary Catheter Care, Adult  An indwelling urinary catheter is a thin, flexible, germ-free (sterile) tube that is placed into the bladder to help drain urine out of the body. The catheter is inserted into the part of the body that drains urine from the bladder (urethra). Urine drains from the catheter into a drainage bag outside of the body. Taking good care of your catheter will keep it working properly and help to prevent problems from developing. What are the risks?  Bacteria may get into your bladder and cause a urinary tract infection.  Urine flow can become blocked. This can happen if the catheter is not working correctly, or if you have sediment or a blood clot in your bladder or the catheter.  Tissue near the catheter may become irritated and bleed. How to wear your catheter and your drainage bag Supplies needed  Adhesive tape or a leg strap.  Alcohol wipe or soap and water (if you use tape).  A clean towel (if you use tape).  Overnight drainage bag.  Smaller drainage bag (leg bag). Wearing your catheter and bag Use adhesive tape or a leg strap to attach  your catheter to your leg.  Make sure the catheter is not pulled tight.  If a leg strap gets wet, replace it with a dry one.  If you use adhesive tape: 1. Use an alcohol wipe or soap and water to wash off any stickiness on your skin where you had tape before. 2. Use a clean towel to pat-dry the area. 3. Apply the new tape. You should have received a large overnight drainage bag and a smaller leg bag that fits underneath clothing.  You may wear the overnight bag at any time, but you should not wear the leg bag at night.  Always wear the leg bag below your knee.  Make sure the  overnight drainage bag is always lower than the level of your bladder, but do not let it touch the floor. Before you go to sleep, hang the bag inside a wastebasket that is covered by a clean plastic bag. How to care for your skin around the catheter     Supplies needed  A clean washcloth.  Water and mild soap.  A clean towel. Caring for your skin and catheter  Every day, use a clean washcloth and soapy water to clean the skin around your catheter. 1. Wash your hands with soap and water. 2. Wet a washcloth in warm water and mild soap. 3. Clean the skin around your urethra.  If you are female:  Use one hand to gently spread the folds of skin around your vagina (labia).  With the washcloth in your other hand, wipe the inner side of your labia on each side. Do this in a front-to-back direction.  If you are female:  Use one hand to pull back any skin that covers the end of your penis (foreskin).  With the washcloth in your other hand, wipe your penis in small circles. Start wiping at the tip of your penis, then move outward from the catheter.  Move the foreskin back in place, if this applies. 4. With your free hand, hold the catheter close to where it enters your body. Keep holding the catheter during cleaning so it does not get pulled out. 5. Use your other hand to clean the catheter with the washcloth.  Only wipe downward on the catheter.  Do not wipe upward toward your body, because that may push bacteria into your urethra and cause infection. 6. Use a clean towel to pat-dry the catheter and the skin around it. Make sure to wipe off all soap. 7. Wash your hands with soap and water.  Shower every day. Do not take baths.  Do not use cream, ointment, or lotion on the area where the catheter enters your body, unless your health care provider tells you to do that.  Do not use powders, sprays, or lotions on your genital area.  Check your skin around the catheter every day for  signs of infection. Check for: ? Redness, swelling, or pain. ? Fluid or blood. ? Warmth. ? Pus or a bad smell. How to empty the drainage bag Supplies needed  Rubbing alcohol.  Gauze pad or cotton ball.  Adhesive tape or a leg strap. Emptying the bag Empty your drainage bag (your overnight drainage bag or your leg bag) when it is ?- full, or at least 2-3 times a day. Clean the drainage bag according to the manufacturer's instructions or as told by your health care provider. 1. Wash your hands with soap and water. 2. Detach the drainage bag from your leg. 3.  Hold the drainage bag over the toilet or a clean container. Make sure the drainage bag is lower than your hips and bladder. This stops urine from going back into the tubing and into your bladder. 4. Open the pour spout at the bottom of the bag. 5. Empty the urine into the toilet or container. Do not let the pour spout touch any surface. This precaution is important to prevent bacteria from getting in the bag and causing infection. 6. Apply rubbing alcohol to a gauze pad or cotton ball. 7. Use the gauze pad or cotton ball to clean the pour spout. 8. Close the pour spout. 9. Attach the bag to your leg with adhesive tape or a leg strap. 10. Wash your hands with soap and water. How to change the drainage bag Supplies needed:  Alcohol wipes.  A clean drainage bag.  Adhesive tape or a leg strap. Changing the bag Replace your drainage bag with a clean bag if it leaks, starts to smell bad, or looks dirty. 1. Wash your hands with soap and water. 2. Detach the dirty drainage bag from your leg. 3. Grant the catheter with your fingers so that urine does not spill out. 4. Disconnect the catheter tube from the drainage tube at the connection valve. Do not let the tubes touch any surface. 5. Clean the end of the catheter tube with an alcohol wipe. Use a different alcohol wipe to clean the end of the drainage tube. 6. Connect the catheter  tube to the drainage tube of the clean bag. 7. Attach the clean bag to your leg with adhesive tape or a leg strap. Avoid attaching the new bag too tightly. 8. Wash your hands with soap and water. General instructions   Never pull on your catheter or try to remove it. Pulling can damage your internal tissues.  Always wash your hands before and after you handle your catheter or drainage bag. Use a mild, fragrance-free soap. If soap and water are not available, use hand sanitizer.  Always make sure there are no twists or bends (kinks) in the catheter tube.  Always make sure there are no leaks in the catheter or drainage bag.  Drink enough fluid to keep your urine pale yellow.  Do not take baths, swim, or use a hot tub.  If you are female, wipe from front to back after having a bowel movement. Contact a health care provider if:  Your urine is cloudy.  Your urine smells unusually bad.  Your catheter gets clogged.  Your catheter starts to leak.  Your bladder feels full. Get help right away if:  You have redness, swelling, or pain where the catheter enters your body.  You have fluid, blood, pus, or a bad smell coming from the area where the catheter enters your body.  The area where the catheter enters your body feels warm to the touch.  You have a fever.  You have pain in your abdomen, legs, lower back, or bladder.  You see blood in the catheter.  Your urine is pink or red.  You have nausea, vomiting, or chills.  Your urine is not draining into the bag.  Your catheter gets pulled out. Summary  An indwelling urinary catheter is a thin, flexible, germ-free (sterile) tube that is placed into the bladder to help drain urine out of the body.  The catheter is inserted into the part of the body that drains urine from the bladder (urethra).  Take good care of your  catheter to keep it working properly and help prevent problems from developing.  Always wash your hands  before and after you handle your catheter or drainage bag.  Never pull on your catheter or try to remove it. This information is not intended to replace advice given to you by your health care provider. Make sure you discuss any questions you have with your health care provider. Document Revised: 08/25/2018 Document Reviewed: 12/17/2016 Elsevier Patient Education  [employer].  Enoxaparin injection What is this medicine? ENOXAPARIN (ee nox a PA rin) is used after knee, hip, or abdominal surgeries to prevent blood clotting. It is also used to treat existing blood clots in the lungs or in the veins. This medicine may be used for other purposes; ask your health care provider or pharmacist if you have questions. COMMON BRAND NAME(S): Lovenox What should I tell my health care provider before I take this medicine? They need to know if you have any of these conditions:  bleeding disorders, hemorrhage, or hemophilia  infection of the heart or heart valves  kidney or liver disease  previous stroke  prosthetic heart valve  recent surgery or delivery of a baby  ulcer in the stomach or intestine, diverticulitis, or other bowel disease  an unusual or allergic reaction to enoxaparin, heparin, pork or pork products, other medicines, foods, dyes, or preservatives  pregnant or trying to get pregnant  breast-feeding How should I use this medicine? This medicine is for injection under the skin. It is usually given by a health-care professional. You or a family member may be trained on how to give the injections. If you are to give yourself injections, make sure you understand how to use the syringe, measure the dose if necessary, and give the injection. To avoid bruising, do not rub the site where this medicine has been injected. Do not take your medicine more often than directed. Do not stop taking except on the advice of your doctor or health care professional. Make sure you receive a  puncture-resistant container to dispose of the needles and syringes once you have finished with them. Do not reuse these items. Return the container to your doctor or health care professional for proper disposal. Talk to your pediatrician regarding the use of this medicine in children. Special care may be needed. Overdosage: If you think you have taken too much of this medicine contact a poison control center or emergency room at once. NOTE: This medicine is only for you. Do not share this medicine with others. What if I miss a dose? If you miss a dose, take it as soon as you can. If it is almost time for your next dose, take only that dose. Do not take double or extra doses. What may interact with this medicine?  aspirin and aspirin-like medicines  certain medicines that treat or prevent blood clots  dipyridamole  NSAIDs, medicines for pain and inflammation, like ibuprofen or naproxen This list may not describe all possible interactions. Give your health care provider a list of all the medicines, herbs, non-prescription drugs, or dietary supplements you use. Also tell them if you smoke, drink alcohol, or use illegal drugs. Some items may interact with your medicine. What should I watch for while using this medicine? Visit your healthcare professional for regular checks on your progress. You may need blood work done while you are taking this medicine. Your condition will be monitored carefully while you are receiving this medicine. It is important not to miss  any appointments. If you are going to need surgery or other procedure, tell your healthcare professional that you are using this medicine. Using this medicine for a long time may weaken your bones and increase the risk of bone fractures. Avoid sports and activities that might cause injury while you are using this medicine. Severe falls or injuries can cause unseen bleeding. Be careful when using sharp tools or knives. Consider using an  Copy. Take special care brushing or flossing your teeth. Report any injuries, bruising, or red spots on the skin to your healthcare professional. Wear a medical ID bracelet or chain. Carry a card that describes your disease and details of your medicine and dosage times. What side effects may I notice from receiving this medicine? Side effects that you should report to your doctor or health care professional as soon as possible:  allergic reactions like skin rash, itching or hives, swelling of the face, lips, or tongue  bone pain  signs and symptoms of bleeding such as bloody or black, tarry stools; red or dark-brown urine; spitting up blood or brown material that looks like coffee grounds; red spots on the skin; unusual bruising or bleeding from the eye, gums, or nose  signs and symptoms of a blood clot such as chest pain; shortness of breath; pain, swelling, or warmth in the leg  signs and symptoms of a stroke such as changes in vision; confusion; trouble speaking or understanding; severe headaches; sudden numbness or weakness of the face, arm or leg; trouble walking; dizziness; loss of coordination Side effects that usually do not require medical attention (report to your doctor or health care professional if they continue or are bothersome):  hair loss  pain, redness, or irritation at site where injected This list may not describe all possible side effects. Call your doctor for medical advice about side effects. You may report side effects to FDA at 1-800-FDA-1088. Where should I keep my medicine? Keep out of the reach of children. Store at room temperature between 15 and 30 degrees C (59 and 86 degrees F). Do not freeze. If your injections have been specially prepared, you may need to store them in the refrigerator. Ask your pharmacist. Throw away any unused medicine after the expiration date. NOTE: This sheet is a summary. It may not cover all possible information. If you have  questions about this medicine, talk to your doctor, pharmacist, or health care provider.  2020 Elsevier/Gold Standard (2017-04-28 11:25:34)

## 2019-10-31 NOTE — Discharge Summary (Signed)
Physician Discharge Summary  Patient ID: [name] MRN: [id number] DOB/AGE: [birth year] 69 y.o.  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   Hospital Course: On 10/30/2019, the patient underwent the following: Procedure(s): Robotic-assisted laparoscopic pelvic mass excision, repair of cystotomy, colpotomy and repair of colpotomy, removal of ureteral stents. The postoperative course was uneventful.  She was discharged to home on postoperative day 1 tolerating a regular diet, foley in place, passing flatus, pain controlled with po medications.  Consults: None  Significant Diagnostic Studies: Labs  Treatments: surgery: see above  Discharge Exam: Blood pressure 136/73, pulse 73, temperature 97.8 F (36.6 C), temperature source Oral, resp. rate 16, height 5\' 2"  (1.575 m), weight 149 lb 14.6 oz (68 kg), SpO2 100 %. General appearance: alert, cooperative and no distress Resp: clear to auscultation bilaterally Cardio: regular rate and rhythm, S1, S2 normal, no murmur, click, rub or gallop GI: soft, non-tender; bowel sounds normal; no masses,  no organomegaly Extremities: extremities normal, atraumatic, no cyanosis or edema Incision/Wound: Lap sites to the abdomen with dermabond without active drainage.  Ecchymosis noted around the incisions.   Mild hematoma noted on the right upper lip with no active drainage   Disposition: Discharge disposition: 01-Home or Self Care       Discharge Instructions    Call MD for:  difficulty breathing, headache or visual disturbances   Complete by: As directed    Call MD for:  extreme fatigue   Complete by: As directed    Call MD for:  hives   Complete by: As directed    Call MD for:  persistant  dizziness or light-headedness   Complete by: As directed    Call MD for:  persistant nausea and vomiting   Complete by: As directed    Call MD for:  redness, tenderness, or signs of infection (pain, swelling, redness, odor or green/yellow discharge around incision site)   Complete by: As directed    Call MD for:  severe uncontrolled pain   Complete by: As directed    Call MD for:  temperature >100.4   Complete by: As directed    Diet - low sodium heart healthy   Complete by: As directed    Driving Restrictions   Complete by: As directed    No driving for 1 week.  Do not take narcotics and drive.   Increase activity slowly   Complete by: As directed    Lifting restrictions   Complete by: As directed    No lifting greater than 10 lbs for 6 weeks.   Sexual Activity Restrictions   Complete by: As directed    No sexual activity, nothing in the vagina, for 8 weeks.     Allergies as of 10/31/2019      Reactions   Buprenorphine Hcl Other (See Comments)   Severe headache, confusion   Morphine And Related Other (See Comments)   Severe headache   Keflex [cephalexin] Nausea And Vomiting   Other Other (See Comments)   Trees & Grass = allergic symptoms   Codeine Other (See Comments)   Disorientation   Dust Mite Extract Cough   Molds & Smuts Cough      Medication List    TAKE these medications   albuterol 108 (  90 Base) MCG/ACT inhaler Commonly known as: VENTOLIN HFA Inhale 1-2 puffs into the lungs every 6 (six) hours as needed for wheezing or shortness of breath.   albuterol (2.5 MG/3ML) 0.083% nebulizer solution Commonly known as: PROVENTIL Take 3 mLs (2.5 mg total) by nebulization every 6 (six) hours as needed for wheezing or shortness of breath.   ALPRAZolam 0.5 MG tablet Commonly known as: XANAX TAKE 1 TABLET BY MOUTH 2 TIMES DAILY AS NEEDED FOR ANXIETY. What changed:   how much to take  how to take this  when to take this  reasons to take this  additional  instructions   aspirin EC 81 MG tablet Take 81 mg by mouth daily.   busPIRone 15 MG tablet Commonly known as: BUSPAR Take 15 mg by mouth 2 (two) times daily.   busPIRone 10 MG tablet Commonly known as: BUSPAR Take 2 tablets (20 mg total) by mouth 2 (two) times daily.   ciprofloxacin 500 MG tablet Commonly known as: Cipro Take 1 tablet (500 mg total) by mouth 2 (two) times daily. X 3 days. Begin day before next Urology appointment.   D-Mannose 500 MG Caps Take 500 mg by mouth 2 (two) times daily. WITH CRANBERRY & DANDELION EXTRACT   esomeprazole 40 MG capsule Commonly known as: NEXIUM TAKE 1 CAPSULE BY MOUTH EVERY MORNING What changed:   how much to take  how to take this  when to take this  additional instructions   estradiol 1 MG tablet Commonly known as: ESTRACE Take 1 tablet (1 mg total) by mouth daily.   famotidine 40 MG tablet Commonly known as: PEPCID TAKE 1 TABLET BY MOUTH EVERY DAY IN THE EVENING   Ferrex 150 150 MG capsule Generic drug: iron polysaccharides TAKE 1 CAPSULE BY MOUTH TWICE A DAY What changed: how much to take   Fish Oil 1200 MG Caps Take 1,200 mg by mouth daily.   FLUoxetine 40 MG capsule Commonly known as: PROZAC Take 1 capsule (40 mg total) by mouth every morning.   furosemide 20 MG tablet Commonly known as: LASIX Take 1-2 tablets (20-40 mg total) by mouth daily. What changed: how much to take   hydrochlorothiazide 12.5 MG capsule Commonly known as: MICROZIDE Take 1 capsule (12.5 mg total) by mouth daily.   ketorolac 10 MG tablet Commonly known as: TORADOL Take 1 tablet (10 mg total) by mouth every 8 (eight) hours as needed for moderate pain. Or stent discomfort post-operatively.   levothyroxine 100 MCG tablet Commonly known as: SYNTHROID TAKE 1 TABLET (100 MCG TOTAL) BY MOUTH DAILY BEFORE BREAKFAST.   mirtazapine 30 MG tablet Commonly known as: REMERON Take 1 tablet (30 mg total) by mouth at bedtime.   multivitamin  with minerals tablet Take 1 tablet by mouth daily.   nitrofurantoin (macrocrystal-monohydrate) 100 MG capsule Commonly known as: Macrobid Take 1 capsule (100 mg total) by mouth 2 (two) times daily for 5 days.   progesterone 100 MG capsule Commonly known as: PROMETRIUM Take 100 mg by mouth at bedtime.   SUMAtriptan 100 MG tablet Commonly known as: IMITREX TAKE 1/2-1 TABLET BY MOUTH EVERY 2 HOURS AS NEEDED FOR HEADACHE What changed: See the new instructions.   SYSTANE OP Place 1 drop into both eyes 2 (two) times daily as needed (dry eyes).   traMADol 50 MG tablet Commonly known as: ULTRAM Take 1 tablet (50 mg total) by mouth every 6 (six) hours as needed for severe pain. For AFTER surgery, do not take and  drive   Tylenol 110 MG tablet Generic drug: acetaminophen Take 650 mg by mouth every 6 (six) hours as needed for moderate pain.       Follow-up Information    Lafonda Mosses, MD In 1 week.   Specialty: Gynecologic Oncology Why: 3 week office visit Contact information: [address] [place] [address] [phone]               Greater than thirty minutes were spend for face to face discharge instructions and discharge orders/summary in EPIC.   Signed: Dorothyann Gibbs 10/31/2019, 8:54 AM

## 2019-11-01 ENCOUNTER — Ambulatory Visit: Payer: Medicare Other | Admitting: Gynecologic Oncology

## 2019-11-01 ENCOUNTER — Telehealth: Payer: Self-pay

## 2019-11-01 ENCOUNTER — Telehealth: Payer: Self-pay | Admitting: Oncology

## 2019-11-01 ENCOUNTER — Telehealth: Payer: Self-pay | Admitting: *Deleted

## 2019-11-01 LAB — SURGICAL PATHOLOGY

## 2019-11-01 NOTE — Telephone Encounter (Signed)
LM  For patient to call back to the office at [phone] to discuss how she is doing after her surgery and review up coming appointments.

## 2019-11-01 NOTE — Addendum Note (Signed)
Addendum  created 11/01/19 1583 by West Pugh, CRNA   Flowsheet accepted, Intraprocedure Flowsheets edited

## 2019-11-01 NOTE — Telephone Encounter (Signed)
[name] called and said she is doing well.  She was able to sleep well last night and her foley is draining well.  She gave herself her first lovenox injection today without any issues.  She is aware of her follow up on Monday with Dr. Berline Lopes.

## 2019-11-01 NOTE — Telephone Encounter (Signed)
Pt was on TCM report admitted 10/30/19 for Pelvic mass in female. Pt underwent a  Robotic-assisted laparoscopic pelvic mass excision, repair of cystotomy, colpotomy and repair of colpotomy, removal of ureteral stents. Pt D/C 10/31/19, and will follow-up w/ Dr Berline Lopes in 1 week.Marland KitchenGerilyn Nestle, MD In 1 week.   Specialty: Gynecologic Oncology

## 2019-11-02 NOTE — Telephone Encounter (Signed)
Pt called back on 11-01-19 and spoke with Elmo Putt Nurse Navigator See note 11-01-19.

## 2019-11-05 ENCOUNTER — Emergency Department (HOSPITAL_COMMUNITY)
Admission: EM | Admit: 2019-11-05 | Discharge: 2019-11-05 | Disposition: A | Discharge status: Home / Self Care | Payer: Medicare Other | Attending: Emergency Medicine | Admitting: Emergency Medicine

## 2019-11-05 ENCOUNTER — Encounter (HOSPITAL_COMMUNITY): Payer: Self-pay

## 2019-11-05 ENCOUNTER — Telehealth: Payer: Self-pay | Admitting: Internal Medicine

## 2019-11-05 ENCOUNTER — Other Ambulatory Visit: Payer: Self-pay

## 2019-11-05 ENCOUNTER — Encounter: Payer: Self-pay | Admitting: Gynecologic Oncology

## 2019-11-05 ENCOUNTER — Inpatient Hospital Stay: Payer: Medicare Other | Attending: Gynecologic Oncology | Admitting: Gynecologic Oncology

## 2019-11-05 DIAGNOSIS — Z7901 Long term (current) use of anticoagulants: Secondary | ICD-10-CM | POA: Insufficient documentation

## 2019-11-05 DIAGNOSIS — N390 Urinary tract infection, site not specified: Secondary | ICD-10-CM | POA: Insufficient documentation

## 2019-11-05 DIAGNOSIS — N183 Chronic kidney disease, stage 3 unspecified: Secondary | ICD-10-CM | POA: Insufficient documentation

## 2019-11-05 DIAGNOSIS — E039 Hypothyroidism, unspecified: Secondary | ICD-10-CM | POA: Insufficient documentation

## 2019-11-05 DIAGNOSIS — G43409 Hemiplegic migraine, not intractable, without status migrainosus: Secondary | ICD-10-CM | POA: Diagnosis not present

## 2019-11-05 DIAGNOSIS — R52 Pain, unspecified: Secondary | ICD-10-CM | POA: Diagnosis not present

## 2019-11-05 DIAGNOSIS — Z743 Need for continuous supervision: Secondary | ICD-10-CM | POA: Diagnosis not present

## 2019-11-05 DIAGNOSIS — B9689 Other specified bacterial agents as the cause of diseases classified elsewhere: Secondary | ICD-10-CM | POA: Insufficient documentation

## 2019-11-05 DIAGNOSIS — R6889 Other general symptoms and signs: Secondary | ICD-10-CM | POA: Diagnosis not present

## 2019-11-05 DIAGNOSIS — F419 Anxiety disorder, unspecified: Secondary | ICD-10-CM | POA: Diagnosis not present

## 2019-11-05 DIAGNOSIS — I129 Hypertensive chronic kidney disease with stage 1 through stage 4 chronic kidney disease, or unspecified chronic kidney disease: Secondary | ICD-10-CM | POA: Diagnosis not present

## 2019-11-05 DIAGNOSIS — R0902 Hypoxemia: Secondary | ICD-10-CM | POA: Diagnosis not present

## 2019-11-05 DIAGNOSIS — R0789 Other chest pain: Secondary | ICD-10-CM | POA: Insufficient documentation

## 2019-11-05 DIAGNOSIS — Z7982 Long term (current) use of aspirin: Secondary | ICD-10-CM | POA: Diagnosis not present

## 2019-11-05 DIAGNOSIS — C499 Malignant neoplasm of connective and soft tissue, unspecified: Secondary | ICD-10-CM

## 2019-11-05 DIAGNOSIS — G4489 Other headache syndrome: Secondary | ICD-10-CM | POA: Diagnosis not present

## 2019-11-05 DIAGNOSIS — R519 Headache, unspecified: Secondary | ICD-10-CM | POA: Diagnosis present

## 2019-11-05 LAB — CBC WITH DIFFERENTIAL/PLATELET
Abs Immature Granulocytes: 0.11 10*3/uL — ABNORMAL HIGH (ref 0.00–0.07)
Basophils Absolute: 0.1 10*3/uL (ref 0.0–0.1)
Basophils Relative: 1 %
Eosinophils Absolute: 0.5 10*3/uL (ref 0.0–0.5)
Eosinophils Relative: 5 %
HCT: 34.6 % — ABNORMAL LOW (ref 36.0–46.0)
Hemoglobin: 11.5 g/dL — ABNORMAL LOW (ref 12.0–15.0)
Immature Granulocytes: 1 %
Lymphocytes Relative: 23 %
Lymphs Abs: 2.1 10*3/uL (ref 0.7–4.0)
MCH: 31 pg (ref 26.0–34.0)
MCHC: 33.2 g/dL (ref 30.0–36.0)
MCV: 93.3 fL (ref 80.0–100.0)
Monocytes Absolute: 0.6 10*3/uL (ref 0.1–1.0)
Monocytes Relative: 7 %
Neutro Abs: 5.9 10*3/uL (ref 1.7–7.7)
Neutrophils Relative %: 63 %
Platelets: 344 10*3/uL (ref 150–400)
RBC: 3.71 MIL/uL — ABNORMAL LOW (ref 3.87–5.11)
RDW: 14.4 % (ref 11.5–15.5)
WBC: 9.4 10*3/uL (ref 4.0–10.5)
nRBC: 0 % (ref 0.0–0.2)

## 2019-11-05 LAB — BASIC METABOLIC PANEL
Anion gap: 11 (ref 5–15)
BUN: 15 mg/dL (ref 8–23)
CO2: 25 mmol/L (ref 22–32)
Calcium: 9.4 mg/dL (ref 8.9–10.3)
Chloride: 99 mmol/L (ref 98–111)
Creatinine, Ser: 1.04 mg/dL — ABNORMAL HIGH (ref 0.44–1.00)
GFR calc Af Amer: >60 mL/min (ref >60)
GFR calc non Af Amer: 55 mL/min — ABNORMAL LOW (ref >60)
Glucose, Bld: 101 mg/dL — ABNORMAL HIGH (ref 70–99)
Potassium: 3.4 mmol/L — ABNORMAL LOW (ref 3.5–5.1)
Sodium: 135 mmol/L (ref 135–145)

## 2019-11-05 LAB — URINALYSIS, ROUTINE W REFLEX MICROSCOPIC
Bilirubin Urine: NEGATIVE
Glucose, UA: NEGATIVE mg/dL
Hgb urine dipstick: NEGATIVE
Ketones, ur: NEGATIVE mg/dL
Leukocytes,Ua: NEGATIVE
Nitrite: NEGATIVE
Protein, ur: NEGATIVE mg/dL
Specific Gravity, Urine: 1.005 (ref 1.005–1.030)
pH: 6 (ref 5.0–8.0)

## 2019-11-05 LAB — TROPONIN I (HIGH SENSITIVITY): Troponin I (High Sensitivity): 5 ng/L (ref <18)

## 2019-11-05 MED ORDER — METOCLOPRAMIDE HCL 5 MG/ML IJ SOLN
10.0000 mg | Freq: Once | INTRAMUSCULAR | Status: AC
  Administered 2019-11-05: 10 mg via INTRAVENOUS
  Filled 2019-11-05: qty 2

## 2019-11-05 MED ORDER — KETOROLAC TROMETHAMINE 15 MG/ML IJ SOLN
15.0000 mg | Freq: Once | INTRAMUSCULAR | Status: AC
  Administered 2019-11-05: 15 mg via INTRAVENOUS
  Filled 2019-11-05: qty 1

## 2019-11-05 MED ORDER — SUMATRIPTAN SUCCINATE 100 MG PO TABS: ORAL_TABLET | ORAL | 5 refills | Status: DC

## 2019-11-05 MED ORDER — DIPHENHYDRAMINE HCL 50 MG/ML IJ SOLN
25.0000 mg | Freq: Once | INTRAMUSCULAR | Status: AC
  Administered 2019-11-05: 25 mg via INTRAVENOUS
  Filled 2019-11-05: qty 1

## 2019-11-05 MED ORDER — SODIUM CHLORIDE 0.9 % IV BOLUS
1000.0000 mL | Freq: Once | INTRAVENOUS | Status: AC
  Administered 2019-11-05: 1000 mL via INTRAVENOUS
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
## 2019-11-05 NOTE — Telephone Encounter (Signed)
Refill sent. See meds.  

## 2019-11-05 NOTE — Telephone Encounter (Signed)
    1.Medication Requested: SUMAtriptan (IMITREX) 100 MG tablet  2. Pharmacy (Name, Street, City):[id number] - [place], [place].  3. On Med List: yes  4. Last Visit with PCP: 08/27/19  5. Next visit date with PCP: n/a   Agent: Please be advised that RX refills may take up to 3 business days. We ask that you follow-up with your pharmacy.

## 2019-11-05 NOTE — ED Triage Notes (Signed)
Pt BIB [facility] complaining of headache and nausea. She states that her blood pressure has been high at home. Reports that BP was 190/110. She has a bladder tumor removed on Tuesday. She still has a foley in. Pt also reports that she gets similar symptoms with UTIs.
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
## 2019-11-06 ENCOUNTER — Telehealth: Payer: Self-pay | Admitting: Oncology

## 2019-11-06 ENCOUNTER — Telehealth: Payer: Self-pay

## 2019-11-06 NOTE — Telephone Encounter (Signed)
Told [name] that the radiology appointment was able to be moved to [facility] at 2 pm. She needs to arrive at 1345 at radiology to register. She does not need to see Dr. Berline Lopes Friday.  If the scan looks good then the foley catheter caan be removed by the nurse. Pt verbalized understanding.

## 2019-11-06 NOTE — Telephone Encounter (Signed)
[name] left a message regarding her cystogram on Friday.  She is concerned that it is scheduled at [facility].  Called her back and advised her that it has been rescheduled at [facility]. She verbalized understanding.

## 2019-11-07 LAB — URINE CULTURE: Culture: 40000 — AB

## 2019-11-08 ENCOUNTER — Telehealth: Payer: Self-pay | Admitting: Emergency Medicine

## 2019-11-08 NOTE — Progress Notes (Signed)
ED Antimicrobial Stewardship Positive Culture Follow Up   [name] is an 69 y.o. female who presented to [facility] on 11/05/2019 with a chief complaint of headache, n/v.  Chief Complaint  Patient presents with  . Headache    Recent Results (from the past 720 hour(s))  Urine Culture     Status: Abnormal   Collection Time: 10/24/19 10:52 AM   Specimen: Urine, Clean Catch  Result Value Ref Range Status   Specimen Description   Final    URINE, CLEAN CATCH Performed at [facility], [place] [address], [place], [place] [address]    Special Requests   Final    NONE Performed at [facility], [place] [address], [place], [place] [address]    Culture >=100,000 COLONIES/mL ENTEROCOCCUS FAECALIS (A)  Final   Report Status 10/27/2019 FINAL  Final   Organism ID, Bacteria ENTEROCOCCUS FAECALIS (A)  Final      Susceptibility   Enterococcus faecalis - MIC*    AMPICILLIN <=2 SENSITIVE Sensitive     NITROFURANTOIN <=16 SENSITIVE Sensitive     VANCOMYCIN 1 SENSITIVE Sensitive     * >=100,000 COLONIES/mL ENTEROCOCCUS FAECALIS  SARS CORONAVIRUS 2 (TAT 6-24 HRS) Nasopharyngeal Nasopharyngeal Swab     Status: None   Collection Time: 10/26/19  2:06 PM   Specimen: Nasopharyngeal Swab  Result Value Ref Range Status   SARS Coronavirus 2 NEGATIVE NEGATIVE Final    Comment: (NOTE) SARS-CoV-2 target nucleic acids are NOT DETECTED.  The SARS-CoV-2 RNA is generally detectable in upper and lower respiratory specimens during the acute phase of infection. Negative results do not preclude SARS-CoV-2 infection, do not rule out co-infections with other pathogens, and should not be used as the sole basis for treatment or other patient management decisions. Negative results must be combined with clinical observations, patient history, and epidemiological information. The expected result is Negative.  Fact Sheet for  Patients: SugarRoll.be  Fact Sheet for Healthcare Providers: https://www.woods-mathews.com/  This test is not yet approved or cleared by the [place] FDA and  has been authorized for detection and/or diagnosis of SARS-CoV-2 by FDA under an Emergency Use Authorization (EUA). This EUA will remain  in effect (meaning this test can be used) for the duration of the COVID-19 declaration under Se ction 564(b)(1) of the Act, 21 U.S.C. section 360bbb-3(b)(1), unless the authorization is terminated or revoked sooner.  Performed at [facility], [place] [address] [place], [place] [address]   Urine culture     Status: Abnormal   Collection Time: 11/05/19  4:29 AM   Specimen: Urine, Clean Catch  Result Value Ref Range Status   Specimen Description   Final    URINE, CLEAN CATCH Performed at [facility], [place] [address], [place], [place] [address]    Special Requests   Final    NONE Performed at [facility], [place] [address] [place],  [address]    Culture 40,000 COLONIES/mL ENTEROCOCCUS FAECALIS (A)  Final   Report Status 11/07/2019 FINAL  Final   Organism ID, Bacteria ENTEROCOCCUS FAECALIS (A)  Final      Susceptibility   Enterococcus faecalis - MIC*    AMPICILLIN <=2 SENSITIVE Sensitive     NITROFURANTOIN <=16 SENSITIVE Sensitive     VANCOMYCIN 1 SENSITIVE Sensitive     * 40,000 COLONIES/mL ENTEROCOCCUS FAECALIS     [x]  Patient discharged originally without antimicrobial agent and treatment is now indicated  Plan: - start  amoxicillin 500 mg TID for 7 days   ED Provider: Alfredia Client, PA-C   Lynelle Doctor 11/08/2019, 11:35 AM Clinical Pharmacist [phone]

## 2019-11-08 NOTE — Telephone Encounter (Signed)
Post ED Visit - Positive Culture Follow-up: Successful Patient Follow-Up  Culture assessed and recommendations reviewed by:  []  Elenor Quinones, Pharm.D. []  Heide Guile, Pharm.D., BCPS AQ-ID []  Parks Neptune, Pharm.D., BCPS []  Alycia Rossetti, Pharm.D., BCPS []  Archer, Pharm.D., BCPS, AAHIVP []  Legrand Como, Pharm.D., BCPS, AAHIVP []  Salome Arnt, PharmD, BCPS []  Johnnette Gourd, PharmD, BCPS []  Hughes Better, PharmD, BCPS []  Leeroy Cha, PharmD  Positive urine culture  [x]  Patient discharged without antimicrobial prescription and treatment is now indicated []  Organism is resistant to prescribed ED discharge antimicrobial []  Patient with positive blood cultures  Changes discussed with ED provider: Alfredia Client PA New antibiotic prescription start Amoxicillin 500mg  po tid x 7 days  Attempting to contact patient   Hazle Nordmann 11/08/2019, 3:17 PM

## 2019-11-09 ENCOUNTER — Inpatient Hospital Stay: Payer: Medicare Other | Admitting: Gynecologic Oncology

## 2019-11-09 ENCOUNTER — Telehealth: Payer: Self-pay

## 2019-11-09 ENCOUNTER — Ambulatory Visit (HOSPITAL_COMMUNITY)
Admission: RE | Admit: 2019-11-09 | Discharge: 2019-11-09 | Disposition: A | Discharge status: Home / Self Care | Payer: Medicare Other | Source: Ambulatory Visit | Attending: Gynecologic Oncology | Admitting: Gynecologic Oncology

## 2019-11-09 ENCOUNTER — Other Ambulatory Visit: Payer: Self-pay

## 2019-11-09 ENCOUNTER — Other Ambulatory Visit (HOSPITAL_COMMUNITY): Payer: Medicare Other

## 2019-11-09 VITALS — BP 122/89 | HR 90 | Temp 97.8°F | Resp 20 | Ht 62.0 in | Wt 154.8 lb

## 2019-11-09 DIAGNOSIS — Z9350 Unspecified cystostomy status: Secondary | ICD-10-CM | POA: Insufficient documentation

## 2019-11-09 DIAGNOSIS — Z9889 Other specified postprocedural states: Secondary | ICD-10-CM | POA: Diagnosis not present

## 2019-11-09 NOTE — Telephone Encounter (Signed)
Dr. Berline Lopes called from the [place] stating that Ms [facility] cystogram look fine. Nursing was to proceed with instilling 300 cc NS in to bladder vial foley and then remove foley catheter. Dr. Berline Lopes stated that if Ms [facility] voids greater then 200 cc of urine the foly can remain out and she can go home.  Procedure done with patient tolerating 300 cc instilled into her bladder. Foley removed.  Pt helped to beside commode.   She was able to void 350 cc. Pt dressed and was instructed to keep post-op appointment on 11-22-19 @ 1345 as scheduled. She is to call if any questions or concerns, especially feeling that her bladder is not fully emptying with voiding.

## 2019-11-11 NOTE — Progress Notes (Signed)
This encounter was created in error - please disregard.

## 2019-11-12 ENCOUNTER — Other Ambulatory Visit: Payer: Self-pay | Admitting: Oncology

## 2019-11-12 ENCOUNTER — Telehealth: Payer: Self-pay | Admitting: Oncology

## 2019-11-12 DIAGNOSIS — C55 Malignant neoplasm of uterus, part unspecified: Secondary | ICD-10-CM

## 2019-11-12 NOTE — Progress Notes (Signed)
Gynecologic Oncology Multi-Disciplinary Disposition Conference Note  Date of the Conference: 11/12/2019  Patient Name: [name] [facility]  Primary GYN Oncologist: Dr. Berline Lopes  Stage/Disposition:  Recurrent leiomyosarcoma. Disposition is for radiation therapy to the pelvis. Consider chemotherapy for recurrences.   This Multidisciplinary conference took place involving physicians from [place], [place], Radiation Oncology, Pathology, Radiology along with the Gynecologic Oncology Nurse Practitioner and RN.  Comprehensive assessment of the patient's malignancy, staging, need for surgery, chemotherapy, radiation therapy, and need for further testing were reviewed. Supportive measures, both inpatient and following discharge were also discussed. The recommended plan of care is documented. Greater than 35 minutes were spent correlating and coordinating this patient's care.

## 2019-11-12 NOTE — Telephone Encounter (Signed)
[name] called and asked about the GYN Conference recommendations from this morning.  Advised her that the recommendations are for pelvic radiation therapy to begin 6-8 weeks after surgery and then chemotherapy in the future if she has a recurrence.  She verbalized understanding and agreement.

## 2019-11-13 ENCOUNTER — Telehealth: Payer: Self-pay | Admitting: Gynecologic Oncology

## 2019-11-13 NOTE — Telephone Encounter (Signed)
Called the patient to review tumor board discussion. Reviewed rationale for using radiation to sterilize bed of tumor, save chemotherapy since no metastatic disease on CT or PET. Patient also understanding of need to wait longer prior to initiation of RT given cystotomy and repair.   Jeral Pinch MD Gynecologic Oncology

## 2019-11-22 ENCOUNTER — Inpatient Hospital Stay: Payer: Medicare Other | Attending: Gynecologic Oncology | Admitting: Gynecologic Oncology

## 2019-11-22 ENCOUNTER — Inpatient Hospital Stay: Payer: Medicare Other

## 2019-11-22 ENCOUNTER — Inpatient Hospital Stay: Payer: Medicare Other | Admitting: Internal Medicine

## 2019-11-22 ENCOUNTER — Other Ambulatory Visit: Payer: Self-pay

## 2019-11-22 VITALS — BP 125/77 | HR 88 | Temp 97.6°F | Resp 20 | Ht 62.0 in | Wt 159.0 lb

## 2019-11-22 DIAGNOSIS — K219 Gastro-esophageal reflux disease without esophagitis: Secondary | ICD-10-CM | POA: Insufficient documentation

## 2019-11-22 DIAGNOSIS — Z9221 Personal history of antineoplastic chemotherapy: Secondary | ICD-10-CM | POA: Insufficient documentation

## 2019-11-22 DIAGNOSIS — Z86718 Personal history of other venous thrombosis and embolism: Secondary | ICD-10-CM | POA: Diagnosis not present

## 2019-11-22 DIAGNOSIS — R300 Dysuria: Secondary | ICD-10-CM | POA: Diagnosis not present

## 2019-11-22 DIAGNOSIS — Z90722 Acquired absence of ovaries, bilateral: Secondary | ICD-10-CM | POA: Insufficient documentation

## 2019-11-22 DIAGNOSIS — Z7901 Long term (current) use of anticoagulants: Secondary | ICD-10-CM | POA: Insufficient documentation

## 2019-11-22 DIAGNOSIS — Z90710 Acquired absence of both cervix and uterus: Secondary | ICD-10-CM | POA: Insufficient documentation

## 2019-11-22 DIAGNOSIS — M1990 Unspecified osteoarthritis, unspecified site: Secondary | ICD-10-CM | POA: Diagnosis not present

## 2019-11-22 DIAGNOSIS — N132 Hydronephrosis with renal and ureteral calculous obstruction: Secondary | ICD-10-CM | POA: Insufficient documentation

## 2019-11-22 DIAGNOSIS — E039 Hypothyroidism, unspecified: Secondary | ICD-10-CM | POA: Insufficient documentation

## 2019-11-22 DIAGNOSIS — C55 Malignant neoplasm of uterus, part unspecified: Secondary | ICD-10-CM

## 2019-11-22 DIAGNOSIS — E785 Hyperlipidemia, unspecified: Secondary | ICD-10-CM | POA: Diagnosis not present

## 2019-11-22 DIAGNOSIS — J449 Chronic obstructive pulmonary disease, unspecified: Secondary | ICD-10-CM | POA: Diagnosis not present

## 2019-11-22 DIAGNOSIS — Z79899 Other long term (current) drug therapy: Secondary | ICD-10-CM | POA: Diagnosis not present

## 2019-11-22 DIAGNOSIS — I129 Hypertensive chronic kidney disease with stage 1 through stage 4 chronic kidney disease, or unspecified chronic kidney disease: Secondary | ICD-10-CM | POA: Insufficient documentation

## 2019-11-22 DIAGNOSIS — N183 Chronic kidney disease, stage 3 unspecified: Secondary | ICD-10-CM | POA: Insufficient documentation

## 2019-11-22 DIAGNOSIS — Z7982 Long term (current) use of aspirin: Secondary | ICD-10-CM | POA: Insufficient documentation

## 2019-11-22 DIAGNOSIS — M797 Fibromyalgia: Secondary | ICD-10-CM | POA: Diagnosis not present

## 2019-11-22 DIAGNOSIS — K589 Irritable bowel syndrome without diarrhea: Secondary | ICD-10-CM | POA: Diagnosis not present

## 2019-11-22 LAB — URINALYSIS, COMPLETE (UACMP) WITH MICROSCOPIC
Bilirubin Urine: NEGATIVE
Glucose, UA: NEGATIVE mg/dL
Ketones, ur: NEGATIVE mg/dL
Nitrite: NEGATIVE
Protein, ur: NEGATIVE mg/dL
Specific Gravity, Urine: 1.011 (ref 1.005–1.030)
WBC, UA: >50 WBC/hpf — ABNORMAL HIGH (ref 0–5)
pH: 5 (ref 5.0–8.0)

## 2019-11-22 NOTE — Patient Instructions (Signed)
You are healing very well from surgery!  We will let you know if your urine looks like there is an infection.  Remember no lifting pushing or pulling until you are 6 weeks out from surgery more than 10-15 pounds.  I will see you after you finish radiation treatment

## 2019-11-22 NOTE — Addendum Note (Signed)
Addended by: Baruch Merl on: 11/22/2019 02:37 PM   Modules accepted: Orders
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
## 2019-11-23 ENCOUNTER — Telehealth: Payer: Self-pay

## 2019-11-23 ENCOUNTER — Other Ambulatory Visit: Payer: Self-pay | Admitting: Gynecologic Oncology

## 2019-11-23 DIAGNOSIS — R0602 Shortness of breath: Secondary | ICD-10-CM

## 2019-11-23 DIAGNOSIS — R5383 Other fatigue: Secondary | ICD-10-CM

## 2019-11-23 DIAGNOSIS — N390 Urinary tract infection, site not specified: Secondary | ICD-10-CM

## 2019-11-23 MED ORDER — CIPROFLOXACIN HCL 250 MG PO TABS: 250.0000 mg | ORAL_TABLET | Freq: Two times a day (BID) | ORAL | 0 refills | Status: DC

## 2019-11-23 NOTE — Telephone Encounter (Signed)
Ms Lanell Matar continues with burning and urgency with urination.  She is afebrile. Denies chills. She states that she is very tired and SOB with exertion since her surgery. She feels that she should be feeling better. She does not have and pain in her legs when bearing weight. No redness or swelling in calves. Pt completed post op lovenox and is on ASA 81 mg. Continues with H/A as well. Reviewed above with Joylene John, NP and Dr. Denman George. Will begin cipro 250 mg bid x 5 days for Klebsiella  On culture. A waiting sensitivities. Dr. Berline Lopes had reviewed lab and notes regarding fatigue. Will bring patient in to the office for a CBC and c-met on Monday 11-26-19 at 2 pm. Will follow up with patient on Monday regarding results. Pt instructed to go to ED if any acute CP or SOB occurs to be evaluated. Pt verbalized understanding.

## 2019-11-23 NOTE — Telephone Encounter (Addendum)
LM for patient to call back to discuss the results of urine culture.

## 2019-11-24 LAB — URINE CULTURE: Culture: >=100000 — AB

## 2019-11-26 ENCOUNTER — Inpatient Hospital Stay: Payer: Medicare Other

## 2019-11-26 ENCOUNTER — Other Ambulatory Visit: Payer: Self-pay

## 2019-11-26 ENCOUNTER — Other Ambulatory Visit: Payer: Self-pay | Admitting: Internal Medicine

## 2019-11-26 DIAGNOSIS — Z7901 Long term (current) use of anticoagulants: Secondary | ICD-10-CM | POA: Diagnosis not present

## 2019-11-26 DIAGNOSIS — I129 Hypertensive chronic kidney disease with stage 1 through stage 4 chronic kidney disease, or unspecified chronic kidney disease: Secondary | ICD-10-CM | POA: Diagnosis not present

## 2019-11-26 DIAGNOSIS — Z86718 Personal history of other venous thrombosis and embolism: Secondary | ICD-10-CM | POA: Diagnosis not present

## 2019-11-26 DIAGNOSIS — R0602 Shortness of breath: Secondary | ICD-10-CM

## 2019-11-26 DIAGNOSIS — M1990 Unspecified osteoarthritis, unspecified site: Secondary | ICD-10-CM | POA: Diagnosis not present

## 2019-11-26 DIAGNOSIS — N183 Chronic kidney disease, stage 3 unspecified: Secondary | ICD-10-CM | POA: Diagnosis not present

## 2019-11-26 DIAGNOSIS — E039 Hypothyroidism, unspecified: Secondary | ICD-10-CM | POA: Diagnosis not present

## 2019-11-26 DIAGNOSIS — K219 Gastro-esophageal reflux disease without esophagitis: Secondary | ICD-10-CM | POA: Diagnosis not present

## 2019-11-26 DIAGNOSIS — Z9221 Personal history of antineoplastic chemotherapy: Secondary | ICD-10-CM | POA: Diagnosis not present

## 2019-11-26 DIAGNOSIS — Z7982 Long term (current) use of aspirin: Secondary | ICD-10-CM | POA: Diagnosis not present

## 2019-11-26 DIAGNOSIS — R300 Dysuria: Secondary | ICD-10-CM | POA: Diagnosis not present

## 2019-11-26 DIAGNOSIS — K589 Irritable bowel syndrome without diarrhea: Secondary | ICD-10-CM | POA: Diagnosis not present

## 2019-11-26 DIAGNOSIS — C55 Malignant neoplasm of uterus, part unspecified: Secondary | ICD-10-CM | POA: Diagnosis not present

## 2019-11-26 DIAGNOSIS — N132 Hydronephrosis with renal and ureteral calculous obstruction: Secondary | ICD-10-CM | POA: Diagnosis not present

## 2019-11-26 DIAGNOSIS — E785 Hyperlipidemia, unspecified: Secondary | ICD-10-CM | POA: Diagnosis not present

## 2019-11-26 DIAGNOSIS — R5383 Other fatigue: Secondary | ICD-10-CM

## 2019-11-26 DIAGNOSIS — Z79899 Other long term (current) drug therapy: Secondary | ICD-10-CM | POA: Diagnosis not present

## 2019-11-26 DIAGNOSIS — J449 Chronic obstructive pulmonary disease, unspecified: Secondary | ICD-10-CM | POA: Diagnosis not present

## 2019-11-26 DIAGNOSIS — M797 Fibromyalgia: Secondary | ICD-10-CM | POA: Diagnosis not present

## 2019-11-26 LAB — CBC WITH DIFFERENTIAL (CANCER CENTER ONLY)
Abs Immature Granulocytes: 0.02 10*3/uL (ref 0.00–0.07)
Basophils Absolute: 0.1 10*3/uL (ref 0.0–0.1)
Basophils Relative: 1 %
Eosinophils Absolute: 0.5 10*3/uL (ref 0.0–0.5)
Eosinophils Relative: 8 %
HCT: 36.8 % (ref 36.0–46.0)
Hemoglobin: 11.9 g/dL — ABNORMAL LOW (ref 12.0–15.0)
Immature Granulocytes: 0 %
Lymphocytes Relative: 32 %
Lymphs Abs: 2 10*3/uL (ref 0.7–4.0)
MCH: 31.2 pg (ref 26.0–34.0)
MCHC: 32.3 g/dL (ref 30.0–36.0)
MCV: 96.3 fL (ref 80.0–100.0)
Monocytes Absolute: 0.5 10*3/uL (ref 0.1–1.0)
Monocytes Relative: 8 %
Neutro Abs: 3.3 10*3/uL (ref 1.7–7.7)
Neutrophils Relative %: 51 %
Platelet Count: 356 10*3/uL (ref 150–400)
RBC: 3.82 MIL/uL — ABNORMAL LOW (ref 3.87–5.11)
RDW: 14.4 % (ref 11.5–15.5)
WBC Count: 6.4 10*3/uL (ref 4.0–10.5)
nRBC: 0 % (ref 0.0–0.2)

## 2019-11-26 LAB — COMPREHENSIVE METABOLIC PANEL
ALT: 18 U/L (ref 0–44)
AST: 20 U/L (ref 15–41)
Albumin: 3.5 g/dL (ref 3.5–5.0)
Alkaline Phosphatase: 119 U/L (ref 38–126)
Anion gap: 10 (ref 5–15)
BUN: 20 mg/dL (ref 8–23)
CO2: 24 mmol/L (ref 22–32)
Calcium: 10.2 mg/dL (ref 8.9–10.3)
Chloride: 108 mmol/L (ref 98–111)
Creatinine, Ser: 1.26 mg/dL — ABNORMAL HIGH (ref 0.44–1.00)
GFR calc Af Amer: 50 mL/min — ABNORMAL LOW (ref >60)
GFR calc non Af Amer: 43 mL/min — ABNORMAL LOW (ref >60)
Glucose, Bld: 103 mg/dL — ABNORMAL HIGH (ref 70–99)
Potassium: 4.3 mmol/L (ref 3.5–5.1)
Sodium: 142 mmol/L (ref 135–145)
Total Bilirubin: <0.2 mg/dL — ABNORMAL LOW (ref 0.3–1.2)
Total Protein: 7 g/dL (ref 6.5–8.1)

## 2019-11-26 NOTE — Telephone Encounter (Signed)
LM stating that the organism growing on the urine culture is sensitive to the cipro ATB  That she is on.  Will talke with her after her labs today.  Hopefully she is feeling better.

## 2019-11-26 NOTE — Telephone Encounter (Signed)
Told Ms Stammen that her Hgb was good at 11.9 Pt continues to feel tired. Her kidney function is a little elevated ant the GFR is 43.  Joylene John, NP said to increase fluids. She should begin to feel better in a few days with kidney infection clearing.  Pt to call the office if her symptoms persist.

## 2019-11-27 ENCOUNTER — Ambulatory Visit (INDEPENDENT_AMBULATORY_CARE_PROVIDER_SITE_OTHER): Payer: Medicare Other | Admitting: Psychology

## 2019-11-27 ENCOUNTER — Ambulatory Visit: Payer: Medicare Other | Admitting: Cardiology

## 2019-11-27 DIAGNOSIS — F4321 Adjustment disorder with depressed mood: Secondary | ICD-10-CM | POA: Diagnosis not present

## 2019-11-29 ENCOUNTER — Telehealth: Payer: Self-pay

## 2019-11-29 NOTE — Telephone Encounter (Signed)
LM for patient to call the office back with an update.

## 2019-11-30 ENCOUNTER — Ambulatory Visit: Payer: Medicare Other | Admitting: Cardiology

## 2019-12-03 ENCOUNTER — Encounter: Payer: Self-pay | Admitting: Gynecologic Oncology
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
## 2019-12-05 NOTE — Progress Notes (Signed)
Initial Outpatient Consultation  Name: [name] MRN: [id number]  Date: 12/06/2019  DOB: 24-Jul-1950  [id number], Charlotte Lacks, MD  Charlotte Gibbs, NP   REFERRING PHYSICIAN: Joylene John D, NP  DIAGNOSIS: There were no encounter diagnoses.  [name] arising from the vaginal cuff s/p hysterectomy now with a pelvic mass concerning for disease recurrence  HISTORY OF PRESENT ILLNESS::[name] is a 69 y.o. female who is seen as a courtesy of Dr. Berline Lopes for an opinion concerning radiation therapy as part of management for her recently diagnosed . Today, she is accompanied by . Initially, the patient underwent a laparoscopic-assisted vaginal hysterectomy with bilateral salpingo-oophorectomy in 2005 for symptomatic fibroid uterus. Pathology from that procedure showed benign proliferative endometrium, adenomyosis, intramural leiomyomata, benign ovaries and fallopian tubes, cervix with microglandular endocervical hyperplasia, and inclusion cysts.  The patient was seen by Dr. Matthew Saras, OB/GYN, for suspected vaginal fibroid in 2013. She presented initially with vaginal discharge and was noted to have a vascular mass at the vaginal apex. Surgical intervention was recommended. Prior to the planned surgery however, the patient began having profuse vaginal bleeding and presented emergently to the ED. She was taken to the operating room on 04/02/2012 where she underwent bilateral ureteral stent placement, exploratory laparotomy, and resection of vaginal mass with a concurrent Burch procedure. Final pathology unexpectedly showed [name] of the vaginal apex. She was started on Estrace and Prometrium post-operatively.  The patient underwent chemotherapy with four cycles of Gemzar and Taxotere from February of 2014 - 10/05/2012.  CT of chest, abdomen, and pelvis on 11/20/2012 showed a stable right upper lobe lung nodule and stable, normal-sized retroperitoneal lymph nodes. There was  no evidence of recurrent or metastatic disease at that time. PET scan on that same day did not show any worrisome hypermetabolism.  The patient had established care with Dr. Fermin Schwab in March of 2017 and did not have any evidence of disease at that time. She was last seen in November of 2018.   Continued PET/CT imaging remained stable from 2014 until 2020. However, most recent CT of abdomen and pelvis on 08/31/2019 for worsening abdominal pain showed interval development of a 5.3 x 3.9 cm mass in the posterior pelvis that was concerning for malignancy. It also showed bilateral nephrolithiasis with a 7 mm calculus in the proximal left ureter resulting in mild left hydronephrosis, an 11 mm calculus in the upper pole collecting system of the left kidney, and a 9 mm distal right ureteral calculus that did not appear to result in significant obstruction.   Given the above findings, the patient underwent urgent renal decompression and cytoscopy with bilateral pyelograms and bilateral ureteral stent placement on 09/04/2019 that was performed by Dr. Tresa Moore.  The patient was referred to Dr. Berline Lopes and was seen in consultation on 09/20/2019. At that time, it was recommended that she proceed with surgical intervention for mass excision.  MRI of abdomen and pelvis on 09/21/2019 showed concern for loss of the fat plane between both the mass and the bladder as well as the mass and the colon posteriorly. Given proximity of the ureteral orifices, it was recommended that the patient have ureteral stents placed at the time of surgery if there is bladder invasion.  PET scan on 10/01/2019 showed a 5.3 cm soft tissue mass in the central pelvis that was hypermetabolic and highly suspicious for recurrent/metastatic disease. There was no evidence of hypermetabolic lymph nodes in the abdomen or pelvis to suggest nodal metastases. There were no additional  unexpected sites of hypermetabolism in the abdomen or pelvis.  The  patient underwent a robotic-assisted laparoscopic pelvic mass excision, repair of cystotomy, colpotomy and repair of colpotomy, and removal of ureteral stents on 10/30/2019 that was performed by Dr. Berline Lopes. Pathology from the procedure revealed [name] that extended to the inked resection edges.  Of note, the patient was seen in the ED on 11/05/2019 with chief complaint of migraine headache. She also reported chest discomfort. Her symptoms improved after IV fluids and medications and she was discharged home.  The patient underwent a cystogram on 11/09/2019 that did not show any evidence of a bladder leak.  The patient's case was presented at the Gynecologic Oncology Multi-Disciplinary Conference on 11/12/2019, during which time it was recommended that she proceed with radiation therapy to the pelvis and possible chemotherapy for recurrences.  The patient was last seen by Dr. Berline Lopes on 11/22/2019, during which time she was noted to be doing well overall and they discussed proceeding with adjuvant radiation therapy.   Past/Anticipated interventions by medical oncology, if any: none  Weight changes, if any: gained 10 lbs in 4 months  Bowel/Bladder complaints, if any: chronic UTI's.cystitis, chronic IBS with diarrhea  Nausea/Vomiting, if any: occ nausea  Pain issues, if any:  ues right hip and left knee  SAFETY ISSUES:  Prior radiation? no  Pacemaker/ICD? np  Possible current pregnancy? Hysterectomy  Is the patient on methotrexate? no  Current Complaints / other details:    BP (!) 137/79 (BP Location: Right Arm, Patient Position: Sitting, Cuff Size: Normal)   Pulse 87   Temp 97.8 F (36.6 C)   Resp 18   Ht 5\' 2"  (1.575 m)   Wt 160 lb 9.6 oz (72.8 kg)   SpO2 98%   BMI 29.37 kg/m   Wt Readings from Last 3 Encounters:  12/06/19 160 lb 9.6 oz (72.8 kg)  12/06/19 160 lb (72.6 kg)  11/22/19 159 lb (72.1 kg)

## 2019-12-06 ENCOUNTER — Other Ambulatory Visit: Payer: Self-pay

## 2019-12-06 ENCOUNTER — Ambulatory Visit
Admission: RE | Admit: 2019-12-06 | Discharge: 2019-12-06 | Disposition: A | Discharge status: Home / Self Care | Payer: Medicare Other | Source: Ambulatory Visit | Attending: Radiation Oncology | Admitting: Radiation Oncology

## 2019-12-06 ENCOUNTER — Encounter: Payer: Self-pay | Admitting: Radiation Oncology

## 2019-12-06 ENCOUNTER — Ambulatory Visit (INDEPENDENT_AMBULATORY_CARE_PROVIDER_SITE_OTHER): Payer: Medicare Other | Admitting: Internal Medicine

## 2019-12-06 ENCOUNTER — Encounter: Payer: Self-pay | Admitting: Internal Medicine

## 2019-12-06 DIAGNOSIS — C55 Malignant neoplasm of uterus, part unspecified: Secondary | ICD-10-CM | POA: Diagnosis present

## 2019-12-06 DIAGNOSIS — Z90710 Acquired absence of both cervix and uterus: Secondary | ICD-10-CM | POA: Insufficient documentation

## 2019-12-06 DIAGNOSIS — C549 Malignant neoplasm of corpus uteri, unspecified: Secondary | ICD-10-CM

## 2019-12-06 DIAGNOSIS — M797 Fibromyalgia: Secondary | ICD-10-CM | POA: Insufficient documentation

## 2019-12-06 DIAGNOSIS — Z79899 Other long term (current) drug therapy: Secondary | ICD-10-CM | POA: Diagnosis not present

## 2019-12-06 DIAGNOSIS — J449 Chronic obstructive pulmonary disease, unspecified: Secondary | ICD-10-CM | POA: Insufficient documentation

## 2019-12-06 DIAGNOSIS — Z90722 Acquired absence of ovaries, bilateral: Secondary | ICD-10-CM | POA: Insufficient documentation

## 2019-12-06 DIAGNOSIS — N183 Chronic kidney disease, stage 3 unspecified: Secondary | ICD-10-CM | POA: Insufficient documentation

## 2019-12-06 DIAGNOSIS — E039 Hypothyroidism, unspecified: Secondary | ICD-10-CM | POA: Diagnosis not present

## 2019-12-06 DIAGNOSIS — R5381 Other malaise: Secondary | ICD-10-CM | POA: Diagnosis not present

## 2019-12-06 DIAGNOSIS — R911 Solitary pulmonary nodule: Secondary | ICD-10-CM | POA: Diagnosis not present

## 2019-12-06 DIAGNOSIS — Z87442 Personal history of urinary calculi: Secondary | ICD-10-CM | POA: Diagnosis not present

## 2019-12-06 DIAGNOSIS — M129 Arthropathy, unspecified: Secondary | ICD-10-CM | POA: Insufficient documentation

## 2019-12-06 DIAGNOSIS — I129 Hypertensive chronic kidney disease with stage 1 through stage 4 chronic kidney disease, or unspecified chronic kidney disease: Secondary | ICD-10-CM | POA: Insufficient documentation

## 2019-12-06 DIAGNOSIS — Z86718 Personal history of other venous thrombosis and embolism: Secondary | ICD-10-CM | POA: Insufficient documentation

## 2019-12-06 DIAGNOSIS — K589 Irritable bowel syndrome without diarrhea: Secondary | ICD-10-CM | POA: Insufficient documentation

## 2019-12-06 DIAGNOSIS — I10 Essential (primary) hypertension: Secondary | ICD-10-CM

## 2019-12-06 DIAGNOSIS — R1900 Intra-abdominal and pelvic swelling, mass and lump, unspecified site: Secondary | ICD-10-CM

## 2019-12-06 DIAGNOSIS — R5383 Other fatigue: Secondary | ICD-10-CM

## 2019-12-06 DIAGNOSIS — C499 Malignant neoplasm of connective and soft tissue, unspecified: Secondary | ICD-10-CM | POA: Diagnosis not present

## 2019-12-06 DIAGNOSIS — N132 Hydronephrosis with renal and ureteral calculous obstruction: Secondary | ICD-10-CM | POA: Insufficient documentation

## 2019-12-06 DIAGNOSIS — Z7982 Long term (current) use of aspirin: Secondary | ICD-10-CM | POA: Insufficient documentation

## 2019-12-06 DIAGNOSIS — E785 Hyperlipidemia, unspecified: Secondary | ICD-10-CM | POA: Insufficient documentation

## 2019-12-06 DIAGNOSIS — K219 Gastro-esophageal reflux disease without esophagitis: Secondary | ICD-10-CM | POA: Diagnosis not present

## 2019-12-06 DIAGNOSIS — R0789 Other chest pain: Secondary | ICD-10-CM | POA: Diagnosis not present

## 2019-12-06 NOTE — Assessment & Plan Note (Signed)
Maxzide, Micardis, Bystolic

## 2019-12-06 NOTE — Assessment & Plan Note (Signed)
Better post-op 

## 2019-12-06 NOTE — Progress Notes (Signed)
Subjective:  Patient ID: [name], female    DOB: 1951-04-30  Age: 69 y.o. MRN: [id number]  CC: No chief complaint on file.   HPI [name] presents for abd pain f/u Recovering from abd surgery, 3 urologic procedures Just finished Cipro for UTI Starting XRT this PM   Outpatient Medications Prior to Visit  Medication Sig Dispense Refill  . acetaminophen (TYLENOL) 325 MG tablet Take 650 mg by mouth every 6 (six) hours as needed for moderate pain.     Marland Kitchen albuterol (PROVENTIL) (2.5 MG/3ML) 0.083% nebulizer solution Take 3 mLs (2.5 mg total) by nebulization every 6 (six) hours as needed for wheezing or shortness of breath. 75 mL 0  . albuterol (VENTOLIN HFA) 108 (90 Base) MCG/ACT inhaler Inhale 1-2 puffs into the lungs every 6 (six) hours as needed for wheezing or shortness of breath. 8 g 0  . ALPRAZolam (XANAX) 0.5 MG tablet TAKE 1 TABLET BY MOUTH 2 TIMES DAILY AS NEEDED FOR ANXIETY. (Patient taking differently: Take 0.5 mg by mouth 2 (two) times daily as needed for anxiety. ) 60 tablet 3  . aspirin EC 81 MG tablet Take 81 mg by mouth daily.    . busPIRone (BUSPAR) 15 MG tablet Take 15 mg by mouth 2 (two) times daily.    . D-Mannose 500 MG CAPS Take 500 mg by mouth 2 (two) times daily. WITH CRANBERRY & DANDELION EXTRACT    . esomeprazole (NEXIUM) 40 MG capsule TAKE 1 CAPSULE BY MOUTH EVERY MORNING (Patient taking differently: Take 40 mg by mouth daily. ) 30 capsule 0  . estradiol (ESTRACE) 1 MG tablet Take 1 tablet (1 mg total) by mouth daily. 90 tablet 3  . FERREX 150 150 MG capsule TAKE 1 CAPSULE BY MOUTH TWICE A DAY (Patient taking differently: Take 150 mg by mouth 2 (two) times daily. ) 180 capsule 3  . FLUoxetine (PROZAC) 40 MG capsule Take 1 capsule (40 mg total) by mouth every morning. 90 capsule 1  . furosemide (LASIX) 20 MG tablet TAKE 1-2 TABLETS (20-40 MG TOTAL) BY MOUTH DAILY. 180 tablet 1  . levothyroxine (SYNTHROID) 100 MCG tablet TAKE 1 TABLET (100 MCG  TOTAL) BY MOUTH DAILY BEFORE BREAKFAST. 90 tablet 1  . lisinopril (ZESTRIL) 10 MG tablet Take 10 mg by mouth daily.    . mirtazapine (REMERON) 30 MG tablet Take 1 tablet (30 mg total) by mouth at bedtime. 90 tablet 1  . Multiple Vitamins-Minerals (MULTIVITAMIN WITH MINERALS) tablet Take 1 tablet by mouth daily.    . Omega-3 Fatty Acids (FISH OIL) 1200 MG CAPS Take 1,200 mg by mouth daily.     Vladimir Faster Glycol-Propyl Glycol (SYSTANE OP) Place 1 drop into both eyes 2 (two) times daily as needed (dry eyes).    . progesterone (PROMETRIUM) 100 MG capsule Take 100 mg by mouth at bedtime.    . SUMAtriptan (IMITREX) 100 MG tablet TAKE 1/2-1 TABLET BY MOUTH EVERY 2 HOURS AS NEEDED FOR HEADACHE 12 tablet 5  . traMADol (ULTRAM) 50 MG tablet Take 1 tablet (50 mg total) by mouth every 6 (six) hours as needed for severe pain. For AFTER surgery, do not take and drive 10 tablet 0  . hydrochlorothiazide (MICROZIDE) 12.5 MG capsule Take 1 capsule (12.5 mg total) by mouth daily. (Patient not taking: Reported on 12/06/2019) 90 capsule 3  . senna-docusate (SENOKOT-S) 8.6-50 MG tablet Take 2 tablets by mouth at bedtime. Do not take if having diarrhea (Patient not taking: Reported  on 12/06/2019) 30 tablet 0  . enoxaparin (LOVENOX) 40 MG/0.4ML injection Inject 0.4 mLs (40 mg total) into the skin daily for 14 doses. 5.6 mL 0   No facility-administered medications prior to visit.    ROS: Review of Systems  Constitutional: Negative for activity change, appetite change, chills, fatigue and unexpected weight change.  HENT: Negative for congestion, mouth sores and sinus pressure.   Eyes: Negative for visual disturbance.  Respiratory: Negative for cough and chest tightness.   Gastrointestinal: Positive for abdominal pain. Negative for nausea.  Genitourinary: Negative for difficulty urinating, frequency and vaginal pain.  Musculoskeletal: Negative for back pain and gait problem.  Skin: Negative for pallor and rash.    Neurological: Negative for dizziness, tremors, weakness, numbness and headaches.  Psychiatric/Behavioral: Negative for confusion, dysphoric mood and sleep disturbance. The patient is nervous/anxious.     Objective:  BP (!) 122/88 (BP Location: Left Arm, Patient Position: Sitting, Cuff Size: Normal)   Pulse 75   Temp 97.6 F (36.4 C) (Oral)   Ht 5\' 2"  (1.575 m)   Wt 160 lb (72.6 kg)   SpO2 97%   BMI 29.26 kg/m   BP Readings from Last 3 Encounters:  12/06/19 (!) 122/88  11/22/19 125/77  11/09/19 122/89    Wt Readings from Last 3 Encounters:  12/06/19 160 lb (72.6 kg)  11/22/19 159 lb (72.1 kg)  11/09/19 154 lb 12.8 oz (70.2 kg)    Physical Exam Constitutional:      General: She is not in acute distress.    Appearance: She is well-developed.  HENT:     Head: Normocephalic.     Right Ear: External ear normal.     Left Ear: External ear normal.     Nose: Nose normal.  Eyes:     General:        Right eye: No discharge.        Left eye: No discharge.     Conjunctiva/sclera: Conjunctivae normal.     Pupils: Pupils are equal, round, and reactive to light.  Neck:     Thyroid: No thyromegaly.     Vascular: No JVD.     Trachea: No tracheal deviation.  Cardiovascular:     Rate and Rhythm: Normal rate and regular rhythm.     Heart sounds: Normal heart sounds.  Pulmonary:     Effort: No respiratory distress.     Breath sounds: No stridor. No wheezing.  Abdominal:     General: Bowel sounds are normal. There is no distension.     Palpations: Abdomen is soft. There is no mass.     Tenderness: There is no abdominal tenderness. There is no guarding or rebound.  Musculoskeletal:        General: No tenderness.     Cervical back: Normal range of motion and neck supple.  Lymphadenopathy:     Cervical: No cervical adenopathy.  Skin:    Findings: No erythema or rash.  Neurological:     Cranial Nerves: No cranial nerve deficit.     Motor: No abnormal muscle tone.      Coordination: Coordination normal.     Deep Tendon Reflexes: Reflexes normal.  Psychiatric:        Behavior: Behavior normal.        Thought Content: Thought content normal.        Judgment: Judgment normal.   abd w/healing scars, sensitive  Lab Results  Component Value Date   WBC 6.4 11/26/2019   HGB  11.9 (L) 11/26/2019   HCT 36.8 11/26/2019   PLT 356 11/26/2019   GLUCOSE 103 (H) 11/26/2019   CHOL 263 (H) 10/18/2018   TRIG 271.0 (H) 10/18/2018   HDL 45.40 10/18/2018   LDLDIRECT 160.0 10/18/2018   LDLCALC UNABLE TO CALCULATE IF TRIGLYCERIDE OVER 400 mg/dL 08/26/2017   ALT 18 11/26/2019   AST 20 11/26/2019   NA 142 11/26/2019   K 4.3 11/26/2019   CL 108 11/26/2019   CREATININE 1.26 (H) 11/26/2019   BUN 20 11/26/2019   CO2 24 11/26/2019   TSH 1.20 08/27/2019   INR 0.98 08/26/2017   HGBA1C 5.5 06/27/2017    DG Cystogram  Addendum Date: 11/09/2019   ADDENDUM REPORT: 11/09/2019 15:19 ADDENDUM: 200 cc of Cysto-Conray were administered. Electronically Signed   By: Abigail Miyamoto M.D.   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 Exclude bladder leak. EXAM: CYSTOGRAM TECHNIQUE: After catheterization of the urinary bladder following sterile technique the bladder was filled with Cysto-Hypaque 30% by drip infusion. Serial spot images were obtained during bladder filling and post draining. FLUOROSCOPY TIME:  Fluoroscopy Time:  1 minutes and 30 seconds Radiation Exposure Index (if provided by the fluoroscopic device): 24.7 mGy Number of Acquired Spot Images: 1 COMPARISON:  PET 10/01/2019. FINDINGS: Preprocedure scout film demonstrates surgical clips within the pelvis. Foley catheter within the bladder. Partial and complete filled images demonstrate normal bladder caliber, without contrast extravasation to suggest leak. IMPRESSION: No evidence of bladder leak. Electronically Signed: By: Abigail Miyamoto M.D. On: 11/09/2019 14:59    Assessment & Plan:   There are no diagnoses linked to this encounter.   No orders of the defined types were placed in this encounter.    Follow-up: No follow-ups on file.  Walker Kehr, MD

## 2019-12-06 NOTE — Assessment & Plan Note (Signed)
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

## 2019-12-06 NOTE — Assessment & Plan Note (Signed)
Dr Berline Lopes

## 2019-12-12 ENCOUNTER — Ambulatory Visit
Admission: RE | Admit: 2019-12-12 | Discharge: 2019-12-12 | Disposition: A | Discharge status: Home / Self Care | Payer: Medicare Other | Source: Ambulatory Visit | Attending: Radiation Oncology | Admitting: Radiation Oncology

## 2019-12-12 ENCOUNTER — Ambulatory Visit (INDEPENDENT_AMBULATORY_CARE_PROVIDER_SITE_OTHER): Payer: Medicare Other | Admitting: Psychology

## 2019-12-12 ENCOUNTER — Other Ambulatory Visit: Payer: Self-pay

## 2019-12-12 VITALS — Temp 97.6°F | Resp 18 | Wt 159.4 lb

## 2019-12-12 DIAGNOSIS — C55 Malignant neoplasm of uterus, part unspecified: Secondary | ICD-10-CM | POA: Diagnosis present

## 2019-12-12 DIAGNOSIS — F4321 Adjustment disorder with depressed mood: Secondary | ICD-10-CM

## 2019-12-12 DIAGNOSIS — R300 Dysuria: Secondary | ICD-10-CM | POA: Insufficient documentation

## 2019-12-12 DIAGNOSIS — C549 Malignant neoplasm of corpus uteri, unspecified: Secondary | ICD-10-CM

## 2019-12-12 DIAGNOSIS — R350 Frequency of micturition: Secondary | ICD-10-CM | POA: Insufficient documentation

## 2019-12-12 LAB — URINALYSIS, COMPLETE (UACMP) WITH MICROSCOPIC
Bilirubin Urine: NEGATIVE
Glucose, UA: NEGATIVE mg/dL
Hgb urine dipstick: NEGATIVE
Ketones, ur: NEGATIVE mg/dL
Nitrite: NEGATIVE
Protein, ur: NEGATIVE mg/dL
Specific Gravity, Urine: 1.006 (ref 1.005–1.030)
pH: 6 (ref 5.0–8.0)

## 2019-12-12 NOTE — Progress Notes (Signed)
Patient here to see Dr. Sondra Come after her [facility] appointment today. She reports a long hx of UTI's. Patient states she has had dysuria, urgency and frequency for 4 days. She has a h/a and fatigue that she usually experiences with a bladder infection. Patient reports she is allergic to Keflex. She reports she has often been treated with Amoxicillin or Cipro. Patient seen by Dr. Sondra Come and then sent to the lab upstairs to provide a urine specimen.

## 2019-12-13 ENCOUNTER — Other Ambulatory Visit: Payer: Self-pay | Admitting: Radiation Oncology

## 2019-12-13 MED ORDER — CIPROFLOXACIN HCL 500 MG PO TABS
500.0000 mg | ORAL_TABLET | Freq: Two times a day (BID) | ORAL | 0 refills | Status: DC
Start: 2019-12-13 — End: 2020-01-25

## 2019-12-14 ENCOUNTER — Encounter: Payer: Self-pay | Admitting: Radiation Oncology

## 2019-12-14 ENCOUNTER — Telehealth: Payer: Self-pay

## 2019-12-14 ENCOUNTER — Other Ambulatory Visit: Payer: Self-pay | Admitting: Radiation Oncology

## 2019-12-14 LAB — URINE CULTURE: Culture: >=100000 — AB

## 2019-12-14 MED ORDER — NITROFURANTOIN MONOHYD MACRO 100 MG PO CAPS
100.0000 mg | ORAL_CAPSULE | Freq: Two times a day (BID) | ORAL | 0 refills | Status: DC
Start: 2019-12-14 — End: 2020-04-14

## 2019-12-14 NOTE — Telephone Encounter (Signed)
Patient called and advised to stop her rx for Cipro per Dr. Sondra Come and that he has sent a different abx in to her pharmacy and she needs to start that new rx. Patient verbalized understanding.

## 2019-12-17 DIAGNOSIS — N189 Chronic kidney disease, unspecified: Secondary | ICD-10-CM | POA: Diagnosis not present

## 2019-12-17 DIAGNOSIS — N183 Chronic kidney disease, stage 3 unspecified: Secondary | ICD-10-CM | POA: Diagnosis not present

## 2019-12-17 DIAGNOSIS — D631 Anemia in chronic kidney disease: Secondary | ICD-10-CM | POA: Diagnosis not present

## 2019-12-17 DIAGNOSIS — N200 Calculus of kidney: Secondary | ICD-10-CM | POA: Diagnosis not present

## 2019-12-17 DIAGNOSIS — I129 Hypertensive chronic kidney disease with stage 1 through stage 4 chronic kidney disease, or unspecified chronic kidney disease: Secondary | ICD-10-CM | POA: Diagnosis not present

## 2019-12-17 DIAGNOSIS — E559 Vitamin D deficiency, unspecified: Secondary | ICD-10-CM | POA: Diagnosis not present

## 2019-12-17 NOTE — Progress Notes (Signed)
  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 The clinical setup was verified. Then port films were obtained and uploaded to the radiation oncology medical record software. The treatment beams were carefully compared against the planned radiation fields. The position location and shape of the radiation fields was reviewed. They targeted volume of tissue appears to be appropriately covered by the radiation beams. Organs at risk appear to be excluded as planned.  Based on my personal review, I approved the simulation verification. The patient's treatment will proceed as planned.   -----------------------------------  Blair Promise, PhD, MD  This document serves as a record of services personally performed by Gery Pray, MD. It was created on his behalf by Clerance Lav, a trained medical scribe. The creation of this record is based on the scribe's personal observations and the provider's statements to them. This document has been checked and approved by the attending provider.

## 2019-12-20 DIAGNOSIS — C55 Malignant neoplasm of uterus, part unspecified: Secondary | ICD-10-CM | POA: Diagnosis present

## 2019-12-20 DIAGNOSIS — R300 Dysuria: Secondary | ICD-10-CM | POA: Insufficient documentation

## 2019-12-20 DIAGNOSIS — C549 Malignant neoplasm of corpus uteri, unspecified: Secondary | ICD-10-CM | POA: Insufficient documentation

## 2019-12-20 DIAGNOSIS — R350 Frequency of micturition: Secondary | ICD-10-CM | POA: Insufficient documentation

## 2019-12-24 ENCOUNTER — Ambulatory Visit
Admission: RE | Admit: 2019-12-24 | Discharge: 2019-12-24 | Disposition: A | Discharge status: Home / Self Care | Payer: Medicare Other | Source: Ambulatory Visit | Attending: Radiation Oncology | Admitting: Radiation Oncology

## 2019-12-24 ENCOUNTER — Other Ambulatory Visit: Payer: Self-pay

## 2019-12-24 DIAGNOSIS — R300 Dysuria: Secondary | ICD-10-CM | POA: Diagnosis not present

## 2019-12-24 DIAGNOSIS — C549 Malignant neoplasm of corpus uteri, unspecified: Secondary | ICD-10-CM | POA: Diagnosis not present

## 2019-12-24 DIAGNOSIS — R350 Frequency of micturition: Secondary | ICD-10-CM | POA: Diagnosis not present

## 2019-12-25 ENCOUNTER — Other Ambulatory Visit: Payer: Self-pay

## 2019-12-25 ENCOUNTER — Ambulatory Visit
Admission: RE | Admit: 2019-12-25 | Discharge: 2019-12-25 | Disposition: A | Discharge status: Home / Self Care | Payer: Medicare Other | Source: Ambulatory Visit | Attending: Radiation Oncology | Admitting: Radiation Oncology

## 2019-12-25 ENCOUNTER — Ambulatory Visit (INDEPENDENT_AMBULATORY_CARE_PROVIDER_SITE_OTHER): Payer: Medicare Other | Admitting: Psychology

## 2019-12-25 DIAGNOSIS — C549 Malignant neoplasm of corpus uteri, unspecified: Secondary | ICD-10-CM | POA: Diagnosis not present

## 2019-12-25 DIAGNOSIS — R350 Frequency of micturition: Secondary | ICD-10-CM | POA: Diagnosis not present

## 2019-12-25 DIAGNOSIS — R300 Dysuria: Secondary | ICD-10-CM | POA: Diagnosis not present

## 2019-12-25 DIAGNOSIS — F4321 Adjustment disorder with depressed mood: Secondary | ICD-10-CM | POA: Diagnosis not present

## 2019-12-25 NOTE — Progress Notes (Signed)
Pt here for patient teaching.  Pt given Radiation and You booklet.  Reviewed areas of pertinence such as diarrhea, fatigue, nausea and vomiting, skin changes and urinary and bladder changes . Pt able to give teach back of have Imodium on hand,avoid applying anything to skin within 4 hours of treatment. Pt verbalizes understanding of information given and will contact nursing with any questions or concerns.     [email or web address]

## 2019-12-26 ENCOUNTER — Ambulatory Visit: Payer: Medicare Other

## 2019-12-27 ENCOUNTER — Other Ambulatory Visit: Payer: Self-pay

## 2019-12-27 ENCOUNTER — Ambulatory Visit
Admission: RE | Admit: 2019-12-27 | Discharge: 2019-12-27 | Disposition: A | Discharge status: Home / Self Care | Payer: Medicare Other | Source: Ambulatory Visit | Attending: Radiation Oncology | Admitting: Radiation Oncology

## 2019-12-27 DIAGNOSIS — R350 Frequency of micturition: Secondary | ICD-10-CM | POA: Diagnosis not present

## 2019-12-27 DIAGNOSIS — C549 Malignant neoplasm of corpus uteri, unspecified: Secondary | ICD-10-CM | POA: Diagnosis not present

## 2019-12-27 DIAGNOSIS — R300 Dysuria: Secondary | ICD-10-CM | POA: Diagnosis not present

## 2019-12-28 ENCOUNTER — Other Ambulatory Visit: Payer: Self-pay

## 2019-12-28 ENCOUNTER — Ambulatory Visit
Admission: RE | Admit: 2019-12-28 | Discharge: 2019-12-28 | Disposition: A | Discharge status: Home / Self Care | Payer: Medicare Other | Source: Ambulatory Visit | Attending: Radiation Oncology | Admitting: Radiation Oncology

## 2019-12-28 DIAGNOSIS — R350 Frequency of micturition: Secondary | ICD-10-CM | POA: Diagnosis not present

## 2019-12-28 DIAGNOSIS — C549 Malignant neoplasm of corpus uteri, unspecified: Secondary | ICD-10-CM | POA: Diagnosis not present

## 2019-12-28 DIAGNOSIS — R300 Dysuria: Secondary | ICD-10-CM | POA: Diagnosis not present

## 2019-12-31 ENCOUNTER — Ambulatory Visit
Admission: RE | Admit: 2019-12-31 | Discharge: 2019-12-31 | Disposition: A | Discharge status: Home / Self Care | Payer: Medicare Other | Source: Ambulatory Visit | Attending: Radiation Oncology | Admitting: Radiation Oncology

## 2019-12-31 ENCOUNTER — Other Ambulatory Visit: Payer: Self-pay

## 2019-12-31 DIAGNOSIS — R300 Dysuria: Secondary | ICD-10-CM | POA: Diagnosis not present

## 2019-12-31 DIAGNOSIS — C549 Malignant neoplasm of corpus uteri, unspecified: Secondary | ICD-10-CM | POA: Diagnosis not present

## 2019-12-31 DIAGNOSIS — R350 Frequency of micturition: Secondary | ICD-10-CM | POA: Diagnosis not present

## 2020-01-01 ENCOUNTER — Ambulatory Visit
Admission: RE | Admit: 2020-01-01 | Discharge: 2020-01-01 | Disposition: A | Discharge status: Home / Self Care | Payer: Medicare Other | Source: Ambulatory Visit | Attending: Radiation Oncology | Admitting: Radiation Oncology

## 2020-01-01 ENCOUNTER — Other Ambulatory Visit: Payer: Self-pay

## 2020-01-01 ENCOUNTER — Other Ambulatory Visit: Payer: Self-pay | Admitting: Radiation Oncology

## 2020-01-01 DIAGNOSIS — C549 Malignant neoplasm of corpus uteri, unspecified: Secondary | ICD-10-CM | POA: Diagnosis not present

## 2020-01-01 DIAGNOSIS — R300 Dysuria: Secondary | ICD-10-CM | POA: Diagnosis not present

## 2020-01-01 DIAGNOSIS — R350 Frequency of micturition: Secondary | ICD-10-CM | POA: Diagnosis not present

## 2020-01-01 LAB — URINALYSIS, COMPLETE (UACMP) WITH MICROSCOPIC
Bilirubin Urine: NEGATIVE
Glucose, UA: NEGATIVE mg/dL
Hgb urine dipstick: NEGATIVE
Ketones, ur: NEGATIVE mg/dL
Nitrite: NEGATIVE
Protein, ur: NEGATIVE mg/dL
Specific Gravity, Urine: 1.019 (ref 1.005–1.030)
WBC, UA: >50 WBC/hpf — ABNORMAL HIGH (ref 0–5)
pH: 6 (ref 5.0–8.0)

## 2020-01-02 ENCOUNTER — Ambulatory Visit
Admission: RE | Admit: 2020-01-02 | Discharge: 2020-01-02 | Disposition: A | Discharge status: Home / Self Care | Payer: Medicare Other | Source: Ambulatory Visit | Attending: Radiation Oncology | Admitting: Radiation Oncology

## 2020-01-02 ENCOUNTER — Other Ambulatory Visit: Payer: Self-pay

## 2020-01-02 DIAGNOSIS — R350 Frequency of micturition: Secondary | ICD-10-CM | POA: Diagnosis not present

## 2020-01-02 DIAGNOSIS — C549 Malignant neoplasm of corpus uteri, unspecified: Secondary | ICD-10-CM | POA: Diagnosis not present

## 2020-01-02 DIAGNOSIS — R300 Dysuria: Secondary | ICD-10-CM | POA: Diagnosis not present

## 2020-01-03 ENCOUNTER — Other Ambulatory Visit: Payer: Self-pay | Admitting: Nephrology

## 2020-01-03 ENCOUNTER — Ambulatory Visit
Admission: RE | Admit: 2020-01-03 | Discharge: 2020-01-03 | Disposition: A | Discharge status: Home / Self Care | Payer: Medicare Other | Source: Ambulatory Visit | Attending: Radiation Oncology | Admitting: Radiation Oncology

## 2020-01-03 ENCOUNTER — Other Ambulatory Visit (HOSPITAL_COMMUNITY): Payer: Self-pay | Admitting: Nephrology

## 2020-01-03 ENCOUNTER — Other Ambulatory Visit: Payer: Self-pay

## 2020-01-03 DIAGNOSIS — R300 Dysuria: Secondary | ICD-10-CM | POA: Diagnosis not present

## 2020-01-03 DIAGNOSIS — C549 Malignant neoplasm of corpus uteri, unspecified: Secondary | ICD-10-CM | POA: Diagnosis not present

## 2020-01-03 DIAGNOSIS — R350 Frequency of micturition: Secondary | ICD-10-CM | POA: Diagnosis not present

## 2020-01-04 ENCOUNTER — Other Ambulatory Visit: Payer: Self-pay

## 2020-01-04 ENCOUNTER — Ambulatory Visit
Admission: RE | Admit: 2020-01-04 | Discharge: 2020-01-04 | Disposition: A | Discharge status: Home / Self Care | Payer: Medicare Other | Source: Ambulatory Visit | Attending: Radiation Oncology | Admitting: Radiation Oncology

## 2020-01-04 ENCOUNTER — Other Ambulatory Visit: Payer: Self-pay | Admitting: Radiation Oncology

## 2020-01-04 ENCOUNTER — Telehealth: Payer: Self-pay

## 2020-01-04 DIAGNOSIS — R300 Dysuria: Secondary | ICD-10-CM | POA: Diagnosis not present

## 2020-01-04 DIAGNOSIS — C549 Malignant neoplasm of corpus uteri, unspecified: Secondary | ICD-10-CM | POA: Diagnosis not present

## 2020-01-04 DIAGNOSIS — R350 Frequency of micturition: Secondary | ICD-10-CM | POA: Diagnosis not present

## 2020-01-04 LAB — URINE CULTURE: Culture: >=100000 — AB

## 2020-01-04 MED ORDER — ONDANSETRON HCL 4 MG PO TABS: 4.0000 mg | ORAL_TABLET | Freq: Three times a day (TID) | ORAL | 0 refills | Status: DC | PRN

## 2020-01-04 MED ORDER — CIPROFLOXACIN HCL 500 MG PO TABS
500.0000 mg | ORAL_TABLET | Freq: Two times a day (BID) | ORAL | 0 refills | Status: DC
Start: 2020-01-04 — End: 2020-03-17

## 2020-01-04 NOTE — Telephone Encounter (Signed)
Patient called to check and see which abx works well for her to tx her UTI per Dr. Sondra Come. Patient prefers Cipro. She is currently in the [facility] for her radiation tx. She is having nausea and diarrhea today and is taking Immodium. She is requesting that Dr. Sondra Come send an rx in for Zofran also. Dr. Sondra Come notified and patient advised that her scripts should be sent in this afternoon by Dr. Sondra Come.

## 2020-01-07 ENCOUNTER — Other Ambulatory Visit (HOSPITAL_COMMUNITY): Payer: Self-pay | Admitting: Nephrology

## 2020-01-07 ENCOUNTER — Other Ambulatory Visit: Payer: Self-pay

## 2020-01-07 ENCOUNTER — Other Ambulatory Visit: Payer: Self-pay | Admitting: Nephrology

## 2020-01-07 ENCOUNTER — Ambulatory Visit
Admission: RE | Admit: 2020-01-07 | Discharge: 2020-01-07 | Disposition: A | Discharge status: Home / Self Care | Payer: Medicare Other | Source: Ambulatory Visit | Attending: Radiation Oncology | Admitting: Radiation Oncology

## 2020-01-07 DIAGNOSIS — R350 Frequency of micturition: Secondary | ICD-10-CM | POA: Diagnosis not present

## 2020-01-07 DIAGNOSIS — R300 Dysuria: Secondary | ICD-10-CM | POA: Diagnosis not present

## 2020-01-07 DIAGNOSIS — C549 Malignant neoplasm of corpus uteri, unspecified: Secondary | ICD-10-CM | POA: Diagnosis not present

## 2020-01-08 ENCOUNTER — Other Ambulatory Visit: Payer: Self-pay

## 2020-01-08 ENCOUNTER — Ambulatory Visit
Admission: RE | Admit: 2020-01-08 | Discharge: 2020-01-08 | Disposition: A | Discharge status: Home / Self Care | Payer: Medicare Other | Source: Ambulatory Visit | Attending: Radiation Oncology | Admitting: Radiation Oncology

## 2020-01-08 DIAGNOSIS — C549 Malignant neoplasm of corpus uteri, unspecified: Secondary | ICD-10-CM | POA: Diagnosis not present

## 2020-01-08 DIAGNOSIS — R300 Dysuria: Secondary | ICD-10-CM | POA: Diagnosis not present

## 2020-01-08 DIAGNOSIS — R350 Frequency of micturition: Secondary | ICD-10-CM | POA: Diagnosis not present

## 2020-01-09 ENCOUNTER — Ambulatory Visit
Admission: RE | Admit: 2020-01-09 | Discharge: 2020-01-09 | Disposition: A | Discharge status: Home / Self Care | Payer: Medicare Other | Source: Ambulatory Visit | Attending: Radiation Oncology | Admitting: Radiation Oncology

## 2020-01-09 ENCOUNTER — Other Ambulatory Visit: Payer: Self-pay

## 2020-01-09 ENCOUNTER — Ambulatory Visit (INDEPENDENT_AMBULATORY_CARE_PROVIDER_SITE_OTHER): Payer: Medicare Other | Admitting: Psychology

## 2020-01-09 DIAGNOSIS — F4321 Adjustment disorder with depressed mood: Secondary | ICD-10-CM

## 2020-01-09 DIAGNOSIS — C549 Malignant neoplasm of corpus uteri, unspecified: Secondary | ICD-10-CM | POA: Diagnosis not present

## 2020-01-09 DIAGNOSIS — R350 Frequency of micturition: Secondary | ICD-10-CM | POA: Diagnosis not present

## 2020-01-09 DIAGNOSIS — R300 Dysuria: Secondary | ICD-10-CM | POA: Diagnosis not present

## 2020-01-10 ENCOUNTER — Other Ambulatory Visit: Payer: Self-pay

## 2020-01-10 ENCOUNTER — Ambulatory Visit
Admission: RE | Admit: 2020-01-10 | Discharge: 2020-01-10 | Disposition: A | Discharge status: Home / Self Care | Payer: Medicare Other | Source: Ambulatory Visit | Attending: Radiation Oncology | Admitting: Radiation Oncology

## 2020-01-10 DIAGNOSIS — C549 Malignant neoplasm of corpus uteri, unspecified: Secondary | ICD-10-CM | POA: Diagnosis not present

## 2020-01-10 DIAGNOSIS — R300 Dysuria: Secondary | ICD-10-CM | POA: Diagnosis not present

## 2020-01-10 DIAGNOSIS — R350 Frequency of micturition: Secondary | ICD-10-CM | POA: Diagnosis not present

## 2020-01-11 ENCOUNTER — Ambulatory Visit: Payer: Medicare Other | Admitting: Psychology

## 2020-01-11 ENCOUNTER — Ambulatory Visit
Admission: RE | Admit: 2020-01-11 | Discharge: 2020-01-11 | Disposition: A | Discharge status: Home / Self Care | Payer: Medicare Other | Source: Ambulatory Visit | Attending: Radiation Oncology | Admitting: Radiation Oncology

## 2020-01-11 DIAGNOSIS — C549 Malignant neoplasm of corpus uteri, unspecified: Secondary | ICD-10-CM | POA: Diagnosis not present

## 2020-01-11 DIAGNOSIS — R350 Frequency of micturition: Secondary | ICD-10-CM | POA: Diagnosis not present

## 2020-01-11 DIAGNOSIS — R300 Dysuria: Secondary | ICD-10-CM | POA: Diagnosis not present

## 2020-01-13 ENCOUNTER — Other Ambulatory Visit: Payer: Self-pay | Admitting: Internal Medicine

## 2020-01-14 ENCOUNTER — Other Ambulatory Visit: Payer: Self-pay

## 2020-01-14 ENCOUNTER — Ambulatory Visit
Admission: RE | Admit: 2020-01-14 | Discharge: 2020-01-14 | Disposition: A | Discharge status: Home / Self Care | Payer: Medicare Other | Source: Ambulatory Visit | Attending: Radiation Oncology | Admitting: Radiation Oncology

## 2020-01-14 DIAGNOSIS — R350 Frequency of micturition: Secondary | ICD-10-CM | POA: Diagnosis not present

## 2020-01-14 DIAGNOSIS — R300 Dysuria: Secondary | ICD-10-CM | POA: Diagnosis not present

## 2020-01-14 DIAGNOSIS — C549 Malignant neoplasm of corpus uteri, unspecified: Secondary | ICD-10-CM | POA: Diagnosis not present

## 2020-01-15 ENCOUNTER — Telehealth: Payer: Self-pay

## 2020-01-15 ENCOUNTER — Ambulatory Visit: Payer: Medicare Other

## 2020-01-15 NOTE — Telephone Encounter (Signed)
Patient called to notify Dr. Sondra Come and RT that she has nausea,vomiting and diarrhea today and will not be coming in for her tx today. She is taking Immodium and Zofran. Patient trying to drink fluids and advised to call if her sx worsen.

## 2020-01-16 ENCOUNTER — Telehealth: Payer: Self-pay

## 2020-01-16 ENCOUNTER — Ambulatory Visit: Payer: Medicare Other

## 2020-01-16 NOTE — Telephone Encounter (Signed)
Patient called to say she is still sick with nausea and diarrhea. She hopes to come for treatment tomorrow. Dr. Sondra Come and [facility] notified.

## 2020-01-17 ENCOUNTER — Other Ambulatory Visit: Payer: Self-pay

## 2020-01-17 ENCOUNTER — Ambulatory Visit
Admission: RE | Admit: 2020-01-17 | Discharge: 2020-01-17 | Disposition: A | Discharge status: Home / Self Care | Payer: Medicare Other | Source: Ambulatory Visit | Attending: Radiation Oncology | Admitting: Radiation Oncology

## 2020-01-17 DIAGNOSIS — C549 Malignant neoplasm of corpus uteri, unspecified: Secondary | ICD-10-CM | POA: Diagnosis present

## 2020-01-17 DIAGNOSIS — Z510 Encounter for antineoplastic radiation therapy: Secondary | ICD-10-CM | POA: Insufficient documentation

## 2020-01-17 DIAGNOSIS — R300 Dysuria: Secondary | ICD-10-CM | POA: Insufficient documentation

## 2020-01-17 DIAGNOSIS — R350 Frequency of micturition: Secondary | ICD-10-CM | POA: Diagnosis not present

## 2020-01-18 ENCOUNTER — Other Ambulatory Visit: Payer: Self-pay | Admitting: Internal Medicine

## 2020-01-18 ENCOUNTER — Telehealth: Payer: Self-pay

## 2020-01-18 ENCOUNTER — Ambulatory Visit: Payer: Medicare Other

## 2020-01-18 NOTE — Telephone Encounter (Signed)
Patient called to report her GI sx have worsened and she will not be coming in for her Radiation tx today. Dr. Sondra Come and Madelaine Bhat 3 advised. Patient staying hydrated with Gatorade and advised to contact the on call MD this weekend if her sx worsen which have been nausea and diarrhea. Patient declines any needs at this time. She is scheduled for tx and to be seen on 9/7 with Dr. Sondra Come .

## 2020-01-22 ENCOUNTER — Ambulatory Visit
Admission: RE | Admit: 2020-01-22 | Discharge: 2020-01-22 | Disposition: A | Discharge status: Home / Self Care | Payer: Medicare Other | Source: Ambulatory Visit | Attending: Radiation Oncology | Admitting: Radiation Oncology

## 2020-01-22 ENCOUNTER — Other Ambulatory Visit: Payer: Self-pay

## 2020-01-22 DIAGNOSIS — Z510 Encounter for antineoplastic radiation therapy: Secondary | ICD-10-CM | POA: Diagnosis not present

## 2020-01-22 DIAGNOSIS — R350 Frequency of micturition: Secondary | ICD-10-CM | POA: Diagnosis not present

## 2020-01-22 DIAGNOSIS — R300 Dysuria: Secondary | ICD-10-CM | POA: Diagnosis not present

## 2020-01-23 ENCOUNTER — Ambulatory Visit: Payer: Medicare Other | Admitting: Psychology

## 2020-01-23 ENCOUNTER — Ambulatory Visit
Admission: RE | Admit: 2020-01-23 | Discharge: 2020-01-23 | Disposition: A | Discharge status: Home / Self Care | Payer: Medicare Other | Source: Ambulatory Visit | Attending: Radiation Oncology | Admitting: Radiation Oncology

## 2020-01-23 ENCOUNTER — Telehealth: Payer: Self-pay

## 2020-01-23 ENCOUNTER — Other Ambulatory Visit: Payer: Self-pay

## 2020-01-23 DIAGNOSIS — C549 Malignant neoplasm of corpus uteri, unspecified: Secondary | ICD-10-CM

## 2020-01-23 DIAGNOSIS — R350 Frequency of micturition: Secondary | ICD-10-CM | POA: Diagnosis not present

## 2020-01-23 DIAGNOSIS — Z510 Encounter for antineoplastic radiation therapy: Secondary | ICD-10-CM | POA: Diagnosis not present

## 2020-01-23 DIAGNOSIS — R300 Dysuria: Secondary | ICD-10-CM | POA: Diagnosis not present

## 2020-01-23 LAB — URINALYSIS, COMPLETE (UACMP) WITH MICROSCOPIC
Bilirubin Urine: NEGATIVE
Glucose, UA: NEGATIVE mg/dL
Hgb urine dipstick: NEGATIVE
Ketones, ur: NEGATIVE mg/dL
Nitrite: NEGATIVE
Protein, ur: NEGATIVE mg/dL
Specific Gravity, Urine: 1.01 (ref 1.005–1.030)
pH: 5 (ref 5.0–8.0)

## 2020-01-23 NOTE — Telephone Encounter (Signed)
Patient called and left a voice mail that she was having urinary frequency, urgency, dysuria and a h/a and is requesting to have a lb visit today after her tx for a UA. 1130am 140 pm ML on pt's VM  To go to lab after her radiation tx today. Lab appt. made for 2 pm and one of the RT's on [facility] [address] will direct the patient in a few minutes to go upstairs. Lab orders entered for UA with micro and C&S per V.O. Dr. Sondra Come.

## 2020-01-24 ENCOUNTER — Ambulatory Visit: Payer: Medicare Other

## 2020-01-24 ENCOUNTER — Telehealth: Payer: Self-pay

## 2020-01-24 ENCOUNTER — Ambulatory Visit (INDEPENDENT_AMBULATORY_CARE_PROVIDER_SITE_OTHER): Payer: Medicare Other | Admitting: Psychology

## 2020-01-24 DIAGNOSIS — F4321 Adjustment disorder with depressed mood: Secondary | ICD-10-CM | POA: Diagnosis not present

## 2020-01-24 LAB — URINE CULTURE: Culture: <10000 — AB

## 2020-01-24 NOTE — Telephone Encounter (Signed)
Patient called and advised that her urine culture reports reads insignificant growth. Advised there is nothing to treat at this time per Dr. Sondra Come. Patient states she is still feeling bad and sees her PCP 9/10 at 3pm.
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
## 2020-01-25 ENCOUNTER — Encounter: Payer: Self-pay | Admitting: Internal Medicine

## 2020-01-25 ENCOUNTER — Telehealth: Payer: Self-pay

## 2020-01-25 ENCOUNTER — Ambulatory Visit: Payer: Medicare Other

## 2020-01-25 ENCOUNTER — Telehealth (INDEPENDENT_AMBULATORY_CARE_PROVIDER_SITE_OTHER): Payer: Medicare Other | Admitting: Internal Medicine

## 2020-01-25 DIAGNOSIS — R300 Dysuria: Secondary | ICD-10-CM

## 2020-01-25 DIAGNOSIS — R197 Diarrhea, unspecified: Secondary | ICD-10-CM | POA: Diagnosis not present

## 2020-01-25 DIAGNOSIS — F329 Major depressive disorder, single episode, unspecified: Secondary | ICD-10-CM

## 2020-01-25 DIAGNOSIS — G43019 Migraine without aura, intractable, without status migrainosus: Secondary | ICD-10-CM | POA: Diagnosis not present

## 2020-01-25 NOTE — Telephone Encounter (Signed)
Patient returned RN's call to check and see how she is feeling today. Patient states she is in the bed with nausea, diarrhea and depression and will not be coming to radiation today or to see her primary MD. She has talked with her therapist today and is taking her meds and drinking plenty of water and she feels very down. Pt. Encouraged to call her primary MD to see if they can do a virtual visit today instead. Patient will let Dr. Clabe Seal office know if we can do anything. Dr. Sondra Come and [facility] [facility] advised of pt's cancellation.

## 2020-01-25 NOTE — Progress Notes (Signed)
Virtual Visit via Video Note  I connected with [name] on 01/25/20 at  3:15 PM EDT by a video enabled telemedicine application and verified that I am speaking with the correct person using two identifiers.   I discussed the limitations of evaluation and management by telemedicine and the availability of in person appointments. The patient expressed understanding and agreed to proceed.  Present for the visit:  Myself, Dr Billey Gosling, [name].  The patient is currently at home and I am in the office.    No referring provider.    History of Present Illness: She is here for an acute visit.    She was diagnosed with a leiomyosarcoma years ago and had treatment at that time.  She did have a recurrence of the cancer and is currently undergoing radiation.  A couple of times this week she has woken up in the morning and has felt so bad she did not want to go to radiation and today was 1 of those days.  She has chronic cystitis and recurrent urinary tract infections.  She has been on several antibiotics for this.  Recently she did have her urine checked and she had a very mild amount of bacteria in the urine-not enough to be started on antibiotics.  She also has a history of kidney stones.  She was told that the kidney stones are secondary to inflammation.  She also has been experiencing diarrhea.  The radiation oncologist told her this was not likely related to the radiation because she had not had enough of it.  Her biggest concern is why she has all this inflammation and if there is a specialist in information that she can be referred to.  Since the diagnosis of her cancer she has been on a vegetarian diet, is not eating any sugars and eats only whole foods.  She does not drink any caffeine.  She is doing restorative yoga.  She feels she is doing everything she can do.  This morning she woke up with a headache, diarrhea and dysuria.   Review of Systems   Constitutional: Positive for malaise/fatigue.  Gastrointestinal: Positive for diarrhea and nausea.  Genitourinary: Positive for dysuria, frequency and urgency.  Neurological: Positive for headaches.  Psychiatric/Behavioral: Positive for depression.     Social History   Socioeconomic History  . Marital status: Single    Spouse name: Not on file  . Number of children: 2  . Years of education: 28  . Highest education level: Not on file  Occupational History  . 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 Smoking status: Never Smoker  . Smokeless tobacco: Never Used  Vaping Use  . Vaping Use: Never used  Substance and Sexual Activity  . Alcohol use: Not Currently    Alcohol/week: 2.0 - 3.0 standard drinks    Types: 2 - 3 Standard drinks or equivalent per week  . Drug use: Never  . Sexual activity: Not Currently    Partners: Male  Other Topics Concern  . Not on file  Social History Narrative   Married 4 years- divorced; married 10 years- divorced. Serially monogamous relationships. Remarried March '11. 2 sons- '82, '87.    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 (June "12).    Lives in her own home.  Currently going through a divorce.   Social Determinants of Radio broadcast assistant  Strain:   . Difficulty of Paying Living Expenses: Not on file  Food Insecurity:   . Worried About Charity fundraiser in the Last Year: Not on file  . Ran Out of Food in the Last Year: Not on file  Transportation Needs:   . Lack of Transportation (Medical): Not on file  . Lack of Transportation (Non-Medical): Not on file  Physical Activity:   . Days of Exercise per Week: Not on file  . Minutes of Exercise per Session: Not on file  Stress:   . Feeling of Stress : Not on file  Social Connections:   . Frequency of Communication with Friends and Family: Not on file  . Frequency of  Social Gatherings with Friends and Family: Not on file  . Attends Religious Services: Not on file  . Active Member of Clubs or Organizations: Not on file  . Attends Archivist Meetings: Not on file  . Marital Status: Not on file     Observations/Objective: Appears well in NAD Breathing normally  Assessment and Plan:  Experiencing fatigue, dysuria, diarrhea and headaches:  Continue symptomatic treatment  Her biggest question today is if there is a specialist in inflammation that she can see to help lower her overall inflammation.  I discussed with her there is no one that specializes in inflammation and I think she is doing everything she can with her diet and restorative yoga  I do not think her symptoms are caused by whole body inflammation and should address her symptoms specifically.  Urine was tested this week and there was insignificant growth so no antibiotic needed.  She does follow with urology  Advised to take Imodium for diarrhea.  May need to have stool cultures if the diarrhea persists given all of her antibiotics.  Can also consider seeing GI  Following with Dr. Cheryln Manly and Dr. Casimiro Needle.  See Problem List for Assessment and Plan of chronic medical problems.   Follow Up Instructions:    I discussed the assessment and treatment plan with the patient. The patient was provided an opportunity to ask questions and all were answered. The patient agreed with the plan and demonstrated an understanding of the instructions.   The patient was advised to call back or seek an in-person evaluation if the symptoms worsen or if the condition fails to improve as anticipated.    [name], MD

## 2020-01-28 ENCOUNTER — Ambulatory Visit: Payer: Medicare Other | Admitting: Cardiology

## 2020-01-28 ENCOUNTER — Other Ambulatory Visit: Payer: Self-pay

## 2020-01-28 ENCOUNTER — Ambulatory Visit
Admission: RE | Admit: 2020-01-28 | Discharge: 2020-01-28 | Disposition: A | Discharge status: Home / Self Care | Payer: Medicare Other | Source: Ambulatory Visit | Attending: Radiation Oncology | Admitting: Radiation Oncology

## 2020-01-28 DIAGNOSIS — R350 Frequency of micturition: Secondary | ICD-10-CM | POA: Diagnosis not present

## 2020-01-28 DIAGNOSIS — Z510 Encounter for antineoplastic radiation therapy: Secondary | ICD-10-CM | POA: Diagnosis not present

## 2020-01-28 DIAGNOSIS — R300 Dysuria: Secondary | ICD-10-CM | POA: Diagnosis not present

## 2020-01-29 ENCOUNTER — Other Ambulatory Visit: Payer: Self-pay | Admitting: Radiation Oncology

## 2020-01-29 ENCOUNTER — Ambulatory Visit: Payer: Medicare Other

## 2020-01-29 MED ORDER — PROCHLORPERAZINE MALEATE 10 MG PO TABS: 10.0000 mg | ORAL_TABLET | Freq: Four times a day (QID) | ORAL | 0 refills | Status: DC | PRN

## 2020-01-30 ENCOUNTER — Encounter: Payer: Self-pay | Admitting: Radiation Oncology

## 2020-01-30 ENCOUNTER — Other Ambulatory Visit: Payer: Self-pay

## 2020-01-30 ENCOUNTER — Ambulatory Visit
Admission: RE | Admit: 2020-01-30 | Discharge: 2020-01-30 | Disposition: A | Discharge status: Home / Self Care | Payer: Medicare Other | Source: Ambulatory Visit | Attending: Radiation Oncology | Admitting: Radiation Oncology

## 2020-01-30 ENCOUNTER — Ambulatory Visit: Payer: Medicare Other

## 2020-01-30 DIAGNOSIS — R350 Frequency of micturition: Secondary | ICD-10-CM | POA: Diagnosis not present

## 2020-01-30 DIAGNOSIS — Z510 Encounter for antineoplastic radiation therapy: Secondary | ICD-10-CM | POA: Diagnosis not present

## 2020-01-30 DIAGNOSIS — R300 Dysuria: Secondary | ICD-10-CM | POA: Diagnosis not present

## 2020-01-31 ENCOUNTER — Ambulatory Visit: Payer: Medicare Other

## 2020-02-01 ENCOUNTER — Ambulatory Visit: Payer: Medicare Other

## 2020-02-01 ENCOUNTER — Telehealth: Payer: Self-pay

## 2020-02-01 NOTE — Telephone Encounter (Signed)
Patient called to cancel her tx today due to her fatigue and diarrhea. She will return on 02/06/2020 as scheduled.

## 2020-02-04 ENCOUNTER — Ambulatory Visit: Payer: Medicare Other

## 2020-02-04 ENCOUNTER — Telehealth: Payer: Self-pay | Admitting: Gastroenterology

## 2020-02-04 NOTE — Telephone Encounter (Signed)
Last seen 2017. Undergoing radiation treatments for bladder cancer. She is having diarrhea. She is taking up to 4 Imodium per day. No blood with stool. Afebrile. Diarrhea is watery. Urgent evacuation within 30 minutes of any food. Not as urgent with fluids. Oncologist does not think it is due to her radiation because she 'has not had enough." She chooses to skip treatments due to her diarrhea.  She is seeking help from [place].

## 2020-02-05 ENCOUNTER — Telehealth: Payer: Self-pay

## 2020-02-05 ENCOUNTER — Ambulatory Visit: Payer: Medicare Other

## 2020-02-05 ENCOUNTER — Other Ambulatory Visit: Payer: Self-pay

## 2020-02-05 DIAGNOSIS — R197 Diarrhea, unspecified: Secondary | ICD-10-CM

## 2020-02-05 NOTE — Telephone Encounter (Signed)
Patient is advised. She asks if you would order a CBC for her as well.

## 2020-02-05 NOTE — Telephone Encounter (Signed)
Patient left a VM on RN's VM that her IBS sx have kept her at home with diarrhea multiple times day and night for 6 days. She does not want to continue her tx at this time until her sx are under control. She has contacted her GI office and would like Dr. Sondra Come to call her. Dr. Sondra Come advised of this.

## 2020-02-05 NOTE — Telephone Encounter (Signed)
Please bring her in for office visit soon with me or APP. Check GI pathogen panel. Continue imodium as needed. Use Fiber supplements 1 -2 tablespoon TID with meals. May need to consider flex sig if persistent diarrhea

## 2020-02-06 ENCOUNTER — Ambulatory Visit: Payer: Medicare Other

## 2020-02-06 ENCOUNTER — Other Ambulatory Visit: Payer: Self-pay

## 2020-02-06 ENCOUNTER — Ambulatory Visit (INDEPENDENT_AMBULATORY_CARE_PROVIDER_SITE_OTHER): Payer: Medicare Other | Admitting: Psychology

## 2020-02-06 DIAGNOSIS — F4321 Adjustment disorder with depressed mood: Secondary | ICD-10-CM | POA: Diagnosis not present

## 2020-02-06 DIAGNOSIS — R197 Diarrhea, unspecified: Secondary | ICD-10-CM

## 2020-02-06 NOTE — Telephone Encounter (Signed)
Sure we can add CBC and CMP. Thanks

## 2020-02-07 ENCOUNTER — Other Ambulatory Visit: Payer: Self-pay | Admitting: Radiation Oncology

## 2020-02-07 ENCOUNTER — Ambulatory Visit: Payer: Medicare Other

## 2020-02-07 MED ORDER — DIPHENOXYLATE-ATROPINE 2.5-0.025 MG PO TABS: 2.0000 | ORAL_TABLET | Freq: Four times a day (QID) | ORAL | 1 refills | Status: DC | PRN

## 2020-02-08 ENCOUNTER — Ambulatory Visit: Payer: Medicare Other

## 2020-02-11 ENCOUNTER — Ambulatory Visit: Payer: Medicare Other

## 2020-02-11 ENCOUNTER — Ambulatory Visit: Payer: Medicare Other | Admitting: Cardiology

## 2020-02-11 ENCOUNTER — Telehealth: Payer: Self-pay | Admitting: Oncology

## 2020-02-11 NOTE — Telephone Encounter (Signed)
Aviella left a voice mail message saying that she is not feeling well with chronic diarrhea and a UTI.  She would like to cancel radiation treatments for today and tomorrow.  She would also like Dr. Sondra Come to call her.  Discussed with Barbra Sarks, RN and will notify Dr. Sondra Come.

## 2020-02-12 ENCOUNTER — Ambulatory Visit: Payer: Medicare Other

## 2020-02-12 ENCOUNTER — Ambulatory Visit (INDEPENDENT_AMBULATORY_CARE_PROVIDER_SITE_OTHER): Payer: Medicare Other | Admitting: Internal Medicine

## 2020-02-12 ENCOUNTER — Other Ambulatory Visit: Payer: Self-pay

## 2020-02-12 ENCOUNTER — Encounter: Payer: Self-pay | Admitting: Internal Medicine

## 2020-02-12 ENCOUNTER — Other Ambulatory Visit (INDEPENDENT_AMBULATORY_CARE_PROVIDER_SITE_OTHER): Payer: Medicare Other

## 2020-02-12 VITALS — BP 126/92 | HR 96 | Temp 98.3°F | Ht 62.0 in | Wt 161.0 lb

## 2020-02-12 DIAGNOSIS — R197 Diarrhea, unspecified: Secondary | ICD-10-CM

## 2020-02-12 DIAGNOSIS — N390 Urinary tract infection, site not specified: Secondary | ICD-10-CM | POA: Diagnosis not present

## 2020-02-12 DIAGNOSIS — C499 Malignant neoplasm of connective and soft tissue, unspecified: Secondary | ICD-10-CM

## 2020-02-12 DIAGNOSIS — F332 Major depressive disorder, recurrent severe without psychotic features: Secondary | ICD-10-CM | POA: Diagnosis not present

## 2020-02-12 DIAGNOSIS — E559 Vitamin D deficiency, unspecified: Secondary | ICD-10-CM

## 2020-02-12 LAB — COMPREHENSIVE METABOLIC PANEL
ALT: 24 U/L (ref 0–35)
AST: 27 U/L (ref 0–37)
Albumin: 4.3 g/dL (ref 3.5–5.2)
Alkaline Phosphatase: 126 U/L — ABNORMAL HIGH (ref 39–117)
BUN: 22 mg/dL (ref 6–23)
CO2: 27 mEq/L (ref 19–32)
Calcium: 9.8 mg/dL (ref 8.4–10.5)
Chloride: 97 mEq/L (ref 96–112)
Creatinine, Ser: 1.43 mg/dL — ABNORMAL HIGH (ref 0.40–1.20)
GFR: 36.27 mL/min — ABNORMAL LOW (ref >60.00)
Glucose, Bld: 94 mg/dL (ref 70–99)
Potassium: 3.7 mEq/L (ref 3.5–5.1)
Sodium: 136 mEq/L (ref 135–145)
Total Bilirubin: 0.4 mg/dL (ref 0.2–1.2)
Total Protein: 7.6 g/dL (ref 6.0–8.3)

## 2020-02-12 LAB — CBC WITH DIFFERENTIAL/PLATELET
Basophils Absolute: 0.1 10*3/uL (ref 0.0–0.1)
Basophils Relative: 1 % (ref 0.0–3.0)
Eosinophils Absolute: 0.2 10*3/uL (ref 0.0–0.7)
Eosinophils Relative: 1.4 % (ref 0.0–5.0)
HCT: 39 % (ref 36.0–46.0)
Hemoglobin: 13.5 g/dL (ref 12.0–15.0)
Lymphocytes Relative: 16.3 % (ref 12.0–46.0)
Lymphs Abs: 1.8 10*3/uL (ref 0.7–4.0)
MCHC: 34.7 g/dL (ref 30.0–36.0)
MCV: 88.7 fl (ref 78.0–100.0)
Monocytes Absolute: 0.9 10*3/uL (ref 0.1–1.0)
Monocytes Relative: 8.3 % (ref 3.0–12.0)
Neutro Abs: 8 10*3/uL — ABNORMAL HIGH (ref 1.4–7.7)
Neutrophils Relative %: 73 % (ref 43.0–77.0)
Platelets: 392 10*3/uL (ref 150.0–400.0)
RBC: 4.39 Mil/uL (ref 3.87–5.11)
RDW: 14.1 % (ref 11.5–15.5)
WBC: 10.9 10*3/uL — ABNORMAL HIGH (ref 4.0–10.5)

## 2020-02-12 MED ORDER — DIPHENOXYLATE-ATROPINE 2.5-0.025 MG PO TABS: 1.0000 | ORAL_TABLET | Freq: Four times a day (QID) | ORAL | 1 refills | Status: DC | PRN

## 2020-02-12 NOTE — Assessment & Plan Note (Signed)
surgery and XRT; severe diarrhea post XRT (pt had 22/30 treatments and stopped).

## 2020-02-12 NOTE — Addendum Note (Signed)
Addended by: Darlys Gales on: 02/12/2020 05:06 PM   Modules accepted: Orders

## 2020-02-12 NOTE — Addendum Note (Signed)
Addended by: Cresenciano Lick on: 02/12/2020 05:10 PM   Modules accepted: Orders

## 2020-02-12 NOTE — Assessment & Plan Note (Signed)
Vit D 

## 2020-02-12 NOTE — Progress Notes (Signed)
Subjective:  Patient ID: [name], female    DOB: 1951/04/05  Age: 69 y.o. MRN: [id number]  CC: No chief complaint on file.   HPI [name] presents for severe diarrhea post XRT (pt had 22/30 treatments and stopped).  Outpatient Medications Prior to Visit  Medication Sig Dispense Refill  . acetaminophen (TYLENOL) 325 MG tablet Take 650 mg by mouth every 6 (six) hours as needed for moderate pain.     Marland Kitchen albuterol (PROVENTIL) (2.5 MG/3ML) 0.083% nebulizer solution Take 3 mLs (2.5 mg total) by nebulization every 6 (six) hours as needed for wheezing or shortness of breath. 75 mL 0  . albuterol (VENTOLIN HFA) 108 (90 Base) MCG/ACT inhaler Inhale 1-2 puffs into the lungs every 6 (six) hours as needed for wheezing or shortness of breath. 8 g 0  . ALPRAZolam (XANAX) 0.5 MG tablet TAKE 1 TABLET BY MOUTH 2 TIMES DAILY AS NEEDED FOR ANXIETY. (Patient taking differently: Take 0.5 mg by mouth 2 (two) times daily as needed for anxiety. ) 60 tablet 3  . aspirin EC 81 MG tablet Take 81 mg by mouth daily.    . busPIRone (BUSPAR) 15 MG tablet Take 15 mg by mouth 2 (two) times daily.    . ciprofloxacin (CIPRO) 500 MG tablet Take 1 tablet (500 mg total) by mouth 2 (two) times daily. 14 tablet 0  . D-Mannose 500 MG CAPS Take 500 mg by mouth 2 (two) times daily. WITH CRANBERRY & DANDELION EXTRACT    . diphenoxylate-atropine (LOMOTIL) 2.5-0.025 MG tablet Take 2 tablets by mouth 4 (four) times daily as needed for diarrhea or loose stools. 30 tablet 1  . esomeprazole (NEXIUM) 40 MG capsule TAKE 1 CAPSULE BY MOUTH EVERY MORNING (Patient taking differently: Take 40 mg by mouth daily. ) 30 capsule 0  . estradiol (ESTRACE) 1 MG tablet Take 1 tablet (1 mg total) by mouth daily. 90 tablet 3  . FERREX 150 150 MG capsule TAKE 1 CAPSULE BY MOUTH TWICE A DAY (Patient taking differently: Take 150 mg by mouth 2 (two) times daily. ) 180 capsule 3  . FLUoxetine (PROZAC) 40 MG capsule Take 1 capsule (40 mg  total) by mouth every morning. 90 capsule 1  . furosemide (LASIX) 20 MG tablet TAKE 1-2 TABLETS (20-40 MG TOTAL) BY MOUTH DAILY. 180 tablet 1  . hydrochlorothiazide (MICROZIDE) 12.5 MG capsule Take 1 capsule (12.5 mg total) by mouth daily. (Patient not taking: Reported on 12/06/2019) 90 capsule 3  . levothyroxine (SYNTHROID) 100 MCG tablet TAKE 1 TABLET BY MOUTH DAILY BEFORE BREAKFAST. 90 tablet 1  . lisinopril (ZESTRIL) 10 MG tablet Take 10 mg by mouth daily.    . mirtazapine (REMERON) 30 MG tablet Take 1 tablet (30 mg total) by mouth at bedtime. 90 tablet 1  . Multiple Vitamins-Minerals (MULTIVITAMIN WITH MINERALS) tablet Take 1 tablet by mouth daily.    . nitrofurantoin, macrocrystal-monohydrate, (MACROBID) 100 MG capsule Take 1 capsule (100 mg total) by mouth 2 (two) times daily. 14 capsule 0  . Omega-3 Fatty Acids (FISH OIL) 1200 MG CAPS Take 1,200 mg by mouth daily.     . ondansetron (ZOFRAN) 4 MG tablet Take 1 tablet (4 mg total) by mouth every 8 (eight) hours as needed for nausea or vomiting. 20 tablet 0  . Polyethyl Glycol-Propyl Glycol (SYSTANE OP) Place 1 drop into both eyes 2 (two) times daily as needed (dry eyes).    . prochlorperazine (COMPAZINE) 10 MG tablet Take 1 tablet (10  mg total) by mouth every 6 (six) hours as needed for nausea or vomiting. 30 tablet 0  . progesterone (PROMETRIUM) 100 MG capsule Take 100 mg by mouth at bedtime.    . senna-docusate (SENOKOT-S) 8.6-50 MG tablet Take 2 tablets by mouth at bedtime. Do not take if having diarrhea (Patient not taking: Reported on 12/06/2019) 30 tablet 0  . SUMAtriptan (IMITREX) 100 MG tablet TAKE 1/2-1 TABLET BY MOUTH EVERY 2 HOURS AS NEEDED FOR HEADACHE 12 tablet 5  . traMADol (ULTRAM) 50 MG tablet Take 1 tablet (50 mg total) by mouth every 6 (six) hours as needed for severe pain. For AFTER surgery, do not take and drive 10 tablet 0  . triamterene-hydrochlorothiazide (MAXZIDE-25) 37.5-25 MG tablet TAKE 1 TABLET BY MOUTH DAILY. ANNUAL  APPT DUE IN JUNE MUST SEE PROVIDER FOR FUTURE REFILLS 90 tablet 3   No facility-administered medications prior to visit.    ROS: Review of Systems  Constitutional: Negative for activity change, appetite change, chills, fatigue and unexpected weight change.  HENT: Negative for congestion, mouth sores and sinus pressure.   Eyes: Negative for visual disturbance.  Respiratory: Negative for cough and chest tightness.   Gastrointestinal: Positive for abdominal distention and diarrhea. Negative for abdominal pain, nausea and vomiting.  Genitourinary: Negative for difficulty urinating, frequency and vaginal pain.  Musculoskeletal: Negative for back pain and gait problem.  Skin: Negative for pallor and rash.  Neurological: Negative for dizziness, tremors, weakness, numbness and headaches.  Psychiatric/Behavioral: Negative for confusion and sleep disturbance.    Objective:  BP (!) 126/92 (BP Location: Left Arm, Patient Position: Sitting, Cuff Size: Large)   Pulse 96   Temp 98.3 F (36.8 C) (Oral)   Ht 5\' 2"  (1.575 m)   Wt 161 lb (73 kg)   SpO2 96%   BMI 29.45 kg/m   BP Readings from Last 3 Encounters:  02/12/20 (!) 126/92  12/06/19 (!) 137/79  12/06/19 (!) 122/88    Wt Readings from Last 3 Encounters:  02/12/20 161 lb (73 kg)  12/12/19 159 lb 6.4 oz (72.3 kg)  12/06/19 160 lb 9.6 oz (72.8 kg)    Physical Exam Constitutional:      General: [name] is not in acute distress.    Appearance: [name] is well-developed. [name] is obese.  HENT:     Head: Normocephalic.     Right Ear: External ear normal.     Left Ear: External ear normal.     Nose: Nose normal.  Eyes:     General:        Right eye: No discharge.        Left eye: No discharge.     Conjunctiva/sclera: Conjunctivae normal.     Pupils: Pupils are equal, round, and reactive to light.  Neck:     Thyroid: No thyromegaly.     Vascular: No JVD.     Trachea: No tracheal deviation.  Cardiovascular:     Rate and Rhythm: Normal  rate and regular rhythm.     Heart sounds: Normal heart sounds.  Pulmonary:     Effort: No respiratory distress.     Breath sounds: No stridor. No wheezing.  Abdominal:     General: Bowel sounds are normal. There is no distension.     Palpations: Abdomen is soft. There is no mass.     Tenderness: There is no abdominal tenderness. There is no guarding or rebound.  Musculoskeletal:        General: No tenderness.  Cervical back: Normal range of motion and neck supple.  Lymphadenopathy:     Cervical: No cervical adenopathy.  Skin:    Findings: No erythema or rash.  Neurological:     Mental Status: [name] is oriented to person, place, and time.     Cranial Nerves: No cranial nerve deficit.     Motor: No abnormal muscle tone.     Coordination: Coordination normal.     Deep Tendon Reflexes: Reflexes normal.  Psychiatric:        Behavior: Behavior normal.        Thought Content: Thought content normal.        Judgment: Judgment normal.     Lab Results  Component Value Date   WBC 6.4 11/26/2019   HGB 11.9 (L) 11/26/2019   HCT 36.8 11/26/2019   PLT 356 11/26/2019   GLUCOSE 103 (H) 11/26/2019   CHOL 263 (H) 10/18/2018   TRIG 271.0 (H) 10/18/2018   HDL 45.40 10/18/2018   LDLDIRECT 160.0 10/18/2018   LDLCALC UNABLE TO CALCULATE IF TRIGLYCERIDE OVER 400 mg/dL 08/26/2017   ALT 18 11/26/2019   AST 20 11/26/2019   NA 142 11/26/2019   K 4.3 11/26/2019   CL 108 11/26/2019   CREATININE 1.26 (H) 11/26/2019   BUN 20 11/26/2019   CO2 24 11/26/2019   TSH 1.20 08/27/2019   INR 0.98 08/26/2017   HGBA1C 5.5 06/27/2017    No results found.  Assessment & Plan:    Walker Kehr, MD

## 2020-02-12 NOTE — Assessment & Plan Note (Signed)
A severe diarrhea post XRT (pt had 22/30 treatments and stopped). Appt w/Dr Silverio Decamp Labs Stool test  Lomotil prn

## 2020-02-12 NOTE — Assessment & Plan Note (Signed)
Mirtazipine Buspar

## 2020-02-12 NOTE — Patient Instructions (Addendum)
Youtube: [facility] talk: why all mushrooms are magic - Teressa Lower  [employer] -- Immune help blend

## 2020-02-13 ENCOUNTER — Other Ambulatory Visit: Payer: Medicare Other

## 2020-02-13 ENCOUNTER — Telehealth: Payer: Self-pay | Admitting: Gastroenterology

## 2020-02-13 ENCOUNTER — Ambulatory Visit: Payer: Medicare Other

## 2020-02-13 DIAGNOSIS — R197 Diarrhea, unspecified: Secondary | ICD-10-CM

## 2020-02-13 LAB — URINALYSIS, ROUTINE W REFLEX MICROSCOPIC
Bacteria, UA: NONE SEEN /HPF
Bilirubin Urine: NEGATIVE
Glucose, UA: NEGATIVE
Hgb urine dipstick: NEGATIVE
Hyaline Cast: NONE SEEN /LPF
Ketones, ur: NEGATIVE
Nitrite: NEGATIVE
Protein, ur: NEGATIVE
Specific Gravity, Urine: 1.007 (ref 1.001–1.03)
pH: 6.5 (ref 5.0–8.0)

## 2020-02-13 NOTE — Telephone Encounter (Signed)
Spoke with patient. She turned in the stool specemin today. She was concerned it would not be resulted in time for the appointment on 02/18/20. She saw her PCP today. There are concerns for a UTI. That is pending. Rescheduled the patient. She is still having diarrhea though not as bad. She is using Metamucil and Lomotil given to her by PCP. This has helped. She feels bloated.

## 2020-02-14 ENCOUNTER — Ambulatory Visit: Payer: Medicare Other

## 2020-02-15 ENCOUNTER — Ambulatory Visit: Payer: Medicare Other

## 2020-02-17 LAB — GI PROFILE, STOOL, PCR

## 2020-02-18 ENCOUNTER — Ambulatory Visit: Payer: Medicare Other | Admitting: Gastroenterology

## 2020-02-18 ENCOUNTER — Ambulatory Visit: Payer: Medicare Other

## 2020-02-18 ENCOUNTER — Ambulatory Visit: Payer: Medicare Other | Attending: Radiation Oncology

## 2020-02-18 ENCOUNTER — Ambulatory Visit (INDEPENDENT_AMBULATORY_CARE_PROVIDER_SITE_OTHER): Payer: Medicare Other | Admitting: Gastroenterology

## 2020-02-18 ENCOUNTER — Encounter: Payer: Self-pay | Admitting: Gastroenterology

## 2020-02-18 VITALS — BP 136/88 | HR 92 | Ht 62.0 in | Wt 161.6 lb

## 2020-02-18 DIAGNOSIS — C549 Malignant neoplasm of corpus uteri, unspecified: Secondary | ICD-10-CM | POA: Insufficient documentation

## 2020-02-18 DIAGNOSIS — R197 Diarrhea, unspecified: Secondary | ICD-10-CM | POA: Diagnosis not present

## 2020-02-18 DIAGNOSIS — R300 Dysuria: Secondary | ICD-10-CM | POA: Insufficient documentation

## 2020-02-18 DIAGNOSIS — K627 Radiation proctitis: Secondary | ICD-10-CM | POA: Diagnosis not present

## 2020-02-18 DIAGNOSIS — R350 Frequency of micturition: Secondary | ICD-10-CM | POA: Insufficient documentation

## 2020-02-18 DIAGNOSIS — Z510 Encounter for antineoplastic radiation therapy: Secondary | ICD-10-CM | POA: Insufficient documentation

## 2020-02-18 MED ORDER — DIPHENOXYLATE-ATROPINE 2.5-0.025 MG PO TABS: 1.0000 | ORAL_TABLET | Freq: Three times a day (TID) | ORAL | 1 refills | Status: DC | PRN

## 2020-02-18 NOTE — Patient Instructions (Signed)
We will send Lomotil to your pharmacy  Take Benefiber 1 tablespoon three times a day with meals  Follow up in 3 months  If you are age 70 or older, your body mass index should be between 23-30. Your Body mass index is 29.56 kg/m. If this is out of the aforementioned range listed, please consider follow up with your Primary Care Provider.  If you are age 21 or younger, your body mass index should be between 19-25. Your Body mass index is 29.56 kg/m. If this is out of the aformentioned range listed, please consider follow up with your Primary Care Provider.    I appreciate the  opportunity to care for you  Thank You   Harl Bowie , MD
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
## 2020-02-19 ENCOUNTER — Ambulatory Visit: Payer: Medicare Other

## 2020-02-20 ENCOUNTER — Telehealth: Payer: Self-pay | Admitting: *Deleted

## 2020-02-20 ENCOUNTER — Ambulatory Visit (INDEPENDENT_AMBULATORY_CARE_PROVIDER_SITE_OTHER): Payer: Medicare Other | Admitting: Psychology

## 2020-02-20 ENCOUNTER — Ambulatory Visit: Payer: Medicare Other

## 2020-02-20 DIAGNOSIS — F4321 Adjustment disorder with depressed mood: Secondary | ICD-10-CM | POA: Diagnosis not present

## 2020-02-20 NOTE — Telephone Encounter (Signed)
Patient called and stated "I wanted to leave a message for Dr Berline Lopes. I have decided to stop radiation treatments. I ended up having bad proctitis and cholecystitis. I even took a break and tried again it still is bad. I have seen both my PCP and GI doctors. I wanted to check with Dr Berline Lopes and see when I need to follow up with Dr Berline Lopes and have a PET scan done. " Explained that Dr Berline Lopes was out of the office this week and the message would be given to [facility]. Explained that someone would call her back later this week. Patient verbalized understanding

## 2020-02-21 ENCOUNTER — Ambulatory Visit: Payer: Medicare Other

## 2020-02-21 ENCOUNTER — Encounter: Payer: Self-pay | Admitting: Gastroenterology

## 2020-02-22 ENCOUNTER — Ambulatory Visit: Payer: Medicare Other

## 2020-02-25 ENCOUNTER — Ambulatory Visit: Payer: Medicare Other

## 2020-02-25 ENCOUNTER — Telehealth: Payer: Self-pay | Admitting: Gynecologic Oncology

## 2020-02-25 ENCOUNTER — Telehealth: Payer: Self-pay | Admitting: *Deleted

## 2020-02-25 NOTE — Telephone Encounter (Signed)
Patient called back today to follow up on her call from last week. Explained that [place] APP was in the OR but the message would be sent again and someone would call hr back.

## 2020-02-25 NOTE — Telephone Encounter (Signed)
Returned patient's calls from this morning, no answer. Left VM with callback number. Also left generic message about plan for follow-up 3 months after treatment with repeat imaging but that I'd be happy to see her in 3-4 weeks given her symptoms.  Valarie Cones MD

## 2020-02-25 NOTE — Telephone Encounter (Signed)
Patient returned Dr Charisse March call and scheduled an appt for the first week in November

## 2020-02-26 ENCOUNTER — Ambulatory Visit: Payer: Medicare Other

## 2020-02-27 ENCOUNTER — Ambulatory Visit: Payer: Medicare Other

## 2020-02-28 ENCOUNTER — Ambulatory Visit: Payer: Medicare Other

## 2020-02-29 ENCOUNTER — Ambulatory Visit: Payer: Medicare Other

## 2020-03-03 ENCOUNTER — Ambulatory Visit: Payer: Medicare Other

## 2020-03-04 ENCOUNTER — Ambulatory Visit: Payer: Medicare Other

## 2020-03-04 ENCOUNTER — Ambulatory Visit (INDEPENDENT_AMBULATORY_CARE_PROVIDER_SITE_OTHER): Payer: Medicare Other | Admitting: Psychology

## 2020-03-04 DIAGNOSIS — F4321 Adjustment disorder with depressed mood: Secondary | ICD-10-CM | POA: Diagnosis not present

## 2020-03-05 ENCOUNTER — Ambulatory Visit: Payer: Medicare Other

## 2020-03-06 ENCOUNTER — Ambulatory Visit: Payer: Medicare Other

## 2020-03-07 ENCOUNTER — Ambulatory Visit: Payer: Medicare Other

## 2020-03-10 ENCOUNTER — Ambulatory Visit: Payer: Medicare Other

## 2020-03-17 ENCOUNTER — Inpatient Hospital Stay: Payer: Medicare Other | Attending: Gynecologic Oncology | Admitting: Gynecologic Oncology

## 2020-03-17 ENCOUNTER — Other Ambulatory Visit: Payer: Self-pay

## 2020-03-17 ENCOUNTER — Encounter: Payer: Self-pay | Admitting: Gynecologic Oncology

## 2020-03-17 VITALS — BP 126/83 | HR 79 | Temp 97.8°F | Resp 18 | Wt 161.4 lb

## 2020-03-17 DIAGNOSIS — Z7982 Long term (current) use of aspirin: Secondary | ICD-10-CM | POA: Insufficient documentation

## 2020-03-17 DIAGNOSIS — Z90710 Acquired absence of both cervix and uterus: Secondary | ICD-10-CM | POA: Diagnosis not present

## 2020-03-17 DIAGNOSIS — N183 Chronic kidney disease, stage 3 unspecified: Secondary | ICD-10-CM | POA: Diagnosis not present

## 2020-03-17 DIAGNOSIS — K520 Gastroenteritis and colitis due to radiation: Secondary | ICD-10-CM

## 2020-03-17 DIAGNOSIS — Z923 Personal history of irradiation: Secondary | ICD-10-CM | POA: Insufficient documentation

## 2020-03-17 DIAGNOSIS — E559 Vitamin D deficiency, unspecified: Secondary | ICD-10-CM | POA: Diagnosis not present

## 2020-03-17 DIAGNOSIS — J449 Chronic obstructive pulmonary disease, unspecified: Secondary | ICD-10-CM | POA: Insufficient documentation

## 2020-03-17 DIAGNOSIS — C55 Malignant neoplasm of uterus, part unspecified: Secondary | ICD-10-CM | POA: Diagnosis not present

## 2020-03-17 DIAGNOSIS — M549 Dorsalgia, unspecified: Secondary | ICD-10-CM | POA: Insufficient documentation

## 2020-03-17 DIAGNOSIS — R110 Nausea: Secondary | ICD-10-CM | POA: Diagnosis not present

## 2020-03-17 DIAGNOSIS — Z90722 Acquired absence of ovaries, bilateral: Secondary | ICD-10-CM

## 2020-03-17 DIAGNOSIS — H9313 Tinnitus, bilateral: Secondary | ICD-10-CM | POA: Diagnosis not present

## 2020-03-17 DIAGNOSIS — Z86718 Personal history of other venous thrombosis and embolism: Secondary | ICD-10-CM | POA: Insufficient documentation

## 2020-03-17 DIAGNOSIS — I129 Hypertensive chronic kidney disease with stage 1 through stage 4 chronic kidney disease, or unspecified chronic kidney disease: Secondary | ICD-10-CM | POA: Diagnosis not present

## 2020-03-17 DIAGNOSIS — K219 Gastro-esophageal reflux disease without esophagitis: Secondary | ICD-10-CM | POA: Insufficient documentation

## 2020-03-17 DIAGNOSIS — Z9221 Personal history of antineoplastic chemotherapy: Secondary | ICD-10-CM | POA: Diagnosis not present

## 2020-03-17 DIAGNOSIS — E785 Hyperlipidemia, unspecified: Secondary | ICD-10-CM | POA: Insufficient documentation

## 2020-03-17 DIAGNOSIS — M5430 Sciatica, unspecified side: Secondary | ICD-10-CM | POA: Insufficient documentation

## 2020-03-17 DIAGNOSIS — F411 Generalized anxiety disorder: Secondary | ICD-10-CM | POA: Insufficient documentation

## 2020-03-17 DIAGNOSIS — M797 Fibromyalgia: Secondary | ICD-10-CM | POA: Diagnosis not present

## 2020-03-17 DIAGNOSIS — M1990 Unspecified osteoarthritis, unspecified site: Secondary | ICD-10-CM | POA: Insufficient documentation

## 2020-03-17 DIAGNOSIS — R5383 Other fatigue: Secondary | ICD-10-CM | POA: Diagnosis not present

## 2020-03-17 DIAGNOSIS — E039 Hypothyroidism, unspecified: Secondary | ICD-10-CM | POA: Insufficient documentation

## 2020-03-17 DIAGNOSIS — F319 Bipolar disorder, unspecified: Secondary | ICD-10-CM | POA: Insufficient documentation

## 2020-03-17 DIAGNOSIS — K580 Irritable bowel syndrome with diarrhea: Secondary | ICD-10-CM | POA: Diagnosis not present

## 2020-03-17 DIAGNOSIS — Z79899 Other long term (current) drug therapy: Secondary | ICD-10-CM | POA: Insufficient documentation

## 2020-03-17 NOTE — Patient Instructions (Signed)
Happy to hear that things have improved with your bowels. I will see you in mid-December for follow-up with a CT scan a few days before had. Please call if anything comes up sooner than your visit with me.
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
## 2020-03-18 ENCOUNTER — Telehealth: Payer: Self-pay

## 2020-03-18 ENCOUNTER — Ambulatory Visit (INDEPENDENT_AMBULATORY_CARE_PROVIDER_SITE_OTHER): Payer: Medicare Other | Admitting: Psychology

## 2020-03-18 DIAGNOSIS — F4321 Adjustment disorder with depressed mood: Secondary | ICD-10-CM

## 2020-03-18 NOTE — Telephone Encounter (Signed)
[name] would like Dr.Tucker to call her to discuss having a PET Scan done in the middle of December vs a CT scan alone as a PET can pick up smaller/early recurrence then a CT scan alone.  She would feel more comfortable with a PET being done.

## 2020-03-19 ENCOUNTER — Encounter: Payer: Self-pay | Admitting: Gynecologic Oncology

## 2020-03-20 ENCOUNTER — Telehealth: Payer: Self-pay | Admitting: Gynecologic Oncology

## 2020-03-20 DIAGNOSIS — C55 Malignant neoplasm of uterus, part unspecified: Secondary | ICD-10-CM

## 2020-03-20 NOTE — Telephone Encounter (Signed)
Returned the patient's call regarding her scan in December as it appeared she has not read my MyChart message.  She is very concerned about her disease status since she had to discontinue radiation early.  We discussed the risks and benefits of CT scan versus PET scan.  She is understanding of the reasons I am recommending a CT scan.  I feel that it is reasonable to get a PET for treatment completion evaluation.  I will cancel the CT scan and order a PET.  In the future, per NCCN recommendations, we will proceed with CT imaging when indicated.  The patient understands if there is anything concerning on CT scan that we would proceed with follow-up PET.  Jeral Pinch MD Gynecologic Oncology

## 2020-03-24 ENCOUNTER — Telehealth: Payer: Self-pay

## 2020-03-24 DIAGNOSIS — N200 Calculus of kidney: Secondary | ICD-10-CM | POA: Insufficient documentation

## 2020-03-24 DIAGNOSIS — Z8719 Personal history of other diseases of the digestive system: Secondary | ICD-10-CM | POA: Insufficient documentation

## 2020-03-24 DIAGNOSIS — A159 Respiratory tuberculosis unspecified: Secondary | ICD-10-CM | POA: Insufficient documentation

## 2020-03-24 DIAGNOSIS — J42 Unspecified chronic bronchitis: Secondary | ICD-10-CM | POA: Insufficient documentation

## 2020-03-24 DIAGNOSIS — Z9289 Personal history of other medical treatment: Secondary | ICD-10-CM | POA: Insufficient documentation

## 2020-03-24 DIAGNOSIS — F411 Generalized anxiety disorder: Secondary | ICD-10-CM | POA: Insufficient documentation

## 2020-03-24 DIAGNOSIS — A6000 Herpesviral infection of urogenital system, unspecified: Secondary | ICD-10-CM

## 2020-03-24 DIAGNOSIS — R7611 Nonspecific reaction to tuberculin skin test without active tuberculosis: Secondary | ICD-10-CM | POA: Insufficient documentation

## 2020-03-24 DIAGNOSIS — G9332 Myalgic encephalomyelitis/chronic fatigue syndrome: Secondary | ICD-10-CM

## 2020-03-24 DIAGNOSIS — N183 Chronic kidney disease, stage 3 unspecified: Secondary | ICD-10-CM | POA: Insufficient documentation

## 2020-03-24 DIAGNOSIS — Z87898 Personal history of other specified conditions: Secondary | ICD-10-CM | POA: Insufficient documentation

## 2020-03-24 DIAGNOSIS — D509 Iron deficiency anemia, unspecified: Secondary | ICD-10-CM | POA: Insufficient documentation

## 2020-03-24 DIAGNOSIS — N1831 Chronic kidney disease, stage 3a: Secondary | ICD-10-CM | POA: Insufficient documentation

## 2020-03-24 DIAGNOSIS — Z973 Presence of spectacles and contact lenses: Secondary | ICD-10-CM | POA: Insufficient documentation

## 2020-03-24 DIAGNOSIS — Z9221 Personal history of antineoplastic chemotherapy: Secondary | ICD-10-CM | POA: Insufficient documentation

## 2020-03-24 DIAGNOSIS — M1990 Unspecified osteoarthritis, unspecified site: Secondary | ICD-10-CM | POA: Insufficient documentation

## 2020-03-24 DIAGNOSIS — F329 Major depressive disorder, single episode, unspecified: Secondary | ICD-10-CM | POA: Insufficient documentation

## 2020-03-24 DIAGNOSIS — Z86718 Personal history of other venous thrombosis and embolism: Secondary | ICD-10-CM | POA: Insufficient documentation

## 2020-03-24 DIAGNOSIS — N3020 Other chronic cystitis without hematuria: Secondary | ICD-10-CM | POA: Insufficient documentation

## 2020-03-24 DIAGNOSIS — F319 Bipolar disorder, unspecified: Secondary | ICD-10-CM | POA: Insufficient documentation

## 2020-03-24 DIAGNOSIS — R5382 Chronic fatigue, unspecified: Secondary | ICD-10-CM | POA: Insufficient documentation

## 2020-03-24 DIAGNOSIS — K219 Gastro-esophageal reflux disease without esophagitis: Secondary | ICD-10-CM | POA: Insufficient documentation

## 2020-03-24 DIAGNOSIS — M8580 Other specified disorders of bone density and structure, unspecified site: Secondary | ICD-10-CM

## 2020-03-24 DIAGNOSIS — H04129 Dry eye syndrome of unspecified lacrimal gland: Secondary | ICD-10-CM | POA: Insufficient documentation

## 2020-03-24 DIAGNOSIS — Z87442 Personal history of urinary calculi: Secondary | ICD-10-CM | POA: Insufficient documentation

## 2020-03-24 DIAGNOSIS — J189 Pneumonia, unspecified organism: Secondary | ICD-10-CM | POA: Insufficient documentation

## 2020-03-24 DIAGNOSIS — Z87820 Personal history of traumatic brain injury: Secondary | ICD-10-CM | POA: Insufficient documentation

## 2020-03-24 DIAGNOSIS — R3915 Urgency of urination: Secondary | ICD-10-CM | POA: Insufficient documentation

## 2020-03-24 DIAGNOSIS — N1832 Chronic kidney disease, stage 3b: Secondary | ICD-10-CM | POA: Insufficient documentation

## 2020-03-24 DIAGNOSIS — U099 Post covid-19 condition, unspecified: Secondary | ICD-10-CM | POA: Insufficient documentation

## 2020-03-24 DIAGNOSIS — J4530 Mild persistent asthma, uncomplicated: Secondary | ICD-10-CM | POA: Insufficient documentation

## 2020-03-24 HISTORY — DX: Other specified disorders of bone density and structure, unspecified site: M85.80

## 2020-03-24 HISTORY — DX: Chronic fatigue, unspecified: R53.82

## 2020-03-24 HISTORY — DX: Herpesviral infection of urogenital system, unspecified: A60.00

## 2020-03-24 HISTORY — DX: Myalgic encephalomyelitis/chronic fatigue syndrome: G93.32

## 2020-03-24 NOTE — Telephone Encounter (Signed)
9 am Patient called after receiving an email that this patient sent to My Chart. Patient was concerned that the record reported she was a no show to her radiation appointment in October. Patient reassured Dr. Sondra Come and the RT staff knew when she did not come and why. Patient proceeded to update this RN on her sx. She continues to  Have IBS with several liquid black stools daily with urgency and is unable to leave her house. She takes Imodium and Lomotil. Her stool cultures from the GI MD were negative. She saw Dr. Berline Lopes recently and will have a PET scan on 12/13 with a f/u with Dr. Berline Lopes on 12/15. Patient states she has lost 10 lbs and has a limited diet but does stay hydrated. Patient advised will update Dr. Sondra Come and to please call if there is anything we can do. Patient wished success in her healing.
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
## 2020-03-26 ENCOUNTER — Ambulatory Visit: Payer: Medicare Other | Admitting: Cardiology

## 2020-03-31 ENCOUNTER — Ambulatory Visit: Payer: Medicare Other | Admitting: Cardiology

## 2020-04-01 ENCOUNTER — Ambulatory Visit (INDEPENDENT_AMBULATORY_CARE_PROVIDER_SITE_OTHER): Payer: Medicare Other | Admitting: Psychology

## 2020-04-01 DIAGNOSIS — F4321 Adjustment disorder with depressed mood: Secondary | ICD-10-CM | POA: Diagnosis not present
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
## 2020-04-04 ENCOUNTER — Ambulatory Visit: Payer: Medicare Other | Admitting: Cardiology

## 2020-04-14 ENCOUNTER — Other Ambulatory Visit (INDEPENDENT_AMBULATORY_CARE_PROVIDER_SITE_OTHER): Payer: Medicare Other

## 2020-04-14 ENCOUNTER — Ambulatory Visit (INDEPENDENT_AMBULATORY_CARE_PROVIDER_SITE_OTHER): Payer: Medicare Other | Admitting: Internal Medicine

## 2020-04-14 ENCOUNTER — Encounter: Payer: Self-pay | Admitting: Internal Medicine

## 2020-04-14 ENCOUNTER — Other Ambulatory Visit: Payer: Self-pay

## 2020-04-14 VITALS — BP 112/82 | HR 87 | Temp 98.1°F | Wt 158.6 lb

## 2020-04-14 DIAGNOSIS — K580 Irritable bowel syndrome with diarrhea: Secondary | ICD-10-CM | POA: Diagnosis not present

## 2020-04-14 DIAGNOSIS — C549 Malignant neoplasm of corpus uteri, unspecified: Secondary | ICD-10-CM

## 2020-04-14 DIAGNOSIS — E559 Vitamin D deficiency, unspecified: Secondary | ICD-10-CM

## 2020-04-14 DIAGNOSIS — Z23 Encounter for immunization: Secondary | ICD-10-CM | POA: Diagnosis not present

## 2020-04-14 DIAGNOSIS — I10 Essential (primary) hypertension: Secondary | ICD-10-CM

## 2020-04-14 LAB — CBC WITH DIFFERENTIAL/PLATELET
Basophils Absolute: 0.1 10*3/uL (ref 0.0–0.1)
Basophils Relative: 1.2 % (ref 0.0–3.0)
Eosinophils Absolute: 0.3 10*3/uL (ref 0.0–0.7)
Eosinophils Relative: 3.9 % (ref 0.0–5.0)
HCT: 38.1 % (ref 36.0–46.0)
Hemoglobin: 12.9 g/dL (ref 12.0–15.0)
Lymphocytes Relative: 20.3 % (ref 12.0–46.0)
Lymphs Abs: 1.5 10*3/uL (ref 0.7–4.0)
MCHC: 34 g/dL (ref 30.0–36.0)
MCV: 91.4 fl (ref 78.0–100.0)
Monocytes Absolute: 0.5 10*3/uL (ref 0.1–1.0)
Monocytes Relative: 7.1 % (ref 3.0–12.0)
Neutro Abs: 5.1 10*3/uL (ref 1.4–7.7)
Neutrophils Relative %: 67.5 % (ref 43.0–77.0)
Platelets: 482 10*3/uL — ABNORMAL HIGH (ref 150.0–400.0)
RBC: 4.16 Mil/uL (ref 3.87–5.11)
RDW: 14.5 % (ref 11.5–15.5)
WBC: 7.5 10*3/uL (ref 4.0–10.5)

## 2020-04-14 LAB — URINALYSIS, ROUTINE W REFLEX MICROSCOPIC
Bilirubin Urine: NEGATIVE
Ketones, ur: NEGATIVE
Nitrite: NEGATIVE
Specific Gravity, Urine: 1.015 (ref 1.000–1.030)
Total Protein, Urine: NEGATIVE
Urine Glucose: NEGATIVE
Urobilinogen, UA: 0.2 (ref 0.0–1.0)
pH: 6 (ref 5.0–8.0)

## 2020-04-14 LAB — COMPREHENSIVE METABOLIC PANEL
ALT: 18 U/L (ref 0–35)
AST: 22 U/L (ref 0–37)
Albumin: 4.3 g/dL (ref 3.5–5.2)
Alkaline Phosphatase: 105 U/L (ref 39–117)
BUN: 28 mg/dL — ABNORMAL HIGH (ref 6–23)
CO2: 25 mEq/L (ref 19–32)
Calcium: 9.8 mg/dL (ref 8.4–10.5)
Chloride: 100 mEq/L (ref 96–112)
Creatinine, Ser: 1.84 mg/dL — ABNORMAL HIGH (ref 0.40–1.20)
GFR: 27.51 mL/min — ABNORMAL LOW (ref >60.00)
Glucose, Bld: 91 mg/dL (ref 70–99)
Potassium: 3.7 mEq/L (ref 3.5–5.1)
Sodium: 136 mEq/L (ref 135–145)
Total Bilirubin: 0.5 mg/dL (ref 0.2–1.2)
Total Protein: 7.6 g/dL (ref 6.0–8.3)

## 2020-04-14 LAB — TSH: TSH: 1.26 u[IU]/mL (ref 0.35–4.50)

## 2020-04-14 MED ORDER — COLESEVELAM HCL 625 MG PO TABS: 1250.0000 mg | ORAL_TABLET | Freq: Two times a day (BID) | ORAL | 11 refills | Status: DC

## 2020-04-14 MED ORDER — LISINOPRIL 20 MG PO TABS
10.0000 mg | ORAL_TABLET | Freq: Every day | ORAL | 3 refills | Status: DC
Start: 2020-04-14 — End: 2020-07-21

## 2020-04-14 NOTE — Assessment & Plan Note (Signed)
Fatigue: reduce BP meds - see orders

## 2020-04-14 NOTE — Assessment & Plan Note (Addendum)
IBS-D Try Welchol Lomotil

## 2020-04-14 NOTE — Assessment & Plan Note (Signed)
Vit D 

## 2020-04-14 NOTE — Progress Notes (Signed)
Subjective:  Patient ID: [name], female    DOB: 1951-04-01  Age: 69 y.o. MRN: [id number]  CC: Follow-up (2 month f/u- Flu shot)   HPI [name] presents for fatigue, IBS-d, HTN f/u  Outpatient Medications Prior to Visit  Medication Sig Dispense Refill  . acetaminophen (TYLENOL) 325 MG tablet Take 650 mg by mouth every 6 (six) hours as needed for moderate pain.     Marland Kitchen albuterol (PROVENTIL) (2.5 MG/3ML) 0.083% nebulizer solution Take 3 mLs (2.5 mg total) by nebulization every 6 (six) hours as needed for wheezing or shortness of breath. 75 mL 0  . albuterol (VENTOLIN HFA) 108 (90 Base) MCG/ACT inhaler Inhale 1-2 puffs into the lungs every 6 (six) hours as needed for wheezing or shortness of breath. 8 g 0  . ALPRAZolam (XANAX) 0.5 MG tablet TAKE 1 TABLET BY MOUTH 2 TIMES DAILY AS NEEDED FOR ANXIETY. (Patient taking differently: Take 0.5 mg by mouth 2 (two) times daily as needed for anxiety. ) 60 tablet 3  . amLODipine (NORVASC) 10 MG tablet amlodipine 10 mg tablet  TAKE 1 TABLET BY MOUTH EVERY DAY    . aspirin EC 81 MG tablet Take 81 mg by mouth daily.    . busPIRone (BUSPAR) 30 MG tablet Take 30 mg by mouth 2 (two) times daily.    . carvedilol (COREG) 12.5 MG tablet carvedilol 12.5 mg tablet  TAKE 1 TABLET (12.5 MG TOTAL) BY MOUTH 2 (TWO) TIMES DAILY WITH A MEAL.    Marland Kitchen D-Mannose 500 MG CAPS Take 500 mg by mouth 2 (two) times daily. WITH CRANBERRY & DANDELION EXTRACT    . esomeprazole (NEXIUM) 40 MG capsule TAKE 1 CAPSULE BY MOUTH EVERY MORNING (Patient taking differently: Take 40 mg by mouth daily. ) 30 capsule 0  . estradiol (ESTRACE) 1 MG tablet Take 1 tablet (1 mg total) by mouth daily. 90 tablet 3  . FERREX 150 150 MG capsule TAKE 1 CAPSULE BY MOUTH TWICE A DAY (Patient taking differently: Take 150 mg by mouth 2 (two) times daily. ) 180 capsule 3  . FLUoxetine (PROZAC) 40 MG capsule Take 1 capsule (40 mg total) by mouth every morning. 90 capsule 1  . furosemide  (LASIX) 20 MG tablet TAKE 1-2 TABLETS (20-40 MG TOTAL) BY MOUTH DAILY. 180 tablet 1  . ipratropium-albuterol (DUONEB) 0.5-2.5 (3) MG/3ML SOLN ipratropium 0.5 mg-albuterol 3 mg (2.5 mg base)/3 mL nebulization soln  USE 1 VIAL BY NEBULIZATION EVERY 6 HOURS AS NEEDED    . levothyroxine (SYNTHROID) 100 MCG tablet TAKE 1 TABLET BY MOUTH DAILY BEFORE BREAKFAST. 90 tablet 1  . lisinopril (ZESTRIL) 20 MG tablet Take 1 tablet by mouth daily.    . mirtazapine (REMERON) 30 MG tablet Take 1 tablet (30 mg total) by mouth at bedtime. 90 tablet 1  . Multiple Vitamins-Minerals (MULTIVITAMIN WITH MINERALS) tablet Take 1 tablet by mouth daily.    . Omega-3 Fatty Acids (FISH OIL) 1200 MG CAPS Take 1,200 mg by mouth daily.     . ondansetron (ZOFRAN) 4 MG tablet Take 1 tablet (4 mg total) by mouth every 8 (eight) hours as needed for nausea or vomiting. 20 tablet 0  . Polyethyl Glycol-Propyl Glycol (SYSTANE OP) Place 1 drop into both eyes 2 (two) times daily as needed (dry eyes).     . prochlorperazine (COMPAZINE) 10 MG tablet Take 1 tablet (10 mg total) by mouth every 6 (six) hours as needed for nausea or vomiting. 30 tablet 0  .  progesterone (PROMETRIUM) 100 MG capsule Take 100 mg by mouth at bedtime.    . SUMAtriptan (IMITREX) 100 MG tablet TAKE 1/2-1 TABLET BY MOUTH EVERY 2 HOURS AS NEEDED FOR HEADACHE 12 tablet 5  . triamterene-hydrochlorothiazide (MAXZIDE-25) 37.5-25 MG tablet TAKE 1 TABLET BY MOUTH DAILY. ANNUAL APPT DUE IN JUNE MUST SEE PROVIDER FOR FUTURE REFILLS 90 tablet 3  . diphenoxylate-atropine (LOMOTIL) 2.5-0.025 MG tablet Take 1-2 tablets by mouth 4 (four) times daily as needed for diarrhea or loose stools. (Patient not taking: Reported on 04/14/2020) 60 tablet 1  . diphenoxylate-atropine (LOMOTIL) 2.5-0.025 MG tablet Take 1 tablet by mouth every 8 (eight) hours as needed for diarrhea or loose stools. (Patient not taking: Reported on 04/14/2020) 120 tablet 1  . lisinopril (ZESTRIL) 10 MG tablet Take 10 mg  by mouth daily. (Patient not taking: Reported on 04/14/2020)    . montelukast (SINGULAIR) 10 MG tablet Take 1 tablet by mouth daily. (Patient not taking: Reported on 04/14/2020)    . nitrofurantoin, macrocrystal-monohydrate, (MACROBID) 100 MG capsule Take 1 capsule (100 mg total) by mouth 2 (two) times daily. (Patient not taking: Reported on 03/17/2020) 14 capsule 0  . progesterone (PROMETRIUM) 100 MG capsule Take 100 mg by mouth at bedtime.    . senna-docusate (SENOKOT-S) 8.6-50 MG tablet Take 2 tablets by mouth at bedtime. Do not take if having diarrhea (Patient not taking: Reported on 04/14/2020) 30 tablet 0  . traMADol (ULTRAM) 50 MG tablet Take 1 tablet (50 mg total) by mouth every 6 (six) hours as needed for severe pain. For AFTER surgery, do not take and drive (Patient not taking: Reported on 04/14/2020) 10 tablet 0   No facility-administered medications prior to visit.    ROS: Review of Systems  Constitutional: Positive for fatigue. Negative for activity change, appetite change, chills and unexpected weight change.  HENT: Negative for congestion, mouth sores and sinus pressure.   Eyes: Negative for visual disturbance.  Respiratory: Negative for cough and chest tightness.   Gastrointestinal: Positive for abdominal distention and diarrhea. Negative for abdominal pain and nausea.  Genitourinary: Negative for difficulty urinating, frequency and vaginal pain.  Musculoskeletal: Negative for back pain and gait problem.  Skin: Negative for pallor and rash.  Neurological: Negative for dizziness, tremors, weakness, numbness and headaches.  Psychiatric/Behavioral: Positive for sleep disturbance. Negative for confusion. The patient is nervous/anxious.     Objective:  BP 112/82 (BP Location: Left Arm)   Pulse 87   Temp 98.1 F (36.7 C) (Oral)   Wt 158 lb 9.6 oz (71.9 kg)   SpO2 97%   BMI 29.01 kg/m   BP Readings from Last 3 Encounters:  04/14/20 112/82  03/17/20 126/83  02/18/20 136/88     Wt Readings from Last 3 Encounters:  04/14/20 158 lb 9.6 oz (71.9 kg)  03/17/20 161 lb 6.4 oz (73.2 kg)  02/18/20 161 lb 9.6 oz (73.3 kg)    Physical Exam Constitutional:      General: She is not in acute distress.    Appearance: She is well-developed.  HENT:     Head: Normocephalic.     Right Ear: External ear normal.     Left Ear: External ear normal.     Nose: Nose normal.  Eyes:     General:        Right eye: No discharge.        Left eye: No discharge.     Conjunctiva/sclera: Conjunctivae normal.     Pupils: Pupils are equal,  round, and reactive to light.  Neck:     Thyroid: No thyromegaly.     Vascular: No JVD.     Trachea: No tracheal deviation.  Cardiovascular:     Rate and Rhythm: Normal rate and regular rhythm.     Heart sounds: Normal heart sounds.  Pulmonary:     Effort: No respiratory distress.     Breath sounds: No stridor. No wheezing.  Abdominal:     General: Bowel sounds are normal. There is no distension.     Palpations: Abdomen is soft. There is no mass.     Tenderness: There is no abdominal tenderness. There is no guarding or rebound.  Musculoskeletal:        General: No tenderness.     Cervical back: Normal range of motion and neck supple.  Lymphadenopathy:     Cervical: No cervical adenopathy.  Skin:    Findings: No erythema or rash.  Neurological:     Mental Status: She is oriented to person, place, and time.     Cranial Nerves: No cranial nerve deficit.     Motor: No abnormal muscle tone.     Coordination: Coordination normal.     Deep Tendon Reflexes: Reflexes normal.  Psychiatric:        Behavior: Behavior normal.        Thought Content: Thought content normal.        Judgment: Judgment normal.     Lab Results  Component Value Date   WBC 10.9 (H) 02/12/2020   HGB 13.5 02/12/2020   HCT 39.0 02/12/2020   PLT 392.0 02/12/2020   GLUCOSE 94 02/12/2020   CHOL 263 (H) 10/18/2018   TRIG 271.0 (H) 10/18/2018   HDL 45.40 10/18/2018    LDLDIRECT 160.0 10/18/2018   LDLCALC UNABLE TO CALCULATE IF TRIGLYCERIDE OVER 400 mg/dL 08/26/2017   ALT 24 02/12/2020   AST 27 02/12/2020   NA 136 02/12/2020   K 3.7 02/12/2020   CL 97 02/12/2020   CREATININE 1.43 (H) 02/12/2020   BUN 22 02/12/2020   CO2 27 02/12/2020   TSH 1.20 08/27/2019   INR 0.98 08/26/2017   HGBA1C 5.5 06/27/2017    No results found.  Assessment & Plan:        Walker Kehr, MD

## 2020-04-14 NOTE — Assessment & Plan Note (Signed)
F/u w/Onc

## 2020-04-15 ENCOUNTER — Ambulatory Visit (INDEPENDENT_AMBULATORY_CARE_PROVIDER_SITE_OTHER): Payer: Medicare Other | Admitting: Psychology

## 2020-04-15 ENCOUNTER — Other Ambulatory Visit: Payer: Self-pay | Admitting: Internal Medicine

## 2020-04-15 DIAGNOSIS — F4321 Adjustment disorder with depressed mood: Secondary | ICD-10-CM | POA: Diagnosis not present

## 2020-04-15 MED ORDER — CEFUROXIME AXETIL 250 MG PO TABS: 250.0000 mg | ORAL_TABLET | Freq: Two times a day (BID) | ORAL | 0 refills | Status: DC

## 2020-04-23 NOTE — Progress Notes (Signed)
  Patient Name: [name] MRN: [id number] DOB: April 21, 1951 Referring Physician: Georgiann Mccoy (Profile Not Attached) Date of Service: 01/30/2020 [facility], [place]                                                        End Of Treatment Note  Diagnoses: C55-Malignant neoplasm of uterus, part unspecified  Cancer Staging: Recurrent leiomyosarcoma  Intent: Curative  Radiation Treatment Dates: 12/24/2019 through 01/30/2020 Site Technique Total Dose (Gy) Dose per Fx (Gy) Completed Fx Beam Energies  Uterus: Uterus IMRT 36/45 1.8 20/25 6X   Narrative: The patient tolerated radiation therapy poorly even though her pelvic radiation field was relatively small, likely related to her prior history of IBS. She reported fatigue, significant diarrhea, dysuria, urinary urgency, headache, and ongoing nausea that was not relieved by Zofran. Compazine was prescribed. She was also prescribed Cipro for UTI. She denied vomiting, hematuria, rectal bleeding, skin changes, pelvic pain, and vaginal bleeding. Unfortunately, given the ongoing diarrhea, the patient opted to discontinue treatment.  Plan: Routine follow-up in 1 month with Gyn/Onc or radiation oncology.    ________________________________________________   Blair Promise, PhD, MD  This document serves as a record of services personally performed by Gery Pray, MD. It was created on his behalf by Clerance Lav, a trained medical scribe. The creation of this record is based on the scribe's personal observations and the provider's statements to them. This document has been checked and approved by the attending provider.

## 2020-04-25 ENCOUNTER — Other Ambulatory Visit: Payer: Medicare Other

## 2020-04-28 ENCOUNTER — Ambulatory Visit (HOSPITAL_COMMUNITY): Payer: Medicare Other

## 2020-04-28 ENCOUNTER — Ambulatory Visit (HOSPITAL_COMMUNITY)
Admission: RE | Admit: 2020-04-28 | Discharge: 2020-04-28 | Disposition: A | Discharge status: Home / Self Care | Payer: Medicare Other | Source: Ambulatory Visit | Attending: Gynecologic Oncology | Admitting: Gynecologic Oncology

## 2020-04-28 ENCOUNTER — Other Ambulatory Visit: Payer: Self-pay

## 2020-04-28 DIAGNOSIS — C55 Malignant neoplasm of uterus, part unspecified: Secondary | ICD-10-CM | POA: Diagnosis present

## 2020-04-28 DIAGNOSIS — J8410 Pulmonary fibrosis, unspecified: Secondary | ICD-10-CM | POA: Diagnosis not present

## 2020-04-28 DIAGNOSIS — I700 Atherosclerosis of aorta: Secondary | ICD-10-CM | POA: Insufficient documentation

## 2020-04-28 DIAGNOSIS — R911 Solitary pulmonary nodule: Secondary | ICD-10-CM | POA: Insufficient documentation

## 2020-04-28 LAB — GLUCOSE, CAPILLARY: Glucose-Capillary: 99 mg/dL (ref 70–99)

## 2020-04-28 MED ORDER — FLUDEOXYGLUCOSE F - 18 (FDG) INJECTION
7.8400 | Freq: Once | INTRAVENOUS | Status: AC
  Administered 2020-04-28: 7.84 via INTRAVENOUS

## 2020-04-29 ENCOUNTER — Ambulatory Visit (INDEPENDENT_AMBULATORY_CARE_PROVIDER_SITE_OTHER): Payer: Medicare Other | Admitting: Psychology

## 2020-04-29 ENCOUNTER — Telehealth: Payer: Self-pay | Admitting: *Deleted

## 2020-04-29 DIAGNOSIS — F4321 Adjustment disorder with depressed mood: Secondary | ICD-10-CM

## 2020-04-29 NOTE — Telephone Encounter (Signed)
Called and moved the patient's appt from tomorrow to next Monday

## 2020-04-30 ENCOUNTER — Ambulatory Visit: Payer: Medicare Other

## 2020-04-30 ENCOUNTER — Inpatient Hospital Stay: Payer: Medicare Other | Admitting: Gynecologic Oncology

## 2020-05-01 ENCOUNTER — Other Ambulatory Visit: Payer: Self-pay | Admitting: *Deleted

## 2020-05-01 ENCOUNTER — Encounter: Payer: Self-pay | Admitting: Oncology

## 2020-05-01 ENCOUNTER — Telehealth: Payer: Self-pay | Admitting: Oncology

## 2020-05-01 ENCOUNTER — Encounter: Payer: Self-pay | Admitting: *Deleted

## 2020-05-01 DIAGNOSIS — R911 Solitary pulmonary nodule: Secondary | ICD-10-CM

## 2020-05-01 NOTE — Progress Notes (Signed)
The proposed treatment discussed in cancer conference 05/01/20 is for discussion purpose only and is not a binding recommendation.  The patient was not physically examined nor present for their treatment options.  Therefore, final treatment plans cannot be decided.

## 2020-05-01 NOTE — Telephone Encounter (Signed)
[facility] and discussed that a referral has been placed for cardiothoracic surgery to see Dr. Roxan Hockey per the thoracic conference recommendations from this morning. Advised her that they will be calling her with an appointment.  She verbalized understanding and agreement.

## 2020-05-02 ENCOUNTER — Telehealth: Payer: Self-pay | Admitting: *Deleted

## 2020-05-02 NOTE — Telephone Encounter (Signed)
Called the patient and canceled appt for Monday. Per Dr Berline Lopes ok to cancel.

## 2020-05-05 ENCOUNTER — Inpatient Hospital Stay: Payer: Medicare Other | Admitting: Gynecologic Oncology

## 2020-05-05 ENCOUNTER — Telehealth: Payer: Self-pay | Admitting: Internal Medicine

## 2020-05-05 ENCOUNTER — Other Ambulatory Visit: Payer: Medicare Other

## 2020-05-05 DIAGNOSIS — N390 Urinary tract infection, site not specified: Secondary | ICD-10-CM

## 2020-05-05 DIAGNOSIS — C55 Malignant neoplasm of uterus, part unspecified: Secondary | ICD-10-CM

## 2020-05-05 LAB — BASIC METABOLIC PANEL
BUN: 20 mg/dL (ref 6–23)
CO2: 27 mEq/L (ref 19–32)
Calcium: 10.2 mg/dL (ref 8.4–10.5)
Chloride: 94 mEq/L — ABNORMAL LOW (ref 96–112)
Creatinine, Ser: 1.47 mg/dL — ABNORMAL HIGH (ref 0.40–1.20)
GFR: 36 mL/min — ABNORMAL LOW (ref >60.00)
Glucose, Bld: 83 mg/dL (ref 70–99)
Potassium: 4.2 mEq/L (ref 3.5–5.1)
Sodium: 129 mEq/L — ABNORMAL LOW (ref 135–145)

## 2020-05-05 NOTE — Telephone Encounter (Signed)
Please get urine culture if abnormal UA.  Thanks

## 2020-05-05 NOTE — Telephone Encounter (Signed)
Called pt there was no answer LMOM w/MD response. Inform can have UA done at [facility].Marland KitchenJohny Chess

## 2020-05-05 NOTE — Telephone Encounter (Signed)
    Patient calling to request order for urine test to r/o UTI Previous order (11/29) already in system Does patient need additional orders?   Please advise

## 2020-05-05 NOTE — Addendum Note (Signed)
Addended by: Adah Salvage F on: 05/05/2020 03:31 PM   Modules accepted: Orders

## 2020-05-06 ENCOUNTER — Telehealth: Payer: Self-pay

## 2020-05-06 NOTE — Telephone Encounter (Signed)
Told Ms [name] that her sodium level is low at 129. Pt states that she is on a low sodium diet. She also drinks a lot of water which can lower her sodium as well. She will drink Gatorade when it is low as well. Told her that she also can get sodium from a can of soup. Pt verbalized understanding.

## 2020-05-07 ENCOUNTER — Ambulatory Visit: Payer: Medicare Other

## 2020-05-07 ENCOUNTER — Telehealth: Payer: Self-pay

## 2020-05-07 NOTE — Telephone Encounter (Signed)
Pt has Medicare, calling to inquire if pt checked to see if immunization covered for administration in doctor's office vs local pharmacy.

## 2020-05-08 ENCOUNTER — Telehealth: Payer: Self-pay | Admitting: Internal Medicine

## 2020-05-08 NOTE — Telephone Encounter (Signed)
Copied from [place] [id number]. Topic: Medicare AWV >> May 08, 2020  2:26 PM Cher Nakai R wrote: Reason for CRM:  Left message for patient to call back and schedule Medicare Annual Wellness Visit (AWV) in office.   If not able to come in office, please offer to do virtually.   45 minute appointment  Last AWV 06/06/2018  Please schedule at anytime with the Nurse Health Advisor.

## 2020-05-13 ENCOUNTER — Ambulatory Visit (INDEPENDENT_AMBULATORY_CARE_PROVIDER_SITE_OTHER): Payer: Medicare Other | Admitting: Psychology

## 2020-05-13 DIAGNOSIS — F4321 Adjustment disorder with depressed mood: Secondary | ICD-10-CM

## 2020-05-15 ENCOUNTER — Institutional Professional Consult (permissible substitution) (INDEPENDENT_AMBULATORY_CARE_PROVIDER_SITE_OTHER): Payer: Medicare Other | Admitting: Thoracic Surgery (Cardiothoracic Vascular Surgery)

## 2020-05-15 ENCOUNTER — Encounter: Payer: Self-pay | Admitting: Thoracic Surgery (Cardiothoracic Vascular Surgery)

## 2020-05-15 ENCOUNTER — Other Ambulatory Visit: Payer: Self-pay

## 2020-05-15 VITALS — BP 175/97 | HR 89 | Resp 20 | Ht 62.0 in | Wt 159.0 lb

## 2020-05-15 DIAGNOSIS — R918 Other nonspecific abnormal finding of lung field: Secondary | ICD-10-CM
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
## 2020-05-19 ENCOUNTER — Other Ambulatory Visit: Payer: Self-pay | Admitting: *Deleted

## 2020-05-20 ENCOUNTER — Other Ambulatory Visit: Payer: Self-pay | Admitting: *Deleted

## 2020-05-20 DIAGNOSIS — Z01818 Encounter for other preprocedural examination: Secondary | ICD-10-CM

## 2020-05-20 DIAGNOSIS — R911 Solitary pulmonary nodule: Secondary | ICD-10-CM

## 2020-05-21 ENCOUNTER — Other Ambulatory Visit: Payer: Self-pay

## 2020-05-21 ENCOUNTER — Ambulatory Visit (INDEPENDENT_AMBULATORY_CARE_PROVIDER_SITE_OTHER): Payer: Medicare Other | Admitting: Cardiology

## 2020-05-21 ENCOUNTER — Encounter: Payer: Self-pay | Admitting: Cardiology

## 2020-05-21 VITALS — BP 156/110 | HR 82 | Ht 62.0 in | Wt 162.1 lb

## 2020-05-21 DIAGNOSIS — I10 Essential (primary) hypertension: Secondary | ICD-10-CM | POA: Diagnosis not present

## 2020-05-21 DIAGNOSIS — N183 Chronic kidney disease, stage 3 unspecified: Secondary | ICD-10-CM | POA: Diagnosis not present

## 2020-05-21 NOTE — Patient Instructions (Signed)
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
## 2020-05-23 ENCOUNTER — Ambulatory Visit
Admission: RE | Admit: 2020-05-23 | Discharge: 2020-05-23 | Disposition: A | Discharge status: Home / Self Care | Payer: Medicare Other | Source: Ambulatory Visit | Attending: Thoracic Surgery (Cardiothoracic Vascular Surgery) | Admitting: Thoracic Surgery (Cardiothoracic Vascular Surgery)

## 2020-05-23 DIAGNOSIS — Z9049 Acquired absence of other specified parts of digestive tract: Secondary | ICD-10-CM | POA: Diagnosis not present

## 2020-05-23 DIAGNOSIS — Z01818 Encounter for other preprocedural examination: Secondary | ICD-10-CM | POA: Diagnosis not present

## 2020-05-23 DIAGNOSIS — R911 Solitary pulmonary nodule: Secondary | ICD-10-CM

## 2020-05-23 DIAGNOSIS — I700 Atherosclerosis of aorta: Secondary | ICD-10-CM | POA: Diagnosis not present

## 2020-05-26 ENCOUNTER — Ambulatory Visit: Payer: Medicare Other | Admitting: Thoracic Surgery (Cardiothoracic Vascular Surgery)

## 2020-05-26 ENCOUNTER — Encounter: Payer: Self-pay | Admitting: *Deleted

## 2020-05-26 ENCOUNTER — Other Ambulatory Visit: Payer: Self-pay | Admitting: *Deleted

## 2020-05-26 ENCOUNTER — Encounter: Payer: Self-pay | Admitting: Thoracic Surgery (Cardiothoracic Vascular Surgery)

## 2020-05-26 ENCOUNTER — Other Ambulatory Visit: Payer: Self-pay

## 2020-05-26 VITALS — BP 111/74 | HR 94 | Resp 20 | Ht 62.0 in

## 2020-05-26 DIAGNOSIS — R911 Solitary pulmonary nodule: Secondary | ICD-10-CM

## 2020-05-26 NOTE — H&P (View-Only) (Signed)
      [address]       [place] [address]             [phone]     HPI: [name] returns to discuss work-up and management of her right upper lobe lung nodule.  [name] is a 70 year old non-smoker with a past medical history significant for leiomyosarcoma of the vagina in 2014, hypertension, asthma, chronic bronchitis, positive PPD, stage III chronic kidney disease, anxiety, and depression.  She was diagnosed with leiomyosarcoma in 2014.  She had resection and that was treated with adjuvant chemotherapy.  This spring she had a recurrence.  She had a reresection and then was treated with radiation but was unable to complete her course.  She been followed with PET CTs.  She has had an old granuloma in the right upper lobe.  Over the past couple of PET CTs there is been a progressive increase in soft tissue associated with that calcified granuloma.  On most recent PET/CT there was some metabolic activity associated with that.  I had seen her on 05/15/2020.  We were planning to do a navigational bronchoscopy to mark the nodule followed by a robotic wedge resection for definitive diagnosis and treatment.  She was very adamant about wanting a resection at that visit.  After having some time to think about it she was interested to see if there was a completely bronchoscopic approach to managing the nodule.  She now returns to discuss her options.   We again reviewed the images.  This nodule cannot be resected bronchoscopically.  We discussed the possibility of doing a diagnostic navigational bronchoscopy to sample the nodule.  She understands the possible outcomes include definitive diagnosis of cancer, diagnosis of granuloma, or an indeterminate answer.  She understands the difficulty in trying to prove a negative.  She understands this would be strictly diagnostic and not therapeutic.  After our discussion she wishes to proceed with navigational bronchoscopy  for diagnostic purposes.  She understands the indications, risks, benefits, and alternatives.  She understands we will do this with general anesthesia.  We should be able to do it on an outpatient basis.  Electromagnetic navigational bronchoscopy on Friday, 06/06/2020  I spent over 10 minutes in review of records, images, and in discussion with [name] today.  Melrose Nakayama, MD [facility] [phone]

## 2020-05-26 NOTE — Progress Notes (Signed)
      [address]       [place] [address]             [phone]     HPI: [name] returns to discuss work-up and management of her right upper lobe lung nodule.  Shaquera Ansley is a 70 year old non-smoker with a past medical history significant for leiomyosarcoma of the vagina in 2014, hypertension, asthma, chronic bronchitis, positive PPD, stage III chronic kidney disease, anxiety, and depression.  She was diagnosed with leiomyosarcoma in 2014.  She had resection and that was treated with adjuvant chemotherapy.  This spring she had a recurrence.  She had a reresection and then was treated with radiation but was unable to complete her course.  She been followed with PET CTs.  She has had an old granuloma in the right upper lobe.  Over the past couple of PET CTs there is been a progressive increase in soft tissue associated with that calcified granuloma.  On most recent PET/CT there was some metabolic activity associated with that.  I had seen her on 05/15/2020.  We were planning to do a navigational bronchoscopy to mark the nodule followed by a robotic wedge resection for definitive diagnosis and treatment.  She was very adamant about wanting a resection at that visit.  After having some time to think about it she was interested to see if there was a completely bronchoscopic approach to managing the nodule.  She now returns to discuss her options.   We again reviewed the images.  This nodule cannot be resected bronchoscopically.  We discussed the possibility of doing a diagnostic navigational bronchoscopy to sample the nodule.  She understands the possible outcomes include definitive diagnosis of cancer, diagnosis of granuloma, or an indeterminate answer.  She understands the difficulty in trying to prove a negative.  She understands this would be strictly diagnostic and not therapeutic.  After our discussion she wishes to proceed with navigational bronchoscopy  for diagnostic purposes.  She understands the indications, risks, benefits, and alternatives.  She understands we will do this with general anesthesia.  We should be able to do it on an outpatient basis.  Electromagnetic navigational bronchoscopy on Friday, 06/06/2020  I spent over 10 minutes in review of records, images, and in discussion with [name] today.  Melrose Nakayama, MD [facility] [phone]

## 2020-05-27 ENCOUNTER — Other Ambulatory Visit: Payer: Self-pay | Admitting: *Deleted

## 2020-05-27 ENCOUNTER — Other Ambulatory Visit (HOSPITAL_COMMUNITY): Payer: Medicare Other

## 2020-05-27 ENCOUNTER — Telehealth: Payer: Self-pay | Admitting: Internal Medicine

## 2020-05-27 DIAGNOSIS — R911 Solitary pulmonary nodule: Secondary | ICD-10-CM

## 2020-05-27 DIAGNOSIS — R300 Dysuria: Secondary | ICD-10-CM

## 2020-05-27 DIAGNOSIS — N390 Urinary tract infection, site not specified: Secondary | ICD-10-CM

## 2020-05-27 NOTE — Telephone Encounter (Signed)
Patient wondering if we can place an order for a urinalysis, patient has been experiencing burning while urinating, fatigue, and nausea.

## 2020-05-28 ENCOUNTER — Other Ambulatory Visit (INDEPENDENT_AMBULATORY_CARE_PROVIDER_SITE_OTHER): Payer: Medicare Other

## 2020-05-28 ENCOUNTER — Ambulatory Visit (INDEPENDENT_AMBULATORY_CARE_PROVIDER_SITE_OTHER): Payer: Medicare Other | Admitting: Psychology

## 2020-05-28 DIAGNOSIS — R300 Dysuria: Secondary | ICD-10-CM

## 2020-05-28 DIAGNOSIS — F4321 Adjustment disorder with depressed mood: Secondary | ICD-10-CM | POA: Diagnosis not present

## 2020-05-28 LAB — URINALYSIS, ROUTINE W REFLEX MICROSCOPIC
Bilirubin Urine: NEGATIVE
Hgb urine dipstick: NEGATIVE
Ketones, ur: NEGATIVE
Nitrite: POSITIVE — AB
RBC / HPF: NONE SEEN (ref 0–?)
Specific Gravity, Urine: 1.01 (ref 1.000–1.030)
Total Protein, Urine: NEGATIVE
Urine Glucose: NEGATIVE
Urobilinogen, UA: 0.2 (ref 0.0–1.0)
pH: 6 (ref 5.0–8.0)

## 2020-05-28 NOTE — Telephone Encounter (Signed)
Notified pt UA has been ordered.Marland KitchenJohny Grant

## 2020-05-29 ENCOUNTER — Inpatient Hospital Stay: Admit: 2020-05-29 | Payer: Medicare Other | Admitting: Thoracic Surgery (Cardiothoracic Vascular Surgery)

## 2020-05-29 ENCOUNTER — Telehealth: Payer: Self-pay | Admitting: Internal Medicine

## 2020-05-29 DIAGNOSIS — N390 Urinary tract infection, site not specified: Secondary | ICD-10-CM

## 2020-05-29 DIAGNOSIS — R319 Hematuria, unspecified: Secondary | ICD-10-CM

## 2020-05-29 SURGERY — WEDGE RESECTION, LUNG, ROBOT-ASSISTED, THORACOSCOPIC
Anesthesia: General | Site: Chest | Laterality: Right

## 2020-05-29 NOTE — Telephone Encounter (Signed)
    Patient requesting lab results from urine test  Call medication to [id number] - [place], [place].

## 2020-05-30 MED ORDER — CIPROFLOXACIN HCL 250 MG PO TABS: 250.0000 mg | ORAL_TABLET | Freq: Two times a day (BID) | ORAL | 0 refills | Status: DC

## 2020-05-30 NOTE — Telephone Encounter (Signed)
UA is abnormal.  We can use Cipro if worse or not better.  I will email her prescription.  Thanks

## 2020-05-30 NOTE — Telephone Encounter (Signed)
UA is abnormal We can send Cipro if symptomatic. Thanks

## 2020-05-30 NOTE — Telephone Encounter (Signed)
Called pt there was no answer LMOM w/MD response. [facility]lmb

## 2020-05-30 NOTE — Telephone Encounter (Signed)
Called pt there was no answer LMOM w/MD response../lmb 

## 2020-06-03 NOTE — Progress Notes (Signed)
[id number] [address], [place] [address] Phone: [phone] Fax: [phone]      Your procedure is scheduled on Friday January 21st.  Report to [place] Entrance "A" at 10:50 A.M., and check in at the Admitting office.  Call this number if you have problems the morning of surgery:  [phone]  Call [phone] if you have any questions prior to your surgery date Monday-Friday 8am-4pm    Remember:  Do not eat or drink anything after midnight the night before your surgery    Take these medicines the morning of surgery with A SIP OF WATER '  busPIRone (BUSPAR) 30 MG tablet  ciprofloxacin (CIPRO) 250 MG tablet  colesevelam (WELCHOL) 625 MG tablet  esomeprazole (NEXIUM) 40 MG capsule  estradiol (ESTRACE) 1 MG tablet  FERREX 150 150 MG capsule  FLUoxetine (PROZAC) 40 MG capsule  levothyroxine (SYNTHROID) 100 MCG tablet   IF NEEDED  acetaminophen (TYLENOL) 325 MG tablet  albuterol (PROVENTIL) (2.5 MG/3ML) 0.083% nebulizer solution  albuterol (VENTOLIN HFA) 108 (90 Base) MCG/ACT inhaler - Bring with you to Hospital  ALPRAZolam (XANAX) 0.5 MG tablet  ipratropium-albuterol (DUONEB) 0.5-2.5 (3) MG/3ML SOLN  ondansetron (ZOFRAN) 4 MG tablet  Polyethyl Glycol-Propyl Glycol (SYSTANE OP)  prochlorperazine (COMPAZINE) 10 MG tablet  SUMAtriptan (IMITREX) 100 MG table   As of today, STOP taking any Aspirin (unless otherwise instructed by your surgeon) Aleve, Naproxen, Ibuprofen, Motrin, Advil, Goody's, BC's, all herbal medications, fish oil, and all vitamins.                      Do not wear jewelry, make up, or nail polish            Do not wear lotions, powders, perfumes, or deodorant.            Do not shave 48 hours prior to surgery.              Do not bring valuables to the hospital.            [facility] is not responsible for any belongings or valuables.  Do NOT Smoke (Tobacco/Vaping) or drink Alcohol 24  hours prior to your procedure If you use a CPAP at night, you may bring all equipment for your overnight stay.   Contacts, glasses, dentures or bridgework may not be worn into surgery.      For patients admitted to the hospital, discharge time will be determined by your treatment team.   Patients discharged the day of surgery will not be allowed to drive home, and someone needs to stay with them for 24 hours.    Special instructions:   [facility]- Preparing For Surgery  Before surgery, you can play an important role. Because skin is not sterile, your skin needs to be as free of germs as possible. You can reduce the number of germs on your skin by washing with CHG (chlorahexidine gluconate) Soap before surgery.  CHG is an antiseptic cleaner which kills germs and bonds with the skin to continue killing germs even after washing.    Oral Hygiene is also important to reduce your risk of infection.  Remember - BRUSH YOUR TEETH THE MORNING OF SURGERY WITH YOUR REGULAR TOOTHPASTE  Please do not use if you have an allergy to CHG or antibacterial soaps. If your skin becomes reddened/irritated stop using the CHG.  Do not shave (including legs and underarms) for at least 48  hours prior to first CHG shower. It is OK to shave your face.  Please follow these instructions carefully.   1. Shower the NIGHT BEFORE SURGERY and the MORNING OF SURGERY with CHG Soap.   2. If you chose to wash your hair, wash your hair first as usual with your normal shampoo.  3. After you shampoo, rinse your hair and body thoroughly to remove the shampoo.  4. Use CHG as you would any other liquid soap. You can apply CHG directly to the skin and wash gently with a scrungie or a clean washcloth.   5. Apply the CHG Soap to your body ONLY FROM THE NECK DOWN.  Do not use on open wounds or open sores. Avoid contact with your eyes, ears, mouth and genitals (private parts). Wash Face and genitals (private parts)  with your normal  soap.   6. Wash thoroughly, paying special attention to the area where your surgery will be performed.  7. Thoroughly rinse your body with warm water from the neck down.  8. DO NOT shower/wash with your normal soap after using and rinsing off the CHG Soap.  9. Pat yourself dry with a CLEAN TOWEL.  10. Wear CLEAN PAJAMAS to bed the night before surgery  11. Place CLEAN SHEETS on your bed the night of your first shower and DO NOT SLEEP WITH PETS.   Day of Surgery: Wear Clean/Comfortable clothing the morning of surgery Do not apply any deodorants/lotions.   Remember to brush your teeth WITH YOUR REGULAR TOOTHPASTE.   Please read over the following fact sheets that you were given.

## 2020-06-04 ENCOUNTER — Other Ambulatory Visit (HOSPITAL_COMMUNITY): Payer: Medicare Other

## 2020-06-04 ENCOUNTER — Inpatient Hospital Stay (HOSPITAL_COMMUNITY)
Admission: RE | Admit: 2020-06-04 | Discharge: 2020-06-04 | Disposition: A | Discharge status: Home / Self Care | Payer: Medicare Other | Source: Ambulatory Visit

## 2020-06-09 ENCOUNTER — Telehealth: Payer: Self-pay | Admitting: Internal Medicine

## 2020-06-09 DIAGNOSIS — N3000 Acute cystitis without hematuria: Secondary | ICD-10-CM

## 2020-06-09 NOTE — Telephone Encounter (Signed)
Patient started having burning while urinating and some nausea yesterday, patient wondering if she can get a urinalysis.  [phone]

## 2020-06-10 NOTE — Telephone Encounter (Signed)
°  Follow up message  Please return call to patient, she wants to know if urine test can be ordered

## 2020-06-11 ENCOUNTER — Ambulatory Visit (INDEPENDENT_AMBULATORY_CARE_PROVIDER_SITE_OTHER): Payer: Medicare Other | Admitting: Psychology

## 2020-06-11 DIAGNOSIS — F4321 Adjustment disorder with depressed mood: Secondary | ICD-10-CM

## 2020-06-11 NOTE — Telephone Encounter (Signed)
Called pt there was no answer LMOM MD ok UA has been placed in sxs.Marland KitchenJohny Chess

## 2020-06-11 NOTE — Addendum Note (Signed)
Addended by: Earnstine Regal on: 06/11/2020 02:02 PM   Modules accepted: Orders

## 2020-06-11 NOTE — Telephone Encounter (Signed)
Okay UA and culture.  Thanks

## 2020-06-11 NOTE — Progress Notes (Addendum)
[id number] [address], [place] [address] Phone: [phone] Fax: [phone]                 Your procedure is scheduled on Monday January 31st.             Report to [facility] Main Entrance "A" at 5:30 A.M., and check in at the Admitting office.             Call this number if you have problems the morning of surgery:             [phone]  Call [phone] if you have any questions prior to your surgery date Monday-Friday 8am-4pm               Remember:             Do not eat or drink anything after midnight the night before your surgery                          Take these medicines the morning of surgery with A SIP OF WATER '             busPIRone (BUSPAR) 30 MG tablet             ciprofloxacin (CIPRO) 250 MG tablet             colesevelam (WELCHOL) 625 MG tablet             esomeprazole (NEXIUM) 40 MG capsule             estradiol (ESTRACE) 1 MG tablet             FERREX 150 150 MG capsule             FLUoxetine (PROZAC) 40 MG capsule             levothyroxine (SYNTHROID) 100 MCG tablet              IF NEEDED             acetaminophen (TYLENOL) 325 MG tablet             albuterol (PROVENTIL) (2.5 MG/3ML) 0.083% nebulizer solution             albuterol (VENTOLIN HFA) 108 (90 Base) MCG/ACT inhaler - Please bring with you to the Hospital             ALPRAZolam (XANAX) 0.5 MG tablet             ipratropium-albuterol (DUONEB) 0.5-2.5 (3) MG/3ML SOLN             ondansetron (ZOFRAN) 4 MG tablet             Polyethyl Glycol-Propyl Glycol (SYSTANE OP)             prochlorperazine (COMPAZINE) 10 MG tablet             SUMAtriptan (IMITREX) 100 MG table              As of today, STOP taking any Aspirin (unless otherwise instructed by your surgeon) Aleve, Naproxen, Ibuprofen, Motrin, Advil, Goody's, BC's, all herbal medications, fish oil, and all vitamins.   Do not wear jewelry, make up, or nail  polish Do notwear lotions, powders, perfumes, or deodorant. Do notshave 48 hours prior to surgery.  Do notbring valuables to the hospital. [facility] is not responsible for any  belongings or valuables.  Do NOT Smoke (Tobacco/Vaping) or drink Alcohol 24 hours prior to your procedure If you use a CPAP at night, you may bring all equipment for your overnight stay.  Contacts, glasses, dentures or bridgework may not be worn into surgery.    For patients admitted to the hospital, discharge time will be determined by your treatment team.  Patients discharged the day of surgery will not be allowed to drive home, and someone needs to stay with them for 24 hours.    Special instructions:   [facility]- Preparing For Surgery  Before surgery, you can play an important role. Because skin is not sterile, your skin needs to be as free of germs as possible. You can reduce the number of germs on your skin by washing with CHG (chlorahexidine gluconate) Soap before surgery.  CHG is an antiseptic cleaner which kills germs and bonds with the skin to continue killing germs even after washing.    Oral Hygiene is also important to reduce your risk of infection.  Remember - BRUSH YOUR TEETH THE MORNING OF SURGERY WITH YOUR REGULAR TOOTHPASTE  Please do not use if you have an allergy to CHG or antibacterial soaps. If your skin becomes reddened/irritated stop using the CHG.  Do not shave (including legs and underarms) for at least 48 hours prior to first CHG shower. It is OK to shave your face.  Please follow these instructions carefully.                                                                                                                               1. Shower the NIGHT BEFORE SURGERY and the MORNING OF SURGERY with CHG Soap.   2. If you chose to wash your hair, wash your hair first as usual with your normal shampoo.  3. After you  shampoo, rinse your hair and body thoroughly to remove the shampoo.  4. Use CHG as you would any other liquid soap. You can apply CHG directly to the skin and wash gently with a scrungie or a clean washcloth.   5. Apply the CHG Soap to your body ONLY FROM THE NECK DOWN.  Do not use on open wounds or open sores. Avoid contact with your eyes, ears, mouth and genitals (private parts). Wash Face and genitals (private parts)  with your normal soap.   6. Wash thoroughly, paying special attention to the area where your surgery will be performed.  7. Thoroughly rinse your body with warm water from the neck down.  8. DO NOT shower/wash with your normal soap after using and rinsing off the CHG Soap.  9. Pat yourself dry with a CLEAN TOWEL.  10. Wear CLEAN PAJAMAS to bed the night before surgery  11. Place CLEAN SHEETS on your bed the night of your first shower and DO NOT SLEEP WITH PETS.   Day of Surgery: Wear Clean/Comfortable clothing the morning of surgery Do notapply  any deodorants/lotions.  Remember to brush your teeth WITH YOUR REGULAR TOOTHPASTE.  Please read over the following fact sheets that you were given.

## 2020-06-12 ENCOUNTER — Other Ambulatory Visit (HOSPITAL_COMMUNITY)
Admission: RE | Admit: 2020-06-12 | Discharge: 2020-06-12 | Disposition: A | Discharge status: Home / Self Care | Payer: Medicare Other | Source: Ambulatory Visit | Attending: Thoracic Surgery (Cardiothoracic Vascular Surgery) | Admitting: Thoracic Surgery (Cardiothoracic Vascular Surgery)

## 2020-06-12 ENCOUNTER — Encounter (HOSPITAL_COMMUNITY): Payer: Self-pay

## 2020-06-12 ENCOUNTER — Other Ambulatory Visit: Payer: Self-pay

## 2020-06-12 ENCOUNTER — Encounter (HOSPITAL_COMMUNITY)
Admission: RE | Admit: 2020-06-12 | Discharge: 2020-06-12 | Disposition: A | Discharge status: Home / Self Care | Payer: Medicare Other | Source: Ambulatory Visit | Attending: Thoracic Surgery (Cardiothoracic Vascular Surgery) | Admitting: Thoracic Surgery (Cardiothoracic Vascular Surgery)

## 2020-06-12 ENCOUNTER — Other Ambulatory Visit (INDEPENDENT_AMBULATORY_CARE_PROVIDER_SITE_OTHER): Payer: Medicare Other

## 2020-06-12 DIAGNOSIS — Z01812 Encounter for preprocedural laboratory examination: Secondary | ICD-10-CM | POA: Insufficient documentation

## 2020-06-12 DIAGNOSIS — N3000 Acute cystitis without hematuria: Secondary | ICD-10-CM

## 2020-06-12 DIAGNOSIS — Z20822 Contact with and (suspected) exposure to covid-19: Secondary | ICD-10-CM | POA: Insufficient documentation

## 2020-06-12 DIAGNOSIS — R911 Solitary pulmonary nodule: Secondary | ICD-10-CM | POA: Insufficient documentation

## 2020-06-12 LAB — COMPREHENSIVE METABOLIC PANEL
ALT: 20 U/L (ref 0–44)
AST: 26 U/L (ref 15–41)
Albumin: 3.6 g/dL (ref 3.5–5.0)
Alkaline Phosphatase: 100 U/L (ref 38–126)
Anion gap: 9 (ref 5–15)
BUN: 38 mg/dL — ABNORMAL HIGH (ref 8–23)
CO2: 23 mmol/L (ref 22–32)
Calcium: 10.2 mg/dL (ref 8.9–10.3)
Chloride: 102 mmol/L (ref 98–111)
Creatinine, Ser: 1.64 mg/dL — ABNORMAL HIGH (ref 0.44–1.00)
GFR, Estimated: 33 mL/min — ABNORMAL LOW (ref >60)
Glucose, Bld: 96 mg/dL (ref 70–99)
Potassium: 4.6 mmol/L (ref 3.5–5.1)
Sodium: 134 mmol/L — ABNORMAL LOW (ref 135–145)
Total Bilirubin: 0.7 mg/dL (ref 0.3–1.2)
Total Protein: 6.8 g/dL (ref 6.5–8.1)

## 2020-06-12 LAB — URINALYSIS, ROUTINE W REFLEX MICROSCOPIC
Bilirubin Urine: NEGATIVE
Hgb urine dipstick: NEGATIVE
Ketones, ur: NEGATIVE
Nitrite: NEGATIVE
Specific Gravity, Urine: 1.01 (ref 1.000–1.030)
Total Protein, Urine: NEGATIVE
Urine Glucose: NEGATIVE
Urobilinogen, UA: 0.2 (ref 0.0–1.0)
pH: 6 (ref 5.0–8.0)

## 2020-06-12 LAB — CBC
HCT: 36.4 % (ref 36.0–46.0)
Hemoglobin: 12 g/dL (ref 12.0–15.0)
MCH: 31.3 pg (ref 26.0–34.0)
MCHC: 33 g/dL (ref 30.0–36.0)
MCV: 95 fL (ref 80.0–100.0)
Platelets: 356 10*3/uL (ref 150–400)
RBC: 3.83 MIL/uL — ABNORMAL LOW (ref 3.87–5.11)
RDW: 13 % (ref 11.5–15.5)
WBC: 7.2 10*3/uL (ref 4.0–10.5)
nRBC: 0 % (ref 0.0–0.2)

## 2020-06-12 LAB — PROTIME-INR
INR: 1 (ref 0.8–1.2)
Prothrombin Time: 12.3 seconds (ref 11.4–15.2)

## 2020-06-12 LAB — APTT: aPTT: 27 seconds (ref 24–36)

## 2020-06-12 NOTE — Progress Notes (Signed)
[id number] [address], [place] [address] Phone: [phone] Fax: [phone]                 Your procedure is scheduled on Monday January 31st.             Report to [facility] Main Entrance "A" at 5:30 A.M., and check in at the Admitting office.             Call this number if you have problems the morning of surgery:             [phone]  Call [phone] if you have any questions prior to your surgery date Monday-Friday 8am-4pm               Remember:             Do not eat or drink anything after midnight the night before your surgery                          Take these medicines the morning of surgery with A SIP OF WATER: '             busPIRone (BUSPAR)              ciprofloxacin (CIPRO)             colesevelam (WELCHOL)              esomeprazole (NEXIUM)              estradiol (ESTRACE)             FLUoxetine (PROZAC)              levothyroxine (SYNTHROID)               IF NEEDED - you may take the following medications the morning of your procedure as needed:              acetaminophen (TYLENOL)              albuterol (PROVENTIL) nebulizer solution             albuterol (VENTOLIN HFA) inhaler - Please bring all inhalers with you the day of surgery.              ALPRAZolam (XANAX)             ipratropium-albuterol (DUONEB)             ondansetron (ZOFRAN)              Polyethyl Glycol-Propyl Glycol (SYSTANE OP)             prochlorperazine (COMPAZINE)              SUMAtriptan (IMITREX)               As of today, STOP taking any Aspirin (unless otherwise instructed by your surgeon) Aleve, Naproxen, Ibuprofen, Motrin, Advil, Goody's, BC's, all herbal medications - including D-Mannose, fish oil, and all vitamins.  Follow your surgeon's instructions on when to stop Aspirin.  If no instructions were given by your surgeon then you will need to call the office to get those instructions.      Do not wear jewelry, make up, or nail polish Do notwear lotions, powders, perfumes, or deodorant. Do notshave 48 hours prior to surgery.  Do notbring valuables to the hospital. [facility] is not responsible for any  belongings or valuables.  Do NOT Smoke (Tobacco/Vaping) or drink Alcohol 24 hours prior to your procedure  If you use a CPAP at night, you may bring all equipment for your overnight stay.  Contacts, glasses, dentures or bridgework may not be worn into surgery.    For patients admitted to the hospital, discharge time will be determined by your treatment team.  Patients discharged the day of surgery will not be allowed to drive home, and someone needs to stay with them for 24 hours.    Special instructions:   [facility]- Preparing For Surgery  Before surgery, you can play an important role. Because skin is not sterile, your skin needs to be as free of germs as possible. You can reduce the number of germs on your skin by washing with CHG (chlorahexidine gluconate) Soap before surgery.  CHG is an antiseptic cleaner which kills germs and bonds with the skin to continue killing germs even after washing.    Oral Hygiene is also important to reduce your risk of infection.  Remember - BRUSH YOUR TEETH THE MORNING OF SURGERY WITH YOUR REGULAR TOOTHPASTE  Please do not use if you have an allergy to CHG or antibacterial soaps. If your skin becomes reddened/irritated stop using the CHG.  Do not shave (including legs and underarms) for at least 48 hours prior to first CHG shower. It is OK to shave your face.  Please follow these instructions carefully.                                                                                                                               1. Shower the NIGHT BEFORE SURGERY and the MORNING OF SURGERY with CHG Soap.   2. If you chose to wash your hair, wash your  hair first as usual with your normal shampoo.  3. After you shampoo, rinse your hair and body thoroughly to remove the shampoo.  4. Use CHG as you would any other liquid soap. You can apply CHG directly to the skin and wash gently with a scrungie or a clean washcloth.   5. Apply the CHG Soap to your body ONLY FROM THE NECK DOWN.  Do not use on open wounds or open sores. Avoid contact with your eyes, ears, mouth and genitals (private parts). Wash Face and genitals (private parts)  with your normal soap.   6. Wash thoroughly, paying special attention to the area where your surgery will be performed.  7. Thoroughly rinse your body with warm water from the neck down.  8. DO NOT shower/wash with your normal soap after using and rinsing off the CHG Soap.  9. Pat yourself dry with a CLEAN TOWEL.  10. Wear CLEAN PAJAMAS to bed the night before surgery  11. Place CLEAN SHEETS on your bed the night of your first shower and DO NOT SLEEP WITH PETS.   Day of Surgery: Shower with CHG soap as directed Wear Clean/Comfortable  clothing the morning of surgery Do notapply any deodorants/lotions.  Remember to brush your teeth WITH YOUR REGULAR TOOTHPASTE.  Please read over the following fact sheets that you were given.

## 2020-06-12 NOTE — Progress Notes (Signed)
PCP - Dr. Alain Marion Cardiologist - Dr. Shirlee More Nephrologist - Dr. Carolin Sicks Oncologist - Dr. Jeral Pinch  PPM/ICD - n/a Device Orders -  Rep Notified -   Chest x-ray - DOS EKG - 05/21/20 Stress Test - 08/22/2017 ECHO - 10/15/2016 Cardiac Cath - 08/26/2017  Sleep Study - n/a, negative stop bang CPAP -   Fasting Blood Sugar - n/a Checks Blood Sugar _____ times a day  Blood Thinner Instructions:n/a Aspirin Instructions: per Levonne Spiller, patient to continue ASA, hold DOS  ERAS Protcol - n/a PRE-SURGERY Ensure or G2-   COVID TEST- 06/12/20   Anesthesia review: yes, patient has history of cardiac testing.  Patient denies shortness of breath, fever, cough and chest pain at PAT appointment   All instructions explained to the patient, with a verbal understanding of the material. Patient agrees to go over the instructions while at home for a better understanding. Patient also instructed to self quarantine after being tested for COVID-19. The opportunity to ask questions was provided.

## 2020-06-13 LAB — SARS CORONAVIRUS 2 (TAT 6-24 HRS): SARS Coronavirus 2: NEGATIVE

## 2020-06-15 NOTE — Anesthesia Preprocedure Evaluation (Addendum)
Anesthesia Evaluation  Patient identified by MRN, date of birth, ID band Patient awake    Reviewed: NPO status , Patient's Chart, lab work & pertinent test results  Airway Mallampati: II  TM Distance: >3 FB Neck ROM: Limited    Dental  (+) Teeth Intact   Pulmonary asthma ,  RUL nodule   Pulmonary exam normal        Cardiovascular hypertension, + DVT   Rhythm:Regular Rate:Normal     Neuro/Psych  Headaches, Anxiety Depression Bipolar Disorder Dementia    GI/Hepatic Neg liver ROS, GERD  Medicated and Controlled,  Endo/Other  Hypothyroidism   Renal/GU    leiomyosarcoma    Musculoskeletal  (+) Arthritis , Fibromyalgia -Cervical neck fusion   Abdominal (+)  Abdomen: soft. Bowel sounds: normal.  Peds  Hematology  (+) anemia ,   Anesthesia Other Findings   Reproductive/Obstetrics                            Anesthesia Physical Anesthesia Plan  ASA: III  Anesthesia Plan: General   Post-op Pain Management:    Induction: Intravenous  PONV Risk Score and Plan: 3 and Ondansetron, Dexamethasone and Propofol infusion  Airway Management Planned: Mask and Oral ETT  Additional Equipment: None  Intra-op Plan:   Post-operative Plan: Extubation in OR  Informed Consent: I have reviewed the patients History and Physical, chart, labs and discussed the procedure including the risks, benefits and alternatives for the proposed anesthesia with the patient or authorized representative who has indicated his/her understanding and acceptance.     Dental advisory given  Plan Discussed with: CRNA  Anesthesia Plan Comments: (Lab Results      Component                Value               Date                      WBC                      7.2                 06/12/2020                HGB                      12.0                06/12/2020                HCT                      36.4                 06/12/2020                MCV                      95.0                06/12/2020                PLT                      356  06/12/2020           .Lab Results      Component                Value               Date                      NA                       134 (L)             06/12/2020                K                        4.6                 06/12/2020                CO2                      23                  06/12/2020                GLUCOSE                  96                  06/12/2020                BUN                      38 (H)              06/12/2020                CREATININE               1.64 (H)            06/12/2020                CALCIUM                  10.2                06/12/2020                GFRNONAA                 33 (L)              06/12/2020                GFRAA                    50 (L)              11/26/2019          )       Anesthesia Quick Evaluation

## 2020-06-16 ENCOUNTER — Ambulatory Visit (HOSPITAL_COMMUNITY): Payer: Medicare Other | Admitting: Certified Registered Nurse Anesthetist

## 2020-06-16 ENCOUNTER — Other Ambulatory Visit: Payer: Self-pay

## 2020-06-16 ENCOUNTER — Encounter (HOSPITAL_COMMUNITY): Payer: Self-pay | Admitting: Thoracic Surgery (Cardiothoracic Vascular Surgery)

## 2020-06-16 ENCOUNTER — Encounter (HOSPITAL_COMMUNITY)
Admission: RE | Disposition: A | Discharge status: Home / Self Care | Payer: Self-pay | Source: Home / Self Care | Attending: Thoracic Surgery (Cardiothoracic Vascular Surgery)

## 2020-06-16 ENCOUNTER — Ambulatory Visit (HOSPITAL_COMMUNITY): Payer: Medicare Other

## 2020-06-16 ENCOUNTER — Ambulatory Visit (HOSPITAL_COMMUNITY): Payer: Medicare Other | Admitting: Physician Assistant

## 2020-06-16 ENCOUNTER — Ambulatory Visit (HOSPITAL_COMMUNITY)
Admission: RE | Admit: 2020-06-16 | Discharge: 2020-06-16 | Disposition: A | Discharge status: Home / Self Care | Payer: Medicare Other | Attending: Thoracic Surgery (Cardiothoracic Vascular Surgery) | Admitting: Thoracic Surgery (Cardiothoracic Vascular Surgery)

## 2020-06-16 DIAGNOSIS — Z9221 Personal history of antineoplastic chemotherapy: Secondary | ICD-10-CM | POA: Diagnosis not present

## 2020-06-16 DIAGNOSIS — Z86718 Personal history of other venous thrombosis and embolism: Secondary | ICD-10-CM | POA: Diagnosis not present

## 2020-06-16 DIAGNOSIS — Z01818 Encounter for other preprocedural examination: Secondary | ICD-10-CM | POA: Diagnosis not present

## 2020-06-16 DIAGNOSIS — R918 Other nonspecific abnormal finding of lung field: Secondary | ICD-10-CM | POA: Diagnosis not present

## 2020-06-16 DIAGNOSIS — Z419 Encounter for procedure for purposes other than remedying health state, unspecified: Secondary | ICD-10-CM

## 2020-06-16 DIAGNOSIS — J45909 Unspecified asthma, uncomplicated: Secondary | ICD-10-CM | POA: Diagnosis not present

## 2020-06-16 DIAGNOSIS — I129 Hypertensive chronic kidney disease with stage 1 through stage 4 chronic kidney disease, or unspecified chronic kidney disease: Secondary | ICD-10-CM | POA: Insufficient documentation

## 2020-06-16 DIAGNOSIS — E039 Hypothyroidism, unspecified: Secondary | ICD-10-CM | POA: Diagnosis not present

## 2020-06-16 DIAGNOSIS — Z923 Personal history of irradiation: Secondary | ICD-10-CM | POA: Insufficient documentation

## 2020-06-16 DIAGNOSIS — R911 Solitary pulmonary nodule: Secondary | ICD-10-CM | POA: Diagnosis not present

## 2020-06-16 DIAGNOSIS — Z8544 Personal history of malignant neoplasm of other female genital organs: Secondary | ICD-10-CM | POA: Insufficient documentation

## 2020-06-16 DIAGNOSIS — N183 Chronic kidney disease, stage 3 unspecified: Secondary | ICD-10-CM | POA: Diagnosis not present

## 2020-06-16 DIAGNOSIS — E785 Hyperlipidemia, unspecified: Secondary | ICD-10-CM | POA: Diagnosis not present

## 2020-06-16 DIAGNOSIS — Z9889 Other specified postprocedural states: Secondary | ICD-10-CM | POA: Diagnosis not present

## 2020-06-16 HISTORY — PX: VIDEO BRONCHOSCOPY WITH ENDOBRONCHIAL NAVIGATION: SHX6175

## 2020-06-16 SURGERY — VIDEO BRONCHOSCOPY WITH ENDOBRONCHIAL NAVIGATION
Anesthesia: General

## 2020-06-16 MED ORDER — LACTATED RINGERS IV SOLN: INTRAVENOUS | Status: DC

## 2020-06-16 MED ORDER — 0.9 % SODIUM CHLORIDE (POUR BTL) OPTIME
TOPICAL | Status: DC | PRN
  Administered 2020-06-16: 1000 mL

## 2020-06-16 MED ORDER — PHENYLEPHRINE HCL-NACL 10-0.9 MG/250ML-% IV SOLN
INTRAVENOUS | Status: DC | PRN
  Administered 2020-06-16: 40 ug/min via INTRAVENOUS

## 2020-06-16 MED ORDER — ONDANSETRON HCL 4 MG/2ML IJ SOLN
INTRAMUSCULAR | Status: DC | PRN
  Administered 2020-06-16: 4 mg via INTRAVENOUS

## 2020-06-16 MED ORDER — FENTANYL CITRATE (PF) 250 MCG/5ML IJ SOLN
INTRAMUSCULAR | Status: AC
  Filled 2020-06-16: qty 5

## 2020-06-16 MED ORDER — FENTANYL CITRATE (PF) 250 MCG/5ML IJ SOLN
INTRAMUSCULAR | Status: DC | PRN
  Administered 2020-06-16: 50 ug via INTRAVENOUS
  Administered 2020-06-16: 100 ug via INTRAVENOUS

## 2020-06-16 MED ORDER — MIDAZOLAM HCL 2 MG/2ML IJ SOLN
INTRAMUSCULAR | Status: AC
  Filled 2020-06-16: qty 2

## 2020-06-16 MED ORDER — LIDOCAINE 2% (20 MG/ML) 5 ML SYRINGE
INTRAMUSCULAR | Status: AC
  Filled 2020-06-16: qty 5

## 2020-06-16 MED ORDER — ROCURONIUM BROMIDE 10 MG/ML (PF) SYRINGE
PREFILLED_SYRINGE | INTRAVENOUS | Status: AC
  Filled 2020-06-16: qty 10

## 2020-06-16 MED ORDER — DEXAMETHASONE SODIUM PHOSPHATE 10 MG/ML IJ SOLN
INTRAMUSCULAR | Status: AC
  Filled 2020-06-16: qty 1

## 2020-06-16 MED ORDER — PROPOFOL 500 MG/50ML IV EMUL
INTRAVENOUS | Status: DC | PRN
  Administered 2020-06-16: 50 ug/kg/min via INTRAVENOUS

## 2020-06-16 MED ORDER — LACTATED RINGERS IV SOLN: INTRAVENOUS | Status: DC | PRN

## 2020-06-16 MED ORDER — PHENYLEPHRINE 40 MCG/ML (10ML) SYRINGE FOR IV PUSH (FOR BLOOD PRESSURE SUPPORT)
PREFILLED_SYRINGE | INTRAVENOUS | Status: AC
  Filled 2020-06-16: qty 20

## 2020-06-16 MED ORDER — CHLORHEXIDINE GLUCONATE 0.12 % MT SOLN
15.0000 mL | Freq: Once | OROMUCOSAL | Status: AC
  Administered 2020-06-16: 15 mL via OROMUCOSAL

## 2020-06-16 MED ORDER — EPINEPHRINE PF 1 MG/ML IJ SOLN
INTRAMUSCULAR | Status: AC
  Filled 2020-06-16: qty 1

## 2020-06-16 MED ORDER — PROPOFOL 1000 MG/100ML IV EMUL
INTRAVENOUS | Status: AC
  Filled 2020-06-16: qty 100

## 2020-06-16 MED ORDER — EPINEPHRINE PF 1 MG/ML IJ SOLN
INTRAMUSCULAR | Status: DC | PRN
  Administered 2020-06-16: 1 mg via ENDOTRACHEOPULMONARY

## 2020-06-16 MED ORDER — ACETAMINOPHEN 10 MG/ML IV SOLN: 1000.0000 mg | Freq: Once | INTRAVENOUS | Status: DC | PRN

## 2020-06-16 MED ORDER — PHENYLEPHRINE 40 MCG/ML (10ML) SYRINGE FOR IV PUSH (FOR BLOOD PRESSURE SUPPORT)
PREFILLED_SYRINGE | INTRAVENOUS | Status: DC | PRN
  Administered 2020-06-16 (×2): 40 ug via INTRAVENOUS
  Administered 2020-06-16: 80 ug via INTRAVENOUS

## 2020-06-16 MED ORDER — CHLORHEXIDINE GLUCONATE 0.12 % MT SOLN
OROMUCOSAL | Status: AC
  Filled 2020-06-16: qty 15

## 2020-06-16 MED ORDER — ORAL CARE MOUTH RINSE: 15.0000 mL | Freq: Once | OROMUCOSAL | Status: AC

## 2020-06-16 MED ORDER — PROPOFOL 10 MG/ML IV BOLUS
INTRAVENOUS | Status: AC
  Filled 2020-06-16: qty 20

## 2020-06-16 MED ORDER — FENTANYL CITRATE (PF) 100 MCG/2ML IJ SOLN: 25.0000 ug | INTRAMUSCULAR | Status: DC | PRN

## 2020-06-16 MED ORDER — DEXAMETHASONE SODIUM PHOSPHATE 10 MG/ML IJ SOLN
INTRAMUSCULAR | Status: DC | PRN
  Administered 2020-06-16: 10 mg via INTRAVENOUS

## 2020-06-16 MED ORDER — ONDANSETRON HCL 4 MG/2ML IJ SOLN
INTRAMUSCULAR | Status: AC
  Filled 2020-06-16: qty 2

## 2020-06-16 MED ORDER — SUGAMMADEX SODIUM 200 MG/2ML IV SOLN
INTRAVENOUS | Status: DC | PRN
  Administered 2020-06-16: 100 mg via INTRAVENOUS
  Administered 2020-06-16: 200 mg via INTRAVENOUS

## 2020-06-16 MED ORDER — MIDAZOLAM HCL 5 MG/5ML IJ SOLN
INTRAMUSCULAR | Status: DC | PRN
  Administered 2020-06-16: 1 mg via INTRAVENOUS

## 2020-06-16 MED ORDER — LIDOCAINE 2% (20 MG/ML) 5 ML SYRINGE
INTRAMUSCULAR | Status: DC | PRN
  Administered 2020-06-16: 80 mg via INTRAVENOUS

## 2020-06-16 MED ORDER — PROPOFOL 10 MG/ML IV BOLUS
INTRAVENOUS | Status: DC | PRN
  Administered 2020-06-16: 30 mg via INTRAVENOUS
  Administered 2020-06-16: 20 mg via INTRAVENOUS
  Administered 2020-06-16: 150 mg via INTRAVENOUS

## 2020-06-16 MED ORDER — ROCURONIUM BROMIDE 100 MG/10ML IV SOLN
INTRAVENOUS | Status: DC | PRN
  Administered 2020-06-16: 10 mg via INTRAVENOUS
  Administered 2020-06-16: 20 mg via INTRAVENOUS
  Administered 2020-06-16: 50 mg via INTRAVENOUS

## 2020-06-16 MED ORDER — ARTIFICIAL TEARS OPHTHALMIC OINT
TOPICAL_OINTMENT | OPHTHALMIC | Status: AC
  Filled 2020-06-16: qty 3.5

## 2020-06-16 MED ORDER — ONDANSETRON HCL 4 MG/2ML IJ SOLN: 4.0000 mg | Freq: Once | INTRAMUSCULAR | Status: DC | PRN

## 2020-06-16 MED ORDER — PHENYLEPHRINE HCL-NACL 10-0.9 MG/250ML-% IV SOLN
INTRAVENOUS | Status: AC
  Filled 2020-06-16: qty 250

## 2020-06-16 SURGICAL SUPPLY — 42 items
ADAPTER BRONCHOSCOPE OLYMPUS (ADAPTER) ×2 IMPLANT
ADAPTER VALVE BIOPSY EBUS (MISCELLANEOUS) IMPLANT
ADPR BSCP OLMPS EDG (ADAPTER) ×1
ADPTR VALVE BIOPSY EBUS (MISCELLANEOUS)
BLADE CLIPPER SURG (BLADE) ×2 IMPLANT
BRUSH BIOPSY BRONCH 10 SDTNB (MISCELLANEOUS) ×1 IMPLANT
BRUSH SUPERTRAX BIOPSY (INSTRUMENTS) IMPLANT
BRUSH SUPERTRAX NDL-TIP CYTO (INSTRUMENTS) ×3 IMPLANT
CANISTER SUCT 3000ML PPV (MISCELLANEOUS) ×2 IMPLANT
CNTNR URN SCR LID CUP LEK RST (MISCELLANEOUS) ×2 IMPLANT
CONT SPEC 4OZ STRL OR WHT (MISCELLANEOUS) ×4
COVER BACK TABLE 60X90IN (DRAPES) ×2 IMPLANT
FILTER STRAW FLUID ASPIR (MISCELLANEOUS) IMPLANT
FORCEPS BIOP SUPERTRX PREMAR (INSTRUMENTS) ×1 IMPLANT
GAUZE SPONGE 4X4 12PLY STRL (GAUZE/BANDAGES/DRESSINGS) ×2 IMPLANT
GLOVE SURG SIGNA 7.5 PF LTX (GLOVE) ×3 IMPLANT
GOWN STRL REUS W/ TWL XL LVL3 (GOWN DISPOSABLE) ×1 IMPLANT
GOWN STRL REUS W/TWL XL LVL3 (GOWN DISPOSABLE) ×2
KIT CLEAN ENDO COMPLIANCE (KITS) ×2 IMPLANT
KIT ILLUMISITE 180 PROCEDURE (KITS) ×1 IMPLANT
KIT ILLUMISITE 90 PROCEDURE (KITS) IMPLANT
KIT TURNOVER KIT B (KITS) ×2 IMPLANT
MARKER SKIN DUAL TIP RULER LAB (MISCELLANEOUS) ×2 IMPLANT
NDL SUPERTRX PREMARK BIOPSY (NEEDLE) IMPLANT
NEEDLE SUPERTRX PREMARK BIOPSY (NEEDLE) ×2 IMPLANT
NS IRRIG 1000ML POUR BTL (IV SOLUTION) ×2 IMPLANT
OIL SILICONE PENTAX (PARTS (SERVICE/REPAIRS)) ×2 IMPLANT
PAD ARMBOARD 7.5X6 YLW CONV (MISCELLANEOUS) ×4 IMPLANT
PATCHES PATIENT (LABEL) ×6 IMPLANT
SYR 20ML ECCENTRIC (SYRINGE) ×2 IMPLANT
SYR 20ML LL LF (SYRINGE) ×2 IMPLANT
SYR 30ML LL (SYRINGE) ×2 IMPLANT
SYR 5ML LL (SYRINGE) ×2 IMPLANT
TOWEL GREEN STERILE (TOWEL DISPOSABLE) ×2 IMPLANT
TOWEL GREEN STERILE FF (TOWEL DISPOSABLE) ×2 IMPLANT
TRAP SPECIMEN MUCUS 40CC (MISCELLANEOUS) ×2 IMPLANT
TUBE CONNECTING 20X1/4 (TUBING) ×4 IMPLANT
UNDERPAD 30X36 HEAVY ABSORB (UNDERPADS AND DIAPERS) ×2 IMPLANT
VALVE BIOPSY  SINGLE USE (MISCELLANEOUS) ×2
VALVE BIOPSY SINGLE USE (MISCELLANEOUS) ×1 IMPLANT
VALVE SUCTION BRONCHIO DISP (MISCELLANEOUS) ×2 IMPLANT
WATER STERILE IRR 1000ML POUR (IV SOLUTION) ×2 IMPLANT

## 2020-06-16 NOTE — Op Note (Signed)
NAME: [name], [name] MEDICAL RECORD [id number] ACCOUNT [id number] DATE OF BIRTH:11-May-1951 FACILITY: [facility] LOCATION: [facility] PHYSICIAN:Nahmir Zeidman Chaya Jan, MD  OPERATIVE REPORT  DATE OF PROCEDURE:  06/16/2020  PREOPERATIVE DIAGNOSIS:  Right upper lobe lung nodule.  POSTOPERATIVE DIAGNOSIS:  Right upper lobe lung nodule.  PROCEDURE:  Electromagnetic navigational bronchoscopy with needle aspirations, brushings, transbronchial biopsies, and bronchoalveolar lavage.  SURGEON:  Modesto Charon, MD  ASSISTANT:  None.  ANESTHESIA:  General.  FINDINGS:  Navigated to within a centimeter of the nodule with good alignment, initial needle aspirations and brushings nondiagnostic.  CLINICAL NOTE:  [name] is a 70 year old nonsmoker with a history of pelvic leiomyosarcoma and granulomatous disease.  On serial PET-CT, there has been progressive increase in soft tissue associated with an old calcified granuloma.  On her most  recent PET-CT, there was metabolic activity associated with that.  We had discussed wedge resection for definitive diagnosis and treatment, but after consideration, she wished to have bronchoscopy as a standalone procedure to attempt to make a diagnosis.   She understood there was no guarantee of a definitive diagnosis.  The indications, risks, benefits, and alternatives were discussed in detail with the patient.  She accepted the risks and agreed to proceed.  DESCRIPTION OF PROCEDURE:  [name] was brought to the operating room on 06/16/2020.  Anesthesia was induced, and she was intubated.  A timeout was performed.  Flexible fiberoptic bronchoscopy was performed via the endotracheal tube.  It revealed  normal endobronchial anatomy with no endobronchial lesions to the level of the subsegmental bronchi.  The locatable guide for navigation was placed.  Registration was performed.  There was good correlation of the video and virtual bronchoscopy.   The bronchoscope was directed to the right upper lobe bronchus and the appropriate subsegmental bronchus was  cannulated.  The catheter was advanced and was within 1.5 centimeter of the nodule.  Local registration was performed, and then with manipulation the catheter the guide was within a centimeter of the center of the nodule with good alignment.  Sampling then was performed.  All sampling was done with fluoroscopy.  Three needle aspirations were performed followed by 3 passes with a needle brush.  While these specimens were being examined, multiple biopsies were obtained.  Biopsies were separated  with one set to be sent for pathology and the other to be sent for AFB and fungal cultures.  The initial needle aspirations and brushings were nondiagnostic.  The catheter was repositioned slightly, and sampling was repeated with another needle  aspiration, another pass of the needle brush, and then 2 passes with the triple brush.  Additional biopsies were taken.  The additional samples were also nondiagnostic.  Bronchoalveolar lavage then was performed instilling 100 mL of saline with  withdrawal of approximately 15 mL.  This was sent for cytology.  Final inspection was made with the bronchoscope.  There was no ongoing bleeding.  The bronchoscope was removed.  The total fluoroscopy time was 2.8 minutes with a total dose of 28  milligray.  The patient then was extubated in the operating room and taken to the postanesthetic care unit in good condition.  IN/NUANCE  D:06/16/2020 T:06/16/2020 JOB:[id number]

## 2020-06-16 NOTE — Anesthesia Procedure Notes (Addendum)
Procedure Name: Intubation Date/Time: 06/16/2020 7:39 AM Performed by: Janene Harvey, CRNA Pre-anesthesia Checklist: Patient identified, Emergency Drugs available, Suction available and Patient being monitored Patient Re-evaluated:Patient Re-evaluated prior to induction Oxygen Delivery Method: Circle system utilized Preoxygenation: Pre-oxygenation with 100% oxygen Induction Type: IV induction Ventilation: Mask ventilation without difficulty Laryngoscope Size: Mac and 3 Grade View: Grade I Tube type: Oral Tube size: 8.0 mm Number of attempts: 1 Airway Equipment and Method: Stylet and Oral airway Placement Confirmation: ETT inserted through vocal cords under direct vision,  positive ETCO2 and breath sounds checked- equal and bilateral Secured at: 20 cm Tube secured with: Tape Dental Injury: Teeth and Oropharynx as per pre-operative assessment  Comments: Intubation by Lenny Pastel

## 2020-06-16 NOTE — Discharge Instructions (Addendum)
Do not drive or engage in heavy physical activity for 24 hours  You may resume normal activities tomorrow  You may cough up small amounts of blood over the next few days  You may use acetaminophen (Tylenol) if needed for discomfort. You may use over the counter throat lozenges or cough medication if needed  My office will contact you with follow up information  Call [phone] if you develop chest pain, shortness of breath, or cough up more than 2 tablespoons of blood

## 2020-06-16 NOTE — Anesthesia Postprocedure Evaluation (Signed)
Anesthesia Post Note  Patient: [name]  Procedure(s) Performed: VIDEO BRONCHOSCOPY WITH ENDOBRONCHIAL NAVIGATION (N/A )     Patient location during evaluation: PACU Anesthesia Type: General Level of consciousness: awake and alert Pain management: pain level controlled Vital Signs Assessment: post-procedure vital signs reviewed and stable Respiratory status: spontaneous breathing, nonlabored ventilation, respiratory function stable and patient connected to nasal cannula oxygen Cardiovascular status: blood pressure returned to baseline and stable Postop Assessment: no apparent nausea or vomiting Anesthetic complications: no   No complications documented.  Last Vitals:  Vitals:   06/16/20 0919 06/16/20 0930  BP: (!) 97/53   Pulse: 72   Resp: 11   Temp:  36.5 C  SpO2: 100%     Last Pain:  Vitals:   06/16/20 0850  TempSrc:   PainSc: 0-No pain                 March Rummage Margarett Viti

## 2020-06-16 NOTE — Interval H&P Note (Signed)
History and Physical Interval Note:  06/16/2020 7:23 AM  [name]  has presented today for surgery, with the diagnosis of RUL NODULE.  The various methods of treatment have been discussed with the patient and family. After consideration of risks, benefits and other options for treatment, the patient has consented to  Procedure(s): [place] (N/A) as a surgical intervention.  The patient's history has been reviewed, patient examined, no change in status, stable for surgery.  I have reviewed the patient's chart and labs.  Questions were answered to the patient's satisfaction.     Melrose Nakayama

## 2020-06-16 NOTE — Brief Op Note (Signed)
06/16/2020  8:43 AM  PATIENT:  [name]  70 y.o. female  PRE-OPERATIVE DIAGNOSIS:  RUL NODULE  POST-OPERATIVE DIAGNOSIS:  RUL NODULE  PROCEDURE:  Procedure(s): VIDEO BRONCHOSCOPY WITH ENDOBRONCHIAL NAVIGATION (N/A) Needle aspirations, brushings, transbronchial biopsies, BAL  SURGEON:  Surgeon(s) and Role:    * Melrose Nakayama, MD - Primary  PHYSICIAN ASSISTANT:   ASSISTANTS: none   ANESTHESIA:   general  EBL: minimal  BLOOD ADMINISTERED:none  DRAINS: none   LOCAL MEDICATIONS USED:  NONE  SPECIMEN:  Source of Specimen:  RUL nodule  DISPOSITION OF SPECIMEN:  Path and micro  COUNTS:  NO endo  TOURNIQUET:  * No tourniquets in log *  DICTATION: .Other Dictation: Dictation Number -  PLAN OF CARE: Discharge to home after PACU  PATIENT DISPOSITION:  PACU - hemodynamically stable.   Delay start of Pharmacological VTE agent (>24hrs) due to surgical blood loss or risk of bleeding: not applicable

## 2020-06-16 NOTE — Transfer of Care (Signed)
Immediate Anesthesia Transfer of Care Note  Patient: [name]  Procedure(s) Performed: VIDEO BRONCHOSCOPY WITH ENDOBRONCHIAL NAVIGATION (N/A )  Patient Location: PACU  Anesthesia Type:General  Level of Consciousness: awake and patient cooperative  Airway & Oxygen Therapy: Patient Spontanous Breathing and Patient connected to face mask oxygen  Post-op Assessment: Report given to RN and Post -op Vital signs reviewed and stable  Post vital signs: Reviewed  Last Vitals:  Vitals Value Taken Time  BP 116/65 06/16/20 0850  Temp 36.5 C 06/16/20 0850  Pulse 80 06/16/20 0850  Resp 12 06/16/20 0850  SpO2 100 % 06/16/20 0850    Last Pain:  Vitals:   06/16/20 0605  TempSrc:   PainSc: 0-No pain      Patients Stated Pain Goal: 3 (03/10/84 2778)  Complications: No complications documented.

## 2020-06-17 ENCOUNTER — Encounter (HOSPITAL_COMMUNITY): Payer: Self-pay | Admitting: Thoracic Surgery (Cardiothoracic Vascular Surgery)

## 2020-06-17 LAB — ACID FAST SMEAR (AFB, MYCOBACTERIA): Acid Fast Smear: NEGATIVE

## 2020-06-17 LAB — CYTOLOGY - NON PAP

## 2020-06-20 ENCOUNTER — Ambulatory Visit: Payer: Medicare Other | Admitting: Thoracic Surgery (Cardiothoracic Vascular Surgery)

## 2020-06-24 ENCOUNTER — Other Ambulatory Visit: Payer: Self-pay

## 2020-06-24 ENCOUNTER — Encounter: Payer: Self-pay | Admitting: Thoracic Surgery (Cardiothoracic Vascular Surgery)

## 2020-06-24 ENCOUNTER — Other Ambulatory Visit: Payer: Self-pay | Admitting: *Deleted

## 2020-06-24 ENCOUNTER — Ambulatory Visit (INDEPENDENT_AMBULATORY_CARE_PROVIDER_SITE_OTHER): Payer: Medicare Other | Admitting: Thoracic Surgery (Cardiothoracic Vascular Surgery)

## 2020-06-24 ENCOUNTER — Encounter: Payer: Self-pay | Admitting: *Deleted

## 2020-06-24 VITALS — BP 91/68 | HR 108 | Resp 20 | Ht 62.0 in

## 2020-06-24 DIAGNOSIS — R911 Solitary pulmonary nodule: Secondary | ICD-10-CM | POA: Diagnosis not present

## 2020-06-24 DIAGNOSIS — Z9889 Other specified postprocedural states: Secondary | ICD-10-CM
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
## 2020-06-25 ENCOUNTER — Ambulatory Visit (INDEPENDENT_AMBULATORY_CARE_PROVIDER_SITE_OTHER): Payer: Medicare Other | Admitting: Psychology

## 2020-06-25 DIAGNOSIS — F4321 Adjustment disorder with depressed mood: Secondary | ICD-10-CM

## 2020-06-28 ENCOUNTER — Other Ambulatory Visit: Payer: Self-pay | Admitting: Internal Medicine

## 2020-06-30 ENCOUNTER — Other Ambulatory Visit: Payer: Self-pay | Admitting: Internal Medicine

## 2020-07-01 ENCOUNTER — Other Ambulatory Visit (HOSPITAL_COMMUNITY): Payer: Self-pay | Admitting: Nephrology

## 2020-07-01 ENCOUNTER — Other Ambulatory Visit: Payer: Self-pay | Admitting: Nephrology

## 2020-07-01 DIAGNOSIS — N2581 Secondary hyperparathyroidism of renal origin: Secondary | ICD-10-CM

## 2020-07-02 DIAGNOSIS — Z20822 Contact with and (suspected) exposure to covid-19: Secondary | ICD-10-CM | POA: Diagnosis not present

## 2020-07-04 ENCOUNTER — Telehealth (INDEPENDENT_AMBULATORY_CARE_PROVIDER_SITE_OTHER): Payer: Medicare Other | Admitting: Family

## 2020-07-04 ENCOUNTER — Other Ambulatory Visit: Payer: Self-pay

## 2020-07-04 DIAGNOSIS — N390 Urinary tract infection, site not specified: Secondary | ICD-10-CM

## 2020-07-04 DIAGNOSIS — R319 Hematuria, unspecified: Secondary | ICD-10-CM | POA: Diagnosis not present

## 2020-07-04 MED ORDER — CIPROFLOXACIN HCL 250 MG PO TABS: 250.0000 mg | ORAL_TABLET | Freq: Two times a day (BID) | ORAL | 0 refills | Status: DC
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
## 2020-07-09 ENCOUNTER — Ambulatory Visit (INDEPENDENT_AMBULATORY_CARE_PROVIDER_SITE_OTHER): Payer: Medicare Other | Admitting: Psychology

## 2020-07-09 DIAGNOSIS — F4321 Adjustment disorder with depressed mood: Secondary | ICD-10-CM

## 2020-07-14 ENCOUNTER — Ambulatory Visit: Payer: Medicare Other | Admitting: Internal Medicine

## 2020-07-14 NOTE — Progress Notes (Signed)
Surgical Instructions    Your procedure is scheduled on 07/17/20.  Report to [facility] Main Entrance "A" at 6:00 A.M., then check in with the Admitting office.  Call this number if you have problems the morning of surgery:  [phone]   If you have any questions prior to your surgery date call [phone]: Open Monday-Friday 8am-4pm    Remember:  Do not eat  OR drink after midnight the night before your surgery    Take these medicines the morning of surgery with A SIP OF WATER: busPIRone (BUSPAR) ciprofloxacin (CIPRO) colesevelam (WELCHOL)  esomeprazole (NEXIUM)  estradiol (ESTRACE) FLUoxetine (PROZAC) levothyroxine (SYNTHROID)  IF NEEDED: acetaminophen (TYLENOL)  albuterol (PROVENTIL)  ALPRAZolam (XANAX)  ipratropium-albuterol (DUONEB)  ondansetron (ZOFRAN) prochlorperazine (COMPAZINE) SUMAtriptan (IMITREX)   As of today, STOP taking any Aspirin (unless otherwise instructed by your surgeon) Aleve, Naproxen, Ibuprofen, Motrin, Advil, Goody's, BC's, all herbal medications, fish oil, and all vitamins.                     Do not wear jewelry, make up, or nail polish            Do not wear lotions, powders, perfumes or deodorant.            Do not shave 48 hours prior to surgery.              Do not bring valuables to the hospital.            [facility] is not responsible for any belongings or valuables.  Do NOT Smoke (Tobacco/Vaping) or drink Alcohol 24 hours prior to your procedure If you use a CPAP at night, you may bring all equipment for your overnight stay.   Contacts, glasses, dentures or bridgework may not be worn into surgery, please bring cases for these belongings   For patients admitted to the hospital, discharge time will be determined by your treatment team.   Patients discharged the day of surgery will not be allowed to drive home, and someone needs to stay with them for 24 hours.    Special instructions:   [place]- Preparing For  Surgery  Before surgery, you can play an important role. Because skin is not sterile, your skin needs to be as free of germs as possible. You can reduce the number of germs on your skin by washing with CHG (chlorahexidine gluconate) Soap before surgery.  CHG is an antiseptic cleaner which kills germs and bonds with the skin to continue killing germs even after washing.    Oral Hygiene is also important to reduce your risk of infection.  Remember - BRUSH YOUR TEETH THE MORNING OF SURGERY WITH YOUR REGULAR TOOTHPASTE  Please do not use if you have an allergy to CHG or antibacterial soaps. If your skin becomes reddened/irritated stop using the CHG.  Do not shave (including legs and underarms) for at least 48 hours prior to first CHG shower. It is OK to shave your face.  Please follow these instructions carefully.   1. Shower the NIGHT BEFORE SURGERY and the MORNING OF SURGERY  2. If you chose to wash your hair, wash your hair first as usual with your normal shampoo.  3. After you shampoo, rinse your hair and body thoroughly to remove the shampoo.  4. Wash Face and genitals (private parts) with your normal soap.   5.  Shower the NIGHT BEFORE SURGERY and the MORNING OF SURGERY with CHG Soap.   6. Use  CHG Soap as you would any other liquid soap. You can apply CHG directly to the skin and wash gently with a scrungie or a clean washcloth.   7. Apply the CHG Soap to your body ONLY FROM THE NECK DOWN.  Do not use on open wounds or open sores. Avoid contact with your eyes, ears, mouth and genitals (private parts). Wash Face and genitals (private parts)  with your normal soap.   8. Wash thoroughly, paying special attention to the area where your surgery will be performed.  9. Thoroughly rinse your body with warm water from the neck down.  10. DO NOT shower/wash with your normal soap after using and rinsing off the CHG Soap.  11. Pat yourself dry with a CLEAN TOWEL.  12. Wear CLEAN PAJAMAS to bed  the night before surgery  13. Place CLEAN SHEETS on your bed the night before your surgery  14. DO NOT SLEEP WITH PETS.   Day of Surgery: Wear Clean/Comfortable clothing the morning of surgery Do not apply any deodorants/lotions.   Remember to brush your teeth WITH YOUR REGULAR TOOTHPASTE.   Please read over the following fact sheets that you were given.

## 2020-07-15 ENCOUNTER — Other Ambulatory Visit (HOSPITAL_COMMUNITY)
Admission: RE | Admit: 2020-07-15 | Discharge: 2020-07-15 | Disposition: A | Discharge status: Home / Self Care | Payer: Medicare Other | Source: Ambulatory Visit

## 2020-07-15 ENCOUNTER — Encounter (HOSPITAL_COMMUNITY)
Admission: RE | Admit: 2020-07-15 | Discharge: 2020-07-15 | Disposition: A | Discharge status: Home / Self Care | Payer: Medicare Other | Source: Ambulatory Visit | Attending: Thoracic Surgery (Cardiothoracic Vascular Surgery) | Admitting: Thoracic Surgery (Cardiothoracic Vascular Surgery)

## 2020-07-15 ENCOUNTER — Other Ambulatory Visit: Payer: Self-pay

## 2020-07-15 ENCOUNTER — Encounter (HOSPITAL_COMMUNITY): Payer: Self-pay

## 2020-07-15 DIAGNOSIS — Z20822 Contact with and (suspected) exposure to covid-19: Secondary | ICD-10-CM | POA: Diagnosis not present

## 2020-07-15 DIAGNOSIS — C3411 Malignant neoplasm of upper lobe, right bronchus or lung: Secondary | ICD-10-CM | POA: Diagnosis not present

## 2020-07-15 DIAGNOSIS — Z888 Allergy status to other drugs, medicaments and biological substances status: Secondary | ICD-10-CM | POA: Diagnosis not present

## 2020-07-15 DIAGNOSIS — K219 Gastro-esophageal reflux disease without esophagitis: Secondary | ICD-10-CM | POA: Diagnosis not present

## 2020-07-15 DIAGNOSIS — R911 Solitary pulmonary nodule: Secondary | ICD-10-CM

## 2020-07-15 DIAGNOSIS — J4530 Mild persistent asthma, uncomplicated: Secondary | ICD-10-CM | POA: Diagnosis not present

## 2020-07-15 DIAGNOSIS — M797 Fibromyalgia: Secondary | ICD-10-CM | POA: Diagnosis not present

## 2020-07-15 DIAGNOSIS — R918 Other nonspecific abnormal finding of lung field: Secondary | ICD-10-CM | POA: Diagnosis not present

## 2020-07-15 DIAGNOSIS — Z8542 Personal history of malignant neoplasm of other parts of uterus: Secondary | ICD-10-CM | POA: Diagnosis not present

## 2020-07-15 DIAGNOSIS — D630 Anemia in neoplastic disease: Secondary | ICD-10-CM | POA: Diagnosis not present

## 2020-07-15 DIAGNOSIS — E039 Hypothyroidism, unspecified: Secondary | ICD-10-CM | POA: Diagnosis not present

## 2020-07-15 DIAGNOSIS — J939 Pneumothorax, unspecified: Secondary | ICD-10-CM | POA: Diagnosis not present

## 2020-07-15 DIAGNOSIS — Z8611 Personal history of tuberculosis: Secondary | ICD-10-CM | POA: Diagnosis not present

## 2020-07-15 DIAGNOSIS — Z01812 Encounter for preprocedural laboratory examination: Secondary | ICD-10-CM | POA: Insufficient documentation

## 2020-07-15 DIAGNOSIS — Z01818 Encounter for other preprocedural examination: Secondary | ICD-10-CM | POA: Diagnosis not present

## 2020-07-15 DIAGNOSIS — Z885 Allergy status to narcotic agent status: Secondary | ICD-10-CM | POA: Diagnosis not present

## 2020-07-15 DIAGNOSIS — Z79899 Other long term (current) drug therapy: Secondary | ICD-10-CM | POA: Diagnosis not present

## 2020-07-15 DIAGNOSIS — F411 Generalized anxiety disorder: Secondary | ICD-10-CM | POA: Diagnosis present

## 2020-07-15 DIAGNOSIS — Z7982 Long term (current) use of aspirin: Secondary | ICD-10-CM | POA: Diagnosis not present

## 2020-07-15 DIAGNOSIS — N183 Chronic kidney disease, stage 3 unspecified: Secondary | ICD-10-CM | POA: Diagnosis not present

## 2020-07-15 DIAGNOSIS — I129 Hypertensive chronic kidney disease with stage 1 through stage 4 chronic kidney disease, or unspecified chronic kidney disease: Secondary | ICD-10-CM | POA: Diagnosis not present

## 2020-07-15 DIAGNOSIS — J9811 Atelectasis: Secondary | ICD-10-CM | POA: Diagnosis not present

## 2020-07-15 DIAGNOSIS — Z85831 Personal history of malignant neoplasm of soft tissue: Secondary | ICD-10-CM | POA: Diagnosis not present

## 2020-07-15 DIAGNOSIS — F319 Bipolar disorder, unspecified: Secondary | ICD-10-CM | POA: Diagnosis present

## 2020-07-15 DIAGNOSIS — Z91048 Other nonmedicinal substance allergy status: Secondary | ICD-10-CM | POA: Diagnosis not present

## 2020-07-15 DIAGNOSIS — E785 Hyperlipidemia, unspecified: Secondary | ICD-10-CM | POA: Diagnosis not present

## 2020-07-15 DIAGNOSIS — Z881 Allergy status to other antibiotic agents status: Secondary | ICD-10-CM | POA: Diagnosis not present

## 2020-07-15 DIAGNOSIS — Z9221 Personal history of antineoplastic chemotherapy: Secondary | ICD-10-CM | POA: Diagnosis not present

## 2020-07-15 DIAGNOSIS — Z4682 Encounter for fitting and adjustment of non-vascular catheter: Secondary | ICD-10-CM | POA: Diagnosis not present

## 2020-07-15 DIAGNOSIS — Z79890 Hormone replacement therapy (postmenopausal): Secondary | ICD-10-CM | POA: Diagnosis not present

## 2020-07-15 DIAGNOSIS — Z79818 Long term (current) use of other agents affecting estrogen receptors and estrogen levels: Secondary | ICD-10-CM | POA: Diagnosis not present

## 2020-07-15 DIAGNOSIS — D491 Neoplasm of unspecified behavior of respiratory system: Secondary | ICD-10-CM | POA: Diagnosis not present

## 2020-07-15 DIAGNOSIS — D62 Acute posthemorrhagic anemia: Secondary | ICD-10-CM | POA: Diagnosis not present

## 2020-07-15 DIAGNOSIS — Z86718 Personal history of other venous thrombosis and embolism: Secondary | ICD-10-CM | POA: Diagnosis not present

## 2020-07-15 LAB — BLOOD GAS, ARTERIAL
Acid-base deficit: 4.9 mmol/L — ABNORMAL HIGH (ref 0.0–2.0)
Bicarbonate: 19.1 mmol/L — ABNORMAL LOW (ref 20.0–28.0)
FIO2: 21
O2 Saturation: 95.9 %
Patient temperature: 37
pCO2 arterial: 31.9 mmHg — ABNORMAL LOW (ref 32.0–48.0)
pH, Arterial: 7.395 (ref 7.350–7.450)
pO2, Arterial: 80.3 mmHg — ABNORMAL LOW (ref 83.0–108.0)

## 2020-07-15 LAB — CBC
HCT: 38.1 % (ref 36.0–46.0)
Hemoglobin: 12.3 g/dL (ref 12.0–15.0)
MCH: 31 pg (ref 26.0–34.0)
MCHC: 32.3 g/dL (ref 30.0–36.0)
MCV: 96 fL (ref 80.0–100.0)
Platelets: 332 10*3/uL (ref 150–400)
RBC: 3.97 MIL/uL (ref 3.87–5.11)
RDW: 13.5 % (ref 11.5–15.5)
WBC: 6.3 10*3/uL (ref 4.0–10.5)
nRBC: 0 % (ref 0.0–0.2)

## 2020-07-15 LAB — COMPREHENSIVE METABOLIC PANEL
ALT: 27 U/L (ref 0–44)
AST: 30 U/L (ref 15–41)
Albumin: 3.7 g/dL (ref 3.5–5.0)
Alkaline Phosphatase: 98 U/L (ref 38–126)
Anion gap: 12 (ref 5–15)
BUN: 30 mg/dL — ABNORMAL HIGH (ref 8–23)
CO2: 17 mmol/L — ABNORMAL LOW (ref 22–32)
Calcium: 10.4 mg/dL — ABNORMAL HIGH (ref 8.9–10.3)
Chloride: 107 mmol/L (ref 98–111)
Creatinine, Ser: 1.55 mg/dL — ABNORMAL HIGH (ref 0.44–1.00)
GFR, Estimated: 36 mL/min — ABNORMAL LOW (ref >60)
Glucose, Bld: 108 mg/dL — ABNORMAL HIGH (ref 70–99)
Potassium: 4.5 mmol/L (ref 3.5–5.1)
Sodium: 136 mmol/L (ref 135–145)
Total Bilirubin: 0.8 mg/dL (ref 0.3–1.2)
Total Protein: 6.6 g/dL (ref 6.5–8.1)

## 2020-07-15 LAB — URINALYSIS, ROUTINE W REFLEX MICROSCOPIC
Bilirubin Urine: NEGATIVE
Glucose, UA: NEGATIVE mg/dL
Hgb urine dipstick: NEGATIVE
Ketones, ur: NEGATIVE mg/dL
Leukocytes,Ua: NEGATIVE
Nitrite: NEGATIVE
Protein, ur: NEGATIVE mg/dL
Specific Gravity, Urine: 1.01 (ref 1.005–1.030)
pH: 5 (ref 5.0–8.0)

## 2020-07-15 LAB — PROTIME-INR
INR: 1 (ref 0.8–1.2)
Prothrombin Time: 12.3 seconds (ref 11.4–15.2)

## 2020-07-15 LAB — FUNGUS CULTURE WITH STAIN

## 2020-07-15 LAB — SURGICAL PCR SCREEN
MRSA, PCR: NEGATIVE
Staphylococcus aureus: NEGATIVE

## 2020-07-15 LAB — FUNGAL ORGANISM REFLEX

## 2020-07-15 LAB — APTT: aPTT: 28 seconds (ref 24–36)

## 2020-07-15 LAB — FUNGUS CULTURE RESULT

## 2020-07-15 NOTE — Progress Notes (Signed)
PCP:  Lew Dawes, MD Cardiologist: Shirlee More, MD  EKG: 07/15/20 CXR: 07/15/20 ECHO: 10/15/16 Stress Test: 08/22/17 Cardiac Cath: 08/26/17  Fasting Blood Sugar- na Checks Blood Sugar_na__ times a day  ASA:  Last dose 07/13/20 Blood Thinner:  No  OSA/CPAP:  No  Covid test 07/15/20  Anesthesia Review:  No.  Cardiac workup was normal per patient.  Her symptoms were related to UTI. Denies any cardiac history.  Patient denies shortness of breath, fever, cough, and chest pain at PAT appointment.  Patient verbalized understanding of instructions provided today at the PAT appointment.  Patient asked to review instructions at home and day of surgery.

## 2020-07-16 LAB — SARS CORONAVIRUS 2 (TAT 6-24 HRS): SARS Coronavirus 2: NEGATIVE

## 2020-07-17 ENCOUNTER — Inpatient Hospital Stay (HOSPITAL_COMMUNITY): Payer: Medicare Other | Admitting: Certified Registered Nurse Anesthetist

## 2020-07-17 ENCOUNTER — Other Ambulatory Visit: Payer: Self-pay

## 2020-07-17 ENCOUNTER — Inpatient Hospital Stay (HOSPITAL_COMMUNITY): Payer: Medicare Other

## 2020-07-17 ENCOUNTER — Encounter (HOSPITAL_COMMUNITY): Payer: Self-pay | Admitting: Thoracic Surgery (Cardiothoracic Vascular Surgery)

## 2020-07-17 ENCOUNTER — Encounter (HOSPITAL_COMMUNITY)
Admission: RE | Disposition: A | Discharge status: Home / Self Care | Payer: Self-pay | Source: Home / Self Care | Attending: Thoracic Surgery (Cardiothoracic Vascular Surgery)

## 2020-07-17 ENCOUNTER — Inpatient Hospital Stay (HOSPITAL_COMMUNITY)
Admission: RE | Admit: 2020-07-17 | Discharge: 2020-07-19 | DRG: 164 | Disposition: A | Discharge status: Home / Self Care | Payer: Medicare Other | Attending: Thoracic Surgery (Cardiothoracic Vascular Surgery) | Admitting: Thoracic Surgery (Cardiothoracic Vascular Surgery)

## 2020-07-17 DIAGNOSIS — Z79818 Long term (current) use of other agents affecting estrogen receptors and estrogen levels: Secondary | ICD-10-CM

## 2020-07-17 DIAGNOSIS — N183 Chronic kidney disease, stage 3 unspecified: Secondary | ICD-10-CM | POA: Diagnosis present

## 2020-07-17 DIAGNOSIS — Z8611 Personal history of tuberculosis: Secondary | ICD-10-CM | POA: Diagnosis not present

## 2020-07-17 DIAGNOSIS — Z7982 Long term (current) use of aspirin: Secondary | ICD-10-CM | POA: Diagnosis not present

## 2020-07-17 DIAGNOSIS — M797 Fibromyalgia: Secondary | ICD-10-CM | POA: Diagnosis present

## 2020-07-17 DIAGNOSIS — Z09 Encounter for follow-up examination after completed treatment for conditions other than malignant neoplasm: Secondary | ICD-10-CM

## 2020-07-17 DIAGNOSIS — Z8542 Personal history of malignant neoplasm of other parts of uterus: Secondary | ICD-10-CM | POA: Diagnosis not present

## 2020-07-17 DIAGNOSIS — Z86718 Personal history of other venous thrombosis and embolism: Secondary | ICD-10-CM | POA: Diagnosis not present

## 2020-07-17 DIAGNOSIS — Z79890 Hormone replacement therapy (postmenopausal): Secondary | ICD-10-CM | POA: Diagnosis not present

## 2020-07-17 DIAGNOSIS — C3411 Malignant neoplasm of upper lobe, right bronchus or lung: Secondary | ICD-10-CM | POA: Diagnosis present

## 2020-07-17 DIAGNOSIS — Z9221 Personal history of antineoplastic chemotherapy: Secondary | ICD-10-CM

## 2020-07-17 DIAGNOSIS — Z20822 Contact with and (suspected) exposure to covid-19: Secondary | ICD-10-CM | POA: Diagnosis present

## 2020-07-17 DIAGNOSIS — J9811 Atelectasis: Secondary | ICD-10-CM | POA: Diagnosis not present

## 2020-07-17 DIAGNOSIS — K219 Gastro-esophageal reflux disease without esophagitis: Secondary | ICD-10-CM | POA: Diagnosis present

## 2020-07-17 DIAGNOSIS — F319 Bipolar disorder, unspecified: Secondary | ICD-10-CM | POA: Diagnosis present

## 2020-07-17 DIAGNOSIS — R911 Solitary pulmonary nodule: Secondary | ICD-10-CM

## 2020-07-17 DIAGNOSIS — Z881 Allergy status to other antibiotic agents status: Secondary | ICD-10-CM | POA: Diagnosis not present

## 2020-07-17 DIAGNOSIS — E785 Hyperlipidemia, unspecified: Secondary | ICD-10-CM | POA: Diagnosis present

## 2020-07-17 DIAGNOSIS — E039 Hypothyroidism, unspecified: Secondary | ICD-10-CM | POA: Diagnosis present

## 2020-07-17 DIAGNOSIS — I129 Hypertensive chronic kidney disease with stage 1 through stage 4 chronic kidney disease, or unspecified chronic kidney disease: Secondary | ICD-10-CM | POA: Diagnosis present

## 2020-07-17 DIAGNOSIS — Z91048 Other nonmedicinal substance allergy status: Secondary | ICD-10-CM

## 2020-07-17 DIAGNOSIS — G8918 Other acute postprocedural pain: Secondary | ICD-10-CM

## 2020-07-17 DIAGNOSIS — Z419 Encounter for procedure for purposes other than remedying health state, unspecified: Secondary | ICD-10-CM

## 2020-07-17 DIAGNOSIS — R918 Other nonspecific abnormal finding of lung field: Secondary | ICD-10-CM | POA: Diagnosis present

## 2020-07-17 DIAGNOSIS — D62 Acute posthemorrhagic anemia: Secondary | ICD-10-CM | POA: Diagnosis not present

## 2020-07-17 DIAGNOSIS — Z888 Allergy status to other drugs, medicaments and biological substances status: Secondary | ICD-10-CM

## 2020-07-17 DIAGNOSIS — Z885 Allergy status to narcotic agent status: Secondary | ICD-10-CM

## 2020-07-17 DIAGNOSIS — Z79899 Other long term (current) drug therapy: Secondary | ICD-10-CM

## 2020-07-17 DIAGNOSIS — F411 Generalized anxiety disorder: Secondary | ICD-10-CM | POA: Diagnosis present

## 2020-07-17 HISTORY — PX: LYMPH NODE DISSECTION: SHX5087

## 2020-07-17 HISTORY — PX: INTERCOSTAL NERVE BLOCK: SHX5021

## 2020-07-17 HISTORY — PX: VIDEO BRONCHOSCOPY WITH ENDOBRONCHIAL NAVIGATION: SHX6175

## 2020-07-17 LAB — POCT I-STAT 7, (LYTES, BLD GAS, ICA,H+H)
Acid-base deficit: 9 mmol/L — ABNORMAL HIGH (ref 0.0–2.0)
Acid-base deficit: 10 mmol/L — ABNORMAL HIGH (ref 0.0–2.0)
Bicarbonate: 18.1 mmol/L — ABNORMAL LOW (ref 20.0–28.0)
Bicarbonate: 18.4 mmol/L — ABNORMAL LOW (ref 20.0–28.0)
Calcium, Ion: 1.33 mmol/L (ref 1.15–1.40)
Calcium, Ion: 1.35 mmol/L (ref 1.15–1.40)
HCT: 25 % — ABNORMAL LOW (ref 36.0–46.0)
HCT: 24 % — ABNORMAL LOW (ref 36.0–46.0)
Hemoglobin: 8.5 g/dL — ABNORMAL LOW (ref 12.0–15.0)
Hemoglobin: 8.2 g/dL — ABNORMAL LOW (ref 12.0–15.0)
O2 Saturation: 92 %
O2 Saturation: 91 %
Patient temperature: 35
Patient temperature: 34.5
Potassium: 4.7 mmol/L (ref 3.5–5.1)
Potassium: 4.9 mmol/L (ref 3.5–5.1)
Sodium: 138 mmol/L (ref 135–145)
Sodium: 138 mmol/L (ref 135–145)
TCO2: 19 mmol/L — ABNORMAL LOW (ref 22–32)
TCO2: 20 mmol/L — ABNORMAL LOW (ref 22–32)
pCO2 arterial: 39.7 mmHg (ref 32.0–48.0)
pCO2 arterial: 44.7 mmHg (ref 32.0–48.0)
pH, Arterial: 7.256 — ABNORMAL LOW (ref 7.350–7.450)
pH, Arterial: 7.207 — ABNORMAL LOW (ref 7.350–7.450)
pO2, Arterial: 65 mmHg — ABNORMAL LOW (ref 83.0–108.0)
pO2, Arterial: 65 mmHg — ABNORMAL LOW (ref 83.0–108.0)

## 2020-07-17 LAB — BLOOD GAS, ARTERIAL
Acid-base deficit: 9.8 mmol/L — ABNORMAL HIGH (ref 0.0–2.0)
Bicarbonate: 16.1 mmol/L — ABNORMAL LOW (ref 20.0–28.0)
Drawn by: 13403
FIO2: 21
O2 Saturation: 92.2 %
Patient temperature: 36.3
pCO2 arterial: 36.9 mmHg (ref 32.0–48.0)
pH, Arterial: 7.258 — ABNORMAL LOW (ref 7.350–7.450)
pO2, Arterial: 67.9 mmHg — ABNORMAL LOW (ref 83.0–108.0)

## 2020-07-17 LAB — PREPARE RBC (CROSSMATCH)

## 2020-07-17 SURGERY — WEDGE RESECTION, LUNG, ROBOT-ASSISTED, THORACOSCOPIC
Anesthesia: General | Site: Chest | Laterality: Right

## 2020-07-17 MED ORDER — FENTANYL CITRATE (PF) 250 MCG/5ML IJ SOLN
INTRAMUSCULAR | Status: AC
  Filled 2020-07-17: qty 5

## 2020-07-17 MED ORDER — LACTATED RINGERS IV SOLN: INTRAVENOUS | Status: DC | PRN

## 2020-07-17 MED ORDER — ACETAMINOPHEN 10 MG/ML IV SOLN
INTRAVENOUS | Status: AC
  Filled 2020-07-17: qty 100

## 2020-07-17 MED ORDER — MIDAZOLAM HCL 2 MG/2ML IJ SOLN
INTRAMUSCULAR | Status: AC
  Filled 2020-07-17: qty 2

## 2020-07-17 MED ORDER — POLYVINYL ALCOHOL 1.4 % OP SOLN
1.0000 [drp] | OPHTHALMIC | Status: DC | PRN
  Filled 2020-07-17: qty 15

## 2020-07-17 MED ORDER — CHLORHEXIDINE GLUCONATE 0.12 % MT SOLN
OROMUCOSAL | Status: AC
  Administered 2020-07-17: 15 mL via OROMUCOSAL
  Filled 2020-07-17: qty 15

## 2020-07-17 MED ORDER — VANCOMYCIN HCL IN DEXTROSE 1-5 GM/200ML-% IV SOLN
1000.0000 mg | INTRAVENOUS | Status: AC
  Administered 2020-07-17: 1000 mg via INTRAVENOUS
  Filled 2020-07-17: qty 200

## 2020-07-17 MED ORDER — SODIUM CHLORIDE 0.9% IV SOLUTION: Freq: Once | INTRAVENOUS | Status: DC

## 2020-07-17 MED ORDER — ONDANSETRON HCL 4 MG PO TABS: 4.0000 mg | ORAL_TABLET | Freq: Three times a day (TID) | ORAL | Status: DC | PRN

## 2020-07-17 MED ORDER — OXYCODONE HCL 5 MG PO TABS
ORAL_TABLET | ORAL | Status: AC
  Filled 2020-07-17: qty 1

## 2020-07-17 MED ORDER — ENOXAPARIN SODIUM 40 MG/0.4ML ~~LOC~~ SOLN
40.0000 mg | Freq: Every day | SUBCUTANEOUS | Status: DC
  Administered 2020-07-18 – 2020-07-19 (×2): 40 mg via SUBCUTANEOUS
  Filled 2020-07-17 (×2): qty 0.4

## 2020-07-17 MED ORDER — 0.9 % SODIUM CHLORIDE (POUR BTL) OPTIME
TOPICAL | Status: DC | PRN
  Administered 2020-07-17: 1000 mL

## 2020-07-17 MED ORDER — ACETAMINOPHEN 325 MG PO TABS: 650.0000 mg | ORAL_TABLET | Freq: Four times a day (QID) | ORAL | Status: DC | PRN

## 2020-07-17 MED ORDER — METHYLENE BLUE 0.5 % INJ SOLN
INTRAVENOUS | Status: AC
  Filled 2020-07-17: qty 10

## 2020-07-17 MED ORDER — PHENYLEPHRINE HCL-NACL 10-0.9 MG/250ML-% IV SOLN
INTRAVENOUS | Status: DC | PRN
  Administered 2020-07-17: 25 ug/min via INTRAVENOUS

## 2020-07-17 MED ORDER — OXYCODONE HCL 5 MG/5ML PO SOLN: 5.0000 mg | Freq: Once | ORAL | Status: AC | PRN

## 2020-07-17 MED ORDER — SUGAMMADEX SODIUM 200 MG/2ML IV SOLN
INTRAVENOUS | Status: DC | PRN
  Administered 2020-07-17: 300 mg via INTRAVENOUS

## 2020-07-17 MED ORDER — BUPIVACAINE LIPOSOME 1.3 % IJ SUSP
INTRAMUSCULAR | Status: DC | PRN
  Administered 2020-07-17: 100 mL

## 2020-07-17 MED ORDER — LIDOCAINE 2% (20 MG/ML) 5 ML SYRINGE
INTRAMUSCULAR | Status: AC
  Filled 2020-07-17: qty 5

## 2020-07-17 MED ORDER — FLUOXETINE HCL 20 MG PO CAPS
40.0000 mg | ORAL_CAPSULE | Freq: Every morning | ORAL | Status: DC
  Administered 2020-07-18 – 2020-07-19 (×2): 40 mg via ORAL
  Filled 2020-07-17: qty 2

## 2020-07-17 MED ORDER — METHYLENE BLUE 0.5 % INJ SOLN
INTRAVENOUS | Status: DC | PRN
  Administered 2020-07-17: .5 mL

## 2020-07-17 MED ORDER — ROCURONIUM BROMIDE 10 MG/ML (PF) SYRINGE
PREFILLED_SYRINGE | INTRAVENOUS | Status: AC
  Filled 2020-07-17: qty 10

## 2020-07-17 MED ORDER — BISACODYL 5 MG PO TBEC
10.0000 mg | DELAYED_RELEASE_TABLET | Freq: Every day | ORAL | Status: DC
  Administered 2020-07-19: 10 mg via ORAL
  Filled 2020-07-17 (×2): qty 2

## 2020-07-17 MED ORDER — POLYETHYL GLYCOL-PROPYL GLYCOL 0.4-0.3 % OP GEL: Freq: Two times a day (BID) | OPHTHALMIC | Status: DC | PRN

## 2020-07-17 MED ORDER — ACETAMINOPHEN 500 MG PO TABS: 1000.0000 mg | ORAL_TABLET | Freq: Once | ORAL | Status: DC | PRN

## 2020-07-17 MED ORDER — TRIAMTERENE-HCTZ 37.5-25 MG PO TABS
1.0000 | ORAL_TABLET | Freq: Every day | ORAL | Status: DC
  Administered 2020-07-18 – 2020-07-19 (×2): 1 via ORAL
  Filled 2020-07-17 (×2): qty 1

## 2020-07-17 MED ORDER — SODIUM BICARBONATE 8.4 % IV SOLN
50.0000 meq | Freq: Once | INTRAVENOUS | Status: AC
  Administered 2020-07-17: 50 meq via INTRAVENOUS
  Filled 2020-07-17: qty 50

## 2020-07-17 MED ORDER — SUMATRIPTAN SUCCINATE 50 MG PO TABS
50.0000 mg | ORAL_TABLET | ORAL | Status: DC | PRN
  Filled 2020-07-17: qty 1

## 2020-07-17 MED ORDER — DEXTROSE-NACL 5-0.9 % IV SOLN: INTRAVENOUS | Status: DC

## 2020-07-17 MED ORDER — POLYSACCHARIDE IRON COMPLEX 150 MG PO CAPS
150.0000 mg | ORAL_CAPSULE | Freq: Two times a day (BID) | ORAL | Status: DC
  Administered 2020-07-17 – 2020-07-19 (×4): 150 mg via ORAL
  Filled 2020-07-17 (×6): qty 1

## 2020-07-17 MED ORDER — PHENYLEPHRINE 40 MCG/ML (10ML) SYRINGE FOR IV PUSH (FOR BLOOD PRESSURE SUPPORT)
PREFILLED_SYRINGE | INTRAVENOUS | Status: DC | PRN
  Administered 2020-07-17: 40 ug via INTRAVENOUS

## 2020-07-17 MED ORDER — ESTRADIOL 1 MG PO TABS
1.0000 mg | ORAL_TABLET | Freq: Every day | ORAL | Status: DC
  Administered 2020-07-18 – 2020-07-19 (×2): 1 mg via ORAL
  Filled 2020-07-17 (×2): qty 1

## 2020-07-17 MED ORDER — VANCOMYCIN HCL 1000 MG/200ML IV SOLN
1000.0000 mg | Freq: Two times a day (BID) | INTRAVENOUS | Status: AC
  Administered 2020-07-17: 1000 mg via INTRAVENOUS
  Filled 2020-07-17: qty 200

## 2020-07-17 MED ORDER — CHLORHEXIDINE GLUCONATE CLOTH 2 % EX PADS: 6.0000 | MEDICATED_PAD | Freq: Every day | CUTANEOUS | Status: DC

## 2020-07-17 MED ORDER — CIPROFLOXACIN HCL 500 MG PO TABS
250.0000 mg | ORAL_TABLET | Freq: Two times a day (BID) | ORAL | Status: DC
  Administered 2020-07-17 – 2020-07-19 (×4): 250 mg via ORAL
  Filled 2020-07-17 (×4): qty 1

## 2020-07-17 MED ORDER — ALBUTEROL SULFATE (2.5 MG/3ML) 0.083% IN NEBU: 2.5000 mg | INHALATION_SOLUTION | Freq: Four times a day (QID) | RESPIRATORY_TRACT | Status: DC | PRN

## 2020-07-17 MED ORDER — SENNOSIDES-DOCUSATE SODIUM 8.6-50 MG PO TABS
1.0000 | ORAL_TABLET | Freq: Every day | ORAL | Status: DC
  Administered 2020-07-17: 1 via ORAL
  Filled 2020-07-17: qty 1

## 2020-07-17 MED ORDER — STERILE WATER FOR INJECTION IJ SOLN
INTRAMUSCULAR | Status: AC
  Filled 2020-07-17: qty 10

## 2020-07-17 MED ORDER — IPRATROPIUM-ALBUTEROL 0.5-2.5 (3) MG/3ML IN SOLN: 3.0000 mL | Freq: Four times a day (QID) | RESPIRATORY_TRACT | Status: DC | PRN

## 2020-07-17 MED ORDER — FENTANYL CITRATE (PF) 100 MCG/2ML IJ SOLN
25.0000 ug | INTRAMUSCULAR | Status: DC | PRN
  Administered 2020-07-17 (×2): 50 ug via INTRAVENOUS
  Filled 2020-07-17 (×2): qty 2

## 2020-07-17 MED ORDER — CHLORHEXIDINE GLUCONATE 0.12 % MT SOLN: 15.0000 mL | Freq: Once | OROMUCOSAL | Status: AC

## 2020-07-17 MED ORDER — DEXAMETHASONE SODIUM PHOSPHATE 10 MG/ML IJ SOLN
INTRAMUSCULAR | Status: AC
  Filled 2020-07-17: qty 1

## 2020-07-17 MED ORDER — ROCURONIUM BROMIDE 10 MG/ML (PF) SYRINGE
PREFILLED_SYRINGE | INTRAVENOUS | Status: DC | PRN
  Administered 2020-07-17: 20 mg via INTRAVENOUS
  Administered 2020-07-17: 90 mg via INTRAVENOUS
  Administered 2020-07-17: 30 mg via INTRAVENOUS

## 2020-07-17 MED ORDER — ACETAMINOPHEN 160 MG/5ML PO SOLN: 1000.0000 mg | Freq: Once | ORAL | Status: DC | PRN

## 2020-07-17 MED ORDER — BUSPIRONE HCL 15 MG PO TABS
30.0000 mg | ORAL_TABLET | Freq: Two times a day (BID) | ORAL | Status: DC
  Administered 2020-07-17 – 2020-07-19 (×4): 30 mg via ORAL
  Filled 2020-07-17 (×4): qty 2

## 2020-07-17 MED ORDER — ALPRAZOLAM 0.5 MG PO TABS
0.5000 mg | ORAL_TABLET | Freq: Two times a day (BID) | ORAL | Status: DC | PRN
  Administered 2020-07-17 – 2020-07-18 (×2): 0.5 mg via ORAL
  Filled 2020-07-17 (×2): qty 1

## 2020-07-17 MED ORDER — ALBUMIN HUMAN 5 % IV SOLN: INTRAVENOUS | Status: DC | PRN

## 2020-07-17 MED ORDER — ONDANSETRON HCL 4 MG/2ML IJ SOLN
INTRAMUSCULAR | Status: DC | PRN
  Administered 2020-07-17: 4 mg via INTRAVENOUS

## 2020-07-17 MED ORDER — ONDANSETRON HCL 4 MG/2ML IJ SOLN
INTRAMUSCULAR | Status: AC
  Filled 2020-07-17: qty 2

## 2020-07-17 MED ORDER — COLESEVELAM HCL 625 MG PO TABS
1250.0000 mg | ORAL_TABLET | Freq: Two times a day (BID) | ORAL | Status: DC
  Administered 2020-07-18 – 2020-07-19 (×3): 1250 mg via ORAL
  Filled 2020-07-17 (×4): qty 2

## 2020-07-17 MED ORDER — HYPROMELLOSE (GONIOSCOPIC) 2.5 % OP SOLN
1.0000 [drp] | Freq: Two times a day (BID) | OPHTHALMIC | Status: DC | PRN
  Filled 2020-07-17: qty 15

## 2020-07-17 MED ORDER — OXYCODONE HCL 5 MG PO TABS
5.0000 mg | ORAL_TABLET | Freq: Once | ORAL | Status: AC | PRN
  Administered 2020-07-17: 5 mg via ORAL

## 2020-07-17 MED ORDER — ONDANSETRON HCL 4 MG/2ML IJ SOLN: 4.0000 mg | Freq: Four times a day (QID) | INTRAMUSCULAR | Status: DC | PRN

## 2020-07-17 MED ORDER — LISINOPRIL 10 MG PO TABS
10.0000 mg | ORAL_TABLET | Freq: Every day | ORAL | Status: DC
  Administered 2020-07-18 – 2020-07-19 (×2): 10 mg via ORAL
  Filled 2020-07-17 (×2): qty 1

## 2020-07-17 MED ORDER — PROPOFOL 10 MG/ML IV BOLUS
INTRAVENOUS | Status: AC
  Filled 2020-07-17: qty 40

## 2020-07-17 MED ORDER — FENTANYL CITRATE (PF) 100 MCG/2ML IJ SOLN
INTRAMUSCULAR | Status: AC
  Filled 2020-07-17: qty 2

## 2020-07-17 MED ORDER — FENTANYL CITRATE (PF) 100 MCG/2ML IJ SOLN
INTRAMUSCULAR | Status: DC | PRN
  Administered 2020-07-17 (×3): 50 ug via INTRAVENOUS
  Administered 2020-07-17: 100 ug via INTRAVENOUS
  Administered 2020-07-17: 50 ug via INTRAVENOUS

## 2020-07-17 MED ORDER — FENTANYL CITRATE (PF) 100 MCG/2ML IJ SOLN
25.0000 ug | INTRAMUSCULAR | Status: DC | PRN
  Administered 2020-07-17 (×2): 50 ug via INTRAVENOUS

## 2020-07-17 MED ORDER — PANTOPRAZOLE SODIUM 40 MG PO TBEC
40.0000 mg | DELAYED_RELEASE_TABLET | Freq: Every day | ORAL | Status: DC
  Administered 2020-07-18 – 2020-07-19 (×2): 40 mg via ORAL
  Filled 2020-07-17 (×2): qty 1

## 2020-07-17 MED ORDER — ACETAMINOPHEN 160 MG/5ML PO SOLN: 1000.0000 mg | Freq: Four times a day (QID) | ORAL | Status: DC

## 2020-07-17 MED ORDER — ALBUTEROL SULFATE HFA 108 (90 BASE) MCG/ACT IN AERS
1.0000 | INHALATION_SPRAY | Freq: Four times a day (QID) | RESPIRATORY_TRACT | Status: DC | PRN
  Filled 2020-07-17: qty 6.7

## 2020-07-17 MED ORDER — LIDOCAINE 2% (20 MG/ML) 5 ML SYRINGE
INTRAMUSCULAR | Status: DC | PRN
  Administered 2020-07-17: 60 mg via INTRAVENOUS

## 2020-07-17 MED ORDER — PROGESTERONE MICRONIZED 100 MG PO CAPS
100.0000 mg | ORAL_CAPSULE | Freq: Every day | ORAL | Status: DC
  Administered 2020-07-17 – 2020-07-18 (×2): 100 mg via ORAL
  Filled 2020-07-17 (×4): qty 1

## 2020-07-17 MED ORDER — ORAL CARE MOUTH RINSE: 15.0000 mL | Freq: Once | OROMUCOSAL | Status: AC

## 2020-07-17 MED ORDER — LEVOTHYROXINE SODIUM 100 MCG PO TABS
100.0000 ug | ORAL_TABLET | Freq: Every day | ORAL | Status: DC
  Administered 2020-07-18 – 2020-07-19 (×2): 100 ug via ORAL
  Filled 2020-07-17 (×2): qty 1

## 2020-07-17 MED ORDER — BUPIVACAINE HCL (PF) 0.5 % IJ SOLN
INTRAMUSCULAR | Status: AC
  Filled 2020-07-17: qty 30

## 2020-07-17 MED ORDER — ACETAMINOPHEN 500 MG PO TABS
1000.0000 mg | ORAL_TABLET | Freq: Four times a day (QID) | ORAL | Status: DC
  Administered 2020-07-17 – 2020-07-19 (×6): 1000 mg via ORAL
  Filled 2020-07-17 (×7): qty 2

## 2020-07-17 MED ORDER — MIRTAZAPINE 7.5 MG PO TABS
30.0000 mg | ORAL_TABLET | Freq: Every day | ORAL | Status: DC
  Administered 2020-07-17 – 2020-07-18 (×2): 30 mg via ORAL
  Filled 2020-07-17 (×2): qty 4

## 2020-07-17 MED ORDER — MIDAZOLAM HCL 2 MG/2ML IJ SOLN
INTRAMUSCULAR | Status: DC | PRN
  Administered 2020-07-17: 2 mg via INTRAVENOUS

## 2020-07-17 MED ORDER — INDOCYANINE GREEN 25 MG IV SOLR
INTRAVENOUS | Status: AC
  Filled 2020-07-17: qty 10

## 2020-07-17 MED ORDER — ACETAMINOPHEN 10 MG/ML IV SOLN
1000.0000 mg | Freq: Once | INTRAVENOUS | Status: DC | PRN
  Administered 2020-07-17: 1000 mg via INTRAVENOUS

## 2020-07-17 MED ORDER — BUPIVACAINE LIPOSOME 1.3 % IJ SUSP
20.0000 mL | Freq: Once | INTRAMUSCULAR | Status: DC
  Filled 2020-07-17: qty 20

## 2020-07-17 MED ORDER — PROCHLORPERAZINE MALEATE 10 MG PO TABS
10.0000 mg | ORAL_TABLET | Freq: Four times a day (QID) | ORAL | Status: DC | PRN
  Filled 2020-07-17: qty 1

## 2020-07-17 MED ORDER — PROPOFOL 10 MG/ML IV BOLUS
INTRAVENOUS | Status: DC | PRN
  Administered 2020-07-17: 100 mg via INTRAVENOUS
  Administered 2020-07-17: 80 mg via INTRAVENOUS
  Administered 2020-07-17: 120 mg via INTRAVENOUS
  Administered 2020-07-17: 20 mg via INTRAVENOUS

## 2020-07-17 MED ORDER — LACTATED RINGERS IV SOLN: INTRAVENOUS | Status: DC

## 2020-07-17 MED ORDER — ADULT MULTIVITAMIN W/MINERALS CH
1.0000 | ORAL_TABLET | Freq: Every day | ORAL | Status: DC
  Administered 2020-07-18 – 2020-07-19 (×2): 1 via ORAL
  Filled 2020-07-17 (×2): qty 1

## 2020-07-17 MED ORDER — TRAMADOL HCL 50 MG PO TABS
50.0000 mg | ORAL_TABLET | Freq: Four times a day (QID) | ORAL | Status: DC | PRN
  Administered 2020-07-17 – 2020-07-18 (×3): 100 mg via ORAL
  Filled 2020-07-17 (×3): qty 2
  Filled 2020-07-17: qty 1

## 2020-07-17 SURGICAL SUPPLY — 155 items
ADAPTER BRONCHOSCOPE OLYMP 190 (ADAPTER) ×1 IMPLANT
ADAPTER BRONCHOSCOPE OLYMPUS (ADAPTER) ×3 IMPLANT
ADAPTER VALVE BIOPSY EBUS (MISCELLANEOUS) IMPLANT
ADH SKN CLS APL DERMABOND .7 (GAUZE/BANDAGES/DRESSINGS) ×2
ADPR BSCP OLMPS EDG (ADAPTER) ×2
ADPR BSCP STRL LF REUSE (ADAPTER) ×2
ADPTR VALVE BIOPSY EBUS (MISCELLANEOUS)
APPLIER CLIP ROT 10 11.4 M/L (STAPLE)
APR CLP MED LRG 11.4X10 (STAPLE)
BLADE CLIPPER SURG (BLADE) ×3 IMPLANT
BNDG COHESIVE 6X5 TAN STRL LF (GAUZE/BANDAGES/DRESSINGS) ×3 IMPLANT
BRUSH BIOPSY BRONCH 10 SDTNB (MISCELLANEOUS) IMPLANT
BRUSH SUPERTRAX BIOPSY (INSTRUMENTS) IMPLANT
BRUSH SUPERTRAX NDL-TIP CYTO (INSTRUMENTS) ×3 IMPLANT
CANISTER SUCT 3000ML PPV (MISCELLANEOUS) ×6 IMPLANT
CANNULA REDUC XI 12-8 STAPL (CANNULA) ×6
CANNULA REDUCER 12-8 DVNC XI (CANNULA) ×4 IMPLANT
CATH THORACIC 28FR (CATHETERS) IMPLANT
CATH THORACIC 28FR RT ANG (CATHETERS) IMPLANT
CATH THORACIC 36FR (CATHETERS) IMPLANT
CATH THORACIC 36FR RT ANG (CATHETERS) IMPLANT
CLIP APPLIE ROT 10 11.4 M/L (STAPLE) IMPLANT
CLIP VESOCCLUDE MED 6/CT (CLIP) IMPLANT
CNTNR URN SCR LID CUP LEK RST (MISCELLANEOUS) ×10 IMPLANT
CONN ST 1/4X3/8  BEN (MISCELLANEOUS)
CONN ST 1/4X3/8 BEN (MISCELLANEOUS) IMPLANT
CONN Y 3/8X3/8X3/8  BEN (MISCELLANEOUS)
CONN Y 3/8X3/8X3/8 BEN (MISCELLANEOUS) IMPLANT
CONT SPEC 4OZ STRL OR WHT (MISCELLANEOUS) ×15
COVER BACK TABLE 60X90IN (DRAPES) ×3 IMPLANT
DEFOGGER SCOPE WARMER CLEARIFY (MISCELLANEOUS) ×3 IMPLANT
DERMABOND ADVANCED (GAUZE/BANDAGES/DRESSINGS) ×1
DERMABOND ADVANCED .7 DNX12 (GAUZE/BANDAGES/DRESSINGS) ×2 IMPLANT
DRAIN CHANNEL 28F RND 3/8 FF (WOUND CARE) ×1 IMPLANT
DRAIN CHANNEL 32F RND 10.7 FF (WOUND CARE) IMPLANT
DRAPE ARM DVNC X/XI (DISPOSABLE) ×8 IMPLANT
DRAPE COLUMN DVNC XI (DISPOSABLE) ×2 IMPLANT
DRAPE CV SPLIT W-CLR ANES SCRN (DRAPES) ×3 IMPLANT
DRAPE DA VINCI XI ARM (DISPOSABLE) ×12
DRAPE DA VINCI XI COLUMN (DISPOSABLE) ×3
DRAPE INCISE IOBAN 66X45 STRL (DRAPES) IMPLANT
DRAPE ORTHO SPLIT 77X108 STRL (DRAPES) ×3
DRAPE SURG ORHT 6 SPLT 77X108 (DRAPES) ×2 IMPLANT
ELECT BLADE 6.5 EXT (BLADE) ×3 IMPLANT
ELECT REM PT RETURN 9FT ADLT (ELECTROSURGICAL) ×3
ELECTRODE REM PT RTRN 9FT ADLT (ELECTROSURGICAL) ×2 IMPLANT
FILTER STRAW FLUID ASPIR (MISCELLANEOUS) IMPLANT
FORCEPS BIOP SUPERTRX PREMAR (INSTRUMENTS) IMPLANT
GAUZE KITTNER 4X5 RF (MISCELLANEOUS) ×8 IMPLANT
GAUZE SPONGE 4X4 12PLY STRL (GAUZE/BANDAGES/DRESSINGS) ×3 IMPLANT
GLOVE SURG SIGNA 7.5 PF LTX (GLOVE) ×3 IMPLANT
GLOVE SURG SS PI 8.0 STRL IVOR (GLOVE) ×3 IMPLANT
GLOVE SURG SYN 7.5  E (GLOVE)
GLOVE SURG SYN 7.5 E (GLOVE) IMPLANT
GLOVE SURG SYN 7.5 PF PI (GLOVE) IMPLANT
GLOVE TRIUMPH SURG SIZE 7.5 (KITS) ×6 IMPLANT
GOWN STRL REUS W/ TWL LRG LVL3 (GOWN DISPOSABLE) ×4 IMPLANT
GOWN STRL REUS W/ TWL XL LVL3 (GOWN DISPOSABLE) ×6 IMPLANT
GOWN STRL REUS W/TWL 2XL LVL3 (GOWN DISPOSABLE) ×6 IMPLANT
GOWN STRL REUS W/TWL LRG LVL3 (GOWN DISPOSABLE) ×6
GOWN STRL REUS W/TWL XL LVL3 (GOWN DISPOSABLE) ×9
HEMOSTAT SURGICEL 2X14 (HEMOSTASIS) ×9 IMPLANT
IRRIGATION STRYKERFLOW (MISCELLANEOUS) ×2 IMPLANT
IRRIGATOR STRYKERFLOW (MISCELLANEOUS) ×3
KIT BASIN OR (CUSTOM PROCEDURE TRAY) ×6 IMPLANT
KIT CLEAN ENDO COMPLIANCE (KITS) ×3 IMPLANT
KIT ILLUMISITE 180 PROCEDURE (KITS) ×1 IMPLANT
KIT ILLUMISITE 90 PROCEDURE (KITS) IMPLANT
KIT SUCTION CATH 14FR (SUCTIONS) IMPLANT
KIT TURNOVER KIT B (KITS) ×3 IMPLANT
LOOP VESSEL SUPERMAXI WHITE (MISCELLANEOUS) IMPLANT
MARKER SKIN DUAL TIP RULER LAB (MISCELLANEOUS) ×3 IMPLANT
NDL HYPO 25GX1X1/2 BEV (NEEDLE) ×2 IMPLANT
NDL SPNL 22GX3.5 QUINCKE BK (NEEDLE) ×2 IMPLANT
NDL SUPERTRX PREMARK BIOPSY (NEEDLE) IMPLANT
NEEDLE HYPO 25GX1X1/2 BEV (NEEDLE) ×3 IMPLANT
NEEDLE SPNL 22GX3.5 QUINCKE BK (NEEDLE) ×3 IMPLANT
NEEDLE SUPERTRX PREMARK BIOPSY (NEEDLE) ×3 IMPLANT
NS IRRIG 1000ML POUR BTL (IV SOLUTION) ×6 IMPLANT
OBTURATOR OPTICAL STANDARD 8MM (TROCAR)
OBTURATOR OPTICAL STND 8 DVNC (TROCAR)
OBTURATOR OPTICALSTD 8 DVNC (TROCAR) IMPLANT
OIL SILICONE PENTAX (PARTS (SERVICE/REPAIRS)) ×3 IMPLANT
PACK CHEST (CUSTOM PROCEDURE TRAY) ×3 IMPLANT
PAD ARMBOARD 7.5X6 YLW CONV (MISCELLANEOUS) ×6 IMPLANT
PATCHES PATIENT (LABEL) ×9 IMPLANT
PORT ACCESS TROCAR AIRSEAL 12 (TROCAR) ×2 IMPLANT
PORT ACCESS TROCAR AIRSEAL 5M (TROCAR) ×1
RELOAD STAPLE 45 3.5 BLU DVNC (STAPLE) IMPLANT
RELOAD STAPLE 45 4.3 GRN DVNC (STAPLE) IMPLANT
RELOAD STAPLER 3.5X45 BLU DVNC (STAPLE) ×6 IMPLANT
RELOAD STAPLER 4.3X45 GRN DVNC (STAPLE) ×4 IMPLANT
SCISSORS LAP 5X35 DISP (ENDOMECHANICALS) IMPLANT
SEAL CANN UNIV 5-8 DVNC XI (MISCELLANEOUS) ×4 IMPLANT
SEAL XI 5MM-8MM UNIVERSAL (MISCELLANEOUS) ×6
SEALANT PROGEL (MISCELLANEOUS) IMPLANT
SEALANT SURG COSEAL 4ML (VASCULAR PRODUCTS) IMPLANT
SEALANT SURG COSEAL 8ML (VASCULAR PRODUCTS) IMPLANT
SET TRI-LUMEN FLTR TB AIRSEAL (TUBING) ×3 IMPLANT
SET TUBE SMOKE EVAC HIGH FLOW (TUBING) ×3 IMPLANT
SHEARS HARMONIC HDI 20CM (ELECTROSURGICAL) IMPLANT
SOLUTION ELECTROLUBE (MISCELLANEOUS) ×3 IMPLANT
SPECIMEN JAR MEDIUM (MISCELLANEOUS) IMPLANT
SPONGE INTESTINAL PEANUT (DISPOSABLE) IMPLANT
SPONGE TONSIL TAPE 1 RFD (DISPOSABLE) IMPLANT
STAPLER 45 SUREFORM CVD (STAPLE) ×3
STAPLER 45 SUREFORM CVD DVNC (STAPLE) IMPLANT
STAPLER CANNULA SEAL DVNC XI (STAPLE) ×4 IMPLANT
STAPLER CANNULA SEAL XI (STAPLE) ×6
STAPLER RELOAD 3.5X45 BLU DVNC (STAPLE) ×6
STAPLER RELOAD 3.5X45 BLUE (STAPLE) ×9
STAPLER RELOAD 4.3X45 GREEN (STAPLE) ×6
STAPLER RELOAD 4.3X45 GRN DVNC (STAPLE) ×4
SUT PDS AB 3-0 SH 27 (SUTURE) IMPLANT
SUT PROLENE 4 0 RB 1 (SUTURE)
SUT PROLENE 4-0 RB1 .5 CRCL 36 (SUTURE) IMPLANT
SUT SILK  1 MH (SUTURE) ×6
SUT SILK 1 MH (SUTURE) ×4 IMPLANT
SUT SILK 1 TIES 10X30 (SUTURE) ×3 IMPLANT
SUT SILK 2 0 SH (SUTURE) ×3 IMPLANT
SUT SILK 2 0SH CR/8 30 (SUTURE) IMPLANT
SUT SILK 3 0SH CR/8 30 (SUTURE) IMPLANT
SUT V-LOC BARB 180 2/0GR6 GS22 (SUTURE) ×6
SUT VIC AB 1 CTX 36 (SUTURE) ×3
SUT VIC AB 1 CTX36XBRD ANBCTR (SUTURE) ×2 IMPLANT
SUT VIC AB 2-0 CTX 36 (SUTURE) ×3 IMPLANT
SUT VIC AB 3-0 MH 27 (SUTURE) IMPLANT
SUT VIC AB 3-0 X1 27 (SUTURE) ×6 IMPLANT
SUT VICRYL 0 TIES 12 18 (SUTURE) ×3 IMPLANT
SUT VICRYL 0 UR6 27IN ABS (SUTURE) ×6 IMPLANT
SUT VICRYL 2 TP 1 (SUTURE) IMPLANT
SUTURE V-LC BRB 180 2/0GR6GS22 (SUTURE) IMPLANT
SYR 20CC LL (SYRINGE) ×6 IMPLANT
SYR 20ML ECCENTRIC (SYRINGE) ×3 IMPLANT
SYR 20ML LL LF (SYRINGE) ×6 IMPLANT
SYR 30ML LL (SYRINGE) ×3 IMPLANT
SYR 5ML LL (SYRINGE) ×3 IMPLANT
SYR TB 1ML LUER SLIP (SYRINGE) IMPLANT
SYSTEM SAHARA CHEST DRAIN ATS (WOUND CARE) ×3 IMPLANT
TAPE CLOTH 4X10 WHT NS (GAUZE/BANDAGES/DRESSINGS) ×3 IMPLANT
TAPE CLOTH SURG 4X10 WHT LF (GAUZE/BANDAGES/DRESSINGS) ×1 IMPLANT
TIP APPLICATOR SPRAY EXTEND 16 (VASCULAR PRODUCTS) IMPLANT
TOWEL GREEN STERILE (TOWEL DISPOSABLE) ×6 IMPLANT
TOWEL GREEN STERILE FF (TOWEL DISPOSABLE) ×3 IMPLANT
TRAP SPECIMEN MUCUS 40CC (MISCELLANEOUS) ×3 IMPLANT
TRAY FOLEY MTR SLVR 16FR STAT (SET/KITS/TRAYS/PACK) ×3 IMPLANT
TROCAR BLADELESS 15MM (ENDOMECHANICALS) IMPLANT
TROCAR XCEL 12X100 BLDLESS (ENDOMECHANICALS) ×3 IMPLANT
TROCAR XCEL BLADELESS 5X75MML (TROCAR) IMPLANT
TUBE CONNECTING 20X1/4 (TUBING) ×6 IMPLANT
UNDERPAD 30X36 HEAVY ABSORB (UNDERPADS AND DIAPERS) ×3 IMPLANT
VALVE BIOPSY  SINGLE USE (MISCELLANEOUS) ×3
VALVE BIOPSY SINGLE USE (MISCELLANEOUS) ×2 IMPLANT
VALVE SUCTION BRONCHIO DISP (MISCELLANEOUS) ×3 IMPLANT
WATER STERILE IRR 1000ML POUR (IV SOLUTION) ×3 IMPLANT

## 2020-07-17 NOTE — Transfer of Care (Signed)
Immediate Anesthesia Transfer of Care Note  Patient: [name]  Procedure(s) Performed: XI ROBOTIC ASSISTED RIGHTTHORASCOPY-WEDGE RESECTION (Right Chest) VIDEO BRONCHOSCOPY WITH ENDOBRONCHIAL NAVIGATION FOR TUMOR MARKING (N/A Bronchus) INTERCOSTAL NERVE BLOCK RIGHT (Right Chest) LYMPH NODE DISSECTION RIGHT (Right Chest)  Patient Location: PACU  Anesthesia Type:General  Level of Consciousness: awake, alert  and oriented  Airway & Oxygen Therapy: Patient Spontanous Breathing and Patient connected to face mask oxygen  Post-op Assessment: Report given to RN, Post -op Vital signs reviewed and stable and Patient moving all extremities  Post vital signs: Reviewed and stable  Last Vitals:  Vitals Value Taken Time  BP 176/102 07/17/20 1214  Temp    Pulse 78 07/17/20 1223  Resp 15 07/17/20 1223  SpO2 99 % 07/17/20 1223  Vitals shown include unvalidated device data.  Last Pain:  Vitals:   07/17/20 0615  TempSrc: Oral  PainSc: 0-No pain      Patients Stated Pain Goal: 4 (78/67/67 2094)  Complications: No complications documented.

## 2020-07-17 NOTE — Anesthesia Procedure Notes (Signed)
Procedure Name: Intubation Date/Time: 07/17/2020 8:21 AM Performed by: Leonor Liv, CRNA Pre-anesthesia Checklist: Patient identified, Emergency Drugs available, Suction available and Patient being monitored Patient Re-evaluated:Patient Re-evaluated prior to induction Oxygen Delivery Method: Circle System Utilized Preoxygenation: Pre-oxygenation with 100% oxygen Induction Type: IV induction Ventilation: Mask ventilation without difficulty Laryngoscope Size: Glidescope and 3 Tube type: Oral Tube size: 8.5 mm Number of attempts: 1 Airway Equipment and Method: Stylet Placement Confirmation: ETT inserted through vocal cords under direct vision,  positive ETCO2 and breath sounds checked- equal and bilateral Secured at: 21 cm Tube secured with: Tape Dental Injury: Teeth and Oropharynx as per pre-operative assessment

## 2020-07-17 NOTE — Anesthesia Procedure Notes (Signed)
Arterial Line Insertion Start/End3/07/2020 7:00 AM, 07/17/2020 7:10 AM Performed by: Leonor Liv, CRNA, CRNA  Patient location: Pre-op. Preanesthetic checklist: patient identified, IV checked, site marked, risks and benefits discussed, surgical consent, monitors and equipment checked, pre-op evaluation, timeout performed and anesthesia consent Lidocaine 1% used for infiltration Left, radial was placed Catheter size: 20 G  Attempts: 1 Procedure performed without using ultrasound guided technique. Following insertion, Biopatch and dressing applied. Post procedure assessment: normal  Patient tolerated the procedure well with no immediate complications.

## 2020-07-17 NOTE — Interval H&P Note (Signed)
History and Physical Interval Note:  07/17/2020 7:39 AM  [name]  has presented today for surgery, with the diagnosis of RUL NODULE.  The various methods of treatment have been discussed with the patient and family. After consideration of risks, benefits and other options for treatment, the patient has consented to  Procedure(s): XI ROBOTIC ASSISTED THORASCOPY-WEDGE RESECTION (Right) possible XI ROBOTIC ASSISTED THORASCOPY-LOBECTOMY (Right) VIDEO BRONCHOSCOPY WITH ENDOBRONCHIAL NAVIGATION FOR TUMOR MARKING (N/A) as a surgical intervention.  The patient's history has been reviewed, patient examined, no change in status, stable for surgery.  I have reviewed the patient's chart and labs.  Questions were answered to the patient's satisfaction.     Melrose Nakayama

## 2020-07-17 NOTE — Progress Notes (Signed)
      [address]       [place] [address]             [phone]       Spoke with pharmacy about two post-op medications:   Cipro 250mg  BID-it appears this has been started for chronic UTIs but was written as a 10 day course originally. The question was if this patient really needs to continue this medication at this time. We should give the evening dose and address in the morning.   Lovenox 40mg  at 10:00am- I think this will be fine to give even though it wont technically be 24 hours since surgery. We can also address in the morning and adjust as needed.   No medication changes were made at this time.   Nicholes Rough, PA-C

## 2020-07-17 NOTE — Anesthesia Preprocedure Evaluation (Signed)
Anesthesia Evaluation  Patient identified by MRN, date of birth, ID band Patient awake    Reviewed: Allergy & Precautions, NPO status , Patient's Chart, lab work & pertinent test results  History of Anesthesia Complications Negative for: history of anesthetic complications  Airway Mallampati: II  TM Distance: >3 FB Neck ROM: Full    Dental  (+) Dental Advisory Given, Teeth Intact   Pulmonary asthma ,    breath sounds clear to auscultation       Cardiovascular hypertension, Pt. on medications  Rhythm:Regular  Left ventricle: The cavity size was normal. Wall thickness was  normal. Systolic function was normal. The estimated ejection  fraction was in the range of 60% to 65%. Wall motion was normal;  there were no regional wall motion abnormalities. Left  ventricular diastolic function parameters were normal.  - Aortic valve: There was no stenosis.  - Mitral valve: There was trivial regurgitation.  - Right ventricle: The cavity size was normal. Systolic function  was normal.  - Tricuspid valve: Peak RV-RA gradient (S): 24 mm Hg.  - Pulmonary arteries: PA peak pressure: 27 mm Hg (S).  - Inferior vena cava: The vessel was normal in size. The  respirophasic diameter changes were in the normal range (= 50%),  consistent with normal central venous pressure.   2019 cath:  1. No angiographic evidence of CAD 2. Normal right and left heart pressures.     Neuro/Psych  Headaches, PSYCHIATRIC DISORDERS Anxiety Depression Bipolar Disorder Dementia    GI/Hepatic GERD  Medicated,Lab Results      Component                Value               Date                      ALT                      27                  07/15/2020                AST                      30                  07/15/2020                ALKPHOS                  98                  07/15/2020                BILITOT                  0.8                  07/15/2020              Endo/Other  Hypothyroidism Lab Results      Component                Value               Date                      HGBA1C  5.5                 06/27/2017             Renal/GU CRFRenal diseaseChronic stage 3  Lab Results      Component                Value               Date                      CREATININE               1.55 (H)            07/15/2020           Lab Results      Component                Value               Date                      K                        4.5                 07/15/2020                Musculoskeletal  (+) Arthritis , Fibromyalgia -  Abdominal   Peds  Hematology negative hematology ROS (+) Lab Results      Component                Value               Date                      WBC                      6.3                 07/15/2020                HGB                      12.3                07/15/2020                HCT                      38.1                07/15/2020                MCV                      96.0                07/15/2020                PLT                      332                 07/15/2020  Anesthesia Other Findings   Reproductive/Obstetrics                             Anesthesia Physical Anesthesia Plan  ASA: III  Anesthesia Plan: General   Post-op Pain Management:    Induction:   PONV Risk Score and Plan: 3 and Ondansetron, Dexamethasone and Propofol infusion  Airway Management Planned: Double Lumen EBT  Additional Equipment: Arterial line  Intra-op Plan:   Post-operative Plan: Extubation in OR  Informed Consent: I have reviewed the patients History and Physical, chart, labs and discussed the procedure including the risks, benefits and alternatives for the proposed anesthesia with the patient or authorized representative who has indicated his/her understanding and acceptance.     Dental advisory given  Plan Discussed with:  CRNA and Surgeon  Anesthesia Plan Comments:         Anesthesia Quick Evaluation

## 2020-07-17 NOTE — Progress Notes (Signed)
      [address]       [place] [address]             [phone]      S/p wedge resection RUL nodule  Resting in bed. Denies pain BP 124/83 (BP Location: Right Arm)   Pulse 76   Temp 97.7 F (36.5 C)   Resp 14   SpO2 94%  About 150 ml of serosanguinous drainage in Pleuravac  Doing well early postop  [name] C. Roxan Hockey, MD [facility] [phone]

## 2020-07-17 NOTE — Anesthesia Procedure Notes (Signed)
Procedure Name: Intubation Date/Time: 07/17/2020 9:08 AM Performed by: Leonor Liv, CRNA Pre-anesthesia Checklist: Patient identified, Emergency Drugs available, Suction available and Patient being monitored Patient Re-evaluated:Patient Re-evaluated prior to induction Oxygen Delivery Method: Circle System Utilized Preoxygenation: Pre-oxygenation with 100% oxygen Induction Type: IV induction Ventilation: Mask ventilation without difficulty Tube type: Oral (Ambu Vivasight) Endobronchial tube: Left, Double lumen EBT and EBT position confirmed by fiberoptic bronchoscope and 35 Fr Number of attempts: 1 Airway Equipment and Method: Stylet and Oral airway Placement Confirmation: ETT inserted through vocal cords under direct vision,  positive ETCO2 and breath sounds checked- equal and bilateral Secured at: 27 cm Tube secured with: Tape Dental Injury: Teeth and Oropharynx as per pre-operative assessment

## 2020-07-17 NOTE — Brief Op Note (Addendum)
07/17/2020  11:47 AM  PATIENT:  [name]  70 y.o. 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  PROCEDURE:  Procedure(s): XI ROBOTIC ASSISTED RIGHTTHORASCOPY-WEDGE RESECTION (Right) VIDEO BRONCHOSCOPY WITH ENDOBRONCHIAL NAVIGATION FOR TUMOR MARKING (N/A) INTERCOSTAL NERVE BLOCK RIGHT (Right) LYMPH NODE DISSECTION RIGHT (Right)  SURGEON:  Surgeon(s) and Role:    * Melrose Nakayama, MD - Primary  PHYSICIAN ASSISTANT: WAYNE GOLD PA-C  ANESTHESIA:   general  EBL:  400 mL   BLOOD ADMINISTERED:none  DRAINS: (1 40 F) Blake drain(s) in the RIGHT HEMITHORAX   LOCAL MEDICATIONS USED:   EXPAREL  SPECIMEN:  Source of Specimen:  RIGHT UPPER LOBE WEDGE AND LN SAMPLES  DISPOSITION OF SPECIMEN:  PATHOLOGY  COUNTS:  YES  TOURNIQUET:  * No tourniquets in log *  DICTATION: .Other Dictation: Dictation Number [id number]  PLAN OF CARE: Admit to inpatient   PATIENT DISPOSITION:  PACU - hemodynamically stable.   Delay start of Pharmacological VTE agent (>24hrs) due to surgical blood loss or risk of bleeding: yes  FROZEN: SPINDLE CELL TUMOR

## 2020-07-18 ENCOUNTER — Inpatient Hospital Stay (HOSPITAL_COMMUNITY): Payer: Medicare Other

## 2020-07-18 ENCOUNTER — Encounter (HOSPITAL_COMMUNITY): Payer: Self-pay | Admitting: Thoracic Surgery (Cardiothoracic Vascular Surgery)

## 2020-07-18 LAB — TYPE AND SCREEN
ABO/RH(D): O POS
Antibody Screen: NEGATIVE
Unit division: 0

## 2020-07-18 LAB — CBC
HCT: 27.5 % — ABNORMAL LOW (ref 36.0–46.0)
Hemoglobin: 9 g/dL — ABNORMAL LOW (ref 12.0–15.0)
MCH: 31 pg (ref 26.0–34.0)
MCHC: 32.7 g/dL (ref 30.0–36.0)
MCV: 94.8 fL (ref 80.0–100.0)
Platelets: 227 10*3/uL (ref 150–400)
RBC: 2.9 MIL/uL — ABNORMAL LOW (ref 3.87–5.11)
RDW: 13.8 % (ref 11.5–15.5)
WBC: 9.6 10*3/uL (ref 4.0–10.5)
nRBC: 0 % (ref 0.0–0.2)

## 2020-07-18 LAB — BASIC METABOLIC PANEL
Anion gap: 10 (ref 5–15)
BUN: 25 mg/dL — ABNORMAL HIGH (ref 8–23)
CO2: 18 mmol/L — ABNORMAL LOW (ref 22–32)
Calcium: 8.7 mg/dL — ABNORMAL LOW (ref 8.9–10.3)
Chloride: 106 mmol/L (ref 98–111)
Creatinine, Ser: 1.5 mg/dL — ABNORMAL HIGH (ref 0.44–1.00)
GFR, Estimated: 37 mL/min — ABNORMAL LOW (ref >60)
Glucose, Bld: 124 mg/dL — ABNORMAL HIGH (ref 70–99)
Potassium: 4.7 mmol/L (ref 3.5–5.1)
Sodium: 134 mmol/L — ABNORMAL LOW (ref 135–145)

## 2020-07-18 LAB — BPAM RBC
Blood Product Expiration Date: 202204012359
ISSUE DATE / TIME: 202203031119
Unit Type and Rh: 5100

## 2020-07-18 NOTE — Hospital Course (Signed)
    HPI: At time of surgical evaluation.  Dr. Redmond Baseman for family's   Raejean [name] is a 70 year old non-smoker with a history of leiomyosarcoma of the pelvis with recurrence, asthma, hyperlipidemia, reflux, irritable bowel syndrome, hypothyroidism, anxiety, depression, bipolar, chronic UTIs, multiple concussions, fibromyalgia, vitamin D deficiency, syncope, migraines, and degenerative disc disease.   She has been followed with CTs and PET CTs for years now.  She had a known calcified granuloma in the right upper lobe.  On PET CT in May there was a new soft tissue component associated with that calcified granuloma.  A recent PET/CT showed an increase in size and metabolic activity in the nodule.   She was seen  in December.  She initially was adamant about having the nodule resected.  We had planned to do a navigational bronchoscopy to mark the nodule followed by a robotic wedge resection and possible lobectomy depending on intraoperative frozen section.   She then decided she did not want surgery and only wanted a navigational bronchoscopy, even understanding that there was a good chance we would not get a definitive diagnosis.  I did that procedure on 06/16/2020.  It was uncomplicated.  Needle aspirations and brushings were nondiagnostic.  She was then rescheduled for further procedure a endobronchial navigation for tumor marking and then robotic right VATS for wedge resection and possible lobectomy.  She was admitted this hospitalization for the procedure.  Hospital course: Patient was admitted on 07/17/2020 and taken the operating room And underwent robotic right thoracoscopy with right upper lobe wedge resection.  Additionally lymph node dissection and intercostal nerve block.  Frozen section revealed spindle cell tumor and therefore it was not felt she would require full lobectomy.  She tolerated the procedure well and was taken to the postanesthesia care unit in stable condition.  She did receive a  unit of blood.   Postoperative hospital course:  The patient has done very well.  She has maintained stable vital signs with some early hypertension that is improved.  Oxygen has been weaned and she maintains good saturations on room air.  On postop day 1 chest tube had minimal drainage and was removed.  Chest x-ray revealed no evidence of pneumothorax or significant effusion/infiltrate/atelectasis.  She does have a slightly elevated chronic renal insufficiency which is stable.  Her expected acute blood loss anemia is stable.

## 2020-07-18 NOTE — Progress Notes (Addendum)
1 Day Post-Op Procedure(s) (LRB): XI ROBOTIC ASSISTED RIGHTTHORASCOPY-WEDGE RESECTION (Right) VIDEO BRONCHOSCOPY WITH ENDOBRONCHIAL NAVIGATION FOR TUMOR MARKING (N/A) INTERCOSTAL NERVE BLOCK RIGHT (Right) LYMPH NODE DISSECTION RIGHT (Right) Subjective: Feels well. Some soreness  Objective: Vital signs in last 24 hours: Temp:  [97.7 F (36.5 C)-98.4 F (36.9 C)] 98.3 F (36.8 C) (03/04 0300) Pulse Rate:  [69-89] 69 (03/04 0300) Cardiac Rhythm: Normal sinus rhythm (03/03 2025) Resp:  [11-19] 15 (03/04 0300) BP: (99-176)/(62-102) 99/63 (03/04 0300) SpO2:  [92 %-100 %] 93 % (03/04 0300) Arterial Line BP: (128-184)/(65-97) 131/73 (03/03 2326)  Hemodynamic parameters for last 24 hours:    Intake/Output from previous day: 03/03 0701 - 03/04 0700 In: 3715 [I.V.:2900; Blood:315; IV Piggyback:500] Out: 2114 [Urine:1660; Blood:400; Chest Tube:54] Intake/Output this shift: No intake/output data recorded.  General appearance: alert, cooperative and no distress Heart: regular rate and rhythm Lungs: clear to auscultation bilaterally Abdomen: benign Extremities: no edema Wound: incis ok  Lab Results: Recent Labs    07/15/20 1325 07/17/20 1029 07/17/20 1110 07/18/20 0125  WBC 6.3  --   --  9.6  HGB 12.3   < > 8.2* 9.0*  HCT 38.1   < > 24.0* 27.5*  PLT 332  --   --  227   < > = values in this interval not displayed.   BMET:  Recent Labs    07/15/20 1325 07/17/20 1029 07/17/20 1110 07/18/20 0125  NA 136   < > 138 134*  K 4.5   < > 4.9 4.7  CL 107  --   --  106  CO2 17*  --   --  18*  GLUCOSE 108*  --   --  124*  BUN 30*  --   --  25*  CREATININE 1.55*  --   --  1.50*  CALCIUM 10.4*  --   --  8.7*   < > = values in this interval not displayed.    PT/INR:  Recent Labs    07/15/20 1325  LABPROT 12.3  INR 1.0   ABG    Component Value Date/Time   PHART 7.258 (L) 07/17/2020 1300   HCO3 16.1 (L) 07/17/2020 1300   TCO2 20 (L) 07/17/2020 1110   ACIDBASEDEF 9.8 (H)  07/17/2020 1300   O2SAT 92.2 07/17/2020 1300   CBG (last 3)  No results for input(s): GLUCAP in the last 72 hours.  Meds Scheduled Meds: . acetaminophen  1,000 mg Oral Q6H   Or  . acetaminophen (TYLENOL) oral liquid 160 mg/5 mL  1,000 mg Oral Q6H  . bisacodyl  10 mg Oral Daily  . busPIRone  30 mg Oral BID  . Chlorhexidine Gluconate Cloth  6 each Topical Daily  . ciprofloxacin  250 mg Oral BID  . colesevelam  1,250 mg Oral BID WC  . enoxaparin (LOVENOX) injection  40 mg Subcutaneous Daily  . estradiol  1 mg Oral Daily  . FLUoxetine  40 mg Oral q morning  . iron polysaccharides  150 mg Oral BID  . levothyroxine  100 mcg Oral QAC breakfast  . lisinopril  10 mg Oral Daily  . mirtazapine  30 mg Oral QHS  . multivitamin with minerals  1 tablet Oral Daily  . pantoprazole  40 mg Oral Daily  . progesterone  100 mg Oral QHS  . senna-docusate  1 tablet Oral QHS  . triamterene-hydrochlorothiazide  1 tablet Oral Daily   Continuous Infusions: . dextrose 5 % and 0.9% NaCl 100 mL/hr at  07/17/20 1840   PRN Meds:.acetaminophen, albuterol, albuterol, ALPRAZolam, fentaNYL (SUBLIMAZE) injection, hydroxypropyl methylcellulose / hypromellose, ipratropium-albuterol, ondansetron (ZOFRAN) IV, ondansetron, prochlorperazine, SUMAtriptan, traMADol  Xrays DG CHEST PORT 1 VIEW  Result Date: 07/18/2020 CLINICAL DATA:  Chest tube, pulmonary mass EXAM: PORTABLE CHEST 1 VIEW COMPARISON:  07/17/2020 FINDINGS: Right large bore chest tube is unchanged. Surgical staple line and focal opacity noted at the right apex, stable. The lungs are otherwise clear. No pneumothorax or pleural effusion. Cardiac size within normal limits. Pulmonary vascularity is normal. IMPRESSION: Stable examination.  Right chest tube in place.  No pneumothorax. Electronically Signed   By: Fidela Salisbury MD   On: 07/18/2020 06:57   DG CHEST PORT 1 VIEW  Result Date: 07/17/2020 CLINICAL DATA:  Right lung mass postop pain EXAM: PORTABLE CHEST 1  VIEW COMPARISON:  07/15/2020 FINDINGS: Right chest tube in good position. Apparent right upper lobectomy. Right upper lobe staples and mild airspace disease. No pneumothorax. Mild gas in the soft tissues above the right lung apex. No pleural effusion. Mild bibasilar atelectasis. IMPRESSION: Right chest tube in good position. No pneumothorax. Postop right upper lobe resection. Mild bibasilar atelectasis. Electronically Signed   By: Franchot Gallo M.D.   On: 07/17/2020 13:36   DG C-ARM BRONCHOSCOPY  Result Date: 07/17/2020 C-ARM BRONCHOSCOPY: Fluoroscopy was utilized by the requesting physician.  No radiographic interpretation.    Assessment/Plan: S/P Procedure(s) (LRB): XI ROBOTIC ASSISTED RIGHTTHORASCOPY-WEDGE RESECTION (Right) VIDEO BRONCHOSCOPY WITH ENDOBRONCHIAL NAVIGATION FOR TUMOR MARKING (N/A) INTERCOSTAL NERVE BLOCK RIGHT (Right) LYMPH NODE DISSECTION RIGHT (Right)   1 afeb, VSS with some HTN, improved trend over last day 2 sats ok on RA 2 CT - only 54 cc 4 CXR - stable, no pntx 5 creat around preop, avoiding nephrotoxic drugs 6 Expected ABLA- stable and improved appropriately after transfusion 7 d/c CT , aline , reduce IVF, remove foley 8 routine pulm toilet and rehab     LOS: 1 day    John Giovanni PA-C Pager [phone] 07/18/2020 Patient seen and examined, agree with above Doing well  No air leak and minimal output from CT, CXR looks good- dc chest tube  [name] C. Roxan Hockey, MD [facility] [phone]

## 2020-07-18 NOTE — Plan of Care (Signed)
  Problem: Education: Goal: Knowledge of General Education information will improve Description: Including pain rating scale, medication(s)/side effects and non-pharmacologic comfort measures Outcome: Progressing   Problem: Health Behavior/Discharge Planning: Goal: Ability to manage health-related needs will improve Outcome: Progressing   Problem: Clinical Measurements: Goal: Ability to maintain clinical measurements within normal limits will improve Outcome: Progressing Goal: Will remain free from infection Outcome: Progressing Goal: Diagnostic test results will improve Outcome: Progressing Goal: Respiratory complications will improve Outcome: Progressing Goal: Cardiovascular complication will be avoided Outcome: Progressing   Problem: Activity: Goal: Risk for activity intolerance will decrease Outcome: Progressing   Problem: Nutrition: Goal: Adequate nutrition will be maintained Outcome: Progressing   Problem: Coping: Goal: Level of anxiety will decrease Outcome: Progressing   Problem: Elimination: Goal: Will not experience complications related to bowel motility Outcome: Progressing Goal: Will not experience complications related to urinary retention Outcome: Progressing   Problem: Pain Managment: Goal: General experience of comfort will improve Outcome: Progressing   Problem: Safety: Goal: Ability to remain free from injury will improve Outcome: Progressing   Problem: Skin Integrity: Goal: Risk for impaired skin integrity will decrease Outcome: Progressing   Problem: Education: Goal: Knowledge of disease or condition will improve Outcome: Progressing Goal: Knowledge of the prescribed therapeutic regimen will improve Outcome: Progressing   Problem: Activity: Goal: Risk for activity intolerance will decrease Outcome: Progressing   Problem: Cardiac: Goal: Will achieve and/or maintain hemodynamic stability Outcome: Progressing   Problem: Clinical  Measurements: Goal: Postoperative complications will be avoided or minimized Outcome: Progressing   Problem: Respiratory: Goal: Respiratory status will improve Outcome: Progressing   Problem: Pain Management: Goal: Pain level will decrease Outcome: Progressing   Problem: Skin Integrity: Goal: Wound healing without signs and symptoms infection will improve Outcome: Progressing

## 2020-07-18 NOTE — Discharge Instructions (Signed)
[facility] office [phone]   Robot-Assisted Thoracic Surgery, Care After The following information offers guidance on how to care for yourself after your procedure. Your health care provider may also give you more specific instructions. If you have problems or questions, contact your health care provider. What can I expect after the procedure? After the procedure, it is common to have:  Some pain and aches in the area of your surgical incisions.  Pain when breathing in (inhaling) and coughing.  Tiredness (fatigue).  Trouble sleeping.  Constipation. Follow these instructions at home: Medicines  Take over-the-counter and prescription medicines only as told by your health care provider.  If you were prescribed an antibiotic medicine, take it as told by your health care provider. Do not stop taking the antibiotic even if you start to feel better.  Talk with your health care provider about safe and effective ways to manage pain after your procedure. Pain management should fit your specific health needs.  Take pain medicine before pain becomes severe. Relieving and controlling your pain will make breathing easier for you.  Ask your health care provider if the medicine prescribed to you requires you to avoid driving or using machinery. Eating and drinking Follow instructions from your health care provider about eating or drinking restrictions. These will vary depending on what procedure you had. Your health care provider may recommend:  A liquid diet or soft diet for the first few days.  Meals that are smaller and more frequent.  A diet of fruits, vegetables, whole grains, and low-fat proteins.  Limiting foods that are high in fat and processed sugar, including fried or sweet foods. Incision care  Follow instructions from your health care provider about how to take care of your incisions. Make sure you: ? Wash your hands with soap and water for at least 20 seconds before and after  you change your bandage (dressing). If soap and water are not available, use hand sanitizer. ? Change your dressing as told by your health care provider. ? Leave stitches (sutures), skin glue, or adhesive strips in place. These skin closures may need to stay in place for 2 weeks or longer. If adhesive strip edges start to loosen and curl up, you may trim the loose edges. Do not remove adhesive strips completely unless your health care provider tells you to do that.  Check your incision area every day for signs of infection. Check for: ? Redness, swelling, or more pain. ? Fluid or blood. ? Warmth. ? Pus or a bad smell. Activity  Return to your normal activities as told by your health care provider. Ask your health care provider what activities are safe for you.  Ask your health care provider when it is safe for you to drive.  Do not lift anything that is heavier than 10 lb (4.5 kg), or the limit that you are told, until your health care provider says that it is safe.  Rest as told by your health care provider.  Avoid sitting for a long time without moving. Get up to take short walks every 1-2 hours. This is important to improve blood flow and breathing. Ask for help if you feel weak or unsteady.  Do exercises as told by your health care provider. Pneumonia prevention  Do deep breathing exercises and cough regularly as directed. This helps clear mucus and opens your lungs. Doing this helps prevent lung infection (pneumonia).  If you were given an incentive spirometer, use it as told. An incentive spirometer is  a tool that measures how well you are filling your lungs with each breath.  Coughing may hurt less if you try to support your chest. This is called splinting. Try one of these when you cough: ? Hold a pillow against your chest. ? Place the palms of both hands on top of your incision area.  Do not use any products that contain nicotine or tobacco. These products include cigarettes,  chewing tobacco, and vaping devices, such as e-cigarettes. If you need help quitting, ask your health care provider.  Avoid secondhand smoke.   General instructions  If you have a drainage tube: ? Follow instructions from your health care provider about how to take care of it. ? Do not travel by airplane after your tube is removed until your health care provider tells you it is safe.  You may need to take these actions to prevent or treat constipation: ? Drink enough fluid to keep your urine pale yellow. ? Take over-the-counter or prescription medicines. ? Eat foods that are high in fiber, such as beans, whole grains, and fresh fruits and vegetables. ? Limit foods that are high in fat and processed sugars, such as fried or sweet foods.  Keep all follow-up visits. This is important. Contact a health care provider if:  You have redness, swelling, or more pain around an incision.  You have fluid or blood coming from an incision.  An incision feels warm to the touch.  You have pus or a bad smell coming from an incision.  You have a fever.  You cannot eat or drink without vomiting.  Your pain medicine is not controlling your pain. Get help right away if:  You have chest pain.  Your heart is beating quickly.  You have trouble breathing.  You have trouble speaking.  You are confused.  You feel weak or dizzy, or you faint. These symptoms may represent a serious problem that is an emergency. Do not wait to see if the symptoms will go away. Get medical help right away. Call your local emergency services (911 in the [place] Do not drive yourself to the hospital. Summary  Talk with your health care provider about safe and effective ways to manage pain after your procedure. Pain management should fit your specific health needs.  Return to your normal activities as told by your health care provider. Ask your health care provider what activities are safe for you.  Do deep breathing  exercises and cough regularly as directed. This helps to clear mucus and prevent pneumonia. If it hurts to cough, ease pain by holding a pillow against your chest or by placing the palms of both hands over your incisions. This information is not intended to replace advice given to you by your health care provider. Make sure you discuss any questions you have with your health care provider. Document Revised: 01/25/2020 Document Reviewed: 01/25/2020 Elsevier Patient Education  2021 [employer].

## 2020-07-18 NOTE — Discharge Summary (Addendum)
Physician Discharge Summary  Patient ID: [name] MRN: [id number] DOB/AGE: [birth year] 71 y.o.  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 Osteopenia 03/24/2020  . Genital herpes simplex 03/24/2020  . Chronic fatigue syndrome 03/24/2020  . Bipolar I disorder ([place]) 03/24/2020  . Wears glasses   . Urgency of urination   . Tuberculosis   . Status post chemotherapy   . S/P dilatation of esophageal stricture   . Renal calculus, bilateral   . Positive reaction to tuberculin skin test   . Pneumonia   . Mild persistent asthma   . IDA (iron deficiency anemia)   . Hx of transfusion of packed red blood cells   . History of syncope   . History of positive PPD   . History of kidney stones   . History of DVT (deep vein thrombosis)   . History of concussion   . GERD (gastroesophageal reflux disease)   . GAD (generalized anxiety disorder)   . CKD (chronic kidney disease), stage III ([place])   . Chronically dry eyes   . Chronic cystitis   . Chronic bronchitis ([place])   . Arthritis   . 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 Pelvic mass in female 09/20/2019  . Ureteral stone with hydronephrosis 09/04/2019  . Edema 08/27/2019  . Bladder pain 08/2019  . Dysuria 05/17/2019  . Suspected 2019 novel coronavirus infection 04/25/2019  . Urinary urgency 03/06/2019  . Diarrhea 03/06/2019  . 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 Dyspnea on exertion 08/25/2017  . Chest pain in adult 07/13/2017  . HTN (hypertension) 05/31/2017  . Anemia 05/31/2017  . Leg pain, anterior, left 12/15/2016  . Paresthesia 12/15/2016  . Syncope 10/13/2016  . Migraine headache 10/13/2016  . Asthma 10/13/2016  . Anxiety 10/13/2016  . Hyperlipidemia 10/13/2016  . UTI (urinary tract infection) 10/13/2016  . Hypothyroidism 10/13/2016   . Laryngopharyngeal reflux (LPR) 06/15/2016  . Acute upper respiratory infection 05/05/2016  . Degenerative disc disease, cervical 11/29/2015  . IBS (irritable bowel syndrome) 11/24/2015  . Facet arthritis of cervical region 10/16/2015  . Chronic urinary tract infection 07/22/2015  . Malaise and fatigue 04/16/2015  . Severe episode of recurrent major depressive disorder, without psychotic features ([place]) 12/18/2014  . Post concussion syndrome 10/28/2014  . 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 Depression 10/25/2014  . Major depressive disorder, recurrent, severe without psychotic features ([place])   . 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 Concussion w/o coma 09/18/2014  . Irritable bowel syndrome with diarrhea 09/12/2014  . 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[place]) 04/10/2014  . Cold intolerance 03/26/2014  . Cough variant asthma  vs UACS/ irritable larynx  03/26/2014  . Abdominal pain 03/26/2014  . Positive skin test 03/26/2014  . Adjustment disorder with anxiety 02/07/2014  . Cervical pain 12/05/2013  . Fibromyalgia 12/05/2013  . Cephalalgia 12/05/2013  . Adult hypothyroidism 12/05/2013  . Adaptive colitis 12/05/2013  . Menopause 12/05/2013  . Mild cognitive disorder 12/05/2013  . Feeling bilious 12/05/2013  . Leiomyosarcoma ([place]) 09/10/2013  . Lung mass 09/10/2013  . Lung nodule, solitary 09/10/2013  . Abnormal chest x-ray   . Unspecified hypothyroidism 04/25/2013  . Vitamin D deficiency 04/25/2013  . 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 Female genuine stress incontinence 04/12/2012  . Healthcare maintenance 10/30/2010  . MUSCLE  PAIN 07/03/2008  . BIPLR I MOST RECENT EPIS DPRS SEV NO PSYCHOT [facility] 02/14/2008  . DEEP PHLEBOTHROMBOSIS PP UNSPEC AS EPIS CARE 02/14/2008  . Bipolar affective disorder, current episode depressed ([place]) 02/14/2008  . Deep phlebothrombosis, postpartum 02/14/2008  . HYPERLIPIDEMIA 03/04/2007  .  ALLERGIC RHINITIS 03/04/2007  . GERD 03/04/2007  . IRRITABLE BOWEL SYNDROME, HX OF 03/04/2007   HPI: At time of surgical evaluation.  Dr. Redmond Baseman for family's   [name] is a 70 year old non-smoker with a history of leiomyosarcoma of the pelvis with recurrence, asthma, hyperlipidemia, reflux, irritable bowel syndrome, hypothyroidism, anxiety, depression, bipolar, chronic UTIs, multiple concussions, fibromyalgia, vitamin D deficiency, syncope, migraines, and degenerative disc disease.   She has been followed with CTs and PET CTs for years now.  She had a known calcified granuloma in the right upper lobe.  On PET CT in May there was a new soft tissue component associated with that calcified granuloma.  A recent PET/CT showed an increase in size and metabolic activity in the nodule.   She was seen  in December.  She initially was adamant about having the nodule resected.  We had planned to do a navigational bronchoscopy to mark the nodule followed by a robotic wedge resection and possible lobectomy depending on intraoperative frozen section.   She then decided she did not want surgery and only wanted a navigational bronchoscopy, even understanding that there was a good chance we would not get a definitive diagnosis.  I did that procedure on 06/16/2020.  It was uncomplicated.  Needle aspirations and brushings were nondiagnostic.  She was then rescheduled for further procedure a endobronchial navigation for tumor marking and then robotic right VATS for wedge resection and possible lobectomy.  She was admitted this hospitalization for the procedure.  Hospital course: Patient was admitted on 07/17/2020 and taken the operating room And underwent robotic right thoracoscopy with right upper lobe wedge resection.  Additionally lymph node dissection and intercostal nerve block.  Frozen section revealed spindle cell tumor and therefore it was not felt she would require full lobectomy.  She tolerated the  procedure well and was taken to the postanesthesia care unit in stable condition.  She did receive a unit of blood.   Postoperative hospital course:  The patient has done very well.  She has maintained stable vital signs with some early hypertension that is improved.  Oxygen has been weaned and she maintains good saturations on room air.  On postop day 1 chest tube had minimal drainage and was removed.  Chest x-ray revealed no evidence of pneumothorax or significant effusion/infiltrate/atelectasis.  She does have a slightly elevated chronic renal insufficiency which is stable.  Her expected acute blood loss anemia is stable.  Her respiratory status remained stable with adequate O2 sats on room air. She was able to ambulate without assistance.   Discharged Condition: stable  Consults: None  Significant Diagnostic Studies: routine post op labs and CXR's  Treatments: surgery:   PROCEDURE:  Procedure(s): XI ROBOTIC ASSISTED RIGHTTHORASCOPY-WEDGE RESECTION (Right) VIDEO BRONCHOSCOPY WITH ENDOBRONCHIAL NAVIGATION FOR TUMOR MARKING (N/A) INTERCOSTAL NERVE BLOCK RIGHT (Right) LYMPH NODE DISSECTION RIGHT (Right)  SURGEON:  Surgeon(s) and Role:    Melrose Nakayama, MD - Primary  PHYSICIAN ASSISTANT: WAYNE GOLD PA-C Discharge Exam: Blood pressure 130/77, pulse 75, temperature 98.4 F (36.9 C), temperature source Oral, resp. rate 17, SpO2 96 %.  General appearance: alert, cooperative and no distress Heart: regular rate and rhythm Lungs: clear to auscultation bilaterally Abdomen:  benign Extremities: no edema Wound: incis ok  PATHOLOGY: pending at the time of discharge  Disposition:    Allergies as of 07/19/2020      Reactions   Buprenorphine Hcl Other (See Comments)   Severe headache, confusion   Morphine And Related Other (See Comments)   Severe headache   Buprenorphine Other (See Comments)   Cephalexin Nausea And Vomiting, Nausea Only   Other reaction(s): Vomiting    Other Other (See Comments)   Trees & Grass = allergic symptoms   Codeine Other (See Comments)   Disorientation   Dust Mite Extract Cough   Molds & Smuts Cough      Medication List    TAKE these medications   acetaminophen 325 MG tablet Commonly known as: TYLENOL Take 650 mg by mouth every 6 (six) hours as needed for moderate pain.   albuterol 108 (90 Base) MCG/ACT inhaler Commonly known as: VENTOLIN HFA Inhale 1-2 puffs into the lungs every 6 (six) hours as needed for wheezing or shortness of breath.   albuterol (2.5 MG/3ML) 0.083% nebulizer solution Commonly known as: PROVENTIL Take 3 mLs (2.5 mg total) by nebulization every 6 (six) hours as needed for wheezing or shortness of breath.   ALPRAZolam 0.5 MG tablet Commonly known as: XANAX Take 1 tablet (0.5 mg total) by mouth 2 (two) times daily as needed for anxiety.   aspirin EC 81 MG tablet Take 81 mg by mouth daily.   busPIRone 30 MG tablet Commonly known as: BUSPAR Take 30 mg by mouth 2 (two) times daily.   ciprofloxacin 250 MG tablet Commonly known as: CIPRO Take 1 tablet (250 mg total) by mouth 2 (two) times daily.   colesevelam 625 MG tablet Commonly known as: Welchol Take 2 tablets (1,250 mg total) by mouth 2 (two) times daily with a meal.   D-Mannose 500 MG Caps Take 500 mg by mouth 2 (two) times daily. WITH CRANBERRY & DANDELION EXTRACT   esomeprazole 40 MG capsule Commonly known as: NEXIUM TAKE 1 CAPSULE BY MOUTH EVERY MORNING What changed:   how much to take  how to take this  when to take this  additional instructions   estradiol 1 MG tablet Commonly known as: ESTRACE Take 1 tablet (1 mg total) by mouth daily.   Fish Oil 1200 MG Caps Take 1,200 mg by mouth daily.   FLUoxetine 40 MG capsule Commonly known as: PROZAC Take 1 capsule (40 mg total) by mouth every morning.   furosemide 20 MG tablet Commonly known as: LASIX TAKE 1-2 TABLETS (20-40 MG TOTAL) BY MOUTH DAILY. What changed: how  much to take   ipratropium-albuterol 0.5-2.5 (3) MG/3ML Soln Commonly known as: DUONEB Take 3 mLs by nebulization every 6 (six) hours as needed (Asthma).   iron polysaccharides 150 MG capsule Commonly known as: NIFEREX TAKE 1 CAPSULE BY MOUTH TWICE A DAY   levothyroxine 100 MCG tablet Commonly known as: SYNTHROID TAKE 1 TABLET BY MOUTH EVERY DAY BEFORE BREAKFAST What changed: See the new instructions.   lisinopril 20 MG tablet Commonly known as: ZESTRIL Take 0.5 tablets (10 mg total) by mouth daily.   mirtazapine 30 MG tablet Commonly known as: REMERON Take 1 tablet (30 mg total) by mouth at bedtime.   multivitamin with minerals tablet Take 1 tablet by mouth daily.   ondansetron 4 MG tablet Commonly known as: Zofran Take 1 tablet (4 mg total) by mouth every 8 (eight) hours as needed for nausea or vomiting.   prochlorperazine 10  MG tablet Commonly known as: COMPAZINE Take 1 tablet (10 mg total) by mouth every 6 (six) hours as needed for nausea or vomiting.   progesterone 100 MG capsule Commonly known as: PROMETRIUM Take 100 mg by mouth at bedtime.   SUMAtriptan 100 MG tablet Commonly known as: IMITREX Take 0.5 tablets (50 mg total) by mouth every 2 (two) hours as needed for migraine.   SYSTANE OP Place 1 drop into both eyes 2 (two) times daily as needed (dry eyes).   traMADol 50 MG tablet Commonly known as: ULTRAM Take 1 tablet (50 mg total) by mouth every 6 (six) hours as needed for up to 7 days (mild pain).   triamterene-hydrochlorothiazide 37.5-25 MG tablet Commonly known as: MAXZIDE-25 Take 1 tablet by mouth daily.       Follow-up Information    Melrose Nakayama, MD. Go on 08/05/2020.   Specialty: Cardiothoracic Surgery Why: Your follow-up appointment with Dr. Roxan Hockey is at 4:40pm.  Please obtain a chest x-ray 1/2-hour prior to appointment from [facility] which is located in the same office complex on the first floor. Contact  information: [address] [place]  [address] [phone]               Signed: Antony Odea, PA-C 07/19/2020, 11:20 AM

## 2020-07-19 ENCOUNTER — Inpatient Hospital Stay (HOSPITAL_COMMUNITY): Payer: Medicare Other

## 2020-07-19 LAB — COMPREHENSIVE METABOLIC PANEL
ALT: 56 U/L — ABNORMAL HIGH (ref 0–44)
AST: 48 U/L — ABNORMAL HIGH (ref 15–41)
Albumin: 3 g/dL — ABNORMAL LOW (ref 3.5–5.0)
Alkaline Phosphatase: 68 U/L (ref 38–126)
Anion gap: 8 (ref 5–15)
BUN: 23 mg/dL (ref 8–23)
CO2: 21 mmol/L — ABNORMAL LOW (ref 22–32)
Calcium: 9.6 mg/dL (ref 8.9–10.3)
Chloride: 108 mmol/L (ref 98–111)
Creatinine, Ser: 1.34 mg/dL — ABNORMAL HIGH (ref 0.44–1.00)
GFR, Estimated: 43 mL/min — ABNORMAL LOW (ref >60)
Glucose, Bld: 90 mg/dL (ref 70–99)
Potassium: 4.1 mmol/L (ref 3.5–5.1)
Sodium: 137 mmol/L (ref 135–145)
Total Bilirubin: 0.6 mg/dL (ref 0.3–1.2)
Total Protein: 5.4 g/dL — ABNORMAL LOW (ref 6.5–8.1)

## 2020-07-19 LAB — CBC
HCT: 26.6 % — ABNORMAL LOW (ref 36.0–46.0)
Hemoglobin: 8.9 g/dL — ABNORMAL LOW (ref 12.0–15.0)
MCH: 31.7 pg (ref 26.0–34.0)
MCHC: 33.5 g/dL (ref 30.0–36.0)
MCV: 94.7 fL (ref 80.0–100.0)
Platelets: 226 10*3/uL (ref 150–400)
RBC: 2.81 MIL/uL — ABNORMAL LOW (ref 3.87–5.11)
RDW: 14.1 % (ref 11.5–15.5)
WBC: 8.9 10*3/uL (ref 4.0–10.5)
nRBC: 0 % (ref 0.0–0.2)

## 2020-07-19 MED ORDER — TRAMADOL HCL 50 MG PO TABS: 50.0000 mg | ORAL_TABLET | Freq: Four times a day (QID) | ORAL | 0 refills | Status: AC | PRN

## 2020-07-19 MED ORDER — SUMATRIPTAN SUCCINATE 100 MG PO TABS: 50.0000 mg | ORAL_TABLET | ORAL | Status: DC | PRN

## 2020-07-19 MED ORDER — ALPRAZOLAM 0.5 MG PO TABS: 0.5000 mg | ORAL_TABLET | Freq: Two times a day (BID) | ORAL | Status: DC | PRN

## 2020-07-19 NOTE — Progress Notes (Signed)
Pt got discharged to home, discharge instructions provided and patient showed understanding to it, IV taken out,Telemonitor DC,pt left unit in wheelchair with all of the belongings accompanied with a family member (Sister)  Palma Holter, Therapist, sports

## 2020-07-19 NOTE — Plan of Care (Signed)
  Problem: Education: Goal: Knowledge of General Education information will improve Description: Including pain rating scale, medication(s)/side effects and non-pharmacologic comfort measures Outcome: Completed/Met   Problem: Health Behavior/Discharge Planning: Goal: Ability to manage health-related needs will improve Outcome: Completed/Met   Problem: Clinical Measurements: Goal: Ability to maintain clinical measurements within normal limits will improve Outcome: Completed/Met Goal: Will remain free from infection Outcome: Completed/Met Goal: Diagnostic test results will improve Outcome: Completed/Met Goal: Respiratory complications will improve Outcome: Completed/Met Goal: Cardiovascular complication will be avoided Outcome: Completed/Met   Problem: Nutrition: Goal: Adequate nutrition will be maintained Outcome: Completed/Met   Problem: Coping: Goal: Level of anxiety will decrease Outcome: Completed/Met   Problem: Elimination: Goal: Will not experience complications related to bowel motility Outcome: Completed/Met Goal: Will not experience complications related to urinary retention Outcome: Completed/Met   Problem: Pain Managment: Goal: General experience of comfort will improve Outcome: Completed/Met   Problem: Safety: Goal: Ability to remain free from injury will improve Outcome: Completed/Met   Problem: Skin Integrity: Goal: Risk for impaired skin integrity will decrease Outcome: Completed/Met   Problem: Activity: Goal: Risk for activity intolerance will decrease Outcome: Completed/Met   Problem: Cardiac: Goal: Will achieve and/or maintain hemodynamic stability Outcome: Completed/Met   Problem: Respiratory: Goal: Respiratory status will improve Outcome: Completed/Met   Problem: Pain Management: Goal: Pain level will decrease Outcome: Completed/Met   Problem: Skin Integrity: Goal: Wound healing without signs and symptoms infection will  improve Outcome: Completed/Met

## 2020-07-19 NOTE — Plan of Care (Signed)
  Problem: Education: Goal: Knowledge of General Education information will improve Description: Including pain rating scale, medication(s)/side effects and non-pharmacologic comfort measures Outcome: Progressing   Problem: Health Behavior/Discharge Planning: Goal: Ability to manage health-related needs will improve Outcome: Progressing   Problem: Clinical Measurements: Goal: Ability to maintain clinical measurements within normal limits will improve Outcome: Progressing Goal: Will remain free from infection Outcome: Progressing Goal: Diagnostic test results will improve Outcome: Progressing Goal: Respiratory complications will improve Outcome: Progressing Goal: Cardiovascular complication will be avoided Outcome: Progressing   Problem: Activity: Goal: Risk for activity intolerance will decrease Outcome: Progressing   Problem: Nutrition: Goal: Adequate nutrition will be maintained Outcome: Progressing   Problem: Coping: Goal: Level of anxiety will decrease Outcome: Progressing   Problem: Elimination: Goal: Will not experience complications related to urinary retention Outcome: Progressing   Problem: Pain Managment: Goal: General experience of comfort will improve Outcome: Progressing   Problem: Skin Integrity: Goal: Risk for impaired skin integrity will decrease Outcome: Progressing

## 2020-07-19 NOTE — Progress Notes (Addendum)
2 Days Post-Op Procedure(s) (LRB): XI ROBOTIC ASSISTED RIGHTTHORASCOPY-WEDGE RESECTION (Right) VIDEO BRONCHOSCOPY WITH ENDOBRONCHIAL NAVIGATION FOR TUMOR MARKING (N/A) INTERCOSTAL NERVE BLOCK RIGHT (Right) LYMPH NODE DISSECTION RIGHT (Right) Subjective: Feels well. Less soreness.  Walked in [employer], Sats down briefly but recovered quickly when she returned to bed. She want so go home.   Objective: Vital signs in last 24 hours: Temp:  [97.8 F (36.6 C)-98.4 F (36.9 C)] 98.4 F (36.9 C) (03/05 0712) Pulse Rate:  [74-80] 74 (03/05 0712) Cardiac Rhythm: Normal sinus rhythm (03/05 0719) Resp:  [13-18] 13 (03/05 0712) BP: (107-132)/(51-80) 108/51 (03/05 0712) SpO2:  [91 %-95 %] 94 % (03/05 0712)     Intake/Output from previous day: No intake/output data recorded. Intake/Output this shift: No intake/output data recorded.  General appearance: alert, cooperative and no distress Heart: regular rate and rhythm Lungs: clear to auscultation bilaterally Abdomen: benign Extremities: no edema Wound: incis ok  Lab Results: Recent Labs    07/18/20 0125 07/19/20 0112  WBC 9.6 8.9  HGB 9.0* 8.9*  HCT 27.5* 26.6*  PLT 227 226   BMET:  Recent Labs    07/18/20 0125 07/19/20 0112  NA 134* 137  K 4.7 4.1  CL 106 108  CO2 18* 21*  GLUCOSE 124* 90  BUN 25* 23  CREATININE 1.50* 1.34*  CALCIUM 8.7* 9.6    PT/INR:  No results for input(s): LABPROT, INR in the last 72 hours. ABG    Component Value Date/Time   PHART 7.258 (L) 07/17/2020 1300   HCO3 16.1 (L) 07/17/2020 1300   TCO2 20 (L) 07/17/2020 1110   ACIDBASEDEF 9.8 (H) 07/17/2020 1300   O2SAT 92.2 07/17/2020 1300   CBG (last 3)  No results for input(s): GLUCAP in the last 72 hours.  Meds Scheduled Meds: . acetaminophen  1,000 mg Oral Q6H   Or  . acetaminophen (TYLENOL) oral liquid 160 mg/5 mL  1,000 mg Oral Q6H  . bisacodyl  10 mg Oral Daily  . busPIRone  30 mg Oral BID  . ciprofloxacin  250 mg Oral BID  .  colesevelam  1,250 mg Oral BID WC  . enoxaparin (LOVENOX) injection  40 mg Subcutaneous Daily  . estradiol  1 mg Oral Daily  . FLUoxetine  40 mg Oral q morning  . iron polysaccharides  150 mg Oral BID  . levothyroxine  100 mcg Oral QAC breakfast  . lisinopril  10 mg Oral Daily  . mirtazapine  30 mg Oral QHS  . multivitamin with minerals  1 tablet Oral Daily  . pantoprazole  40 mg Oral Daily  . progesterone  100 mg Oral QHS  . senna-docusate  1 tablet Oral QHS  . triamterene-hydrochlorothiazide  1 tablet Oral Daily   Continuous Infusions: . dextrose 5 % and 0.9% NaCl 10 mL/hr at 07/18/20 0746   PRN Meds:.acetaminophen, albuterol, albuterol, ALPRAZolam, fentaNYL (SUBLIMAZE) injection, hydroxypropyl methylcellulose / hypromellose, ipratropium-albuterol, ondansetron (ZOFRAN) IV, ondansetron, prochlorperazine, SUMAtriptan, traMADol  Xrays DG CHEST PORT 1 VIEW  Result Date: 07/19/2020 CLINICAL DATA:  RIGHT thoracoscopy and wedge resection. EXAM: PORTABLE CHEST 1 VIEW COMPARISON:  07/18/2020 FINDINGS: The RIGHT thoracostomy tube has been removed. A tiny RIGHT apical pneumothorax is unchanged and better identified. Scattered RIGHT lung opacities/atelectasis are again noted. The LEFT lung is clear. No other significant changes are present. IMPRESSION: Removal of RIGHT thoracostomy tube with tiny RIGHT apical pneumothorax. No other significant change. Electronically Signed   By: Margarette Canada M.D.   On: 07/19/2020 08:55  DG CHEST PORT 1 VIEW  Result Date: 07/18/2020 CLINICAL DATA:  Chest tube, pulmonary mass EXAM: PORTABLE CHEST 1 VIEW COMPARISON:  07/17/2020 FINDINGS: Right large bore chest tube is unchanged. Surgical staple line and focal opacity noted at the right apex, stable. The lungs are otherwise clear. No pneumothorax or pleural effusion. Cardiac size within normal limits. Pulmonary vascularity is normal. IMPRESSION: Stable examination.  Right chest tube in place.  No pneumothorax.  Electronically Signed   By: Fidela Salisbury MD   On: 07/18/2020 06:57   DG CHEST PORT 1 VIEW  Result Date: 07/17/2020 CLINICAL DATA:  Right lung mass postop pain EXAM: PORTABLE CHEST 1 VIEW COMPARISON:  07/15/2020 FINDINGS: Right chest tube in good position. Apparent right upper lobectomy. Right upper lobe staples and mild airspace disease. No pneumothorax. Mild gas in the soft tissues above the right lung apex. No pleural effusion. Mild bibasilar atelectasis. IMPRESSION: Right chest tube in good position. No pneumothorax. Postop right upper lobe resection. Mild bibasilar atelectasis. Electronically Signed   By: Franchot Gallo M.D.   On: 07/17/2020 13:36    Assessment/Plan: S/P Procedure(s) (LRB): XI ROBOTIC ASSISTED RIGHTTHORASCOPY-WEDGE RESECTION (Right) VIDEO BRONCHOSCOPY WITH ENDOBRONCHIAL NAVIGATION FOR TUMOR MARKING (N/A) INTERCOSTAL NERVE BLOCK RIGHT (Right) LYMPH NODE DISSECTION RIGHT (Right)   1 afeb, VSS  2 Independent with mobility, O2 sats acceptable on RA 3. CXR - stable, small stable right apical space. 4 Expected ABLA- stable and improved appropriately after transfusion 5. Plan for discharge today.     LOS: 2 days    Antony Odea PA-C [phone]  Patient seen and examined, agree with above She looks fine for DC today Path still pending  [name] C. Roxan Hockey, MD [facility] [phone]

## 2020-07-19 NOTE — Op Note (Signed)
NAME: [name], [name] MEDICAL RECORD NO: [id number] ACCOUNT NO: [id number] DATE OF BIRTH: [birth year] FACILITY: [facility] LOCATION: [facility] PHYSICIAN: Revonda Standard. Roxan Hockey, MD  Operative Report   DATE OF PROCEDURE: 07/17/2020   PREOPERATIVE DIAGNOSIS:  Right upper lobe nodule.  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  PROCEDURE PERFORMED:   1. Electromagnetic navigational bronchoscopy for tumor marking,  2. Xi robotic-assisted right upper lobe wedge resection, 3. Lymph node sampling 4. Intercostal nerve blocks.  SURGEON:  Modesto Charon, MD.  ASSISTANT:  Jadene Pierini, PA  ANESTHESIA:  General.  FINDINGS: Tear of diaphragm with port placement, repaired.  Frozen section revealed a spindle cell tumor, probably metastatic sarcoma, margin was clear.  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  On followup scan, she has developed a new soft tissue component in the vicinity of  the calcified granuloma that has increased in size and metabolic activity.  She was referred for consideration for resection.  Originally, the plan was to do navigational bronchoscopy to mark the nodule followed by a wedge resection and possible  lobectomy.  She then decided that she only wanted to have bronchoscopy.  That was done, but unfortunately was nondiagnostic.  She now agrees to a tumor marking followed by a wedge resection and possible lobectomy.  The indications, risks, benefits, and  alternatives were discussed in detail with the patient.  She understood and accepted the risks and agreed to proceed.   OPERATIVE NOTE:  [name] was brought to the preoperative holding area on 07/18/2020.  Anesthesia placed an arterial blood pressure monitoring line and established intravenous access.  She was taken to the  operating room, anesthetized, and intubated with  a single lumen endotracheal tube.  Foley catheter was placed.  Sequential compression devices were placed on the calves for DVT prophylaxis.  Intravenous antibiotics were administered.  A timeout was performed.  Flexible fiberoptic bronchoscopy was performed via the endotracheal tube, it revealed no endobronchial lesions to the level of the subsegmental bronchi.  There was normal endobronchial anatomy. The locatable guide for navigation  was placed.  Planning had been done prior to her previous procedure.  The bronchoscope was advanced to the right upper lobe and the apical segmental bronchus was cannulated.  The catheter was advanced to within 1.5 cm of the nodule with good alignment.   Using fluoroscopy an aspirating needle was placed into the vicinity of the nodule and 0.5 mL of a solution containing 50% methylene blue and 50% ICG was injected.  The catheter and bronchoscope were withdrawn.  The fluoroscopy time was less than 30 seconds, the  dose was 3 milligray.  She was reintubated with a double lumen endotracheal tube.  She was placed in a left lateral decubitus position.  A Bair Hugger was placed for active warming.  The right chest was prepped and draped in the usual sterile fashion.  Single lung ventilation of the left lung was initiated and was tolerated well throughout the procedure.  An additional timeout was performed.  A solution containing 20 mL of liposomal  bupivacaine, 30 mL of 0.5% bupivacaine and 50 mL of saline was prepared.  This was used for local anesthesia at the incision sites as well as for the intercostal nerve blocks.  An incision was made in the eighth interspace in the midaxillary line and  an 8 mm  port was inserted.  The thoracoscope was advanced and it appeared that this might be an intra-abdominal placement.  A 12 mm port was placed in the eighth interspace, 5 cm posterior to that and was intrapleural.  Carbon dioxide  was insufflated per protocol.  The 8 mm camera port  was replaced, a 12 mm port was placed in the eighth interspace anterior to the camera port.  Intercostal nerve blocks then were performed from the 3rd to the 10th interspace.  The needle was advanced from a posterior approach and 10 mL of the  bupivacaine solution was injected into a subpleural plane at each level.  An additional 8 mm port was placed in the eighth interspace posteriorly for the retraction arm.  There was some bleeding from this site.  A 12 mm AirSeal port was placed in the  tenth interspace posterolaterally for the assistant.  It was apparent there was some bleeding coming from around the diaphragm.  This was difficult to visualize due to the angle, but the tear could be seen.  Pressure was held in this area and suctioning  was performed and ultimately the bleeding appeared to stop. The dissection was, however, difficult due to air having gotten under the diaphragm impairing the view of the inferior ligament.  Decision was made to go ahead and do the wedge resection.  The  upper lobe was inspected.  The methylene blue was apparent on the pleural surface, using the Firefly setting on the console, the ICG was clearly visible in the apical portion of the right upper lobe. A wedge resection then was performed using the  robotic stapler.  A combination of blue and green cartridges were used.  Greater than 1 cm gross margin was maintained on the visible ICG.  The specimen was placed into an endoscopic retrieval bag and removed.  The calcified granuloma adjacent to the nodule was easily palpable.  The specimen was sent for frozen section.  While awaiting the results of the frozen section, the tear in the diaphragm was inspected.  This was repaired using  a 2-0 V-Loc suture in a running fashion.  Several lymph nodes were sampled while awaiting the results of the frozen section. Frozen section returned showing this was a spindle cell tumor, not a  primary lung cancer, so no additional resection was necessary.  Final inspection was made for  hemostasis and there was no evidence of any ongoing bleeding.  A 28-French Blake drain was placed through the anterior port incision and secured with a #1 silk suture.  Dual lung ventilation was resumed.  The remaining incisions were closed in standard  fashion.  Dermabond was applied.  The chest tube was placed to a Pleur-Evac on waterseal.  She was extubated in the operating room and taken to the postanesthetic care unit in good condition.  All sponge, needle and instrument counts were correct at the end of the procedure.  SUJ D: 07/18/2020 3:38:39 pm T: 07/19/2020 5:18:00 am  JOB: [id number]/ [id number]

## 2020-07-21 ENCOUNTER — Telehealth: Payer: Self-pay | Admitting: Internal Medicine

## 2020-07-21 ENCOUNTER — Encounter: Payer: Self-pay | Admitting: Thoracic Surgery (Cardiothoracic Vascular Surgery)

## 2020-07-21 LAB — SURGICAL PATHOLOGY

## 2020-07-21 MED ORDER — LISINOPRIL 20 MG PO TABS: 10.0000 mg | ORAL_TABLET | Freq: Every day | ORAL | 3 refills | Status: DC

## 2020-07-21 NOTE — Telephone Encounter (Signed)
Reviewed chart pt is up-to-date sent refills to [facility]Marland Kitchen/LMB

## 2020-07-21 NOTE — Anesthesia Postprocedure Evaluation (Signed)
Anesthesia Post Note  Patient: [name]  Procedure(s) Performed: XI ROBOTIC ASSISTED RIGHTTHORASCOPY-WEDGE RESECTION (Right Chest) VIDEO BRONCHOSCOPY WITH ENDOBRONCHIAL NAVIGATION FOR TUMOR MARKING (N/A Bronchus) INTERCOSTAL NERVE BLOCK RIGHT (Right Chest) LYMPH NODE DISSECTION RIGHT (Right Chest)     Patient location during evaluation: PACU Anesthesia Type: General Level of consciousness: awake and alert Pain management: pain level controlled Vital Signs Assessment: post-procedure vital signs reviewed and stable Respiratory status: spontaneous breathing, nonlabored ventilation, respiratory function stable and patient connected to nasal cannula oxygen Cardiovascular status: blood pressure returned to baseline and stable Postop Assessment: no apparent nausea or vomiting Anesthetic complications: no   No complications documented.  Last Vitals:  Vitals:   07/19/20 0712 07/19/20 1100  BP: (!) 108/51 130/77  Pulse: 74 75  Resp: 13 17  Temp: 36.9 C 36.9 C  SpO2: 94% 96%    Last Pain:  Vitals:   07/19/20 1100  TempSrc: Oral  PainSc: 0-No pain                 Jasim Harari

## 2020-07-21 NOTE — Telephone Encounter (Signed)
1.Medication Requested: lisinopril (ZESTRIL) 20 MG tablet    2. Pharmacy (Name, Street, [place]): [id number] - [place], [place]  3. On Med List: yes   4. Last Visit with PCP: 11.29.21  5. Next visit date with PCP: 3.30.22   Agent: Please be advised that RX refills may take up to 3 business days. We ask that you follow-up with your pharmacy.

## 2020-07-23 ENCOUNTER — Ambulatory Visit: Payer: Medicare Other | Admitting: Gynecologic Oncology

## 2020-07-23 ENCOUNTER — Ambulatory Visit: Payer: Medicare Other | Admitting: Psychology

## 2020-07-24 ENCOUNTER — Other Ambulatory Visit: Payer: Self-pay | Admitting: *Deleted

## 2020-07-24 ENCOUNTER — Telehealth: Payer: Self-pay

## 2020-07-24 ENCOUNTER — Telehealth: Payer: Self-pay | Admitting: *Deleted

## 2020-07-24 NOTE — Progress Notes (Signed)
The proposed treatment discussed in cancer conference is for discussion purpose only and is not a binding recommendation. The patient was not physically examined nor present for their treatment options. Therefore, final treatment plans cannot be decided.  ?

## 2020-07-24 NOTE — Telephone Encounter (Signed)
Called and left the patient a message to call the office back. Per Dr Berline Lopes, patient needs an appt within the next couple weeks with her to discuss the lung surgery and recommendations.

## 2020-07-24 NOTE — Telephone Encounter (Signed)
Patient contacted the office concerned about pain, swelling, and soreness on incision site and around incision.  She is s/p Right RATS/ wedge with Dr. Roxan Hockey 07/17/20.  She stated that Tramadol that she is taking for pain does help some.  She states that it does not look infected but hurts around incision.  She states that her breathing and cough is better than it has been.  Advised patient that due to the nature and location of the incision, nerve pain could be anticipated on and around the incision site.  Patient advised to continue to take pain medication as it has been one week since surgery.  Advised to call the office back if she has any worsening or new symptoms.  She acknowledged receipt.

## 2020-07-25 ENCOUNTER — Other Ambulatory Visit: Payer: Self-pay | Admitting: Internal Medicine

## 2020-07-25 NOTE — Telephone Encounter (Signed)
Attempted to reach the patient to schedule a follow up appt after her lung surgery. Left a message to call the office back.

## 2020-07-28 ENCOUNTER — Encounter (HOSPITAL_COMMUNITY): Payer: Medicare Other

## 2020-07-28 ENCOUNTER — Encounter: Payer: Self-pay | Admitting: Gynecologic Oncology

## 2020-07-28 ENCOUNTER — Other Ambulatory Visit (HOSPITAL_COMMUNITY): Payer: Medicare Other

## 2020-07-28 ENCOUNTER — Other Ambulatory Visit: Payer: Self-pay | Admitting: Oncology

## 2020-07-28 ENCOUNTER — Telehealth: Payer: Self-pay | Admitting: *Deleted

## 2020-07-28 NOTE — Telephone Encounter (Signed)
Called the patient and left a message to call the office back. Per Dr Berline Lopes patient needs a follow up appt

## 2020-07-28 NOTE — Progress Notes (Signed)
Gynecologic Oncology Multi-Disciplinary Disposition Conference Note  Date of the Conference: 07/28/2020  Patient Name: [name] [facility]  Primary GYN Oncologist: Dr. Berline Lopes  Stage/Disposition:  Recurrent leiomyosarcoma. Disposition is for observation with routine surveillance imaging.   This Multidisciplinary conference took place involving physicians from [place], [place], Radiation Oncology, Pathology, Radiology along with the Gynecologic Oncology Nurse Practitioner and RN.  Comprehensive assessment of the patient's malignancy, staging, need for surgery, chemotherapy, radiation therapy, and need for further testing were reviewed. Supportive measures, both inpatient and following discharge were also discussed. The recommended plan of care is documented. Greater than 35 minutes were spent correlating and coordinating this patient's care.

## 2020-07-29 NOTE — Telephone Encounter (Signed)
Returned patient's call and scheduled a follow up for 3/25 at 2:15 pm

## 2020-08-01 ENCOUNTER — Telehealth: Payer: Self-pay | Admitting: Internal Medicine

## 2020-08-01 NOTE — Telephone Encounter (Signed)
Patient requesting refill for ALPRAZolam Duanne Moron) 0.5 MG tablet  Pharmacy [id number] - [place], [place].

## 2020-08-03 MED ORDER — ALPRAZOLAM 0.5 MG PO TABS: 0.5000 mg | ORAL_TABLET | Freq: Two times a day (BID) | ORAL | 1 refills | Status: DC | PRN

## 2020-08-03 NOTE — Telephone Encounter (Signed)
Okay.  Done.  Thanks 

## 2020-08-04 ENCOUNTER — Other Ambulatory Visit: Payer: Self-pay | Admitting: Thoracic Surgery (Cardiothoracic Vascular Surgery)

## 2020-08-04 DIAGNOSIS — R911 Solitary pulmonary nodule: Secondary | ICD-10-CM

## 2020-08-05 ENCOUNTER — Encounter: Payer: Self-pay | Admitting: Thoracic Surgery (Cardiothoracic Vascular Surgery)

## 2020-08-05 ENCOUNTER — Other Ambulatory Visit: Payer: Self-pay

## 2020-08-05 ENCOUNTER — Ambulatory Visit (INDEPENDENT_AMBULATORY_CARE_PROVIDER_SITE_OTHER): Payer: Self-pay | Admitting: Thoracic Surgery (Cardiothoracic Vascular Surgery)

## 2020-08-05 ENCOUNTER — Ambulatory Visit
Admission: RE | Admit: 2020-08-05 | Discharge: 2020-08-05 | Disposition: A | Discharge status: Home / Self Care | Payer: Medicare Other | Source: Ambulatory Visit | Attending: Thoracic Surgery (Cardiothoracic Vascular Surgery) | Admitting: Thoracic Surgery (Cardiothoracic Vascular Surgery)

## 2020-08-05 VITALS — BP 103/62 | HR 92 | Resp 20 | Ht 62.0 in | Wt 158.0 lb

## 2020-08-05 DIAGNOSIS — J984 Other disorders of lung: Secondary | ICD-10-CM | POA: Diagnosis not present

## 2020-08-05 DIAGNOSIS — R911 Solitary pulmonary nodule: Secondary | ICD-10-CM | POA: Diagnosis not present

## 2020-08-05 DIAGNOSIS — Z9889 Other specified postprocedural states: Secondary | ICD-10-CM
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
## 2020-08-06 ENCOUNTER — Ambulatory Visit (INDEPENDENT_AMBULATORY_CARE_PROVIDER_SITE_OTHER): Payer: Medicare Other | Admitting: Psychology

## 2020-08-06 DIAGNOSIS — F4321 Adjustment disorder with depressed mood: Secondary | ICD-10-CM

## 2020-08-08 ENCOUNTER — Telehealth: Payer: Self-pay | Admitting: *Deleted

## 2020-08-08 ENCOUNTER — Inpatient Hospital Stay: Payer: Medicare Other | Admitting: Gynecologic Oncology

## 2020-08-08 NOTE — Telephone Encounter (Signed)
Patient called and rescheduled her appt from today to Monday

## 2020-08-11 ENCOUNTER — Inpatient Hospital Stay: Payer: Medicare Other | Admitting: Gynecologic Oncology

## 2020-08-12 ENCOUNTER — Encounter (HOSPITAL_COMMUNITY): Payer: Medicare Other

## 2020-08-12 ENCOUNTER — Encounter: Payer: Self-pay | Admitting: General Practice

## 2020-08-12 ENCOUNTER — Encounter (HOSPITAL_COMMUNITY): Payer: Self-pay

## 2020-08-12 NOTE — Progress Notes (Signed)
[facility] Spiritual Care Note  In my role as interim facilitator of GYN Cancer Support Group, reached out to [facility] by phone for pastoral check-in. She was very welcoming of call and notes that she has good support, uses her coping tools, and generally has a positive attitude, but is feeling depressed due to covid isolation, a year of significant health problems, and worries about treatment/prognosis. She plans to reach out to Dr Cheryln Manly for a counseling appointment sooner than what she already has scheduled and to explore [employer] for meaning-making, enjoyment, and social connection. We also plan to talk again by phone next week as an additional layer of emotional support.   [place], [place], [facility] Pager [phone] Voicemail [phone]

## 2020-08-13 ENCOUNTER — Ambulatory Visit: Payer: Medicare Other | Admitting: Internal Medicine

## 2020-08-14 ENCOUNTER — Encounter (HOSPITAL_COMMUNITY): Payer: Medicare Other

## 2020-08-14 ENCOUNTER — Encounter (HOSPITAL_COMMUNITY): Admission: RE | Admit: 2020-08-14 | Payer: Medicare Other | Source: Ambulatory Visit

## 2020-08-19 ENCOUNTER — Encounter: Payer: Self-pay | Admitting: General Practice

## 2020-08-19 NOTE — Progress Notes (Signed)
[facility] Spiritual Care Note  Followed up by phone as planned, leaving voicemail to encourage return call.   [place], [place], [facility] Pager [phone] Voicemail [phone]

## 2020-08-20 ENCOUNTER — Ambulatory Visit: Payer: Medicare Other | Admitting: Psychology

## 2020-08-20 ENCOUNTER — Ambulatory Visit: Payer: Medicare Other | Admitting: Internal Medicine

## 2020-08-21 ENCOUNTER — Telehealth: Payer: Self-pay | Admitting: Internal Medicine

## 2020-08-21 ENCOUNTER — Encounter: Payer: Self-pay | Admitting: Gynecologic Oncology

## 2020-08-21 DIAGNOSIS — N390 Urinary tract infection, site not specified: Secondary | ICD-10-CM

## 2020-08-21 DIAGNOSIS — R300 Dysuria: Secondary | ICD-10-CM

## 2020-08-21 NOTE — Telephone Encounter (Signed)
Notified pt order has been placed../lmb 

## 2020-08-21 NOTE — Addendum Note (Signed)
Addended by: Earnstine Regal on: 08/21/2020 02:32 PM   Modules accepted: Orders

## 2020-08-21 NOTE — Telephone Encounter (Signed)
Patient states she has been having painful urination and nausea so she is wondering if we can do a urinalysis

## 2020-08-21 NOTE — Telephone Encounter (Signed)
Ok [facility] Thx 

## 2020-08-22 ENCOUNTER — Inpatient Hospital Stay: Payer: Medicare Other | Attending: Gynecologic Oncology | Admitting: Gynecologic Oncology

## 2020-08-22 ENCOUNTER — Other Ambulatory Visit: Payer: Self-pay

## 2020-08-22 ENCOUNTER — Other Ambulatory Visit (INDEPENDENT_AMBULATORY_CARE_PROVIDER_SITE_OTHER): Payer: Medicare Other

## 2020-08-22 ENCOUNTER — Encounter: Payer: Self-pay | Admitting: Gynecologic Oncology

## 2020-08-22 VITALS — BP 121/82 | HR 103 | Temp 97.1°F | Resp 15 | Ht 62.0 in | Wt 163.8 lb

## 2020-08-22 DIAGNOSIS — R300 Dysuria: Secondary | ICD-10-CM | POA: Diagnosis not present

## 2020-08-22 DIAGNOSIS — Z923 Personal history of irradiation: Secondary | ICD-10-CM | POA: Diagnosis not present

## 2020-08-22 DIAGNOSIS — N390 Urinary tract infection, site not specified: Secondary | ICD-10-CM | POA: Diagnosis not present

## 2020-08-22 DIAGNOSIS — C55 Malignant neoplasm of uterus, part unspecified: Secondary | ICD-10-CM

## 2020-08-22 LAB — URINALYSIS, ROUTINE W REFLEX MICROSCOPIC
Bilirubin Urine: NEGATIVE
Hgb urine dipstick: NEGATIVE
Ketones, ur: NEGATIVE
Nitrite: NEGATIVE
Specific Gravity, Urine: 1.015 (ref 1.000–1.030)
Total Protein, Urine: NEGATIVE
Urine Glucose: NEGATIVE
Urobilinogen, UA: 0.2 (ref 0.0–1.0)
pH: 5 (ref 5.0–8.0)

## 2020-08-22 NOTE — Patient Instructions (Signed)
Everything on your exam was normal today.  I will see you back in 3 months.  Please call in June to get a visit scheduled with me for early July.  If anything changes in the meantime, please call.  I will either call you or released your blood results on Monday to you in my chart.  If your blood levels are significantly lower than normal, we will talk about a blood transfusion.  Otherwise, I would continue to use iron and vitamins you have been using.  In terms of imaging, we will plan to get a repeat CT scan in August.

## 2020-08-22 NOTE — Progress Notes (Signed)
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  Laboratory & Radiologic Studies: None new  Assessment & Plan: [name] is a 70 y.o. 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  The patient is recovering well from recent surgery. Giving debilitating fatigue, I suggested we get a CBC. She will come in on Monday to have this drawn.  In terms of her cancer, given no survival benefit for local radiation in LMS, we discussed its use if resection margins are positive/close (to decrease risk of local recurrence). Since her margins were negative, I do not recommend radiation to her resection bed. Similarly, I recommend that we save chemotherapy for future recurrence (especially one in which surgery is not feasible or complete cytoreduction cannot be achieved or diffusely metastatic disease is present).   I will plan to see [name] in 3 months for follow-up and will repeat imaging in August.   33 minutes of total time was spent for this patient encounter, including preparation, face-to-face counseling with the patient and coordination of care, and documentation of the encounter.  Jeral Pinch, MD  Division of Gynecologic Oncology  Department of Obstetrics and Gynecology   [facility]

## 2020-08-24 ENCOUNTER — Other Ambulatory Visit: Payer: Self-pay | Admitting: Internal Medicine

## 2020-08-24 DIAGNOSIS — R300 Dysuria: Secondary | ICD-10-CM

## 2020-08-25 ENCOUNTER — Other Ambulatory Visit: Payer: Self-pay

## 2020-08-25 ENCOUNTER — Inpatient Hospital Stay: Payer: Medicare Other

## 2020-08-25 ENCOUNTER — Ambulatory Visit (INDEPENDENT_AMBULATORY_CARE_PROVIDER_SITE_OTHER): Payer: Medicare Other | Admitting: Psychology

## 2020-08-25 DIAGNOSIS — F4321 Adjustment disorder with depressed mood: Secondary | ICD-10-CM

## 2020-08-25 DIAGNOSIS — C55 Malignant neoplasm of uterus, part unspecified: Secondary | ICD-10-CM

## 2020-08-25 DIAGNOSIS — Z923 Personal history of irradiation: Secondary | ICD-10-CM | POA: Diagnosis not present

## 2020-08-25 LAB — CBC (CANCER CENTER ONLY)
HCT: 35 % — ABNORMAL LOW (ref 36.0–46.0)
Hemoglobin: 11.6 g/dL — ABNORMAL LOW (ref 12.0–15.0)
MCH: 31.3 pg (ref 26.0–34.0)
MCHC: 33.1 g/dL (ref 30.0–36.0)
MCV: 94.3 fL (ref 80.0–100.0)
Platelet Count: 376 10*3/uL (ref 150–400)
RBC: 3.71 MIL/uL — ABNORMAL LOW (ref 3.87–5.11)
RDW: 14 % (ref 11.5–15.5)
WBC Count: 7.1 10*3/uL (ref 4.0–10.5)
nRBC: 0 % (ref 0.0–0.2)

## 2020-08-25 LAB — SAMPLE TO BLOOD BANK

## 2020-08-26 ENCOUNTER — Ambulatory Visit: Payer: Medicare Other | Admitting: Internal Medicine

## 2020-09-01 ENCOUNTER — Encounter (HOSPITAL_COMMUNITY): Payer: Medicare Other

## 2020-09-01 LAB — ACID FAST CULTURE WITH REFLEXED SENSITIVITIES (MYCOBACTERIA): Acid Fast Culture: NEGATIVE

## 2020-09-03 ENCOUNTER — Ambulatory Visit: Payer: Medicare Other | Admitting: Internal Medicine

## 2020-09-03 ENCOUNTER — Ambulatory Visit (INDEPENDENT_AMBULATORY_CARE_PROVIDER_SITE_OTHER): Payer: Medicare Other | Admitting: Psychology

## 2020-09-03 DIAGNOSIS — F4321 Adjustment disorder with depressed mood: Secondary | ICD-10-CM | POA: Diagnosis not present

## 2020-09-04 DIAGNOSIS — H35373 Puckering of macula, bilateral: Secondary | ICD-10-CM | POA: Diagnosis not present

## 2020-09-04 DIAGNOSIS — H04123 Dry eye syndrome of bilateral lacrimal glands: Secondary | ICD-10-CM | POA: Diagnosis not present

## 2020-09-04 DIAGNOSIS — Z961 Presence of intraocular lens: Secondary | ICD-10-CM | POA: Diagnosis not present

## 2020-09-04 DIAGNOSIS — H35342 Macular cyst, hole, or pseudohole, left eye: Secondary | ICD-10-CM | POA: Diagnosis not present

## 2020-09-04 DIAGNOSIS — H52213 Irregular astigmatism, bilateral: Secondary | ICD-10-CM | POA: Diagnosis not present

## 2020-09-05 ENCOUNTER — Other Ambulatory Visit: Payer: Self-pay

## 2020-09-05 ENCOUNTER — Encounter (HOSPITAL_COMMUNITY)
Admission: RE | Admit: 2020-09-05 | Discharge: 2020-09-05 | Disposition: A | Discharge status: Home / Self Care | Payer: Medicare Other | Source: Ambulatory Visit | Attending: Nephrology | Admitting: Nephrology

## 2020-09-05 ENCOUNTER — Ambulatory Visit (HOSPITAL_COMMUNITY)
Admission: RE | Admit: 2020-09-05 | Discharge: 2020-09-05 | Disposition: A | Discharge status: Home / Self Care | Payer: Medicare Other | Source: Ambulatory Visit | Attending: Nephrology | Admitting: Nephrology

## 2020-09-05 DIAGNOSIS — E211 Secondary hyperparathyroidism, not elsewhere classified: Secondary | ICD-10-CM | POA: Diagnosis not present

## 2020-09-05 DIAGNOSIS — N2581 Secondary hyperparathyroidism of renal origin: Secondary | ICD-10-CM | POA: Insufficient documentation

## 2020-09-05 DIAGNOSIS — Z87442 Personal history of urinary calculi: Secondary | ICD-10-CM | POA: Diagnosis not present

## 2020-09-05 MED ORDER — TECHNETIUM TC 99M SESTAMIBI - CARDIOLITE
26.1000 | Freq: Once | INTRAVENOUS | Status: AC | PRN
  Administered 2020-09-05: 11:00:00 26.1 via INTRAVENOUS

## 2020-09-10 ENCOUNTER — Other Ambulatory Visit: Payer: Self-pay

## 2020-09-10 ENCOUNTER — Encounter: Payer: Self-pay | Admitting: Internal Medicine

## 2020-09-10 ENCOUNTER — Ambulatory Visit (INDEPENDENT_AMBULATORY_CARE_PROVIDER_SITE_OTHER): Payer: Medicare Other | Admitting: Internal Medicine

## 2020-09-10 VITALS — BP 104/82 | HR 82 | Temp 98.1°F | Ht 62.0 in | Wt 158.4 lb

## 2020-09-10 DIAGNOSIS — D509 Iron deficiency anemia, unspecified: Secondary | ICD-10-CM | POA: Diagnosis not present

## 2020-09-10 DIAGNOSIS — I959 Hypotension, unspecified: Secondary | ICD-10-CM | POA: Diagnosis not present

## 2020-09-10 DIAGNOSIS — M797 Fibromyalgia: Secondary | ICD-10-CM

## 2020-09-10 DIAGNOSIS — I10 Essential (primary) hypertension: Secondary | ICD-10-CM | POA: Diagnosis not present

## 2020-09-10 DIAGNOSIS — E039 Hypothyroidism, unspecified: Secondary | ICD-10-CM

## 2020-09-10 DIAGNOSIS — R5381 Other malaise: Secondary | ICD-10-CM | POA: Diagnosis not present

## 2020-09-10 NOTE — Assessment & Plan Note (Addendum)
If low BP: hold Maxzide, stop Lisinopril, use Lasix as needed for swelling

## 2020-09-10 NOTE — Assessment & Plan Note (Signed)
Chronic Coping ok

## 2020-09-10 NOTE — Progress Notes (Signed)
Subjective:  Patient ID: [name], female    DOB: 1951-04-10  Age: 70 y.o. MRN: [id number]  CC: Follow-up (3 MONTH F/U)   HPI [name] presents for R CP after her recent lung surgery C/o fatigue, SOB  Outpatient Medications Prior to Visit  Medication Sig Dispense Refill  . acetaminophen (TYLENOL) 325 MG tablet Take 650 mg by mouth every 6 (six) hours as needed for moderate pain.     Marland Kitchen ALPRAZolam (XANAX) 0.5 MG tablet Take 1 tablet (0.5 mg total) by mouth 2 (two) times daily as needed for anxiety. 60 tablet 1  . aspirin EC 81 MG tablet Take 81 mg by mouth daily.    . busPIRone (BUSPAR) 30 MG tablet Take 30 mg by mouth 2 (two) times daily.    . colesevelam (WELCHOL) 625 MG tablet Take 2 tablets (1,250 mg total) by mouth 2 (two) times daily with a meal. 120 tablet 11  . D-Mannose 500 MG CAPS Take 500 mg by mouth 2 (two) times daily. WITH CRANBERRY & DANDELION EXTRACT    . esomeprazole (NEXIUM) 40 MG capsule TAKE 1 CAPSULE BY MOUTH EVERY MORNING (Patient taking differently: Take 40 mg by mouth daily.) 30 capsule 0  . estradiol (ESTRACE) 1 MG tablet Take 1 tablet (1 mg total) by mouth daily. 90 tablet 3  . FLUoxetine (PROZAC) 40 MG capsule Take 1 capsule (40 mg total) by mouth every morning. 90 capsule 1  . furosemide (LASIX) 20 MG tablet TAKE 1-2 TABLETS (20-40 MG TOTAL) BY MOUTH DAILY. 180 tablet 1  . ipratropium-albuterol (DUONEB) 0.5-2.5 (3) MG/3ML SOLN Take 3 mLs by nebulization every 6 (six) hours as needed (Asthma).    . iron polysaccharides (NIFEREX) 150 MG capsule TAKE 1 CAPSULE BY MOUTH TWICE A DAY (Patient taking differently: Take 150 mg by mouth 2 (two) times daily.) 180 capsule 3  . levothyroxine (SYNTHROID) 100 MCG tablet TAKE 1 TABLET BY MOUTH EVERY DAY BEFORE BREAKFAST (Patient taking differently: Take 100 mcg by mouth daily before breakfast.) 90 tablet 2  . lisinopril (ZESTRIL) 20 MG tablet Take 0.5 tablets (10 mg total) by mouth daily. 45 tablet 3  .  mirtazapine (REMERON) 30 MG tablet Take 1 tablet (30 mg total) by mouth at bedtime. 90 tablet 1  . Multiple Vitamins-Minerals (MULTIVITAMIN WITH MINERALS) tablet Take 1 tablet by mouth daily.    . Omega-3 Fatty Acids (FISH OIL) 1200 MG CAPS Take 1,200 mg by mouth daily.     . ondansetron (ZOFRAN) 4 MG tablet Take 1 tablet (4 mg total) by mouth every 8 (eight) hours as needed for nausea or vomiting. 20 tablet 0  . Polyethyl Glycol-Propyl Glycol (SYSTANE OP) Place 1 drop into both eyes 2 (two) times daily as needed (dry eyes).     . prochlorperazine (COMPAZINE) 10 MG tablet Take 1 tablet (10 mg total) by mouth every 6 (six) hours as needed for nausea or vomiting. 30 tablet 0  . progesterone (PROMETRIUM) 100 MG capsule Take 100 mg by mouth at bedtime.    . SUMAtriptan (IMITREX) 100 MG tablet Take 0.5 tablets (50 mg total) by mouth every 2 (two) hours as needed for migraine.    . triamterene-hydrochlorothiazide (MAXZIDE-25) 37.5-25 MG tablet Take 1 tablet by mouth daily.     No facility-administered medications prior to visit.    ROS: Review of Systems  Constitutional: Positive for fatigue.  Respiratory: Positive for cough and shortness of breath.   Genitourinary: Positive for frequency.  Musculoskeletal:  Positive for back pain. Negative for arthralgias and myalgias.  Neurological: Positive for weakness.  Psychiatric/Behavioral: Negative for suicidal ideas. The patient is nervous/anxious.     Objective:  BP 104/82 (BP Location: Left Arm)   Pulse 82   Temp 98.1 F (36.7 C) (Oral)   Ht 5\' 2"  (1.575 m)   Wt 158 lb 6.4 oz (71.8 kg)   SpO2 97%   BMI 28.97 kg/m   BP Readings from Last 3 Encounters:  09/10/20 104/82  08/22/20 121/82  08/05/20 103/62    Wt Readings from Last 3 Encounters:  09/10/20 158 lb 6.4 oz (71.8 kg)  08/22/20 163 lb 12.8 oz (74.3 kg)  08/05/20 158 lb (71.7 kg)    Physical Exam Constitutional:      General: She is not in acute distress.    Appearance: She is  well-developed. She is obese.  HENT:     Head: Normocephalic.     Right Ear: External ear normal.     Left Ear: External ear normal.     Nose: Nose normal.  Eyes:     General:        Right eye: No discharge.        Left eye: No discharge.     Conjunctiva/sclera: Conjunctivae normal.     Pupils: Pupils are equal, round, and reactive to light.  Neck:     Thyroid: No thyromegaly.     Vascular: No JVD.     Trachea: No tracheal deviation.  Cardiovascular:     Rate and Rhythm: Normal rate and regular rhythm.     Heart sounds: Normal heart sounds.  Pulmonary:     Effort: No respiratory distress.     Breath sounds: No stridor. No wheezing.  Abdominal:     General: Bowel sounds are normal. There is no distension.     Palpations: Abdomen is soft. There is no mass.     Tenderness: There is no abdominal tenderness. There is no guarding or rebound.  Musculoskeletal:        General: No tenderness.     Cervical back: Normal range of motion and neck supple.  Lymphadenopathy:     Cervical: No cervical adenopathy.  Skin:    Findings: No erythema or rash.  Neurological:     Mental Status: She is oriented to person, place, and time.     Cranial Nerves: No cranial nerve deficit.     Motor: No abnormal muscle tone.     Coordination: Coordination normal.     Deep Tendon Reflexes: Reflexes normal.  Psychiatric:        Behavior: Behavior normal.        Thought Content: Thought content normal.        Judgment: Judgment normal.     Lab Results  Component Value Date   WBC 7.1 08/25/2020   HGB 11.6 (L) 08/25/2020   HCT 35.0 (L) 08/25/2020   PLT 376 08/25/2020   GLUCOSE 90 07/19/2020   CHOL 263 (H) 10/18/2018   TRIG 271.0 (H) 10/18/2018   HDL 45.40 10/18/2018   LDLDIRECT 160.0 10/18/2018   LDLCALC UNABLE TO CALCULATE IF TRIGLYCERIDE OVER 400 mg/dL 08/26/2017   ALT 56 (H) 07/19/2020   AST 48 (H) 07/19/2020   NA 137 07/19/2020   K 4.1 07/19/2020   CL 108 07/19/2020   CREATININE 1.34  (H) 07/19/2020   BUN 23 07/19/2020   CO2 21 (L) 07/19/2020   TSH 1.26 04/14/2020   INR 1.0 07/15/2020  HGBA1C 5.5 06/27/2017    NM Parathyroid W/Spect  Result Date: 09/05/2020 CLINICAL DATA:  Secondary hyperparathyroidism, elevated serum calcium, kidney stones EXAM: NM PARATHYROID SCINTIGRAPHY AND SPECT IMAGING TECHNIQUE: Following intravenous administration of radiopharmaceutical, early and 2-hour delayed planar images were obtained in the anterior projection. Delayed triplanar SPECT images were also obtained at 2 hours. RADIOPHARMACEUTICALS:  25.5 mCi Tc-24m Sestamibi IV COMPARISON:  None FINDINGS: Planar imaging: Initial normal sestamibi distribution in both thyroid lobes, though RIGHT lobe appears longer than LEFT. Normal washout of tracer on delayed images. No definite focal areas of abnormal sestamibi retention identified. SPECT imaging: Questionable focus of abnormal sestamibi retention at the RIGHT inferior parathyroid gland versus asymmetrically long RIGHT thyroid lobe. No additional sites of abnormal sestamibi retention identified. No ectopic localization of tracer in the mediastinum. IMPRESSION: Questionable focus of abnormal sestamibi retention at the RIGHT inferior parathyroid gland. Remainder of exam normal. Electronically Signed   By: Lavonia Dana M.D.   On: 09/05/2020 17:02    Assessment & Plan:    Walker Kehr, MD

## 2020-09-10 NOTE — Assessment & Plan Note (Signed)
Start PT when ready after eye surgery

## 2020-09-10 NOTE — Patient Instructions (Addendum)
If low BP: hold Maxzide, stop Lisinopril, use Lasix as needed for swelling  Call me when you are ready for PT

## 2020-09-10 NOTE — Assessment & Plan Note (Signed)
On Synthroid

## 2020-09-10 NOTE — Assessment & Plan Note (Signed)
If low BP: hold Maxzide, stop Lisinopril, use Lasix as needed for swelling

## 2020-09-12 DIAGNOSIS — H35371 Puckering of macula, right eye: Secondary | ICD-10-CM | POA: Diagnosis not present

## 2020-09-12 DIAGNOSIS — H43813 Vitreous degeneration, bilateral: Secondary | ICD-10-CM | POA: Diagnosis not present

## 2020-09-12 DIAGNOSIS — H35342 Macular cyst, hole, or pseudohole, left eye: Secondary | ICD-10-CM | POA: Diagnosis not present

## 2020-09-17 ENCOUNTER — Ambulatory Visit (INDEPENDENT_AMBULATORY_CARE_PROVIDER_SITE_OTHER): Payer: Medicare Other | Admitting: Psychology

## 2020-09-17 DIAGNOSIS — F4321 Adjustment disorder with depressed mood: Secondary | ICD-10-CM

## 2020-09-18 DIAGNOSIS — H35342 Macular cyst, hole, or pseudohole, left eye: Secondary | ICD-10-CM | POA: Diagnosis not present

## 2020-09-19 DIAGNOSIS — H35342 Macular cyst, hole, or pseudohole, left eye: Secondary | ICD-10-CM | POA: Diagnosis not present

## 2020-09-25 DIAGNOSIS — H35371 Puckering of macula, right eye: Secondary | ICD-10-CM | POA: Diagnosis not present

## 2020-09-25 DIAGNOSIS — H35342 Macular cyst, hole, or pseudohole, left eye: Secondary | ICD-10-CM | POA: Diagnosis not present

## 2020-09-26 ENCOUNTER — Telehealth: Payer: Self-pay | Admitting: Internal Medicine

## 2020-09-26 NOTE — Progress Notes (Signed)
  Chronic Care Management   Note  09/26/2020 Name: CHAE SHUSTER MRN: [id number] DOB: [birth year]  Vedia Coffer is a 70 y.o. year old female who is a primary care patient of Plotnikov, [name], MD. I reached out to Vedia Coffer by phone today in response to a referral sent by Ms. [name]-Marks's PCP, Plotnikov, [name], MD.   Ms. Mifflin was given information about Chronic Care Management services today including:  1. CCM service includes personalized support from designated clinical staff supervised by her physician, including individualized plan of care and coordination with other care providers 2. 24/7 contact phone numbers for assistance for urgent and routine care needs. 3. Service will only be billed when office clinical staff spend 20 minutes or more in a month to coordinate care. 4. Only one practitioner may furnish and bill the service in a calendar month. 5. The patient may stop CCM services at any time (effective at the end of the month) by phone call to the office staff.   Patient agreed to services and verbal consent obtained.   Follow up plan:   [place]

## 2020-10-01 ENCOUNTER — Ambulatory Visit (INDEPENDENT_AMBULATORY_CARE_PROVIDER_SITE_OTHER): Payer: Medicare Other | Admitting: Psychology

## 2020-10-01 DIAGNOSIS — F4321 Adjustment disorder with depressed mood: Secondary | ICD-10-CM | POA: Diagnosis not present

## 2020-10-01 NOTE — Progress Notes (Deleted)
Chronic Care Management Pharmacy Note  10/01/2020 Name:  ALEXIYA FRANQUI MRN:  [id number] DOB:  05-May-1951  Subjective: [name] is an 70 y.o. year old female who is a primary patient of Plotnikov, [name], MD.  The CCM team was consulted for assistance with disease management and care coordination needs.    Engaged with patient face to face for initial visit in response to provider referral for pharmacy case management and/or care coordination services.   Consent to Services:  The patient was given the following information about Chronic Care Management services today, agreed to services, and gave verbal consent: 1. CCM service includes personalized support from designated clinical staff supervised by the primary care provider, including individualized plan of care and coordination with other care providers 2. 24/7 contact phone numbers for assistance for urgent and routine care needs. 3. Service will only be billed when office clinical staff spend 20 minutes or more in a month to coordinate care. 4. Only one practitioner may furnish and bill the service in a calendar month. 5.The patient may stop CCM services at any time (effective at the end of the month) by phone call to the office staff. 6. The patient will be responsible for cost sharing (co-pay) of up to 20% of the service fee (after annual deductible is met). Patient agreed to services and consent obtained.  Patient Care Team: Plotnikov, [name], MD as PCP - General (Internal Medicine) Oren Binet, PhD as Consulting Physician (Psychology) Norma Fredrickson, MD as Consulting Physician (Psychiatry) Calvert Cantor, MD as Consulting Physician (Ophthalmology) Neldon Mc, Donnamarie Poag, MD as Consulting Physician (Allergy and Immunology) Alexis Frock, MD as Consulting Physician (Urology) Lafonda Mosses, MD as Consulting Physician (Gynecologic Oncology) Gery Pray, MD as Consulting Physician (Radiation  Oncology) Mauri Pole, MD as Consulting Physician (Gastroenterology) Rosita Fire, MD as Consulting Physician (Nephrology) Melrose Nakayama, MD as Consulting Physician (Cardiothoracic Surgery) Sherlynn Stalls, MD as Consulting Physician (Ophthalmology) Delice Bison Darnelle Maffucci, [facility] (Pharmacist) Tomasa Blase, [facility] as Pharmacist (Pharmacist)  Recent office visits: 09/10/2020 - PCP f/u after lung surgery - hypotensive - stopped lisinopril, prochlorperazine also removed from medication list at appointment   07/04/2020 - Jodi Mourning - televideo visit - UTI - prescribed Cipro x 10 days   Recent consult visits: 08/22/2020 - OB/GYN - Follow-up after recent surgery for resection of a pulmonary lesion / complaints of fatigue CBC ordered, 3 month follow up with imaging ordered   08/05/2020- Dr. Roxan Hockey -[facility] - f/u regarding right upper lobe wedge resection surgery 07/17/2020 - doing well in terms of recovery per note   05/26/2020 - Dr. Roxan Hockey - decided to proceed with navigational bronchoscopy of right upper lober for diagnostic purposes  05/21/2020 - Dr. Bettina Gavia - Cardiology - no changes at this appointment  Hospital visits: 07/17/2020 - 07/19/2020 - Admission for surgery on right upper lung lobe nodule (Dr. Roxan Hockey)  Objective:  Lab Results  Component Value Date   CREATININE 1.34 (H) 07/19/2020   BUN 23 07/19/2020   GFR 36.00 (L) 05/05/2020   GFRNONAA 43 (L) 07/19/2020   GFRAA 50 (L) 11/26/2019   NA 137 07/19/2020   K 4.1 07/19/2020   CALCIUM 9.6 07/19/2020   CO2 21 (L) 07/19/2020   GLUCOSE 90 07/19/2020    Lab Results  Component Value Date/Time   HGBA1C 5.5 06/27/2017 01:15 PM   HGBA1C 5.5 09/23/2008 04:50 PM   GFR 36.00 (L) 05/05/2020 03:31 PM  GFR 27.51 (L) 04/14/2020 02:46 PM    Last diabetic Eye exam:  No results found for: HMDIABEYEEXA  Last diabetic Foot exam:  No results found for: HMDIABFOOTEX   Lab Results  Component  Value Date   CHOL 263 (H) 10/18/2018   HDL 45.40 10/18/2018   LDLCALC UNABLE TO CALCULATE IF TRIGLYCERIDE OVER 400 mg/dL 08/26/2017   LDLDIRECT 160.0 10/18/2018   TRIG 271.0 (H) 10/18/2018   CHOLHDL 6 10/18/2018    Hepatic Function Latest Ref Rng & Units 07/19/2020 07/15/2020 06/12/2020  Total Protein 6.5 - 8.1 g/dL 5.4(L) 6.6 6.8  Albumin 3.5 - 5.0 g/dL 3.0(L) 3.7 3.6  AST 15 - 41 U/L 48(H) 30 26  ALT 0 - 44 U/L 56(H) 27 20  Alk Phosphatase 38 - 126 U/L 68 98 100  Total Bilirubin 0.3 - 1.2 mg/dL 0.6 0.8 0.7  Bilirubin, Direct 0.0 - 0.3 mg/dL - - -    Lab Results  Component Value Date/Time   TSH 1.26 04/14/2020 02:46 PM   TSH 1.20 08/27/2019 05:08 PM   FREET4 1.08 10/19/2018 04:28 PM   FREET4 1.05 10/15/2016 11:59 AM    CBC Latest Ref Rng & Units 08/25/2020 07/19/2020 07/18/2020  WBC 4.0 - 10.5 K/uL 7.1 8.9 9.6  Hemoglobin 12.0 - 15.0 g/dL 11.6(L) 8.9(L) 9.0(L)  Hematocrit 36.0 - 46.0 % 35.0(L) 26.6(L) 27.5(L)  Platelets 150 - 400 K/uL 376 226 227    Lab Results  Component Value Date/Time   VD25OH 53.01 06/27/2017 01:15 PM   VD25OH 69.02 12/29/2016 08:50 AM    Clinical ASCVD: No  The 10-year ASCVD risk score Mikey Bussing DC Jr., et al., 2013) is: 9.5%   Values used to calculate the score:     Age: 60 years     Sex: Female     Is Non-Hispanic African American: No     Diabetic: No     Tobacco smoker: No     Systolic Blood Pressure: 841 mmHg     Is BP treated: Yes     HDL Cholesterol: 45.4 mg/dL     Total Cholesterol: 263 mg/dL    Depression screen [facility] 2/9 12/06/2019  Decreased Interest 0  Down, Depressed, Hopeless 0  PHQ - 2 Score 0  Altered sleeping 0  Tired, decreased energy 0  Change in appetite 0  Feeling bad or failure about yourself  0  Trouble concentrating 0  Moving slowly or fidgety/restless 0  Suicidal thoughts 0  PHQ-9 Score 0  Difficult doing work/chores Not difficult at all  Some recent data might be hidden      Social History   Tobacco Use  Smoking  Status Never Smoker  Smokeless Tobacco Never Used   BP Readings from Last 3 Encounters:  09/10/20 104/82  08/22/20 121/82  08/05/20 103/62   Pulse Readings from Last 3 Encounters:  09/10/20 82  08/22/20 (!) 103  08/05/20 92   Wt Readings from Last 3 Encounters:  09/10/20 158 lb 6.4 oz (71.8 kg)  08/22/20 163 lb 12.8 oz (74.3 kg)  08/05/20 158 lb (71.7 kg)   BMI Readings from Last 3 Encounters:  09/10/20 28.97 kg/m  08/22/20 29.96 kg/m  08/05/20 28.90 kg/m    Assessment/Interventions: Review of patient past medical history, allergies, medications, health status, including review of consultants reports, laboratory and other test data, was performed as part of comprehensive evaluation and provision of chronic care management services.   SDOH:  (Social Determinants of Health) assessments and interventions performed: {yes/no:20286}  SDOH Screenings   Alcohol Screen: Not on file  Depression (PHQ2-9): Low Risk   . PHQ-2 Score: 0  Financial Resource Strain: Not on file  Food Insecurity: Not on file  Housing: Not on file  Physical Activity: Not on file  Social Connections: Not on file  Stress: Not on file  Tobacco Use: Low Risk   . Smoking Tobacco Use: Never Smoker  . Smokeless Tobacco Use: Never Used  Transportation Needs: Not on file    CCM Care Plan  Allergies  Allergen Reactions  . Buprenorphine Hcl Other (See Comments)    Severe headache, confusion  . Morphine And Related Other (See Comments)    Severe headache  . Buprenorphine Other (See Comments)  . Cephalexin Nausea And Vomiting and Nausea Only    Other reaction(s): Vomiting  . Other Other (See Comments)    Trees & Grass = allergic symptoms  . Codeine Other (See Comments)    Disorientation  . Dust Mite Extract Cough  . Molds & Smuts Cough    Medications Reviewed Today    Reviewed by Cassandria Anger, MD (Physician) on 09/10/20 at [facility] List Status: <None>  Medication Order Taking? Sig  Documenting Provider Last Dose Status Informant  acetaminophen (TYLENOL) 325 MG tablet [id number] Yes Take 650 mg by mouth every 6 (six) hours as needed for moderate pain.  [provider] Taking Active Self  ALPRAZolam Duanne Moron) 0.5 MG tablet [id number] Yes Take 1 tablet (0.5 mg total) by mouth 2 (two) times daily as needed for anxiety. Plotnikov, [name], MD Taking Active   aspirin EC 81 MG tablet [id number] Yes Take 81 mg by mouth daily. [provider] Taking Active Self  busPIRone (BUSPAR) 30 MG tablet [id number] Yes Take 30 mg by mouth 2 (two) times daily. [provider] Taking Active Self  colesevelam (WELCHOL) 625 MG tablet [id number] Yes Take 2 tablets (1,250 mg total) by mouth 2 (two) times daily with a meal. Plotnikov, [name], MD Taking Active Self  D-Mannose 500 MG CAPS [id number] Yes Take 500 mg by mouth 2 (two) times daily. WITH CRANBERRY & DANDELION EXTRACT [provider] Taking Active Self  esomeprazole (NEXIUM) 40 MG capsule [id number] Yes TAKE 1 CAPSULE BY MOUTH EVERY MORNING  Patient taking differently: Take 40 mg by mouth daily.   Kozlow, Donnamarie Poag, MD Taking Active   estradiol (ESTRACE) 1 MG tablet [id number] Yes Take 1 tablet (1 mg total) by mouth daily. Niel Hummer, NP Taking Active Self       Patient not taking:      Discontinued 11/05/19 0627   FLUoxetine (PROZAC) 40 MG capsule [id number] Yes Take 1 capsule (40 mg total) by mouth every morning. Plotnikov, [name], MD Taking Active Self  furosemide (LASIX) 20 MG tablet [id number] Yes TAKE 1-2 TABLETS (20-40 MG TOTAL) BY MOUTH DAILY. Plotnikov, [name], MD Taking Active   ipratropium-albuterol (DUONEB) 0.5-2.5 (3) MG/3ML SOLN [id number] Yes Take 3 mLs by nebulization every 6 (six) hours as needed (Asthma). [provider] Taking Active Self  iron polysaccharides (NIFEREX) 150 MG capsule [id number] Yes TAKE 1 CAPSULE BY MOUTH TWICE A DAY  Patient taking differently: Take 150 mg by  mouth 2 (two) times daily.   Plotnikov, [name], MD Taking Active   levothyroxine (SYNTHROID) 100 MCG tablet [id number] Yes TAKE 1 TABLET BY MOUTH EVERY DAY BEFORE BREAKFAST  Patient taking differently: Take 100 mcg by mouth daily before breakfast.   Plotnikov, [name], MD  Taking Active   mirtazapine (REMERON) 30 MG tablet [id number] Yes Take 1 tablet (30 mg total) by mouth at bedtime. Plotnikov, [name], MD Taking Active Self  Multiple Vitamins-Minerals (MULTIVITAMIN WITH MINERALS) tablet [id number] Yes Take 1 tablet by mouth daily. Niel Hummer, NP Taking Active Self  Omega-3 Fatty Acids (FISH OIL) 1200 MG CAPS [id number] Yes Take 1,200 mg by mouth daily.  [provider] Taking Active Self  ondansetron (ZOFRAN) 4 MG tablet [id number] Yes Take 1 tablet (4 mg total) by mouth every 8 (eight) hours as needed for nausea or vomiting. Gery Pray, MD Taking Active   Polyethyl Glycol-Propyl Glycol [facility]) [id number] Yes Place 1 drop into both eyes 2 (two) times daily as needed (dry eyes).  [provider] Taking Active Self  progesterone (PROMETRIUM) 100 MG capsule [id number] Yes Take 100 mg by mouth at bedtime. [provider] Taking Active Self  SUMAtriptan (IMITREX) 100 MG tablet [id number] Yes Take 0.5 tablets (50 mg total) by mouth every 2 (two) hours as needed for migraine. Antony Odea, PA-C Taking Active   triamterene-hydrochlorothiazide (MAXZIDE-25) 37.5-25 MG tablet [id number] Yes Take 1 tablet by mouth daily. [provider] Taking Active Self          Patient Active Problem List   Diagnosis Date Noted  . Low BP 09/10/2020  . Physical deconditioning 09/10/2020  . Osteopenia 03/24/2020  . Genital herpes simplex 03/24/2020  . Chronic fatigue syndrome 03/24/2020  . Bipolar I disorder ([place]) 03/24/2020  . Wears glasses   . Urgency of urination   . Tuberculosis   . Status post chemotherapy   . S/P dilatation of esophageal stricture   .  Renal calculus, bilateral   . Positive reaction to tuberculin skin test   . Pneumonia   . Mild persistent asthma   . IDA (iron deficiency anemia)   . Hx of transfusion of packed red blood cells   . History of syncope   . History of positive PPD   . History of kidney stones   . History of DVT (deep vein thrombosis)   . History of concussion   . GERD (gastroesophageal reflux disease)   . GAD (generalized anxiety disorder)   . CKD (chronic kidney disease), stage III ([place])   . Chronically dry eyes   . Chronic cystitis   . Chronic bronchitis ([place])   . Arthritis   . 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 Pelvic mass in female 09/20/2019  . Ureteral stone with hydronephrosis 09/04/2019  . Edema 08/27/2019  . Bladder pain 08/2019  . Dysuria 05/17/2019  . Suspected 2019 novel coronavirus infection 04/25/2019  . Urinary urgency 03/06/2019  . Diarrhea 03/06/2019  . 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 Dyspnea on exertion 08/25/2017  . Chest pain in adult 07/13/2017  . HTN (hypertension) 05/31/2017  . Anemia 05/31/2017  . Leg pain, anterior, left 12/15/2016  . Paresthesia 12/15/2016  . Syncope 10/13/2016  . Migraine headache 10/13/2016  . Asthma 10/13/2016  . Anxiety 10/13/2016  . Hyperlipidemia 10/13/2016  . UTI (urinary tract infection) 10/13/2016  . Hypothyroidism 10/13/2016  . Laryngopharyngeal reflux (LPR) 06/15/2016  . Acute upper respiratory infection 05/05/2016  . Degenerative disc disease, cervical 11/29/2015  . IBS (irritable bowel syndrome) 11/24/2015  . Facet arthritis of cervical region 10/16/2015  . Chronic urinary tract infection 07/22/2015  . Malaise and fatigue 04/16/2015  . Severe episode of recurrent major depressive disorder, without psychotic features ([place]) 12/18/2014  . Post concussion syndrome  10/28/2014  . 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 Depression 10/25/2014  . Major depressive disorder, recurrent, severe without psychotic  features ([place])   . 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 Concussion w/o coma 09/18/2014  . Irritable bowel syndrome with diarrhea 09/12/2014  . 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 Cold intolerance 03/26/2014  . Cough variant asthma  vs UACS/ irritable larynx  03/26/2014  . Abdominal pain 03/26/2014  . Positive skin test 03/26/2014  . Adjustment disorder with anxiety 02/07/2014  . Cervical pain 12/05/2013  . Fibromyalgia 12/05/2013  . Cephalalgia 12/05/2013  . Adult hypothyroidism 12/05/2013  . Adaptive colitis 12/05/2013  . Menopause 12/05/2013  . Mild cognitive disorder 12/05/2013  . Feeling bilious 12/05/2013  . 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 Lung mass 09/10/2013  . Lung nodule, solitary 09/10/2013  . Abnormal chest x-ray   . Unspecified hypothyroidism 04/25/2013  . Vitamin D deficiency 04/25/2013  . 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 Female genuine stress incontinence 04/12/2012  . Healthcare maintenance 10/30/2010  . MUSCLE PAIN 07/03/2008  . BIPLR I MOST RECENT EPIS DPRS SEV NO PSYCHOT BHV 02/14/2008  . DEEP PHLEBOTHROMBOSIS PP UNSPEC AS EPIS CARE 02/14/2008  . Bipolar affective disorder, current episode depressed ([place]) 02/14/2008  . Deep phlebothrombosis, postpartum 02/14/2008  . HYPERLIPIDEMIA 03/04/2007  . ALLERGIC RHINITIS 03/04/2007  . GERD 03/04/2007  . IRRITABLE BOWEL SYNDROME, HX OF 03/04/2007    Immunization History  Administered Date(s) Administered  . Fluad Quad(high Dose 65+) 04/14/2020  . H1N1 06/14/2008  . Influenza Whole 02/14/2008, 02/17/2009, 03/17/2013  . Influenza, High Dose Seasonal PF 04/02/2015, 05/31/2017, 03/16/2018, 12/27/2018  . Influenza-Unspecified 03/24/2016  . MMR 06/19/2009  . PFIZER(Purple Top)SARS-COV-2 Vaccination 06/05/2019, 06/23/2019, 02/14/2020  . Pneumococcal Conjugate-13 05/05/2016  . Pneumococcal Polysaccharide-23 05/31/2017  . Td 05/17/2000,  06/19/2009  . Zoster 04/16/2014    Conditions to be addressed/monitored:  Hypertension, Hyperlipidemia, GERD, Asthma, Chronic Kidney Disease, Hypothyroidism, Depression, Anxiety, Allergic Rhinitis and Iron Deficieny Anemia, Vit D deficiency   There are no care plans that you recently modified to display for this patient.     Medication Assistance: {MEDASSISTANCEINFO:25044}  Patient's [facility] pharmacy [facility]  [id number] [address] [address]Phone: [phone]: [phone]  Uses pill box? {Yes or If no, why not?:20788} Pt endorses ***% compliance  Care Plan and Follow Up Patient Decision:  {FOLLOWUP:24991}  Plan: {CM FOLLOW UP PLAN:25073}  ***  Current Barriers:  . {pharmacybarriers:24917}  Pharmacist Clinical Goal(s):  .Marland KitchenPatient will {PHARMACYGOALCHOICES:24921} through collaboration with PharmD and provider.   Interventions: . 1:1 collaboration with Plotnikov, AEvie Lacks MD regarding development and update of comprehensive plan of care as evidenced by provider attestation and co-signature . Inter-disciplinary care team collaboration (see longitudinal plan of care) . Comprehensive medication review performed; medication list updated in electronic medical record  Hypertension (BP goal <140/90) -Controlled -Current treatment: . Triamterene-HCTZ 37.558m25mg - 1 tablet daily  . Furosemide 2068m 1-2 tablets daily prn  -Medications previously tried: lisinopril (stopped d/t hypotension), candesartan, amlodipine, coreg, HCTZ, benicar, bystolic, verapamil -Current home readings: *** -Current dietary habits: *** -Current exercise habits: *** -{ACTIONS;DENIES/REPORTS:21021675::"Denies"} hypotensive/hypertensive symptoms -Educated on {CCM BP Counseling:25124} -Counseled to monitor BP at home ***, document, and provide log at future appointments -{CCMPHARMDINTERVENTION:25122}  Hyperlipidemia: (LDL goal <  100ldl ) -{US controlled/uncontrolled:25276} -Current treatment: . Colesevelam 325m48m2 tablets BID  . Fish oils -  1281m daily  . ASA 870mdaily  -Medications previously tried: simvastatin   -Current dietary patterns: *** -Current exercise habits: *** -Educated on {CCM HLD Counseling:25126} -{CCMPHARMDINTERVENTION:25122}   Depression/Anxiety (Goal: Disease control / prevention of anxiety attacks ) -{US controlled/uncontrolled:25276} -Current treatment: . Alprazolam 0.37m61m 1 tablet BID prn  . Buspirone 69m50mD  . Fluoxetine 40mg94mly  . Mirtazapine 69mg 58my  -Medications previously tried/failed: wellbutrin, citalopram, trazodone  -PHQ9: 0 -GAD7: 0 -Connected with *** for mental health support -Educated on {CCM mental health counseling:25127} -{CCMPHARMDINTERVENTION:25122}   Hypothyroidism (Goal: prevention of disease progression) -Controlled -Current treatment  . Levothyroxine 100mcg 34my  -Medications previously tried: ***  -{CCMPHARMDINTERVENTION:25122}   GERD (Goal: Acid control / prevention of flares) -{US controlled/uncontrolled:25276} -Current treatment  . Esomeprazole 40mg da22m . Ondansetron 4mg q8h 637m  -Medications previously tried: Famotidine   -{CCMPHARMDINTERVENTION:25122}   Iron Deficiency Anemia / Vit D deficiency (Goal: Maintain appropriate vitamin/mineral levels ) -{US controlled/uncontrolled:25276} -Current treatment  . Multivitamin once daily  . Iron Polysaccharides 150mg - 1 80mule BID  -Medications previously tried: ***  -{CCMPHARMDINTERVENTION:25122}  Health Maintenance -Vaccine gaps: *** -Current therapy:  . D-Mannose 500mg - 2 c21mles daily  . Progesterone 100mg HS  . 80madiol 1mg daily  .53mmatriptin 100mg - 1/2 ta100m q2h prn migraine  . Systane OP - instill into both eye BID (for dry eyes) -Educated on {ccm supplement counseling:25128} -{CCM Patient satisfied:25129} -{CCMPHARMDINTERVENTION:25122}   Patient  Goals/Self-Care Activities . Patient will:  - {pharmacypatientgoals:24919}  Follow Up Plan: {CM FOLLOW UP PLAN:22241}MBWG:66599}

## 2020-10-06 ENCOUNTER — Ambulatory Visit: Payer: Medicare Other

## 2020-10-06 ENCOUNTER — Telehealth: Payer: Self-pay | Admitting: Internal Medicine

## 2020-10-06 DIAGNOSIS — R300 Dysuria: Secondary | ICD-10-CM

## 2020-10-06 NOTE — Telephone Encounter (Signed)
    Patient requesting order for urine test, painful urination  Please advise

## 2020-10-07 ENCOUNTER — Other Ambulatory Visit: Payer: Self-pay | Admitting: Thoracic Surgery (Cardiothoracic Vascular Surgery)

## 2020-10-07 ENCOUNTER — Ambulatory Visit: Payer: Self-pay | Admitting: Thoracic Surgery (Cardiothoracic Vascular Surgery)

## 2020-10-07 DIAGNOSIS — R918 Other nonspecific abnormal finding of lung field: Secondary | ICD-10-CM

## 2020-10-07 NOTE — Progress Notes (Signed)
cxr 

## 2020-10-07 NOTE — Telephone Encounter (Signed)
Notified pt MD ok UA.. entered order into epic.Marland KitchenJohny Chess

## 2020-10-07 NOTE — Addendum Note (Signed)
Addended by: Earnstine Regal on: 10/07/2020 09:37 AM   Modules accepted: Orders

## 2020-10-07 NOTE — Telephone Encounter (Signed)
Okay UA.  Thanks

## 2020-10-08 ENCOUNTER — Ambulatory Visit (INDEPENDENT_AMBULATORY_CARE_PROVIDER_SITE_OTHER): Payer: Medicare Other | Admitting: Psychology

## 2020-10-08 DIAGNOSIS — F4321 Adjustment disorder with depressed mood: Secondary | ICD-10-CM | POA: Diagnosis not present

## 2020-10-09 ENCOUNTER — Other Ambulatory Visit (INDEPENDENT_AMBULATORY_CARE_PROVIDER_SITE_OTHER): Payer: Medicare Other

## 2020-10-09 DIAGNOSIS — I959 Hypotension, unspecified: Secondary | ICD-10-CM | POA: Diagnosis not present

## 2020-10-09 DIAGNOSIS — R300 Dysuria: Secondary | ICD-10-CM

## 2020-10-09 LAB — COMPREHENSIVE METABOLIC PANEL
ALT: 14 U/L (ref 0–35)
AST: 17 U/L (ref 0–37)
Albumin: 3.8 g/dL (ref 3.5–5.2)
Alkaline Phosphatase: 103 U/L (ref 39–117)
BUN: 25 mg/dL — ABNORMAL HIGH (ref 6–23)
CO2: 25 mEq/L (ref 19–32)
Calcium: 9.7 mg/dL (ref 8.4–10.5)
Chloride: 103 mEq/L (ref 96–112)
Creatinine, Ser: 1.38 mg/dL — ABNORMAL HIGH (ref 0.40–1.20)
GFR: 38.72 mL/min — ABNORMAL LOW (ref >60.00)
Glucose, Bld: 88 mg/dL (ref 70–99)
Potassium: 3.9 mEq/L (ref 3.5–5.1)
Sodium: 137 mEq/L (ref 135–145)
Total Bilirubin: 0.2 mg/dL (ref 0.2–1.2)
Total Protein: 6.6 g/dL (ref 6.0–8.3)

## 2020-10-09 LAB — URINALYSIS, ROUTINE W REFLEX MICROSCOPIC
Bilirubin Urine: NEGATIVE
Hgb urine dipstick: NEGATIVE
Ketones, ur: NEGATIVE
Nitrite: NEGATIVE
Specific Gravity, Urine: 1.01 (ref 1.000–1.030)
Total Protein, Urine: NEGATIVE
Urine Glucose: NEGATIVE
Urobilinogen, UA: 0.2 (ref 0.0–1.0)
pH: 6 (ref 5.0–8.0)

## 2020-10-09 LAB — CBC WITH DIFFERENTIAL/PLATELET
Basophils Absolute: 0.1 10*3/uL (ref 0.0–0.1)
Basophils Relative: 2 % (ref 0.0–3.0)
Eosinophils Absolute: 0.3 10*3/uL (ref 0.0–0.7)
Eosinophils Relative: 3.8 % (ref 0.0–5.0)
HCT: 33.3 % — ABNORMAL LOW (ref 36.0–46.0)
Hemoglobin: 11.5 g/dL — ABNORMAL LOW (ref 12.0–15.0)
Lymphocytes Relative: 28.6 % (ref 12.0–46.0)
Lymphs Abs: 1.9 10*3/uL (ref 0.7–4.0)
MCHC: 34.7 g/dL (ref 30.0–36.0)
MCV: 90.7 fl (ref 78.0–100.0)
Monocytes Absolute: 0.5 10*3/uL (ref 0.1–1.0)
Monocytes Relative: 7.2 % (ref 3.0–12.0)
Neutro Abs: 3.9 10*3/uL (ref 1.4–7.7)
Neutrophils Relative %: 58.4 % (ref 43.0–77.0)
Platelets: 400 10*3/uL (ref 150.0–400.0)
RBC: 3.67 Mil/uL — ABNORMAL LOW (ref 3.87–5.11)
RDW: 13.2 % (ref 11.5–15.5)
WBC: 6.7 10*3/uL (ref 4.0–10.5)

## 2020-10-10 ENCOUNTER — Telehealth: Payer: Self-pay | Admitting: Internal Medicine

## 2020-10-10 NOTE — Telephone Encounter (Signed)
Patient said that she is having burning when urinating, frequent urination, headache and nausea. She said that she had labs yesterday. She was wondering [id number] [place]. Please advise

## 2020-10-11 ENCOUNTER — Other Ambulatory Visit: Payer: Self-pay | Admitting: Internal Medicine

## 2020-10-11 LAB — CULTURE, URINE COMPREHENSIVE
MICRO NUMBER:: 11938736
SPECIMEN QUALITY:: ADEQUATE

## 2020-10-11 LAB — IRON,TIBC AND FERRITIN PANEL
%SAT: 22 % (calc) (ref 16–45)
Ferritin: 64 ng/mL (ref 16–288)
Iron: 80 ug/dL (ref 45–160)
TIBC: 365 mcg/dL (calc) (ref 250–450)

## 2020-10-11 MED ORDER — CEFUROXIME AXETIL 250 MG PO TABS: 250.0000 mg | ORAL_TABLET | Freq: Two times a day (BID) | ORAL | 1 refills | Status: DC

## 2020-10-11 NOTE — Telephone Encounter (Signed)
A prescriptions for Ceftin emailed.  Thanks

## 2020-10-13 ENCOUNTER — Other Ambulatory Visit: Payer: Self-pay | Admitting: Internal Medicine

## 2020-10-14 NOTE — Telephone Encounter (Signed)
Pt aware she had UTI started antibiotic that was sent in already. See previous UA lab report

## 2020-10-15 ENCOUNTER — Ambulatory Visit: Payer: Medicare Other | Admitting: Psychology

## 2020-10-21 ENCOUNTER — Ambulatory Visit: Payer: Self-pay | Admitting: Thoracic Surgery (Cardiothoracic Vascular Surgery)

## 2020-10-22 ENCOUNTER — Ambulatory Visit: Payer: Medicare Other

## 2020-10-22 ENCOUNTER — Ambulatory Visit: Payer: Medicare Other | Admitting: Psychology

## 2020-10-22 NOTE — Progress Notes (Deleted)
Chronic Care Management Pharmacy Note  10/22/2020 Name:  [name] MRN:  [id number] DOB:  March 11, 1951  Subjective: [name] is an 70 y.o. year old female who is a primary patient of Plotnikov, [name], MD.  The CCM team was consulted for assistance with disease management and care coordination needs.    Engaged with patient face to face for initial visit in response to provider referral for pharmacy case management and/or care coordination services.   Consent to Services:  The patient was given the following information about Chronic Care Management services today, agreed to services, and gave verbal consent: 1. CCM service includes personalized support from designated clinical staff supervised by the primary care provider, including individualized plan of care and coordination with other care providers 2. 24/7 contact phone numbers for assistance for urgent and routine care needs. 3. Service will only be billed when office clinical staff spend 20 minutes or more in a month to coordinate care. 4. Only one practitioner may furnish and bill the service in a calendar month. 5.The patient may stop CCM services at any time (effective at the end of the month) by phone call to the office staff. 6. The patient will be responsible for cost sharing (co-pay) of up to 20% of the service fee (after annual deductible is met). Patient agreed to services and consent obtained.  Patient Care Team: Plotnikov, [name], MD as PCP - General (Internal Medicine) Oren Binet, PhD as Consulting Physician (Psychology) Norma Fredrickson, MD as Consulting Physician (Psychiatry) Calvert Cantor, MD as Consulting Physician (Ophthalmology) Neldon Mc, Donnamarie Poag, MD as Consulting Physician (Allergy and Immunology) Alexis Frock, MD as Consulting Physician (Urology) Lafonda Mosses, MD as Consulting Physician (Gynecologic Oncology) Gery Pray, MD as Consulting Physician (Radiation  Oncology) Mauri Pole, MD as Consulting Physician (Gastroenterology) Rosita Fire, MD as Consulting Physician (Nephrology) Melrose Nakayama, MD as Consulting Physician (Cardiothoracic Surgery) Sherlynn Stalls, MD as Consulting Physician (Ophthalmology) Delice Bison Darnelle Maffucci, [facility] (Pharmacist) Tomasa Blase, [facility] as Pharmacist (Pharmacist)  Recent office visits: 09/10/2020 - PCP visit following recent lung surgery - lisinopril d/cd due to low blood pressures  07/04/2020 - Jodi Mourning - televideo visit - UTI - given course of ciprofloxacin  04/14/2020 - PCP - IBS-D  - trial of colesevelam - amlodipine and coreg stopped due to fatigue / low BP   Recent consult visits: 08/22/2020 - Dr. 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 Bettina Gavia - Cardiology - no changes to current medications   Hospital visits: 07/17/2020 - Dr. Roxan Hockey - robotic right upper lobe wedge resection  06/17/2020 - Dr. Roxan Hockey - bronchoscopy   Objective:  Lab Results  Component Value Date   CREATININE 1.38 (H) 10/09/2020   BUN 25 (H) 10/09/2020   GFR 38.72 (L) 10/09/2020   GFRNONAA 43 (L) 07/19/2020   GFRAA 50 (L) 11/26/2019   NA 137 10/09/2020   K 3.9 10/09/2020   CALCIUM 9.7 10/09/2020   CO2 25 10/09/2020   GLUCOSE 88 10/09/2020    Lab Results  Component Value Date/Time   HGBA1C 5.5 06/27/2017 01:15 PM   HGBA1C 5.5 09/23/2008 04:50 PM   GFR 38.72 (L) 10/09/2020 03:43 PM   GFR 36.00 (L) 05/05/2020 03:31 PM    Last diabetic Eye exam:  No results found for: HMDIABEYEEXA  Last  diabetic Foot exam:  No results found for: HMDIABFOOTEX   Lab Results  Component Value Date   CHOL 263 (H) 10/18/2018   HDL 45.40 10/18/2018   LDLCALC UNABLE TO CALCULATE IF TRIGLYCERIDE OVER 400 mg/dL 08/26/2017   LDLDIRECT 160.0  10/18/2018   TRIG 271.0 (H) 10/18/2018   CHOLHDL 6 10/18/2018    Hepatic Function Latest Ref Rng & Units 10/09/2020 07/19/2020 07/15/2020  Total Protein 6.0 - 8.3 g/dL 6.6 5.4(L) 6.6  Albumin 3.5 - 5.2 g/dL 3.8 3.0(L) 3.7  AST 0 - 37 U/L 17 48(H) 30  ALT 0 - 35 U/L 14 56(H) 27  Alk Phosphatase 39 - 117 U/L 103 68 98  Total Bilirubin 0.2 - 1.2 mg/dL 0.2 0.6 0.8  Bilirubin, Direct 0.0 - 0.3 mg/dL - - -    Lab Results  Component Value Date/Time   TSH 1.26 04/14/2020 02:46 PM   TSH 1.20 08/27/2019 05:08 PM   FREET4 1.08 10/19/2018 04:28 PM   FREET4 1.05 10/15/2016 11:59 AM    CBC Latest Ref Rng & Units 10/09/2020 08/25/2020 07/19/2020  WBC 4.0 - 10.5 K/uL 6.7 7.1 8.9  Hemoglobin 12.0 - 15.0 g/dL 11.5(L) 11.6(L) 8.9(L)  Hematocrit 36.0 - 46.0 % 33.3(L) 35.0(L) 26.6(L)  Platelets 150.0 - 400.0 K/uL 400.0 376 226    Lab Results  Component Value Date/Time   VD25OH 53.01 06/27/2017 01:15 PM   VD25OH 69.02 12/29/2016 08:50 AM    Clinical ASCVD: {YES/NO:21197} The 10-year ASCVD risk score Mikey Bussing DC Jr., et al., 2013) is: 9.5%   Values used to calculate the score:     Age: 74 years     Sex: Female     Is Non-Hispanic African American: No     Diabetic: No     Tobacco smoker: No     Systolic Blood Pressure: 825 mmHg     Is BP treated: Yes     HDL Cholesterol: 45.4 mg/dL     Total Cholesterol: 263 mg/dL    Depression screen [facility] 2/9 12/06/2019  Decreased Interest 0  Down, Depressed, Hopeless 0  PHQ - 2 Score 0  Altered sleeping 0  Tired, decreased energy 0  Change in appetite 0  Feeling bad or failure about yourself  0  Trouble concentrating 0  Moving slowly or fidgety/restless 0  Suicidal thoughts 0  PHQ-9 Score 0  Difficult doing work/chores Not difficult at all  Some recent data might be hidden     Social History   Tobacco Use  Smoking Status Never Smoker  Smokeless Tobacco Never Used   BP Readings from Last 3 Encounters:  09/10/20 104/82  08/22/20 121/82  08/05/20  103/62   Pulse Readings from Last 3 Encounters:  09/10/20 82  08/22/20 (!) 103  08/05/20 92   Wt Readings from Last 3 Encounters:  09/10/20 158 lb 6.4 oz (71.8 kg)  08/22/20 163 lb 12.8 oz (74.3 kg)  08/05/20 158 lb (71.7 kg)   BMI Readings from Last 3 Encounters:  09/10/20 28.97 kg/m  08/22/20 29.96 kg/m  08/05/20 28.90 kg/m    Assessment/Interventions: Review of patient past medical history, allergies, medications, health status, including review of consultants reports, laboratory and other test data, was performed as part of comprehensive evaluation and provision of chronic care management services.   SDOH:  (Social Determinants of Health) assessments and interventions performed: {yes/no:20286}  SDOH Screenings   Alcohol Screen: Not on file  Depression (PHQ2-9): Low Risk   . PHQ-2 Score: 0  Financial Resource  Strain: Not on file  Food Insecurity: Not on file  Housing: Not on file  Physical Activity: Not on file  Social Connections: Not on file  Stress: Not on file  Tobacco Use: Low Risk   . Smoking Tobacco Use: Never Smoker  . Smokeless Tobacco Use: Never Used  Transportation Needs: Not on file    CCM Care Plan  Allergies  Allergen Reactions  . Buprenorphine Hcl Other (See Comments)    Severe headache, confusion  . Morphine And Related Other (See Comments)    Severe headache  . Buprenorphine Other (See Comments)  . Cephalexin Nausea And Vomiting and Nausea Only    Other reaction(s): Vomiting  . Other Other (See Comments)    Trees & Grass = allergic symptoms  . Codeine Other (See Comments)    Disorientation  . Dust Mite Extract Cough  . Molds & Smuts Cough    Medications Reviewed Today    Reviewed by Cassandria Anger, MD (Physician) on 09/10/20 at [place] List Status: <None>  Medication Order Taking? Sig Documenting Provider Last Dose Status Informant  acetaminophen (TYLENOL) 325 MG tablet [id number] Yes Take 650 mg by mouth every 6 (six) hours  as needed for moderate pain.  [provider] Taking Active Self  ALPRAZolam Duanne Moron) 0.5 MG tablet [id number] Yes Take 1 tablet (0.5 mg total) by mouth 2 (two) times daily as needed for anxiety. Plotnikov, [name], MD Taking Active   aspirin EC 81 MG tablet [id number] Yes Take 81 mg by mouth daily. [provider] Taking Active Self  busPIRone (BUSPAR) 30 MG tablet [id number] Yes Take 30 mg by mouth 2 (two) times daily. [provider] Taking Active Self  colesevelam (WELCHOL) 625 MG tablet [id number] Yes Take 2 tablets (1,250 mg total) by mouth 2 (two) times daily with a meal. Plotnikov, [name], MD Taking Active Self  D-Mannose 500 MG CAPS [id number] Yes Take 500 mg by mouth 2 (two) times daily. WITH CRANBERRY & DANDELION EXTRACT [provider] Taking Active Self  esomeprazole (NEXIUM) 40 MG capsule [id number] Yes TAKE 1 CAPSULE BY MOUTH EVERY MORNING  Patient taking differently: Take 40 mg by mouth daily.   Kozlow, Donnamarie Poag, MD Taking Active   estradiol (ESTRACE) 1 MG tablet [id number] Yes Take 1 tablet (1 mg total) by mouth daily. Niel Hummer, NP Taking Active Self       Patient not taking:      Discontinued 11/05/19 0627   FLUoxetine (PROZAC) 40 MG capsule [id number] Yes Take 1 capsule (40 mg total) by mouth every morning. Plotnikov, [name], MD Taking Active Self  furosemide (LASIX) 20 MG tablet [id number] Yes TAKE 1-2 TABLETS (20-40 MG TOTAL) BY MOUTH DAILY. Plotnikov, [name], MD Taking Active   ipratropium-albuterol (DUONEB) 0.5-2.5 (3) MG/3ML SOLN [id number] Yes Take 3 mLs by nebulization every 6 (six) hours as needed (Asthma). [provider] Taking Active Self  iron polysaccharides (NIFEREX) 150 MG capsule [id number] Yes TAKE 1 CAPSULE BY MOUTH TWICE A DAY  Patient taking differently: Take 150 mg by mouth 2 (two) times daily.   Plotnikov, [name], MD Taking Active   levothyroxine (SYNTHROID) 100 MCG tablet [id number] Yes TAKE 1 TABLET BY  MOUTH EVERY DAY BEFORE BREAKFAST  Patient taking differently: Take 100 mcg by mouth daily before breakfast.   Plotnikov, [name], MD Taking Active   mirtazapine (REMERON) 30 MG tablet [id number] Yes Take 1 tablet (30 mg total) by mouth at bedtime. Plotnikov, Tyrone Apple  V, MD Taking Active Self  Multiple Vitamins-Minerals (MULTIVITAMIN WITH MINERALS) tablet [id number] Yes Take 1 tablet by mouth daily. Niel Hummer, NP Taking Active Self  Omega-3 Fatty Acids (FISH OIL) 1200 MG CAPS [id number] Yes Take 1,200 mg by mouth daily.  [provider] Taking Active Self  ondansetron (ZOFRAN) 4 MG tablet [id number] Yes Take 1 tablet (4 mg total) by mouth every 8 (eight) hours as needed for nausea or vomiting. Gery Pray, MD Taking Active   Polyethyl Glycol-Propyl Glycol [facility] OP) [id number] Yes Place 1 drop into both eyes 2 (two) times daily as needed (dry eyes).  [provider] Taking Active Self  progesterone (PROMETRIUM) 100 MG capsule [id number] Yes Take 100 mg by mouth at bedtime. [provider] Taking Active Self  SUMAtriptan (IMITREX) 100 MG tablet [id number] Yes Take 0.5 tablets (50 mg total) by mouth every 2 (two) hours as needed for migraine. Antony Odea, PA-C Taking Active   triamterene-hydrochlorothiazide (MAXZIDE-25) 37.5-25 MG tablet [id number] Yes Take 1 tablet by mouth daily. [provider] Taking Active Self          Patient Active Problem List   Diagnosis Date Noted  . Low BP 09/10/2020  . Physical deconditioning 09/10/2020  . Osteopenia 03/24/2020  . Genital herpes simplex 03/24/2020  . Chronic fatigue syndrome 03/24/2020  . Bipolar I disorder ([place]) 03/24/2020  . Wears glasses   . Urgency of urination   . Tuberculosis   . Status post chemotherapy   . S/P dilatation of esophageal stricture   . Renal calculus, bilateral   . Positive reaction to tuberculin skin test   . Pneumonia   . Mild persistent asthma   . IDA (iron deficiency  anemia)   . Hx of transfusion of packed red blood cells   . History of syncope   . History of positive PPD   . History of kidney stones   . History of DVT (deep vein thrombosis)   . History of concussion   . GERD (gastroesophageal reflux disease)   . GAD (generalized anxiety disorder)   . CKD (chronic kidney disease), stage III ([place])   . Chronically dry eyes   . Chronic cystitis   . Chronic bronchitis ([place])   . Arthritis   . 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 Pelvic mass in female 09/20/2019  . Ureteral stone with hydronephrosis 09/04/2019  . Edema 08/27/2019  . Bladder pain 08/2019  . Dysuria 05/17/2019  . Suspected 2019 novel coronavirus infection 04/25/2019  . Urinary urgency 03/06/2019  . Diarrhea 03/06/2019  . 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 Dyspnea on exertion 08/25/2017  . Chest pain in adult 07/13/2017  . HTN (hypertension) 05/31/2017  . Anemia 05/31/2017  . Leg pain, anterior, left 12/15/2016  . Paresthesia 12/15/2016  . Syncope 10/13/2016  . Migraine headache 10/13/2016  . Asthma 10/13/2016  . Anxiety 10/13/2016  . Hyperlipidemia 10/13/2016  . UTI (urinary tract infection) 10/13/2016  . Hypothyroidism 10/13/2016  . Laryngopharyngeal reflux (LPR) 06/15/2016  . Acute upper respiratory infection 05/05/2016  . Degenerative disc disease, cervical 11/29/2015  . IBS (irritable bowel syndrome) 11/24/2015  . Facet arthritis of cervical region 10/16/2015  . Chronic urinary tract infection 07/22/2015  . Malaise and fatigue 04/16/2015  . Severe episode of recurrent major depressive disorder, without psychotic features ([place]) 12/18/2014  . Post concussion syndrome 10/28/2014  . 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 Depression 10/25/2014  . Major depressive disorder, recurrent, severe without psychotic features ([place])   .  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 Concussion w/o coma 09/18/2014  . Irritable bowel syndrome with diarrhea  09/12/2014  . 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 Cold intolerance 03/26/2014  . Cough variant asthma  vs UACS/ irritable larynx  03/26/2014  . Abdominal pain 03/26/2014  . Positive skin test 03/26/2014  . Adjustment disorder with anxiety 02/07/2014  . Cervical pain 12/05/2013  . Fibromyalgia 12/05/2013  . Cephalalgia 12/05/2013  . Adult hypothyroidism 12/05/2013  . Adaptive colitis 12/05/2013  . Menopause 12/05/2013  . Mild cognitive disorder 12/05/2013  . Feeling bilious 12/05/2013  . Leiomyosarcoma ([place]) 09/10/2013  . Lung mass 09/10/2013  . Lung nodule, solitary 09/10/2013  . Abnormal chest x-ray   . Unspecified hypothyroidism 04/25/2013  . Vitamin D deficiency 04/25/2013  . 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 Female genuine stress incontinence 04/12/2012  . Healthcare maintenance 10/30/2010  . MUSCLE PAIN 07/03/2008  . BIPLR I MOST RECENT EPIS DPRS SEV NO PSYCHOT B[facility] 02/14/2008  . DEEP PHLEBOTHROMBOSIS PP UNSPEC AS EPIS CARE 02/14/2008  . Bipolar affective disorder, current episode depressed ([place]) 02/14/2008  . Deep phlebothrombosis, postpartum 02/14/2008  . HYPERLIPIDEMIA 03/04/2007  . ALLERGIC RHINITIS 03/04/2007  . GERD 03/04/2007  . IRRITABLE BOWEL SYNDROME, HX OF 03/04/2007    Immunization History  Administered Date(s) Administered  . Fluad Quad(high Dose 65+) 04/14/2020  . H1N1 06/14/2008  . Influenza Whole 02/14/2008, 02/17/2009, 03/17/2013  . Influenza, High Dose Seasonal PF 04/02/2015, 05/31/2017, 03/16/2018, 12/27/2018  . Influenza-Unspecified 03/24/2016  . MMR 06/19/2009  . PFIZER(Purple Top)SARS-COV-2 Vaccination 06/05/2019, 06/23/2019, 02/14/2020  . Pneumococcal Conjugate-13 05/05/2016  . Pneumococcal Polysaccharide-23 05/31/2017  . Td 05/17/2000, 06/19/2009  . Zoster, Live 04/16/2014    Conditions to be addressed/monitored:  {USCCMDZASSESSMENTOPTIONS:23563}  There are no care  plans that you recently modified to display for this patient.     Medication Assistance: {MEDASSISTANCEINFO:25044}  Patient's preferred pharmacy is:  [id number] [address] [address]Phone: [phone]: [phone]  Uses pill box? {Yes or If no, why not?:20788} Pt endorses ***% compliance  Care Plan and Follow Up Patient Decision:  {FOLLOWUP:24991}  Plan: {CM FOLLOW UP PLAN:25073}  ***  Current Barriers:  . {pharmacybarriers:24917}  Pharmacist Clinical Goal(s):  .Marland KitchenPatient will {PHARMACYGOALCHOICES:24921} through collaboration with PharmD and provider.   Interventions: . 1:1 collaboration with Plotnikov, AEvie Lacks MD regarding development and update of comprehensive plan of care as evidenced by provider attestation and co-signature . Inter-disciplinary care team collaboration (see longitudinal plan of care) . Comprehensive medication review performed; medication list updated in electronic medical record  Hypertension (BP goal <140/90) -{US controlled/uncontrolled:25276} -Current treatment: . Triamterene-HCTZ - 37.5-25mg daily  . Furosemide 237m- 1-2 tablets daily  -Medications previously tried: amlodipine, coreg, lisinopril  -Current home readings: *** -Current dietary habits: *** -Current exercise habits: *** -{ACTIONS;DENIES/REPORTS:21021675::"Denies"} hypotensive/hypertensive symptoms -Educated on {CCM BP Counseling:25124} -Counseled to monitor BP at home ***, document, and provide log at future appointments -{CCMPHARMDINTERVENTION:25122}  Hyperlipidemia: (LDL goal < 100) -{US controlled/uncontrolled:25276} -Current treatment: . Omega-3 Fish Oils - 120069maily  . ASA 37m61mily  -Medications previously tried: ***  -Current dietary patterns: *** -Current exercise habits: *** -Educated on {CCM HLD Counseling:25126} -{CCMPHARMDINTERVENTION:25122}  Depression/Anxiety (Goal: Mood control /  prevention of anxiety attacks) -{US controlled/uncontrolled:25276} -Current treatment: . Alprazolam 0.5mg 37mce daily as needed  . Buspirone 30mg 56me daily  . Fluoxetine 40mg d58m .  Mirtazepine 15m at bedtime  -Medications previously tried/failed: *** -PHQ9: *** -GAD7: *** -Connected with *** for mental health support -Educated on {CCM mental health counseling:25127} -{CCMPHARMDINTERVENTION:25122}  Hypothyroidism (Goal: Maintenance of euthyroid levels) -{US controlled/uncontrolled:25276} -Current treatment  . Levothyroxine 1040m daily  -Medications previously tried: ***  -{CCMPHARMDINTERVENTION:25122}   IBS-D /GERD (Goal: Prevention of diarrhea / control of acid reflux) -{US controlled/uncontrolled:25276} -Current treatment  . Esomeprazole 4077maily  . Colesevelam 625m58m2 tablets twice daily with meals  . Ondansetron 4mg 36mvery 8 hours as needed for nausea or vomiting  -Medications previously tried: Famotidine   -{CCMPHARMDINTERVENTION:25122}    Health Maintenance -Vaccine gaps: *** -Current therapy:  . Acetaminophen 325mg 23mtablets every 6 hours as needed for pain  . D-Mannose 500mg -19mce daily - with cranberry and dandelion extract  . Ferrex 150mg tw59mdaily  . Multivitamin - once daily  . Systane Eye drops - 1 drop into both eyes twice daily as needed for dry eyes  -Educated on {ccm supplement counseling:25128} -{CCM Patient satisfied:25129} -{CCMPHARMDINTERVENTION:25122}   Patient Goals/Self-Care Activities . Patient will:  - {pharmacypatientgoals:24919}  Follow Up Plan: {CM FOLLOW UP PLAN:22241}   Current Chart prep time = 25 mins

## 2020-10-28 ENCOUNTER — Other Ambulatory Visit: Payer: Self-pay

## 2020-10-28 ENCOUNTER — Ambulatory Visit: Payer: Medicare Other | Admitting: Thoracic Surgery (Cardiothoracic Vascular Surgery)

## 2020-10-28 ENCOUNTER — Ambulatory Visit
Admission: RE | Admit: 2020-10-28 | Discharge: 2020-10-28 | Disposition: A | Discharge status: Home / Self Care | Payer: Medicare Other | Source: Ambulatory Visit | Attending: Thoracic Surgery (Cardiothoracic Vascular Surgery) | Admitting: Thoracic Surgery (Cardiothoracic Vascular Surgery)

## 2020-10-28 VITALS — BP 114/69 | HR 79 | Resp 20 | Ht 62.0 in | Wt 160.0 lb

## 2020-10-28 DIAGNOSIS — R918 Other nonspecific abnormal finding of lung field: Secondary | ICD-10-CM

## 2020-10-28 DIAGNOSIS — Z9889 Other specified postprocedural states: Secondary | ICD-10-CM | POA: Diagnosis not present
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
## 2020-10-29 ENCOUNTER — Ambulatory Visit (INDEPENDENT_AMBULATORY_CARE_PROVIDER_SITE_OTHER): Payer: Medicare Other | Admitting: Psychology

## 2020-10-29 DIAGNOSIS — F4321 Adjustment disorder with depressed mood: Secondary | ICD-10-CM

## 2020-10-30 ENCOUNTER — Ambulatory Visit: Payer: Medicare Other

## 2020-11-04 DIAGNOSIS — H35342 Macular cyst, hole, or pseudohole, left eye: Secondary | ICD-10-CM | POA: Diagnosis not present

## 2020-11-04 DIAGNOSIS — N200 Calculus of kidney: Secondary | ICD-10-CM | POA: Diagnosis not present

## 2020-11-04 DIAGNOSIS — H35371 Puckering of macula, right eye: Secondary | ICD-10-CM | POA: Diagnosis not present

## 2020-11-04 DIAGNOSIS — I129 Hypertensive chronic kidney disease with stage 1 through stage 4 chronic kidney disease, or unspecified chronic kidney disease: Secondary | ICD-10-CM | POA: Diagnosis not present

## 2020-11-04 DIAGNOSIS — N2581 Secondary hyperparathyroidism of renal origin: Secondary | ICD-10-CM | POA: Diagnosis not present

## 2020-11-04 DIAGNOSIS — N1832 Chronic kidney disease, stage 3b: Secondary | ICD-10-CM | POA: Diagnosis not present

## 2020-11-05 ENCOUNTER — Ambulatory Visit: Payer: Medicare Other | Admitting: Psychology

## 2020-11-12 ENCOUNTER — Ambulatory Visit (INDEPENDENT_AMBULATORY_CARE_PROVIDER_SITE_OTHER): Payer: Medicare Other | Admitting: Psychology

## 2020-11-12 DIAGNOSIS — F4321 Adjustment disorder with depressed mood: Secondary | ICD-10-CM | POA: Diagnosis not present

## 2020-11-20 ENCOUNTER — Telehealth: Payer: Self-pay | Admitting: Internal Medicine

## 2020-11-20 NOTE — Telephone Encounter (Signed)
Notified pt she can go t the lab to have UA done.. There is order that was already in system for future.Marland KitchenJohny Grant

## 2020-11-20 NOTE — Telephone Encounter (Signed)
   Patient calling to report painful, frequent urination, bladder pressure  Can patient use urine test order already in Epic, or should she be scheduled for appointment.

## 2020-11-25 DIAGNOSIS — N3020 Other chronic cystitis without hematuria: Secondary | ICD-10-CM | POA: Diagnosis not present

## 2020-11-25 DIAGNOSIS — N202 Calculus of kidney with calculus of ureter: Secondary | ICD-10-CM | POA: Diagnosis not present

## 2020-11-26 ENCOUNTER — Ambulatory Visit (INDEPENDENT_AMBULATORY_CARE_PROVIDER_SITE_OTHER): Payer: Medicare Other | Admitting: Psychology

## 2020-11-26 DIAGNOSIS — F4321 Adjustment disorder with depressed mood: Secondary | ICD-10-CM

## 2020-11-28 ENCOUNTER — Telehealth: Payer: Self-pay | Admitting: Internal Medicine

## 2020-11-28 NOTE — Telephone Encounter (Signed)
   Patient calling for status of order for repeat CT scan  Patient states Dr Alain Marion was to order  Please advise

## 2020-12-01 NOTE — Telephone Encounter (Signed)
Please remind me what scan I am supposed to order. Thanks

## 2020-12-02 NOTE — Telephone Encounter (Signed)
Called pt she states it was for CT chest abdomen and pelvis. She states it was for when she had cancer in her bladder and it went to her lungs. The first one was ordered by Jeral Pinch, and was told to have recheck in 6 months.Marland KitchenJohny Grant

## 2020-12-04 ENCOUNTER — Ambulatory Visit (INDEPENDENT_AMBULATORY_CARE_PROVIDER_SITE_OTHER): Payer: Medicare Other | Admitting: Psychology

## 2020-12-04 DIAGNOSIS — F4321 Adjustment disorder with depressed mood: Secondary | ICD-10-CM | POA: Diagnosis not present

## 2020-12-04 NOTE — Telephone Encounter (Signed)
I see in the chart that Dr. Berline Lopes ordered a follow-up abdominal/pelvis CT in April. Thanks

## 2020-12-05 NOTE — Telephone Encounter (Signed)
Upon review of the chart, Dr Berline Lopes order CT on 08/22/20 to be done around 12/30/20.  Pt notified of this & verb understanding.

## 2020-12-09 ENCOUNTER — Other Ambulatory Visit: Payer: Self-pay

## 2020-12-09 ENCOUNTER — Ambulatory Visit (INDEPENDENT_AMBULATORY_CARE_PROVIDER_SITE_OTHER): Payer: Medicare Other

## 2020-12-09 DIAGNOSIS — F32A Depression, unspecified: Secondary | ICD-10-CM | POA: Diagnosis not present

## 2020-12-09 DIAGNOSIS — E039 Hypothyroidism, unspecified: Secondary | ICD-10-CM | POA: Diagnosis not present

## 2020-12-09 DIAGNOSIS — I10 Essential (primary) hypertension: Secondary | ICD-10-CM

## 2020-12-09 DIAGNOSIS — N183 Chronic kidney disease, stage 3 unspecified: Secondary | ICD-10-CM

## 2020-12-09 DIAGNOSIS — G43019 Migraine without aura, intractable, without status migrainosus: Secondary | ICD-10-CM

## 2020-12-09 DIAGNOSIS — K580 Irritable bowel syndrome with diarrhea: Secondary | ICD-10-CM

## 2020-12-09 DIAGNOSIS — N390 Urinary tract infection, site not specified: Secondary | ICD-10-CM

## 2020-12-09 NOTE — Progress Notes (Addendum)
Chronic Care Management Pharmacy Note  12/10/2020 Name:  [name] MRN:  [id number] DOB:  [birth year]  Summary: - Patient reports that she is continuing the rehab from latest surgery but feels that she is doing well and is pleased with he progress.  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 year old female who is a primary patient of Plotnikov, [name], MD.  The CCM team was consulted for assistance with disease management and care coordination needs.    Engaged with patient face to face for initial visit in response to provider referral for pharmacy case management and/or care coordination services.   Consent to Services:  The patient was given the following information about Chronic Care Management services today, agreed to services, and gave verbal consent: 1. CCM service includes personalized support from designated clinical staff supervised by the primary care provider, including individualized plan of care and coordination with other care providers 2. 24/7 contact phone numbers for assistance for urgent and routine care needs. 3. Service will only be billed when office clinical staff spend 20 minutes or more in a month to coordinate  care. 4. Only one practitioner may furnish and bill the service in a calendar month. 5.The patient may stop CCM services at any time (effective at the end of the month) by phone call to the office staff. 6. The patient will be responsible for cost sharing (co-pay) of up to 20% of the service fee (after annual deductible is met). Patient agreed to services and consent obtained.  Patient Care Team: Plotnikov, [name], MD as PCP - General (Internal Medicine) Oren Binet, PhD as Consulting Physician (Psychology) Norma Fredrickson, MD as Consulting Physician (Psychiatry) Calvert Cantor, MD as Consulting Physician (Ophthalmology) Neldon Mc, Donnamarie Poag, MD as Consulting Physician (Allergy and Immunology) Alexis Frock, MD as Consulting Physician (Urology) Lafonda Mosses, MD as Consulting Physician (Gynecologic Oncology) Gery Pray, MD as Consulting Physician (Radiation Oncology) Mauri Pole, MD as Consulting Physician (Gastroenterology) Rosita Fire, MD as Consulting Physician (Nephrology) Melrose Nakayama, MD as Consulting Physician (Cardiothoracic Surgery) Sherlynn Stalls, MD as Consulting Physician (Ophthalmology) Delice Bison Darnelle Maffucci, [facility] (Pharmacist) Tomasa Blase, [facility] as Pharmacist (Pharmacist)  Recent office visits: 09/10/2020 - PCP visit - follow up after recent lung surgery - CC fatigue and SOB - recommended to start PT when able to, hold maxide, stop lisinopril, and use lasix as needed  07/04/2020 - Jodi Mourning FNP - UTI - prescribed Cipro for 10 days   Recent consult visits: 10/28/2020 - Dr. Roxan Hockey - Cardiothoracic Surgery - follow up after previous right upper lobe resection - healing as expected, to follow up with Dr. Berline Lopes and PCP  08/22/2020 - Dr. 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  Roxan Hockey - Cardiothoracic Surgery - dicussed results of bronchoscopy - plan to tumor marking follow by robotic right VATS for wedge resection and possible upper lobectomy   Hospital visits: 07/17/2020-07/19/2020 - admitted for resection of nodule of upper lobe of right long  06/16/2020 - video bronchoscopy   Objective:  Lab Results  Component Value Date   CREATININE 1.38 (H) 10/09/2020   BUN 25 (H) 10/09/2020   GFR 38.72 (L) 10/09/2020   GFRNONAA 43 (L) 07/19/2020   GFRAA 50 (L) 11/26/2019   NA 137 10/09/2020   K 3.9 10/09/2020   CALCIUM 9.7 10/09/2020   CO2 25 10/09/2020   GLUCOSE 88 10/09/2020    Lab Results  Component Value Date/Time   HGBA1C 5.5 06/27/2017 01:15 PM   HGBA1C 5.5 09/23/2008 04:50 PM   GFR 38.72 (L) 10/09/2020 03:43 PM   GFR 36.00 (L) 05/05/2020 03:31 PM    Last diabetic Eye exam:  No results found for: HMDIABEYEEXA  Last diabetic Foot exam:  No results found for: HMDIABFOOTEX   Lab Results  Component Value Date   CHOL 263 (H) 10/18/2018   HDL 45.40 10/18/2018   LDLCALC UNABLE TO CALCULATE IF TRIGLYCERIDE OVER 400 mg/dL 08/26/2017   LDLDIRECT 160.0 10/18/2018   TRIG 271.0 (H) 10/18/2018   CHOLHDL 6 10/18/2018    Hepatic Function Latest Ref Rng & Units 10/09/2020 07/19/2020 07/15/2020  Total Protein 6.0 - 8.3 g/dL 6.6 5.4(L) 6.6  Albumin 3.5 - 5.2 g/dL 3.8 3.0(L) 3.7  AST 0 - 37 U/L 17 48(H) 30  ALT 0 - 35 U/L 14 56(H) 27  Alk Phosphatase 39 - 117 U/L 103 68 98  Total Bilirubin 0.2 - 1.2 mg/dL 0.2 0.6 0.8  Bilirubin, Direct 0.0 - 0.3 mg/dL - - -    Lab Results  Component Value Date/Time   TSH 1.26 04/14/2020 02:46 PM   TSH 1.20 08/27/2019 05:08 PM   FREET4 1.08 10/19/2018 04:28 PM   FREET4 1.05 10/15/2016 11:59 AM    CBC Latest Ref Rng & Units 10/09/2020 08/25/2020 07/19/2020  WBC 4.0 - 10.5 K/uL 6.7 7.1 8.9  Hemoglobin 12.0 - 15.0 g/dL 11.5(L) 11.6(L) 8.9(L)  Hematocrit 36.0 - 46.0 % 33.3(L) 35.0(L) 26.6(L)  Platelets 150.0 - 400.0 K/uL 400.0  376 226    Lab Results  Component Value Date/Time   VD25OH 53.01 06/27/2017 01:15 PM   VD25OH 69.02 12/29/2016 08:50 AM    Clinical ASCVD: No  The 10-year ASCVD risk score Mikey Bussing DC Jr., et al., 2013) is: 11.4%   Values used to calculate the score:     Age: 38 years     Sex: Female     Is Non-Hispanic African American: No     Diabetic: No     Tobacco smoker: No     Systolic Blood Pressure: 940 mmHg     Is BP treated: Yes     HDL Cholesterol: 45.4 mg/dL     Total Cholesterol: 263 mg/dL    Depression screen [facility] 2/9 12/09/2020 12/06/2019  Decreased Interest 1 0  Down, Depressed, Hopeless 0 0  PHQ - 2 Score 1 0  Altered sleeping 0 0  Tired, decreased energy 3 0  Change in appetite 0 0  Feeling bad or failure about yourself  1 0  Trouble concentrating 0 0  Moving slowly or fidgety/restless 0 0  Suicidal thoughts 0 0  PHQ-9 Score 5 0  Difficult doing work/chores Not difficult at all Not difficult at all  Some recent data might be hidden    Social History   Tobacco Use  Smoking Status Never  Smokeless Tobacco Never   BP Readings from Last 3 Encounters:  10/28/20 114/69  09/10/20 104/82  08/22/20 121/82   Pulse Readings from Last 3 Encounters:  10/28/20 79  09/10/20 82  08/22/20 (!) 103   Wt Readings from Last 3 Encounters:  10/28/20 160 lb (72.6 kg)  09/10/20 158 lb 6.4 oz (71.8 kg)  08/22/20 163 lb 12.8 oz (74.3 kg)   BMI Readings from Last 3 Encounters:  10/28/20 29.26 kg/m  09/10/20 28.97 kg/m  08/22/20 29.96 kg/m    Assessment/Interventions: Review of patient past medical history, allergies, medications, health status, including review of consultants reports, laboratory and other test data, was performed as part of comprehensive evaluation and provision of chronic care management services.   SDOH:  (Social Determinants of Health) assessments and interventions performed: Yes SDOH Interventions    Flowsheet Row Most Recent Value  SDOH Interventions    Depression Interventions/Treatment  Counseling, Referral to Psychiatry, Medication, Currently on Treatment      SDOH Screenings   Alcohol Screen: Not on file  Depression (PHQ2-9): Medium Risk   PHQ-2 Score: 5  Financial Resource Strain: Not on file  Food Insecurity: Not on file  Housing: Not on file  Physical Activity: Not on file  Social Connections: Not on file  Stress: Not on file  Tobacco Use: Low Risk    Smoking Tobacco Use: Never   Smokeless Tobacco Use: Never  Transportation Needs: Not on file    [place]  Allergies  Allergen Reactions   Buprenorphine Hcl Other (See Comments)    Severe headache, confusion   Morphine And Related Other (See Comments)    Severe headache   Buprenorphine Other (See Comments)   Cephalexin Nausea And Vomiting and Nausea Only    Other reaction(s): Vomiting   Other Other (See Comments)    Trees & Grass = allergic symptoms   Codeine Other (See Comments)    Disorientation   Dust Mite Extract Cough   Molds & Smuts Cough    Medications Reviewed Today     Reviewed by Tomasa Blase, [facility] (Pharmacist) on 12/09/20 at 1205  Med List Status: <None>   Medication Order Taking? Sig Documenting Provider Last Dose Status Informant  acetaminophen (TYLENOL) 325 MG tablet [id number] Yes Take 650 mg by mouth every 6 (six) hours as needed for moderate pain.  [provider] Taking Active Self  ALPRAZolam Duanne Moron) 0.5 MG tablet [id number] Yes Take 1 tablet (0.5 mg total) by mouth 2 (two) times daily as needed for anxiety.  Patient taking differently: Take 0.5 mg by mouth at bedtime as needed for anxiety.   Plotnikov, [name], MD Taking Active   aspirin EC 81 MG tablet [id number] Yes Take 81 mg by mouth daily. [provider] Taking Active Self  Bromelains (BROMELAIN PO) [id number] Yes Take 750 mg by mouth daily. [provider] Taking Active   busPIRone (BUSPAR) 30 MG tablet [id number] Yes Take 30 mg by mouth 2 (two) times  daily. [provider] Taking Active Self  Cholecalciferol (VITAMIN D3) 125 MCG (5000 UT) CAPS [id number] Yes Take 1 capsule by mouth daily. [provider] Taking Active   colesevelam [facility]) 625 MG tablet [id number] Yes Take 2 tablets (1,250 mg total)  by mouth 2 (two) times daily with a meal. Plotnikov, [name], MD Taking Active Self  D-Mannose 500 MG CAPS [id number] Yes Take 500 mg by mouth 2 (two) times daily. WITH CRANBERRY & DANDELION EXTRACT [provider] Taking Active Self  estradiol (ESTRACE) 1 MG tablet [id number] Yes Take 1 tablet (1 mg total) by mouth daily. Niel Hummer, NP Taking Active Self   Patient not taking:   Discontinued 11/05/19 0627 FERREX 150 150 MG capsule [id number] Yes TAKE 1 CAPSULE BY MOUTH TWICE A DAY Plotnikov, [name], MD Taking Active   FLUoxetine (PROZAC) 40 MG capsule [id number] Yes Take 1 capsule (40 mg total) by mouth every morning. Plotnikov, [name], MD Taking Active Self  furosemide (LASIX) 20 MG tablet [id number] Yes TAKE 1-2 TABLETS (20-40 MG TOTAL) BY MOUTH DAILY.  Patient taking differently: Take 20 mg by mouth daily.   Plotnikov, [name], MD Taking Active   ipratropium-albuterol (DUONEB) 0.5-2.5 (3) MG/3ML SOLN [id number] No Take 3 mLs by nebulization every 6 (six) hours as needed (Asthma).  Patient not taking: Reported on 12/09/2020   [provider] Not Taking Active Self  levothyroxine (SYNTHROID) 100 MCG tablet [id number] Yes TAKE 1 TABLET BY MOUTH EVERY DAY BEFORE BREAKFAST  Patient taking differently: Take 100 mcg by mouth daily before breakfast.   Plotnikov, [name], MD Taking Active   lisinopril (ZESTRIL) 20 MG tablet [id number] Yes Take 20 mg by mouth at bedtime. [provider] Taking Active   mirtazapine (REMERON) 30 MG tablet [id number] Yes Take 1 tablet (30 mg total) by mouth at bedtime. Plotnikov, [name], MD Taking Active Self  Multiple Vitamins-Minerals (MULTIVITAMIN WITH MINERALS) tablet  [id number] Yes Take 1 tablet by mouth daily. Niel Hummer, NP Taking Active Self  Omega-3 Fatty Acids (FISH OIL) 1000 MG CPDR [id number] Yes Take 1,000 mg by mouth daily. [provider] Taking Active Self  ondansetron (ZOFRAN) 4 MG tablet [id number] No Take 1 tablet (4 mg total) by mouth every 8 (eight) hours as needed for nausea or vomiting.  Patient not taking: Reported on 12/09/2020   Gery Pray, MD Not Taking Active   Polyethyl Glycol-Propyl Glycol [facility]) [id number] Yes Place 1 drop into both eyes 2 (two) times daily as needed (dry eyes).  [provider] Taking Active Self  progesterone (PROMETRIUM) 100 MG capsule [id number] Yes Take 100 mg by mouth at bedtime. [provider] Taking Active Self  Spirulina 500 MG TABS [id number] Yes Take 1 tablet by mouth daily. [provider] Taking Active             Patient Active Problem List   Diagnosis Date Noted   Low BP 09/10/2020   Physical deconditioning 09/10/2020   Osteopenia 03/24/2020   Genital herpes simplex 03/24/2020   Chronic fatigue syndrome 03/24/2020   Bipolar I disorder ([place]) 03/24/2020   Wears glasses    Urgency of urination    Tuberculosis    Status post chemotherapy    S/P dilatation of esophageal stricture    Renal calculus, bilateral    Positive reaction to tuberculin skin test    Pneumonia    Mild persistent asthma    IDA (iron deficiency anemia)    Hx of transfusion of packed red blood cells    History of syncope    History of positive PPD    History of kidney stones    History of DVT (deep vein thrombosis)    History of concussion  GERD (gastroesophageal reflux disease)    GAD (generalized anxiety disorder)    CKD (chronic kidney disease), stage III (HCC)    Chronically dry eyes    Chronic cystitis    Chronic bronchitis (HCC)    Arthritis    Uterine sarcoma ([place]) 10/30/2019   Pelvic mass in female 09/20/2019   Ureteral stone with hydronephrosis 09/04/2019    Edema 08/27/2019   Bladder pain 08/2019   Dysuria 05/17/2019   Suspected 2019 novel coronavirus infection 04/25/2019   Urinary urgency 03/06/2019   Diarrhea 03/06/2019   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   Dyspnea on exertion 08/25/2017   Chest pain in adult 07/13/2017   HTN (hypertension) 05/31/2017   Anemia 05/31/2017   Leg pain, anterior, left 12/15/2016   Paresthesia 12/15/2016   Syncope 10/13/2016   Migraine headache 10/13/2016   Asthma 10/13/2016   Anxiety 10/13/2016   Hyperlipidemia 10/13/2016   UTI (urinary tract infection) 10/13/2016   Hypothyroidism 10/13/2016   Laryngopharyngeal reflux (LPR) 06/15/2016   Acute upper respiratory infection 05/05/2016   Degenerative disc disease, cervical 11/29/2015   IBS (irritable bowel syndrome) 11/24/2015   Facet arthritis of cervical region 10/16/2015   Chronic urinary tract infection 07/22/2015   Malaise and fatigue 04/16/2015   Severe episode of recurrent major depressive disorder, without psychotic features ([place]) 12/18/2014   Post concussion syndrome 10/28/2014   Brain syndrome, 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   Depression 10/25/2014   Major depressive disorder, recurrent, severe without psychotic features ([place])    Cervical nerve root disorder 09/18/2014   Concussion w/o coma 09/18/2014   Irritable bowel syndrome with diarrhea 09/12/2014   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[place]) 04/10/2014   Cold intolerance 03/26/2014   Cough variant asthma  vs UACS/ irritable larynx  03/26/2014   Abdominal pain 03/26/2014   Positive skin test 03/26/2014   Adjustment disorder with anxiety 02/07/2014   Cervical pain 12/05/2013   Fibromyalgia 12/05/2013   Cephalalgia 12/05/2013   Adult hypothyroidism 12/05/2013   Adaptive colitis 12/05/2013   Menopause 12/05/2013   Mild cognitive disorder 12/05/2013   Feeling bilious 12/05/2013   Leiomyosarcoma ([place]) 09/10/2013   Lung mass 09/10/2013    Lung nodule, solitary 09/10/2013   Abnormal chest x-ray    Unspecified hypothyroidism 04/25/2013   Vitamin D deficiency 04/25/2013   Leiomyosarcoma of uterus ([place]) 04/16/2013   Female genuine stress incontinence 04/12/2012   Healthcare maintenance 10/30/2010   MUSCLE PAIN 07/03/2008   BIPLR I MOST RECENT EPIS DPRS SEV NO PSYCHOT BHV 02/14/2008   DEEP PHLEBOTHROMBOSIS PP UNSPEC AS EPIS CARE 02/14/2008   Bipolar affective disorder, current episode depressed ([place]) 02/14/2008   Deep phlebothrombosis, postpartum 02/14/2008   HYPERLIPIDEMIA 03/04/2007   ALLERGIC RHINITIS 03/04/2007   GERD 03/04/2007   IRRITABLE BOWEL SYNDROME, HX OF 03/04/2007    Immunization History  Administered Date(s) Administered   Fluad Quad(high Dose 65+) 04/14/2020   H1N1 06/14/2008   Influenza Whole 02/14/2008, 02/17/2009, 03/17/2013   Influenza, High Dose Seasonal PF 04/02/2015, 05/31/2017, 03/16/2018, 12/27/2018   Influenza-Unspecified 03/24/2016   MMR 06/19/2009   PFIZER(Purple Top)SARS-COV-2 Vaccination 06/05/2019, 06/23/2019, 02/14/2020   Pneumococcal Conjugate-13 05/05/2016   Pneumococcal Polysaccharide-23 05/31/2017   Td 05/17/2000, 06/19/2009   Zoster, Live 04/16/2014    Conditions to be addressed/monitored:  Hypertension, Hyperlipidemia, GERD, Chronic Kidney Disease, Depression, Anxiety, Osteopenia, IBS, Migraines, Chronic UTIs, and Hypothyroidism  Care Plan : CCM Care Plan  Updates made by Tomasa Blase, [place] since 12/10/2020 12:00 AM  Problem: Hypertension, Hyperlipidemia, GERD, Chronic Kidney Disease, Depression, Anxiety, Osteopenia, IBS, Migraines, Chronic UTIs, and Hypothyroidism   Priority: High  Onset Date: 12/09/2020     Long-Range Goal: Disease Management   Start Date: 12/10/2020  Expected End Date: 06/12/2021  This Visit's Progress: On track  Priority: High  Note:   Current Barriers:  Unable to independently monitor therapeutic efficacy  Pharmacist Clinical Goal(s):   Patient will achieve adherence to monitoring guidelines and medication adherence to achieve therapeutic efficacy through collaboration with PharmD and provider.   Interventions: 1:1 collaboration with Plotnikov, [name], MD regarding development and update of comprehensive plan of care as evidenced by provider attestation and co-signature Inter-disciplinary care team collaboration (see longitudinal plan of care) Comprehensive medication review performed; medication list updated in electronic medical record  Hypertension (BP goal <130/80) -Controlled -Current treatment: Lisinopril 27m - 1 tablet daily  Furosemide 283m- 1 tablets daily -Medications previously tried: amlodipine, candesartan, carvedilol, hctz, nebivolol, telmisartan, verapamil, maxide  -Current home readings: averaging ~ 120/80 -Current dietary habits: follows a low sodium diet -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, 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, and walking 10 minutes twice daily  -Denies hypotensive/hypertensive symptoms -Educated on BP goals and benefits of medications for prevention of heart attack, stroke and kidney damage; Daily salt intake goal < 2300 mg; Exercise goal of 150 minutes per week; Importance of home blood pressure monitoring; Proper BP monitoring technique; Symptoms of hypotension and importance of maintaining adequate hydration; -Counseled to monitor BP at home daily, document, and provide log at future appointments -Counseled on diet and exercise extensively Recommended to continue current medication  Hyperlipidemia: (LDL goal < 130) -Not ideally controlled -Last LDL 160 mg/dL (10/18/2018) -Current treatment: Fish Oil 1,00020m 1 capsule daily  Aspirin 20m41m1 tablet daily  Colesevelam 625mg74m tablets twice daily  -Medications previously tried: n/a  -Current dietary patterns: reports to a diet low in fried/fatty foods  -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 PlovsCasimiro Needlellows every 3 months   -Controlled -Current treatment: Alprazolam 0.5mg -55mtablet nightly as needed  Buspirone 30mg -26mablet twice daily  Fluoxetine 40mg - 53mpsule daily  Mirtazepine 30mg - 164mlet at bedtime  -Medications previously tried/failed: abilify, wellbutrin, ctialopram, lithium, quetiapine, trazodone, venlafaxine, zolpidem, latuda -PHQ9: 5 -GAD7: 0 -Connected with Follows with Dr. GuttermanCheryln Manlyal health support every month -Educated on Benefits of medication for symptom control Benefits of cognitive-behavioral therapy with or without medication -Recommended to continue current medication  Hypothyroidism (Goal: Maintenance of euthyroid levels) -Controlled -Last TSH level 1.26uIU/mL (04/14/2020) -Current treatment  Levothyroxine 100mcg - 174mlet daily  -Medications previously tried: n/a  -Recommended to continue current medication  Iron Deficiency Anemia (Goal: Maintenance of appropriate Fe levels / prevention of associated symptoms) -Controlled -Last Iron, TIBC, and Ferritin levels - WNL (10/09/2020) -Current treatment  Ferrex 150mg - 1 c45mle twice daily  -Medications previously tried: n/a  -Counseled on diet and exercise extensively Recommended to continue  current medication  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 (Goal: prevention of UTI) -Controlled -Current treatment  D-Mannose 560m w/ cranberry and dandelion extract -  1 capsule twice daily  Estradiol 147m- 1 tablet daily  Progesterone 10020m 1 capsule daily  -Medications previously tried: estrace vaginal cream -Recommended to continue current medication  Chronic Migraines (Goal: Migraine symptoms control / treatment) -Controlled -Current treatment  Ondansetron 4mg76m1 tablet every 8 hours as needed for nausea  Acetaminphen 325mg51mtablets twice daily  -Medications previously tried: sumatriptan (causes BP increase and worsening of HA)  -Recommended to continue current medication - Patient reports that she finds the most relief when she covers her eyes, uses ice on her head, takes acetaminophen and lies down with a migraine onset - rarely had to use ondansetron for nausea but has if needed    Osteopenia (Goal prevention of fractures) -Controlled -Last DEXA Scan: 08/08/2017   T-Score right femoral neck: -1.10  T-Score left femoral neck: -1.10  T-Score right total femur: -0.40  T-Score left total femur: -0.60  T-Score AP spine: +0.5  10-year probability of major osteoporotic fracture: 24%  10-year probability of hip fracture: 1.9% -Patient is a candidate for pharmacologic treatment due to T-Score -1.0 to -2.5 and 10-year risk of major osteoporotic fracture > 20% -Bisphosphonate therapy cautioned in setting of current CKD  -CrCl: 42.915 mL/min  -Current treatment  Vitamin D 5000 units daily  -Medications previously tried: n/a  -Recommend [id number] units of vitamin D daily. Recommend 1200 mg of calcium daily from dietary and supplemental sources. Recommend weight-bearing and muscle strengthening exercises for building and maintaining bone density. -Counseled on diet and exercise extensively Recommended to continue current medication   GERD / IBS-D (Goal: Acid control / prevention of acid reflux / promotion of regular bowel movements) -Controlled -Current treatment  Colesevelam 625mg 62mtablets twice daily  Metamucil - 1 capsule twice daily   Probiotic - 1 capsule daily Immodium  2mg - 48mablet daily as needed  -Medications previously tried: dicyclomine, lomotil, esomeprazole,   -Recommended to continue current medication  Chronic Kidney Disease (Goal: prevention of disease progression) -Controlled - stable  -Last eGFR 38.72 mL/min -Current treatment  Avoidance of nephrotoxic medications / adequate blood pressure control to prevent kidney damage   -Recommended to continue current medication  Health Maintenance -Vaccine gaps: shingles, tetanus booster, and COVID booster  -Current therapy:  Systane eye drops - 1 drop into each eye twice daily  Multivitamin - 1 tablet daily  Acetaminophen 325mg - 66mblets every 6 hours as needed  Metamucil - 1 capsule twice daily  Bromelain 750mg - 135msule daily  -Educated on Herbal supplement research is limited and benefits usually cannot be proven Cost vs benefit of each product must be carefully weighed by individual consumer Supplements may interfere with prescription drugs -Patient is satisfied with current therapy and denies issues -Recommended to continue current medication  Patient Goals/Self-Care Activities Patient will:  - take medications as prescribed check blood pressure daily, document, and provide at future appointments collaborate with provider on medication access solutions target a minimum of 150 minutes of moderate intensity exercise weekly       Medication Assistance: None required.  Patient affirms current coverage meets needs.  Care Gaps: Mammogram   Patient's preferred pharmacy is:  [id number] [id number] [phone] box? Yes Pt  endorses 100% compliance  Care Plan and Follow Up Patient Decision:  Patient agrees to Care Plan and Follow-up.  Plan: Telephone follow up appointment with care management team member scheduled for:  8 weeks   and The patient has been provided with contact information for the care management team and has been advised to call with any health related questions or concerns.   Tomasa Blase, PharmD Clinical Pharmacist, [place] screening examination/treatment/procedure(s) were performed by non-physician practitioner and as supervising physician I was immediately available for consultation/collaboration.  I agree with above. Lew Dawes, MD

## 2020-12-10 ENCOUNTER — Ambulatory Visit: Payer: Medicare Other | Admitting: Psychology

## 2020-12-10 NOTE — Patient Instructions (Signed)
Visit Information   PATIENT GOALS:   Goals Addressed             This Visit's Progress    Track and Manage My Symptoms-Depression       Timeframe:  Long-Range Goal Priority:  High Start Date:   12/09/2020                          Expected End Date:  06/11/2021                     Follow Up Date 03/26/2021   - avoid negative self-talk - develop a personal safety plan - develop a plan to deal with triggers like holidays, anniversaries - exercise at least 2 to 3 times per week - have a plan for how to handle bad days - spend time or talk with others at least 2 to 3 times per week - watch for early signs of feeling worse    Why is this important?   Keeping track of your progress will help your treatment team find the right mix of medicine and therapy for you.  Write in your journal every day.  Day-to-day changes in depression symptoms are normal. It may be more helpful to check your progress at the end of each week instead of every day.          Consent to CCM Services: Ms. Mulkern was given information about Chronic Care Management services today including:  CCM service includes personalized support from designated clinical staff supervised by her physician, including individualized plan of care and coordination with other care providers 24/7 contact phone numbers for assistance for urgent and routine care needs. Service will only be billed when office clinical staff spend 20 minutes or more in a month to coordinate care. Only one practitioner may furnish and bill the service in a calendar month. The patient may stop CCM services at any time (effective at the end of the month) by phone call to the office staff. The patient will be responsible for cost sharing (co-pay) of up to 20% of the service fee (after annual deductible is met).  Patient agreed to services and verbal consent obtained.   Patient verbalizes understanding of instructions provided today and agrees to  view in [place].   Telephone follow up appointment with care management team member scheduled for: The patient has been provided with contact information for the care management team and has been advised to call with any health related questions or concerns.   Tomasa Blase, PharmD Clinical Pharmacist, [place]   CLINICAL CARE PLAN: Patient Care Plan: CCM Care Plan     Problem Identified: Hypertension, Hyperlipidemia, GERD, Chronic Kidney Disease, Depression, Anxiety, Osteopenia, IBS, Migraines, Chronic UTIs, and Hypothyroidism   Priority: High  Onset Date: 12/09/2020     Long-Range Goal: Disease Management   Start Date: 12/10/2020  Expected End Date: 06/12/2021  This Visit's Progress: On track  Priority: High  Note:   Current Barriers:  Unable to independently monitor therapeutic efficacy  Pharmacist Clinical Goal(s):  Patient will achieve adherence to monitoring guidelines and medication adherence to achieve therapeutic efficacy through collaboration with PharmD and provider.   Interventions: 1:1 collaboration with Plotnikov, [name], MD regarding development and update of comprehensive plan of care as evidenced by provider attestation and co-signature Inter-disciplinary care team collaboration (see longitudinal plan of care) Comprehensive medication review performed; medication list updated in electronic medical record  Hypertension (BP goal <130/80) -Controlled -Current treatment: Lisinopril 30m - 1 tablet daily  Furosemide 264m- 1 tablets daily -Medications previously tried: amlodipine, candesartan, carvedilol, hctz, nebivolol, telmisartan, verapamil, maxide  -Current home readings: averaging ~ 120/80 -Current dietary habits: follows a low sodium diet -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, 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, and walking 10 minutes twice daily  -Denies hypotensive/hypertensive symptoms -Educated on BP goals and benefits of  medications for prevention of heart attack, stroke and kidney damage; Daily salt intake goal < 2300 mg; Exercise goal of 150 minutes per week; Importance of home blood pressure monitoring; Proper BP monitoring technique; Symptoms of hypotension and importance of maintaining adequate hydration; -Counseled to monitor BP at home daily, document, and provide log at future appointments -Counseled on diet and exercise extensively Recommended to continue current medication  Hyperlipidemia: (LDL goal < 130) -Not ideally controlled -Last LDL 160 mg/dL (10/18/2018) -Current treatment: Fish Oil 1,00028m 1 capsule daily  Aspirin 3m37m1 tablet daily  Colesevelam 625mg62m tablets twice daily  -Medications previously tried: n/a  -Current dietary patterns: reports to a diet low in fried/fatty foods  -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 PlovsCasimiro Needlellows every 3 months   -Controlled -Current treatment: Alprazolam 0.5mg -39mtablet nightly as needed  Buspirone 30mg -90mablet twice daily  Fluoxetine 40mg - 31mpsule daily  Mirtazepine 30mg - 191mlet at bedtime  -Medications previously tried/failed: abilify, wellbutrin, ctialopram, lithium, quetiapine, trazodone, venlafaxine, zolpidem, latuda -PHQ9: 5 -GAD7: 0 -Connected with Follows with Dr. GuttermanCheryln Manlyal health support every month -Educated on Benefits of medication for symptom control Benefits of  cognitive-behavioral therapy with or without medication -Recommended to continue current medication  Hypothyroidism (Goal: Maintenance of euthyroid levels) -Controlled -Last TSH level 1.26uIU/mL (04/14/2020) -Current treatment  Levothyroxine 100mcg - 1106mlet daily  -Medications previously tried: n/a  -Recommended to continue current medication  Iron Deficiency Anemia (Goal: Maintenance of appropriate Fe levels / prevention of associated symptoms) -Controlled -Last Iron, TIBC, and Ferritin levels - WNL (10/09/2020) -Current treatment  Ferrex 150mg - 1 c28mle twice daily  -Medications previously tried: n/a  -Counseled on diet and exercise extensively Recommended to continue current medication  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 (Goal: prevention of UTI) -Controlled -Current treatment  D-Mannose 500mg w/ cra38mry and dandelion extract - 1 capsule twice daily  Estradiol 1mg - 1 tabl72mdaily  Progesterone 100mg - 1 caps30mdaily  -Medications previously tried: estrace vaginal cream -Recommended to continue current medication  Chronic Migraines (Goal: Migraine symptoms control / treatment) -Controlled -Current treatment  Ondansetron 4mg - 1 tablet7mery 8 hours as needed for nausea  Acetaminphen 325mg -2 tablets27mce daily  -Medications previously tried: sumatriptan (causes BP increase and worsening of HA)  -Recommended to continue current medication - Patient reports that she finds the most relief when she covers her eyes, uses ice on her head, takes acetaminophen and lies down with a migraine onset - rarely  had to use ondansetron for nausea but has if needed    Osteopenia (Goal prevention of fractures) -Controlled -Last DEXA Scan: 08/08/2017   T-Score right femoral neck: -1.10  T-Score left femoral neck: -1.10  T-Score right total femur: -0.40  T-Score left total femur: -0.60  T-Score AP spine: +0.5  10-year probability of major osteoporotic  fracture: 24%  10-year probability of hip fracture: 1.9% -Patient is a candidate for pharmacologic treatment due to T-Score -1.0 to -2.5 and 10-year risk of major osteoporotic fracture > 20% -Bisphosphonate therapy cautioned in setting of current CKD  -CrCl: 42.915 mL/min  -Current treatment  Vitamin D 5000 units daily  -Medications previously tried: n/a  -Recommend [phone] units of vitamin D daily. Recommend 1200 mg of calcium daily from dietary and supplemental sources. Recommend weight-bearing and muscle strengthening exercises for building and maintaining bone density. -Counseled on diet and exercise extensively Recommended to continue current medication   GERD / IBS-D (Goal: Acid control / prevention of acid reflux / promotion of regular bowel movements) -Controlled -Current treatment  Colesevelam 676m - 2 tablets twice daily  Metamucil - 1 capsule twice daily  Probiotic - 1 capsule daily Immodium  275m- 1 tablet daily as needed  -Medications previously tried: dicyclomine, lomotil, esomeprazole,   -Recommended to continue current medication  Chronic Kidney Disease (Goal: prevention of disease progression) -Controlled - stable  -Last eGFR 38.72 mL/min -Current treatment  Avoidance of nephrotoxic medications / adequate blood pressure control to prevent kidney damage   -Recommended to continue current medication  Health Maintenance -Vaccine gaps: shingles, tetanus booster, and COVID booster  -Current therapy:  Systane eye drops - 1 drop into each eye twice daily  Multivitamin - 1 tablet daily  Acetaminophen 32559m 2 tablets every 6 hours as needed  Metamucil - 1 capsule twice daily  Bromelain 750m17m1 capsule daily  -Educated on Herbal supplement research is limited and benefits usually cannot be proven Cost vs benefit of each product must be carefully weighed by individual consumer Supplements may interfere with prescription drugs -Patient is satisfied with current  therapy and denies issues -Recommended to continue current medication  Patient Goals/Self-Care Activities Patient will:  - take medications as prescribed check blood pressure daily, document, and provide at future appointments collaborate with provider on medication access solutions target a minimum of 150 minutes of moderate intensity exercise weekly

## 2020-12-11 ENCOUNTER — Encounter: Payer: Self-pay | Admitting: Internal Medicine

## 2020-12-11 ENCOUNTER — Ambulatory Visit (INDEPENDENT_AMBULATORY_CARE_PROVIDER_SITE_OTHER): Payer: Medicare Other | Admitting: Internal Medicine

## 2020-12-11 ENCOUNTER — Other Ambulatory Visit: Payer: Self-pay

## 2020-12-11 DIAGNOSIS — R300 Dysuria: Secondary | ICD-10-CM | POA: Diagnosis not present

## 2020-12-11 DIAGNOSIS — R5381 Other malaise: Secondary | ICD-10-CM

## 2020-12-11 DIAGNOSIS — I10 Essential (primary) hypertension: Secondary | ICD-10-CM

## 2020-12-11 DIAGNOSIS — R5383 Other fatigue: Secondary | ICD-10-CM | POA: Diagnosis not present

## 2020-12-11 DIAGNOSIS — E559 Vitamin D deficiency, unspecified: Secondary | ICD-10-CM

## 2020-12-11 DIAGNOSIS — C499 Malignant neoplasm of connective and soft tissue, unspecified: Secondary | ICD-10-CM | POA: Diagnosis not present

## 2020-12-11 DIAGNOSIS — E039 Hypothyroidism, unspecified: Secondary | ICD-10-CM | POA: Diagnosis not present

## 2020-12-11 DIAGNOSIS — I700 Atherosclerosis of aorta: Secondary | ICD-10-CM

## 2020-12-11 DIAGNOSIS — N390 Urinary tract infection, site not specified: Secondary | ICD-10-CM

## 2020-12-11 LAB — COMPREHENSIVE METABOLIC PANEL
ALT: 24 U/L (ref 0–35)
AST: 24 U/L (ref 0–37)
Albumin: 4.1 g/dL (ref 3.5–5.2)
Alkaline Phosphatase: 120 U/L — ABNORMAL HIGH (ref 39–117)
BUN: 33 mg/dL — ABNORMAL HIGH (ref 6–23)
CO2: 20 mEq/L (ref 19–32)
Calcium: 10.2 mg/dL (ref 8.4–10.5)
Chloride: 103 mEq/L (ref 96–112)
Creatinine, Ser: 1.58 mg/dL — ABNORMAL HIGH (ref 0.40–1.20)
GFR: 32.88 mL/min — ABNORMAL LOW (ref >60.00)
Glucose, Bld: 111 mg/dL — ABNORMAL HIGH (ref 70–99)
Potassium: 3.7 mEq/L (ref 3.5–5.1)
Sodium: 135 mEq/L (ref 135–145)
Total Bilirubin: 0.3 mg/dL (ref 0.2–1.2)
Total Protein: 7.2 g/dL (ref 6.0–8.3)

## 2020-12-11 LAB — URINALYSIS, ROUTINE W REFLEX MICROSCOPIC
Bilirubin Urine: NEGATIVE
Ketones, ur: NEGATIVE
Nitrite: NEGATIVE
Specific Gravity, Urine: 1.01 (ref 1.000–1.030)
Total Protein, Urine: NEGATIVE
Urine Glucose: NEGATIVE
Urobilinogen, UA: 0.2 (ref 0.0–1.0)
pH: 5.5 (ref 5.0–8.0)

## 2020-12-11 LAB — CBC WITH DIFFERENTIAL/PLATELET
Basophils Absolute: 0.1 10*3/uL (ref 0.0–0.1)
Basophils Relative: 0.8 % (ref 0.0–3.0)
Eosinophils Absolute: 0.4 10*3/uL (ref 0.0–0.7)
Eosinophils Relative: 3.1 % (ref 0.0–5.0)
HCT: 38 % (ref 36.0–46.0)
Hemoglobin: 12.4 g/dL (ref 12.0–15.0)
Lymphocytes Relative: 19.3 % (ref 12.0–46.0)
Lymphs Abs: 2.3 10*3/uL (ref 0.7–4.0)
MCHC: 32.6 g/dL (ref 30.0–36.0)
MCV: 93.4 fl (ref 78.0–100.0)
Monocytes Absolute: 0.7 10*3/uL (ref 0.1–1.0)
Monocytes Relative: 6 % (ref 3.0–12.0)
Neutro Abs: 8.4 10*3/uL — ABNORMAL HIGH (ref 1.4–7.7)
Neutrophils Relative %: 70.8 % (ref 43.0–77.0)
Platelets: 432 10*3/uL — ABNORMAL HIGH (ref 150.0–400.0)
RBC: 4.07 Mil/uL (ref 3.87–5.11)
RDW: 13.8 % (ref 11.5–15.5)
WBC: 11.8 10*3/uL — ABNORMAL HIGH (ref 4.0–10.5)

## 2020-12-11 NOTE — Assessment & Plan Note (Signed)
Start PT 

## 2020-12-11 NOTE — Assessment & Plan Note (Signed)
Maxzide, Micardis, Bystolic

## 2020-12-11 NOTE — Progress Notes (Signed)
Subjective:  Patient ID: [name], female    DOB: 07-16-1950  Age: 70 y.o. MRN: [id number]  CC: Follow-up (3 month f/u)   HPI [name] presents for a possible UTI F/u low BP - IV Hydration from [employer]  Outpatient Medications Prior to Visit  Medication Sig Dispense Refill   acetaminophen (TYLENOL) 325 MG tablet Take 650 mg by mouth every 6 (six) hours as needed for moderate pain.      ALPRAZolam (XANAX) 0.5 MG tablet Take 1 tablet (0.5 mg total) by mouth 2 (two) times daily as needed for anxiety. (Patient taking differently: Take 0.5 mg by mouth at bedtime as needed for anxiety.) 60 tablet 1   aspirin EC 81 MG tablet Take 81 mg by mouth daily.     Bromelains (BROMELAIN PO) Take 750 mg by mouth daily.     busPIRone (BUSPAR) 30 MG tablet Take 30 mg by mouth 2 (two) times daily.     Cholecalciferol (VITAMIN D3) 125 MCG (5000 UT) CAPS Take 1 capsule by mouth daily.     colesevelam (WELCHOL) 625 MG tablet Take 2 tablets (1,250 mg total) by mouth 2 (two) times daily with a meal. 120 tablet 11   D-Mannose 500 MG CAPS Take 500 mg by mouth 2 (two) times daily. WITH CRANBERRY & DANDELION EXTRACT     esomeprazole (NEXIUM) 40 MG capsule Take 1 by mouth in the morning     estradiol (ESTRACE) 1 MG tablet Take 1 tablet (1 mg total) by mouth daily. 90 tablet 3   FERREX 150 150 MG capsule TAKE 1 CAPSULE BY MOUTH TWICE A DAY 180 capsule 3   FLUoxetine (PROZAC) 40 MG capsule Take 1 capsule (40 mg total) by mouth every morning. 90 capsule 1   furosemide (LASIX) 20 MG tablet TAKE 1-2 TABLETS (20-40 MG TOTAL) BY MOUTH DAILY. (Patient taking differently: Take 20 mg by mouth daily.) 180 tablet 1   ipratropium-albuterol (DUONEB) 0.5-2.5 (3) MG/3ML SOLN Take 3 mLs by nebulization every 6 (six) hours as needed (Asthma).     levothyroxine (SYNTHROID) 100 MCG tablet TAKE 1 TABLET BY MOUTH EVERY DAY BEFORE BREAKFAST (Patient taking differently: Take 100 mcg by mouth daily before breakfast.) 90  tablet 2   lisinopril (ZESTRIL) 20 MG tablet Take 20 mg by mouth at bedtime.     mirtazapine (REMERON) 30 MG tablet Take 1 tablet (30 mg total) by mouth at bedtime. 90 tablet 1   Multiple Vitamins-Minerals (MULTIVITAMIN WITH MINERALS) tablet Take 1 tablet by mouth daily.     Omega-3 Fatty Acids (FISH OIL) 1000 MG CPDR Take 1,000 mg by mouth daily.     OVER THE COUNTER MEDICATION 7 MUSHROOM BLEND take 1 capsule every day     Polyethyl Glycol-Propyl Glycol (SYSTANE OP) Place 1 drop into both eyes 2 (two) times daily as needed (dry eyes).      progesterone (PROMETRIUM) 100 MG capsule Take 100 mg by mouth at bedtime.     Spirulina 500 MG TABS Take 1 tablet by mouth daily.     ondansetron (ZOFRAN) 4 MG tablet Take 1 tablet (4 mg total) by mouth every 8 (eight) hours as needed for nausea or vomiting. (Patient not taking: No sig reported) 20 tablet 0   No facility-administered medications prior to visit.    ROS: Review of Systems  Constitutional:  Negative for activity change, appetite change, chills, fatigue and unexpected weight change.  HENT:  Negative for congestion, mouth sores and sinus pressure.  Eyes:  Negative for visual disturbance.  Respiratory:  Negative for cough and chest tightness.   Gastrointestinal:  Negative for abdominal pain and nausea.  Genitourinary:  Positive for dysuria, frequency and urgency. Negative for difficulty urinating and vaginal pain.  Musculoskeletal:  Negative for back pain and gait problem.  Skin:  Negative for pallor and rash.  Neurological:  Negative for dizziness, tremors, weakness, numbness and headaches.  Psychiatric/Behavioral:  Negative for confusion, sleep disturbance and suicidal ideas. The patient is nervous/anxious.    Objective:  BP 120/84 (BP Location: Left Arm)   Pulse 100   Temp 98 F (36.7 C) (Oral)   Ht 5\' 2"  (1.575 m)   Wt 160 lb 9.6 oz (72.8 kg)   SpO2 94%   BMI 29.37 kg/m   BP Readings from Last 3 Encounters:  12/11/20 120/84   10/28/20 114/69  09/10/20 104/82    Wt Readings from Last 3 Encounters:  12/11/20 160 lb 9.6 oz (72.8 kg)  10/28/20 160 lb (72.6 kg)  09/10/20 158 lb 6.4 oz (71.8 kg)    Physical Exam Constitutional:      General: She is not in acute distress.    Appearance: She is well-developed.  HENT:     Head: Normocephalic.     Right Ear: External ear normal.     Left Ear: External ear normal.     Nose: Nose normal.  Eyes:     General:        Right eye: No discharge.        Left eye: No discharge.     Conjunctiva/sclera: Conjunctivae normal.     Pupils: Pupils are equal, round, and reactive to light.  Neck:     Thyroid: No thyromegaly.     Vascular: No JVD.     Trachea: No tracheal deviation.  Cardiovascular:     Rate and Rhythm: Normal rate and regular rhythm.     Heart sounds: Normal heart sounds.  Pulmonary:     Effort: No respiratory distress.     Breath sounds: No stridor. No wheezing.  Abdominal:     General: Bowel sounds are normal. There is no distension.     Palpations: Abdomen is soft. There is no mass.     Tenderness: There is no abdominal tenderness. There is no guarding or rebound.  Musculoskeletal:        General: No tenderness.     Cervical back: Normal range of motion and neck supple. No rigidity.  Lymphadenopathy:     Cervical: No cervical adenopathy.  Skin:    Findings: No erythema or rash.  Neurological:     Mental Status: She is oriented to person, place, and time.     Cranial Nerves: No cranial nerve deficit.     Motor: No abnormal muscle tone.     Coordination: Coordination normal.     Deep Tendon Reflexes: Reflexes normal.  Psychiatric:        Behavior: Behavior normal.        Thought Content: Thought content normal.        Judgment: Judgment normal.    Lab Results  Component Value Date   WBC 6.7 10/09/2020   HGB 11.5 (L) 10/09/2020   HCT 33.3 (L) 10/09/2020   PLT 400.0 10/09/2020   GLUCOSE 88 10/09/2020   CHOL 263 (H) 10/18/2018   TRIG  271.0 (H) 10/18/2018   HDL 45.40 10/18/2018   LDLDIRECT 160.0 10/18/2018   LDLCALC UNABLE TO CALCULATE IF TRIGLYCERIDE OVER 400  mg/dL 08/26/2017   ALT 14 10/09/2020   AST 17 10/09/2020   NA 137 10/09/2020   K 3.9 10/09/2020   CL 103 10/09/2020   CREATININE 1.38 (H) 10/09/2020   BUN 25 (H) 10/09/2020   CO2 25 10/09/2020   TSH 1.26 04/14/2020   INR 1.0 07/15/2020   HGBA1C 5.5 06/27/2017    DG Chest 2 View  Result Date: 10/28/2020 CLINICAL DATA:  Right upper lobe lung mass surgery follow-up EXAM: CHEST - 2 VIEW COMPARISON:  08/05/2020 FINDINGS: Postsurgical changes right upper lobe with staple line and adjacent small soft tissue density, stable from the prior study. No new or enlarging mass. Remaining lungs are clear. No effusion. Heart size and vascularity normal. IMPRESSION: Stable chest x-ray. Postsurgical changes right upper lobe without interval change. Electronically Signed   By: Franchot Gallo M.D.   On: 10/28/2020 14:27    Assessment & Plan:      Walker Kehr, MD

## 2020-12-11 NOTE — Assessment & Plan Note (Signed)
Check UA, CBC, CMET

## 2020-12-11 NOTE — Assessment & Plan Note (Signed)
On Vit D 

## 2020-12-11 NOTE — Assessment & Plan Note (Signed)
On Synthroid

## 2020-12-11 NOTE — Assessment & Plan Note (Addendum)
Start Ceftin po UA

## 2020-12-11 NOTE — Assessment & Plan Note (Signed)
F/u w/Oncology CT ordered

## 2020-12-12 ENCOUNTER — Telehealth: Payer: Self-pay

## 2020-12-12 MED ORDER — SULFAMETHOXAZOLE-TRIMETHOPRIM 800-160 MG PO TABS: 1.0000 | ORAL_TABLET | Freq: Two times a day (BID) | ORAL | 0 refills | Status: AC

## 2020-12-12 NOTE — Telephone Encounter (Signed)
Returned call from [facility] regarding scheduling her CT Scan for August. Her scan has been scheduled for 8/12 at 0930 am. Her follow up appointment with Dr. Berline Lopes has been scheduled for 8/15 at 2 pm. Patient verbalized understanding and is in agreement of dates and times of appointments. Patient will pick up her contrast on Saturday 7/30 from the registration desk in the cancer center.

## 2020-12-12 NOTE — Telephone Encounter (Signed)
Notified pt w/MD response.../lmb 

## 2020-12-12 NOTE — Telephone Encounter (Signed)
pt has called stating she seen her labs results for her urine test and it is POS for an infection. Pt states Dr. Alain Marion stated he would send over an ABX called Ceftin for the pt. Pt is wanting meds sent in ASAP.

## 2020-12-12 NOTE — Addendum Note (Signed)
Addended by: [name] A on: 12/12/2020 12:07 PM   Modules accepted: Orders

## 2020-12-12 NOTE — Telephone Encounter (Signed)
MD is out of the office pls advise../lmb 

## 2020-12-12 NOTE — Telephone Encounter (Signed)
Given her allergy to cephalexin in the chart I have sent in bactrim 1 pill twice a day for 5 days.

## 2020-12-15 ENCOUNTER — Ambulatory Visit: Payer: Medicare Other | Admitting: Psychology

## 2020-12-18 ENCOUNTER — Ambulatory Visit: Payer: Medicare Other

## 2020-12-18 ENCOUNTER — Telehealth: Payer: Self-pay

## 2020-12-18 NOTE — Telephone Encounter (Signed)
ENCOUNTER OPENED IN ERROR

## 2020-12-19 NOTE — Telephone Encounter (Signed)
   Patient calling to report cloudy urine, bladder pressure, frequent urination. She recently finished antibiotic and continue to have pain   Please advise

## 2020-12-21 MED ORDER — CEFUROXIME AXETIL 250 MG PO TABS: 250.0000 mg | ORAL_TABLET | Freq: Two times a day (BID) | ORAL | 1 refills | Status: DC

## 2020-12-21 NOTE — Telephone Encounter (Signed)
Okay Ceftin.  Thanks

## 2020-12-21 NOTE — Addendum Note (Signed)
Addended by: Cassandria Anger on: 12/21/2020 07:16 PM   Modules accepted: Orders

## 2020-12-24 ENCOUNTER — Ambulatory Visit (INDEPENDENT_AMBULATORY_CARE_PROVIDER_SITE_OTHER): Payer: Medicare Other | Admitting: Psychology

## 2020-12-24 DIAGNOSIS — F4321 Adjustment disorder with depressed mood: Secondary | ICD-10-CM

## 2020-12-26 ENCOUNTER — Ambulatory Visit (HOSPITAL_COMMUNITY)
Admission: RE | Admit: 2020-12-26 | Discharge: 2020-12-26 | Disposition: A | Discharge status: Home / Self Care | Payer: Medicare Other | Source: Ambulatory Visit | Attending: Gynecologic Oncology | Admitting: Gynecologic Oncology

## 2020-12-26 ENCOUNTER — Other Ambulatory Visit: Payer: Self-pay

## 2020-12-26 ENCOUNTER — Inpatient Hospital Stay: Payer: Medicare Other | Admitting: Gynecologic Oncology

## 2020-12-26 DIAGNOSIS — J9811 Atelectasis: Secondary | ICD-10-CM | POA: Diagnosis not present

## 2020-12-26 DIAGNOSIS — C55 Malignant neoplasm of uterus, part unspecified: Secondary | ICD-10-CM

## 2020-12-26 DIAGNOSIS — Z8551 Personal history of malignant neoplasm of bladder: Secondary | ICD-10-CM | POA: Diagnosis not present

## 2020-12-26 DIAGNOSIS — Z85118 Personal history of other malignant neoplasm of bronchus and lung: Secondary | ICD-10-CM | POA: Diagnosis not present

## 2020-12-26 MED ORDER — SODIUM CHLORIDE 0.9 % IV SOLN
INTRAVENOUS | Status: AC
  Filled 2020-12-26: qty 250

## 2020-12-26 MED ORDER — IOHEXOL 350 MG/ML SOLN
60.0000 mL | Freq: Once | INTRAVENOUS | Status: AC | PRN
  Administered 2020-12-26: 60 mL via INTRAVENOUS

## 2020-12-26 NOTE — Progress Notes (Unsigned)
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 we save chemotherapy for future recurrence (especially one in which surgery is not feasible or complete cytoreduction cannot be achieved or diffusely metastatic disease is present).    I will plan to see Analeah in 3 months for follow-up and will repeat imaging in August.  *** minutes of total time was spent for this patient encounter, including preparation, face-to-face counseling with the patient and coordination of care, and documentation of the encounter.  Jeral Pinch, MD  Division of Gynecologic Oncology  Department of Obstetrics and Gynecology  [facility]

## 2020-12-29 ENCOUNTER — Ambulatory Visit: Payer: Medicare Other | Admitting: Gynecologic Oncology

## 2020-12-29 ENCOUNTER — Telehealth: Payer: Self-pay | Admitting: *Deleted

## 2020-12-29 ENCOUNTER — Ambulatory Visit (INDEPENDENT_AMBULATORY_CARE_PROVIDER_SITE_OTHER): Payer: Medicare Other | Admitting: Psychology

## 2020-12-29 DIAGNOSIS — F4321 Adjustment disorder with depressed mood: Secondary | ICD-10-CM

## 2020-12-29 NOTE — Telephone Encounter (Signed)
[name] called to reschedule her Appt. today due to a migraine headache. Her appt was changed to Wednesday at 3:45 and the new date/ time was confirmed with patient

## 2020-12-30 ENCOUNTER — Other Ambulatory Visit: Payer: Self-pay

## 2020-12-31 ENCOUNTER — Encounter: Payer: Self-pay | Admitting: Gynecologic Oncology

## 2020-12-31 ENCOUNTER — Inpatient Hospital Stay: Payer: Medicare Other | Attending: Gynecologic Oncology | Admitting: Gynecologic Oncology

## 2020-12-31 VITALS — BP 139/73 | HR 76 | Temp 97.7°F | Resp 18 | Ht 62.0 in | Wt 161.8 lb

## 2020-12-31 DIAGNOSIS — J449 Chronic obstructive pulmonary disease, unspecified: Secondary | ICD-10-CM | POA: Diagnosis not present

## 2020-12-31 DIAGNOSIS — Z8542 Personal history of malignant neoplasm of other parts of uterus: Secondary | ICD-10-CM | POA: Insufficient documentation

## 2020-12-31 DIAGNOSIS — N136 Pyonephrosis: Secondary | ICD-10-CM | POA: Insufficient documentation

## 2020-12-31 DIAGNOSIS — R519 Headache, unspecified: Secondary | ICD-10-CM | POA: Diagnosis not present

## 2020-12-31 DIAGNOSIS — Z923 Personal history of irradiation: Secondary | ICD-10-CM | POA: Diagnosis not present

## 2020-12-31 DIAGNOSIS — Z86718 Personal history of other venous thrombosis and embolism: Secondary | ICD-10-CM | POA: Diagnosis not present

## 2020-12-31 DIAGNOSIS — Z9221 Personal history of antineoplastic chemotherapy: Secondary | ICD-10-CM | POA: Diagnosis not present

## 2020-12-31 DIAGNOSIS — I129 Hypertensive chronic kidney disease with stage 1 through stage 4 chronic kidney disease, or unspecified chronic kidney disease: Secondary | ICD-10-CM | POA: Diagnosis not present

## 2020-12-31 DIAGNOSIS — F319 Bipolar disorder, unspecified: Secondary | ICD-10-CM | POA: Insufficient documentation

## 2020-12-31 DIAGNOSIS — C55 Malignant neoplasm of uterus, part unspecified: Secondary | ICD-10-CM

## 2020-12-31 DIAGNOSIS — N390 Urinary tract infection, site not specified: Secondary | ICD-10-CM | POA: Diagnosis not present

## 2020-12-31 DIAGNOSIS — R5382 Chronic fatigue, unspecified: Secondary | ICD-10-CM | POA: Diagnosis not present

## 2020-12-31 DIAGNOSIS — Z79899 Other long term (current) drug therapy: Secondary | ICD-10-CM | POA: Insufficient documentation

## 2020-12-31 DIAGNOSIS — N183 Chronic kidney disease, stage 3 unspecified: Secondary | ICD-10-CM | POA: Diagnosis not present

## 2020-12-31 DIAGNOSIS — Z7982 Long term (current) use of aspirin: Secondary | ICD-10-CM | POA: Insufficient documentation

## 2020-12-31 MED ORDER — CEFUROXIME AXETIL 250 MG PO TABS: 250.0000 mg | ORAL_TABLET | Freq: Two times a day (BID) | ORAL | 0 refills | Status: AC

## 2020-12-31 MED ORDER — ESTRADIOL 0.1 MG/GM VA CREA: 1.0000 | TOPICAL_CREAM | Freq: Every day | VAGINAL | 12 refills | Status: DC

## 2020-12-31 NOTE — Addendum Note (Signed)
Addended by: Earnstine Regal on: 12/31/2020 03:14 PM   Modules accepted: Orders

## 2020-12-31 NOTE — Telephone Encounter (Signed)
Patient never received the order. Pharmacy told the pt they would mail it to her but she still hasn't received it yet. Requesting it to be resent to pharmacy listed below.   [id number] - [place], [place]. Phone:  [phone]  Fax:  [phone]      Please advise

## 2020-12-31 NOTE — Telephone Encounter (Signed)
RESENT TO [facility]

## 2020-12-31 NOTE — Patient Instructions (Addendum)
Was great to see you today!  I do not see or feel any evidence of cancer.  I will plan to see you back in 3 months unless something changes.  If you have any new symptoms, please do not hesitate to call my clinic to come in to see me sooner.  I sent a prescription for your vaginal estrogen to the pharmacy. Please let me know how it goes off the estrogen and progesterone.

## 2020-12-31 NOTE — Progress Notes (Signed)
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 IMPRESSION: 1. No evidence of recurrent or metastatic disease within the chest, abdomen or pelvis. 2. Postsurgical change over the right upper lobe compatible with interval nodule excision. 3. Aortic atherosclerosis.  Assessment & Plan: [name] is a 70 y.o.  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   Patient had a recent CT scan showing no evidence of recurrent and metastatic disease.  Exam today is negative for evidence of disease.  Patient continues to struggle with some shortness of breath as well as fatigue, ongoing since last treatment.  She also is being treated currently for a urinary tract infection.  Patient has been on estrogen and progesterone replacement for some time.  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  I think it would probably be a good idea for her to come off of her estrogen.  She is going to try this and let me know how the next couple weeks goes.  Given her recurrent urinary tract infections, I think she would benefit from  being on vaginal estrogen.  I have sent in a prescription to her pharmacy for this.   We will continue with visits every 3 months at this time.  We will plan to repeat imaging 6 months from now unless the patient develops symptoms sooner than that.  Encouraged her to be out of the house and increase her activity as tolerated as I think this will help with her fatigue and deconditioning.  32 minutes of total time was spent for this patient encounter, including preparation, face-to-face counseling with the patient and coordination of care, and documentation of the encounter.  Jeral Pinch, MD  Division of Gynecologic Oncology  Department of Obstetrics and Gynecology  [facility]

## 2021-01-01 ENCOUNTER — Ambulatory Visit: Payer: Medicare Other

## 2021-01-06 ENCOUNTER — Ambulatory Visit: Payer: Medicare Other | Admitting: Physical Therapy

## 2021-01-07 ENCOUNTER — Ambulatory Visit (INDEPENDENT_AMBULATORY_CARE_PROVIDER_SITE_OTHER): Payer: Medicare Other | Admitting: Psychology

## 2021-01-07 DIAGNOSIS — F4321 Adjustment disorder with depressed mood: Secondary | ICD-10-CM | POA: Diagnosis not present

## 2021-01-08 ENCOUNTER — Ambulatory Visit: Payer: Medicare Other

## 2021-01-12 ENCOUNTER — Ambulatory Visit: Payer: Medicare Other | Admitting: Psychology

## 2021-01-14 ENCOUNTER — Ambulatory Visit: Payer: Medicare Other | Admitting: Physical Therapy

## 2021-01-21 ENCOUNTER — Ambulatory Visit: Payer: Medicare Other | Admitting: Psychology

## 2021-01-23 ENCOUNTER — Ambulatory Visit: Payer: Medicare Other

## 2021-01-26 ENCOUNTER — Ambulatory Visit: Payer: Medicare Other | Admitting: Psychology

## 2021-01-26 ENCOUNTER — Other Ambulatory Visit: Payer: Self-pay | Admitting: Internal Medicine

## 2021-02-01 IMAGING — RF DG CYSTOGRAM 3+V
11 series · 11 of 11 positions shown · non-contrast
Comparison: PET 10/01/2019.
COMPARISON: PET 10/01/2019.

Addendum:
CLINICAL DATA: Status post bilateral oophorectomy on 10/30/2019 for
recurrent leiomyosarcoma. Exclude bladder leak.

EXAM:
CYSTOGRAM
TECHNIQUE: After catheterization of the urinary bladder following sterile
technique the bladder was filled with Cysto-Hypaque 30% by drip
infusion. Serial spot images were obtained during bladder filling
and post draining.
FLUOROSCOPY TIME:  Fluoroscopy Time:  1 minutes and 30 seconds
Radiation Exposure Index (if provided by the fluoroscopic device):
24.7 mGy
Number of Acquired Spot Images: 1

[Series 1: cp_standard · 0.26mm/px · 1 of 1 slices shown (1 of 10)]
[im 1/1]
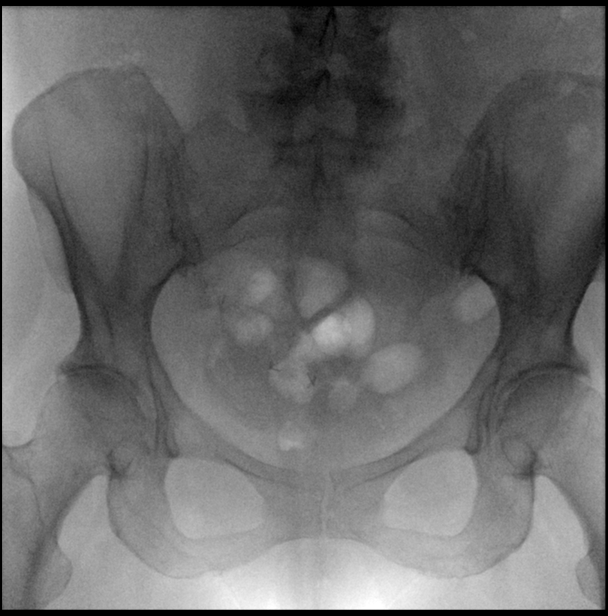

[Series 2: cp_standard · 0.17mm/px · 1 of 1 slices shown (2 of 10)]
[im 1/1]
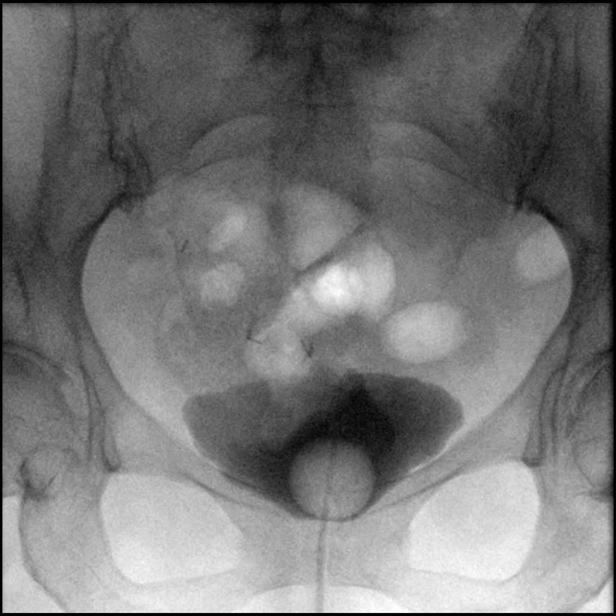

[Series 3: cp_standard · 0.17mm/px · 1 of 1 slices shown (3 of 10)]
[im 1/1]
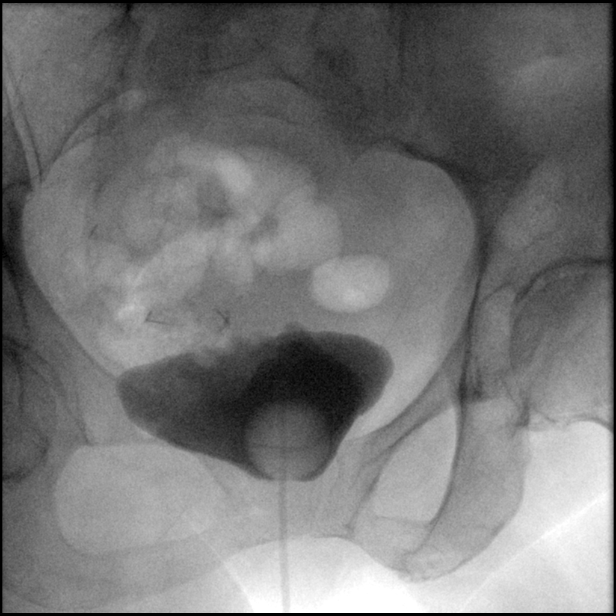

[Series 4: cp_standard · 0.17mm/px · 1 of 1 slices shown (4 of 10)]
[im 1/1]
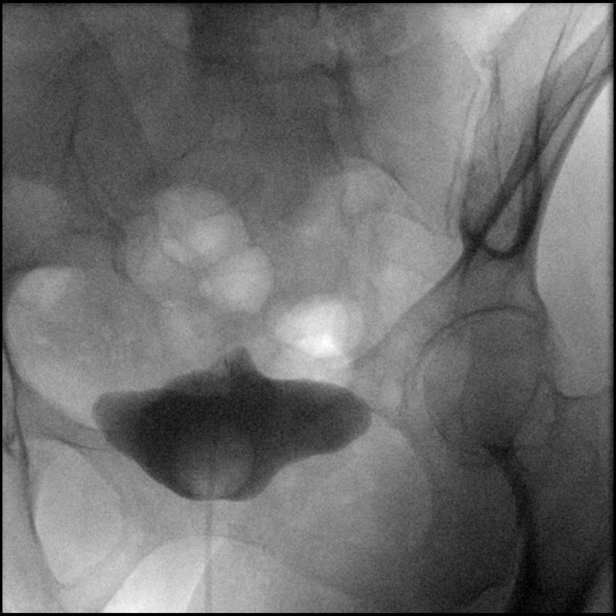

[Series 5: cp_standard · 0.17mm/px · 1 of 1 slices shown (5 of 10)]
[im 1/1]
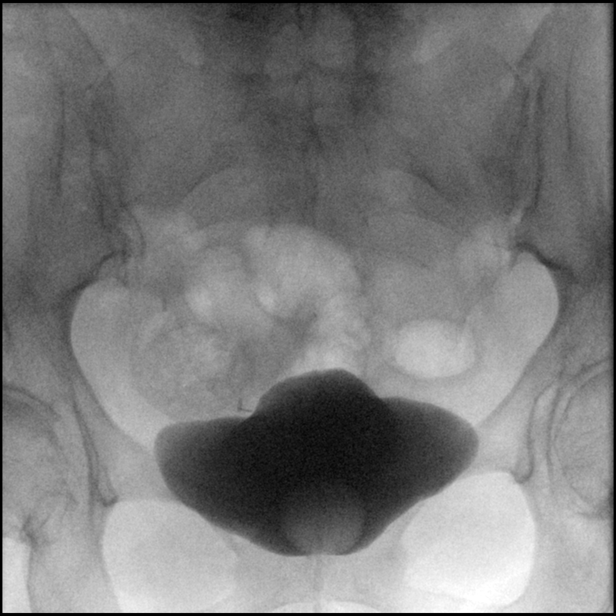

[Series 6: cp_standard · 0.17mm/px · 1 of 1 slices shown (6 of 10)]
[im 1/1]
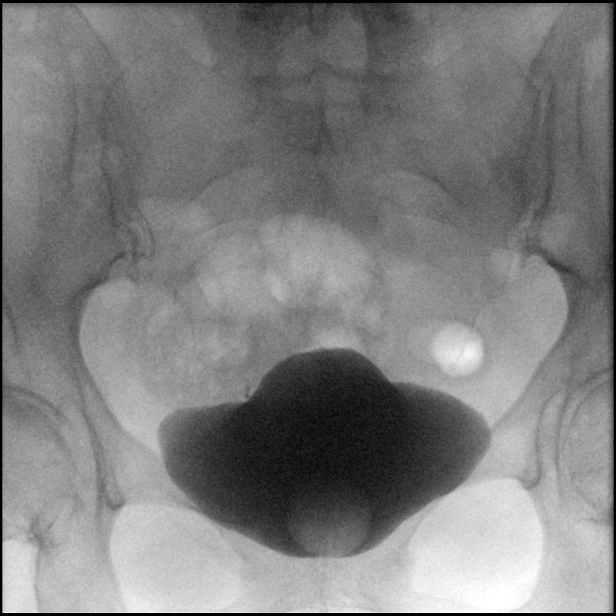

[Series 7: cp_standard · 0.17mm/px · 1 of 1 slices shown (7 of 10)]
[im 1/1]
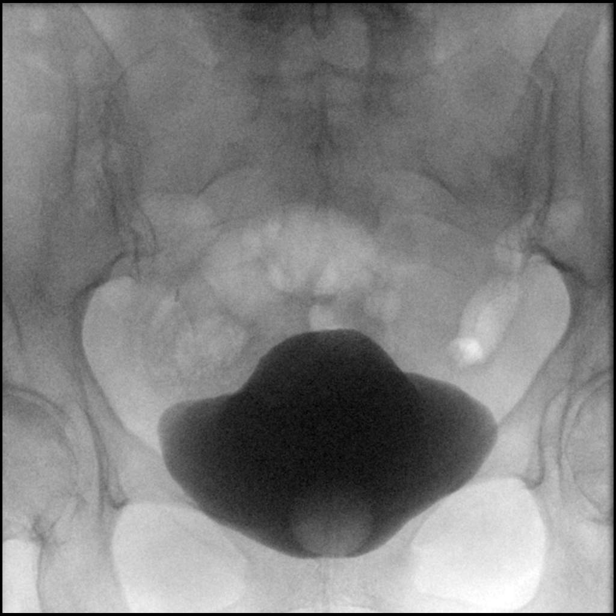

[Series 8: cp_standard · 0.17mm/px · 1 of 1 slices shown (8 of 10)]
[im 1/1]
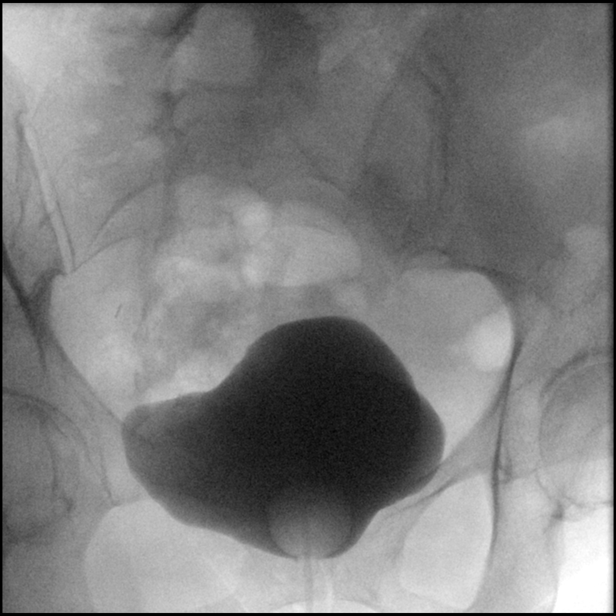

[Series 9: cp_standard · 0.17mm/px · 1 of 1 slices shown (9 of 10)]
[im 1/1]
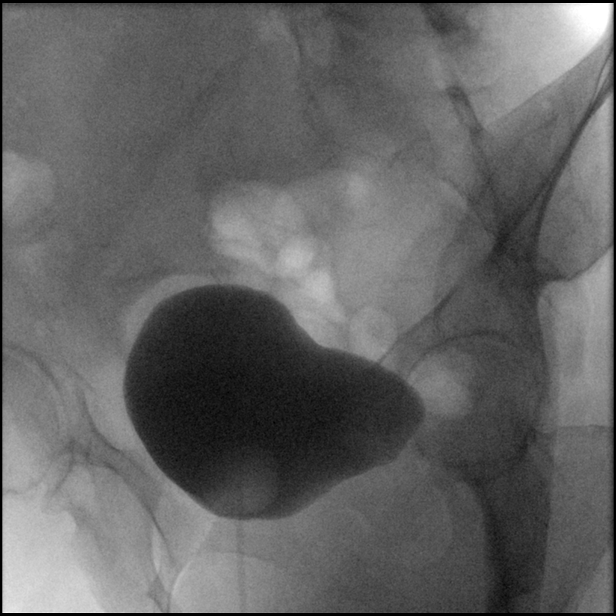

[Series 10: cp_standard · 0.26mm/px · 1 of 1 slices shown (10 of 10)]
[im 1/1]
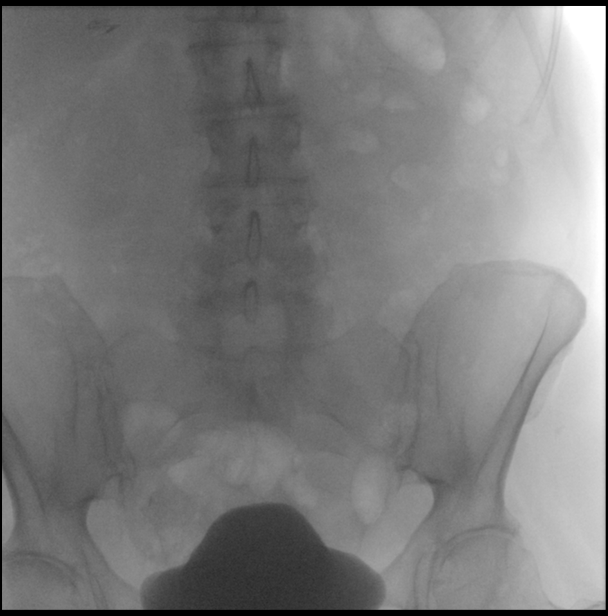

[Series 11: fluoro_barium 2fps_bw · 0.17mm/px · 1 of 1 slices shown]
[im 1/1]
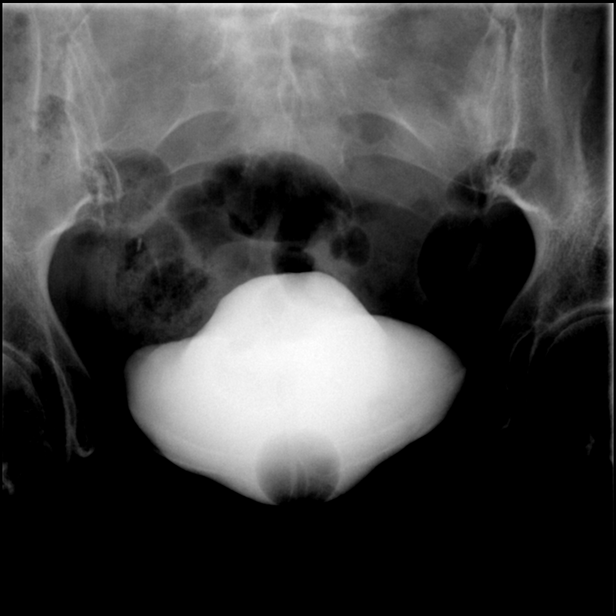

[11 of 11 positions shown; findings below may reference images not displayed]

FINDINGS: Preprocedure scout film demonstrates surgical clips within the
pelvis. Foley catheter within the bladder. Partial and complete
filled images demonstrate normal bladder caliber, without contrast
extravasation to suggest leak.
IMPRESSION: No evidence of bladder leak.

ADDENDUM:
200 cc of Eya Eya were administered.

*** End of Addendum ***
FINDINGS: Preprocedure scout film demonstrates surgical clips within the
pelvis. Foley catheter within the bladder. Partial and complete
filled images demonstrate normal bladder caliber, without contrast
extravasation to suggest leak.
IMPRESSION: No evidence of bladder leak.

## 2021-02-02 ENCOUNTER — Other Ambulatory Visit: Payer: Self-pay | Admitting: Internal Medicine

## 2021-02-03 ENCOUNTER — Telehealth: Payer: Self-pay | Admitting: Physical Therapy

## 2021-02-03 ENCOUNTER — Ambulatory Visit (INDEPENDENT_AMBULATORY_CARE_PROVIDER_SITE_OTHER): Payer: Medicare Other

## 2021-02-03 ENCOUNTER — Other Ambulatory Visit: Payer: Self-pay

## 2021-02-03 ENCOUNTER — Ambulatory Visit: Payer: Medicare Other

## 2021-02-03 DIAGNOSIS — K580 Irritable bowel syndrome with diarrhea: Secondary | ICD-10-CM

## 2021-02-03 DIAGNOSIS — N390 Urinary tract infection, site not specified: Secondary | ICD-10-CM

## 2021-02-03 DIAGNOSIS — E039 Hypothyroidism, unspecified: Secondary | ICD-10-CM

## 2021-02-03 DIAGNOSIS — I10 Essential (primary) hypertension: Secondary | ICD-10-CM

## 2021-02-03 NOTE — Progress Notes (Addendum)
Chronic Care Management Pharmacy Note  02/03/2021 Name:  [name] MRN:  [id number] DOB:  1950/08/03  Summary: - Patient reports that she is continuing to recuperate from wedge resection surgery - most recently was advised to stop oral estrogen by onc gyn, started on vaginal estrace instead - patient continues on progesterone - notes to increased incidence of hot flashes since change - plans to discuss with ob/gyn at appointment next month -Patient reports that she continues to monitor blood pressure, has been elevated at times - patient reports due to stress, but is brought under control by addition of second furosemide tablet in the day -Patient has been unable to establish with PT yet as she has been having issues with her bowels and is unable to leave her house, has requested for home PT due to that reason  Recommendations/Changes made from today's visit: - Recommending no changes to medications at this time, patient to have lipid panel rechecked with next PCP appointment, possible that LDL is in range following major dietary changes, if not patient may benefit from statin medication for LDL reduction -Recommending for home PT order be placed so that patient is able to start PT -Patient to continue to monitor blood pressure and reach out should BP control be lost prior to next appointment   Subjective: [name] is an 70 y.o. year old female who is a primary patient of Plotnikov, [name], MD.  The CCM team was consulted for assistance with disease management and care coordination needs.    Engaged with patient by telephone for return visit in response to provider referral for pharmacy case management and/or care coordination services.   Consent to Services:  The patient was given the following information about Chronic Care Management services today, agreed to services, and gave verbal consent: 1. CCM service includes personalized support from designated clinical  staff supervised by the primary care provider, including individualized plan of care and coordination with other care providers 2. 24/7 contact phone numbers for assistance for urgent and routine care needs. 3. Service will only be billed when office clinical staff spend 20 minutes or more in a month to coordinate care. 4. Only one practitioner may furnish and bill the service in a calendar month. 5.The patient may stop CCM services at any time (effective at the end of the month) by phone call to the office staff. 6. The patient will be responsible for cost sharing (co-pay) of up to 20% of the service fee (after annual deductible is met). Patient agreed to services and consent obtained.  Patient Care Team: Plotnikov, [name], MD as PCP - General (Internal Medicine) Oren Binet, PhD as Consulting Physician (Psychology) Norma Fredrickson, MD as Consulting Physician (Psychiatry) Calvert Cantor, MD as Consulting Physician (Ophthalmology) Neldon Mc, Donnamarie Poag, MD as Consulting Physician (Allergy and Immunology) Alexis Frock, MD as Consulting Physician (Urology) Lafonda Mosses, MD as Consulting Physician (Gynecologic Oncology) Gery Pray, MD as Consulting Physician (Radiation Oncology) Mauri Pole, MD as Consulting Physician (Gastroenterology) Rosita Fire, MD as Consulting Physician (Nephrology) Melrose Nakayama, MD as Consulting Physician (Cardiothoracic Surgery) Sherlynn Stalls, MD as Consulting Physician (Ophthalmology) Delice Bison Darnelle Maffucci, [facility] (Pharmacist) Tomasa Blase, [facility] as Pharmacist (Pharmacist)  Recent office visits: 12/11/2020 - PCP visit - f/u for possible UTI, low BP - start ceftin for UTI, patient to follow up with oncology and to start PT  Recent consult visits: 12/31/2020 - Jeral Pinch MD Gyn/Onc - follow up after recent  wedge resection for recurrent oligometastatic LMS - patient to stop oral estrogen - has been ordered vaginal estrogen  - imaging  planned for 6 months, f/u in 3 months  12/26/2020 - Jeral Pinch MD Gyn/Onc - follow up after recent wedge resection for recurrent oligometastasic LMS - recovering well, noted persistent significant fatigue - CBC ordered   Hospital visits: 07/17/2020-07/19/2020 - admitted for resection of nodule of upper lobe of right long  06/16/2020 - video bronchoscopy   Objective:  Lab Results  Component Value Date   CREATININE 1.58 (H) 12/11/2020   BUN 33 (H) 12/11/2020   GFR 32.88 (L) 12/11/2020   GFRNONAA 43 (L) 07/19/2020   GFRAA 50 (L) 11/26/2019   NA 135 12/11/2020   K 3.7 12/11/2020   CALCIUM 10.2 12/11/2020   CO2 20 12/11/2020   GLUCOSE 111 (H) 12/11/2020    Lab Results  Component Value Date/Time   HGBA1C 5.5 06/27/2017 01:15 PM   HGBA1C 5.5 09/23/2008 04:50 PM   GFR 32.88 (L) 12/11/2020 02:54 PM   GFR 38.72 (L) 10/09/2020 03:43 PM    Last diabetic Eye exam:  No results found for: HMDIABEYEEXA  Last diabetic Foot exam:  No results found for: HMDIABFOOTEX   Lab Results  Component Value Date   CHOL 263 (H) 10/18/2018   HDL 45.40 10/18/2018   LDLCALC UNABLE TO CALCULATE IF TRIGLYCERIDE OVER 400 mg/dL 08/26/2017   LDLDIRECT 160.0 10/18/2018   TRIG 271.0 (H) 10/18/2018   CHOLHDL 6 10/18/2018    Hepatic Function Latest Ref Rng & Units 12/11/2020 10/09/2020 07/19/2020  Total Protein 6.0 - 8.3 g/dL 7.2 6.6 5.4(L)  Albumin 3.5 - 5.2 g/dL 4.1 3.8 3.0(L)  AST 0 - 37 U/L 24 17 48(H)  ALT 0 - 35 U/L 24 14 56(H)  Alk Phosphatase 39 - 117 U/L 120(H) 103 68  Total Bilirubin 0.2 - 1.2 mg/dL 0.3 0.2 0.6  Bilirubin, Direct 0.0 - 0.3 mg/dL - - -    Lab Results  Component Value Date/Time   TSH 1.26 04/14/2020 02:46 PM   TSH 1.20 08/27/2019 05:08 PM   FREET4 1.08 10/19/2018 04:28 PM   FREET4 1.05 10/15/2016 11:59 AM    CBC Latest Ref Rng & Units 12/11/2020 10/09/2020 08/25/2020  WBC 4.0 - 10.5 K/uL 11.8(H) 6.7 7.1  Hemoglobin 12.0 - 15.0 g/dL 12.4 11.5(L) 11.6(L)  Hematocrit 36.0 - 46.0  % 38.0 33.3(L) 35.0(L)  Platelets 150.0 - 400.0 K/uL 432.0(H) 400.0 376    Lab Results  Component Value Date/Time   VD25OH 53.01 06/27/2017 01:15 PM   VD25OH 69.02 12/29/2016 08:50 AM    Clinical ASCVD: No  The 10-year ASCVD risk score (Arnett DK, et al., 2019) is: 17.9%   Values used to calculate the score:     Age: 70 years     Sex: Female     Is Non-Hispanic African American: No     Diabetic: No     Tobacco smoker: No     Systolic Blood Pressure: 124 mmHg     Is BP treated: Yes     HDL Cholesterol: 45.4 mg/dL     Total Cholesterol: 263 mg/dL    Depression screen [facility] 2/9 12/09/2020 12/06/2019  Decreased Interest 1 0  Down, Depressed, Hopeless 0 0  PHQ - 2 Score 1 0  Altered sleeping 0 0  Tired, decreased energy 3 0  Change in appetite 0 0  Feeling bad or failure about yourself  1 0  Trouble concentrating 0 0  Moving slowly or fidgety/restless 0 0  Suicidal thoughts 0 0  PHQ-9 Score 5 0  Difficult doing work/chores Not difficult at all Not difficult at all  Some recent data might be hidden    Social History   Tobacco Use  Smoking Status Never  Smokeless Tobacco Never   BP Readings from Last 3 Encounters:  12/31/20 139/73  12/11/20 120/84  10/28/20 114/69   Pulse Readings from Last 3 Encounters:  12/31/20 76  12/11/20 100  10/28/20 79   Wt Readings from Last 3 Encounters:  12/31/20 161 lb 12.8 oz (73.4 kg)  12/11/20 160 lb 9.6 oz (72.8 kg)  10/28/20 160 lb (72.6 kg)   BMI Readings from Last 3 Encounters:  12/31/20 29.59 kg/m  12/11/20 29.37 kg/m  10/28/20 29.26 kg/m    Assessment/Interventions: Review of patient past medical history, allergies, medications, health status, including review of consultants reports, laboratory and other test data, was performed as part of comprehensive evaluation and provision of chronic care management services.   SDOH:  (Social Determinants of Health) assessments and interventions performed: Yes   SDOH Screenings    Alcohol Screen: Not on file  Depression (PHQ2-9): Medium Risk   PHQ-2 Score: 5  Financial Resource Strain: Not on file  Food Insecurity: Not on file  Housing: Not on file  Physical Activity: Not on file  Social Connections: Not on file  Stress: Not on file  Tobacco Use: Low Risk    Smoking Tobacco Use: Never   Smokeless Tobacco Use: Never  Transportation Needs: Not on file    [place]  Allergies  Allergen Reactions   Buprenorphine Hcl Other (See Comments)    Severe headache, confusion   Morphine And Related Other (See Comments)    Severe headache   Buprenorphine Other (See Comments)   Cephalexin Nausea And Vomiting and Nausea Only    Other reaction(s): Vomiting   Other Other (See Comments)    Trees & Grass = allergic symptoms   Codeine Other (See Comments)    Disorientation   Dust Mite Extract Cough   Molds & Smuts Cough    Medications Reviewed Today     Reviewed by Tomasa Blase, [facility] (Pharmacist) on 02/03/21 at [place] List Status: <None>   Medication Order Taking? Sig Documenting Provider Last Dose Status Informant  acetaminophen (TYLENOL) 325 MG tablet [id number] Yes Take 650 mg by mouth every 6 (six) hours as needed for moderate pain.  [provider] Taking Active Self  ALPRAZolam Duanne Moron) 0.5 MG tablet [id number] Yes Take 1 tablet (0.5 mg total) by mouth 2 (two) times daily as needed for anxiety.  Patient taking differently: Take 0.5 mg by mouth at bedtime as needed for anxiety.   Plotnikov, [name], MD Taking Active   aspirin EC 81 MG tablet [id number] Yes Take 81 mg by mouth daily. [provider] Taking Active Self  Bromelains (BROMELAIN PO) [id number] Yes Take 750 mg by mouth daily. [provider] Taking Active   busPIRone (BUSPAR) 30 MG tablet [id number] Yes Take 30 mg by mouth 2 (two) times daily. [provider] Taking Active Self  Cholecalciferol (VITAMIN D3) 125 MCG (5000 UT) CAPS [id number] Yes Take 1 capsule  by mouth daily. [provider] Taking Active   colesevelam [facility]) 625 MG tablet [id number] Yes Take 2 tablets (1,250 mg total) by mouth 2 (two) times daily with a meal. Plotnikov, [name], MD Taking Active Self  D-Mannose 500 MG CAPS [id number] Yes  Take 500 mg by mouth 2 (two) times daily. WITH CRANBERRY & DANDELION EXTRACT [provider] Taking Active Self  esomeprazole (NEXIUM) 40 MG capsule [id number] Yes Take 1 by mouth in the morning [provider] Taking Active   estradiol (ESTRACE VAGINAL) 0.1 MG/GM vaginal cream [id number] Yes Place 1 Applicatorful vaginally at bedtime. Place a small amount on your finger tip and insert int he vagina 3x a week at night Lafonda Mosses, MD Taking Active    Patient not taking:   Discontinued 11/05/19 0627 FERREX 150 150 MG capsule [id number] Yes TAKE 1 CAPSULE BY MOUTH TWICE A DAY Plotnikov, [name], MD Taking Active   FLUoxetine (PROZAC) 40 MG capsule [id number] Yes Take 1 capsule (40 mg total) by mouth every morning. Plotnikov, [name], MD Taking Active Self  furosemide (LASIX) 20 MG tablet [id number] Yes TAKE 1-2 TABLETS (20-40 MG TOTAL) BY MOUTH DAILY. Plotnikov, [name], MD Taking Active   ipratropium-albuterol (DUONEB) 0.5-2.5 (3) MG/3ML SOLN [id number] Yes Take 3 mLs by nebulization every 6 (six) hours as needed (Asthma). [provider] Taking Active   levothyroxine (SYNTHROID) 100 MCG tablet [id number] Yes TAKE 1 TABLET BY MOUTH EVERY DAY BEFORE BREAKFAST  Patient taking differently: Take 100 mcg by mouth daily before breakfast.   Plotnikov, [name], MD Taking Active   lisinopril (ZESTRIL) 20 MG tablet [id number] Yes TAKE 1 (ONE) TABLET IN THE EVENING Plotnikov, [name], MD Taking Active   loperamide (IMODIUM A-D) 2 MG tablet [id number] Yes Take 2 mg by mouth 4 (four) times daily as needed for diarrhea or loose stools. [provider] Taking Active   METAMUCIL FIBER PO [id number] Yes Take 1 capsule  by mouth in the morning and at bedtime. [provider] Taking Active   mirtazapine (REMERON) 30 MG tablet [id number] Yes Take 1 tablet (30 mg total) by mouth at bedtime. Plotnikov, [name], MD Taking Active Self  Multiple Vitamins-Minerals (MULTIVITAMIN WITH MINERALS) tablet [id number] Yes Take 1 tablet by mouth daily. Niel Hummer, NP Taking Active Self  Omega-3 Fatty Acids (FISH OIL) 1000 MG CPDR [id number] Yes Take 1,000 mg by mouth daily. [provider] Taking Active Self  OVER THE COUNTER MEDICATION [id number] Yes 7 MUSHROOM BLEND take 1 capsule every day [provider] Taking Active   Polyethyl Glycol-Propyl Glycol (SYSTANE OP) [id number] Yes Place 1 drop into both eyes 2 (two) times daily as needed (dry eyes).  [provider] Taking Active Self  progesterone (PROMETRIUM) 100 MG capsule [id number] Yes Take 100 mg by mouth at bedtime. [provider] Taking Active Self  Spirulina 500 MG TABS [id number] Yes Take 1 tablet by mouth daily. [provider] Taking Active             Patient Active Problem List   Diagnosis Date Noted   Atherosclerosis of aorta ([place]) 12/11/2020   Low BP 09/10/2020   Physical deconditioning 09/10/2020   Osteopenia 03/24/2020   Genital herpes simplex 03/24/2020   Chronic fatigue syndrome 03/24/2020   Bipolar I disorder ([place]) 03/24/2020   Wears glasses    Urgency of urination    Tuberculosis    Status post chemotherapy    S/P dilatation of esophageal stricture    Renal calculus, bilateral    Positive reaction to tuberculin skin test    Pneumonia    Mild persistent asthma    IDA (iron deficiency anemia)    Hx of transfusion of packed red blood cells    History  of syncope    History of positive PPD    History of kidney stones    History of DVT (deep vein thrombosis)    History of concussion    GERD (gastroesophageal reflux disease)    GAD (generalized anxiety disorder)    CKD (chronic kidney  disease), stage III (HCC)    Chronically dry eyes    Chronic cystitis    Chronic bronchitis (HCC)    Arthritis    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   Pelvic mass in female 09/20/2019   Ureteral stone with hydronephrosis 09/04/2019   Edema 08/27/2019   Bladder pain 08/2019   Dysuria 05/17/2019   Suspected 2019 novel coronavirus infection 04/25/2019   Urinary urgency 03/06/2019   Diarrhea 03/06/2019   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   Dyspnea on exertion 08/25/2017   Chest pain in adult 07/13/2017   HTN (hypertension) 05/31/2017   Anemia 05/31/2017   Leg pain, anterior, left 12/15/2016   Paresthesia 12/15/2016   Syncope 10/13/2016   Migraine headache 10/13/2016   Asthma 10/13/2016   Anxiety 10/13/2016   Hyperlipidemia 10/13/2016   UTI (urinary tract infection) 10/13/2016   Hypothyroidism 10/13/2016   Laryngopharyngeal reflux (LPR) 06/15/2016   Acute upper respiratory infection 05/05/2016   Degenerative disc disease, cervical 11/29/2015   IBS (irritable bowel syndrome) 11/24/2015   Facet arthritis of cervical region 10/16/2015   Chronic urinary tract infection 07/22/2015   Malaise and fatigue 04/16/2015   Severe episode of recurrent major depressive disorder, without psychotic features ([place]) 12/18/2014   Post concussion syndrome 10/28/2014   Brain syndrome, 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   Depression 10/25/2014   Major depressive disorder, recurrent, severe without psychotic features ([place])    Cervical nerve root disorder 09/18/2014   Concussion w/o coma 09/18/2014   Irritable bowel syndrome with diarrhea 09/12/2014   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   Cold intolerance 03/26/2014   Cough variant asthma  vs UACS/ irritable larynx  03/26/2014   Abdominal pain 03/26/2014   Positive skin test 03/26/2014   Adjustment disorder with anxiety 02/07/2014   Cervical pain 12/05/2013   Fibromyalgia 12/05/2013    Cephalalgia 12/05/2013   Adult hypothyroidism 12/05/2013   Adaptive colitis 12/05/2013   Menopause 12/05/2013   Mild cognitive disorder 12/05/2013   Feeling bilious 12/05/2013   Leiomyosarcoma ([place]) 09/10/2013   Lung mass 09/10/2013   Lung nodule, solitary 09/10/2013   Abnormal chest x-ray    Unspecified hypothyroidism 04/25/2013   Vitamin D deficiency 04/25/2013   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   Female genuine stress incontinence 04/12/2012   Healthcare maintenance 10/30/2010   MUSCLE PAIN 07/03/2008   BIPLR I MOST RECENT EPIS DPRS SEV NO PSYCHOT BHV 02/14/2008   DEEP PHLEBOTHROMBOSIS PP UNSPEC AS EPIS CARE 02/14/2008   Bipolar affective disorder, current episode depressed ([place]) 02/14/2008   Deep phlebothrombosis, postpartum 02/14/2008   HYPERLIPIDEMIA 03/04/2007   ALLERGIC RHINITIS 03/04/2007   GERD 03/04/2007   IRRITABLE BOWEL SYNDROME, HX OF 03/04/2007    Immunization History  Administered Date(s) Administered   Fluad Quad(high Dose 65+) 04/14/2020   H1N1 06/14/2008   Influenza Whole 02/14/2008, 02/17/2009, 03/17/2013   Influenza, High Dose Seasonal PF 04/02/2015, 05/31/2017, 03/16/2018, 12/27/2018   Influenza-Unspecified 03/24/2016   MMR 06/19/2009   PFIZER(Purple Top)SARS-COV-2 Vaccination 06/05/2019, 06/23/2019, 02/14/2020   Pneumococcal Conjugate-13 05/05/2016   Pneumococcal Polysaccharide-23 05/31/2017   Td 05/17/2000, 06/19/2009   Zoster, Live 04/16/2014    Conditions to be addressed/monitored:  Hypertension,  Hyperlipidemia, GERD, Chronic Kidney Disease, Depression, Anxiety, Osteopenia, IBS, Migraines, Chronic UTIs, and Hypothyroidism  Care Plan : CCM Care Plan  Updates made by Tomasa Blase, RPH since 02/03/2021 12:00 AM     Problem: Hypertension, Hyperlipidemia, GERD, Chronic Kidney Disease, Depression, Anxiety, Osteopenia, IBS, Migraines, Chronic UTIs, and Hypothyroidism   Priority: High  Onset Date: 12/09/2020     Long-Range Goal:  Disease Management   Start Date: 12/10/2020  Expected End Date: 06/12/2021  This Visit's Progress: On track  Recent Progress: On track  Priority: High  Note:   Current Barriers:  Unable to independently monitor therapeutic efficacy  Pharmacist Clinical Goal(s):  Patient will achieve adherence to monitoring guidelines and medication adherence to achieve therapeutic efficacy through collaboration with PharmD and provider.   Interventions: 1:1 collaboration with Plotnikov, [name], MD regarding development and update of comprehensive plan of care as evidenced by provider attestation and co-signature Inter-disciplinary care team collaboration (see longitudinal plan of care) Comprehensive medication review performed; medication list updated in electronic medical record  Hypertension (BP goal <130/80) -Controlled -Current treatment: Lisinopril 67m - 1 tablet daily  Furosemide 269m- 1-2 tablets daily -Medications previously tried: amlodipine, candesartan, carvedilol, hctz, nebivolol, telmisartan, verapamil, maxide  -Current home readings: averaging ~ 120/80 -Current dietary habits: follows a low sodium diet -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, 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, and walking 10 minutes twice daily  -Denies hypotensive/hypertensive symptoms -Educated on BP goals and benefits of medications for prevention of heart attack, stroke and kidney damage; Daily salt intake goal < 2300 mg; Exercise goal of 150 minutes per week; Importance of home blood pressure monitoring; Proper BP monitoring technique; Symptoms of hypotension and importance of maintaining adequate hydration; -Counseled to monitor BP at home daily, document, and provide log at future appointments -Counseled on diet and exercise extensively Recommended to continue current medication  Hyperlipidemia: (LDL goal < 130) -Not ideally controlled -Last LDL 160 mg/dL (10/18/2018) -Current treatment: Fish Oil  1,00028m 1 capsule daily  Aspirin 59m71m1 tablet daily  Colesevelam 625mg39m tablets twice daily  -Medications previously tried: n/a  -Current dietary patterns: reports to a diet low in fried/fatty foods  -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 PlovsCasimiro Needlellows every 3 months   -Controlled -Current treatment: Alprazolam 0.5mg -33mtablet nightly as needed  Buspirone 30mg -55mablet twice daily  Fluoxetine 40mg - 10mpsule daily  Mirtazepine 30mg - 146mlet at bedtime  -Medications previously tried/failed: abilify, wellbutrin, ctialopram, lithium, quetiapine, trazodone, venlafaxine, zolpidem, latuda -PHQ9: 5 -GAD7: 0 -Connected with Follows with Dr. GuttermanCheryln Manlyal health support every month -Educated on Benefits of medication for symptom control Benefits of cognitive-behavioral therapy with or without medication -Recommended to continue current medication  Asthma(Goal: control symptoms and prevent exacerbations) -Controlled -Current treatment  Ipratropium-Albuterol 0.5-2.5mg/3mL n97mlizer solution - 1 vial every 6 hours as needed  -Medications previously tried: albuterol HFA, asmanex, flovent, montelukast, arnuity   -Exacerbations requiring treatment in last 6  months: 0 -Frequency of rescue inhaler use: once daily when needed - notes to needing more on warmer days where she finds it tougher to breath -Counseled on [place] inhaler technique; When to use  rescue inhaler -Recommended to continue current medication   Hypothyroidism (Goal: Maintenance of euthyroid levels) -Controlled -Last TSH level 1.26uIU/mL (04/14/2020) -Current treatment  Levothyroxine 119mg - 1 tablet daily  -Medications previously tried: n/a  -Recommended to continue current medication  Iron Deficiency Anemia (Goal: Maintenance of appropriate Fe levels / prevention of associated symptoms) -Controlled -Last Iron, TIBC, and Ferritin levels - WNL (10/09/2020) -Current treatment  Ferrex 1555m- 1 capsule twice daily  -Medications previously tried: n/a  -Counseled on diet and exercise extensively Recommended to continue current medication  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 (Goal: prevention of UTI) -Controlled -Current treatment  D-Mannose 50088m/ cranberry and dandelion extract - 1 capsule twice daily  Estrace 0.18.7FI/EP1 applicatorful vaginally 3 times weekly  Progesterone 100m42m1 capsule daily  -Medications previously tried: estrace vaginal cream -Recommended to continue current medication  Chronic Migraines (Goal: Migraine symptoms control / treatment) -Controlled -Current treatment  Acetaminphen 325mg16mtablets twice daily  -Medications previously tried: sumatriptan (causes BP increase and worsening of HA)  -Recommended to continue current medication - Patient reports that she finds the most relief when she covers her eyes, uses ice on her head, takes acetaminophen and lies down with a migraine onset - rarely had to use ondansetron for nausea but has if needed    Osteopenia (Goal prevention of fractures) -Controlled -Last DEXA Scan: 08/08/2017   T-Score right femoral neck: -1.10  T-Score left femoral neck: -1.10  T-Score right total femur: -0.40  T-Score left total femur: -0.60  T-Score AP spine: +0.5  10-year probability of major osteoporotic fracture: 24%  10-year probability of hip fracture: 1.9% -Patient is a candidate  for pharmacologic treatment due to T-Score -1.0 to -2.5 and 10-year risk of major osteoporotic fracture > 20% -Bisphosphonate therapy cautioned in setting of current CKD  -CrCl: 42.915 mL/min  -Current treatment  Vitamin D 5000 units daily  -Medications previously tried: n/a  -Recommend [phone] units of vitamin D daily. Recommend 1200 mg of calcium daily from dietary and supplemental sources. Recommend weight-bearing and muscle strengthening exercises for building and maintaining bone density. -Counseled on diet and exercise extensively Recommended to continue current medication   GERD / IBS-D (Goal: Acid control / prevention of acid reflux / promotion of regular bowel movements) -Controlled -Current treatment  Colesevelam 625mg 69mtablets twice daily Esomeprazole 40mg -41mapsule daily   Metamucil - 1 capsule twice daily  Immodium  2mg - 161mblet 4 times daily as needed  -Medications previously tried: dicyclomine, lomotil, esomeprazole,   -Recommended to continue current medication  Chronic Kidney Disease (Goal: prevention of disease progression) -Controlled - stable  -Last eGFR 38.72 mL/min -Current treatment  Avoidance of nephrotoxic medications / adequate blood pressure control to prevent kidney damage   -Recommended to continue current medication  Health Maintenance -Vaccine gaps: shingles, tetanus booster, and COVID booster  -Current therapy:  Systane eye drops - 1 drop into each eye twice daily  Multivitamin - 1 tablet daily  Acetaminophen 325mg - 261mlets every 6 hours as needed  Metamucil - 1 capsule twice daily  Bromelain 750mg - 1 31mule daily  7 mushroom blend - 1 capsule daily  Spirulina 500mg - 1 t21mt daily  -Educated on Herbal supplement research is  limited and benefits usually cannot be proven Cost vs benefit of each product must be carefully weighed by individual consumer Supplements may interfere with prescription drugs -Patient is satisfied with  current therapy and denies issues -Recommended to continue current medication  Patient Goals/Self-Care Activities Patient will:  - take medications as prescribed check blood pressure daily, document, and provide at future appointments collaborate with provider on medication access solutions target a minimum of 150 minutes of moderate intensity exercise weekly        Medication Assistance: None required.  Patient affirms current coverage meets needs.  Care Gaps: Mammogram   Patient's preferred pharmacy is:  [id number] [id number]: [phone]: [phone]  Uses pill box? Yes Pt endorses 100% compliance  Care Plan and Follow Up Patient Decision:  Patient agrees to Care Plan and Follow-up.  Plan: Telephone follow up appointment with care management team member scheduled for:  3 months and The patient has been provided with contact information for the care management team and has been advised to call with any health related questions or concerns.   DTomasa Blase PharmD Clinical Pharmacist, LPeruscreening examination/treatment/procedure(s) were performed by non-physician practitioner and as supervising physician I was immediately available for consultation/collaboration.  I agree with above. ALew Dawes MD

## 2021-02-03 NOTE — Telephone Encounter (Signed)
Spoke with patient regarding history of multiple cancellations which have amounted to 7 same day cancellations since 8/4.  She reports she has had a tough time with Cancer treatment and the recent issues related to IBS. She states she feels prior to her scheduled appointment she is able to come, but then feels unwell resulting in her calling to cancel.   I noted I appreciated that she calls cancelling her appointment but considering it has been multiple cancellations that perhaps outpatient physical therapy isn't an appropriate approach at this time. The challenge being that its not just an evaluation but also the following visits that she would need to be able to attend in order to make functional progress.  I discussed that if making it to outpatient physical therapy presents as a challenge then she may be able to speak with her referring MD Dr Alain Marion and he may be able to send a referral to home health. Home health may be able to bridge the gap and help improve her function and progress to outpatient physical therapy.   I told her that at this point we will hold off scheduling outpatient PT,She agreed, and stated she would call her Dr to see if he would send a referral to [facility], DPT, LAT, ATC  02/03/21  3:22 PM

## 2021-02-03 NOTE — Patient Instructions (Signed)
Vedia Coffer,  It was great to talk to you today!  Please call me with any questions or concerns.  Visit Information  PATIENT GOALS:  Goals Addressed             This Visit's Progress    Patient Stated   On track    I want to continue to eat healthy, exercise by walking the track at the [facility].     Track and Manage My Symptoms-Depression   On track    Timeframe:  Long-Range Goal Priority:  High Start Date:   12/09/2020                          Expected End Date:  06/11/2021                     Follow Up Date 03/26/2021   - avoid negative self-talk - develop a personal safety plan - develop a plan to deal with triggers like holidays, anniversaries - exercise at least 2 to 3 times per week - have a plan for how to handle bad days - spend time or talk with others at least 2 to 3 times per week - watch for early signs of feeling worse    Why is this important?   Keeping track of your progress will help your treatment team find the right mix of medicine and therapy for you.  Write in your journal every day.  Day-to-day changes in depression symptoms are normal. It may be more helpful to check your progress at the end of each week instead of every day.          Patient verbalizes understanding of instructions provided today and agrees to view in [place].   Telephone follow up appointment with care management team member scheduled for: 3 months The patient has been provided with contact information for the care management team and has been advised to call with any health related questions or concerns.   Tomasa Blase, PharmD Clinical Pharmacist, [place]

## 2021-02-04 ENCOUNTER — Ambulatory Visit (INDEPENDENT_AMBULATORY_CARE_PROVIDER_SITE_OTHER): Payer: Medicare Other | Admitting: Psychology

## 2021-02-04 ENCOUNTER — Other Ambulatory Visit: Payer: Self-pay

## 2021-02-04 DIAGNOSIS — F4321 Adjustment disorder with depressed mood: Secondary | ICD-10-CM

## 2021-02-09 ENCOUNTER — Telehealth: Payer: Self-pay

## 2021-02-09 ENCOUNTER — Ambulatory Visit: Payer: Medicare Other | Admitting: Psychology

## 2021-02-09 NOTE — Progress Notes (Signed)
    Chronic Care Management Pharmacy Assistant   Name: DEANNE BEDGOOD  MRN: [id number] DOB: March 03, 1951  Opened in error

## 2021-02-10 ENCOUNTER — Ambulatory Visit: Payer: Medicare Other | Admitting: Cardiology

## 2021-02-13 DIAGNOSIS — I10 Essential (primary) hypertension: Secondary | ICD-10-CM

## 2021-02-13 DIAGNOSIS — E039 Hypothyroidism, unspecified: Secondary | ICD-10-CM

## 2021-02-17 ENCOUNTER — Telehealth: Payer: Self-pay | Admitting: Internal Medicine

## 2021-02-17 NOTE — Telephone Encounter (Signed)
Patient says she would like for new referral to be put in for PT but she would like to do the PT at home  Please call patient [phone]

## 2021-02-18 ENCOUNTER — Ambulatory Visit: Payer: Medicare Other | Admitting: Psychology

## 2021-02-19 ENCOUNTER — Ambulatory Visit: Payer: Medicare Other | Admitting: Internal Medicine

## 2021-02-20 ENCOUNTER — Ambulatory Visit: Payer: Medicare Other | Admitting: Nurse Practitioner

## 2021-02-20 NOTE — Telephone Encounter (Signed)
To qualify her for home PT [name] has to be homebound and see me within 30 days of the referral date in the office or virtually.  Thanks,

## 2021-02-20 NOTE — Telephone Encounter (Signed)
Called pt there was no answer LMOM w/MD response../lmb 

## 2021-02-23 ENCOUNTER — Ambulatory Visit: Payer: Medicare Other | Admitting: Emergency Medicine

## 2021-02-23 ENCOUNTER — Ambulatory Visit (INDEPENDENT_AMBULATORY_CARE_PROVIDER_SITE_OTHER): Payer: Medicare Other | Admitting: Psychology

## 2021-02-23 ENCOUNTER — Telehealth: Payer: Self-pay

## 2021-02-23 DIAGNOSIS — F4321 Adjustment disorder with depressed mood: Secondary | ICD-10-CM

## 2021-02-23 NOTE — Chronic Care Management (AMB) (Signed)
Chronic Care Management Pharmacy Assistant   Name: [name]  MRN: [id number] DOB: 1950/08/25  Reason for Encounter: Disease State Hypertension    Recent office visits:  None noted   Recent consult visits:  None noted   Hospital visits:  None in previous 6 months  Medications: Outpatient Encounter Medications as of 02/23/2021  Medication Sig   acetaminophen (TYLENOL) 325 MG tablet Take 650 mg by mouth every 6 (six) hours as needed for moderate pain.    ALPRAZolam (XANAX) 0.5 MG tablet Take 1 tablet (0.5 mg total) by mouth 2 (two) times daily as needed for anxiety. (Patient taking differently: Take 0.5 mg by mouth at bedtime as needed for anxiety.)   aspirin EC 81 MG tablet Take 81 mg by mouth daily.   Bromelains (BROMELAIN PO) Take 750 mg by mouth daily.   busPIRone (BUSPAR) 30 MG tablet Take 30 mg by mouth 2 (two) times daily.   Cholecalciferol (VITAMIN D3) 125 MCG (5000 UT) CAPS Take 1 capsule by mouth daily.   colesevelam (WELCHOL) 625 MG tablet Take 2 tablets (1,250 mg total) by mouth 2 (two) times daily with a meal.   D-Mannose 500 MG CAPS Take 500 mg by mouth 2 (two) times daily. WITH CRANBERRY & DANDELION EXTRACT   esomeprazole (NEXIUM) 40 MG capsule Take 1 by mouth in the morning   estradiol (ESTRACE VAGINAL) 0.1 MG/GM vaginal cream Place 1 Applicatorful vaginally at bedtime. Place a small amount on your finger tip and insert int he vagina 3x a week at night   FERREX 150 150 MG capsule TAKE 1 CAPSULE BY MOUTH TWICE A DAY   FLUoxetine (PROZAC) 40 MG capsule Take 1 capsule (40 mg total) by mouth every morning.   furosemide (LASIX) 20 MG tablet TAKE 1-2 TABLETS (20-40 MG TOTAL) BY MOUTH DAILY.   ipratropium-albuterol (DUONEB) 0.5-2.5 (3) MG/3ML SOLN Take 3 mLs by nebulization every 6 (six) hours as needed (Asthma).   levothyroxine (SYNTHROID) 100 MCG tablet TAKE 1 TABLET BY MOUTH EVERY DAY BEFORE BREAKFAST (Patient taking differently: Take 100 mcg by mouth daily  before breakfast.)   lisinopril (ZESTRIL) 20 MG tablet TAKE 1 (ONE) TABLET IN THE EVENING   loperamide (IMODIUM A-D) 2 MG tablet Take 2 mg by mouth 4 (four) times daily as needed for diarrhea or loose stools.   METAMUCIL FIBER PO Take 1 capsule by mouth in the morning and at bedtime.   mirtazapine (REMERON) 30 MG tablet Take 1 tablet (30 mg total) by mouth at bedtime.   Multiple Vitamins-Minerals (MULTIVITAMIN WITH MINERALS) tablet Take 1 tablet by mouth daily.   Omega-3 Fatty Acids (FISH OIL) 1000 MG CPDR Take 1,000 mg by mouth daily.   OVER THE COUNTER MEDICATION 7 MUSHROOM BLEND take 1 capsule every day   Polyethyl Glycol-Propyl Glycol (SYSTANE OP) Place 1 drop into both eyes 2 (two) times daily as needed (dry eyes).    progesterone (PROMETRIUM) 100 MG capsule Take 100 mg by mouth at bedtime.   Spirulina 500 MG TABS Take 1 tablet by mouth daily.   [DISCONTINUED] famotidine (PEPCID) 40 MG tablet TAKE 1 TABLET BY MOUTH EVERY DAY IN THE EVENING (Patient not taking: No sig reported)   No facility-administered encounter medications on file as of 02/23/2021.    Care Gaps: Zoster Vaccines- Shingrix Overdue - never done  MAMMOGRAM Last completed: Jun 15, 2010 TETANUS/TDAP Last completed: Jun 19, 2009 COVID-19 Vaccine (4 - Booster for [name] series) Last completed: Feb 14, 2020 INFLUENZA  VACCINE Last completed: Apr 14, 2020   Reviewed chart prior to disease state call. Spoke with patient regarding BP  Recent Office Vitals: BP Readings from Last 3 Encounters:  12/31/20 139/73  12/11/20 120/84  10/28/20 114/69   Pulse Readings from Last 3 Encounters:  12/31/20 76  12/11/20 100  10/28/20 79    Wt Readings from Last 3 Encounters:  12/31/20 161 lb 12.8 oz (73.4 kg)  12/11/20 160 lb 9.6 oz (72.8 kg)  10/28/20 160 lb (72.6 kg)     Kidney Function Lab Results  Component Value Date/Time   CREATININE 1.58 (H) 12/11/2020 02:54 PM   CREATININE 1.38 (H) 10/09/2020 03:43 PM   CREATININE  1.0 08/11/2015 01:43 PM   GFR 32.88 (L) 12/11/2020 02:54 PM   GFRNONAA 43 (L) 07/19/2020 01:12 AM   GFRAA 50 (L) 11/26/2019 01:50 PM    BMP Latest Ref Rng & Units 12/11/2020 10/09/2020 07/19/2020  Glucose 70 - 99 mg/dL 111(H) 88 90  BUN 6 - 23 mg/dL 33(H) 25(H) 23  Creatinine 0.40 - 1.20 mg/dL 1.58(H) 1.38(H) 1.34(H)  BUN/Creat Ratio 12 - 28 - - -  Sodium 135 - 145 mEq/L 135 137 137  Potassium 3.5 - 5.1 mEq/L 3.7 3.9 4.1  Chloride 96 - 112 mEq/L 103 103 108  CO2 19 - 32 mEq/L 20 25 21(L)  Calcium 8.4 - 10.5 mg/dL 10.2 9.7 9.6    Current antihypertensive regimen:  Lisinopril 20mg  - 1 tablet daily  Furosemide 20mg  - 1-2 tablets daily How often are you checking your Blood Pressure? daily Current home BP readings: 02/23/21 @124 /82 Patient states that they have been within a similar range this week.  What recent interventions/DTPs have been made by any provider to improve Blood Pressure control since last CPP Visit: N/a Any recent hospitalizations or ED visits since last visit with CPP? No What diet changes have been made to improve Blood Pressure Control?  N/a  What exercise is being done to improve your Blood Pressure Control?  Patient states that she is able to move around a lot more lately, She was not able to do so before due to her frequent bathroom breaks.   Adherence Review: Is the patient currently on ACE/ARB medication? Yes Does the patient have >5 day gap between last estimated fill dates? No  Misc. Comment: [name] states that she is doing well. She states that she is able to drive and go out a little more than she did last month. Overall patient states that she has no problems with her medications, or any concerns with her health at this time.    Star Rating Drugs: Lisinopril 20 mg last fill 02/02/21 90 DS   Andee Poles, CMA

## 2021-02-25 DIAGNOSIS — Z1231 Encounter for screening mammogram for malignant neoplasm of breast: Secondary | ICD-10-CM | POA: Diagnosis not present

## 2021-02-25 DIAGNOSIS — N390 Urinary tract infection, site not specified: Secondary | ICD-10-CM | POA: Diagnosis not present

## 2021-02-25 DIAGNOSIS — R309 Painful micturition, unspecified: Secondary | ICD-10-CM | POA: Diagnosis not present

## 2021-02-26 ENCOUNTER — Ambulatory Visit (HOSPITAL_COMMUNITY)
Admission: RE | Admit: 2021-02-26 | Discharge: 2021-02-26 | Disposition: A | Discharge status: Home / Self Care | Payer: Medicare Other | Source: Ambulatory Visit | Attending: Gynecologic Oncology | Admitting: Gynecologic Oncology

## 2021-02-26 ENCOUNTER — Other Ambulatory Visit: Payer: Self-pay

## 2021-02-26 ENCOUNTER — Inpatient Hospital Stay: Payer: Medicare Other | Attending: Gynecologic Oncology

## 2021-02-26 ENCOUNTER — Other Ambulatory Visit: Payer: Self-pay | Admitting: Gynecologic Oncology

## 2021-02-26 ENCOUNTER — Telehealth: Payer: Self-pay

## 2021-02-26 DIAGNOSIS — R1084 Generalized abdominal pain: Secondary | ICD-10-CM

## 2021-02-26 DIAGNOSIS — I700 Atherosclerosis of aorta: Secondary | ICD-10-CM | POA: Diagnosis not present

## 2021-02-26 DIAGNOSIS — R140 Abdominal distension (gaseous): Secondary | ICD-10-CM

## 2021-02-26 DIAGNOSIS — R34 Anuria and oliguria: Secondary | ICD-10-CM | POA: Diagnosis not present

## 2021-02-26 DIAGNOSIS — R110 Nausea: Secondary | ICD-10-CM | POA: Diagnosis not present

## 2021-02-26 DIAGNOSIS — Z8542 Personal history of malignant neoplasm of other parts of uterus: Secondary | ICD-10-CM | POA: Insufficient documentation

## 2021-02-26 DIAGNOSIS — C55 Malignant neoplasm of uterus, part unspecified: Secondary | ICD-10-CM

## 2021-02-26 DIAGNOSIS — Z9049 Acquired absence of other specified parts of digestive tract: Secondary | ICD-10-CM | POA: Diagnosis not present

## 2021-02-26 DIAGNOSIS — N390 Urinary tract infection, site not specified: Secondary | ICD-10-CM | POA: Insufficient documentation

## 2021-02-26 DIAGNOSIS — R109 Unspecified abdominal pain: Secondary | ICD-10-CM | POA: Diagnosis not present

## 2021-02-26 LAB — BASIC METABOLIC PANEL - CANCER CENTER ONLY
Anion gap: 9 (ref 5–15)
BUN: 41 mg/dL — ABNORMAL HIGH (ref 8–23)
CO2: 27 mmol/L (ref 22–32)
Calcium: 10.6 mg/dL — ABNORMAL HIGH (ref 8.9–10.3)
Chloride: 99 mmol/L (ref 98–111)
Creatinine: 1.56 mg/dL — ABNORMAL HIGH (ref 0.44–1.00)
GFR, Estimated: 35 mL/min — ABNORMAL LOW (ref >60)
Glucose, Bld: 104 mg/dL — ABNORMAL HIGH (ref 70–99)
Potassium: 3.5 mmol/L (ref 3.5–5.1)
Sodium: 135 mmol/L (ref 135–145)

## 2021-02-26 MED ORDER — IOHEXOL 9 MG/ML PO SOLN: 1000.0000 mL | ORAL | Status: AC

## 2021-02-26 MED ORDER — IOHEXOL 350 MG/ML SOLN
75.0000 mL | Freq: Once | INTRAVENOUS | Status: AC | PRN
  Administered 2021-02-26: 75 mL via INTRAVENOUS

## 2021-02-26 NOTE — Telephone Encounter (Addendum)
Ms Viviani states that she has been having abdominal pain ~4 days, H/A, dysuria. Saw saw her gyn yesterday Dr. Matthew Saras for dysuria.  He began her on cipro. Over night her upper and lower abdomen is distended, hard, and painful.  She has been drinking fluids ~every hour.  Her Urinary output has decreased today. She is nauseous. She is hearing some high pitched sounds in her abdomen. She is SOB with exertion.  Feeling very weak. Information reviewed with Joylene John, NP.

## 2021-02-26 NOTE — Telephone Encounter (Signed)
[name] Stated that Dr. Berline Lopes wants to get a Stat CT A/P. Sent scan for prior authorization.

## 2021-02-26 NOTE — Telephone Encounter (Signed)
Spoke with [name]. Told her that she needed to come to the [facility] ~2:45 pm for stat b-met. Then proceed to admitting to register for the CT scan.   She has not eaten today. Last had water at 1 pm. NPO since 1 pm. She can ask for the water based contrast which may be easier to drink. Pt verbalized understanding.

## 2021-02-26 NOTE — Telephone Encounter (Signed)
Received Prior Authorization.Marland Kitchen

## 2021-02-26 NOTE — Progress Notes (Signed)
See RN note. Patient has history of recurrent LMS. She called the office with new symptoms of abdominal swelling/pain, nausea, decreased urine flow, headache. She was recently started on Cipro for a UTI. Plan for CT scan to evaluate for cause of symptoms and for signs of recurrence.

## 2021-02-27 ENCOUNTER — Telehealth: Payer: Self-pay

## 2021-02-27 NOTE — Telephone Encounter (Signed)
Left message requesting return call

## 2021-02-27 NOTE — Telephone Encounter (Signed)
Received call from [facility], reviewed BMP results per Joylene John, NP These results were overall stable.  CT scan showed no acute findings, possible bladder infection, and aortic atherosclerosis.  Patient reports going to PCP for bladder infection and receiving Cipro.  She reports some nausea and discomfort but overall feeling better. Instructed to call with any questions or concerns.  Patient verbalized understanding.

## 2021-03-03 DIAGNOSIS — H04123 Dry eye syndrome of bilateral lacrimal glands: Secondary | ICD-10-CM | POA: Diagnosis not present

## 2021-03-03 DIAGNOSIS — H43393 Other vitreous opacities, bilateral: Secondary | ICD-10-CM | POA: Diagnosis not present

## 2021-03-03 DIAGNOSIS — H33322 Round hole, left eye: Secondary | ICD-10-CM | POA: Diagnosis not present

## 2021-03-03 DIAGNOSIS — H35373 Puckering of macula, bilateral: Secondary | ICD-10-CM | POA: Diagnosis not present

## 2021-03-04 ENCOUNTER — Ambulatory Visit (INDEPENDENT_AMBULATORY_CARE_PROVIDER_SITE_OTHER): Payer: Medicare Other | Admitting: Psychology

## 2021-03-04 DIAGNOSIS — F4321 Adjustment disorder with depressed mood: Secondary | ICD-10-CM

## 2021-03-09 ENCOUNTER — Other Ambulatory Visit: Payer: Self-pay

## 2021-03-09 ENCOUNTER — Encounter: Payer: Self-pay | Admitting: Internal Medicine

## 2021-03-09 ENCOUNTER — Ambulatory Visit (INDEPENDENT_AMBULATORY_CARE_PROVIDER_SITE_OTHER): Payer: Medicare Other | Admitting: Internal Medicine

## 2021-03-09 ENCOUNTER — Ambulatory Visit: Payer: Medicare Other | Admitting: Psychology

## 2021-03-09 VITALS — BP 122/80 | HR 77 | Resp 18 | Ht 62.0 in | Wt 162.4 lb

## 2021-03-09 DIAGNOSIS — R3989 Other symptoms and signs involving the genitourinary system: Secondary | ICD-10-CM | POA: Diagnosis not present

## 2021-03-09 LAB — POCT URINALYSIS DIPSTICK
Bilirubin, UA: NEGATIVE
Blood, UA: NEGATIVE
Glucose, UA: NEGATIVE
Ketones, UA: NEGATIVE
Leukocytes, UA: NEGATIVE
Nitrite, UA: NEGATIVE
Protein, UA: NEGATIVE
Spec Grav, UA: 1.015 (ref 1.010–1.025)
Urobilinogen, UA: 0.2 E.U./dL
pH, UA: 6 (ref 5.0–8.0)

## 2021-03-09 MED ORDER — SULFAMETHOXAZOLE-TRIMETHOPRIM 800-160 MG PO TABS: 1.0000 | ORAL_TABLET | Freq: Two times a day (BID) | ORAL | 0 refills | Status: AC

## 2021-03-09 NOTE — Assessment & Plan Note (Signed)
U/A done fairly normal but just stopped antibiotics and home testing consistent with UTI. She has recurrent UTI and just finished 1 week cipro. Allergy to pcn and cephalosporins. Rx bactrim 2 week course and ordered urine culture.

## 2021-03-09 NOTE — Patient Instructions (Signed)
We have sent in the bactrim to take 1 pill twice a day for 2 weeks. We will check a culture.

## 2021-03-09 NOTE — Progress Notes (Signed)
   Subjective:   Patient ID: [name], female    DOB: 1950/08/18, 69 y.o.   MRN: [id number]  Urinary Tract Infection  Associated symptoms include frequency. Pertinent negatives include no nausea, urgency or vomiting.  The patient is a 70 YO female coming in for possible UTI. Recent CT scan 02/27/21 with possible infection. Recently treated for infection with cipro which helped but then symptoms started again 2 days ago.  Review of Systems  Constitutional:  Positive for fatigue.  Respiratory: Negative.    Cardiovascular: Negative.   Gastrointestinal:  Negative for abdominal distention, abdominal pain, constipation, diarrhea, nausea and vomiting.  Genitourinary:  Positive for dysuria and frequency. Negative for urgency.  Musculoskeletal: Negative.   Skin: Negative.    Objective:  Physical Exam Constitutional:      Appearance: She is well-developed.  HENT:     Head: Normocephalic and atraumatic.  Cardiovascular:     Rate and Rhythm: Normal rate and regular rhythm.  Pulmonary:     Effort: Pulmonary effort is normal. No respiratory distress.     Breath sounds: Normal breath sounds. No wheezing or rales.  Abdominal:     General: Bowel sounds are normal. There is no distension.     Palpations: Abdomen is soft.     Tenderness: There is no abdominal tenderness. There is no rebound.  Musculoskeletal:     Cervical back: Normal range of motion.  Skin:    General: Skin is warm and dry.  Neurological:     Mental Status: She is alert and oriented to person, place, and time.     Coordination: Coordination normal.    Vitals:   03/09/21 1114  BP: 122/80  Pulse: 77  Resp: 18  SpO2: 98%  Weight: 162 lb 6.4 oz (73.7 kg)  Height: 5\' 2"  (1.575 m)    This visit occurred during the SARS-CoV-2 public health emergency.  Safety protocols were in place, including screening questions prior to the visit, additional usage of staff PPE, and extensive cleaning of exam room while observing  appropriate contact time as indicated for disinfecting solutions.   Assessment & Plan:

## 2021-03-09 NOTE — Telephone Encounter (Signed)
Patient states home urine test done on 03-09-2021 is positive   Patient states urine lab test results ordered today 03-09-2021 read negative on mychart  Patient is requesting a call back

## 2021-03-12 ENCOUNTER — Encounter: Payer: Self-pay | Admitting: Internal Medicine

## 2021-03-12 LAB — URINE CULTURE

## 2021-03-18 ENCOUNTER — Ambulatory Visit (INDEPENDENT_AMBULATORY_CARE_PROVIDER_SITE_OTHER): Payer: Medicare Other | Admitting: Psychology

## 2021-03-18 ENCOUNTER — Telehealth: Payer: Self-pay | Admitting: *Deleted

## 2021-03-18 DIAGNOSIS — F4321 Adjustment disorder with depressed mood: Secondary | ICD-10-CM

## 2021-03-18 NOTE — Telephone Encounter (Signed)
Rescheduled appt from 11/16 to 11/21

## 2021-03-19 ENCOUNTER — Ambulatory Visit: Payer: Medicare Other | Admitting: Internal Medicine

## 2021-03-23 ENCOUNTER — Ambulatory Visit (INDEPENDENT_AMBULATORY_CARE_PROVIDER_SITE_OTHER): Payer: Medicare Other | Admitting: Psychology

## 2021-03-23 ENCOUNTER — Other Ambulatory Visit: Payer: Self-pay | Admitting: Internal Medicine

## 2021-03-23 DIAGNOSIS — F4321 Adjustment disorder with depressed mood: Secondary | ICD-10-CM | POA: Diagnosis not present

## 2021-03-30 ENCOUNTER — Telehealth: Payer: Self-pay

## 2021-03-30 NOTE — Progress Notes (Signed)
Chronic Care Management Pharmacy Assistant   Name: [name]  MRN: [id number] DOB: Feb 07, 1951    Reason for Encounter: Disease State   Conditions to be addressed/monitored: General Adherence   Recent office visits:  03/09/21 Hoyt Koch, MD-Internal Medicine (UTI) ordered urine culture, med change:sulfamethoxazole-trimethoprim 800-160 mg  Recent consult visits:  None ID  Hospital visits:  None in previous 6 months  Medications: Outpatient Encounter Medications as of 03/30/2021  Medication Sig   acetaminophen (TYLENOL) 325 MG tablet Take 650 mg by mouth every 6 (six) hours as needed for moderate pain.    ALPRAZolam (XANAX) 0.5 MG tablet Take 1 tablet (0.5 mg total) by mouth 2 (two) times daily as needed for anxiety. (Patient taking differently: Take 0.5 mg by mouth at bedtime as needed for anxiety.)   aspirin EC 81 MG tablet Take 81 mg by mouth daily.   Bromelains (BROMELAIN PO) Take 750 mg by mouth daily.   busPIRone (BUSPAR) 30 MG tablet Take 30 mg by mouth 2 (two) times daily.   Cholecalciferol (VITAMIN D3) 125 MCG (5000 UT) CAPS Take 1 capsule by mouth daily.   colesevelam (WELCHOL) 625 MG tablet Take 2 tablets (1,250 mg total) by mouth 2 (two) times daily with a meal.   D-Mannose 500 MG CAPS Take 500 mg by mouth 2 (two) times daily. WITH CRANBERRY & DANDELION EXTRACT   esomeprazole (NEXIUM) 40 MG capsule Take 1 by mouth in the morning   estradiol (ESTRACE VAGINAL) 0.1 MG/GM vaginal cream Place 1 Applicatorful vaginally at bedtime. Place a small amount on your finger tip and insert int he vagina 3x a week at night   FERREX 150 150 MG capsule TAKE 1 CAPSULE BY MOUTH TWICE A DAY   FLUoxetine (PROZAC) 40 MG capsule Take 1 capsule (40 mg total) by mouth every morning.   furosemide (LASIX) 20 MG tablet TAKE 1-2 TABLETS (20-40 MG TOTAL) BY MOUTH DAILY.   ipratropium-albuterol (DUONEB) 0.5-2.5 (3) MG/3ML SOLN Take 3 mLs by nebulization every 6 (six) hours as  needed (Asthma).   levothyroxine (SYNTHROID) 100 MCG tablet Take 1 tablet (100 mcg total) by mouth daily before breakfast. Annual appt w/labs are due. Most see provider for future refills   lisinopril (ZESTRIL) 20 MG tablet TAKE 1 (ONE) TABLET IN THE EVENING   loperamide (IMODIUM A-D) 2 MG tablet Take 2 mg by mouth 4 (four) times daily as needed for diarrhea or loose stools.   METAMUCIL FIBER PO Take 1 capsule by mouth in the morning and at bedtime.   mirtazapine (REMERON) 30 MG tablet Take 1 tablet (30 mg total) by mouth at bedtime.   Multiple Vitamins-Minerals (MULTIVITAMIN WITH MINERALS) tablet Take 1 tablet by mouth daily.   Omega-3 Fatty Acids (FISH OIL) 1000 MG CPDR Take 1,000 mg by mouth daily.   OVER THE COUNTER MEDICATION 7 MUSHROOM BLEND take 1 capsule every day   Polyethyl Glycol-Propyl Glycol (SYSTANE OP) Place 1 drop into both eyes 2 (two) times daily as needed (dry eyes).    progesterone (PROMETRIUM) 100 MG capsule Take 100 mg by mouth at bedtime.   Spirulina 500 MG TABS Take 1 tablet by mouth daily.   [DISCONTINUED] famotidine (PEPCID) 40 MG tablet TAKE 1 TABLET BY MOUTH EVERY DAY IN THE EVENING (Patient not taking: No sig reported)   No facility-administered encounter medications on file as of 03/30/2021.   Have you had any problems recently with your health?Patient states that she does not have any new  health issues   Have you had any problems with your pharmacy?Patient states that she does not have any problems with getting medications or the cost of medications from the pharmacy  What issues or side effects are you having with your medications?Patient states that she does not have any side effects from medications  What would you like me to pass along to [facility] for them to help you with? Patient states that she is doing well  What can we do to take care of you better? Patient states nothing at this time but appreciates the call  Care  Gaps: Colonoscopy-05/12/16 Diabetic Foot Exam-NA Mammogram-06/15/10 Ophthalmology-NA Dexa Scan -NA  Annual Well Visit -  Micro albumin- Hemoglobin A1c-   Star Rating Drugs: Lisinopril 20 mg-last fill 02/02/21 90 ds  [place] Pharmacist Assistant [phone]

## 2021-03-31 ENCOUNTER — Ambulatory Visit: Payer: Medicare Other | Admitting: Gynecologic Oncology

## 2021-03-31 ENCOUNTER — Inpatient Hospital Stay: Payer: Medicare Other | Admitting: Gynecologic Oncology

## 2021-04-01 ENCOUNTER — Ambulatory Visit (INDEPENDENT_AMBULATORY_CARE_PROVIDER_SITE_OTHER): Payer: Medicare Other | Admitting: Psychology

## 2021-04-01 ENCOUNTER — Ambulatory Visit: Payer: Medicare Other | Admitting: Internal Medicine

## 2021-04-01 DIAGNOSIS — F4321 Adjustment disorder with depressed mood: Secondary | ICD-10-CM | POA: Diagnosis not present

## 2021-04-06 ENCOUNTER — Ambulatory Visit: Payer: Medicare Other | Admitting: Psychology

## 2021-04-06 ENCOUNTER — Encounter: Payer: Self-pay | Admitting: Gynecologic Oncology

## 2021-04-06 ENCOUNTER — Telehealth: Payer: Self-pay

## 2021-04-06 ENCOUNTER — Inpatient Hospital Stay: Payer: Medicare Other | Admitting: Gynecologic Oncology

## 2021-04-06 DIAGNOSIS — C55 Malignant neoplasm of uterus, part unspecified: Secondary | ICD-10-CM

## 2021-04-06 NOTE — Telephone Encounter (Signed)
Received call from patient stating she has a bad cold and needs to reschedule appointment with Dr. Berline Lopes.  Rescheduled patient to Wednesday 04/15/2021 at 2 pm.

## 2021-04-07 ENCOUNTER — Ambulatory Visit: Payer: Medicare Other | Admitting: Internal Medicine

## 2021-04-13 ENCOUNTER — Encounter: Payer: Self-pay | Admitting: Gynecologic Oncology

## 2021-04-15 ENCOUNTER — Telehealth: Payer: Self-pay | Admitting: *Deleted

## 2021-04-15 ENCOUNTER — Ambulatory Visit: Payer: Medicare Other | Admitting: Psychology

## 2021-04-15 ENCOUNTER — Inpatient Hospital Stay: Payer: Medicare Other | Admitting: Gynecologic Oncology

## 2021-04-15 DIAGNOSIS — C55 Malignant neoplasm of uterus, part unspecified: Secondary | ICD-10-CM

## 2021-04-15 NOTE — Telephone Encounter (Signed)
Patient called and moved her appt from 11/30 to 12/9 due to being sick
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
## 2021-04-16 ENCOUNTER — Ambulatory Visit (INDEPENDENT_AMBULATORY_CARE_PROVIDER_SITE_OTHER): Payer: Medicare Other | Admitting: Psychology

## 2021-04-16 DIAGNOSIS — F4321 Adjustment disorder with depressed mood: Secondary | ICD-10-CM | POA: Diagnosis not present

## 2021-04-17 ENCOUNTER — Ambulatory Visit: Payer: Medicare Other | Admitting: Internal Medicine

## 2021-04-20 ENCOUNTER — Ambulatory Visit: Payer: Medicare Other | Admitting: Psychology

## 2021-04-22 ENCOUNTER — Ambulatory Visit: Payer: Medicare Other | Admitting: Internal Medicine

## 2021-04-23 NOTE — Progress Notes (Signed)
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 Specifically, no findings to explain the patient's history of abdominal distension. 2. Trace amount a gas in the bladder lumen is presumably secondary to recent instrumentation. In the absence of recent instrumentation, bladder infection would be a distinct consideration. 3. Aortic Atherosclerosis (ICD10-I70.0).  Assessment & Plan: [name] is a 70 y.o. 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   Patient is NED on exam today.  She continues to have fatigue and recurrent urinary tract infections.  We have previously discussed trying vaginal estrogen.  She has a prescription for this but has not started it.  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  She is continued on progesterone.   We will continue with visits every 3 months at this time.  Plan had been to repeat imaging 6 months from her last visit with me.  Given symptoms, she had imaging performed in October.  We will tentatively plan to repeat imaging in March unless she develops new symptoms.  Encouraged  her to be out of the house and increase her activity as tolerated as I think this will help with her fatigue and deconditioning.  38 minutes of total time was spent for this patient encounter, including preparation, face-to-face counseling with the patient and coordination of care, and documentation of the encounter.  Jeral Pinch, MD  Division of Gynecologic Oncology  Department of Obstetrics and Gynecology  [facility]

## 2021-04-24 ENCOUNTER — Inpatient Hospital Stay: Payer: Medicare Other | Attending: Gynecologic Oncology | Admitting: Gynecologic Oncology

## 2021-04-24 ENCOUNTER — Encounter: Payer: Self-pay | Admitting: Gynecologic Oncology

## 2021-04-24 ENCOUNTER — Other Ambulatory Visit: Payer: Self-pay

## 2021-04-24 VITALS — BP 125/76 | HR 97 | Temp 98.2°F | Resp 16 | Ht 62.0 in | Wt 161.0 lb

## 2021-04-24 DIAGNOSIS — Z79899 Other long term (current) drug therapy: Secondary | ICD-10-CM | POA: Insufficient documentation

## 2021-04-24 DIAGNOSIS — Z90722 Acquired absence of ovaries, bilateral: Secondary | ICD-10-CM | POA: Diagnosis not present

## 2021-04-24 DIAGNOSIS — Z86718 Personal history of other venous thrombosis and embolism: Secondary | ICD-10-CM | POA: Diagnosis not present

## 2021-04-24 DIAGNOSIS — N183 Chronic kidney disease, stage 3 unspecified: Secondary | ICD-10-CM | POA: Diagnosis not present

## 2021-04-24 DIAGNOSIS — F4322 Adjustment disorder with anxiety: Secondary | ICD-10-CM | POA: Insufficient documentation

## 2021-04-24 DIAGNOSIS — M5030 Other cervical disc degeneration, unspecified cervical region: Secondary | ICD-10-CM | POA: Insufficient documentation

## 2021-04-24 DIAGNOSIS — Z9221 Personal history of antineoplastic chemotherapy: Secondary | ICD-10-CM | POA: Insufficient documentation

## 2021-04-24 DIAGNOSIS — F319 Bipolar disorder, unspecified: Secondary | ICD-10-CM | POA: Diagnosis not present

## 2021-04-24 DIAGNOSIS — Z923 Personal history of irradiation: Secondary | ICD-10-CM | POA: Insufficient documentation

## 2021-04-24 DIAGNOSIS — Z87440 Personal history of urinary (tract) infections: Secondary | ICD-10-CM | POA: Diagnosis not present

## 2021-04-24 DIAGNOSIS — Z79890 Hormone replacement therapy (postmenopausal): Secondary | ICD-10-CM | POA: Diagnosis not present

## 2021-04-24 DIAGNOSIS — C55 Malignant neoplasm of uterus, part unspecified: Secondary | ICD-10-CM

## 2021-04-24 DIAGNOSIS — E559 Vitamin D deficiency, unspecified: Secondary | ICD-10-CM | POA: Diagnosis not present

## 2021-04-24 DIAGNOSIS — I129 Hypertensive chronic kidney disease with stage 1 through stage 4 chronic kidney disease, or unspecified chronic kidney disease: Secondary | ICD-10-CM | POA: Diagnosis not present

## 2021-04-24 DIAGNOSIS — E785 Hyperlipidemia, unspecified: Secondary | ICD-10-CM | POA: Insufficient documentation

## 2021-04-24 DIAGNOSIS — Z8542 Personal history of malignant neoplasm of other parts of uterus: Secondary | ICD-10-CM | POA: Diagnosis not present

## 2021-04-24 DIAGNOSIS — E039 Hypothyroidism, unspecified: Secondary | ICD-10-CM | POA: Diagnosis not present

## 2021-04-24 DIAGNOSIS — N390 Urinary tract infection, site not specified: Secondary | ICD-10-CM

## 2021-04-24 DIAGNOSIS — Z90710 Acquired absence of both cervix and uterus: Secondary | ICD-10-CM | POA: Insufficient documentation

## 2021-04-24 DIAGNOSIS — F0781 Postconcussional syndrome: Secondary | ICD-10-CM | POA: Insufficient documentation

## 2021-04-24 DIAGNOSIS — J449 Chronic obstructive pulmonary disease, unspecified: Secondary | ICD-10-CM | POA: Insufficient documentation

## 2021-04-24 NOTE — Patient Instructions (Signed)
It was good to see you.  I do not see or feel any evidence of cancer recurrence on your exam.  I will see you back in 3 months.  Please call if anything changes before then.  I encourage you again to start the vaginal estrogen to hopefully decrease the number of urinary tract infections that you have.

## 2021-04-28 ENCOUNTER — Ambulatory Visit: Payer: Medicare Other | Admitting: Internal Medicine

## 2021-04-29 ENCOUNTER — Ambulatory Visit (INDEPENDENT_AMBULATORY_CARE_PROVIDER_SITE_OTHER): Payer: Medicare Other | Admitting: Psychology

## 2021-04-29 DIAGNOSIS — F4321 Adjustment disorder with depressed mood: Secondary | ICD-10-CM

## 2021-04-29 NOTE — Progress Notes (Addendum)
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 (Status: maintained) -- No Description Entered  Diminished interest in or enjoyment of activities. (Status: maintained) -- No Description Entered  Lack of energy. (Status: maintained) -- No Description Entered  Sad affect, social withdrawal, anxiety, loss of interest in activities, and low energy. (Status: maintained) -- No Description Entered  Medication Status compliance  Safety unspecified If Suicidal or Homicidal State Action Taken: unspecified  Current Risk:  Medications Buspar (Dosage: 30mg  2X/day)  New Medication (Dosage: 250mg  2x/day)  New Medication (Dosage: 250mg  2x/day)  Prozac (Dosage: 40mg  daily)  Remeron (Dosage: 30mg  nightly)  xanax (Dosage: .5mg  2-3X/day)  Objectives Related Problem: Recognize, accept, and cope with feelings of depression. Description: Identify and replace thoughts and beliefs that support depression. Target Date: 2021-06-29 Frequency: Daily Modality: individual Progress: 70%  Related Problem: Recognize, accept, and cope with feelings of depression. Description: Verbalize an understanding of healthy and unhealthy emotions with the intent of increasing the use of healthy emotions to guide actions. Target Date: 2021-06-29 Frequency: Daily Modality: individual Progress: 70%  Related Problem: Recognize, accept, and cope with feelings of depression. Description: Increasingly verbalize hopeful and positive statements regarding self, others, and the future. Target Date: 2021-06-29 Frequency: Daily Modality: individual Progress: 60%  Related Problem: Reduce fear, anxiety, and worry associated with the medical condition. Description: Share with significant others efforts to adapt successfully to the medical condition/diagnosis. Target Date: 2021-06-29 Frequency:  Daily Modality: individual Progress: 80%  Related Problem: Reduce fear, anxiety, and worry associated with the medical condition. Description: Engage in social, productive, and recreational activities that are possible in spite of medical condition. Target Date: 2021-06-29 Frequency: Daily Modality: individual Progress: 40%  Related Problem: Reduce fear, anxiety, and worry associated with the medical condition. Description: Identify the coping skills and sources of emotional support that have been beneficial in the past. Target Date: 2021-06-29 Frequency: Daily Modality: individual Progress: 50%  Client Response full compliance  Service Location Location, [name] [place], [place] [address]  Service Code cpt [phone]  Related past to present  Facilitate problem solving  Identify/label emotions  Validate/empathize  Self care activities  Lifestyle change (exercise, nutrition)  Assess/facilitate readiness to change  Relaxation training  Rationally challenge thoughts or beliefs/cognitive restructuring  Comments  Dx: F43.21  Meds: Prozac, [name], [name], [name], but her medical condition is variable and she requests additional treatment to manage moods throughout  her medical ordeal. In addition, she is trying to manage feelings associated with her older son's mental health struggles. Revised goal date is 2-23.  Patient agrees to have a video session due to the [facility] Virus. She is at home and I am in home office.   Dejon Jungman says she  is not doing well. Her son [name] was to come visit and did not show or respond to her texts or phone. She feared that he was drinking again.Her son lives with his cousin. She tried to reach him at his work and was told he had not shown up for work and they could not reach him. His friend tried to go to his house, but there was no answer. Her ex-husband checked and told her that [name] was fine. Joselynne Killam spoke to his roommate who said [name] was drinking all weekend and he has been "drinking a lot". Randal Yepiz wants him in a long term treatment program and her ex-husband Jenny Reichmann initially agreed. When [name] spoke to him, he sounded terrible. Jenny Reichmann is now saying that he does not think that [name] needs treatment. Also, [name] does not accept that he has a problem. We talked about her feelings of helplessness and strategies moving forward. Navjot Pilgrim is totally distraught and fears that he will continue to deteriorate.    Told Shifa Brisbon that she needs to talk with [name] face to face and try to convince him to seek help. She will try to get him to cone talk with her. On top of all this, she has another kidney infection and is emotionally depleted.   Kase Shughart LEWIS  Time: 3:10-4:00p 50 min.

## 2021-05-04 ENCOUNTER — Ambulatory Visit (INDEPENDENT_AMBULATORY_CARE_PROVIDER_SITE_OTHER): Payer: Medicare Other | Admitting: Psychology

## 2021-05-04 DIAGNOSIS — F4321 Adjustment disorder with depressed mood: Secondary | ICD-10-CM | POA: Diagnosis not present

## 2021-05-04 NOTE — Progress Notes (Signed)
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 (Status: maintained) -- No Description Entered  Diminished interest in or enjoyment of activities. (Status: maintained) -- No Description Entered  Lack of energy. (Status: maintained) -- No Description Entered  Sad affect, social withdrawal, anxiety, loss of interest in activities, and low energy. (Status: maintained) -- No Description Entered  Medication Status compliance  Safety unspecified If Suicidal or Homicidal State Action Taken: unspecified  Current Risk:  Medications Buspar (Dosage: 30mg  2X/day)  New Medication (Dosage: 250mg  2x/day)  New Medication (Dosage: 250mg  2x/day)  Prozac (Dosage: 40mg  daily)  Remeron (Dosage: 30mg  nightly)  xanax (Dosage: .5mg  2-3X/day)  Objectives Related Problem: Recognize, accept, and cope with feelings of depression. Description: Identify and replace thoughts and beliefs that support depression. Target Date: 2021-06-29 Frequency: Daily Modality: individual Progress: 70%  Related Problem: Recognize, accept, and cope with feelings of depression. Description: Verbalize an understanding of healthy and unhealthy emotions with the intent of increasing the use of healthy emotions to guide actions. Target Date: 2021-06-29 Frequency: Daily Modality: individual Progress: 70%  Related Problem: Recognize, accept, and cope with feelings of depression. Description: Increasingly verbalize hopeful and positive statements regarding self, others, and the future. Target Date: 2021-06-29 Frequency: Daily Modality: individual Progress: 60%  Related Problem: Reduce fear, anxiety, and worry associated with the medical condition. Description: Share with significant others efforts to adapt successfully to the medical condition/diagnosis. Target Date: 2021-06-29 Frequency:  Daily Modality: individual Progress: 80%  Related Problem: Reduce fear, anxiety, and worry associated with the medical condition. Description: Engage in social, productive, and recreational activities that are possible in spite of medical condition. Target Date: 2021-06-29 Frequency: Daily Modality: individual Progress: 40%  Related Problem: Reduce fear, anxiety, and worry associated with the medical condition. Description: Identify the coping skills and sources of emotional support that have been beneficial in the past. Target Date: 2021-06-29 Frequency: Daily Modality: individual Progress: 50%  Client Response full compliance  Service Location Location, [name] [place], [place] [address]  Service Code cpt [phone]  Related past to present  Facilitate problem solving  Identify/label emotions  Validate/empathize  Self care activities  Lifestyle change (exercise, nutrition)  Assess/facilitate readiness to change  Relaxation training  Rationally challenge thoughts or beliefs/cognitive restructuring  Comments  Dx: F43.21  Meds: Prozac, [name], [name], [name], but her medical condition is variable and she requests additional treatment to manage moods throughout  her medical ordeal. In addition, she is trying to manage feelings associated with her older son's mental health struggles. Revised goal date is 2-23.  Patient agrees to have a video session due to the [facility] Virus. She is at home and I am in home office.   Aanika Defoor says she  is feeling better than she did last week. She says she is remaining optimistic that she will "get better". She is also feeling better about her son's prognosis. She feels that he looks and sounds better, saying he knows what he needs to do to get better. [name] is telling Emmry Hinsch that his work is stable and he has adjusted schedule to help him remain sober. We discussed enabling and the need to be cautious. She is also aware that she has "been down this road" with [name] in the past. Consequently, "cautiously optimistic". Caylor Cerino says that she needs to start to manage her won anxiety. Has started to read "Mindfulness.Marland KitchenMarland KitchenA Practical Guide". She finds it to be helpful to "live in the moment". She will continue to read and we will discuss at the next session.          Rithy Mandley LEWIS Time: 4:30p-5:15 45 minutes

## 2021-05-05 ENCOUNTER — Telehealth (INDEPENDENT_AMBULATORY_CARE_PROVIDER_SITE_OTHER): Payer: Medicare Other | Admitting: Internal Medicine

## 2021-05-05 ENCOUNTER — Other Ambulatory Visit: Payer: Self-pay

## 2021-05-05 ENCOUNTER — Telehealth: Payer: Medicare Other

## 2021-05-05 DIAGNOSIS — G9332 Myalgic encephalomyelitis/chronic fatigue syndrome: Secondary | ICD-10-CM

## 2021-05-05 DIAGNOSIS — R3989 Other symptoms and signs involving the genitourinary system: Secondary | ICD-10-CM

## 2021-05-05 DIAGNOSIS — R0609 Other forms of dyspnea: Secondary | ICD-10-CM | POA: Diagnosis not present

## 2021-05-05 DIAGNOSIS — N183 Chronic kidney disease, stage 3 unspecified: Secondary | ICD-10-CM | POA: Diagnosis not present

## 2021-05-05 NOTE — Progress Notes (Signed)
Virtual Visit via Video Note  I connected with [name] on 05/05/21 at  2:40 PM EST by a video enabled telemedicine application and verified that I am speaking with the correct person using two identifiers.  The patient and the provider were at separate locations throughout the entire encounter. Patient location: home, Provider location: work   I discussed the limitations of evaluation and management by telemedicine and the availability of in person appointments. The patient expressed understanding and agreed to proceed. The patient and the provider were the only parties present for the visit unless noted in HPI below.  History of Present Illness: The patient is a 70 y.o. female with visit for worsening chronic fatigue. Worse in the past few days. Has not been right since bladder infection in October.  Observations/Objective: Appearance: tired in bed, breathing appears normal, casual grooming, mental status is A and O times 3  Assessment and Plan: See problem oriented charting  Follow Up Instructions: checking U/A and culture as well as labs to assess.   I discussed the assessment and treatment plan with the patient. The patient was provided an opportunity to ask questions and all were answered. The patient agreed with the plan and demonstrated an understanding of the instructions.   The patient was advised to call back or seek an in-person evaluation if the symptoms worsen or if the condition fails to improve as anticipated.  Hoyt Koch, MD

## 2021-05-05 NOTE — Progress Notes (Deleted)
Chronic Care Management Pharmacy Note  05/05/2021 Name:  [name] MRN:  [id number] DOB:  [birth year]  Summary: - Patient reports that she is continuing to recuperate from wedge resection surgery - most recently was advised to stop oral estrogen by onc gyn, started on vaginal estrace instead - patient continues on progesterone - notes to increased incidence of hot flashes since change - plans to discuss with ob/gyn at appointment next month -Patient reports that she continues to monitor blood pressure, has been elevated at times - patient reports due to stress, but is brought under control by addition of second furosemide tablet in the day -Patient has been unable to establish with PT yet as she has been having issues with her bowels and is unable to leave her house, has requested for home PT due to that reason  Recommendations/Changes made from today's visit: - Recommending no changes to medications at this time, patient to have lipid panel rechecked with next PCP appointment, possible that LDL is in range following major dietary changes, if not patient may benefit from statin medication for LDL reduction -Recommending for home PT order be placed so that patient is able to start PT -Patient to continue to monitor blood pressure and reach out should BP control be lost prior to next appointment   Subjective: [name] is an 70 y.o. year old female who is a primary patient of Plotnikov, [name], MD.  The CCM team was consulted for assistance with disease management and care coordination needs.    Engaged with patient by telephone for return visit in response to provider referral for pharmacy case management and/or care coordination services.   Consent to Services:  The patient was given the following information about Chronic Care Management services today, agreed to services, and gave verbal consent: 1. CCM service includes personalized support from designated clinical  staff supervised by the primary care provider, including individualized plan of care and coordination with other care providers 2. 24/7 contact phone numbers for assistance for urgent and routine care needs. 3. Service will only be billed when office clinical staff spend 20 minutes or more in a month to coordinate care. 4. Only one practitioner may furnish and bill the service in a calendar month. 5.The patient may stop CCM services at any time (effective at the end of the month) by phone call to the office staff. 6. The patient will be responsible for cost sharing (co-pay) of up to 20% of the service fee (after annual deductible is met). Patient agreed to services and consent obtained.  Patient Care Team: Plotnikov, [name], MD as PCP - General (Internal Medicine) Oren Binet, PhD as Consulting Physician (Psychology) Norma Fredrickson, MD as Consulting Physician (Psychiatry) Calvert Cantor, MD as Consulting Physician (Ophthalmology) Neldon Mc, Donnamarie Poag, MD as Consulting Physician (Allergy and Immunology) Alexis Frock, MD as Consulting Physician (Urology) Lafonda Mosses, MD as Consulting Physician (Gynecologic Oncology) Gery Pray, MD as Consulting Physician (Radiation Oncology) Mauri Pole, MD as Consulting Physician (Gastroenterology) Rosita Fire, MD as Consulting Physician (Nephrology) Melrose Nakayama, MD as Consulting Physician (Cardiothoracic Surgery) Sherlynn Stalls, MD as Consulting Physician (Ophthalmology) Delice Bison Darnelle Maffucci, [facility] (Pharmacist) Tomasa Blase, [facility] as Pharmacist (Pharmacist)  Recent office visits: 03/09/2021 - Dr. Sharlet Salina  - bladder pain - home testing consistent with UTI - prescribed bactrim x 2 weeks   Recent consult visits: 04/24/2021 - Dr. Berline Lopes - Gynecologic Oncology - no evidence if cancer on return exam -  follow up in 3 months for repeat imaging  04/15/2021 - Dr. Berline Lopes - Gynecologic Oncology - no changes in medications at this  time - follow up in 2 weeks  04/06/2021 - Dr. Berline Lopes - Gynecologic Oncology - note not available at this time   Hospital visits: None since last visit   Objective:  Lab Results  Component Value Date   CREATININE 1.56 (H) 02/26/2021   BUN 41 (H) 02/26/2021   GFR 32.88 (L) 12/11/2020   GFRNONAA 35 (L) 02/26/2021   GFRAA 50 (L) 11/26/2019   NA 135 02/26/2021   K 3.5 02/26/2021   CALCIUM 10.6 (H) 02/26/2021   CO2 27 02/26/2021   GLUCOSE 104 (H) 02/26/2021    Lab Results  Component Value Date/Time   HGBA1C 5.5 06/27/2017 01:15 PM   HGBA1C 5.5 09/23/2008 04:50 PM   GFR 32.88 (L) 12/11/2020 02:54 PM   GFR 38.72 (L) 10/09/2020 03:43 PM    Last diabetic Eye exam:  No results found for: HMDIABEYEEXA  Last diabetic Foot exam:  No results found for: HMDIABFOOTEX   Lab Results  Component Value Date   CHOL 263 (H) 10/18/2018   HDL 45.40 10/18/2018   LDLCALC UNABLE TO CALCULATE IF TRIGLYCERIDE OVER 400 mg/dL 08/26/2017   LDLDIRECT 160.0 10/18/2018   TRIG 271.0 (H) 10/18/2018   CHOLHDL 6 10/18/2018    Hepatic Function Latest Ref Rng & Units 12/11/2020 10/09/2020 07/19/2020  Total Protein 6.0 - 8.3 g/dL 7.2 6.6 5.4(L)  Albumin 3.5 - 5.2 g/dL 4.1 3.8 3.0(L)  AST 0 - 37 U/L 24 17 48(H)  ALT 0 - 35 U/L 24 14 56(H)  Alk Phosphatase 39 - 117 U/L 120(H) 103 68  Total Bilirubin 0.2 - 1.2 mg/dL 0.3 0.2 0.6  Bilirubin, Direct 0.0 - 0.3 mg/dL - - -    Lab Results  Component Value Date/Time   TSH 1.26 04/14/2020 02:46 PM   TSH 1.20 08/27/2019 05:08 PM   FREET4 1.08 10/19/2018 04:28 PM   FREET4 1.05 10/15/2016 11:59 AM    CBC Latest Ref Rng & Units 12/11/2020 10/09/2020 08/25/2020  WBC 4.0 - 10.5 K/uL 11.8(H) 6.7 7.1  Hemoglobin 12.0 - 15.0 g/dL 12.4 11.5(L) 11.6(L)  Hematocrit 36.0 - 46.0 % 38.0 33.3(L) 35.0(L)  Platelets 150.0 - 400.0 K/uL 432.0(H) 400.0 376    Lab Results  Component Value Date/Time   VD25OH 53.01 06/27/2017 01:15 PM   VD25OH 69.02 12/29/2016 08:50 AM     Clinical ASCVD: No  The 10-year ASCVD risk score (Arnett DK, et al., 2019) is: 14.7%   Values used to calculate the score:     Age: 40 years     Sex: Female     Is Non-Hispanic African American: No     Diabetic: No     Tobacco smoker: No     Systolic Blood Pressure: 253 mmHg     Is BP treated: Yes     HDL Cholesterol: 45.4 mg/dL     Total Cholesterol: 263 mg/dL    Depression screen PHQ 2/9 12/09/2020  Decreased Interest 1  Down, Depressed, Hopeless 0  PHQ - 2 Score 1  Altered sleeping 0  Tired, decreased energy 3  Change in appetite 0  Feeling bad or failure about yourself  1  Trouble concentrating 0  Moving slowly or fidgety/restless 0  Suicidal thoughts 0  PHQ-9 Score 5  Difficult doing work/chores Not difficult at all  Some recent data might be hidden    Social History  Tobacco Use  Smoking Status Never  Smokeless Tobacco Never   BP Readings from Last 3 Encounters:  04/24/21 125/76  03/09/21 122/80  12/31/20 139/73   Pulse Readings from Last 3 Encounters:  04/24/21 97  03/09/21 77  12/31/20 76   Wt Readings from Last 3 Encounters:  04/24/21 161 lb (73 kg)  03/09/21 162 lb 6.4 oz (73.7 kg)  12/31/20 161 lb 12.8 oz (73.4 kg)   BMI Readings from Last 3 Encounters:  04/24/21 29.45 kg/m  03/09/21 29.70 kg/m  12/31/20 29.59 kg/m    Assessment/Interventions: Review of patient past medical history, allergies, medications, health status, including review of consultants reports, laboratory and other test data, was performed as part of comprehensive evaluation and provision of chronic care management services.   SDOH:  (Social Determinants of Health) assessments and interventions performed: Yes   SDOH Screenings   Alcohol Screen: Not on file  Depression (PHQ2-9): Medium Risk   PHQ-2 Score: 5  Financial Resource Strain: Not on file  Food Insecurity: Not on file  Housing: Not on file  Physical Activity: Not on file  Social Connections: Not on file   Stress: Not on file  Tobacco Use: Low Risk    Smoking Tobacco Use: Never   Smokeless Tobacco Use: Never   Passive Exposure: Not on file  Transportation Needs: Not on file    [place]  Allergies  Allergen Reactions   Buprenorphine Hcl Other (See Comments)    Severe headache, confusion   Morphine And Related Other (See Comments)    Severe headache   Buprenorphine Other (See Comments)   Cephalexin Nausea And Vomiting and Nausea Only    Other reaction(s): Vomiting   Other Other (See Comments)    Trees & Grass = allergic symptoms   Sumatriptan Other (See Comments)   Codeine Other (See Comments)    Disorientation   Dust Mite Extract Cough   Molds & Smuts Cough    Medications Reviewed Today     Reviewed by Lafonda Mosses, MD (Physician) on 04/24/21 at [place] List Status: <None>   Medication Order Taking? Sig Documenting Provider Last Dose Status Informant  acetaminophen (TYLENOL) 325 MG tablet [id number] No Take 650 mg by mouth every 6 (six) hours as needed for moderate pain.  [provider] Taking Active Self  ALPRAZolam (XANAX) 0.5 MG tablet [id number] No Take 1 tablet (0.5 mg total) by mouth 2 (two) times daily as needed for anxiety.  Patient taking differently: Take 0.5 mg by mouth at bedtime as needed for anxiety.   Plotnikov, [name], MD Taking Active   aspirin EC 81 MG tablet [id number] No Take 81 mg by mouth daily.  Patient not taking: Reported on 04/06/2021   [provider] Not Taking Active Self  Bromelains (BROMELAIN PO) [id number] No Take 750 mg by mouth daily.  Patient not taking: Reported on 04/06/2021   [provider] Not Taking Active   busPIRone (BUSPAR) 30 MG tablet [id number] No Take 30 mg by mouth 2 (two) times daily. [provider] Taking Active Self  Cholecalciferol (VITAMIN D3) 125 MCG (5000 UT) CAPS [id number] No Take 1 capsule by mouth daily. [provider] Taking Active   colesevelam [facility])  625 MG tablet [id number] No Take 2 tablets (1,250 mg total) by mouth 2 (two) times daily with a meal. Plotnikov, [name], MD Taking Expired 04/14/21 2359 Self  D-Mannose 500 MG CAPS [id number] No Take 500 mg by mouth 2 (two)  times daily. WITH CRANBERRY & DANDELION EXTRACT [provider] Taking Active Self  esomeprazole (NEXIUM) 40 MG capsule [id number] No Take 1 by mouth in the morning [provider] Taking Active   estradiol (ESTRACE VAGINAL) 0.1 MG/GM vaginal cream [id number] No Place 1 Applicatorful vaginally at bedtime. Place a small amount on your finger tip and insert int he vagina 3x a week at night  Patient not taking: Reported on 04/06/2021   Lafonda Mosses, MD Not Taking Active   Patient not taking:  Discontinued 11/05/19 0627 FERREX 150 150 MG capsule [id number] No TAKE 1 CAPSULE BY MOUTH TWICE A DAY Plotnikov, [name], MD Taking Active   FLUoxetine (PROZAC) 40 MG capsule [id number] No Take 1 capsule (40 mg total) by mouth every morning. Plotnikov, [name], MD Taking Active Self  furosemide (LASIX) 20 MG tablet [id number] No TAKE 1-2 TABLETS (20-40 MG TOTAL) BY MOUTH DAILY. Plotnikov, [name], MD Taking Active   ipratropium-albuterol (DUONEB) 0.5-2.5 (3) MG/3ML SOLN [id number] No Take 3 mLs by nebulization every 6 (six) hours as needed (Asthma). [provider] Taking Active   levothyroxine (SYNTHROID) 100 MCG tablet [id number] No Take 1 tablet (100 mcg total) by mouth daily before breakfast. Annual appt w/labs are due. Most see provider for future refills Plotnikov, [name], MD Taking Active   lisinopril (ZESTRIL) 20 MG tablet [id number] No TAKE 1 (ONE) TABLET IN THE EVENING Plotnikov, [name], MD Taking Active   loperamide (IMODIUM A-D) 2 MG tablet [id number] No Take 2 mg by mouth 4 (four) times daily as needed for diarrhea or loose stools. [provider] Taking Active   METAMUCIL FIBER PO [id number] No Take 1 capsule by mouth in the morning and  at bedtime. [provider] Taking Active   mirtazapine (REMERON) 30 MG tablet [id number] No Take 1 tablet (30 mg total) by mouth at bedtime. Plotnikov, [name], MD Taking Active Self  Multiple Vitamins-Minerals (MULTIVITAMIN WITH MINERALS) tablet [id number] No Take 1 tablet by mouth daily. Niel Hummer, NP Taking Active Self  Omega-3 Fatty Acids (FISH OIL) 1000 MG CPDR [id number] No Take 1,000 mg by mouth daily. [provider] Taking Active Self  OVER THE COUNTER MEDICATION [id number] No 7 MUSHROOM BLEND take 1 capsule every day [provider] Taking Active   Polyethyl Glycol-Propyl Glycol (SYSTANE OP) [id number] No Place 1 drop into both eyes 2 (two) times daily as needed (dry eyes).  [provider] Taking Active Self  progesterone (PROMETRIUM) 100 MG capsule [id number] No Take 100 mg by mouth at bedtime. [provider] Taking Active Self  Spirulina 500 MG TABS [id number] No Take 1 tablet by mouth daily.  Patient not taking: Reported on 04/06/2021   [provider] Not Taking Active             Patient Active Problem List   Diagnosis Date Noted   Atherosclerosis of aorta ([place]) 12/11/2020   Low BP 09/10/2020   Physical deconditioning 09/10/2020   Osteopenia 03/24/2020   Genital herpes simplex 03/24/2020   Chronic fatigue syndrome 03/24/2020   Bipolar I disorder ([place]) 03/24/2020   Wears glasses    Urgency of urination    Tuberculosis    Status post chemotherapy    S/P dilatation of esophageal stricture    Renal calculus, bilateral    Positive reaction to tuberculin skin test    Pneumonia    Mild persistent asthma    IDA (iron deficiency anemia)    Hx of  transfusion of packed red blood cells    History of syncope    History of positive PPD    History of kidney stones    History of DVT (deep vein thrombosis)    History of concussion    GERD (gastroesophageal reflux disease)    GAD (generalized anxiety disorder)    CKD  (chronic kidney disease), stage III (HCC)    Chronically dry eyes    Chronic cystitis    Chronic bronchitis (HCC)    Arthritis    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   Pelvic mass in female 09/20/2019   Ureteral stone with hydronephrosis 09/04/2019   Edema 08/27/2019   Bladder pain 08/2019   Dysuria 05/17/2019   Suspected 2019 novel coronavirus infection 04/25/2019   Urinary urgency 03/06/2019   Diarrhea 03/06/2019   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   Dyspnea on exertion 08/25/2017   Chest pain in adult 07/13/2017   HTN (hypertension) 05/31/2017   Anemia 05/31/2017   Leg pain, anterior, left 12/15/2016   Paresthesia 12/15/2016   Syncope 10/13/2016   Migraine headache 10/13/2016   Asthma 10/13/2016   Anxiety 10/13/2016   Hyperlipidemia 10/13/2016   UTI (urinary tract infection) 10/13/2016   Hypothyroidism 10/13/2016   Laryngopharyngeal reflux (LPR) 06/15/2016   Acute upper respiratory infection 05/05/2016   Degenerative disc disease, cervical 11/29/2015   IBS (irritable bowel syndrome) 11/24/2015   Facet arthritis of cervical region 10/16/2015   Chronic urinary tract infection 07/22/2015   Malaise and fatigue 04/16/2015   Severe episode of recurrent major depressive disorder, without psychotic features ([place]) 12/18/2014   Post concussion syndrome 10/28/2014   Brain syndrome, 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   Depression 10/25/2014   Major depressive disorder, recurrent, severe without psychotic features ([place])    Cervical nerve root disorder 09/18/2014   Concussion w/o coma 09/18/2014   Irritable bowel syndrome with diarrhea 09/12/2014   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   Cold intolerance 03/26/2014   Cough variant asthma  vs UACS/ irritable larynx  03/26/2014   Abdominal pain 03/26/2014   Positive skin test 03/26/2014   Adjustment disorder with anxiety 02/07/2014   Cervical pain 12/05/2013   Fibromyalgia  12/05/2013   Cephalalgia 12/05/2013   Adult hypothyroidism 12/05/2013   Adaptive colitis 12/05/2013   Menopause 12/05/2013   Mild cognitive disorder 12/05/2013   Feeling bilious 12/05/2013   Leiomyosarcoma ([place]) 09/10/2013   Lung mass 09/10/2013   Lung nodule, solitary 09/10/2013   Abnormal chest x-ray    Unspecified hypothyroidism 04/25/2013   Vitamin D deficiency 04/25/2013   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   Female genuine stress incontinence 04/12/2012   Healthcare maintenance 10/30/2010   MUSCLE PAIN 07/03/2008   BIPLR I MOST RECENT EPIS DPRS SEV NO PSYCHOT BHV 02/14/2008   DEEP PHLEBOTHROMBOSIS PP UNSPEC AS EPIS CARE 02/14/2008   Bipolar affective disorder, current episode depressed ([place]) 02/14/2008   Deep phlebothrombosis, postpartum 02/14/2008   HYPERLIPIDEMIA 03/04/2007   ALLERGIC RHINITIS 03/04/2007   GERD 03/04/2007   IRRITABLE BOWEL SYNDROME, HX OF 03/04/2007    Immunization History  Administered Date(s) Administered   Fluad Quad(high Dose 65+) 04/14/2020   H1N1 06/14/2008   Influenza Whole 02/14/2008, 02/17/2009, 03/17/2013   Influenza, High Dose Seasonal PF 04/02/2015, 05/31/2017, 03/16/2018, 12/27/2018   Influenza-Unspecified 03/24/2016   MMR 06/19/2009   PFIZER(Purple Top)SARS-COV-2 Vaccination 06/05/2019, 06/23/2019, 02/14/2020   Pneumococcal Conjugate-13 05/05/2016   Pneumococcal Polysaccharide-23 05/31/2017   Td 05/17/2000, 06/19/2009   Zoster Recombinat (  Shingrix) 02/25/2021   Zoster, Live 04/16/2014    Conditions to be addressed/monitored:  Hypertension, Hyperlipidemia, GERD, Chronic Kidney Disease, Depression, Anxiety, Osteopenia, IBS, Migraines, Chronic UTIs, and Hypothyroidism  There are no care plans that you recently modified to display for this patient.       Medication Assistance: None required.  Patient affirms current coverage meets needs.  Care Gaps: Mammogram   Patient's preferred pharmacy is:  [id number]  [id number]: [phone]: [phone]   Uses pill box? Yes Pt endorses 100% compliance  Care Plan and Follow Up Patient Decision:  Patient agrees to Care Plan and Follow-up.  Plan: Telephone follow up appointment with care management team member scheduled for:  3 months and The patient has been provided with contact information for the care management team and has been advised to call with any health related questions or concerns.   DTomasa Blase PharmD Clinical Pharmacist, [place]

## 2021-05-06 ENCOUNTER — Encounter: Payer: Self-pay | Admitting: Internal Medicine

## 2021-05-06 NOTE — Assessment & Plan Note (Signed)
Checking CMP.  

## 2021-05-06 NOTE — Assessment & Plan Note (Signed)
Checking CBC, B12, ferritin, CMP, TSH, vitamin D, U/A and urine culture to assess worsening fatigue on top of chronic fatigue. No sick contacts or recent contact with others to suggest possible viral etiology although we discussed this is a possibility.

## 2021-05-07 ENCOUNTER — Encounter: Payer: Self-pay | Admitting: Internal Medicine

## 2021-05-07 ENCOUNTER — Other Ambulatory Visit: Payer: Self-pay

## 2021-05-07 ENCOUNTER — Other Ambulatory Visit (INDEPENDENT_AMBULATORY_CARE_PROVIDER_SITE_OTHER): Payer: Medicare Other

## 2021-05-07 DIAGNOSIS — G9332 Myalgic encephalomyelitis/chronic fatigue syndrome: Secondary | ICD-10-CM | POA: Diagnosis not present

## 2021-05-07 DIAGNOSIS — N179 Acute kidney failure, unspecified: Secondary | ICD-10-CM

## 2021-05-07 DIAGNOSIS — R0609 Other forms of dyspnea: Secondary | ICD-10-CM | POA: Diagnosis not present

## 2021-05-07 DIAGNOSIS — R3989 Other symptoms and signs involving the genitourinary system: Secondary | ICD-10-CM | POA: Diagnosis not present

## 2021-05-07 LAB — URINALYSIS, ROUTINE W REFLEX MICROSCOPIC
Bilirubin Urine: NEGATIVE
Hgb urine dipstick: NEGATIVE
Ketones, ur: NEGATIVE
Nitrite: POSITIVE — AB
RBC / HPF: NONE SEEN (ref 0–?)
Specific Gravity, Urine: 1.01 (ref 1.000–1.030)
Total Protein, Urine: NEGATIVE
Urine Glucose: NEGATIVE
Urobilinogen, UA: 0.2 (ref 0.0–1.0)
pH: 6 (ref 5.0–8.0)

## 2021-05-07 LAB — CBC
HCT: 37.1 % (ref 36.0–46.0)
Hemoglobin: 12.4 g/dL (ref 12.0–15.0)
MCHC: 33.5 g/dL (ref 30.0–36.0)
MCV: 91.8 fl (ref 78.0–100.0)
Platelets: 421 10*3/uL — ABNORMAL HIGH (ref 150.0–400.0)
RBC: 4.04 Mil/uL (ref 3.87–5.11)
RDW: 13.9 % (ref 11.5–15.5)
WBC: 7.3 10*3/uL (ref 4.0–10.5)

## 2021-05-07 LAB — COMPREHENSIVE METABOLIC PANEL
ALT: 22 U/L (ref 0–35)
AST: 27 U/L (ref 0–37)
Albumin: 4.3 g/dL (ref 3.5–5.2)
Alkaline Phosphatase: 118 U/L — ABNORMAL HIGH (ref 39–117)
BUN: 51 mg/dL — ABNORMAL HIGH (ref 6–23)
CO2: 23 mEq/L (ref 19–32)
Calcium: 11.5 mg/dL — ABNORMAL HIGH (ref 8.4–10.5)
Chloride: 102 mEq/L (ref 96–112)
Creatinine, Ser: 2.33 mg/dL — ABNORMAL HIGH (ref 0.40–1.20)
GFR: 20.57 mL/min — ABNORMAL LOW (ref >60.00)
Glucose, Bld: 102 mg/dL — ABNORMAL HIGH (ref 70–99)
Potassium: 4 mEq/L (ref 3.5–5.1)
Sodium: 135 mEq/L (ref 135–145)
Total Bilirubin: 0.5 mg/dL (ref 0.2–1.2)
Total Protein: 7.4 g/dL (ref 6.0–8.3)

## 2021-05-07 LAB — VITAMIN B12: Vitamin B-12: 801 pg/mL (ref 211–911)

## 2021-05-07 LAB — VITAMIN D 25 HYDROXY (VIT D DEFICIENCY, FRACTURES): VITD: 69.27 ng/mL (ref 30.00–100.00)

## 2021-05-07 LAB — TSH: TSH: 0.88 u[IU]/mL (ref 0.35–5.50)

## 2021-05-07 LAB — FERRITIN: Ferritin: 139.2 ng/mL (ref 10.0–291.0)

## 2021-05-08 ENCOUNTER — Encounter: Payer: Self-pay | Admitting: Internal Medicine

## 2021-05-09 LAB — URINE CULTURE

## 2021-05-12 ENCOUNTER — Telehealth: Payer: Self-pay | Admitting: Internal Medicine

## 2021-05-12 MED ORDER — SULFAMETHOXAZOLE-TRIMETHOPRIM 800-160 MG PO TABS: 1.0000 | ORAL_TABLET | Freq: Two times a day (BID) | ORAL | 0 refills | Status: DC

## 2021-05-12 MED ORDER — NIRMATRELVIR/RITONAVIR (PAXLOVID) TABLET (RENAL DOSING): 2.0000 | ORAL_TABLET | Freq: Two times a day (BID) | ORAL | 0 refills | Status: AC

## 2021-05-12 NOTE — Telephone Encounter (Signed)
Patient calling in  Patient is wanting to know when provider will review urinalysis & send something over to pharmacy to clear this up.. patient also says she has tested covid+ 12.27.22 & is experiencing sore throat, cough, headahces & wants antiviral sent to pharmacy as well  Please call patient [phone]   [id number] [place], [place]. Phone:  [phone]  Fax:  [phone]

## 2021-05-12 NOTE — Telephone Encounter (Signed)
Connected to [facility] 12.23.2022.    Caller states she had blood work and UA done after virtual visit, concerned that UTI was positive and GFR is 20.5. Hx of bladder Cancer, kidney surgery. Urinary frequency and burning, headache and nausea. Denies Fever.    Advised to see HCP within 4 hours. Spoke with pt and she is requesting to speak with an Environmental consultant. Pt spoke with urologist and was prescribed antibiotic.    Please call pt to follow up.

## 2021-05-12 NOTE — Telephone Encounter (Signed)
Staff to see note to pt

## 2021-05-13 ENCOUNTER — Ambulatory Visit: Payer: Medicare Other | Admitting: Psychology

## 2021-05-13 MED ORDER — CIPROFLOXACIN HCL 250 MG PO TABS: 250.0000 mg | ORAL_TABLET | Freq: Every day | ORAL | 1 refills | Status: DC

## 2021-05-13 NOTE — Telephone Encounter (Signed)
[facility]  - Rx emailed Thx

## 2021-05-13 NOTE — Telephone Encounter (Signed)
Spoke with patient this morning and info given.

## 2021-05-22 ENCOUNTER — Other Ambulatory Visit: Payer: Self-pay | Admitting: Internal Medicine

## 2021-05-27 ENCOUNTER — Ambulatory Visit (INDEPENDENT_AMBULATORY_CARE_PROVIDER_SITE_OTHER): Payer: 59 | Admitting: Psychology

## 2021-05-27 DIAGNOSIS — F4321 Adjustment disorder with depressed mood: Secondary | ICD-10-CM

## 2021-05-27 NOTE — Progress Notes (Signed)
[name] is a 71 y.o. 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 (Status: maintained) -- No Description Entered  Diminished interest in or enjoyment of activities. (Status: maintained) -- No Description Entered  Lack of energy. (Status: maintained) -- No Description Entered  Sad affect, social withdrawal, anxiety, loss of interest in activities, and low energy. (Status: maintained) -- No Description Entered  Medication Status compliance  Safety unspecified If Suicidal or Homicidal State Action Taken: unspecified  Current Risk:  Medications Buspar (Dosage: 30mg  2X/day)  New Medication (Dosage: 250mg  2x/day)  New Medication (Dosage: 250mg  2x/day)  Prozac (Dosage: 40mg  daily)  Remeron (Dosage: 30mg  nightly)  xanax (Dosage: .5mg  2-3X/day)  Objectives Related Problem: Recognize, accept, and cope with feelings of depression. Description: Identify and replace thoughts and beliefs that support depression. Target Date: 2021-06-29 Frequency: Daily Modality: individual Progress: 70%  Related Problem: Recognize, accept, and cope with feelings of depression. Description: Verbalize an understanding of healthy and unhealthy emotions with the intent of increasing the use of healthy emotions to guide actions. Target Date: 2021-06-29 Frequency: Daily Modality: individual Progress: 70%  Related Problem: Recognize, accept, and cope with feelings of depression. Description: Increasingly verbalize hopeful and positive statements regarding self, others, and the future. Target Date: 2021-06-29 Frequency: Daily Modality: individual Progress: 70%  Related Problem: Reduce fear, anxiety, and worry associated with the medical condition. Description: Share with significant others efforts to adapt successfully to the medical  condition/diagnosis. Target Date: 2021-06-29 Frequency: Daily Modality: individual Progress: 80%  Related Problem: Reduce fear, anxiety, and worry associated with the medical condition. Description: Engage in social, productive, and recreational activities that are possible in spite of medical condition. Target Date: 2021-06-29 Frequency: Daily Modality: individual Progress: 40%  Related Problem: Reduce fear, anxiety, and worry associated with the medical condition. Description: Identify the coping skills and sources of emotional support that have been beneficial in the past. Target Date: 2021-06-29 Frequency: Daily Modality: individual Progress: 50%  Client Response full compliance  Service Location Location, [name] [place], [place] [address]  Service Code cpt [phone]  Related past to present  Facilitate problem solving  Identify/label emotions  Validate/empathize  Self care activities  Lifestyle change (exercise, nutrition)  Assess/facilitate readiness to change  Relaxation training  Rationally challenge thoughts or beliefs/cognitive restructuring  Comments  Dx: F43.21  Meds: Prozac, [name], [name], [name], but her medical condition is variable and she  requests additional treatment to manage moods throughout her medical ordeal. In addition, she is trying to manage feelings associated with her older son's mental health struggles. Revised goal date is 2-23.  Patient agrees to have a video session due to the [facility] Virus. She is at home and I  am in home office.   [name] says that she is just now recovering from Covid 19. She also had another UTI. She had serious symptoms with the virus. She tested positive on Christmas Day and also found out some of her blood work was abnormal, which had potentially concerning implications. She had significant problems regarding an after hours nurse that told her there was no doctor on call. She was very distressed/angry at the situation. She was quarantined during both Christmas and New Years. She may have to go on dialysis, but is hopeful kidney function won't worsen. She is handling things much better now that her symptoms are better. Her kids have kept up with her and been supportive. Less depressed and anxious tha she was past few weeks.              [name] Time: 3:00p-3:45 45 minutes

## 2021-06-01 ENCOUNTER — Ambulatory Visit (INDEPENDENT_AMBULATORY_CARE_PROVIDER_SITE_OTHER): Payer: 59 | Admitting: Psychology

## 2021-06-01 DIAGNOSIS — F4321 Adjustment disorder with depressed mood: Secondary | ICD-10-CM | POA: Diagnosis not present

## 2021-06-01 NOTE — Progress Notes (Signed)
[name] is a 71 y.o. female patient  Date: 06/01/2021  Treatment Plan Diagnosis F43.21 (Adjustment Disorder, With depressed mood) [n/a]  Symptoms A diagnosis of an acute, serious illness that is life-threatening. (Status: maintained) -- No Description Entered  Diminished interest in or enjoyment of activities. (Status: maintained) -- No Description Entered  Grant of energy. (Status: maintained) -- No Description Entered  Sad affect, social withdrawal, anxiety, loss of interest in activities, and low energy. (Status: maintained) -- No Description Entered  Medication Status compliance  Safety unspecified If Suicidal or Homicidal State Action Taken: unspecified  Current Risk:  Medications Buspar (Dosage: 30mg  2X/day)  New Medication (Dosage: 250mg  2x/day)  New Medication (Dosage: 250mg  2x/day)  Prozac (Dosage: 40mg  daily)  Remeron (Dosage: 30mg  nightly)  xanax (Dosage: .5mg  2-3X/day)  Objectives Related Problem: Recognize, accept, and cope with feelings of depression. Description: Identify and replace thoughts and beliefs that support depression. Target Date: 2021-06-29 Frequency: Daily Modality: individual Progress: 70%  Related Problem: Recognize, accept, and cope with feelings of depression. Description: Verbalize an understanding of healthy and unhealthy emotions with the intent of increasing the use of healthy emotions to guide actions. Target Date: 2021-06-29 Frequency: Daily Modality: individual Progress: 70%  Related Problem: Recognize, accept, and cope with feelings of depression. Description: Increasingly verbalize hopeful and positive statements regarding self, others, and the future. Target Date: 2021-06-29 Frequency: Daily Modality: individual Progress: 70%  Related Problem: Reduce fear, anxiety, and worry associated with the medical condition. Description: Share with significant others efforts to adapt successfully to the medical  condition/diagnosis. Target Date: 2021-06-29 Frequency: Daily Modality: individual Progress: 80%  Related Problem: Reduce fear, anxiety, and worry associated with the medical condition. Description: Engage in social, productive, and recreational activities that are possible in spite of medical condition. Target Date: 2021-06-29 Frequency: Daily Modality: individual Progress: 40%  Related Problem: Reduce fear, anxiety, and worry associated with the medical condition. Description: Identify the coping skills and sources of emotional support that have been beneficial in the past. Target Date: 2021-06-29 Frequency: Daily Modality: individual Progress: 50%  Client Response full compliance  Service Location Location, [name] [place], [place] [address]  Service Code cpt [phone]  Related past to present  Facilitate problem solving  Identify/label emotions  Validate/empathize  Self care activities  Lifestyle change (exercise, nutrition)  Assess/facilitate readiness to change  Relaxation training  Rationally challenge thoughts or beliefs/cognitive restructuring  Comments  Dx: F43.21  Meds: Prozac, [name], [name], [name], but her medical condition is variable and she  requests additional treatment to manage moods throughout her medical ordeal. In addition, she is trying to manage feelings associated with her older son's mental health struggles. Revised goal date is 2-23.  Patient agrees to have a video session due to the [name]. She is at home and I  am in home office.   [name] she had a bad migraine today for the first time in a while, so she slept most of the day. Her Covid 19 test is negative and her UTI has cleared up. Her son [name] can to have a meal with her, which was very enjoyable. She talked about [employer], which includes getting a masters degree. [name] is very proud of him. She is feeling better (physically) with the exception of today's headache. She got a call from son [name] and Grant he sounded good. Contact with him has been minimal. She is hopeful for him, but very cautious about her optimism. She Grant she is "preparing for the worst and hoping for the best". His latest idea is to get certified to be a traveling [name].                   [name] Time: 4:10p-5:00 50 minutes                      Charlotte Morel, PhD

## 2021-06-05 ENCOUNTER — Telehealth: Payer: Self-pay | Admitting: Internal Medicine

## 2021-06-09 ENCOUNTER — Telehealth: Payer: Self-pay

## 2021-06-09 DIAGNOSIS — R300 Dysuria: Secondary | ICD-10-CM

## 2021-06-09 DIAGNOSIS — N390 Urinary tract infection, site not specified: Secondary | ICD-10-CM

## 2021-06-09 NOTE — Telephone Encounter (Signed)
Pt is calling to request a UA be add to her labs pt states she has had burning and frequency.  Pt coming to lab tomorrow around lunch for other labs.  Please advise if this can be done.  Pt CB [phone]

## 2021-06-10 ENCOUNTER — Ambulatory Visit (INDEPENDENT_AMBULATORY_CARE_PROVIDER_SITE_OTHER): Payer: Medicare Other | Admitting: Psychology

## 2021-06-10 ENCOUNTER — Telehealth: Payer: Self-pay

## 2021-06-10 DIAGNOSIS — F4321 Adjustment disorder with depressed mood: Secondary | ICD-10-CM | POA: Diagnosis not present

## 2021-06-10 NOTE — Telephone Encounter (Signed)
Called pt inform will place order for UA. Pt will come tomorrow to have done..[name]

## 2021-06-10 NOTE — Chronic Care Management (AMB) (Signed)
Chronic Care Management Pharmacy Assistant   Name: [name]  MRN: [id number] DOB: 1950-05-19   Reason for Encounter: Disease State-General Call  Appt:08/05/21 @ 10:45am with Linna Hoff  Recent office visits:  05/05/21 Hoyt Koch, MD-PCP (Bladder pain) Labs:CBC, B12, ferritin, CMP, TSH, vitamin D, U/A and urine culture, no med changes  Recent consult visits:  04/24/21 Lafonda Mosses, MD-Gynecologic/Oncology (Leiomyosarcoma of uterus) no orders or med changes  04/15/21 Lafonda Mosses, MD-Gynecologic/Oncology (Leiomyosarcoma of uterus) no orders or med changes  04/06/21 Lafonda Mosses, MD-Gynecologic/Oncology (Leiomyosarcoma of uterus) no orders or med changes  Hospital visits:  None in previous 6 months  Medications: Outpatient Encounter Medications as of 06/10/2021  Medication Sig   colesevelam (WELCHOL) 625 MG tablet TAKE 2 TABLETS (1,250 MG TOTAL) BY MOUTH 2 (TWO) TIMES DAILY WITH A MEAL.   acetaminophen (TYLENOL) 325 MG tablet Take 650 mg by mouth every 6 (six) hours as needed for moderate pain.    ALPRAZolam (XANAX) 0.5 MG tablet Take 1 tablet (0.5 mg total) by mouth 2 (two) times daily as needed for anxiety. (Patient taking differently: Take 0.5 mg by mouth at bedtime as needed for anxiety.)   aspirin EC 81 MG tablet Take 81 mg by mouth daily. (Patient not taking: Reported on 04/06/2021)   Bromelains (BROMELAIN PO) Take 750 mg by mouth daily. (Patient not taking: Reported on 04/06/2021)   busPIRone (BUSPAR) 30 MG tablet Take 30 mg by mouth 2 (two) times daily.   Cholecalciferol (VITAMIN D3) 125 MCG (5000 UT) CAPS Take 1 capsule by mouth daily.   ciprofloxacin (CIPRO) 250 MG tablet Take 1 tablet (250 mg total) by mouth daily with breakfast.   D-Mannose 500 MG CAPS Take 500 mg by mouth 2 (two) times daily. WITH CRANBERRY & DANDELION EXTRACT   esomeprazole (NEXIUM) 40 MG capsule Take 1 by mouth in the morning   estradiol (ESTRACE VAGINAL) 0.1 MG/GM  vaginal cream Place 1 Applicatorful vaginally at bedtime. Place a small amount on your finger tip and insert int he vagina 3x a week at night (Patient not taking: Reported on 04/06/2021)   FERREX 150 150 MG capsule TAKE 1 CAPSULE BY MOUTH TWICE A DAY   FLUoxetine (PROZAC) 40 MG capsule Take 1 capsule (40 mg total) by mouth every morning.   furosemide (LASIX) 20 MG tablet TAKE 1-2 TABLETS (20-40 MG TOTAL) BY MOUTH DAILY.   ipratropium-albuterol (DUONEB) 0.5-2.5 (3) MG/3ML SOLN Take 3 mLs by nebulization every 6 (six) hours as needed (Asthma).   levothyroxine (SYNTHROID) 100 MCG tablet Take 1 tablet (100 mcg total) by mouth daily before breakfast. Annual appt w/labs are due. Most see provider for future refills   lisinopril (ZESTRIL) 20 MG tablet TAKE 1 (ONE) TABLET IN THE EVENING   loperamide (IMODIUM A-D) 2 MG tablet Take 2 mg by mouth 4 (four) times daily as needed for diarrhea or loose stools.   METAMUCIL FIBER PO Take 1 capsule by mouth in the morning and at bedtime.   mirtazapine (REMERON) 30 MG tablet Take 1 tablet (30 mg total) by mouth at bedtime.   Multiple Vitamins-Minerals (MULTIVITAMIN WITH MINERALS) tablet Take 1 tablet by mouth daily.   Omega-3 Fatty Acids (FISH OIL) 1000 MG CPDR Take 1,000 mg by mouth daily.   OVER THE COUNTER MEDICATION 7 MUSHROOM BLEND take 1 capsule every day   Polyethyl Glycol-Propyl Glycol (SYSTANE OP) Place 1 drop into both eyes 2 (two) times daily as needed (dry eyes).  progesterone (PROMETRIUM) 100 MG capsule Take 100 mg by mouth at bedtime.   Spirulina 500 MG TABS Take 1 tablet by mouth daily. (Patient not taking: Reported on 04/06/2021)   sulfamethoxazole-trimethoprim (BACTRIM DS) 800-160 MG tablet Take 1 tablet by mouth 2 (two) times daily.   [DISCONTINUED] famotidine (PEPCID) 40 MG tablet TAKE 1 TABLET BY MOUTH EVERY DAY IN THE EVENING (Patient not taking: No sig reported)   No facility-administered encounter medications on file as of 06/10/2021.    Reviewed chart for medication changes and drug therapy problems ahead of medication adherence call.  Attempted to contact patient x 3 for medication review and health check, unable to reach patient, left voicemails to return call.     Care Gaps: Colonoscopy-05/12/16 Diabetic Foot Exam-NA Mammogram-06/15/10 Ophthalmology-NA Dexa Scan - NA Annual Well Visit - NA Micro albumin-NA Hemoglobin A1c- NA  Star Rating Drugs: Lisinopril 20 mg-last fill 05/15/21 90 ds  [place] Pharmacist Assistant [phone]

## 2021-06-10 NOTE — Progress Notes (Signed)
[name] is a 71 y.o. female patient  Date: 06/10/2021  Treatment Plan Diagnosis F43.21 (Adjustment Disorder, With depressed mood) [n/a]  Symptoms A diagnosis of an acute, serious illness that is life-threatening. (Status: maintained) -- No Description Entered  Diminished interest in or enjoyment of activities. (Status: maintained) -- No Description Entered  Lack of energy. (Status: maintained) -- No Description Entered  Sad affect, social withdrawal, anxiety, loss of interest in activities, and low energy. (Status: maintained) -- No Description Entered  Medication Status compliance  Safety unspecified If Suicidal or Homicidal State Action Taken: unspecified  Current Risk:  Medications Buspar (Dosage: 30mg  2X/day)  New Medication (Dosage: 250mg  2x/day)  New Medication (Dosage: 250mg  2x/day)  Prozac (Dosage: 40mg  daily)  Remeron (Dosage: 30mg  nightly)  xanax (Dosage: .5mg  2-3X/day)  Objectives Related Problem: Recognize, accept, and cope with feelings of depression. Description: Identify and replace thoughts and beliefs that support depression. Target Date: 2021-06-29 Frequency: Daily Modality: individual Progress: 70%  Related Problem: Recognize, accept, and cope with feelings of depression. Description: Verbalize an understanding of healthy and unhealthy emotions with the intent of increasing the use of healthy emotions to guide actions. Target Date: 2021-06-29 Frequency: Daily Modality: individual Progress: 70%  Related Problem: Recognize, accept, and cope with feelings of depression. Description: Increasingly verbalize hopeful and positive statements regarding self, others, and the future. Target Date: 2021-06-29 Frequency: Daily Modality: individual Progress: 70%  Related Problem: Reduce fear, anxiety, and worry associated with the medical condition. Description: Share with significant others efforts to adapt successfully to the medical  condition/diagnosis. Target Date: 2021-06-29 Frequency: Daily Modality: individual Progress: 80%  Related Problem: Reduce fear, anxiety, and worry associated with the medical condition. Description: Engage in social, productive, and recreational activities that are possible in spite of medical condition. Target Date: 2021-06-29 Frequency: Daily Modality: individual Progress: 40%  Related Problem: Reduce fear, anxiety, and worry associated with the medical condition. Description: Identify the coping skills and sources of emotional support that have been beneficial in the past. Target Date: 2021-06-29 Frequency: Daily Modality: individual Progress: 50%  Client Response full compliance  Service Location Location, [name] [place], [place] [address]  Service Code cpt [phone]  Related past to present  Facilitate problem solving  Identify/label emotions  Validate/empathize  Self care activities  Lifestyle change (exercise, [name], [name], [name], [name], but her medical condition is variable and  she requests additional treatment to manage moods throughout her medical ordeal. In addition, she is trying to manage feelings associated with her older son's mental health struggles. Revised goal date is 2-23.  Patient agrees to have a video session due to the [facility] Virus. She is at home and I  am in home office.   [name] says she is disappointed in the pace of her recovery. She expects more of herself. She is having "weird" dreams of being unprepared and out of control. She recalled years in her life of feeling inadequate. As a first born, she feels she strived toward perfection. She is feeling a little bored lately and is wanting to so some volunteering. She talked about her son and his training as a [employer] scientist. She says that she needs to get herself to exercise more frequently. She is feeling "weak" and knows she needs to reconnect to an exercise routine. She needs to have more interpersonal interactions.                          [name] Time: 3:15p-4:00 p 45 minutes                      Charlotte Morel, PhD               Charlotte Morel, PhD

## 2021-06-11 NOTE — Addendum Note (Signed)
Addended by: Cassandria Anger on: 06/11/2021 07:48 AM   Modules accepted: Orders

## 2021-06-11 NOTE — Telephone Encounter (Signed)
Ok [facility] Thx 

## 2021-06-15 ENCOUNTER — Other Ambulatory Visit: Payer: Self-pay | Admitting: Internal Medicine

## 2021-06-15 ENCOUNTER — Ambulatory Visit (INDEPENDENT_AMBULATORY_CARE_PROVIDER_SITE_OTHER): Payer: Medicare Other | Admitting: Psychology

## 2021-06-15 DIAGNOSIS — F4321 Adjustment disorder with depressed mood: Secondary | ICD-10-CM | POA: Diagnosis not present

## 2021-06-15 NOTE — Progress Notes (Signed)
[name] is a 71 y.o. 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 (Status: maintained) -- No Description Entered  Diminished interest in or enjoyment of activities. (Status: maintained) -- No Description Entered  Lack of energy. (Status: maintained) -- No Description Entered  Sad affect, social withdrawal, anxiety, loss of interest in activities, and low energy. (Status: maintained) -- No Description Entered  Medication Status compliance  Safety unspecified If Suicidal or Homicidal State Action Taken: unspecified  Current Risk:  Medications Buspar (Dosage: 30mg  2X/day)  New Medication (Dosage: 250mg  2x/day)  New Medication (Dosage: 250mg  2x/day)  Prozac (Dosage: 40mg  daily)  Remeron (Dosage: 30mg  nightly)  xanax (Dosage: .5mg  2-3X/day)  Objectives Related Problem: Recognize, accept, and cope with feelings of depression. Description: Identify and replace thoughts and beliefs that support depression. Target Date: 2021-06-29 Frequency: Daily Modality: individual Progress: 70%  Related Problem: Recognize, accept, and cope with feelings of depression. Description: Verbalize an understanding of healthy and unhealthy emotions with the intent of increasing the use of healthy emotions to guide actions. Target Date: 2021-06-29 Frequency: Daily Modality: individual Progress: 70%  Related Problem: Recognize, accept, and cope with feelings of depression. Description: Increasingly verbalize hopeful and positive statements regarding self, others, and the future. Target Date: 2021-06-29 Frequency: Daily Modality: individual Progress: 70%  Related Problem: Reduce fear, anxiety, and worry associated with the medical condition. Description: Share with significant others efforts to adapt successfully to the medical  condition/diagnosis. Target Date: 2021-06-29 Frequency: Daily Modality: individual Progress: 80%  Related Problem: Reduce fear, anxiety, and worry associated with the medical condition. Description: Engage in social, productive, and recreational activities that are possible in spite of medical condition. Target Date: 2021-06-29 Frequency: Daily Modality: individual Progress: 40%  Related Problem: Reduce fear, anxiety, and worry associated with the medical condition. Description: Identify the coping skills and sources of emotional support that have been beneficial in the past. Target Date: 2021-06-29 Frequency: Daily Modality: individual Progress: 50%  Client Response full compliance  Service Location Location, [name] [place], [name]  Service Code cpt [phone]  Related past to present  Facilitate problem solving  Identify/label emotions  Validate/empathize  Self care activities  Lifestyle change (exercise, [name], [name], [name], [name], but her medical condition is variable and  she requests additional treatment to manage moods throughout her medical ordeal. In addition, she is trying to manage feelings associated with her older son's mental health struggles. Revised goal date is 2-23.  Patient agrees to have a video session due to the [facility] Virus.  She is at home and I am in home office.   [name] says that her mother is doing well and that she had a 1 1/2 hour face-time call with her. [name] is not feeling well and has no energy. She does not feel like going anywhere and is lacking motivation. She will meet with nephrologist tomorrow to evaluate her situation and determine a treatment path forward. She wanted to discuss her diagnosis of adjustment disorder. She feels it really explains her situation and mental health struggles. She has had a long series of changes/disappointments. She looked up the diagnosis and says "it is exactly the way I feel and was a relief to see it in writing. She said she had been blaming herself and now feels that there is an explanation for her symptoms. She also says it gives her hope and confidence that she can get better.                            [name] Time: 4:15p-5:00 p 45 minutes

## 2021-06-16 ENCOUNTER — Other Ambulatory Visit: Payer: Self-pay

## 2021-06-16 ENCOUNTER — Other Ambulatory Visit (INDEPENDENT_AMBULATORY_CARE_PROVIDER_SITE_OTHER): Payer: Medicare Other

## 2021-06-16 DIAGNOSIS — N390 Urinary tract infection, site not specified: Secondary | ICD-10-CM | POA: Diagnosis not present

## 2021-06-16 DIAGNOSIS — R300 Dysuria: Secondary | ICD-10-CM

## 2021-06-16 DIAGNOSIS — N179 Acute kidney failure, unspecified: Secondary | ICD-10-CM

## 2021-06-16 LAB — RENAL FUNCTION PANEL
Albumin: 4.2 g/dL (ref 3.5–5.2)
BUN: 42 mg/dL — ABNORMAL HIGH (ref 6–23)
CO2: 26 mEq/L (ref 19–32)
Calcium: 11.1 mg/dL — ABNORMAL HIGH (ref 8.4–10.5)
Chloride: 102 mEq/L (ref 96–112)
Creatinine, Ser: 1.53 mg/dL — ABNORMAL HIGH (ref 0.40–1.20)
GFR: 34.05 mL/min — ABNORMAL LOW (ref >60.00)
Glucose, Bld: 95 mg/dL (ref 70–99)
Phosphorus: 3.3 mg/dL (ref 2.3–4.6)
Potassium: 3.8 mEq/L (ref 3.5–5.1)
Sodium: 138 mEq/L (ref 135–145)

## 2021-06-16 LAB — URINALYSIS, ROUTINE W REFLEX MICROSCOPIC
Bilirubin Urine: NEGATIVE
Hgb urine dipstick: NEGATIVE
Ketones, ur: NEGATIVE
Nitrite: NEGATIVE
RBC / HPF: NONE SEEN (ref 0–?)
Specific Gravity, Urine: 1.01 (ref 1.000–1.030)
Total Protein, Urine: NEGATIVE
Urine Glucose: NEGATIVE
Urobilinogen, UA: 0.2 (ref 0.0–1.0)
pH: 6 (ref 5.0–8.0)

## 2021-06-18 DIAGNOSIS — N2581 Secondary hyperparathyroidism of renal origin: Secondary | ICD-10-CM | POA: Diagnosis not present

## 2021-06-18 DIAGNOSIS — N1832 Chronic kidney disease, stage 3b: Secondary | ICD-10-CM | POA: Diagnosis not present

## 2021-06-18 DIAGNOSIS — N3000 Acute cystitis without hematuria: Secondary | ICD-10-CM | POA: Diagnosis not present

## 2021-06-18 DIAGNOSIS — I129 Hypertensive chronic kidney disease with stage 1 through stage 4 chronic kidney disease, or unspecified chronic kidney disease: Secondary | ICD-10-CM | POA: Diagnosis not present

## 2021-06-18 DIAGNOSIS — F32A Depression, unspecified: Secondary | ICD-10-CM | POA: Diagnosis not present

## 2021-06-18 DIAGNOSIS — D631 Anemia in chronic kidney disease: Secondary | ICD-10-CM | POA: Diagnosis not present

## 2021-06-19 ENCOUNTER — Telehealth: Payer: Self-pay

## 2021-06-19 NOTE — Chronic Care Management (AMB) (Signed)
Chronic Care Management Pharmacy Assistant   Name: [name]  MRN: [id number] DOB: 24-Jul-1950  Reason for Encounter: Disease State-General     Recent office visits:  05/05/21 Hoyt Koch, MD-PCP (Bladder pain) Labs:CBC, B12, ferritin, CMP, TSH, vitamin D, U/A and urine culture, no med changes    Recent consult visits:  04/24/21-11/21/22Tucker, Corinna Lines, MD-Gynecologic/Oncology (Leiomyosarcoma of uterus) no orders or med changes   Hospital visits:  None in previous 6 months  Medications: Outpatient Encounter Medications as of 06/19/2021  Medication Sig   colesevelam (WELCHOL) 625 MG tablet TAKE 2 TABLETS (1,250 MG TOTAL) BY MOUTH 2 (TWO) TIMES DAILY WITH A MEAL.   acetaminophen (TYLENOL) 325 MG tablet Take 650 mg by mouth every 6 (six) hours as needed for moderate pain.    ALPRAZolam (XANAX) 0.5 MG tablet Take 1 tablet (0.5 mg total) by mouth 2 (two) times daily as needed for anxiety. (Patient taking differently: Take 0.5 mg by mouth at bedtime as needed for anxiety.)   aspirin EC 81 MG tablet Take 81 mg by mouth daily. (Patient not taking: Reported on 04/06/2021)   Bromelains (BROMELAIN PO) Take 750 mg by mouth daily. (Patient not taking: Reported on 04/06/2021)   busPIRone (BUSPAR) 30 MG tablet Take 30 mg by mouth 2 (two) times daily.   Cholecalciferol (VITAMIN D3) 125 MCG (5000 UT) CAPS Take 1 capsule by mouth daily.   ciprofloxacin (CIPRO) 250 MG tablet Take 1 tablet (250 mg total) by mouth daily with breakfast.   D-Mannose 500 MG CAPS Take 500 mg by mouth 2 (two) times daily. WITH CRANBERRY & DANDELION EXTRACT   esomeprazole (NEXIUM) 40 MG capsule Take 1 by mouth in the morning   estradiol (ESTRACE VAGINAL) 0.1 MG/GM vaginal cream Place 1 Applicatorful vaginally at bedtime. Place a small amount on your finger tip and insert int he vagina 3x a week at night (Patient not taking: Reported on 04/06/2021)   FERREX 150 150 MG capsule TAKE 1 CAPSULE BY MOUTH  TWICE A DAY   FLUoxetine (PROZAC) 40 MG capsule Take 1 capsule (40 mg total) by mouth every morning.   furosemide (LASIX) 20 MG tablet TAKE 1-2 TABLETS (20-40 MG TOTAL) BY MOUTH DAILY.   ipratropium-albuterol (DUONEB) 0.5-2.5 (3) MG/3ML SOLN Take 3 mLs by nebulization every 6 (six) hours as needed (Asthma).   levothyroxine (SYNTHROID) 100 MCG tablet Take 1 tablet (100 mcg total) by mouth daily before breakfast.   lisinopril (ZESTRIL) 20 MG tablet TAKE 1 (ONE) TABLET IN THE EVENING   loperamide (IMODIUM A-D) 2 MG tablet Take 2 mg by mouth 4 (four) times daily as needed for diarrhea or loose stools.   METAMUCIL FIBER PO Take 1 capsule by mouth in the morning and at bedtime.   mirtazapine (REMERON) 30 MG tablet Take 1 tablet (30 mg total) by mouth at bedtime.   Multiple Vitamins-Minerals (MULTIVITAMIN WITH MINERALS) tablet Take 1 tablet by mouth daily.   Omega-3 Fatty Acids (FISH OIL) 1000 MG CPDR Take 1,000 mg by mouth daily.   OVER THE COUNTER MEDICATION 7 MUSHROOM BLEND take 1 capsule every day   Polyethyl Glycol-Propyl Glycol (SYSTANE OP) Place 1 drop into both eyes 2 (two) times daily as needed (dry eyes).    progesterone (PROMETRIUM) 100 MG capsule Take 100 mg by mouth at bedtime.   Spirulina 500 MG TABS Take 1 tablet by mouth daily. (Patient not taking: Reported on 04/06/2021)   sulfamethoxazole-trimethoprim (BACTRIM DS) 800-160 MG tablet Take 1 tablet  by mouth 2 (two) times daily.   [DISCONTINUED] famotidine (PEPCID) 40 MG tablet TAKE 1 TABLET BY MOUTH EVERY DAY IN THE EVENING (Patient not taking: No sig reported)   No facility-administered encounter medications on file as of 06/19/2021.   Have you had any problems recently with your health?Patient states that she is not having any new health issues   Have you had any problems with your pharmacy?Patient states that she does not have any problems with getting medications or the cost of medications from the pharmacy  What issues or side  effects are you having with your medications?Patient states that she is not having any side effects from medications  What would you like me to pass along to [facility] for them to help you with? Patient stated that she would like to know from [facility] if she needs to stop taking calcium and vitamin D. She said that she was told by Dr. Carolin Sicks at [facility] not to take because of her labs. Patient states that she is taking Centrum Silver right now, but does not want to stop taking because she is afraid that she may fall and break a bone.   What can we do to take care of you better? Patient states that she does not need anything at this time  Care Gaps: Colonoscopy-05/12/16 Diabetic Foot Exam-NA Mammogram-06/15/10 Ophthalmology-NA Dexa Scan - NA Annual Well Visit - NA Micro albumin-NA Hemoglobin A1c- NA   Star Rating Drugs: Lisinopril 20 mg-last fill 05/15/21 90 ds   [facility] Pharmacist Assistant [phone]

## 2021-06-23 NOTE — Telephone Encounter (Signed)
Spoke with patient,   Calcium elevated with last lab 06/16/2021  - patient agreeable to hold of calcium and vitamin D to bring calcium level back into range, patient has follow up with nephrologist in 4 months - to repeat labs at that time

## 2021-06-23 NOTE — Addendum Note (Signed)
Addended by: Tomasa Blase on: 06/23/2021 01:42 PM   Modules accepted: Orders

## 2021-06-24 ENCOUNTER — Ambulatory Visit (INDEPENDENT_AMBULATORY_CARE_PROVIDER_SITE_OTHER): Payer: Medicare Other | Admitting: Psychology

## 2021-06-24 DIAGNOSIS — F4323 Adjustment disorder with mixed anxiety and depressed mood: Secondary | ICD-10-CM

## 2021-06-24 NOTE — Progress Notes (Signed)
[name] is a 71 y.o. 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 (Status: maintained) -- No Description Entered  Diminished interest in or enjoyment of activities. (Status: maintained) -- No Description Entered  Lack of energy. (Status: maintained) -- No Description Entered  Sad affect, social withdrawal, anxiety, loss of interest in activities, and low energy. (Status: maintained) -- No Description Entered  Medication Status compliance  Safety unspecified If Suicidal or Homicidal State Action Taken: unspecified  Current Risk:  Medications Buspar (Dosage: 30mg  2X/day)  New Medication (Dosage: 250mg  2x/day)  New Medication (Dosage: 250mg  2x/day)  Prozac (Dosage: 40mg  daily)  Remeron (Dosage: 30mg  nightly)  xanax (Dosage: .5mg  2-3X/day)  Objectives Related Problem: Recognize, accept, and cope with feelings of depression. Description: Identify and replace thoughts and beliefs that support depression. Target Date: 2021-12-27 Frequency: Daily Modality: individual Progress: 70%  Related Problem: Recognize, accept, and cope with feelings of depression. Description: Verbalize an understanding of healthy and unhealthy emotions with the intent of increasing the use of healthy emotions to guide actions. Target Date: 2021-12-27 Frequency: Daily Modality: individual Progress: 70%  Related Problem: Recognize, accept, and cope with feelings of depression. Description: Increasingly verbalize hopeful and positive statements regarding self, others, and the future. Target Date: 2021-12-27 Frequency: Daily Modality: individual Progress: 70%  Related Problem: Reduce fear, anxiety, and worry associated with the medical condition. Description: Share with significant others efforts to adapt successfully to the  medical condition/diagnosis. Target Date: 2021-12-27 Frequency: Daily Modality: individual Progress: 80%  Related Problem: Reduce fear, anxiety, and worry associated with the medical condition. Description: Engage in social, productive, and recreational activities that are possible in spite of medical condition. Target Date: 2021-12-27 Frequency: Daily Modality: individual Progress: 40%  Related Problem: Reduce fear, anxiety, and worry associated with the medical condition. Description: Identify the coping skills and sources of emotional support that have been beneficial in the past. Target Date: 2021-12-27 Frequency: Daily Modality: individual Progress: 50%  Client Response full compliance  Service Location Location, [name] [place], [place] [address]  Service Code cpt [phone]  Related past to present  Facilitate problem solving  Identify/label emotions  Validate/empathize  Self care activities  Lifestyle change (exercise, [name], [name], [name], [name], [name], she is trying to manage feelings associated with her older son's mental health struggles. Revised goal date is 70-23.  Patient agrees to have a  video session due to the [facility] Virus. She is at home and I am in home office.   [name] says she has been struggling with coming to terms with all of the bad choices she has made in her life. She feels she has tried hard to "do the right thing", but many of her efforts have failed. She is trying to put it all in perspective and feels "it is too little too late". She reflected about those things in life that are fragile and that we have no control over. She is revisiting some of her old yoga books that help her feel centered and at peace. 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 This is a good plan for her to focus on.                                [name] Time: 3:15p-4:00 p 45 minutes

## 2021-06-29 ENCOUNTER — Ambulatory Visit (INDEPENDENT_AMBULATORY_CARE_PROVIDER_SITE_OTHER): Payer: Medicare Other | Admitting: Psychology

## 2021-06-29 DIAGNOSIS — F4323 Adjustment disorder with mixed anxiety and depressed mood: Secondary | ICD-10-CM

## 2021-06-29 NOTE — Progress Notes (Signed)
[name] is a 70 y.o. 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 (Status: maintained) -- No Description Entered  Diminished interest in or enjoyment of activities. (Status: maintained) -- No Description Entered  Grant of energy. (Status: maintained) -- No Description Entered  Sad affect, social withdrawal, anxiety, loss of interest in activities, and low energy. (Status: maintained) -- No Description Entered  Medication Status compliance  Safety unspecified If Suicidal or Homicidal State Action Taken: unspecified  Current Risk:  Medications Buspar (Dosage: 66m 2X/day)  New Medication (Dosage: 2539m2x/day)  New Medication (Dosage: 2508mx/day)  Prozac (Dosage: 57m80mily)  Remeron (Dosage: 30mg6mhtly)  xanax (Dosage: .5mg 262m/day)  Objectives Related Problem: Recognize, accept, and cope with feelings of depression. Description: Identify and replace thoughts and beliefs that support depression. Target Date: 2021-12-27 Frequency: Daily Modality: individual Progress: 70%  Related Problem: Recognize, accept, and cope with feelings of depression. Description: Verbalize an understanding of healthy and unhealthy emotions with the intent of increasing the use of healthy emotions to guide actions. Target Date: 2021-12-27 Frequency: Daily Modality: individual Progress: 70%  Related Problem: Recognize, accept, and cope with feelings of depression. Description: Increasingly verbalize hopeful and positive statements regarding self, others, and the future. Target Date: 2021-12-27 Frequency: Daily Modality: individual Progress: 70%  Related Problem: Reduce fear, anxiety, and worry associated with the medical condition. Description: Share with significant others efforts to adapt successfully to  the medical condition/diagnosis. Target Date: 2021-12-27 Frequency: Daily Modality: individual Progress: 80%  Related Problem: Reduce fear, anxiety, and worry associated with the medical condition. Description: Engage in social, productive, and recreational activities that are possible in spite of medical condition. Target Date: 2021-12-27 Frequency: Daily Modality: individual Progress: 40%  Related Problem: Reduce fear, anxiety, and worry associated with the medical condition. Description: Identify the coping skills and sources of emotional support that have been beneficial in the past. Target Date: 2021-12-27 Frequency: Daily Modality: individual Progress: 50%  Client Response full compliance  Service Location Location, [address] [id number] Code cpt [address] [phone] past to present  Facilitate problem solving  Identify/label emotions  Validate/empathize  Self care activities  Lifestyle change (exercise, [name], [name], [name], [name], [name], she is trying to manage feelings associated with her older son's mental health struggles. Revised goal date is 8-23. 75-23ient agrees  to have a video session due to the [facility] Virus. She is at home and I am in home office.   [name] says she has little motivation to leave home and has no energy. She has not met with her doctor's recently to discuss. Told her she needs to discuss this with her physician and that her depression may also be a factor. She will call her primary care doctor to get an appointment. She wakes up "every morning" with nausea and headache. She talked about her upset that her sons do not get along. [name] "seems like" he does not want to get together with is older brother [name]. We talked about [name] getting out of the middle. She agrees that she needs to work on "staying in her own lane". She says "it does not break my heart, but it disappoints me". Acknowledges all she can do is "hope" it will change. Her Grant of motivation is based on feeling it is "not safe" to leave home. She says it is, in part, a function of being home for 3 years. She is reluctant to share how she really feels with friends. Will try to open up more, but does not want to be identified with negativity.                                      [name] Time: 10:45a-11:30 p 45 minutes

## 2021-07-06 ENCOUNTER — Ambulatory Visit (INDEPENDENT_AMBULATORY_CARE_PROVIDER_SITE_OTHER): Payer: Medicare Other | Admitting: Internal Medicine

## 2021-07-06 ENCOUNTER — Other Ambulatory Visit: Payer: Self-pay

## 2021-07-06 ENCOUNTER — Encounter: Payer: Self-pay | Admitting: Internal Medicine

## 2021-07-06 DIAGNOSIS — G9332 Myalgic encephalomyelitis/chronic fatigue syndrome: Secondary | ICD-10-CM

## 2021-07-06 DIAGNOSIS — R197 Diarrhea, unspecified: Secondary | ICD-10-CM | POA: Diagnosis not present

## 2021-07-06 DIAGNOSIS — R5383 Other fatigue: Secondary | ICD-10-CM

## 2021-07-06 DIAGNOSIS — I959 Hypotension, unspecified: Secondary | ICD-10-CM | POA: Diagnosis not present

## 2021-07-06 DIAGNOSIS — C499 Malignant neoplasm of connective and soft tissue, unspecified: Secondary | ICD-10-CM | POA: Diagnosis not present

## 2021-07-06 DIAGNOSIS — N1832 Chronic kidney disease, stage 3b: Secondary | ICD-10-CM | POA: Diagnosis not present

## 2021-07-06 DIAGNOSIS — Z8616 Personal history of COVID-19: Secondary | ICD-10-CM | POA: Diagnosis not present

## 2021-07-06 DIAGNOSIS — E559 Vitamin D deficiency, unspecified: Secondary | ICD-10-CM

## 2021-07-06 DIAGNOSIS — R5381 Other malaise: Secondary | ICD-10-CM | POA: Diagnosis not present

## 2021-07-06 MED ORDER — METHYLPHENIDATE HCL 5 MG PO TABS
5.0000 mg | ORAL_TABLET | Freq: Two times a day (BID) | ORAL | 0 refills | Status: DC
Start: 2021-07-06 — End: 2021-10-02

## 2021-07-06 MED ORDER — CIPROFLOXACIN HCL 250 MG PO TABS: 250.0000 mg | ORAL_TABLET | Freq: Every day | ORAL | 1 refills | Status: DC

## 2021-07-06 MED ORDER — DIPHENOXYLATE-ATROPINE 2.5-0.025 MG PO TABS: 1.0000 | ORAL_TABLET | Freq: Four times a day (QID) | ORAL | 1 refills | Status: DC | PRN

## 2021-07-06 NOTE — Assessment & Plan Note (Signed)
Reduce or stop Lisinopril

## 2021-07-06 NOTE — Assessment & Plan Note (Signed)
Worse 

## 2021-07-06 NOTE — Assessment & Plan Note (Signed)
Chronic diarrhea all the time post-XRT. See Rx

## 2021-07-06 NOTE — Progress Notes (Signed)
Subjective:  Patient ID: [name], female    DOB: Jun 14, 1950  Age: 71 y.o. MRN: [id number]  CC: Fatigue (Fatigue getting worst)   HPI [name] presents for CRI, UTIs, anxiety. C/o post- COVID fatigue, weakness, brain fog since Dec 2022. 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  Outpatient Medications Prior to Visit  Medication Sig Dispense Refill   colesevelam (WELCHOL) 625 MG tablet TAKE 2 TABLETS (1,250 MG TOTAL) BY MOUTH 2 (TWO) TIMES DAILY WITH A MEAL. 360 tablet 1   traMADol (ULTRAM) 50 MG tablet Take 50 mg by mouth every 6 (six) hours as needed.     acetaminophen (TYLENOL) 325 MG tablet Take 650 mg by mouth every 6 (six) hours as needed for moderate pain.      ALPRAZolam (XANAX) 0.5 MG tablet Take 1 tablet (0.5 mg total) by mouth 2 (two) times daily as needed for anxiety. (Patient taking differently: Take 0.5 mg by mouth at bedtime as needed for anxiety.) 60 tablet 1   aspirin EC 81 MG tablet Take 81 mg by mouth daily. (Patient not taking: Reported on 04/06/2021)     Bromelains (BROMELAIN PO) Take 750 mg by mouth daily. (Patient not taking: Reported on 04/06/2021)     busPIRone (BUSPAR) 30 MG tablet Take 30 mg by mouth 2 (two) times daily.     D-Mannose 500 MG CAPS Take 500 mg by mouth 2 (two) times daily. WITH CRANBERRY & DANDELION EXTRACT     esomeprazole (NEXIUM) 40 MG capsule Take 1 by mouth in the morning     estradiol (ESTRACE VAGINAL) 0.1 MG/GM vaginal cream Place 1 Applicatorful vaginally at bedtime. Place a small amount on your finger tip and insert int he vagina 3x a week at night (Patient not taking: Reported on 04/06/2021) 42.5 g 12   FERREX 150 150 MG capsule TAKE 1 CAPSULE BY MOUTH TWICE A DAY 180 capsule 3   FLUoxetine (PROZAC) 40 MG capsule Take 1 capsule (40 mg total) by mouth every morning. 90 capsule 1   furosemide (LASIX) 20 MG tablet TAKE 1-2 TABLETS (20-40 MG TOTAL) BY MOUTH DAILY. 180 tablet 1   ipratropium-albuterol (DUONEB) 0.5-2.5  (3) MG/3ML SOLN Take 3 mLs by nebulization every 6 (six) hours as needed (Asthma).     levothyroxine (SYNTHROID) 100 MCG tablet Take 1 tablet (100 mcg total) by mouth daily before breakfast. 90 tablet 3   lisinopril (ZESTRIL) 20 MG tablet TAKE 1 (ONE) TABLET IN THE EVENING 90 tablet 2   loperamide (IMODIUM A-D) 2 MG tablet Take 2 mg by mouth 4 (four) times daily as needed for diarrhea or loose stools.     METAMUCIL FIBER PO Take 1 capsule by mouth in the morning and at bedtime.     mirtazapine (REMERON) 30 MG tablet Take 1 tablet (30 mg total) by mouth at bedtime. 90 tablet 1   Omega-3 Fatty Acids (FISH OIL) 1000 MG CPDR Take 1,000 mg by mouth daily.     OVER THE COUNTER MEDICATION 7 MUSHROOM BLEND take 1 capsule every day     Polyethyl Glycol-Propyl Glycol (SYSTANE OP) Place 1 drop into both eyes 2 (two) times daily as needed (dry eyes).      Spirulina 500 MG TABS Take 1 tablet by mouth daily. (Patient not taking: Reported on 04/06/2021)     ciprofloxacin (CIPRO) 250 MG tablet Take 1 tablet (250 mg total) by mouth daily with breakfast. 10 tablet 1   Multiple Vitamins-Minerals (MULTIVITAMIN WITH MINERALS)  tablet Take 1 tablet by mouth daily. (Patient not taking: Reported on 07/06/2021)     progesterone (PROMETRIUM) 100 MG capsule Take 100 mg by mouth at bedtime.     sulfamethoxazole-trimethoprim (BACTRIM DS) 800-160 MG tablet Take 1 tablet by mouth 2 (two) times daily. 14 tablet 0   No facility-administered medications prior to visit.    ROS: Review of Systems  Constitutional:  Positive for fatigue. Negative for activity change, appetite change, chills and unexpected weight change.  HENT:  Negative for congestion, mouth sores and sinus pressure.   Eyes:  Negative for visual disturbance.  Respiratory:  Negative for cough and chest tightness.   Gastrointestinal:  Positive for diarrhea and nausea. Negative for abdominal pain.  Genitourinary:  Negative for difficulty urinating, frequency and  vaginal pain.  Musculoskeletal:  Positive for arthralgias. Negative for back pain and gait problem.  Skin:  Negative for pallor and rash.  Neurological:  Positive for dizziness, weakness and light-headedness. Negative for tremors, numbness and headaches.  Psychiatric/Behavioral:  Positive for decreased concentration and sleep disturbance. Negative for confusion and suicidal ideas. The patient is nervous/anxious.    Objective:  BP 118/80    Pulse 80    Temp 98.2 F (36.8 C) (Oral)    Ht 5\' 2"  (1.575 m)    Wt 163 lb 8 oz (74.2 kg)    SpO2 98%    BMI 29.90 kg/m   BP Readings from Last 3 Encounters:  07/06/21 118/80  04/24/21 125/76  03/09/21 122/80    Wt Readings from Last 3 Encounters:  07/06/21 163 lb 8 oz (74.2 kg)  04/24/21 161 lb (73 kg)  03/09/21 162 lb 6.4 oz (73.7 kg)    Physical Exam Constitutional:      General: She is not in acute distress.    Appearance: She is well-developed. She is obese.  HENT:     Head: Normocephalic.     Right Ear: External ear normal.     Left Ear: External ear normal.     Nose: Nose normal.  Eyes:     General:        Right eye: No discharge.        Left eye: No discharge.     Conjunctiva/sclera: Conjunctivae normal.     Pupils: Pupils are equal, round, and reactive to light.  Neck:     Thyroid: No thyromegaly.     Vascular: No JVD.     Trachea: No tracheal deviation.  Cardiovascular:     Rate and Rhythm: Normal rate and regular rhythm.     Heart sounds: Normal heart sounds.  Pulmonary:     Effort: No respiratory distress.     Breath sounds: No stridor. No wheezing.  Abdominal:     General: Bowel sounds are normal. There is no distension.     Palpations: Abdomen is soft. There is no mass.     Tenderness: There is no abdominal tenderness. There is no guarding or rebound.  Musculoskeletal:        General: Tenderness present.     Cervical back: Normal range of motion and neck supple. No rigidity.  Lymphadenopathy:     Cervical: No  cervical adenopathy.  Skin:    Findings: No erythema or rash.  Neurological:     Mental Status: She is oriented to person, place, and time.     Cranial Nerves: No cranial nerve deficit.     Motor: No abnormal muscle tone.     Coordination: Coordination normal.  Deep Tendon Reflexes: Reflexes normal.  Psychiatric:        Behavior: Behavior normal.        Thought Content: Thought content normal.        Judgment: Judgment normal.     A total time of 45 minutes was spent preparing to see the patient, reviewing tests, x-rays, operative reports and other medical records.  Also, obtaining history and performing comprehensive physical exam.  Additionally, counseling the patient regarding the above listed issues - diarrhea, CFS worsening, post -COVID fatigue.   Finally, documenting clinical information in the health records, coordination of care, educating the patient. It is a complex case.  Lab Results  Component Value Date   WBC 7.3 05/07/2021   HGB 12.4 05/07/2021   HCT 37.1 05/07/2021   PLT 421.0 (H) 05/07/2021   GLUCOSE 95 06/16/2021   CHOL 263 (H) 10/18/2018   TRIG 271.0 (H) 10/18/2018   HDL 45.40 10/18/2018   LDLDIRECT 160.0 10/18/2018   LDLCALC UNABLE TO CALCULATE IF TRIGLYCERIDE OVER 400 mg/dL 08/26/2017   ALT 22 05/07/2021   AST 27 05/07/2021   NA 138 06/16/2021   K 3.8 06/16/2021   CL 102 06/16/2021   CREATININE 1.53 (H) 06/16/2021   BUN 42 (H) 06/16/2021   CO2 26 06/16/2021   TSH 0.88 05/07/2021   INR 1.0 07/15/2020   HGBA1C 5.5 06/27/2017    CT Abdomen Pelvis W Contrast  Result Date: 02/26/2021 CLINICAL DATA:  Abdominal distension and pain. History of leiomyosarcoma EXAM: CT ABDOMEN AND PELVIS WITH CONTRAST TECHNIQUE: Multidetector CT imaging of the abdomen and pelvis was performed using the standard protocol following bolus administration of intravenous contrast. CONTRAST:  26mL OMNIPAQUE IOHEXOL 350 MG/ML SOLN COMPARISON:  12/26/2020. FINDINGS: Lower chest:  Stable scarring right lung base. Hepatobiliary: No suspicious focal abnormality within the liver parenchyma. Gallbladder surgically absent. No intrahepatic or extrahepatic biliary dilation. Pancreas: No focal mass lesion. No dilatation of the main duct. No intraparenchymal cyst. No peripancreatic edema. Spleen: No splenomegaly. Adrenals/Urinary Tract: No adrenal nodule or mass. Kidneys unremarkable. No evidence for hydroureter. Trace amount a gas in the bladder lumen is presumably secondary to recent instrumentation. Stomach/Bowel: Stomach is unremarkable. No gastric wall thickening. No evidence of outlet obstruction. Duodenum is normally positioned as is the ligament of Treitz. Duodenal diverticulum noted. No small bowel wall thickening. No small bowel dilatation. The terminal ileum is normal. The appendix is normal. No gross colonic mass. No colonic wall thickening. Vascular/Lymphatic: There is mild atherosclerotic calcification of the abdominal aorta without aneurysm. There is no gastrohepatic or hepatoduodenal ligament lymphadenopathy. No retroperitoneal or mesenteric lymphadenopathy. No pelvic sidewall lymphadenopathy. Reproductive: Hysterectomy.  There is no adnexal mass. Other: No intraperitoneal free fluid. Musculoskeletal: No worrisome lytic or sclerotic osseous abnormality. IMPRESSION: 1. No acute findings in the abdomen or pelvis. Specifically, no findings to explain the patient's history of abdominal distension. 2. Trace amount a gas in the bladder lumen is presumably secondary to recent instrumentation. In the absence of recent instrumentation, bladder infection would be a distinct consideration. 3. Aortic Atherosclerosis (ICD10-I70.0). Electronically Signed   By: Misty Stanley M.D.   On: 02/26/2021 18:06    Assessment & Plan:   Problem List Items Addressed This Visit     Chronic fatigue syndrome    Worse      CKD (chronic kidney disease), stage III ([facility])    F/u w/Dr Carolin Sicks Off Calcium  and Vit D due to hypercalcemia      Diarrhea  Worse after XRT Try Welchol,  Lomotil prn Stool for Cdiff      Relevant Orders   Clostridium Difficile by PCR([facility])   History of COVID-19    Post-COVID fatigue      Leiomyosarcoma (HCC)    Chronic diarrhea all the time post-XRT. See Rx      Relevant Medications   ciprofloxacin (CIPRO) 250 MG tablet   Low BP    Reduce or stop Lisinopril      Malaise and fatigue    Worse post-COVID since Dec 2022 Reduce Lisinopril or d/c Will try low dose Ritalin       Vitamin D deficiency      Meds ordered this encounter  Medications   ciprofloxacin (CIPRO) 250 MG tablet    Sig: Take 1 tablet (250 mg total) by mouth daily with breakfast.    Dispense:  10 tablet    Refill:  1   methylphenidate (RITALIN) 5 MG tablet    Sig: Take 1 tablet (5 mg total) by mouth 2 (two) times daily with breakfast and lunch.    Dispense:  60 tablet    Refill:  0   diphenoxylate-atropine (LOMOTIL) 2.5-0.025 MG tablet    Sig: Take 1 tablet by mouth 4 (four) times daily as needed for diarrhea or loose stools.    Dispense:  60 tablet    Refill:  1      Follow-up: Return in about 4 weeks (around 08/03/2021) for a follow-up visit.  Walker Kehr, MD

## 2021-07-06 NOTE — Patient Instructions (Signed)
Reduce or stop Lisinopril

## 2021-07-06 NOTE — Assessment & Plan Note (Addendum)
Worse post-COVID since Dec 2022 Reduce Lisinopril or d/c Will try low dose Ritalin

## 2021-07-06 NOTE — Assessment & Plan Note (Signed)
Post COVID fatigue 

## 2021-07-06 NOTE — Assessment & Plan Note (Signed)
F/u w/Dr Carolin Sicks Off Calcium and Vit D due to hypercalcemia

## 2021-07-06 NOTE — Assessment & Plan Note (Signed)
Worse after XRT Try Welchol,  Lomotil prn Stool for Cdiff

## 2021-07-08 ENCOUNTER — Ambulatory Visit (INDEPENDENT_AMBULATORY_CARE_PROVIDER_SITE_OTHER): Payer: Medicare Other | Admitting: Psychology

## 2021-07-08 DIAGNOSIS — F4323 Adjustment disorder with mixed anxiety and depressed mood: Secondary | ICD-10-CM

## 2021-07-08 NOTE — Progress Notes (Signed)
[name] is a 71 y.o. 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 (Status: maintained) -- No Description Entered  Diminished interest in or enjoyment of activities. (Status: maintained) -- No Description Entered  Lack of energy. (Status: maintained) -- No Description Entered  Sad affect, social withdrawal, anxiety, loss of interest in activities, and low energy. (Status: maintained) -- No Description Entered  Medication Status compliance  Safety unspecified If Suicidal or Homicidal State Action Taken: unspecified  Current Risk:  Medications Buspar (Dosage: 31m 2X/day)  New Medication (Dosage: 2558m2x/day)  New Medication (Dosage: 25072mx/day)  Prozac (Dosage: 39m36mily)  Remeron (Dosage: 30mg25mhtly)  xanax (Dosage: .5mg 213m/day)  Objectives Related Problem: Recognize, accept, and cope with feelings of depression. Description: Identify and replace thoughts and beliefs that support depression. Target Date: 2021-12-27 Frequency: Daily Modality: individual Progress: 70%  Related Problem: Recognize, accept, and cope with feelings of depression. Description: Verbalize an understanding of healthy and unhealthy emotions with the intent of increasing the use of healthy emotions to guide actions. Target Date: 2021-12-27 Frequency: Daily Modality: individual Progress: 70%  Related Problem: Recognize, accept, and cope with feelings of depression. Description: Increasingly verbalize hopeful and positive statements regarding self, others, and the future. Target Date: 2021-12-27 Frequency: Daily Modality: individual Progress: 70%  Related Problem: Reduce fear, anxiety, and worry associated with the medical condition. Description: Share with significant others efforts to adapt successfully to the medical  condition/diagnosis. Target Date: 2021-12-27 Frequency: Daily Modality: individual Progress: 80%  Related Problem: Reduce fear, anxiety, and worry associated with the medical condition. Description: Engage in social, productive, and recreational activities that are possible in spite of medical condition. Target Date: 2021-12-27 Frequency: Daily Modality: individual Progress: 40%  Related Problem: Reduce fear, anxiety, and worry associated with the medical condition. Description: Identify the coping skills and sources of emotional support that have been beneficial in the past. Target Date: 2021-12-27 Frequency: Daily Modality: individual Progress: 50%  Client Response full compliance  Service [place] [address] [id number] Code cpt [address] [phone] past to present  Facilitate problem solving  Identify/label emotions  Validate/empathize  Self care activities  Lifestyle change (exercise, [name], [name], [name], [name], but her medical condition is variable and  she requests additional treatment to manage moods throughout her medical ordeal. In addition, she is trying to manage feelings associated with her older son's mental health struggles. Revised goal date is 8-23. 82-23ient agrees to have a video session due to the  Corona Virus. She is at home and I am in home office.   [name] says she has another UTI. She met with Dr. Alain Grant to review a number of her physical complaints. She is considering seeking consultation from some alternative providers to see if they have another approach. Dr. Bishop Grant feels she should try Adderall because she presents symptoms consistent with ADHD. She is trying to be patient, but it has been a long period of feeling terrible.  Reports that her son tells her he is doing well. She is hoping this is a positive trend. We talked about the importance of establishing and maintaining appropriate boundaries.                                     [name] [name] Time: 3:15p-4:00 p 45 minutes

## 2021-07-13 ENCOUNTER — Ambulatory Visit (INDEPENDENT_AMBULATORY_CARE_PROVIDER_SITE_OTHER): Payer: Medicare Other | Admitting: Psychology

## 2021-07-13 DIAGNOSIS — F4323 Adjustment disorder with mixed anxiety and depressed mood: Secondary | ICD-10-CM | POA: Diagnosis not present

## 2021-07-13 NOTE — Progress Notes (Signed)
[name] is a 71 y.o. female patient  Date: 07/13/2021  Treatment Plan Diagnosis F43.21 (Adjustment Disorder, With depressed mood) [n/a]  Symptoms A diagnosis of an acute, serious illness that is life-threatening. (Status: maintained) -- No Description Entered  Diminished interest in or enjoyment of activities. (Status: maintained) -- No Description Entered  Lack of energy. (Status: maintained) -- No Description Entered  Sad affect, social withdrawal, anxiety, loss of interest in activities, and low energy. (Status: maintained) -- No Description Entered  Medication Status compliance  Safety unspecified If Suicidal or Homicidal State Action Taken: unspecified  Current Risk:  Medications Buspar (Dosage: 30mg  2X/day)  New Medication (Dosage: 250mg  2x/day)  New Medication (Dosage: 250mg  2x/day)  Prozac (Dosage: 40mg  daily)  Remeron (Dosage: 30mg  nightly)  xanax (Dosage: .5mg  2-3X/day)  Objectives Related Problem: Recognize, accept, and cope with feelings of depression. Description: Identify and replace thoughts and beliefs that support depression. Target Date: 2021-12-27 Frequency: Daily Modality: individual Progress: 70%  Related Problem: Recognize, accept, and cope with feelings of depression. Description: Verbalize an understanding of healthy and unhealthy emotions with the intent of increasing the use of healthy emotions to guide actions. Target Date: 2021-12-27 Frequency: Daily Modality: individual Progress: 70%  Related Problem: Recognize, accept, and cope with feelings of depression. Description: Increasingly verbalize hopeful and positive statements regarding self, others, and the future. Target Date: 2021-12-27 Frequency: Daily Modality: individual Progress: 70%  Related Problem: Reduce fear, anxiety, and worry associated with the medical condition. Description: Share with significant others efforts to adapt  successfully to the medical condition/diagnosis. Target Date: 2021-12-27 Frequency: Daily Modality: individual Progress: 80%  Related Problem: Reduce fear, anxiety, and worry associated with the medical condition. Description: Engage in social, productive, and recreational activities that are possible in spite of medical condition. Target Date: 2021-12-27 Frequency: Daily Modality: individual Progress: 40%  Related Problem: Reduce fear, anxiety, and worry associated with the medical condition. Description: Identify the coping skills and sources of emotional support that have been beneficial in the past. Target Date: 2021-12-27 Frequency: Daily Modality: individual Progress: 50%  Client Response full compliance  Service Location Location, [name] [place], [place] [address]  Service Code cpt [phone] P Related past to present  Facilitate problem solving  Identify/label emotions  Validate/empathize  Self care activities  Lifestyle change (exercise, [name], [name], [name], [name], but her  medical condition is variable and she requests additional treatment to manage moods throughout her medical ordeal. In addition, she is trying to manage feelings associated with her older son's mental health struggles. Revised  goal date is 25-23.  Patient agrees to have a video session due to the [facility] Virus. She is at home and I am in home office.   [name] is not feeling well. She still has the UTI, but says she is medicated and believes she is "turning the corner". She sees her Oncologist next week and has a number of questions. She is chronically fatigued, which has contributed to depressive feelings. She did out out of the house this past week to go to her church. It was the first time she had been to church in three years and she felt good about seeing people. She wore a mask and was cautious.Hope to continue to go back again soon. She saw her son and said "he doesn't look great". But, she was glad he appeared sober. She felt he had an "uncomfortable attitude", but has "turned it over to g-d". Attitude is strong and less pessimistic, but it is getting difficult to stay motivated.                                         [name] Time: 4:15p-5:00 p 45 minutes

## 2021-07-14 ENCOUNTER — Telehealth: Payer: Self-pay

## 2021-07-14 ENCOUNTER — Other Ambulatory Visit: Payer: Self-pay

## 2021-07-14 MED ORDER — ESOMEPRAZOLE MAGNESIUM 40 MG PO CPDR: 40.0000 mg | DELAYED_RELEASE_CAPSULE | Freq: Every day | ORAL | 3 refills | Status: DC

## 2021-07-14 MED ORDER — ACETAMINOPHEN 325 MG PO TABS: 650.0000 mg | ORAL_TABLET | Freq: Four times a day (QID) | ORAL | 2 refills | Status: DC | PRN

## 2021-07-14 MED ORDER — BUSPIRONE HCL 30 MG PO TABS: 30.0000 mg | ORAL_TABLET | Freq: Two times a day (BID) | ORAL | 2 refills | Status: DC

## 2021-07-14 MED ORDER — FISH OIL 1000 MG PO CPDR: 1000.0000 mg | DELAYED_RELEASE_CAPSULE | Freq: Every day | ORAL | 3 refills | Status: DC

## 2021-07-14 MED ORDER — MIRTAZAPINE 30 MG PO TABS: 30.0000 mg | ORAL_TABLET | Freq: Every day | ORAL | 1 refills | Status: DC

## 2021-07-14 MED ORDER — POLYSACCHARIDE IRON COMPLEX 150 MG PO CAPS: 150.0000 mg | ORAL_CAPSULE | Freq: Two times a day (BID) | ORAL | 3 refills | Status: DC

## 2021-07-14 MED ORDER — LEVOTHYROXINE SODIUM 100 MCG PO TABS: 100.0000 ug | ORAL_TABLET | Freq: Every day | ORAL | 3 refills | Status: DC

## 2021-07-14 MED ORDER — ASPIRIN EC 81 MG PO TBEC: 81.0000 mg | DELAYED_RELEASE_TABLET | Freq: Every day | ORAL | 3 refills | Status: DC

## 2021-07-14 MED ORDER — FUROSEMIDE 20 MG PO TABS: 20.0000 mg | ORAL_TABLET | Freq: Every day | ORAL | 1 refills | Status: DC

## 2021-07-14 NOTE — Telephone Encounter (Signed)
Pt is requesting med refill for ALPRAZolam (XANAX) 0.5 MG tablet and FLUoxetine (PROZAC) 40 MG capsule

## 2021-07-22 ENCOUNTER — Ambulatory Visit: Payer: Medicare Other | Admitting: Psychology

## 2021-07-23 ENCOUNTER — Encounter: Payer: Self-pay | Admitting: Gynecologic Oncology

## 2021-07-24 ENCOUNTER — Encounter: Payer: Self-pay | Admitting: Gynecologic Oncology

## 2021-07-24 ENCOUNTER — Inpatient Hospital Stay: Payer: Medicare Other

## 2021-07-24 ENCOUNTER — Other Ambulatory Visit: Payer: Self-pay

## 2021-07-24 ENCOUNTER — Inpatient Hospital Stay: Payer: Medicare Other | Attending: Gynecologic Oncology | Admitting: Gynecologic Oncology

## 2021-07-24 VITALS — BP 125/73 | HR 93 | Temp 98.3°F | Resp 14 | Ht 63.78 in | Wt 161.0 lb

## 2021-07-24 DIAGNOSIS — Z823 Family history of stroke: Secondary | ICD-10-CM | POA: Diagnosis not present

## 2021-07-24 DIAGNOSIS — R5383 Other fatigue: Secondary | ICD-10-CM

## 2021-07-24 DIAGNOSIS — Z8542 Personal history of malignant neoplasm of other parts of uterus: Secondary | ICD-10-CM | POA: Diagnosis present

## 2021-07-24 DIAGNOSIS — Z902 Acquired absence of lung [part of]: Secondary | ICD-10-CM | POA: Insufficient documentation

## 2021-07-24 DIAGNOSIS — Z90710 Acquired absence of both cervix and uterus: Secondary | ICD-10-CM | POA: Insufficient documentation

## 2021-07-24 DIAGNOSIS — Z90722 Acquired absence of ovaries, bilateral: Secondary | ICD-10-CM | POA: Diagnosis not present

## 2021-07-24 DIAGNOSIS — Z9221 Personal history of antineoplastic chemotherapy: Secondary | ICD-10-CM | POA: Diagnosis not present

## 2021-07-24 DIAGNOSIS — Z85118 Personal history of other malignant neoplasm of bronchus and lung: Secondary | ICD-10-CM | POA: Insufficient documentation

## 2021-07-24 DIAGNOSIS — C55 Malignant neoplasm of uterus, part unspecified: Secondary | ICD-10-CM

## 2021-07-24 NOTE — Patient Instructions (Signed)
It was wonderful to see you today.  I am glad that you are starting to feel better.  I will see you back in 3 months.  I will either send you a message in MyChart or call you when I get your CT results back.  As always, if you develop new symptoms before your next visit, please call to see me sooner. ?

## 2021-07-24 NOTE — Progress Notes (Signed)
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 improvement since her last visit with me.  She continues to endorse fatigue although was recently started on Ritalin for what is thought to be ADHD.  She has been on medication for about a week and has noted some improvement in both her energy but also her ability to focus and complete tasks.   She is using vaginal estrogen intermittently in the setting of her recurrent urinary tract infections.  Recently was treated for an infection around the holidays.   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  She is continued on her progesterone.    It has been almost 6 months since her last imaging.  We will plan to repeat imaging given her high risk disease.  I ordered a CT chest, abdomen and pelvis.  Plan for surveillance visit in 3 months unless she develops new and concerning symptoms.  32 minutes of total time was spent for this patient encounter, including preparation, face-to-face counseling with the patient and coordination of care, and documentation of the encounter.  Jeral Pinch, MD  Division of Gynecologic Oncology  Department of Obstetrics and Gynecology  [facility]

## 2021-07-27 ENCOUNTER — Ambulatory Visit (INDEPENDENT_AMBULATORY_CARE_PROVIDER_SITE_OTHER): Payer: Medicare Other | Admitting: Psychology

## 2021-07-27 ENCOUNTER — Other Ambulatory Visit: Payer: Self-pay

## 2021-07-27 ENCOUNTER — Telehealth: Payer: Self-pay

## 2021-07-27 ENCOUNTER — Inpatient Hospital Stay: Payer: Medicare Other

## 2021-07-27 DIAGNOSIS — F4323 Adjustment disorder with mixed anxiety and depressed mood: Secondary | ICD-10-CM | POA: Diagnosis not present

## 2021-07-27 DIAGNOSIS — N1832 Chronic kidney disease, stage 3b: Secondary | ICD-10-CM | POA: Diagnosis not present

## 2021-07-27 MED ORDER — FLUOXETINE HCL 40 MG PO CAPS: 40.0000 mg | ORAL_CAPSULE | Freq: Every morning | ORAL | 1 refills | Status: DC

## 2021-07-27 NOTE — Telephone Encounter (Signed)
Pt is stating that she is concerned about the the elevated Blood calcium level 11.5 affecting her BP. Pt is requesting a referral to ENDO. ? ?Pt is wanting a call back [phone] ?

## 2021-07-27 NOTE — Progress Notes (Signed)
? ? ? ? ? ? ? ? ? ? ? ? ? ? ? ? ? ? ? ? ? ? ?[name] is a 71 y.o. 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 (Status: maintained) -- No Description Entered  ?Diminished interest in or enjoyment of activities. (Status: maintained) -- No Description Entered  ?Lack of energy. (Status: maintained) -- No Description Entered  ?Sad affect, social withdrawal, anxiety, loss of interest in activities, and low energy. (Status: maintained) -- No Description Entered  ?Medication Status ?compliance  ?Safety ?unspecified ?If Suicidal or Homicidal State Action Taken: unspecified  ?Current Risk:  ?Medications ?Buspar (Dosage: 30mg  2X/day)  ?New Medication (Dosage: 250mg  2x/day)  ?New Medication (Dosage: 250mg  2x/day)  ?Prozac (Dosage: 40mg  daily)  ?Remeron (Dosage: 30mg  nightly)  ?xanax (Dosage: .5mg  2-3X/day)  ?Objectives ?Related Problem: Recognize, accept, and cope with feelings of depression. ?Description: Identify and replace thoughts and beliefs that support depression. ?Target Date: 2021-12-27 ?Frequency: Daily ?Modality: individual ?Progress: 70%  ?Related Problem: Recognize, accept, and cope with feelings of depression. ?Description: Verbalize an understanding of healthy and unhealthy emotions with the intent of increasing the use of healthy emotions to guide actions. ?Target Date: 2021-12-27 ?Frequency: Daily ?Modality: individual ?Progress: 70%  ?Related Problem: Recognize, accept, and cope with feelings of depression. ?Description: Increasingly verbalize hopeful and positive statements regarding self, others, and the future. ?Target Date: 2021-12-27 ?Frequency: Daily ?Modality: individual ?Progress: 70% ? ?Related Problem: Reduce fear, anxiety, and worry associated with the medical condition. ?Description: Share with significant others efforts to adapt  successfully to the medical condition/diagnosis. ?Target Date: 2021-12-27 ?Frequency: Daily ?Modality: individual ?Progress: 80%  ?Related Problem: Reduce fear, anxiety, and worry associated with the medical condition. ?Description: Engage in social, productive, and recreational activities that are possible in spite of medical condition. ?Target Date: 2021-12-27 ?Frequency: Daily ?Modality: individual ?Progress: 40% ? ?Related Problem: Reduce fear, anxiety, and worry associated with the medical condition. ?Description: Identify the coping skills and sources of emotional support that have been beneficial in the past. ?Target Date: 2021-12-27 ?Frequency: Daily ?Modality: individual ?Progress: 50%  ?Client Response ?full compliance  ?Service Location ?Location, [name] [place],  [address]  ?Service Code ?cpt [id number] P ?Related past to present  ?Facilitate problem solving  ?Identify/label emotions  ?Validate/empathize  ?Self care activities  ?Lifestyle change (exercise, nutrition)  ?Assess/facilitate readiness to change  ?Relaxation training  ?Rationally challenge thoughts or beliefs/cognitive restructuring  ?Session notes: ?Dx: T51.76  ?Meds: Prozac, Buspar and Remeron.  ?Goals: Following the failure of a third marriage, [name], [name], but her  medical condition is variable and she requests additional treatment to manage moods throughout her medical ordeal. In addition, she is trying to manage feelings associated with her older son's mental health struggles. Revised goal  date is 28-23.  ?Patient agrees to have a video session due to the [facility] Virus. She is at home and I am in home office.  ? ?[name] has been having more medical problems, including high blood pressure. She has abnormal parathyroid and is optimistic it will be resolved. ?Says her son [name] has not been in touch for 2 weeks and she is not sure if he is okay. He will not tell her where he is working because he fears that she will just stop by. Historically, it is not a good sign when he is not communicating. She is able to recognize she is limited in what she can do and is willing to accept those limitations.   ?   ?                                  ? ? ?[name] Time: 4:15p-5:00 p 45 minutes  ? ? ? ? ? ? ? ? ? ? ? ? ? ? ? ? ? ? ? ? ? ? ? ? ? ? ? ? ? ? ? ? ? ? ? ? ? ? ? ? ? ? ? ? ? ? ? ? ? ?

## 2021-07-29 ENCOUNTER — Ambulatory Visit (INDEPENDENT_AMBULATORY_CARE_PROVIDER_SITE_OTHER): Payer: Medicare Other | Admitting: Internal Medicine

## 2021-07-29 ENCOUNTER — Encounter: Payer: Self-pay | Admitting: Internal Medicine

## 2021-07-29 ENCOUNTER — Other Ambulatory Visit: Payer: Self-pay

## 2021-07-29 VITALS — BP 132/82 | HR 84 | Resp 18 | Ht 63.78 in | Wt 164.8 lb

## 2021-07-29 DIAGNOSIS — E2839 Other primary ovarian failure: Secondary | ICD-10-CM

## 2021-07-29 DIAGNOSIS — I10 Essential (primary) hypertension: Secondary | ICD-10-CM | POA: Diagnosis not present

## 2021-07-29 DIAGNOSIS — G4452 New daily persistent headache (NDPH): Secondary | ICD-10-CM

## 2021-07-29 NOTE — Assessment & Plan Note (Signed)
With new severe high calcium level which could be contributing to symptoms. She had labs checking this and parathyroid function at nephrology this week still pending to her knowledge. She has had scan localizing to 1 parathyroid gland about 1 year ago. Refer to general surgery for consideration for surgical removal. Ordered bone density to check for bone loss as this will affect her potential need for surgery. Prior imaging 2019 and normal.  ?

## 2021-07-29 NOTE — Progress Notes (Signed)
? ?  Subjective:  ? ?Patient ID: [name], female    DOB: 1950/08/08, 71 y.o.   MRN: [id number] ? ?HPI ?The patient is a 71 YO female coming in for concerns. ? ?Review of Systems  ?Constitutional:  Positive for activity change and appetite change.  ?HENT: Negative.    ?Eyes:  Positive for photophobia.  ?Respiratory:  Positive for shortness of breath. Negative for cough and chest tightness.   ?Cardiovascular:  Negative for chest pain, palpitations and leg swelling.  ?Gastrointestinal:  Negative for abdominal distention, abdominal pain, constipation, diarrhea, nausea and vomiting.  ?Musculoskeletal: Negative.   ?Skin: Negative.   ?Neurological:  Positive for headaches.  ?Psychiatric/Behavioral: Negative.    ? ?Objective:  ?Physical Exam ?Constitutional:   ?   Appearance: She is well-developed.  ?HENT:  ?   Head: Normocephalic and atraumatic.  ?   Right Ear: Tympanic membrane normal.  ?   Left Ear: Tympanic membrane normal.  ?Eyes:  ?   Extraocular Movements: Extraocular movements intact.  ?   Conjunctiva/sclera: Conjunctivae normal.  ?   Pupils: Pupils are equal, round, and reactive to light.  ?Cardiovascular:  ?   Rate and Rhythm: Normal rate and regular rhythm.  ?Pulmonary:  ?   Effort: Pulmonary effort is normal. No respiratory distress.  ?   Breath sounds: Normal breath sounds. No wheezing or rales.  ?Abdominal:  ?   General: Bowel sounds are normal. There is no distension.  ?   Palpations: Abdomen is soft.  ?   Tenderness: There is no abdominal tenderness. There is no rebound.  ?Musculoskeletal:  ?   Cervical back: Normal range of motion.  ?Skin: ?   General: Skin is warm and dry.  ?Neurological:  ?   Mental Status: She is alert and oriented to person, place, and time.  ?   Cranial Nerves: No cranial nerve deficit.  ?   Sensory: No sensory deficit.  ?   Coordination: Coordination normal.  ? ? ?Vitals:  ? 07/29/21 0916 07/29/21 0931  ?BP: 140/80 132/82  ?Pulse: 84   ?Resp: 18   ?SpO2: 96%   ?Weight: 164  lb 12.8 oz (74.8 kg)   ?Height: 5' 3.78" (1.62 m)   ? ? ?EKG: Rate 76, axis normal, interval normal, sinus, no st or t wave changes, no significant change compared to prior 2022 ? ?This visit occurred during the SARS-CoV-2 public health emergency.  Safety protocols were in place, including screening questions prior to the visit, additional usage of staff PPE, and extensive cleaning of exam room while observing appropriate contact time as indicated for disinfecting solutions.  ? ?Assessment & Plan:  ? ?

## 2021-07-29 NOTE — Assessment & Plan Note (Addendum)
She was concerned about BP being high due to severe headaches last few days. BP is normal today after she took an additional lisinopril once. She is advised to continue lisinopril 20 mg daily and can take an additional 20 mg lisinopril if needed for SBP >150 or DBP>95. She will continue her lasix 1-2 pills daily without change. She is not having improvement in headache even with normal BP today. EKG done without changes today.  ?

## 2021-07-29 NOTE — Patient Instructions (Signed)
We will get the CT scan of the head. It is okay to take an extra lisinopril for high BP if needed.  ? ? ?

## 2021-07-29 NOTE — Assessment & Plan Note (Signed)
Ordered CT scan head given new persistent headache which is severe. No recent trauma. She does have history of cancer and need to rule out mets. Pupils are equal and reactive today without neurological deficits. BP is normal today making hypertension as sole cause unlikely. She already has CT abdomen/pelvis/chest scheduled for 08/04/21 so will see if they can do CT head on the same apt time.  ?

## 2021-07-30 ENCOUNTER — Other Ambulatory Visit: Payer: Self-pay | Admitting: *Deleted

## 2021-07-30 ENCOUNTER — Telehealth: Payer: Self-pay | Admitting: *Deleted

## 2021-07-30 ENCOUNTER — Other Ambulatory Visit (HOSPITAL_BASED_OUTPATIENT_CLINIC_OR_DEPARTMENT_OTHER): Payer: Medicare Other

## 2021-07-30 ENCOUNTER — Other Ambulatory Visit (HOSPITAL_COMMUNITY): Payer: Medicare Other

## 2021-07-30 NOTE — Telephone Encounter (Signed)
Spoke with pt this afternoon and was able to schedule a lab appointment for tomorrow 07-31-21 @1 :15pm. Pt verbalized agreement and did not have any additional concerns.  ?

## 2021-07-30 NOTE — Telephone Encounter (Signed)
Attempted to speak with pt about scheduling a lab appointment for tomorrow. Unable to speak with pt. LVM for a call back.  ?

## 2021-07-30 NOTE — Telephone Encounter (Signed)
Okay.  Referral was placed.  Thanks ?

## 2021-07-31 ENCOUNTER — Other Ambulatory Visit: Payer: Self-pay

## 2021-07-31 ENCOUNTER — Inpatient Hospital Stay: Payer: Medicare Other

## 2021-07-31 DIAGNOSIS — Z85118 Personal history of other malignant neoplasm of bronchus and lung: Secondary | ICD-10-CM | POA: Diagnosis not present

## 2021-07-31 DIAGNOSIS — C55 Malignant neoplasm of uterus, part unspecified: Secondary | ICD-10-CM

## 2021-07-31 DIAGNOSIS — Z902 Acquired absence of lung [part of]: Secondary | ICD-10-CM | POA: Diagnosis not present

## 2021-07-31 DIAGNOSIS — Z823 Family history of stroke: Secondary | ICD-10-CM | POA: Diagnosis not present

## 2021-07-31 DIAGNOSIS — Z9221 Personal history of antineoplastic chemotherapy: Secondary | ICD-10-CM | POA: Diagnosis not present

## 2021-07-31 DIAGNOSIS — Z8542 Personal history of malignant neoplasm of other parts of uterus: Secondary | ICD-10-CM | POA: Diagnosis not present

## 2021-07-31 LAB — BASIC METABOLIC PANEL - CANCER CENTER ONLY
Anion gap: 10 (ref 5–15)
BUN: 26 mg/dL — ABNORMAL HIGH (ref 8–23)
CO2: 26 mmol/L (ref 22–32)
Calcium: 10.6 mg/dL — ABNORMAL HIGH (ref 8.9–10.3)
Chloride: 105 mmol/L (ref 98–111)
Creatinine: 1.53 mg/dL — ABNORMAL HIGH (ref 0.44–1.00)
GFR, Estimated: 36 mL/min — ABNORMAL LOW (ref >60)
Glucose, Bld: 94 mg/dL (ref 70–99)
Potassium: 4 mmol/L (ref 3.5–5.1)
Sodium: 141 mmol/L (ref 135–145)

## 2021-08-01 ENCOUNTER — Other Ambulatory Visit: Payer: Self-pay | Admitting: Internal Medicine

## 2021-08-03 ENCOUNTER — Ambulatory Visit: Payer: Medicare Other | Admitting: Internal Medicine

## 2021-08-03 ENCOUNTER — Other Ambulatory Visit (HOSPITAL_BASED_OUTPATIENT_CLINIC_OR_DEPARTMENT_OTHER): Payer: Medicare Other

## 2021-08-04 ENCOUNTER — Ambulatory Visit (HOSPITAL_COMMUNITY)
Admission: RE | Admit: 2021-08-04 | Discharge: 2021-08-04 | Disposition: A | Discharge status: Home / Self Care | Payer: Medicare Other | Source: Ambulatory Visit | Attending: Gynecologic Oncology | Admitting: Gynecologic Oncology

## 2021-08-04 ENCOUNTER — Encounter (HOSPITAL_COMMUNITY): Payer: Self-pay

## 2021-08-04 ENCOUNTER — Other Ambulatory Visit: Payer: Self-pay

## 2021-08-04 DIAGNOSIS — J984 Other disorders of lung: Secondary | ICD-10-CM | POA: Diagnosis not present

## 2021-08-04 DIAGNOSIS — G4452 New daily persistent headache (NDPH): Secondary | ICD-10-CM | POA: Insufficient documentation

## 2021-08-04 DIAGNOSIS — C55 Malignant neoplasm of uterus, part unspecified: Secondary | ICD-10-CM | POA: Insufficient documentation

## 2021-08-04 DIAGNOSIS — I517 Cardiomegaly: Secondary | ICD-10-CM | POA: Diagnosis not present

## 2021-08-04 DIAGNOSIS — D7389 Other diseases of spleen: Secondary | ICD-10-CM | POA: Diagnosis not present

## 2021-08-04 DIAGNOSIS — K449 Diaphragmatic hernia without obstruction or gangrene: Secondary | ICD-10-CM | POA: Diagnosis not present

## 2021-08-04 DIAGNOSIS — I700 Atherosclerosis of aorta: Secondary | ICD-10-CM | POA: Diagnosis not present

## 2021-08-04 DIAGNOSIS — N200 Calculus of kidney: Secondary | ICD-10-CM | POA: Diagnosis not present

## 2021-08-04 DIAGNOSIS — R519 Headache, unspecified: Secondary | ICD-10-CM | POA: Diagnosis not present

## 2021-08-04 MED ORDER — SODIUM CHLORIDE (PF) 0.9 % IJ SOLN
INTRAMUSCULAR | Status: AC
  Filled 2021-08-04: qty 50

## 2021-08-04 MED ORDER — IOHEXOL 9 MG/ML PO SOLN
ORAL | Status: AC
Start: 2021-08-04 — End: 2021-08-04
  Administered 2021-08-04: 1000 mL via ORAL
  Filled 2021-08-04: qty 1000

## 2021-08-04 MED ORDER — IOHEXOL 9 MG/ML PO SOLN: 500.0000 mL | ORAL | Status: AC

## 2021-08-04 MED ORDER — IOHEXOL 300 MG/ML  SOLN
80.0000 mL | Freq: Once | INTRAMUSCULAR | Status: AC | PRN
  Administered 2021-08-04: 80 mL via INTRAVENOUS

## 2021-08-05 ENCOUNTER — Ambulatory Visit (INDEPENDENT_AMBULATORY_CARE_PROVIDER_SITE_OTHER): Payer: Medicare Other

## 2021-08-05 ENCOUNTER — Ambulatory Visit (INDEPENDENT_AMBULATORY_CARE_PROVIDER_SITE_OTHER): Payer: Medicare Other | Admitting: Psychology

## 2021-08-05 DIAGNOSIS — N1832 Chronic kidney disease, stage 3b: Secondary | ICD-10-CM

## 2021-08-05 DIAGNOSIS — F4323 Adjustment disorder with mixed anxiety and depressed mood: Secondary | ICD-10-CM

## 2021-08-05 DIAGNOSIS — I10 Essential (primary) hypertension: Secondary | ICD-10-CM

## 2021-08-05 DIAGNOSIS — R197 Diarrhea, unspecified: Secondary | ICD-10-CM

## 2021-08-05 DIAGNOSIS — I700 Atherosclerosis of aorta: Secondary | ICD-10-CM

## 2021-08-05 DIAGNOSIS — D509 Iron deficiency anemia, unspecified: Secondary | ICD-10-CM

## 2021-08-05 NOTE — Patient Instructions (Signed)
Visit Information ? ?Following are the goals we discussed today:  ? ?Manage My Medicine  ? ?Timeframe:  Long-Range Goal ?Priority:  Medium ?Start Date:      08/05/2021                       ?Expected End Date: 08/06/2022                     ? ?Follow Up Date 11/05/2021 ?  ?- call for medicine refill 2 or 3 days before it runs out ?- call if I am sick and can't take my medicine ?- keep a list of all the medicines I take; vitamins and herbals too ?- learn to read medicine labels  ?  ?Why is this important?   ?These steps will help you keep on track with your medicines. ? ?Plan: Telephone follow up appointment with care management team member scheduled for:  3 months ?The patient has been provided with contact information for the care management team and has been advised to call with any health related questions or concerns.  ? ?Tomasa Blase, PharmD ?Clinical Pharmacist, [place]  ? ?Please call the care guide team at [phone] if you need to cancel or reschedule your appointment.  ? ?Patient verbalizes understanding of instructions and care plan provided today and agrees to view in [place]. Active MyChart status confirmed with patient.   ? ?

## 2021-08-05 NOTE — Progress Notes (Signed)
? ?Chronic Care Management ?Pharmacy Note ? ?08/05/2021 ?Name:  [name] MRN:  [id number] DOB:  1950/10/10 ? 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 ?Recommendations/Changes made from today's visit: ?-Recommending no changes to medications at this time  ?-Patient to continue to monitor blood pressure and reach out should BP control be lost prior to next appointment  ?-F/u in 3 months  ? ?Subjective: ?[name] is an 71 y.o. year old female who is a primary patient of Plotnikov, [name], MD.  The CCM team was consulted for assistance with disease management and care coordination needs.   ? ?Engaged with patient by telephone for return visit in response to provider referral for pharmacy case management and/or care coordination services.  ? ?Consent to Services:  ?The patient was given the following information about Chronic Care Management services today, agreed to services, and gave verbal consent: 1. CCM service includes personalized support from designated clinical staff supervised by the primary care provider, including individualized plan of care and coordination with other care providers 2. 24/7 contact phone numbers for assistance for urgent and routine care needs. 3. Service will only be  billed when office clinical staff spend 20 minutes or more in a month to coordinate care. 4. Only one practitioner may furnish and bill the service in a calendar month. 5.The patient may stop CCM services at any time (effective at the end of the month) by phone call to the office staff. 6. The patient will be responsible for cost sharing (co-pay) of up to 20% of the service fee (after annual deductible is met). Patient agreed to services and consent obtained. ? ?Patient Care Team: ?Plotnikov, [name], MD as PCP - General (Internal Medicine) ?Oren Binet, PhD as Consulting Physician (Psychology) ?Norma Fredrickson, MD as Consulting Physician (Psychiatry) ?Calvert Cantor, MD as Consulting Physician (Ophthalmology) ?Kozlow, Donnamarie Poag, MD as Consulting Physician (Allergy and Immunology) ?Alexis Frock, MD as Consulting Physician (Urology) ?Lafonda Mosses, MD as Consulting Physician (Gynecologic Oncology) ?Gery Pray, MD as Consulting Physician (Radiation Oncology) ?Mauri Pole, MD as Consulting Physician (Gastroenterology) ?Rosita Fire, MD as Consulting Physician (Nephrology) ?Melrose Nakayama, MD as Consulting Physician (Cardiothoracic Surgery) ?Sherlynn Stalls, MD as Consulting Physician (Ophthalmology) ?Tomasa Blase, [facility] (Pharmacist) ?Tomasa Blase, [facility] as Pharmacist (Pharmacist) ? ?Recent office visits: ?07/29/2021 - Dr. Sharlet Salina - persistent severe HA - CT scan ordered - high calcium level - referred to general surgery for consideration of removal - dexa scan ordered  ?07/06/2021  -Dr. Alain Marion - started lomotil and methylphenidate - f/u in 4 weeks  ?05/05/2021 - Dr. Sharlet Salina - bladder pain - U/A and culture ordered - ciprofloxacin rx'd  ? ?Recent consult visits: ?07/24/2021 - Dr. Berline Lopes - Gynecological Oncology - no changes - f/u in 3 months  ?04/24/2021 -  Dr. Berline Lopes - Gynecologic Oncology - repeat imaging in March - no changes to medications - follow up in 3 months   ? ?Hospital visits: ?07/17/2020-07/19/2020 - admitted for resection of nodule of upper lobe of right long  ?06/16/2020 - video bronchoscopy  ? ?Objective: ? ?Lab Results  ?Component Value Date  ? CREATININE 1.53 (H) 07/31/2021  ? BUN 26 (H) 07/31/2021  ? GFR 34.05 (L) 06/16/2021  ? GFRNONAA 36 (L) 07/31/2021  ? GFRAA 50 (L) 11/26/2019  ? NA 141 07/31/2021  ? K 4.0 07/31/2021  ? CALCIUM 10.6 (H) 07/31/2021  ? CO2 26 07/31/2021  ? GLUCOSE 94 07/31/2021  ? ? ?Lab Results  ?Component Value Date/Time  ? HGBA1C 5.5 06/27/2017 01:15 PM  ? HGBA1C 5.5 09/23/2008 04:50 PM  ? GFR 34.05 (L) 06/16/2021 12:59 PM  ? GFR 20.57 (L) 05/07/2021 01:30 PM  ?  ?Last diabetic Eye exam:  ?No results found for: HMDIABEYEEXA  ?Last diabetic Foot exam:  ?No results found for: HMDIABFOOTEX  ? ?Lab Results  ?Component Value Date  ? CHOL 263 (H) 10/18/2018  ? HDL 45.40 10/18/2018  ? LDLCALC UNABLE TO CALCULATE IF TRIGLYCERIDE OVER 400 mg/dL 08/26/2017  ? LDLDIRECT 160.0 10/18/2018  ? TRIG 271.0 (H) 10/18/2018  ? CHOLHDL 6 10/18/2018  ? ? ? ?  Latest Ref Rng & Units 06/16/2021  ? 12:59 PM 05/07/2021  ?  1:30 PM 12/11/2020  ?  2:54 PM  ?Hepatic Function  ?Total Protein 6.0 - 8.3 g/dL  7.4   7.2    ?Albumin 3.5 - 5.2 g/dL 4.2   4.3   4.1    ?AST 0 - 37 U/L  27   24    ?ALT 0 - 35 U/L  22   24    ?Alk Phosphatase 39 - 117 U/L  118   120    ?Total Bilirubin 0.2 - 1.2 mg/dL  0.5   0.3    ? ? ?Lab Results  ?Component Value Date/Time  ? TSH 0.88 05/07/2021 01:30 PM  ? TSH 1.26 04/14/2020 02:46 PM  ? FREET4 1.08 10/19/2018 04:28 PM  ? FREET4 1.05 10/15/2016 11:59 AM  ? ? ? ?  Latest Ref Rng & Units 05/07/2021  ?  1:30 PM 12/11/2020  ?  2:54 PM 10/09/2020  ?  3:43 PM  ?CBC  ?WBC 4.0 - 10.5 K/uL 7.3   11.8   6.7    ?Hemoglobin 12.0 - 15.0 g/dL 12.4   12.4   11.5    ?Hematocrit 36.0 - 46.0 % 37.1   38.0   33.3    ?Platelets 150.0 - 400.0 K/uL 421.0   432.0   400.0    ? ? ?Lab Results  ?Component Value Date/Time  ? VD25OH 69.27 05/07/2021 01:30 PM  ? VD25OH 53.01  06/27/2017 01:15 PM  ? ? ?Clinical ASCVD: No  ?The 10-year ASCVD risk score (Arnett DK, et al., 2019) is: 16.3% ?  Values used to calculate the score: ?    Age: 35 years ?    Sex: Female ?    Is Non-Hispanic African American: No ?    Diabetic: No ?    Tobacco smoker: No ?    Systolic Blood Pressure: 924 mmHg ?    Is BP treated: Yes ?    HDL Cholesterol: 45.4 mg/dL ?    Total Cholesterol: 263 mg/dL   ? ? ?  07/06/2021  ?  3:58 PM 12/09/2020  ? 11:35 AM 12/06/2019  ?  11:19 AM  ?Depression screen PHQ 2/9  ?Decreased Interest 3 1 0  ?Down, Depressed, Hopeless 3 0 0  ?PHQ - 2 Score 6 1 0  ?Altered sleeping 1 0 0  ?Tired, decreased energy 3 3 0  ?Change in appetite 2 0 0  ?Feeling bad or failure about yourself  1 1 0  ?Trouble concentrating 3 0 0  ?Moving slowly or fidgety/restless 3 0 0  ?Suicidal thoughts 0 0 0  ?PHQ-9 Score 19 5 0  ?Difficult doing work/chores  Not difficult at all Not difficult at all  ?  ?Social History  ? ?Tobacco Use  ?Smoking Status Never  ?Smokeless Tobacco Never  ? ?BP Readings from Last 3 Encounters:  ?07/29/21 132/82  ?07/24/21 125/73  ?07/06/21 118/80  ? ?Pulse Readings from Last 3 Encounters:  ?07/29/21 84  ?07/24/21 93  ?07/06/21 80  ? ?Wt Readings from Last 3 Encounters:  ?07/29/21 164 lb 12.8 oz (74.8 kg)  ?07/24/21 161 lb (73 kg)  ?07/06/21 163 lb 8 oz (74.2 kg)  ? ?BMI Readings from Last 3 Encounters:  ?07/29/21 28.48 kg/m?  ?07/24/21 27.83 kg/m?  ?07/06/21 29.90 kg/m?  ? ? ?Assessment/Interventions: Review of patient past medical history, allergies, medications, health status, including review of consultants reports, laboratory and other test data, was performed as part of comprehensive evaluation and provision of chronic care management services.  ? ?SDOH:  (Social Determinants of Health) assessments and interventions performed: Yes ? ? ?SDOH Screenings  ? ?Alcohol Screen: Not on file  ?Depression (PHQ2-9): Medium Risk  ? PHQ-2 Score: 19  ?Financial Resource Strain: Not on file  ?Food  Insecurity: Not on file  ?Housing: Not on file  ?Physical Activity: Not on file  ?Social Connections: Not on file  ?Stress: Not on file  ?Tobacco Use: Low Risk   ? Smoking Tobacco Use: Never  ? Smokeless Tobacco Use: N

## 2021-08-05 NOTE — Progress Notes (Signed)
? ? ? ? ? ? ? ? ? ? ? ? ? ? ?[name] is a 71 y.o. female patient  ?Date: 08/05/2021  ?Treatment Plan ?Diagnosis ?F43.21 (Adjustment Disorder, With depressed mood) [n/a]  ?Symptoms ?A diagnosis of an acute, serious illness that is life-threatening. (Status: maintained) -- No Description Entered  ?Diminished interest in or enjoyment of activities. (Status: maintained) -- No Description Entered  ?Lack of energy. (Status: maintained) -- No Description Entered  ?Sad affect, social withdrawal, anxiety, loss of interest in activities, and low energy. (Status: maintained) -- No Description Entered  ?Medication Status ?compliance  ?Safety ?unspecified ?If Suicidal or Homicidal State Action Taken: unspecified  ?Current Risk:  ?Medications ?Buspar (Dosage: 30mg  2X/day)  ?New Medication (Dosage: 250mg  2x/day)  ?New Medication (Dosage: 250mg  2x/day)  ?Prozac (Dosage: 40mg  daily)  ?Remeron (Dosage: 30mg  nightly)  ?xanax (Dosage: .5mg  2-3X/day)  ?Objectives ?Related Problem: Recognize, accept, and cope with feelings of depression. ?Description: Identify and replace thoughts and beliefs that support depression. ?Target Date: 2022-04-28 ?Frequency: Daily ?Modality: individual ?Progress: 70%  ?Related Problem: Recognize, accept, and cope with feelings of depression. ?Description: Verbalize an understanding of healthy and unhealthy emotions with the intent of increasing the use of healthy emotions to guide actions. ?Target Date: 2022-04-28 ?Frequency: Daily ?Modality: individual ?Progress: 70%  ?Related Problem: Recognize, accept, and cope with feelings of depression. ?Description: Increasingly verbalize hopeful and positive statements regarding self, others, and the future. ?Target Date: 2022-04-28 ?Frequency: Daily ?Modality: individual ?Progress: 70% ? ?Related Problem: Reduce fear, anxiety, and worry associated with the medical condition. ?Description: Share with significant others efforts to adapt successfully to the  medical condition/diagnosis. ?Target Date: 2022-04-28 ?Frequency: Daily ?Modality: individual ?Progress: 80%  ?Related Problem: Reduce fear, anxiety, and worry associated with the medical condition. ?Description: Engage in social, productive, and recreational activities that are possible in spite of medical condition. ?Target Date: 2022-04-28 ?Frequency: Daily ?Modality: individual ?Progress: 40% ? ?Related Problem: Reduce fear, anxiety, and worry associated with the medical condition. ?Description: Identify the coping skills and sources of emotional support that have been beneficial in the past. ?Target Date: 2022-04-28 ?Frequency: Daily ?Modality: individual ?Progress: 50%  ?Client Response ?full compliance  ?Service Location ?Location, [name] [place], [place] [address]  ?Service Code ?cpt W4176370  ?Related past to present  ?Facilitate problem solving  ?Identify/label emotions  ?Validate/empathize  ?Self care activities  ?Lifestyle change (exercise, nutrition)  ?Assess/facilitate readiness to change  ?Relaxation training  ?Rationally challenge thoughts or beliefs/cognitive restructuring  ?Comments  ?Dx: B76.28  ?Meds: Prozac, Buspar and Remeron.  ?Goals: Following the failure of a third marriage, [name], 2021 (completed). She is also wanting to utilize counseling to adjust and accept her latest diagnosis of cancer. She wants assistance to better manage this news and remain positive throughout treatment. Goal date is 12-22. Patient has made progress, but her medical condition is variable  and she requests additional treatment to manage moods throughout her medical ordeal. In addition, she is trying to manage feelings associated with her older son's mental health struggles. Revised goal date is 12-23.  ?Patient agrees to have  a video session due to the [facility] Virus. She is at home and I am in home office.  ? ?[name] says that she had her 6 month body scan yesterday. Does not yet have results except for head, which shows no tumor. She is not especially anxious and has a good perspective of her illness. She talked about her work with Dr. Inocente Salles and her recognition that she must set boundaries. She has difficulty saying no and disappointing those people she cares about. She will work on establishing and maintaining boundaries.               ? ? ?[name] Time: 3:15p-4:00 45 minutes   ? ? ? ? ? ?

## 2021-08-06 ENCOUNTER — Telehealth: Payer: Self-pay

## 2021-08-06 ENCOUNTER — Encounter: Payer: Self-pay | Admitting: Internal Medicine

## 2021-08-06 DIAGNOSIS — R413 Other amnesia: Secondary | ICD-10-CM

## 2021-08-06 DIAGNOSIS — R41844 Frontal lobe and executive function deficit: Secondary | ICD-10-CM

## 2021-08-06 NOTE — Telephone Encounter (Signed)
Received call from Ms. Masters-Mark inquiring about her CT chest, abdomen and pelvis results. Patient had a head CT at the same time and the results are back but she is unable to see the other scan results. Advised patient that the results for the CT chest abdomen pelvis have not resulted yet. Our office will contact radiology and follow up with them. Once we receive the results we will contact her. Patient verbalized understanding.  ?

## 2021-08-10 ENCOUNTER — Ambulatory Visit (INDEPENDENT_AMBULATORY_CARE_PROVIDER_SITE_OTHER): Payer: Medicare Other | Admitting: Psychology

## 2021-08-10 DIAGNOSIS — F4323 Adjustment disorder with mixed anxiety and depressed mood: Secondary | ICD-10-CM

## 2021-08-10 NOTE — Progress Notes (Signed)
? ? ? ? ? ?[name] is a 71 y.o. female patient  ?Date: 08/10/2021  ?Treatment Plan ?Diagnosis ?F43.21 (Adjustment Disorder, With depressed mood) [n/a]  ?Symptoms ?A diagnosis of an acute, serious illness that is life-threatening. (Status: maintained) -- No Description Entered  ?Diminished interest in or enjoyment of activities. (Status: maintained) -- No Description Entered  ?Lack of energy. (Status: maintained) -- No Description Entered  ?Sad affect, social withdrawal, anxiety, loss of interest in activities, and low energy. (Status: maintained) -- No Description Entered  ?Medication Status ?compliance  ?Safety ?unspecified ?If Suicidal or Homicidal State Action Taken: unspecified  ?Current Risk:  ?Medications ?Buspar (Dosage: 30mg  2X/day)  ?New Medication (Dosage: 250mg  2x/day)  ?New Medication (Dosage: 250mg  2x/day)  ?Prozac (Dosage: 40mg  daily)  ?Remeron (Dosage: 30mg  nightly)  ?xanax (Dosage: .5mg  2-3X/day)  ?Objectives ?Related Problem: Recognize, accept, and cope with feelings of depression. ?Description: Identify and replace thoughts and beliefs that support depression. ?Target Date: 2022-04-28 ?Frequency: Daily ?Modality: individual ?Progress: 70%  ?Related Problem: Recognize, accept, and cope with feelings of depression. ?Description: Verbalize an understanding of healthy and unhealthy emotions with the intent of increasing the use of healthy emotions to guide actions. ?Target Date: 2022-04-28 ?Frequency: Daily ?Modality: individual ?Progress: 70%  ?Related Problem: Recognize, accept, and cope with feelings of depression. ?Description: Increasingly verbalize hopeful and positive statements regarding self, others, and the future. ?Target Date: 2022-04-28 ?Frequency: Daily ?Modality: individual ?Progress: 70% ? ?Related Problem: Reduce fear, anxiety, and worry associated with the medical condition. ?Description: Share with significant others efforts to adapt successfully to the medical  condition/diagnosis. ?Target Date: 2022-04-28 ?Frequency: Daily ?Modality: individual ?Progress: 80%  ?Related Problem: Reduce fear, anxiety, and worry associated with the medical condition. ?Description: Engage in social, productive, and recreational activities that are possible in spite of medical condition. ?Target Date: 2022-04-28 ?Frequency: Daily ?Modality: individual ?Progress: 40% ? ?Related Problem: Reduce fear, anxiety, and worry associated with the medical condition. ?Description: Identify the coping skills and sources of emotional support that have been beneficial in the past. ?Target Date: 2022-04-28 ?Frequency: Daily ?Modality: individual ?Progress: 50%  ?Client Response ?full compliance  ?Service Location ?Location, [name] [place], [place] [address]  ?Service Code ?cpt W4176370  ?Related past to present  ?Facilitate problem solving  ?Identify/label emotions  ?Validate/empathize  ?Self care activities  ?Lifestyle change (exercise, nutrition)  ?Assess/facilitate readiness to change  ?Relaxation training  ?Rationally challenge thoughts or beliefs/cognitive restructuring  ?Comments  ?Dx: Y60.63  ?Meds: Prozac, Buspar and Remeron.  ?Goals: Following the failure of a third marriage, [name], 2021 (completed). She is also wanting to utilize counseling to adjust and accept her latest diagnosis of cancer. She wants assistance to better manage this news and remain positive throughout treatment. Goal date is 12-22. Patient has made progress, but her medical condition is variable and she  requests additional treatment to manage moods throughout her medical ordeal. In addition, she is trying to manage feelings associated with her older son's mental health struggles. Revised goal date is 12-23.  ?Patient agrees to have a video session due to the [facility] Virus. She  is at home and I am in home office.  ? ?[name] attended her mother's 94th birthday party. This is first time [name] has really enjoyed herself in a very long tome. States she felt "normal". She has been feeling better "for a whole week" and is feeling more optimistic She feels that she has a much better perspective on Dr. Inocente Grant and his return home, [name] found a spiritual person (a [name]) that has explained many after death concepts. She finds this to be very affirming. Her body scan did not show any new spots and this contributes to her current optimism.                  ? ? ?[name] Time: 3:15p-4:00 45 minutes   ? ? ? ? ? ?

## 2021-08-13 ENCOUNTER — Telehealth: Payer: Self-pay

## 2021-08-13 NOTE — Progress Notes (Addendum)
? ? ?  Chronic Care Management ?Pharmacy Assistant  ? ?Name: [name]  MRN: [id number] DOB: [birth year] ? ?Spoke with patient this morning who states that she talked with Dr. Sharlet Salina and that Dr. Sharlet Salina stated that she can increase taking her lisinopril up to three times a day. She stated that she is having bladder pain and burning and her blood pressure is elevated, so she has been taking more of the lisinopril.  ? ?Patient called back to give some blood pressure reading for today: ?164/103 this morning took 20 mg lisinopril ?125/84 at 10:30, did not take lisinopril ?157/94 at 12:30 took 20 mg lisinopril  ? ?Patient states that she is feeling well right now and is not having any bladder pain since Dr. Camila Li prescribe cipro. ? ?Ethelene Hal ?Clinical Pharmacist Assistant ?[phone]  ?

## 2021-08-13 NOTE — Telephone Encounter (Signed)
Spoke with patient  ? ?Reports that she has been having issues with continued erratic blood pressure associated with HA ? ?Notes that at this time she is feeling better BP  - BP returning to normal range after 2nd dose of lisinopril most days  - most recently was ~120/80 ? ?Reports issues with bladder pain yesterday, but has started cipro from Dr. Alain Marion along with azo - reports that bladder pain has subsided since starting  ? ?Recommended for patient to continue current medications, continue to monitor BP and reach out with any issues  ? ?Tomasa Blase, PharmD ?Clinical Pharmacist, [place] ?

## 2021-08-14 DIAGNOSIS — D509 Iron deficiency anemia, unspecified: Secondary | ICD-10-CM | POA: Diagnosis not present

## 2021-08-14 DIAGNOSIS — I10 Essential (primary) hypertension: Secondary | ICD-10-CM | POA: Diagnosis not present

## 2021-08-19 ENCOUNTER — Ambulatory Visit (INDEPENDENT_AMBULATORY_CARE_PROVIDER_SITE_OTHER): Payer: Medicare Other | Admitting: Psychology

## 2021-08-19 ENCOUNTER — Encounter: Payer: Self-pay | Admitting: Psychology

## 2021-08-19 DIAGNOSIS — F4323 Adjustment disorder with mixed anxiety and depressed mood: Secondary | ICD-10-CM | POA: Diagnosis not present

## 2021-08-19 NOTE — Progress Notes (Signed)
? ? ? ? ? ? ? ? ? ? ? ? ? ? ? ? ? ? ? ? ?[name] is a 71 y.o. female patient  ?Date: 08/19/2021  ?Treatment Plan ?Diagnosis ?F43.21 (Adjustment Disorder, With depressed mood) [n/a]  ?Symptoms ?A diagnosis of an acute, serious illness that is life-threatening. (Status: maintained) -- No Description Entered  ?Diminished interest in or enjoyment of activities. (Status: maintained) -- No Description Entered  ?Lack of energy. (Status: maintained) -- No Description Entered  ?Sad affect, social withdrawal, anxiety, loss of interest in activities, and low energy. (Status: maintained) -- No Description Entered  ?Medication Status ?compliance  ?Safety ?unspecified ?If Suicidal or Homicidal State Action Taken: unspecified  ?Current Risk:  ?Medications ?Buspar (Dosage: 30mg  2X/day)  ?New Medication (Dosage: 250mg  2x/day)  ?New Medication (Dosage: 250mg  2x/day)  ?Prozac (Dosage: 40mg  daily)  ?Remeron (Dosage: 30mg  nightly)  ?xanax (Dosage: .5mg  2-3X/day)  ?Objectives ?Related Problem: Recognize, accept, and cope with feelings of depression. ?Description: Identify and replace thoughts and beliefs that support depression. ?Target Date: 2022-04-28 ?Frequency: Daily ?Modality: individual ?Progress: 70%  ?Related Problem: Recognize, accept, and cope with feelings of depression. ?Description: Verbalize an understanding of healthy and unhealthy emotions with the intent of increasing the use of healthy emotions to guide actions. ?Target Date: 2022-04-28 ?Frequency: Daily ?Modality: individual ?Progress: 70%  ?Related Problem: Recognize, accept, and cope with feelings of depression. ?Description: Increasingly verbalize hopeful and positive statements regarding self, others, and the future. ?Target Date: 2022-04-28 ?Frequency: Daily ?Modality: individual ?Progress: 70% ? ?Related Problem: Reduce fear, anxiety, and worry associated with the medical condition. ?Description: Share with significant others efforts to adapt  successfully to the medical condition/diagnosis. ?Target Date: 2022-04-28 ?Frequency: Daily ?Modality: individual ?Progress: 80%  ?Related Problem: Reduce fear, anxiety, and worry associated with the medical condition. ?Description: Engage in social, productive, and recreational activities that are possible in spite of medical condition. ?Target Date: 2022-04-28 ?Frequency: Daily ?Modality: individual ?Progress: 40% ? ?Related Problem: Reduce fear, anxiety, and worry associated with the medical condition. ?Description: Identify the coping skills and sources of emotional support that have been beneficial in the past. ?Target Date: 2022-04-28 ?Frequency: Daily ?Modality: individual ?Progress: 50%  ?Client Response ?full compliance  ?Service Location ?Location, [name] [place], [place] [address]  ?Service Code ?cpt W4176370  ?Related past to present  ?Facilitate problem solving  ?Identify/label emotions  ?Validate/empathize  ?Self care activities  ?Lifestyle change (exercise, nutrition)  ?Assess/facilitate readiness to change  ?Relaxation training  ?Rationally challenge thoughts or beliefs/cognitive restructuring  ?Comments  ?Dx: U04.54  ?Meds: Prozac, Buspar and Remeron.  ?Goals: Following the failure of a third marriage, [name], 2021 (completed). She is also wanting to utilize counseling to adjust and accept her latest diagnosis of cancer. She wants assistance to better manage this news and remain positive throughout treatment. Goal date is 12-22. Patient has made progress, but her medical  condition is variable and she requests additional treatment to manage moods throughout her medical ordeal. In addition, she is trying to manage feelings associated with her older son's mental health struggles. Revised goal date is  12-23.  ?Patient agrees to have a video session due to the [facility] Virus. She is at home and I am in home office.  ? ?[name] says that she has been working with the pharmacist at [facility] to check all her meds. This has been helpful and help provides some relief.  ?States that the ritalin medication (once daily) is really helping her focus and get things done. ?Has a bone density scan tomorrow due to her abnormal parathyroid.  ?She continues to feel better and hs more energy. Will spend the holiday with her parent and will do some things at the church.                   ? ? ?[name] Time: 3:15p-4:00 45 minutes   ? ? ? ? ? ?

## 2021-08-20 ENCOUNTER — Ambulatory Visit (HOSPITAL_BASED_OUTPATIENT_CLINIC_OR_DEPARTMENT_OTHER): Payer: Medicare Other

## 2021-08-24 ENCOUNTER — Ambulatory Visit (INDEPENDENT_AMBULATORY_CARE_PROVIDER_SITE_OTHER): Payer: Medicare Other | Admitting: Psychology

## 2021-08-24 DIAGNOSIS — F4323 Adjustment disorder with mixed anxiety and depressed mood: Secondary | ICD-10-CM | POA: Diagnosis not present

## 2021-08-24 NOTE — Progress Notes (Signed)
? ? ? ? ? ? ? ? ? ? ? ? ? ? ? ? ? ? ? ? ? ? ? ? ? ? ? ? ? ? ? ? ? ? ? ?[name] is a 71 y.o. female patient  ?Date: 08/24/2021  ?Treatment Plan ?Diagnosis ?F43.21 (Adjustment Disorder, With depressed mood) [n/a]  ?Symptoms ?A diagnosis of an acute, serious illness that is life-threatening. (Status: maintained) -- No Description Entered  ?Diminished interest in or enjoyment of activities. (Status: maintained) -- No Description Entered  ?Grant of energy. (Status: maintained) -- No Description Entered  ?Sad affect, social withdrawal, anxiety, loss of interest in activities, and low energy. (Status: maintained) -- No Description Entered  ?Medication Status ?compliance  ?Safety ?unspecified ?If Suicidal or Homicidal State Action Taken: unspecified  ?Current Risk:  ?Medications ?Buspar (Dosage: 30mg  2X/day)  ?New Medication (Dosage: 250mg  2x/day)  ?New Medication (Dosage: 250mg  2x/day)  ?Prozac (Dosage: 40mg  daily)  ?Remeron (Dosage: 30mg  nightly)  ?xanax (Dosage: .5mg  2-3X/day)  ?Objectives ?Related Problem: Recognize, accept, and cope with feelings of depression. ?Description: Identify and replace thoughts and beliefs that support depression. ?Target Date: 2022-04-28 ?Frequency: Daily ?Modality: individual ?Progress: 70%  ?Related Problem: Recognize, accept, and cope with feelings of depression. ?Description: Verbalize an understanding of healthy and unhealthy emotions with the intent of increasing the use of healthy emotions to guide actions. ?Target Date: 2022-04-28 ?Frequency: Daily ?Modality: individual ?Progress: 70%  ?Related Problem: Recognize, accept, and cope with feelings of depression. ?Description: Increasingly verbalize hopeful and positive statements regarding self, others, and the future. ?Target Date: 2022-04-28 ?Frequency: Daily ?Modality: individual ?Progress: 70% ? ?Related Problem: Reduce fear, anxiety, and worry associated with the medical condition. ?Description: Share with significant  others efforts to adapt successfully to the medical condition/diagnosis. ?Target Date: 2022-04-28 ?Frequency: Daily ?Modality: individual ?Progress: 80%  ?Related Problem: Reduce fear, anxiety, and worry associated with the medical condition. ?Description: Engage in social, productive, and recreational activities that are possible in spite of medical condition. ?Target Date: 2022-04-28 ?Frequency: Daily ?Modality: individual ?Progress: 40% ? ?Related Problem: Reduce fear, anxiety, and worry associated with the medical condition. ?Description: Identify the coping skills and sources of emotional support that have been beneficial in the past. ?Target Date: 2022-04-28 ?Frequency: Daily ?Modality: individual ?Progress: 50%  ?Client Response ?full compliance  ?Service Location ?Location, [name] [place], [place] [address]  ?Service Code ?cpt W4176370  ?Related past to present  ?Facilitate problem solving  ?Identify/label emotions  ?Validate/empathize  ?Self care activities  ?Lifestyle change (exercise, nutrition)  ?Assess/facilitate readiness to change  ?Relaxation training  ?Rationally challenge thoughts or beliefs/cognitive restructuring  ?Comments  ?Dx: W58.09  ?Meds: Prozac, Buspar and Remeron.  ?Goals: Following the failure of a third marriage, [name], 2021 (completed). She is also wanting to utilize counseling to adjust and accept her latest diagnosis of cancer. She wants assistance to better manage this news and remain positive throughout treatment. Goal date is 12-22. Patient has made  progress, but her medical condition is variable and she requests additional treatment to manage moods throughout her medical ordeal. In addition, she is trying  to manage feelings associated with her older son's mental health struggles. Revised goal date is 12-23.  ?Patient agrees to have a video session due to the [facility] Virus. She is at home and I am in home office.  ? ?[name] is continuing to feel better and is able to start doing more of her daily tasks. She talked about her friend who has inherited significant money and cannot grasp [name]'s financial situation. She says that she has learned to live on her limited income. She and son went out to dinner at a restaurant. It was first time she has eaten at a restaurant in 3 years. She is in remission and is very grateful to be cancer free at this time. 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 ?She is concerned about her son [name], who is not communicating very much. He is defensive and withholding from her. In contrast, her son [name] is doing well and about to graduate with his nursing degree. [name]'s neighbor's daughter (age 52), died of an overdose. This makes [name] nervous about [name]. We talked about how to be supportive of him without being intrusive.                      ? ? ?[name] Time: 4:15p-5:00 45 minutes   ? ? ? ? ? ?

## 2021-08-26 ENCOUNTER — Ambulatory Visit (HOSPITAL_BASED_OUTPATIENT_CLINIC_OR_DEPARTMENT_OTHER)
Admission: RE | Admit: 2021-08-26 | Discharge: 2021-08-26 | Disposition: A | Discharge status: Home / Self Care | Payer: Medicare Other | Source: Ambulatory Visit | Attending: Internal Medicine | Admitting: Internal Medicine

## 2021-08-26 DIAGNOSIS — M85852 Other specified disorders of bone density and structure, left thigh: Secondary | ICD-10-CM | POA: Diagnosis not present

## 2021-08-26 DIAGNOSIS — E2839 Other primary ovarian failure: Secondary | ICD-10-CM | POA: Diagnosis present

## 2021-08-26 DIAGNOSIS — Z780 Asymptomatic menopausal state: Secondary | ICD-10-CM | POA: Diagnosis not present

## 2021-08-28 DIAGNOSIS — I10 Essential (primary) hypertension: Secondary | ICD-10-CM | POA: Diagnosis not present

## 2021-08-28 DIAGNOSIS — E210 Primary hyperparathyroidism: Secondary | ICD-10-CM | POA: Diagnosis not present

## 2021-08-31 ENCOUNTER — Telehealth: Payer: Self-pay

## 2021-08-31 NOTE — Progress Notes (Signed)
? ? ?Chronic Care Management ?Pharmacy Assistant  ? ?Name: STEPHIE XU  MRN: [id number] DOB: April 29, 1951 ? ? ? ?Reason for Encounter: Disease State-General ?  ? ?Recent office visits:  ?None ID ? ?Recent consult visits:  ?None ID ? ?Hospital visits:  ?None since last coordination call ? ?Medications: ?Outpatient Encounter Medications as of 08/31/2021  ?Medication Sig  ? acetaminophen (TYLENOL) 325 MG tablet Take 2 tablets (650 mg total) by mouth every 6 (six) hours as needed for moderate pain. (Patient not taking: Reported on 08/05/2021)  ? ALPRAZolam (XANAX) 0.5 MG tablet Take 1 tablet (0.5 mg total) by mouth 2 (two) times daily as needed for anxiety. (Patient taking differently: Take 0.5 mg by mouth at bedtime as needed for anxiety.)  ? aspirin EC 81 MG tablet Take 1 tablet (81 mg total) by mouth daily.  ? busPIRone (BUSPAR) 30 MG tablet Take 1 tablet (30 mg total) by mouth 2 (two) times daily.  ? Cholecalciferol (D3 5000) 125 MCG (5000 UT) capsule Take 5,000 Units by mouth daily.  ? colesevelam (WELCHOL) 625 MG tablet TAKE 2 TABLETS (1,250 MG TOTAL) BY MOUTH 2 (TWO) TIMES DAILY WITH A MEAL.  ? D-Mannose 500 MG CAPS Take 500 mg by mouth 2 (two) times daily. WITH CRANBERRY & DANDELION EXTRACT  ? diphenoxylate-atropine (LOMOTIL) 2.5-0.025 MG tablet Take 1 tablet by mouth 4 (four) times daily as needed for diarrhea or loose stools.  ? esomeprazole (NEXIUM) 40 MG capsule Take 1 capsule (40 mg total) by mouth daily. Take 1 by mouth in the morning  ? FLUoxetine (PROZAC) 40 MG capsule Take 1 capsule (40 mg total) by mouth every morning.  ? furosemide (LASIX) 20 MG tablet Take 1-2 tablets (20-40 mg total) by mouth daily.  ? ipratropium-albuterol (DUONEB) 0.5-2.5 (3) MG/3ML SOLN Take 3 mLs by nebulization every 6 (six) hours as needed (Asthma).  ? iron polysaccharides (FERREX 150) 150 MG capsule Take 1 capsule (150 mg total) by mouth 2 (two) times daily.  ? levothyroxine (SYNTHROID) 100 MCG tablet Take 1 tablet (100  mcg total) by mouth daily before breakfast.  ? lisinopril (ZESTRIL) 20 MG tablet TAKE 1 (ONE) TABLET IN THE EVENING  ? loperamide (IMODIUM A-D) 2 MG tablet Take 2 mg by mouth 4 (four) times daily as needed for diarrhea or loose stools. (Patient not taking: Reported on 08/05/2021)  ? METAMUCIL FIBER PO Take 1 capsule by mouth in the morning and at bedtime.  ? methylphenidate (RITALIN) 5 MG tablet Take 1 tablet (5 mg total) by mouth 2 (two) times daily with breakfast and lunch. (Patient taking differently: Take 5 mg by mouth daily.)  ? mirtazapine (REMERON) 30 MG tablet Take 1 tablet (30 mg total) by mouth at bedtime.  ? Omega-3 Fatty Acids (FISH OIL) 1000 MG CPDR Take 1,000 mg by mouth daily.  ? OVER THE COUNTER MEDICATION 7 MUSHROOM BLEND take 1 capsule every day  ? Polyethyl Glycol-Propyl Glycol (SYSTANE OP) Place 1 drop into both eyes 2 (two) times daily as needed (dry eyes).   ? progesterone (PROMETRIUM) 100 MG capsule Take 100 mg by mouth daily.  ? traMADol (ULTRAM) 50 MG tablet Take 50 mg by mouth every 6 (six) hours as needed.  ? vitamin B-12 (CYANOCOBALAMIN) 1000 MCG tablet Take 1,000 mcg by mouth daily.  ? vitamin C (ASCORBIC ACID) 500 MG tablet Take 500 mg by mouth daily.  ? [DISCONTINUED] famotidine (PEPCID) 40 MG tablet TAKE 1 TABLET BY MOUTH EVERY DAY IN THE EVENING (  Patient not taking: No sig reported)  ? ?No facility-administered encounter medications on file as of 08/31/2021.  ? ?Have you had any problems recently with your health?Patient stated that her blood pressure is still erratic at times but she is taking the lisinopril in the morning and at night. Patient also stated that she still has some burning when she goes to the bathroom but went and got some otc test strips to test urine and results were negative. She states that she is having surgery to remove tyroid nodule. ? ?Have you had any problems with your pharmacy?Patient states that she is not having any problems with getting medications from  pharmacy or the cost of medications ? ?What issues or side effects are you having with your medications?Patient states that she is not having any side effects from medications ? ?What would you like me to pass along to [facility] for them to help you with? Patient states that she is doing better with blood pressure. Reminded patient is has f/u with Linna Hoff in June. ? ?What can we do to take care of you better?Patient states that she appreciates Chronic Care service calls each month ?  ?Care Gaps: ?Colonoscopy-05/12/16 ?Diabetic Foot Exam-NA ?Mammogram-06/15/10 ?Ophthalmology-NA ?Dexa Scan - NA ?Annual Well Visit -NA  ?Micro albumin-NA ?Hemoglobin A1c- NA ? ?Star Rating Drugs: ?Lisinopril 20 mg-last fill 07/19/21 90 ds ? ?Ethelene Hal ?Clinical Pharmacist Assistant ?[phone]  ?

## 2021-09-02 ENCOUNTER — Ambulatory Visit (INDEPENDENT_AMBULATORY_CARE_PROVIDER_SITE_OTHER): Payer: Medicare Other | Admitting: Psychology

## 2021-09-02 DIAGNOSIS — F4323 Adjustment disorder with mixed anxiety and depressed mood: Secondary | ICD-10-CM
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
## 2021-09-03 ENCOUNTER — Other Ambulatory Visit: Payer: Self-pay | Admitting: Internal Medicine

## 2021-09-03 ENCOUNTER — Ambulatory Visit (INDEPENDENT_AMBULATORY_CARE_PROVIDER_SITE_OTHER): Payer: Medicare Other | Admitting: Cardiology

## 2021-09-03 ENCOUNTER — Encounter: Payer: Self-pay | Admitting: Cardiology

## 2021-09-03 VITALS — BP 148/88 | HR 88 | Ht 63.0 in | Wt 164.0 lb

## 2021-09-03 DIAGNOSIS — I10 Essential (primary) hypertension: Secondary | ICD-10-CM | POA: Diagnosis not present

## 2021-09-03 DIAGNOSIS — N183 Chronic kidney disease, stage 3 unspecified: Secondary | ICD-10-CM

## 2021-09-03 NOTE — Patient Instructions (Signed)
Medication Instructions:  Your physician recommends that you continue on your current medications as directed. Please refer to the Current Medication list given to you today.  *If you need a refill on your cardiac medications before your next appointment, please call your pharmacy*   Lab Work: None If you have labs (blood work) drawn today and your tests are completely normal, you will receive your results only by: MyChart Message (if you have MyChart) OR A paper copy in the mail If you have any lab test that is abnormal or we need to change your treatment, we will call you to review the results.   Testing/Procedures: None   Follow-Up: At [facility], you and your health needs are our priority.  As part of our continuing mission to provide you with exceptional heart care, we have created designated Provider Care Teams.  These Care Teams include your primary Cardiologist (physician) and Advanced Practice Providers (APPs -  Physician Assistants and Nurse Practitioners) who all work together to provide you with the care you need, when you need it.  We recommend signing up for the patient portal called "MyChart".  Sign up information is provided on this After Visit Summary.  MyChart is used to connect with patients for Virtual Visits (Telemedicine).  Patients are able to view lab/test results, encounter notes, upcoming appointments, etc.  Non-urgent messages can be sent to your provider as well.   To learn more about what you can do with MyChart, go to [email or web address].    Your next appointment:   9 month(s)  The format for your next appointment:   In Person  Provider:   Brian Munley, MD    Other Instructions None  Important Information About Sugar       
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
## 2021-09-07 ENCOUNTER — Ambulatory Visit: Payer: Medicare Other | Admitting: Psychology

## 2021-09-09 ENCOUNTER — Ambulatory Visit: Payer: Self-pay | Admitting: General Surgery

## 2021-09-16 ENCOUNTER — Ambulatory Visit: Payer: Medicare Other | Admitting: Psychology

## 2021-09-21 ENCOUNTER — Other Ambulatory Visit: Payer: Self-pay | Admitting: Internal Medicine

## 2021-09-21 ENCOUNTER — Ambulatory Visit (INDEPENDENT_AMBULATORY_CARE_PROVIDER_SITE_OTHER): Payer: Medicare Other | Admitting: Psychology

## 2021-09-21 DIAGNOSIS — F4323 Adjustment disorder with mixed anxiety and depressed mood: Secondary | ICD-10-CM | POA: Diagnosis not present

## 2021-09-21 NOTE — Progress Notes (Signed)
? ? ? ? ? ? ? ? ? ? ? ? ? ? ? ? ? ? ? ? ? ? ? ? ? ? ? ? ? ? ? ? ? ? ? ? ? ? ? ? ? ? ? ? ? ? ? ? ? ? ? ? ? ? ? ? ? ? ? ? ? ? ? ? ? ?[name] is a 71 y.o. 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 (Status: maintained) -- No Description Entered  ?Diminished interest in or enjoyment of activities. (Status: maintained) -- No Description Entered  ?Grant of energy. (Status: maintained) -- No Description Entered  ?Sad affect, social withdrawal, anxiety, loss of interest in activities, and low energy. (Status: maintained) -- No Description Entered  ?Medication Status ?compliance  ?Safety ?unspecified ?If Suicidal or Homicidal State Action Taken: unspecified  ?Current Risk:  ?Medications ?Buspar (Dosage: 30mg  2X/day)  ?New Medication (Dosage: 250mg  2x/day)  ?New Medication (Dosage: 250mg  2x/day)  ?Prozac (Dosage: 40mg  daily)  ?Remeron (Dosage: 30mg  nightly)  ?xanax (Dosage: .5mg  2-3X/day)  ?Objectives ?Related Problem: Recognize, accept, and cope with feelings of depression. ?Description: Identify and replace thoughts and beliefs that support depression. ?Target Date: 2022-04-28 ?Frequency: Daily ?Modality: individual ?Progress: 70%  ?Related Problem: Recognize, accept, and cope with feelings of depression. ?Description: Verbalize an understanding of healthy and unhealthy emotions with the intent of increasing the use of healthy emotions to guide actions. ?Target Date: 2022-04-28 ?Frequency: Daily ?Modality: individual ?Progress: 70%  ?Related Problem: Recognize, accept, and cope with feelings of depression. ?Description: Increasingly verbalize hopeful and positive statements regarding self, others, and the future. ?Target Date: 2022-04-28 ?Frequency: Daily ?Modality: individual ?Progress: 70% ? ?Related Problem: Reduce fear, anxiety, and worry associated with  the medical condition. ?Description: Share with significant others efforts to adapt successfully to the medical condition/diagnosis. ?Target Date: 2022-04-28 ?Frequency: Daily ?Modality: individual ?Progress: 80%  ?Related Problem: Reduce fear, anxiety, and worry associated with the medical condition. ?Description: Engage in social, productive, and recreational activities that are possible in spite of medical condition. ?Target Date: 2022-04-28 ?Frequency: Daily ?Modality: individual ?Progress: 40% ? ?Related Problem: Reduce fear, anxiety, and worry associated with the medical condition. ?Description: Identify the coping skills and sources of emotional support that have been beneficial in the past. ?Target Date: 2022-04-28 ?Frequency: Daily ?Modality: individual ?Progress: 50%  ?Client Response ?full compliance  ?Service Location ?Location, [name] [place], [place] [address]  ?Service Code ?cpt [id number]  ?Related past to present  ?Facilitate problem solving  ?Identify/label emotions  ?Validate/empathize  ?Self care activities  ?Lifestyle change (exercise, nutrition)  ?Assess/facilitate readiness to change  ?Relaxation training  ?Rationally challenge thoughts or beliefs/cognitive restructuring  ?Comments  ?Dx: V40.98  ?Meds: Prozac, Buspar and Remeron.  ?Goals: Following the failure of a third marriage, [name], 2021 (completed). She is also wanting to utilize counseling to adjust and accept her latest diagnosis of cancer. She wants assistance to better manage this news and remain positive  throughout treatment. Goal  date is 12-22. Patient has made progress, but her medical condition is variable and she requests additional treatment to manage moods throughout her medical ordeal. In addition, she is trying to manage feelings associated with her older son's mental health struggles. Revised goal date is 12-23.  ?Patient agrees to have a video session due to the [facility]. She is at home and I am in home office.  ? ?[name] talked about her pride in her son [name] for having graduated from [employer] with honors from the nursing school. He is planning to go for his CRNA. ?She is now attending [employer] and has been very involved. She recognizes that what she and her ex-husband have done is in the best interest of [name]. The 50B is in place for 1 year and they have been encouraged to keep it in place for that entire time. [name] is emotionally stable and is doing well with the current circumstances.                                ?  ? ?[name] Time: 4:15p-5:00 45 minutes   ? ? ? ? ? ?

## 2021-09-25 ENCOUNTER — Telehealth: Payer: Self-pay

## 2021-09-25 NOTE — Patient Instructions (Signed)
DUE TO COVID-19 ONLY TWO VISITORS  (aged 72 and older)  ARE ALLOWED TO COME WITH YOU AND STAY IN THE WAITING ROOM ONLY DURING PRE OP AND PROCEDURE.   ?**NO VISITORS ARE ALLOWED IN THE SHORT STAY AREA OR RECOVERY ROOM!!** ? ?IF YOU WILL BE ADMITTED INTO THE HOSPITAL YOU ARE ALLOWED ONLY FOUR SUPPORT PEOPLE DURING VISITATION HOURS ONLY (7 AM -8PM)   ?The support person(s) must pass our screening, gel in and out, and wear a mask at all times, including in the patient?s room. ?Patients must also wear a mask when staff or their support person are in the room. ?Visitors GUEST BADGE MUST BE WORN VISIBLY  ?One adult visitor may remain with you overnight and MUST be in the room by 8 P.M. ?  ? ? Your procedure is scheduled on: 10/08/21 ? ? Report to [facility] Main Entrance ? ?  Report to admitting at : 9:45 AM ? ? Call this number if you have problems the morning of surgery [phone] ? ? Do not eat food :After Midnight. ? ? After Midnight you may have the following liquids until: 9:00 AM DAY OF SURGERY ? ?Water ?Black Coffee (sugar ok, NO MILK/CREAM OR CREAMERS)  ?Tea (sugar ok, NO MILK/CREAM OR CREAMERS) regular and decaf                             ?Plain Jell-O (NO RED)                                           ?Fruit ices (not with fruit pulp, NO RED)                                     ?Popsicles (NO RED)                                                                  ?Juice: apple, WHITE grape, WHITE cranberry ?Sports drinks like Gatorade (NO RED) ?Clear broth(vegetable,chicken,beef) ?  ?Oral Hygiene is also important to reduce your risk of infection.                                    ?Remember - BRUSH YOUR TEETH THE MORNING OF SURGERY WITH YOUR REGULAR TOOTHPASTE ? ? Do NOT smoke after Midnight ? ? Take these medicines the morning of surgery with A SIP OF WATER: Buspar,fluoxetine,progesterone,ritalin,synthroid,Nexium.Use inhalers as usual.Tylenol as needed. ? ?DO NOT TAKE ANY ORAL DIABETIC MEDICATIONS  DAY OF YOUR SURGERY ? ?Bring CPAP mask and tubing day of surgery. ?                  ?           You may not have any metal on your body including hair pins, jewelry, and body piercing ? ?           Do not wear make-up, lotions, powders, perfumes/cologne, or deodorant ? ?Do not  wear nail polish including gel and S&S, artificial/acrylic nails, or any other type of covering on natural nails including finger and toenails. If you have artificial nails, gel coating, etc. that needs to be removed by a nail salon please have this removed prior to surgery or surgery may need to be canceled/ delayed if the surgeon/ anesthesia feels like they are unable to be safely monitored.  ? ?Do not shave  48 hours prior to surgery.  ? ? Do not bring valuables to the hospital. [place] NOT ?            RESPONSIBLE   FOR VALUABLES. ? ? Contacts, dentures or bridgework may not be worn into surgery. ? ? Bring small overnight bag day of surgery. ?  ? Patients discharged on the day of surgery will not be allowed to drive home.  Someone NEEDS to stay with you for the first 24 hours after anesthesia. ? ? Special Instructions: Bring a copy of your healthcare power of attorney and living will documents         the day of surgery if you haven't scanned them before. ? ?            Please read over the following fact sheets you were given: IF [place] [phone] ? ?   [place] - Preparing for Surgery ?Before surgery, you can play an important role.  Because skin is not sterile, your skin needs to be as free of germs as possible.  You can reduce the number of germs on your skin by washing with CHG (chlorahexidine gluconate) soap before surgery.  CHG is an antiseptic cleaner which kills germs and bonds with the skin to continue killing germs even after washing. ?Please DO NOT use if you have an allergy to CHG or antibacterial soaps.  If your skin becomes reddened/irritated stop using the  CHG and inform your nurse when you arrive at Short Stay. ?Do not shave (including legs and underarms) for at least 48 hours prior to the first CHG shower.  You may shave your face/neck. ?Please follow these instructions carefully: ? 1.  Shower with CHG Soap the night before surgery and the  morning of Surgery. ? 2.  If you choose to wash your hair, wash your hair first as usual with your  normal  shampoo. ? 3.  After you shampoo, rinse your hair and body thoroughly to remove the  shampoo.                           4.  Use CHG as you would any other liquid soap.  You can apply chg directly  to the skin and wash  ?                     Gently with a scrungie or clean washcloth. ? 5.  Apply the CHG Soap to your body ONLY FROM THE NECK DOWN.   Do not use on face/ open      ?                     Wound or open sores. Avoid contact with eyes, ears mouth and genitals (private parts).  ?                     Production manager,  Genitals (private parts) with your normal soap. ?  6.  Wash thoroughly, paying special attention to the area where your surgery  will be performed. ? 7.  Thoroughly rinse your body with warm water from the neck down. ? 8.  DO NOT shower/wash with your normal soap after using and rinsing off  the CHG Soap. ?               9.  Pat yourself dry with a clean towel. ?           10.  Wear clean pajamas. ?           11.  Place clean sheets on your bed the night of your first shower and do not  sleep with pets. ?Day of Surgery : ?Do not apply any lotions/deodorants the morning of surgery.  Please wear clean clothes to the hospital/surgery center. ? ?FAILURE TO FOLLOW THESE INSTRUCTIONS MAY RESULT IN THE CANCELLATION OF YOUR SURGERY ?PATIENT SIGNATURE_________________________________ ? ?NURSE SIGNATURE__________________________________ ? ?________________________________________________________________________  ?

## 2021-09-25 NOTE — Telephone Encounter (Signed)
Pt is requesting a refill on: ?ALPRAZolam (XANAX) 0.5 MG tablet ?busPIRone (BUSPAR) 30 MG tablet ?colesevelam (WELCHOL) 625 MG tablet ?D-Mannose 500 MG CAPS ?Omega-3 Fatty Acids (FISH OIL) 1000 MG CPDR ?FLUoxetine (PROZAC) 40 MG capsule ?furosemide (LASIX) 20 MG tablet ?iron polysaccharides (FERREX 150) 150 MG capsule ?levothyroxine (SYNTHROID) 100 MCG tablet ?mirtazapine (REMERON) 30 MG tablet ?progesterone (PROMETRIUM) 100 MG capsule ? ?Pharmacy: ?[facility] ) - [place], [place] ? ?LOV 07/06/21 ? ?

## 2021-09-27 ENCOUNTER — Other Ambulatory Visit: Payer: Self-pay | Admitting: Internal Medicine

## 2021-09-27 MED ORDER — OMEGA-3-ACID ETHYL ESTERS 1 G PO CAPS: 2.0000 g | ORAL_CAPSULE | Freq: Two times a day (BID) | ORAL | 2 refills | Status: DC

## 2021-09-27 MED ORDER — ALPRAZOLAM 0.5 MG PO TABS: 0.5000 mg | ORAL_TABLET | Freq: Two times a day (BID) | ORAL | 3 refills | Status: DC | PRN

## 2021-09-27 MED ORDER — BUSPIRONE HCL 30 MG PO TABS: 30.0000 mg | ORAL_TABLET | Freq: Two times a day (BID) | ORAL | 2 refills | Status: DC

## 2021-09-27 MED ORDER — LEVOTHYROXINE SODIUM 100 MCG PO TABS: 100.0000 ug | ORAL_TABLET | Freq: Every day | ORAL | 3 refills | Status: DC

## 2021-09-27 MED ORDER — FUROSEMIDE 20 MG PO TABS: 20.0000 mg | ORAL_TABLET | Freq: Every day | ORAL | 2 refills | Status: DC | PRN

## 2021-09-27 MED ORDER — MIRTAZAPINE 30 MG PO TABS: 30.0000 mg | ORAL_TABLET | Freq: Every day | ORAL | 1 refills | Status: DC

## 2021-09-27 MED ORDER — FLUOXETINE HCL 40 MG PO CAPS: 40.0000 mg | ORAL_CAPSULE | Freq: Every morning | ORAL | 1 refills | Status: DC

## 2021-09-27 MED ORDER — POLYSACCHARIDE IRON COMPLEX 150 MG PO CAPS: 150.0000 mg | ORAL_CAPSULE | Freq: Two times a day (BID) | ORAL | 3 refills | Status: DC

## 2021-09-27 MED ORDER — D-MANNOSE 500 MG PO CAPS: 500.0000 mg | ORAL_CAPSULE | Freq: Two times a day (BID) | ORAL | 3 refills | Status: DC

## 2021-09-27 MED ORDER — COLESEVELAM HCL 625 MG PO TABS: 1250.0000 mg | ORAL_TABLET | Freq: Two times a day (BID) | ORAL | 3 refills | Status: DC

## 2021-09-27 NOTE — Telephone Encounter (Signed)
Done. Thanks.

## 2021-09-28 ENCOUNTER — Encounter (HOSPITAL_COMMUNITY)
Admission: RE | Admit: 2021-09-28 | Discharge: 2021-09-28 | Disposition: A | Discharge status: Home / Self Care | Payer: Medicare Other | Source: Ambulatory Visit | Attending: General Surgery | Admitting: General Surgery

## 2021-09-28 ENCOUNTER — Encounter (HOSPITAL_COMMUNITY): Payer: Self-pay

## 2021-09-28 DIAGNOSIS — I2510 Atherosclerotic heart disease of native coronary artery without angina pectoris: Secondary | ICD-10-CM | POA: Insufficient documentation

## 2021-09-28 DIAGNOSIS — Z01812 Encounter for preprocedural laboratory examination: Secondary | ICD-10-CM | POA: Diagnosis not present

## 2021-09-28 DIAGNOSIS — Z01818 Encounter for other preprocedural examination: Secondary | ICD-10-CM

## 2021-09-28 DIAGNOSIS — R55 Syncope and collapse: Secondary | ICD-10-CM

## 2021-09-28 HISTORY — DX: Dyspnea, unspecified: R06.00

## 2021-09-28 NOTE — Progress Notes (Signed)
For Short Stay: ?[place] appointment date: ?Date of COVID positive in last 90 days: ? ?Bowel Prep reminder: ? ? ?For Anesthesia: ?PCP - Dr. Eleanora Neighbor Plotnikov ?Cardiologist - Dr. Shirlee More ? ?Chest x-ray -:10/28/21 CT cheat: 08/06/21 ?EKG - 07/29/21 ?Stress Test -  ?ECHO - 08/20/17 ?Cardiac Cath - 08/26/17 ?Pacemaker/ICD device last checked: ?Pacemaker orders received: ?Device Rep notified: ? ?Spinal Cord Stimulator: ? ?Sleep Study -  ?CPAP -  ? ?Fasting Blood Sugar -  ?Checks Blood Sugar _____ times a day ?Date and result of last Hgb A1c- ? ?Blood Thinner Instructions: ?Aspirin Instructions: ?Last Dose: ? ?Activity level: Can go up a flight of stairs and activities of daily living without stopping and without chest pain and/or shortness of breath ?  Able to exercise without chest pain and/or shortness of breath ?  Unable to go up a flight of stairs without chest pain and/or shortness of breath ?   ? ?Anesthesia review: Hx: CKD III,DVT,HTN ? ?Patient denies shortness of breath, fever, cough and chest pain at PAT appointment ? ? ?Patient verbalized understanding of instructions that were given to them at the PAT appointment. Patient was also instructed that they will need to review over the PAT instructions again at home before surgery.  ?

## 2021-09-29 ENCOUNTER — Encounter (HOSPITAL_COMMUNITY)
Admission: RE | Admit: 2021-09-29 | Discharge: 2021-09-29 | Disposition: A | Discharge status: Home / Self Care | Payer: Medicare Other | Source: Ambulatory Visit | Attending: General Surgery | Admitting: General Surgery

## 2021-09-29 DIAGNOSIS — Z01818 Encounter for other preprocedural examination: Secondary | ICD-10-CM

## 2021-09-29 DIAGNOSIS — I2510 Atherosclerotic heart disease of native coronary artery without angina pectoris: Secondary | ICD-10-CM | POA: Insufficient documentation

## 2021-09-29 DIAGNOSIS — Z01812 Encounter for preprocedural laboratory examination: Secondary | ICD-10-CM | POA: Insufficient documentation

## 2021-09-29 LAB — CBC
HCT: 38.4 % (ref 36.0–46.0)
Hemoglobin: 13.1 g/dL (ref 12.0–15.0)
MCH: 31.6 pg (ref 26.0–34.0)
MCHC: 34.1 g/dL (ref 30.0–36.0)
MCV: 92.8 fL (ref 80.0–100.0)
Platelets: 311 10*3/uL (ref 150–400)
RBC: 4.14 MIL/uL (ref 3.87–5.11)
RDW: 13 % (ref 11.5–15.5)
WBC: 9.6 10*3/uL (ref 4.0–10.5)
nRBC: 0 % (ref 0.0–0.2)

## 2021-09-29 LAB — BASIC METABOLIC PANEL
Anion gap: 9 (ref 5–15)
BUN: 26 mg/dL — ABNORMAL HIGH (ref 8–23)
CO2: 21 mmol/L — ABNORMAL LOW (ref 22–32)
Calcium: 10.5 mg/dL — ABNORMAL HIGH (ref 8.9–10.3)
Chloride: 107 mmol/L (ref 98–111)
Creatinine, Ser: 1.44 mg/dL — ABNORMAL HIGH (ref 0.44–1.00)
GFR, Estimated: 39 mL/min — ABNORMAL LOW (ref >60)
Glucose, Bld: 111 mg/dL — ABNORMAL HIGH (ref 70–99)
Potassium: 4 mmol/L (ref 3.5–5.1)
Sodium: 137 mmol/L (ref 135–145)

## 2021-09-30 ENCOUNTER — Ambulatory Visit: Payer: Medicare Other | Admitting: Psychology

## 2021-10-01 ENCOUNTER — Ambulatory Visit: Payer: Medicare Other | Admitting: Internal Medicine

## 2021-10-01 ENCOUNTER — Telehealth: Payer: Self-pay

## 2021-10-01 ENCOUNTER — Other Ambulatory Visit: Payer: Self-pay

## 2021-10-01 ENCOUNTER — Telehealth: Payer: Self-pay | Admitting: Internal Medicine

## 2021-10-01 MED ORDER — LISINOPRIL 20 MG PO TABS: 10.0000 mg | ORAL_TABLET | Freq: Every day | ORAL | 3 refills | Status: DC

## 2021-10-01 NOTE — Progress Notes (Signed)
Chronic Care Management Pharmacy Assistant   Name: [name]  MRN: [id number] DOB: January 19, 1951    Reason for Encounter: Disease State-Adherence Call   Recent office visits:  None since the last coordination call 08/31/21  Recent consult visits:  09/03/21 Charlotte Priest, MD-Cardiology (Hypertension, Chronic kidney disease) No orders or Medication changes  Hospital visits:  Charlotte Priest, MD  Medications: Outpatient Encounter Medications as of 10/01/2021  Medication Sig   acetaminophen (TYLENOL) 325 MG tablet Take 2 tablets (650 mg total) by mouth every 6 (six) hours as needed for moderate pain.   ALPRAZolam (XANAX) 0.5 MG tablet Take 1 tablet (0.5 mg total) by mouth 2 (two) times daily as needed for anxiety.   aspirin EC 81 MG tablet Take 1 tablet (81 mg total) by mouth daily.   busPIRone (BUSPAR) 30 MG tablet Take 1 tablet (30 mg total) by mouth 2 (two) times daily.   Cholecalciferol (D3 5000) 125 MCG (5000 UT) capsule Take 5,000 Units by mouth daily.   colesevelam (WELCHOL) 625 MG tablet Take 2 tablets (1,250 mg total) by mouth 2 (two) times daily with a meal.   D-Mannose 500 MG CAPS Take 500 mg by mouth 2 (two) times daily. WITH CRANBERRY & DANDELION EXTRACT   diphenoxylate-atropine (LOMOTIL) 2.5-0.025 MG tablet Take 1 tablet by mouth 4 (four) times daily as needed for diarrhea or loose stools.   esomeprazole (NEXIUM) 40 MG capsule TAKE 1 CAPSULE BY MOUTH DAILY IN THE MORNING   FLUoxetine (PROZAC) 40 MG capsule Take 1 capsule (40 mg total) by mouth every morning.   furosemide (LASIX) 20 MG tablet Take 1-2 tablets (20-40 mg total) by mouth daily as needed for edema. TAKE 1 TO 2 TABLETS BY MOUTH  DAILY AS DIRECTED   ipratropium-albuterol (DUONEB) 0.5-2.5 (3) MG/3ML SOLN Take 3 mLs by nebulization every 6 (six) hours as needed (Asthma).   iron polysaccharides (FERREX 150) 150 MG capsule Take 1 capsule (150 mg total) by mouth 2 (two) times daily.   levothyroxine  (SYNTHROID) 100 MCG tablet Take 1 tablet (100 mcg total) by mouth daily before breakfast.   lisinopril (ZESTRIL) 20 MG tablet Take 0.5 tablets (10 mg total) by mouth daily.   loperamide (IMODIUM A-D) 2 MG tablet Take 2 mg by mouth 4 (four) times daily as needed for diarrhea or loose stools.   METAMUCIL FIBER PO Take 1 capsule by mouth in the morning and at bedtime.   methylphenidate (RITALIN) 5 MG tablet Take 1 tablet (5 mg total) by mouth 2 (two) times daily with breakfast and lunch. (Patient taking differently: Take 5 mg by mouth daily.)   mirtazapine (REMERON) 30 MG tablet Take 1 tablet (30 mg total) by mouth at bedtime.   omega-3 acid ethyl esters (LOVAZA) 1 g capsule Take 2 capsules (2 g total) by mouth 2 (two) times daily.   OVER THE COUNTER MEDICATION Take 1 capsule by mouth daily. 7 MUSHROOM BLEND   Polyethyl Glycol-Propyl Glycol (SYSTANE OP) Place 1 drop into both eyes 2 (two) times daily as needed (dry eyes).    progesterone (PROMETRIUM) 100 MG capsule Take 100 mg by mouth daily.   sodium chloride (OCEAN) 0.65 % SOLN nasal spray Place 1 spray into both nostrils as needed for congestion.   traMADol (ULTRAM) 50 MG tablet Take 50 mg by mouth every 6 (six) hours as needed for moderate pain.   vitamin B-12 (CYANOCOBALAMIN) 1000 MCG tablet Take 1,000 mcg by mouth daily.   vitamin  C (ASCORBIC ACID) 500 MG tablet Take 500 mg by mouth daily.   [DISCONTINUED] famotidine (PEPCID) 40 MG tablet TAKE 1 TABLET BY MOUTH EVERY DAY IN THE EVENING (Patient not taking: No sig reported)   No facility-administered encounter medications on file as of 10/01/2021.   [place] for General Review Call   Chart Review:  Have there been any documented new, changed, or discontinued medications since last visit? No (If yes, include name, dose, frequency, date) Has there been any documented recent hospitalizations or ED visits since last visit with Clinical Pharmacist? No Brief Summary  (including medication and/or Diagnosis changes):   Adherence Review:  Does the Clinical Pharmacist Assistant have access to adherence rates? Yes Adherence rates for STAR metric medications (List medication(s)/day supply/ last 2 fill dates). Adherence rates for medications indicated for disease state being reviewed (List medication(s)/day supply/ last 2 fill dates). Does the patient have >5 day gap between last estimated fill dates for any of the above medications or other medication gaps? No Reason for medication gaps.   Disease State Questions:  Able to connect with Patient? Yes  Did patient have any problems with their health recently? No, patient states that she is not having any health issues today   Have you had any admissions or emergency room visits or worsening of your condition(s) since last visit? No  Have you had any visits with new specialists or providers since your last visit? Yes, patient had appt with Dr. Bettina Grant the cardiologist 09/03/21 f/u visit  Have you had any new health care problem(s) since your last visit? No, patient states not since last coordination call   Have you run out of any of your medications since you last spoke with clinical pharmacist? Yes What caused you to run out of your medications?Patient stated that she called Plotnikov's office but was unable to get through  Are there any medications you are not taking as prescribed? No   Are you having any issues or side effects with your medications? No, patient states that she is not having any side effects to medications   Do you have any other health concerns or questions you want to discuss with your Clinical Pharmacist before your next visit? No.  Are there any health concerns that you feel we can do a better job addressing? No   Are you having any problems with any of the following since the last visit: (select all that apply)  None   12. Any falls since last visit? No   13. Any increased  or uncontrolled pain since last visit? No   14. Next visit Type: telephone       Visit with:Clinical Pharmacist        Date:10/29/21        Time:10 am  15. Additional Details? No   Care Gaps: Colonoscopy-05/12/16 Diabetic Foot Exam-NA Mammogram-06/15/10 Ophthalmology-NA Dexa Scan - NA Annual Well Visit -NA  Micro albumin-NA Hemoglobin A1c- NA   Star Rating Drugs: Lisinopril 20 mg-last fill 07/19/21 90 ds   [place] Pharmacist Assistant [phone]

## 2021-10-01 NOTE — Telephone Encounter (Signed)
Patients need the following refills on her medications:  Lisinopril - [facility] send to [facility] on [address].

## 2021-10-01 NOTE — Telephone Encounter (Signed)
PT calls today in regards to a refill for their methylphenidate (RITALIN) 5 mg. PT is aware that this might take a couple days to be filled. If possible she would like [facility] [place] on [facility]'s Chart.  CB: [phone]  Also PT had cancelled upcoming appointment with Dr.John regarding her left rib pain due to feeling better (She believes it was just a pulled muscle that needed stretched out)

## 2021-10-02 MED ORDER — METHYLPHENIDATE HCL 5 MG PO TABS: 5.0000 mg | ORAL_TABLET | Freq: Two times a day (BID) | ORAL | 0 refills | Status: DC

## 2021-10-02 NOTE — Telephone Encounter (Signed)
Okay.  Done.  Thanks 

## 2021-10-05 ENCOUNTER — Ambulatory Visit (INDEPENDENT_AMBULATORY_CARE_PROVIDER_SITE_OTHER): Payer: Medicare Other | Admitting: Psychology

## 2021-10-05 ENCOUNTER — Institutional Professional Consult (permissible substitution): Payer: Medicare Other | Admitting: Diagnostic Neuroimaging

## 2021-10-05 DIAGNOSIS — F4323 Adjustment disorder with mixed anxiety and depressed mood: Secondary | ICD-10-CM | POA: Diagnosis not present

## 2021-10-05 NOTE — Progress Notes (Signed)
ARUNA NESTLER is a 71 y.o. 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 (Status: maintained) -- No Description Entered  Diminished interest in or enjoyment of activities. (Status: maintained) -- No Description Entered  Lack of energy. (Status: maintained) -- No Description Entered  Sad affect, social withdrawal, anxiety, loss of interest in activities, and low energy. (Status: maintained) -- No Description Entered  Medication Status compliance  Safety unspecified If Suicidal or Homicidal State Action Taken: unspecified  Current Risk:  Medications Buspar (Dosage: 30mg  2X/day)  New Medication (Dosage: 250mg  2x/day)  New Medication (Dosage: 250mg  2x/day)  Prozac (Dosage: 40mg  daily)  Remeron (Dosage: 30mg  nightly)  xanax (Dosage: .5mg  2-3X/day)  Objectives Related Problem: Recognize, accept, and cope with feelings of depression. Description: Identify and replace thoughts and beliefs that support depression. Target Date: 2022-04-28 Frequency: Daily Modality: individual Progress: 70%  Related Problem: Recognize, accept, and cope with feelings of depression. Description: Verbalize an understanding of healthy and unhealthy emotions with the intent of increasing the use of healthy emotions to guide actions. Target Date: 2022-04-28 Frequency: Daily Modality: individual Progress: 70%  Related Problem: Recognize, accept, and cope with feelings of depression. Description: Increasingly verbalize hopeful and positive statements regarding self, others, and the future. Target Date: 2022-04-28 Frequency: Daily Modality: individual Progress: 70%  Related Problem: Reduce fear,  anxiety, and worry associated with the medical condition. Description: Share with significant others efforts to adapt successfully to the medical condition/diagnosis. Target Date: 2022-04-28 Frequency: Daily Modality: individual Progress: 80%  Related Problem: Reduce fear, anxiety, and worry associated with the medical condition. Description: Engage in social, productive, and recreational activities that are possible in spite of medical condition. Target Date: 2022-04-28 Frequency: Daily Modality: individual Progress: 40%  Related Problem: Reduce fear, anxiety, and worry associated with the medical condition. Description: Identify the coping skills and sources of emotional support that have been beneficial in the past. Target Date: 2022-04-28 Frequency: Daily Modality: individual Progress: 50%  Client Response full compliance  Service Location Location, [name] [place], [place] [address]  Service Code cpt [phone]  Related past to present  Facilitate problem solving  Identify/label emotions  Validate/empathize  Self care activities  Lifestyle change (exercise, nutrition)  Assess/facilitate readiness to change  Relaxation training  Rationally challenge thoughts or beliefs/cognitive restructuring  Comments  Dx: F43.21  Meds: Prozac, [name], [name], 2021 (completed). She is also wanting to utilize counseling to adjust and accept her latest diagnosis of cancer.  She wants assistance to better  manage this news and remain positive throughout treatment. Goal date is 12-22. Patient has made progress, but her medical condition is variable and she requests additional treatment to manage moods throughout her medical ordeal. In addition, she is trying to manage feelings associated with her older son's mental health struggles. Revised goal date is 12-23.  Patient agrees to have a video session due to the [facility]. She is at home and I am in home office.   Everlena Mackley is having surgery to remove para thyroid on Thursday. She will be in the hospital over night. The thinking is that may help her get past some of the chronic illness she has been experiencing. She apparently has 15 of the 20 diseases linked to para thyroid disease. She is encouraged that this will help her overall health. States that after she recovers, she will start going to [employer]. Has been reading the book and looking at on line meetings. Wants to start to attend in person at least once a week. She continues to grieve the situation that [name] is in and is chronically fearful of what will happen to him. Discussed how to manage her despair.                                       Aidon Klemens LEWIS Time: 4:00p-4:45 45 minutes

## 2021-10-08 ENCOUNTER — Encounter (HOSPITAL_COMMUNITY): Payer: Self-pay | Admitting: General Surgery

## 2021-10-08 ENCOUNTER — Other Ambulatory Visit: Payer: Self-pay

## 2021-10-08 ENCOUNTER — Ambulatory Visit (HOSPITAL_COMMUNITY): Payer: Medicare Other | Admitting: Anesthesiology

## 2021-10-08 ENCOUNTER — Ambulatory Visit (HOSPITAL_BASED_OUTPATIENT_CLINIC_OR_DEPARTMENT_OTHER): Payer: Medicare Other | Admitting: Anesthesiology

## 2021-10-08 ENCOUNTER — Encounter (HOSPITAL_COMMUNITY)
Admission: RE | Disposition: A | Discharge status: Home / Self Care | Payer: Self-pay | Source: Home / Self Care | Attending: General Surgery

## 2021-10-08 ENCOUNTER — Observation Stay (HOSPITAL_COMMUNITY)
Admission: RE | Admit: 2021-10-08 | Discharge: 2021-10-09 | Disposition: A | Discharge status: Home / Self Care | Payer: Medicare Other | Attending: General Surgery | Admitting: General Surgery

## 2021-10-08 DIAGNOSIS — I129 Hypertensive chronic kidney disease with stage 1 through stage 4 chronic kidney disease, or unspecified chronic kidney disease: Secondary | ICD-10-CM | POA: Diagnosis not present

## 2021-10-08 DIAGNOSIS — Z79899 Other long term (current) drug therapy: Secondary | ICD-10-CM | POA: Insufficient documentation

## 2021-10-08 DIAGNOSIS — Z86718 Personal history of other venous thrombosis and embolism: Secondary | ICD-10-CM | POA: Diagnosis not present

## 2021-10-08 DIAGNOSIS — E210 Primary hyperparathyroidism: Secondary | ICD-10-CM | POA: Diagnosis not present

## 2021-10-08 DIAGNOSIS — Z7982 Long term (current) use of aspirin: Secondary | ICD-10-CM | POA: Insufficient documentation

## 2021-10-08 DIAGNOSIS — I10 Essential (primary) hypertension: Secondary | ICD-10-CM | POA: Diagnosis not present

## 2021-10-08 DIAGNOSIS — N189 Chronic kidney disease, unspecified: Secondary | ICD-10-CM | POA: Diagnosis not present

## 2021-10-08 DIAGNOSIS — E213 Hyperparathyroidism, unspecified: Secondary | ICD-10-CM | POA: Diagnosis present

## 2021-10-08 DIAGNOSIS — N289 Disorder of kidney and ureter, unspecified: Secondary | ICD-10-CM

## 2021-10-08 DIAGNOSIS — D351 Benign neoplasm of parathyroid gland: Secondary | ICD-10-CM | POA: Diagnosis not present

## 2021-10-08 DIAGNOSIS — M797 Fibromyalgia: Secondary | ICD-10-CM

## 2021-10-08 DIAGNOSIS — M1990 Unspecified osteoarthritis, unspecified site: Secondary | ICD-10-CM | POA: Diagnosis not present

## 2021-10-08 HISTORY — PX: PARATHYROIDECTOMY: SHX19

## 2021-10-08 LAB — TYPE AND SCREEN
ABO/RH(D): O POS
Antibody Screen: NEGATIVE

## 2021-10-08 LAB — CBC
HCT: 36.5 % (ref 36.0–46.0)
Hemoglobin: 11.7 g/dL — ABNORMAL LOW (ref 12.0–15.0)
MCH: 30.1 pg (ref 26.0–34.0)
MCHC: 32.1 g/dL (ref 30.0–36.0)
MCV: 93.8 fL (ref 80.0–100.0)
Platelets: 276 10*3/uL (ref 150–400)
RBC: 3.89 MIL/uL (ref 3.87–5.11)
RDW: 13.3 % (ref 11.5–15.5)
WBC: 8.3 10*3/uL (ref 4.0–10.5)
nRBC: 0 % (ref 0.0–0.2)

## 2021-10-08 LAB — CREATININE, SERUM
Creatinine, Ser: 1.51 mg/dL — ABNORMAL HIGH (ref 0.44–1.00)
GFR, Estimated: 37 mL/min — ABNORMAL LOW (ref >60)

## 2021-10-08 SURGERY — PARATHYROIDECTOMY
Anesthesia: General | Site: Neck

## 2021-10-08 MED ORDER — ALPRAZOLAM 0.5 MG PO TABS: 0.5000 mg | ORAL_TABLET | Freq: Two times a day (BID) | ORAL | Status: DC | PRN

## 2021-10-08 MED ORDER — ONDANSETRON HCL 4 MG/2ML IJ SOLN
INTRAMUSCULAR | Status: AC
  Filled 2021-10-08: qty 2

## 2021-10-08 MED ORDER — ACETAMINOPHEN 500 MG PO TABS: 1000.0000 mg | ORAL_TABLET | ORAL | Status: DC

## 2021-10-08 MED ORDER — MIDAZOLAM HCL 2 MG/2ML IJ SOLN
INTRAMUSCULAR | Status: AC
Start: 2021-10-08 — End: ?
  Filled 2021-10-08: qty 2

## 2021-10-08 MED ORDER — CHLORHEXIDINE GLUCONATE CLOTH 2 % EX PADS: 6.0000 | MEDICATED_PAD | Freq: Once | CUTANEOUS | Status: DC

## 2021-10-08 MED ORDER — ONDANSETRON HCL 4 MG/2ML IJ SOLN: 4.0000 mg | Freq: Four times a day (QID) | INTRAMUSCULAR | Status: DC | PRN

## 2021-10-08 MED ORDER — ENOXAPARIN SODIUM 40 MG/0.4ML IJ SOSY
40.0000 mg | PREFILLED_SYRINGE | INTRAMUSCULAR | Status: DC
  Administered 2021-10-09: 40 mg via SUBCUTANEOUS
  Filled 2021-10-08: qty 0.4

## 2021-10-08 MED ORDER — DEXAMETHASONE SODIUM PHOSPHATE 10 MG/ML IJ SOLN
INTRAMUSCULAR | Status: AC
  Filled 2021-10-08: qty 1

## 2021-10-08 MED ORDER — CIPROFLOXACIN IN D5W 400 MG/200ML IV SOLN
400.0000 mg | INTRAVENOUS | Status: AC
  Administered 2021-10-08: 400 mg via INTRAVENOUS

## 2021-10-08 MED ORDER — OXYCODONE HCL 5 MG/5ML PO SOLN: 5.0000 mg | Freq: Once | ORAL | Status: DC | PRN

## 2021-10-08 MED ORDER — HYDROMORPHONE HCL 1 MG/ML IJ SOLN: 0.2500 mg | INTRAMUSCULAR | Status: DC | PRN

## 2021-10-08 MED ORDER — PHENYLEPHRINE 80 MCG/ML (10ML) SYRINGE FOR IV PUSH (FOR BLOOD PRESSURE SUPPORT)
PREFILLED_SYRINGE | INTRAVENOUS | Status: AC
  Filled 2021-10-08: qty 10

## 2021-10-08 MED ORDER — CIPROFLOXACIN IN D5W 400 MG/200ML IV SOLN
INTRAVENOUS | Status: AC
Start: 2021-10-08 — End: 2021-10-08
  Filled 2021-10-08: qty 200

## 2021-10-08 MED ORDER — BUSPIRONE HCL 5 MG PO TABS
30.0000 mg | ORAL_TABLET | Freq: Two times a day (BID) | ORAL | Status: DC
  Administered 2021-10-08 – 2021-10-09 (×2): 30 mg via ORAL
  Filled 2021-10-08 (×2): qty 6

## 2021-10-08 MED ORDER — LIDOCAINE HCL (PF) 2 % IJ SOLN
INTRAMUSCULAR | Status: AC
  Filled 2021-10-08: qty 10

## 2021-10-08 MED ORDER — ENSURE PRE-SURGERY PO LIQD: 296.0000 mL | Freq: Once | ORAL | Status: DC

## 2021-10-08 MED ORDER — CHLORHEXIDINE GLUCONATE 0.12 % MT SOLN
15.0000 mL | Freq: Once | OROMUCOSAL | Status: AC
  Administered 2021-10-08: 15 mL via OROMUCOSAL

## 2021-10-08 MED ORDER — FLUOXETINE HCL 20 MG PO CAPS
40.0000 mg | ORAL_CAPSULE | Freq: Every morning | ORAL | Status: DC
  Administered 2021-10-09: 40 mg via ORAL
  Filled 2021-10-08: qty 2

## 2021-10-08 MED ORDER — LACTATED RINGERS IV SOLN: INTRAVENOUS | Status: DC

## 2021-10-08 MED ORDER — ORAL CARE MOUTH RINSE: 15.0000 mL | Freq: Once | OROMUCOSAL | Status: AC

## 2021-10-08 MED ORDER — ACETAMINOPHEN 500 MG PO TABS
1000.0000 mg | ORAL_TABLET | Freq: Four times a day (QID) | ORAL | Status: DC
  Administered 2021-10-08 – 2021-10-09 (×3): 1000 mg via ORAL
  Filled 2021-10-08 (×3): qty 2

## 2021-10-08 MED ORDER — DIPHENHYDRAMINE HCL 12.5 MG/5ML PO ELIX: 12.5000 mg | ORAL_SOLUTION | Freq: Four times a day (QID) | ORAL | Status: DC | PRN

## 2021-10-08 MED ORDER — LIDOCAINE 2% (20 MG/ML) 5 ML SYRINGE
INTRAMUSCULAR | Status: DC | PRN
  Administered 2021-10-08: 60 mg via INTRAVENOUS

## 2021-10-08 MED ORDER — TRAMADOL HCL 50 MG PO TABS
50.0000 mg | ORAL_TABLET | Freq: Four times a day (QID) | ORAL | Status: DC | PRN
  Administered 2021-10-08 (×2): 50 mg via ORAL
  Filled 2021-10-08 (×2): qty 1

## 2021-10-08 MED ORDER — ROCURONIUM BROMIDE 10 MG/ML (PF) SYRINGE
PREFILLED_SYRINGE | INTRAVENOUS | Status: DC | PRN
  Administered 2021-10-08: 50 mg via INTRAVENOUS

## 2021-10-08 MED ORDER — SUGAMMADEX SODIUM 200 MG/2ML IV SOLN
INTRAVENOUS | Status: DC | PRN
  Administered 2021-10-08: 150 mg via INTRAVENOUS

## 2021-10-08 MED ORDER — ONDANSETRON 4 MG PO TBDP: 4.0000 mg | ORAL_TABLET | Freq: Four times a day (QID) | ORAL | Status: DC | PRN

## 2021-10-08 MED ORDER — ACETAMINOPHEN 500 MG PO TABS: 1000.0000 mg | ORAL_TABLET | Freq: Once | ORAL | Status: AC

## 2021-10-08 MED ORDER — FENTANYL CITRATE PF 50 MCG/ML IJ SOSY: 25.0000 ug | PREFILLED_SYRINGE | INTRAMUSCULAR | Status: DC | PRN

## 2021-10-08 MED ORDER — BUPIVACAINE-EPINEPHRINE (PF) 0.25% -1:200000 IJ SOLN
INTRAMUSCULAR | Status: AC
  Filled 2021-10-08: qty 30

## 2021-10-08 MED ORDER — ROCURONIUM BROMIDE 10 MG/ML (PF) SYRINGE
PREFILLED_SYRINGE | INTRAVENOUS | Status: AC
  Filled 2021-10-08: qty 10

## 2021-10-08 MED ORDER — ACETAMINOPHEN 500 MG PO TABS
ORAL_TABLET | ORAL | Status: AC
  Administered 2021-10-08: 1000 mg via ORAL
  Filled 2021-10-08: qty 2

## 2021-10-08 MED ORDER — FENTANYL CITRATE (PF) 100 MCG/2ML IJ SOLN
INTRAMUSCULAR | Status: DC | PRN
Start: 2021-10-08 — End: 2021-10-08
  Administered 2021-10-08: 100 ug via INTRAVENOUS

## 2021-10-08 MED ORDER — FENTANYL CITRATE (PF) 100 MCG/2ML IJ SOLN
INTRAMUSCULAR | Status: AC
  Filled 2021-10-08: qty 2

## 2021-10-08 MED ORDER — PROPOFOL 10 MG/ML IV BOLUS
INTRAVENOUS | Status: DC | PRN
  Administered 2021-10-08: 140 mg via INTRAVENOUS

## 2021-10-08 MED ORDER — BUPIVACAINE-EPINEPHRINE 0.25% -1:200000 IJ SOLN
INTRAMUSCULAR | Status: DC | PRN
Start: 2021-10-08 — End: 2021-10-08
  Administered 2021-10-08: 15 mL

## 2021-10-08 MED ORDER — OXYCODONE HCL 5 MG PO TABS: 5.0000 mg | ORAL_TABLET | Freq: Once | ORAL | Status: DC | PRN

## 2021-10-08 MED ORDER — LISINOPRIL 10 MG PO TABS
10.0000 mg | ORAL_TABLET | Freq: Every day | ORAL | Status: DC
  Administered 2021-10-08 – 2021-10-09 (×2): 10 mg via ORAL
  Filled 2021-10-08 (×2): qty 1

## 2021-10-08 MED ORDER — DEXAMETHASONE SODIUM PHOSPHATE 10 MG/ML IJ SOLN
INTRAMUSCULAR | Status: DC | PRN
Start: 2021-10-08 — End: 2021-10-08
  Administered 2021-10-08: 10 mg via INTRAVENOUS

## 2021-10-08 MED ORDER — MIRTAZAPINE 15 MG PO TABS
30.0000 mg | ORAL_TABLET | Freq: Every day | ORAL | Status: DC
  Administered 2021-10-08: 30 mg via ORAL
  Filled 2021-10-08 (×2): qty 2

## 2021-10-08 MED ORDER — PHENYLEPHRINE 80 MCG/ML (10ML) SYRINGE FOR IV PUSH (FOR BLOOD PRESSURE SUPPORT)
PREFILLED_SYRINGE | INTRAVENOUS | Status: DC | PRN
  Administered 2021-10-08: 80 ug via INTRAVENOUS
  Administered 2021-10-08: 160 ug via INTRAVENOUS
  Administered 2021-10-08: 80 ug via INTRAVENOUS
  Administered 2021-10-08: 160 ug via INTRAVENOUS
  Administered 2021-10-08 (×2): 80 ug via INTRAVENOUS

## 2021-10-08 MED ORDER — ONDANSETRON HCL 4 MG/2ML IJ SOLN
INTRAMUSCULAR | Status: DC | PRN
  Administered 2021-10-08: 4 mg via INTRAVENOUS

## 2021-10-08 MED ORDER — MENTHOL 3 MG MT LOZG
1.0000 | LOZENGE | OROMUCOSAL | Status: DC | PRN
  Administered 2021-10-08: 3 mg via ORAL
  Filled 2021-10-08: qty 9

## 2021-10-08 MED ORDER — 0.9 % SODIUM CHLORIDE (POUR BTL) OPTIME
TOPICAL | Status: DC | PRN
  Administered 2021-10-08: 1000 mL

## 2021-10-08 MED ORDER — METOPROLOL TARTRATE 5 MG/5ML IV SOLN: 5.0000 mg | Freq: Four times a day (QID) | INTRAVENOUS | Status: DC | PRN

## 2021-10-08 MED ORDER — ONDANSETRON HCL 4 MG/2ML IJ SOLN: 4.0000 mg | Freq: Once | INTRAMUSCULAR | Status: DC | PRN

## 2021-10-08 MED ORDER — LEVOTHYROXINE SODIUM 100 MCG PO TABS
100.0000 ug | ORAL_TABLET | Freq: Every day | ORAL | Status: DC
  Administered 2021-10-09: 100 ug via ORAL
  Filled 2021-10-08: qty 1

## 2021-10-08 MED ORDER — PROPOFOL 10 MG/ML IV BOLUS
INTRAVENOUS | Status: AC
  Filled 2021-10-08: qty 20

## 2021-10-08 MED ORDER — DIPHENHYDRAMINE HCL 50 MG/ML IJ SOLN: 12.5000 mg | Freq: Four times a day (QID) | INTRAMUSCULAR | Status: DC | PRN

## 2021-10-08 MED ORDER — PANTOPRAZOLE SODIUM 40 MG PO TBEC
40.0000 mg | DELAYED_RELEASE_TABLET | Freq: Every day | ORAL | Status: DC
  Administered 2021-10-09: 40 mg via ORAL
  Filled 2021-10-08: qty 1

## 2021-10-08 MED ORDER — MIDAZOLAM HCL 2 MG/2ML IJ SOLN
INTRAMUSCULAR | Status: DC | PRN
  Administered 2021-10-08: 2 mg via INTRAVENOUS

## 2021-10-08 MED ORDER — LIDOCAINE HCL 4 % MT SOLN
OROMUCOSAL | Status: DC | PRN
  Administered 2021-10-08: 4 mL via TOPICAL

## 2021-10-08 SURGICAL SUPPLY — 46 items
ADH SKN CLS APL DERMABOND .7 (GAUZE/BANDAGES/DRESSINGS) ×1
APL PRP STRL LF DISP 70% ISPRP (MISCELLANEOUS) ×1
ATTRACTOMAT 16X20 MAGNETIC DRP (DRAPES) ×2 IMPLANT
BAG COUNTER SPONGE SURGICOUNT (BAG) IMPLANT
BAG SPNG CNTER NS LX DISP (BAG)
BLADE SURG 15 STRL LF DISP TIS (BLADE) ×1 IMPLANT
BLADE SURG 15 STRL SS (BLADE) ×2
CHLORAPREP W/TINT 26 (MISCELLANEOUS) ×2 IMPLANT
CLIP TI MEDIUM 6 (CLIP) ×2 IMPLANT
CLIP TI WIDE RED SMALL 6 (CLIP) ×2 IMPLANT
COVER SURGICAL LIGHT HANDLE (MISCELLANEOUS) ×2 IMPLANT
DERMABOND ADVANCED (GAUZE/BANDAGES/DRESSINGS) ×1
DERMABOND ADVANCED .7 DNX12 (GAUZE/BANDAGES/DRESSINGS) ×1 IMPLANT
DISSECTOR SURG LIGASURE 21 (MISCELLANEOUS) ×1 IMPLANT
DRAPE LAPAROTOMY T 98X78 PEDS (DRAPES) ×2 IMPLANT
DRAPE UTILITY XL STRL (DRAPES) ×1 IMPLANT
ELECT PENCIL ROCKER SW 15FT (MISCELLANEOUS) ×1 IMPLANT
ELECT REM PT RETURN 15FT ADLT (MISCELLANEOUS) ×2 IMPLANT
GAUZE 4X4 16PLY ~~LOC~~+RFID DBL (SPONGE) ×2 IMPLANT
GLOVE BIOGEL PI IND STRL 7.0 (GLOVE) ×1 IMPLANT
GLOVE BIOGEL PI INDICATOR 7.0 (GLOVE) ×1
GLOVE SURG SS PI 7.0 STRL IVOR (GLOVE) ×2 IMPLANT
GOWN STRL REUS W/ TWL LRG LVL3 (GOWN DISPOSABLE) ×1 IMPLANT
GOWN STRL REUS W/ TWL XL LVL3 (GOWN DISPOSABLE) IMPLANT
GOWN STRL REUS W/TWL LRG LVL3 (GOWN DISPOSABLE) ×4
GOWN STRL REUS W/TWL XL LVL3 (GOWN DISPOSABLE)
HEMOSTAT SNOW SURGICEL 2X4 (HEMOSTASIS) ×2 IMPLANT
ILLUMINATOR WAVEGUIDE N/F (MISCELLANEOUS) ×1 IMPLANT
KIT BASIN OR (CUSTOM PROCEDURE TRAY) ×2 IMPLANT
KIT TURNOVER KIT A (KITS) IMPLANT
NDL HYPO 25X1 1.5 SAFETY (NEEDLE) ×1 IMPLANT
NEEDLE HYPO 25X1 1.5 SAFETY (NEEDLE) ×2 IMPLANT
PACK BASIC VI WITH GOWN DISP (CUSTOM PROCEDURE TRAY) ×2 IMPLANT
PENCIL SMOKE EVACUATOR (MISCELLANEOUS) IMPLANT
STAPLER VISISTAT 35W (STAPLE) IMPLANT
SUT MNCRL AB 4-0 PS2 18 (SUTURE) ×2 IMPLANT
SUT SILK 2 0 (SUTURE)
SUT SILK 2-0 18XBRD TIE 12 (SUTURE) IMPLANT
SUT SILK 3 0 (SUTURE)
SUT SILK 3-0 18XBRD TIE 12 (SUTURE) IMPLANT
SUT VIC AB 3-0 SH 18 (SUTURE) ×2 IMPLANT
SYR BULB IRRIG 60ML STRL (SYRINGE) ×2 IMPLANT
SYR CONTROL 10ML LL (SYRINGE) ×2 IMPLANT
TOWEL OR 17X26 10 PK STRL BLUE (TOWEL DISPOSABLE) ×2 IMPLANT
TOWEL OR NON WOVEN STRL DISP B (DISPOSABLE) ×2 IMPLANT
TUBING CONNECTING 10 (TUBING) ×2 IMPLANT

## 2021-10-08 NOTE — Op Note (Signed)
Preoperative diagnosis: primary hyperparathyroidism  Postoperative diagnosis: right inferior parathyroid adenoma  Procedure: excision of right inferior parathyroid gland  Surgeon: Gurney Maxin, M.D.  Asst: Sherlean Foot, RNFA  Anesthesia: GETA  Indications for procedure: [name] is a 71 y.o. year old female with symptoms of kidney stones and bone pain. She had an elevated calcium and PTH and localization study to right inferior parathyroid gland.  Description of procedure: The patient was brought to the operating room and placed in a supine position on the operating room table. Following administration of general anesthesia, the patient was positioned and then prepped and draped in the usual aseptic fashion.   A small Kocher incision was made. Dissection was carried through subcutaneous tissues and platysma. Hemostasis was achieved with the electrocautery. Skin flaps were elevated cephalad and caudad from the thyroid notch to the sternal notch. A Mahorner self-retaining retractor was placed for exposure. Strap muscles were incised in the midline and dissection was begun on the right side.  The strap muscles were dissected away from the thyroid gland. There was a large reddish brown structure just lateral to the thyroid tissue. This was dissected free and removed. One clip was used over the blood supply of the structure. It was sent for frozen pathology urgently. The other areas of parathyroid residence were inspected and no large masses.  After pathology confirmation, [name] surgicel was placed into the dissection plane. The strap muscles were re-apposed with interrupted 3-0 vicryl. The platysma was re-approximated with interrupted 3-0 vicryl. The skin was closed with 4-0 monocryl in running fashion. Dermabond was placed for dressing. The patient woke up and was taken to PACU in stable condition.  Findings: right inferior parathyroid adenoma  Specimen: right inferior parathyroid  gland  Implant: surgicel snow   Blood loss: 10 ml  Local anesthesia:  14 ml marcaine   Complications: none  Gurney Maxin, M.D. General, Bariatric, & Minimally Invasive Surgery [facility]

## 2021-10-08 NOTE — Anesthesia Postprocedure Evaluation (Signed)
Anesthesia Post Note  Patient: [name]  Procedure(s) Performed: PARATHYROIDECTOMY (Neck)     Patient location during evaluation: PACU Anesthesia Type: General Level of consciousness: awake and alert Pain management: pain level controlled Vital Signs Assessment: post-procedure vital signs reviewed and stable Respiratory status: spontaneous breathing, nonlabored ventilation and respiratory function stable Cardiovascular status: stable and blood pressure returned to baseline Anesthetic complications: no   No notable events documented.  Last Vitals:  Vitals:   10/08/21 1445 10/08/21 1531  BP: (!) 155/102 (!) 148/75  Pulse: 84 (!) 45  Resp: 18 18  Temp: (!) 36.3 C (!) 36.3 C  SpO2: 99% 100%    Last Pain:  Vitals:   10/08/21 1531  TempSrc: Oral  PainSc:                  Audry Pili

## 2021-10-08 NOTE — Anesthesia Preprocedure Evaluation (Addendum)
Anesthesia Evaluation  Patient identified by MRN, date of birth, ID band Patient awake    Reviewed: Allergy & Precautions, NPO status , Patient's Chart, lab work & pertinent test results  History of Anesthesia Complications Negative for: history of anesthetic complications  Airway Mallampati: III  TM Distance: >3 FB Neck ROM: Limited    Dental  (+) Dental Advisory Given, Teeth Intact   Pulmonary asthma ,    Pulmonary exam normal        Cardiovascular hypertension, Pt. on medications + DVT  Normal cardiovascular exam     Neuro/Psych  Headaches, PSYCHIATRIC DISORDERS Anxiety Depression Bipolar Disorder  Mild cognitive disorder Frontoparietal cerebral atrophy  Chronic fatigue syndrome     GI/Hepatic Neg liver ROS, GERD  Medicated and Controlled, IBS Hx esophageal stricture s/p dilatation    Endo/Other  Hypothyroidism  Hyperparathroidism   Renal/GU Renal InsufficiencyRenal disease  Female GU complaint  Chronic cystitis     Musculoskeletal  (+) Arthritis , Fibromyalgia -  Abdominal   Peds  Hematology negative hematology ROS (+)   Anesthesia Other Findings HSV  Reproductive/Obstetrics                            Anesthesia Physical Anesthesia Plan  ASA: 3  Anesthesia Plan: General   Post-op Pain Management: Tylenol PO (pre-op)*   Induction: Intravenous  PONV Risk Score and Plan: 3 and Treatment may vary due to age or medical condition, Ondansetron, Propofol infusion and Midazolam  Airway Management Planned: Oral ETT and Video Laryngoscope Planned  Additional Equipment: None  Intra-op Plan:   Post-operative Plan: Extubation in OR  Informed Consent: I have reviewed the patients History and Physical, chart, labs and discussed the procedure including the risks, benefits and alternatives for the proposed anesthesia with the patient or authorized representative who has  indicated his/her understanding and acceptance.     Dental advisory given  Plan Discussed with: CRNA and Anesthesiologist  Anesthesia Plan Comments:        Anesthesia Quick Evaluation

## 2021-10-08 NOTE — Progress Notes (Signed)
Pacu RN Report to floor given  Gave report to  [employer]. Room:1320.   Discussed surgery, meds given in OR and Pacu, VS, IV fluids given, EBL, urine output, pain and other pertinent information. Also discussed if pt had any family or friends here or belongings with them.   Discussed placing ice to area, derma bond to incision, no problems with swallowing or speaking.   Pt exits my care.

## 2021-10-08 NOTE — H&P (Signed)
Chief Complaint: parathyroid removal   History of Present Illness: [name] is a 71 y.o. female who is seen today as an office consultation for evaluation of parathyroid removal .   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 During this time she developed kidney stones and CKD. In the last year she has gone from being lower blood pressure to being hypertensive with difficulty getting control. She was recently diagnosed with osteopenia. She was recently put on ritalin because she was having difficulty focusing and having rapid speech.   Review of Systems: A complete review of systems was obtained from the patient. I have reviewed this information and discussed as appropriate with the patient. See HPI as well for other ROS.  Review of Systems  Constitutional: Negative.  HENT: Negative.  Eyes: Negative.  Respiratory: Negative.  Cardiovascular: Negative.  Gastrointestinal: Negative.  Genitourinary: Negative.  Musculoskeletal: Negative.  Skin: Negative.  Neurological: Negative.  Endo/Heme/Allergies: Negative.  Psychiatric/Behavioral: Negative.    Medical History: Past Medical History:  Diagnosis Date   Anemia   Anxiety   Chronic kidney disease   DVT (deep venous thrombosis) (CMS-HCC)   GERD (gastroesophageal reflux disease)   History of cancer   Hyperlipidemia   Hypertension   Thyroid disease   There is no problem list on file for this patient.  Past Surgical History:  Procedure Laterality Date   bladder surgery   cervical spine surgery c1-7   HYSTERECTOMY   kidney stones surgery   LOBECTOMY PARTIAL THYROID    Allergies  Allergen Reactions   Buprenorphine Hcl Other (See Comments)  Severe headache Severe headache, confusion Severe headache, confusion Severe headache, confusion   Codeine Other (See Comments)  Disoriented Disorientation    Morphine Other (See Comments)  Headache Severe headache   Amlodipine Swelling   Mold Other (See Comments)   Current Outpatient Medications on File Prior to Visit  Medication Sig Dispense Refill   ALPRAZolam (XANAX) 0.5 MG tablet alprazolam 0.5 mg tablet   aspirin 81 MG EC tablet Take by mouth   busPIRone (BUSPAR) 30 MG tablet buspirone 30 mg tablet TAKE 1 TABLET BY MOUTH TWICE A DAY   FLUoxetine (PROZAC) 40 MG capsule fluoxetine 40 mg capsule TAKE 1 CAPSULE BY MOUTH EVERY MORNING   levothyroxine (SYNTHROID) 100 MCG tablet levothyroxine 100 mcg tablet TAKE 1 TAB DAILY BEFORE BREAKFAST. ANNUAL APPT W/LABS ARE DUE. MOST SEE PROVIDER FOR FUTURE REFILLS   lisinopriL (ZESTRIL) 20 MG tablet lisinopril 20 mg tablet TAKE 1 (ONE) TABLET IN THE EVENING   mirtazapine (REMERON) 30 MG tablet mirtazapine 30 mg tablet TAKE 1 TABLET BY MOUTH EVERYDAY AT BEDTIME   albuterol 90 mcg/actuation inhaler albuterol sulfate HFA 90 mcg/actuation aerosol inhaler INHALE 1 2 PUFFS INTO THE LUNGS EVERY 6 (SIX) HOURS AS NEEDED FOR WHEEZING OR SHORTNESS OF BREATH.   B12-methyltetrahydrofolate-B6 1,064mcg-680mcg DFE-1.5 mg Chew B12   cholecalciferol (VITAMIN D3) 5,000 unit capsule Take by mouth   d-mannose 500 mg Cap Take by mouth   estradioL (ESTRACE) 1 MG tablet Take 1 mg by mouth once daily   iron multivitamin (FERREX 150 FORTE) capsule   multivitamin tablet Take by mouth   ondansetron (ZOFRAN-ODT) 4 MG disintegrating tablet ondansetron 4 mg disintegrating tablet TAKE 1 TABLET BY MOUTH EVERY 8 HOURS AS NEEDED FOR NAUSEA AND VOMITING   progesterone (PROMETRIUM) 100 MG capsule progesterone micronized 100  mg capsule 1 po qhs   SUMAtriptan succinate, bulk, 100 % Powd   traMADoL (ULTRAM) 50 mg tablet Take 50 mg by mouth every 6 (six) hours as needed   No current facility-administered medications on file prior to visit.   Family History  Problem Relation Age of Onset   Stroke Mother   Skin cancer Mother   High  blood pressure (Hypertension) Mother   High blood pressure (Hypertension) Father    Social History   Tobacco Use  Smoking Status Never  Smokeless Tobacco Never    Social History   Socioeconomic History   Marital status: Single  Tobacco Use   Smoking status: Never   Smokeless tobacco: Never  Vaping Use   Vaping Use: Never used  Substance and Sexual Activity   Alcohol use: Never   Drug use: Never   Objective:   Vitals:  08/28/21 1020  BP: (!) 148/90  Pulse: 104  Temp: 36.6 C (97.9 F)  SpO2: 97%  Weight: 75.4 kg (166 lb 4 oz)  Height: 157.5 cm (5\' 2" )   Body mass index is 30.41 kg/m.  Physical Exam Constitutional:  Appearance: Normal appearance.  HENT:  Head: Normocephalic and atraumatic.  Pulmonary:  Effort: Pulmonary effort is normal.  Musculoskeletal:  General: Normal range of motion.  Cervical back: Normal range of motion.  Neurological:  General: No focal deficit present.  Mental Status: She is alert and oriented to person, place, and time. Mental status is at baseline.  Psychiatric:  Mood and Affect: Mood normal.  Behavior: Behavior normal.  Thought Content: Thought content normal.    Labs, Imaging and Diagnostic Testing: Ca 10.6 (reference of 11.56 in telephone conversation PTH 141 Sestamibi with localization to the right inferior gland  Assessment and Plan:   Diagnoses and all orders for this visit:  Hypertension, essential - Metanephrines, Pheochromocyt - [facility]  Hyperparathyroidism, primary (CMS-HCC)  Hyperparathyroidism in patient newly developing CKD (so likely primary type) in the last 1-2 years. Given perceived change in blood pressure control, I think pheo screening is warranted to rule out a MEN type syndrome. She does appear to have multiple symptoms from her hypercalcemia with history of stones, now with osteopenia, and some mood changes. I think working towards parathyroidectomy is in her best interested especially with the  location on sestamibi in 08/2020.  We discussed the physiology of the parathyroid gland and hyperparathyroidism. We discussed the surgery would be done under general anesthesia with transverse neck incision, that we would dissect around the thyroid gland and identify the parathyroid gland starting in the area of localization and sending frozen section pathology and possibly needing to perform 4 quadrant exploration. We discussed risks of bleeding, nerve injury including the recurrent laryngeal nerve, infection, pain or swelling. She showed good understanding and wanted to proceed.

## 2021-10-08 NOTE — Transfer of Care (Signed)
Immediate Anesthesia Transfer of Care Note  Patient: [name]  Procedure(s) Performed: PARATHYROIDECTOMY (Neck)  Patient Location: PACU  Anesthesia Type:General  Level of Consciousness: awake, alert  and oriented  Airway & Oxygen Therapy: Patient Spontanous Breathing and Patient connected to face mask oxygen  Post-op Assessment: Report given to RN and Post -op Vital signs reviewed and stable  Post vital signs: Reviewed and stable  Last Vitals:  Vitals Value Taken Time  BP 127/70 10/08/21 1338  Temp    Pulse 78 10/08/21 1339  Resp 14 10/08/21 1339  SpO2 100 % 10/08/21 1339  Vitals shown include unvalidated device data.  Last Pain:  Vitals:   10/08/21 1033  TempSrc:   PainSc: 0-No pain      Patients Stated Pain Goal: 0 (10/16/54 1537)  Complications: No notable events documented.

## 2021-10-08 NOTE — Anesthesia Procedure Notes (Signed)
Procedure Name: Intubation Date/Time: 10/08/2021 12:19 PM Performed by: Sharlette Dense, CRNA Pre-anesthesia Checklist: Patient identified, Emergency Drugs available, Suction available and Patient being monitored Patient Re-evaluated:Patient Re-evaluated prior to induction Oxygen Delivery Method: Circle system utilized Preoxygenation: Pre-oxygenation with 100% oxygen Induction Type: IV induction Ventilation: Mask ventilation without difficulty and Oral airway inserted - appropriate to patient size Laryngoscope Size: Glidescope and 3 Grade View: Grade I Tube type: Parker flex tip Tube size: 7.0 mm Number of attempts: 1 Airway Equipment and Method: Stylet and Oral airway Placement Confirmation: ETT inserted through vocal cords under direct vision, positive ETCO2 and breath sounds checked- equal and bilateral Secured at: 21 cm Tube secured with: Tape Dental Injury: Teeth and Oropharynx as per pre-operative assessment  Difficulty Due To: Difficulty was anticipated, Difficult Airway- due to anterior larynx, Difficult Airway- due to reduced neck mobility and Difficult Airway- due to limited oral opening

## 2021-10-09 ENCOUNTER — Encounter (HOSPITAL_COMMUNITY): Payer: Self-pay | Admitting: General Surgery

## 2021-10-09 DIAGNOSIS — Z86718 Personal history of other venous thrombosis and embolism: Secondary | ICD-10-CM | POA: Diagnosis not present

## 2021-10-09 DIAGNOSIS — I129 Hypertensive chronic kidney disease with stage 1 through stage 4 chronic kidney disease, or unspecified chronic kidney disease: Secondary | ICD-10-CM | POA: Diagnosis not present

## 2021-10-09 DIAGNOSIS — Z79899 Other long term (current) drug therapy: Secondary | ICD-10-CM | POA: Diagnosis not present

## 2021-10-09 DIAGNOSIS — N189 Chronic kidney disease, unspecified: Secondary | ICD-10-CM | POA: Diagnosis not present

## 2021-10-09 DIAGNOSIS — Z7982 Long term (current) use of aspirin: Secondary | ICD-10-CM | POA: Diagnosis not present

## 2021-10-09 DIAGNOSIS — D351 Benign neoplasm of parathyroid gland: Secondary | ICD-10-CM | POA: Diagnosis not present

## 2021-10-09 LAB — BASIC METABOLIC PANEL
Anion gap: 6 (ref 5–15)
BUN: 26 mg/dL — ABNORMAL HIGH (ref 8–23)
CO2: 22 mmol/L (ref 22–32)
Calcium: 9.4 mg/dL (ref 8.9–10.3)
Chloride: 109 mmol/L (ref 98–111)
Creatinine, Ser: 1.45 mg/dL — ABNORMAL HIGH (ref 0.44–1.00)
GFR, Estimated: 39 mL/min — ABNORMAL LOW (ref >60)
Glucose, Bld: 128 mg/dL — ABNORMAL HIGH (ref 70–99)
Potassium: 4.8 mmol/L (ref 3.5–5.1)
Sodium: 137 mmol/L (ref 135–145)

## 2021-10-09 LAB — CBC
HCT: 32.5 % — ABNORMAL LOW (ref 36.0–46.0)
Hemoglobin: 10.6 g/dL — ABNORMAL LOW (ref 12.0–15.0)
MCH: 30.4 pg (ref 26.0–34.0)
MCHC: 32.6 g/dL (ref 30.0–36.0)
MCV: 93.1 fL (ref 80.0–100.0)
Platelets: 254 10*3/uL (ref 150–400)
RBC: 3.49 MIL/uL — ABNORMAL LOW (ref 3.87–5.11)
RDW: 13 % (ref 11.5–15.5)
WBC: 11.8 10*3/uL — ABNORMAL HIGH (ref 4.0–10.5)
nRBC: 0 % (ref 0.0–0.2)

## 2021-10-09 LAB — CALCIUM, IONIZED: Calcium, Ionized, Serum: 5.3 mg/dL (ref 4.5–5.6)

## 2021-10-09 LAB — SURGICAL PATHOLOGY

## 2021-10-09 NOTE — Discharge Summary (Signed)
Physician Discharge Summary  [name] NUU:[id number] DOB: 01/04/1951 DOA: 10/08/2021  PCP: Cassandria Anger, MD  Admit date: 10/08/2021 Discharge date: 10/09/2021  Recommendations for Outpatient Follow-up:   (include homehealth, outpatient follow-up instructions, specific recommendations for PCP to follow-up on, etc.)   Follow-up Information     Jeannett Dekoning, Arta Bruce, MD Follow up in 3 week(s).   Specialty: General Surgery Contact information: [place] [address] [phone]                Discharge Diagnoses:  Principal Problem:   Parathyroid adenoma   Surgical Procedure: right inf parathyroidectomy  Discharge Condition: Good Disposition: Home  Diet recommendation: reg diet   Hospital Course:  71 yo female underwent parathyroidectomy for primary hyperparathyroidism. Post operatively she did well. Pain was controlled. She tolerated a diet and was discharged home POD 1.  Discharge Instructions  Discharge Instructions     Call MD for:  difficulty breathing, headache or visual disturbances   Complete by: As directed    Call MD for:  hives   Complete by: As directed    Call MD for:  persistant nausea and vomiting   Complete by: As directed    Call MD for:  redness, tenderness, or signs of infection (pain, swelling, redness, odor or green/yellow discharge around incision site)   Complete by: As directed    Call MD for:  severe uncontrolled pain   Complete by: As directed    Call MD for:  temperature >100.4   Complete by: As directed    Diet - low sodium heart healthy   Complete by: As directed    Discharge wound care:   Complete by: As directed    Ok to shower tomorrow. Glue will likely peel off in 1-3 weeks. No bandage required   Driving Restrictions   Complete by: As directed    No driving while on narcotics   Increase activity slowly   Complete by: As directed    Lifting restrictions   Complete by: As  directed    No lifting greater than 20 pounds for 3 weeks      Allergies as of 10/09/2021       Reactions   Buprenorphine Hcl Other (See Comments)   Severe headache, confusion   Morphine And Related Other (See Comments)   Severe headache   Amlodipine Swelling   Cephalexin Nausea And Vomiting, Nausea Only   Other Other (See Comments)   Trees & Grass = allergic symptoms   Sumatriptan Other (See Comments)   Codeine Other (See Comments)   Disorientation   Dust Mite Extract Cough   Molds & Smuts Cough        Medication List     TAKE these medications    acetaminophen 325 MG tablet Commonly known as: TYLENOL Take 2 tablets (650 mg total) by mouth every 6 (six) hours as needed for moderate pain.   ALPRAZolam 0.5 MG tablet Commonly known as: XANAX Take 1 tablet (0.5 mg total) by mouth 2 (two) times daily as needed for anxiety.   aspirin EC 81 MG tablet Take 1 tablet (81 mg total) by mouth daily.   busPIRone 30 MG tablet Commonly known as: BUSPAR Take 1 tablet (30 mg total) by mouth 2 (two) times daily.   colesevelam 625 MG tablet Commonly known as: WELCHOL Take 2 tablets (1,250 mg total) by mouth 2 (two) times daily with a meal.   D-Mannose 500 MG Caps  Take 500 mg by mouth 2 (two) times daily. WITH CRANBERRY & DANDELION EXTRACT   D3 5000 125 MCG (5000 UT) capsule Generic drug: Cholecalciferol Take 5,000 Units by mouth daily.   diphenoxylate-atropine 2.5-0.025 MG tablet Commonly known as: Lomotil Take 1 tablet by mouth 4 (four) times daily as needed for diarrhea or loose stools.   esomeprazole 40 MG capsule Commonly known as: NEXIUM TAKE 1 CAPSULE BY MOUTH DAILY IN THE MORNING   FLUoxetine 40 MG capsule Commonly known as: PROZAC Take 1 capsule (40 mg total) by mouth every morning.   furosemide 20 MG tablet Commonly known as: LASIX Take 1-2 tablets (20-40 mg total) by mouth daily as needed for edema. TAKE 1 TO 2 TABLETS BY MOUTH  DAILY AS DIRECTED    ipratropium-albuterol 0.5-2.5 (3) MG/3ML Soln Commonly known as: DUONEB Take 3 mLs by nebulization every 6 (six) hours as needed (Asthma).   iron polysaccharides 150 MG capsule Commonly known as: Ferrex 150 Take 1 capsule (150 mg total) by mouth 2 (two) times daily.   levothyroxine 100 MCG tablet Commonly known as: SYNTHROID Take 1 tablet (100 mcg total) by mouth daily before breakfast.   lisinopril 20 MG tablet Commonly known as: ZESTRIL Take 0.5 tablets (10 mg total) by mouth daily.   loperamide 2 MG tablet Commonly known as: IMODIUM A-D Take 2 mg by mouth 4 (four) times daily as needed for diarrhea or loose stools.   METAMUCIL FIBER PO Take 1 capsule by mouth in the morning and at bedtime.   methylphenidate 5 MG tablet Commonly known as: RITALIN Take 1 tablet (5 mg total) by mouth 2 (two) times daily with breakfast and lunch.   mirtazapine 30 MG tablet Commonly known as: REMERON Take 1 tablet (30 mg total) by mouth at bedtime.   omega-3 acid ethyl esters 1 g capsule Commonly known as: LOVAZA Take 2 capsules (2 g total) by mouth 2 (two) times daily.   OVER THE COUNTER MEDICATION Take 1 capsule by mouth daily. 7 MUSHROOM BLEND   progesterone 100 MG capsule Commonly known as: PROMETRIUM Take 100 mg by mouth daily.   sodium chloride 0.65 % Soln nasal spray Commonly known as: OCEAN Place 1 spray into both nostrils as needed for congestion.   SYSTANE OP Place 1 drop into both eyes 2 (two) times daily as needed (dry eyes).   traMADol 50 MG tablet Commonly known as: ULTRAM Take 50 mg by mouth every 6 (six) hours as needed for moderate pain.   vitamin B-12 1000 MCG tablet Commonly known as: CYANOCOBALAMIN Take 1,000 mcg by mouth daily.   vitamin C 500 MG tablet Commonly known as: ASCORBIC ACID Take 500 mg by mouth daily.               Discharge Care Instructions  (From admission, onward)           Start     Ordered   10/09/21 0000  Discharge  wound care:       Comments: Ok to shower tomorrow. Glue will likely peel off in 1-3 weeks. No bandage required   10/09/21 4132            Follow-up Information     Libni Fusaro, Arta Bruce, MD Follow up in 3 week(s).   Specialty: General Surgery Contact information: [place] [address] [phone]                  The results of significant  diagnostics from this hospitalization (including imaging, microbiology, ancillary and laboratory) are listed below for reference.    Significant Diagnostic Studies: No results found.  Labs: Basic Metabolic Panel: Recent Labs  Lab 10/08/21 1624 10/09/21 0451  NA  --  137  K  --  4.8  CL  --  109  CO2  --  22  GLUCOSE  --  128*  BUN  --  26*  CREATININE 1.51* 1.45*  CALCIUM  --  9.4   Liver Function Tests: No results for input(s): AST, ALT, ALKPHOS, BILITOT, PROT, ALBUMIN in the last 168 hours.  CBC: Recent Labs  Lab 10/08/21 1624 10/09/21 0451  WBC 8.3 11.8*  HGB 11.7* 10.6*  HCT 36.5 32.5*  MCV 93.8 93.1  PLT 276 254    CBG: No results for input(s): GLUCAP in the last 168 hours.  Principal Problem:   Parathyroid adenoma   Time coordinating discharge: 15 min

## 2021-10-09 NOTE — Progress Notes (Signed)
Transition of Care [facility]) Screening Note  Patient Details  Name: TONJUA ROSSETTI Date of Birth: November 24, 1950  Transition of Care [facility]) CM/SW Contact:    Sherie Don, LCSW Phone Number: 10/09/2021, 10:08 AM  Transition of Care Department [facility]) has reviewed patient and no TOC needs have been identified at this time. We will continue to monitor patient advancement through interdisciplinary progression rounds. If new patient transition needs arise, please place a TOC consult.

## 2021-10-14 ENCOUNTER — Ambulatory Visit (INDEPENDENT_AMBULATORY_CARE_PROVIDER_SITE_OTHER): Payer: Medicare Other | Admitting: Psychology

## 2021-10-14 DIAGNOSIS — F4323 Adjustment disorder with mixed anxiety and depressed mood: Secondary | ICD-10-CM | POA: Diagnosis not present

## 2021-10-14 NOTE — Progress Notes (Signed)
[name] is a 71 y.o. 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 (Status: maintained) -- No Description Entered  Diminished interest in or enjoyment of activities. (Status: maintained) -- No Description Entered  Lack of energy. (Status: maintained) -- No Description Entered  Sad affect, social withdrawal, anxiety, loss of interest in activities, and low energy. (Status: maintained) -- No Description Entered  Medication Status compliance  Safety unspecified If Suicidal or Homicidal State Action Taken: unspecified  Current Risk:  Medications Buspar (Dosage: 30mg  2X/day)  New Medication (Dosage: 250mg  2x/day)  New Medication (Dosage: 250mg  2x/day)  Prozac (Dosage: 40mg  daily)  Remeron (Dosage: 30mg  nightly)  xanax (Dosage: .5mg  2-3X/day)  Objectives Related Problem: Recognize, accept, and cope with feelings of depression. Description: Identify and replace thoughts and beliefs that support depression. Target Date: 2022-04-28 Frequency: Daily Modality: individual Progress: 70%  Related Problem: Recognize, accept, and cope with feelings of depression. Description: Verbalize an understanding of healthy and unhealthy emotions with the intent of increasing the use of healthy emotions to guide actions. Target Date: 2022-04-28 Frequency: Daily Modality: individual Progress: 70%  Related Problem: Recognize, accept, and cope with feelings of depression. Description: Increasingly verbalize hopeful and positive statements regarding self, others, and the future. Target Date: 2022-04-28 Frequency: Daily Modality: individual Progress:  70%  Related Problem: Reduce fear, anxiety, and worry associated with the medical condition. Description: Share with significant others efforts to adapt successfully to the medical condition/diagnosis. Target Date: 2022-04-28 Frequency: Daily Modality: individual Progress: 80%  Related Problem: Reduce fear, anxiety, and worry associated with the medical condition. Description: Engage in social, productive, and recreational activities that are possible in spite of medical condition. Target Date: 2022-04-28 Frequency: Daily Modality: individual Progress: 40%  Related Problem: Reduce fear, anxiety, and worry associated with the medical condition. Description: Identify the coping skills and sources of emotional support that have been beneficial in the past. Target Date: 2022-04-28 Frequency: Daily Modality: individual Progress: 50%  Client Response full compliance  Service Location Location, [name] [place], [place] [address]  Service Code cpt [phone]  Related past to present  Facilitate problem solving  Identify/label emotions  Validate/empathize  Self care activities  Lifestyle change (exercise, nutrition)  Assess/facilitate readiness to change  Relaxation training  Rationally challenge thoughts or beliefs/cognitive restructuring  Comments  Dx: F43.21  Meds: Prozac, [name], [name], 2021 (completed). She  is also wanting to utilize counseling to adjust and accept her latest diagnosis of  cancer. She wants assistance to better manage this news and remain positive throughout treatment. Goal date is 12-22. Patient has made progress, but her medical condition is variable and she requests additional treatment to manage moods throughout her medical ordeal. In addition, she is trying to manage feelings associated with her older son's mental health struggles. Revised goal date is 12-23.  Patient agrees to have a video session due to the [facility] Virus. She is at home and I am in home office.   [name] had her surgery and says that her anxiety escalated while in the hospital. She felt manic post surgery for about 24 hours. The surgery itself went well, as did the recovery. The hope is that this will resolve some of the chronic illness she has been suffering. We talked about her being realistic regarding her cancer, but is optimistic that her quality of life (for now) will be improved. She has lost confidence in her nephrologist and I encouraged her to make a change as she needs to feel comfortable with her providers. She agrees and will make a change. Talked about her concerns/fears about her son [name]. She sometimes blames herself, but realizes she is not responsible for his behavior.                                            [name] Time: 3:10p-4:00 50 minutes

## 2021-10-19 ENCOUNTER — Encounter: Payer: Self-pay | Admitting: Gynecologic Oncology

## 2021-10-19 ENCOUNTER — Ambulatory Visit (INDEPENDENT_AMBULATORY_CARE_PROVIDER_SITE_OTHER): Payer: Medicare Other | Admitting: Psychology

## 2021-10-19 DIAGNOSIS — F4323 Adjustment disorder with mixed anxiety and depressed mood: Secondary | ICD-10-CM | POA: Diagnosis not present

## 2021-10-19 NOTE — Progress Notes (Signed)
[name] is a 71 y.o. 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 (Status: maintained) -- No Description Entered  Diminished interest in or enjoyment of activities. (Status: maintained) -- No Description Entered  Lack of energy. (Status: maintained) -- No Description Entered  Sad affect, social withdrawal, anxiety, loss of interest in activities, and low energy. (Status: maintained) -- No Description Entered  Medication Status compliance  Safety unspecified If Suicidal or Homicidal State Action Taken: unspecified  Current Risk:  Medications Buspar (Dosage: 30mg  2X/day)  New Medication (Dosage: 250mg  2x/day)  New Medication (Dosage: 250mg  2x/day)  Prozac (Dosage: 40mg  daily)  Remeron (Dosage: 30mg  nightly)  xanax (Dosage: .5mg  2-3X/day)  Objectives Related Problem: Recognize, accept, and cope with feelings of depression. Description: Identify and replace thoughts and beliefs that support depression. Target Date: 2022-04-28 Frequency: Daily Modality: individual Progress: 70%  Related Problem: Recognize, accept, and cope with feelings of depression. Description: Verbalize an understanding of healthy and unhealthy emotions with the intent of increasing the use of healthy emotions to guide actions. Target Date: 2022-04-28 Frequency: Daily Modality: individual Progress: 70%  Related Problem: Recognize, accept, and cope with feelings of depression. Description: Increasingly verbalize hopeful and positive statements regarding self, others, and the future. Target Date: 2022-04-28 Frequency: Daily Modality:  individual Progress: 70%  Related Problem: Reduce fear, anxiety, and worry associated with the medical condition. Description: Share with significant others efforts to adapt successfully to the medical condition/diagnosis. Target Date: 2022-04-28 Frequency: Daily Modality: individual Progress: 80%  Related Problem: Reduce fear, anxiety, and worry associated with the medical condition. Description: Engage in social, productive, and recreational activities that are possible in spite of medical condition. Target Date: 2022-04-28 Frequency: Daily Modality: individual Progress: 40%  Related Problem: Reduce fear, anxiety, and worry associated with the medical condition. Description: Identify the coping skills and sources of emotional support that have been beneficial in the past. Target Date: 2022-04-28 Frequency: Daily Modality: individual Progress: 50%  Client Response full compliance  Service Location Location, [name] [place], [place] [address]  Service Code cpt [phone]  Related past to present  Facilitate problem solving  Identify/label emotions  Validate/empathize  Self care activities  Lifestyle change (exercise, nutrition)  Assess/facilitate readiness to change  Relaxation training  Rationally challenge thoughts or beliefs/cognitive restructuring  Comments  Dx: F43.21  Meds: Prozac, [name], patient is struggling to rebuild her life and adjust to her new circumstances. She suffers a negative self-concept and has little confidence in her judgement. She is seeking to reduce symptoms of anxiety and self-doubt and to re-define satisfaction moving forward. Therapy will focus on cognitive restructuring to redefine her experiences and focus on those  satisfying aspects of her life. Will target this goal completion for April1, 2021 (completed). She is also wanting to utilize counseling to adjust and accept her  latest diagnosis of cancer. She wants assistance to better manage this news and remain positive throughout treatment. Goal date is 12-22. Patient has made progress, but her medical condition is variable and she requests additional treatment to manage moods throughout her medical ordeal. In addition, she is trying to manage feelings associated with her older son's mental health struggles. Revised goal date is 12-23.  Patient agrees to have a video session due to the [facility] Virus. She is at home and I am in home office.   Suraya Vidrine says that she has a week of doctor's appointments ahead. She continues to do better since her surgery. She also reports that she is less anxious than before. Less high blood pressure and fewer headaches has contributed to less anxiety and distress. She is very relieved that her son passed his nursing exam and is seeking a job. He has several choices. In contrast, very worried about son [name], who she has not heard from (restraining order) and doesn't have any information regarding how he is doing. She has a good understanding of the situation and appreciates the need to maintain the restraining order.                                                 Armoni Kludt LEWIS Time: 4:10p-5:00 50 minutes

## 2021-10-20 DIAGNOSIS — D351 Benign neoplasm of parathyroid gland: Secondary | ICD-10-CM | POA: Diagnosis not present

## 2021-10-20 DIAGNOSIS — N1832 Chronic kidney disease, stage 3b: Secondary | ICD-10-CM | POA: Diagnosis not present

## 2021-10-20 DIAGNOSIS — I129 Hypertensive chronic kidney disease with stage 1 through stage 4 chronic kidney disease, or unspecified chronic kidney disease: Secondary | ICD-10-CM | POA: Diagnosis not present

## 2021-10-20 DIAGNOSIS — N200 Calculus of kidney: Secondary | ICD-10-CM | POA: Diagnosis not present

## 2021-10-21 ENCOUNTER — Institutional Professional Consult (permissible substitution): Payer: Medicare Other | Admitting: Diagnostic Neuroimaging

## 2021-10-21 ENCOUNTER — Encounter: Payer: Self-pay | Admitting: Diagnostic Neuroimaging

## 2021-10-23 ENCOUNTER — Inpatient Hospital Stay: Payer: Medicare Other | Admitting: Gynecologic Oncology

## 2021-10-23 ENCOUNTER — Telehealth: Payer: Self-pay | Admitting: *Deleted

## 2021-10-23 DIAGNOSIS — C55 Malignant neoplasm of uterus, part unspecified: Secondary | ICD-10-CM

## 2021-10-23 NOTE — Telephone Encounter (Signed)
Patient called and rescheduled her appt from today to 6/26 due to being sick

## 2021-10-26 ENCOUNTER — Ambulatory Visit: Payer: Medicare Other | Admitting: Internal Medicine

## 2021-10-28 ENCOUNTER — Ambulatory Visit (INDEPENDENT_AMBULATORY_CARE_PROVIDER_SITE_OTHER): Payer: Medicare Other | Admitting: Psychology

## 2021-10-28 DIAGNOSIS — F4323 Adjustment disorder with mixed anxiety and depressed mood: Secondary | ICD-10-CM

## 2021-10-28 NOTE — Progress Notes (Signed)
[name] is a 71 y.o. 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 (Status: maintained) -- No Description Entered  Diminished interest in or enjoyment of activities. (Status: maintained) -- No Description Entered  Lack of energy. (Status: maintained) -- No Description Entered  Sad affect, social withdrawal, anxiety, loss of interest in activities, and low energy. (Status: maintained) -- No Description Entered  Medication Status compliance  Safety unspecified If Suicidal or Homicidal State Action Taken: unspecified  Current Risk:  Medications Buspar (Dosage: 30mg  2X/day)  New Medication (Dosage: 250mg  2x/day)  New Medication (Dosage: 250mg  2x/day)  Prozac (Dosage: 40mg  daily)  Remeron (Dosage: 30mg  nightly)  xanax (Dosage: .5mg  2-3X/day)  Objectives Related Problem: Recognize, accept, and cope with feelings of depression. Description: Identify and replace thoughts and beliefs that support depression. Target Date: 2022-04-28 Frequency: Daily Modality: individual Progress: 70%  Related Problem: Recognize, accept, and cope with feelings of depression. Description: Verbalize an understanding of healthy and unhealthy emotions with the intent of increasing the use of healthy emotions to guide actions. Target Date: 2022-04-28 Frequency: Daily Modality: individual Progress: 70%  Related Problem: Recognize, accept, and cope with feelings of depression. Description: Increasingly verbalize hopeful and positive statements regarding self, others, and the future. Target Date:  2022-04-28 Frequency: Daily Modality: individual Progress: 70%  Related Problem: Reduce fear, anxiety, and worry associated with the medical condition. Description: Share with significant others efforts to adapt successfully to the medical condition/diagnosis. Target Date: 2022-04-28 Frequency: Daily Modality: individual Progress: 80%  Related Problem: Reduce fear, anxiety, and worry associated with the medical condition. Description: Engage in social, productive, and recreational activities that are possible in spite of medical condition. Target Date: 2022-04-28 Frequency: Daily Modality: individual Progress: 40%  Related Problem: Reduce fear, anxiety, and worry associated with the medical condition. Description: Identify the coping skills and sources of emotional support that have been beneficial in the past. Target Date: 2022-04-28 Frequency: Daily Modality: individual Progress: 50%  Client Response full compliance  Service Location Location, [name] [place], [place] [address]  Service Code cpt [phone]  Related past to present  Facilitate problem solving  Identify/label emotions  Validate/empathize  Self care activities  Lifestyle change (exercise, nutrition)  Assess/facilitate readiness to change  Relaxation training  Rationally challenge thoughts or beliefs/cognitive restructuring  Comments  Dx: F43.21  Meds: Prozac, [name], patient is struggling to rebuild her life and adjust to her new circumstances. She suffers a negative self-concept and has little confidence in her judgement. She is seeking to reduce symptoms of anxiety and self-doubt and to re-define satisfaction moving  forward. Therapy will focus on cognitive restructuring to redefine her experiences and focus on those satisfying aspects of her life. Will target this goal completion for April1, 2021 (completed). She is also wanting to utilize  counseling to adjust and accept her latest diagnosis of cancer. She wants assistance to better manage this news and remain positive throughout treatment. Goal date is 12-22. Patient has made progress, but her medical condition is variable and she requests additional treatment to manage moods throughout her medical ordeal. In addition, she is trying to manage feelings associated with her older son's mental health struggles. Revised goal date is 12-23.  Patient agrees to have a video session due to the [facility] Virus. She is at home and I am in home office.   [name] is frustrated that she is not "back to normal" with regard to her energy. She is still isolated and [facility] She realizes she needs to be more realistic.        Found out that her ex father in law died and she expressed condolences to her ex husband. She thinks of her son [name] every day, but still has not talked with him or heard anything about him. She talked about a plan to get back to a regular routine which includes self-care.                                      [name] Time: 3:15p-4:00 45 minutes

## 2021-10-29 ENCOUNTER — Telehealth: Payer: Medicare Other

## 2021-11-02 ENCOUNTER — Ambulatory Visit (INDEPENDENT_AMBULATORY_CARE_PROVIDER_SITE_OTHER): Payer: Medicare Other | Admitting: Psychology

## 2021-11-02 DIAGNOSIS — F4323 Adjustment disorder with mixed anxiety and depressed mood: Secondary | ICD-10-CM

## 2021-11-02 NOTE — Progress Notes (Signed)
[name] is a 71 y.o. 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 (Status: maintained) -- No Description Entered  Diminished interest in or enjoyment of activities. (Status: maintained) -- No Description Entered  Lack of energy. (Status: maintained) -- No Description Entered  Sad affect, social withdrawal, anxiety, loss of interest in activities, and low energy. (Status: maintained) -- No Description Entered  Medication Status compliance  Safety unspecified If Suicidal or Homicidal State Action Taken: unspecified  Current Risk:  Medications Buspar (Dosage: 30mg  2X/day)  New Medication (Dosage: 250mg  2x/day)  New Medication (Dosage: 250mg  2x/day)  Prozac (Dosage: 40mg  daily)  Remeron (Dosage: 30mg  nightly)  xanax (Dosage: .5mg  2-3X/day)  Objectives Related Problem: Recognize, accept, and cope with feelings of depression. Description: Identify and replace thoughts and beliefs that support depression. Target Date: 2022-04-28 Frequency: Daily Modality: individual Progress: 70%  Related Problem: Recognize, accept, and cope with feelings of depression. Description: Verbalize an understanding of healthy and unhealthy emotions with the intent of increasing the use of healthy emotions to guide actions. Target Date: 2022-04-28 Frequency: Daily Modality: individual Progress: 70%  Related Problem: Recognize, accept, and cope with feelings of depression. Description: Increasingly verbalize hopeful and positive statements regarding self, others, and the  future. Target Date: 2022-04-28 Frequency: Daily Modality: individual Progress: 70%  Related Problem: Reduce fear, anxiety, and worry associated with the medical condition. Description: Share with significant others efforts to adapt successfully to the medical condition/diagnosis. Target Date: 2022-04-28 Frequency: Daily Modality: individual Progress: 80%  Related Problem: Reduce fear, anxiety, and worry associated with the medical condition. Description: Engage in social, productive, and recreational activities that are possible in spite of medical condition. Target Date: 2022-04-28 Frequency: Daily Modality: individual Progress: 40%  Related Problem: Reduce fear, anxiety, and worry associated with the medical condition. Description: Identify the coping skills and sources of emotional support that have been beneficial in the past. Target Date: 2022-04-28 Frequency: Daily Modality: individual Progress: 50%  Client Response full compliance  Service Location Location, [name] [place], [place] [address]  Service Code cpt [phone]  Related past to present  Facilitate problem solving  Identify/label emotions  Validate/empathize  Self care activities  Lifestyle change (exercise, nutrition)  Assess/facilitate readiness to change  Relaxation training  Rationally challenge thoughts or beliefs/cognitive restructuring  Comments  Dx: F43.21  Meds: Prozac, [name], patient is struggling to rebuild her life and adjust to her new circumstances. She suffers a negative self-concept and has little confidence in her judgement.  She is seeking to reduce symptoms of anxiety and self-doubt and to re-define satisfaction moving forward. Therapy will focus on cognitive restructuring to redefine her experiences and focus on those satisfying aspects of her life. Will target this goal completion for April1, 2021 (completed). She is  also wanting to utilize counseling to adjust and accept her latest diagnosis of cancer. She wants assistance to better manage this news and remain positive throughout treatment. Goal date is 12-22. Patient has made progress, but her medical condition is variable and she requests additional treatment to manage moods throughout her medical ordeal. In addition, she is trying to manage feelings associated with her older son's mental health struggles. Revised goal date is 12-23.  Patient agrees to have a video session due to the [facility] Virus. She is at home and I am in home office.   [name] says she is feeling down, "but not really depressed". She is struggling with the slow pace of progress in her medical care. She is also distressed about the lack of contact with son [name]. She fears for his future and for the future of their relationship. She talked about her recognition now that she has suffered past trauma that has never been resolved. She has had multiple situations on top of each that has compounded her trauma. Suggested that she start to journal some of her memories and feelings for [place] to discuss.                                        [name] Time: 4:10p-5:00 45 minutes

## 2021-11-09 ENCOUNTER — Inpatient Hospital Stay: Payer: Medicare Other | Attending: Gynecologic Oncology | Admitting: Gynecologic Oncology

## 2021-11-09 DIAGNOSIS — Z9221 Personal history of antineoplastic chemotherapy: Secondary | ICD-10-CM | POA: Insufficient documentation

## 2021-11-09 DIAGNOSIS — Z90710 Acquired absence of both cervix and uterus: Secondary | ICD-10-CM | POA: Insufficient documentation

## 2021-11-09 DIAGNOSIS — Z90722 Acquired absence of ovaries, bilateral: Secondary | ICD-10-CM | POA: Insufficient documentation

## 2021-11-09 DIAGNOSIS — C55 Malignant neoplasm of uterus, part unspecified: Secondary | ICD-10-CM

## 2021-11-09 DIAGNOSIS — Z923 Personal history of irradiation: Secondary | ICD-10-CM | POA: Insufficient documentation

## 2021-11-09 DIAGNOSIS — Z8542 Personal history of malignant neoplasm of other parts of uterus: Secondary | ICD-10-CM | POA: Insufficient documentation

## 2021-11-10 ENCOUNTER — Encounter: Payer: Self-pay | Admitting: Gynecologic Oncology

## 2021-11-11 ENCOUNTER — Ambulatory Visit (INDEPENDENT_AMBULATORY_CARE_PROVIDER_SITE_OTHER): Payer: Medicare Other | Admitting: Psychology

## 2021-11-11 DIAGNOSIS — F4323 Adjustment disorder with mixed anxiety and depressed mood: Secondary | ICD-10-CM | POA: Diagnosis not present

## 2021-11-11 NOTE — Progress Notes (Signed)
[name] is a 71 y.o. 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 (Status: maintained) -- No Description Entered  Diminished interest in or enjoyment of activities. (Status: maintained) -- No Description Entered  Lack of energy. (Status: maintained) -- No Description Entered  Sad affect, social withdrawal, anxiety, loss of interest in activities, and low energy. (Status: maintained) -- No Description Entered  Medication Status compliance  Safety unspecified If Suicidal or Homicidal State Action Taken: unspecified  Current Risk:  Medications Buspar (Dosage: 30mg  2X/day)  New Medication (Dosage: 250mg  2x/day)  New Medication (Dosage: 250mg  2x/day)  Prozac (Dosage: 40mg  daily)  Remeron (Dosage: 30mg  nightly)  xanax (Dosage: .5mg  2-3X/day)  Objectives Related Problem: Recognize, accept, and cope with feelings of depression. Description: Identify and replace thoughts and beliefs that support depression. Target Date: 2022-04-28 Frequency: Daily Modality: individual Progress: 70%  Related Problem: Recognize, accept, and cope with feelings of depression. Description: Verbalize an understanding of healthy and unhealthy emotions with the intent of increasing the use of healthy emotions to guide actions. Target Date: 2022-04-28 Frequency: Daily Modality: individual Progress: 70%  Related Problem: Recognize, accept, and cope with feelings of depression. Description: Increasingly verbalize hopeful and positive statements regarding  self, others, and the future. Target Date: 2022-04-28 Frequency: Daily Modality: individual Progress: 70%  Related Problem: Reduce fear, anxiety, and worry associated with the medical condition. Description: Share with significant others efforts to adapt successfully to the medical condition/diagnosis. Target Date: 2022-04-28 Frequency: Daily Modality: individual Progress: 80%  Related Problem: Reduce fear, anxiety, and worry associated with the medical condition. Description: Engage in social, productive, and recreational activities that are possible in spite of medical condition. Target Date: 2022-04-28 Frequency: Daily Modality: individual Progress: 40%  Related Problem: Reduce fear, anxiety, and worry associated with the medical condition. Description: Identify the coping skills and sources of emotional support that have been beneficial in the past. Target Date: 2022-04-28 Frequency: Daily Modality: individual Progress: 50%  Client Response full compliance  Service Location Location, [name] [place], [place] [address]  Service Code cpt [phone]  Related past to present  Facilitate problem solving  Identify/label emotions  Validate/empathize  Self care activities  Lifestyle change (exercise, nutrition)  Assess/facilitate readiness to change  Relaxation training  Rationally challenge thoughts or beliefs/cognitive restructuring  Comments  Dx: F43.21  Meds: Prozac, [name], patient is struggling to rebuild her life and adjust to  her new circumstances. She suffers a negative self-concept and has little confidence in her judgement. She is seeking to reduce symptoms of anxiety and self-doubt and to re-define satisfaction moving forward. Therapy will focus on cognitive restructuring to redefine her experiences and focus on those satisfying aspects of her life. Will target this goal completion for April1, 2021  (completed). She is also wanting to utilize counseling to adjust and accept her latest diagnosis of cancer. She wants assistance to better manage this news and remain positive throughout treatment. Goal date is 12-22. Patient has made progress, but her medical condition is variable and she requests additional treatment to manage moods throughout her medical ordeal. In addition, she is trying to manage feelings associated with her older son's mental health struggles. Revised goal date is 12-23.  Patient agrees to have a video session due to the [facility] Virus. She is at home and I am in home office.   [name] says she is "gradually feeling better". She report tht she is working on remaining calm. She is reading and staying engaged with learning. This has been helpful to keep her feeling more positive and distracted from the more negative aspects of her life. She is getting out more and trying to push the limits. Has more motivation than before.                                           Magdaline Zollars LEWIS Time: 3:15p-4:00 45 minutes

## 2021-11-13 ENCOUNTER — Other Ambulatory Visit: Payer: Self-pay

## 2021-11-13 ENCOUNTER — Other Ambulatory Visit: Payer: Self-pay | Admitting: *Deleted

## 2021-11-13 ENCOUNTER — Inpatient Hospital Stay (HOSPITAL_BASED_OUTPATIENT_CLINIC_OR_DEPARTMENT_OTHER): Payer: Medicare Other | Admitting: Gynecologic Oncology

## 2021-11-13 VITALS — BP 131/94 | HR 95 | Temp 98.0°F | Resp 18 | Wt 159.0 lb

## 2021-11-13 DIAGNOSIS — Z90710 Acquired absence of both cervix and uterus: Secondary | ICD-10-CM | POA: Diagnosis not present

## 2021-11-13 DIAGNOSIS — Z90722 Acquired absence of ovaries, bilateral: Secondary | ICD-10-CM | POA: Diagnosis not present

## 2021-11-13 DIAGNOSIS — C55 Malignant neoplasm of uterus, part unspecified: Secondary | ICD-10-CM

## 2021-11-13 DIAGNOSIS — Z9221 Personal history of antineoplastic chemotherapy: Secondary | ICD-10-CM | POA: Diagnosis not present

## 2021-11-13 DIAGNOSIS — Z8542 Personal history of malignant neoplasm of other parts of uterus: Secondary | ICD-10-CM | POA: Diagnosis present

## 2021-11-13 DIAGNOSIS — Z923 Personal history of irradiation: Secondary | ICD-10-CM | POA: Diagnosis not present

## 2021-11-13 MED ORDER — ESOMEPRAZOLE MAGNESIUM 40 MG PO CPDR: 40.0000 mg | DELAYED_RELEASE_CAPSULE | Freq: Every morning | ORAL | 1 refills | Status: DC

## 2021-11-13 NOTE — Patient Instructions (Addendum)
It was good to see you today.  I do not see or feel any evidence of cancer recurrence on your exam.  I will see you back in 3 months.  You will be due for imaging around that time, which I have ordered today.  As always, please let me know if you develop any new signs and symptoms concerning for cancer recurrence before your next visit.

## 2021-11-13 NOTE — Progress Notes (Signed)
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 or masses, mouth sores, ringing in ears or voice changes. Denies cough or wheezing.   Denies chest pain or palpitations. Denies leg swelling. Denies abdominal distention, pain, blood in stools, constipation, diarrhea, nausea, vomiting, or early satiety. Denies pain with intercourse, dysuria, frequency, hematuria or incontinence. Denies hot flashes, pelvic pain, vaginal bleeding or vaginal discharge.   Denies joint pain, back pain or muscle pain/cramps. Denies itching, rash, or wounds. Denies dizziness, numbness or seizures. Denies swollen lymph nodes or glands, denies easy bruising or bleeding. Denies confusion, or decreased concentration.  Physical Exam: BP (!) 131/94 (BP Location: Right Arm, Patient Position: Sitting)   Pulse 95   Temp 98 F (36.7 C) (Tympanic)   Resp 18   Wt 159 lb (72.1 kg)   SpO2 98%   BMI 29.08 kg/m  General: Alert, oriented, no acute distress. HEENT: Normocephalic, atraumatic, sclera anicteric. Chest: Clear to auscultation bilaterally.  No wheezes or rhonchi. Cardiovascular: Regular rate and rhythm,  no murmurs. Abdomen: soft, nontender.  Normoactive bowel sounds.  No masses or hepatosplenomegaly appreciated.  Well-healed incisions. Extremities: Grossly normal range of motion.  Warm, well perfused.  No edema bilaterally. Skin: No rashes or lesions noted. Lymphatics: No cervical, supraclavicular, or inguinal adenopathy. GU: Normal appearing external genitalia without erythema, excoriation, or lesions.  Speculum exam reveals mildly atrophic vaginal mucosa, no bleeding or discharge, no masses.  Bimanual exam reveals cuff smooth, no nodularity or masses.  Rectovaginal exam confirms these findings.  Laboratory & Radiologic Studies: CT C/A/P 07/2021: IMPRESSION: 1. No acute process or evidence of metastatic disease in the chest, abdomen, or pelvis. 2. Right apical/upper lobe wedge resections, without locally recurrence. 3. Left nephrolithiasis. 4. Trace air within the urinary bladder, possibly iatrogenic. Correlate with instrumentation. 5. Tiny hiatal hernia. 6.  Aortic Atherosclerosis (ICD10-I70.0).  Assessment & Plan: [name] is a 71 y.o. woman with history of recurrent LMS (most recent oligometastatic recurrence resected with negative margins in 07/2020) who presents for follow-up.   Patient is NED on exam today.   Is doing much better from a symptom standpoint since being diagnosed with primary hyperparathyroidism and undergoing surgery last month.   Patient discontinued progesterone given concerns about possible malignancy on hormone replacement.  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   I will plan to see her for follow-up in 3 months.  We will get repeat imaging at that time as that will be about 6 months from her last CT.  I have asked her to call with any new or concerning symptoms before her next visit.  24 minutes of total time was spent for this patient encounter, including preparation,  face-to-face counseling with the patient and coordination of care, and documentation of the encounter.  Jeral Pinch, MD  Division of Gynecologic Oncology  Department of Obstetrics and Gynecology  [facility]

## 2021-11-16 ENCOUNTER — Ambulatory Visit: Payer: Medicare Other | Admitting: Psychology

## 2021-11-25 ENCOUNTER — Ambulatory Visit (INDEPENDENT_AMBULATORY_CARE_PROVIDER_SITE_OTHER): Payer: Medicare Other | Admitting: Psychology

## 2021-11-25 DIAGNOSIS — F4323 Adjustment disorder with mixed anxiety and depressed mood: Secondary | ICD-10-CM | POA: Diagnosis not present

## 2021-11-25 NOTE — Progress Notes (Addendum)
DARLISHA KELM is a 71 y.o. 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 (Status: maintained) -- No Description Entered  Diminished interest in or enjoyment of activities. (Status: maintained) -- No Description Entered  Lack of energy. (Status: maintained) -- No Description Entered  Sad affect, social withdrawal, anxiety, loss of interest in activities, and low energy. (Status: maintained) -- No Description Entered  Medication Status compliance  Safety unspecified If Suicidal or Homicidal State Action Taken: unspecified  Current Risk:  Medications Buspar (Dosage: 30mg  2X/day)  New Medication (Dosage: 250mg  2x/day)  New Medication (Dosage: 250mg  2x/day)  Prozac (Dosage: 40mg  daily)  Remeron (Dosage: 30mg  nightly)  xanax (Dosage: .5mg  2-3X/day)  Objectives Related Problem: Recognize, accept, and cope with feelings of depression. Description: Identify and replace thoughts and beliefs that support depression. Target Date: 2022-04-28 Frequency: Daily Modality: individual Progress: 70%  Related Problem: Recognize, accept, and cope with feelings of depression. Description: Verbalize an understanding of healthy and unhealthy emotions with the intent of increasing the use of healthy emotions to guide actions. Target Date: 2022-04-28 Frequency: Daily Modality: individual Progress: 70%  Related Problem: Recognize, accept, and cope with feelings of depression. Description: Increasingly verbalize hopeful and positive statements regarding self, others, and the future. Target Date: 2022-04-28 Frequency: Daily Modality: individual Progress: 70%  Related Problem: Reduce fear, anxiety, and worry associated with the medical condition. Description: Share with significant others efforts to adapt successfully to the medical  condition/diagnosis. Target Date: 2022-04-28 Frequency: Daily Modality: individual Progress: 80%  Related Problem: Reduce fear, anxiety, and worry associated with the medical condition. Description: Engage in social, productive, and recreational activities that are possible in spite of medical condition. Target Date: 2022-04-28 Frequency: Daily Modality: individual Progress: 40%  Related Problem: Reduce fear, anxiety, and worry associated with the medical condition. Description: Identify the coping skills and sources of emotional support that have been beneficial in the past. Target Date: 2022-04-28 Frequency: Daily Modality: individual Progress: 50%  Client Response full compliance  Service Location Location, [name] [place], [place] [address]  Service Code cpt [phone]  Related past to present  Facilitate problem solving  Identify/label emotions  Validate/empathize  Self care activities  Lifestyle change (exercise, nutrition)  Assess/facilitate readiness to change  Relaxation training  Rationally challenge thoughts or beliefs/cognitive restructuring  Comments  Dx: F43.21  Meds: Prozac, [name], [name], 2021 (completed). She is also wanting to utilize counseling to adjust and accept her latest diagnosis of cancer. She wants assistance to better manage this news and remain positive throughout treatment. Goal date is 12-22. Patient has made progress, but her medical condition is variable and she  requests additional treatment to manage moods throughout her medical ordeal. In addition, she is trying to manage feelings associated with her older son's mental health struggles. Revised goal date is 12-23.  Patient agrees to have a video session. She is at home and I am in home office.  Naudia Crosley says she is feeling a little better this week and hopes the trend will continue. She went out to lunch with her son and had a very good time. She is continuing to educate herself about [place] and says she is getting "more adjusted" to the current state of affairs with [name]. She realizes she did the right thing and may never see him again. Is committed to stop her enabling behavior.                                              Ercell Perlman LEWIS Time: 3:15p-4:00 45 minutes

## 2021-11-30 ENCOUNTER — Ambulatory Visit (INDEPENDENT_AMBULATORY_CARE_PROVIDER_SITE_OTHER): Payer: Medicare Other | Admitting: Psychology

## 2021-11-30 DIAGNOSIS — F4323 Adjustment disorder with mixed anxiety and depressed mood: Secondary | ICD-10-CM | POA: Diagnosis not present

## 2021-11-30 NOTE — Progress Notes (Signed)
[name] is a 71 y.o. 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 (Status: maintained) -- No Description Entered  Diminished interest in or enjoyment of activities. (Status: maintained) -- No Description Entered  Lack of energy. (Status: maintained) -- No Description Entered  Sad affect, social withdrawal, anxiety, loss of interest in activities, and low energy. (Status: maintained) -- No Description Entered  Medication Status compliance  Safety unspecified If Suicidal or Homicidal State Action Taken: unspecified  Current Risk:  Medications Buspar (Dosage: 30mg  2X/day)  New Medication (Dosage: 250mg  2x/day)  New Medication (Dosage: 250mg  2x/day)  Prozac (Dosage: 40mg  daily)  Remeron (Dosage: 30mg  nightly)  xanax (Dosage: .5mg  2-3X/day)  Objectives Related Problem: Recognize, accept, and cope with feelings of depression. Description: Identify and replace thoughts and beliefs that support depression. Target Date: 2022-04-28 Frequency: Daily Modality: individual Progress: 70%  Related Problem: Recognize, accept, and cope with feelings of depression. Description: Verbalize an understanding of healthy and unhealthy emotions with the intent of increasing the use of healthy emotions to guide actions. Target Date: 2022-04-28 Frequency: Daily Modality: individual Progress: 70%  Related Problem: Recognize, accept, and cope with feelings of depression. Description: Increasingly verbalize hopeful and positive statements regarding self, others, and the future. Target Date: 2022-04-28 Frequency: Daily Modality: individual Progress: 70%  Related Problem: Reduce fear, anxiety, and worry associated with the medical condition. Description: Share with significant others efforts to adapt successfully to the medical  condition/diagnosis. Target Date: 2022-04-28 Frequency: Daily Modality: individual Progress: 80%  Related Problem: Reduce fear, anxiety, and worry associated with the medical condition. Description: Engage in social, productive, and recreational activities that are possible in spite of medical condition. Target Date: 2022-04-28 Frequency: Daily Modality: individual Progress: 40%  Related Problem: Reduce fear, anxiety, and worry associated with the medical condition. Description: Identify the coping skills and sources of emotional support that have been beneficial in the past. Target Date: 2022-04-28 Frequency: Daily Modality: individual Progress: 50%  Client Response full compliance  Service Location Location, [name] [place], [place] [address]  Service Code cpt [phone]  Related past to present  Facilitate problem solving  Identify/label emotions  Validate/empathize  Self care activities  Lifestyle change (exercise, nutrition)  Assess/facilitate readiness to change  Relaxation training  Rationally challenge thoughts or beliefs/cognitive restructuring  Comments  Dx: F43.21  Meds: Prozac, [name], [name], 2021 (completed). She is also wanting to utilize counseling to adjust and accept her latest diagnosis of cancer. She wants assistance to better manage this news and remain positive throughout treatment. Goal date is 12-22. Patient has made progress, but her medical condition is variable and she  requests additional treatment to manage moods throughout her medical ordeal. In addition, she is trying to manage feelings associated with her older son's mental health struggles. Revised goal date is 12-23.  Patient agrees to have a video session. She is at home and I am in home office.  [name] talked about listening to humorous videos as a way to keep her sprits up. She says this week has mostly been better, but still has some sadness about not having anyu communication with her son [name]. She says "I know we did the right thing, but sometimes it makes me sad". She reviewed the difficult history with [name]. It has been a troubled relationship for many years. She says she "just prays" that she does not get a call that [name] is dead.  She has been visiting parents daily and it has been very helpful to them and to [facility]. Her moods, other than distress about [name], have been even.                                                Deoni Cosey LEWIS Time: 4:15p-5:00 45 minutes

## 2021-12-03 ENCOUNTER — Telehealth: Payer: Self-pay | Admitting: Internal Medicine

## 2021-12-03 NOTE — Telephone Encounter (Signed)
Error

## 2021-12-03 NOTE — Telephone Encounter (Signed)
Pt is requesting a refill on  ALPRAZolam (XANAX) 0.5 MG tablet  lisinopril (ZESTRIL) 20 MG tablet  methylphenidate (RITALIN) 5 MG tablet   Pt would like to know if rx sig can be updated. Pt said she is supposed to take [place] 3 times a day but rx states 2 times a day.     Pharmacy: [id number] - [place], [place] - [place]. Phone:  [phone]  Fax:  [phone]

## 2021-12-04 MED ORDER — LISINOPRIL 20 MG PO TABS: 20.0000 mg | ORAL_TABLET | Freq: Two times a day (BID) | ORAL | 3 refills | Status: DC

## 2021-12-04 MED ORDER — ALPRAZOLAM 0.5 MG PO TABS: 0.5000 mg | ORAL_TABLET | Freq: Three times a day (TID) | ORAL | 3 refills | Status: DC | PRN

## 2021-12-04 MED ORDER — METHYLPHENIDATE HCL 5 MG PO TABS: 5.0000 mg | ORAL_TABLET | Freq: Two times a day (BID) | ORAL | 0 refills | Status: DC

## 2021-12-04 NOTE — Telephone Encounter (Signed)
Okay.  Thanks.

## 2021-12-04 NOTE — Addendum Note (Signed)
Addended by: Cassandria Anger on: 12/04/2021 12:36 AM   Modules accepted: Orders

## 2021-12-08 ENCOUNTER — Ambulatory Visit: Payer: Medicare Other | Admitting: Cardiology

## 2021-12-09 ENCOUNTER — Ambulatory Visit (INDEPENDENT_AMBULATORY_CARE_PROVIDER_SITE_OTHER): Payer: Medicare Other | Admitting: Psychology

## 2021-12-09 ENCOUNTER — Institutional Professional Consult (permissible substitution): Payer: Medicare Other | Admitting: Diagnostic Neuroimaging

## 2021-12-09 DIAGNOSIS — F4323 Adjustment disorder with mixed anxiety and depressed mood: Secondary | ICD-10-CM | POA: Diagnosis not present

## 2021-12-09 NOTE — Progress Notes (Signed)
[name] is a 71 y.o. 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 (Status: maintained) -- No Description Entered  Diminished interest in or enjoyment of activities. (Status: maintained) -- No Description Entered  Lack of energy. (Status: maintained) -- No Description Entered  Sad affect, social withdrawal, anxiety, loss of interest in activities, and low energy. (Status: maintained) -- No Description Entered  Medication Status compliance  Safety unspecified If Suicidal or Homicidal State Action Taken: unspecified  Current Risk:  Medications Buspar (Dosage: 30mg  2X/day)  New Medication (Dosage: 250mg  2x/day)  New Medication (Dosage: 250mg  2x/day)  Prozac (Dosage: 40mg  daily)  Remeron (Dosage: 30mg  nightly)  xanax (Dosage: .5mg  2-3X/day)  Objectives Related Problem: Recognize, accept, and cope with feelings of depression. Description: Identify and replace thoughts and beliefs that support depression. Target Date: 2022-04-28 Frequency: Daily Modality: individual Progress: 70%  Related Problem: Recognize, accept, and cope with feelings of depression. Description: Verbalize an understanding of healthy and unhealthy emotions with the intent of increasing the use of healthy emotions to guide actions. Target Date: 2022-04-28 Frequency: Daily Modality: individual Progress: 70%  Related Problem: Recognize, accept, and cope with feelings of depression. Description:  Increasingly verbalize hopeful and positive statements regarding self, others, and the future. Target Date: 2022-04-28 Frequency: Daily Modality: individual Progress: 70%  Related Problem: Reduce fear, anxiety, and worry associated with the medical condition. Description: Share with significant others efforts to adapt successfully to the medical condition/diagnosis. Target Date: 2022-04-28 Frequency: Daily Modality: individual Progress: 80%  Related Problem: Reduce fear, anxiety, and worry associated with the medical condition. Description: Engage in social, productive, and recreational activities that are possible in spite of medical condition. Target Date: 2022-04-28 Frequency: Daily Modality: individual Progress: 40%  Related Problem: Reduce fear, anxiety, and worry associated with the medical condition. Description: Identify the coping skills and sources of emotional support that have been beneficial in the past. Target Date: 2022-04-28 Frequency: Daily Modality: individual Progress: 50%  Client Response full compliance  Service Location Location, [name] [place],  [address]  Service Code cpt [phone]  Related past to present  Facilitate problem solving  Identify/label emotions  Validate/empathize  Self care activities  Lifestyle change (exercise, nutrition)  Assess/facilitate readiness to change  Relaxation training  Rationally challenge thoughts or beliefs/cognitive restructuring  Comments  Dx: K55.37  Meds: Prozac, [name], patient is struggling to rebuild her life and adjust to her new circumstances. She suffers a negative self-concept and has little confidence in her judgement. She is seeking to reduce symptoms of anxiety and self-doubt and to re-define satisfaction moving forward. Therapy will focus on cognitive restructuring to redefine her experiences and focus on those satisfying  aspects of her life. Will target this goal completion for April1, 2021 (completed). She is also wanting to utilize counseling to adjust and accept her latest diagnosis of cancer. She wants assistance to better manage this news and remain positive throughout treatment. Goal date is 12-22. Patient has made progress, but her medical condition is variable and she requests additional treatment to manage moods throughout her medical ordeal. In addition, she is trying to manage feelings associated with her older son's mental health struggles. Revised goal date is 12-23.  Patient agrees to have a video session. She is at home and I am in home office.   Barri Neidlinger has been working with her neighbors who are helping her with her landscaping. She is very excited and energized to have this taken care of. She talked about feeling grateful for all of the help she gets from family and friends. Her health has been more stable and it helps her feel more encouraged. We talked about efforts to sustain this positive mindset.                                                  Hjalmer Iovino LEWIS Time: 3:15p-4:00 45 minutes

## 2021-12-14 ENCOUNTER — Ambulatory Visit (INDEPENDENT_AMBULATORY_CARE_PROVIDER_SITE_OTHER): Payer: Medicare Other | Admitting: Psychology

## 2021-12-14 DIAGNOSIS — F4323 Adjustment disorder with mixed anxiety and depressed mood: Secondary | ICD-10-CM | POA: Diagnosis not present

## 2021-12-14 NOTE — Progress Notes (Signed)
[name] is a 71 y.o. female patient  Date: 12/14/2021  Treatment Plan Diagnosis F43.21 (Adjustment Disorder, With depressed mood) [n/a]  Symptoms A diagnosis of an acute, serious illness that is life-threatening. (Status: maintained) -- No Description Entered  Diminished interest in or enjoyment of activities. (Status: maintained) -- No Description Entered  Lack of energy. (Status: maintained) -- No Description Entered  Sad affect, social withdrawal, anxiety, loss of interest in activities, and low energy. (Status: maintained) -- No Description Entered  Medication Status compliance  Safety unspecified If Suicidal or Homicidal State Action Taken: unspecified  Current Risk:  Medications Buspar (Dosage: 30mg  2X/day)  New Medication (Dosage: 250mg  2x/day)  New Medication (Dosage: 250mg  2x/day)  Prozac (Dosage: 40mg  daily)  Remeron (Dosage: 30mg  nightly)  xanax (Dosage: .5mg  2-3X/day)  Objectives Related Problem: Recognize, accept, and cope with feelings of depression. Description: Identify and replace thoughts and beliefs that support depression. Target Date: 2022-04-28 Frequency: Daily Modality: individual Progress: 70%  Related Problem: Recognize, accept, and cope with feelings of depression. Description: Verbalize an understanding of healthy and unhealthy emotions with the intent of increasing the use of healthy emotions to guide actions. Target Date: 2022-04-28 Frequency: Daily Modality: individual Progress: 70%  Related Problem: Recognize, accept, and cope with feelings of  depression. Description: Increasingly verbalize hopeful and positive statements regarding self, others, and the future. Target Date: 2022-04-28 Frequency: Daily Modality: individual Progress: 70%  Related Problem: Reduce fear, anxiety, and worry associated with the medical condition. Description: Share with significant others efforts to adapt successfully to the medical condition/diagnosis. Target Date: 2022-04-28 Frequency: Daily Modality: individual Progress: 80%  Related Problem: Reduce fear, anxiety, and worry associated with the medical condition. Description: Engage in social, productive, and recreational activities that are possible in spite of medical condition. Target Date: 2022-04-28 Frequency: Daily Modality: individual Progress: 40%  Related Problem: Reduce fear, anxiety, and worry associated with the medical condition. Description: Identify the coping skills and sources of emotional support that have been beneficial in the past. Target Date: 2022-04-28 Frequency: Daily Modality: individual Progress: 50%  Client Response full compliance  Service Location Location, [name] [place], [place] [address]  Service Code cpt [phone]  Related past to present  Facilitate problem solving  Identify/label emotions  Validate/empathize  Self care activities  Lifestyle change (exercise, nutrition)  Assess/facilitate readiness to change  Relaxation training  Rationally challenge thoughts or beliefs/cognitive restructuring  Comments  Dx: F43.21  Meds: Prozac, [name], patient is struggling to rebuild her life and adjust to her new circumstances. She suffers a negative self-concept and has little confidence in her judgement. She is seeking to reduce symptoms of anxiety and self-doubt and to re-define satisfaction moving forward. Therapy will focus on cognitive restructuring to redefine her experiences and focus  on those satisfying aspects of her life. Will target this goal completion for April1, 2021 (completed). She is also wanting to utilize counseling to adjust and accept her latest diagnosis of cancer. She wants assistance to better manage this news and remain positive throughout treatment. Goal date is 12-22. Patient has made progress, but her medical condition is variable and she requests additional treatment to manage moods throughout her medical ordeal. In addition, she is trying to manage feelings associated with her older son's mental health struggles. Revised goal date is 12-23.  Patient agrees to have a video session. She is at home and I am in home office.   [name] is thrilled that her neighbors took care of fixing up her yard. Feeling lots of gratitude. She is also very happy that her son got a good job with [employer] and has already started. He hopes to go to school after 2 years to become a Immunologist. She talked about feeling bad about Dr. Inocente Salles in that she has not been able to see him and Joanie. Talked about putting all in perspective and not feeling blamed.                                                       [name] Time: 4:15p-5:00 45 minutes

## 2021-12-15 ENCOUNTER — Encounter: Payer: Self-pay | Admitting: Internal Medicine

## 2021-12-15 ENCOUNTER — Ambulatory Visit (INDEPENDENT_AMBULATORY_CARE_PROVIDER_SITE_OTHER): Payer: Medicare Other | Admitting: Internal Medicine

## 2021-12-15 ENCOUNTER — Ambulatory Visit (INDEPENDENT_AMBULATORY_CARE_PROVIDER_SITE_OTHER): Payer: Medicare Other

## 2021-12-15 VITALS — BP 118/70 | HR 75 | Temp 97.5°F | Ht 63.0 in | Wt 159.6 lb

## 2021-12-15 DIAGNOSIS — D351 Benign neoplasm of parathyroid gland: Secondary | ICD-10-CM

## 2021-12-15 DIAGNOSIS — Z0000 Encounter for general adult medical examination without abnormal findings: Secondary | ICD-10-CM | POA: Diagnosis not present

## 2021-12-15 DIAGNOSIS — R5383 Other fatigue: Secondary | ICD-10-CM

## 2021-12-15 DIAGNOSIS — R5381 Other malaise: Secondary | ICD-10-CM | POA: Diagnosis not present

## 2021-12-15 DIAGNOSIS — R300 Dysuria: Secondary | ICD-10-CM | POA: Diagnosis not present

## 2021-12-15 DIAGNOSIS — F419 Anxiety disorder, unspecified: Secondary | ICD-10-CM | POA: Diagnosis not present

## 2021-12-15 DIAGNOSIS — C55 Malignant neoplasm of uterus, part unspecified: Secondary | ICD-10-CM

## 2021-12-15 DIAGNOSIS — L821 Other seborrheic keratosis: Secondary | ICD-10-CM | POA: Insufficient documentation

## 2021-12-15 LAB — CBC WITH DIFFERENTIAL/PLATELET
Basophils Absolute: 0.1 10*3/uL (ref 0.0–0.1)
Basophils Relative: 1.2 % (ref 0.0–3.0)
Eosinophils Absolute: 0.3 10*3/uL (ref 0.0–0.7)
Eosinophils Relative: 4.4 % (ref 0.0–5.0)
HCT: 35.7 % — ABNORMAL LOW (ref 36.0–46.0)
Hemoglobin: 12.2 g/dL (ref 12.0–15.0)
Lymphocytes Relative: 26.7 % (ref 12.0–46.0)
Lymphs Abs: 2.1 10*3/uL (ref 0.7–4.0)
MCHC: 34.2 g/dL (ref 30.0–36.0)
MCV: 90.8 fl (ref 78.0–100.0)
Monocytes Absolute: 0.5 10*3/uL (ref 0.1–1.0)
Monocytes Relative: 7 % (ref 3.0–12.0)
Neutro Abs: 4.7 10*3/uL (ref 1.4–7.7)
Neutrophils Relative %: 60.7 % (ref 43.0–77.0)
Platelets: 413 10*3/uL — ABNORMAL HIGH (ref 150.0–400.0)
RBC: 3.93 Mil/uL (ref 3.87–5.11)
RDW: 13.8 % (ref 11.5–15.5)
WBC: 7.8 10*3/uL (ref 4.0–10.5)

## 2021-12-15 LAB — COMPREHENSIVE METABOLIC PANEL
ALT: 19 U/L (ref 0–35)
AST: 22 U/L (ref 0–37)
Albumin: 4.5 g/dL (ref 3.5–5.2)
Alkaline Phosphatase: 148 U/L — ABNORMAL HIGH (ref 39–117)
BUN: 42 mg/dL — ABNORMAL HIGH (ref 6–23)
CO2: 22 mEq/L (ref 19–32)
Calcium: 9.8 mg/dL (ref 8.4–10.5)
Chloride: 102 mEq/L (ref 96–112)
Creatinine, Ser: 1.79 mg/dL — ABNORMAL HIGH (ref 0.40–1.20)
GFR: 28.1 mL/min — ABNORMAL LOW (ref >60.00)
Glucose, Bld: 97 mg/dL (ref 70–99)
Potassium: 4.5 mEq/L (ref 3.5–5.1)
Sodium: 135 mEq/L (ref 135–145)
Total Bilirubin: 0.3 mg/dL (ref 0.2–1.2)
Total Protein: 7.7 g/dL (ref 6.0–8.3)

## 2021-12-15 LAB — VITAMIN D 25 HYDROXY (VIT D DEFICIENCY, FRACTURES): VITD: 39.67 ng/mL (ref 30.00–100.00)

## 2021-12-15 LAB — TSH: TSH: 1.81 u[IU]/mL (ref 0.35–5.50)

## 2021-12-15 MED ORDER — METHYLPHENIDATE HCL 10 MG PO TABS: 10.0000 mg | ORAL_TABLET | Freq: Every morning | ORAL | 0 refills | Status: DC

## 2021-12-15 NOTE — Assessment & Plan Note (Signed)
s/p right inf parathyroidectomy by Dr Kieth Brightly 10/08/21

## 2021-12-15 NOTE — Assessment & Plan Note (Signed)
Better on a low dose Ritalin 10 mg in AM  Potential benefits of a long term amphetamines  use as well as potential risks  and complications were explained to the patient and were aknowledged.

## 2021-12-15 NOTE — Assessment & Plan Note (Signed)
F/u w/Dr Berline Lopes

## 2021-12-15 NOTE — Assessment & Plan Note (Signed)
Xanax as needed  Potential benefits of a long term benzodiazepines  use as well as potential risks  and complications were explained to the patient and were aknowledged.

## 2021-12-15 NOTE — Patient Instructions (Signed)
[name] , Thank you for taking time to come for your Medicare Wellness Visit. I appreciate your ongoing commitment to your health goals. Please review the following plan we discussed and let me know if I can assist you in the future.   Screening recommendations/referrals: Colonoscopy: 05/12/2016; due every 10 years Mammogram: done at [facility] Bone Density: 08/26/2021; due every 3 years  Recommended yearly ophthalmology/optometry visit for glaucoma screening and checkup Recommended yearly dental visit for hygiene and checkup  Vaccinations: Influenza vaccine: 02/25/2021 Pneumococcal vaccine: 05/05/2016, 05/31/2017 Tdap vaccine: 06/19/2009; due every 10 years (overdue) Shingles vaccine: 02/25/2021   Covid-19: 06/05/2019, 06/23/2019, 02/14/2020  Advanced directives: Yes; documents on file  Conditions/risks identified: Yes  Next appointment: Please schedule your next Medicare Wellness Visit with your Nurse Health Advisor in 1 year by calling [phone].   Preventive Care 29 Years and Older, Female Preventive care refers to lifestyle choices and visits with your health care provider that can promote health and wellness. What does preventive care include? A yearly physical exam. This is also called an annual well check. Dental exams once or twice a year. Routine eye exams. Ask your health care provider how often you should have your eyes checked. Personal lifestyle choices, including: Daily care of your teeth and gums. Regular physical activity. Eating a healthy diet. Avoiding tobacco and drug use. Limiting alcohol use. Practicing safe sex. Taking low-dose aspirin every day. Taking vitamin and mineral supplements as recommended by your health care provider. What happens during an annual well check? The services and screenings done by your health care provider during your annual well check will depend on your age, overall health, lifestyle risk factors, and family  history of disease. Counseling  Your health care provider may ask you questions about your: Alcohol use. Tobacco use. Drug use. Emotional well-being. Home and relationship well-being. Sexual activity. Eating habits. History of falls. Memory and ability to understand (cognition). Work and work Statistician. Reproductive health. Screening  You may have the following tests or measurements: Height, weight, and BMI. Blood pressure. Lipid and cholesterol levels. These may be checked every 5 years, or more frequently if you are over 61 years old. Skin check. Lung cancer screening. You may have this screening every year starting at age 24 if you have a 30-pack-year history of smoking and currently smoke or have quit within the past 15 years. Fecal occult blood test (FOBT) of the stool. You may have this test every year starting at age 42. Flexible sigmoidoscopy or colonoscopy. You may have a sigmoidoscopy every 5 years or a colonoscopy every 10 years starting at age 42. Hepatitis C blood test. Hepatitis B blood test. Sexually transmitted disease (STD) testing. Diabetes screening. This is done by checking your blood sugar (glucose) after you have not eaten for a while (fasting). You may have this done every 1-3 years. Bone density scan. This is done to screen for osteoporosis. You may have this done starting at age 23. Mammogram. This may be done every 1-2 years. Talk to your health care provider about how often you should have regular mammograms. Talk with your health care provider about your test results, treatment options, and if necessary, the need for more tests. Vaccines  Your health care provider may recommend certain vaccines, such as: Influenza vaccine. This is recommended every year. Tetanus, diphtheria, and acellular pertussis (Tdap, Td) vaccine. You may need a Td booster every 10 years. Zoster vaccine. You may need this after age 26. Pneumococcal 13-valent conjugate (  PCV13)  vaccine. One dose is recommended after age 70. Pneumococcal polysaccharide (PPSV23) vaccine. One dose is recommended after age 45. Talk to your health care provider about which screenings and vaccines you need and how often you need them. This information is not intended to replace advice given to you by your health care provider. Make sure you discuss any questions you have with your health care provider. Document Released: 05/30/2015 Document Revised: 01/21/2016 Document Reviewed: 03/04/2015 Elsevier Interactive Patient Education  2017 [place] Prevention in the Home Falls can cause injuries. They can happen to people of all ages. There are many things you can do to make your home safe and to help prevent falls. What can I do on the outside of my home? Regularly fix the edges of walkways and driveways and fix any cracks. Remove anything that might make you trip as you walk through a door, such as a raised step or threshold. Trim any bushes or trees on the path to your home. Use bright outdoor lighting. Clear any walking paths of anything that might make someone trip, such as rocks or tools. Regularly check to see if handrails are loose or broken. Make sure that both sides of any steps have handrails. Any raised decks and porches should have guardrails on the edges. Have any leaves, snow, or ice cleared regularly. Use sand or salt on walking paths during winter. Clean up any spills in your garage right away. This includes oil or grease spills. What can I do in the bathroom? Use night lights. Install grab bars by the toilet and in the tub and shower. Do not use towel bars as grab bars. Use non-skid mats or decals in the tub or shower. If you need to sit down in the shower, use a plastic, non-slip stool. Keep the floor dry. Clean up any water that spills on the floor as soon as it happens. Remove soap buildup in the tub or shower regularly. Attach bath mats securely with  double-sided non-slip rug tape. Do not have throw rugs and other things on the floor that can make you trip. What can I do in the bedroom? Use night lights. Make sure that you have a light by your bed that is easy to reach. Do not use any sheets or blankets that are too big for your bed. They should not hang down onto the floor. Have a firm chair that has side arms. You can use this for support while you get dressed. Do not have throw rugs and other things on the floor that can make you trip. What can I do in the kitchen? Clean up any spills right away. Avoid walking on wet floors. Keep items that you use a lot in easy-to-reach places. If you need to reach something above you, use a strong step stool that has a grab bar. Keep electrical cords out of the way. Do not use floor polish or wax that makes floors slippery. If you must use wax, use non-skid floor wax. Do not have throw rugs and other things on the floor that can make you trip. What can I do with my stairs? Do not leave any items on the stairs. Make sure that there are handrails on both sides of the stairs and use them. Fix handrails that are broken or loose. Make sure that handrails are as long as the stairways. Check any carpeting to make sure that it is firmly attached to the stairs. Fix any carpet that is  loose or worn. Avoid having throw rugs at the top or bottom of the stairs. If you do have throw rugs, attach them to the floor with carpet tape. Make sure that you have a light switch at the top of the stairs and the bottom of the stairs. If you do not have them, ask someone to add them for you. What else can I do to help prevent falls? Wear shoes that: Do not have high heels. Have rubber bottoms. Are comfortable and fit you well. Are closed at the toe. Do not wear sandals. If you use a stepladder: Make sure that it is fully opened. Do not climb a closed stepladder. Make sure that both sides of the stepladder are locked  into place. Ask someone to hold it for you, if possible. Clearly mark and make sure that you can see: Any grab bars or handrails. First and last steps. Where the edge of each step is. Use tools that help you move around (mobility aids) if they are needed. These include: Canes. Walkers. Scooters. Crutches. Turn on the lights when you go into a dark area. Replace any light bulbs as soon as they burn out. Set up your furniture so you have a clear path. Avoid moving your furniture around. If any of your floors are uneven, fix them. If there are any pets around you, be aware of where they are. Review your medicines with your doctor. Some medicines can make you feel dizzy. This can increase your chance of falling. Ask your doctor what other things that you can do to help prevent falls. This information is not intended to replace advice given to you by your health care provider. Make sure you discuss any questions you have with your health care provider. Document Released: 02/27/2009 Document Revised: 10/09/2015 Document Reviewed: 06/07/2014 Elsevier Interactive Patient Education  2017 [employer].

## 2021-12-15 NOTE — Progress Notes (Addendum)
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 acceptance and understanding.  Flu Vaccine status: Up to date  Pneumococcal vaccine status: Up to date  Covid-19 vaccine status: Completed vaccines  Qualifies for Shingles Vaccine? Yes   Zostavax completed No   Shingrix Completed?: No.    Education has been provided regarding the importance of this vaccine. Patient has been advised to call insurance company to determine out of pocket expense if they have not yet received this vaccine. Advised may also receive vaccine at local pharmacy or Health Dept. Verbalized acceptance and understanding.  Screening Tests Health Maintenance  Topic Date Due   MAMMOGRAM  06/15/2012   TETANUS/TDAP  06/20/2019   COVID-19 Vaccine (4 - Booster for Pfizer series) 04/10/2020   Zoster Vaccines- Shingrix (2 of 2) 04/22/2021   INFLUENZA VACCINE  12/15/2021   COLONOSCOPY (Pts 45-7yr Insurance coverage will need to be confirmed)   05/12/2026   Pneumonia Vaccine 71 Years old  Completed   DEXA SCAN  Completed   Hepatitis C Screening  Completed   HPV VACCINES  Aged Out    Health Maintenance  Health Maintenance Due  Topic Date Due   MAMMOGRAM  06/15/2012   TETANUS/TDAP  06/20/2019   COVID-19 Vaccine (4 - Booster for Pfizer series) 04/10/2020   Zoster Vaccines- Shingrix (2 of 2) 04/22/2021   INFLUENZA VACCINE  12/15/2021    Colorectal cancer screening: Type of screening: Colonoscopy. Completed 05/12/2016. Repeat every 10 years  Mammogram status: Completed 2022. Repeat every year  Bone Density status: Completed 08/26/2021. Results reflect: Bone density results: OSTEOPENIA. Repeat every 3-5 years.  Lung Cancer Screening: (Low Dose CT Chest recommended if Age 72-80years, 30 pack-year currently smoking OR have quit w/in 15years.) does not qualify.   Lung Cancer Screening Referral: no  Additional Screening:  Hepatitis C Screening: does qualify; Completed 05/05/2016  Vision Screening: Recommended annual ophthalmology exams for early detection of glaucoma and other disorders of the eye. Is the patient up  to date with their annual eye exam?  Yes  Who is the provider or what is the name of the office in which the patient attends annual eye exams? [facility] If pt is not established with a provider, would they like to be referred to a provider to establish care? No .   Dental Screening: Recommended annual dental exams for proper oral hygiene  Community Resource Referral / Chronic Care Management: CRR required this visit?  No   CCM required this visit?  No      Plan:     I have personally reviewed and noted the following in the patient's chart:   Medical and social history Use of alcohol, tobacco or illicit drugs  Current medications and supplements including opioid prescriptions.  Functional ability and status Nutritional status Physical activity Advanced directives List of other  physicians Hospitalizations, surgeries, and ER visits in previous 12 months Vitals Screenings to include cognitive, depression, and falls Referrals and appointments  In addition, I have reviewed and discussed with patient certain preventive protocols, quality metrics, and best practice recommendations. A written personalized care plan for preventive services as well as general preventive health recommendations were provided to patient.     Sheral Flow, LPN   07/15/2809   Nurse Notes:  Hearing Screening - Comments:: Patient has hearing difficulty. No hearing aids. Vision Screening - Comments:: Patient does wear corrective lenses/contacts.  Eye exam done by: [place] screening examination/treatment/procedure(s) were performed by non-physician practitioner and as supervising physician I was immediately available for consultation/collaboration.  I agree with above. Lew Dawes, MD

## 2021-12-15 NOTE — Progress Notes (Signed)
Subjective:  Patient ID: [name], female    DOB: 1951-04-01  Age: 71 y.o. MRN: [id number]  CC: No chief complaint on file.   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 acetaminophen (TYLENOL) 325 MG tablet Take 2 tablets (650 mg total) by mouth every 6 (six) hours as needed for moderate pain. 90 tablet 2  . albuterol (PROVENTIL) (2.5 MG/3ML) 0.083% nebulizer solution USE 1 VIAL BY NEBULIZATION EVERY 6 (SIX) HOURS AS NEEDED FOR WHEEZING OR SHORTNESS OF BREATH.    Marland Kitchen albuterol (VENTOLIN HFA) 108 (90 Base) MCG/ACT inhaler INHALE 1 2 PUFFS INTO THE LUNGS EVERY 6 (SIX) HOURS AS NEEDED FOR WHEEZING OR SHORTNESS OF BREATH.    Marland Kitchen ALPRAZolam (XANAX) 0.5 MG tablet Take 1 tablet (0.5 mg total) by mouth 3 (three) times daily as needed for anxiety. 90 tablet 3  . aspirin EC 81 MG tablet Take 1 tablet (81 mg total) by mouth daily. 30 tablet 3  . busPIRone (BUSPAR) 30 MG tablet Take 1 tablet (30 mg total) by mouth 2 (two) times daily. 180 tablet 2  . Cholecalciferol (D3 5000) 125 MCG (5000 UT) capsule Take 5,000 Units by mouth daily.    . colesevelam (WELCHOL) 625 MG tablet Take 2 tablets (1,250 mg total) by mouth 2 (two) times daily with a meal. 360 tablet 3  . D-Mannose 500 MG CAPS Take 500 mg by mouth 2 (two) times daily. WITH CRANBERRY & DANDELION EXTRACT 180 capsule 3  . diphenoxylate-atropine (LOMOTIL) 2.5-0.025 MG tablet Take 1 tablet by mouth 4 (four) times daily as needed for diarrhea or loose stools. 60 tablet 1  . esomeprazole (NEXIUM) 40 MG capsule Take 1 capsule (40 mg total) by mouth every morning. 90 capsule 1  . FLUoxetine (PROZAC) 40 MG capsule Take 1 capsule (40 mg total) by mouth every morning. 90 capsule 1  . furosemide (LASIX) 20 MG tablet Take 1-2 tablets (20-40 mg total) by mouth daily as needed for edema. TAKE  1 TO 2 TABLETS BY MOUTH  DAILY AS DIRECTED 200 tablet 2  . ipratropium-albuterol (DUONEB) 0.5-2.5 (3) MG/3ML SOLN Take 3 mLs by nebulization every 6 (six) hours as needed (Asthma).    . iron polysaccharides (FERREX 150) 150 MG capsule Take 1 capsule (150 mg total) by mouth 2 (two) times daily. 180 capsule 3  . levothyroxine (SYNTHROID) 100 MCG tablet Take 1 tablet (100 mcg total) by mouth daily before breakfast. 90 tablet 3  . lisinopril (ZESTRIL) 20 MG tablet Take 1 tablet (20 mg total) by mouth in the morning and at bedtime. (Patient taking differently: Take 20 mg by mouth in the morning and at bedtime. 1-2 TABS as needed) 180 tablet 3  . loperamide (IMODIUM A-D) 2 MG tablet Take 2 mg by mouth 4 (four) times daily as needed for diarrhea or loose stools.    Marland Kitchen METAMUCIL FIBER PO Take 1 capsule by mouth in the morning and at bedtime.    . mirtazapine (REMERON) 30 MG tablet Take 1 tablet (30 mg total) by mouth at bedtime. 90 tablet 1  . omega-3 acid ethyl esters (LOVAZA) 1 g capsule Take 2 capsules (2 g total) by mouth 2 (two) times daily. 360 capsule 2  . ondansetron (ZOFRAN-ODT) 4 MG disintegrating tablet TAKE 1 TABLET BY MOUTH EVERY 8 HOURS AS NEEDED FOR  NAUSEA AND VOMITING    . OVER THE COUNTER MEDICATION Take 1 capsule by mouth daily. 7 MUSHROOM BLEND    . Polyethyl Glycol-Propyl Glycol (SYSTANE OP) Place 1 drop into both eyes 2 (two) times daily as needed (dry eyes).     . sodium chloride (OCEAN) 0.65 % SOLN nasal spray Place 1 spray into both nostrils as needed for congestion.    . SUNSCREEN SPF30 EX     . traMADol (ULTRAM) 50 MG tablet Take 50 mg by mouth every 6 (six) hours as needed for moderate pain.    . vitamin B-12 (CYANOCOBALAMIN) 1000 MCG tablet Take 1,000 mcg by mouth daily.    . vitamin C (ASCORBIC ACID) 500 MG tablet Take 500 mg by mouth daily.    . methylphenidate (RITALIN) 5 MG tablet Take 1 tablet (5 mg total) by mouth 2 (two) times daily with breakfast and lunch. 60 tablet 0    No facility-administered medications prior to visit.    ROS: Review of Systems  Constitutional:  Negative for activity change, appetite change, chills, fatigue and unexpected weight change.  HENT:  Negative for congestion, mouth sores and sinus pressure.   Eyes:  Negative for visual disturbance.  Respiratory:  Negative for cough and chest tightness.   Gastrointestinal:  Negative for abdominal pain and nausea.  Genitourinary:  Negative for difficulty urinating, frequency and vaginal pain.  Musculoskeletal:  Positive for gait problem. Negative for back pain.  Skin:  Negative for pallor and rash.  Neurological:  Negative for dizziness, tremors, weakness, numbness and headaches.  Psychiatric/Behavioral:  Negative for confusion, sleep disturbance and suicidal ideas.     Objective:  BP 118/70 (BP Location: Left Arm, Patient Position: Sitting, Cuff Size: Normal)   Pulse 75   Temp (!) 97.5 F (36.4 C) (Oral)   Ht 5\' 3"  (1.6 m)   Wt 159 lb (72.1 kg)   SpO2 98%   BMI 28.17 kg/m   BP Readings from Last 3 Encounters:  12/15/21 118/70  12/15/21 118/70  11/13/21 (!) 131/94    Wt Readings from Last 3 Encounters:  12/15/21 159 lb (72.1 kg)  12/15/21 159 lb 9.6 oz (72.4 kg)  11/13/21 159 lb (72.1 kg)    Physical Exam Constitutional:      General: She is not in acute distress.    Appearance: She is well-developed.  HENT:     Head: Normocephalic.     Right Ear: External ear normal.     Left Ear: External ear normal.     Nose: Nose normal.  Eyes:     General:        Right eye: No discharge.        Left eye: No discharge.     Conjunctiva/sclera: Conjunctivae normal.     Pupils: Pupils are equal, round, and reactive to light.  Neck:     Thyroid: No thyromegaly.     Vascular: No JVD.     Trachea: No tracheal deviation.  Cardiovascular:     Rate and Rhythm: Normal rate and regular rhythm.     Heart sounds: Normal heart sounds.  Pulmonary:     Effort: No respiratory  distress.     Breath sounds: No stridor. No wheezing.  Abdominal:     General: Bowel sounds are normal. There is no distension.     Palpations: Abdomen is soft. There is no mass.     Tenderness: There is no abdominal tenderness. There is no guarding or rebound.  Musculoskeletal:  General: No tenderness.     Cervical back: Normal range of motion and neck supple. No rigidity.  Lymphadenopathy:     Cervical: No cervical adenopathy.  Skin:    Findings: No erythema or rash.  Neurological:     Cranial Nerves: No cranial nerve deficit.     Motor: No abnormal muscle tone.     Coordination: Coordination normal.     Deep Tendon Reflexes: Reflexes normal.  Psychiatric:        Behavior: Behavior normal.        Thought Content: Thought content normal.        Judgment: Judgment normal.    Lab Results  Component Value Date   WBC 11.8 (H) 10/09/2021   HGB 10.6 (L) 10/09/2021   HCT 32.5 (L) 10/09/2021   PLT 254 10/09/2021   GLUCOSE 128 (H) 10/09/2021   CHOL 263 (H) 10/18/2018   TRIG 271.0 (H) 10/18/2018   HDL 45.40 10/18/2018   LDLDIRECT 160.0 10/18/2018   LDLCALC UNABLE TO CALCULATE IF TRIGLYCERIDE OVER 400 mg/dL 08/26/2017   ALT 22 05/07/2021   AST 27 05/07/2021   NA 137 10/09/2021   K 4.8 10/09/2021   CL 109 10/09/2021   CREATININE 1.45 (H) 10/09/2021   BUN 26 (H) 10/09/2021   CO2 22 10/09/2021   TSH 0.88 05/07/2021   INR 1.0 07/15/2020   HGBA1C 5.5 06/27/2017    No results found.  Assessment & Plan:   Problem List Items Addressed This Visit     Anxiety    Xanax as needed  Potential benefits of a long term benzodiazepines  use as well as potential risks  and complications were explained to the patient and were aknowledged.      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       Relevant Orders   Comprehensive metabolic panel   PTH, intact and calcium   TSH   VITAMIN D 25 Hydroxy (Vit-D Deficiency, Fractures)   CBC with Differential/Platelet   Parathyroid adenoma    s/p right inf parathyroidectomy by Dr Kieth Brightly 10/08/21      Relevant Orders   PTH, intact and calcium   VITAMIN D 25 Hydroxy (Vit-D Deficiency, Fractures)      Meds ordered this encounter  Medications  . methylphenidate (RITALIN) 10 MG tablet    Sig: Take 1 tablet (10 mg total) by mouth in the morning.    Dispense:  90 tablet    Refill:  0    A 3 months supply      Follow-up: Return in about 3 months (around 03/17/2022).  Walker Kehr, MD

## 2021-12-16 ENCOUNTER — Encounter: Payer: Self-pay | Admitting: Internal Medicine

## 2021-12-16 ENCOUNTER — Other Ambulatory Visit: Payer: Self-pay

## 2021-12-16 DIAGNOSIS — R300 Dysuria: Secondary | ICD-10-CM | POA: Diagnosis not present

## 2021-12-16 NOTE — Addendum Note (Signed)
Addended by: Marijean Heath R on: 12/16/2021 04:31 PM   Modules accepted: Orders

## 2021-12-17 LAB — MICROALBUMIN / CREATININE URINE RATIO
Creatinine,U: 53.7 mg/dL
Microalb Creat Ratio: 1.5 mg/g (ref 0.0–30.0)
Microalb, Ur: 0.8 mg/dL (ref 0.0–1.9)

## 2021-12-18 ENCOUNTER — Telehealth: Payer: Self-pay | Admitting: Internal Medicine

## 2021-12-18 LAB — URINE CULTURE

## 2021-12-18 MED ORDER — CIPROFLOXACIN HCL 500 MG PO TABS: 500.0000 mg | ORAL_TABLET | Freq: Two times a day (BID) | ORAL | 0 refills | Status: DC

## 2021-12-18 NOTE — Telephone Encounter (Signed)
Ok done erx to [facility]

## 2021-12-18 NOTE — Telephone Encounter (Signed)
Pt read her results to her urine culture on MyChart and she said the results are positive for a UTI. Pt said she needs ciprofloxacin to clear up the uti.    Please advise

## 2021-12-19 LAB — PTH, INTACT AND CALCIUM
Calcium: 9.5 mg/dL (ref 8.6–10.4)
PTH: 60 pg/mL (ref 16–77)

## 2021-12-20 ENCOUNTER — Other Ambulatory Visit: Payer: Self-pay | Admitting: Internal Medicine

## 2021-12-20 DIAGNOSIS — R3989 Other symptoms and signs involving the genitourinary system: Secondary | ICD-10-CM

## 2021-12-23 ENCOUNTER — Ambulatory Visit (INDEPENDENT_AMBULATORY_CARE_PROVIDER_SITE_OTHER): Payer: Medicare Other | Admitting: Psychology

## 2021-12-23 DIAGNOSIS — F4323 Adjustment disorder with mixed anxiety and depressed mood: Secondary | ICD-10-CM

## 2021-12-23 NOTE — Progress Notes (Signed)
[name] is a 71 y.o. 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 (Status: maintained) -- No Description Entered  Diminished interest in or enjoyment of activities. (Status: maintained) -- No Description Entered  Lack of energy. (Status: maintained) -- No Description Entered  Sad affect, social withdrawal, anxiety, loss of interest in activities, and low energy. (Status: maintained) -- No Description Entered  Medication Status compliance  Safety unspecified If Suicidal or Homicidal State Action Taken: unspecified  Current Risk:  Medications Buspar (Dosage: 16m 2X/day)  New Medication (Dosage: 2539m2x/day)  New Medication (Dosage: 25042mx/day)  Prozac (Dosage: 65m74mily)  Remeron (Dosage: 30mg25mhtly)  xanax (Dosage: .5mg 260m/day)  Objectives Related Problem: Recognize, accept, and cope with feelings of depression. Description: Identify and replace thoughts and beliefs that support depression. Target Date: 2022-04-28 Frequency: Daily Modality: individual Progress: 70%  Related Problem: Recognize, accept, and cope with feelings of depression. Description: Verbalize an understanding of healthy and unhealthy emotions with the intent of increasing the use of healthy emotions to guide actions. Target Date: 2022-04-28 Frequency: Daily Modality: individual Progress: 70%  Related Problem: Recognize,  accept, and cope with feelings of depression. Description: Increasingly verbalize hopeful and positive statements regarding self, others, and the future. Target Date: 2022-04-28 Frequency: Daily Modality: individual Progress: 70%  Related Problem: Reduce fear, anxiety, and worry associated with the medical condition. Description: Share with significant others efforts to adapt successfully to the medical condition/diagnosis. Target Date: 2022-04-28 Frequency: Daily Modality: individual Progress: 80%  Related Problem: Reduce fear, anxiety, and worry associated with the medical condition. Description: Engage in social, productive, and recreational activities that are possible in spite of medical condition. Target Date: 2022-04-28 Frequency: Daily Modality: individual Progress: 40%  Related Problem: Reduce fear, anxiety, and worry associated with the medical condition. Description: Identify the coping skills and sources of emotional support that have been beneficial in the past. Target Date: 2022-04-28 Frequency: Daily Modality: individual Progress: 50%  Client Response full compliance  Service [place] [address] [id number] Code cpt 9[address] [phone] past to present  Facilitate problem  solving  Identify/label emotions  Validate/empathize  Self care activities  Lifestyle change (exercise, nutrition)  Assess/facilitate readiness to change  Relaxation training  Rationally challenge thoughts or beliefs/cognitive restructuring  Comments  Dx: F43.21  Meds: Prozac, [name], patient is struggling to rebuild her life and adjust to her new circumstances. She suffers a negative self-concept and has little confidence in her judgement. She is seeking to reduce symptoms of anxiety and self-doubt and to re-define satisfaction moving forward. Therapy will focus on cognitive restructuring to  redefine her experiences and focus on those satisfying aspects of her life. Will target this goal completion for April1, 2021 (completed). She is also wanting to utilize counseling to adjust and accept her latest diagnosis of cancer. She wants assistance to better manage this news and remain positive throughout treatment. Goal date is 12-22. Patient has made progress, but her medical condition is variable and she requests additional treatment to manage moods throughout her medical ordeal. In addition, she is trying to manage feelings associated with her older son's mental health struggles. Revised goal date is 12-23.  Patient agrees to have a video session. She is at home and I am in home office.   [name] says she still has a hard time getting up and going in the morning. Once up, she is able to stay up and get a lot done. She has had another UTI. She has managed it with the help of Dr. Bishop Limbo. She is feeling grateful and fortunate to be in her current position.  She states that even though she made some poor relationship choices, she is able to focus on more of her positive experiences. She says that she is truly happy....more than she has ever been. Says that all of her needs are met.                                                          Draco Malczewski LEWIS Time: 3:15p-4:00 p45 minutes

## 2021-12-28 ENCOUNTER — Ambulatory Visit (INDEPENDENT_AMBULATORY_CARE_PROVIDER_SITE_OTHER): Payer: Medicare Other | Admitting: Psychology

## 2021-12-28 DIAGNOSIS — F4323 Adjustment disorder with mixed anxiety and depressed mood: Secondary | ICD-10-CM

## 2021-12-28 NOTE — Progress Notes (Signed)
[name] is a 71 y.o. 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 (Status: maintained) -- No Description Entered  Diminished interest in or enjoyment of activities. (Status: maintained) -- No Description Entered  Lack of energy. (Status: maintained) -- No Description Entered  Sad affect, social withdrawal, anxiety, loss of interest in activities, and low energy. (Status: maintained) -- No Description Entered  Medication Status compliance  Safety unspecified If Suicidal or Homicidal State Action Taken: unspecified  Current Risk:  Medications Buspar (Dosage: 47m 2X/day)  New Medication (Dosage: 251m2x/day)  New Medication (Dosage: 25063mx/day)  Prozac (Dosage: 92m79mily)  Remeron (Dosage: 30mg64mhtly)  xanax (Dosage: .5mg 221m/day)  Objectives Related Problem: Recognize, accept, and cope with feelings of depression. Description: Identify and replace thoughts and beliefs that support depression. Target Date: 2022-04-28 Frequency: Daily Modality: individual Progress: 70%  Related Problem: Recognize, accept, and cope with feelings of depression. Description: Verbalize an understanding of healthy and unhealthy emotions with the intent of increasing the use of healthy emotions to guide actions. Target Date: 2022-04-28 Frequency: Daily Modality: individual Progress: 70%   Related Problem: Recognize, accept, and cope with feelings of depression. Description: Increasingly verbalize hopeful and positive statements regarding self, others, and the future. Target Date: 2022-04-28 Frequency: Daily Modality: individual Progress: 70%  Related Problem: Reduce fear, anxiety, and worry associated with the medical condition. Description: Share with significant others efforts to adapt successfully to the medical condition/diagnosis. Target Date: 2022-04-28 Frequency: Daily Modality: individual Progress: 80%  Related Problem: Reduce fear, anxiety, and worry associated with the medical condition. Description: Engage in social, productive, and recreational activities that are possible in spite of medical condition. Target Date: 2022-04-28 Frequency: Daily Modality: individual Progress: 40%  Related Problem: Reduce fear, anxiety, and worry associated with the medical condition. Description: Identify the coping skills and sources of emotional support that have been beneficial in the past. Target Date: 2022-04-28 Frequency: Daily Modality: individual Progress: 50%  Client Response full compliance  Service Location [address], [address]  [place] [address]  Service Code cpt [phone]  Related past to present  Facilitate problem solving  Identify/label emotions  Validate/empathize  Self care activities  Lifestyle change (exercise, nutrition)  Assess/facilitate readiness to change  Relaxation training  Rationally challenge thoughts or beliefs/cognitive restructuring  Comments  Dx: F43.21  Meds: Prozac, [name], patient is struggling to rebuild her life and adjust to her new circumstances. She suffers a negative self-concept and has little confidence in her judgement. She is seeking to reduce symptoms of anxiety and self-doubt and to re-define satisfaction moving forward. Therapy will focus  on cognitive restructuring to redefine her experiences and focus on those satisfying aspects of her life. Will target this goal completion for April1, 2021 (completed). She is also wanting to utilize counseling to adjust and accept her latest diagnosis of cancer. She wants assistance to better manage this news and remain positive throughout treatment. Goal date is 12-22. Patient has made progress, but her medical condition is variable and she requests additional treatment to manage moods throughout her medical ordeal. In addition, she is trying to manage feelings associated with her older son's mental health struggles. Revised goal date is 12-23.  Patient agrees to have a video session. She is at home and I am in home office.   [name] says she met a woman at the grocery store. She said the woman started texting her and wanting to be friends, then offered to pick up a dinner to bring to her (on same day they met). At [place], the woman starting talking about her leg pain. [name] told her she needs to get to a doctor. Turns out the woman is grasping on to [name] and was asking her for a number of favors. She is uncomfortable with what the woman has been asking and the apparent history. She will cut the "relationship" off. Reviewed with her why she "allowed" this person in her life and what signals she ignored or did not see.                                                                 [name] [name] Time: 4:15p-5:00 p45 minutes

## 2021-12-29 ENCOUNTER — Ambulatory Visit (INDEPENDENT_AMBULATORY_CARE_PROVIDER_SITE_OTHER): Payer: Medicare Other | Admitting: Diagnostic Neuroimaging

## 2021-12-29 ENCOUNTER — Encounter: Payer: Self-pay | Admitting: Diagnostic Neuroimaging

## 2021-12-29 VITALS — BP 160/96 | HR 80 | Ht 62.0 in | Wt 161.2 lb

## 2021-12-29 DIAGNOSIS — F909 Attention-deficit hyperactivity disorder, unspecified type: Secondary | ICD-10-CM | POA: Diagnosis not present

## 2021-12-29 DIAGNOSIS — G3184 Mild cognitive impairment, so stated: Secondary | ICD-10-CM | POA: Diagnosis not present

## 2021-12-29 NOTE — Progress Notes (Signed)
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 2016 and 2020 and 2023.  MILD COGNITIVE IMPAIRMENT (likely related to underlying anxiety and depression; possible ADHD) - continue treatments for underlying mood d/o - continue to optimize brain healthy habits (exercise, nutrition, sleep, stimulating activities) --> patient already doing well in these areas  Return for return to PCP, pending if symptoms worsen or fail to improve.    Penni Bombard, MD 1/44/3154, 0:08 PM Certified in Neurology, Neurophysiology and Neuroimaging  [facility] [address], [place], [place] [address] [phone]

## 2022-01-04 ENCOUNTER — Telehealth: Payer: Medicare Other | Admitting: Physician Assistant

## 2022-01-04 DIAGNOSIS — R3989 Other symptoms and signs involving the genitourinary system: Secondary | ICD-10-CM | POA: Diagnosis not present

## 2022-01-04 MED ORDER — SULFAMETHOXAZOLE-TRIMETHOPRIM 800-160 MG PO TABS: 1.0000 | ORAL_TABLET | Freq: Two times a day (BID) | ORAL | 0 refills | Status: DC

## 2022-01-04 NOTE — Progress Notes (Signed)
E-Visit for Urinary Problems  We are sorry that you are not feeling well.  Here is how we plan to help!  Based on what you shared with me it looks like you most likely have a simple urinary tract infection.  A UTI (Urinary Tract Infection) is a bacterial infection of the bladder.  Most cases of urinary tract infections are simple to treat but a key part of your care is to encourage you to drink plenty of fluids and watch your symptoms carefully.  I have prescribed Bactrim DS One tablet twice a day for 5 days.  Your symptoms should gradually improve. Call us if the burning in your urine worsens, you develop worsening fever, back pain or pelvic pain or if your symptoms do not resolve after completing the antibiotic.  Urinary tract infections can be prevented by drinking plenty of water to keep your body hydrated.  Also be sure when you wipe, wipe from front to back and don't hold it in!  If possible, empty your bladder every 4 hours.  HOME CARE Drink plenty of fluids Compete the full course of the antibiotics even if the symptoms resolve Remember, when you need to go.go. Holding in your urine can increase the likelihood of getting a UTI! GET HELP RIGHT AWAY IF: You cannot urinate You get a high fever Worsening back pain occurs You see blood in your urine You feel sick to your stomach or throw up You feel like you are going to pass out  MAKE SURE YOU  Understand these instructions. Will watch your condition. Will get help right away if you are not doing well or get worse.   Thank you for choosing an e-visit.  Your e-visit answers were reviewed by a board certified advanced clinical practitioner to complete your personal care plan. Depending upon the condition, your plan could have included both over the counter or prescription medications.  Please review your pharmacy choice. Make sure the pharmacy is open so you can pick up prescription now. If there is a problem, you may contact  your provider through MyChart messaging and have the prescription routed to another pharmacy.  Your safety is important to us. If you have drug allergies check your prescription carefully.   For the next 24 hours you can use MyChart to ask questions about today's visit, request a non-urgent call back, or ask for a work or school excuse. You will get an email in the next two days asking about your experience. I hope that your e-visit has been valuable and will speed your recovery.  I provided 5 minutes of non face-to-face time during this encounter for chart review and documentation.   

## 2022-01-06 ENCOUNTER — Ambulatory Visit (INDEPENDENT_AMBULATORY_CARE_PROVIDER_SITE_OTHER): Payer: Medicare Other | Admitting: Psychology

## 2022-01-06 DIAGNOSIS — F4323 Adjustment disorder with mixed anxiety and depressed mood: Secondary | ICD-10-CM

## 2022-01-06 NOTE — Progress Notes (Signed)
[name] is a 71 y.o. 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 (Status: maintained) -- No Description Entered  Diminished interest in or enjoyment of activities. (Status: maintained) -- No Description Entered  Lack of energy. (Status: maintained) -- No Description Entered  Sad affect, social withdrawal, anxiety, loss of interest in activities, and low energy. (Status: maintained) -- No Description Entered  Medication Status compliance  Safety unspecified If Suicidal or Homicidal State Action Taken: unspecified  Current Risk:  Medications Buspar (Dosage: 28m 2X/day)  New Medication (Dosage: 2583m2x/day)  New Medication (Dosage: 25036mx/day)  Prozac (Dosage: 72m37mily)  Remeron (Dosage: 30mg4mhtly)  xanax (Dosage: .5mg 24m/day)  Objectives Related Problem: Recognize, accept, and cope with feelings of depression. Description: Identify and replace thoughts and beliefs that support depression. Target Date: 2022-04-28 Frequency: Daily Modality: individual Progress: 70%  Related Problem: Recognize, accept, and cope with feelings of depression. Description: Verbalize an understanding of healthy and unhealthy emotions with the intent of increasing the use of healthy emotions to guide actions. Target Date: 2022-04-28 Frequency: Daily Modality:  individual Progress: 70%  Related Problem: Recognize, accept, and cope with feelings of depression. Description: Increasingly verbalize hopeful and positive statements regarding self, others, and the future. Target Date: 2022-04-28 Frequency: Daily Modality: individual Progress: 70%  Related Problem: Reduce fear, anxiety, and worry associated with the medical condition. Description: Share with significant others efforts to adapt successfully to the medical condition/diagnosis. Target Date: 2022-04-28 Frequency: Daily Modality: individual Progress: 80%  Related Problem: Reduce fear, anxiety, and worry associated with the medical condition. Description: Engage in social, productive, and recreational activities that are possible in spite of medical condition. Target Date: 2022-04-28 Frequency: Daily Modality: individual Progress: 40%  Related Problem: Reduce fear, anxiety, and worry associated with the medical condition. Description: Identify the coping skills and sources of emotional support that have been beneficial in the past. Target Date: 2022-04-28 Frequency: Daily Modality: individual Progress: 50%  Client Response full compliance  Service Location Location, [name] [place], [place] [address]  Service Code cpt [phone]  Related past to present  Facilitate problem solving  Identify/label emotions  Validate/empathize  Self care activities  Lifestyle change (exercise, nutrition)  Assess/facilitate readiness to change  Relaxation training  Rationally challenge thoughts or beliefs/cognitive restructuring  Comments  Dx: F43.21  Meds: Prozac, [name], patient is struggling to rebuild her life and adjust to her new circumstances. She suffers a negative self-concept and has little confidence in her judgement. She is seeking to reduce symptoms of anxiety and self-doubt and to re-define satisfaction moving  forward. Therapy will focus on cognitive restructuring to redefine her experiences and focus on those satisfying aspects of her life. Will target this goal completion for April1, 2021 (completed). She is also wanting to utilize counseling to adjust and accept her latest diagnosis of cancer. She wants assistance to better manage this news and remain positive throughout treatment. Goal date is 12-22. Patient has made progress, but her medical condition is variable and she requests additional treatment to manage moods throughout her medical ordeal. In addition, she is trying to manage feelings associated with her older son's mental health struggles. Revised goal date is 12-23.  Patient agrees to have a video session. She is at home and I am in home office.   [name] is battling another bladder infection. She has been feeling physically bad, but is starting another round of medication. She is seeking my assistance in writing an apology letter to her family (sibs and aunt). She states that she went to meet her father at the cardiologist on Monday afternoon. His dementia has worsened and he can no longer walk. She met him at the office in the waiting room. She is feeling guilty because she says she and her parents are in a co-dependent relationship. After the doctor's appointment, her father was very confused and [name] answered his many questions. He almost fell in the bathroom and [name] caught him before he hit the ground. When she got home, she felt terrible and was weak. She waited until the next morning to call her brother, who knew they went to the doctor. Her brother wanted to know as much as possible about the appointment. He communicated with [name] and she responded to him saying "I have always taken mom and dad to all of their appointments". Her brother asked her why she has physical limitations and she shared about her various ailments.   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 She feels she was overly sensitive in reacting to her brothe. She felt insulted by sibs who consider their own behavior as godly and thinks that she is not. Told her not to feel guilty or apologize. Needs to "let go of it". She agrees and reports feeling better.                                                                     [name] Time: 4:15p-5:00 p45 minutes

## 2022-01-11 ENCOUNTER — Ambulatory Visit (INDEPENDENT_AMBULATORY_CARE_PROVIDER_SITE_OTHER): Payer: Medicare Other | Admitting: Psychology

## 2022-01-11 DIAGNOSIS — F4321 Adjustment disorder with depressed mood: Secondary | ICD-10-CM

## 2022-01-11 DIAGNOSIS — F4323 Adjustment disorder with mixed anxiety and depressed mood: Secondary | ICD-10-CM

## 2022-01-11 NOTE — Progress Notes (Signed)
[name] is a 71 y.o. 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 (Status: maintained) -- No Description Entered  Diminished interest in or enjoyment of activities. (Status: maintained) -- No Description Entered  Lack of energy. (Status: maintained) -- No Description Entered  Sad affect, social withdrawal, anxiety, loss of interest in activities, and low energy. (Status: maintained) -- No Description Entered  Medication Status compliance  Safety unspecified If Suicidal or Homicidal State Action Taken: unspecified  Current Risk:  Medications Buspar (Dosage: 30mg  2X/day)  New Medication (Dosage: 250mg  2x/day)  New Medication (Dosage: 250mg  2x/day)  Prozac (Dosage: 40mg  daily)  Remeron (Dosage: 30mg  nightly)  xanax (Dosage: .5mg  2-3X/day)  Objectives Related Problem: Recognize, accept, and cope with feelings of depression. Description: Identify and replace thoughts and beliefs that support depression. Target Date: 2022-04-28 Frequency: Daily Modality: individual Progress: 70%  Related Problem: Recognize, accept, and cope with feelings of depression. Description: Verbalize an understanding of healthy and unhealthy emotions with the intent of increasing the use of healthy emotions to guide actions. Target Date: 2022-04-28 Frequency: Daily Modality: individual Progress: 70%  Related Problem: Recognize, accept, and cope with feelings of depression. Description: Increasingly verbalize hopeful and positive statements regarding self, others, and the future. Target Date: 2022-04-28 Frequency: Daily Modality: individual Progress: 70%  Related Problem: Reduce fear, anxiety, and worry associated with the medical condition. Description: Share with significant others efforts to adapt successfully to the medical  condition/diagnosis. Target Date: 2022-04-28 Frequency: Daily Modality: individual Progress: 80%  Related Problem: Reduce fear, anxiety, and worry associated with the medical condition. Description: Engage in social, productive, and recreational activities that are possible in spite of medical condition. Target Date: 2022-04-28 Frequency: Daily Modality: individual Progress: 40%  Related Problem: Reduce fear, anxiety, and worry associated with the medical condition. Description: Identify the coping skills and sources of emotional support that have been beneficial in the past. Target Date: 2022-04-28 Frequency: Daily Modality: individual Progress: 50%  Client Response full compliance  Service Location Location, [name] [place], [place] [address]  Service Code cpt [phone]  Related past to present  Facilitate problem solving  Identify/label emotions  Validate/empathize  Self care activities  Lifestyle change (exercise, nutrition)  Assess/facilitate readiness to change  Relaxation training  Rationally challenge thoughts or beliefs/cognitive restructuring  Comments  Dx: F43.21  Meds: Prozac, [name], [name], 2021 (completed). She is also wanting to utilize counseling to adjust and accept her latest diagnosis of cancer. She wants assistance to better manage this news and remain positive throughout treatment. Goal date is 12-22. Patient has made progress, but her medical condition is variable and she  requests additional treatment to manage moods throughout her medical ordeal. In addition, she is trying to manage feelings associated with her older son's mental health struggles. Revised goal date is 12-23.  Patient agrees to have a video session. She is at home and I am in home office.  [name] feels she was sent into a bipolar manic episode by her bladder infection last wee. It took her a few days to feel normal after we last spoke. She felt anger at herself and embarrassed by her "out of control" behavior. She feels that her sibs are much better now that she shared her thoughts. Thinks "they just did not understand". While the result was positive, she was distressed about the process and her inability to "let go". She reports feeling much better and more emotionally stable. She talked about gratitude and her pride of her son's accomplishments.                                                                        [name] Time: 4:15p-5:00 p45 minutes

## 2022-01-20 ENCOUNTER — Telehealth: Payer: Self-pay | Admitting: *Deleted

## 2022-01-20 ENCOUNTER — Ambulatory Visit (INDEPENDENT_AMBULATORY_CARE_PROVIDER_SITE_OTHER): Payer: Medicare Other | Admitting: Psychology

## 2022-01-20 DIAGNOSIS — F4323 Adjustment disorder with mixed anxiety and depressed mood: Secondary | ICD-10-CM

## 2022-01-20 NOTE — Telephone Encounter (Signed)
[name]' dentist saw potential lytic lesions in her jaw on a panoramic x-ray at appointment yesterday 01-19-22. Dentist recommended a head and neck CT to further evaluate lesions. Pt has a CT C/A/P on 02-09-22 and [name] would like the head and neck  CT added to this appointment.  The x-ray e-mail was received and will be reviewed .  Told [name] that Dr. Berline Lopes will review the dental x-ray. Will call her with the recommendations by Dr. Berline Lopes as she is in the [place] today and will return to the clinic tomorrow 01-21-22. Pt verbalized understanding.

## 2022-01-20 NOTE — Telephone Encounter (Signed)
[facility] per patient request. Patient called and left a message regarding her oral xray from yesterday. Spoke with Lanelle Bal at [facility]; Lanelle Bal is e-mailing the oral xrays and office note to the clinic e-mail address.

## 2022-01-20 NOTE — Progress Notes (Signed)
[name] is a 71 y.o. 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 (Status: maintained) -- No Description Entered  Diminished interest in or enjoyment of activities. (Status: maintained) -- No Description Entered  Lack of energy. (Status: maintained) -- No Description Entered  Sad affect, social withdrawal, anxiety, loss of interest in activities, and low energy. (Status: maintained) -- No Description Entered  Medication Status compliance  Safety unspecified If Suicidal or Homicidal State Action Taken: unspecified  Current Risk:  Medications Buspar (Dosage: 30mg  2X/day)  New Medication (Dosage: 250mg  2x/day)  New Medication (Dosage: 250mg  2x/day)  Prozac (Dosage: 40mg  daily)  Remeron (Dosage: 30mg  nightly)  xanax (Dosage: .5mg  2-3X/day)  Objectives Related Problem: Recognize, accept, and cope with feelings of depression. Description: Identify and replace thoughts and beliefs that support depression. Target Date: 2022-04-28 Frequency: Daily Modality: individual Progress: 70%  Related Problem: Recognize, accept, and cope with feelings of depression. Description: Verbalize an understanding of healthy and unhealthy emotions with the intent of increasing the use of healthy emotions to guide actions. Target Date: 2022-04-28 Frequency: Daily Modality: individual Progress: 70%  Related Problem: Recognize, accept, and cope with feelings of depression. Description: Increasingly verbalize hopeful and positive statements regarding self, others, and the future. Target Date: 2022-04-28 Frequency: Daily Modality: individual Progress: 70%  Related Problem: Reduce fear, anxiety, and worry associated with the medical condition. Description: Share with significant others efforts to adapt successfully to  the medical condition/diagnosis. Target Date: 2022-04-28 Frequency: Daily Modality: individual Progress: 80%  Related Problem: Reduce fear, anxiety, and worry associated with the medical condition. Description: Engage in social, productive, and recreational activities that are possible in spite of medical condition. Target Date: 2022-04-28 Frequency: Daily Modality: individual Progress: 40%  Related Problem: Reduce fear, anxiety, and worry associated with the medical condition. Description: Identify the coping skills and sources of emotional support that have been beneficial in the past. Target Date: 2022-04-28 Frequency: Daily Modality: individual Progress: 50%  Client Response full compliance  Service Location Location, [name] [place], [place] [address]  Service Code cpt [phone]  Related past to present  Facilitate problem solving  Identify/label emotions  Validate/empathize  Self care activities  Lifestyle change (exercise, nutrition)  Assess/facilitate readiness to change  Relaxation training  Rationally challenge thoughts or beliefs/cognitive restructuring  Comments  Dx: F43.21  Meds: Prozac, [name], [name], 2021 (completed). She is also wanting to utilize counseling to adjust and accept her latest diagnosis of cancer. She wants assistance to better manage this news and remain positive throughout treatment. Goal date is 12-22. Patient has made progress, [name], she is trying to manage feelings associated with her older son's mental health struggles. Revised goal date is 12-23.  Patient agrees to  have a video session. She is at home and I am in home office.   [name] says she was doing well until yesterday. Had a dental appointment and she had panoramic x-ray. The dentist found a suspicious area on the bone in her jaw. She felt okay initially, but woke up at 3am and could not sleep. Is trying to keep herself busy and keep things in perspective. She is scheduled for a chest/abdomin and pelvic scan on September 26th. She has the opinion that she would rather know than not. She is reporting that she is managing her emotions well. We talked about the need to maintain a positive attitude. She finds humor to be most helpful.                                                                           Ramesh Moan LEWIS Time: 3:10p-4:00 p50 minutes

## 2022-01-21 ENCOUNTER — Telehealth: Payer: Self-pay | Admitting: *Deleted

## 2022-01-21 ENCOUNTER — Other Ambulatory Visit: Payer: Self-pay | Admitting: Gynecologic Oncology

## 2022-01-21 DIAGNOSIS — C55 Malignant neoplasm of uterus, part unspecified: Secondary | ICD-10-CM

## 2022-01-21 DIAGNOSIS — D480 Neoplasm of uncertain behavior of bone and articular cartilage: Secondary | ICD-10-CM

## 2022-01-21 NOTE — Progress Notes (Signed)
Plan to PET scan to evaluate for recurrence and to further evaluate bony lesions noted on recent panoramic dental xrays.

## 2022-01-21 NOTE — Telephone Encounter (Signed)
Spoke with the patient and explained "Dr Berline Lopes has canceled the CT scan and ordered a whole body PET scan. The scan is on 9/15 at 1 pm. Nothing but water 6 hrs prior. Also the office recommends that you go by the dentist office and ask for the xray to be placed on a CD for the radiologist to compare too."  Patient verbalized understanding

## 2022-01-25 ENCOUNTER — Ambulatory Visit (INDEPENDENT_AMBULATORY_CARE_PROVIDER_SITE_OTHER): Payer: Medicare Other | Admitting: Psychology

## 2022-01-25 ENCOUNTER — Other Ambulatory Visit: Payer: Self-pay | Admitting: Internal Medicine

## 2022-01-25 DIAGNOSIS — F4323 Adjustment disorder with mixed anxiety and depressed mood: Secondary | ICD-10-CM
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
## 2022-01-26 ENCOUNTER — Inpatient Hospital Stay: Payer: Medicare Other | Attending: Gynecologic Oncology | Admitting: Dietician

## 2022-01-26 DIAGNOSIS — Z8542 Personal history of malignant neoplasm of other parts of uterus: Secondary | ICD-10-CM | POA: Insufficient documentation

## 2022-01-26 DIAGNOSIS — I129 Hypertensive chronic kidney disease with stage 1 through stage 4 chronic kidney disease, or unspecified chronic kidney disease: Secondary | ICD-10-CM | POA: Insufficient documentation

## 2022-01-26 DIAGNOSIS — N183 Chronic kidney disease, stage 3 unspecified: Secondary | ICD-10-CM | POA: Insufficient documentation

## 2022-01-26 MED ORDER — DIPHENOXYLATE-ATROPINE 2.5-0.025 MG PO TABS: ORAL_TABLET | ORAL | 0 refills | Status: DC

## 2022-01-26 NOTE — Telephone Encounter (Signed)
[facility]ed [facility].../lmb

## 2022-01-26 NOTE — Telephone Encounter (Signed)
[facility]

## 2022-01-26 NOTE — Addendum Note (Signed)
Addended by: Earnstine Regal on: 01/26/2022 12:13 PM   Modules accepted: Orders
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
## 2022-01-27 ENCOUNTER — Telehealth: Payer: Self-pay | Admitting: Cardiology

## 2022-01-27 ENCOUNTER — Ambulatory Visit: Payer: Medicare Other | Admitting: Cardiology

## 2022-01-27 ENCOUNTER — Telehealth: Payer: Self-pay | Admitting: *Deleted

## 2022-01-27 NOTE — Telephone Encounter (Signed)
Pt requesting a provider switch due to her living in [place] and can no longer travel to [place]. Pt would like to be contacted if switch is approved. Please advise

## 2022-01-27 NOTE — Telephone Encounter (Signed)
Rec'd fax pt requesting refill on tramadol. Med has not been filled by MD..Check [place] registry last filled 07/19/2020...[name]

## 2022-01-28 MED ORDER — TRAMADOL HCL 50 MG PO TABS: 50.0000 mg | ORAL_TABLET | Freq: Four times a day (QID) | ORAL | 1 refills | Status: DC | PRN

## 2022-01-28 NOTE — Telephone Encounter (Signed)
Okay.  Thanks.

## 2022-01-29 ENCOUNTER — Encounter (HOSPITAL_COMMUNITY)
Admission: RE | Admit: 2022-01-29 | Discharge: 2022-01-29 | Disposition: A | Discharge status: Home / Self Care | Payer: Medicare Other | Source: Ambulatory Visit | Attending: Gynecologic Oncology | Admitting: Gynecologic Oncology

## 2022-01-29 DIAGNOSIS — C55 Malignant neoplasm of uterus, part unspecified: Secondary | ICD-10-CM | POA: Diagnosis present

## 2022-01-29 DIAGNOSIS — D480 Neoplasm of uncertain behavior of bone and articular cartilage: Secondary | ICD-10-CM

## 2022-01-29 LAB — GLUCOSE, CAPILLARY: Glucose-Capillary: 116 mg/dL — ABNORMAL HIGH (ref 70–99)

## 2022-01-29 MED ORDER — FLUDEOXYGLUCOSE F - 18 (FDG) INJECTION: 8.0000 | Freq: Once | INTRAVENOUS | Status: DC | PRN

## 2022-02-02 ENCOUNTER — Telehealth: Payer: Self-pay | Admitting: Gynecologic Oncology

## 2022-02-02 NOTE — Telephone Encounter (Signed)
Spoke with patient about PET scan.  No findings of metastatic disease.  Discussed with radiology, no recommendations for further evaluation of the mandible.  I have reached out to the patient's dentist to let her know PET scan results.  Jeral Pinch MD Gynecologic Oncology

## 2022-02-03 ENCOUNTER — Ambulatory Visit (INDEPENDENT_AMBULATORY_CARE_PROVIDER_SITE_OTHER): Payer: Medicare Other | Admitting: Psychology

## 2022-02-03 DIAGNOSIS — F4323 Adjustment disorder with mixed anxiety and depressed mood: Secondary | ICD-10-CM | POA: Diagnosis not present

## 2022-02-03 NOTE — Progress Notes (Signed)
[name] is a 71 y.o. 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 (Status: maintained) -- No Description Entered  Diminished interest in or enjoyment of activities. (Status: maintained) -- No Description Entered  Lack of energy. (Status: maintained) -- No Description Entered  Sad affect, social withdrawal, anxiety, loss of interest in activities, and low energy. (Status: maintained) -- No Description Entered  Medication Status compliance  Safety unspecified If Suicidal or Homicidal State Action Taken: unspecified  Current Risk:  Medications Buspar (Dosage: 30mg  2X/day)  New Medication (Dosage: 250mg  2x/day)  New Medication (Dosage: 250mg  2x/day)  Prozac (Dosage: 40mg  daily)  Remeron (Dosage: 30mg  nightly)  xanax (Dosage: .5mg  2-3X/day)  Objectives Related Problem: Recognize, accept, and cope with feelings of depression. Description: Identify and replace thoughts and beliefs that support depression. Target Date: 2022-04-28 Frequency: Daily Modality: individual Progress: 70%  Related Problem: Recognize, accept, and cope with feelings of depression. Description: Verbalize an understanding of healthy and unhealthy emotions with the intent of increasing the use of healthy emotions to guide actions. Target Date: 2022-04-28 Frequency: Daily Modality: individual Progress: 70%  Related Problem: Recognize, accept, and cope with feelings of depression. Description: Increasingly verbalize hopeful and positive statements regarding self, others, and the future. Target Date: 2022-04-28 Frequency: Daily Modality: individual Progress: 70%  Related Problem: Reduce fear, anxiety, and worry associated with the medical condition. Description:  Share with significant others efforts to adapt successfully to the medical condition/diagnosis. Target Date: 2022-04-28 Frequency: Daily Modality: individual Progress: 80%  Related Problem: Reduce fear, anxiety, and worry associated with the medical condition. Description: Engage in social, productive, and recreational activities that are possible in spite of medical condition. Target Date: 2022-04-28 Frequency: Daily Modality: individual Progress: 40%  Related Problem: Reduce fear, anxiety, and worry associated with the medical condition. Description: Identify the coping skills and sources of emotional support that have been beneficial in the past. Target Date: 2022-04-28 Frequency: Daily Modality: individual Progress: 50%  Client Response full compliance  Service Location Location, [name] [place], [place] [address]  Service Code cpt [phone]  Related past to present  Facilitate problem solving  Identify/label emotions  Validate/empathize  Self care activities  Lifestyle change (exercise, nutrition)  Assess/facilitate readiness to change  Relaxation training  Rationally challenge thoughts or beliefs/cognitive restructuring  Comments  Dx: F43.21  Meds: Prozac, [name], [name], 2021 (completed). She is also wanting to utilize counseling to adjust and accept her latest diagnosis of cancer. She wants assistance to better manage this news and remain positive throughout treatment. Goal date is  12-22. Patient has made progress, but her medical condition is variable and she requests additional treatment to manage  moods throughout her medical ordeal. In addition, she is trying to manage feelings associated with her older son's mental health struggles. Revised goal date is 12-23.  Patient agrees to have a video session. She is at home and I am in home office.   [name] had her body scan last Friday but did not get the results until yesterday afternoon. She says she was highly anxious until she got the results, fearing that she was going to find out she has cancer in her jaw. She became somewhat manic while she was waiting. t Turns out that her scan indicated no evidence of cancer. She is very relieved, but still sounds a bit manic. Having flight of thought, rapid speech patterns and difficulty staying on topic. We talked about the need to now slow down and use her [employer] book.                                                                                      [name] Time: 4:10p-5:00 p50 minutes

## 2022-02-04 DIAGNOSIS — H524 Presbyopia: Secondary | ICD-10-CM | POA: Diagnosis not present

## 2022-02-04 DIAGNOSIS — Z961 Presence of intraocular lens: Secondary | ICD-10-CM | POA: Insufficient documentation

## 2022-02-04 DIAGNOSIS — H52203 Unspecified astigmatism, bilateral: Secondary | ICD-10-CM | POA: Diagnosis not present

## 2022-02-04 DIAGNOSIS — H5203 Hypermetropia, bilateral: Secondary | ICD-10-CM | POA: Diagnosis not present

## 2022-02-04 DIAGNOSIS — H04123 Dry eye syndrome of bilateral lacrimal glands: Secondary | ICD-10-CM | POA: Diagnosis not present

## 2022-02-08 ENCOUNTER — Ambulatory Visit: Payer: Medicare Other | Admitting: Psychology

## 2022-02-09 ENCOUNTER — Other Ambulatory Visit (HOSPITAL_COMMUNITY): Payer: Medicare Other

## 2022-02-11 ENCOUNTER — Ambulatory Visit (INDEPENDENT_AMBULATORY_CARE_PROVIDER_SITE_OTHER): Payer: Medicare Other | Admitting: Psychology

## 2022-02-11 DIAGNOSIS — F4323 Adjustment disorder with mixed anxiety and depressed mood: Secondary | ICD-10-CM | POA: Diagnosis not present

## 2022-02-11 NOTE — Progress Notes (Signed)
[name] is a 71 y.o. 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 (Status: maintained) -- No Description Entered  Diminished interest in or enjoyment of activities. (Status: maintained) -- No Description Entered  Lack of energy. (Status: maintained) -- No Description Entered  Sad affect, social withdrawal, anxiety, loss of interest in activities, and low energy. (Status: maintained) -- No Description Entered  Medication Status compliance  Safety unspecified If Suicidal or Homicidal State Action Taken: unspecified  Current Risk:  Medications Buspar (Dosage: 30mg  2X/day)  New Medication (Dosage: 250mg  2x/day)  New Medication (Dosage: 250mg  2x/day)  Prozac (Dosage: 40mg  daily)  Remeron (Dosage: 30mg  nightly)  xanax (Dosage: .5mg  2-3X/day)  Objectives Related Problem: Recognize, accept, and cope with feelings of depression. Description: Identify and replace thoughts and beliefs that support depression. Target Date: 2022-04-28 Frequency: Daily Modality: individual Progress: 70%  Related Problem: Recognize, accept, and cope with feelings of depression. Description: Verbalize an understanding of healthy and unhealthy emotions with the intent of increasing the use of healthy emotions to guide actions. Target Date: 2022-04-28 Frequency: Daily Modality: individual Progress: 70%  Related Problem: Recognize, accept, and cope with feelings of depression. Description: Increasingly verbalize hopeful and positive statements regarding self, others, and the future. Target Date: 2022-04-28 Frequency: Daily Modality: individual Progress: 70%  Related Problem: Reduce fear, anxiety, and worry associated with the medical  condition. Description: Share with significant others efforts to adapt successfully to the medical condition/diagnosis. Target Date: 2022-04-28 Frequency: Daily Modality: individual Progress: 80%  Related Problem: Reduce fear, anxiety, and worry associated with the medical condition. Description: Engage in social, productive, and recreational activities that are possible in spite of medical condition. Target Date: 2022-04-28 Frequency: Daily Modality: individual Progress: 40%  Related Problem: Reduce fear, anxiety, and worry associated with the medical condition. Description: Identify the coping skills and sources of emotional support that have been beneficial in the past. Target Date: 2022-04-28 Frequency: Daily Modality: individual Progress: 50%  Client Response full compliance  Service Location Location, [name] [place], [place] [address]  Service Code cpt [phone]  Related past to present  Facilitate problem solving  Identify/label emotions  Validate/empathize  Self care activities  Lifestyle change (exercise, nutrition)  Assess/facilitate readiness to change  Relaxation training  Rationally challenge thoughts or beliefs/cognitive restructuring  Comments  Dx: F43.21  Meds: Prozac, [name], [name], 2021 (completed). She is also wanting to utilize counseling to adjust and accept her latest diagnosis of cancer. She wants assistance to better manage this news and remain positive throughout  treatment. Goal date is 12-22. Patient has  made progress, but her medical condition is variable and she requests additional treatment to manage moods throughout her medical ordeal. In addition, she is trying to manage feelings associated with her older son's mental health struggles. Revised goal date is 12-23.  Patient agrees to have a video session. She is at home and I am in home office.   [name] states that she is finding that it is "really helpful" to write regularly in her journal. She spent time reflecting on the separation and divorce from her husband [name]. It was a traumatic period in her life. It was made worse by her ex-husband's unwillingness to cooperate. She is reflecting on this time because this weekend was her son [name]'s birthday. It was very difficult for her. She feels many of the problems he had started around the divorce. We discussed allowing herself to grieve, yet remain optimistic that [name] will work through his difficulties and they can reunite.                                                                                          [name] Time: 4:10p-5:00 p50 minutes

## 2022-02-12 ENCOUNTER — Other Ambulatory Visit: Payer: Self-pay

## 2022-02-12 ENCOUNTER — Encounter: Payer: Self-pay | Admitting: Gynecologic Oncology

## 2022-02-12 ENCOUNTER — Inpatient Hospital Stay (HOSPITAL_BASED_OUTPATIENT_CLINIC_OR_DEPARTMENT_OTHER): Payer: Medicare Other | Admitting: Gynecologic Oncology

## 2022-02-12 VITALS — BP 142/98 | HR 83 | Temp 98.3°F | Resp 16 | Ht 62.0 in | Wt 157.0 lb

## 2022-02-12 DIAGNOSIS — C55 Malignant neoplasm of uterus, part unspecified: Secondary | ICD-10-CM

## 2022-02-12 DIAGNOSIS — Z8542 Personal history of malignant neoplasm of other parts of uterus: Secondary | ICD-10-CM | POA: Diagnosis present

## 2022-02-12 DIAGNOSIS — I129 Hypertensive chronic kidney disease with stage 1 through stage 4 chronic kidney disease, or unspecified chronic kidney disease: Secondary | ICD-10-CM | POA: Diagnosis not present

## 2022-02-12 DIAGNOSIS — N183 Chronic kidney disease, stage 3 unspecified: Secondary | ICD-10-CM | POA: Diagnosis not present

## 2022-02-12 NOTE — Patient Instructions (Signed)
It was good to see you today.  I do not see or feel any evidence of cancer recurrence on your exam.  We will see you back for follow-up in 3 months.  We will plan to order imaging at that time for March.  As always, please call if you develop any new and concerning symptoms before your next visit.

## 2022-02-12 NOTE — Progress Notes (Signed)
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    methylphenidate (RITALIN) 10 MG tablet, Take 1 tablet (10 mg total) by mouth in the morning., Disp: 90 tablet, Rfl: 0   mirtazapine (REMERON) 30 MG tablet, Take 1 tablet (30 mg total) by mouth at bedtime., Disp: 90 tablet, Rfl: 1   omega-3 acid ethyl esters (LOVAZA) 1 g capsule, Take 2 capsules (2 g total) by mouth 2 (two) times daily., Disp: 360 capsule, Rfl: 2   ondansetron (ZOFRAN-ODT) 4 MG disintegrating tablet, TAKE 1 TABLET BY MOUTH EVERY 8 HOURS AS NEEDED FOR NAUSEA AND VOMITING, Disp: , Rfl:    OVER THE COUNTER MEDICATION, Take 1 capsule by mouth daily. 7 MUSHROOM BLEND, Disp: , Rfl:    Polyethyl Glycol-Propyl Glycol (SYSTANE OP), Place 1 drop into both eyes 2 (two) times daily as needed (dry eyes). , Disp: , Rfl:    sodium chloride (OCEAN) 0.65 % SOLN nasal spray, Place 1 spray into both nostrils as needed for congestion., Disp: , Rfl:    sulfamethoxazole-trimethoprim (BACTRIM DS) 800-160 MG tablet, Take 1 tablet by mouth 2 (two) times daily., Disp: 10 tablet, Rfl: 0   SUNSCREEN SPF30 EX, , Disp: , Rfl:    traMADol (ULTRAM) 50 MG  tablet, Take 1 tablet (50 mg total) by mouth every 6 (six) hours as needed for moderate pain., Disp: 100 tablet, Rfl: 1   vitamin B-12 (CYANOCOBALAMIN) 1000 MCG tablet, Take 1,000 mcg by mouth daily., Disp: , Rfl:    vitamin C (ASCORBIC ACID) 500 MG tablet, Take 500 mg by mouth daily., Disp: , Rfl:   Review of Systems: + joint pain Denies appetite changes, fevers, chills, fatigue, unexplained weight changes. Denies hearing loss, neck lumps or masses, mouth sores, ringing in ears or voice changes. Denies cough or wheezing.  Denies shortness of breath. Denies chest pain or palpitations. Denies leg swelling. Denies abdominal distention, pain, blood in stools, constipation, diarrhea, nausea, vomiting, or early satiety. Denies pain with intercourse, dysuria, frequency, hematuria or incontinence. Denies hot flashes, pelvic pain, vaginal bleeding or vaginal discharge.   Denies back pain or muscle pain/cramps. Denies itching, rash, or wounds. Denies dizziness, headaches, numbness or seizures. Denies swollen lymph nodes or glands, denies easy bruising or bleeding. Denies anxiety, depression, confusion, or decreased concentration.  Physical Exam: BP (!) 142/98 (BP Location: Right Arm, Patient Position: Sitting)   Pulse 83   Temp 98.3 F (36.8 C) (Oral)   Resp 16   Ht 5\' 2"  (1.575 m)   Wt 157 lb (71.2 kg)   SpO2 100%   BMI 28.72 kg/m  General: Alert, oriented, no acute distress. HEENT: Normocephalic, atraumatic, sclera anicteric. Chest: Clear to auscultation bilaterally.  No wheezes or rhonchi. Cardiovascular: Regular rate and rhythm, no murmurs. Abdomen: soft, nontender.  Normoactive bowel sounds.  No masses or hepatosplenomegaly appreciated.  Well-healed incisions. Extremities: Grossly normal range of motion.  Warm, well perfused.  No edema bilaterally. Skin: No rashes or lesions noted. Lymphatics: No cervical, supraclavicular, or inguinal adenopathy. GU: Normal appearing external  genitalia without erythema, excoriation, or lesions.  Speculum exam reveals mildly atrophic vaginal mucosa, no bleeding or discharge, no masses.  Bimanual exam reveals cuff smooth, no nodularity or masses.  Rectovaginal exam confirms these findings.  Laboratory & Radiologic Studies: See recent PET in treatment history  Assessment & Plan:  [name] is a 71 y.o. woman with history of recurrent LMS (most recent oligometastatic recurrence resected with negative margins in 07/2020) who presents for follow-up.   Patient is NED on exam today. Overall doing very well.   I will plan to see her for follow-up in 3 months.  We will plan for repeat imaging 6 months after her recent PET scan, in March 2024.  I have asked her to call with any new or concerning symptoms before her next visit.  22 minutes of total time was spent for this patient encounter, including preparation, face-to-face counseling with the patient and coordination of care, and documentation of the encounter.  Jeral Pinch, MD  Division of Gynecologic Oncology  Department of Obstetrics and Gynecology  [facility]

## 2022-02-17 ENCOUNTER — Ambulatory Visit (INDEPENDENT_AMBULATORY_CARE_PROVIDER_SITE_OTHER): Payer: Medicare Other | Admitting: Psychology

## 2022-02-17 DIAGNOSIS — F4323 Adjustment disorder with mixed anxiety and depressed mood: Secondary | ICD-10-CM

## 2022-02-17 NOTE — Progress Notes (Signed)
[name] is a 71 y.o. 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 (Status: maintained) -- No Description Entered  Diminished interest in or enjoyment of activities. (Status: maintained) -- No Description Entered  Lack of energy. (Status: maintained) -- No Description Entered  Sad affect, social withdrawal, anxiety, loss of interest in activities, and low energy. (Status: maintained) -- No Description Entered  Medication Status compliance  Safety unspecified If Suicidal or Homicidal State Action Taken: unspecified  Current Risk:  Medications Buspar (Dosage: 30mg  2X/day)  New Medication (Dosage: 250mg  2x/day)  New Medication (Dosage: 250mg  2x/day)  Prozac (Dosage: 40mg  daily)  Remeron (Dosage: 30mg  nightly)  xanax (Dosage: .5mg  2-3X/day)  Objectives Related Problem: Recognize, accept, and cope with feelings of depression. Description: Identify and replace thoughts and beliefs that support depression. Target Date: 2022-04-28 Frequency: Daily Modality: individual Progress: 70%  Related Problem: Recognize, accept, and cope with feelings of depression. Description: Verbalize an understanding of healthy and unhealthy emotions with the intent of increasing the use of healthy emotions to guide actions. Target Date: 2022-04-28 Frequency: Daily Modality: individual Progress: 70%  Related Problem: Recognize, accept, and cope with feelings of depression. Description: Increasingly verbalize hopeful and positive statements regarding self, others, and the future. Target Date: 2022-04-28 Frequency: Daily Modality: individual Progress: 80%  Related Problem: Reduce fear, anxiety, and worry associated with the medical condition. Description: Share with significant others efforts to adapt successfully to the medical  condition/diagnosis. Target Date: 2022-04-28 Frequency: Daily Modality: individual Progress: 80%  Related Problem: Reduce fear, anxiety, and worry associated with the medical condition. Description: Engage in social, productive, and recreational activities that are possible in spite of medical condition. Target Date: 2022-04-28 Frequency: Daily Modality: individual Progress: 70%  Related Problem: Reduce fear, anxiety, and worry associated with the medical condition. Description: Identify the coping skills and sources of emotional support that have been beneficial in the past. Target Date: 2022-04-28 Frequency: Daily Modality: individual Progress: 58%  Client Response full compliance  Service Location Location, [name] [place], [place] [address]  Service Code cpt [phone]  Related past to present  Facilitate problem solving  Identify/label emotions  Validate/empathize  Self care activities  Lifestyle change (exercise, nutrition)  Assess/facilitate readiness to change  Relaxation training  Rationally challenge thoughts or beliefs/cognitive restructuring  Comments  Dx: F43.21  Meds: Prozac, [name], patient is struggling to rebuild [name] life and adjust to [name] new circumstances. [name] suffers a negative self-concept and has little confidence in [name] judgement. [name] is seeking to reduce symptoms of anxiety and self-doubt and to re-define satisfaction moving forward. Therapy will focus on cognitive restructuring to redefine [name] experiences and focus on those satisfying aspects of [name] life. Will target this goal completion for April1, 2021 (completed). [name] is also wanting to utilize counseling to adjust and accept [name] latest diagnosis of cancer. [name] wants assistance to better manage this news and remain positive throughout treatment. Goal date is 12-22. Patient has made progress, but [name] medical condition is variable and [name]  requests additional treatment to manage moods throughout [name] medical ordeal. In addition, [name] is trying to manage feelings associated with [name] older son's mental health struggles. Revised goal date is 12-23.  Patient agrees to have a video session. [name] is at home and I am in home office.  [name] states that [name] saw [name] oncologist on Friday because of the very high probability that it will return. [name] knows [name] has "beaten the odds" with regard to survival, [name] anticipates that it will return at some point. [name] doctor told [name] "you are living on bonus time". [name] anticipates that when it returns [name] will go on chemo and have surgery again. [name] states that [name] has lost weight and feeling much better. [name] is back in [name] old clothing and has more energy. [name] says that each day is slightly better. [name] has been cleaning/purging [name] house. [name] says [name] is "happier than [name] has ever been", and is very satisfied with [name] home.                                                                                              [name] Time: 3:10p-4:00p 50 minutes

## 2022-02-22 ENCOUNTER — Telehealth: Payer: Self-pay | Admitting: Cardiology

## 2022-02-22 ENCOUNTER — Other Ambulatory Visit: Payer: Self-pay | Admitting: Internal Medicine

## 2022-02-22 ENCOUNTER — Ambulatory Visit: Payer: Medicare Other | Admitting: Psychology

## 2022-02-22 ENCOUNTER — Telehealth: Payer: Self-pay

## 2022-02-22 NOTE — Telephone Encounter (Signed)
Patient called in and stated that she needed to have an appointment scheduled with a registered dietician and a nurse navigator.  She states that "her heart is racing, she feels nauseated, and really hot, not running a fever but just really hot." She stated she could use Ice packs and drink really cold water and it subsides.  Advised patient that I would send note to doctor and let them know.

## 2022-02-22 NOTE — Telephone Encounter (Signed)
I called the pt re: her message to My Chart messaging.... she says she has been having trouble the last few weeks:   Intermittent episodes of tachycardia with shortness of breath,nausea,chest tightness, headaches and fatigue. These symptoms include sudden onsets of feeling terribly hot and breaking into profuse sweats.  She has been eating and drinking  well but not sure what precipitates these symptoms... she is feeling well today but asking to be seen this week.... I made her an appt for his Thursday but if her symptoms reoccur or worsen she will consider going to the ED.

## 2022-02-22 NOTE — Telephone Encounter (Signed)
  Per MyChart scheduling message:  After reviewing my previous Blood ph levels I see that they have recently increased dramatically and wonder if metabolic acidosis is a factor as I have stage 4 chronic kidney disease.  I need a referral for a registered dietitian

## 2022-02-22 NOTE — Telephone Encounter (Signed)
Pt states an understanding and will call for an appointment with her PCP/Endocrinologist.   She asked if our office could refer her to a dietician. She prefers to see someone within the cancer center.

## 2022-02-22 NOTE — Telephone Encounter (Signed)
Pt aware dieticians at the [facility] are prioritizing patients on internal nutrition and severely malnourished patients.   [facility] number given to pt.

## 2022-02-22 NOTE — Telephone Encounter (Signed)
  Per MyChart scheduling message #2:  Intermittent episodes of tachycardia with shortness of breath,nausea,chest tightness, headaches and fatigue. These symptoms include sudden onsets of feeling terribly hot and breaking into profuse sweats. Even though I have been staying in the house with the air conditioning on 68 degrees. Have to get ice packs from the freezer and drink ice water. It has gotten worse over the last couple of weeks.  Thank you. [facility]

## 2022-02-22 NOTE — Telephone Encounter (Signed)
Could someone call her to discuss symptoms and any other new symptoms she may be having? Given her parathyroid history, she may need to reach out to her PCP and/or endocrinologist about these symptoms.

## 2022-02-24 ENCOUNTER — Ambulatory Visit (INDEPENDENT_AMBULATORY_CARE_PROVIDER_SITE_OTHER): Payer: Medicare Other | Admitting: Psychology

## 2022-02-24 DIAGNOSIS — F4323 Adjustment disorder with mixed anxiety and depressed mood: Secondary | ICD-10-CM
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
## 2022-02-24 NOTE — Progress Notes (Addendum)
Aviv Rota LEWIS Time: 4:10p-5:00 p50 minutes   [name] is a 71 y.o. 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 (Status: maintained) -- No Description Entered  Diminished interest in or enjoyment of activities. (Status: maintained) -- No Description Entered  Lack of energy. (Status: maintained) -- No Description Entered  Sad affect, social withdrawal, anxiety, loss of interest in activities, and low energy. (Status: maintained) -- No Description Entered  Medication Status compliance  Safety unspecified If Suicidal or Homicidal State Action Taken: unspecified  Current Risk:  Medications Buspar (Dosage: 30mg  2X/day)  New Medication (Dosage: 250mg  2x/day)  New Medication (Dosage: 250mg  2x/day)  Prozac (Dosage: 40mg  daily)  Remeron (Dosage: 30mg  nightly)  xanax (Dosage: .5mg  2-3X/day)  Objectives Related Problem: Recognize, accept, and cope with feelings of depression. Description: Identify and replace thoughts and beliefs that support depression. Target Date: 2022-04-28 Frequency: Daily Modality: individual Progress: 70%  Related Problem: Recognize, accept, and cope with feelings of depression. Description: Verbalize an understanding of healthy and unhealthy emotions with the intent of increasing the use of healthy emotions to guide actions. Target Date: 2022-04-28 Frequency: Daily Modality: individual Progress: 70%  Related Problem: Recognize, accept, and cope with feelings of depression. Description: Increasingly verbalize hopeful and positive statements regarding self, others, and the future. Target Date: 2022-04-28 Frequency: Daily Modality: individual Progress: 80%  Related Problem: Reduce fear, anxiety, and worry associated with the medical condition. Description: Share  with significant others efforts to adapt successfully to the medical condition/diagnosis. Target Date: 2022-04-28 Frequency: Daily Modality: individual Progress: 80%  Related Problem: Reduce fear, anxiety, and worry associated with the medical condition. Description: Engage in social, productive, and recreational activities that are possible in spite of medical condition. Target Date: 2022-04-28 Frequency: Daily Modality: individual Progress: 70%  Related Problem: Reduce fear, anxiety, and worry associated with the medical condition. Description: Identify the coping skills and sources of emotional support that have been beneficial in the past. Target Date: 2022-04-28 Frequency: Daily Modality: individual Progress: 58%  Client Response full compliance  Service Location Location, [name] [place], [place] [address]  Service Code cpt [phone]  Related past to present  Facilitate problem solving  Identify/label emotions  Validate/empathize  Self care activities  Lifestyle change (exercise, nutrition)  Assess/facilitate readiness to change  Relaxation training  Rationally challenge thoughts or beliefs/cognitive restructuring  Comments  Dx: F43.21  Meds: Prozac, [name], [name], 2021 (completed). She is also wanting to utilize counseling to adjust and accept her latest diagnosis of cancer. She wants assistance to better manage this news and remain positive throughout treatment. Goal date is 12-22.  Patient has made progress, but her medical condition is variable and she requests additional treatment to manage moods throughout her medical ordeal. In addition, she is trying to manage feelings associated with her older son's mental health struggles.  Revised goal date is 12-23.  Patient agrees to have a video session. She is at home and I am in home office.   [name] had some significant concerns about some of her recent medical symptoms. She became significantly anxious and employed some cognitive strategies to calm herself. She also reviewed "My Chart" to review her medical history and lab reports. She discovered some abnormal labs that were never discussed with her. The associated symptoms match what she has been experiencing. She "took matters into her own hand" and scheduled appointments with appropriate physicians. Expressed concerns that this was missed by her treatment team. She expressed relief that she has appointments set up. Moods are beeter and anxiety intact.                                                                                              Jayen Bromwell LEWIS Time: 3:10p-4:00p 50 minutes

## 2022-02-25 ENCOUNTER — Telehealth: Payer: Self-pay

## 2022-02-25 ENCOUNTER — Ambulatory Visit: Payer: Medicare Other | Attending: Nurse Practitioner | Admitting: Nurse Practitioner

## 2022-02-25 ENCOUNTER — Other Ambulatory Visit: Payer: Self-pay | Admitting: Nurse Practitioner

## 2022-02-25 ENCOUNTER — Ambulatory Visit: Payer: Medicare Other

## 2022-02-25 ENCOUNTER — Encounter: Payer: Self-pay | Admitting: Nurse Practitioner

## 2022-02-25 VITALS — BP 128/80 | HR 82 | Ht 62.0 in | Wt 157.0 lb

## 2022-02-25 DIAGNOSIS — R002 Palpitations: Secondary | ICD-10-CM

## 2022-02-25 DIAGNOSIS — R000 Tachycardia, unspecified: Secondary | ICD-10-CM

## 2022-02-25 DIAGNOSIS — I10 Essential (primary) hypertension: Secondary | ICD-10-CM

## 2022-02-25 DIAGNOSIS — I491 Atrial premature depolarization: Secondary | ICD-10-CM

## 2022-02-25 DIAGNOSIS — E7849 Other hyperlipidemia: Secondary | ICD-10-CM

## 2022-02-25 DIAGNOSIS — I2510 Atherosclerotic heart disease of native coronary artery without angina pectoris: Secondary | ICD-10-CM | POA: Diagnosis not present

## 2022-02-25 DIAGNOSIS — R0602 Shortness of breath: Secondary | ICD-10-CM

## 2022-02-25 DIAGNOSIS — R42 Dizziness and giddiness: Secondary | ICD-10-CM

## 2022-02-25 NOTE — Progress Notes (Unsigned)
Enrolled for [employer] to mail a ZIO XT long term holter monitor to the patients address on file.   Dr.Brian Munley to read.

## 2022-02-25 NOTE — Telephone Encounter (Signed)
Patient is calling in requesting an urgent referral to an Endocrinologist for irregular heart rate caused by hormonal imbalance. Asked for a call back once completed if possible.

## 2022-02-25 NOTE — Patient Instructions (Signed)
Medication Instructions:  Your physician recommends that you continue on your current medications as directed. Please refer to the Current Medication list given to you today.   *If you need a refill on your cardiac medications before your next appointment, please call your pharmacy*   Lab Work: None ordered   If you have labs (blood work) drawn today and your tests are completely normal, you will receive your results only by: [place] (if you have MyChart) OR A paper copy in the mail If you have any lab test that is abnormal or we need to change your treatment, we will call you to review the results.   Testing/Procedures: A zio monitor was ordered today. It will remain on for 7 days. You will then return monitor and event diary in provided box. It takes 1-2 weeks for report to be downloaded and returned to [place]. We will call you with the results. If monitor falls off or has orange flashing light, please call [employer] for further instructions.     Follow-Up: Follow up with our office as needed   Other Instructions ZIO XT- Long Term Monitor Instructions  Your physician has requested you wear a ZIO patch monitor for 14 days.  This is a single patch monitor. [employer] supplies one patch monitor per enrollment. Additional stickers are not available. Please do not apply patch if you will be having a Nuclear Stress Test,  Echocardiogram, Cardiac CT, MRI, or Chest Xray during the period you would be wearing the  monitor. The patch cannot be worn during these tests. You cannot remove and re-apply the  ZIO XT patch monitor.  Your ZIO patch monitor will be mailed 3 day USPS to your address on file. It may take 3-5 days  to receive your monitor after you have been enrolled.  Once you have received your monitor, please review the enclosed instructions. Your monitor  has already been registered assigning a specific monitor serial # to you.  Billing and Patient Assistance Program  Information  We have supplied [employer] with any of your insurance information on file for billing purposes. [employer] offers a sliding scale Patient Assistance Program for patients that do not have  insurance, or whose insurance does not completely cover the cost of the ZIO monitor.  You must apply for the Patient Assistance Program to qualify for this discounted rate.  To apply, please call [employer] at [phone], select option 4, select option 2, ask to apply for  Patient Assistance Program. Theodore Demark will ask your household income, and how many people  are in your household. They will quote your out-of-pocket cost based on that information.  [employer] will also be able to set up a 46-month, interest-free payment plan if needed.  Applying the monitor   Shave hair from upper left chest.  Hold abrader disc by orange tab. Rub abrader in 40 strokes over the upper left chest as  indicated in your monitor instructions.  Clean area with 4 enclosed alcohol pads. Let dry.  Apply patch as indicated in monitor instructions. Patch will be placed under collarbone on left  side of chest with arrow pointing upward.  Rub patch adhesive wings for 2 minutes. Remove white label marked "1". Remove the white  label marked "2". Rub patch adhesive wings for 2 additional minutes.  While looking in a mirror, press and release button in center of patch. A small green light will  flash 3-4 times. This will be your only indicator that the monitor has been  turned on.  Do not shower for the first 24 hours. You may shower after the first 24 hours.  Press the button if you feel a symptom. You will hear a small click. Record Date, Time and  Symptom in the Patient Logbook.  When you are ready to remove the patch, follow instructions on the last 2 pages of Patient  Logbook. Stick patch monitor onto the last page of Patient Logbook.  Place Patient Logbook in the blue and white box. Use locking tab on box and tape box closed   securely. The blue and white box has prepaid postage on it. Please place it in the mailbox as  soon as possible. Your physician should have your test results approximately 7 days after the  monitor has been mailed back to [facility].  Call [facility] at [phone] if you have questions regarding  your ZIO XT patch monitor. Call them immediately if you see an orange light blinking on your  monitor.  If your monitor falls off in less than 4 days, contact our Monitor department at [phone].  If your monitor becomes loose or falls off after 4 days call [employer] at [phone] for  suggestions on securing your monitor

## 2022-02-26 ENCOUNTER — Telehealth: Payer: Self-pay | Admitting: Cardiology

## 2022-02-26 NOTE — Telephone Encounter (Signed)
New Message:   Patient said she would like for Dr Bettina Gavia to review her notes from her office visit yesterday.  She also would like a recommendation on what Cardiologist she should see in the [facility], based her problems and condition please. She also would like for Dr Bettina Gavia to please her a call when he have time please.

## 2022-02-26 NOTE — Telephone Encounter (Signed)
Called patient back with Dr. Joya Gaskins message below. Patient verbalized understanding.  Charlotte Priest, MD to Me      02/26/22  1:08 PM I am not going to have an opportunity to call her this is not the standard for our practice.   She had a normal left and right heart catheterization   I would recommend Dr. Harriet Masson for future follow-up she is at the [facility] office I think she get along quite well   I looked at the note from yesterday and I do not think she needs any further cardiac testing at this time

## 2022-02-28 NOTE — Telephone Encounter (Signed)
I am not sure what she means.  Her last TSH was normal. Please see me in the office first.  Thank you

## 2022-03-01 ENCOUNTER — Ambulatory Visit (INDEPENDENT_AMBULATORY_CARE_PROVIDER_SITE_OTHER): Payer: Medicare Other | Admitting: Internal Medicine

## 2022-03-01 DIAGNOSIS — R739 Hyperglycemia, unspecified: Secondary | ICD-10-CM | POA: Diagnosis not present

## 2022-03-01 DIAGNOSIS — E559 Vitamin D deficiency, unspecified: Secondary | ICD-10-CM

## 2022-03-01 DIAGNOSIS — E039 Hypothyroidism, unspecified: Secondary | ICD-10-CM | POA: Diagnosis not present

## 2022-03-01 DIAGNOSIS — R3989 Other symptoms and signs involving the genitourinary system: Secondary | ICD-10-CM | POA: Diagnosis not present

## 2022-03-01 DIAGNOSIS — I10 Essential (primary) hypertension: Secondary | ICD-10-CM

## 2022-03-01 LAB — BASIC METABOLIC PANEL
BUN: 21 mg/dL (ref 6–23)
CO2: 25 mEq/L (ref 19–32)
Calcium: 9.7 mg/dL (ref 8.4–10.5)
Chloride: 97 mEq/L (ref 96–112)
Creatinine, Ser: 1.49 mg/dL — ABNORMAL HIGH (ref 0.40–1.20)
GFR: 34.97 mL/min — ABNORMAL LOW (ref >60.00)
Glucose, Bld: 101 mg/dL — ABNORMAL HIGH (ref 70–99)
Potassium: 4.4 mEq/L (ref 3.5–5.1)
Sodium: 131 mEq/L — ABNORMAL LOW (ref 135–145)

## 2022-03-01 LAB — TSH: TSH: 1.12 u[IU]/mL (ref 0.35–5.50)

## 2022-03-01 LAB — VITAMIN D 25 HYDROXY (VIT D DEFICIENCY, FRACTURES): VITD: 68.31 ng/mL (ref 30.00–100.00)

## 2022-03-01 LAB — T4, FREE: Free T4: 0.9 ng/dL (ref 0.60–1.60)

## 2022-03-01 NOTE — Assessment & Plan Note (Signed)
BP Readings from Last 3 Encounters:  03/01/22 (!) 140/100  02/25/22 128/80  02/12/22 (!) 142/98   Mild uncontrolled,, pt to continue medical treatment lisinopril 20 bid

## 2022-03-01 NOTE — Assessment & Plan Note (Signed)
Lab Results  Component Value Date   TSH 1.81 12/15/2021   Stable, pt to continue levothyroxine 100 mcg qd - for f/u TFTs today

## 2022-03-01 NOTE — Assessment & Plan Note (Signed)
Lab Results  Component Value Date   HGBA1C 5.5 06/27/2017   Stable, pt to continue current medical treatment  - diet, wt control
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
## 2022-03-01 NOTE — Assessment & Plan Note (Addendum)
Last vitamin D Lab Results  Component Value Date   VD25OH 39.67 12/15/2021   Low normal at 2000 u qd,, for f/u lab today with PTH level

## 2022-03-01 NOTE — Assessment & Plan Note (Signed)
s/p right inf parathyroidectomy by Dr Kieth Brightly 10/08/21, with recent mild elevated calcium - for PTH and calcium, Vit D levels, refer endo per pt request;  No evidence for acidosis today that pt was reading on the internet

## 2022-03-01 NOTE — Telephone Encounter (Signed)
Notified pt w/MD response. Pt states she is seeing Dr. Jenny Reichmann at 2:00.Marland KitchenJohny Chess

## 2022-03-01 NOTE — Patient Instructions (Signed)
Please continue all other medications as before, and refills have been done if requested.  Please have the pharmacy call with any other refills you may need.  Please continue your efforts at being more active, low cholesterol diet, and weight control.  Please keep your appointments with your specialists as you may have planned  You will be contacted regarding the referral for: Endocrinology  Please go to the LAB at the blood drawing area for the tests to be done  You will be contacted by phone if any changes need to be made immediately.  Otherwise, you will receive a letter about your results with an explanation, but please check with MyChart first.  Please remember to sign up for MyChart if you have not done so, as this will be important to you in the future with finding out test results, communicating by private email, and scheduling acute appointments online when needed.

## 2022-03-02 ENCOUNTER — Other Ambulatory Visit: Payer: Self-pay | Admitting: Internal Medicine

## 2022-03-02 LAB — URINALYSIS, ROUTINE W REFLEX MICROSCOPIC
Bilirubin Urine: NEGATIVE
Hgb urine dipstick: NEGATIVE
Ketones, ur: NEGATIVE
Nitrite: POSITIVE — AB
RBC / HPF: NONE SEEN (ref 0–?)
Specific Gravity, Urine: <=1.005 — AB (ref 1.000–1.030)
Total Protein, Urine: NEGATIVE
Urine Glucose: NEGATIVE
Urobilinogen, UA: 0.2 (ref 0.0–1.0)
pH: 6 (ref 5.0–8.0)

## 2022-03-02 LAB — HEMOGLOBIN A1C: Hgb A1c MFr Bld: 5.8 % (ref 4.6–6.5)

## 2022-03-02 MED ORDER — CEFUROXIME AXETIL 250 MG PO TABS: 250.0000 mg | ORAL_TABLET | Freq: Two times a day (BID) | ORAL | 1 refills | Status: DC

## 2022-03-03 ENCOUNTER — Ambulatory Visit (INDEPENDENT_AMBULATORY_CARE_PROVIDER_SITE_OTHER): Payer: Medicare Other | Admitting: Psychology

## 2022-03-03 DIAGNOSIS — F4323 Adjustment disorder with mixed anxiety and depressed mood: Secondary | ICD-10-CM

## 2022-03-03 LAB — PTH, INTACT AND CALCIUM
Calcium: 9.2 mg/dL (ref 8.6–10.4)
PTH: 85 pg/mL — ABNORMAL HIGH (ref 16–77)

## 2022-03-03 NOTE — Progress Notes (Signed)
Meiko Ives LEWIS Time: 4:10p-5:00 p50 minutes   CHERRIE FRANCA is a 71 y.o. 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 (Status: maintained) -- No Description Entered  Diminished interest in or enjoyment of activities. (Status: maintained) -- No Description Entered  Lack of energy. (Status: maintained) -- No Description Entered  Sad affect, social withdrawal, anxiety, loss of interest in activities, and low energy. (Status: maintained) -- No Description Entered  Medication Status compliance  Safety unspecified If Suicidal or Homicidal State Action Taken: unspecified  Current Risk:  Medications Buspar (Dosage: 30mg  2X/day)  New Medication (Dosage: 250mg  2x/day)  New Medication (Dosage: 250mg  2x/day)  Prozac (Dosage: 40mg  daily)  Remeron (Dosage: 30mg  nightly)  xanax (Dosage: .5mg  2-3X/day)  Objectives Related Problem: Recognize, accept, and cope with feelings of depression. Description: Identify and replace thoughts and beliefs that support depression. Target Date: 2022-04-28 Frequency: Daily Modality: individual Progress: 70%  Related Problem: Recognize, accept, and cope with feelings of depression. Description: Verbalize an understanding of healthy and unhealthy emotions with the intent of increasing the use of healthy emotions to guide actions. Target Date: 2022-04-28 Frequency: Daily Modality: individual Progress: 70%  Related Problem: Recognize, accept, and cope with feelings of depression. Description: Increasingly verbalize hopeful and positive statements regarding self, others, and the future. Target Date: 2022-04-28 Frequency: Daily Modality: individual Progress: 80%  Related Problem: Reduce fear, anxiety, and worry associated with the medical  condition. Description: Share with significant others efforts to adapt successfully to the medical condition/diagnosis. Target Date: 2022-04-28 Frequency: Daily Modality: individual Progress: 80%  Related Problem: Reduce fear, anxiety, and worry associated with the medical condition. Description: Engage in social, productive, and recreational activities that are possible in spite of medical condition. Target Date: 2022-04-28 Frequency: Daily Modality: individual Progress: 70%  Related Problem: Reduce fear, anxiety, and worry associated with the medical condition. Description: Identify the coping skills and sources of emotional support that have been beneficial in the past. Target Date: 2022-04-28 Frequency: Daily Modality: individual Progress: 58%  Client Response full compliance  Service Location Location, [name] [place], [place] [address]  Service Code cpt [phone]  Related past to present  Facilitate problem solving  Identify/label emotions  Validate/empathize  Self care activities  Lifestyle change (exercise, nutrition)  Assess/facilitate readiness to change  Relaxation training  Rationally challenge thoughts or beliefs/cognitive restructuring  Comments  Dx: F43.21  Meds: Prozac, [name], [name], 2021 (completed). She is also wanting to utilize counseling to adjust and accept her latest diagnosis of cancer. She wants assistance to better manage this news and remain positive throughout  treatment. Goal date is 12-22. Patient has made progress, but her medical condition is variable and she requests additional treatment to manage moods throughout her medical ordeal. In  addition, she is trying to manage feelings associated with her older son's mental health struggles. Revised goal date is 12-23.  Patient agrees to have a video session. She is at home and I am in home office.   Gethsemane Fischler is now being monitored by cardiology. She is starting another anti biotic and feels that her treatment team is now on top of her situation. She was very excited to have found a journal that her son [name] had started and written poems. She also found some mother's day cards he had written her in the past. This made her feel very good and was validating to her as a mother. She had been so distressed about his situation and their relationship, and these cards made her feel better about the circumstances. She is, for the first time, making good progress in cleaning out and organizing her home.                                                                                                       Seth Friedlander LEWIS Time: 3:10p-4:00p 50 minutes

## 2022-03-04 ENCOUNTER — Ambulatory Visit: Payer: Medicare Other | Admitting: Skilled Nursing Facility1

## 2022-03-08 ENCOUNTER — Ambulatory Visit: Payer: Medicare Other | Admitting: Psychology

## 2022-03-08 ENCOUNTER — Ambulatory Visit: Payer: Medicare Other | Admitting: Dietician

## 2022-03-08 NOTE — Progress Notes (Incomplete)
[name] Time: 4:10p-5:00 p50 minutes   [name] is a 71 y.o. 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 (Status: maintained) -- No Description Entered  Diminished interest in or enjoyment of activities. (Status: maintained) -- No Description Entered  Lack of energy. (Status: maintained) -- No Description Entered  Sad affect, social withdrawal, anxiety, loss of interest in activities, and low energy. (Status: maintained) -- No Description Entered  Medication Status compliance  Safety unspecified If Suicidal or Homicidal State Action Taken: unspecified  Current Risk:  Medications Buspar (Dosage: 30mg  2X/day)  New Medication (Dosage: 250mg  2x/day)  New Medication (Dosage: 250mg  2x/day)  Prozac (Dosage: 40mg  daily)  Remeron (Dosage: 30mg  nightly)  xanax (Dosage: .5mg  2-3X/day)  Objectives Related Problem: Recognize, accept, and cope with feelings of depression. Description: Identify and replace thoughts and beliefs that support depression. Target Date: 2022-04-28 Frequency: Daily Modality: individual Progress: 70%  Related Problem: Recognize, accept, and cope with feelings of depression. Description: Verbalize an understanding of healthy and unhealthy emotions with the intent of increasing the use of healthy emotions to guide actions. Target Date: 2022-04-28 Frequency: Daily Modality: individual Progress: 70%  Related Problem: Recognize, accept, and cope with feelings of depression. Description: Increasingly verbalize hopeful and positive statements regarding self, others, and the future. Target Date: 2022-04-28 Frequency: Daily Modality: individual Progress: 80%  Related Problem: Reduce fear, anxiety, and worry  associated with the medical condition. Description: Share with significant others efforts to adapt successfully to the medical condition/diagnosis. Target Date: 2022-04-28 Frequency: Daily Modality: individual Progress: 80%  Related Problem: Reduce fear, anxiety, and worry associated with the medical condition. Description: Engage in social, productive, and recreational activities that are possible in spite of medical condition. Target Date: 2022-04-28 Frequency: Daily Modality: individual Progress: 70%  Related Problem: Reduce fear, anxiety, and worry associated with the medical condition. Description: Identify the coping skills and sources of emotional support that have been beneficial in the past. Target Date: 2022-04-28 Frequency: Daily Modality: individual Progress: 58%  Client Response full compliance  Service Location Location, [name] [place], [place] [address]  Service Code cpt [phone]  Related past to present  Facilitate problem solving  Identify/label emotions  Validate/empathize  Self care activities  Lifestyle change (exercise, nutrition)  Assess/facilitate readiness to change  Relaxation training  Rationally challenge thoughts or beliefs/cognitive restructuring  Comments  Dx: F43.21  Meds: Prozac, [name], [name], 2021 (completed). She is also wanting to utilize counseling to adjust and accept her latest diagnosis of cancer. She wants assistance to better manage this news and  remain positive throughout treatment. Goal date is 12-22. Patient has made progress, but her medical condition  is variable and she requests additional treatment to manage moods throughout her medical ordeal. In addition, she is trying to manage feelings associated with her older son's mental health struggles. Revised goal date is 12-23.  Patient agrees to have a video session. She is at home and I am in home office.   [name]                                                                                                        [name] Time: 4:10p-5:00p 50 minutes

## 2022-03-10 ENCOUNTER — Ambulatory Visit: Payer: Medicare Other | Admitting: Cardiology

## 2022-03-10 DIAGNOSIS — H43391 Other vitreous opacities, right eye: Secondary | ICD-10-CM | POA: Diagnosis not present

## 2022-03-10 DIAGNOSIS — H43813 Vitreous degeneration, bilateral: Secondary | ICD-10-CM | POA: Diagnosis not present

## 2022-03-10 DIAGNOSIS — H35371 Puckering of macula, right eye: Secondary | ICD-10-CM | POA: Diagnosis not present

## 2022-03-11 ENCOUNTER — Other Ambulatory Visit: Payer: Self-pay | Admitting: Internal Medicine

## 2022-03-15 ENCOUNTER — Encounter: Payer: Self-pay | Admitting: Internal Medicine

## 2022-03-15 ENCOUNTER — Ambulatory Visit (INDEPENDENT_AMBULATORY_CARE_PROVIDER_SITE_OTHER): Payer: Medicare Other | Admitting: Internal Medicine

## 2022-03-15 DIAGNOSIS — G9332 Myalgic encephalomyelitis/chronic fatigue syndrome: Secondary | ICD-10-CM

## 2022-03-15 DIAGNOSIS — N1832 Chronic kidney disease, stage 3b: Secondary | ICD-10-CM | POA: Diagnosis not present

## 2022-03-15 DIAGNOSIS — E039 Hypothyroidism, unspecified: Secondary | ICD-10-CM

## 2022-03-15 DIAGNOSIS — D351 Benign neoplasm of parathyroid gland: Secondary | ICD-10-CM | POA: Diagnosis not present

## 2022-03-15 MED ORDER — METHYLPHENIDATE HCL 10 MG PO TABS: 10.0000 mg | ORAL_TABLET | Freq: Every morning | ORAL | 0 refills | Status: DC

## 2022-03-15 NOTE — Progress Notes (Signed)
Subjective:  Patient ID: [name], female    DOB: [birth year]  Age: 71 y.o. MRN: [id number]  CC: Follow-up   HPI [name] presents for anxiety, UTIs, depression, CFS  Outpatient Medications Prior to Visit  Medication Sig Dispense Refill  . acetaminophen (TYLENOL) 325 MG tablet Take 2 tablets (650 mg total) by mouth every 6 (six) hours as needed for moderate pain. 90 tablet 2  . ALPRAZolam (XANAX) 0.5 MG tablet Take 1 tablet (0.5 mg total) by mouth 3 (three) times daily as needed for anxiety. 90 tablet 3  . aspirin EC 81 MG tablet Take 1 tablet (81 mg total) by mouth daily. 30 tablet 3  . busPIRone (BUSPAR) 30 MG tablet Take 1 tablet (30 mg total) by mouth 2 (two) times daily. 180 tablet 2  . cefUROXime (CEFTIN) 250 MG tablet Take 1 tablet (250 mg total) by mouth 2 (two) times daily. 20 tablet 1  . Cholecalciferol (D3 5000) 125 MCG (5000 UT) capsule Take 5,000 Units by mouth daily.    . colesevelam (WELCHOL) 625 MG tablet Take 2 tablets (1,250 mg total) by mouth 2 (two) times daily with a meal. 360 tablet 3  . D-Mannose 500 MG CAPS Take 500 mg by mouth 2 (two) times daily. WITH CRANBERRY & DANDELION EXTRACT 180 capsule 3  . diphenoxylate-atropine (LOMOTIL) 2.5-0.025 MG tablet TAKE 1 OR 2 TABLETS BY MOUTH 4 TIMES A DAY AS NEEDED FOR DIARRHEA/LOSSE STOOLS 60 tablet 1  . esomeprazole (NEXIUM) 40 MG capsule Take 1 capsule (40 mg total) by mouth every morning. 90 capsule 1  . FLUoxetine (PROZAC) 40 MG capsule TAKE 1 CAPSULE BY MOUTH IN THE  MORNING 90 capsule 3  . furosemide (LASIX) 20 MG tablet Take 1-2 tablets (20-40 mg total) by mouth daily as needed for edema. TAKE 1 TO 2 TABLETS BY MOUTH  DAILY AS DIRECTED 200 tablet 2  . ipratropium-albuterol (DUONEB) 0.5-2.5 (3) MG/3ML SOLN Take 3 mLs by nebulization every 6 (six) hours as needed (Asthma).    . iron polysaccharides (FERREX 150) 150 MG capsule Take 1 capsule (150 mg total) by mouth 2 (two) times daily. 180 capsule 3   . levothyroxine (SYNTHROID) 100 MCG tablet Take 1 tablet (100 mcg total) by mouth daily before breakfast. 90 tablet 3  . lisinopril (ZESTRIL) 20 MG tablet Take 1 tablet (20 mg total) by mouth in the morning and at bedtime. (Patient taking differently: Take 20 mg by mouth in the morning and at bedtime. 1-2 TABS as needed) 180 tablet 3  . loperamide (IMODIUM A-D) 2 MG tablet Take 2 mg by mouth 4 (four) times daily as needed for diarrhea or loose stools.    Marland Kitchen METAMUCIL FIBER PO Take 1 capsule by mouth in the morning and at bedtime.    . mirtazapine (REMERON) 30 MG tablet Take 1 tablet (30 mg total) by mouth at bedtime. 90 tablet 1  . omega-3 acid ethyl esters (LOVAZA) 1 g capsule Take 2 capsules (2 g total) by mouth 2 (two) times daily. 360 capsule 2  . ondansetron (ZOFRAN-ODT) 4 MG disintegrating tablet TAKE 1 TABLET BY MOUTH EVERY 8 HOURS AS NEEDED FOR NAUSEA AND VOMITING    . OVER THE COUNTER MEDICATION Take 1 capsule by mouth daily. 7 MUSHROOM BLEND    . Polyethyl Glycol-Propyl Glycol (SYSTANE OP) Place 1 drop into both eyes 2 (two) times daily as needed (dry eyes).     . SUNSCREEN SPF30 EX     .  traMADol (ULTRAM) 50 MG tablet Take 1 tablet (50 mg total) by mouth every 6 (six) hours as needed for moderate pain. 100 tablet 1  . vitamin B-12 (CYANOCOBALAMIN) 1000 MCG tablet Take 1,000 mcg by mouth daily.    . vitamin C (ASCORBIC ACID) 500 MG tablet Take 500 mg by mouth daily.    . methylphenidate (RITALIN) 10 MG tablet Take 1 tablet (10 mg total) by mouth in the morning. 90 tablet 0  . albuterol (PROVENTIL) (2.5 MG/3ML) 0.083% nebulizer solution USE 1 VIAL BY NEBULIZATION EVERY 6 (SIX) HOURS AS NEEDED FOR WHEEZING OR SHORTNESS OF BREATH.    Marland Kitchen albuterol (VENTOLIN HFA) 108 (90 Base) MCG/ACT inhaler INHALE 1 2 PUFFS INTO THE LUNGS EVERY 6 (SIX) HOURS AS NEEDED FOR WHEEZING OR SHORTNESS OF BREATH.    . baclofen (LIORESAL) 10 MG tablet Take by mouth.    . erythromycin ophthalmic ointment at bedtime.     . sodium chloride (OCEAN) 0.65 % SOLN nasal spray Place 1 spray into both nostrils as needed for congestion.    . sulfamethoxazole-trimethoprim (BACTRIM DS) 800-160 MG tablet Take 1 tablet by mouth 2 (two) times daily. 10 tablet 0   No facility-administered medications prior to visit.    ROS: Review of Systems  Constitutional:  Positive for fatigue. Negative for activity change, appetite change, chills and unexpected weight change.  HENT:  Negative for congestion, mouth sores and sinus pressure.   Eyes:  Negative for visual disturbance.  Respiratory:  Negative for cough and chest tightness.   Gastrointestinal:  Negative for abdominal pain and nausea.  Genitourinary:  Negative for difficulty urinating, frequency and vaginal pain.  Musculoskeletal:  Positive for arthralgias. Negative for back pain and gait problem.  Skin:  Negative for pallor and rash.  Neurological:  Positive for weakness. Negative for dizziness, tremors, numbness and headaches.  Psychiatric/Behavioral:  Negative for confusion and sleep disturbance.     Objective:  BP 132/86 (BP Location: Left Arm)   Pulse 94   Temp 98.1 F (36.7 C) (Oral)   Ht 5\' 2"  (1.575 m)   Wt 158 lb (71.7 kg)   SpO2 95%   BMI 28.90 kg/m   BP Readings from Last 3 Encounters:  03/15/22 132/86  03/01/22 (!) 140/100  02/25/22 128/80    Wt Readings from Last 3 Encounters:  03/15/22 158 lb (71.7 kg)  03/01/22 157 lb 8 oz (71.4 kg)  02/25/22 157 lb (71.2 kg)    Physical Exam Constitutional:      General: She is not in acute distress.    Appearance: She is well-developed. She is obese.  HENT:     Head: Normocephalic.     Right Ear: External ear normal.     Left Ear: External ear normal.     Nose: Nose normal.  Eyes:     General:        Right eye: No discharge.        Left eye: No discharge.     Conjunctiva/sclera: Conjunctivae normal.     Pupils: Pupils are equal, round, and reactive to light.  Neck:     Thyroid: No  thyromegaly.     Vascular: No JVD.     Trachea: No tracheal deviation.  Cardiovascular:     Rate and Rhythm: Normal rate and regular rhythm.     Heart sounds: Normal heart sounds.  Pulmonary:     Effort: No respiratory distress.     Breath sounds: No stridor. No wheezing.  Abdominal:  General: Bowel sounds are normal. There is no distension.     Palpations: Abdomen is soft. There is no mass.     Tenderness: There is no abdominal tenderness. There is no guarding or rebound.  Musculoskeletal:        General: No tenderness.     Cervical back: Normal range of motion and neck supple. No rigidity.  Lymphadenopathy:     Cervical: No cervical adenopathy.  Skin:    Findings: No erythema or rash.  Neurological:     Cranial Nerves: No cranial nerve deficit.     Motor: No abnormal muscle tone.     Coordination: Coordination normal.     Deep Tendon Reflexes: Reflexes normal.  Psychiatric:        Behavior: Behavior normal.        Thought Content: Thought content normal.        Judgment: Judgment normal.    Lab Results  Component Value Date   WBC 7.8 12/15/2021   HGB 12.2 12/15/2021   HCT 35.7 (L) 12/15/2021   PLT 413.0 (H) 12/15/2021   GLUCOSE 101 (H) 03/01/2022   CHOL 263 (H) 10/18/2018   TRIG 271.0 (H) 10/18/2018   HDL 45.40 10/18/2018   LDLDIRECT 160.0 10/18/2018   LDLCALC UNABLE TO CALCULATE IF TRIGLYCERIDE OVER 400 mg/dL 08/26/2017   ALT 19 12/15/2021   AST 22 12/15/2021   NA 131 (L) 03/01/2022   K 4.4 03/01/2022   CL 97 03/01/2022   CREATININE 1.49 (H) 03/01/2022   BUN 21 03/01/2022   CO2 25 03/01/2022   TSH 1.12 03/01/2022   INR 1.0 07/15/2020   HGBA1C 5.8 03/01/2022   MICROALBUR 0.8 12/16/2021    NM PET Image Restage (PS) Whole Body (F-18 FDG)  Result Date: 02/01/2022 CLINICAL DATA:  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 Leiomyosarcoma in 2013. "Abnormal bony areas on Panorex at dentist office))". EXAM: NUCLEAR MEDICINE PET  WHOLE BODY TECHNIQUE: 7.6 mCi F-18 FDG was injected intravenously. Full-ring PET imaging was performed from the head to foot after the radiotracer. CT data was obtained and used for attenuation correction and anatomic localization. Fasting blood glucose: 97 mg/dl COMPARISON:  08/04/2021 chest abdomen and pelvic CTs. PET 04/28/2020. FINDINGS: Mediastinal blood pool activity: SUV max 3.0 HEAD/NECK: No cervical nodal hypermetabolism. Activity about the anterior oropharynx, slightly eccentric left, favored to be physiologic and within the tongue. No well-defined mass in this area. Incidental CT findings: Cerebral atrophy with resultant prominence of the extra-axial spaces adjacent the frontal lobes. No cervical adenopathy. CHEST: No pulmonary parenchymal or thoracic nodal hypermetabolism. Incidental CT findings: Right apical wedge resection again identified. Aortic and branch vessel atherosclerosis. ABDOMEN/PELVIS: No abdominopelvic parenchymal or nodal hypermetabolism. Incidental CT findings: Normal adrenal glands. Mild renal cortical thinning bilaterally. Punctate lower pole left renal collecting system calculus. Cholecystectomy. Normal noncontrast appearance of the spleen, stomach, pancreas, liver. Air again identified within the urinary bladder. Hysterectomy. Abdominal aortic atherosclerosis. SKELETON: No abnormal marrow activity. Incidental CT findings: Degenerative changes of both hips. Cervical spine fixation. EXTREMITIES: Hypermetabolism about the left plantar fascia and lateral plantar musculature is most likely related to motion after radiopharmaceutical injection. Incidental CT findings: none IMPRESSION: 1. No findings of hypermetabolic recurrent or metastatic disease, status post hysterectomy and right apical wedge resection. 2. Incidental findings, including: Left nephrolithiasis. Coronary artery atherosclerosis. Aortic Atherosclerosis (ICD10-I70.0). Trace air in the urinary bladder. Correlate with  instrumentation. Electronically Signed   By: Abigail Miyamoto M.D.   On: 02/01/2022 12:01  Assessment & Plan:   Problem List Items Addressed This Visit     CKD (chronic kidney disease), stage III ([facility])    F/u w/ Dr Carolin Sicks      Hypercalcemia    Monitor calcium Endo appt is pending      Hypothyroidism    Cont on Synthroid      Parathyroid adenoma    Monitor calcium      Post-COVID chronic fatigue    Worse post-COVID No change lately: Ritalin is helping         Meds ordered this encounter  Medications  . methylphenidate (RITALIN) 10 MG tablet    Sig: Take 1 tablet (10 mg total) by mouth in the morning.    Dispense:  90 tablet    Refill:  0    A 3 months supply      Follow-up: Return in about 3 months (around 06/15/2022) for a follow-up visit.  Walker Kehr, MD

## 2022-03-15 NOTE — Assessment & Plan Note (Signed)
Monitor calcium

## 2022-03-15 NOTE — Assessment & Plan Note (Signed)
F/u w/ Dr Carolin Sicks

## 2022-03-15 NOTE — Assessment & Plan Note (Signed)
Cont on Synthroid

## 2022-03-15 NOTE — Assessment & Plan Note (Signed)
Monitor calcium Endo appt is pending

## 2022-03-15 NOTE — Assessment & Plan Note (Addendum)
Worse post-COVID No change lately: Ritalin is helping

## 2022-03-17 ENCOUNTER — Ambulatory Visit (INDEPENDENT_AMBULATORY_CARE_PROVIDER_SITE_OTHER): Payer: Medicare Other | Admitting: Psychology

## 2022-03-17 DIAGNOSIS — F4323 Adjustment disorder with mixed anxiety and depressed mood: Secondary | ICD-10-CM

## 2022-03-17 NOTE — Progress Notes (Signed)
[name] Time: 4:10p-5:00 p50 minutes   [name] is a 71 y.o. 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 (Status: maintained) -- No Description Entered  Diminished interest in or enjoyment of activities. (Status: maintained) -- No Description Entered  Lack of energy. (Status: maintained) -- No Description Entered  Sad affect, social withdrawal, anxiety, loss of interest in activities, and low energy. (Status: maintained) -- No Description Entered  Medication Status compliance  Safety unspecified If Suicidal or Homicidal State Action Taken: unspecified  Current Risk:  Medications Buspar (Dosage: 30mg  2X/day)  New Medication (Dosage: 250mg  2x/day)  New Medication (Dosage: 250mg  2x/day)  Prozac (Dosage: 40mg  daily)  Remeron (Dosage: 30mg  nightly)  xanax (Dosage: .5mg  2-3X/day)  Objectives Related Problem: Recognize, accept, and cope with feelings of depression. Description: Identify and replace thoughts and beliefs that support depression. Target Date: 2022-04-28 Frequency: Daily Modality: individual Progress: 70%  Related Problem: Recognize, accept, and cope with feelings of depression. Description: Verbalize an understanding of healthy and unhealthy emotions with the intent of increasing the use of healthy emotions to guide actions. Target Date: 2022-04-28 Frequency: Daily Modality: individual Progress: 70%  Related Problem: Recognize, accept, and cope with feelings of depression. Description: Increasingly verbalize hopeful and positive statements regarding self, others, and the future. Target Date: 2022-04-28 Frequency: Daily Modality: individual Progress: 80%  Related Problem: Reduce fear, anxiety, and worry associated with the medical condition. Description: Share with significant others efforts  to adapt successfully to the medical condition/diagnosis. Target Date: 2022-04-28 Frequency: Daily Modality: individual Progress: 80%  Related Problem: Reduce fear, anxiety, and worry associated with the medical condition. Description: Engage in social, productive, and recreational activities that are possible in spite of medical condition. Target Date: 2022-04-28 Frequency: Daily Modality: individual Progress: 70%  Related Problem: Reduce fear, anxiety, and worry associated with the medical condition. Description: Identify the coping skills and sources of emotional support that have been beneficial in the past. Target Date: 2022-04-28 Frequency: Daily Modality: individual Progress: 58%  Client Response full compliance  Service Location Location, [name] [place], [place] [address]  Service Code cpt [phone]  Related past to present  Facilitate problem solving  Identify/label emotions  Validate/empathize  Self care activities  Lifestyle change (exercise, nutrition)  Assess/facilitate readiness to change  Relaxation training  Rationally challenge thoughts or beliefs/cognitive restructuring  Comments  Dx: F43.21  Meds: Prozac, [name], [name], 2021 (completed). She is also wanting to utilize counseling to adjust and accept her latest diagnosis of cancer. She wants assistance to better manage this news and remain positive throughout treatment. Goal date is 12-22. Patient has made progress, but her  medical condition is variable and she requests additional treatment to manage moods throughout her medical ordeal. In addition, she is trying to manage feelings associated with her older son's mental health struggles. Revised goal date is 12-23.  Patient agrees to have a video session. She is  at home and I am in home office.   [name] states that she is doing well. Had an appointment with Dr. Bishop Limbo on Monday. It was a good visit and she is feeling more positive about her medical condition. She was moved by the passing of [name] this past week because it reminded her of her own son [name]. She is working to remain grateful for [place] opportunities to get well. She is trying to stay positive and hopeful. She is feeling more medically stable this week and is making an effort to get out of the house to see and socialize with others.                                                                                                          [name] Time: 3:10p-4:00p 50 minutes

## 2022-03-18 ENCOUNTER — Other Ambulatory Visit: Payer: Self-pay | Admitting: Internal Medicine

## 2022-03-18 ENCOUNTER — Telehealth: Payer: Self-pay | Admitting: Internal Medicine

## 2022-03-18 NOTE — Telephone Encounter (Signed)
Patient called and said that yesterday her BP was 192/111 and when she checked today it is 176/99 and that she also has a dull headache and she is sick on her stomach. I transferred to triage for further eval

## 2022-03-19 ENCOUNTER — Emergency Department (HOSPITAL_COMMUNITY)
Admission: EM | Admit: 2022-03-19 | Discharge: 2022-03-19 | Disposition: A | Discharge status: Home / Self Care | Payer: Medicare Other | Attending: Emergency Medicine | Admitting: Emergency Medicine

## 2022-03-19 ENCOUNTER — Emergency Department (HOSPITAL_COMMUNITY): Payer: Medicare Other

## 2022-03-19 ENCOUNTER — Ambulatory Visit: Payer: Medicare Other | Admitting: Nurse Practitioner

## 2022-03-19 ENCOUNTER — Encounter (HOSPITAL_COMMUNITY): Payer: Self-pay | Admitting: *Deleted

## 2022-03-19 ENCOUNTER — Other Ambulatory Visit: Payer: Self-pay

## 2022-03-19 DIAGNOSIS — I10 Essential (primary) hypertension: Secondary | ICD-10-CM | POA: Insufficient documentation

## 2022-03-19 DIAGNOSIS — R519 Headache, unspecified: Secondary | ICD-10-CM | POA: Diagnosis not present

## 2022-03-19 DIAGNOSIS — I959 Hypotension, unspecified: Secondary | ICD-10-CM | POA: Diagnosis not present

## 2022-03-19 DIAGNOSIS — R0602 Shortness of breath: Secondary | ICD-10-CM | POA: Diagnosis not present

## 2022-03-19 DIAGNOSIS — Z7982 Long term (current) use of aspirin: Secondary | ICD-10-CM | POA: Diagnosis not present

## 2022-03-19 DIAGNOSIS — Z79899 Other long term (current) drug therapy: Secondary | ICD-10-CM | POA: Insufficient documentation

## 2022-03-19 DIAGNOSIS — R079 Chest pain, unspecified: Secondary | ICD-10-CM | POA: Diagnosis not present

## 2022-03-19 LAB — COMPREHENSIVE METABOLIC PANEL
ALT: 23 U/L (ref 0–44)
AST: 28 U/L (ref 15–41)
Albumin: 3.8 g/dL (ref 3.5–5.0)
Alkaline Phosphatase: 99 U/L (ref 38–126)
Anion gap: 10 (ref 5–15)
BUN: 14 mg/dL (ref 8–23)
CO2: 23 mmol/L (ref 22–32)
Calcium: 9.4 mg/dL (ref 8.9–10.3)
Chloride: 104 mmol/L (ref 98–111)
Creatinine, Ser: 1.22 mg/dL — ABNORMAL HIGH (ref 0.44–1.00)
GFR, Estimated: 47 mL/min — ABNORMAL LOW (ref >60)
Glucose, Bld: 81 mg/dL (ref 70–99)
Potassium: 3.8 mmol/L (ref 3.5–5.1)
Sodium: 137 mmol/L (ref 135–145)
Total Bilirubin: 0.7 mg/dL (ref 0.3–1.2)
Total Protein: 6.9 g/dL (ref 6.5–8.1)

## 2022-03-19 LAB — CBC WITH DIFFERENTIAL/PLATELET
Abs Immature Granulocytes: 0.03 10*3/uL (ref 0.00–0.07)
Basophils Absolute: 0.1 10*3/uL (ref 0.0–0.1)
Basophils Relative: 1 %
Eosinophils Absolute: 0.2 10*3/uL (ref 0.0–0.5)
Eosinophils Relative: 3 %
HCT: 36.7 % (ref 36.0–46.0)
Hemoglobin: 12.4 g/dL (ref 12.0–15.0)
Immature Granulocytes: 0 %
Lymphocytes Relative: 25 %
Lymphs Abs: 1.9 10*3/uL (ref 0.7–4.0)
MCH: 31.2 pg (ref 26.0–34.0)
MCHC: 33.8 g/dL (ref 30.0–36.0)
MCV: 92.4 fL (ref 80.0–100.0)
Monocytes Absolute: 0.5 10*3/uL (ref 0.1–1.0)
Monocytes Relative: 7 %
Neutro Abs: 4.8 10*3/uL (ref 1.7–7.7)
Neutrophils Relative %: 64 %
Platelets: 348 10*3/uL (ref 150–400)
RBC: 3.97 MIL/uL (ref 3.87–5.11)
RDW: 13.2 % (ref 11.5–15.5)
WBC: 7.6 10*3/uL (ref 4.0–10.5)
nRBC: 0 % (ref 0.0–0.2)

## 2022-03-19 LAB — TROPONIN I (HIGH SENSITIVITY)
Troponin I (High Sensitivity): 5 ng/L (ref <18)
Troponin I (High Sensitivity): 5 ng/L (ref <18)

## 2022-03-19 MED ORDER — ACETAMINOPHEN 325 MG PO TABS: 650.0000 mg | ORAL_TABLET | Freq: Once | ORAL | Status: DC

## 2022-03-19 MED ORDER — HYDRALAZINE HCL 10 MG PO TABS: 10.0000 mg | ORAL_TABLET | Freq: Four times a day (QID) | ORAL | 0 refills | Status: DC | PRN

## 2022-03-19 MED ORDER — CYCLOBENZAPRINE HCL 10 MG PO TABS: 5.0000 mg | ORAL_TABLET | Freq: Once | ORAL | Status: DC

## 2022-03-19 NOTE — Telephone Encounter (Signed)
Appt w/ Jeralyn Ruths @ 4:40pm../lmb

## 2022-03-19 NOTE — ED Provider Notes (Signed)
[facility] EMERGENCY DEPARTMENT Provider Note   CSN: [id number] Arrival date & time: 03/19/22  1631     History  Chief Complaint  Patient presents with   Hypertension    [name] is a 71 y.o. female.  HPI Patient reports she is very compliant with her medications.  Typically blood pressures are well controlled.  She reports she has been on lisinopril 20 mg twice a day for a very long time.  Over the past couple of evenings her blood pressure elevated quite a bit.  She reports last night she awakened with a pressure headache and did not feel well.  She tried her blood pressure and it was up as high as 200/100.  She reports this is very atypical for her.  She measured several more times over the course of the night and the next day.  Blood pressures did ultimately start trending down.  Patient denies any changes in her diet.  She reports she has felt well recently.  No increased anxiety or situational changes.  She reports when her blood pressure went up at night she had a headache but now all symptoms are resolved.  No chest pain no shortness of breath no syncope no near syncope no visual changes.    Home Medications Prior to Admission medications   Medication Sig Start Date End Date Taking? Authorizing Provider  hydrALAZINE (APRESOLINE) 10 MG tablet Take 1 tablet (10 mg total) by mouth 4 (four) times daily as needed. 03/19/22  Yes Charlesetta Shanks, MD  acetaminophen (TYLENOL) 325 MG tablet Take 2 tablets (650 mg total) by mouth every 6 (six) hours as needed for moderate pain. 07/14/21   Plotnikov, [name], MD  ALPRAZolam Duanne Moron) 0.5 MG tablet Take 1 tablet (0.5 mg total) by mouth 3 (three) times daily as needed for anxiety. 12/04/21   Plotnikov, [name], MD  aspirin EC 81 MG tablet Take 1 tablet (81 mg total) by mouth daily. 07/14/21   Plotnikov, [name], MD  busPIRone (BUSPAR) 30 MG tablet Take 1 tablet (30 mg total) by mouth 2 (two) times daily. 09/27/21    Plotnikov, [name], MD  cefUROXime (CEFTIN) 250 MG tablet Take 1 tablet (250 mg total) by mouth 2 (two) times daily. 03/02/22   Plotnikov, [name], MD  Cholecalciferol (D3 5000) 125 MCG (5000 UT) capsule Take 5,000 Units by mouth daily.    [provider]  colesevelam (WELCHOL) 625 MG tablet Take 2 tablets (1,250 mg total) by mouth 2 (two) times daily with a meal. 09/27/21 09/27/22  Plotnikov, [name], MD  D-Mannose 500 MG CAPS Take 500 mg by mouth 2 (two) times daily. WITH CRANBERRY & DANDELION EXTRACT 09/27/21   Plotnikov, [name], MD  diphenoxylate-atropine (LOMOTIL) 2.5-0.025 MG tablet TAKE 1 OR 2 TABLETS BY MOUTH 4 TIMES A DAY AS NEEDED FOR DIARRHEA/LOSSE STOOLS 02/25/22   Plotnikov, [name], MD  esomeprazole (NEXIUM) 40 MG capsule TAKE 1 CAPSULE (40 MG TOTAL) BY MOUTH EVERY MORNING. 03/18/22   Plotnikov, [name], MD  FLUoxetine (PROZAC) 40 MG capsule TAKE 1 CAPSULE BY MOUTH IN THE  MORNING 03/15/22   Plotnikov, [name], MD  furosemide (LASIX) 20 MG tablet Take 1-2 tablets (20-40 mg total) by mouth daily as needed for edema. TAKE 1 TO 2 TABLETS BY MOUTH  DAILY AS DIRECTED 09/27/21   Plotnikov, [name], MD  ipratropium-albuterol (DUONEB) 0.5-2.5 (3) MG/3ML SOLN Take 3 mLs by nebulization every 6 (six) hours as needed (  Asthma).    [provider]  iron polysaccharides (FERREX 150) 150 MG capsule Take 1 capsule (150 mg total) by mouth 2 (two) times daily. 09/27/21   Plotnikov, [name], MD  levothyroxine (SYNTHROID) 100 MCG tablet Take 1 tablet (100 mcg total) by mouth daily before breakfast. 09/27/21   Plotnikov, [name], MD  lisinopril (ZESTRIL) 20 MG tablet Take 1 tablet (20 mg total) by mouth in the morning and at bedtime. Patient taking differently: Take 20 mg by mouth in the morning and at bedtime. 1-2 TABS as needed 12/04/21   Plotnikov, [name], MD  loperamide (IMODIUM A-D) 2 MG tablet Take 2 mg by mouth 4 (four) times daily as needed for diarrhea or loose  stools.    [provider]  METAMUCIL FIBER PO Take 1 capsule by mouth in the morning and at bedtime.    [provider]  methylphenidate (RITALIN) 10 MG tablet Take 1 tablet (10 mg total) by mouth in the morning. 03/15/22   Plotnikov, [name], MD  mirtazapine (REMERON) 30 MG tablet Take 1 tablet (30 mg total) by mouth at bedtime. 09/27/21   Plotnikov, [name], MD  omega-3 acid ethyl esters (LOVAZA) 1 g capsule TAKE 2 CAPSULES BY MOUTH 2 TIMES DAILY. 03/18/22   Plotnikov, [name], MD  ondansetron (ZOFRAN-ODT) 4 MG disintegrating tablet TAKE 1 TABLET BY MOUTH EVERY 8 HOURS AS NEEDED FOR NAUSEA AND VOMITING    [provider]  OVER THE COUNTER MEDICATION Take 1 capsule by mouth daily. 7 MUSHROOM BLEND    [provider]  Polyethyl Glycol-Propyl Glycol (SYSTANE OP) Place 1 drop into both eyes 2 (two) times daily as needed (dry eyes).     [provider]  SUNSCREEN SPF30 EX     [provider]  traMADol (ULTRAM) 50 MG tablet Take 1 tablet (50 mg total) by mouth every 6 (six) hours as needed for moderate pain. 01/28/22   Plotnikov, [name], MD  vitamin B-12 (CYANOCOBALAMIN) 1000 MCG tablet Take 1,000 mcg by mouth daily.    [provider]  vitamin C (ASCORBIC ACID) 500 MG tablet Take 500 mg by mouth daily.    [provider]  famotidine (PEPCID) 40 MG tablet TAKE 1 TABLET BY MOUTH EVERY DAY IN THE EVENING Patient not taking: No sig reported 04/03/19 11/05/19  Kozlow, Donnamarie Poag, MD      Allergies    Buprenorphine hcl, Morphine and related, Amlodipine, Buprenorphine, Cephalexin, Other, Sumatriptan, Codeine, Dust mite extract, and Molds & smuts    Review of Systems   Review of Systems  Physical Exam Updated Vital Signs BP 121/83   Pulse 72   Temp 98.6 F (37 C) (Oral)   Resp 17   Ht 5\' 2"  (1.575 m)   Wt 71.7 kg   SpO2 95%   BMI 28.91 kg/m  Physical Exam Constitutional:      Comments: Alert nontoxic clinically well in  appearance.  HENT:     Head: Normocephalic and atraumatic.     Mouth/Throat:     Pharynx: Oropharynx is clear.  Eyes:     Extraocular Movements: Extraocular movements intact.  Cardiovascular:     Rate and Rhythm: Normal rate and regular rhythm.  Pulmonary:     Effort: Pulmonary effort is normal.     Breath sounds: Normal breath sounds.  Abdominal:     General: There is no distension.     Palpations: Abdomen is soft.     Tenderness:  There is no abdominal tenderness. There is no guarding.  Musculoskeletal:     Cervical back: Neck supple.     ED Results / Procedures / Treatments   Labs (all labs ordered are listed, but only abnormal results are displayed) Labs Reviewed  COMPREHENSIVE METABOLIC PANEL - Abnormal; Notable for the following components:      Result Value   Creatinine, Ser 1.22 (*)    GFR, Estimated 47 (*)    All other components within normal limits  CBC WITH DIFFERENTIAL/PLATELET  PARATHYROID HORMONE, INTACT (NO CA)  TROPONIN I (HIGH SENSITIVITY)  TROPONIN I (HIGH SENSITIVITY)    EKG EKG Interpretation  Date/Time:  Friday March 19 2022 18:01:43 EDT Ventricular Rate:  65 PR Interval:  180 QRS Duration: 80 QT Interval:  406 QTC Calculation: 422 R Axis:   83 Text Interpretation: Normal sinus rhythm Normal ECG When compared with ECG of 15-Jul-2020 13:51, PREVIOUS ECG IS PRESENT normal,no sig change from previous Confirmed by Charlesetta Shanks [id number]) on 03/19/2022 8:56:59 PM  Radiology DG Chest 2 View  Result Date: 03/19/2022 CLINICAL DATA:  Shortness of breath, hypertension EXAM: CHEST - 2 VIEW COMPARISON:  10/28/2020 FINDINGS: Cardiac size is within normal limits. Flattening of diaphragms may suggest COPD. There are no signs of pulmonary edema or focal pulmonary consolidation. Blunting of right lateral CP angle has not changed suggesting pleural thickening. Small linear densities in lateral aspect of left lower lung fields may suggest scarring. Surgical  staples and linear densities in the right upper lung fields appear stable. There is previous surgical fusion in cervical spine. IMPRESSION: There are no new infiltrates or signs of pulmonary edema. Electronically Signed   By: Elmer Picker M.D.   On: 03/19/2022 20:17   CT Head Wo Contrast  Result Date: 03/19/2022 CLINICAL DATA:  Headache, new or worsening (Age >= 50y) EXAM: CT HEAD WITHOUT CONTRAST TECHNIQUE: Contiguous axial images were obtained from the base of the skull through the vertex without intravenous contrast. RADIATION DOSE REDUCTION: This exam was performed according to the departmental dose-optimization program which includes automated exposure control, adjustment of the mA and/or kV according to patient size and/or use of iterative reconstruction technique. COMPARISON:  08/04/2021 FINDINGS: Brain: No evidence of acute infarction, hemorrhage, hydrocephalus, extra-axial collection or mass lesion/mass effect. Similar degree of cerebral volume loss. Vascular: No hyperdense vessel or unexpected calcification. Skull: Normal. Negative for fracture or focal lesion. Sinuses/Orbits: No acute finding. Other: None. IMPRESSION: No acute intracranial abnormality. Electronically Signed   By: Davina Poke D.O.   On: 03/19/2022 19:58    Procedures Procedures    Medications Ordered in ED Medications  cyclobenzaprine (FLEXERIL) tablet 5 mg (has no administration in time range)  acetaminophen (TYLENOL) tablet 650 mg (has no administration in time range)    ED Course/ Medical Decision Making/ A&P                           Medical Decision Making Risk Prescription drug management.   Patient had an episode of significant elevated blood pressure starting overnight.  She describes significant headache at that time.  Blood pressure has normalized by the time of my evaluation.  Patient's diagnostic evaluation initiated in triage with CT head, lab work and chest x-ray.  Review of CT head no  acute findings by radiology interpretation.  Chest x-ray clear without acute abnormality.  Lab work within normal limits.  Patient's mental status is clear.  Neurologic exam is normal.  Blood pressures have been normotensive since arrival to the emergency department.  At this time exact etiology of symptoms unclear.  Patient describes compliance with medications and no significant lifestyle changes recently.  Will prescribe hydralazine on as needed basis for blood pressure spikes.  Recommend close follow-up and monitoring with PCP.  If patient is having episodic blood pressure elevation without other etiology may need referral to nephrology or endocrinology.  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  PTH has been obtained but will be pending.  At this time with all vital signs normalized and patient asymptomatic, will plan for discharge with continued close monitoring at home.        Final Clinical Impression(s) / ED Diagnoses Final diagnoses:  Hypertension, unspecified type    Rx / DC Orders ED Discharge Orders          Ordered    hydrALAZINE (APRESOLINE) 10 MG tablet  4 times daily PRN        03/19/22 2213              Charlesetta Shanks, MD 03/19/22 2357

## 2022-03-19 NOTE — Telephone Encounter (Signed)
TRANSFERRED CALL to [facility] for elevated BP 189/111

## 2022-03-19 NOTE — Discharge Instructions (Signed)
1.  Your blood pressure is normal in the emergency department.  Sometimes there is not a good explanation for isolated blood pressure spikes.  It is very important you follow-up with your family doctor for further evaluation of this condition.  You may need referral to another specialist such as a nephrologist or endocrinologist if this continues. 2.  You have been given a prescription for hydralazine.  This is a medication that you can take if her blood pressure elevates.  You do not need to take it continuously because right now your blood pressure is normal.  You have been given hydralazine to start at a very low dose.  You may take 10 mg up to every 6 hours as needed.  Hydralazine comes and doses as high as 100 mg.  If your blood pressures are elevated and not responding thing to 10 mg, you may take 20 mg. 3.  If your blood pressure goes high and you have visual problems, headache, confusion or other concerning changes, return to emergency part immediately for recheck.

## 2022-03-19 NOTE — ED Triage Notes (Signed)
Headache for 3 days  with an elevated bp heart rapid and some sob

## 2022-03-19 NOTE — ED Provider Triage Note (Signed)
Emergency Medicine Provider Triage Evaluation Note  ALAA MULLALLY , a 71 y.o. female  was evaluated in triage.  Pt complains of elevated BP for the last few days. Reports hx of kidney disease. Reports hx of elevated PTH and that this has caused elevated BP before. SBP has been 140s-180s. Has been taking lisinopril as prescribed. Reports increased headache and shortness of breath. Reports blurred visions w/headache. No weakness on one side of body, or difficulty with speech.   Review of Systems  Positive: headache, SOB Negative: Chest pain   Physical Exam  BP (!) 134/93 (BP Location: Right Arm)   Pulse 81   Temp 98.6 F (37 C) (Oral)   Resp 19   SpO2 100%  Gen:   Awake, no distress   Resp:  Normal effort  MSK:   Moves extremities without difficulty Other:  No neurodeficits  Medical Decision Making  Medically screening exam initiated at 6:01 PM.  Appropriate orders placed.  [name]-Marks was informed that the remainder of the evaluation will be completed by another provider, this initial triage assessment does not replace that evaluation, and the importance of remaining in the ED until their evaluation is complete.     Osvaldo Shipper, [place] 03/19/22 1859

## 2022-03-19 NOTE — ED Notes (Signed)
The pt arrived by [facility] from home headache for 3 days bp up and down  bp now 140/80  no headache

## 2022-03-19 NOTE — ED Notes (Signed)
Pt in lobby expressing some anxiety to nurse tech about high blood pressure and chest pain. Triage Nurse notified.

## 2022-03-21 ENCOUNTER — Other Ambulatory Visit: Payer: Self-pay | Admitting: Internal Medicine

## 2022-03-22 ENCOUNTER — Ambulatory Visit (INDEPENDENT_AMBULATORY_CARE_PROVIDER_SITE_OTHER): Payer: Medicare Other | Admitting: Psychology

## 2022-03-22 DIAGNOSIS — F4323 Adjustment disorder with mixed anxiety and depressed mood: Secondary | ICD-10-CM

## 2022-03-22 LAB — PARATHYROID HORMONE, INTACT (NO CA): PTH: 35 pg/mL (ref 15–65)

## 2022-03-22 NOTE — Progress Notes (Signed)
Kaynan Klonowski LEWIS Time: 4:10p-5:00 p50 minutes   [name] is a 71 y.o. 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 (Status: maintained) -- No Description Entered  Diminished interest in or enjoyment of activities. (Status: maintained) -- No Description Entered  Lack of energy. (Status: maintained) -- No Description Entered  Sad affect, social withdrawal, anxiety, loss of interest in activities, and low energy. (Status: maintained) -- No Description Entered  Medication Status compliance  Safety unspecified If Suicidal or Homicidal State Action Taken: unspecified  Current Risk:  Medications Buspar (Dosage: 30mg  2X/day)  New Medication (Dosage: 250mg  2x/day)  New Medication (Dosage: 250mg  2x/day)  Prozac (Dosage: 40mg  daily)  Remeron (Dosage: 30mg  nightly)  xanax (Dosage: .5mg  2-3X/day)  Objectives Related Problem: Recognize, accept, and cope with feelings of depression. Description: Identify and replace thoughts and beliefs that support depression. Target Date: 2022-04-28 Frequency: Daily Modality: individual Progress: 70%  Related Problem: Recognize, accept, and cope with feelings of depression. Description: Verbalize an understanding of healthy and unhealthy emotions with the intent of increasing the use of healthy emotions to guide actions. Target Date: 2022-04-28 Frequency: Daily Modality: individual Progress: 70%  Related Problem: Recognize, accept, and cope with feelings of depression. Description: Increasingly verbalize hopeful and positive statements regarding self, others, and the future. Target Date: 2022-04-28 Frequency: Daily Modality: individual Progress: 80%  Related Problem: Reduce fear, anxiety, and worry associated with the medical condition. Description: Share with significant others efforts  to adapt successfully to the medical condition/diagnosis. Target Date: 2022-04-28 Frequency: Daily Modality: individual Progress: 80%  Related Problem: Reduce fear, anxiety, and worry associated with the medical condition. Description: Engage in social, productive, and recreational activities that are possible in spite of medical condition. Target Date: 2022-04-28 Frequency: Daily Modality: individual Progress: 70%  Related Problem: Reduce fear, anxiety, and worry associated with the medical condition. Description: Identify the coping skills and sources of emotional support that have been beneficial in the past. Target Date: 2022-04-28 Frequency: Daily Modality: individual Progress: 58%  Client Response full compliance  Service Location Location, [name] [place], [place] [address]  Service Code cpt [phone]  Related past to present  Facilitate problem solving  Identify/label emotions  Validate/empathize  Self care activities  Lifestyle change (exercise, nutrition)  Assess/facilitate readiness to change  Relaxation training  Rationally challenge thoughts or beliefs/cognitive restructuring  Comments  Dx: F43.21  Meds: Prozac, [name], [name], 2021 (completed). She is also wanting to utilize counseling to adjust and accept her latest diagnosis of cancer. She wants assistance to better manage this news and remain positive throughout treatment. Goal date is 12-22. Patient has made progress, but her  medical condition is variable and she requests additional treatment to manage moods throughout her medical ordeal. In addition, she is trying to manage feelings associated with her older son's mental health struggles. Revised goal date is 12-23.  Patient agrees to have a video session. She is  at home and I am in home office.   [name] says that she had issues with her blood pressure last week. It was running very high and she had trouble getting it down and keeping it down. She ended up in the ER from 2pm tp 10pm. Could never determine if the high blood pressure was related to any other of her known medical conditions. Will follow-up with her primary care doctor. She continues to maintain a positive attitude and is optimistic. Anxiety and moods are currently stable.                                                                                                              Tyren Dugar LEWIS Time: 4:10p-5:00p 50 minutes

## 2022-03-25 ENCOUNTER — Encounter: Payer: Self-pay | Admitting: Internal Medicine

## 2022-03-25 ENCOUNTER — Ambulatory Visit (INDEPENDENT_AMBULATORY_CARE_PROVIDER_SITE_OTHER): Payer: Medicare Other | Admitting: Internal Medicine

## 2022-03-25 VITALS — BP 138/90 | HR 64 | Temp 97.5°F | Ht 62.0 in | Wt 156.2 lb

## 2022-03-25 DIAGNOSIS — N1832 Chronic kidney disease, stage 3b: Secondary | ICD-10-CM

## 2022-03-25 DIAGNOSIS — F419 Anxiety disorder, unspecified: Secondary | ICD-10-CM | POA: Diagnosis not present

## 2022-03-25 DIAGNOSIS — I10 Essential (primary) hypertension: Secondary | ICD-10-CM | POA: Diagnosis not present

## 2022-03-25 MED ORDER — ALPRAZOLAM 1 MG PO TABS: 1.0000 mg | ORAL_TABLET | Freq: Three times a day (TID) | ORAL | 5 refills | Status: DC | PRN

## 2022-03-25 MED ORDER — NEBIVOLOL HCL 10 MG PO TABS: 10.0000 mg | ORAL_TABLET | Freq: Every day | ORAL | 3 refills | Status: DC

## 2022-03-25 MED ORDER — HYDRALAZINE HCL 10 MG PO TABS: 10.0000 mg | ORAL_TABLET | Freq: Four times a day (QID) | ORAL | 3 refills | Status: DC | PRN

## 2022-03-25 NOTE — Assessment & Plan Note (Addendum)
Worse: s/p ER visit for BP 200/100 and a HA. Head CT was ok Given Hydralazine - taking 10 mg qid to keep BP down Anxiety is worse - treat anxiety Re-start Bystolic

## 2022-03-25 NOTE — Assessment & Plan Note (Signed)
Xanax as needed - increase dose due to tolerance  Potential benefits of a long term benzodiazepines  use as well as potential risks  and complications were explained to the patient and were aknowledged.

## 2022-03-25 NOTE — Progress Notes (Signed)
Subjective:  Patient ID: Vedia Coffer, female    DOB: 02-Apr-1951  Age: 71 y.o. MRN: [id number]  CC: Follow-up (ER F/U on BP)   HPI Vedia Coffer presents for ER visit for BP 200/100 and a HA. Head CT was ok Given Hydralazine - taking 10 mg qid to keep BP down Anxiety is worse  Outpatient Medications Prior to Visit  Medication Sig Dispense Refill   acetaminophen (TYLENOL) 325 MG tablet Take 2 tablets (650 mg total) by mouth every 6 (six) hours as needed for moderate pain. 90 tablet 2   aspirin EC 81 MG tablet Take 1 tablet (81 mg total) by mouth daily. 30 tablet 3   busPIRone (BUSPAR) 30 MG tablet Take 1 tablet (30 mg total) by mouth 2 (two) times daily. 180 tablet 2   Cholecalciferol (D3 5000) 125 MCG (5000 UT) capsule Take 5,000 Units by mouth daily.     colesevelam (WELCHOL) 625 MG tablet Take 2 tablets (1,250 mg total) by mouth 2 (two) times daily with a meal. 360 tablet 3   D-Mannose 500 MG CAPS Take 500 mg by mouth 2 (two) times daily. WITH CRANBERRY & DANDELION EXTRACT 180 capsule 3   diphenoxylate-atropine (LOMOTIL) 2.5-0.025 MG tablet TAKE 1 OR 2 TABLETS BY MOUTH 4 TIMES A DAY AS NEEDED FOR DIARRHEA/LOSSE STOOLS 60 tablet 1   esomeprazole (NEXIUM) 40 MG capsule TAKE 1 CAPSULE (40 MG TOTAL) BY MOUTH EVERY MORNING. 90 capsule 1   FLUoxetine (PROZAC) 40 MG capsule TAKE 1 CAPSULE BY MOUTH IN THE  MORNING 90 capsule 3   furosemide (LASIX) 20 MG tablet Take 1-2 tablets (20-40 mg total) by mouth daily as needed for edema. TAKE 1 TO 2 TABLETS BY MOUTH  DAILY AS DIRECTED 200 tablet 2   ipratropium-albuterol (DUONEB) 0.5-2.5 (3) MG/3ML SOLN Take 3 mLs by nebulization every 6 (six) hours as needed (Asthma).     iron polysaccharides (FERREX 150) 150 MG capsule Take 1 capsule (150 mg total) by mouth 2 (two) times daily. 180 capsule 3   levothyroxine (SYNTHROID) 100 MCG tablet Take 1 tablet (100 mcg total) by mouth daily before breakfast. 90 tablet 3   lisinopril (ZESTRIL) 20 MG  tablet Take 1 tablet (20 mg total) by mouth in the morning and at bedtime. (Patient taking differently: Take 20 mg by mouth in the morning and at bedtime. 1-2 TABS as needed) 180 tablet 3   loperamide (IMODIUM A-D) 2 MG tablet Take 2 mg by mouth 4 (four) times daily as needed for diarrhea or loose stools.     METAMUCIL FIBER PO Take 1 capsule by mouth in the morning and at bedtime.     methylphenidate (RITALIN) 10 MG tablet Take 1 tablet (10 mg total) by mouth in the morning. 90 tablet 0   mirtazapine (REMERON) 30 MG tablet TAKE 1 TABLET BY MOUTH AT  BEDTIME 90 tablet 3   omega-3 acid ethyl esters (LOVAZA) 1 g capsule TAKE 2 CAPSULES BY MOUTH 2 TIMES DAILY. 360 capsule 1   ondansetron (ZOFRAN-ODT) 4 MG disintegrating tablet TAKE 1 TABLET BY MOUTH EVERY 8 HOURS AS NEEDED FOR NAUSEA AND VOMITING     OVER THE COUNTER MEDICATION Take 1 capsule by mouth daily. 7 MUSHROOM BLEND     Polyethyl Glycol-Propyl Glycol (SYSTANE OP) Place 1 drop into both eyes 2 (two) times daily as needed (dry eyes).      SUNSCREEN SPF30 EX      traMADol (ULTRAM) 50 MG tablet Take  1 tablet (50 mg total) by mouth every 6 (six) hours as needed for moderate pain. 100 tablet 1   vitamin B-12 (CYANOCOBALAMIN) 1000 MCG tablet Take 1,000 mcg by mouth daily.     vitamin C (ASCORBIC ACID) 500 MG tablet Take 500 mg by mouth daily.     ALPRAZolam (XANAX) 0.5 MG tablet Take 1 tablet (0.5 mg total) by mouth 3 (three) times daily as needed for anxiety. 90 tablet 3   hydrALAZINE (APRESOLINE) 10 MG tablet Take 1 tablet (10 mg total) by mouth 4 (four) times daily as needed. 30 tablet 0   cefUROXime (CEFTIN) 250 MG tablet Take 1 tablet (250 mg total) by mouth 2 (two) times daily. (Patient not taking: Reported on 03/25/2022) 20 tablet 1   No facility-administered medications prior to visit.    ROS: Review of Systems  Constitutional:  Negative for activity change, appetite change, chills, fatigue and unexpected weight change.  HENT:  Negative  for congestion, mouth sores and sinus pressure.   Eyes:  Negative for visual disturbance.  Respiratory:  Negative for cough and chest tightness.   Gastrointestinal:  Negative for abdominal pain and nausea.  Genitourinary:  Negative for difficulty urinating, frequency and vaginal pain.  Musculoskeletal:  Negative for back pain and gait problem.  Skin:  Negative for pallor and rash.  Neurological:  Positive for headaches. Negative for dizziness, tremors, weakness and numbness.  Psychiatric/Behavioral:  Negative for confusion, sleep disturbance and suicidal ideas. The patient is nervous/anxious.     Objective:  BP (!) 138/90 (BP Location: Left Arm)   Pulse 64   Temp (!) 97.5 F (36.4 C) (Oral)   Ht 5\' 2"  (1.575 m)   Wt 156 lb 3.2 oz (70.9 kg)   SpO2 98%   BMI 28.57 kg/m   BP Readings from Last 3 Encounters:  03/25/22 (!) 138/90  03/19/22 121/83  03/15/22 132/86    Wt Readings from Last 3 Encounters:  03/25/22 156 lb 3.2 oz (70.9 kg)  03/19/22 158 lb 1.1 oz (71.7 kg)  03/15/22 158 lb (71.7 kg)    Physical Exam Constitutional:      General: She is not in acute distress.    Appearance: Normal appearance. She is well-developed.  HENT:     Head: Normocephalic.     Right Ear: External ear normal.     Left Ear: External ear normal.     Nose: Nose normal.  Eyes:     General:        Right eye: No discharge.        Left eye: No discharge.     Conjunctiva/sclera: Conjunctivae normal.     Pupils: Pupils are equal, round, and reactive to light.  Neck:     Thyroid: No thyromegaly.     Vascular: No JVD.     Trachea: No tracheal deviation.  Cardiovascular:     Rate and Rhythm: Normal rate and regular rhythm.     Heart sounds: Normal heart sounds.  Pulmonary:     Effort: No respiratory distress.     Breath sounds: No stridor. No wheezing.  Abdominal:     General: Bowel sounds are normal. There is no distension.     Palpations: Abdomen is soft. There is no mass.      Tenderness: There is no abdominal tenderness. There is no guarding or rebound.  Musculoskeletal:        General: No tenderness.     Cervical back: Normal range of motion and neck  supple. No rigidity.  Lymphadenopathy:     Cervical: No cervical adenopathy.  Skin:    Findings: No erythema or rash.  Neurological:     Cranial Nerves: No cranial nerve deficit.     Motor: No abnormal muscle tone.     Coordination: Coordination normal.     Deep Tendon Reflexes: Reflexes normal.  Psychiatric:        Behavior: Behavior normal.        Thought Content: Thought content normal.        Judgment: Judgment normal.     Lab Results  Component Value Date   WBC 7.6 03/19/2022   HGB 12.4 03/19/2022   HCT 36.7 03/19/2022   PLT 348 03/19/2022   GLUCOSE 81 03/19/2022   CHOL 263 (H) 10/18/2018   TRIG 271.0 (H) 10/18/2018   HDL 45.40 10/18/2018   LDLDIRECT 160.0 10/18/2018   LDLCALC UNABLE TO CALCULATE IF TRIGLYCERIDE OVER 400 mg/dL 08/26/2017   ALT 23 03/19/2022   AST 28 03/19/2022   NA 137 03/19/2022   K 3.8 03/19/2022   CL 104 03/19/2022   CREATININE 1.22 (H) 03/19/2022   BUN 14 03/19/2022   CO2 23 03/19/2022   TSH 1.12 03/01/2022   INR 1.0 07/15/2020   HGBA1C 5.8 03/01/2022   MICROALBUR 0.8 12/16/2021    DG Chest 2 View  Result Date: 03/19/2022 CLINICAL DATA:  Shortness of breath, hypertension EXAM: CHEST - 2 VIEW COMPARISON:  10/28/2020 FINDINGS: Cardiac size is within normal limits. Flattening of diaphragms may suggest COPD. There are no signs of pulmonary edema or focal pulmonary consolidation. Blunting of right lateral CP angle has not changed suggesting pleural thickening. Small linear densities in lateral aspect of left lower lung fields may suggest scarring. Surgical staples and linear densities in the right upper lung fields appear stable. There is previous surgical fusion in cervical spine. IMPRESSION: There are no new infiltrates or signs of pulmonary edema. Electronically Signed    By: Elmer Picker M.D.   On: 03/19/2022 20:17   CT Head Wo Contrast  Result Date: 03/19/2022 CLINICAL DATA:  Headache, new or worsening (Age >= 50y) EXAM: CT HEAD WITHOUT CONTRAST TECHNIQUE: Contiguous axial images were obtained from the base of the skull through the vertex without intravenous contrast. RADIATION DOSE REDUCTION: This exam was performed according to the departmental dose-optimization program which includes automated exposure control, adjustment of the mA and/or kV according to patient size and/or use of iterative reconstruction technique. COMPARISON:  08/04/2021 FINDINGS: Brain: No evidence of acute infarction, hemorrhage, hydrocephalus, extra-axial collection or mass lesion/mass effect. Similar degree of cerebral volume loss. Vascular: No hyperdense vessel or unexpected calcification. Skull: Normal. Negative for fracture or focal lesion. Sinuses/Orbits: No acute finding. Other: None. IMPRESSION: No acute intracranial abnormality. Electronically Signed   By: Davina Poke D.O.   On: 03/19/2022 19:58    Assessment & Plan:   Problem List Items Addressed This Visit     Anxiety    Xanax as needed - increase dose due to tolerance  Potential benefits of a long term benzodiazepines  use as well as potential risks  and complications were explained to the patient and were aknowledged.      Relevant Medications   ALPRAZolam (XANAX) 1 MG tablet   HTN (hypertension)    Worse: s/p ER visit for BP 200/100 and a HA. Head CT was ok Given Hydralazine - taking 10 mg qid to keep BP down Anxiety is worse - treat anxiety Re-start Bystolic  Relevant Medications   hydrALAZINE (APRESOLINE) 10 MG tablet   nebivolol (BYSTOLIC) 10 MG tablet   CKD (chronic kidney disease), stage III (HCC) - Primary    Monitor BP         Meds ordered this encounter  Medications   ALPRAZolam (XANAX) 1 MG tablet    Sig: Take 1 tablet (1 mg total) by mouth 3 (three) times daily as needed for  anxiety.    Dispense:  90 tablet    Refill:  5   hydrALAZINE (APRESOLINE) 10 MG tablet    Sig: Take 1 tablet (10 mg total) by mouth 4 (four) times daily as needed.    Dispense:  100 tablet    Refill:  3   nebivolol (BYSTOLIC) 10 MG tablet    Sig: Take 1 tablet (10 mg total) by mouth daily.    Dispense:  90 tablet    Refill:  3      Follow-up: No follow-ups on file.  Walker Kehr, MD

## 2022-03-25 NOTE — Assessment & Plan Note (Signed)
Monitor BP 

## 2022-03-31 ENCOUNTER — Ambulatory Visit: Payer: Medicare Other | Admitting: Psychology

## 2022-03-31 ENCOUNTER — Encounter: Payer: Self-pay | Admitting: Cardiology

## 2022-03-31 ENCOUNTER — Ambulatory Visit: Payer: Medicare Other | Attending: Cardiology | Admitting: Cardiology

## 2022-03-31 VITALS — BP 136/78 | HR 58 | Ht 62.0 in | Wt 158.0 lb

## 2022-03-31 DIAGNOSIS — I491 Atrial premature depolarization: Secondary | ICD-10-CM | POA: Diagnosis not present

## 2022-03-31 DIAGNOSIS — I10 Essential (primary) hypertension: Secondary | ICD-10-CM | POA: Diagnosis not present

## 2022-03-31 NOTE — Patient Instructions (Addendum)
Medication Instructions:  Your physician recommends that you continue on your current medications as directed. Please refer to the Current Medication list given to you today.  *If you need a refill on your cardiac medications before your next appointment, please call your pharmacy*   Lab Work: NONE If you have labs (blood work) drawn today and your tests are completely normal, you will receive your results only by: [facility] (if you have MyChart) OR A paper copy in the mail If you have any lab test that is abnormal or we need to change your treatment, we will call you to review the results.   Testing/Procedures: NONE   Follow-Up: At [facility], you and your health needs are our priority.  As part of our continuing mission to provide you with exceptional heart care, we have created designated Provider Care Teams.  These Care Teams include your primary Cardiologist (physician) and Advanced Practice Providers (APPs -  Physician Assistants and Nurse Practitioners) who all work together to provide you with the care you need, when you need it.  We recommend signing up for the patient portal called "MyChart".  Sign up information is provided on this After Visit Summary.  MyChart is used to connect with patients for Virtual Visits (Telemedicine).  Patients are able to view lab/test results, encounter notes, upcoming appointments, etc.  Non-urgent messages can be sent to your provider as well.   To learn more about what you can do with MyChart, go to [email or web address].    Your next appointment:   1 year(s)  The format for your next appointment:   In Person  Provider:   Berniece Salines, DO    Diabetes Mellitus and Nutrition, Adult When you have diabetes, or diabetes mellitus, it is very important to have healthy eating habits because your blood sugar (glucose) levels are greatly affected by what you eat and drink. Eating healthy foods in the right amounts, at about the same  times every day, can help you: Manage your blood glucose. Lower your risk of heart disease. Improve your blood pressure. Reach or maintain a healthy weight. What can affect my meal plan? Every person with diabetes is different, and each person has different needs for a meal plan. Your health care provider may recommend that you work with a dietitian to make a meal plan that is best for you. Your meal plan may vary depending on factors such as: The calories you need. The medicines you take. Your weight. Your blood glucose, blood pressure, and cholesterol levels. Your activity level. Other health conditions you have, such as heart or kidney disease. How do carbohydrates affect me? Carbohydrates, also called carbs, affect your blood glucose level more than any other type of food. Eating carbs raises the amount of glucose in your blood. It is important to know how many carbs you can safely have in each meal. This is different for every person. Your dietitian can help you calculate how many carbs you should have at each meal and for each snack. How does alcohol affect me? Alcohol can cause a decrease in blood glucose (hypoglycemia), especially if you use insulin or take certain diabetes medicines by mouth. Hypoglycemia can be a life-threatening condition. Symptoms of hypoglycemia, such as sleepiness, dizziness, and confusion, are similar to symptoms of having too much alcohol. Do not drink alcohol if: Your health care provider tells you not to drink. You are pregnant, may be pregnant, or are planning to become pregnant. If you drink alcohol: Limit  how much you have to: 0-1 drink a day for women. 0-2 drinks a day for men. Know how much alcohol is in your drink. In the [place] one drink equals one 12 oz bottle of beer (355 mL), one 5 oz glass of wine (148 mL), or one 1 oz glass of hard liquor (44 mL). Keep yourself hydrated with water, diet soda, or unsweetened iced tea. Keep in mind that regular  soda, juice, and other mixers may contain a lot of sugar and must be counted as carbs. What are tips for following this plan?  Reading food labels Start by checking the serving size on the Nutrition Facts label of packaged foods and drinks. The number of calories and the amount of carbs, fats, and other nutrients listed on the label are based on one serving of the item. Many items contain more than one serving per package. Check the total grams (g) of carbs in one serving. Check the number of grams of saturated fats and trans fats in one serving. Choose foods that have a low amount or none of these fats. Check the number of milligrams (mg) of salt (sodium) in one serving. Most people should limit total sodium intake to less than 2,300 mg per day. Always check the nutrition information of foods labeled as "low-fat" or "nonfat." These foods may be higher in added sugar or refined carbs and should be avoided. Talk to your dietitian to identify your daily goals for nutrients listed on the label. Shopping Avoid buying canned, pre-made, or processed foods. These foods tend to be high in fat, sodium, and added sugar. Shop around the outside edge of the grocery store. This is where you will most often find fresh fruits and vegetables, bulk grains, fresh meats, and fresh dairy products. Cooking Use low-heat cooking methods, such as baking, instead of high-heat cooking methods, such as deep frying. Cook using healthy oils, such as olive, canola, or sunflower oil. Avoid cooking with butter, cream, or high-fat meats. Meal planning Eat meals and snacks regularly, preferably at the same times every day. Avoid going long periods of time without eating. Eat foods that are high in fiber, such as fresh fruits, vegetables, beans, and whole grains. Eat 4-6 oz (112-168 g) of lean protein each day, such as lean meat, chicken, fish, eggs, or tofu. One ounce (oz) (28 g) of lean protein is equal to: 1 oz (28 g) of meat,  chicken, or fish. 1 egg.  cup (62 g) of tofu. Eat some foods each day that contain healthy fats, such as avocado, nuts, seeds, and fish. What foods should I eat? Fruits Berries. Apples. Oranges. Peaches. Apricots. Plums. Grapes. Mangoes. Papayas. Pomegranates. Kiwi. Cherries. Vegetables Leafy greens, including lettuce, spinach, kale, chard, collard greens, mustard greens, and cabbage. Beets. Cauliflower. Broccoli. Carrots. Green beans. Tomatoes. Peppers. Onions. Cucumbers. Brussels sprouts. Grains Whole grains, such as whole-wheat or whole-grain bread, crackers, tortillas, cereal, and pasta. Unsweetened oatmeal. Quinoa. Brown or wild rice. Meats and other proteins Seafood. Poultry without skin. Lean cuts of poultry and beef. Tofu. Nuts. Seeds. Dairy Low-fat or fat-free dairy products such as milk, yogurt, and cheese. The items listed above may not be a complete list of foods and beverages you can eat and drink. Contact a dietitian for more information. What foods should I avoid? Fruits Fruits canned with syrup. Vegetables Canned vegetables. Frozen vegetables with butter or cream sauce. Grains Refined white flour and flour products such as bread, pasta, snack foods, and cereals. Avoid all processed  foods. Meats and other proteins Fatty cuts of meat. Poultry with skin. Breaded or fried meats. Processed meat. Avoid saturated fats. Dairy Full-fat yogurt, cheese, or milk. Beverages Sweetened drinks, such as soda or iced tea. The items listed above may not be a complete list of foods and beverages you should avoid. Contact a dietitian for more information. Questions to ask a health care provider Do I need to meet with a certified diabetes care and education specialist? Do I need to meet with a dietitian? What number can I call if I have questions? When are the best times to check my blood glucose? Where to find more information: American Diabetes Association: diabetes.org Academy of  Nutrition and Dietetics: eatright.[employer] of Diabetes and Digestive and Kidney Diseases: [employer] Association of Diabetes Care & Education Specialists: diabeteseducator.org Summary It is important to have healthy eating habits because your blood sugar (glucose) levels are greatly affected by what you eat and drink. It is important to use alcohol carefully. A healthy meal plan will help you manage your blood glucose and lower your risk of heart disease. Your health care provider may recommend that you work with a dietitian to make a meal plan that is best for you. This information is not intended to replace advice given to you by your health care provider. Make sure you discuss any questions you have with your health care provider. Document Revised: 12/05/2019 Document Reviewed: 12/05/2019 Elsevier Patient Education  [place].

## 2022-03-31 NOTE — Progress Notes (Signed)
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 hypoglycemia, such as sleepiness, dizziness, and confusion, are similar to symptoms of having too much alcohol. Do not drink alcohol if: Your health care provider tells you not to drink. You are pregnant, may be pregnant, or are planning to become pregnant. If you drink alcohol: Limit how much you have to: 0-1 drink a day for women. 0-2 drinks a day for men. Know how much alcohol is in your drink. In the [place] one drink equals one 12 oz bottle of beer (355 mL), one 5 oz glass of wine (148 mL), or one 1 oz glass of hard liquor (44 mL). Keep yourself hydrated with water, diet soda, or unsweetened iced tea. Keep in mind that regular soda, juice, and other mixers may contain a lot of sugar and must be counted as carbs. What are tips for following this plan?  Reading food labels Start by checking the serving size on the Nutrition Facts label of packaged foods and drinks. The number of calories and the amount of carbs, fats, and other nutrients listed on the label are based on one serving of the item. Many items contain  more than one serving per package. Check the total grams (g) of carbs in one serving. Check the number of grams of saturated fats and trans fats in one serving. Choose foods that have a low amount or none of these fats. Check the number of milligrams (mg) of salt (sodium) in one serving. Most people should limit total sodium intake to less than 2,300 mg per day. Always check the nutrition information of foods labeled as "low-fat" or "nonfat." These foods may be higher in added sugar or refined carbs and should be avoided. Talk to your dietitian to identify your daily goals for nutrients listed on the label. Shopping Avoid buying canned, pre-made, or processed foods. These foods tend to be high in fat, sodium, and added sugar. Shop around the outside edge of the grocery store. This is where you will most often find fresh fruits and vegetables, bulk grains, fresh meats, and fresh dairy products. Cooking Use low-heat cooking methods, such as baking, instead of high-heat cooking methods, such as deep frying. Cook using healthy oils, such as olive, canola, or sunflower oil. Avoid cooking with butter, cream, or high-fat meats. Meal planning Eat meals and snacks regularly, preferably at the same times every day. Avoid going long periods of time without eating. Eat foods that are high in fiber, such as fresh fruits, vegetables, beans, and whole grains. Eat 4-6 oz (112-168 g) of lean protein each day, such as lean meat, chicken, fish, eggs, or tofu. One ounce (oz) (28 g) of lean protein is equal to: 1 oz (28 g) of meat, chicken, or fish. 1 egg.  cup (62 g) of tofu. Eat some foods each day that contain healthy fats, such as avocado, nuts, seeds, and fish.  What foods should I eat? Fruits Berries. Apples. Oranges. Peaches. Apricots. Plums. Grapes. Mangoes. Papayas. Pomegranates. Kiwi. Cherries. Vegetables Leafy greens, including lettuce, spinach, kale, chard, collard greens, mustard greens, and  cabbage. Beets. Cauliflower. Broccoli. Carrots. Green beans. Tomatoes. Peppers. Onions. Cucumbers. Brussels sprouts. Grains Whole grains, such as whole-wheat or whole-grain bread, crackers, tortillas, cereal, and pasta. Unsweetened oatmeal. Quinoa. Brown or wild rice. Meats and other proteins Seafood. Poultry without skin. Lean cuts of poultry and beef. Tofu. Nuts. Seeds. Dairy Low-fat or fat-free dairy products such as milk, yogurt, and cheese. The items listed above may not be a complete list of foods and beverages you can eat and drink. Contact a dietitian for more information. What foods should I avoid? Fruits Fruits canned with syrup. Vegetables Canned vegetables. Frozen vegetables with butter or cream sauce. Grains Refined white flour and flour products such as bread, pasta, snack foods, and cereals. Avoid all processed foods. Meats and other proteins Fatty cuts of meat. Poultry with skin. Breaded or fried meats. Processed meat. Avoid saturated fats. Dairy Full-fat yogurt, cheese, or milk. Beverages Sweetened drinks, such as soda or iced tea. The items listed above may not be a complete list of foods and beverages you should avoid. Contact a dietitian for more information. Questions to ask a health care provider Do I need to meet with a certified diabetes care and education specialist? Do I need to meet with a dietitian? What number can I call if I have questions? When are the best times to check my blood glucose? Where to find more information: American Diabetes Association: diabetes.org Academy of Nutrition and Dietetics: eatright.[employer] of Diabetes and Digestive and Kidney Diseases: [employer] Association of Diabetes Care & Education Specialists: diabeteseducator.org Summary It is important to have healthy eating habits because your blood sugar (glucose) levels are greatly affected by what you eat and drink. It is important to use alcohol carefully. A  healthy meal plan will help you manage your blood glucose and lower your risk of heart disease. Your health care provider may recommend that you work with a dietitian to make a meal plan that is best for you. This information is not intended to replace advice given to you by your health care provider. Make sure you discuss any questions you have with your health care provider. Document Revised: 12/05/2019 Document Reviewed: 12/05/2019 Elsevier Patient Education  [place].    Adopting a Healthy Lifestyle.  Know what a healthy weight is for you (roughly BMI <25) and aim to maintain this   Aim for 7+ servings of fruits and vegetables daily   65-80+ fluid ounces of water or unsweet tea for healthy kidneys   Limit to max 1 drink of alcohol per day; avoid smoking/tobacco   Limit animal fats in diet for cholesterol and heart health - choose grass fed whenever available   Avoid highly processed foods, and foods high in saturated/trans fats   Aim for low stress - take time to unwind and care for your mental health   Aim for 150 min of moderate intensity exercise weekly for heart health, and weights twice weekly for bone health   Aim for 7-9 hours of sleep daily   When it comes to diets, agreement about the perfect plan isnt easy to find, even among the experts. Experts at the [employer] developed an idea known as the Healthy Eating Plate. Just imagine a plate divided into logical, healthy portions.   The emphasis is  on diet quality:   Load up on vegetables and fruits - one-half of your plate: Aim for color and variety, and remember that potatoes dont count.   Go for whole grains - one-quarter of your plate: Whole wheat, barley, wheat berries, quinoa, oats, brown rice, and foods made with them. If you want pasta, go with whole wheat pasta.   Protein power - one-quarter of your plate: Fish, chicken, beans, and nuts are all healthy, versatile protein sources.  Limit red meat.   The diet, however, does go beyond the plate, offering a few other suggestions.   Use healthy plant oils, such as olive, canola, soy, corn, sunflower and peanut. Check the labels, and avoid partially hydrogenated oil, which have unhealthy trans fats.   If youre thirsty, drink water. Coffee and tea are good in moderation, but skip sugary drinks and limit milk and dairy products to one or two daily servings.   The type of carbohydrate in the diet is more important than the amount. Some sources of carbohydrates, such as vegetables, fruits, whole grains, and beans-are healthier than others.   Finally, stay active  Signed, Berniece Salines, DO  04/01/2022 7:56 PM    [facility]

## 2022-04-05 ENCOUNTER — Ambulatory Visit: Payer: Medicare Other | Admitting: Psychology

## 2022-04-11 ENCOUNTER — Ambulatory Visit: Payer: Self-pay

## 2022-04-14 ENCOUNTER — Ambulatory Visit (INDEPENDENT_AMBULATORY_CARE_PROVIDER_SITE_OTHER): Payer: Medicare Other | Admitting: Psychology

## 2022-04-14 DIAGNOSIS — F4323 Adjustment disorder with mixed anxiety and depressed mood: Secondary | ICD-10-CM | POA: Diagnosis not present

## 2022-04-14 NOTE — Progress Notes (Signed)
[name] Time: 4:10p-5:00 p50 minutes   [name] is a 71 y.o. female patient  Date: 04/14/2022  Treatment Plan Diagnosis F43.21 (Adjustment Disorder, With depressed mood) [n/a]  Symptoms A diagnosis of an acute, serious illness that is life-threatening. (Status: maintained) -- No Description Entered  Diminished interest in or enjoyment of activities. (Status: maintained) -- No Description Entered  Lack of energy. (Status: maintained) -- No Description Entered  Sad affect, social withdrawal, anxiety, loss of interest in activities, and low energy. (Status: maintained) -- No Description Entered  Medication Status compliance  Safety unspecified If Suicidal or Homicidal State Action Taken: unspecified  Current Risk:  Medications Buspar (Dosage: 30mg  2X/day)  New Medication (Dosage: 250mg  2x/day)  New Medication (Dosage: 250mg  2x/day)  Prozac (Dosage: 40mg  daily)  Remeron (Dosage: 30mg  nightly)  xanax (Dosage: .5mg  2-3X/day)  Objectives Related Problem: Recognize, accept, and cope with feelings of depression. Description: Identify and replace thoughts and beliefs that support depression. Target Date: 2022-04-28 Frequency: Daily Modality: individual Progress: 70%  Related Problem: Recognize, accept, and cope with feelings of depression. Description: Verbalize an understanding of healthy and unhealthy emotions with the intent of increasing the use of healthy emotions to guide actions. Target Date: 2022-04-28 Frequency: Daily Modality: individual Progress: 70%  Related Problem: Recognize, accept, and cope with feelings of depression. Description: Increasingly verbalize hopeful and positive statements regarding self, others, and the future. Target Date: 2022-04-28 Frequency: Daily Modality: individual Progress: 80%  Related Problem: Reduce fear, anxiety, and worry associated with the medical condition. Description: Share with significant others  efforts to adapt successfully to the medical condition/diagnosis. Target Date: 2022-04-28 Frequency: Daily Modality: individual Progress: 80%  Related Problem: Reduce fear, anxiety, and worry associated with the medical condition. Description: Engage in social, productive, and recreational activities that are possible in spite of medical condition. Target Date: 2022-04-28 Frequency: Daily Modality: individual Progress: 70%  Related Problem: Reduce fear, anxiety, and worry associated with the medical condition. Description: Identify the coping skills and sources of emotional support that have been beneficial in the past. Target Date: 2022-04-28 Frequency: Daily Modality: individual Progress: 58%  Client Response full compliance  Service Location Location, [name] [place], [place] [address]  Service Code cpt [phone]  Related past to present  Facilitate problem solving  Identify/label emotions  Validate/empathize  Self care activities  Lifestyle change (exercise, nutrition)  Assess/facilitate readiness to change  Relaxation training  Rationally challenge thoughts or beliefs/cognitive restructuring  Comments  Dx: F43.21  Meds: Prozac, [name], [name], 2021 (completed). She is also wanting to utilize counseling to adjust and accept her latest diagnosis of cancer. She wants assistance to better manage this news and remain positive throughout treatment. Goal date is 12-22. Patient has made progress,  but her medical condition is variable and she requests additional treatment to manage moods throughout her medical ordeal. In addition, she is trying to manage feelings associated with her older son's mental health struggles. Revised goal date is 12-23.  Patient agrees to have a video session. She  is at home and I am in home office.   [name] states that her [name] and [name] hosted Thanksgiving. She says it was one of the best days she has had in a long time. She loved being with family and meeting some new people. She has been getting out of the house more often and feeling physically better. We talked about the struggle that [name] has with setting limits and saying no if she feels it will upset anyone. She strives to please even at her own expense.                                                                                                             [name] Time: 3:10p-4:00p 50 minutes

## 2022-04-19 ENCOUNTER — Ambulatory Visit (INDEPENDENT_AMBULATORY_CARE_PROVIDER_SITE_OTHER): Payer: Medicare Other | Admitting: Psychology

## 2022-04-19 DIAGNOSIS — F4323 Adjustment disorder with mixed anxiety and depressed mood: Secondary | ICD-10-CM | POA: Diagnosis not present

## 2022-04-19 NOTE — Progress Notes (Signed)
[name] Time: 4:10p-5:00 p50 minutes   [name] is a 71 y.o. female patient  Date: 04/19/2022  Treatment Plan Diagnosis F43.21 (Adjustment Disorder, With depressed mood) [n/a]  Symptoms A diagnosis of an acute, serious illness that is life-threatening. (Status: maintained) -- No Description Entered  Diminished interest in or enjoyment of activities. (Status: maintained) -- No Description Entered  Lack of energy. (Status: maintained) -- No Description Entered  Sad affect, social withdrawal, anxiety, loss of interest in activities, and low energy. (Status: maintained) -- No Description Entered  Medication Status compliance  Safety unspecified If Suicidal or Homicidal State Action Taken: unspecified  Current Risk:  Medications Buspar (Dosage: 30mg  2X/day)  New Medication (Dosage: 250mg  2x/day)  New Medication (Dosage: 250mg  2x/day)  Prozac (Dosage: 40mg  daily)  Remeron (Dosage: 30mg  nightly)  xanax (Dosage: .5mg  2-3X/day)  Objectives Related Problem: Recognize, accept, and cope with feelings of depression. Description: Identify and replace thoughts and beliefs that support depression. Target Date: 2022-10-28 Frequency: Daily Modality: individual Progress: 75%  Related Problem: Recognize, accept, and cope with feelings of depression. Description: Verbalize an understanding of healthy and unhealthy emotions with the intent of increasing the use of healthy emotions to guide actions. Target Date: 2022-10-28 Frequency: Daily Modality: individual Progress: 75%  Related Problem: Recognize, accept, and cope with feelings of depression. Description: Increasingly verbalize hopeful and positive statements regarding self, others, and the future. Target Date: 2022-04-28 Frequency: Daily Modality: individual Progress: 100%  Related Problem: Reduce fear, anxiety, and worry associated with the medical condition. Description: Share with significant others efforts  to adapt successfully to the medical condition/diagnosis. Target Date: 2022-10-28 Frequency: Daily Modality: individual Progress: 85%  Related Problem: Reduce fear, anxiety, and worry associated with the medical condition. Description: Engage in social, productive, and recreational activities that are possible in spite of medical condition. Target Date: 2022-10-28 Frequency: Daily Modality: individual Progress: 90%  Related Problem: Reduce fear, anxiety, and worry associated with the medical condition. Description: Identify the coping skills and sources of emotional support that have been beneficial in the past. Target Date: 2022-10-28 Frequency: Daily Modality: individual Progress: 75%  Client Response full compliance  Service Location Location, [name] [place], [place] [address]  Service Code cpt [phone]  Related past to present  Facilitate problem solving  Identify/label emotions  Validate/empathize  Self care activities  Lifestyle change (exercise, nutrition)  Assess/facilitate readiness to change  Relaxation training  Rationally challenge thoughts or beliefs/cognitive restructuring  Comments  Dx: F43.21  Meds: Prozac, [name], [name], 2021 (completed). She is also wanting to utilize counseling to adjust and accept her latest diagnosis of cancer. She wants assistance to better manage this news and remain positive throughout treatment. Goal date is 12-22. Patient has made progress, but her  medical condition is variable and she requests additional treatment to manage moods throughout her medical ordeal. In addition, she is trying to manage feelings associated with her older son's mental health struggles. Revised goal date is 6-24.  Patient agrees to have a video session. She is  at home and I am in home office.   [name] states that this time of year is hard for her and her moods have been "up and down". It is mostly related to missing "the way it used to be". By this, she means that she longs for the times when the family was intact and everyone was together. [name] is especially bothered that she cannot contact her son and see where he is and how he is doing. She says that her "heart aches". Her other son [name] help her recognize this is the way it has to be. She reviewed all of what happened to her son [name] and all of the circumstances that led to the need for the protective order. She has considerable pain over the situation. She strives to remain grateful for the values taught to her growing up. [facility] with a trainer.                                                                                                               [name] Time: 4:10p-5:00p 50 minutes

## 2022-04-22 ENCOUNTER — Encounter: Payer: Self-pay | Admitting: Internal Medicine

## 2022-04-22 ENCOUNTER — Ambulatory Visit (INDEPENDENT_AMBULATORY_CARE_PROVIDER_SITE_OTHER): Payer: Medicare Other | Admitting: Internal Medicine

## 2022-04-22 VITALS — BP 124/76 | HR 70 | Temp 97.8°F | Ht 62.0 in | Wt 159.0 lb

## 2022-04-22 DIAGNOSIS — F4323 Adjustment disorder with mixed anxiety and depressed mood: Secondary | ICD-10-CM | POA: Diagnosis not present

## 2022-04-22 DIAGNOSIS — N1832 Chronic kidney disease, stage 3b: Secondary | ICD-10-CM | POA: Diagnosis not present

## 2022-04-22 DIAGNOSIS — I10 Essential (primary) hypertension: Secondary | ICD-10-CM

## 2022-04-22 DIAGNOSIS — N3020 Other chronic cystitis without hematuria: Secondary | ICD-10-CM | POA: Diagnosis not present

## 2022-04-22 NOTE — Assessment & Plan Note (Addendum)
On Hydralazine (not taking) and on Bystolic Doing well

## 2022-04-22 NOTE — Assessment & Plan Note (Signed)
OAB

## 2022-04-22 NOTE — Assessment & Plan Note (Signed)
On Buspar, alprazolam

## 2022-04-22 NOTE — Progress Notes (Signed)
Subjective:  Patient ID: [name], female    DOB: January 26, 1951  Age: 71 y.o. MRN: [id number]  CC: Follow-up (Follow up on new medication)   HPI [name] presents for HTN, anxiety, GERD Doing better Stress w/son  On Hydralazine (not taking) and on Bystolic Doing well  Outpatient Medications Prior to Visit  Medication Sig Dispense Refill   acetaminophen (TYLENOL) 325 MG tablet Take 2 tablets (650 mg total) by mouth every 6 (six) hours as needed for moderate pain. 90 tablet 2   ALPRAZolam (XANAX) 1 MG tablet Take 1 tablet (1 mg total) by mouth 3 (three) times daily as needed for anxiety. 90 tablet 5   aspirin EC 81 MG tablet Take 1 tablet (81 mg total) by mouth daily. 30 tablet 3   busPIRone (BUSPAR) 30 MG tablet Take 1 tablet (30 mg total) by mouth 2 (two) times daily. 180 tablet 2   Cholecalciferol (D3 5000) 125 MCG (5000 UT) capsule Take 5,000 Units by mouth daily.     colesevelam (WELCHOL) 625 MG tablet Take 2 tablets (1,250 mg total) by mouth 2 (two) times daily with a meal. 360 tablet 3   D-Mannose 500 MG CAPS Take 500 mg by mouth 2 (two) times daily. WITH CRANBERRY & DANDELION EXTRACT 180 capsule 3   diphenoxylate-atropine (LOMOTIL) 2.5-0.025 MG tablet TAKE 1 OR 2 TABLETS BY MOUTH 4 TIMES A DAY AS NEEDED FOR DIARRHEA/LOSSE STOOLS 60 tablet 1   esomeprazole (NEXIUM) 40 MG capsule TAKE 1 CAPSULE (40 MG TOTAL) BY MOUTH EVERY MORNING. 90 capsule 1   FLUoxetine (PROZAC) 40 MG capsule TAKE 1 CAPSULE BY MOUTH IN THE  MORNING 90 capsule 3   furosemide (LASIX) 20 MG tablet Take 1-2 tablets (20-40 mg total) by mouth daily as needed for edema. TAKE 1 TO 2 TABLETS BY MOUTH  DAILY AS DIRECTED 200 tablet 2   hydrALAZINE (APRESOLINE) 10 MG tablet Take 1 tablet (10 mg total) by mouth 4 (four) times daily as needed. 100 tablet 3   ipratropium-albuterol (DUONEB) 0.5-2.5 (3) MG/3ML SOLN Take 3 mLs by nebulization every 6 (six) hours as needed (Asthma).     iron polysaccharides  (FERREX 150) 150 MG capsule Take 1 capsule (150 mg total) by mouth 2 (two) times daily. 180 capsule 3   levothyroxine (SYNTHROID) 100 MCG tablet Take 1 tablet (100 mcg total) by mouth daily before breakfast. 90 tablet 3   loperamide (IMODIUM A-D) 2 MG tablet Take 2 mg by mouth 4 (four) times daily as needed for diarrhea or loose stools.     METAMUCIL FIBER PO Take 1 capsule by mouth in the morning and at bedtime.     methylphenidate (RITALIN) 10 MG tablet Take 1 tablet (10 mg total) by mouth in the morning. 90 tablet 0   mirtazapine (REMERON) 30 MG tablet TAKE 1 TABLET BY MOUTH AT  BEDTIME 90 tablet 3   nebivolol (BYSTOLIC) 10 MG tablet Take 1 tablet (10 mg total) by mouth daily. 90 tablet 3   omega-3 acid ethyl esters (LOVAZA) 1 g capsule TAKE 2 CAPSULES BY MOUTH 2 TIMES DAILY. 360 capsule 1   ondansetron (ZOFRAN-ODT) 4 MG disintegrating tablet TAKE 1 TABLET BY MOUTH EVERY 8 HOURS AS NEEDED FOR NAUSEA AND VOMITING     OVER THE COUNTER MEDICATION Take 1 capsule by mouth daily. 7 MUSHROOM BLEND     Polyethyl Glycol-Propyl Glycol (SYSTANE OP) Place 1 drop into both eyes 2 (two) times daily as needed (dry eyes).  SUNSCREEN SPF30 EX      traMADol (ULTRAM) 50 MG tablet Take 1 tablet (50 mg total) by mouth every 6 (six) hours as needed for moderate pain. 100 tablet 1   vitamin B-12 (CYANOCOBALAMIN) 1000 MCG tablet Take 1,000 mcg by mouth daily.     vitamin C (ASCORBIC ACID) 500 MG tablet Take 500 mg by mouth daily.     No facility-administered medications prior to visit.    ROS: Review of Systems  Constitutional:  Negative for activity change, appetite change, chills, fatigue and unexpected weight change.  HENT:  Negative for congestion, mouth sores and sinus pressure.   Eyes:  Negative for visual disturbance.  Respiratory:  Negative for cough and chest tightness.   Gastrointestinal:  Negative for abdominal pain and nausea.  Genitourinary:  Negative for difficulty urinating, frequency and  vaginal pain.  Musculoskeletal:  Negative for back pain and gait problem.  Skin:  Negative for pallor and rash.  Neurological:  Negative for dizziness, tremors, weakness, numbness and headaches.  Hematological:  Does not bruise/bleed easily.  Psychiatric/Behavioral:  Negative for confusion, sleep disturbance and suicidal ideas. The patient is nervous/anxious.     Objective:  BP 124/76 (BP Location: Right Arm, Patient Position: Sitting, Cuff Size: Normal)   Pulse 70   Temp 97.8 F (36.6 C) (Oral)   Ht 5\' 2"  (1.575 m)   Wt 159 lb (72.1 kg)   SpO2 98%   BMI 29.08 kg/m   BP Readings from Last 3 Encounters:  04/22/22 124/76  03/31/22 136/78  03/25/22 (!) 138/90    Wt Readings from Last 3 Encounters:  04/22/22 159 lb (72.1 kg)  03/31/22 158 lb (71.7 kg)  03/25/22 156 lb 3.2 oz (70.9 kg)    Physical Exam Constitutional:      General: She is not in acute distress.    Appearance: She is well-developed.  HENT:     Head: Normocephalic.     Right Ear: External ear normal.     Left Ear: External ear normal.     Nose: Nose normal.  Eyes:     General:        Right eye: No discharge.        Left eye: No discharge.     Conjunctiva/sclera: Conjunctivae normal.     Pupils: Pupils are equal, round, and reactive to light.  Neck:     Thyroid: No thyromegaly.     Vascular: No JVD.     Trachea: No tracheal deviation.  Cardiovascular:     Rate and Rhythm: Normal rate and regular rhythm.     Heart sounds: Normal heart sounds.  Pulmonary:     Effort: No respiratory distress.     Breath sounds: No stridor. No wheezing.  Abdominal:     General: Bowel sounds are normal. There is no distension.     Palpations: Abdomen is soft. There is no mass.     Tenderness: There is no abdominal tenderness. There is no guarding or rebound.  Musculoskeletal:        General: No tenderness.     Cervical back: Normal range of motion and neck supple. No rigidity.  Lymphadenopathy:     Cervical: No  cervical adenopathy.  Skin:    Findings: No erythema or rash.  Neurological:     Cranial Nerves: No cranial nerve deficit.     Motor: No abnormal muscle tone.     Coordination: Coordination normal.     Deep Tendon Reflexes: Reflexes normal.  Psychiatric:        Behavior: Behavior normal.        Thought Content: Thought content normal.        Judgment: Judgment normal.     Lab Results  Component Value Date   WBC 7.6 03/19/2022   HGB 12.4 03/19/2022   HCT 36.7 03/19/2022   PLT 348 03/19/2022   GLUCOSE 81 03/19/2022   CHOL 263 (H) 10/18/2018   TRIG 271.0 (H) 10/18/2018   HDL 45.40 10/18/2018   LDLDIRECT 160.0 10/18/2018   LDLCALC UNABLE TO CALCULATE IF TRIGLYCERIDE OVER 400 mg/dL 08/26/2017   ALT 23 03/19/2022   AST 28 03/19/2022   NA 137 03/19/2022   K 3.8 03/19/2022   CL 104 03/19/2022   CREATININE 1.22 (H) 03/19/2022   BUN 14 03/19/2022   CO2 23 03/19/2022   TSH 1.12 03/01/2022   INR 1.0 07/15/2020   HGBA1C 5.8 03/01/2022   MICROALBUR 0.8 12/16/2021    DG Chest 2 View  Result Date: 03/19/2022 CLINICAL DATA:  Shortness of breath, hypertension EXAM: CHEST - 2 VIEW COMPARISON:  10/28/2020 FINDINGS: Cardiac size is within normal limits. Flattening of diaphragms may suggest COPD. There are no signs of pulmonary edema or focal pulmonary consolidation. Blunting of right lateral CP angle has not changed suggesting pleural thickening. Small linear densities in lateral aspect of left lower lung fields may suggest scarring. Surgical staples and linear densities in the right upper lung fields appear stable. There is previous surgical fusion in cervical spine. IMPRESSION: There are no new infiltrates or signs of pulmonary edema. Electronically Signed   By: Elmer Picker M.D.   On: 03/19/2022 20:17   CT Head Wo Contrast  Result Date: 03/19/2022 CLINICAL DATA:  Headache, new or worsening (Age >= 50y) EXAM: CT HEAD WITHOUT CONTRAST TECHNIQUE: Contiguous axial images were obtained  from the base of the skull through the vertex without intravenous contrast. RADIATION DOSE REDUCTION: This exam was performed according to the departmental dose-optimization program which includes automated exposure control, adjustment of the mA and/or kV according to patient size and/or use of iterative reconstruction technique. COMPARISON:  08/04/2021 FINDINGS: Brain: No evidence of acute infarction, hemorrhage, hydrocephalus, extra-axial collection or mass lesion/mass effect. Similar degree of cerebral volume loss. Vascular: No hyperdense vessel or unexpected calcification. Skull: Normal. Negative for fracture or focal lesion. Sinuses/Orbits: No acute finding. Other: None. IMPRESSION: No acute intracranial abnormality. Electronically Signed   By: Davina Poke D.O.   On: 03/19/2022 19:58    Assessment & Plan:   Problem List Items Addressed This Visit     Adjustment disorder with mixed anxiety and depressed mood - Primary    On Buspar, alprazolam      Chronic cystitis    OAB      CKD (chronic kidney disease), stage III ([place])    Hydrate well      HTN (hypertension)    On Hydralazine (not taking) and on Bystolic Doing well         No orders of the defined types were placed in this encounter.     Follow-up: Return in about 3 months (around 07/22/2022) for a follow-up visit.  Walker Kehr, MD

## 2022-04-22 NOTE — Assessment & Plan Note (Signed)
Hydrate well 

## 2022-04-28 ENCOUNTER — Ambulatory Visit: Payer: Medicare Other | Admitting: Psychology

## 2022-04-29 ENCOUNTER — Encounter: Payer: Self-pay | Admitting: Gynecologic Oncology

## 2022-04-30 ENCOUNTER — Telehealth: Payer: Medicare Other | Admitting: Emergency Medicine

## 2022-04-30 ENCOUNTER — Inpatient Hospital Stay: Payer: Medicare Other | Admitting: Gynecologic Oncology

## 2022-04-30 DIAGNOSIS — C55 Malignant neoplasm of uterus, part unspecified: Secondary | ICD-10-CM

## 2022-04-30 DIAGNOSIS — U071 COVID-19: Secondary | ICD-10-CM

## 2022-04-30 NOTE — Progress Notes (Signed)
Hello [name],    Because your home pulse oximetry (oxygen levels) are reading 90%, and you are usually normal (you were 98% on 12/7 at Dr. Judeen Hammans office), I feel your condition warrants further evaluation and I recommend that you be seen in a face to face visit.   Additionally, I cannot prescribe paxlovid by evisit.    NOTE: There will be NO CHARGE for this eVisit   If you are having a true medical emergency please call 911.      For an urgent face to face visit, [facility] has seven urgent care centers for your convenience:     [facility] [place] [facility] [place] Get Driving Directions [phone] [address] [place], [place] [address]    [facility]) Get Driving Directions [phone] [place], [place] [address]   [facility] [place] ([place]) Get Driving Directions [phone] [address] [place],  [place]  [address]   [facility] - at [address] [place] Get Driving Directions  [phone] [address] [place],  [place] [address]     [facility] Get Driving Directions [phone] [address], [place], [place] [address]   [facility] Get Driving Directions  [phone] [address] [place], [place] [address]   [facility] [place] Get Driving Directions [phone] [address], [place], [place] [address]   Your MyChart E-visit questionnaire answers were reviewed by a board certified advanced clinical practitioner to complete your personal care plan based on your specific symptoms.  Thank you for using e-Visits.

## 2022-05-03 ENCOUNTER — Ambulatory Visit (INDEPENDENT_AMBULATORY_CARE_PROVIDER_SITE_OTHER): Payer: Medicare Other | Admitting: Psychology

## 2022-05-03 DIAGNOSIS — F4323 Adjustment disorder with mixed anxiety and depressed mood: Secondary | ICD-10-CM

## 2022-05-03 NOTE — Progress Notes (Signed)
[name] Time: 4:10p-5:00 p50 minutes   [name] is a 71 y.o. female patient  Date: 05/03/2022  Treatment Plan Diagnosis F43.21 (Adjustment Disorder, With depressed mood) [n/a]  Symptoms A diagnosis of an acute, serious illness that is life-threatening. (Status: maintained) -- No Description Entered  Diminished interest in or enjoyment of activities. (Status: maintained) -- No Description Entered  Grant of energy. (Status: maintained) -- No Description Entered  Sad affect, social withdrawal, anxiety, loss of interest in activities, and low energy. (Status: maintained) -- No Description Entered  Medication Status compliance  Safety unspecified If Suicidal or Homicidal State Action Taken: unspecified  Current Risk:  Medications Buspar (Dosage: 30mg  2X/day)  New Medication (Dosage: 250mg  2x/day)  New Medication (Dosage: 250mg  2x/day)  Prozac (Dosage: 40mg  daily)  Remeron (Dosage: 30mg  nightly)  xanax (Dosage: .5mg  2-3X/day)  Objectives Related Problem: Recognize, accept, and cope with feelings of depression. Description: Identify and replace thoughts and beliefs that support depression. Target Date: 2022-10-28 Frequency: Daily Modality: individual Progress: 75%  Related Problem: Recognize, accept, and cope with feelings of depression. Description: Verbalize an understanding of healthy and unhealthy emotions with the intent of increasing the use of healthy emotions to guide actions. Target Date: 2022-10-28 Frequency: Daily Modality: individual Progress: 75%  Related Problem: Recognize, accept, and cope with feelings of depression. Description: Increasingly verbalize hopeful and positive statements regarding self, others, and the future. Target Date: 2022-04-28 Frequency: Daily Modality: individual Progress: 100%  Related Problem: Reduce fear, anxiety, and worry associated with the medical condition. Description: Share  with significant others efforts to adapt successfully to the medical condition/diagnosis. Target Date: 2022-10-28 Frequency: Daily Modality: individual Progress: 85%  Related Problem: Reduce fear, anxiety, and worry associated with the medical condition. Description: Engage in social, productive, and recreational activities that are possible in spite of medical condition. Target Date: 2022-10-28 Frequency: Daily Modality: individual Progress: 90%  Related Problem: Reduce fear, anxiety, and worry associated with the medical condition. Description: Identify the coping skills and sources of emotional support that have been beneficial in the past. Target Date: 2022-10-28 Frequency: Daily Modality: individual Progress: 75%  Client Response full compliance  Service Location Location, [name] [place], [place] [address]  Service Code cpt [phone]  Related past to present  Facilitate problem solving  Identify/label emotions  Validate/empathize  Self care activities  Lifestyle change (exercise, nutrition)  Assess/facilitate readiness to change  Relaxation training  Rationally challenge thoughts or beliefs/cognitive restructuring  Comments  Dx: F43.21  Meds: Prozac, [name], [name], 2021 (completed). She is also wanting to utilize counseling to adjust and accept her latest diagnosis of cancer. She wants assistance to better manage this news and remain positive throughout treatment. Goal date is 12-22.  Patient has made progress, but her medical condition is variable and she requests additional treatment to manage moods throughout her medical ordeal. In addition, she is trying to manage feelings associated with her older son's mental health struggles.  Revised goal date is 6-24.  Patient agrees to have a video session. She is at home and I am in home office.   [name] is working through a cold. She is saying she has been depressed, due to a combination of things. In particular, she is worried about [name] and whether he will make it. That, coupled with the holidays has led her to feel down during this time. She is feeling that whe hasn't been realistic about [name]'s situation and now thinks he will not make it through his addiction. Son [name] is doing well, but [name] fears he may be depressed as well. We talked about the importance of self care, especially during this time.                                                                                                                       [name] Time: 4:10p-5:00p 50 minutes

## 2022-05-06 ENCOUNTER — Ambulatory Visit: Payer: Medicare Other | Admitting: Skilled Nursing Facility1

## 2022-05-07 ENCOUNTER — Ambulatory Visit: Payer: Medicare Other | Admitting: Physician Assistant

## 2022-05-08 ENCOUNTER — Other Ambulatory Visit: Payer: Self-pay | Admitting: Internal Medicine

## 2022-05-12 ENCOUNTER — Ambulatory Visit (INDEPENDENT_AMBULATORY_CARE_PROVIDER_SITE_OTHER): Payer: Medicare Other | Admitting: Psychology

## 2022-05-12 DIAGNOSIS — F4323 Adjustment disorder with mixed anxiety and depressed mood: Secondary | ICD-10-CM

## 2022-05-12 NOTE — Progress Notes (Signed)
[name] Time: 4:10p-5:00 p50 minutes   [name] is a 71 y.o. female patient  Date: 05/12/2022  Treatment Plan Diagnosis F43.21 (Adjustment Disorder, With depressed mood) [n/a]  Symptoms A diagnosis of an acute, serious illness that is life-threatening. (Status: maintained) -- No Description Entered  Diminished interest in or enjoyment of activities. (Status: maintained) -- No Description Entered  Lack of energy. (Status: maintained) -- No Description Entered  Sad affect, social withdrawal, anxiety, loss of interest in activities, and low energy. (Status: maintained) -- No Description Entered  Medication Status compliance  Safety unspecified If Suicidal or Homicidal State Action Taken: unspecified  Current Risk:  Medications Buspar (Dosage: 30mg  2X/day)  New Medication (Dosage: 250mg  2x/day)  New Medication (Dosage: 250mg  2x/day)  Prozac (Dosage: 40mg  daily)  Remeron (Dosage: 30mg  nightly)  xanax (Dosage: .5mg  2-3X/day)  Objectives Related Problem: Recognize, accept, and cope with feelings of depression. Description: Identify and replace thoughts and beliefs that support depression. Target Date: 2022-10-28 Frequency: Daily Modality: individual Progress: 75%  Related Problem: Recognize, accept, and cope with feelings of depression. Description: Verbalize an understanding of healthy and unhealthy emotions with the intent of increasing the use of healthy emotions to guide actions. Target Date: 2022-10-28 Frequency: Daily Modality: individual Progress: 75%  Related Problem: Recognize, accept, and cope with feelings of depression. Description: Increasingly verbalize hopeful and positive statements regarding self, others, and the future. Target Date: 2022-04-28 Frequency: Daily Modality: individual Progress: 100%  Related Problem: Reduce fear, anxiety, and worry associated with the medical  condition. Description: Share with significant others efforts to adapt successfully to the medical condition/diagnosis. Target Date: 2022-10-28 Frequency: Daily Modality: individual Progress: 85%  Related Problem: Reduce fear, anxiety, and worry associated with the medical condition. Description: Engage in social, productive, and recreational activities that are possible in spite of medical condition. Target Date: 2022-10-28 Frequency: Daily Modality: individual Progress: 90%  Related Problem: Reduce fear, anxiety, and worry associated with the medical condition. Description: Identify the coping skills and sources of emotional support that have been beneficial in the past. Target Date: 2022-10-28 Frequency: Daily Modality: individual Progress: 75%  Client Response full compliance  Service Location Location, [name] [place], [place] [address]  Service Code cpt [phone]  Related past to present  Facilitate problem solving  Identify/label emotions  Validate/empathize  Self care activities  Lifestyle change (exercise, nutrition)  Assess/facilitate readiness to change  Relaxation training  Rationally challenge thoughts or beliefs/cognitive restructuring  Comments  Dx: F43.21  Meds: Prozac, [name], [name], 2021 (completed). She is also wanting to utilize counseling to adjust and accept her latest diagnosis of cancer. She wants assistance to better manage this news and remain positive throughout  treatment. Goal date is 12-22. Patient has made progress, but her medical condition is variable and she requests additional treatment to manage moods throughout her medical ordeal. In  addition, she is trying to manage feelings associated with her older son's mental health struggles. Revised goal date is 6-24.  Patient agrees to have a video session. She is at home and I am in home office.   [name] states that the cat that has given her so much joy is now "driving her crazy". The cat likes "a lot of attention". She has received official documentation that the cat is registered as an emotional support animal. Wants me to write a letter of support. She got "bad news" about [name]. He texted a mutual friend of his brother. It was a three page text that was "manic and psychotic". We talked about her decision to "let go" and not get caught up in his dysfunction. She has shifted her expectations with the support of [place]. Hopes to convince her ex-husband to let go as well.                                                                                                                              [name] Time: 3:10p-4:00p 50 minutes

## 2022-05-16 ENCOUNTER — Telehealth: Payer: Medicare Other

## 2022-05-21 ENCOUNTER — Ambulatory Visit (INDEPENDENT_AMBULATORY_CARE_PROVIDER_SITE_OTHER): Payer: Medicare Other | Admitting: Nurse Practitioner

## 2022-05-21 VITALS — BP 134/78 | HR 60 | Temp 98.2°F | Ht 62.0 in | Wt 154.2 lb

## 2022-05-21 DIAGNOSIS — N390 Urinary tract infection, site not specified: Secondary | ICD-10-CM | POA: Diagnosis not present

## 2022-05-21 DIAGNOSIS — R61 Generalized hyperhidrosis: Secondary | ICD-10-CM

## 2022-05-21 DIAGNOSIS — R197 Diarrhea, unspecified: Secondary | ICD-10-CM

## 2022-05-21 DIAGNOSIS — I10 Essential (primary) hypertension: Secondary | ICD-10-CM | POA: Diagnosis not present

## 2022-05-21 DIAGNOSIS — N3090 Cystitis, unspecified without hematuria: Secondary | ICD-10-CM | POA: Diagnosis not present

## 2022-05-21 DIAGNOSIS — Z8639 Personal history of other endocrine, nutritional and metabolic disease: Secondary | ICD-10-CM | POA: Diagnosis not present

## 2022-05-21 LAB — POCT URINALYSIS DIPSTICK
Bilirubin, UA: NEGATIVE
Blood, UA: NEGATIVE
Glucose, UA: NEGATIVE
Ketones, UA: NEGATIVE
Nitrite, UA: NEGATIVE
Protein, UA: NEGATIVE
Spec Grav, UA: 1.015 (ref 1.010–1.025)
Urobilinogen, UA: 0.2 E.U./dL
pH, UA: 6 (ref 5.0–8.0)

## 2022-05-21 LAB — COMPREHENSIVE METABOLIC PANEL
ALT: 21 U/L (ref 0–35)
AST: 28 U/L (ref 0–37)
Albumin: 4.6 g/dL (ref 3.5–5.2)
Alkaline Phosphatase: 132 U/L — ABNORMAL HIGH (ref 39–117)
BUN: 21 mg/dL (ref 6–23)
CO2: 26 mEq/L (ref 19–32)
Calcium: 9.6 mg/dL (ref 8.4–10.5)
Chloride: 99 mEq/L (ref 96–112)
Creatinine, Ser: 1.31 mg/dL — ABNORMAL HIGH (ref 0.40–1.20)
GFR: 40.75 mL/min — ABNORMAL LOW (ref >60.00)
Glucose, Bld: 88 mg/dL (ref 70–99)
Potassium: 4.3 mEq/L (ref 3.5–5.1)
Sodium: 134 mEq/L — ABNORMAL LOW (ref 135–145)
Total Bilirubin: 0.3 mg/dL (ref 0.2–1.2)
Total Protein: 7.5 g/dL (ref 6.0–8.3)

## 2022-05-21 LAB — URINALYSIS WITH CULTURE, IF INDICATED
Bilirubin Urine: NEGATIVE
Hgb urine dipstick: NEGATIVE
Ketones, ur: NEGATIVE
Nitrite: NEGATIVE
RBC / HPF: NONE SEEN (ref 0–?)
Specific Gravity, Urine: 1.01 (ref 1.000–1.030)
Total Protein, Urine: NEGATIVE
Urine Glucose: NEGATIVE
Urobilinogen, UA: 0.2 (ref 0.0–1.0)
pH: 6 (ref 5.0–8.0)

## 2022-05-21 LAB — SEDIMENTATION RATE: Sed Rate: 10 mm/hr (ref 0–30)

## 2022-05-21 LAB — TSH: TSH: 2.68 u[IU]/mL (ref 0.35–5.50)

## 2022-05-21 LAB — C-REACTIVE PROTEIN: CRP: <1 mg/dL (ref 0.5–20.0)

## 2022-05-21 MED ORDER — HYDRALAZINE HCL 10 MG PO TABS: 10.0000 mg | ORAL_TABLET | Freq: Four times a day (QID) | ORAL | 3 refills | Status: DC | PRN

## 2022-05-21 MED ORDER — NEBIVOLOL HCL 10 MG PO TABS: 10.0000 mg | ORAL_TABLET | Freq: Every day | ORAL | 3 refills | Status: DC

## 2022-05-21 MED ORDER — AMOXICILLIN-POT CLAVULANATE 500-125 MG PO TABS: 1.0000 | ORAL_TABLET | Freq: Two times a day (BID) | ORAL | 0 refills | Status: DC

## 2022-05-21 MED ORDER — DIPHENOXYLATE-ATROPINE 2.5-0.025 MG PO TABS: ORAL_TABLET | ORAL | 0 refills | Status: DC

## 2022-05-21 NOTE — Assessment & Plan Note (Signed)
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  She would like refill of Lomotil today.  [employer] controlled [employer] reviewed, refill sent to patient's pharmacy today.

## 2022-05-21 NOTE — Addendum Note (Signed)
Addended by: Ferol Luz, Foday Cone P on: 05/21/2022 04:13 PM   Modules accepted: Orders

## 2022-05-21 NOTE — Assessment & Plan Note (Signed)
Will test urine for signs of UTI, further recommendations may be made based upon these results.

## 2022-05-21 NOTE — Assessment & Plan Note (Signed)
Etiology unclear see plan under hypertension.

## 2022-05-21 NOTE — Progress Notes (Signed)
Established Patient Office Visit  Subjective   Patient ID: [name], female    DOB: [birth year]  Age: 72 y.o. MRN: [id number]  Chief Complaint  Patient presents with   Hypertension    The last week or so she has noticed labile blood pressure at home.  She will experience headache, shortness of breath, and what appears to be a hot flash.  She will then check her blood pressure and blood pressure will sometimes be elevated around 440-347 systolically.  Sometimes blood pressure will be more in line with her baseline which is 130s over 70s.  She is currently prescribed nebivolol 10 mg daily and hydralazine 10 mg every 6 hours as needed for hypertension.  She reports has been taking the hydralazine pretty regularly over the last couple of days.  She is a history of hyperparathyroidism and reports that she had similar symptoms when she was first diagnosed with hyperparathyroidism back in May 2023.  She was treated with surgery and did report normalization of PTH after surgery.  She also reports that she has a history of frequent UTIs and often will have nonclassic symptoms of UTI such as fatigue and syncope.  She is concerned this may be representative of UTI.  She also has a history of hypothyroidism for which she is currently prescribed levothyroxine 100 mcg daily.  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    Review of Systems  Constitutional:  Positive for diaphoresis.  Eyes:  Negative for blurred vision.  Respiratory:  Positive for shortness of breath.   Cardiovascular:  Negative for chest pain.  Gastrointestinal:  Positive for nausea.  Neurological:  Positive for headaches.      Objective:     BP 134/78   Pulse 60   Temp 98.2 F (36.8 C) (Temporal)   Ht 5\' 2"  (1.575 m)   Wt 154 lb 4 oz (70 kg)   SpO2 98%   BMI 28.21 kg/m  BP Readings from Last 3 Encounters:  05/21/22 134/78  04/22/22 124/76  03/31/22 136/78   Wt Readings from Last  3 Encounters:  05/21/22 154 lb 4 oz (70 kg)  04/22/22 159 lb (72.1 kg)  03/31/22 158 lb (71.7 kg)      Physical Exam Vitals reviewed.  Constitutional:      General: She is not in acute distress.    Appearance: Normal appearance.  HENT:     Head: Normocephalic and atraumatic.  Neck:     Vascular: No carotid bruit.  Cardiovascular:     Rate and Rhythm: Normal rate and regular rhythm.     Pulses: Normal pulses.     Heart sounds: Normal heart sounds.  Pulmonary:     Effort: Pulmonary effort is normal.     Breath sounds: Normal breath sounds.  Skin:    General: Skin is warm and dry.  Neurological:     General: No focal deficit present.     Mental Status: She is alert and oriented to person, place, and time.  Psychiatric:        Mood and Affect: Mood normal.        Behavior: Behavior normal.        Judgment: Judgment normal.      No results found for any visits on 05/21/22.    The ASCVD Risk score (Arnett DK, et al., 2019) failed to calculate for the following reasons:   Cannot find a previous HDL lab   Cannot find  a previous total cholesterol lab    Assessment & Plan:   Problem List Items Addressed This Visit       Cardiovascular and Mediastinum   HTN (hypertension)    Chronic, reports recent labile HTN. Blood pressure reasonable in office today. Check PTH, urine for signs if UTI, TSH, CMP for further evaluation.  Patient concerned about potential chronic inflammation, she would like to have inflammatory markers checked.  We did discuss that inflammatory markers such as ESR and CRP are nonspecific which if positive does not necessarily point just to any further recommendations regarding evaluation and treatment.  However due to patient's concern we will check ESR and CRP.  If all are normal may consider checking early morning cortisol level. Patient to follow-up with Dr. Alain Marion for close monitoring in 4 weeks or so. In the meantime patient will take 1 tablet  nebivolol daily, and will continue taking hydralazine every 6 hours as needed to manage systolic blood pressure >480.      Relevant Medications   nebivolol (BYSTOLIC) 10 MG tablet   hydrALAZINE (APRESOLINE) 10 MG tablet   Other Relevant Orders   Sedimentation rate   C-reactive protein   Comprehensive metabolic panel   TSH     Genitourinary   Cystitis    Point-of-care urinalysis positive for leukocytes.  Will treat for possible UTI.  Per chart review last urine culture showed sensitivities to Augmentin.  Patient does have cephalexin listed as allergy, but reports that she is tolerated Augmentin in the past.  Due to CKD stage III will prescribe Augmentin 500-1 25 twice daily x 1 week.  Will also send urine off for urine culture to verify.      Relevant Medications   amoxicillin-clavulanate (AUGMENTIN) 500-125 MG tablet   RESOLVED: Frequent UTI    Will test urine for signs of UTI, further recommendations may be made based upon these results.      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  She would like refill of Lomotil today.  [employer] reviewed, refill sent to patient's pharmacy today.      Relevant Medications   diphenoxylate-atropine (LOMOTIL) 2.5-0.025 MG tablet   History of hyperparathyroidism - Primary    Check PTH level and metabolic panel today for further evaluation, further recommendations may be made based upon his results.      Relevant Orders   PTH, intact (no Ca)   Sedimentation rate   C-reactive protein   Comprehensive metabolic panel   TSH   Diaphoresis    Etiology unclear see plan under hypertension.      Relevant Orders   TSH    Return in about 1 month (around 06/21/2022) for close f/u  with Dr. Alain Marion.  Total time spent on visit today is 49 minutes this was combination of face-to-face evaluation discussion, review patient's chart, and development and discussion of treatment plan.   Ailene Ards, NP

## 2022-05-21 NOTE — Assessment & Plan Note (Signed)
Chronic, reports recent labile HTN. Blood pressure reasonable in office today. Check PTH, urine for signs if UTI, TSH, CMP for further evaluation.  Patient concerned about potential chronic inflammation, she would like to have inflammatory markers checked.  We did discuss that inflammatory markers such as ESR and CRP are nonspecific which if positive does not necessarily point just to any further recommendations regarding evaluation and treatment.  However due to patient's concern we will check ESR and CRP.  If all are normal may consider checking early morning cortisol level. Patient to follow-up with Dr. Alain Marion for close monitoring in 4 weeks or so. In the meantime patient will take 1 tablet nebivolol daily, and will continue taking hydralazine every 6 hours as needed to manage systolic blood pressure >381.

## 2022-05-21 NOTE — Assessment & Plan Note (Signed)
Check PTH level and metabolic panel today for further evaluation, further recommendations may be made based upon his results.

## 2022-05-21 NOTE — Assessment & Plan Note (Signed)
Point-of-care urinalysis positive for leukocytes.  Will treat for possible UTI.  Per chart review last urine culture showed sensitivities to Augmentin.  Patient does have cephalexin listed as allergy, but reports that she is tolerated Augmentin in the past.  Due to CKD stage III will prescribe Augmentin 500-1 25 twice daily x 1 week.  Will also send urine off for urine culture to verify.

## 2022-05-24 ENCOUNTER — Other Ambulatory Visit: Payer: Self-pay

## 2022-05-24 ENCOUNTER — Other Ambulatory Visit: Payer: 59

## 2022-05-24 ENCOUNTER — Telehealth: Payer: Self-pay

## 2022-05-24 DIAGNOSIS — N390 Urinary tract infection, site not specified: Secondary | ICD-10-CM

## 2022-05-24 NOTE — Telephone Encounter (Signed)
Called pt and left detail voice mail in regards to dropping by the office/ lab to give urine sample

## 2022-05-25 LAB — URINE CULTURE

## 2022-05-25 LAB — PARATHYROID HORMONE, INTACT (NO CA)

## 2022-05-26 ENCOUNTER — Ambulatory Visit (INDEPENDENT_AMBULATORY_CARE_PROVIDER_SITE_OTHER): Payer: 59 | Admitting: Psychology

## 2022-05-26 DIAGNOSIS — F4323 Adjustment disorder with mixed anxiety and depressed mood: Secondary | ICD-10-CM | POA: Diagnosis not present

## 2022-05-26 NOTE — Progress Notes (Signed)
[name] Time: 4:10p-5:00 p50 minutes   [name] is a 72 y.o. 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 (Status: maintained) -- No Description Entered  Diminished interest in or enjoyment of activities. (Status: maintained) -- No Description Entered  Lack of energy. (Status: maintained) -- No Description Entered  Sad affect, social withdrawal, anxiety, loss of interest in activities, and low energy. (Status: maintained) -- No Description Entered  Medication Status compliance  Safety unspecified If Suicidal or Homicidal State Action Taken: unspecified  Current Risk:  Medications Buspar (Dosage: 30mg  2X/day)  New Medication (Dosage: 250mg  2x/day)  New Medication (Dosage: 250mg  2x/day)  Prozac (Dosage: 40mg  daily)  Remeron (Dosage: 30mg  nightly)  xanax (Dosage: .5mg  2-3X/day)  Objectives Related Problem: Recognize, accept, and cope with feelings of depression. Description: Identify and replace thoughts and beliefs that support depression. Target Date: 2022-10-28 Frequency: Daily Modality: individual Progress: 75%  Related Problem: Recognize, accept, and cope with feelings of depression. Description: Verbalize an understanding of healthy and unhealthy emotions with the intent of increasing the use of healthy emotions to guide actions. Target Date: 2022-10-28 Frequency: Daily Modality: individual Progress: 75%  Related Problem: Recognize, accept, and cope with feelings of depression. Description: Increasingly verbalize hopeful and positive statements regarding self, others, and the future. Target Date: 2022-04-28 Frequency: Daily Modality: individual Progress: 100%  Related Problem: Reduce fear, anxiety, and worry associated with the medical condition. Description: Share with significant others efforts  to adapt successfully to the medical condition/diagnosis. Target Date: 2022-10-28 Frequency: Daily Modality: individual Progress: 85%  Related Problem: Reduce fear, anxiety, and worry associated with the medical condition. Description: Engage in social, productive, and recreational activities that are possible in spite of medical condition. Target Date: 2022-10-28 Frequency: Daily Modality: individual Progress: 90%  Related Problem: Reduce fear, anxiety, and worry associated with the medical condition. Description: Identify the coping skills and sources of emotional support that have been beneficial in the past. Target Date: 2022-10-28 Frequency: Daily Modality: individual Progress: 75%  Client Response full compliance  Service Location Location, [name] [place], [place] [address]  Service Code cpt [phone]  Related past to present  Facilitate problem solving  Identify/label emotions  Validate/empathize  Self care activities  Lifestyle change (exercise, nutrition)  Assess/facilitate readiness to change  Relaxation training  Rationally challenge thoughts or beliefs/cognitive restructuring  Comments  Dx: F43.21  Meds: Prozac, [name], [name], 2021 (completed). She is also wanting to utilize counseling to adjust and accept her latest diagnosis of cancer. She wants assistance to better manage this news and remain positive throughout treatment. Goal date is 12-22. Patient has made progress, but her  medical condition is variable and she requests additional treatment to manage moods throughout her medical ordeal. In addition, she is trying to manage feelings associated with her older son's mental health struggles. Revised goal date is 6-24.  Patient agrees to have a video session. She is  at home and I am in home office.   [name] states she has a kidney infection and is not feeling well. She has a stone that is blocking the kidney and causing the infection. Her symptoms are fatigue and aches. She is reading more than before to distract herself. Trying hard to remain optimistic. Says she both misses her son [name], but does not miss "the way he is now". She is trying to accept that he may never get better and, if that is true, she does not want to see him. It has been difficult to get to this point. She is putting her medical concerns in perspective, recognizing "things just happen". It does take an emotional toll and contributes to a feeling of isolation. Discussed the importance of staying connected to others.                                                                                                                                 [name] Time: 3:10p-4:00p 50 minutes

## 2022-05-27 ENCOUNTER — Other Ambulatory Visit: Payer: Self-pay | Admitting: Nurse Practitioner

## 2022-05-27 DIAGNOSIS — N3000 Acute cystitis without hematuria: Secondary | ICD-10-CM

## 2022-05-27 MED ORDER — CIPROFLOXACIN HCL 250 MG PO TABS: 250.0000 mg | ORAL_TABLET | Freq: Two times a day (BID) | ORAL | 0 refills | Status: AC

## 2022-05-27 NOTE — Progress Notes (Signed)
Estimated Creatinine Clearance: 35.6 mL/min (A) (by C-G formula based on SCr of 1.31 mg/dL (H)).  Please call patient and let her know that her urine culture results show her UTI is resistant to the Augmentin (amoxicillin-clavulanate) that she was prescribed last week. For this reason, I will send in a course of ciprofloxacin 250mg  tablets. She should take 1 tablet by mouth in the morning and the evening for 3 days. Please warn her of potential for severe diarrhea and tendon rupture. If she were to experience frequent diarrhea or worsening joint pain while taking the medication she should stop the medication and notify the office. Please let me know if there are any questions. Script sent to [facility] on [address]

## 2022-05-28 ENCOUNTER — Other Ambulatory Visit: Payer: Self-pay | Admitting: Nurse Practitioner

## 2022-05-28 DIAGNOSIS — N3000 Acute cystitis without hematuria: Secondary | ICD-10-CM

## 2022-05-31 ENCOUNTER — Ambulatory Visit (INDEPENDENT_AMBULATORY_CARE_PROVIDER_SITE_OTHER): Payer: 59 | Admitting: Psychology

## 2022-05-31 DIAGNOSIS — F4323 Adjustment disorder with mixed anxiety and depressed mood: Secondary | ICD-10-CM | POA: Diagnosis not present

## 2022-05-31 NOTE — Progress Notes (Signed)
[name] Time: 4:10p-5:00 p50 minutes   [name] is a 72 y.o. female patient  Date: 05/31/2022  Treatment Plan Diagnosis F43.21 (Adjustment Disorder, With depressed mood) [n/a]  Symptoms A diagnosis of an acute, serious illness that is life-threatening. (Status: maintained) -- No Description Entered  Diminished interest in or enjoyment of activities. (Status: maintained) -- No Description Entered  Lack of energy. (Status: maintained) -- No Description Entered  Sad affect, social withdrawal, anxiety, loss of interest in activities, and low energy. (Status: maintained) -- No Description Entered  Medication Status compliance  Safety unspecified If Suicidal or Homicidal State Action Taken: unspecified  Current Risk:  Medications Buspar (Dosage: 30mg  2X/day)  New Medication (Dosage: 250mg  2x/day)  New Medication (Dosage: 250mg  2x/day)  Prozac (Dosage: 40mg  daily)  Remeron (Dosage: 30mg  nightly)  xanax (Dosage: .5mg  2-3X/day)  Objectives Related Problem: Recognize, accept, and cope with feelings of depression. Description: Identify and replace thoughts and beliefs that support depression. Target Date: 2022-10-28 Frequency: Daily Modality: individual Progress: 75%  Related Problem: Recognize, accept, and cope with feelings of depression. Description: Verbalize an understanding of healthy and unhealthy emotions with the intent of increasing the use of healthy emotions to guide actions. Target Date: 2022-10-28 Frequency: Daily Modality: individual Progress: 75%  Related Problem: Recognize, accept, and cope with feelings of depression. Description: Increasingly verbalize hopeful and positive statements regarding self, others, and the future. Target Date: 2022-04-28 Frequency: Daily Modality: individual Progress: 100%  Related Problem: Reduce fear, anxiety, and worry associated with the medical condition. Description: Share with significant others efforts  to adapt successfully to the medical condition/diagnosis. Target Date: 2022-10-28 Frequency: Daily Modality: individual Progress: 85%  Related Problem: Reduce fear, anxiety, and worry associated with the medical condition. Description: Engage in social, productive, and recreational activities that are possible in spite of medical condition. Target Date: 2022-10-28 Frequency: Daily Modality: individual Progress: 90%  Related Problem: Reduce fear, anxiety, and worry associated with the medical condition. Description: Identify the coping skills and sources of emotional support that have been beneficial in the past. Target Date: 2022-10-28 Frequency: Daily Modality: individual Progress: 75%  Client Response full compliance  Service Location Location, [name] [place], [place] [address]  Service Code cpt [phone]  Related past to present  Facilitate problem solving  Identify/label emotions  Validate/empathize  Self care activities  Lifestyle change (exercise, nutrition)  Assess/facilitate readiness to change  Relaxation training  Rationally challenge thoughts or beliefs/cognitive restructuring  Comments  Dx: F43.21  Meds: Prozac, [name], [name], 2021 (completed). She is also wanting to utilize counseling to adjust and accept her latest diagnosis of cancer. She wants assistance to better manage this news and remain positive throughout treatment. Goal date is 12-22. Patient has made progress, but her  medical condition is variable and she requests additional treatment to manage moods throughout her medical ordeal. In addition, she is trying to manage feelings associated with her older son's mental health struggles. Revised goal date is 6-24.  Patient agrees to have a video session. She is  at home and I am in home office.   [name] says she is not feeling well and will be going to the doctor tomorrow. It is "also emotional". She has been feeling a lot of anger at her son [name]. This is a change from her previous sadness. She says it is to the point of her lacking motivation to do anything. She says that for so long she has felt sadness for her son's lack of responsiveness to all of the family's efforts. Now she is feeling "played" by [name]. She feels like [name] has manipulate her and the family. 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 [name] has considerable anger about how [name] deceived and manipulated her. I reflected that [name] is mostly angry at herself for not recognizing all of the signs through the years that he was abusing all kinds of drugs. She says he is a "con-artist" and no longer feels the empathy she once felt. [name] has been to rehab. at age 11 and age 41, to no avail. By the end of the session she reports feeling better and anticipates being able work past these distressing feelings.                                                                                                                                        [name] Time: 3:10p-4:00p 50 minutes

## 2022-06-01 ENCOUNTER — Ambulatory Visit: Payer: Medicare Other | Admitting: Internal Medicine

## 2022-06-01 ENCOUNTER — Other Ambulatory Visit: Payer: 59

## 2022-06-01 DIAGNOSIS — N390 Urinary tract infection, site not specified: Secondary | ICD-10-CM

## 2022-06-02 LAB — URINE CULTURE

## 2022-06-07 ENCOUNTER — Other Ambulatory Visit: Payer: Self-pay | Admitting: Internal Medicine

## 2022-06-07 ENCOUNTER — Other Ambulatory Visit: Payer: Self-pay | Admitting: Nurse Practitioner

## 2022-06-07 DIAGNOSIS — N3090 Cystitis, unspecified without hematuria: Secondary | ICD-10-CM

## 2022-06-07 DIAGNOSIS — R197 Diarrhea, unspecified: Secondary | ICD-10-CM

## 2022-06-07 NOTE — Telephone Encounter (Signed)
Ok to pcp please °

## 2022-06-09 ENCOUNTER — Ambulatory Visit (INDEPENDENT_AMBULATORY_CARE_PROVIDER_SITE_OTHER): Payer: 59 | Admitting: Psychology

## 2022-06-09 DIAGNOSIS — F4323 Adjustment disorder with mixed anxiety and depressed mood: Secondary | ICD-10-CM | POA: Diagnosis not present

## 2022-06-09 NOTE — Progress Notes (Signed)
[name] Time: 4:10p-5:00 p50 minutes   [name] is a 72 y.o. female patient  Date: 06/09/2022  Treatment Plan Diagnosis F43.21 (Adjustment Disorder, With depressed mood) [n/a]  Symptoms A diagnosis of an acute, serious illness that is life-threatening. (Status: maintained) -- No Description Entered  Diminished interest in or enjoyment of activities. (Status: maintained) -- No Description Entered  Lack of energy. (Status: maintained) -- No Description Entered  Sad affect, social withdrawal, anxiety, loss of interest in activities, and low energy. (Status: maintained) -- No Description Entered  Medication Status compliance  Safety unspecified If Suicidal or Homicidal State Action Taken: unspecified  Current Risk:  Medications Buspar (Dosage: 30mg  2X/day)  New Medication (Dosage: 250mg  2x/day)  New Medication (Dosage: 250mg  2x/day)  Prozac (Dosage: 40mg  daily)  Remeron (Dosage: 30mg  nightly)  xanax (Dosage: .5mg  2-3X/day)  Objectives Related Problem: Recognize, accept, and cope with feelings of depression. Description: Identify and replace thoughts and beliefs that support depression. Target Date: 2022-10-28 Frequency: Daily Modality: individual Progress: 75%  Related Problem: Recognize, accept, and cope with feelings of depression. Description: Verbalize an understanding of healthy and unhealthy emotions with the intent of increasing the use of healthy emotions to guide actions. Target Date: 2022-10-28 Frequency: Daily Modality: individual Progress: 75%  Related Problem: Recognize, accept, and cope with feelings of depression. Description: Increasingly verbalize hopeful and positive statements regarding self, others, and the future. Target Date: 2022-04-28 Frequency: Daily Modality: individual Progress: 100%  Related Problem: Reduce fear, anxiety, and worry associated with the medical condition. Description: Share  with significant others efforts to adapt successfully to the medical condition/diagnosis. Target Date: 2022-10-28 Frequency: Daily Modality: individual Progress: 85%  Related Problem: Reduce fear, anxiety, and worry associated with the medical condition. Description: Engage in social, productive, and recreational activities that are possible in spite of medical condition. Target Date: 2022-10-28 Frequency: Daily Modality: individual Progress: 90%  Related Problem: Reduce fear, anxiety, and worry associated with the medical condition. Description: Identify the coping skills and sources of emotional support that have been beneficial in the past. Target Date: 2022-10-28 Frequency: Daily Modality: individual Progress: 75%  Client Response full compliance  Service Location Location, [name] [place], [place] [address]  Service Code cpt [phone]  Related past to present  Facilitate problem solving  Identify/label emotions  Validate/empathize  Self care activities  Lifestyle change (exercise, nutrition)  Assess/facilitate readiness to change  Relaxation training  Rationally challenge thoughts or beliefs/cognitive restructuring  Comments  Dx: F43.21  Meds: Prozac, [name], [name], 2021 (completed). She is also wanting to utilize counseling to adjust and accept her latest diagnosis of cancer. She wants assistance to better manage this news and remain positive throughout treatment. Goal date is 12-22.  Patient has made progress, but her medical condition is variable and she requests additional treatment to manage moods throughout her medical ordeal. In addition, she is trying to manage feelings associated with her older son's mental health struggles.  Revised goal date is 6-24.  Patient agrees to have a video session. She is at home and I am in home office.   [name] says she is going to nutritionist tomorrow regarding her chronic kidney disease and compromised immune system. She is invested in changing her diet to improve her health. She talked extensively about what she has learned about good nutrition. She is extremely invested  in clean eating                                                                                                                                         [name] Time: 3:10p-4:00p 50 minutes

## 2022-06-10 ENCOUNTER — Encounter: Payer: Self-pay | Admitting: Skilled Nursing Facility1

## 2022-06-10 ENCOUNTER — Encounter: Payer: 59 | Attending: Internal Medicine | Admitting: Skilled Nursing Facility1

## 2022-06-10 DIAGNOSIS — N189 Chronic kidney disease, unspecified: Secondary | ICD-10-CM | POA: Diagnosis not present

## 2022-06-10 DIAGNOSIS — I129 Hypertensive chronic kidney disease with stage 1 through stage 4 chronic kidney disease, or unspecified chronic kidney disease: Secondary | ICD-10-CM | POA: Insufficient documentation

## 2022-06-10 DIAGNOSIS — Z713 Dietary counseling and surveillance: Secondary | ICD-10-CM | POA: Insufficient documentation

## 2022-06-10 NOTE — Progress Notes (Signed)
Medical Nutrition Therapy   Primary concerns today: overall health for her health conditions   Referral diagnosis: CKD  NUTRITION ASSESSMENT    Clinical Medical Hx: cancer, IBS-D, anemia, anxiety, depression, GERD, hyperlipidemia, HTN, CKD, thyroid disease, tuberculosis, anemia, bipolar, migraines, Parathyroidectomy  Medications: see list Labs: sodium 134, alkaline phosphatase 132, GFR 40.75 Notable Signs/Symptoms: none stated  Lifestyle & Dietary Hx   Pt states she has had cancer a couple times. Pt states she feels she has lost her strength stating she has been scheduled for PT but has not gone.  Pt state she has a plant based diet and does not eat sugar and is on a low sodium diet.  Pt states she has a Optician, dispensing with weekly appts once a week.   Pt states she avoids caffeine and alcohol.  Pt states she typically would not cook but has been trying to get back into it.   Estimated daily fluid intake: 64+ oz Supplements:  Sleep: states she sleeps well Stress / self-care: state she has a lot of anxiety and depression  Current average weekly physical activity: ADLs  24-Hr Dietary Recall: 1 apple daily sometimes 2 in a day First Meal: decaf green tea + tsp raw honey and oatmeal + raisins + walnuts sometimes with coconut almond milk Snack: apple Second Meal: spaghetti + meat sauce + mixed green and tomato salad or Ezekiel bread + plant butter + honey + cinnomon Snack: Dewe[employer] chocolate cookies Third Meal: soup: broccoli + low sodium cream of mushroom + shredded carrots + mushrooms + onion + black pepper + curry Snack:  Beverages: water + lemon, decaf tea   NUTRITION DIAGNOSIS  NB-1.1 Food and nutrition-related knowledge deficit As related to newly diagnosed CKD.  As evidenced by new referral.   NUTRITION INTERVENTION  Nutrition education (E-1) on the following topics:  CKD and plant based eating  Learning Style & Readiness for Change Teaching method utilized:  Visual & Auditory  Demonstrated degree of understanding via: Teach Back  Barriers to learning/adherence to lifestyle change: none identified   Goals Established by Pt Get in with the PT to help you with more stability  Be sure to eat a varitry of plants for your protein such as nuts, seeds, lentils, beans, and Quinoa    MONITORING & EVALUATION Dietary intake, weekly physical activity  Next Steps  Patient is to call or email with any future questions or concerns

## 2022-06-14 ENCOUNTER — Ambulatory Visit (INDEPENDENT_AMBULATORY_CARE_PROVIDER_SITE_OTHER): Payer: 59 | Admitting: Internal Medicine

## 2022-06-14 ENCOUNTER — Encounter: Payer: Self-pay | Admitting: Internal Medicine

## 2022-06-14 ENCOUNTER — Ambulatory Visit (INDEPENDENT_AMBULATORY_CARE_PROVIDER_SITE_OTHER): Payer: 59 | Admitting: Psychology

## 2022-06-14 VITALS — BP 138/80 | HR 65 | Temp 98.0°F | Ht 62.0 in | Wt 158.0 lb

## 2022-06-14 DIAGNOSIS — E559 Vitamin D deficiency, unspecified: Secondary | ICD-10-CM | POA: Diagnosis not present

## 2022-06-14 DIAGNOSIS — N183 Chronic kidney disease, stage 3 unspecified: Secondary | ICD-10-CM

## 2022-06-14 DIAGNOSIS — F4323 Adjustment disorder with mixed anxiety and depressed mood: Secondary | ICD-10-CM | POA: Diagnosis not present

## 2022-06-14 DIAGNOSIS — F419 Anxiety disorder, unspecified: Secondary | ICD-10-CM | POA: Diagnosis not present

## 2022-06-14 DIAGNOSIS — R109 Unspecified abdominal pain: Secondary | ICD-10-CM | POA: Diagnosis not present

## 2022-06-14 DIAGNOSIS — N3000 Acute cystitis without hematuria: Secondary | ICD-10-CM

## 2022-06-14 LAB — URINALYSIS, ROUTINE W REFLEX MICROSCOPIC
Bilirubin Urine: NEGATIVE
Ketones, ur: NEGATIVE
Nitrite: NEGATIVE
Specific Gravity, Urine: 1.01 (ref 1.000–1.030)
Total Protein, Urine: NEGATIVE
Urine Glucose: NEGATIVE
Urobilinogen, UA: 0.2 (ref 0.0–1.0)
pH: 6 (ref 5.0–8.0)

## 2022-06-14 NOTE — Assessment & Plan Note (Signed)
L flank Check UA/US renal

## 2022-06-14 NOTE — Assessment & Plan Note (Signed)
Recurrent L flank pain, chronic dysuria Recent PET CT w/L nephrolithiasis

## 2022-06-14 NOTE — Assessment & Plan Note (Signed)
Xanax as needed - increase dose due to tolerance  Potential benefits of a long term benzodiazepines  use as well as potential risks  and complications were explained to the patient and were aknowledged. Prozac and Remeron per Dr. Casimiro Needle

## 2022-06-14 NOTE — Assessment & Plan Note (Signed)
On Vit D 

## 2022-06-14 NOTE — Assessment & Plan Note (Addendum)
Hydrate well ?Monitor GFR ?

## 2022-06-14 NOTE — Progress Notes (Signed)
[name] Time: 4:10p-5:00 p50 minutes   [name] is a 72 y.o. 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 (Status: maintained) -- No Description Entered  Diminished interest in or enjoyment of activities. (Status: maintained) -- No Description Entered  Lack of energy. (Status: maintained) -- No Description Entered  Sad affect, social withdrawal, anxiety, loss of interest in activities, and low energy. (Status: maintained) -- No Description Entered  Medication Status compliance  Safety unspecified If Suicidal or Homicidal State Action Taken: unspecified  Current Risk:  Medications Buspar (Dosage: 30mg  2X/day)  New Medication (Dosage: 250mg  2x/day)  New Medication (Dosage: 250mg  2x/day)  Prozac (Dosage: 40mg  daily)  Remeron (Dosage: 30mg  nightly)  xanax (Dosage: .5mg  2-3X/day)  Objectives Related Problem: Recognize, accept, and cope with feelings of depression. Description: Identify and replace thoughts and beliefs that support depression. Target Date: 2022-10-28 Frequency: Daily Modality: individual Progress: 75%  Related Problem: Recognize, accept, and cope with feelings of depression. Description: Verbalize an understanding of healthy and unhealthy emotions with the intent of increasing the use of healthy emotions to guide actions. Target Date: 2022-10-28 Frequency: Daily Modality: individual Progress: 75%  Related Problem: Recognize, accept, and cope with feelings of depression. Description: Increasingly verbalize hopeful and positive statements regarding self, others, and the future. Target Date: 2022-04-28 Frequency: Daily Modality: individual Progress: 100%  Related Problem: Reduce fear, anxiety, and worry associated with the medical condition. Description: Share with significant others efforts  to adapt successfully to the medical condition/diagnosis. Target Date: 2022-10-28 Frequency: Daily Modality: individual Progress: 85%  Related Problem: Reduce fear, anxiety, and worry associated with the medical condition. Description: Engage in social, productive, and recreational activities that are possible in spite of medical condition. Target Date: 2022-10-28 Frequency: Daily Modality: individual Progress: 90%  Related Problem: Reduce fear, anxiety, and worry associated with the medical condition. Description: Identify the coping skills and sources of emotional support that have been beneficial in the past. Target Date: 2022-10-28 Frequency: Daily Modality: individual Progress: 75%  Client Response full compliance  Service Location Location, [name] [place], [place] [address]  Service Code cpt [phone]  Related past to present  Facilitate problem solving  Identify/label emotions  Validate/empathize  Self care activities  Lifestyle change (exercise, nutrition)  Assess/facilitate readiness to change  Relaxation training  Rationally challenge thoughts or beliefs/cognitive restructuring  Comments  Dx: F43.21  Meds: Prozac, [name], [name], 2021 (completed). She is also wanting to utilize counseling to adjust and accept her latest diagnosis of cancer. She wants assistance to better manage this news and remain positive throughout treatment. Goal date is 12-22. Patient has made progress, but her  medical condition is variable and she requests additional treatment to manage moods throughout her medical ordeal. In addition, she is trying to manage feelings associated with her older son's mental health struggles. Revised goal date is 6-24.  Patient agrees to have a video session. She is  at home and I am in home office.   [name] has cystitis again and is having low back pain. She also has a kidney stone that will likely need to be addressed. She is keeping her moods positive. She has been watching a lot of TV documentaries and shows. [name] went to the dietician and had a very good meeting. Her diet was validated as very healthy. She does say she is aware of being "a little depressed". She says she is missing being employed and feeling purposeful. We talked about the need to try and stay stimulated and engaged with others. Her biggest worry is still her son [name]. She has many fears about his future. Talked about how to use support system to deal with her anxiety.                                                                                                                                                [name] Time: 4:10p-5:00p 50 minutes

## 2022-06-14 NOTE — Progress Notes (Signed)
Subjective:  Patient ID: [name], female    DOB: [birth year]  Age: 72 y.o. MRN: [id number]  CC: Hypertension (And having pain in left side by kidney thinks she may have a kidney stone)   HPI [name] presents for UTI, anxiety, depression f/u C/o L flank pain   Outpatient Medications Prior to Visit  Medication Sig Dispense Refill   acetaminophen (TYLENOL) 325 MG tablet Take 2 tablets (650 mg total) by mouth every 6 (six) hours as needed for moderate pain. 90 tablet 2   ALPRAZolam (XANAX) 1 MG tablet Take 1 tablet (1 mg total) by mouth 3 (three) times daily as needed for anxiety. 90 tablet 5   amoxicillin-clavulanate (AUGMENTIN) 500-125 MG tablet Take 1 tablet by mouth 2 (two) times daily. 14 tablet 0   aspirin EC 81 MG tablet Take 1 tablet (81 mg total) by mouth daily. 30 tablet 3   busPIRone (BUSPAR) 30 MG tablet TAKE 1 TABLET BY MOUTH TWICE  DAILY 200 tablet 1   Cholecalciferol (D3 5000) 125 MCG (5000 UT) capsule Take 5,000 Units by mouth daily.     ciprofloxacin (CIPRO) 500 MG tablet TAKE 1 TABLET BY MOUTH TWICE A DAY FOR 10 DAYS 20 tablet 0   colesevelam (WELCHOL) 625 MG tablet Take 2 tablets (1,250 mg total) by mouth 2 (two) times daily with a meal. 360 tablet 3   D-Mannose 500 MG CAPS Take 500 mg by mouth 2 (two) times daily. WITH CRANBERRY & DANDELION EXTRACT 180 capsule 3   diphenoxylate-atropine (LOMOTIL) 2.5-0.025 MG tablet TAKE 1 TABLET BY MOUTH 4 TIMES  DAILY AS NEEDED FOR DIARRHEA OR  LOOSE STOOLS 60 tablet 1   esomeprazole (NEXIUM) 40 MG capsule TAKE 1 CAPSULE (40 MG TOTAL) BY MOUTH EVERY MORNING. 90 capsule 1   FLUoxetine (PROZAC) 40 MG capsule TAKE 1 CAPSULE BY MOUTH IN THE  MORNING 90 capsule 3   furosemide (LASIX) 20 MG tablet Take 1-2 tablets (20-40 mg total) by mouth daily as needed for edema. TAKE 1 TO 2 TABLETS BY MOUTH  DAILY AS DIRECTED 200 tablet 2   hydrALAZINE (APRESOLINE) 10 MG tablet Take 1 tablet (10 mg total) by mouth 4 (four) times  daily as needed. 100 tablet 3   ipratropium-albuterol (DUONEB) 0.5-2.5 (3) MG/3ML SOLN Take 3 mLs by nebulization every 6 (six) hours as needed (Asthma).     iron polysaccharides (FERREX 150) 150 MG capsule Take 1 capsule (150 mg total) by mouth 2 (two) times daily. 180 capsule 3   levothyroxine (SYNTHROID) 100 MCG tablet Take 1 tablet (100 mcg total) by mouth daily before breakfast. 90 tablet 3   loperamide (IMODIUM A-D) 2 MG tablet Take 2 mg by mouth 4 (four) times daily as needed for diarrhea or loose stools.     METAMUCIL FIBER PO Take 1 capsule by mouth in the morning and at bedtime.     methylphenidate (RITALIN) 10 MG tablet Take 1 tablet (10 mg total) by mouth in the morning. 90 tablet 0   mirtazapine (REMERON) 30 MG tablet TAKE 1 TABLET BY MOUTH AT  BEDTIME 90 tablet 3   nebivolol (BYSTOLIC) 10 MG tablet Take 1 tablet (10 mg total) by mouth daily. 90 tablet 3   omega-3 acid ethyl esters (LOVAZA) 1 g capsule TAKE 2 CAPSULES BY MOUTH 2 TIMES DAILY. 360 capsule 1   ondansetron (ZOFRAN-ODT) 4 MG disintegrating tablet TAKE 1 TABLET BY MOUTH EVERY 8 HOURS AS NEEDED FOR NAUSEA AND VOMITING  OVER THE COUNTER MEDICATION Take 1 capsule by mouth daily. 7 MUSHROOM BLEND     Polyethyl Glycol-Propyl Glycol (SYSTANE OP) Place 1 drop into both eyes 2 (two) times daily as needed (dry eyes).      SUNSCREEN SPF30 EX      traMADol (ULTRAM) 50 MG tablet Take 1 tablet (50 mg total) by mouth every 6 (six) hours as needed for moderate pain. 100 tablet 1   vitamin B-12 (CYANOCOBALAMIN) 1000 MCG tablet Take 1,000 mcg by mouth daily.     vitamin C (ASCORBIC ACID) 500 MG tablet Take 500 mg by mouth daily.     No facility-administered medications prior to visit.    ROS: Review of Systems  Constitutional:  Positive for fatigue. Negative for activity change, appetite change, chills and unexpected weight change.  HENT:  Negative for congestion, mouth sores and sinus pressure.   Eyes:  Negative for visual  disturbance.  Respiratory:  Negative for cough and chest tightness.   Gastrointestinal:  Negative for abdominal pain and nausea.  Genitourinary:  Negative for difficulty urinating, frequency and vaginal pain.  Musculoskeletal:  Positive for arthralgias. Negative for back pain and gait problem.  Skin:  Positive for color change. Negative for pallor and rash.  Neurological:  Negative for dizziness, tremors, weakness, numbness and headaches.  Hematological:  Does not bruise/bleed easily.  Psychiatric/Behavioral:  Negative for confusion, sleep disturbance and suicidal ideas. The patient is nervous/anxious.     Objective:  BP 138/80 (BP Location: Right Arm, Patient Position: Sitting, Cuff Size: Normal)   Pulse 65   Temp 98 F (36.7 C) (Oral)   Ht 5\' 2"  (1.575 m)   Wt 158 lb (71.7 kg)   SpO2 99%   BMI 28.90 kg/m   BP Readings from Last 3 Encounters:  06/14/22 138/80  05/21/22 134/78  04/22/22 124/76    Wt Readings from Last 3 Encounters:  06/14/22 158 lb (71.7 kg)  05/21/22 154 lb 4 oz (70 kg)  04/22/22 159 lb (72.1 kg)    Physical Exam Constitutional:      General: She is not in acute distress.    Appearance: She is well-developed. She is obese.  HENT:     Head: Normocephalic.     Right Ear: External ear normal.     Left Ear: External ear normal.     Nose: Nose normal.  Eyes:     General:        Right eye: No discharge.        Left eye: No discharge.     Conjunctiva/sclera: Conjunctivae normal.     Pupils: Pupils are equal, round, and reactive to light.  Neck:     Thyroid: No thyromegaly.     Vascular: No JVD.     Trachea: No tracheal deviation.  Cardiovascular:     Rate and Rhythm: Normal rate and regular rhythm.     Heart sounds: Normal heart sounds.  Pulmonary:     Effort: No respiratory distress.     Breath sounds: No stridor. No wheezing.  Abdominal:     General: Bowel sounds are normal. There is no distension.     Palpations: Abdomen is soft. There is no  mass.     Tenderness: There is no abdominal tenderness. There is no guarding or rebound.  Musculoskeletal:        General: No tenderness.     Cervical back: Normal range of motion and neck supple. No rigidity.  Lymphadenopathy:  Cervical: No cervical adenopathy.  Skin:    Findings: No erythema or rash.  Neurological:     Cranial Nerves: No cranial nerve deficit.     Motor: No abnormal muscle tone.     Coordination: Coordination normal.     Deep Tendon Reflexes: Reflexes normal.  Psychiatric:        Behavior: Behavior normal.        Thought Content: Thought content normal.        Judgment: Judgment normal.     Lab Results  Component Value Date   WBC 7.6 03/19/2022   HGB 12.4 03/19/2022   HCT 36.7 03/19/2022   PLT 348 03/19/2022   GLUCOSE 88 05/21/2022   CHOL 263 (H) 10/18/2018   TRIG 271.0 (H) 10/18/2018   HDL 45.40 10/18/2018   LDLDIRECT 160.0 10/18/2018   LDLCALC UNABLE TO CALCULATE IF TRIGLYCERIDE OVER 400 mg/dL 08/26/2017   ALT 21 05/21/2022   AST 28 05/21/2022   NA 134 (L) 05/21/2022   K 4.3 05/21/2022   CL 99 05/21/2022   CREATININE 1.31 (H) 05/21/2022   BUN 21 05/21/2022   CO2 26 05/21/2022   TSH 2.68 05/21/2022   INR 1.0 07/15/2020   HGBA1C 5.8 03/01/2022   MICROALBUR 0.8 12/16/2021    DG Chest 2 View  Result Date: 03/19/2022 CLINICAL DATA:  Shortness of breath, hypertension EXAM: CHEST - 2 VIEW COMPARISON:  10/28/2020 FINDINGS: Cardiac size is within normal limits. Flattening of diaphragms may suggest COPD. There are no signs of pulmonary edema or focal pulmonary consolidation. Blunting of right lateral CP angle has not changed suggesting pleural thickening. Small linear densities in lateral aspect of left lower lung fields may suggest scarring. Surgical staples and linear densities in the right upper lung fields appear stable. There is previous surgical fusion in cervical spine. IMPRESSION: There are no new infiltrates or signs of pulmonary edema.  Electronically Signed   By: Elmer Picker M.D.   On: 03/19/2022 20:17   CT Head Wo Contrast  Result Date: 03/19/2022 CLINICAL DATA:  Headache, new or worsening (Age >= 50y) EXAM: CT HEAD WITHOUT CONTRAST TECHNIQUE: Contiguous axial images were obtained from the base of the skull through the vertex without intravenous contrast. RADIATION DOSE REDUCTION: This exam was performed according to the departmental dose-optimization program which includes automated exposure control, adjustment of the mA and/or kV according to patient size and/or use of iterative reconstruction technique. COMPARISON:  08/04/2021 FINDINGS: Brain: No evidence of acute infarction, hemorrhage, hydrocephalus, extra-axial collection or mass lesion/mass effect. Similar degree of cerebral volume loss. Vascular: No hyperdense vessel or unexpected calcification. Skull: Normal. Negative for fracture or focal lesion. Sinuses/Orbits: No acute finding. Other: None. IMPRESSION: No acute intracranial abnormality. Electronically Signed   By: Davina Poke D.O.   On: 03/19/2022 19:58    Assessment & Plan:   Problem List Items Addressed This Visit       Genitourinary   CKD (chronic kidney disease), stage III (Effort) - Primary    F/u w/ Dr Rita Ohara well      UTI (urinary tract infection)    Recurrent L flank pain, chronic dysuria Recent PET CT w/L nephrolithiasis        Other   Anxiety    Xanax as needed - increase dose due to tolerance  Potential benefits of a long term benzodiazepines  use as well as potential risks  and complications were explained to the patient and were aknowledged. Prozac and Remeron per Dr. Casimiro Needle  Flank pain    L flank Check UA/US renal      Relevant Orders   Urinalysis   US RENAL   Vitamin D deficiency    On Vit D         No orders of the defined types were placed in this encounter.     Follow-up: Return in about 3 months (around 09/13/2022) for a follow-up  visit.  Walker Kehr, MD

## 2022-06-14 NOTE — Assessment & Plan Note (Signed)
F/u w/ Dr Rita Ohara well

## 2022-06-15 ENCOUNTER — Other Ambulatory Visit: Payer: 59

## 2022-06-15 ENCOUNTER — Other Ambulatory Visit: Payer: Self-pay | Admitting: Internal Medicine

## 2022-06-15 ENCOUNTER — Telehealth: Payer: Self-pay | Admitting: Internal Medicine

## 2022-06-15 DIAGNOSIS — N3020 Other chronic cystitis without hematuria: Secondary | ICD-10-CM

## 2022-06-15 DIAGNOSIS — J45991 Cough variant asthma: Secondary | ICD-10-CM

## 2022-06-15 MED ORDER — CIPROFLOXACIN HCL 500 MG PO TABS: 500.0000 mg | ORAL_TABLET | Freq: Two times a day (BID) | ORAL | 0 refills | Status: DC

## 2022-06-15 NOTE — Telephone Encounter (Signed)
PT calls today post visit yesterday in regards to developing symptoms. PT experiencing possible bronchitis with heavy cough, chest tightness, SOB.  PT is requesting chest x-ray order be placed if possible.   Was not sure if these symptoms were related to PT's visit yesterday or not.  CB: [phone]

## 2022-06-15 NOTE — Telephone Encounter (Signed)
Okay chest x-ray.  Thanks

## 2022-06-16 NOTE — Telephone Encounter (Signed)
Notified pt w/MD response.../lmb 

## 2022-06-17 LAB — URINE CULTURE
MICRO NUMBER:: 14492881
SPECIMEN QUALITY:: ADEQUATE

## 2022-06-19 ENCOUNTER — Other Ambulatory Visit: Payer: Self-pay | Admitting: Internal Medicine

## 2022-06-22 ENCOUNTER — Encounter: Payer: Self-pay | Admitting: Gynecologic Oncology

## 2022-06-23 ENCOUNTER — Ambulatory Visit (INDEPENDENT_AMBULATORY_CARE_PROVIDER_SITE_OTHER): Payer: 59 | Admitting: Psychology

## 2022-06-23 DIAGNOSIS — F4323 Adjustment disorder with mixed anxiety and depressed mood: Secondary | ICD-10-CM | POA: Diagnosis not present

## 2022-06-23 NOTE — Progress Notes (Signed)
[name] Time: 4:10p-5:00 p50 minutes   [name] is a 72 y.o. 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 (Status: maintained) -- No Description Entered  Diminished interest in or enjoyment of activities. (Status: maintained) -- No Description Entered  Lack of energy. (Status: maintained) -- No Description Entered  Sad affect, social withdrawal, anxiety, loss of interest in activities, and low energy. (Status: maintained) -- No Description Entered  Medication Status compliance  Safety unspecified If Suicidal or Homicidal State Action Taken: unspecified  Current Risk:  Medications Buspar (Dosage: 30mg  2X/day)  New Medication (Dosage: 250mg  2x/day)  New Medication (Dosage: 250mg  2x/day)  Prozac (Dosage: 40mg  daily)  Remeron (Dosage: 30mg  nightly)  xanax (Dosage: .5mg  2-3X/day)  Objectives Related Problem: Recognize, accept, and cope with feelings of depression. Description: Identify and replace thoughts and beliefs that support depression. Target Date: 2022-10-28 Frequency: Daily Modality: individual Progress: 75%  Related Problem: Recognize, accept, and cope with feelings of depression. Description: Verbalize an understanding of healthy and unhealthy emotions with the intent of increasing the use of healthy emotions to guide actions. Target Date: 2022-10-28 Frequency: Daily Modality: individual Progress: 75%  Related Problem: Recognize, accept, and cope with feelings of depression. Description: Increasingly verbalize hopeful and positive statements regarding self, others, and the future. Target Date: 2022-04-28 Frequency: Daily Modality: individual Progress: 100%  Related Problem: Reduce fear, anxiety, and worry associated with the medical condition. Description: Share with  significant others efforts to adapt successfully to the medical condition/diagnosis. Target Date: 2022-10-28 Frequency: Daily Modality: individual Progress: 85%  Related Problem: Reduce fear, anxiety, and worry associated with the medical condition. Description: Engage in social, productive, and recreational activities that are possible in spite of medical condition. Target Date: 2022-10-28 Frequency: Daily Modality: individual Progress: 90%  Related Problem: Reduce fear, anxiety, and worry associated with the medical condition. Description: Identify the coping skills and sources of emotional support that have been beneficial in the past. Target Date: 2022-10-28 Frequency: Daily Modality: individual Progress: 75%  Client Response full compliance  Service Location Location, [name] [place], [place] [address]  Service Code cpt [phone]  Related past to present  Facilitate problem solving  Identify/label emotions  Validate/empathize  Self care activities  Lifestyle change (exercise, nutrition)  Assess/facilitate readiness to change  Relaxation training  Rationally challenge thoughts or beliefs/cognitive restructuring  Comments  Dx: F43.21  Meds: Prozac, [name], patient is struggling to rebuild her life and adjust to her new circumstances. She suffers a negative self-concept and has little confidence in her judgement. She is seeking to reduce symptoms of anxiety and self-doubt and to re-define satisfa ction moving forward. Therapy will focus on cognitive restructuring to redefine her experiences and focus on those satisfying aspects of her life. Will target this goal completion for April1, 2021 (completed). She is also wanting to utilize counseling to adjust and accept her latest diagnosis of cancer. She wants assistance to better manage this news and remain positive throughout treatment. Goal date is 12-22. Patient  has made progress, but her medical condition is variable and she requests additional treatment to manage moods throughout her medical ordeal. In addition, she is trying to manage feelings associated with her older son's mental health  struggles. Revised goal date is 6-24.  Patient agrees to have a video session. She is at home and I am in home office.   [name] says she is feeling bad because she "made a mess" and now has to clean it up. She says that she loves the cat she adopted and has trained him well. She subsequently went to doctor regarding her kidney stone, but was in terrible pain. She says because of her bi-polar and depression, she ordered "way too many cat toys". She now needs to return a large number of these toys. The cat was such a joy, she kept buying toys as a token of her great appreciation. She is upset with herself for putting herself in this position. Now that the cat has grown, he is becoming unmanageable. He is very destructive and wild. In addition, she is worried that if she has to go to the hospital the cat will be without a place to go. She found a home for the cat even though it made her very sad to part with him. She is also feeling sadness about [name] and his situation. She did call a previous treatment [facility] had attended. She told one of the director's bout [name] and he told her [name] could come back any time. When [name] told Steven's dad and father, they expressed concern that she had made this call. She felt misunderstood and tried to explain she was not back tracking. Feeling better now than she felt last week.                                                                                                                                                   Maygen Sirico Grant Time: 4:10p-5:00p 50 minutes

## 2022-06-24 ENCOUNTER — Telehealth: Payer: Self-pay

## 2022-06-24 ENCOUNTER — Inpatient Hospital Stay: Payer: 59 | Admitting: Gynecologic Oncology

## 2022-06-24 DIAGNOSIS — C55 Malignant neoplasm of uterus, part unspecified: Secondary | ICD-10-CM

## 2022-06-24 NOTE — Telephone Encounter (Signed)
Pt called office today stating she needs to reschedule appointment for this afternoon stating she has a migraine. She has been rescheduled to Dr.Tuckers next available which is 4/11.

## 2022-06-24 NOTE — Progress Notes (Unsigned)
Appt canceled by patient day of due to migraine.

## 2022-06-24 NOTE — Patient Instructions (Signed)
It was good to see you today.  I do not see or feel any evidence of cancer recurrence on your exam.  I will see you for follow-up in 3 months.  I will let you know results of your CT scan in March.  As always, if you develop any new and concerning symptoms before your next visit, please call to see me sooner.

## 2022-06-28 ENCOUNTER — Ambulatory Visit: Payer: Medicare Other | Admitting: Psychology

## 2022-07-06 ENCOUNTER — Telehealth (INDEPENDENT_AMBULATORY_CARE_PROVIDER_SITE_OTHER): Payer: 59 | Admitting: Internal Medicine

## 2022-07-06 ENCOUNTER — Other Ambulatory Visit: Payer: 59

## 2022-07-06 ENCOUNTER — Encounter: Payer: Self-pay | Admitting: Internal Medicine

## 2022-07-06 DIAGNOSIS — F4322 Adjustment disorder with anxiety: Secondary | ICD-10-CM

## 2022-07-06 DIAGNOSIS — F419 Anxiety disorder, unspecified: Secondary | ICD-10-CM | POA: Diagnosis not present

## 2022-07-06 MED ORDER — ALPRAZOLAM 1 MG PO TABS: 1.0000 mg | ORAL_TABLET | Freq: Three times a day (TID) | ORAL | 5 refills | Status: DC | PRN

## 2022-07-06 NOTE — Progress Notes (Signed)
Virtual Visit via Video Note  I connected with [name] on 07/06/22 at  1:20 PM EST by a video enabled telemedicine application and verified that I am speaking with the correct person using two identifiers.   I discussed the limitations of evaluation and management by telemedicine and the availability of in person appointments. The patient expressed understanding and agreed to proceed.  I was located at our [facility] office. The patient was at home. There was no one else present in the visit.  Poor connection.  No chief complaint on file.   Follow-up on anxiety.  [name] lost her prescription for alprazolam 1 mg.  She cannot find it anywhere.  She is very anxious, her blood pressure is up and she is having diarrhea.  History of Present Illness:    [name] lost her prescription for alprazolam 1 mg.  She cannot find it anywhere.  She is very anxious, her blood pressure is up and she is having diarrhea. Review of Systems  Constitutional:  Positive for diaphoresis. Negative for fever.  Cardiovascular:  Positive for palpitations.  Gastrointestinal:  Positive for diarrhea and nausea.  Neurological:  Negative for dizziness and seizures.  Psychiatric/Behavioral:  Negative for suicidal ideas. The patient is nervous/anxious.      Observations/Objective: The patient appears to be in no acute distress  Assessment and Plan:  Problem List Items Addressed This Visit       Other   Anxiety    The patient lost her alprazolam bottle.  Will restore her prescription today.      Relevant Medications   ALPRAZolam (XANAX) 1 MG tablet   Adjustment disorder with anxiety - Primary    The patient lost her alprazolam bottle.  Will restore her prescription today.        Meds ordered this encounter  Medications   ALPRAZolam (XANAX) 1 MG tablet    Sig: Take 1 tablet (1 mg total) by mouth 3 (three) times daily as needed for anxiety.    Dispense:  90 tablet    Refill:  5     The pt lost her Rx. OK to fill early (today).     Follow Up Instructions:    I discussed the assessment and treatment plan with the patient. The patient was provided an opportunity to ask questions and all were answered. The patient agreed with the plan and demonstrated an understanding of the instructions.   The patient was advised to call back or seek an in-person evaluation if the symptoms worsen or if the condition fails to improve as anticipated.  I provided face-to-face time during this encounter. We were at different locations.   Walker Kehr, MD

## 2022-07-06 NOTE — Assessment & Plan Note (Signed)
The patient lost her alprazolam bottle.  Will restore her prescription today.

## 2022-07-07 ENCOUNTER — Ambulatory Visit (INDEPENDENT_AMBULATORY_CARE_PROVIDER_SITE_OTHER): Payer: 59 | Admitting: Psychology

## 2022-07-07 DIAGNOSIS — F4323 Adjustment disorder with mixed anxiety and depressed mood: Secondary | ICD-10-CM

## 2022-07-07 NOTE — Progress Notes (Signed)
[name] Time: 4:10p-5:00 p50 minutes   [name] is a 72 y.o. female patient  Date: 07/07/2022  Treatment Plan Diagnosis F43.21 (Adjustment Disorder, With depressed mood) [n/a]  Symptoms A diagnosis of an acute, serious illness that is life-threatening. (Status: maintained) -- No Description Entered  Diminished interest in or enjoyment of activities. (Status: maintained) -- No Description Entered  Lack of energy. (Status: maintained) -- No Description Entered  Sad affect, social withdrawal, anxiety, loss of interest in activities, and low energy. (Status: maintained) -- No Description Entered  Medication Status compliance  Safety unspecified If Suicidal or Homicidal State Action Taken: unspecified  Current Risk:  Medications Buspar (Dosage: 30mg  2X/day)  New Medication (Dosage: 250mg  2x/day)  New Medication (Dosage: 250mg  2x/day)  Prozac (Dosage: 40mg  daily)  Remeron (Dosage: 30mg  nightly)  xanax (Dosage: .5mg  2-3X/day)  Objectives Related Problem: Recognize, accept, and cope with feelings of depression. Description: Identify and replace thoughts and beliefs that support depression. Target Date: 2022-10-28 Frequency: Daily Modality: individual Progress: 75%  Related Problem: Recognize, accept, and cope with feelings of depression. Description: Verbalize an understanding of healthy and unhealthy emotions with the intent of increasing the use of healthy emotions to guide actions. Target Date: 2022-10-28 Frequency: Daily Modality: individual Progress: 75%  Related Problem: Recognize, accept, and cope with feelings of depression. Description: Increasingly verbalize hopeful and positive statements regarding self, others, and the future. Target Date: 2022-04-28 Frequency: Daily Modality: individual Progress: 100%  Related Problem: Reduce fear, anxiety, and worry associated with the medical  condition. Description: Share with significant others efforts to adapt successfully to the medical condition/diagnosis. Target Date: 2022-10-28 Frequency: Daily Modality: individual Progress: 85%  Related Problem: Reduce fear, anxiety, and worry associated with the medical condition. Description: Engage in social, productive, and recreational activities that are possible in spite of medical condition. Target Date: 2022-10-28 Frequency: Daily Modality: individual Progress: 90%  Related Problem: Reduce fear, anxiety, and worry associated with the medical condition. Description: Identify the coping skills and sources of emotional support that have been beneficial in the past. Target Date: 2022-10-28 Frequency: Daily Modality: individual Progress: 75%  Client Response full compliance  Service Location Location, [name] [place], [place] [address]  Service Code cpt [phone]  Related past to present  Facilitate problem solving  Identify/label emotions  Validate/empathize  Self care activities  Lifestyle change (exercise, nutrition)  Assess/facilitate readiness to change  Relaxation training  Rationally challenge thoughts or beliefs/cognitive restructuring  Comments  Dx: F43.21  Meds: Prozac, [name], patient is struggling to rebuild her life and adjust to her new circumstances. She suffers a negative self-concept and has little confidence in her judgement. She is seeking to reduce symptoms of anxiety and self-doubt and to re-define satisfa ction moving forward. Therapy will focus on cognitive restructuring to redefine her experiences and focus on those satisfying aspects of her life. Will target this goal completion for April1, 2021 (completed). She is also wanting to utilize counseling to adjust and accept her latest diagnosis of cancer. She wants assistance to better manage this news and remain positive throughout  treatment. Goal date is 12-22. Patient has made progress, but her medical condition is variable and she requests additional treatment to manage moods throughout her medical ordeal.  In addition, she is trying to manage feelings associated with her older son's mental health struggles. Revised goal date is 6-24.  Patient agrees to have a video session. She is at home and I am in home office.   [name] says she "got worse" after her last session. She realized the middle of last week that she was not taking her Xanax as prescribed. She had missed putting them in her pill box. It had recently been increase from .5mg  to 1mg . She got the prescription renewed and is now feeling better. We talked about not "living in regret" which is often where she gets stuck. She is working on her gratitude and is hopeful that she will turn around. We talked bout staying on top of being positive and not dwelling on the negatives of the past.                                                                                                                                                        [name] Time: 4:10p-5:00p 50 minutes

## 2022-07-12 ENCOUNTER — Ambulatory Visit (INDEPENDENT_AMBULATORY_CARE_PROVIDER_SITE_OTHER): Payer: 59 | Admitting: Psychology

## 2022-07-12 DIAGNOSIS — F4323 Adjustment disorder with mixed anxiety and depressed mood: Secondary | ICD-10-CM

## 2022-07-12 NOTE — Progress Notes (Addendum)
[name]    [name] is a 72 y.o. female patient  Date: 07/12/2022  Treatment Plan Diagnosis F43.21 (Adjustment Disorder, With depressed mood) [n/a]  Symptoms A diagnosis of an acute, serious illness that is life-threatening. (Status: maintained) -- No Description Entered  Diminished interest in or enjoyment of activities. (Status: maintained) -- No Description Entered  Lack of energy. (Status: maintained) -- No Description Entered  Sad affect, social withdrawal, anxiety, loss of interest in activities, and low energy. (Status: maintained) -- No Description Entered  Medication Status compliance  Safety unspecified If Suicidal or Homicidal State Action Taken: unspecified  Current Risk:  Medications Buspar (Dosage: 30mg  2X/day)  New Medication (Dosage: 250mg  2x/day)  New Medication (Dosage: 250mg  2x/day)  Prozac (Dosage: 40mg  daily)  Remeron (Dosage: 30mg  nightly)  xanax (Dosage: .5mg  2-3X/day)  Objectives Related Problem: Recognize, accept, and cope with feelings of depression. Description: Identify and replace thoughts and beliefs that support depression. Target Date: 2022-10-28 Frequency: Daily Modality: individual Progress: 75%  Related Problem: Recognize, accept, and cope with feelings of depression. Description: Verbalize an understanding of healthy and unhealthy emotions with the intent of increasing the use of healthy emotions to guide actions. Target Date: 2022-10-28 Frequency: Daily Modality: individual Progress: 75%  Related Problem: Recognize, accept, and cope with feelings of depression. Description: Increasingly verbalize hopeful and positive statements regarding self, others, and the future. Target Date: 2022-04-28 Frequency: Daily Modality: individual Progress: 100%  Related Problem: Reduce fear, anxiety, and worry associated with the medical  condition. Description: Share with significant others efforts to adapt successfully to the medical condition/diagnosis. Target Date: 2022-10-28 Frequency: Daily Modality: individual Progress: 85%  Related Problem: Reduce fear, anxiety, and worry associated with the medical condition. Description: Engage in social, productive, and recreational activities that are possible in spite of medical condition. Target Date: 2022-10-28 Frequency: Daily Modality: individual Progress: 90%  Related Problem: Reduce fear, anxiety, and worry associated with the medical condition. Description: Identify the coping skills and sources of emotional support that have been beneficial in the past. Target Date: 2022-10-28 Frequency: Daily Modality: individual Progress: 75%  Client Response full compliance  Service Location Location, [name] [place], [place] [address]  Service Code cpt [phone]  Related past to present  Facilitate problem solving  Identify/label emotions  Validate/empathize  Self care activities  Lifestyle change (exercise, nutrition)  Assess/facilitate readiness to change  Relaxation training  Rationally challenge thoughts or beliefs/cognitive restructuring  Comments  Dx: F43.21  Meds: Prozac, [name], patient is struggling to rebuild her life and adjust to her new circumstances. She suffers a negative self-concept and has little confidence in her judgement. She is seeking to reduce symptoms of anxiety and self-doubt and to re-define satisfa ction moving forward. Therapy will focus on cognitive restructuring to redefine her experiences and focus on those satisfying aspects of her life. Will target this goal completion for April1, 2021 (completed). She is also wanting to utilize counseling to adjust and accept her latest diagnosis of cancer. She wants assistance to better manage this news and remain positive throughout  treatment. Goal date is 12-22. Patient has made progress, but her medical condition is variable  and she requests additional treatment to manage moods throughout her medical ordeal. In addition, she is trying to manage feelings associated with her older son's mental health struggles. Revised goal date is 6-24.  Patient agrees to have a video session. She is at home and I am in home office.   [name] says she is "not feeling well" today and says she is "worn out". Claims it does not feel like depression. Says it is more like fatigue and has not been able to accomplish anything today. She is overwhelmed and says she has a hard time being motivated. Unable to determine cause but we discussed getting on track tomorrow and expects positive results.                                                                                                                                                         [name] Time: 4:00p-4:30p 30 minutes

## 2022-07-17 ENCOUNTER — Other Ambulatory Visit: Payer: Self-pay | Admitting: Internal Medicine

## 2022-07-19 ENCOUNTER — Telehealth: Payer: Self-pay | Admitting: Internal Medicine

## 2022-07-19 DIAGNOSIS — Z79899 Other long term (current) drug therapy: Secondary | ICD-10-CM

## 2022-07-19 NOTE — Telephone Encounter (Signed)
MD is out of the office until Friday 07/23/22 pls advise.Marland KitchenJohny Chess

## 2022-07-19 NOTE — Telephone Encounter (Signed)
Lomotil was filled 07/03/22 should not really be due for refill. I do not see a past UDS would need this prior to refill. Methylphenidate was not filled since 03/18/23 is she taking anymore if so how is she taking this medication? She does have multiple high risk substances. Please call for UDS once collected can fill for short term.

## 2022-07-19 NOTE — Telephone Encounter (Signed)
Caller & Relationship to patient:  self   Call back number:[phone]   Date of last office visit:  06/2022   Date of next office visit:   Medication(s) to be refilled:  Methylphenidate '10mg'$ . And Diphenoxylate 2.5        Preferred Pharmacy:  [facility] on [address]

## 2022-07-20 NOTE — Telephone Encounter (Signed)
Called pt inform her MD response. Pt states she is coming tomorrow to do UDS. She states she take Methylphenidate every morning she have about 7 pills left .Marland KitchenJohny Chess

## 2022-07-21 ENCOUNTER — Ambulatory Visit (INDEPENDENT_AMBULATORY_CARE_PROVIDER_SITE_OTHER): Payer: 59 | Admitting: Psychology

## 2022-07-21 DIAGNOSIS — F4323 Adjustment disorder with mixed anxiety and depressed mood: Secondary | ICD-10-CM | POA: Diagnosis not present

## 2022-07-21 NOTE — Progress Notes (Addendum)
[name] Time:    [name] is a 72 y.o. female patient  Date: 07/21/2022  Treatment Plan Diagnosis F43.21 (Adjustment Disorder, With depressed mood) [n/a]  Symptoms A diagnosis of an acute, serious illness that is life-threatening. (Status: maintained) -- No Description Entered  Diminished interest in or enjoyment of activities. (Status: maintained) -- No Description Entered  Grant of energy. (Status: maintained) -- No Description Entered  Sad affect, social withdrawal, anxiety, loss of interest in activities, and low energy. (Status: maintained) -- No Description Entered  Medication Status compliance  Safety unspecified If Suicidal or Homicidal State Action Taken: unspecified  Current Risk:  Medications Buspar (Dosage: 30mg  2X/day)  New Medication (Dosage: 250mg  2x/day)  New Medication (Dosage: 250mg  2x/day)  Prozac (Dosage: 40mg  daily)  Remeron (Dosage: 30mg  nightly)  xanax (Dosage: .5mg  2-3X/day)  Objectives Related Problem: Recognize, accept, and cope with feelings of depression. Description: Identify and replace thoughts and beliefs that support depression. Target Date: 2022-10-28 Frequency: Daily Modality: individual Progress: 75%  Related Problem: Recognize, accept, and cope with feelings of depression. Description: Verbalize an understanding of healthy and unhealthy emotions with the intent of increasing the use of healthy emotions to guide actions. Target Date: 2022-10-28 Frequency: Daily Modality: individual Progress: 75%  Related Problem: Recognize, accept, and cope with feelings of depression. Description: Increasingly verbalize hopeful and positive statements regarding self, others, and the future. Target Date: 2022-04-28 Frequency: Daily Modality: individual Progress: 100%  Related Problem: Reduce fear, anxiety, and  worry associated with the medical condition. Description: Share with significant others efforts to adapt successfully to the medical condition/diagnosis. Target Date: 2022-10-28 Frequency: Daily Modality: individual Progress: 85%  Related Problem: Reduce fear, anxiety, and worry associated with the medical condition. Description: Engage in social, productive, and recreational activities that are possible in spite of medical condition. Target Date: 2022-10-28 Frequency: Daily Modality: individual Progress: 90%  Related Problem: Reduce fear, anxiety, and worry associated with the medical condition. Description: Identify the coping skills and sources of emotional support that have been beneficial in the past. Target Date: 2022-10-28 Frequency: Daily Modality: individual Progress: 75%  Client Response full compliance  Service Location Location, [name] [place], [place] [address]  Service Code cpt [phone]  Related past to present  Facilitate problem solving  Identify/label emotions  Validate/empathize  Self care activities  Lifestyle change (exercise, nutrition)  Assess/facilitate readiness to change  Relaxation training  Rationally challenge thoughts or beliefs/cognitive restructuring  Comments  Dx: F43.21  Meds: Prozac, [name], patient is struggling to rebuild her life and adjust to her new circumstances. She suffers a negative self-concept and has little confidence in her judgement. She is seeking to reduce symptoms of anxiety and self-doubt and to re-define satisfa ction moving forward. Therapy will focus on cognitive restructuring to redefine her experiences and focus on those satisfying aspects of her life. Will target this goal completion for April1, 2021 (completed). She is also wanting to utilize counseling to adjust and accept her latest diagnosis of cancer. She wants assistance to better manage this news  and remain positive  throughout treatment. Goal date is 12-22. Patient has made progress, but her medical condition is variable and she requests additional treatment to manage moods throughout her medical ordeal. In addition, she is trying to manage feelings associated with her older son's mental health struggles. Revised goal date is 6-24.  Patient agrees to have a video session. She is at home and I am in home office.   [name] shared some recent experiences with friends. She is very stimulated by the conversations she has with others. She is concerned that her son [name] is disappointed with his choice of profession (nursing). He is not pleased with his current job and is likely just fatigued and overwhelmed. She recognizes this and is able  to put this in perspective. [name] says that she is making some progress with tasks around the house. Also, says her church family continues to be extremely supportive. She is doing a good job toward self care. She exercises and does yoga every day.                                                                                                                                                            [name] Grant Time: 3:10p-4:00p 50 minutes

## 2022-07-23 ENCOUNTER — Encounter (HOSPITAL_COMMUNITY): Payer: Self-pay

## 2022-07-23 ENCOUNTER — Ambulatory Visit (HOSPITAL_COMMUNITY)
Admission: RE | Admit: 2022-07-23 | Discharge: 2022-07-23 | Disposition: A | Discharge status: Home / Self Care | Payer: 59 | Source: Ambulatory Visit | Attending: Gynecologic Oncology | Admitting: Gynecologic Oncology

## 2022-07-23 DIAGNOSIS — C499 Malignant neoplasm of connective and soft tissue, unspecified: Secondary | ICD-10-CM | POA: Diagnosis not present

## 2022-07-23 DIAGNOSIS — J984 Other disorders of lung: Secondary | ICD-10-CM | POA: Diagnosis not present

## 2022-07-23 DIAGNOSIS — C55 Malignant neoplasm of uterus, part unspecified: Secondary | ICD-10-CM | POA: Insufficient documentation

## 2022-07-23 DIAGNOSIS — N200 Calculus of kidney: Secondary | ICD-10-CM | POA: Diagnosis not present

## 2022-07-23 LAB — POCT I-STAT CREATININE: Creatinine, Ser: 1.3 mg/dL — ABNORMAL HIGH (ref 0.44–1.00)

## 2022-07-23 MED ORDER — IOHEXOL 300 MG/ML  SOLN
100.0000 mL | Freq: Once | INTRAMUSCULAR | Status: AC | PRN
  Administered 2022-07-23: 100 mL via INTRAVENOUS

## 2022-07-23 MED ORDER — IOHEXOL 9 MG/ML PO SOLN
ORAL | Status: AC
  Filled 2022-07-23: qty 1000

## 2022-07-23 MED ORDER — IOHEXOL 9 MG/ML PO SOLN
500.0000 mL | ORAL | Status: AC
  Administered 2022-07-23 (×2): 500 mL via ORAL

## 2022-07-23 MED ORDER — SODIUM CHLORIDE (PF) 0.9 % IJ SOLN
INTRAMUSCULAR | Status: AC
  Filled 2022-07-23: qty 50

## 2022-07-26 ENCOUNTER — Telehealth: Payer: Self-pay

## 2022-07-26 ENCOUNTER — Other Ambulatory Visit: Payer: 59

## 2022-07-26 ENCOUNTER — Ambulatory Visit (INDEPENDENT_AMBULATORY_CARE_PROVIDER_SITE_OTHER): Payer: 59 | Admitting: Psychology

## 2022-07-26 DIAGNOSIS — F4323 Adjustment disorder with mixed anxiety and depressed mood: Secondary | ICD-10-CM

## 2022-07-26 MED ORDER — METHYLPHENIDATE HCL 10 MG PO TABS: 10.0000 mg | ORAL_TABLET | Freq: Every morning | ORAL | 0 refills | Status: DC

## 2022-07-26 NOTE — Telephone Encounter (Signed)
Pt is aware of CT results as reported by Joylene John NP.  Pt will keep upcoming appointment with Dr.Tucker on Thursday 3/14 to discuss plan.   CT report faxed to PCP Dr. Alain Marion

## 2022-07-26 NOTE — Telephone Encounter (Signed)
-----   Message from Dorothyann Gibbs, NP sent at 07/26/2022  8:27 AM EDT ----- Please let the patient know there are no acute findings on CT scan. No evidence of metastatic disease. There is a small 4 mm kidney stone in the left kidney. This is not blocking anything. They note a moderate stool burden so would recommend taking something for constipation (miralax, sennakot etc). 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   Dr. Berline Lopes will be back later in the week and will review as well. She will be notified of any recommendations if any and plan to follow up as scheduled.

## 2022-07-26 NOTE — Telephone Encounter (Signed)
Done UDS pending Thx

## 2022-07-26 NOTE — Progress Notes (Signed)
[name] Time: 4:10p-5:00 p50 minutes   [name] is a 72 y.o. 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 (Status: maintained) -- No Description Entered  Diminished interest in or enjoyment of activities. (Status: maintained) -- No Description Entered  Lack of energy. (Status: maintained) -- No Description Entered  Sad affect, social withdrawal, anxiety, loss of interest in activities, and low energy. (Status: maintained) -- No Description Entered  Medication Status compliance  Safety unspecified If Suicidal or Homicidal State Action Taken: unspecified  Current Risk:  Medications Buspar (Dosage: '30mg'$  2X/day)  New Medication (Dosage: '250mg'$  2x/day)  New Medication (Dosage: '250mg'$  2x/day)  Prozac (Dosage: '40mg'$  daily)  Remeron (Dosage: '30mg'$  nightly)  xanax (Dosage: .'5mg'$  2-3X/day)  Objectives Related Problem: Recognize, accept, and cope with feelings of depression. Description: Identify and replace thoughts and beliefs that support depression. Target Date: 2022-10-28 Frequency: Daily Modality: individual Progress: 75%  Related Problem: Recognize, accept, and cope with feelings of depression. Description: Verbalize an understanding of healthy and unhealthy emotions with the intent of increasing the use of healthy emotions to guide actions. Target Date: 2022-10-28 Frequency: Daily Modality: individual Progress: 75%  Related Problem: Recognize, accept, and cope with feelings of depression. Description: Increasingly verbalize hopeful and positive statements regarding self, others, and the future. Target Date: 2022-04-28 Frequency: Daily Modality: individual Progress:  100%  Related Problem: Reduce fear, anxiety, and worry associated with the medical condition. Description: Share with significant others efforts to adapt successfully to the medical condition/diagnosis. Target Date: 2022-10-28 Frequency: Daily Modality: individual Progress: 85%  Related Problem: Reduce fear, anxiety, and worry associated with the medical condition. Description: Engage in social, productive, and recreational activities that are possible in spite of medical condition. Target Date: 2022-10-28 Frequency: Daily Modality: individual Progress: 90%  Related Problem: Reduce fear, anxiety, and worry associated with the medical condition. Description: Identify the coping skills and sources of emotional support that have been beneficial in the past. Target Date: 2022-10-28 Frequency: Daily Modality: individual Progress: 75%  Client Response full compliance  Service Location Location, [name] [place], [place] [address]  Service Code cpt [phone]  Related past to present  Facilitate problem solving  Identify/label emotions  Validate/empathize  Self care activities  Lifestyle change (exercise, nutrition)  Assess/facilitate readiness to change  Relaxation training  Rationally challenge thoughts or beliefs/cognitive restructuring  Comments  Dx: F43.21  Meds: Prozac, [name], patient is struggling to rebuild her life and adjust to her new circumstances. She suffers a negative self-concept and has little confidence in her judgement. She is seeking to reduce symptoms of anxiety and self-doubt and to re-define satisfa ction moving forward. Therapy will focus on cognitive restructuring to redefine her experiences and focus on those satisfying aspects of her life. Will target this goal completion for April1, 2021 (completed). She is also wanting to utilize counseling to adjust and accept  her latest diagnosis of  cancer. She wants assistance to better manage this news and remain positive throughout treatment. Goal date is 12-22. Patient has made progress, but her medical condition is variable and she requests additional treatment to manage moods throughout her medical ordeal. In addition, she is trying to manage feelings associated with her older son's mental health struggles. Revised goal date is 6-24.  Patient agrees to have a video session. She is at home and I am in home office.   [name] says she thought she was doing okay until this morning. She says she was confused this morning and got very depressed. Made her feel like she "can't even take care of herself". Says that she can't keep up with her personal finances. She says she has never been good with finances, but recognizes she needs to get everything in order. In addition to all of the financial struggles, she says she is having many regrets for bad decisions in her life. She claims she has "done more good than bad", but has considerable guilt for bad choices and feels like a failure. "I thought I was doing the right thing, but made the same mistakes multiple times". Feels responsible for the struggles that her sons are having.                                                                                                                                                                  [name] Time: 4:10p-5:00p 50 minutes

## 2022-07-26 NOTE — Addendum Note (Signed)
Addended by: Cassandria Anger on: 07/26/2022 07:36 AM   Modules accepted: Orders

## 2022-07-27 ENCOUNTER — Telehealth: Payer: Self-pay | Admitting: *Deleted

## 2022-07-27 ENCOUNTER — Other Ambulatory Visit: Payer: 59

## 2022-07-27 DIAGNOSIS — Z79899 Other long term (current) drug therapy: Secondary | ICD-10-CM

## 2022-07-27 NOTE — Telephone Encounter (Signed)
Spoke with the patient and moved appt from 3/14 to 3/15

## 2022-07-29 ENCOUNTER — Inpatient Hospital Stay: Payer: 59 | Admitting: Gynecologic Oncology

## 2022-07-29 LAB — DRUG MONITORING PANEL 376104, URINE
Alphahydroxyalprazolam: 203 ng/mL — ABNORMAL HIGH (ref <25)
Alphahydroxymidazolam: NEGATIVE ng/mL (ref <50)
Alphahydroxytriazolam: NEGATIVE ng/mL (ref <50)
Aminoclonazepam: NEGATIVE ng/mL (ref <25)
Amphetamines: NEGATIVE ng/mL (ref <500)
Barbiturates: NEGATIVE ng/mL (ref <300)
Benzodiazepines: POSITIVE ng/mL — AB (ref <100)
Cocaine Metabolite: NEGATIVE ng/mL (ref <150)
Desmethyltramadol: NEGATIVE ng/mL (ref <100)
Hydroxyethylflurazepam: NEGATIVE ng/mL (ref <50)
Lorazepam: NEGATIVE ng/mL (ref <50)
Nordiazepam: NEGATIVE ng/mL (ref <50)
Opiates: NEGATIVE ng/mL (ref <100)
Oxazepam: NEGATIVE ng/mL (ref <50)
Oxycodone: NEGATIVE ng/mL (ref <100)
Temazepam: NEGATIVE ng/mL (ref <50)
Tramadol: NEGATIVE ng/mL (ref <100)

## 2022-07-29 LAB — DM TEMPLATE

## 2022-07-30 ENCOUNTER — Other Ambulatory Visit: Payer: Self-pay

## 2022-07-30 ENCOUNTER — Encounter: Payer: Self-pay | Admitting: Gynecologic Oncology

## 2022-07-30 ENCOUNTER — Inpatient Hospital Stay: Payer: 59 | Attending: Gynecologic Oncology | Admitting: Gynecologic Oncology

## 2022-07-30 VITALS — BP 146/92 | HR 66 | Temp 97.8°F | Resp 16 | Ht 62.0 in | Wt 157.0 lb

## 2022-07-30 DIAGNOSIS — Z923 Personal history of irradiation: Secondary | ICD-10-CM | POA: Diagnosis not present

## 2022-07-30 DIAGNOSIS — Z9221 Personal history of antineoplastic chemotherapy: Secondary | ICD-10-CM | POA: Diagnosis not present

## 2022-07-30 DIAGNOSIS — Z8542 Personal history of malignant neoplasm of other parts of uterus: Secondary | ICD-10-CM | POA: Diagnosis present

## 2022-07-30 DIAGNOSIS — C55 Malignant neoplasm of uterus, part unspecified: Secondary | ICD-10-CM

## 2022-07-30 NOTE — Progress Notes (Signed)
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 NAUSEA AND VOMITING, Disp: , Rfl:    OVER THE COUNTER MEDICATION, Take 1 capsule by mouth daily. 7 MUSHROOM BLEND, Disp: , Rfl:    Polyethyl Glycol-Propyl Glycol (SYSTANE OP), Place 1 drop into both eyes 2 (two) times daily as needed (dry eyes). , Disp: , Rfl:    SUNSCREEN SPF30 EX, , Disp: , Rfl:    traMADol (ULTRAM) 50 MG tablet, Take 1 tablet (50 mg total) by mouth every 6 (six) hours as needed for moderate pain., Disp: 100 tablet, Rfl: 1   vitamin B-12 (CYANOCOBALAMIN) 1000 MCG tablet, Take 1,000 mcg by mouth daily., Disp: , Rfl:    vitamin C (ASCORBIC ACID) 500 MG tablet, Take 500 mg by mouth daily., Disp: , Rfl:   Review of Systems: + Anxiety, depression, confusion, decreased concentration. Denies appetite changes, fevers, chills, fatigue, unexplained weight changes. Denies hearing loss, neck lumps or masses, mouth sores, ringing in ears or voice changes. Denies cough or wheezing.  Denies shortness of breath. Denies chest pain or palpitations. Denies leg swelling. Denies abdominal distention, pain, blood in stools,  constipation, diarrhea, nausea, vomiting, or early satiety. Denies pain with intercourse, dysuria, frequency, hematuria or incontinence. Denies hot flashes, pelvic pain, vaginal bleeding or vaginal discharge.   Denies joint pain, back pain or muscle pain/cramps. Denies itching, rash, or wounds. Denies dizziness, headaches, numbness or seizures. Denies swollen lymph nodes or glands, denies easy bruising or bleeding.  Physical Exam: BP (!) 146/92   Pulse 66   Temp 97.8 F (36.6 C) (Oral)   Resp 16   Ht 5\' 2"  (1.575 m)   Wt 157 lb (71.2 kg)   SpO2 100%   BMI 28.72 kg/m  General: Alert, oriented, no acute distress. HEENT: Normocephalic, atraumatic, sclera anicteric. Chest: Clear to auscultation bilaterally.  No wheezes or rhonchi. Cardiovascular: Regular rate and rhythm, no murmurs. Abdomen: soft, nontender.  Normoactive bowel sounds.  No masses or hepatosplenomegaly appreciated.  Well-healed incisions. Extremities: Grossly normal range of motion.  Warm, well perfused.  No edema bilaterally. Skin: No rashes or lesions noted. Lymphatics: No cervical, supraclavicular, or inguinal adenopathy. GU: Normal appearing external genitalia without erythema, excoriation, or lesions.  Speculum exam reveals mildly atrophic vaginal mucosa, no bleeding or discharge, no masses.  Bimanual exam reveals cuff smooth, no nodularity or masses.  Rectovaginal exam confirms these findings.  Laboratory & Radiologic Studies: CT A/P on 3/10: 1. No acute intrathoracic, abdominal, or pelvic pathology. No evidence of metastatic disease in the chest, abdomen, or pelvis. 2. A 4 mm nonobstructing left renal inferior pole calculus. No hydronephrosis. 3. Moderate colonic stool burden. No bowel obstruction. Normal appendix. 4.  Aortic Atherosclerosis (ICD10-I70.0).  Assessment & Plan: [name] is a 71 y.o. woman with history of recurrent LMS (most recent oligometastatic recurrence resected with negative  margins in 07/2020) who presents for follow-up.   Patient is NED on exam today. Overall doing very well.   I will plan to see her for follow-up in 3 months.  We will plan for repeat imaging 6 months after her recent CT.  I have asked her to call with any new or concerning symptoms before her next visit.  25 minutes  of total time was spent for this patient encounter, including preparation, face-to-face counseling with the patient and coordination of care, and documentation of the encounter.  Jeral Pinch, MD  Division of Gynecologic Oncology  Department of Obstetrics and Gynecology  [facility]

## 2022-07-30 NOTE — Patient Instructions (Signed)
It was good to see you today.  I do not see or feel any evidence of cancer recurrence on your exam.  I will see you for follow-up in 3 months.  As always, if you develop any new and concerning symptoms before your next visit, please call to see me sooner.  

## 2022-08-01 ENCOUNTER — Other Ambulatory Visit: Payer: Self-pay | Admitting: Internal Medicine

## 2022-08-01 DIAGNOSIS — R197 Diarrhea, unspecified: Secondary | ICD-10-CM

## 2022-08-04 ENCOUNTER — Ambulatory Visit (INDEPENDENT_AMBULATORY_CARE_PROVIDER_SITE_OTHER): Payer: 59 | Admitting: Psychology

## 2022-08-04 DIAGNOSIS — F4323 Adjustment disorder with mixed anxiety and depressed mood: Secondary | ICD-10-CM

## 2022-08-04 NOTE — Progress Notes (Signed)
[name] Time: 4:10p-5:00 p50 minutes   [name] is a 72 y.o. 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 (Status: maintained) -- No Description Entered  Diminished interest in or enjoyment of activities. (Status: maintained) -- No Description Entered  Lack of energy. (Status: maintained) -- No Description Entered  Sad affect, social withdrawal, anxiety, loss of interest in activities, and low energy. (Status: maintained) -- No Description Entered  Medication Status compliance  Safety unspecified If Suicidal or Homicidal State Action Taken: unspecified  Current Risk:  Medications Buspar (Dosage: 30mg  2X/day)  New Medication (Dosage: 250mg  2x/day)  New Medication (Dosage: 250mg  2x/day)  Prozac (Dosage: 40mg  daily)  Remeron (Dosage: 30mg  nightly)  xanax (Dosage: .5mg  2-3X/day)  Objectives Related Problem: Recognize, accept, and cope with feelings of depression. Description: Identify and replace thoughts and beliefs that support depression. Target Date: 2022-10-28 Frequency: Daily Modality: individual Progress: 75%  Related Problem: Recognize, accept, and cope with feelings of depression. Description: Verbalize an understanding of healthy and unhealthy emotions with the intent of increasing the use of healthy emotions to guide actions. Target Date: 2022-10-28 Frequency: Daily Modality: individual Progress: 75%  Related Problem: Recognize, accept, and cope with feelings of depression. Description: Increasingly verbalize hopeful and positive statements regarding self, others, and the future. Target Date: 2022-04-28 Frequency: Daily Modality: individual Progress: 100%  Related Problem: Reduce fear, anxiety, and worry associated with the medical condition. Description: Share with significant others efforts  to adapt successfully to the medical condition/diagnosis. Target Date: 2022-10-28 Frequency: Daily Modality: individual Progress: 85%  Related Problem: Reduce fear, anxiety, and worry associated with the medical condition. Description: Engage in social, productive, and recreational activities that are possible in spite of medical condition. Target Date: 2022-10-28 Frequency: Daily Modality: individual Progress: 90%  Related Problem: Reduce fear, anxiety, and worry associated with the medical condition. Description: Identify the coping skills and sources of emotional support that have been beneficial in the past. Target Date: 2022-10-28 Frequency: Daily Modality: individual Progress: 75%  Client Response full compliance  Service Location Location, [name] [place], [place] [address]  Service Code cpt [id number]  Related past to present  Facilitate problem solving  Identify/label emotions  Validate/empathize  Self care activities  Lifestyle change (exercise, nutrition)  Assess/facilitate readiness to change  Relaxation training  Rationally challenge thoughts or beliefs/cognitive restructuring  Comments  Dx: F43.21  Meds: Prozac, [name], patient is struggling to rebuild her life and adjust to her new circumstances. She suffers a negative self-concept and has little confidence in her judgement. She is seeking to reduce symptoms of anxiety and self-doubt and to re-define satisfa ction moving forward. Therapy will focus on cognitive restructuring to redefine her experiences and focus on those satisfying aspects of her life. Will target this goal completion for April1, 2021 (completed). She is also wanting to utilize counseling to adjust and accept her latest diagnosis of cancer. She wants assistance to better manage this news and remain positive throughout treatment. Goal date is 12-22. Patient has made progress, but her  medical condition is variable and she requests additional treatment to manage moods throughout her medical ordeal. In addition, she is trying to manage feelings associated with her older son's mental health struggles. Revised goal date is 6-24.  Patient agrees to have a video session. She  is at home and I am in home office.   [name] has been feeling physically bad all day. 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 Cannot get in to an endocrinologist until August. She talked extensively about her physical challenges.  She continues to express some distress about her marital history. She talked about some of her "poor decisions" that contributed to the difficulties in her domestic life (marriage and kids). She wants to see a cardiologist who is aware of dietary issues.                                                                                                                                                                         [name] Grant Time: 4:10p-5:00p 50 minutes

## 2022-08-09 ENCOUNTER — Ambulatory Visit (INDEPENDENT_AMBULATORY_CARE_PROVIDER_SITE_OTHER): Payer: 59 | Admitting: Psychology

## 2022-08-09 DIAGNOSIS — F4323 Adjustment disorder with mixed anxiety and depressed mood: Secondary | ICD-10-CM | POA: Diagnosis not present

## 2022-08-09 NOTE — Progress Notes (Signed)
[name] Time: 4:10p-5:00 p50 minutes   [name] is a 72 y.o. female patient  Date: 08/09/2022  Treatment Plan Diagnosis F43.21 (Adjustment Disorder, With depressed mood) [n/a]  Symptoms A diagnosis of an acute, serious illness that is life-threatening. (Status: maintained) -- No Description Entered  Diminished interest in or enjoyment of activities. (Status: maintained) -- No Description Entered  Lack of energy. (Status: maintained) -- No Description Entered  Sad affect, social withdrawal, anxiety, loss of interest in activities, and low energy. (Status: maintained) -- No Description Entered  Medication Status compliance  Safety unspecified If Suicidal or Homicidal State Action Taken: unspecified  Current Risk:  Medications Buspar (Dosage: 30mg  2X/day)  New Medication (Dosage: 250mg  2x/day)  New Medication (Dosage: 250mg  2x/day)  Prozac (Dosage: 40mg  daily)  Remeron (Dosage: 30mg  nightly)  xanax (Dosage: .5mg  2-3X/day)  Objectives Related Problem: Recognize, accept, and cope with feelings of depression. Description: Identify and replace thoughts and beliefs that support depression. Target Date: 2022-10-28 Frequency: Daily Modality: individual Progress: 75%  Related Problem: Recognize, accept, and cope with feelings of depression. Description: Verbalize an understanding of healthy and unhealthy emotions with the intent of increasing the use of healthy emotions to guide actions. Target Date: 2022-10-28 Frequency: Daily Modality: individual Progress: 75%  Related Problem: Recognize, accept, and cope with feelings of depression. Description: Increasingly verbalize hopeful and positive statements regarding self, others, and the future. Target Date: 2022-04-28 Frequency: Daily Modality: individual Progress: 100%  Related Problem: Reduce fear, anxiety, and worry associated with the medical condition. Description: Share  with significant others efforts to adapt successfully to the medical condition/diagnosis. Target Date: 2022-10-28 Frequency: Daily Modality: individual Progress: 85%  Related Problem: Reduce fear, anxiety, and worry associated with the medical condition. Description: Engage in social, productive, and recreational activities that are possible in spite of medical condition. Target Date: 2022-10-28 Frequency: Daily Modality: individual Progress: 90%  Related Problem: Reduce fear, anxiety, and worry associated with the medical condition. Description: Identify the coping skills and sources of emotional support that have been beneficial in the past. Target Date: 2022-10-28 Frequency: Daily Modality: individual Progress: 75%  Client Response full compliance  Service Location Location, [name] [place], [place] [address]  Service Code cpt [id number]  Related past to present  Facilitate problem solving  Identify/label emotions  Validate/empathize  Self care activities  Lifestyle change (exercise, nutrition)  Assess/facilitate readiness to change  Relaxation training  Rationally challenge thoughts or beliefs/cognitive restructuring  Comments  Dx: F43.21  Meds: Prozac, [name], patient is struggling to rebuild her life and adjust to her new circumstances. She suffers a negative self-concept and has little confidence in her judgement. She is seeking to reduce symptoms of anxiety and self-doubt and to re-define satisfa ction moving forward. Therapy will focus on cognitive restructuring to redefine her experiences and focus on those satisfying aspects of her life. Will target this goal completion for April1, 2021 (completed). She is also wanting to utilize counseling to adjust and accept her latest diagnosis of cancer. She wants assistance to better manage this news and remain positive throughout treatment. Goal date is 12-22.  Patient has made progress, but her medical condition is variable and she requests additional treatment to manage moods throughout her medical ordeal. In addition, she is trying to manage feelings associated with her older son's mental health  struggles. Revised goal date is 6-24.  Patient agrees to have a video session. She is at home and I am in home office.   [name] is "fighting kidney stones" again. She is hoping to avoid surgery. She reflected on her own history in her [place] and work. She has very fond memories and it helps her feel more positive and good to remember these "old times". Today is her mother's 95th birthday. [name] cannot go today as she planned because of her kidney stone. We talked about her self care it make sure her stone does not become debilitating. She talked about being on Xanax for almost 50 years and her concerns about being addicted and the possible relationship to dementia.                                                                                                                                                                            [name] Time: 4:10p-5:00p 50 minutes

## 2022-08-12 ENCOUNTER — Telehealth: Payer: 59 | Admitting: Internal Medicine

## 2022-08-16 ENCOUNTER — Other Ambulatory Visit (INDEPENDENT_AMBULATORY_CARE_PROVIDER_SITE_OTHER): Payer: 59

## 2022-08-16 ENCOUNTER — Encounter: Payer: Self-pay | Admitting: Internal Medicine

## 2022-08-16 ENCOUNTER — Ambulatory Visit (INDEPENDENT_AMBULATORY_CARE_PROVIDER_SITE_OTHER): Payer: 59 | Admitting: Internal Medicine

## 2022-08-16 VITALS — BP 124/80 | HR 67 | Temp 97.9°F | Ht 62.0 in | Wt 158.0 lb

## 2022-08-16 DIAGNOSIS — R202 Paresthesia of skin: Secondary | ICD-10-CM

## 2022-08-16 DIAGNOSIS — R296 Repeated falls: Secondary | ICD-10-CM

## 2022-08-16 DIAGNOSIS — D351 Benign neoplasm of parathyroid gland: Secondary | ICD-10-CM

## 2022-08-16 DIAGNOSIS — R270 Ataxia, unspecified: Secondary | ICD-10-CM | POA: Insufficient documentation

## 2022-08-16 DIAGNOSIS — E559 Vitamin D deficiency, unspecified: Secondary | ICD-10-CM | POA: Diagnosis not present

## 2022-08-16 DIAGNOSIS — F419 Anxiety disorder, unspecified: Secondary | ICD-10-CM

## 2022-08-16 LAB — COMPREHENSIVE METABOLIC PANEL
ALT: 25 U/L (ref 0–35)
AST: 30 U/L (ref 0–37)
Albumin: 4.5 g/dL (ref 3.5–5.2)
Alkaline Phosphatase: 142 U/L — ABNORMAL HIGH (ref 39–117)
BUN: 28 mg/dL — ABNORMAL HIGH (ref 6–23)
CO2: 28 mEq/L (ref 19–32)
Calcium: 9.4 mg/dL (ref 8.4–10.5)
Chloride: 101 mEq/L (ref 96–112)
Creatinine, Ser: 1.39 mg/dL — ABNORMAL HIGH (ref 0.40–1.20)
GFR: 37.89 mL/min — ABNORMAL LOW (ref >60.00)
Glucose, Bld: 83 mg/dL (ref 70–99)
Potassium: 4 mEq/L (ref 3.5–5.1)
Sodium: 136 mEq/L (ref 135–145)
Total Bilirubin: 0.3 mg/dL (ref 0.2–1.2)
Total Protein: 7.6 g/dL (ref 6.0–8.3)

## 2022-08-16 NOTE — Assessment & Plan Note (Addendum)
Likely Rx related Hold Hydralazine  Recent head CT 03/2022 WNL

## 2022-08-16 NOTE — Assessment & Plan Note (Signed)
Endo appt - pending in 12/2022 Check labs

## 2022-08-16 NOTE — Progress Notes (Signed)
Subjective:  Patient ID: [name], female    DOB: 07-Oct-1950  Age: 72 y.o. MRN: [id number]  CC: Medical Management of Chronic Issues   HPI [name] presents for vertigo - worse. Pt fell 3 times (no LOC) when was walking. Walking like a drunk at times... BP is erratic C/o anxiety  Outpatient Medications Prior to Visit  Medication Sig Dispense Refill   acetaminophen (TYLENOL) 325 MG tablet Take 2 tablets (650 mg total) by mouth every 6 (six) hours as needed for moderate pain. 90 tablet 2   ALPRAZolam (XANAX) 1 MG tablet Take 1 tablet (1 mg total) by mouth 3 (three) times daily as needed for anxiety. 90 tablet 5   aspirin EC 81 MG tablet Take 1 tablet (81 mg total) by mouth daily. 30 tablet 3   busPIRone (BUSPAR) 30 MG tablet TAKE 1 TABLET BY MOUTH TWICE  DAILY 200 tablet 1   Cholecalciferol (D3 5000) 125 MCG (5000 UT) capsule Take 5,000 Units by mouth daily.     ciprofloxacin (CIPRO) 500 MG tablet Take 1 tablet (500 mg total) by mouth 2 (two) times daily. for 10 days 20 tablet 0   colesevelam (WELCHOL) 625 MG tablet Take 2 tablets (1,250 mg total) by mouth 2 (two) times daily with a meal. 360 tablet 3   D-Mannose 500 MG CAPS Take 500 mg by mouth 2 (two) times daily. WITH CRANBERRY & DANDELION EXTRACT 180 capsule 3   diphenoxylate-atropine (LOMOTIL) 2.5-0.025 MG tablet TAKE 1 OR 2 TABLETS BY MOUTH 4 TIMES A DAY AS NEEDED FOR DIARRHEA/LOSSE STOOLS 60 tablet 1   esomeprazole (NEXIUM) 40 MG capsule TAKE 1 CAPSULE (40 MG TOTAL) BY MOUTH EVERY MORNING. 90 capsule 1   FLUoxetine (PROZAC) 40 MG capsule TAKE 1 CAPSULE BY MOUTH IN THE  MORNING 90 capsule 3   furosemide (LASIX) 20 MG tablet TAKE 1 OR 2 TABLETS BY MOUTH EVERY DAY AS NEEDED FOR EDEMA AS DIRECTED 200 tablet 0   hydrALAZINE (APRESOLINE) 10 MG tablet Take 1 tablet (10 mg total) by mouth 4 (four) times daily as needed. 100 tablet 3   ipratropium-albuterol (DUONEB) 0.5-2.5 (3) MG/3ML SOLN Take 3 mLs by nebulization  every 6 (six) hours as needed (Asthma).     iron polysaccharides (FERREX 150) 150 MG capsule Take 1 capsule (150 mg total) by mouth 2 (two) times daily. 180 capsule 3   levothyroxine (SYNTHROID) 100 MCG tablet Take 1 tablet (100 mcg total) by mouth daily before breakfast. 90 tablet 3   loperamide (IMODIUM A-D) 2 MG tablet Take 2 mg by mouth 4 (four) times daily as needed for diarrhea or loose stools.     METAMUCIL FIBER PO Take 1 capsule by mouth in the morning and at bedtime.     methylphenidate (RITALIN) 10 MG tablet Take 1 tablet (10 mg total) by mouth in the morning. 90 tablet 0   mirtazapine (REMERON) 30 MG tablet TAKE 1 TABLET BY MOUTH AT  BEDTIME 90 tablet 3   nebivolol (BYSTOLIC) 10 MG tablet Take 1 tablet (10 mg total) by mouth daily. 90 tablet 3   omega-3 acid ethyl esters (LOVAZA) 1 g capsule TAKE 2 CAPSULES BY MOUTH 2 TIMES DAILY. 360 capsule 1   ondansetron (ZOFRAN-ODT) 4 MG disintegrating tablet TAKE 1 TABLET BY MOUTH EVERY 8 HOURS AS NEEDED FOR NAUSEA AND VOMITING     OVER THE COUNTER MEDICATION Take 1 capsule by mouth daily. 7 MUSHROOM BLEND     Polyethyl Glycol-Propyl  Glycol (SYSTANE OP) Place 1 drop into both eyes 2 (two) times daily as needed (dry eyes).      SUNSCREEN SPF30 EX      traMADol (ULTRAM) 50 MG tablet Take 1 tablet (50 mg total) by mouth every 6 (six) hours as needed for moderate pain. 100 tablet 1   vitamin B-12 (CYANOCOBALAMIN) 1000 MCG tablet Take 1,000 mcg by mouth daily.     vitamin C (ASCORBIC ACID) 500 MG tablet Take 500 mg by mouth daily.     No facility-administered medications prior to visit.    ROS: Review of Systems  Constitutional:  Positive for fatigue. Negative for activity change, appetite change, chills and unexpected weight change.  HENT:  Negative for congestion, mouth sores and sinus pressure.   Eyes:  Negative for visual disturbance.  Respiratory:  Negative for cough and chest tightness.   Gastrointestinal:  Negative for abdominal pain and  nausea.  Genitourinary:  Negative for difficulty urinating, frequency and vaginal pain.  Musculoskeletal:  Negative for back pain and gait problem.  Skin:  Negative for pallor and rash.  Neurological:  Positive for weakness. Negative for dizziness, tremors, numbness and headaches.  Psychiatric/Behavioral:  Positive for dysphoric mood. Negative for confusion, sleep disturbance and suicidal ideas. The patient is nervous/anxious.     Objective:  BP 124/80 (BP Location: Left Arm, Patient Position: Sitting, Cuff Size: Normal)   Pulse 67   Temp 97.9 F (36.6 C) (Oral)   Ht 5\' 2"  (1.575 m)   Wt 158 lb (71.7 kg)   SpO2 97%   BMI 28.90 kg/m   BP Readings from Last 3 Encounters:  08/16/22 124/80  07/30/22 (!) 146/92  06/14/22 138/80    Wt Readings from Last 3 Encounters:  08/16/22 158 lb (71.7 kg)  07/30/22 157 lb (71.2 kg)  06/14/22 158 lb (71.7 kg)    Physical Exam Constitutional:      General: She is not in acute distress.    Appearance: Normal appearance. She is well-developed.  HENT:     Head: Normocephalic.     Right Ear: External ear normal.     Left Ear: External ear normal.     Nose: Nose normal.  Eyes:     General:        Right eye: No discharge.        Left eye: No discharge.     Conjunctiva/sclera: Conjunctivae normal.     Pupils: Pupils are equal, round, and reactive to light.  Neck:     Thyroid: No thyromegaly.     Vascular: No JVD.     Trachea: No tracheal deviation.  Cardiovascular:     Rate and Rhythm: Normal rate and regular rhythm.     Heart sounds: Normal heart sounds.  Pulmonary:     Effort: No respiratory distress.     Breath sounds: No stridor. No wheezing.  Abdominal:     General: Bowel sounds are normal. There is no distension.     Palpations: Abdomen is soft. There is no mass.     Tenderness: There is no abdominal tenderness. There is no guarding or rebound.  Musculoskeletal:        General: Tenderness present.     Cervical back: Normal  range of motion and neck supple. No rigidity.  Lymphadenopathy:     Cervical: No cervical adenopathy.  Skin:    Findings: No erythema or rash.  Neurological:     Cranial Nerves: No cranial nerve deficit.  Motor: No abnormal muscle tone.     Coordination: Coordination normal.     Deep Tendon Reflexes: Reflexes normal.  Psychiatric:        Behavior: Behavior normal.        Thought Content: Thought content normal.        Judgment: Judgment normal.     A total time of 45 minutes was spent preparing to see the patient, reviewing tests, x-rays, operative reports and other medical records.  Also, obtaining history and performing comprehensive physical exam.  Additionally, counseling the patient regarding the above listed issues.   Finally, documenting clinical information in the health records, coordination of care, educating the patient -falls, fatigue, history of cancer. It is a complex case.   Lab Results  Component Value Date   WBC 7.6 03/19/2022   HGB 12.4 03/19/2022   HCT 36.7 03/19/2022   PLT 348 03/19/2022   GLUCOSE 88 05/21/2022   CHOL 263 (H) 10/18/2018   TRIG 271.0 (H) 10/18/2018   HDL 45.40 10/18/2018   LDLDIRECT 160.0 10/18/2018   LDLCALC UNABLE TO CALCULATE IF TRIGLYCERIDE OVER 400 mg/dL 16/02/9603   ALT 21 54/01/8118   AST 28 05/21/2022   NA 134 (L) 05/21/2022   K 4.3 05/21/2022   CL 99 05/21/2022   CREATININE 1.30 (H) 07/23/2022   BUN 21 05/21/2022   CO2 26 05/21/2022   TSH 2.68 05/21/2022   INR 1.0 07/15/2020   HGBA1C 5.8 03/01/2022   MICROALBUR 0.8 12/16/2021    CT CHEST ABDOMEN PELVIS W CONTRAST  Result Date: 07/25/2022 CLINICAL DATA:  Uterine/cervical cancer.  Monitor. EXAM: CT CHEST, ABDOMEN, AND PELVIS WITH CONTRAST TECHNIQUE: Multidetector CT imaging of the chest, abdomen and pelvis was performed following the standard protocol during bolus administration of intravenous contrast. RADIATION DOSE REDUCTION: This exam was performed according to the  departmental dose-optimization program which includes automated exposure control, adjustment of the mA and/or kV according to patient size and/or use of iterative reconstruction technique. CONTRAST:  OMNIPAQUE IOHEXOL 300 MG/ML  SOLN COMPARISON:  CT of the chest abdomen pelvis dated 08/04/2021. FINDINGS: CT CHEST FINDINGS Cardiovascular: There is no cardiomegaly or pericardial effusion. Mild atherosclerotic calcification of the thoracic aorta. No aneurysmal dilatation or dissection. The origins of the great vessels of the aortic arch and the central pulmonary arteries appear patent. Mediastinum/Nodes: No hilar or mediastinal adenopathy. The esophagus is grossly unremarkable. No mediastinal fluid collection. Lungs/Pleura: Postsurgical changes of the right upper lobe with linear scarring. Additional subpleural scarring noted in the right lower lobe. No consolidative changes. There is no pleural effusion pneumothorax. The central airways are patent. Musculoskeletal: No acute osseous pathology.  Lower cervical ACDF. CT ABDOMEN PELVIS FINDINGS No intra-abdominal free air or free fluid. Hepatobiliary: The liver is unremarkable. Mild biliary dilatation, post cholecystectomy. No retained calcified stone noted in the central CBD. Pancreas: Unremarkable. No pancreatic ductal dilatation or surrounding inflammatory changes. Spleen: Normal in size without focal abnormality. Adrenals/Urinary Tract: The adrenal glands unremarkable. There is a 4 mm nonobstructing left renal inferior pole calculus. Mild bilateral renal parenchyma atrophy. There is no hydronephrosis on either side. There is symmetric enhancement and excretion of contrast by both kidneys. The visualized ureters appear unremarkable. The urinary bladder is mildly distended. Small pocket of air in the anterior bladder likely related to recent instrumentation. Clinical correlation is recommended. Stomach/Bowel: There is moderate stool throughout the colon. There is  no bowel obstruction or active inflammation. The appendix is normal. Vascular/Lymphatic: The abdominal aorta and IVC  are unremarkable. No portal venous gas. There is no adenopathy. Reproductive: Hysterectomy.  No adnexal masses. Other: Postsurgical changes of the pelvic wall. Musculoskeletal: Degenerative changes of the spine. No acute osseous pathology. IMPRESSION: 1. No acute intrathoracic, abdominal, or pelvic pathology. No evidence of metastatic disease in the chest, abdomen, or pelvis. 2. A 4 mm nonobstructing left renal inferior pole calculus. No hydronephrosis. 3. Moderate colonic stool burden. No bowel obstruction. Normal appendix. 4.  Aortic Atherosclerosis (ICD10-I70.0). Electronically Signed   By: Elgie Collard M.D.   On: 07/25/2022 01:29    Assessment & Plan:   Problem List Items Addressed This Visit       Endocrine   Parathyroid adenoma    Endo appt - pending in 12/2022 Check labs      Relevant Orders   Cortisol   Comprehensive metabolic panel   TSH   Vitamin B12   VITAMIN D 25 Hydroxy (Vit-D Deficiency, Fractures)     Other   Vitamin D deficiency   Relevant Orders   VITAMIN D 25 Hydroxy (Vit-D Deficiency, Fractures)   Falls frequently    Likely Rx related Hold Hydralazine  Recent head CT 03/2022 WNL      Relevant Orders   Cortisol   Comprehensive metabolic panel   TSH   Vitamin B12   VITAMIN D 25 Hydroxy (Vit-D Deficiency, Fractures)   Ataxia - Primary    Likely Rx related Hold Hydralazine  Recent head CT 03/2022 WNL       Relevant Orders   Cortisol   Comprehensive metabolic panel   TSH   Vitamin B12   VITAMIN D 25 Hydroxy (Vit-D Deficiency, Fractures)   Anxiety    Xanax as needed - increase dose due to tolerance  Potential benefits of a long term benzodiazepines  use as well as potential risks  and complications were explained to the patient and were aknowledged. Prozac and Remeron per Dr. Donell Beers      Relevant Orders   Cortisol    Comprehensive metabolic panel   TSH   Vitamin B12   VITAMIN D 25 Hydroxy (Vit-D Deficiency, Fractures)   Other Visit Diagnoses     Paresthesias       Relevant Orders   Vitamin B12         No orders of the defined types were placed in this encounter.     Follow-up: Return in about 3 months (around 11/15/2022) for a follow-up visit.  Sonda Primes, MD

## 2022-08-16 NOTE — Assessment & Plan Note (Signed)
Xanax as needed - increase dose due to tolerance  Potential benefits of a long term benzodiazepines  use as well as potential risks  and complications were explained to the patient and were aknowledged. Prozac and Remeron per Dr. Plovsky 

## 2022-08-16 NOTE — Assessment & Plan Note (Signed)
Likely Rx related Hold Hydralazine  Recent head CT 03/2022 WNL

## 2022-08-17 LAB — URINALYSIS
Bilirubin Urine: NEGATIVE
Hgb urine dipstick: NEGATIVE
Ketones, ur: NEGATIVE
Leukocytes,Ua: NEGATIVE
Nitrite: NEGATIVE
Specific Gravity, Urine: 1.01 (ref 1.000–1.030)
Total Protein, Urine: NEGATIVE
Urine Glucose: NEGATIVE
Urobilinogen, UA: 0.2 (ref 0.0–1.0)
pH: 6 (ref 5.0–8.0)

## 2022-08-17 LAB — VITAMIN D 25 HYDROXY (VIT D DEFICIENCY, FRACTURES): VITD: 53.5 ng/mL (ref 30.00–100.00)

## 2022-08-17 LAB — TSH: TSH: 2.44 u[IU]/mL (ref 0.35–5.50)

## 2022-08-17 LAB — CORTISOL: Cortisol, Plasma: 9.5 ug/dL

## 2022-08-17 LAB — VITAMIN B12: Vitamin B-12: 559 pg/mL (ref 211–911)

## 2022-08-18 ENCOUNTER — Telehealth: Payer: Self-pay | Admitting: Cardiology

## 2022-08-18 ENCOUNTER — Encounter: Payer: Self-pay | Admitting: Internal Medicine

## 2022-08-18 ENCOUNTER — Ambulatory Visit (INDEPENDENT_AMBULATORY_CARE_PROVIDER_SITE_OTHER): Payer: 59 | Admitting: Psychology

## 2022-08-18 DIAGNOSIS — F4323 Adjustment disorder with mixed anxiety and depressed mood: Secondary | ICD-10-CM

## 2022-08-18 NOTE — Progress Notes (Signed)
[name] Time: 4:10p-5:00 p50 minutes   [name] is a 72 y.o. 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 (Status: maintained) -- No Description Entered  Diminished interest in or enjoyment of activities. (Status: maintained) -- No Description Entered  Lack of energy. (Status: maintained) -- No Description Entered  Sad affect, social withdrawal, anxiety, loss of interest in activities, and low energy. (Status: maintained) -- No Description Entered  Medication Status compliance  Safety unspecified If Suicidal or Homicidal State Action Taken: unspecified  Current Risk:  Medications Buspar (Dosage: 30mg  2X/day)  New Medication (Dosage: 250mg  2x/day)  New Medication (Dosage: 250mg  2x/day)  Prozac (Dosage: 40mg  daily)  Remeron (Dosage: 30mg  nightly)  xanax (Dosage: .5mg  2-3X/day)  Objectives Related Problem: Recognize, accept, and cope with feelings of depression. Description: Identify and replace thoughts and beliefs that support depression. Target Date: 2022-10-28 Frequency: Daily Modality: individual Progress: 75%  Related Problem: Recognize, accept, and cope with feelings of depression. Description: Verbalize an understanding of healthy and unhealthy emotions with the intent of increasing the use of healthy emotions to guide actions. Target Date: 2022-10-28 Frequency: Daily Modality: individual Progress: 75%  Related Problem: Recognize, accept, and cope with feelings of depression. Description: Increasingly verbalize hopeful and positive statements regarding self, others, and the future. Target Date: 2022-04-28 Frequency: Daily Modality: individual Progress: 100%  Related Problem: Reduce fear, anxiety, and worry associated with the medical  condition. Description: Share with significant others efforts to adapt successfully to the medical condition/diagnosis. Target Date: 2022-10-28 Frequency: Daily Modality: individual Progress: 85%  Related Problem: Reduce fear, anxiety, and worry associated with the medical condition. Description: Engage in social, productive, and recreational activities that are possible in spite of medical condition. Target Date: 2022-10-28 Frequency: Daily Modality: individual Progress: 90%  Related Problem: Reduce fear, anxiety, and worry associated with the medical condition. Description: Identify the coping skills and sources of emotional support that have been beneficial in the past. Target Date: 2022-10-28 Frequency: Daily Modality: individual Progress: 75%  Client Response full compliance  Service Location Location, [name] [place], [place] [address]  Service Code cpt [phone]  Related past to present  Facilitate problem solving  Identify/label emotions  Validate/empathize  Self care activities  Lifestyle change (exercise, nutrition)  Assess/facilitate readiness to change  Relaxation training  Rationally challenge thoughts or beliefs/cognitive restructuring  Comments  Dx: F43.21  Meds: Prozac, [name], patient is struggling to rebuild her life and adjust to her new circumstances. She suffers a negative self-concept and has little confidence in her judgement. She is seeking to reduce symptoms of anxiety and self-doubt and to re-define satisfa ction moving forward. Therapy will focus on cognitive restructuring to redefine her experiences and focus on those satisfying aspects of her life. Will target this goal completion for April1, 2021 (completed). She is also wanting to utilize counseling to adjust and accept her latest diagnosis of cancer. She wants assistance to better manage this news and remain positive throughout  treatment. Goal date is 12-22. Patient has made progress, but her medical condition is variable and she requests additional treatment to manage moods throughout her medical ordeal.  In addition, she is trying to manage feelings associated with her older son's mental health struggles. Revised goal date is 6-24.  Patient agrees to have a video session. She is at home and I am in home office.   [name] says she is feeling physically better which has also helped her frame of mind. She talked about the ways she is striving to stay healthy. Has been passing time by watching television. She has been talking with friends and is more optimistic than before. Is more successful at having a positive outlook. Family relationships are stable (with exception of older son).                                                                                                                                                                             [name] Time: 4:10p-5:00p 50 minutes

## 2022-08-18 NOTE — Telephone Encounter (Signed)
Pt c/o BP issue: STAT if pt c/o blurred vision, one-sided weakness or slurred speech  1. What are your last 5 BP readings? 152/98 HR 65  2. Are you having any other symptoms (ex. Dizziness, headache, blurred vision, passed out)? Headache, Dizziness and Nausea, SOB and has had some blurred vision and trouble with balance  3. What is your BP issue? "This is happening multiple times a day so I'm concerned"

## 2022-08-18 NOTE — Telephone Encounter (Signed)
Saw oncology and had elevated BP 152/98.  Saw PCP this Monday and BP was normal 125/80. At that time having headache nausea, and balance was off. PCP told her to stop hydralazine stating that was causing dizziness, balance issues, and she fell twice with no injury. States tachy with SOB and dizzy . She is currently taking Bystolic 10mg  daily and Lasix 20mg . No hydralazine nor Zofran as nausea resolves with sitting  She state asked PCP for  labs as this feels the same when had issues with parathyroid.      08/18/22 BP 104/64  after medication.  11am  148/85 HR 65 light activity  08/17/22 BP 126/85 HR 60   11am 134/92  HR 67  12pm  BP 116/72  HR 62 08/16/22 BP  135/89  HR 62 11am 147/92  HR 68  7pm  131/79 HR 57  Dizzy, H/A, nausea and "feel like heart is racing when the BP is elevated at about the 11 time each day.  She states each time doing slight activity like washing dishes.  She sits, drinks cold water, and resumes to normal.  She just feels she can't go anywhere since she doesn't know when this will happen.  She knows it isn't dangerously high but concerned. She has appt. May 1 but she feels needs to be sooner.

## 2022-08-20 ENCOUNTER — Telehealth: Payer: Self-pay | Admitting: Internal Medicine

## 2022-08-20 NOTE — Telephone Encounter (Signed)
Pt calling back for an update  

## 2022-08-20 NOTE — Telephone Encounter (Signed)
No need to order PTH.  Calcium was normal.  Thanks

## 2022-08-20 NOTE — Telephone Encounter (Signed)
Pt wanting Dr. Genella Rife PTH ordered. Pt also wants Dr. to read her message on mychart.

## 2022-08-20 NOTE — Telephone Encounter (Signed)
Patient scheduled for April 11th at 9:20am

## 2022-08-23 ENCOUNTER — Ambulatory Visit: Payer: 59 | Admitting: Psychology

## 2022-08-23 ENCOUNTER — Encounter: Payer: Self-pay | Admitting: Internal Medicine

## 2022-08-23 NOTE — Telephone Encounter (Signed)
Called pt no answer LMOM w/MD response../lmb 

## 2022-08-26 ENCOUNTER — Encounter: Payer: Self-pay | Admitting: Cardiology

## 2022-08-26 ENCOUNTER — Ambulatory Visit: Payer: 59 | Attending: Cardiology | Admitting: Cardiology

## 2022-08-26 ENCOUNTER — Ambulatory Visit: Payer: 59 | Admitting: Gynecologic Oncology

## 2022-08-26 VITALS — BP 170/98 | HR 61 | Ht 62.0 in | Wt 155.0 lb

## 2022-08-26 DIAGNOSIS — R2681 Unsteadiness on feet: Secondary | ICD-10-CM | POA: Diagnosis not present

## 2022-08-26 DIAGNOSIS — I10 Essential (primary) hypertension: Secondary | ICD-10-CM

## 2022-08-26 DIAGNOSIS — I491 Atrial premature depolarization: Secondary | ICD-10-CM | POA: Diagnosis not present

## 2022-08-26 MED ORDER — HYDROCHLOROTHIAZIDE 12.5 MG PO CAPS: 12.5000 mg | ORAL_CAPSULE | Freq: Every day | ORAL | 3 refills | Status: DC

## 2022-08-26 NOTE — Patient Instructions (Signed)
Medication Instructions:  Your physician has recommended you make the following change in your medication:  START: Hydrochlorothiazide 12.5 mg once daily HOLD: Lasix  Send my MyChart message with update *If you need a refill on your cardiac medications before your next appointment, please call your pharmacy*   Lab Work: None   Testing/Procedures: None   Follow-Up: At [facility], you and your health needs are our priority.  As part of our continuing mission to provide you with exceptional heart care, we have created designated Provider Care Teams.  These Care Teams include your primary Cardiologist (physician) and Advanced Practice Providers (APPs -  Physician Assistants and Nurse Practitioners) who all work together to provide you with the care you need, when you need it.   Your next appointment:   4 week(s)  Provider:   Thomasene Ripple, DO     Other Instructions Vegan Diet A vegan diet excludes all foods that come from animals, including foods that are made from meat, fish, poultry, dairy, and eggs. A vegan diet may be followed for ethical reasons or health reasons, or both. What are tips for following this plan? While following this diet, it is important to find plant sources or supplements that contain the same nutrients you would find in the foods that come from animals. Talk with your health care provider or dietitian about whether you should be taking nutritional supplements. What do I need to know about the vegan diet? This diet includes all foods that come from plants. This diet excludes all foods that come from animal sources. A vegan diet can lack certain nutrients that are commonly found in animal products. These nutrients include: Protein. Vitamin B12. Vitamin D. Iron. Omega-3 fatty acids. Calcium. Zinc. Iodine. What foods can I eat?  Fruits Any. Vegetables Any. Grains Any. Include whole grains as they provide a good source of protein. Meats and  other proteins Beans, such as black beans or kidney beans. Other legumes, such as lentils and split peas. Soy products. Nuts, such as almonds, [place] nuts, and pecans. Seeds, such as sunflower seeds. Tofu. Tempeh. Hummus. Dairy Any dairy product that is made with milk that comes from soy, almonds, rice, hemp, coconut, or any other type of nut. Fats and oils All vegetable-based oils, such as olive, canola, coconut, corn, safflower, peanut, and sesame oils. All vegan butters. Beverages Juice. Carbonated soft drinks. Tea. Sweets and desserts Any dessert that is labeled "vegan." Ice cream that is made with milk from soy, almond, rice, hemp, coconut, or other nuts, and does not have other animal ingredients (such as chocolate chips that are made from cow milk). Vitamin B12 Breakfast cereals and prepared products that have vitamin B12 added to them (are fortified). Nutritional yeast. Vitamin D Orange juice fortified with vitamin D. Fortified mushrooms. Fortified cereals. Iron Dark leafy greens. Nuts. Nut butters. Pumpkin seeds. Beans. Grain products that have added iron, such as cereals. Tofu. Tempeh. Soybeans. Quinoa. Molasses. Plant-based iron is absorbed better when it is eaten with a food that has vitamin C. Omega-3 fatty acids Walnuts. Foods with added omega-3 fatty acids, such as juices. Flax seeds. Hemp seeds. Canola oil and soybean oil. Tofu. Calcium Dark leafy greens, such as kale, bok choy, Chinese cabbage, collard greens, and mustard greens. Broccoli. Okra. Soy products with added calcium. Calcium-fortified breakfast cereals. Calcium-fortified fruit juices. Figs. Zinc Wheat germ, cereals, and breads that have added zinc. Baked beans. Legumes, such as cashews, chickpeas, kidney beans, and green peas. Almonds and nut  butters. Tofu and other soy products. Iodine Iodized table salt. Seaweed. Seasonings and condiments Nutritional yeast. Salt. Pepper. Fresh herbs. Soy sauce. Vegetable  broth. The items listed above may not be a complete list of foods and beverages you can eat. Contact a dietitian for more options. What foods should I avoid? Fruits No fruits need to be avoided. All fruits are included in this diet. Vegetables No vegetables need to be avoided. All vegetables are included in this diet. Grains No grains need to be avoided. All grains are included in this diet. Meats and other proteins Any animal meats. Poultry. Eggs. Fish. Seafood. Dairy Milk, cheese, yogurt, or ice cream that is made from cow milk, goat milk, or sheep milk. Fats and oils Butter. Lard. Beverages Cow milk. Goat milk. Sheep milk. Drinks that are sweetened with honey. Sweets and desserts Any desserts that are made with eggs or animal milk. Honey. Cheesecake. Seasonings and condiments Honey. Any condiments made with eggs or animal milk. The items listed above may not be a complete list of foods and beverages to avoid. Contact a dietitian for more information. Summary A vegan diet excludes any foods or drinks that come from an animal. A vegan diet may be followed for ethical reasons or health reasons, or both. Following this diet can put you at risk of becoming deficient in specific vitamins and minerals. These include vitamin B12, iron, calcium, iodine, and zinc. Talk with your health care provider or dietitian to make sure you get these essential nutrients in your diet. This information is not intended to replace advice given to you by your health care provider. Make sure you discuss any questions you have with your health care provider. Document Revised: 02/26/2021 Document Reviewed: 02/26/2021 Elsevier Patient Education  2023 [employer]    Vegetarian Eating Information Many people may prefer vegetarian eating for religious, environmental, and other personal reasons. These diets are often lower in salt, sugar, cholesterol, and saturated and trans fats. Vegetarian eating provides  significant health benefits. People who eat a vegetarian diet often have lower rates of: Obesity. Diabetes. Breast and colon cancers. Cardiovascular and gallbladder diseases. What are the types of vegetarian eating?  Vegetarian eating includes dietary choices that focus on eating mostly vegetables and fruit, grains, beans, nuts, and seeds. There are several different types of vegetarian eating. Talk with a dietitian about what type of vegetarian diet is best for you. Lacto-ovo vegetarian Recommended foods: fruits and vegetables, milk and dairy, eggs, grains, soy and vegetable protein, beans, nuts, and seeds. Foods to avoid: meat, poultry, seafood, animal-based broths and gravies, and gelatin. Lacto-vegetarian Recommended foods: fruits and vegetables, milk and dairy, grains, soy and vegetable protein, beans, nuts, and seeds. Foods to avoid: meat, poultry, seafood, animal-based broths and gravies, gelatin, and eggs. Vegan Recommended foods: fruits and vegetables, grains, soy and vegetable protein, beans, nuts, and seeds. Foods to avoid: meat, poultry, seafood, animal-based broths and gravies, gelatin, eggs, milk and dairy, and honey. What do I need to know about vegetarian eating? All vegetarian diets restrict proteins that come from animals. Foods that come from animals have important nutrients, such as protein, fats, vitamins, and minerals. It is important to get these nutrients from other types of foods. When shopping, focus on choosing whole foods to prepare at home instead of buying packaged meals that can be high in salt (sodium), sugar, and fat. If you think you may not be getting the right nutrients, or if you do not eat any animal products,  talk with your health care provider or dietitian about taking supplements. A dietitian can help determine your individual nutrient needs. What are tips for following this plan? Eat a diet that includes a variety of fruits, vegetables, whole grains, and  protein sources. This is important to make sure you get enough of the following nutrients: Protein Healthy protein sources include: Eggs, milk, and cheese. Soy products. Tofu, tempeh, and textured vegetable protein (TVP). Quinoa. Hemp seeds. Other protein sources include: Beans, such as black beans or kidney beans. Other legumes, such as lentils and split peas. Nuts, such as almonds, [place] nuts, and pecans. Seeds, such as sunflower seeds. To get the most benefit from plant-based proteins, combine two or more sources of plant protein with whole grains in one dish. Examples include beans and rice, almond butter on bread, or sunflower seeds on noodles. Vitamin B12 Sources of vitamin B12 include: Cheese and eggs. Breakfast cereals and other prepared products that have vitamin B12 added (fortified products). If you eat a vegan diet, ask your health care provider or dietitian about taking a B12 supplement. Vitamin D Good sources of vitamin D include: Egg yolks. Fortified dairy products. Fortified orange juice. Mushrooms. Cereals with added vitamin D. Another way of getting vitamin D is to spend 10 minutes each day in the sun. This helps your body make its own vitamin D. Depending on your age, you may need to take a vitamin D supplement. Talk with your health care provider or dietitian about how much vitamin D you need. Iron Healthy sources of iron include: Dark, leafy greens. Nuts. Beans. Grain products that are fortified with iron, such as cereals. Tofu, tempeh, soybeans, and quinoa. To get the most iron from plant-based foods: Eat iron-containing plant-based foods with vitamin C. For example, squeeze fresh lemon juice over cooked greens like kale, chard, or spinach, or have a glass of orange juice with your meals. Avoid eating dairy products, coffee, or tea with iron-containing foods. Omega-3 fatty acids Good sources of omega-3 fatty acids include: Walnuts. Flaxseeds, canola oil, soybean oil,  and tofu. Avocados. Olives and olive oil. Foods with added omega-3 fatty acids, such as eggs, milk, and juices. Calcium Good sources of calcium include: Dairy products. Fortified nondairy milk. Fortified tofu. Dark, leafy greens, such as kale, bok choy, Chinese cabbage, collard greens, and mustard greens. Broccoli. Okra. Fortified breakfast cereals and fruit juices. Figs. Zinc Good sources of zinc include: Pumpkin seeds. Legumes, such as chickpeas, kidney beans, and green peas. Wheat germ, whole grains, and fortified cereals. Mushrooms. Spinach and kale. Milk and dairy foods. Dark chocolate. Summary Many people may prefer vegetarian eating for religious, environmental, and other personal reasons. These diets can provide significant health benefits. There are several types of vegetarian diets, but all restrict proteins that come from animals. It is important to make sure that you are getting enough nutrients, including protein, vitamin B12, vitamin D, iron, omega-3 fatty acids, calcium, and zinc, from your diet. If you think you are not getting the right nutrients or if you do not eat any animal products, talk with your health care provider or dietitian. This information is not intended to replace advice given to you by your health care provider. Make sure you discuss any questions you have with your health care provider. Document Revised: 04/23/2020 Document Reviewed: 04/23/2020 Elsevier Patient Education  2023 [employer].

## 2022-08-27 NOTE — Progress Notes (Unsigned)
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 ingredients (such as chocolate chips that are made from cow milk). Vitamin B12 Breakfast cereals and prepared products that have vitamin B12 added to them (are fortified). Nutritional yeast. Vitamin D Orange juice fortified with vitamin D. Fortified mushrooms. Fortified cereals. Iron Dark leafy greens. Nuts. Nut butters. Pumpkin seeds. Beans. Grain products that have added iron, such as cereals. Tofu. Tempeh. Soybeans. Quinoa. Molasses. Plant-based iron is absorbed better when it is eaten with a food that has vitamin C. Omega-3 fatty acids Walnuts. Foods with added omega-3 fatty acids, such as juices. Flax seeds. Hemp seeds. Canola oil and soybean oil. Tofu. Calcium Dark leafy greens, such as kale, bok choy, Chinese cabbage, collard greens, and mustard greens. Broccoli. Okra. Soy products with added calcium. Calcium-fortified breakfast cereals. Calcium-fortified fruit juices. Figs. Zinc Wheat germ, cereals, and breads that have added zinc. Baked beans.  Legumes, such as cashews, chickpeas, kidney beans, and green peas. Almonds and nut butters. Tofu and other soy products. Iodine Iodized table salt. Seaweed. Seasonings and condiments Nutritional yeast. Salt. Pepper. Fresh herbs. Soy sauce. Vegetable broth. The items listed above may not be a complete list of foods and beverages you can eat. Contact a dietitian for more options. What foods should I avoid? Fruits No fruits need to be avoided. All fruits are included in this diet. Vegetables No vegetables need to be avoided. All vegetables are included in this diet. Grains No grains need to be avoided. All grains are included in this diet. Meats and other proteins Any animal meats. Poultry. Eggs. Fish. Seafood. Dairy Milk, cheese, yogurt, or ice cream that is made from cow milk, goat milk, or sheep milk. Fats and oils Butter. Lard. Beverages Cow milk. Goat milk. Sheep milk. Drinks that are sweetened with honey. Sweets and desserts Any desserts that are made with eggs or animal milk. Honey. Cheesecake. Seasonings and condiments Honey. Any condiments made with eggs or animal milk. The items listed above may not be a complete list of foods and beverages to avoid. Contact a dietitian for more information. Summary A vegan diet excludes any foods or drinks that come from an animal. A vegan diet may be followed for ethical reasons or health reasons, or both. Following this diet can put you at risk of becoming deficient in specific vitamins and minerals. These include vitamin B12, iron, calcium, iodine, and zinc. Talk with your health care provider or dietitian to make sure you get these essential nutrients in your diet. This information is not intended to replace advice given to you by your health care provider. Make sure you discuss any questions you have with your health care provider. Document Revised: 02/26/2021 Document Reviewed: 02/26/2021 Elsevier Patient Education  2023 Elsevier Inc.     Vegetarian Eating Information Many people may prefer vegetarian eating for religious, environmental, and other  personal reasons. These diets are often lower in salt, sugar, cholesterol, and saturated and trans fats. Vegetarian eating provides significant health benefits. People who eat a vegetarian diet often have lower rates of: Obesity. Diabetes. Breast and colon cancers. Cardiovascular and gallbladder diseases. What are the types of vegetarian eating?  Vegetarian eating includes dietary choices that focus on eating mostly vegetables and fruit, grains, beans, nuts, and seeds. There are several different types of vegetarian eating. Talk with a dietitian about what type of vegetarian diet is best for you. Lacto-ovo vegetarian Recommended foods: fruits and vegetables, milk and dairy, eggs, grains, soy and vegetable protein, beans, nuts, and seeds. Foods to avoid: meat, poultry, seafood, animal-based broths and gravies, and gelatin. Lacto-vegetarian Recommended foods: fruits and vegetables, milk and dairy, grains, soy and vegetable protein, beans, nuts, and seeds. Foods to avoid: meat, poultry, seafood, animal-based broths and gravies, gelatin, and eggs. Vegan Recommended foods: fruits and vegetables, grains, soy and vegetable protein, beans, nuts, and seeds. Foods to avoid: meat, poultry, seafood, animal-based broths and gravies, gelatin, eggs, milk and dairy, and honey. What do I need to know about vegetarian eating? All vegetarian diets restrict proteins that come from animals. Foods that come from animals have important nutrients, such as protein, fats, vitamins, and minerals. It is important to get these nutrients from other types of foods. When shopping, focus on choosing whole foods to prepare at home instead of buying packaged meals that can be high in salt (sodium), sugar, and fat. If you think you may not be getting the right nutrients, or if you do not eat any animal products, talk  with your health care provider or dietitian about taking supplements. A dietitian can help determine your individual nutrient needs. What are tips for following this plan? Eat a diet that includes a variety of fruits, vegetables, whole grains, and protein sources. This is important to make sure you get enough of the following nutrients: Protein Healthy protein sources include: Eggs, milk, and cheese. Soy products. Tofu, tempeh, and textured vegetable protein (TVP). Quinoa. Hemp seeds. Other protein sources include: Beans, such as black beans or kidney beans. Other legumes, such as lentils and split peas. Nuts, such as almonds, [place] nuts, and pecans. Seeds, such as sunflower seeds. To get the most benefit from plant-based proteins, combine two or more sources of plant protein with whole grains in one dish. Examples include beans and rice, almond butter on bread, or sunflower seeds on noodles. Vitamin B12 Sources of vitamin B12 include: Cheese and eggs. Breakfast cereals and other prepared products that have vitamin B12 added (fortified products). If you eat a vegan diet, ask your health care provider or dietitian about taking a B12 supplement. Vitamin D Good sources of vitamin D include: Egg yolks. Fortified dairy products. Fortified orange juice. Mushrooms. Cereals with added vitamin D. Another way of getting vitamin D is to spend 10 minutes each day in the sun. This helps your body make its own vitamin D. Depending on your age, you may need to take a vitamin D supplement. Talk with your health care provider or dietitian about how much vitamin D you need. Iron Healthy sources of iron include: Dark, leafy greens. Nuts. Beans. Grain products that are fortified with iron, such as cereals. Tofu, tempeh, soybeans, and quinoa. To get the most iron from plant-based foods: Eat iron-containing plant-based foods with vitamin C. For example, squeeze fresh lemon juice over cooked greens like kale, chard,  or spinach, or have a glass of  orange juice with your meals. Avoid eating dairy products, coffee, or tea with iron-containing foods. Omega-3 fatty acids Good sources of omega-3 fatty acids include: Walnuts. Flaxseeds, canola oil, soybean oil, and tofu. Avocados. Olives and olive oil. Foods with added omega-3 fatty acids, such as eggs, milk, and juices. Calcium Good sources of calcium include: Dairy products. Fortified nondairy milk. Fortified tofu. Dark, leafy greens, such as kale, bok choy, Chinese cabbage, collard greens, and mustard greens. Broccoli. Okra. Fortified breakfast cereals and fruit juices. Figs. Zinc Good sources of zinc include: Pumpkin seeds. Legumes, such as chickpeas, kidney beans, and green peas. Wheat germ, whole grains, and fortified cereals. Mushrooms. Spinach and kale. Milk and dairy foods. Dark chocolate. Summary Many people may prefer vegetarian eating for religious, environmental, and other personal reasons. These diets can provide significant health benefits. There are several types of vegetarian diets, but all restrict proteins that come from animals. It is important to make sure that you are getting enough nutrients, including protein, vitamin B12, vitamin D, iron, omega-3 fatty acids, calcium, and zinc, from your diet. If you think you are not getting the right nutrients or if you do not eat any animal products, talk with your health care provider or dietitian. This information is not intended to replace advice given to you by your health care provider. Make sure you discuss any questions you have with your health care provider. Document Revised: 04/23/2020 Document Reviewed: 04/23/2020 Elsevier Patient Education  2023 Elsevier Inc.    Adopting a Healthy Lifestyle.  Know what a healthy weight is for you (roughly BMI <25) and aim to maintain this   Aim for 7+ servings of fruits and vegetables daily   65-80+ fluid ounces of water or unsweet tea for healthy  kidneys   Limit to max 1 drink of alcohol per day; avoid smoking/tobacco   Limit animal fats in diet for cholesterol and heart health - choose grass fed whenever available   Avoid highly processed foods, and foods high in saturated/trans fats   Aim for low stress - take time to unwind and care for your mental health   Aim for 150 min of moderate intensity exercise weekly for heart health, and weights twice weekly for bone health   Aim for 7-9 hours of sleep daily   When it comes to diets, agreement about the perfect plan isnt easy to find, even among the experts. Experts at the [facility] [employer] developed an idea known as the Healthy Eating Plate. Just imagine a plate divided into logical, healthy portions.   The emphasis is on diet quality:   Load up on vegetables and fruits - one-half of your plate: Aim for color and variety, and remember that potatoes dont count.   Go for whole grains - one-quarter of your plate: Whole wheat, barley, wheat berries, quinoa, oats, brown rice, and foods made with them. If you want pasta, go with whole wheat pasta.   Protein power - one-quarter of your plate: Fish, chicken, beans, and nuts are all healthy, versatile protein sources. Limit red meat.   The diet, however, does go beyond the plate, offering a few other suggestions.   Use healthy plant oils, such as olive, canola, soy, corn, sunflower and peanut. Check the labels, and avoid partially hydrogenated oil, which have unhealthy trans fats.   If youre thirsty, drink water. Coffee and tea are good in moderation, but skip sugary drinks and limit milk and dairy products to one or two daily  servings.   The type of carbohydrate in the diet is more important than the amount. Some sources of carbohydrates, such as vegetables, fruits, whole grains, and beans-are healthier than others.   Finally, stay active  Signed, Thomasene Ripple, DO  08/28/2022 4:55 PM    [facility]

## 2022-09-01 ENCOUNTER — Ambulatory Visit (INDEPENDENT_AMBULATORY_CARE_PROVIDER_SITE_OTHER): Payer: 59 | Admitting: Psychology

## 2022-09-01 ENCOUNTER — Encounter: Payer: Self-pay | Admitting: Cardiology

## 2022-09-01 DIAGNOSIS — F4323 Adjustment disorder with mixed anxiety and depressed mood: Secondary | ICD-10-CM | POA: Diagnosis not present

## 2022-09-01 NOTE — Progress Notes (Signed)
[name] Time: 4:10p-5:00 p50 minutes   [name] is a 72 y.o. female patient  Date: 09/01/2022  Treatment Plan Diagnosis F43.21 (Adjustment Disorder, With depressed mood) [n/a]  Symptoms A diagnosis of an acute, serious illness that is life-threatening. (Status: maintained) -- No Description Entered  Diminished interest in or enjoyment of activities. (Status: maintained) -- No Description Entered  Lack of energy. (Status: maintained) -- No Description Entered  Sad affect, social withdrawal, anxiety, loss of interest in activities, and low energy. (Status: maintained) -- No Description Entered  Medication Status compliance  Safety unspecified If Suicidal or Homicidal State Action Taken: unspecified  Current Risk:  Medications Buspar (Dosage:  2X/day)  New Medication (Dosage:  2x/day)  New Medication (Dosage:  2x/day)  Prozac (Dosage:  daily)  Remeron (Dosage:  nightly)  xanax (Dosage: .  2-3X/day)  Objectives Related Problem: Recognize, accept, and cope with feelings of depression. Description: Identify and replace thoughts and beliefs that support depression. Target Date: 2022-10-28 Frequency: Daily Modality: individual Progress: 75%  Related Problem: Recognize, accept, and cope with feelings of depression. Description: Verbalize an understanding of healthy and unhealthy emotions with the intent of increasing the use of healthy emotions to guide actions. Target Date: 2022-10-28 Frequency: Daily Modality: individual Progress: 75%  Related Problem: Recognize, accept, and cope with feelings of depression. Description: Increasingly verbalize hopeful and positive statements regarding self, others, and the future. Target Date: 2022-04-28 Frequency: Daily Modality: individual Progress: 100%  Related Problem: Reduce fear, anxiety, and worry associated with the medical condition. Description: Share with significant others  efforts to adapt successfully to the medical condition/diagnosis. Target Date: 2022-10-28 Frequency: Daily Modality: individual Progress: 85%  Related Problem: Reduce fear, anxiety, and worry associated with the medical condition. Description: Engage in social, productive, and recreational activities that are possible in spite of medical condition. Target Date: 2022-10-28 Frequency: Daily Modality: individual Progress: 90%  Related Problem: Reduce fear, anxiety, and worry associated with the medical condition. Description: Identify the coping skills and sources of emotional support that have been beneficial in the past. Target Date: 2022-10-28 Frequency: Daily Modality: individual Progress: 75%  Client Response full compliance  Service Location Location, [address] [place], [place] [address]  Service Code cpt [phone]  Related past to present  Facilitate problem solving  Identify/label emotions  Validate/empathize  Self care activities  Lifestyle change (exercise, nutrition)  Assess/facilitate readiness to change  Relaxation training  Rationally challenge thoughts or beliefs/cognitive restructuring  Comments  Dx: F43.21  Meds: Prozac, [name], patient is struggling to rebuild her life and adjust to her new circumstances. She suffers a negative self-concept and has little confidence in her judgement. She is seeking to reduce symptoms of anxiety and self-doubt and to re-define satisfa ction moving forward. Therapy will focus on cognitive restructuring to redefine her experiences and focus on those satisfying aspects of her life. Will target this goal completion for April1, 2021 (completed). She is also wanting to utilize counseling to adjust and accept her latest diagnosis of cancer. She wants assistance to better manage this news and remain positive throughout treatment. Goal date is 12-22. Patient has made progress,  but her medical condition is variable and she requests additional treatment to manage moods throughout her medical ordeal. In addition, she is trying to manage feelings associated with her older son's mental health struggles. Revised goal date is 6-24.  Patient agrees to have a video  session. She is at home and I am in home office.   [name] says she has been having a lot of anxiety and depression. She is not sure of the trigger and says she cannot connect these feelings to anything. She gets agitated and upset, and has been thinking that she will not get to where she wants to be physically. She is very tired with very little energy, which also contributes to feeling depressed. It is hard to do just basic daily activities. We talked about the need to focus on self care. Also discussed her medications and whether she should consider any changes. She sees her psychiatrist Monday and will discuss. I will see her the same day and we will address any recommended changes.                                                                                                                                                                                [name] Time: 3:15p-4:00p 45 minutes

## 2022-09-02 ENCOUNTER — Telehealth: Payer: Self-pay | Admitting: Cardiology

## 2022-09-02 ENCOUNTER — Other Ambulatory Visit: Payer: Self-pay | Admitting: Cardiology

## 2022-09-02 ENCOUNTER — Telehealth: Payer: Self-pay

## 2022-09-02 DIAGNOSIS — I10 Essential (primary) hypertension: Secondary | ICD-10-CM

## 2022-09-02 MED ORDER — NEBIVOLOL HCL 5 MG PO TABS: 5.0000 mg | ORAL_TABLET | Freq: Two times a day (BID) | ORAL | 3 refills | Status: DC

## 2022-09-02 NOTE — Telephone Encounter (Signed)
Message has already been sent to provider.  She states she is continuing to send messages to my chart.  Advised that the doctor will see message but they also vae clinic during the day so cannot answer right away.  Advised that we will call her with any information

## 2022-09-02 NOTE — Addendum Note (Signed)
Addended by: Trudie Reed D on: 09/02/2022 12:17 PM   Modules accepted: Orders

## 2022-09-02 NOTE — Telephone Encounter (Signed)
Patient is calling in regards to MyChart message sent. Patient disconnected before I was able to transfer to a nurse I though may have reached out to her.

## 2022-09-02 NOTE — Telephone Encounter (Signed)
Patient called and was leaving another My chart.  Advised to wait until Dr Servando Salina could review message  She does ask if Bistolic could be taken twice a day rather than once.  She stats this has been going on for so long and she has to have something done.  "I can't even go out of the house".  Again advised to let Dr Servando Salina review, no further messages are needed . We will let her know recommendations

## 2022-09-02 NOTE — Telephone Encounter (Signed)
Spoke with the patient explained Dr. Servando Salina recommendations: Thank you for the update.  I am fine if you want to take your Bystolic twice daily we will switch  it to 5 mg twice a day.  Did the headache started after starting the hydrochlorothiazide? In terms of the significant fatigue if this has been going on for a long time it would be best for  [place] to really get a sleep study to make sure that sleep apnea is not playing a role here. In terms of the gait instability you still would need to see neurology.  Thank you for the update.    Patient stated she does not think her headaches are related to medications however, she believes its related to elevated blood pressures. She does have appointment with neurology scheduled and   stated she does agree to have an sleep study done. Verified and sent new prescription to pharmacy on file. Patient voiced understanding.

## 2022-09-02 NOTE — Telephone Encounter (Signed)
Message was sent via MyChart:  Dr Servando Salina, Please call me as soon as you can. I am feeling worse and waking up early in the morning with dull headache, joint pain and hypertension of 174/87 p69. I am only able to take my morning medication and feel extremely tired even though I have slept all night. Thank you, [name] [phone]  Patient stated her blood pressure medication was changed and she has feeling well. Pt stated she does have a headache, fatigue, and elevated blood pressure 174/97 this AM .Patient stated this is not an emergency situation, but she is becoming more anxious and depressed. Explained ED precautions. Patient voiced understanding.  Will sent to MD and nurse.

## 2022-09-06 ENCOUNTER — Ambulatory Visit (INDEPENDENT_AMBULATORY_CARE_PROVIDER_SITE_OTHER): Payer: 59 | Admitting: Psychology

## 2022-09-06 DIAGNOSIS — F4321 Adjustment disorder with depressed mood: Secondary | ICD-10-CM

## 2022-09-06 DIAGNOSIS — F4323 Adjustment disorder with mixed anxiety and depressed mood: Secondary | ICD-10-CM

## 2022-09-06 NOTE — Progress Notes (Signed)
[name] Time: 4:10p-5:00 p50 minutes   [name] is a 72 y.o. female patient  Date: 09/06/2022  Treatment Plan Diagnosis F43.21 (Adjustment Disorder, With depressed mood) [n/a]  Symptoms A diagnosis of an acute, serious illness that is life-threatening. (Status: maintained) -- No Description Entered  Diminished interest in or enjoyment of activities. (Status: maintained) -- No Description Entered  Lack of energy. (Status: maintained) -- No Description Entered  Sad affect, social withdrawal, anxiety, loss of interest in activities, and low energy. (Status: maintained) -- No Description Entered  Medication Status compliance  Safety unspecified If Suicidal or Homicidal State Action Taken: unspecified  Current Risk:  Medications Buspar (Dosage:  2X/day)  New Medication (Dosage:  2x/day)  New Medication (Dosage:  2x/day)  Prozac (Dosage:  daily)  Remeron (Dosage:  nightly)  xanax (Dosage: .  2-3X/day)  Objectives Related Problem: Recognize, accept, and cope with feelings of depression. Description: Identify and replace thoughts and beliefs that support depression. Target Date: 2022-10-28 Frequency: Daily Modality: individual Progress: 75%  Related Problem: Recognize, accept, and cope with feelings of depression. Description: Verbalize an understanding of healthy and unhealthy emotions with the intent of increasing the use of healthy emotions to guide actions. Target Date: 2022-10-28 Frequency: Daily Modality: individual Progress: 75%  Related Problem: Recognize, accept, and cope with feelings of depression. Description: Increasingly verbalize hopeful and positive statements regarding self, others, and the future. Target Date: 2022-04-28 Frequency: Daily Modality: individual Progress: 100%  Related Problem: Reduce fear, anxiety, and worry associated with the medical condition. Description: Share  with significant others efforts to adapt successfully to the medical condition/diagnosis. Target Date: 2022-10-28 Frequency: Daily Modality: individual Progress: 85%  Related Problem: Reduce fear, anxiety, and worry associated with the medical condition. Description: Engage in social, productive, and recreational activities that are possible in spite of medical condition. Target Date: 2022-10-28 Frequency: Daily Modality: individual Progress: 90%  Related Problem: Reduce fear, anxiety, and worry associated with the medical condition. Description: Identify the coping skills and sources of emotional support that have been beneficial in the past. Target Date: 2022-10-28 Frequency: Daily Modality: individual Progress: 75%  Client Response full compliance  Service Location Location, [address] [place], [place] [address]  Service Code cpt [phone]  Related past to present  Facilitate problem solving  Identify/label emotions  Validate/empathize  Self care activities  Lifestyle change (exercise, nutrition)  Assess/facilitate readiness to change  Relaxation training  Rationally challenge thoughts or beliefs/cognitive restructuring  Comments  Dx: F43.21  Meds: Prozac, [name], patient is struggling to rebuild her life and adjust to her new circumstances. She suffers a negative self-concept and has little confidence in her judgement. She is seeking to reduce symptoms of anxiety and self-doubt and to re-define satisfa ction moving forward. Therapy will focus on cognitive restructuring to redefine her experiences and focus on those satisfying aspects of her life. Will target this goal completion for April1, 2021 (completed). She is also wanting to utilize counseling to adjust and accept her latest diagnosis of cancer. She wants assistance to better manage this news and remain positive throughout treatment. Goal date is 12-22.  Patient has made progress, but her medical condition is variable and she requests additional treatment to manage moods throughout her medical ordeal. In addition, she is trying to manage feelings associated with her older son's  mental health struggles. Revised goal date is 6-24.  Patient agrees to have a video session. She is at home and I am in home office.   [name] says she feels like she is just hanging on. She feels her nerves are shot and she is not functioning well. She does say she is not suicidal.Saw Dr. Donell Beers today and the only change is that he increased xanax  3X/day and Remeron from  to . He will see her again in 6 weeks instead of 3 months. She is having a hard time getting out of bed in the morning and is fatigued all the time. She is finding it more difficult to stay motivated to do anything.  [name] says that the restraining order against her son runs out in 2 days. She texted Jonny Ruiz about filing a motion to review. He told her that he has just had prostate surgery. He agreed to extend, but isn't able to go to the courthouse right now. She will wait until he is ready and can go. Talked about making her complete focus on self care at this time.                                                                                                                                                                                   Renel Ende Grant Time: 3:15p-4:00p 45 minutes

## 2022-09-09 ENCOUNTER — Ambulatory Visit (INDEPENDENT_AMBULATORY_CARE_PROVIDER_SITE_OTHER): Payer: 59

## 2022-09-09 ENCOUNTER — Encounter: Payer: Self-pay | Admitting: Internal Medicine

## 2022-09-09 ENCOUNTER — Ambulatory Visit (INDEPENDENT_AMBULATORY_CARE_PROVIDER_SITE_OTHER): Payer: 59 | Admitting: Internal Medicine

## 2022-09-09 VITALS — BP 168/92 | HR 60 | Temp 97.6°F | Ht 62.0 in | Wt 160.0 lb

## 2022-09-09 DIAGNOSIS — G8929 Other chronic pain: Secondary | ICD-10-CM

## 2022-09-09 DIAGNOSIS — R059 Cough, unspecified: Secondary | ICD-10-CM | POA: Diagnosis not present

## 2022-09-09 DIAGNOSIS — F314 Bipolar disorder, current episode depressed, severe, without psychotic features: Secondary | ICD-10-CM

## 2022-09-09 DIAGNOSIS — F0781 Postconcussional syndrome: Secondary | ICD-10-CM

## 2022-09-09 DIAGNOSIS — R519 Headache, unspecified: Secondary | ICD-10-CM

## 2022-09-09 DIAGNOSIS — M25551 Pain in right hip: Secondary | ICD-10-CM

## 2022-09-09 DIAGNOSIS — J45991 Cough variant asthma: Secondary | ICD-10-CM

## 2022-09-09 DIAGNOSIS — R296 Repeated falls: Secondary | ICD-10-CM | POA: Diagnosis not present

## 2022-09-09 DIAGNOSIS — F332 Major depressive disorder, recurrent severe without psychotic features: Secondary | ICD-10-CM

## 2022-09-09 DIAGNOSIS — R079 Chest pain, unspecified: Secondary | ICD-10-CM | POA: Diagnosis not present

## 2022-09-09 DIAGNOSIS — M1611 Unilateral primary osteoarthritis, right hip: Secondary | ICD-10-CM | POA: Diagnosis not present

## 2022-09-09 DIAGNOSIS — C55 Malignant neoplasm of uterus, part unspecified: Secondary | ICD-10-CM

## 2022-09-09 DIAGNOSIS — M545 Low back pain, unspecified: Secondary | ICD-10-CM | POA: Diagnosis not present

## 2022-09-09 MED ORDER — METHYLPHENIDATE HCL 10 MG PO TABS: 10.0000 mg | ORAL_TABLET | Freq: Every morning | ORAL | 0 refills | Status: DC

## 2022-09-09 NOTE — Assessment & Plan Note (Signed)
Multifactorial Neurology appt is pending

## 2022-09-09 NOTE — Progress Notes (Signed)
Subjective:  Patient ID: [name], female    DOB: 07-10-1950  Age: 72 y.o. MRN: [id number]  CC: No chief complaint on file.   HPI [name] presents for CFS - worse, depression, R hip pain - worse; stiff Still off balance; pt fell 2-3 times  Outpatient Medications Prior to Visit  Medication Sig Dispense Refill   acetaminophen (TYLENOL) 325 MG tablet Take 2 tablets (650 mg total) by mouth every 6 (six) hours as needed for moderate pain. (Patient taking differently: Take 500 mg by mouth every 6 (six) hours as needed for moderate pain. Two at bedtime) 90 tablet 2   ALPRAZolam (XANAX) 1 MG tablet Take 1 tablet (1 mg total) by mouth 3 (three) times daily as needed for anxiety. 90 tablet 5   aspirin EC 81 MG tablet Take 1 tablet (81 mg total) by mouth daily. 30 tablet 3   busPIRone (BUSPAR) 30 MG tablet TAKE 1 TABLET BY MOUTH TWICE  DAILY 200 tablet 1   Cholecalciferol (D3 5000) 125 MCG (5000 UT) capsule Take 5,000 Units by mouth daily.     ciprofloxacin (CIPRO) 500 MG tablet Take 1 tablet (500 mg total) by mouth 2 (two) times daily. for 10 days (Patient taking differently: Take 500 mg by mouth 2 (two) times daily. PRN for UTI's) 20 tablet 0   colesevelam (WELCHOL) 625 MG tablet Take 2 tablets (1,250 mg total) by mouth 2 (two) times daily with a meal. (Patient taking differently: Take 1,250 mg by mouth 2 (two) times daily with a meal. 1 Tablet BID) 360 tablet 3   D-Mannose 500 MG CAPS Take 500 mg by mouth 2 (two) times daily. WITH CRANBERRY & DANDELION EXTRACT 180 capsule 3   diphenoxylate-atropine (LOMOTIL) 2.5-0.025 MG tablet TAKE 1 OR 2 TABLETS BY MOUTH 4 TIMES A DAY AS NEEDED FOR DIARRHEA/LOSSE STOOLS 60 tablet 1   esomeprazole (NEXIUM) 40 MG capsule TAKE 1 CAPSULE (40 MG TOTAL) BY MOUTH EVERY MORNING. 90 capsule 1   FLUoxetine (PROZAC) 40 MG capsule TAKE 1 CAPSULE BY MOUTH IN THE  MORNING 90 capsule 3   furosemide (LASIX) 20 MG tablet TAKE 1 OR 2 TABLETS BY MOUTH EVERY  DAY AS NEEDED FOR EDEMA AS DIRECTED (Patient taking differently: 40 mg daily) 200 tablet 0   hydrochlorothiazide (MICROZIDE) 12.5 MG capsule Take 1 capsule (12.5 mg total) by mouth daily. 90 capsule 3   ipratropium-albuterol (DUONEB) 0.5-2.5 (3) MG/3ML SOLN Take 3 mLs by nebulization every 6 (six) hours as needed (Asthma).     iron polysaccharides (FERREX 150) 150 MG capsule Take 1 capsule (150 mg total) by mouth 2 (two) times daily. 180 capsule 3   levothyroxine (SYNTHROID) 100 MCG tablet Take 1 tablet (100 mcg total) by mouth daily before breakfast. 90 tablet 3   loperamide (IMODIUM A-D) 2 MG tablet Take 2 mg by mouth 4 (four) times daily as needed for diarrhea or loose stools.     METAMUCIL FIBER PO Take 1 capsule by mouth in the morning and at bedtime.     mirtazapine (REMERON) 30 MG tablet TAKE 1 TABLET BY MOUTH AT  BEDTIME 90 tablet 3   nebivolol (BYSTOLIC) 10 MG tablet Take 1 tablet (10 mg total) by mouth daily. 90 tablet 3   nebivolol (BYSTOLIC) 5 MG tablet Take 1 tablet (5 mg total) by mouth 2 (two) times daily. 180 tablet 3   omega-3 acid ethyl esters (LOVAZA) 1 g capsule TAKE 2 CAPSULES BY MOUTH 2 TIMES  DAILY. 360 capsule 1   ondansetron (ZOFRAN-ODT) 4 MG disintegrating tablet TAKE 1 TABLET BY MOUTH EVERY 8 HOURS AS NEEDED FOR NAUSEA AND VOMITING     OVER THE COUNTER MEDICATION Take 1 capsule by mouth daily. 7 MUSHROOM BLEND     Polyethyl Glycol-Propyl Glycol (SYSTANE OP) Place 1 drop into both eyes 2 (two) times daily as needed (dry eyes).      SUNSCREEN SPF30 EX      traMADol (ULTRAM) 50 MG tablet Take 1 tablet (50 mg total) by mouth every 6 (six) hours as needed for moderate pain. 100 tablet 1   vitamin B-12 (CYANOCOBALAMIN) 1000 MCG tablet Take 1,000 mcg by mouth daily.     vitamin C (ASCORBIC ACID) 500 MG tablet Take 500 mg by mouth daily.     methylphenidate (RITALIN) 10 MG tablet Take 1 tablet (10 mg total) by mouth in the morning. 90 tablet 0   hydrALAZINE (APRESOLINE) 10 MG  tablet Take 1 tablet (10 mg total) by mouth 4 (four) times daily as needed. (Patient not taking: Reported on 08/26/2022) 100 tablet 3   No facility-administered medications prior to visit.    ROS: Review of Systems  Constitutional:  Negative for activity change, appetite change, chills, fatigue and unexpected weight change.  HENT:  Negative for congestion, mouth sores and sinus pressure.   Eyes:  Negative for visual disturbance.  Respiratory:  Negative for cough and chest tightness.   Gastrointestinal:  Negative for abdominal pain and nausea.  Genitourinary:  Negative for difficulty urinating, frequency and vaginal pain.  Musculoskeletal:  Positive for arthralgias and gait problem. Negative for back pain.  Skin:  Negative for pallor and rash.  Neurological:  Negative for dizziness, tremors, weakness, numbness and headaches.  Psychiatric/Behavioral:  Negative for confusion, sleep disturbance and suicidal ideas. The patient is nervous/anxious.     Objective:  BP (!) 168/92 (BP Location: Left Arm, Patient Position: Sitting, Cuff Size: Normal)   Pulse 60   Temp 97.6 F (36.4 C) (Oral)   Ht 5\' 2"  (1.575 m)   Wt 160 lb (72.6 kg)   SpO2 99%   BMI 29.26 kg/m   BP Readings from Last 3 Encounters:  09/09/22 (!) 168/92  08/26/22 (!) 170/98  08/16/22 124/80    Wt Readings from Last 3 Encounters:  09/09/22 160 lb (72.6 kg)  08/26/22 155 lb (70.3 kg)  08/16/22 158 lb (71.7 kg)    Physical Exam Constitutional:      General: She is not in acute distress.    Appearance: She is well-developed. She is obese.  HENT:     Head: Normocephalic.     Right Ear: External ear normal.     Left Ear: External ear normal.     Nose: Nose normal.  Eyes:     General:        Right eye: No discharge.        Left eye: No discharge.     Conjunctiva/sclera: Conjunctivae normal.     Pupils: Pupils are equal, round, and reactive to light.  Neck:     Thyroid: No thyromegaly.     Vascular: No JVD.      Trachea: No tracheal deviation.  Cardiovascular:     Rate and Rhythm: Normal rate and regular rhythm.     Heart sounds: Normal heart sounds.  Pulmonary:     Effort: No respiratory distress.     Breath sounds: No stridor. No wheezing.  Abdominal:     General:  Bowel sounds are normal. There is no distension.     Palpations: Abdomen is soft. There is no mass.     Tenderness: There is no abdominal tenderness. There is no right CVA tenderness, guarding or rebound.  Musculoskeletal:        General: Tenderness present.     Cervical back: Normal range of motion and neck supple. No rigidity.  Lymphadenopathy:     Cervical: No cervical adenopathy.  Skin:    Findings: No erythema or rash.  Neurological:     Mental Status: She is oriented to person, place, and time.     Cranial Nerves: No cranial nerve deficit.     Motor: No abnormal muscle tone.     Coordination: Coordination normal.     Deep Tendon Reflexes: Reflexes normal.  Psychiatric:        Behavior: Behavior normal.        Thought Content: Thought content normal.        Judgment: Judgment normal.    R hip - stiff w/pain   Lab Results  Component Value Date   WBC 7.6 03/19/2022   HGB 12.4 03/19/2022   HCT 36.7 03/19/2022   PLT 348 03/19/2022   GLUCOSE 83 08/16/2022   CHOL 263 (H) 10/18/2018   TRIG 271.0 (H) 10/18/2018   HDL 45.40 10/18/2018   LDLDIRECT 160.0 10/18/2018   LDLCALC UNABLE TO CALCULATE IF TRIGLYCERIDE OVER 400 mg/dL 40/98/1191   ALT 25 47/82/9562   AST 30 08/16/2022   NA 136 08/16/2022   K 4.0 08/16/2022   CL 101 08/16/2022   CREATININE 1.39 (H) 08/16/2022   BUN 28 (H) 08/16/2022   CO2 28 08/16/2022   TSH 2.44 08/16/2022   INR 1.0 07/15/2020   HGBA1C 5.8 03/01/2022   MICROALBUR 0.8 12/16/2021    CT CHEST ABDOMEN PELVIS W CONTRAST  Result Date: 07/25/2022 CLINICAL DATA:  Uterine/cervical cancer.  Monitor. EXAM: CT CHEST, ABDOMEN, AND PELVIS WITH CONTRAST TECHNIQUE: Multidetector CT imaging of the  chest, abdomen and pelvis was performed following the standard protocol during bolus administration of intravenous contrast. RADIATION DOSE REDUCTION: This exam was performed according to the departmental dose-optimization program which includes automated exposure control, adjustment of the mA and/or kV according to patient size and/or use of iterative reconstruction technique. CONTRAST:  OMNIPAQUE IOHEXOL 300 MG/ML  SOLN COMPARISON:  CT of the chest abdomen pelvis dated 08/04/2021. FINDINGS: CT CHEST FINDINGS Cardiovascular: There is no cardiomegaly or pericardial effusion. Mild atherosclerotic calcification of the thoracic aorta. No aneurysmal dilatation or dissection. The origins of the great vessels of the aortic arch and the central pulmonary arteries appear patent. Mediastinum/Nodes: No hilar or mediastinal adenopathy. The esophagus is grossly unremarkable. No mediastinal fluid collection. Lungs/Pleura: Postsurgical changes of the right upper lobe with linear scarring. Additional subpleural scarring noted in the right lower lobe. No consolidative changes. There is no pleural effusion pneumothorax. The central airways are patent. Musculoskeletal: No acute osseous pathology.  Lower cervical ACDF. CT ABDOMEN PELVIS FINDINGS No intra-abdominal free air or free fluid. Hepatobiliary: The liver is unremarkable. Mild biliary dilatation, post cholecystectomy. No retained calcified stone noted in the central CBD. Pancreas: Unremarkable. No pancreatic ductal dilatation or surrounding inflammatory changes. Spleen: Normal in size without focal abnormality. Adrenals/Urinary Tract: The adrenal glands unremarkable. There is a 4 mm nonobstructing left renal inferior pole calculus. Mild bilateral renal parenchyma atrophy. There is no hydronephrosis on either side. There is symmetric enhancement and excretion of contrast by both kidneys. The visualized  ureters appear unremarkable. The urinary bladder is mildly distended.  Small pocket of air in the anterior bladder likely related to recent instrumentation. Clinical correlation is recommended. Stomach/Bowel: There is moderate stool throughout the colon. There is no bowel obstruction or active inflammation. The appendix is normal. Vascular/Lymphatic: The abdominal aorta and IVC are unremarkable. No portal venous gas. There is no adenopathy. Reproductive: Hysterectomy.  No adnexal masses. Other: Postsurgical changes of the pelvic wall. Musculoskeletal: Degenerative changes of the spine. No acute osseous pathology. IMPRESSION: 1. No acute intrathoracic, abdominal, or pelvic pathology. No evidence of metastatic disease in the chest, abdomen, or pelvis. 2. A 4 mm nonobstructing left renal inferior pole calculus. No hydronephrosis. 3. Moderate colonic stool burden. No bowel obstruction. Normal appendix. 4.  Aortic Atherosclerosis (ICD10-I70.0). Electronically Signed   By: Elgie Collard M.D.   On: 07/25/2022 01:29    Assessment & Plan:   Problem List Items Addressed This Visit     Leiomyosarcoma of uterus (HCC)    Head CT      Relevant Orders   CT HEAD WO CONTRAST ( )   Major depressive disorder, recurrent, severe without psychotic features (HCC) - Primary    Seeing Dr Donell Beers, Dr Dellia Cloud On Mirtazipine, Buspar, Fluoxetine, Xanax prn On Ritalin - it is helping      Post concussion syndrome   Cephalalgia   Relevant Orders   CT HEAD WO CONTRAST ( )   Falls frequently    Multifactorial Neurology appt is pending       Relevant Orders   CT HEAD WO CONTRAST ( )   Hip pain, chronic, right    Worse X ray R hip      Relevant Orders   XR HIP UNILAT W OR W/O PELVIS 2-3 VIEWS RIGHT   Other Visit Diagnoses     BIPLR I MOST RECENT EPIS DPRS SEV NO PSYCHOT BHV             Meds ordered this encounter  Medications   methylphenidate (RITALIN) 10 MG tablet    Sig: Take 1 tablet (10 mg total) by mouth in the morning.    Dispense:  90 tablet     Refill:  0    A 3 months supply  - fill every 90 days      Follow-up: No follow-ups on file.  Sonda Primes, MD

## 2022-09-09 NOTE — Assessment & Plan Note (Addendum)
Seeing Dr Donell Beers, Dr Dellia Cloud On Mirtazipine, Buspar, Fluoxetine, Xanax prn On Ritalin - it is helping

## 2022-09-09 NOTE — Assessment & Plan Note (Signed)
Worse X ray R hip

## 2022-09-09 NOTE — Assessment & Plan Note (Signed)
Head CT 

## 2022-09-14 ENCOUNTER — Telehealth: Payer: Self-pay | Admitting: Internal Medicine

## 2022-09-14 DIAGNOSIS — G8929 Other chronic pain: Secondary | ICD-10-CM

## 2022-09-14 NOTE — Telephone Encounter (Signed)
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 She would like a call to discuss next steps. Best callback is [phone].

## 2022-09-15 ENCOUNTER — Ambulatory Visit (INDEPENDENT_AMBULATORY_CARE_PROVIDER_SITE_OTHER): Payer: 59 | Admitting: Psychology

## 2022-09-15 ENCOUNTER — Encounter: Payer: Self-pay | Admitting: Internal Medicine

## 2022-09-15 ENCOUNTER — Ambulatory Visit: Payer: 59 | Admitting: Student

## 2022-09-15 DIAGNOSIS — F4323 Adjustment disorder with mixed anxiety and depressed mood: Secondary | ICD-10-CM

## 2022-09-15 NOTE — Progress Notes (Signed)
[name] Time: 4:10p-5:00 p50 minutes   [name] is a 72 y.o. female patient  Date: 09/15/2022  Treatment Plan Diagnosis F43.21 (Adjustment Disorder, With depressed mood) [n/a]  Symptoms A diagnosis of an acute, serious illness that is life-threatening. (Status: maintained) -- No Description Entered  Diminished interest in or enjoyment of activities. (Status: maintained) -- No Description Entered  Lack of energy. (Status: maintained) -- No Description Entered  Sad affect, social withdrawal, anxiety, loss of interest in activities, and low energy. (Status: maintained) -- No Description Entered  Medication Status compliance  Safety unspecified If Suicidal or Homicidal State Action Taken: unspecified  Current Risk:  Medications Buspar (Dosage: 30mg  2X/day)  New Medication (Dosage: 250mg  2x/day)  New Medication (Dosage: 250mg  2x/day)  Prozac (Dosage: 40mg  daily)  Remeron (Dosage: 30mg  nightly)  xanax (Dosage: .5mg  2-3X/day)  Objectives Related Problem: Recognize, accept, and cope with feelings of depression. Description: Identify and replace thoughts and beliefs that support depression. Target Date: 2022-10-28 Frequency: Daily Modality: individual Progress: 75%  Related Problem: Recognize, accept, and cope with feelings of depression. Description: Verbalize an understanding of healthy and unhealthy emotions with the intent of increasing the use of healthy emotions to guide actions. Target Date: 2022-10-28 Frequency: Daily Modality: individual Progress: 75%  Related Problem: Recognize, accept, and cope with feelings of depression. Description: Increasingly verbalize hopeful and positive statements regarding self, others, and the future. Target Date: 2022-04-28 Frequency: Daily Modality: individual Progress: 100%  Related Problem: Reduce fear, anxiety, and worry associated with the medical  condition. Description: Share with significant others efforts to adapt successfully to the medical condition/diagnosis. Target Date: 2022-10-28 Frequency: Daily Modality: individual Progress: 85%  Related Problem: Reduce fear, anxiety, and worry associated with the medical condition. Description: Engage in social, productive, and recreational activities that are possible in spite of medical condition. Target Date: 2022-10-28 Frequency: Daily Modality: individual Progress: 90%  Related Problem: Reduce fear, anxiety, and worry associated with the medical condition. Description: Identify the coping skills and sources of emotional support that have been beneficial in the past. Target Date: 2022-10-28 Frequency: Daily Modality: individual Progress: 75%  Client Response full compliance  Service Location Location, [address] [place], [place] [address]  Service Code cpt [phone]  Related past to present  Facilitate problem solving  Identify/label emotions  Validate/empathize  Self care activities  Lifestyle change (exercise, nutrition)  Assess/facilitate readiness to change  Relaxation training  Rationally challenge thoughts or beliefs/cognitive restructuring  Comments  Dx: F43.21  Meds: Prozac, [name], patient is struggling to rebuild her life and adjust to her new circumstances. She suffers a negative self-concept and has little confidence in her judgement. She is seeking to reduce symptoms of anxiety and self-doubt and to re-define satisfa ction moving forward. Therapy will focus on cognitive restructuring to redefine her experiences and focus on those satisfying aspects of her life. Will target this goal completion for April1, 2021 (completed). She is also wanting to utilize counseling to adjust and accept her latest diagnosis of cancer. She wants assistance to better manage this news and remain positive throughout  treatment. Goal date is 12-22. Patient has made progress, but her medical condition is variable and she requests additional treatment to manage moods throughout her  medical ordeal. In addition, she is trying to manage feelings associated with her older son's mental health struggles. Revised goal date is 6-24.  Patient agrees to have a video session. She is at home and I am in home office.   [name] says that she is aware she has a sore hip and got an x-ray. 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 Worse than she anticipated. She is thinking it is likely she will need a hip replacement. Other than her hip, she is feeling more physically stable. She has been talking with her son [name] on a regular cadence and it has been gratifying. His success helps her feel better about all of the struggles with her other son.                                                                                                                                                   [name] Time: 3:15p-4:00p 45 minutes

## 2022-09-15 NOTE — Telephone Encounter (Signed)
We can prescribe prednisone now to relieve symptoms, if [name] is willing to take it. She will need to see an orthopedist.  She may benefit from right hip joint injection.  Would you like me to refer her?  If yes, to refer to see what orthopedist? Thanks

## 2022-09-15 NOTE — Telephone Encounter (Signed)
Notified pt w/ MD response. Pt states she would like the rx for prednisone, and would like referral to ortho.Marland KitchenRaechel Chute

## 2022-09-16 ENCOUNTER — Other Ambulatory Visit: Payer: Self-pay | Admitting: Nurse Practitioner

## 2022-09-16 DIAGNOSIS — I10 Essential (primary) hypertension: Secondary | ICD-10-CM

## 2022-09-17 MED ORDER — METHYLPREDNISOLONE 4 MG PO TBPK: ORAL_TABLET | ORAL | 0 refills | Status: DC

## 2022-09-17 NOTE — Telephone Encounter (Signed)
Okay.  Will do.  Thanks °

## 2022-09-20 ENCOUNTER — Ambulatory Visit (INDEPENDENT_AMBULATORY_CARE_PROVIDER_SITE_OTHER): Payer: 59 | Admitting: Psychology

## 2022-09-20 ENCOUNTER — Other Ambulatory Visit: Payer: 59

## 2022-09-20 DIAGNOSIS — F4323 Adjustment disorder with mixed anxiety and depressed mood: Secondary | ICD-10-CM

## 2022-09-20 NOTE — Progress Notes (Signed)
[name] Time: 4:10p-5:00 p50 minutes   [name] is a 72 y.o. female patient  Date: 09/20/2022  Treatment Plan Diagnosis F43.21 (Adjustment Disorder, With depressed mood) [n/a]  Symptoms A diagnosis of an acute, serious illness that is life-threatening. (Status: maintained) -- No Description Entered  Diminished interest in or enjoyment of activities. (Status: maintained) -- No Description Entered  Lack of energy. (Status: maintained) -- No Description Entered  Sad affect, social withdrawal, anxiety, loss of interest in activities, and low energy. (Status: maintained) -- No Description Entered  Medication Status compliance  Safety unspecified If Suicidal or Homicidal State Action Taken: unspecified  Current Risk:  Medications Buspar (Dosage: 30mg  2X/day)  New Medication (Dosage: 250mg  2x/day)  New Medication (Dosage: 250mg  2x/day)  Prozac (Dosage: 40mg  daily)  Remeron (Dosage: 30mg  nightly)  xanax (Dosage: .5mg  2-3X/day)  Objectives Related Problem: Recognize, accept, and cope with feelings of depression. Description: Identify and replace thoughts and beliefs that support depression. Target Date: 2022-10-28 Frequency: Daily Modality: individual Progress: 75%  Related Problem: Recognize, accept, and cope with feelings of depression. Description: Verbalize an understanding of healthy and unhealthy emotions with the intent of increasing the use of healthy emotions to guide actions. Target Date: 2022-10-28 Frequency: Daily Modality: individual Progress: 75%  Related Problem: Recognize, accept, and cope with feelings of depression. Description: Increasingly verbalize hopeful and positive statements regarding self, others, and the future. Target Date: 2022-04-28 Frequency: Daily Modality: individual Progress: 100%  Related Problem: Reduce fear, anxiety, and worry  associated with the medical condition. Description: Share with significant others efforts to adapt successfully to the medical condition/diagnosis. Target Date: 2022-10-28 Frequency: Daily Modality: individual Progress: 85%  Related Problem: Reduce fear, anxiety, and worry associated with the medical condition. Description: Engage in social, productive, and recreational activities that are possible in spite of medical condition. Target Date: 2022-10-28 Frequency: Daily Modality: individual Progress: 90%  Related Problem: Reduce fear, anxiety, and worry associated with the medical condition. Description: Identify the coping skills and sources of emotional support that have been beneficial in the past. Target Date: 2022-10-28 Frequency: Daily Modality: individual Progress: 75%  Client Response full compliance  Service Location Location, [address] [place], [place] [address]  Service Code cpt [phone]  Related past to present  Facilitate problem solving  Identify/label emotions  Validate/empathize  Self care activities  Lifestyle change (exercise, nutrition)  Assess/facilitate readiness to change  Relaxation training  Rationally challenge thoughts or beliefs/cognitive restructuring  Comments  Dx: F43.21  Meds: Prozac, [name], patient is struggling to rebuild her life and adjust to her new circumstances. She suffers a negative self-concept and has little confidence in her judgement. She is seeking to reduce symptoms of anxiety and self-doubt and to re-define satisfa ction moving forward. Therapy will focus on cognitive restructuring to redefine her experiences and focus on those satisfying aspects of her life. Will target this goal completion for April1, 2021 (completed). She is also wanting to utilize counseling to adjust and accept her latest diagnosis of cancer. She wants assistance to better manage this news and  remain positive throughout treatment. Goal date is 12-22. Patient has made progress, but  her medical condition is variable and she requests additional treatment to manage moods throughout her medical ordeal. In addition, she is trying to manage feelings associated with her older son's mental health struggles. Revised goal date is 6-24.  Patient agrees to have a video session. She is at home and I am in home office.   [name] she is feeling both anxious and depressed. She continues to have high blood pressure and is working with her doctors to bring it down. She tends to have strong emotional reactions when her pressure is high like this. She is frustrated and it contributes to a feeling of being "unwell". When she feels like this, her OCD and confusion is much worse. Does not feel it is a UTI and feels like there is some other physical problem. Will see primary care tomorrow and cardiologist on Thursday.                                                                                                                                                       [name] Time: 4:15p-5:00p 45 minutes

## 2022-09-21 ENCOUNTER — Ambulatory Visit: Payer: 59 | Admitting: Internal Medicine

## 2022-09-23 ENCOUNTER — Ambulatory Visit: Payer: 59 | Attending: Cardiology | Admitting: Cardiology

## 2022-09-23 ENCOUNTER — Encounter: Payer: Self-pay | Admitting: Cardiology

## 2022-09-23 ENCOUNTER — Telehealth: Payer: Self-pay | Admitting: Internal Medicine

## 2022-09-23 VITALS — BP 156/98 | HR 64 | Ht 62.0 in | Wt 159.4 lb

## 2022-09-23 DIAGNOSIS — E7849 Other hyperlipidemia: Secondary | ICD-10-CM

## 2022-09-23 DIAGNOSIS — R0683 Snoring: Secondary | ICD-10-CM

## 2022-09-23 DIAGNOSIS — I10 Essential (primary) hypertension: Secondary | ICD-10-CM

## 2022-09-23 DIAGNOSIS — R400 Somnolence: Secondary | ICD-10-CM

## 2022-09-23 MED ORDER — CARVEDILOL 6.25 MG PO TABS: 6.2500 mg | ORAL_TABLET | Freq: Two times a day (BID) | ORAL | 3 refills | Status: DC

## 2022-09-23 MED ORDER — HYDROCHLOROTHIAZIDE 25 MG PO TABS: 25.0000 mg | ORAL_TABLET | Freq: Every day | ORAL | 3 refills | Status: DC

## 2022-09-23 NOTE — Telephone Encounter (Signed)
Pt called wanting Dr. Macario Golds to order MRI for an upcoming procedure she is having with Dr.Ryan with [facility].

## 2022-09-23 NOTE — Patient Instructions (Addendum)
Medication Instructions:  Your physician has recommended you make the following change in your medication:  STOP: Nebivolol (Bystolic) INCREASE: Hydrochlorothiazide (Hydrodiuril) 25 mg once daily START: Carvedilol (Coreg) 6.25 mg twice daily *If you need a refill on your cardiac medications before your next appointment, please call your pharmacy*   Lab Work: Your physician recommends that you have labs drawn: Lipids  If you have labs (blood work) drawn today and your tests are completely normal, you will receive your results only by: MyChart Message (if you have MyChart) OR A paper copy in the mail If you have any lab test that is abnormal or we need to change your treatment, we will call you to review the results.   Testing/Procedures: Your physician has recommended that you have a sleep study. This test records several body functions during sleep, including: brain activity, eye movement, oxygen and carbon dioxide blood levels, heart rate and rhythm, breathing rate and rhythm, the flow of air through your mouth and nose, snoring, body muscle movements, and chest and belly movement.    Follow-Up: At [facility], you and your health needs are our priority.  As part of our continuing mission to provide you with exceptional heart care, we have created designated Provider Care Teams.  These Care Teams include your primary Cardiologist (physician) and Advanced Practice Providers (APPs -  Physician Assistants and Nurse Practitioners) who all work together to provide you with the care you need, when you need it.   Your next appointment:   4 month(s)  Provider:   Thomasene Ripple, DO    Other instructions: Please see our pharmacy staff in 4 weeks.
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
## 2022-09-24 ENCOUNTER — Encounter: Payer: Self-pay | Admitting: Cardiology

## 2022-09-24 LAB — LIPID PANEL
Chol/HDL Ratio: 5.4 ratio — ABNORMAL HIGH (ref 0.0–4.4)
Cholesterol, Total: 277 mg/dL — ABNORMAL HIGH (ref 100–199)
HDL: 51 mg/dL (ref >39)
LDL Chol Calc (NIH): 174 mg/dL — ABNORMAL HIGH (ref 0–99)
Triglycerides: 276 mg/dL — ABNORMAL HIGH (ref 0–149)
VLDL Cholesterol Cal: 52 mg/dL — ABNORMAL HIGH (ref 5–40)

## 2022-09-24 MED ORDER — ROSUVASTATIN CALCIUM 10 MG PO TABS: 10.0000 mg | ORAL_TABLET | Freq: Every day | ORAL | 3 refills | Status: DC

## 2022-09-27 ENCOUNTER — Ambulatory Visit
Admission: RE | Admit: 2022-09-27 | Discharge: 2022-09-27 | Disposition: A | Discharge status: Home / Self Care | Payer: 59 | Source: Ambulatory Visit | Attending: Internal Medicine | Admitting: Internal Medicine

## 2022-09-27 DIAGNOSIS — G8929 Other chronic pain: Secondary | ICD-10-CM

## 2022-09-27 DIAGNOSIS — R270 Ataxia, unspecified: Secondary | ICD-10-CM | POA: Diagnosis not present

## 2022-09-27 DIAGNOSIS — R519 Headache, unspecified: Secondary | ICD-10-CM | POA: Diagnosis not present

## 2022-09-27 DIAGNOSIS — C55 Malignant neoplasm of uterus, part unspecified: Secondary | ICD-10-CM

## 2022-09-27 DIAGNOSIS — R296 Repeated falls: Secondary | ICD-10-CM

## 2022-09-27 NOTE — Telephone Encounter (Signed)
[facility] has the your own MRI.  They order there MRIs. Thanks

## 2022-09-27 NOTE — Telephone Encounter (Signed)
Called pt no answer LMOM w/MD response../lmb 

## 2022-09-29 ENCOUNTER — Ambulatory Visit (INDEPENDENT_AMBULATORY_CARE_PROVIDER_SITE_OTHER): Payer: 59 | Admitting: Psychology

## 2022-09-29 DIAGNOSIS — F4323 Adjustment disorder with mixed anxiety and depressed mood: Secondary | ICD-10-CM | POA: Diagnosis not present

## 2022-09-29 NOTE — Progress Notes (Signed)
[name] Time: 4:10p-5:00 p50 minutes   [name] is a 72 y.o. 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 (Status: maintained) -- No Description Entered  Diminished interest in or enjoyment of activities. (Status: maintained) -- No Description Entered  Lack of energy. (Status: maintained) -- No Description Entered  Sad affect, social withdrawal, anxiety, loss of interest in activities, and low energy. (Status: maintained) -- No Description Entered  Medication Status compliance  Safety unspecified If Suicidal or Homicidal State Action Taken: unspecified  Current Risk:  Medications Buspar (Dosage: 30mg  2X/day)  New Medication (Dosage: 250mg  2x/day)  New Medication (Dosage: 250mg  2x/day)  Prozac (Dosage: 40mg  daily)  Remeron (Dosage: 30mg  nightly)  xanax (Dosage: .5mg  2-3X/day)  Objectives Related Problem: Recognize, accept, and cope with feelings of depression. Description: Identify and replace thoughts and beliefs that support depression. Target Date: 2022-10-28 Frequency: Daily Modality: individual Progress: 75%  Related Problem: Recognize, accept, and cope with feelings of depression. Description: Verbalize an understanding of healthy and unhealthy emotions with the intent of increasing the use of healthy emotions to guide actions. Target Date: 2022-10-28 Frequency: Daily Modality: individual Progress: 75%  Related Problem: Recognize, accept, and cope with feelings of depression. Description: Increasingly verbalize hopeful and positive statements regarding self, others, and the future. Target Date: 2022-04-28 Frequency: Daily Modality: individual Progress: 100%  Related Problem:  Reduce fear, anxiety, and worry associated with the medical condition. Description: Share with significant others efforts to adapt successfully to the medical condition/diagnosis. Target Date: 2022-10-28 Frequency: Daily Modality: individual Progress: 85%  Related Problem: Reduce fear, anxiety, and worry associated with the medical condition. Description: Engage in social, productive, and recreational activities that are possible in spite of medical condition. Target Date: 2022-10-28 Frequency: Daily Modality: individual Progress: 90%  Related Problem: Reduce fear, anxiety, and worry associated with the medical condition. Description: Identify the coping skills and sources of emotional support that have been beneficial in the past. Target Date: 2022-10-28 Frequency: Daily Modality: individual Progress: 75%  Client Response full compliance  Service Location Location, [address] [place], [place] [address]  Service Code cpt [phone]  Related past to present  Facilitate problem solving  Identify/label emotions  Validate/empathize  Self care activities  Lifestyle change (exercise, nutrition)  Assess/facilitate readiness to change  Relaxation training  Rationally challenge thoughts or beliefs/cognitive restructuring  Comments  Dx: F43.21  Meds: Prozac, [name], patient is struggling to rebuild her life and adjust to her new circumstances. She suffers a negative self-concept and has little confidence in her judgement. She is seeking to reduce symptoms of anxiety and self-doubt and to re-define satisfa ction moving forward. Therapy will focus on cognitive restructuring to redefine her experiences and focus on those satisfying aspects of her life. Will target this goal completion for April1, 2021 (completed). She is also wanting to utilize counseling to adjust and accept her latest diagnosis of cancer. She wants assistance to  better manage this  news and remain positive throughout treatment. Goal date is 12-22. Patient has made progress, but her medical condition is variable and she requests additional treatment to manage moods throughout her medical ordeal. In addition, she is trying to manage feelings associated with her older son's mental health struggles. Revised goal date is 6-24.  Patient agrees to have a video session. She is at home and I am in home office.   [name] is not feeling emotionally or physically good. She has been focused on the lack of information about her son [name]. Last info she had was about 5 months ago, saying that he was at the beach. She met with her son [name], who is struggling with hs work. He tells her he doesn't have time for anything or anybody. [name] is feeling very alone. This did not bother her in the past, but it does not feel good right now. Talked to her about trying to get to a yoga class in person rather than just on line.                                                                                                                                                         Thaer Miyoshi Grant Time: 3:15p-4:00p 45 minutes

## 2022-09-30 ENCOUNTER — Encounter: Payer: Self-pay | Admitting: Internal Medicine

## 2022-09-30 ENCOUNTER — Ambulatory Visit (INDEPENDENT_AMBULATORY_CARE_PROVIDER_SITE_OTHER): Payer: 59 | Admitting: Internal Medicine

## 2022-09-30 VITALS — BP 132/82 | HR 74 | Temp 98.2°F | Ht 62.0 in | Wt 160.0 lb

## 2022-09-30 DIAGNOSIS — E7849 Other hyperlipidemia: Secondary | ICD-10-CM | POA: Diagnosis not present

## 2022-09-30 DIAGNOSIS — R296 Repeated falls: Secondary | ICD-10-CM | POA: Diagnosis not present

## 2022-09-30 DIAGNOSIS — G8929 Other chronic pain: Secondary | ICD-10-CM | POA: Diagnosis not present

## 2022-09-30 DIAGNOSIS — I700 Atherosclerosis of aorta: Secondary | ICD-10-CM

## 2022-09-30 DIAGNOSIS — M25551 Pain in right hip: Secondary | ICD-10-CM | POA: Diagnosis not present

## 2022-09-30 MED ORDER — METHYLPHENIDATE HCL 10 MG PO TABS: 10.0000 mg | ORAL_TABLET | Freq: Every morning | ORAL | 0 refills | Status: DC

## 2022-09-30 MED ORDER — TRAMADOL HCL 50 MG PO TABS: 50.0000 mg | ORAL_TABLET | Freq: Four times a day (QID) | ORAL | 1 refills | Status: DC | PRN

## 2022-09-30 NOTE — Progress Notes (Signed)
Subjective:  Patient ID: [name], female    DOB: [birth year]  Age: 72 y.o. MRN: [id number]  CC: Follow-up (Look into Lipid panel results)   HPI [name] presents for dyslipidemia - Dr Servando Salina started Crestor F/u on hip pain - taking Tylenol and Tramadol  Outpatient Medications Prior to Visit  Medication Sig Dispense Refill  . acetaminophen (TYLENOL) 325 MG tablet Take 2 tablets (650 mg total) by mouth every 6 (six) hours as needed for moderate pain. 90 tablet 2  . ALPRAZolam (XANAX) 1 MG tablet Take 1 tablet (1 mg total) by mouth 3 (three) times daily as needed for anxiety. 90 tablet 5  . aspirin EC 81 MG tablet Take 1 tablet (81 mg total) by mouth daily. 30 tablet 3  . busPIRone (BUSPAR) 30 MG tablet TAKE 1 TABLET BY MOUTH TWICE  DAILY 200 tablet 1  . carvedilol (COREG) 6.25 MG tablet Take 1 tablet (6.25 mg total) by mouth 2 (two) times daily. 180 tablet 3  . Cholecalciferol (D3 5000) 125 MCG (5000 UT) capsule Take 5,000 Units by mouth daily.    . ciprofloxacin (CIPRO) 500 MG tablet Take 1 tablet (500 mg total) by mouth 2 (two) times daily. for 10 days 20 tablet 0  . D-Mannose 500 MG CAPS Take 500 mg by mouth 2 (two) times daily. WITH CRANBERRY & DANDELION EXTRACT 180 capsule 3  . diphenoxylate-atropine (LOMOTIL) 2.5-0.025 MG tablet TAKE 1 OR 2 TABLETS BY MOUTH 4 TIMES A DAY AS NEEDED FOR DIARRHEA/LOSSE STOOLS 60 tablet 1  . esomeprazole (NEXIUM) 40 MG capsule TAKE 1 CAPSULE (40 MG TOTAL) BY MOUTH EVERY MORNING. 90 capsule 1  . FLUoxetine (PROZAC) 40 MG capsule TAKE 1 CAPSULE BY MOUTH IN THE  MORNING 90 capsule 3  . hydrochlorothiazide (HYDRODIURIL) 25 MG tablet Take 1 tablet (25 mg total) by mouth daily. 90 tablet 3  . ipratropium-albuterol (DUONEB) 0.5-2.5 (3) MG/3ML SOLN Take 3 mLs by nebulization every 6 (six) hours as needed (Asthma).    . iron polysaccharides (FERREX 150) 150 MG capsule Take 1 capsule (150 mg total) by mouth 2 (two) times daily. 180 capsule 3   . levothyroxine (SYNTHROID) 100 MCG tablet Take 1 tablet (100 mcg total) by mouth daily before breakfast. 90 tablet 3  . loperamide (IMODIUM A-D) 2 MG tablet Take 2 mg by mouth 4 (four) times daily as needed for diarrhea or loose stools.    Marland Kitchen METAMUCIL FIBER PO Take 1 capsule by mouth in the morning and at bedtime.    . methylPREDNISolone (MEDROL DOSEPAK) 4 MG TBPK tablet As directed 21 tablet 0  . mirtazapine (REMERON) 30 MG tablet TAKE 1 TABLET BY MOUTH AT  BEDTIME 90 tablet 3  . omega-3 acid ethyl esters (LOVAZA) 1 g capsule TAKE 2 CAPSULES BY MOUTH 2 TIMES DAILY. 360 capsule 1  . ondansetron (ZOFRAN-ODT) 4 MG disintegrating tablet TAKE 1 TABLET BY MOUTH EVERY 8 HOURS AS NEEDED FOR NAUSEA AND VOMITING    . OVER THE COUNTER MEDICATION Take 1 capsule by mouth daily. 7 MUSHROOM BLEND    . Polyethyl Glycol-Propyl Glycol (SYSTANE OP) Place 1 drop into both eyes 2 (two) times daily as needed (dry eyes).     . rosuvastatin (CRESTOR) 10 MG tablet Take 1 tablet (10 mg total) by mouth daily. 90 tablet 3  . SUNSCREEN SPF30 EX     . vitamin B-12 (CYANOCOBALAMIN) 1000 MCG tablet Take 1,000 mcg by mouth daily.    . vitamin  C (ASCORBIC ACID) 500 MG tablet Take 500 mg by mouth daily.    . methylphenidate (RITALIN) 10 MG tablet Take 1 tablet (10 mg total) by mouth in the morning. 90 tablet 0  . traMADol (ULTRAM) 50 MG tablet Take 1 tablet (50 mg total) by mouth every 6 (six) hours as needed for moderate pain. 100 tablet 1  . colesevelam (WELCHOL) 625 MG tablet Take 2 tablets (1,250 mg total) by mouth 2 (two) times daily with a meal. 360 tablet 3   No facility-administered medications prior to visit.    ROS: Review of Systems  Constitutional:  Positive for fatigue. Negative for activity change, appetite change, chills and unexpected weight change.  HENT:  Negative for congestion, mouth sores and sinus pressure.   Eyes:  Negative for visual disturbance.  Respiratory:  Negative for cough and chest  tightness.   Gastrointestinal:  Negative for abdominal pain and nausea.  Genitourinary:  Negative for difficulty urinating, frequency and vaginal pain.  Musculoskeletal:  Positive for arthralgias and gait problem. Negative for back pain.  Skin:  Negative for pallor and rash.  Neurological:  Negative for dizziness, tremors, weakness, numbness and headaches.  Psychiatric/Behavioral:  Positive for decreased concentration and dysphoric mood. Negative for confusion, sleep disturbance and suicidal ideas. The patient is nervous/anxious.     Objective:  BP 132/82 (BP Location: Left Arm, Patient Position: Sitting, Cuff Size: Large)   Pulse 74   Temp 98.2 F (36.8 C) (Oral)   Ht 5\' 2"  (1.575 m)   Wt 160 lb (72.6 kg)   SpO2 99%   BMI 29.26 kg/m   BP Readings from Last 3 Encounters:  09/30/22 132/82  09/23/22 (!) 156/98  09/09/22 (!) 168/92    Wt Readings from Last 3 Encounters:  09/30/22 160 lb (72.6 kg)  09/23/22 159 lb 6.4 oz (72.3 kg)  09/09/22 160 lb (72.6 kg)    Physical Exam Constitutional:      General: She is not in acute distress.    Appearance: Normal appearance. She is well-developed.  HENT:     Head: Normocephalic.     Right Ear: External ear normal.     Left Ear: External ear normal.     Nose: Nose normal.  Eyes:     General:        Right eye: No discharge.        Left eye: No discharge.     Conjunctiva/sclera: Conjunctivae normal.     Pupils: Pupils are equal, round, and reactive to light.  Neck:     Thyroid: No thyromegaly.     Vascular: No JVD.     Trachea: No tracheal deviation.  Cardiovascular:     Rate and Rhythm: Normal rate and regular rhythm.     Heart sounds: Normal heart sounds.  Pulmonary:     Effort: No respiratory distress.     Breath sounds: No stridor. No wheezing.  Abdominal:     General: Bowel sounds are normal. There is no distension.     Palpations: Abdomen is soft. There is no mass.     Tenderness: There is no abdominal tenderness.  There is no guarding or rebound.  Musculoskeletal:        General: Tenderness present.     Cervical back: Normal range of motion and neck supple. No rigidity.  Lymphadenopathy:     Cervical: No cervical adenopathy.  Skin:    Findings: No erythema or rash.  Neurological:     Mental Status: She  is oriented to person, place, and time.     Cranial Nerves: No cranial nerve deficit.     Motor: No abnormal muscle tone.     Coordination: Coordination normal.     Gait: Gait abnormal.     Deep Tendon Reflexes: Reflexes normal.  Psychiatric:        Behavior: Behavior normal.        Thought Content: Thought content normal.        Judgment: Judgment normal.   R hip w/pain Using a cane   Lab Results  Component Value Date   WBC 7.6 03/19/2022   HGB 12.4 03/19/2022   HCT 36.7 03/19/2022   PLT 348 03/19/2022   GLUCOSE 83 08/16/2022   CHOL 277 (H) 09/23/2022   TRIG 276 (H) 09/23/2022   HDL 51 09/23/2022   LDLDIRECT 160.0 10/18/2018   LDLCALC 174 (H) 09/23/2022   ALT 25 08/16/2022   AST 30 08/16/2022   NA 136 08/16/2022   K 4.0 08/16/2022   CL 101 08/16/2022   CREATININE 1.39 (H) 08/16/2022   BUN 28 (H) 08/16/2022   CO2 28 08/16/2022   TSH 2.44 08/16/2022   INR 1.0 07/15/2020   HGBA1C 5.8 03/01/2022   MICROALBUR 0.8 12/16/2021    CT HEAD WO CONTRAST ( )  Result Date: 09/27/2022 CLINICAL DATA:  Headaches, chronic ataxia EXAM: CT HEAD WITHOUT CONTRAST TECHNIQUE: Contiguous axial images were obtained from the base of the skull through the vertex without intravenous contrast. RADIATION DOSE REDUCTION: This exam was performed according to the departmental dose-optimization program which includes automated exposure control, adjustment of the mA and/or kV according to patient size and/or use of iterative reconstruction technique. COMPARISON:  03/19/2022 FINDINGS: Brain: No acute intracranial findings are seen. Ventricles are not dilated. Cortical sulci are prominent. There are no signs of  bleeding within the cranium. There is no focal edema or mass effect. 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 Another possibility would be old lacunar infarct. There is no adjacent edema or mass effect. Vascular: Scattered arterial calcifications are seen. Skull: No acute findings are seen. Sinuses/Orbits: There is mild mucosal thickening in ethmoid and sphenoid sinuses. Other: None IMPRESSION: No acute intracranial findings are seen in noncontrast CT brain. Atrophy. Electronically Signed   By: Ernie Avena M.D.   On: 09/27/2022 17:19    Assessment & Plan:   Problem List Items Addressed This Visit     Hyperlipidemia - Primary    No angiographic evidence of CAD - cath 2019 Aortic athero on PET CT Dr Servando Salina started the pt on Crestor      Atherosclerosis of aorta (HCC)    No angiographic evidence of CAD - cath 2019 Aortic athero on PET CT Dr Servando Salina started the pt on Crestor      Falls frequently    Severe hip OA No relapse off Hydralazine      Hip pain, chronic, right    Severe hip OA Appt w/Dr Swinteck  - pending Tramadol prn      Relevant Medications   traMADol (ULTRAM) 50 MG tablet      Meds ordered this encounter  Medications  . methylphenidate (RITALIN) 10 MG tablet    Sig: Take 1 tablet (10 mg total) by mouth in the morning.    Dispense:  90 tablet    Refill:  0    A 3 months supply  - fill every 90 days  .  traMADol (ULTRAM) 50 MG tablet    Sig: Take 1 tablet (50 mg total) by mouth every 6 (six) hours as needed for moderate pain.    Dispense:  100 tablet    Refill:  1      Follow-up: Return in about 2 months (around 11/30/2022) for a follow-up visit.  Sonda Primes, MD

## 2022-09-30 NOTE — Assessment & Plan Note (Signed)
Severe hip OA No relapse off Hydralazine

## 2022-09-30 NOTE — Assessment & Plan Note (Signed)
No angiographic evidence of CAD - cath 2019 Aortic athero on PET CT Dr Servando Salina started the pt on Crestor

## 2022-09-30 NOTE — Assessment & Plan Note (Signed)
No angiographic evidence of CAD - cath 2019 Aortic athero on PET CT Dr Tobb started the pt on Crestor 

## 2022-09-30 NOTE — Assessment & Plan Note (Signed)
Severe hip OA Appt w/Dr Swinteck  - pending Tramadol prn

## 2022-10-04 ENCOUNTER — Ambulatory Visit (INDEPENDENT_AMBULATORY_CARE_PROVIDER_SITE_OTHER): Payer: 59 | Admitting: Psychology

## 2022-10-04 DIAGNOSIS — F4323 Adjustment disorder with mixed anxiety and depressed mood: Secondary | ICD-10-CM | POA: Diagnosis not present

## 2022-10-04 DIAGNOSIS — R309 Painful micturition, unspecified: Secondary | ICD-10-CM | POA: Diagnosis not present

## 2022-10-04 DIAGNOSIS — Z1231 Encounter for screening mammogram for malignant neoplasm of breast: Secondary | ICD-10-CM | POA: Diagnosis not present

## 2022-10-04 NOTE — Progress Notes (Signed)
[name] Time: 4:10p-5:00 p50 minutes   [name] is a 72 y.o. 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 (Status: maintained) -- No Description Entered  Diminished interest in or enjoyment of activities. (Status: maintained) -- No Description Entered  Lack of energy. (Status: maintained) -- No Description Entered  Sad affect, social withdrawal, anxiety, loss of interest in activities, and low energy. (Status: maintained) -- No Description Entered  Medication Status compliance  Safety unspecified If Suicidal or Homicidal State Action Taken: unspecified  Current Risk:  Medications Buspar (Dosage: 30mg  2X/day)  New Medication (Dosage: 250mg  2x/day)  New Medication (Dosage: 250mg  2x/day)  Prozac (Dosage: 40mg  daily)  Remeron (Dosage: 30mg  nightly)  xanax (Dosage: .5mg  2-3X/day)  Objectives Related Problem: Recognize, accept, and cope with feelings of depression. Description: Identify and replace thoughts and beliefs that support depression. Target Date: 2022-10-28 Frequency: Daily Modality: individual Progress: 75%  Related Problem: Recognize, accept, and cope with feelings of depression. Description: Verbalize an understanding of healthy and unhealthy emotions with the intent of increasing the use of healthy emotions to guide actions. Target Date: 2022-10-28 Frequency: Daily Modality: individual Progress: 75%  Related Problem: Recognize, accept, and cope with feelings of depression. Description: Increasingly verbalize hopeful and positive statements regarding self, others, and the future. Target Date: 2022-04-28 Frequency: Daily Modality:  individual Progress: 100%  Related Problem: Reduce fear, anxiety, and worry associated with the medical condition. Description: Share with significant others efforts to adapt successfully to the medical condition/diagnosis. Target Date: 2022-10-28 Frequency: Daily Modality: individual Progress: 85%  Related Problem: Reduce fear, anxiety, and worry associated with the medical condition. Description: Engage in social, productive, and recreational activities that are possible in spite of medical condition. Target Date: 2022-10-28 Frequency: Daily Modality: individual Progress: 90%  Related Problem: Reduce fear, anxiety, and worry associated with the medical condition. Description: Identify the coping skills and sources of emotional support that have been beneficial in the past. Target Date: 2022-10-28 Frequency: Daily Modality: individual Progress: 75%  Client Response full compliance  Service Location Location, [address] [place], [place] [address]  Service Code cpt [phone]  Related past to present  Facilitate problem solving  Identify/label emotions  Validate/empathize  Self care activities  Lifestyle change (exercise, nutrition)  Assess/facilitate readiness to change  Relaxation training  Rationally challenge thoughts or beliefs/cognitive restructuring  Comments  Dx: F43.21  Meds: Prozac, [name], patient is struggling to rebuild her life and adjust to her new circumstances. She suffers a negative self-concept and has little confidence in her judgement. She is seeking to reduce symptoms of anxiety and self-doubt and to re-define satisfa ction moving forward. Therapy will focus on cognitive restructuring to redefine her experiences and focus on those satisfying aspects of her life. Will target this goal completion for April1, 2021 (completed). She is also wanting to utilize counseling to adjust  and accept her  latest diagnosis of cancer. She wants assistance to better manage this news and remain positive throughout treatment. Goal date is 12-22. Patient has made progress, but her medical condition is variable and she requests additional treatment to manage moods throughout her medical ordeal. In addition, she is trying to manage feelings associated with her older son's mental health struggles. Revised goal date is 6-24.  Patient agrees to have a video session. She is at home and I am in home office.   [name] says that "things got worse" for a few days after we spoke, but she is feeling better now. She spoke with a friend about her distress about not knowing anything about her son, who told her that she understood what [name] was feeling. [name] decided to [employer] and found out he was arrested in Sept., 2023 for driving under the influence. She said this is the third DUI for him. She was relieved just to find out that he was alive. She is convinced that her son is in prison, but does not know for sure. Her son and Charlotte father do not want to talk with her at all about him.  She just returned from her annual mamogram and everything looked good except she has another UTI. She reports that knowing that [name] is okay has been helpful in feeling better. She is both thankful and sad.                                                                                                                                                              [name] Time: 4:15p-5:00p 45 minutes

## 2022-10-05 ENCOUNTER — Telehealth: Payer: Self-pay | Admitting: *Deleted

## 2022-10-05 NOTE — Telephone Encounter (Signed)
Pt need PA on Tramadol. [employer]s not able to locate patient. Will retry tomorrow.Marland KitchenRaechel Chute

## 2022-10-06 NOTE — Telephone Encounter (Signed)
Had to call # on card. Spoke w/ rep was given PA # for [facility] [id number]. Gave rep PA information on Tramadol. She states it will take up to 72 hours before decision will be made. Case # [id number].Marland Kitchen/LMB

## 2022-10-08 NOTE — Telephone Encounter (Signed)
Have not received anything back yet. PA still pending...[name]

## 2022-10-13 ENCOUNTER — Ambulatory Visit: Payer: Medicare Other | Admitting: Psychology

## 2022-10-18 ENCOUNTER — Ambulatory Visit: Payer: 59 | Admitting: Psychology

## 2022-10-19 ENCOUNTER — Telehealth: Payer: Self-pay | Admitting: Internal Medicine

## 2022-10-19 DIAGNOSIS — M1611 Unilateral primary osteoarthritis, right hip: Secondary | ICD-10-CM | POA: Diagnosis not present

## 2022-10-19 NOTE — Telephone Encounter (Signed)
Rec'd form place in MD purple folder for completion../lmb 

## 2022-10-19 NOTE — Telephone Encounter (Signed)
Patient dropped off document Surgical Clearance, to be filled out by provider. Patient requested to send it via Call Patient to pick up within 7-days. Document is located in providers tray at front office.Please advise at [facility] [phone]

## 2022-10-20 ENCOUNTER — Telehealth: Payer: Self-pay | Admitting: Internal Medicine

## 2022-10-20 NOTE — Telephone Encounter (Signed)
Place form in MD purple folder to complete../lmb 

## 2022-10-20 NOTE — Telephone Encounter (Signed)
Patient dropped off document Surgical Clearance, to be filled out by provider. Patient requested to send it via Fax within 7-days. Document is located in providers tray at front office.Please advise at Mobile [phone] (mobile)

## 2022-10-21 NOTE — Progress Notes (Signed)
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 Rfl: 3   omega-3 acid ethyl esters (LOVAZA) 1 g capsule, TAKE 2 CAPSULES BY MOUTH 2 TIMES DAILY., Disp: 360 capsule, Rfl: 1   ondansetron (ZOFRAN-ODT) 4 MG disintegrating tablet, TAKE 1 TABLET BY MOUTH EVERY 8 HOURS AS NEEDED FOR NAUSEA AND VOMITING, Disp: , Rfl:    OVER THE COUNTER MEDICATION, Take 1 capsule by mouth daily. 7 MUSHROOM BLEND, Disp: , Rfl:    Polyethyl Glycol-Propyl Glycol (SYSTANE OP), Place 1 drop into both eyes 2 (two) times daily as needed (dry eyes). , Disp: , Rfl:    rosuvastatin (CRESTOR) 10 MG tablet, Take 1 tablet (10 mg total) by mouth daily., Disp: 90 tablet, Rfl: 3   SUNSCREEN SPF30 EX, , Disp: , Rfl:    traMADol (ULTRAM) 50 MG tablet, Take 1 tablet (50 mg total) by mouth every 6 (six) hours as needed for moderate pain., Disp: 100 tablet, Rfl: 1   vitamin B-12 (CYANOCOBALAMIN) 1000 MCG tablet, Take 1,000 mcg by mouth daily., Disp: , Rfl:    vitamin C (ASCORBIC ACID) 500 MG tablet, Take 500 mg by mouth daily., Disp: , Rfl:   Review of Systems: + joint pain, anxiety, dysuria Denies appetite changes, fevers, chills, fatigue, unexplained weight  changes. Denies hearing loss, neck lumps or masses, mouth sores, ringing in ears or voice changes. Denies cough or wheezing.  Denies shortness of breath. Denies chest pain or palpitations. Denies leg swelling. Denies abdominal distention, pain, blood in stools, constipation, diarrhea, nausea, vomiting, or early satiety. Denies pain with intercourse, frequency, hematuria or incontinence. Denies hot flashes, pelvic pain, vaginal bleeding or vaginal discharge.   Denies back pain or muscle pain/cramps. Denies itching, rash, or wounds. Denies dizziness, headaches, numbness or seizures. Denies swollen lymph nodes or glands, denies easy bruising or bleeding. Denies depression, confusion, or decreased concentration.  Physical Exam: BP (!) 168/105 (BP Location: Left Arm, Patient Position: Sitting)   Pulse 71   Temp (!) 96.8 F (36 C) (Axillary)   Resp 18   Ht 5' 1.81" (1.57 m)   Wt 159 lb (72.1 kg)   SpO2 100%   BMI 29.26 kg/m  General: Alert, oriented, no acute distress. HEENT: Normocephalic, atraumatic, sclera anicteric. Chest: Clear to auscultation bilaterally.  No wheezes or rhonchi. Cardiovascular: Regular rate and rhythm, no murmurs. Abdomen: soft, nontender.  Normoactive bowel sounds.  No masses or hepatosplenomegaly appreciated.  Well-healed incisions. Extremities: Grossly normal range of motion.  Warm, well perfused.  No edema bilaterally. Skin: No rashes or lesions noted. Lymphatics: No cervical, supraclavicular, or inguinal adenopathy. GU: Normal appearing external genitalia without erythema, excoriation, or lesions.  Speculum exam reveals mildly atrophic vaginal mucosa, no bleeding or discharge, no masses.  Bimanual exam reveals cuff smooth, no nodularity or masses.  Rectovaginal exam confirms these findings.  Laboratory & Radiologic Studies: CT A/P on 3/10: 1. No acute intrathoracic, abdominal, or pelvic pathology. No evidence of metastatic disease in the chest, abdomen, or  pelvis. 2. A 4 mm nonobstructing left renal inferior pole calculus. No hydronephrosis. 3. Moderate colonic stool burden. No bowel obstruction. Normal appendix. 4.  Aortic Atherosclerosis (ICD10-I70.0).  Assessment & Plan: [name] is a 72 y.o. woman with a history of recurrent LMS (most recent oligometastatic recurrence resected with negative margins in 07/2020) who presents for follow-up.   Patient  is NED on exam today. Overall doing very well.  Given urinary symptoms, we will plan for UA and culture today.   I will plan to see her for follow-up in 3 months.  We will plan for repeat imaging 6 months after her recent CT.  I have asked her to call with any new or concerning symptoms before her next visit.  20 minutes of total time was spent for this patient encounter, including preparation, face-to-face counseling with the patient and coordination of care, and documentation of the encounter.  Eugene Garnet, MD  Division of Gynecologic Oncology  Department of Obstetrics and Gynecology  [facility]

## 2022-10-21 NOTE — Telephone Encounter (Signed)
Faxed surgical back to [facility] @ [phone]...Raechel Chute

## 2022-10-22 ENCOUNTER — Inpatient Hospital Stay: Payer: 59 | Attending: Gynecologic Oncology | Admitting: Gynecologic Oncology

## 2022-10-22 ENCOUNTER — Other Ambulatory Visit: Payer: Self-pay

## 2022-10-22 ENCOUNTER — Other Ambulatory Visit: Payer: Self-pay | Admitting: Internal Medicine

## 2022-10-22 ENCOUNTER — Encounter: Payer: Self-pay | Admitting: Gynecologic Oncology

## 2022-10-22 ENCOUNTER — Inpatient Hospital Stay: Payer: 59

## 2022-10-22 ENCOUNTER — Ambulatory Visit (INDEPENDENT_AMBULATORY_CARE_PROVIDER_SITE_OTHER): Payer: 59 | Admitting: Psychology

## 2022-10-22 VITALS — BP 168/105 | HR 71 | Temp 96.8°F | Resp 18 | Ht 61.81 in | Wt 159.0 lb

## 2022-10-22 DIAGNOSIS — F4323 Adjustment disorder with mixed anxiety and depressed mood: Secondary | ICD-10-CM

## 2022-10-22 DIAGNOSIS — Z8542 Personal history of malignant neoplasm of other parts of uterus: Secondary | ICD-10-CM | POA: Diagnosis present

## 2022-10-22 DIAGNOSIS — Z9221 Personal history of antineoplastic chemotherapy: Secondary | ICD-10-CM | POA: Diagnosis not present

## 2022-10-22 DIAGNOSIS — Z90722 Acquired absence of ovaries, bilateral: Secondary | ICD-10-CM | POA: Diagnosis not present

## 2022-10-22 DIAGNOSIS — R300 Dysuria: Secondary | ICD-10-CM | POA: Insufficient documentation

## 2022-10-22 DIAGNOSIS — Z923 Personal history of irradiation: Secondary | ICD-10-CM | POA: Diagnosis not present

## 2022-10-22 DIAGNOSIS — C55 Malignant neoplasm of uterus, part unspecified: Secondary | ICD-10-CM

## 2022-10-22 DIAGNOSIS — Z8589 Personal history of malignant neoplasm of other organs and systems: Secondary | ICD-10-CM | POA: Diagnosis not present

## 2022-10-22 DIAGNOSIS — Z90710 Acquired absence of both cervix and uterus: Secondary | ICD-10-CM | POA: Diagnosis not present

## 2022-10-22 LAB — URINALYSIS, COMPLETE (UACMP) WITH MICROSCOPIC
Bacteria, UA: NONE SEEN
Bilirubin Urine: NEGATIVE
Glucose, UA: NEGATIVE mg/dL
Hgb urine dipstick: NEGATIVE
Ketones, ur: NEGATIVE mg/dL
Leukocytes,Ua: NEGATIVE
Nitrite: NEGATIVE
Protein, ur: NEGATIVE mg/dL
Specific Gravity, Urine: 1.005 (ref 1.005–1.030)
pH: 5 (ref 5.0–8.0)

## 2022-10-22 NOTE — Progress Notes (Signed)
Filomena Pokorney LEWIS Time: 4:10p-5:00 p50 minutes   EVANEE SQUARE is a 72 y.o. 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 (Status: maintained) -- No Description Entered  Diminished interest in or enjoyment of activities. (Status: maintained) -- No Description Entered  Lack of energy. (Status: maintained) -- No Description Entered  Sad affect, social withdrawal, anxiety, loss of interest in activities, and low energy. (Status: maintained) -- No Description Entered  Medication Status compliance  Safety unspecified If Suicidal or Homicidal State Action Taken: unspecified  Current Risk:  Medications Buspar (Dosage: 30mg  2X/day)  New Medication (Dosage: 250mg  2x/day)  New Medication (Dosage: 250mg  2x/day)  Prozac (Dosage: 40mg  daily)  Remeron (Dosage: 30mg  nightly)  xanax (Dosage: .5mg  2-3X/day)  Objectives Related Problem: Recognize, accept, and cope with feelings of depression. Description: Identify and replace thoughts and beliefs that support depression. Target Date: 2022-10-28 Frequency: Daily Modality: individual Progress: 75%  Related Problem: Recognize, accept, and cope with feelings of depression. Description: Verbalize an understanding of healthy and unhealthy emotions with the intent of increasing the use of healthy emotions to guide actions. Target Date: 2022-10-28 Frequency: Daily Modality: individual Progress: 75%  Related Problem: Recognize, accept, and cope with feelings of depression. Description: Increasingly verbalize hopeful and positive statements regarding self, others, and the future. Target Date: 2022-04-28 Frequency:  Daily Modality: individual Progress: 100%  Related Problem: Reduce fear, anxiety, and worry associated with the medical condition. Description: Share with significant others efforts to adapt successfully to the medical condition/diagnosis. Target Date: 2022-10-28 Frequency: Daily Modality: individual Progress: 85%  Related Problem: Reduce fear, anxiety, and worry associated with the medical condition. Description: Engage in social, productive, and recreational activities that are possible in spite of medical condition. Target Date: 2022-10-28 Frequency: Daily Modality: individual Progress: 90%  Related Problem: Reduce fear, anxiety, and worry associated with the medical condition. Description: Identify the coping skills and sources of emotional support that have been beneficial in the past. Target Date: 2022-10-28 Frequency: Daily Modality: individual Progress: 75%  Client Response full compliance  Service Location Location, [address] [place], [place] [address]  Service Code cpt [phone]  Related past to present  Facilitate problem solving  Identify/label emotions  Validate/empathize  Self care activities  Lifestyle change (exercise, nutrition)  Assess/facilitate readiness to change  Relaxation training  Rationally challenge thoughts or beliefs/cognitive restructuring  Comments  Dx: F43.21  Meds: Prozac, [name], patient is struggling to rebuild her life and adjust to her new circumstances. She suffers a negative self-concept and has little confidence in her judgement. She is seeking to reduce symptoms of anxiety and self-doubt and to re-define satisfa ction moving forward. Therapy will focus on cognitive restructuring to redefine her experiences and focus on those satisfying aspects of her life. Will target this  goal completion for April1, 2021 (completed). She is also wanting to utilize counseling to adjust  and accept her latest diagnosis of cancer. She wants assistance to better manage this news and remain positive throughout treatment. Goal date is 12-22. Patient has made progress, but her medical condition is variable and she requests additional treatment to manage moods throughout her medical ordeal. In addition, she is trying to manage feelings associated with her older son's mental health struggles. Revised goal date is 6-24.  Patient agrees to have a video session. She is at home and I am in home office.   Sirenia Whitis is continuing to feel upset about the lack of information about her son [name]. We talked about her options and what would be the goal of seeking additional information.  She went to orthopedic surgeon and her hip is in terrible shape and needs surgery. She was hoping to not need surgery, but there is no choice. Suggested that she reconnect with [employer] [place] meetings to process her thoughts and determine a path. She agrees and will re-engage.                                                                                                                                                                   Yordan Martindale LEWIS Time: 10:40a-11:30a 50 minutes

## 2022-10-22 NOTE — Patient Instructions (Signed)
It was good to see you today.  I do not see or feel any evidence of cancer recurrence on your exam.  I will see you for follow-up in 3 months.  We will get a repeat scan around the time of that visit.  As always, if you develop any new and concerning symptoms before your next visit, please call to see me sooner.

## 2022-10-22 NOTE — Telephone Encounter (Signed)
It has been completed.  Thanks

## 2022-10-23 LAB — URINE CULTURE: Culture: NO GROWTH

## 2022-10-25 NOTE — Telephone Encounter (Signed)
Forms was faxed to [facility]...Raechel Chute

## 2022-10-27 ENCOUNTER — Ambulatory Visit (INDEPENDENT_AMBULATORY_CARE_PROVIDER_SITE_OTHER): Payer: 59 | Admitting: Psychology

## 2022-10-27 DIAGNOSIS — F4323 Adjustment disorder with mixed anxiety and depressed mood: Secondary | ICD-10-CM | POA: Diagnosis not present

## 2022-10-27 NOTE — Progress Notes (Signed)
[name] Time: 4:10p-5:00 p50 minutes   [name] is a 72 y.o. 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 (Status: maintained) -- No Description Entered  Diminished interest in or enjoyment of activities. (Status: maintained) -- No Description Entered  Lack of energy. (Status: maintained) -- No Description Entered  Sad affect, social withdrawal, anxiety, loss of interest in activities, and low energy. (Status: maintained) -- No Description Entered  Medication Status compliance  Safety unspecified If Suicidal or Homicidal State Action Taken: unspecified  Current Risk:  Medications Buspar (Dosage: 30mg  2X/day)  New Medication (Dosage: 250mg  2x/day)  New Medication (Dosage: 250mg  2x/day)  Prozac (Dosage: 40mg  daily)  Remeron (Dosage: 30mg  nightly)  xanax (Dosage: .5mg  2-3X/day)  Objectives Related Problem: Recognize, accept, and cope with feelings of depression. Description: Identify and replace thoughts and beliefs that support depression. Target Date: 2023-04-29 Frequency: Daily Modality: individual Progress: 75%  Related Problem: Recognize, accept, and cope with feelings of depression. Description: Verbalize an understanding of healthy and unhealthy emotions with the intent of increasing the use of healthy emotions to guide actions. Target Date: 2023-04-29 Frequency: Daily Modality: individual Progress: 85%  Related Problem: Recognize, accept, and cope with feelings of depression. Description: Increasingly verbalize hopeful and positive statements regarding self, others, and the future. Target  Date: 2022-04-28 Frequency: Daily Modality: individual Progress: 100%  Related Problem: Reduce fear, anxiety, and worry associated with the medical condition. Description: Share with significant others efforts to adapt successfully to the medical condition/diagnosis. Target Date: 2023-04-29 Frequency: Daily Modality: individual Progress: 85%  Related Problem: Reduce fear, anxiety, and worry associated with the medical condition. Description: Engage in social, productive, and recreational activities that are possible in spite of medical condition. Target Date: 2023-04-29 Frequency: Daily Modality: individual Progress: 90%  Related Problem: Reduce fear, anxiety, and worry associated with the medical condition. Description: Identify the coping skills and sources of emotional support that have been beneficial in the past. Target Date: 2023-04-29 Frequency: Daily Modality: individual Progress: 80%  Client Response full compliance  Service Location Location, [address] [place], [place] [address]  Service Code cpt [phone]  Related past to present  Facilitate problem solving  Identify/label emotions  Validate/empathize  Self care activities  Lifestyle change (exercise, nutrition)  Assess/facilitate readiness to change  Relaxation training  Rationally challenge thoughts or beliefs/cognitive restructuring  Comments  Dx: F43.21  Meds: Prozac, [name], patient is struggling to rebuild her life and adjust to her new circumstances. She suffers a negative self-concept and has little confidence in her judgement. She is seeking to reduce symptoms of anxiety and self-doubt and to re-define satisfa ction moving forward. Therapy will focus on cognitive restructuring to  redefine her experiences and focus on those satisfying aspects of her life. Will target this goal completion for April1, 2021 (completed). She is also wanting to  utilize counseling to adjust and accept her latest diagnosis of cancer. She wants assistance to better manage this news and remain positive throughout treatment. Goal date is 12-22. Patient has made progress, but her medical condition is variable and she requests additional treatment to manage moods throughout her medical ordeal. In addition, she is trying to manage feelings associated with her older son's mental health struggles. Revised goal date is 12-24.  Patient agrees to have a video session. She is at home and I am in home office.   [name] talked about her appointment with Dr. Donell Beers. He had previously increased her sleeping med (Remeron) and she told him it has been helping. She is generally feeling better and is doing more around her house. She has been tending to repeat some of her "stories". Gently commented about this, but does not recollect mentioning stories in previous sessions. She has hired an Geophysicist/field seismologist to help her get more done around the house. She has found this very helpful. She reflected on her life. While looking at pictures from her childhood, it occurs to her that she was depressed. She said she looked depressed in photos and she thinks it was related to some of her family's financial struggles. She knew thaey would survive, but things were tight financially. Parents could not afford for her to go to college, but they did send her brother. She felt less important.                                                                                                                                                                      [name] Time: 3:15p-4:00p 45 minutes

## 2022-10-29 ENCOUNTER — Ambulatory Visit: Payer: 59 | Attending: Cardiology | Admitting: Student

## 2022-10-29 ENCOUNTER — Encounter: Payer: Self-pay | Admitting: Student

## 2022-10-29 VITALS — BP 142/90 | HR 70

## 2022-10-29 DIAGNOSIS — I10 Essential (primary) hypertension: Secondary | ICD-10-CM

## 2022-10-29 MED ORDER — LOSARTAN POTASSIUM 50 MG PO TABS: 50.0000 mg | ORAL_TABLET | Freq: Every day | ORAL | 3 refills | Status: DC

## 2022-10-29 NOTE — Patient Instructions (Addendum)
Changes made by your pharmacist Carmela Hurt, PharmD at today's visit:    Instructions/Changes  (what do you need to do) Your Notes  (what you did and when you did it)  Losartan 50 mg daily and get lab done in 2 weeks     Continue taking HCTZ 25 mg daily, Coreg 6.25 mg twice daily     Bring all of your meds, your BP cuff and your record of home blood pressures to your next appointment.    HOW TO TAKE YOUR BLOOD PRESSURE AT HOME  Rest 5 minutes before taking your blood pressure.  Don't smoke or drink caffeinated beverages for at least 30 minutes before. Take your blood pressure before (not after) you eat. Sit comfortably with your back supported and both feet on the floor (don't cross your legs). Elevate your arm to heart level on a table or a desk. Use the proper sized cuff. It should fit smoothly and snugly around your bare upper arm. There should be enough room to slip a fingertip under the cuff. The bottom edge of the cuff should be 1 inch above the crease of the elbow. Ideally, take 3 measurements at one sitting and record the average.  Important lifestyle changes to control high blood pressure  Intervention  Effect on the BP  Lose extra pounds and watch your waistline Weight loss is one of the most effective lifestyle changes for controlling blood pressure. If you're overweight or obese, losing even a small amount of weight can help reduce blood pressure. Blood pressure might go down by about 1 millimeter of mercury (mm Hg) with each kilogram (about 2.2 pounds) of weight lost.  Exercise regularly As a general goal, aim for at least 30 minutes of moderate physical activity every day. Regular physical activity can lower high blood pressure by about 5 to 8 mm Hg.  Eat a healthy diet Eating a diet rich in whole grains, fruits, vegetables, and low-fat dairy products and low in saturated fat and cholesterol. A healthy diet can lower high blood pressure by up to 11 mm Hg.  Reduce salt  (sodium) in your diet Even a small reduction of sodium in the diet can improve heart health and reduce high blood pressure by about 5 to 6 mm Hg.  Limit alcohol One drink equals 12 ounces of beer, 5 ounces of wine, or 1.5 ounces of 80-proof liquor.  Limiting alcohol to less than one drink a day for women or two drinks a day for men can help lower blood pressure by about 4 mm Hg.   If you have any questions or concerns please use My Chart to send questions or call the office at [phone]
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
## 2022-10-29 NOTE — Assessment & Plan Note (Signed)
Assessment: BP is uncontrolled in office BP 149/96 heart rate 74 2nd measurement 142/90 heart rate 70 (goal <130/80) Home BP ~138/80-90 heart rate 70  Tolerates current BP medication well without any side effects  Denies SOB, palpitation, chest pain, headaches,or swelling Follows healthy low sodium plant based diet and exercise regularly Last CrCl 42 mL/min (08/2022) reasonable to add ACEi or ARB to current regimen  Patient would like to get baseline CMP instead of BMP as she started taking Crestor about a month ago wondering about her liver enzymes     Plan:  Start taking losartan 50 mg daily will check CMP today for baseline  Continue taking HCTZ 25 mg daily, Coreg 6.25 mg twice daily  Patient to keep record of BP readings with heart rate and report to [place] at the next visit Patient to see PharmD in 4 weeks for follow up  Follow up lab(s) : BMP in 2 weeks

## 2022-10-30 LAB — COMPREHENSIVE METABOLIC PANEL
ALT: 25 IU/L (ref 0–32)
AST: 31 IU/L (ref 0–40)
Albumin: 4.6 g/dL (ref 3.8–4.8)
Alkaline Phosphatase: 133 IU/L — ABNORMAL HIGH (ref 44–121)
BUN/Creatinine Ratio: 9 — ABNORMAL LOW (ref 12–28)
BUN: 11 mg/dL (ref 8–27)
Bilirubin Total: 0.2 mg/dL (ref 0.0–1.2)
CO2: 23 mmol/L (ref 20–29)
Calcium: 9.4 mg/dL (ref 8.7–10.3)
Chloride: 97 mmol/L (ref 96–106)
Creatinine, Ser: 1.2 mg/dL — ABNORMAL HIGH (ref 0.57–1.00)
Globulin, Total: 2.5 g/dL (ref 1.5–4.5)
Glucose: 84 mg/dL (ref 70–99)
Potassium: 4.7 mmol/L (ref 3.5–5.2)
Sodium: 135 mmol/L (ref 134–144)
Total Protein: 7.1 g/dL (ref 6.0–8.5)
eGFR: 48 mL/min/{1.73_m2} — ABNORMAL LOW (ref >59)

## 2022-10-31 ENCOUNTER — Other Ambulatory Visit: Payer: Self-pay | Admitting: Internal Medicine

## 2022-11-01 ENCOUNTER — Ambulatory Visit (INDEPENDENT_AMBULATORY_CARE_PROVIDER_SITE_OTHER): Payer: 59 | Admitting: Psychology

## 2022-11-01 DIAGNOSIS — F4323 Adjustment disorder with mixed anxiety and depressed mood: Secondary | ICD-10-CM
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
## 2022-11-03 ENCOUNTER — Telehealth: Payer: Self-pay | Admitting: *Deleted

## 2022-11-03 NOTE — Telephone Encounter (Signed)
   Pre-operative Risk Assessment    Patient Name: [name]  DOB: May 15, 1951 MRN: [id number]      Request for Surgical Clearance    Procedure:   RIGHT TOTAL HIP ARTHROPLASTY  Date of Surgery:  Clearance TBD                                 Surgeon:  DR. Samson Frederic Surgeon's Group or Practice Name:  [facility] Phone number:  [phone] Fax number:  [phone]   Type of Clearance Requested:   - Medical    Type of Anesthesia:  Spinal   Additional requests/questions:    Wilhemina Cash   11/03/2022, 12:57 PM

## 2022-11-03 NOTE — Telephone Encounter (Signed)
   Patient Name: [name]  DOB: December 29, 1950 MRN: [id number]  Primary Cardiologist: Thomasene Ripple, DO  Chart reviewed as part of pre-operative protocol coverage.  Spoke with patient regarding surgical clearance request.  Patient states that BP remains labile.  She would prefer to have her office visit with hypertension clinic Pharm.D. prior to moving forward with surgical clearance.  Visit is currently scheduled for 11/25/2022, however, patient is going to call office to see if she can change this appointment time.  Pending appointment details, she will likely require preop telephone visit versus office visit to evaluate BP prior to finalizing clearance.   Joylene Grapes, NP 11/03/2022, 1:06 PM

## 2022-11-06 ENCOUNTER — Other Ambulatory Visit: Payer: Self-pay | Admitting: Internal Medicine

## 2022-11-10 ENCOUNTER — Telehealth: Payer: Self-pay | Admitting: Cardiology

## 2022-11-10 ENCOUNTER — Ambulatory Visit (INDEPENDENT_AMBULATORY_CARE_PROVIDER_SITE_OTHER): Payer: 59 | Admitting: Psychology

## 2022-11-10 DIAGNOSIS — F4323 Adjustment disorder with mixed anxiety and depressed mood: Secondary | ICD-10-CM | POA: Diagnosis not present

## 2022-11-10 NOTE — Telephone Encounter (Signed)
BP 162/101 HR 57 Yesterday pt has taking double doses of her medication. She took: Losartan 50 mg am 50 mg couple hours later Coreg 6.25 mg 2 tablets twice daily Hydrochlorothiazide 25 mg twice yesterday. Tramadol for pain. "This has been going on for  Xanax 1 mg three times daily. "I do not feel stressed but I feel anxious because I do not understand what is making my pressure so high. I'm low sodium and no caffeine." Sat 15th: 11am 174/106 HR 62; 1pm 134/91 HR 89 Today: 8am 183/115 HR 60 Pt advised to go to the ED due to her blood pressure not improving, she is dizzy, had blurred vision. Pt states she will call an ambulance.

## 2022-11-10 NOTE — Telephone Encounter (Signed)
Pt c/o BP issue: STAT if pt c/o blurred vision, one-sided weakness or slurred speech  1. What are your last 5 BP readings? 183/115 HR 60; 188/123 HR 70; 171/98 HR 70 142/92 HR 73  2. Are you having any other symptoms (ex. Dizziness, headache, blurred vision, passed out)? Nausea diarrhea, headaches  3. What is your BP issue? Running high, only can get down when she takes extra meds.

## 2022-11-10 NOTE — Progress Notes (Signed)
[name] Time: 4:10p-5:00 p50 minutes   [name] is a 72 y.o. 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 (Status: maintained) -- No Description Entered  Diminished interest in or enjoyment of activities. (Status: maintained) -- No Description Entered  Lack of energy. (Status: maintained) -- No Description Entered  Sad affect, social withdrawal, anxiety, loss of interest in activities, and low energy. (Status: maintained) -- No Description Entered  Medication Status compliance  Safety unspecified If Suicidal or Homicidal State Action Taken: unspecified  Current Risk:  Medications Buspar (Dosage: 30mg  2X/day)  New Medication (Dosage: 250mg  2x/day)  New Medication (Dosage: 250mg  2x/day)  Prozac (Dosage: 40mg  daily)  Remeron (Dosage: 30mg  nightly)  xanax (Dosage: .5mg  2-3X/day)  Objectives Related Problem: Recognize, accept, and cope with feelings of depression. Description: Identify and replace thoughts and beliefs that support depression. Target Date: 2023-04-29 Frequency: Daily Modality: individual Progress: 75%  Related Problem: Recognize, accept, and cope with feelings of depression. Description: Verbalize an understanding of healthy and unhealthy emotions with the intent of increasing the use of healthy emotions to guide actions. Target Date: 2023-04-29 Frequency: Daily Modality: individual Progress: 85%  Related Problem: Recognize, accept, and cope with feelings of depression. Description: Increasingly verbalize hopeful and positive  statements regarding self, others, and the future. Target Date: 2022-04-28 Frequency: Daily Modality: individual Progress: 100%  Related Problem: Reduce fear, anxiety, and worry associated with the medical condition. Description: Share with significant others efforts to adapt successfully to the medical condition/diagnosis. Target Date: 2023-04-29 Frequency: Daily Modality: individual Progress: 85%  Related Problem: Reduce fear, anxiety, and worry associated with the medical condition. Description: Engage in social, productive, and recreational activities that are possible in spite of medical condition. Target Date: 2023-04-29 Frequency: Daily Modality: individual Progress: 90%  Related Problem: Reduce fear, anxiety, and worry associated with the medical condition. Description: Identify the coping skills and sources of emotional support that have been beneficial in the past. Target Date: 2023-04-29 Frequency: Daily Modality: individual Progress: 80%  Client Response full compliance  Service Location Location, [address] [place], [place] [address]  Service Code cpt [phone]  Related past to present  Facilitate problem solving  Identify/label emotions  Validate/empathize  Self care activities  Lifestyle change (exercise, nutrition)  Assess/facilitate readiness to change  Relaxation training  Rationally challenge thoughts or beliefs/cognitive restructuring  Comments  Dx: F43.21  Meds: Prozac, [name], patient is struggling to rebuild her life and adjust to her new circumstances. She suffers a negative self-concept and  has little confidence in her judgement. She is seeking to reduce symptoms of anxiety and self-doubt and to re-define satisfa ction moving forward. Therapy will focus on cognitive restructuring to redefine her experiences and focus on those satisfying aspects of her life. Will target this goal  completion for April1, 2021 (completed). She is also wanting to utilize counseling to adjust and accept her latest diagnosis of cancer. She wants assistance to better manage this news and remain positive throughout treatment. Goal date is 12-22. Patient has made progress, but her medical condition is variable and she requests additional treatment to manage moods throughout her medical ordeal. In addition, she is trying to manage feelings associated with her older son's mental health struggles. Revised goal date is 12-24.  Patient agrees to have a video Public affairs consultant) session and is aware of the limitations of this platform. She is at home and I am in office.   [name] says she is not well and told by nurse she needs to call an ambulance to go to ER. Her BP is 162/101. Cardiology wants her to be evaluated. She is very upset about having to spend time in the ER. She isn't feeling it is as urgent as the nurse at Cardiology office. [name] feels it is a CYA move for the Cardiology office rather than a reflection of a true emergency. Suggested that she follow through with the recommendation to go to hospital and not take any risks.                                                                                                                                                                            [name] Grant Time: 3:15p-4:00p 45 minutes

## 2022-11-10 NOTE — Addendum Note (Signed)
Addended by: Brunetta Genera on: 11/10/2022 10:26 AM   Modules accepted: Orders

## 2022-11-15 ENCOUNTER — Ambulatory Visit (INDEPENDENT_AMBULATORY_CARE_PROVIDER_SITE_OTHER): Payer: 59 | Admitting: Psychology

## 2022-11-15 ENCOUNTER — Institutional Professional Consult (permissible substitution): Payer: 59 | Admitting: Diagnostic Neuroimaging

## 2022-11-15 ENCOUNTER — Encounter: Payer: Self-pay | Admitting: Diagnostic Neuroimaging

## 2022-11-15 DIAGNOSIS — F4323 Adjustment disorder with mixed anxiety and depressed mood: Secondary | ICD-10-CM | POA: Diagnosis not present

## 2022-11-15 NOTE — Progress Notes (Signed)
[name] Time: 4:10p-5:00 p50 minutes   [name] is a 72 y.o. 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 (Status: maintained) -- No Description Entered  Diminished interest in or enjoyment of activities. (Status: maintained) -- No Description Entered  Lack of energy. (Status: maintained) -- No Description Entered  Sad affect, social withdrawal, anxiety, loss of interest in activities, and low energy. (Status: maintained) -- No Description Entered  Medication Status compliance  Safety unspecified If Suicidal or Homicidal State Action Taken: unspecified  Current Risk:  Medications Buspar (Dosage: 30mg  2X/day)  New Medication (Dosage: 250mg  2x/day)  New Medication (Dosage: 250mg  2x/day)  Prozac (Dosage: 40mg  daily)  Remeron (Dosage: 30mg  nightly)  xanax (Dosage: .5mg  2-3X/day)  Objectives Related Problem: Recognize, accept, and cope with feelings of depression. Description: Identify and replace thoughts and beliefs that support depression. Target Date: 2023-04-29 Frequency: Daily Modality: individual Progress: 75%  Related Problem: Recognize, accept, and cope with feelings of depression. Description: Verbalize an understanding of healthy and unhealthy emotions with the intent of increasing the use of healthy emotions to guide actions. Target Date: 2023-04-29 Frequency: Daily Modality: individual Progress: 85%  Related Problem: Recognize, accept, and cope with feelings of depression. Description: Increasingly  verbalize hopeful and positive statements regarding self, others, and the future. Target Date: 2022-04-28 Frequency: Daily Modality: individual Progress: 100%  Related Problem: Reduce fear, anxiety, and worry associated with the medical condition. Description: Share with significant others efforts to adapt successfully to the medical condition/diagnosis. Target Date: 2023-04-29 Frequency: Daily Modality: individual Progress: 85%  Related Problem: Reduce fear, anxiety, and worry associated with the medical condition. Description: Engage in social, productive, and recreational activities that are possible in spite of medical condition. Target Date: 2023-04-29 Frequency: Daily Modality: individual Progress: 90%  Related Problem: Reduce fear, anxiety, and worry associated with the medical condition. Description: Identify the coping skills and sources of emotional support that have been beneficial in the past. Target Date: 2023-04-29 Frequency: Daily Modality: individual Progress: 80%  Client Response full compliance  Service Location Location, [address] [place], [place] [address]  Service Code cpt [phone]  Related past to present  Facilitate problem solving  Identify/label emotions  Validate/empathize  Self care activities  Lifestyle change (exercise, nutrition)  Assess/facilitate readiness to change  Relaxation training  Rationally challenge thoughts or beliefs/cognitive restructuring  Comments  Dx: F43.21  Meds: Prozac, [name], patient is struggling to  rebuild her life and adjust to her new circumstances. She suffers a negative self-concept and has little confidence in her judgement. She is seeking to reduce symptoms of anxiety and self-doubt and to re-define satisfa ction moving forward. Therapy will focus on cognitive restructuring to redefine her experiences and focus on those satisfying aspects of her  life. Will target this goal completion for April1, 2021 (completed). She is also wanting to utilize counseling to adjust and accept her latest diagnosis of cancer. She wants assistance to better manage this news and remain positive throughout treatment. Goal date is 12-22. Patient has made progress, but her medical condition is variable and she requests additional treatment to manage moods throughout her medical ordeal. In addition, she is trying to manage feelings associated with her older son's mental health struggles. Revised goal date is 12-24.  Patient agrees to have a video Public affairs consultant) session and is aware of the limitations of this platform. She is at home and I am in office.   [name] says she did not end up going to the hospital after our last session. Says she went to church and was hoping it would make her feel better and more like participating. It has been a while since she attended. She reports that her drive/motivation has been depleted. Says she feels blessed, but she never thought she would be in a situation where she had not contact with one son and very limited contact with her other son. We talked about her need to find focus and purpose, so she does not feel she is only working to take care of her own needs.                                                                                                                                                                              [name] Time: 4:15p-5:00p 45 minutes

## 2022-11-16 NOTE — Telephone Encounter (Signed)
Called pt she states she spoke with pharmacist from [facility], who states increasing Losartan should be safe. Losartan 50 mg twice daily. Coreg 6.25 mg twice daily. Hydrochlorothiazide 25 mg once daily. Pt states her blood pressure has improved but it's not the best. Her most recent readings:  150/90 HR 80  159/98 HR 69 149/104 HR 73 162/100 HR 67 170/94 HR 75 138/81 HR 71  Pt only c/o was a headache at night. Her headache has improved since increasing Losartan to 50 mg twice daily.  Pt wants to try increasing Coreg to 12.5 mg twice daily to help decrease the readings further.  Please advise. Pt will see pharmacy 11/25/22 at 1:30pm.

## 2022-11-16 NOTE — Telephone Encounter (Signed)
Pt is already prescribed Coreg 6.25 mg twice daily. Would you like yo increase the dosage? She has an appt with Pharm-D 7/11.

## 2022-11-16 NOTE — Telephone Encounter (Signed)
Called pt to relay message. She is hopeful to try the increased dose of Coreg. No further questions/concerns at this time.

## 2022-11-20 ENCOUNTER — Emergency Department (HOSPITAL_COMMUNITY): Payer: 59

## 2022-11-20 ENCOUNTER — Encounter (HOSPITAL_COMMUNITY): Payer: Self-pay | Admitting: Emergency Medicine

## 2022-11-20 ENCOUNTER — Inpatient Hospital Stay (HOSPITAL_COMMUNITY)
Admission: EM | Admit: 2022-11-20 | Discharge: 2022-11-22 | DRG: 641 | Disposition: A | Discharge status: Home / Self Care | Payer: 59 | Attending: Internal Medicine | Admitting: Internal Medicine

## 2022-11-20 ENCOUNTER — Other Ambulatory Visit: Payer: Self-pay

## 2022-11-20 DIAGNOSIS — R519 Headache, unspecified: Secondary | ICD-10-CM | POA: Diagnosis not present

## 2022-11-20 DIAGNOSIS — N1831 Chronic kidney disease, stage 3a: Secondary | ICD-10-CM | POA: Diagnosis not present

## 2022-11-20 DIAGNOSIS — F411 Generalized anxiety disorder: Secondary | ICD-10-CM | POA: Diagnosis present

## 2022-11-20 DIAGNOSIS — Z8544 Personal history of malignant neoplasm of other female genital organs: Secondary | ICD-10-CM

## 2022-11-20 DIAGNOSIS — E669 Obesity, unspecified: Secondary | ICD-10-CM | POA: Diagnosis present

## 2022-11-20 DIAGNOSIS — K219 Gastro-esophageal reflux disease without esophagitis: Secondary | ICD-10-CM | POA: Diagnosis present

## 2022-11-20 DIAGNOSIS — Z885 Allergy status to narcotic agent status: Secondary | ICD-10-CM

## 2022-11-20 DIAGNOSIS — Z90710 Acquired absence of both cervix and uterus: Secondary | ICD-10-CM

## 2022-11-20 DIAGNOSIS — Z9221 Personal history of antineoplastic chemotherapy: Secondary | ICD-10-CM

## 2022-11-20 DIAGNOSIS — T502X5A Adverse effect of carbonic-anhydrase inhibitors, benzothiadiazides and other diuretics, initial encounter: Secondary | ICD-10-CM | POA: Diagnosis present

## 2022-11-20 DIAGNOSIS — Z888 Allergy status to other drugs, medicaments and biological substances status: Secondary | ICD-10-CM | POA: Diagnosis not present

## 2022-11-20 DIAGNOSIS — E871 Hypo-osmolality and hyponatremia: Principal | ICD-10-CM | POA: Diagnosis present

## 2022-11-20 DIAGNOSIS — R6889 Other general symptoms and signs: Secondary | ICD-10-CM | POA: Diagnosis not present

## 2022-11-20 DIAGNOSIS — Z882 Allergy status to sulfonamides status: Secondary | ICD-10-CM | POA: Diagnosis not present

## 2022-11-20 DIAGNOSIS — R918 Other nonspecific abnormal finding of lung field: Secondary | ICD-10-CM | POA: Diagnosis not present

## 2022-11-20 DIAGNOSIS — E039 Hypothyroidism, unspecified: Secondary | ICD-10-CM | POA: Diagnosis not present

## 2022-11-20 DIAGNOSIS — I129 Hypertensive chronic kidney disease with stage 1 through stage 4 chronic kidney disease, or unspecified chronic kidney disease: Secondary | ICD-10-CM | POA: Diagnosis present

## 2022-11-20 DIAGNOSIS — Z881 Allergy status to other antibiotic agents status: Secondary | ICD-10-CM | POA: Diagnosis not present

## 2022-11-20 DIAGNOSIS — Z961 Presence of intraocular lens: Secondary | ICD-10-CM | POA: Diagnosis not present

## 2022-11-20 DIAGNOSIS — M797 Fibromyalgia: Secondary | ICD-10-CM | POA: Diagnosis present

## 2022-11-20 DIAGNOSIS — Z90722 Acquired absence of ovaries, bilateral: Secondary | ICD-10-CM

## 2022-11-20 DIAGNOSIS — Z743 Need for continuous supervision: Secondary | ICD-10-CM | POA: Diagnosis not present

## 2022-11-20 DIAGNOSIS — Z86718 Personal history of other venous thrombosis and embolism: Secondary | ICD-10-CM | POA: Diagnosis not present

## 2022-11-20 DIAGNOSIS — E785 Hyperlipidemia, unspecified: Secondary | ICD-10-CM | POA: Diagnosis not present

## 2022-11-20 DIAGNOSIS — Z8249 Family history of ischemic heart disease and other diseases of the circulatory system: Secondary | ICD-10-CM

## 2022-11-20 DIAGNOSIS — Z87820 Personal history of traumatic brain injury: Secondary | ICD-10-CM

## 2022-11-20 DIAGNOSIS — Z981 Arthrodesis status: Secondary | ICD-10-CM | POA: Diagnosis not present

## 2022-11-20 DIAGNOSIS — Z8261 Family history of arthritis: Secondary | ICD-10-CM

## 2022-11-20 DIAGNOSIS — Z79899 Other long term (current) drug therapy: Secondary | ICD-10-CM

## 2022-11-20 DIAGNOSIS — G4489 Other headache syndrome: Secondary | ICD-10-CM | POA: Diagnosis not present

## 2022-11-20 DIAGNOSIS — Z87442 Personal history of urinary calculi: Secondary | ICD-10-CM | POA: Diagnosis not present

## 2022-11-20 DIAGNOSIS — Z9079 Acquired absence of other genital organ(s): Secondary | ICD-10-CM

## 2022-11-20 DIAGNOSIS — I10 Essential (primary) hypertension: Secondary | ICD-10-CM | POA: Diagnosis present

## 2022-11-20 DIAGNOSIS — R079 Chest pain, unspecified: Secondary | ICD-10-CM | POA: Diagnosis not present

## 2022-11-20 DIAGNOSIS — M8580 Other specified disorders of bone density and structure, unspecified site: Secondary | ICD-10-CM | POA: Diagnosis present

## 2022-11-20 DIAGNOSIS — J4530 Mild persistent asthma, uncomplicated: Secondary | ICD-10-CM | POA: Diagnosis not present

## 2022-11-20 DIAGNOSIS — Z9841 Cataract extraction status, right eye: Secondary | ICD-10-CM

## 2022-11-20 DIAGNOSIS — Z9842 Cataract extraction status, left eye: Secondary | ICD-10-CM

## 2022-11-20 DIAGNOSIS — Y92009 Unspecified place in unspecified non-institutional (private) residence as the place of occurrence of the external cause: Secondary | ICD-10-CM

## 2022-11-20 DIAGNOSIS — Z833 Family history of diabetes mellitus: Secondary | ICD-10-CM

## 2022-11-20 DIAGNOSIS — Z9049 Acquired absence of other specified parts of digestive tract: Secondary | ICD-10-CM

## 2022-11-20 DIAGNOSIS — Z79890 Hormone replacement therapy (postmenopausal): Secondary | ICD-10-CM

## 2022-11-20 DIAGNOSIS — Z8542 Personal history of malignant neoplasm of other parts of uterus: Secondary | ICD-10-CM

## 2022-11-20 DIAGNOSIS — Z7982 Long term (current) use of aspirin: Secondary | ICD-10-CM

## 2022-11-20 DIAGNOSIS — F4323 Adjustment disorder with mixed anxiety and depressed mood: Secondary | ICD-10-CM | POA: Diagnosis present

## 2022-11-20 DIAGNOSIS — Z808 Family history of malignant neoplasm of other organs or systems: Secondary | ICD-10-CM

## 2022-11-20 DIAGNOSIS — N1832 Chronic kidney disease, stage 3b: Secondary | ICD-10-CM | POA: Insufficient documentation

## 2022-11-20 DIAGNOSIS — Z923 Personal history of irradiation: Secondary | ICD-10-CM

## 2022-11-20 DIAGNOSIS — Z6830 Body mass index (BMI) 30.0-30.9, adult: Secondary | ICD-10-CM

## 2022-11-20 LAB — COMPREHENSIVE METABOLIC PANEL
ALT: 18 U/L (ref 0–44)
AST: 23 U/L (ref 15–41)
Albumin: 3.9 g/dL (ref 3.5–5.0)
Alkaline Phosphatase: 105 U/L (ref 38–126)
Anion gap: 12 (ref 5–15)
BUN: 16 mg/dL (ref 8–23)
CO2: 19 mmol/L — ABNORMAL LOW (ref 22–32)
Calcium: 8.9 mg/dL (ref 8.9–10.3)
Chloride: 91 mmol/L — ABNORMAL LOW (ref 98–111)
Creatinine, Ser: 1.09 mg/dL — ABNORMAL HIGH (ref 0.44–1.00)
GFR, Estimated: 54 mL/min — ABNORMAL LOW (ref >60)
Glucose, Bld: 99 mg/dL (ref 70–99)
Potassium: 3.8 mmol/L (ref 3.5–5.1)
Sodium: 122 mmol/L — ABNORMAL LOW (ref 135–145)
Total Bilirubin: 0.7 mg/dL (ref 0.3–1.2)
Total Protein: 6.9 g/dL (ref 6.5–8.1)

## 2022-11-20 LAB — CBC WITH DIFFERENTIAL/PLATELET
Abs Immature Granulocytes: 0.05 10*3/uL (ref 0.00–0.07)
Basophils Absolute: 0.1 10*3/uL (ref 0.0–0.1)
Basophils Relative: 1 %
Eosinophils Absolute: 0.2 10*3/uL (ref 0.0–0.5)
Eosinophils Relative: 3 %
HCT: 34.1 % — ABNORMAL LOW (ref 36.0–46.0)
Hemoglobin: 12.1 g/dL (ref 12.0–15.0)
Immature Granulocytes: 1 %
Lymphocytes Relative: 24 %
Lymphs Abs: 2 10*3/uL (ref 0.7–4.0)
MCH: 30.8 pg (ref 26.0–34.0)
MCHC: 35.5 g/dL (ref 30.0–36.0)
MCV: 86.8 fL (ref 80.0–100.0)
Monocytes Absolute: 0.6 10*3/uL (ref 0.1–1.0)
Monocytes Relative: 7 %
Neutro Abs: 5.3 10*3/uL (ref 1.7–7.7)
Neutrophils Relative %: 64 %
Platelets: 339 10*3/uL (ref 150–400)
RBC: 3.93 MIL/uL (ref 3.87–5.11)
RDW: 12.7 % (ref 11.5–15.5)
WBC: 8.2 10*3/uL (ref 4.0–10.5)
nRBC: 0 % (ref 0.0–0.2)

## 2022-11-20 LAB — OSMOLALITY: Osmolality: 270 mOsm/kg — ABNORMAL LOW (ref 275–295)

## 2022-11-20 LAB — SODIUM, URINE, RANDOM: Sodium, Ur: 44 mmol/L

## 2022-11-20 LAB — OSMOLALITY, URINE: Osmolality, Ur: 187 mOsm/kg — ABNORMAL LOW (ref 300–900)

## 2022-11-20 MED ORDER — ONDANSETRON HCL 4 MG PO TABS: 4.0000 mg | ORAL_TABLET | Freq: Four times a day (QID) | ORAL | Status: DC | PRN

## 2022-11-20 MED ORDER — ROSUVASTATIN CALCIUM 5 MG PO TABS
10.0000 mg | ORAL_TABLET | Freq: Every day | ORAL | Status: DC
  Administered 2022-11-21 – 2022-11-22 (×2): 10 mg via ORAL
  Filled 2022-11-20 (×2): qty 2

## 2022-11-20 MED ORDER — HYDRALAZINE HCL 20 MG/ML IJ SOLN: 10.0000 mg | INTRAMUSCULAR | Status: DC | PRN

## 2022-11-20 MED ORDER — CARVEDILOL 12.5 MG PO TABS
12.5000 mg | ORAL_TABLET | Freq: Two times a day (BID) | ORAL | Status: DC
  Administered 2022-11-20 – 2022-11-22 (×4): 12.5 mg via ORAL
  Filled 2022-11-20 (×4): qty 1

## 2022-11-20 MED ORDER — ONDANSETRON HCL 4 MG/2ML IJ SOLN: 4.0000 mg | Freq: Four times a day (QID) | INTRAMUSCULAR | Status: DC | PRN

## 2022-11-20 MED ORDER — LEVOTHYROXINE SODIUM 100 MCG PO TABS
100.0000 ug | ORAL_TABLET | Freq: Every day | ORAL | Status: DC
  Administered 2022-11-21 – 2022-11-22 (×2): 100 ug via ORAL
  Filled 2022-11-20 (×2): qty 1

## 2022-11-20 MED ORDER — ACETAMINOPHEN 650 MG RE SUPP: 650.0000 mg | Freq: Four times a day (QID) | RECTAL | Status: DC | PRN

## 2022-11-20 MED ORDER — ACETAMINOPHEN 325 MG PO TABS
650.0000 mg | ORAL_TABLET | Freq: Four times a day (QID) | ORAL | Status: DC | PRN
  Administered 2022-11-20 – 2022-11-21 (×2): 650 mg via ORAL
  Filled 2022-11-20 (×2): qty 2

## 2022-11-20 MED ORDER — ALPRAZOLAM 0.5 MG PO TABS
1.0000 mg | ORAL_TABLET | Freq: Three times a day (TID) | ORAL | Status: DC | PRN
  Administered 2022-11-20 – 2022-11-22 (×4): 1 mg via ORAL
  Filled 2022-11-20 (×4): qty 2

## 2022-11-20 MED ORDER — SENNOSIDES-DOCUSATE SODIUM 8.6-50 MG PO TABS: 1.0000 | ORAL_TABLET | Freq: Every evening | ORAL | Status: DC | PRN

## 2022-11-20 MED ORDER — TRAMADOL HCL 50 MG PO TABS
50.0000 mg | ORAL_TABLET | Freq: Three times a day (TID) | ORAL | Status: DC | PRN
  Administered 2022-11-20 – 2022-11-21 (×2): 50 mg via ORAL
  Filled 2022-11-20 (×2): qty 1

## 2022-11-20 MED ORDER — BUSPIRONE HCL 10 MG PO TABS
30.0000 mg | ORAL_TABLET | Freq: Two times a day (BID) | ORAL | Status: DC
  Administered 2022-11-20 – 2022-11-22 (×4): 30 mg via ORAL
  Filled 2022-11-20 (×4): qty 3

## 2022-11-20 MED ORDER — ENOXAPARIN SODIUM 40 MG/0.4ML IJ SOSY
40.0000 mg | PREFILLED_SYRINGE | INTRAMUSCULAR | Status: DC
  Administered 2022-11-20 – 2022-11-21 (×2): 40 mg via SUBCUTANEOUS
  Filled 2022-11-20 (×2): qty 0.4

## 2022-11-20 MED ORDER — SODIUM CHLORIDE 0.9 % IV SOLN: INTRAVENOUS | Status: AC

## 2022-11-20 NOTE — Hospital Course (Signed)
[name] is a 72 y.o. 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

## 2022-11-20 NOTE — ED Provider Notes (Signed)
EMERGENCY DEPARTMENT AT [facility] Provider Note   CSN: [id number] Arrival date & time: 11/20/22  1643     History  Chief Complaint  Patient presents with   Hypertension    [name] is a 72 y.o. female with a history of uterine cancer, hypertension, CKD stage III, and anxiety who presents to the ED today with hypertension and headache.  Patient reports that for the past week her blood pressure readings have been elevated into the 190s/100s.  She spoke with her cardiologist and increased some of her blood pressure medications, but is unsure of which one specifically.  Patient reports today that she took her blood pressure prior to taking her medications and the reading was in the 200s systolically.  After that she took her medications and then repeated taking her blood pressures on both arms and wrists every hour.  She called EMS this afternoon when the blood pressures remained elevated.  Patient reports that she gets headaches when her blood pressure is high but denies chest pain, shortness of breath, or vision changes.  Patient reports that she had a headache and was nauseous earlier today but her symptoms have improved by the time I evaluated her.  She says that she took both tramadol and Xanax in the waiting room which helped with her symptoms.  No recent illness, weakness, or confusion.  No other complaints or concerns at this time.   Home Medications Prior to Admission medications   Medication Sig Start Date End Date Taking? Authorizing Provider  acetaminophen (TYLENOL) 325 MG tablet Take 2 tablets (650 mg total) by mouth every 6 (six) hours as needed for moderate pain. 07/14/21   Plotnikov, Georgina Quint, MD  ALPRAZolam Prudy Feeler) 1 MG tablet Take 1 tablet (1 mg total) by mouth 3 (three) times daily as needed for anxiety. 07/06/22   Plotnikov, Georgina Quint, MD  aspirin EC 81 MG tablet Take 1 tablet (81 mg total) by mouth daily. 07/14/21   Plotnikov, Georgina Quint, MD   busPIRone (BUSPAR) 30 MG tablet TAKE 1 TABLET BY MOUTH TWICE  DAILY 06/10/22   Plotnikov, Georgina Quint, MD  carvedilol (COREG) 6.25 MG tablet Take 1 tablet (6.25 mg total) by mouth 2 (two) times daily. 09/23/22   Tobb, Kardie, DO  Cholecalciferol (D3 5000) 125 MCG (5000 UT) capsule Take 5,000 Units by mouth daily.    [provider]  ciprofloxacin (CIPRO) 500 MG tablet Take 1 tablet (500 mg total) by mouth 2 (two) times daily. for 10 days 06/15/22   Plotnikov, Georgina Quint, MD  colesevelam [facility]) 625 MG tablet Take 2 tablets (1,250 mg total) by mouth 2 (two) times daily with a meal. 09/27/21 09/27/22  Plotnikov, Georgina Quint, MD  D-Mannose 500 MG CAPS Take 500 mg by mouth 2 (two) times daily. WITH CRANBERRY & DANDELION EXTRACT 09/27/21   Plotnikov, Georgina Quint, MD  diphenoxylate-atropine (LOMOTIL) 2.5-0.025 MG tablet TAKE 1 OR 2 TABLETS BY MOUTH 4 TIMES A DAY AS NEEDED FOR DIARRHEA/LOSSE STOOLS 08/02/22   Plotnikov, Georgina Quint, MD  esomeprazole (NEXIUM) 40 MG capsule TAKE 1 CAPSULE (40 MG TOTAL) BY MOUTH EVERY MORNING. 11/08/22   Plotnikov, Georgina Quint, MD  FLUoxetine (PROZAC) 40 MG capsule TAKE 1 CAPSULE BY MOUTH IN THE  MORNING 03/15/22   Plotnikov, Georgina Quint, MD  hydrochlorothiazide (HYDRODIURIL) 25 MG tablet Take 1 tablet (25 mg total) by mouth daily. 09/23/22 12/22/22  Tobb, Kardie, DO  ipratropium-albuterol (DUONEB) 0.5-2.5 (3) MG/3ML SOLN Take 3 mLs  by nebulization every 6 (six) hours as needed (Asthma).    [provider]  iron polysaccharides (NIFEREX) 150 MG capsule Take 1 capsule (150 mg total) by mouth daily. 11/02/22   Plotnikov, Georgina Quint, MD  levothyroxine (SYNTHROID) 100 MCG tablet TAKE 1 TABLET BY MOUTH DAILY BEFORE BREAKFAST. 10/25/22   Plotnikov, Georgina Quint, MD  loperamide (IMODIUM A-D) 2 MG tablet Take 2 mg by mouth 4 (four) times daily as needed for diarrhea or loose stools.    [provider]  losartan (COZAAR) 50 MG tablet Take 1 tablet (50 mg total) by mouth daily. 10/29/22  01/27/23  Tobb, Kardie, DO  METAMUCIL FIBER PO Take 1 capsule by mouth in the morning and at bedtime.    [provider]  methylphenidate (RITALIN) 10 MG tablet Take 1 tablet (10 mg total) by mouth in the morning. 09/30/22   Plotnikov, Georgina Quint, MD  methylPREDNISolone (MEDROL DOSEPAK) 4 MG TBPK tablet As directed 09/17/22   Plotnikov, Georgina Quint, MD  mirtazapine (REMERON) 30 MG tablet TAKE 1 TABLET BY MOUTH AT  BEDTIME 03/22/22   Plotnikov, Georgina Quint, MD  omega-3 acid ethyl esters (LOVAZA) 1 g capsule TAKE 2 CAPSULES BY MOUTH 2 TIMES DAILY. 03/18/22   Plotnikov, Georgina Quint, MD  ondansetron (ZOFRAN-ODT) 4 MG disintegrating tablet TAKE 1 TABLET BY MOUTH EVERY 8 HOURS AS NEEDED FOR NAUSEA AND VOMITING    [provider]  OVER THE COUNTER MEDICATION Take 1 capsule by mouth daily. 7 MUSHROOM BLEND    [provider]  Polyethyl Glycol-Propyl Glycol (SYSTANE OP) Place 1 drop into both eyes 2 (two) times daily as needed (dry eyes).     [provider]  rosuvastatin (CRESTOR) 10 MG tablet Take 1 tablet (10 mg total) by mouth daily. 09/24/22 12/23/22  Thomasene Ripple, DO  SUNSCREEN SPF30 EX     [provider]  traMADol (ULTRAM) 50 MG tablet Take 1 tablet (50 mg total) by mouth every 8 (eight) hours as needed for moderate pain. 11/02/22   Plotnikov, Georgina Quint, MD  vitamin B-12 (CYANOCOBALAMIN) 1000 MCG tablet Take 1,000 mcg by mouth daily.    [provider]  vitamin C (ASCORBIC ACID) 500 MG tablet Take 500 mg by mouth daily.    [provider]  famotidine (PEPCID) 40 MG tablet TAKE 1 TABLET BY MOUTH EVERY DAY IN THE EVENING Patient not taking: No sig reported 04/03/19 11/05/19  Kozlow, Alvira Philips, MD      Allergies    Buprenorphine hcl, Morphine and codeine, Amlodipine, Buprenorphine, Cephalexin, Other, Sumatriptan, Codeine, Dust mite extract, and Molds & smuts    Review of Systems   Review of Systems  Cardiovascular:        High blood pressure   Neurological:  Positive for headaches.  All other systems reviewed and are negative.   Physical Exam Updated Vital Signs BP (!) 136/117 (BP Location: Left Arm)   Pulse 64   Temp 98.8 F (37.1 C) (Oral)   Resp 18   SpO2 99%  Physical Exam Vitals and nursing note reviewed.  Constitutional:      Appearance: Normal appearance.  HENT:     Head: Normocephalic and atraumatic.     Nose: Nose normal.     Mouth/Throat:     Mouth: Mucous membranes are moist.  Eyes:     Conjunctiva/sclera: Conjunctivae normal.     Pupils: Pupils are equal, round, and reactive to light.  Cardiovascular:     Rate and Rhythm:  Normal rate and regular rhythm.     Pulses: Normal pulses.     Heart sounds: Normal heart sounds.  Pulmonary:     Effort: Pulmonary effort is normal.     Breath sounds: Normal breath sounds.  Abdominal:     Palpations: Abdomen is soft.     Tenderness: There is no abdominal tenderness.  Musculoskeletal:        General: Normal range of motion.     Cervical back: Normal range of motion.     Right lower leg: No edema.     Left lower leg: No edema.  Skin:    General: Skin is warm and dry.     Capillary Refill: Capillary refill takes less than 2 seconds.     Findings: No rash.  Neurological:     General: No focal deficit present.     Mental Status: She is alert.     Sensory: No sensory deficit.     Motor: No weakness.  Psychiatric:        Mood and Affect: Mood normal.        Behavior: Behavior normal.     ED Results / Procedures / Treatments   Labs (all labs ordered are listed, but only abnormal results are displayed) Labs Reviewed  CBC WITH DIFFERENTIAL/PLATELET - Abnormal; Notable for the following components:      Result Value   HCT 34.1 (*)    All other components within normal limits  COMPREHENSIVE METABOLIC PANEL - Abnormal; Notable for the following components:   Sodium 122 (*)    Chloride 91 (*)    CO2 19 (*)    Creatinine, Ser 1.09 (*)    GFR, Estimated  54 (*)    All other components within normal limits  OSMOLALITY  SODIUM, URINE, RANDOM  OSMOLALITY, URINE    EKG None  Radiology DG Chest 1 View  Result Date: 11/20/2022 CLINICAL DATA:  Chest pain. EXAM: CHEST  1 VIEW COMPARISON:  Radiograph 09/09/2022.  CT 07/23/2022 FINDINGS: The cardiomediastinal contours are normal. Scarring and chain sutures in the right upper lung zone. Minimal streaky left lung base opacity pulmonary vasculature is normal. No pleural effusion, or pneumothorax. No acute osseous abnormalities are seen. Lower cervical spine hardware. IMPRESSION: Minimal streaky left lung base opacity, favor atelectasis. Electronically Signed   By: Narda Rutherford M.D.   On: 11/20/2022 18:00    Procedures Procedures: not indicated.   Medications Ordered in ED Medications - No data to display  ED Course/ Medical Decision Making/ A&P                             Medical Decision Making Amount and/or Complexity of Data Reviewed Labs: ordered.  Risk Decision regarding hospitalization.   Patient is 72 year old female who presents the ED today for high blood pressure and headache.  She has been having high blood pressures for the past week and reports taking her blood pressure multiple times a day.  She is called her cardiologist name adjustments to medications above her hypertension persist.  My differentials include: ACS, hypertensive urgency, anxiety, dehydration, etc. Social determinants of health include: Depression, social connection, housing.  Patient reports that she took her tramadol and Xanax in the waiting room prior to being seen, which has helped improve her nausea and headache at the time of evaluation.  Patient's exam was unremarkable.  Normal rate and rhythm on auscultation of the heart.  Lungs are clear  to auscultation bilaterally.  No weakness of the extremities or loss of sensation.  Labs show a sodium level of 122 on CMP, CBC is within normal limits. Chest  x-ray is unremarkable.  I consulted and spoke with the hospitalist, Dr. Allena Katz, who agreed to come down and evaluate the patient.  Patient admitted to the hospital for further management of her hyponatremia and high blood pressure.       Final Clinical Impression(s) / ED Diagnoses Final diagnoses:  Hyponatremia  Hypertension, unspecified type    Rx / DC Orders ED Discharge Orders     None         Maxwell Marion, PA-C 11/20/22 2135    Pricilla Loveless, MD 11/21/22 2138

## 2022-11-20 NOTE — ED Triage Notes (Signed)
Pt monitors her BP at home.  Has had several med changes recently.  Has continually checked her BP today and states she has a headache in the front of her head and down her neck.  Very anxious.  Has taken 3 xanax today.  168/102 with EMS. HR 80, 98%, RR 20
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
## 2022-11-20 NOTE — ED Provider Triage Note (Signed)
Emergency Medicine Provider Triage Evaluation Note  [name] , a 72 y.o. female  was evaluated in triage.  Pt complains of hypertension.  States he was fairly well-managed until a week ago.  She has been started on multiple new medications for hypertension and states that she cannot get it controlled.  It has been 160s over 100s over the last week.  She has some mild frontal headaches as well.  She was told to come to the emergency department by her son who is a Engineer, civil (consulting).  She has some shortness of breath but no chest pain  Review of Systems  Positive: As above Negative: As above  Physical Exam  BP (!) 132/103   Pulse 66   Temp 98.8 F (37.1 C) (Oral)   Resp 18   SpO2 94%  Gen:   Awake, no distress   Resp:  Normal effort  MSK:   Moves extremities without difficulty  Other:    Medical Decision Making  Medically screening exam initiated at 5:05 PM.  Appropriate orders placed.  [name] was informed that the remainder of the evaluation will be completed by another provider, this initial triage assessment does not replace that evaluation, and the importance of remaining in the ED until their evaluation is complete.  Workup initiated   Michelle Piper, Cordelia Poche 11/20/22 1705

## 2022-11-21 DIAGNOSIS — Y92009 Unspecified place in unspecified non-institutional (private) residence as the place of occurrence of the external cause: Secondary | ICD-10-CM | POA: Diagnosis not present

## 2022-11-21 DIAGNOSIS — F4323 Adjustment disorder with mixed anxiety and depressed mood: Secondary | ICD-10-CM | POA: Diagnosis present

## 2022-11-21 DIAGNOSIS — Z79890 Hormone replacement therapy (postmenopausal): Secondary | ICD-10-CM | POA: Diagnosis not present

## 2022-11-21 DIAGNOSIS — Z981 Arthrodesis status: Secondary | ICD-10-CM | POA: Diagnosis not present

## 2022-11-21 DIAGNOSIS — E871 Hypo-osmolality and hyponatremia: Secondary | ICD-10-CM | POA: Diagnosis not present

## 2022-11-21 DIAGNOSIS — Z9221 Personal history of antineoplastic chemotherapy: Secondary | ICD-10-CM | POA: Diagnosis not present

## 2022-11-21 DIAGNOSIS — T502X5A Adverse effect of carbonic-anhydrase inhibitors, benzothiadiazides and other diuretics, initial encounter: Secondary | ICD-10-CM | POA: Diagnosis present

## 2022-11-21 DIAGNOSIS — R519 Headache, unspecified: Secondary | ICD-10-CM | POA: Diagnosis present

## 2022-11-21 DIAGNOSIS — Z79899 Other long term (current) drug therapy: Secondary | ICD-10-CM | POA: Diagnosis not present

## 2022-11-21 DIAGNOSIS — E785 Hyperlipidemia, unspecified: Secondary | ICD-10-CM | POA: Diagnosis present

## 2022-11-21 DIAGNOSIS — Z888 Allergy status to other drugs, medicaments and biological substances status: Secondary | ICD-10-CM | POA: Diagnosis not present

## 2022-11-21 DIAGNOSIS — Z86718 Personal history of other venous thrombosis and embolism: Secondary | ICD-10-CM | POA: Diagnosis not present

## 2022-11-21 DIAGNOSIS — Z8542 Personal history of malignant neoplasm of other parts of uterus: Secondary | ICD-10-CM | POA: Diagnosis not present

## 2022-11-21 DIAGNOSIS — Z87820 Personal history of traumatic brain injury: Secondary | ICD-10-CM | POA: Diagnosis not present

## 2022-11-21 DIAGNOSIS — I129 Hypertensive chronic kidney disease with stage 1 through stage 4 chronic kidney disease, or unspecified chronic kidney disease: Secondary | ICD-10-CM | POA: Diagnosis present

## 2022-11-21 DIAGNOSIS — J4530 Mild persistent asthma, uncomplicated: Secondary | ICD-10-CM | POA: Diagnosis present

## 2022-11-21 DIAGNOSIS — E039 Hypothyroidism, unspecified: Secondary | ICD-10-CM | POA: Diagnosis present

## 2022-11-21 DIAGNOSIS — Z882 Allergy status to sulfonamides status: Secondary | ICD-10-CM | POA: Diagnosis not present

## 2022-11-21 DIAGNOSIS — Z885 Allergy status to narcotic agent status: Secondary | ICD-10-CM | POA: Diagnosis not present

## 2022-11-21 DIAGNOSIS — M797 Fibromyalgia: Secondary | ICD-10-CM | POA: Diagnosis present

## 2022-11-21 DIAGNOSIS — Z961 Presence of intraocular lens: Secondary | ICD-10-CM | POA: Diagnosis present

## 2022-11-21 DIAGNOSIS — Z881 Allergy status to other antibiotic agents status: Secondary | ICD-10-CM | POA: Diagnosis not present

## 2022-11-21 DIAGNOSIS — Z87442 Personal history of urinary calculi: Secondary | ICD-10-CM | POA: Diagnosis not present

## 2022-11-21 DIAGNOSIS — N1831 Chronic kidney disease, stage 3a: Secondary | ICD-10-CM | POA: Diagnosis present

## 2022-11-21 DIAGNOSIS — E669 Obesity, unspecified: Secondary | ICD-10-CM | POA: Diagnosis present

## 2022-11-21 LAB — BASIC METABOLIC PANEL
Anion gap: 9 (ref 5–15)
BUN: 18 mg/dL (ref 8–23)
CO2: 22 mmol/L (ref 22–32)
Calcium: 8.6 mg/dL — ABNORMAL LOW (ref 8.9–10.3)
Chloride: 93 mmol/L — ABNORMAL LOW (ref 98–111)
Creatinine, Ser: 1.22 mg/dL — ABNORMAL HIGH (ref 0.44–1.00)
GFR, Estimated: 47 mL/min — ABNORMAL LOW (ref >60)
Glucose, Bld: 97 mg/dL (ref 70–99)
Potassium: 3.7 mmol/L (ref 3.5–5.1)
Sodium: 124 mmol/L — ABNORMAL LOW (ref 135–145)

## 2022-11-21 LAB — CBC
HCT: 33.5 % — ABNORMAL LOW (ref 36.0–46.0)
Hemoglobin: 11.7 g/dL — ABNORMAL LOW (ref 12.0–15.0)
MCH: 31.5 pg (ref 26.0–34.0)
MCHC: 34.9 g/dL (ref 30.0–36.0)
MCV: 90.1 fL (ref 80.0–100.0)
Platelets: 323 10*3/uL (ref 150–400)
RBC: 3.72 MIL/uL — ABNORMAL LOW (ref 3.87–5.11)
RDW: 13 % (ref 11.5–15.5)
WBC: 6.4 10*3/uL (ref 4.0–10.5)
nRBC: 0 % (ref 0.0–0.2)

## 2022-11-21 LAB — SODIUM
Sodium: 127 mmol/L — ABNORMAL LOW (ref 135–145)
Sodium: 130 mmol/L — ABNORMAL LOW (ref 135–145)

## 2022-11-21 LAB — TSH: TSH: 5.064 u[IU]/mL — ABNORMAL HIGH (ref 0.350–4.500)

## 2022-11-21 MED ORDER — SODIUM CHLORIDE 0.9 % IV SOLN: INTRAVENOUS | Status: DC

## 2022-11-21 MED ORDER — METHOCARBAMOL 500 MG PO TABS
500.0000 mg | ORAL_TABLET | Freq: Four times a day (QID) | ORAL | Status: DC | PRN
  Administered 2022-11-21: 500 mg via ORAL
  Filled 2022-11-21: qty 1

## 2022-11-21 NOTE — Progress Notes (Signed)
PROGRESS NOTE    [name]  UJW:[id number] DOB: [birth year] DOA: 11/20/2022 PCP: Tresa Garter, MD   Brief Narrative:  [name] is a 72 y.o. 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    Assessment & Plan:   Principal Problem:   Hyponatremia Active Problems:   Hyperlipidemia   Hypothyroidism   HTN (hypertension)   Chronic kidney disease, stage 3a (HCC)   Adjustment disorder with mixed anxiety and depressed mood  Assessment and Plan:   Hyponatremia: Sodium 122 on admission.  Hypovolemic versus medication related. -Follow urine sodium, urine and serum osmolality -Hold Prozac, HCTZ, Remeron, and losartan -IV NS@75  mL/hour to continue for now and monitor repeat sodium level -Repeat sodium tonight and with morning labs -Possibly related to HCTZ use and poor dietary sodium intake   Hypertension uncontrolled at home -Holding HCTZ and losartan as above -Continue Coreg 12.5 mg BID -IV hydralazine as needed   CKD stage IIIa: Renal function stable.   Hypothyroidism: Continue Synthroid.   Hyperlipidemia: Continue rosuvastatin.   Depression/anxiety: Continue BuSpar and home Xanax.  Holding Prozac and Remeron.  Obesity BMI 30.73   DVT prophylaxis:Lovenox Code Status: Full Family Communication: None at bedside Disposition Plan:  Status is: Observation The patient will require care spanning > 2 midnights and should be moved to inpatient because: Need for ongoing IV fluid   Consultants:  None  Procedures:  None  Antimicrobials:  None   Subjective: Patient seen and evaluated today with no new acute complaints or concerns. No acute concerns or events noted overnight.  She continues to feel weak.  Objective: Vitals:   11/20/22 2100 11/20/22 2139  11/20/22 2243 11/21/22 0527  BP: (!) 161/97  (!) 151/112 135/84  Pulse: 63  62 63  Resp: 18  18 18   Temp:  98 F (36.7 C) 97.6 F (36.4 C) 98 F (36.7 C)  TempSrc:   Oral Oral  SpO2: 100%  100% 99%  Weight:    75.8 kg    Intake/Output Summary (Last 24 hours) at 11/21/2022 0655 Last data filed at 11/21/2022 0404 Gross per 24 hour  Intake 781.61 ml  Output --  Net 781.61 ml   Filed Weights   11/21/22 0527  Weight: 75.8 kg    Examination:  General exam: Appears calm and comfortable  Respiratory system: Clear to auscultation. Respiratory effort normal. Cardiovascular system: S1 & S2 heard, RRR.  Gastrointestinal system: Abdomen is soft Central nervous system: Alert and awake Extremities: No edema Skin: No significant lesions noted Psychiatry: Flat affect.    Data Reviewed: I have personally reviewed following labs and imaging studies  CBC: Recent Labs  Lab 11/20/22 1704  WBC 8.2  NEUTROABS 5.3  HGB 12.1  HCT 34.1*  MCV 86.8  PLT 339   Basic Metabolic Panel: Recent Labs  Lab 11/20/22 1704 11/21/22 0001  NA 122* 127*  K 3.8  --   CL 91*  --   CO2 19*  --   GLUCOSE 99  --   BUN 16  --   CREATININE 1.09*  --   CALCIUM 8.9  --    GFR: Estimated Creatinine Clearance: 44.3 mL/min (A) (by C-G formula based on SCr of 1.09 mg/dL (H)). Liver Function Tests: Recent Labs  Lab 11/20/22 1704  AST 23  ALT 18  ALKPHOS 105  BILITOT 0.7  PROT 6.9  ALBUMIN 3.9   No results for input(s): "LIPASE", "AMYLASE" in the last 168 hours. No results for input(s): "AMMONIA" in the last 168 hours. Coagulation Profile: No results for input(s): "INR", "PROTIME" in the last 168 hours. Cardiac Enzymes: No results for input(s): "CKTOTAL", "CKMB", "CKMBINDEX", "TROPONINI" in the last 168 hours. BNP (last 3 results) No results for input(s): "PROBNP" in the last 8760 hours. HbA1C: No results for input(s): "HGBA1C" in the last 72 hours. CBG: No results for input(s): "GLUCAP"  in the last 168 hours. Lipid Profile: No results for input(s): "CHOL", "HDL", "LDLCALC", "TRIG", "CHOLHDL", "LDLDIRECT" in the last 72 hours. Thyroid Function Tests: No results for input(s): "TSH", "T4TOTAL", "FREET4", "T3FREE", "THYROIDAB" in the last 72 hours. Anemia Panel: No results for input(s): "VITAMINB12", "FOLATE", "FERRITIN", "TIBC", "IRON", "RETICCTPCT" in the last 72 hours. Sepsis Labs: No results for input(s): "PROCALCITON", "LATICACIDVEN" in the last 168 hours.  No results found for this or any previous visit (from the past 240 hour(s)).       Radiology Studies: DG Chest 1 View  Result Date: 11/20/2022 CLINICAL DATA:  Chest pain. EXAM: CHEST  1 VIEW COMPARISON:  Radiograph 09/09/2022.  CT 07/23/2022 FINDINGS: The cardiomediastinal contours are normal. Scarring and chain sutures in the right upper lung zone. Minimal streaky left lung base opacity pulmonary vasculature is normal. No pleural effusion, or pneumothorax. No acute osseous abnormalities are seen. Lower cervical spine hardware. IMPRESSION: Minimal streaky left lung base opacity, favor atelectasis. Electronically Signed   By: Narda Rutherford M.D.   On: 11/20/2022 18:00        Scheduled Meds:  busPIRone  30 mg Oral BID   carvedilol  12.5 mg Oral BID WC   enoxaparin (LOVENOX) injection  40 mg Subcutaneous Q24H   levothyroxine  100 mcg Oral QAC breakfast   rosuvastatin  10 mg Oral Daily   Continuous Infusions:  sodium chloride 75 mL/hr at 11/21/22 0404     LOS: 0 days    Time spent: 35 minutes    Walaa Carel Hoover Brunette, DO [facility]  If 7PM-7AM, please contact night-coverage [email or web address] 11/21/2022, 6:55 AM

## 2022-11-22 DIAGNOSIS — E871 Hypo-osmolality and hyponatremia: Secondary | ICD-10-CM | POA: Diagnosis not present

## 2022-11-22 LAB — CBC
HCT: 34.6 % — ABNORMAL LOW (ref 36.0–46.0)
Hemoglobin: 11.9 g/dL — ABNORMAL LOW (ref 12.0–15.0)
MCH: 31.6 pg (ref 26.0–34.0)
MCHC: 34.4 g/dL (ref 30.0–36.0)
MCV: 92 fL (ref 80.0–100.0)
Platelets: 320 10*3/uL (ref 150–400)
RBC: 3.76 MIL/uL — ABNORMAL LOW (ref 3.87–5.11)
RDW: 13.1 % (ref 11.5–15.5)
WBC: 5.6 10*3/uL (ref 4.0–10.5)
nRBC: 0 % (ref 0.0–0.2)

## 2022-11-22 LAB — BASIC METABOLIC PANEL
Anion gap: 10 (ref 5–15)
BUN: 27 mg/dL — ABNORMAL HIGH (ref 8–23)
CO2: 22 mmol/L (ref 22–32)
Calcium: 9.1 mg/dL (ref 8.9–10.3)
Chloride: 99 mmol/L (ref 98–111)
Creatinine, Ser: 1.16 mg/dL — ABNORMAL HIGH (ref 0.44–1.00)
GFR, Estimated: 50 mL/min — ABNORMAL LOW (ref >60)
Glucose, Bld: 94 mg/dL (ref 70–99)
Potassium: 3.9 mmol/L (ref 3.5–5.1)
Sodium: 131 mmol/L — ABNORMAL LOW (ref 135–145)

## 2022-11-22 LAB — MAGNESIUM: Magnesium: 1.7 mg/dL (ref 1.7–2.4)

## 2022-11-22 MED ORDER — HYDRALAZINE HCL 10 MG PO TABS: 20.0000 mg | ORAL_TABLET | Freq: Three times a day (TID) | ORAL | 0 refills | Status: DC

## 2022-11-22 MED ORDER — CARVEDILOL 12.5 MG PO TABS: 12.5000 mg | ORAL_TABLET | Freq: Two times a day (BID) | ORAL | 0 refills | Status: DC

## 2022-11-22 NOTE — Plan of Care (Signed)

## 2022-11-22 NOTE — Discharge Summary (Signed)
Physician Discharge Summary  [name] ZOX:[id number] DOB: 23-Mar-1951 DOA: 11/20/2022  PCP: Charlotte Garter, MD  Admit date: 11/20/2022  Discharge date: 11/22/2022  Admitted From:Home  Disposition:  Home  Recommendations for Outpatient Follow-up:  Follow up with PCP in 1-2 weeks Follow-up with cardiology as scheduled for this week Remain off of hydrochlorothiazide and follow-up BMP in 1 week to ensure sodium levels have further improved Remain on Coreg 12.5 mg twice daily Remain on hydralazine 20 mg now 3 times daily Remain on other home medications as prior  Home Health: None  Equipment/Devices: None  Discharge Condition:Stable  CODE STATUS: Full  Diet recommendation: Heart Healthy  Brief/Interim Summary:  [name] is a 72 y.o. 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  She was also noted to have uncontrolled hypertension at home and had medication adjustments as noted above.  She received some IV normal saline with improvement in sodium levels and has been instructed to remain off of hydrochlorothiazide as this appears to be primarily contributing to her hyponatremia.  Her blood pressures have remained well-controlled on medication adjustments as noted and she will have close follow-up arranged.  No other acute events or concerns noted and she is stable for discharge.  Discharge Diagnoses:  Principal Problem:   Hyponatremia Active Problems:   Hyperlipidemia   Hypothyroidism   HTN (hypertension)   Chronic kidney disease, stage 3a (HCC)   Adjustment disorder with mixed anxiety and depressed mood  Principal discharge diagnosis: Uncontrolled hypertension along with hyponatremia and likely related to poor solute intake as well as HCTZ use.  Discharge  Instructions  Discharge Instructions     Diet - low sodium heart healthy   Complete by: As directed    Increase activity slowly   Complete by: As directed       Allergies as of 11/22/2022       Reactions   Buprenorphine Hcl Other (See Comments)   Severe headache, confusion   Morphine And Codeine Other (See Comments)   Severe headache   Amlodipine Swelling   Buprenorphine Other (See Comments)   Confusion, headache   Cephalexin Nausea And Vomiting   Other Other (See Comments)   Trees & Grass = allergic symptoms   Sulfamethoxazole-trimethoprim Nausea And Vomiting, Other (See Comments)   Dizziness    Sumatriptan Other (See Comments)   Codeine Other (See Comments)   Disorientation   Dust Mite Extract Cough   Molds & Smuts Cough        Medication List     STOP taking these medications    doxycycline 100 MG capsule Commonly known as: VIBRAMYCIN   hydrochlorothiazide 25 MG tablet Commonly known as: HYDRODIURIL       TAKE these medications    acetaminophen 325 MG tablet Commonly known as: TYLENOL Take 2 tablets (650 mg total) by mouth every 6 (six) hours as needed for moderate pain.   ALPRAZolam 1 MG tablet Commonly known as: XANAX Take 1 tablet (1 mg total) by mouth 3 (three) times daily as needed for anxiety.   ascorbic acid 500 MG tablet Commonly known as: VITAMIN C Take 500 mg by mouth daily.   aspirin EC 81 MG tablet Take 1 tablet (81 mg total) by mouth daily.   busPIRone 30 MG tablet Commonly known as: BUSPAR TAKE 1 TABLET BY MOUTH TWICE  DAILY   carvedilol 6.25  MG tablet Commonly known as: COREG Take 1 tablet (6.25 mg total) by mouth 2 (two) times daily. What changed: Another medication with the same name was added. Make sure you understand how and when to take each.   carvedilol 12.5 MG tablet Commonly known as: COREG Take 1 tablet (12.5 mg total) by mouth 2 (two) times daily with a meal. What changed: You were already taking a medication  with the same name, and this prescription was added. Make sure you understand how and when to take each.   colesevelam 625 MG tablet Commonly known as: WELCHOL Take 2 tablets (1,250 mg total) by mouth 2 (two) times daily with a meal.   cyanocobalamin 1000 MCG tablet Commonly known as: VITAMIN B12 Take 1,000 mcg by mouth daily.   D-Mannose 500 MG Caps Take 500 mg by mouth 2 (two) times daily. WITH CRANBERRY & DANDELION EXTRACT   D3 5000 125 MCG (5000 UT) capsule Generic drug: Cholecalciferol Take 5,000 Units by mouth daily.   diphenoxylate-atropine 2.5-0.025 MG tablet Commonly known as: LOMOTIL TAKE 1 OR 2 TABLETS BY MOUTH 4 TIMES A DAY AS NEEDED FOR DIARRHEA/LOSSE STOOLS   esomeprazole 40 MG capsule Commonly known as: NEXIUM TAKE 1 CAPSULE (40 MG TOTAL) BY MOUTH EVERY MORNING.   FLUoxetine 40 MG capsule Commonly known as: PROZAC TAKE 1 CAPSULE BY MOUTH IN THE  MORNING   hydrALAZINE 10 MG tablet Commonly known as: APRESOLINE Take 2 tablets (20 mg total) by mouth 3 (three) times daily. What changed: when to take this   ipratropium-albuterol 0.5-2.5 (3) MG/3ML Soln Commonly known as: DUONEB Take 3 mLs by nebulization every 6 (six) hours as needed (Asthma).   iron polysaccharides 150 MG capsule Commonly known as: NIFEREX Take 1 capsule (150 mg total) by mouth daily.   levothyroxine 100 MCG tablet Commonly known as: SYNTHROID TAKE 1 TABLET BY MOUTH DAILY BEFORE BREAKFAST.   loperamide 2 MG tablet Commonly known as: IMODIUM A-D Take 2 mg by mouth 4 (four) times daily as needed for diarrhea or loose stools.   losartan 50 MG tablet Commonly known as: COZAAR Take 1 tablet (50 mg total) by mouth daily.   METAMUCIL FIBER PO Take 1 capsule by mouth in the morning and at bedtime.   methylphenidate 10 MG tablet Commonly known as: Ritalin Take 1 tablet (10 mg total) by mouth in the morning.   mirtazapine 30 MG tablet Commonly known as: REMERON TAKE 1 TABLET BY MOUTH AT   BEDTIME   omega-3 acid ethyl esters 1 g capsule Commonly known as: LOVAZA TAKE 2 CAPSULES BY MOUTH 2 TIMES DAILY.   ondansetron 4 MG disintegrating tablet Commonly known as: ZOFRAN-ODT TAKE 1 TABLET BY MOUTH EVERY 8 HOURS AS NEEDED FOR NAUSEA AND VOMITING   OVER THE COUNTER MEDICATION Take 1 capsule by mouth daily. 7 MUSHROOM BLEND   rosuvastatin 10 MG tablet Commonly known as: CRESTOR Take 1 tablet (10 mg total) by mouth daily.   SYSTANE OP Place 1 drop into both eyes 2 (two) times daily as needed (dry eyes).   traMADol 50 MG tablet Commonly known as: ULTRAM Take 1 tablet (50 mg total) by mouth every 8 (eight) hours as needed for moderate pain.        Follow-up Information     Plotnikov, Georgina Quint, MD. Schedule an appointment as soon as possible for a visit in 1 week(s).   Specialty: Internal Medicine Contact information: [address] [place] [address] [phone]  Allergies  Allergen Reactions   Buprenorphine Hcl Other (See Comments)    Severe headache, confusion   Morphine And Codeine Other (See Comments)    Severe headache   Amlodipine Swelling   Buprenorphine Other (See Comments)    Confusion, headache   Cephalexin Nausea And Vomiting   Other Other (See Comments)    Trees & Grass = allergic symptoms   Sulfamethoxazole-Trimethoprim Nausea And Vomiting and Other (See Comments)    Dizziness    Sumatriptan Other (See Comments)   Codeine Other (See Comments)    Disorientation   Dust Mite Extract Cough   Molds & Smuts Cough    Consultations: None   Procedures/Studies: DG Chest 1 View  Result Date: 11/20/2022 CLINICAL DATA:  Chest pain. EXAM: CHEST  1 VIEW COMPARISON:  Radiograph 09/09/2022.  CT 07/23/2022 FINDINGS: The cardiomediastinal contours are normal. Scarring and chain sutures in the right upper lung zone. Minimal streaky left lung base opacity pulmonary vasculature is normal. No pleural effusion, or pneumothorax.  No acute osseous abnormalities are seen. Lower cervical spine hardware. IMPRESSION: Minimal streaky left lung base opacity, favor atelectasis. Electronically Signed   By: Narda Rutherford M.D.   On: 11/20/2022 18:00     Discharge Exam: Vitals:   11/22/22 0509 11/22/22 0831  BP: (!) 127/53 (!) 151/87  Pulse: 64 64  Resp: 18   Temp: 97.7 F (36.5 C) 97.8 F (36.6 C)  SpO2: 97% 98%   Vitals:   11/21/22 2041 11/22/22 0509 11/22/22 0512 11/22/22 0831  BP: 132/83 (!) 127/53  (!) 151/87  Pulse: 69 64  64  Resp: 18 18    Temp: 98.1 F (36.7 C) 97.7 F (36.5 C)  97.8 F (36.6 C)  TempSrc: Oral Oral  Oral  SpO2: 95% 97%  98%  Weight:   72.6 kg     General: Pt is alert, awake, not in acute distress Cardiovascular: RRR, S1/S2 +, no rubs, no gallops Respiratory: CTA bilaterally, no wheezing, no rhonchi Abdominal: Soft, NT, ND, bowel sounds + Extremities: no edema, no cyanosis    The results of significant diagnostics from this hospitalization (including imaging, microbiology, ancillary and laboratory) are listed below for reference.     Microbiology: No results found for this or any previous visit (from the past 240 hour(s)).   Labs: BNP (last 3 results) No results for input(s): "BNP" in the last 8760 hours. Basic Metabolic Panel: Recent Labs  Lab 11/20/22 1704 11/21/22 0001 11/21/22 0605 11/21/22 1228 11/22/22 0551  NA 122* 127* 124* 130* 131*  K 3.8  --  3.7  --  3.9  CL 91*  --  93*  --  99  CO2 19*  --  22  --  22  GLUCOSE 99  --  97  --  94  BUN 16  --  18  --  27*  CREATININE 1.09*  --  1.22*  --  1.16*  CALCIUM 8.9  --  8.6*  --  9.1  MG  --   --   --   --  1.7   Liver Function Tests: Recent Labs  Lab 11/20/22 1704  AST 23  ALT 18  ALKPHOS 105  BILITOT 0.7  PROT 6.9  ALBUMIN 3.9   No results for input(s): "LIPASE", "AMYLASE" in the last 168 hours. No results for input(s): "AMMONIA" in the last 168 hours. CBC: Recent Labs  Lab 11/20/22 1704  11/21/22 0605 11/22/22 0551  WBC 8.2 6.4 5.6  NEUTROABS 5.3  --   --  HGB 12.1 11.7* 11.9*  HCT 34.1* 33.5* 34.6*  MCV 86.8 90.1 92.0  PLT 339 323 320   Cardiac Enzymes: No results for input(s): "CKTOTAL", "CKMB", "CKMBINDEX", "TROPONINI" in the last 168 hours. BNP: Invalid input(s): "POCBNP" CBG: No results for input(s): "GLUCAP" in the last 168 hours. D-Dimer No results for input(s): "DDIMER" in the last 72 hours. Hgb A1c No results for input(s): "HGBA1C" in the last 72 hours. Lipid Profile No results for input(s): "CHOL", "HDL", "LDLCALC", "TRIG", "CHOLHDL", "LDLDIRECT" in the last 72 hours. Thyroid function studies Recent Labs    11/21/22 0605  TSH 5.064*   Anemia work up No results for input(s): "VITAMINB12", "FOLATE", "FERRITIN", "TIBC", "IRON", "RETICCTPCT" in the last 72 hours. Urinalysis    Component Value Date/Time   COLORURINE YELLOW 10/22/2022 1654   APPEARANCEUR CLEAR 10/22/2022 1654   LABSPEC 1.005 10/22/2022 1654   PHURINE 5.0 10/22/2022 1654   GLUCOSEU NEGATIVE 10/22/2022 1654   GLUCOSEU NEGATIVE 08/16/2022 1700   HGBUR NEGATIVE 10/22/2022 1654   BILIRUBINUR NEGATIVE 10/22/2022 1654   BILIRUBINUR negative 05/21/2022 1606   KETONESUR NEGATIVE 10/22/2022 1654   PROTEINUR NEGATIVE 10/22/2022 1654   UROBILINOGEN 0.2 08/16/2022 1700   NITRITE NEGATIVE 10/22/2022 1654   LEUKOCYTESUR NEGATIVE 10/22/2022 1654   Sepsis Labs Recent Labs  Lab 11/20/22 1704 11/21/22 0605 11/22/22 0551  WBC 8.2 6.4 5.6   Microbiology No results found for this or any previous visit (from the past 240 hour(s)).   Time coordinating discharge: 35 minutes  SIGNED:   Erick Blinks, DO [facility] 11/22/2022, 9:40 AM  If 7PM-7AM, please contact night-coverage [email or web address]

## 2022-11-23 ENCOUNTER — Encounter: Payer: Self-pay | Admitting: *Deleted

## 2022-11-23 ENCOUNTER — Telehealth: Payer: Self-pay | Admitting: *Deleted

## 2022-11-23 ENCOUNTER — Ambulatory Visit: Payer: Self-pay

## 2022-11-23 DIAGNOSIS — I10 Essential (primary) hypertension: Secondary | ICD-10-CM

## 2022-11-23 NOTE — Transitions of Care (Post Inpatient/ED Visit) (Signed)
11/23/2022  Name: [name] MRN: [id number] DOB: 1950/07/19  Today's TOC FU Call Status: Today's TOC FU Call Status:: Successful TOC FU Call Competed TOC FU Call Complete Date: 11/23/22  Transition Care Management Follow-up Telephone Call Date of Discharge: 11/22/22 Discharge Facility: [facility]) Type of Discharge: Inpatient Admission Primary Inpatient Discharge Diagnosis:: hyponatremia; uncontrolled HTN/ anxiety How have you been since you were released from the hospital?: Better ("I am doing better, my BP's are much better now; it was just 124/79; I am very confused about this carvedilol instructions on my papers, thank you for figuring that out... I will ask Dr. Posey Rea about the possibilty of getting home health PT started") Any questions or concerns?: Yes Patient Questions/Concerns:: questions about why there are 2 different doses of carevedilol on her discharge papers- said she was told to increase dose to 12.5 mg BID- she is confused as to why the 6.25 mg is still on her medication list; reports is very weak post-hospital discharge- would like to consider home health PT Patient Questions/Concerns Addressed: Other: (thorough review of hospital discharge provider progress and discharge notes-- verified the instructions were clearly to increase carvedilol to 12.5 mg BID and instructed patient to take that dose (NOT the 6.25 mg dose); education provided home health)  Items Reviewed: Did you receive and understand the discharge instructions provided?: Yes (thoroughly reviewed with patient who verbalizes good understanding of same) Medications obtained,verified, and reconciled?: Partial Review Completed Reason for Partial Mediation Review: Partial review completed, patient stated "too exhausted" for full review; confirmed patient obtained/ is taking all newly Rx'd medications as instructed; self-manages medications and addressed all questions and  medications today Any new  allergies since your discharge?: No Dietary orders reviewed?: Yes Type of Diet Ordered:: "As healthy as possible" Do you have support at home?: Yes People in Home: alone Name of Support/Comfort Primary Source: Reports essentially independent in self-care activities; resides alone; supportive local son (who is a nnurse) assists as/ if needed/ indicated  Medications Reviewed Today: Medications Reviewed Today     Reviewed by Michaela Corner, RN (Registered Nurse) on 11/23/22 at 1534  Med List Status: <None>   Medication Order Taking? Sig Documenting Provider Last Dose Status Informant  acetaminophen (TYLENOL) 325 MG tablet [id number]  Take 2 tablets (650 mg total) by mouth every 6 (six) hours as needed for moderate pain. Plotnikov, Georgina Quint, MD  Active Self  ALPRAZolam Prudy Feeler) 1 MG tablet [id number]  Take 1 tablet (1 mg total) by mouth 3 (three) times daily as needed for anxiety. Plotnikov, Georgina Quint, MD  Active   aspirin EC 81 MG tablet [id number]  Take 1 tablet (81 mg total) by mouth daily. Plotnikov, Georgina Quint, MD  Active Self  busPIRone (BUSPAR) 30 MG tablet [id number]  TAKE 1 TABLET BY MOUTH TWICE  DAILY Plotnikov, Georgina Quint, MD  Active   carvedilol (COREG) 12.5 MG tablet [id number] Yes Take 1 tablet (12.5 mg total) by mouth 2 (two) times daily with a meal. Maurilio Lovely D, DO Taking Active            Med Note Michaela Corner   Tue Nov 23, 2022  2:52 PM) 11/23/22: Reports during TOC call this was increased to 12.5 mg BID during hospitalization: verified for patient that discharging hospital provider wrote in progress and discharging notes for her to take 12.5 mg po BID-- patient confirms this is how she is currently taking   carvedilol (COREG) 6.25 MG tablet [id number] No  Take 1 tablet (6.25 mg total) by mouth 2 (two) times daily.  Patient not taking: Reported on 11/23/2022   Thomasene Ripple, DO Not Taking Active            Med Note Michaela Corner   Tue Nov 23, 2022  2:51 PM) 11/23/22: Reports during  [facility] call she is confused about why this is on medication list: she states this was increased to 12/5 mg BID during hospitalization: verified for patient that discharging hospital provider wrote in progress and discharging notes for her to take 12.5 mg po BID  Cholecalciferol (D3 5000) 125 MCG (5000 UT) capsule [id number]  Take 5,000 Units by mouth daily. [provider]  Active Self  colesevelam (WELCHOL) 625 MG tablet [id number]  Take 2 tablets (1,250 mg total) by mouth 2 (two) times daily with a meal. Plotnikov, Georgina Quint, MD  Expired 09/27/22 2359   D-Mannose 500 MG CAPS [id number]  Take 500 mg by mouth 2 (two) times daily. WITH CRANBERRY & DANDELION EXTRACT Plotnikov, Georgina Quint, MD  Active   diphenoxylate-atropine (LOMOTIL) 2.5-0.025 MG tablet [id number]  TAKE 1 OR 2 TABLETS BY MOUTH 4 TIMES A DAY AS NEEDED FOR DIARRHEA/LOSSE STOOLS Plotnikov, Georgina Quint, MD  Active   esomeprazole (NEXIUM) 40 MG capsule [id number]  TAKE 1 CAPSULE (40 MG TOTAL) BY MOUTH EVERY MORNING. Plotnikov, Georgina Quint, MD  Active    Patient not taking:   Discontinued 11/05/19 0627 FLUoxetine (PROZAC) 40 MG capsule [id number]  TAKE 1 CAPSULE BY MOUTH IN THE  MORNING Plotnikov, Georgina Quint, MD  Active   hydrALAZINE (APRESOLINE) 10 MG tablet [id number]  Take 2 tablets (20 mg total) by mouth 3 (three) times daily. Sherryll Burger, Pratik D, DO  Active   ipratropium-albuterol (DUONEB) 0.5-2.5 (3) MG/3ML SOLN [id number]  Take 3 mLs by nebulization every 6 (six) hours as needed (Asthma). [provider]  Active Self  iron polysaccharides (NIFEREX) 150 MG capsule [id number]  Take 1 capsule (150 mg total) by mouth daily. Plotnikov, Georgina Quint, MD  Active   levothyroxine (SYNTHROID) 100 MCG tablet [id number]  TAKE 1 TABLET BY MOUTH DAILY BEFORE BREAKFAST. Plotnikov, Georgina Quint, MD  Active   loperamide (IMODIUM A-D) 2 MG tablet [id number]  Take 2 mg by mouth 4 (four) times daily as needed for diarrhea or loose stools. [provider]  Active  Self  losartan (COZAAR) 50 MG tablet [id number]  Take 1 tablet (50 mg total) by mouth daily. Thomasene Ripple DO  Active   METAMUCIL FIBER PO [id number]  Take 1 capsule by mouth in the morning and at bedtime. [provider]  Active Self  methylphenidate (RITALIN) 10 MG tablet [id number]  Take 1 tablet (10 mg total) by mouth in the morning. Plotnikov, Georgina Quint, MD  Active   mirtazapine (REMERON) 30 MG tablet [id number]  TAKE 1 TABLET BY MOUTH AT  BEDTIME Plotnikov, Georgina Quint, MD  Active   omega-3 acid ethyl esters (LOVAZA) 1 g capsule [id number]  TAKE 2 CAPSULES BY MOUTH 2 TIMES DAILY. Plotnikov, Georgina Quint, MD  Active   ondansetron (ZOFRAN-ODT) 4 MG disintegrating tablet [id number]  TAKE 1 TABLET BY MOUTH EVERY 8 HOURS AS NEEDED FOR NAUSEA AND VOMITING [provider]  Active   OVER THE COUNTER MEDICATION [id number]  Take 1 capsule by mouth daily. 7 MUSHROOM BLEND [provider]  Active Self  Polyethyl Glycol-Propyl Glycol (SYSTANE OP) [id number]  Place 1 drop into both eyes 2 (two) times daily as needed (dry eyes).  [provider]  Active Self  rosuvastatin (CRESTOR) 10 MG tablet [id number]  Take 1 tablet (10 mg total) by mouth daily. Tobb, Kardie, DO  Active   traMADol (ULTRAM) 50 MG tablet [id number]  Take 1 tablet (50 mg total) by mouth every 8 (eight) hours as needed for moderate pain. Plotnikov, Georgina Quint, MD  Active   vitamin B-12 (CYANOCOBALAMIN) 1000 MCG tablet [id number]  Take 1,000 mcg by mouth daily. [provider]  Active Self  vitamin C (ASCORBIC ACID) 500 MG tablet [id number]  Take 500 mg by mouth daily. [provider]  Active Self           Home Care and Equipment/Supplies: Were Home Health Services Ordered?: No Any new equipment or medical supplies ordered?: No  Functional Questionnaire: Do you need assistance with bathing/showering or dressing?: No Do you need assistance with meal preparation?: No Do you need assistance with  eating?: No Do you have difficulty maintaining continence: No Do you need assistance with getting out of bed/getting out of a chair/moving?: No Do you have difficulty managing or taking your medications?: No  Follow up appointments reviewed: PCP Follow-up appointment confirmed?: Yes Date of PCP follow-up appointment?: 11/30/22 Follow-up Provider: PCP Specialist Hospital Follow-up appointment confirmed?: Yes Date of Specialist follow-up appointment?: 11/25/22 Follow-Up Specialty Provider:: Cardiology provider Do you need transportation to your follow-up appointment?: No Do you understand care options if your condition(s) worsen?: Yes-patient verbalized understanding  SDOH Interventions Today    Flowsheet Row Most Recent Value  SDOH Interventions   Food Insecurity Interventions Intervention Not Indicated  Transportation Interventions Other (Comment)  [Reports currently drives self, but needs to know assistance resources due to progressive health issues- care guide referral placed]      TOC Interventions Today    Flowsheet Row Most Recent Value  TOC Interventions   TOC Interventions Discussed/Reviewed TOC Interventions Discussed  [provided my direct contact information should questions/ concerns/ needs arise post-TOC call, prior to RN CM telephone visit scheduled for 12/09/22]      Interventions Today    Flowsheet Row Most Recent Value  Chronic Disease   Chronic disease during today's visit Other  [hyponatremia, uncontrolled HTN/ anxiety]  General Interventions   General Interventions Discussed/Reviewed General Interventions Discussed, Communication with, Doctor Visits, Referral to Nurse, Community Resources  [facility] Guide referral for provision of transportation resources for possible needs in future]  Doctor Visits Discussed/Reviewed Doctor Visits Discussed, PCP, Specialist  PCP/Specialist Visits Compliance with follow-up visit  Communication with RN,  PCP/Specialists  [scheduled with RN CM Care Coordinator for follow up telephone visit on 12/09/22,  made PCP aware of patient's desire for home health services]  Exercise Interventions   Exercise Discussed/Reviewed Exercise Discussed  [role of home health PT- process to initiate services, discuss with PCP at time of HFU OV]  Education Interventions   Education Provided Provided Education  Provided Verbal Education On Medication, Other  [clarified carvedilol instructions post-hospital discharge,  process to initiate home health services]  Mental Health Interventions   Mental Health Discussed/Reviewed Other  [confirmed patient is active with psychiatric provider]  Nutrition Interventions   Nutrition Discussed/Reviewed Nutrition Discussed  Pharmacy Interventions   Pharmacy Dicussed/Reviewed Pharmacy Topics Discussed  Safety Interventions   Safety Discussed/Reviewed Safety Discussed      Caryl Pina, RN, BSN, CCRN Alumnus RN CM Care Coordination/ Transition of Care- [facility] Care Management [phone]: direct office

## 2022-11-23 NOTE — Chronic Care Management (AMB) (Signed)
   11/23/2022  Camri Molloy Masters-Marks 10-04-1950 [id number]   Reason for Encounter: Patient is not currently enrolled in the CCM program. CCM status changed to previously enrolled  Alto Denver RN, MSN, CCM RN Care Manager  Chronic Care Management Direct Number: [phone]

## 2022-11-24 ENCOUNTER — Telehealth: Payer: Self-pay

## 2022-11-24 ENCOUNTER — Telehealth: Payer: Self-pay | Admitting: Cardiology

## 2022-11-24 ENCOUNTER — Ambulatory Visit (INDEPENDENT_AMBULATORY_CARE_PROVIDER_SITE_OTHER): Payer: 59 | Admitting: Psychology

## 2022-11-24 DIAGNOSIS — F4323 Adjustment disorder with mixed anxiety and depressed mood: Secondary | ICD-10-CM

## 2022-11-24 DIAGNOSIS — F4321 Adjustment disorder with depressed mood: Secondary | ICD-10-CM | POA: Diagnosis not present

## 2022-11-24 NOTE — Telephone Encounter (Signed)
Called patient back and relayed Dr. Mallory Shirk message she is aware patient was in hospital, that chart has been reviewed and no additional labs are needed at this time. Patient verbalizes understanding.

## 2022-11-24 NOTE — Telephone Encounter (Signed)
   Telephone encounter was:  Successful.  11/24/2022 Name: [name] MRN: [id number] DOB: 08/21/1950  [name] is a 72 y.o. year old female who is a primary care patient of Plotnikov, Georgina Quint, MD . The community resource team was consulted for assistance with Transportation in home care and moms meals   Care guide performed the following interventions: Patient provided with information about care guide support team and interviewed to confirm resource needs.Patient has needs for transportation, moms meals and in home care. Patient has all the resources covered with [facility]. I have mailed additional transportation resources for the patient for [facility]   Follow Up Plan:  No further follow up planned at this time. The patient has been provided with needed resources.    Lenard For[facility] Guide, [facility] [phone] [address] [place], [place], [place] [address] Phone: [phone] Email: [email or web address]

## 2022-11-24 NOTE — Telephone Encounter (Signed)
Pt called in today. She would like for Dr. Servando Salina to review her recent hospital stay notes and if she would like any labs drawn to please let her PCP know so she can have them drawn at her appt next week.

## 2022-11-24 NOTE — Progress Notes (Signed)
[name] Time: 4:10p-5:00 p50 minutes   [name] is a 72 y.o. 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 (Status: maintained) -- No Description Entered  Diminished interest in or enjoyment of activities. (Status: maintained) -- No Description Entered  Lack of energy. (Status: maintained) -- No Description Entered  Sad affect, social withdrawal, anxiety, loss of interest in activities, and low energy. (Status: maintained) -- No Description Entered  Medication Status compliance  Safety unspecified If Suicidal or Homicidal State Action Taken: unspecified  Current Risk:  Medications Buspar (Dosage: 30mg  2X/day)  New Medication (Dosage: 250mg  2x/day)  New Medication (Dosage: 250mg  2x/day)  Prozac (Dosage: 40mg  daily)  Remeron (Dosage: 30mg  nightly)  xanax (Dosage: .5mg  2-3X/day)  Objectives Related Problem: Recognize, accept, and cope with feelings of depression. Description: Identify and replace thoughts and beliefs that support depression. Target Date: 2023-04-29 Frequency: Daily Modality: individual Progress: 75%  Related Problem: Recognize, accept, and cope with feelings of depression. Description: Verbalize an understanding of healthy and unhealthy emotions with the intent of increasing the use of healthy emotions to guide actions. Target Date: 2023-04-29 Frequency: Daily Modality: individual Progress: 85%  Related Problem: Recognize, accept, and cope with feelings of depression. Description: Increasingly verbalize hopeful and positive statements regarding self, others, and the future. Target Date: 2022-04-28 Frequency: Daily Modality: individual Progress:  100%  Related Problem: Reduce fear, anxiety, and worry associated with the medical condition. Description: Share with significant others efforts to adapt successfully to the medical condition/diagnosis. Target Date: 2023-04-29 Frequency: Daily Modality: individual Progress: 85%  Related Problem: Reduce fear, anxiety, and worry associated with the medical condition. Description: Engage in social, productive, and recreational activities that are possible in spite of medical condition. Target Date: 2023-04-29 Frequency: Daily Modality: individual Progress: 90%  Related Problem: Reduce fear, anxiety, and worry associated with the medical condition. Description: Identify the coping skills and sources of emotional support that have been beneficial in the past. Target Date: 2023-04-29 Frequency: Daily Modality: individual Progress: 80%  Client Response full compliance  Service Location Location, [address] [place], [place] [address]  Service Code cpt [phone]  Related past to present  Facilitate problem solving  Identify/label emotions  Validate/empathize  Self care activities  Lifestyle change (exercise, nutrition)  Assess/facilitate readiness to change  Relaxation training  Rationally challenge thoughts or beliefs/cognitive restructuring  Comments  Dx: F43.21  Meds: Prozac, [name], patient is struggling to rebuild her life and adjust to her new circumstances. She suffers a negative self-concept and has little confidence in her judgement. She is seeking to reduce symptoms of anxiety and self-doubt and to re-define satisfa ction moving forward. Therapy will focus on cognitive restructuring to redefine her experiences and focus on those satisfying aspects of her life. Will target this goal completion for April1, 2021 (completed). She is also wanting to utilize counseling to adjust and accept her latest diagnosis  of  cancer. She wants assistance to better manage this news and remain positive throughout treatment. Goal date is 12-22. Patient has made progress, but her medical condition is variable and she requests additional treatment to manage moods throughout her medical ordeal. In addition, she is trying to manage feelings associated with her older son's mental health struggles. Revised goal date is 12-24.  Patient agrees to have a video Public affairs consultant) session and is aware of the limitations of this platform. She is at home and I am in office.   [name] went to the hospital by ambulance on Saturday because her blood pressure was so high (190/111). She felt she was close to an MI or a stroke. Was treated in the hospital and was released Monday afternoon. Sodium was extremely low and she was in a very dangerous situation. She is starting to journal her thoughts and feelings. She feels like she has had 9 lives after surviving 13 surgeries. She fears she will never get back to where she was. Working on trying to get to her new normal.                                                                                                                                                                                  Amyri Frenz Grant Time: 3:15p-4:00p 45 minutes

## 2022-11-25 ENCOUNTER — Ambulatory Visit: Payer: 59

## 2022-11-25 ENCOUNTER — Telehealth: Payer: Self-pay | Admitting: Internal Medicine

## 2022-11-25 DIAGNOSIS — R296 Repeated falls: Secondary | ICD-10-CM

## 2022-11-25 DIAGNOSIS — G8929 Other chronic pain: Secondary | ICD-10-CM

## 2022-11-25 DIAGNOSIS — C55 Malignant neoplasm of uterus, part unspecified: Secondary | ICD-10-CM

## 2022-11-25 NOTE — Telephone Encounter (Signed)
Patient was recently d/c from [facility] on 7/8 and feels that she needs a referral for skilled nursing in the home because she is having trouble taking care of herself and taking her medications.  Patient also states that she is a DNR and would also like a referral for Hospice.  Patient has an appointment on Tuesday (7/16) but - she is having cognitive issues and is not feeling well - she is concerned that she will not be able to care for herself until Tuesday.  Call back number [phone]

## 2022-11-26 NOTE — Telephone Encounter (Signed)
I'm sorry she had to go to the hospital. I'll refer to Home Care. What Agency would she like to use? I think Hospice ref is not appropriate Thx

## 2022-11-26 NOTE — Telephone Encounter (Signed)
Called pt gave her MD response. Pt states the home care is [name] @ [phone]...Raechel Chute

## 2022-11-29 ENCOUNTER — Ambulatory Visit (INDEPENDENT_AMBULATORY_CARE_PROVIDER_SITE_OTHER): Payer: 59 | Admitting: Psychology

## 2022-11-29 DIAGNOSIS — F4321 Adjustment disorder with depressed mood: Secondary | ICD-10-CM

## 2022-11-29 DIAGNOSIS — F4323 Adjustment disorder with mixed anxiety and depressed mood: Secondary | ICD-10-CM

## 2022-11-29 NOTE — Progress Notes (Signed)
[name] Time: 4:10p-5:00 p50 minutes   [name] is a 72 y.o. female patient  Date: 11/29/2022  Treatment Plan Diagnosis F43.21 (Adjustment Disorder, With depressed mood) [n/a]  Symptoms A diagnosis of an acute, serious illness that is life-threatening. (Status: maintained) -- No Description Entered  Diminished interest in or enjoyment of activities. (Status: maintained) -- No Description Entered  Lack of energy. (Status: maintained) -- No Description Entered  Sad affect, social withdrawal, anxiety, loss of interest in activities, and low energy. (Status: maintained) -- No Description Entered  Medication Status compliance  Safety unspecified If Suicidal or Homicidal State Action Taken: unspecified  Current Risk:  Medications Buspar (Dosage: 30mg  2X/day)  New Medication (Dosage: 250mg  2x/day)  New Medication (Dosage: 250mg  2x/day)  Prozac (Dosage: 40mg  daily)  Remeron (Dosage: 30mg  nightly)  xanax (Dosage: .5mg  2-3X/day)  Objectives Related Problem: Recognize, accept, and cope with feelings of depression. Description: Identify and replace thoughts and beliefs that support depression. Target Date: 2023-04-29 Frequency: Daily Modality: individual Progress: 75%  Related Problem: Recognize, accept, and cope with feelings of depression. Description: Verbalize an understanding of healthy and unhealthy emotions with the intent of increasing the use of healthy emotions to guide actions. Target Date: 2023-04-29 Frequency: Daily Modality: individual Progress: 85%  Related Problem: Recognize, accept, and cope with feelings of depression. Description: Increasingly verbalize hopeful and positive statements regarding self, others, and the future. Target Date: 2022-04-28 Frequency: Daily Modality: individual Progress: 100%  Related Problem: Reduce fear, anxiety, and worry associated with the medical condition. Description: Share with significant others efforts  to adapt successfully to the medical condition/diagnosis. Target Date: 2023-04-29 Frequency: Daily Modality: individual Progress: 85%  Related Problem: Reduce fear, anxiety, and worry associated with the medical condition. Description: Engage in social, productive, and recreational activities that are possible in spite of medical condition. Target Date: 2023-04-29 Frequency: Daily Modality: individual Progress: 90%  Related Problem: Reduce fear, anxiety, and worry associated with the medical condition. Description: Identify the coping skills and sources of emotional support that have been beneficial in the past. Target Date: 2023-04-29 Frequency: Daily Modality: individual Progress: 80%  Client Response full compliance  Service Location Location, [address] [place], [place] [address]  Service Code cpt [phone]  Related past to present  Facilitate problem solving  Identify/label emotions  Validate/empathize  Self care activities  Lifestyle change (exercise, nutrition)  Assess/facilitate readiness to change  Relaxation training  Rationally challenge thoughts or beliefs/cognitive restructuring  Comments  Dx: F43.21  Meds: Prozac, [name], patient is struggling to rebuild her life and adjust to her new circumstances. She suffers a negative self-concept and has little confidence in her judgement. She is seeking to reduce symptoms of anxiety and self-doubt and to re-define satisfa ction moving forward. Therapy will focus on cognitive restructuring to redefine her experiences and focus on those satisfying aspects of her life. Will target this goal completion for April1, 2021 (completed). She is also wanting to utilize counseling to adjust and accept her latest diagnosis of cancer. She wants assistance to better manage this news and remain positive throughout treatment. Goal date is 12-22. Patient has made progress, but  her medical condition is variable and she requests additional treatment to manage moods throughout her medical ordeal. In addition, she is trying to manage feelings associated with her older son's mental health struggles. Revised goal date is 12-24.  Patient agrees to have a video Public affairs consultant) session  and is aware of the limitations of this platform. She is at home and I am in office.   [name] talked about her current medical status. She is taking 30 different medicines and has numerous conditions. She is trying to remain positive but feels chronically bad. She has been seeking comfort from her reading and various movies/shows. "American Symphony" is a story that has given her inspiration. These have served to provide a significant distraction from the many things in her life that have been distressing. She is working to comply with all medical advice and trying to be hopeful. She is often frustrated and it is a challenge to be patient. In addition her spiritual life provides her with both hope and comfort.                                                                                                                                                                                     [name] Time: 4:15p-5:00p 45 minutes

## 2022-11-30 ENCOUNTER — Ambulatory Visit (INDEPENDENT_AMBULATORY_CARE_PROVIDER_SITE_OTHER): Payer: 59 | Admitting: Internal Medicine

## 2022-11-30 ENCOUNTER — Encounter: Payer: Self-pay | Admitting: Internal Medicine

## 2022-11-30 VITALS — BP 120/62 | HR 76 | Temp 98.2°F | Ht 61.81 in | Wt 159.0 lb

## 2022-11-30 DIAGNOSIS — E871 Hypo-osmolality and hyponatremia: Secondary | ICD-10-CM | POA: Diagnosis not present

## 2022-11-30 DIAGNOSIS — C55 Malignant neoplasm of uterus, part unspecified: Secondary | ICD-10-CM

## 2022-11-30 DIAGNOSIS — N1831 Chronic kidney disease, stage 3a: Secondary | ICD-10-CM

## 2022-11-30 DIAGNOSIS — R296 Repeated falls: Secondary | ICD-10-CM

## 2022-11-30 DIAGNOSIS — R270 Ataxia, unspecified: Secondary | ICD-10-CM | POA: Diagnosis not present

## 2022-11-30 LAB — COMPREHENSIVE METABOLIC PANEL
ALT: 17 U/L (ref 0–35)
AST: 20 U/L (ref 0–37)
Albumin: 4.2 g/dL (ref 3.5–5.2)
Alkaline Phosphatase: 102 U/L (ref 39–117)
BUN: 17 mg/dL (ref 6–23)
CO2: 27 mEq/L (ref 19–32)
Calcium: 9.3 mg/dL (ref 8.4–10.5)
Chloride: 99 mEq/L (ref 96–112)
Creatinine, Ser: 1.11 mg/dL (ref 0.40–1.20)
GFR: 49.53 mL/min — ABNORMAL LOW (ref >60.00)
Glucose, Bld: 86 mg/dL (ref 70–99)
Potassium: 4 mEq/L (ref 3.5–5.1)
Sodium: 131 mEq/L — ABNORMAL LOW (ref 135–145)
Total Bilirubin: 0.4 mg/dL (ref 0.2–1.2)
Total Protein: 6.9 g/dL (ref 6.0–8.3)

## 2022-11-30 LAB — CBC WITH DIFFERENTIAL/PLATELET
Basophils Absolute: 0.1 10*3/uL (ref 0.0–0.1)
Basophils Relative: 1.5 % (ref 0.0–3.0)
Eosinophils Absolute: 0.2 10*3/uL (ref 0.0–0.7)
Eosinophils Relative: 3.7 % (ref 0.0–5.0)
HCT: 34.4 % — ABNORMAL LOW (ref 36.0–46.0)
Hemoglobin: 11.7 g/dL — ABNORMAL LOW (ref 12.0–15.0)
Lymphocytes Relative: 21.2 % (ref 12.0–46.0)
Lymphs Abs: 1.3 10*3/uL (ref 0.7–4.0)
MCHC: 34.1 g/dL (ref 30.0–36.0)
MCV: 90.7 fl (ref 78.0–100.0)
Monocytes Absolute: 0.4 10*3/uL (ref 0.1–1.0)
Monocytes Relative: 6.8 % (ref 3.0–12.0)
Neutro Abs: 4.1 10*3/uL (ref 1.4–7.7)
Neutrophils Relative %: 66.8 % (ref 43.0–77.0)
Platelets: 385 10*3/uL (ref 150.0–400.0)
RBC: 3.79 Mil/uL — ABNORMAL LOW (ref 3.87–5.11)
RDW: 13.4 % (ref 11.5–15.5)
WBC: 6.1 10*3/uL (ref 4.0–10.5)

## 2022-11-30 NOTE — Assessment & Plan Note (Signed)
 F/u w/Dr Berline Lopes

## 2022-11-30 NOTE — Assessment & Plan Note (Signed)
Start In home PT

## 2022-11-30 NOTE — Progress Notes (Signed)
Subjective:  Patient ID: [name], female    DOB: [birth year]  Age: 72 y.o. MRN: [id number]  CC: Medication Refill (Discuss new Medication)   HPI [name] presents for post-hospital f/u     [name] WUJ:[id number] DOB: 21-Jun-1950 DOA: 11/20/2022   PCP: Charlotte Garter, MD   Admit date: 11/20/2022   Discharge date: 11/22/2022   Admitted From:Home   Disposition:  Home   Recommendations for Outpatient Follow-up:  Follow up with PCP in 1-2 weeks Follow-up with cardiology as scheduled for this week Remain off of hydrochlorothiazide and follow-up BMP in 1 week to ensure sodium levels have further improved Remain on Coreg 12.5 mg twice daily Remain on hydralazine 20 mg now 3 times daily Remain on other home medications as prior   Home Health: None   Equipment/Devices: None   Discharge Condition:Stable   CODE STATUS: Full   Diet recommendation: Heart Healthy   Brief/Interim Summary:   [name] is a 72 y.o. 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  She was also noted to have uncontrolled hypertension at home and had medication adjustments as noted above.  She received some IV normal saline with improvement in sodium levels and has been instructed to remain off of hydrochlorothiazide as this appears to be primarily contributing to her hyponatremia.  Her blood pressures have remained well-controlled on medication adjustments as noted and she will have close follow-up arranged.  No other acute events or concerns noted and she is stable for discharge.   Discharge Diagnoses:  Principal Problem:   Hyponatremia Active Problems:   Hyperlipidemia   Hypothyroidism   HTN (hypertension)   Chronic kidney disease, stage 3a (HCC)   Adjustment disorder with  mixed anxiety and depressed mood   Principal discharge diagnosis: Uncontrolled hypertension along with hyponatremia and likely related to poor solute intake as well as HCTZ use.   Discharge Instructions   Discharge Instructions       Diet - low sodium heart healthy   Complete by: As directed      Increase activity slowly   Complete by: As directed           "  Outpatient Medications Prior to Visit  Medication Sig Dispense Refill   acetaminophen (TYLENOL) 325 MG tablet Take 2 tablets (650 mg total) by mouth every 6 (six) hours as needed for moderate pain. 90 tablet 2   ALPRAZolam (XANAX) 1 MG tablet Take 1 tablet (1 mg total) by mouth 3 (three) times daily as needed for anxiety. 90 tablet 5   aspirin EC 81 MG tablet Take 1 tablet (81 mg total) by mouth daily. 30 tablet 3   busPIRone (BUSPAR) 30 MG tablet TAKE 1 TABLET BY MOUTH TWICE  DAILY 200 tablet 1   carvedilol (COREG) 12.5 MG tablet Take 1 tablet (12.5 mg total) by mouth 2 (two) times daily with a meal. 60 tablet 0   Cholecalciferol (D3 5000) 125 MCG (5000 UT) capsule Take 5,000 Units by mouth daily.     D-Mannose 500 MG CAPS Take 500 mg by mouth 2 (two) times daily. WITH CRANBERRY & DANDELION EXTRACT 180 capsule 3   diphenoxylate-atropine (LOMOTIL) 2.5-0.025 MG tablet TAKE 1 OR 2 TABLETS BY MOUTH 4 TIMES A DAY AS NEEDED FOR DIARRHEA/LOSSE STOOLS 60 tablet 1   esomeprazole (NEXIUM) 40 MG capsule TAKE 1 CAPSULE (  40 MG TOTAL) BY MOUTH EVERY MORNING. 90 capsule 1   FLUoxetine (PROZAC) 40 MG capsule TAKE 1 CAPSULE BY MOUTH IN THE  MORNING 90 capsule 3   hydrALAZINE (APRESOLINE) 10 MG tablet Take 2 tablets (20 mg total) by mouth 3 (three) times daily. 180 tablet 0   ipratropium-albuterol (DUONEB) 0.5-2.5 (3) MG/3ML SOLN Take 3 mLs by nebulization every 6 (six) hours as needed (Asthma).     iron polysaccharides (NIFEREX) 150 MG capsule Take 1 capsule (150 mg total) by mouth daily. 90 capsule 1   levothyroxine (SYNTHROID) 100 MCG tablet  TAKE 1 TABLET BY MOUTH DAILY BEFORE BREAKFAST. 90 tablet 3   loperamide (IMODIUM A-D) 2 MG tablet Take 2 mg by mouth 4 (four) times daily as needed for diarrhea or loose stools.     losartan (COZAAR) 50 MG tablet Take 1 tablet (50 mg total) by mouth daily. 90 tablet 3   METAMUCIL FIBER PO Take 1 capsule by mouth in the morning and at bedtime.     mirtazapine (REMERON) 30 MG tablet TAKE 1 TABLET BY MOUTH AT  BEDTIME 90 tablet 3   omega-3 acid ethyl esters (LOVAZA) 1 g capsule TAKE 2 CAPSULES BY MOUTH 2 TIMES DAILY. 360 capsule 1   ondansetron (ZOFRAN-ODT) 4 MG disintegrating tablet TAKE 1 TABLET BY MOUTH EVERY 8 HOURS AS NEEDED FOR NAUSEA AND VOMITING     OVER THE COUNTER MEDICATION Take 1 capsule by mouth daily. 7 MUSHROOM BLEND     Polyethyl Glycol-Propyl Glycol (SYSTANE OP) Place 1 drop into both eyes 2 (two) times daily as needed (dry eyes).      rosuvastatin (CRESTOR) 10 MG tablet Take 1 tablet (10 mg total) by mouth daily. 90 tablet 3   traMADol (ULTRAM) 50 MG tablet Take 1 tablet (50 mg total) by mouth every 8 (eight) hours as needed for moderate pain. 90 tablet 1   vitamin B-12 (CYANOCOBALAMIN) 1000 MCG tablet Take 1,000 mcg by mouth daily.     vitamin C (ASCORBIC ACID) 500 MG tablet Take 500 mg by mouth daily.     colesevelam (WELCHOL) 625 MG tablet Take 2 tablets (1,250 mg total) by mouth 2 (two) times daily with a meal. 360 tablet 3   carvedilol (COREG) 6.25 MG tablet Take 1 tablet (6.25 mg total) by mouth 2 (two) times daily. (Patient not taking: Reported on 11/23/2022) 180 tablet 3   methylphenidate (RITALIN) 10 MG tablet Take 1 tablet (10 mg total) by mouth in the morning. 90 tablet 0   No facility-administered medications prior to visit.    ROS: Review of Systems  Constitutional:  Positive for fatigue. Negative for activity change, appetite change, chills and unexpected weight change.  HENT:  Negative for congestion, mouth sores and sinus pressure.   Eyes:  Negative for visual  disturbance.  Respiratory:  Negative for cough and chest tightness.   Gastrointestinal:  Negative for abdominal pain and nausea.  Genitourinary:  Negative for difficulty urinating, frequency and vaginal pain.  Musculoskeletal:  Positive for arthralgias, back pain and gait problem.  Skin:  Negative for pallor and rash.  Neurological:  Positive for weakness. Negative for dizziness, tremors, numbness and headaches.  Psychiatric/Behavioral:  Negative for confusion and sleep disturbance.     Objective:  BP 120/62 (BP Location: Left Arm, Patient Position: Sitting, Cuff Size: Large)   Pulse 76   Temp 98.2 F (36.8 C) (Oral)   Ht 5' 1.81" (1.57 m)   Wt 159 lb (72.1 kg)  SpO2 96%   BMI 29.26 kg/m   BP Readings from Last 3 Encounters:  11/30/22 120/62  11/22/22 119/74  10/29/22 (!) 142/90    Wt Readings from Last 3 Encounters:  11/30/22 159 lb (72.1 kg)  11/22/22 160 lb (72.6 kg)  10/22/22 159 lb (72.1 kg)    Physical Exam Constitutional:      General: She is not in acute distress.    Appearance: She is well-developed. She is obese.  HENT:     Head: Normocephalic.     Right Ear: External ear normal.     Left Ear: External ear normal.     Nose: Nose normal.  Eyes:     General:        Right eye: No discharge.        Left eye: No discharge.     Conjunctiva/sclera: Conjunctivae normal.     Pupils: Pupils are equal, round, and reactive to light.  Neck:     Thyroid: No thyromegaly.     Vascular: No JVD.     Trachea: No tracheal deviation.  Cardiovascular:     Rate and Rhythm: Normal rate and regular rhythm.     Heart sounds: Normal heart sounds.  Pulmonary:     Effort: No respiratory distress.     Breath sounds: No stridor. No wheezing.  Abdominal:     General: Bowel sounds are normal. There is no distension.     Palpations: Abdomen is soft. There is no mass.     Tenderness: There is no abdominal tenderness. There is no guarding or rebound.  Musculoskeletal:         General: No tenderness.     Cervical back: Normal range of motion and neck supple. No rigidity.  Lymphadenopathy:     Cervical: No cervical adenopathy.  Skin:    Findings: No erythema or rash.  Neurological:     Mental Status: She is oriented to person, place, and time.     Cranial Nerves: No cranial nerve deficit.     Motor: No abnormal muscle tone.     Coordination: Coordination normal.     Deep Tendon Reflexes: Reflexes normal.  Psychiatric:        Behavior: Behavior normal.        Thought Content: Thought content normal.        Judgment: Judgment normal.   In a w/c  Lab Results  Component Value Date   WBC 6.1 11/30/2022   HGB 11.7 (L) 11/30/2022   HCT 34.4 (L) 11/30/2022   PLT 385.0 11/30/2022   GLUCOSE 86 11/30/2022   CHOL 277 (H) 09/23/2022   TRIG 276 (H) 09/23/2022   HDL 51 09/23/2022   LDLDIRECT 160.0 10/18/2018   LDLCALC 174 (H) 09/23/2022   ALT 17 11/30/2022   AST 20 11/30/2022   NA 131 (L) 11/30/2022   K 4.0 11/30/2022   CL 99 11/30/2022   CREATININE 1.11 11/30/2022   BUN 17 11/30/2022   CO2 27 11/30/2022   TSH 5.064 (H) 11/21/2022   INR 1.0 07/15/2020   HGBA1C 5.8 03/01/2022   MICROALBUR 0.8 12/16/2021    DG Chest 1 View  Result Date: 11/20/2022 CLINICAL DATA:  Chest pain. EXAM: CHEST  1 VIEW COMPARISON:  Radiograph 09/09/2022.  CT 07/23/2022 FINDINGS: The cardiomediastinal contours are normal. Scarring and chain sutures in the right upper lung zone. Minimal streaky left lung base opacity pulmonary vasculature is normal. No pleural effusion, or pneumothorax. No acute osseous abnormalities are seen. Lower  cervical spine hardware. IMPRESSION: Minimal streaky left lung base opacity, favor atelectasis. Electronically Signed   By: Narda Rutherford M.D.   On: 11/20/2022 18:00    Assessment & Plan:   Problem List Items Addressed This Visit     Leiomyosarcoma of uterus (HCC) - Primary    F/u w/Dr [name]      Relevant Orders   CBC with  Differential/Platelet (Completed)   Chronic kidney disease, stage 3a (HCC)    Check CMET      Ataxia    Start In home PT      Relevant Orders   Ambulatory referral to Home Health   Falls frequently    Start In home PT      Relevant Orders   CBC with Differential/Platelet (Completed)   Comprehensive metabolic panel (Completed)   Ambulatory referral to Home Health   Hyponatremia    Check CMET today      Relevant Orders   Comprehensive metabolic panel (Completed)      No orders of the defined types were placed in this encounter.     Follow-up: Return in about 6 weeks (around 01/11/2023) for a follow-up visit.  Sonda Primes, MD

## 2022-11-30 NOTE — Assessment & Plan Note (Signed)
Check CMET today. 

## 2022-11-30 NOTE — Assessment & Plan Note (Signed)
 Check CMET. 

## 2022-12-01 NOTE — Telephone Encounter (Signed)
    Primary Cardiologist:Kardie Tobb, DO  Chart reviewed as part of pre-operative protocol coverage. Because of [name] past medical history and time since last visit, he/she will require a follow-up Office visit in order to better assess preoperative cardiovascular risk. Recently admitted with hyponatremia and hypertension.   Pre-op covering staff: - Please schedule appointment and call patient to inform them. - Please contact requesting surgeon's office via preferred method (i.e, phone, fax) to inform them of need for appointment prior to surgery.  If applicable, this message will also be routed to pharmacy pool and/or primary cardiologist for input on holding anticoagulant/antiplatelet agent as requested below so that this information is available at time of patient's appointment.   Rip Harbour, NP  12/01/2022, 9:20 AM

## 2022-12-01 NOTE — Telephone Encounter (Signed)
Attempted to contact the patient to schedule IN OFFICE appt for cardiac clearance. LVM for a call back.

## 2022-12-02 ENCOUNTER — Telehealth: Payer: Self-pay | Admitting: Internal Medicine

## 2022-12-02 ENCOUNTER — Ambulatory Visit: Payer: 59 | Admitting: Internal Medicine

## 2022-12-02 NOTE — Telephone Encounter (Signed)
Spoke with the patient who is agreeable with an in office appt. The patient requested an appointment in September on a Tuesday or Thursday afternoon. Follow up appointment scheduled with Joni Reining I on 02/01/23. Patient agreeable and voiced understanding.

## 2022-12-02 NOTE — Telephone Encounter (Signed)
Prescription Request  12/02/2022  LOV: 11/30/2022  What is the name of the medication or equipment? ALPRAZolam (XANAX) 1 MG tablet  vitamin B-12 (CYANOCOBALAMIN) 1000 MCG tablet  aspirin EC 81 MG tablet  METAMUCIL FIBER PO  omega-3 acid ethyl esters (LOVAZA) 1 g capsule  loperamide (IMODIUM A-D) 2 MG tablet  ondansetron (ZOFRAN-ODT) 4 MG disintegrating tablet  traMADol (ULTRAM) 50 MG tablet  acetaminophen (TYLENOL) 325 MG tablet  vitamin C (ASCORBIC ACID) 500 MG tablet  diphenoxylate-atropine (LOMOTIL) 2.5-0.025 MG tablet  colesevelam (WELCHOL) 625 MG tablet  Cholecalciferol (D3 5000) 125 MCG (5000 UT) capsule   Have you contacted your pharmacy to request a refill? No   Which pharmacy would you  [facility], [address]. [place], [place] [address]. [phone] Like this sent to?     Patient notified that their request is being sent to the clinical staff for review and that they should receive a response within 2 business days.   Please advise at Mobile [phone] (mobile)

## 2022-12-02 NOTE — Telephone Encounter (Signed)
Place form in MD purple folder to complete.Marland KitchenJohny Grant

## 2022-12-02 NOTE — Telephone Encounter (Signed)
Surgical clearance received from [facility] & placed in provider's box up front. Please fax to [phone] when complete.

## 2022-12-02 NOTE — Telephone Encounter (Signed)
Called pt to make sure she was switching pharmacy. She states after talking w/ Dr. Posey Rea she is going to continue using [facility].../lmb

## 2022-12-03 ENCOUNTER — Other Ambulatory Visit: Payer: Self-pay | Admitting: Internal Medicine

## 2022-12-03 ENCOUNTER — Telehealth: Payer: Self-pay | Admitting: Internal Medicine

## 2022-12-03 ENCOUNTER — Other Ambulatory Visit: Payer: Self-pay | Admitting: Cardiology

## 2022-12-03 DIAGNOSIS — M797 Fibromyalgia: Secondary | ICD-10-CM | POA: Diagnosis not present

## 2022-12-03 DIAGNOSIS — I129 Hypertensive chronic kidney disease with stage 1 through stage 4 chronic kidney disease, or unspecified chronic kidney disease: Secondary | ICD-10-CM | POA: Diagnosis not present

## 2022-12-03 DIAGNOSIS — M47812 Spondylosis without myelopathy or radiculopathy, cervical region: Secondary | ICD-10-CM | POA: Diagnosis not present

## 2022-12-03 DIAGNOSIS — N200 Calculus of kidney: Secondary | ICD-10-CM | POA: Diagnosis not present

## 2022-12-03 DIAGNOSIS — K589 Irritable bowel syndrome without diarrhea: Secondary | ICD-10-CM | POA: Diagnosis not present

## 2022-12-03 DIAGNOSIS — Z96641 Presence of right artificial hip joint: Secondary | ICD-10-CM | POA: Diagnosis not present

## 2022-12-03 DIAGNOSIS — D509 Iron deficiency anemia, unspecified: Secondary | ICD-10-CM | POA: Diagnosis not present

## 2022-12-03 DIAGNOSIS — E559 Vitamin D deficiency, unspecified: Secondary | ICD-10-CM | POA: Diagnosis not present

## 2022-12-03 DIAGNOSIS — E871 Hypo-osmolality and hyponatremia: Secondary | ICD-10-CM | POA: Diagnosis not present

## 2022-12-03 DIAGNOSIS — E213 Hyperparathyroidism, unspecified: Secondary | ICD-10-CM | POA: Diagnosis not present

## 2022-12-03 DIAGNOSIS — J45909 Unspecified asthma, uncomplicated: Secondary | ICD-10-CM | POA: Diagnosis not present

## 2022-12-03 DIAGNOSIS — D631 Anemia in chronic kidney disease: Secondary | ICD-10-CM | POA: Diagnosis not present

## 2022-12-03 DIAGNOSIS — M5030 Other cervical disc degeneration, unspecified cervical region: Secondary | ICD-10-CM | POA: Diagnosis not present

## 2022-12-03 DIAGNOSIS — G43909 Migraine, unspecified, not intractable, without status migrainosus: Secondary | ICD-10-CM | POA: Diagnosis not present

## 2022-12-03 DIAGNOSIS — G8929 Other chronic pain: Secondary | ICD-10-CM | POA: Diagnosis not present

## 2022-12-03 DIAGNOSIS — D630 Anemia in neoplastic disease: Secondary | ICD-10-CM | POA: Diagnosis not present

## 2022-12-03 DIAGNOSIS — N1831 Chronic kidney disease, stage 3a: Secondary | ICD-10-CM | POA: Diagnosis not present

## 2022-12-03 DIAGNOSIS — Z7982 Long term (current) use of aspirin: Secondary | ICD-10-CM | POA: Diagnosis not present

## 2022-12-03 DIAGNOSIS — H04123 Dry eye syndrome of bilateral lacrimal glands: Secondary | ICD-10-CM | POA: Diagnosis not present

## 2022-12-03 DIAGNOSIS — I10 Essential (primary) hypertension: Secondary | ICD-10-CM

## 2022-12-03 DIAGNOSIS — R911 Solitary pulmonary nodule: Secondary | ICD-10-CM | POA: Diagnosis not present

## 2022-12-03 DIAGNOSIS — I700 Atherosclerosis of aorta: Secondary | ICD-10-CM | POA: Diagnosis not present

## 2022-12-03 MED ORDER — MIRTAZAPINE 30 MG PO TABS: 30.0000 mg | ORAL_TABLET | Freq: Every day | ORAL | 0 refills | Status: DC

## 2022-12-03 NOTE — Telephone Encounter (Signed)
Called Jim no answer LMOM ok for verbal. Please callback to go over medicines.Marland KitchenRaechel Chute

## 2022-12-03 NOTE — Telephone Encounter (Signed)
[name] called back we went over medications that's on her d/c summary. He states in the home pt has 45mg  of Remeron. Inform Jim per med list she is suppose to be taking Remeron 30mg  will send rx to pharmacy until her appt in August../lmb

## 2022-12-03 NOTE — Telephone Encounter (Signed)
Rosanne Ashing bayada called after doing an evaluation on the pt today and would like verbal orders for PT to improve functional mobility and fall prevention with a frequency of:  1X for 1 week  &  2X for 4 weeks  Pt's BP was elevated today 173/100 resting and 159/100 standing. Pt has already taken her meds.   Pt had discharge PW that had some medication discrepencies. Rosanne Ashing would like Clarification on the following:  Pt has an RX for Remeron 45mg , PW has it listed as 30mg . Pt does not have any methylphenidate PW listed Iron 150mg  taking pt is taking 65mg  x2 a day Is the pt supposed to take carvedilol 12.5 2x a day?   Please call Rosanne Ashing to discuss  [phone]

## 2022-12-05 ENCOUNTER — Other Ambulatory Visit: Payer: Self-pay | Admitting: Internal Medicine

## 2022-12-06 NOTE — Telephone Encounter (Signed)
[name] also found a level 2 interaction between these medications:  Labetalol & ipratropium-albuterol (DUONEB)

## 2022-12-07 ENCOUNTER — Telehealth: Payer: Self-pay | Admitting: Cardiology

## 2022-12-07 DIAGNOSIS — D509 Iron deficiency anemia, unspecified: Secondary | ICD-10-CM | POA: Diagnosis not present

## 2022-12-07 DIAGNOSIS — H04123 Dry eye syndrome of bilateral lacrimal glands: Secondary | ICD-10-CM | POA: Diagnosis not present

## 2022-12-07 DIAGNOSIS — N200 Calculus of kidney: Secondary | ICD-10-CM | POA: Diagnosis not present

## 2022-12-07 DIAGNOSIS — N1831 Chronic kidney disease, stage 3a: Secondary | ICD-10-CM | POA: Diagnosis not present

## 2022-12-07 DIAGNOSIS — G8929 Other chronic pain: Secondary | ICD-10-CM | POA: Diagnosis not present

## 2022-12-07 DIAGNOSIS — Z7982 Long term (current) use of aspirin: Secondary | ICD-10-CM | POA: Diagnosis not present

## 2022-12-07 DIAGNOSIS — I129 Hypertensive chronic kidney disease with stage 1 through stage 4 chronic kidney disease, or unspecified chronic kidney disease: Secondary | ICD-10-CM | POA: Diagnosis not present

## 2022-12-07 DIAGNOSIS — M797 Fibromyalgia: Secondary | ICD-10-CM | POA: Diagnosis not present

## 2022-12-07 DIAGNOSIS — M5030 Other cervical disc degeneration, unspecified cervical region: Secondary | ICD-10-CM | POA: Diagnosis not present

## 2022-12-07 DIAGNOSIS — E871 Hypo-osmolality and hyponatremia: Secondary | ICD-10-CM | POA: Diagnosis not present

## 2022-12-07 DIAGNOSIS — E559 Vitamin D deficiency, unspecified: Secondary | ICD-10-CM | POA: Diagnosis not present

## 2022-12-07 DIAGNOSIS — Z96641 Presence of right artificial hip joint: Secondary | ICD-10-CM | POA: Diagnosis not present

## 2022-12-07 DIAGNOSIS — E213 Hyperparathyroidism, unspecified: Secondary | ICD-10-CM | POA: Diagnosis not present

## 2022-12-07 DIAGNOSIS — D630 Anemia in neoplastic disease: Secondary | ICD-10-CM | POA: Diagnosis not present

## 2022-12-07 DIAGNOSIS — K589 Irritable bowel syndrome without diarrhea: Secondary | ICD-10-CM | POA: Diagnosis not present

## 2022-12-07 DIAGNOSIS — G43909 Migraine, unspecified, not intractable, without status migrainosus: Secondary | ICD-10-CM | POA: Diagnosis not present

## 2022-12-07 DIAGNOSIS — J45909 Unspecified asthma, uncomplicated: Secondary | ICD-10-CM | POA: Diagnosis not present

## 2022-12-07 DIAGNOSIS — I700 Atherosclerosis of aorta: Secondary | ICD-10-CM | POA: Diagnosis not present

## 2022-12-07 DIAGNOSIS — R911 Solitary pulmonary nodule: Secondary | ICD-10-CM | POA: Diagnosis not present

## 2022-12-07 DIAGNOSIS — D631 Anemia in chronic kidney disease: Secondary | ICD-10-CM | POA: Diagnosis not present

## 2022-12-07 DIAGNOSIS — M47812 Spondylosis without myelopathy or radiculopathy, cervical region: Secondary | ICD-10-CM | POA: Diagnosis not present

## 2022-12-07 NOTE — Telephone Encounter (Signed)
Okay.  Thanks.

## 2022-12-07 NOTE — Telephone Encounter (Signed)
Spoke to the patient, she reported to [facility] health that one day last week her blood pressure was 173/100 .Pt stated she did rechecked her bp, however she is confused. Pt stated HH did check her bp today 190/120.   Patient stated she is experiencing some confusion and disorientation , while on the phone stated she may had a mini stroke. Pt stated she has a family his of stroke. Pt gave verbal permission to call 911, stayed on the phone with the patient until EMS arrived . Will forward to MD and nurse .

## 2022-12-07 NOTE — Telephone Encounter (Signed)
EMS is at patient home, called to say patient is negative for  stroke level was and her bp has come back down. Please advise

## 2022-12-07 NOTE — Telephone Encounter (Signed)
Pt c/o BP issue: STAT if pt c/o blurred vision, one-sided weakness or slurred speech  1. What are your last 5 BP readings? 190/120 - Today        173/100 - Last Week  2. Are you having any other symptoms (ex. Dizziness, headache, blurred vision, passed out)? Pt Asymptomatic  3. What is your BP issue? Pt's Home Health nurse states that pt's BP has been elevated since last week. He would also like to make provider aware that pt was hospitalized. Nurse states if any questions call him otherwise call pt regarding this matter. Please advise

## 2022-12-08 ENCOUNTER — Telehealth: Payer: Self-pay | Admitting: Internal Medicine

## 2022-12-08 ENCOUNTER — Other Ambulatory Visit: Payer: Self-pay

## 2022-12-08 ENCOUNTER — Ambulatory Visit (INDEPENDENT_AMBULATORY_CARE_PROVIDER_SITE_OTHER): Payer: 59 | Admitting: Psychology

## 2022-12-08 DIAGNOSIS — F4323 Adjustment disorder with mixed anxiety and depressed mood: Secondary | ICD-10-CM

## 2022-12-08 DIAGNOSIS — Z79899 Other long term (current) drug therapy: Secondary | ICD-10-CM

## 2022-12-08 DIAGNOSIS — F4321 Adjustment disorder with depressed mood: Secondary | ICD-10-CM | POA: Diagnosis not present

## 2022-12-08 DIAGNOSIS — I10 Essential (primary) hypertension: Secondary | ICD-10-CM

## 2022-12-08 MED ORDER — CLONIDINE HCL 0.2 MG PO TABS
0.2000 mg | ORAL_TABLET | Freq: Every day | ORAL | 2 refills | Status: DC
Start: 2022-12-08 — End: 2023-01-11

## 2022-12-08 NOTE — Telephone Encounter (Signed)
Pt called wanting Dr. Macario Golds to send a referral for a nurse so she can get some help with taking all her medications. Please advise. Pt really need help.  [name] [phone]

## 2022-12-08 NOTE — Progress Notes (Signed)
[name] Time: 4:10p-5:00 p50 minutes   [name] is a 72 y.o. female patient  Date: 12/08/2022  Treatment Plan Diagnosis F43.21 (Adjustment Disorder, With depressed mood) [n/a]  Symptoms A diagnosis of an acute, serious illness that is life-threatening. (Status: maintained) -- No Description Entered  Diminished interest in or enjoyment of activities. (Status: maintained) -- No Description Entered  Lack of energy. (Status: maintained) -- No Description Entered  Sad affect, social withdrawal, anxiety, loss of interest in activities, and low energy. (Status: maintained) -- No Description Entered  Medication Status compliance  Safety unspecified If Suicidal or Homicidal State Action Taken: unspecified  Current Risk:  Medications Buspar (Dosage: 30mg  2X/day)  New Medication (Dosage: 250mg  2x/day)  New Medication (Dosage: 250mg  2x/day)  Prozac (Dosage: 40mg  daily)  Remeron (Dosage: 30mg  nightly)  xanax (Dosage: .5mg  2-3X/day)  Objectives Related Problem: Recognize, accept, and cope with feelings of depression. Description: Identify and replace thoughts and beliefs that support depression. Target Date: 2023-04-29 Frequency: Daily Modality: individual Progress: 75%  Related Problem: Recognize, accept, and cope with feelings of depression. Description: Verbalize an understanding of healthy and unhealthy emotions with the intent of increasing the use of healthy emotions to guide actions. Target Date: 2023-04-29 Frequency: Daily Modality: individual Progress: 85%  Related Problem: Recognize, accept, and cope with feelings of depression. Description: Increasingly verbalize hopeful and positive statements regarding self, others, and the future. Target Date: 2022-04-28 Frequency: Daily Modality: individual Progress: 100%  Related Problem: Reduce fear, anxiety, and worry associated with the medical condition. Description: Share  with significant others efforts to adapt successfully to the medical condition/diagnosis. Target Date: 2023-04-29 Frequency: Daily Modality: individual Progress: 85%  Related Problem: Reduce fear, anxiety, and worry associated with the medical condition. Description: Engage in social, productive, and recreational activities that are possible in spite of medical condition. Target Date: 2023-04-29 Frequency: Daily Modality: individual Progress: 90%  Related Problem: Reduce fear, anxiety, and worry associated with the medical condition. Description: Identify the coping skills and sources of emotional support that have been beneficial in the past. Target Date: 2023-04-29 Frequency: Daily Modality: individual Progress: 80%  Client Response full compliance  Service Location Location, [address] [place], [place] [address]  Service Code cpt [phone]  Related past to present  Facilitate problem solving  Identify/label emotions  Validate/empathize  Self care activities  Lifestyle change (exercise, nutrition)  Assess/facilitate readiness to change  Relaxation training  Rationally challenge thoughts or beliefs/cognitive restructuring  Comments  Dx: F43.21  Meds: Prozac, [name], patient is struggling to rebuild her life and adjust to her new circumstances. She suffers a negative self-concept and has little confidence in her judgement. She is seeking to reduce symptoms of anxiety and self-doubt and to re-define satisfa ction moving forward. Therapy will focus on cognitive restructuring to redefine her experiences and focus on those satisfying aspects of her life. Will target this goal completion for April1, 2021 (completed). She is also wanting to utilize counseling to adjust and accept her latest diagnosis of cancer. She wants assistance to better manage this news and remain positive throughout treatment. Goal date is 12-22.  Patient has made progress, but her medical condition is variable and she requests additional treatment to manage moods throughout her medical ordeal. In addition, she is trying to manage feelings associated with her older son's mental health  struggles. Revised goal date is 12-24.  Patient agrees to have a video Public affairs consultant) session and is aware of the limitations of this platform. She is at home and I am in office.   [name]  says that she had to call ambulance again yesterday because her blood pressure was so high. She reported in detail her last hospitalization and had difficulty staying on track. She said her blood pressure went to 190/120. The paramedics stayed with her for an hour until her blood pressure went down. She did not have to go to the hospital. She is no longer allowed to drive. She talked about not feeling afraid to die and she has a very positive perspective. Reports that there are many things for which she is grateful, and tries to avoid focusing on the negative. We talked about how she can maintain a positive attitude moving forward. Moods are more stable and positive.                                                                                                                                                                                        [name] Time: 3:15p-4:00p 45 minutes

## 2022-12-08 NOTE — Telephone Encounter (Signed)
Left voicemail message for the patient to call the clinic. 

## 2022-12-08 NOTE — Telephone Encounter (Signed)
Okay. Thank you.

## 2022-12-09 ENCOUNTER — Ambulatory Visit: Payer: Self-pay

## 2022-12-09 DIAGNOSIS — E871 Hypo-osmolality and hyponatremia: Secondary | ICD-10-CM

## 2022-12-09 NOTE — Patient Outreach (Signed)
  Care Coordination   Initial Visit Note   12/09/2022 Name: [name] MRN: [id number] DOB: 1950-08-31  [name] is a 72 y.o. year old female who sees Plotnikov, Georgina Quint, MD for primary care. I spoke with  [name] by phone today.  What matters to the patients health and wellness today?  Hospitalized 11/20/22-11/23/22 with hyponatremia. [name] reports she gets confused with managing medications. She reports recently HHPT called 911 due to increase BP. She reports she has thirty-one medications to take daily. She reports she has all her medications and is requesting some assistance with managing medications.  Goals Addressed             This Visit's Progress    Assist with Health managment       Interventions Today    Flowsheet Row Most Recent Value  Chronic Disease   Chronic disease during today's visit Other  [hyponatremia, uncontrolled HTN/ anxiety]  General Interventions   General Interventions Discussed/Reviewed General Interventions Discussed, Communication with  Doctor Visits Discussed/Reviewed Doctor Visits Discussed  PCP/Specialist Visits Compliance with follow-up visit  [reviewed upcoming appointments]  Communication with PCP/Specialists, Pharmacists, Social Work  Surveyor, minerals for [facility] Nurse to assist with medication managment due to confusion related to health condition, pharmacy referral to assist with medications management/patient has questions about side effects of crestor,  LCSW re: possible SW assistance if needed]  Exercise Interventions   Exercise Discussed/Reviewed Exercise Discussed  [confirmed active with home health [facility] for physical therapy]  Education Interventions   Education Provided Provided Education  Provided Verbal Education On Medication, Exercise  [advised to contact provider with health questions or concerns as needed. medications reviewed encouraged to take as prescribed and work with therapist as recommended]   Pharmacy Interventions   Pharmacy Dicussed/Reviewed Pharmacy Topics Discussed, Medication Adherence, Referral to Pharmacist  Medication Adherence Not taking medication  [patent reports she gets confused and needs assistance with medication management]  Referral to Pharmacist Drug interaction/side effects  [Patient questions if she is having side effects from rosuvastatin]  Safety Interventions   Safety Discussed/Reviewed Safety Discussed            SDOH assessments and interventions completed:  No  Care Coordination Interventions:  Yes, provided   Follow up plan: Follow up call scheduled for 12/27/22    Encounter Outcome:  Pt. Visit Completed   Kathyrn Sheriff, RN, MSN, BSN, CCM [facility] Care Coordinator [phone]

## 2022-12-09 NOTE — Telephone Encounter (Signed)
Faxed medical clearance back to [facility] @ [phone].Marland KitchenRaechel Chute

## 2022-12-09 NOTE — Telephone Encounter (Signed)
Notified pt referral was placed on 7/19, and they should be calling her..[name]

## 2022-12-09 NOTE — Patient Instructions (Signed)
Visit Information  Thank you for taking time to visit with me today. Please don't hesitate to contact me if I can be of assistance to you.   Following are the goals we discussed today:  Take medications as prescribed Continue to attend provider visits as scheduled Continue to work with home health therapy as recommended Continue to eat healthy Contact [employer] Navigator at [phone] to ensure you are accessing all your insurance benefits available.  Please call the care guide team at [phone] if you need to cancel or reschedule your appointment.   If you are experiencing a Mental Health or Behavioral Health Crisis or need someone to talk to, please call the Suicide and Crisis Lifeline: 13  Kathyrn Sheriff, RN, MSN, BSN, CCM [facility] Care Coordinator [phone]

## 2022-12-09 NOTE — Telephone Encounter (Signed)
Okay.  I think it has been done.  Thank you

## 2022-12-10 ENCOUNTER — Telehealth: Payer: Self-pay | Admitting: Cardiology

## 2022-12-10 DIAGNOSIS — H04123 Dry eye syndrome of bilateral lacrimal glands: Secondary | ICD-10-CM | POA: Diagnosis not present

## 2022-12-10 DIAGNOSIS — Z7982 Long term (current) use of aspirin: Secondary | ICD-10-CM | POA: Diagnosis not present

## 2022-12-10 DIAGNOSIS — G43909 Migraine, unspecified, not intractable, without status migrainosus: Secondary | ICD-10-CM | POA: Diagnosis not present

## 2022-12-10 DIAGNOSIS — M797 Fibromyalgia: Secondary | ICD-10-CM | POA: Diagnosis not present

## 2022-12-10 DIAGNOSIS — D631 Anemia in chronic kidney disease: Secondary | ICD-10-CM | POA: Diagnosis not present

## 2022-12-10 DIAGNOSIS — J45909 Unspecified asthma, uncomplicated: Secondary | ICD-10-CM | POA: Diagnosis not present

## 2022-12-10 DIAGNOSIS — I129 Hypertensive chronic kidney disease with stage 1 through stage 4 chronic kidney disease, or unspecified chronic kidney disease: Secondary | ICD-10-CM | POA: Diagnosis not present

## 2022-12-10 DIAGNOSIS — Z96641 Presence of right artificial hip joint: Secondary | ICD-10-CM | POA: Diagnosis not present

## 2022-12-10 DIAGNOSIS — G8929 Other chronic pain: Secondary | ICD-10-CM | POA: Diagnosis not present

## 2022-12-10 DIAGNOSIS — K589 Irritable bowel syndrome without diarrhea: Secondary | ICD-10-CM | POA: Diagnosis not present

## 2022-12-10 DIAGNOSIS — E871 Hypo-osmolality and hyponatremia: Secondary | ICD-10-CM | POA: Diagnosis not present

## 2022-12-10 DIAGNOSIS — M47812 Spondylosis without myelopathy or radiculopathy, cervical region: Secondary | ICD-10-CM | POA: Diagnosis not present

## 2022-12-10 DIAGNOSIS — N1831 Chronic kidney disease, stage 3a: Secondary | ICD-10-CM | POA: Diagnosis not present

## 2022-12-10 DIAGNOSIS — E559 Vitamin D deficiency, unspecified: Secondary | ICD-10-CM | POA: Diagnosis not present

## 2022-12-10 DIAGNOSIS — N200 Calculus of kidney: Secondary | ICD-10-CM | POA: Diagnosis not present

## 2022-12-10 DIAGNOSIS — D630 Anemia in neoplastic disease: Secondary | ICD-10-CM | POA: Diagnosis not present

## 2022-12-10 DIAGNOSIS — R911 Solitary pulmonary nodule: Secondary | ICD-10-CM | POA: Diagnosis not present

## 2022-12-10 DIAGNOSIS — M5030 Other cervical disc degeneration, unspecified cervical region: Secondary | ICD-10-CM | POA: Diagnosis not present

## 2022-12-10 DIAGNOSIS — I700 Atherosclerosis of aorta: Secondary | ICD-10-CM | POA: Diagnosis not present

## 2022-12-10 DIAGNOSIS — D509 Iron deficiency anemia, unspecified: Secondary | ICD-10-CM | POA: Diagnosis not present

## 2022-12-10 DIAGNOSIS — E213 Hyperparathyroidism, unspecified: Secondary | ICD-10-CM | POA: Diagnosis not present

## 2022-12-10 NOTE — Telephone Encounter (Signed)
Threasa Alpha, who home health nurse, is calling to talk with triage about patient

## 2022-12-10 NOTE — Telephone Encounter (Signed)
Rosanne Ashing calls regarding patient elevated BP 130/100 asymptomatic.  Asked to speak to patient.  Discussed patient  medications. She is taking  Hydralazine 10 mg 2 (TWO) tablets TID not 4x PRN as stated on med list She is NOT taking Clonidine as she did not call back from where we LM.  Advised to pick up clonidine today and start tonight at HS as directed.  PT has parameters to call if BP > 100-160 or 60-90.  Advised that parameters to call are if >160/90 until receive new parameters from physician.   Please advise on any other recommendations   Also patient stopped Crestor. She states due to headaches.  She states resolved since stopping the medication.

## 2022-12-13 ENCOUNTER — Other Ambulatory Visit: Payer: 59

## 2022-12-13 ENCOUNTER — Ambulatory Visit (INDEPENDENT_AMBULATORY_CARE_PROVIDER_SITE_OTHER): Payer: 59 | Admitting: Psychology

## 2022-12-13 ENCOUNTER — Telehealth: Payer: Self-pay | Admitting: Internal Medicine

## 2022-12-13 DIAGNOSIS — K589 Irritable bowel syndrome without diarrhea: Secondary | ICD-10-CM | POA: Diagnosis not present

## 2022-12-13 DIAGNOSIS — I129 Hypertensive chronic kidney disease with stage 1 through stage 4 chronic kidney disease, or unspecified chronic kidney disease: Secondary | ICD-10-CM | POA: Diagnosis not present

## 2022-12-13 DIAGNOSIS — F4323 Adjustment disorder with mixed anxiety and depressed mood: Secondary | ICD-10-CM

## 2022-12-13 DIAGNOSIS — E213 Hyperparathyroidism, unspecified: Secondary | ICD-10-CM | POA: Diagnosis not present

## 2022-12-13 DIAGNOSIS — G8929 Other chronic pain: Secondary | ICD-10-CM | POA: Diagnosis not present

## 2022-12-13 DIAGNOSIS — D259 Leiomyoma of uterus, unspecified: Secondary | ICD-10-CM | POA: Diagnosis not present

## 2022-12-13 DIAGNOSIS — D631 Anemia in chronic kidney disease: Secondary | ICD-10-CM | POA: Diagnosis not present

## 2022-12-13 DIAGNOSIS — F3130 Bipolar disorder, current episode depressed, mild or moderate severity, unspecified: Secondary | ICD-10-CM

## 2022-12-13 DIAGNOSIS — N200 Calculus of kidney: Secondary | ICD-10-CM | POA: Diagnosis not present

## 2022-12-13 DIAGNOSIS — Z8616 Personal history of COVID-19: Secondary | ICD-10-CM

## 2022-12-13 DIAGNOSIS — J45909 Unspecified asthma, uncomplicated: Secondary | ICD-10-CM | POA: Diagnosis not present

## 2022-12-13 DIAGNOSIS — M47812 Spondylosis without myelopathy or radiculopathy, cervical region: Secondary | ICD-10-CM | POA: Diagnosis not present

## 2022-12-13 DIAGNOSIS — R911 Solitary pulmonary nodule: Secondary | ICD-10-CM | POA: Diagnosis not present

## 2022-12-13 DIAGNOSIS — E559 Vitamin D deficiency, unspecified: Secondary | ICD-10-CM | POA: Diagnosis not present

## 2022-12-13 DIAGNOSIS — H04123 Dry eye syndrome of bilateral lacrimal glands: Secondary | ICD-10-CM | POA: Diagnosis not present

## 2022-12-13 DIAGNOSIS — Z8701 Personal history of pneumonia (recurrent): Secondary | ICD-10-CM

## 2022-12-13 DIAGNOSIS — M5030 Other cervical disc degeneration, unspecified cervical region: Secondary | ICD-10-CM | POA: Diagnosis not present

## 2022-12-13 DIAGNOSIS — Z87440 Personal history of urinary (tract) infections: Secondary | ICD-10-CM

## 2022-12-13 DIAGNOSIS — Z96641 Presence of right artificial hip joint: Secondary | ICD-10-CM | POA: Diagnosis not present

## 2022-12-13 DIAGNOSIS — I10 Essential (primary) hypertension: Secondary | ICD-10-CM

## 2022-12-13 DIAGNOSIS — F411 Generalized anxiety disorder: Secondary | ICD-10-CM

## 2022-12-13 DIAGNOSIS — Z7982 Long term (current) use of aspirin: Secondary | ICD-10-CM | POA: Diagnosis not present

## 2022-12-13 DIAGNOSIS — Z87442 Personal history of urinary calculi: Secondary | ICD-10-CM

## 2022-12-13 DIAGNOSIS — E871 Hypo-osmolality and hyponatremia: Secondary | ICD-10-CM | POA: Diagnosis not present

## 2022-12-13 DIAGNOSIS — I700 Atherosclerosis of aorta: Secondary | ICD-10-CM | POA: Diagnosis not present

## 2022-12-13 DIAGNOSIS — F4321 Adjustment disorder with depressed mood: Secondary | ICD-10-CM | POA: Diagnosis not present

## 2022-12-13 DIAGNOSIS — D509 Iron deficiency anemia, unspecified: Secondary | ICD-10-CM | POA: Diagnosis not present

## 2022-12-13 DIAGNOSIS — N1831 Chronic kidney disease, stage 3a: Secondary | ICD-10-CM | POA: Diagnosis not present

## 2022-12-13 DIAGNOSIS — G43909 Migraine, unspecified, not intractable, without status migrainosus: Secondary | ICD-10-CM | POA: Diagnosis not present

## 2022-12-13 DIAGNOSIS — Z86718 Personal history of other venous thrombosis and embolism: Secondary | ICD-10-CM

## 2022-12-13 DIAGNOSIS — M797 Fibromyalgia: Secondary | ICD-10-CM | POA: Diagnosis not present

## 2022-12-13 DIAGNOSIS — Z9181 History of falling: Secondary | ICD-10-CM

## 2022-12-13 DIAGNOSIS — D630 Anemia in neoplastic disease: Secondary | ICD-10-CM | POA: Diagnosis not present

## 2022-12-13 NOTE — Telephone Encounter (Signed)
Caller [employer]:  April with [facility]   Phone Number:[phone]   Needs Verbal orders for what service & frequency:  Nursing 1 week x 5

## 2022-12-13 NOTE — Progress Notes (Signed)
   12/13/2022  Patient ID: [name], female   DOB: [birth year], 72 y.o.   MRN: [id number]  S/O Patient outreach to discuss medication management after referral was received from patient's Care Coordinator  Medication Management -Reviewed medication list and updated based on how patient is taking current regimen -She endorses good adherence and states [facility] nurse coming out to organize pill box weekly for now -No affordability or transportation barriers at this time -Patient was experiencing headache regularly and thought it may be related to statin therapy.  She stopped the rosuvastatin 10mg  for a while; but this did not resolve the headache, so she restarted therapy. -Started clonidine 0.2mg  daily this past Friday and can tell a big difference- the headache has gone away, and home BP has greatly improved -Home BP has been 124/75, HR 73 -Patient is taking hydralazine as prescribed by Dr. Sherryll Burger earlier in July:  20mg  TID.  More recent script from Dr. Posey Rea has directions for 10mg  QID  A/P  Medication Management  -Discussed adherence packaging and mail order with [facility], because [facility] nurse will only be coming out for 4 more weekly visits.  Patient states she would to get set up for these services.  She would like OTC's included, too, if possible. -I recommend continuing current hydralazine based on current BP control.  Pending refill for Dr. Posey Rea to sign if in agreement.    Follow-up:  Scheduled for 8/19 to further discuss and initiate moving scripts over to [facility]  Lenna Gilford, PharmD, DPLA

## 2022-12-13 NOTE — Telephone Encounter (Signed)
Called April w/ [facility] ok for verbal. Pt has good compliance w/ appt.Marland KitchenRaechel Chute

## 2022-12-13 NOTE — Progress Notes (Signed)
Walta [place] LEWIS Time: 4:10p-5:00 p50 minutes   [name] is a 72 y.o. 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 (Status: maintained) -- No Description Entered  Diminished interest in or enjoyment of activities. (Status: maintained) -- No Description Entered  Lack of energy. (Status: maintained) -- No Description Entered  Sad affect, social withdrawal, anxiety, loss of interest in activities, and low energy. (Status: maintained) -- No Description Entered  Medication Status compliance  Safety unspecified If Suicidal or Homicidal State Action Taken: unspecified  Current Risk:  Medications Buspar (Dosage: 30mg  2X/day)  New Medication (Dosage: 250mg  2x/day)  New Medication (Dosage: 250mg  2x/day)  Prozac (Dosage: 40mg  daily)  Remeron (Dosage: 30mg  nightly)  xanax (Dosage: .5mg  2-3X/day)  Objectives Related Problem: Recognize, accept, and cope with feelings of depression. Description: Identify and replace thoughts and beliefs that support depression. Target Date: 2023-04-29 Frequency: Daily Modality: individual Progress: 75%  Related Problem: Recognize, accept, and cope with feelings of depression. Description: Verbalize an understanding of healthy and unhealthy emotions with the intent of increasing the use of healthy emotions to guide actions. Target Date: 2023-04-29 Frequency: Daily Modality: individual Progress: 85%  Related Problem: Recognize, accept, and cope with feelings of depression. Description: Increasingly verbalize hopeful and positive statements regarding self, others, and the future. Target Date: 2022-04-28 Frequency: Daily Modality: individual Progress: 100%  Related Problem: Reduce fear, anxiety, and worry associated with the medical  condition. Description: Share with significant others efforts to adapt successfully to the medical condition/diagnosis. Target Date: 2023-04-29 Frequency: Daily Modality: individual Progress: 85%  Related Problem: Reduce fear, anxiety, and worry associated with the medical condition. Description: Engage in social, productive, and recreational activities that are possible in spite of medical condition. Target Date: 2023-04-29 Frequency: Daily Modality: individual Progress: 90%  Related Problem: Reduce fear, anxiety, and worry associated with the medical condition. Description: Identify the coping skills and sources of emotional support that have been beneficial in the past. Target Date: 2023-04-29 Frequency: Daily Modality: individual Progress: 80%  Client Response full compliance  Service Location Location, [address] [place], [place] [address]  Service Code cpt [phone]  Related past to present  Facilitate problem solving  Identify/label emotions  Validate/empathize  Self care activities  Lifestyle change (exercise, nutrition)  Assess/facilitate readiness to change  Relaxation training  Rationally challenge thoughts or beliefs/cognitive restructuring  Comments  Dx: F43.21  Meds: Prozac, [name], patient is struggling to rebuild her life and adjust to her new circumstances. She suffers a negative self-concept and has little confidence in her judgement. She is seeking to reduce symptoms of anxiety and self-doubt and to re-define satisfa ction moving forward. Therapy will focus on cognitive restructuring to redefine her experiences and focus on those satisfying aspects of her life. Will target this goal completion for April1, 2021 (completed). She is also wanting to utilize counseling to adjust and accept her latest diagnosis of cancer. She wants assistance to better manage this news and remain positive throughout  treatment. Goal date is 12-22. Patient has made progress, but her medical condition is variable and she requests additional treatment to manage moods throughout her medical ordeal.  In addition, she is trying to manage feelings associated with her older son's mental health struggles. Revised goal date is 12-24.  Patient agrees to have a video Public affairs consultant) session and is aware of the limitations of this platform. She is at home and I am in office.   Shalini Stinebaugh says she is "tired and just hanging in". She is exhausted and has no energy. Blood pressure is better due to the new medicine. She says she is not depressed, but sad about her (medical) situation. Her doctor is pushing her to get a hip replacement and is optimistic it will be very successful and helpful. She needs approval of cardiologist and primary care doctor. She is inclined to go ahead and move forward with the surgery.States she realizes she may not be able to drive again, but will consult with doctor. Is confident she will be "okay" either way. Is "choosing" to remain optimistic and be forward thinking.                                                                                                                                                                                               Katrinia Straker LEWIS Time: 4:15p-5:00p 45 minutes

## 2022-12-15 ENCOUNTER — Encounter (INDEPENDENT_AMBULATORY_CARE_PROVIDER_SITE_OTHER): Payer: Self-pay

## 2022-12-16 DIAGNOSIS — D631 Anemia in chronic kidney disease: Secondary | ICD-10-CM | POA: Diagnosis not present

## 2022-12-16 DIAGNOSIS — Z7982 Long term (current) use of aspirin: Secondary | ICD-10-CM | POA: Diagnosis not present

## 2022-12-16 DIAGNOSIS — M797 Fibromyalgia: Secondary | ICD-10-CM | POA: Diagnosis not present

## 2022-12-16 DIAGNOSIS — E213 Hyperparathyroidism, unspecified: Secondary | ICD-10-CM | POA: Diagnosis not present

## 2022-12-16 DIAGNOSIS — M5030 Other cervical disc degeneration, unspecified cervical region: Secondary | ICD-10-CM | POA: Diagnosis not present

## 2022-12-16 DIAGNOSIS — H04123 Dry eye syndrome of bilateral lacrimal glands: Secondary | ICD-10-CM | POA: Diagnosis not present

## 2022-12-16 DIAGNOSIS — G8929 Other chronic pain: Secondary | ICD-10-CM | POA: Diagnosis not present

## 2022-12-16 DIAGNOSIS — D630 Anemia in neoplastic disease: Secondary | ICD-10-CM | POA: Diagnosis not present

## 2022-12-16 DIAGNOSIS — I129 Hypertensive chronic kidney disease with stage 1 through stage 4 chronic kidney disease, or unspecified chronic kidney disease: Secondary | ICD-10-CM | POA: Diagnosis not present

## 2022-12-16 DIAGNOSIS — N200 Calculus of kidney: Secondary | ICD-10-CM | POA: Diagnosis not present

## 2022-12-16 DIAGNOSIS — Z96641 Presence of right artificial hip joint: Secondary | ICD-10-CM | POA: Diagnosis not present

## 2022-12-16 DIAGNOSIS — N1831 Chronic kidney disease, stage 3a: Secondary | ICD-10-CM | POA: Diagnosis not present

## 2022-12-16 DIAGNOSIS — K589 Irritable bowel syndrome without diarrhea: Secondary | ICD-10-CM | POA: Diagnosis not present

## 2022-12-16 DIAGNOSIS — R911 Solitary pulmonary nodule: Secondary | ICD-10-CM | POA: Diagnosis not present

## 2022-12-16 DIAGNOSIS — E559 Vitamin D deficiency, unspecified: Secondary | ICD-10-CM | POA: Diagnosis not present

## 2022-12-16 DIAGNOSIS — G43909 Migraine, unspecified, not intractable, without status migrainosus: Secondary | ICD-10-CM | POA: Diagnosis not present

## 2022-12-16 DIAGNOSIS — J45909 Unspecified asthma, uncomplicated: Secondary | ICD-10-CM | POA: Diagnosis not present

## 2022-12-16 DIAGNOSIS — D509 Iron deficiency anemia, unspecified: Secondary | ICD-10-CM | POA: Diagnosis not present

## 2022-12-16 DIAGNOSIS — I700 Atherosclerosis of aorta: Secondary | ICD-10-CM | POA: Diagnosis not present

## 2022-12-16 DIAGNOSIS — M47812 Spondylosis without myelopathy or radiculopathy, cervical region: Secondary | ICD-10-CM | POA: Diagnosis not present

## 2022-12-16 DIAGNOSIS — E871 Hypo-osmolality and hyponatremia: Secondary | ICD-10-CM | POA: Diagnosis not present

## 2022-12-18 ENCOUNTER — Telehealth: Payer: Self-pay | Admitting: Cardiology

## 2022-12-18 ENCOUNTER — Other Ambulatory Visit: Payer: Self-pay | Admitting: Internal Medicine

## 2022-12-18 NOTE — Telephone Encounter (Signed)
    Patient called today to request advise about blood pressure medication. She has a recent history of difficult to control hypertension and was started on Clonidine 0.2mg  last week with excellent response in blood pressure. Home readings have been averaging 120s-130s/80s. Today upon checking BP, patient noted that blood pressure was much lower, 98/46 mmHg, 85/62 mmHg. Despite the lower readings, she's not feeling dizzy or lightheaded, just a little more fatigued than usual. Also denies chest pain or shortness of breath. We confirmed her blood pressure medication regimen: She takes Losartan 50mg  every morning, coreg 12.5mg , Hydralazine 20mg  TID, and clonidine 0.2mg  nightly. She is certain no missed doses or extra doses today. Since finding low blood pressure, she has consumed several Gatorade Zero's and is feeling a little better. I advised her to recheck blood pressure this evening prior to taking evening doses of medication. If BP stable as has been recently, she can continue taking medications as prescribed. If lower than recent readings but above , advised to take only Coreg. If less than 110, she should hold all evening anti-hypertensive agents. Patient understands that if she becomes symptomatic, she would need ED evaluation.  Perlie Gold, PA-C

## 2022-12-20 ENCOUNTER — Encounter: Payer: Self-pay | Admitting: *Deleted

## 2022-12-20 ENCOUNTER — Telehealth: Payer: Self-pay | Admitting: *Deleted

## 2022-12-20 DIAGNOSIS — H1789 Other corneal scars and opacities: Secondary | ICD-10-CM | POA: Diagnosis not present

## 2022-12-20 DIAGNOSIS — H35371 Puckering of macula, right eye: Secondary | ICD-10-CM | POA: Diagnosis not present

## 2022-12-20 DIAGNOSIS — H52203 Unspecified astigmatism, bilateral: Secondary | ICD-10-CM | POA: Diagnosis not present

## 2022-12-20 NOTE — Patient Outreach (Signed)
  Care Coordination   Post-TOC Care Coordination Telephone   Visit Note   12/20/2022 Name: [name] MRN: [id number] DOB: [birth year]  [name] is a 72 y.o. year old female who sees Plotnikov, Georgina Quint, MD for primary care. I spoke with  [name] by phone today.  What matters to the patients health and wellness today?  "I just had some more questions about the Mom's meals... I am not sure if I am getting the right amount... can you have the girl that called me in July, call me back? I don't have her number.  The home health nurse came by and filed my pill box up, which was very helpful; I go see my neurologist next week, and an eye doctor appointment today- I have a ride arranged through my insurance company"  SDOH assessments and interventions completed:  No Previously completely- verified that patient is currently active with established RN CM Care Coordinator with scheduled follow up call on 12/27/22   Care Coordination Interventions:  Yes, provided   Interventions Today    Flowsheet Row Most Recent Value  Chronic Disease   Chronic disease during today's visit Other  General Interventions   General Interventions Discussed/Reviewed General Interventions Discussed  Doctor Visits Discussed/Reviewed Doctor Visits Discussed  PCP/Specialist Visits Compliance with follow-up visit  Communication with RN  [sent message to [facility] Guide to contact patient re: previously arranged Mom's Meals- patient has follow up questions]  Education Interventions   Education Provided Provided Education  Provided Verbal Education On Other  [role of [employer] Care Guide/ [employer]]  Pharmacy Interventions   Pharmacy Dicussed/Reviewed Pharmacy Topics Discussed  [confirmed with patient that the home health nurse came to her home recently and assisted with filling her pill box- confirmed home health services remain active as of today]       Follow up plan: Follow up call scheduled for 12/27/22 with RN CM Care Coordinator- currently active/ established in patient's care    Encounter Outcome:  Pt. Visit Completed   Caryl Pina, RN, BSN, CCRN Alumnus RN CM Care Coordination/ Transition of Care- [facility] Care Management [phone]: direct office

## 2022-12-21 ENCOUNTER — Ambulatory Visit (INDEPENDENT_AMBULATORY_CARE_PROVIDER_SITE_OTHER): Payer: 59 | Admitting: Internal Medicine

## 2022-12-21 ENCOUNTER — Encounter: Payer: Self-pay | Admitting: Internal Medicine

## 2022-12-21 VITALS — BP 118/76 | HR 70 | Ht 61.81 in | Wt 164.0 lb

## 2022-12-21 DIAGNOSIS — Z9089 Acquired absence of other organs: Secondary | ICD-10-CM

## 2022-12-21 DIAGNOSIS — E871 Hypo-osmolality and hyponatremia: Secondary | ICD-10-CM

## 2022-12-21 DIAGNOSIS — Z9889 Other specified postprocedural states: Secondary | ICD-10-CM

## 2022-12-21 DIAGNOSIS — E039 Hypothyroidism, unspecified: Secondary | ICD-10-CM

## 2022-12-21 MED ORDER — COSYNTROPIN 0.25 MG IJ SOLR
0.2500 mg | Freq: Once | INTRAMUSCULAR | Status: AC
Start: 2022-12-21 — End: 2022-12-21
  Administered 2022-12-21: 0.25 mg via INTRAVENOUS
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
## 2022-12-21 NOTE — Patient Instructions (Signed)

## 2022-12-22 ENCOUNTER — Encounter: Payer: Self-pay | Admitting: Internal Medicine

## 2022-12-22 ENCOUNTER — Ambulatory Visit: Payer: 59 | Admitting: Psychology

## 2022-12-22 DIAGNOSIS — E559 Vitamin D deficiency, unspecified: Secondary | ICD-10-CM | POA: Diagnosis not present

## 2022-12-22 DIAGNOSIS — G43909 Migraine, unspecified, not intractable, without status migrainosus: Secondary | ICD-10-CM | POA: Diagnosis not present

## 2022-12-22 DIAGNOSIS — M5030 Other cervical disc degeneration, unspecified cervical region: Secondary | ICD-10-CM | POA: Diagnosis not present

## 2022-12-22 DIAGNOSIS — I700 Atherosclerosis of aorta: Secondary | ICD-10-CM | POA: Diagnosis not present

## 2022-12-22 DIAGNOSIS — Z9089 Acquired absence of other organs: Secondary | ICD-10-CM | POA: Insufficient documentation

## 2022-12-22 DIAGNOSIS — I129 Hypertensive chronic kidney disease with stage 1 through stage 4 chronic kidney disease, or unspecified chronic kidney disease: Secondary | ICD-10-CM | POA: Diagnosis not present

## 2022-12-22 DIAGNOSIS — N1831 Chronic kidney disease, stage 3a: Secondary | ICD-10-CM | POA: Diagnosis not present

## 2022-12-22 DIAGNOSIS — Z7982 Long term (current) use of aspirin: Secondary | ICD-10-CM | POA: Diagnosis not present

## 2022-12-22 DIAGNOSIS — Z96641 Presence of right artificial hip joint: Secondary | ICD-10-CM | POA: Diagnosis not present

## 2022-12-22 DIAGNOSIS — M797 Fibromyalgia: Secondary | ICD-10-CM | POA: Diagnosis not present

## 2022-12-22 DIAGNOSIS — R911 Solitary pulmonary nodule: Secondary | ICD-10-CM | POA: Diagnosis not present

## 2022-12-22 DIAGNOSIS — H04123 Dry eye syndrome of bilateral lacrimal glands: Secondary | ICD-10-CM | POA: Diagnosis not present

## 2022-12-22 DIAGNOSIS — E871 Hypo-osmolality and hyponatremia: Secondary | ICD-10-CM | POA: Diagnosis not present

## 2022-12-22 DIAGNOSIS — D631 Anemia in chronic kidney disease: Secondary | ICD-10-CM | POA: Diagnosis not present

## 2022-12-22 DIAGNOSIS — N200 Calculus of kidney: Secondary | ICD-10-CM | POA: Diagnosis not present

## 2022-12-22 DIAGNOSIS — K589 Irritable bowel syndrome without diarrhea: Secondary | ICD-10-CM | POA: Diagnosis not present

## 2022-12-22 DIAGNOSIS — J45909 Unspecified asthma, uncomplicated: Secondary | ICD-10-CM | POA: Diagnosis not present

## 2022-12-22 DIAGNOSIS — M47812 Spondylosis without myelopathy or radiculopathy, cervical region: Secondary | ICD-10-CM | POA: Diagnosis not present

## 2022-12-22 DIAGNOSIS — E213 Hyperparathyroidism, unspecified: Secondary | ICD-10-CM | POA: Diagnosis not present

## 2022-12-22 DIAGNOSIS — G8929 Other chronic pain: Secondary | ICD-10-CM | POA: Diagnosis not present

## 2022-12-22 DIAGNOSIS — D509 Iron deficiency anemia, unspecified: Secondary | ICD-10-CM | POA: Diagnosis not present

## 2022-12-22 DIAGNOSIS — D630 Anemia in neoplastic disease: Secondary | ICD-10-CM | POA: Diagnosis not present

## 2022-12-23 ENCOUNTER — Telehealth: Payer: Self-pay | Admitting: Internal Medicine

## 2022-12-23 ENCOUNTER — Telehealth: Payer: Self-pay

## 2022-12-23 NOTE — Telephone Encounter (Signed)
Pt needing skilled nursing ordered pt has a upcoming appt she is having an hip replacement surgery. Please advise.

## 2022-12-23 NOTE — Telephone Encounter (Signed)
Patient aware of results and verbalized understanding.

## 2022-12-26 NOTE — Telephone Encounter (Signed)
It is usually done by the pt's orthopedic team.  Thanks

## 2022-12-27 ENCOUNTER — Ambulatory Visit: Payer: Self-pay

## 2022-12-27 DIAGNOSIS — I10 Essential (primary) hypertension: Secondary | ICD-10-CM

## 2022-12-27 NOTE — Telephone Encounter (Signed)
Notified pt with MD response../lmb 

## 2022-12-27 NOTE — Patient Outreach (Signed)
  Care Coordination   Follow Up Visit Note   12/27/2022 Name: [name] MRN: [id number] DOB: 08/16/1950  [name] is a 73 y.o. year old female who sees Plotnikov, Georgina Quint, MD for primary care. I spoke with  [name] by phone today.  What matters to the patients health and wellness today?  Ms. Rumford reports upcoming hip replacement. Reports plans to return home following surgery, but states has no help in the home. She also reports she has been having issues with memory-has a neurology appointment on 01/03/23.  Goals Addressed             This Visit's Progress    Assist with Health managment       Interventions Today    Flowsheet Row Most Recent Value  Chronic Disease   Chronic disease during today's visit Other, Hypertension (HTN)  [hypnatremia]  General Interventions   General Interventions Discussed/Reviewed General Interventions Reviewed, Doctor Visits, Level of Care, Community Resources  Doctor Visits Discussed/Reviewed Doctor Visits Discussed, Doctor Visits Reviewed  PCP/Specialist Visits Compliance with follow-up visit  [reviewed upcoming/scheduled appointments]  Level of Care --  [referral to care guide for in home care assistance resources, discussed possible increase level of care need post procedure. encouraged to discuss with surgeon.]  Exercise Interventions   Exercise Discussed/Reviewed Exercise Reviewed  [confirmed active with home health PT]  Education Interventions   Education Provided Provided Education  Provided Verbal Education On Other, Insurance Plans  [discussed difference between skilled care and in home assistance. discussed [employer] navigator-provided contact number [phone]) and encouraged patient to call.]  Pharmacy Interventions   Pharmacy Dicussed/Reviewed Pharmacy Topics Discussed  [confirmed patient has been in contact with clinical pharmacist, Jessy Oto the date clinical pharmacist to call back.  confirmed patient active with nursing who is assisting in medication managment at this time]  Safety Interventions   Safety Discussed/Reviewed Safety Reviewed            SDOH assessments and interventions completed:  No  Care Coordination Interventions:  Yes, provided   Follow up plan: Follow up call scheduled for 01/07/23    Encounter Outcome:  Pt. Visit Completed   Kathyrn Sheriff, RN, MSN, BSN, CCM [facility] Care Coordinator [phone]

## 2022-12-27 NOTE — Patient Instructions (Signed)
Visit Information  Thank you for taking time to visit with me today. Please don't hesitate to contact me if I can be of assistance to you.   Following are the goals we discussed today:  Attend provider visits as scheduled Take medications as prescribed Contact provider with health questions or concerns as needed Particpate with home health therapist as recommended Contact your [facility] Nurse Navigator to assist you with plan benefits/questions as [phone]  Our next appointment is by telephone on 01/07/23 at 10:00 am  Please call the care guide team at [phone] if you need to cancel or reschedule your appointment.   If you are experiencing a Mental Health or Behavioral Health Crisis or need someone to talk to, please call the Suicide and Crisis Lifeline: 988 call the [place] National Suicide Prevention Lifeline: 667 738 0832 or TTY: 605-482-8624 TTY (534) 880-2042) to talk to a trained counselor call 1-800-273-TALK (toll free, 24 hour hotline)  Kathyrn Sheriff, RN, MSN, BSN, CCM [facility] Care Coordinator [phone]

## 2022-12-28 ENCOUNTER — Telehealth: Payer: Self-pay | Admitting: *Deleted

## 2022-12-28 NOTE — Progress Notes (Signed)
  Care Coordination Note  12/28/2022 Name: [name] MRN: [id number] DOB: 05/01/1951  [name] is a 72 y.o. year old female who is a primary care patient of Plotnikov, Georgina Quint, MD and is actively engaged with the care management team. I reached out to [name] by phone today to assist with scheduling an initial visit with the BSW  Follow up plan: Telephone appointment with care management team member scheduled for:12/31/2022  Burman Nieves, [facility] Care Coordination Care Guide Direct Dial: [phone]

## 2022-12-28 NOTE — Progress Notes (Signed)
Sent message, via epic in basket, requesting orders in epic from surgeon.  

## 2022-12-28 NOTE — Progress Notes (Signed)
  Care Coordination  Outreach Note  12/28/2022 Name: [name] MRN: [id number] DOB: 1951-03-19   Care Coordination Outreach Attempts: An unsuccessful telephone outreach was attempted today to offer the patient information about available care coordination services.  Follow Up Plan:  Additional outreach attempts will be made to offer the patient care coordination information and services.   Encounter Outcome:  No Answer  Burman Nieves, CCMA Care Coordination Care Guide Direct Dial: [phone]

## 2022-12-29 ENCOUNTER — Other Ambulatory Visit: Payer: Self-pay | Admitting: Cardiology

## 2022-12-29 DIAGNOSIS — E559 Vitamin D deficiency, unspecified: Secondary | ICD-10-CM | POA: Diagnosis not present

## 2022-12-29 DIAGNOSIS — G8929 Other chronic pain: Secondary | ICD-10-CM | POA: Diagnosis not present

## 2022-12-29 DIAGNOSIS — E213 Hyperparathyroidism, unspecified: Secondary | ICD-10-CM | POA: Diagnosis not present

## 2022-12-29 DIAGNOSIS — D631 Anemia in chronic kidney disease: Secondary | ICD-10-CM | POA: Diagnosis not present

## 2022-12-29 DIAGNOSIS — G43909 Migraine, unspecified, not intractable, without status migrainosus: Secondary | ICD-10-CM | POA: Diagnosis not present

## 2022-12-29 DIAGNOSIS — R911 Solitary pulmonary nodule: Secondary | ICD-10-CM | POA: Diagnosis not present

## 2022-12-29 DIAGNOSIS — N200 Calculus of kidney: Secondary | ICD-10-CM | POA: Diagnosis not present

## 2022-12-29 DIAGNOSIS — D630 Anemia in neoplastic disease: Secondary | ICD-10-CM | POA: Diagnosis not present

## 2022-12-29 DIAGNOSIS — J45909 Unspecified asthma, uncomplicated: Secondary | ICD-10-CM | POA: Diagnosis not present

## 2022-12-29 DIAGNOSIS — I10 Essential (primary) hypertension: Secondary | ICD-10-CM

## 2022-12-29 DIAGNOSIS — M5030 Other cervical disc degeneration, unspecified cervical region: Secondary | ICD-10-CM | POA: Diagnosis not present

## 2022-12-29 DIAGNOSIS — Z7982 Long term (current) use of aspirin: Secondary | ICD-10-CM | POA: Diagnosis not present

## 2022-12-29 DIAGNOSIS — M47812 Spondylosis without myelopathy or radiculopathy, cervical region: Secondary | ICD-10-CM | POA: Diagnosis not present

## 2022-12-29 DIAGNOSIS — E871 Hypo-osmolality and hyponatremia: Secondary | ICD-10-CM | POA: Diagnosis not present

## 2022-12-29 DIAGNOSIS — I129 Hypertensive chronic kidney disease with stage 1 through stage 4 chronic kidney disease, or unspecified chronic kidney disease: Secondary | ICD-10-CM | POA: Diagnosis not present

## 2022-12-29 DIAGNOSIS — Z96641 Presence of right artificial hip joint: Secondary | ICD-10-CM | POA: Diagnosis not present

## 2022-12-29 DIAGNOSIS — K589 Irritable bowel syndrome without diarrhea: Secondary | ICD-10-CM | POA: Diagnosis not present

## 2022-12-29 DIAGNOSIS — N1831 Chronic kidney disease, stage 3a: Secondary | ICD-10-CM | POA: Diagnosis not present

## 2022-12-29 DIAGNOSIS — I700 Atherosclerosis of aorta: Secondary | ICD-10-CM | POA: Diagnosis not present

## 2022-12-29 DIAGNOSIS — D509 Iron deficiency anemia, unspecified: Secondary | ICD-10-CM | POA: Diagnosis not present

## 2022-12-29 DIAGNOSIS — Z79899 Other long term (current) drug therapy: Secondary | ICD-10-CM

## 2022-12-29 DIAGNOSIS — M797 Fibromyalgia: Secondary | ICD-10-CM | POA: Diagnosis not present

## 2022-12-29 DIAGNOSIS — H04123 Dry eye syndrome of bilateral lacrimal glands: Secondary | ICD-10-CM | POA: Diagnosis not present

## 2022-12-30 ENCOUNTER — Telehealth: Payer: Self-pay | Admitting: Cardiology

## 2022-12-30 DIAGNOSIS — D509 Iron deficiency anemia, unspecified: Secondary | ICD-10-CM | POA: Diagnosis not present

## 2022-12-30 DIAGNOSIS — E559 Vitamin D deficiency, unspecified: Secondary | ICD-10-CM | POA: Diagnosis not present

## 2022-12-30 DIAGNOSIS — D631 Anemia in chronic kidney disease: Secondary | ICD-10-CM | POA: Diagnosis not present

## 2022-12-30 DIAGNOSIS — E213 Hyperparathyroidism, unspecified: Secondary | ICD-10-CM | POA: Diagnosis not present

## 2022-12-30 DIAGNOSIS — R911 Solitary pulmonary nodule: Secondary | ICD-10-CM | POA: Diagnosis not present

## 2022-12-30 DIAGNOSIS — Z7982 Long term (current) use of aspirin: Secondary | ICD-10-CM | POA: Diagnosis not present

## 2022-12-30 DIAGNOSIS — H04123 Dry eye syndrome of bilateral lacrimal glands: Secondary | ICD-10-CM | POA: Diagnosis not present

## 2022-12-30 DIAGNOSIS — G43909 Migraine, unspecified, not intractable, without status migrainosus: Secondary | ICD-10-CM | POA: Diagnosis not present

## 2022-12-30 DIAGNOSIS — Z96641 Presence of right artificial hip joint: Secondary | ICD-10-CM | POA: Diagnosis not present

## 2022-12-30 DIAGNOSIS — M47812 Spondylosis without myelopathy or radiculopathy, cervical region: Secondary | ICD-10-CM | POA: Diagnosis not present

## 2022-12-30 DIAGNOSIS — M5030 Other cervical disc degeneration, unspecified cervical region: Secondary | ICD-10-CM | POA: Diagnosis not present

## 2022-12-30 DIAGNOSIS — N200 Calculus of kidney: Secondary | ICD-10-CM | POA: Diagnosis not present

## 2022-12-30 DIAGNOSIS — N1831 Chronic kidney disease, stage 3a: Secondary | ICD-10-CM | POA: Diagnosis not present

## 2022-12-30 DIAGNOSIS — D630 Anemia in neoplastic disease: Secondary | ICD-10-CM | POA: Diagnosis not present

## 2022-12-30 DIAGNOSIS — I129 Hypertensive chronic kidney disease with stage 1 through stage 4 chronic kidney disease, or unspecified chronic kidney disease: Secondary | ICD-10-CM | POA: Diagnosis not present

## 2022-12-30 DIAGNOSIS — M797 Fibromyalgia: Secondary | ICD-10-CM | POA: Diagnosis not present

## 2022-12-30 DIAGNOSIS — I700 Atherosclerosis of aorta: Secondary | ICD-10-CM | POA: Diagnosis not present

## 2022-12-30 DIAGNOSIS — E871 Hypo-osmolality and hyponatremia: Secondary | ICD-10-CM | POA: Diagnosis not present

## 2022-12-30 DIAGNOSIS — J45909 Unspecified asthma, uncomplicated: Secondary | ICD-10-CM | POA: Diagnosis not present

## 2022-12-30 DIAGNOSIS — K589 Irritable bowel syndrome without diarrhea: Secondary | ICD-10-CM | POA: Diagnosis not present

## 2022-12-30 DIAGNOSIS — G8929 Other chronic pain: Secondary | ICD-10-CM | POA: Diagnosis not present

## 2022-12-30 NOTE — Telephone Encounter (Signed)
[name] with [facility] called to inform pt bp is 176/110 with headache 6/10 most of the day. Recently had a stressful phone call that may have increased bp. She confused her meds so he would like to verify the Hydralazine instructions and dosage.

## 2022-12-30 NOTE — Telephone Encounter (Signed)
Returned call to

## 2022-12-30 NOTE — Progress Notes (Addendum)
Anesthesia Review:  PCP: Plotnikov- LVO 11/30/22 Clearance 12/09/22 on chart  Cardiologist : Lavona Mound Tobb- :VOP 10/29/22- DR Allena Katz  Endo- Shamleffer  Chest x-ray : 1v- 11/20/22  EKG : 03/25/22  Monitor- 02/25/22  Echo : 2019  Stress test: 2019  Cardiac Cath :  2019  Activity level:  Sleep Study/ CPAP : Fasting Blood Sugar :      / Checks Blood Sugar -- times a day:   Blood Thinner/ Instructions /Last Dose: ASA / Instructions/ Last Dose :    12/21/22- CMP

## 2022-12-30 NOTE — Telephone Encounter (Signed)
Called pt home care nurse back. Confirmed the instructions of hydralazine to pt and her [facility] assistant. Pt does get confused at times and [facility] assistant wants [place] to know that it does play a factor when she is trying to remember the doses of her medication.

## 2022-12-31 ENCOUNTER — Other Ambulatory Visit: Payer: Self-pay | Admitting: Internal Medicine

## 2022-12-31 ENCOUNTER — Other Ambulatory Visit: Payer: Self-pay | Admitting: Cardiology

## 2022-12-31 ENCOUNTER — Ambulatory Visit: Payer: Self-pay

## 2022-12-31 DIAGNOSIS — I10 Essential (primary) hypertension: Secondary | ICD-10-CM

## 2022-12-31 NOTE — Patient Instructions (Signed)
Visit Information  Thank you for taking time to visit with me today. Please don't hesitate to contact me if I can be of assistance to you.   Following are the goals we discussed today:  - Engage with Pharmacist Sabino Niemann as scheduled on 8/19 to discuss transitioning to the [facility] order pharmacy - Speak with your orthopedic provider regarding your desire for short-term rehab following scheduled hip surgery   Our next appointment is by telephone on 8/28 at 3:30 pm  Please call the care guide team at [phone] if you need to cancel or reschedule your appointment.   If you are experiencing a Mental Health or Behavioral Health Crisis or need someone to talk to, please call 1-800-273-TALK (toll free, 24 hour hotline) go to [facility] [address], [place] [phone]) call 911  Patient verbalizes understanding of instructions and care plan provided today and agrees to view in MyChart. Active MyChart status and patient understanding of how to access instructions and care plan via MyChart confirmed with patient.     Bevelyn Ngo, BSW, CDP Social Worker, Certified Dementia Practitioner [facility] Care Management  Care Coordination [phone]

## 2022-12-31 NOTE — Patient Outreach (Signed)
Care Coordination   Follow Up Visit Note   12/31/2022 Name: [name] MRN: [id number] DOB: [birth year]  [name] is a 72 y.o. year old female who sees Plotnikov, Georgina Quint, MD for primary care. I spoke with  [name] by phone today.  What matters to the patients health and wellness today?  Assistance with medication management; identify caregiver resources to assist with recovery following scheduled hip replacement surgery    Goals Addressed             This Visit's Progress    Care Coordination Activities       Care Coordination Interventions: Discussed the patient is experiencing difficulty with medication management; patient reports she recently realized she is taking her Hydralazine the way her primary care provider has ordered which is 10 mg BID. Patient states her Cardiologist increased this medication to be 20 mg TID. Patient did not realize she was still taking the smaller dose and has been experiencing higher blood pressure readings (154/90 as average) along with headaches Determined the patient did reconcile medications with her home health physical therapist on 8/15 and realized she has two separate pill bottles with separate instructions for her Hydralazine. The patient reports she has since increased to 20 mg TID Patient reports she would like to switch medications from [facility] to [facility] order pharmacy with pill packaging to minimize errors Performed chart review to note the patient is active with PharmD Sabino Niemann; advised the patient SW would collaborate with PharmD advising of patients desire to transition to [facility] outpatient pharmacy. Reminded the patient of upcomming appointment with PharmD on 8/19 Discussed the patient has a planned hip replacement scheduled on 8/29 - she would like to return home with caregiver assistance of 4 hours per day to assist with bathing and meals Determined the patient has a partial Medicaid plan which  does not cover PCS in the home Education provided to the patient on health plan benefits and lack of coverage related to an in home caregiver Discussed the difference in home health services post surgery versus short-term SNF rehab Determined the patient would like to pursue SNF rehab for recovery Educated the patient on the process for placement into rehab following surgery advising the inpatient [facility] team would assist with this task Advised the patient to speak with her Orthopedic Surgeon at her next scheduled appointment on 8/26 to advise of patients desire for short-term rehab post inpatient surgery         SDOH assessments and interventions completed:  No     Care Coordination Interventions:  Yes, provided   Interventions Today    Flowsheet Row Most Recent Value  Chronic Disease   Chronic disease during today's visit Hypertension (HTN)  General Interventions   General Interventions Discussed/Reviewed General Interventions Discussed, Communication with, Doctor Visits, Level of Care  Doctor Visits Discussed/Reviewed Doctor Visits Reviewed  Communication with Pharmacists  [facility] PharmD Sabino Niemann who is active with patient to collaborate on concerns regarding medication management]  Level of Care --  [Reviewed level of care options available to the patient following upcoming planned hip replacement on 8/29]  Education Interventions   Education Provided Provided Education  Provided Verbal Education On [employer] Plans  Pharmacy Interventions   Pharmacy Dicussed/Reviewed Pharmacy Topics Discussed  [Discussed the patient experiences difficulty with medication management and would like to begin receiving mail order pill packs from [facility] outpatient pharmacy]        Follow up  plan: Follow up call scheduled for 8/28    Encounter Outcome:  Pt. Visit Completed   Bevelyn Ngo, Kenard Gower, CDP Social Worker, Certified Dementia Practitioner [facility] Care Management   Care Coordination [phone]

## 2023-01-03 ENCOUNTER — Other Ambulatory Visit: Payer: 59

## 2023-01-03 ENCOUNTER — Ambulatory Visit (INDEPENDENT_AMBULATORY_CARE_PROVIDER_SITE_OTHER): Payer: 59 | Admitting: Diagnostic Neuroimaging

## 2023-01-03 ENCOUNTER — Ambulatory Visit: Payer: Self-pay | Admitting: Student

## 2023-01-03 ENCOUNTER — Encounter: Payer: Self-pay | Admitting: Diagnostic Neuroimaging

## 2023-01-03 VITALS — BP 152/87 | HR 71 | Ht 65.0 in | Wt 163.0 lb

## 2023-01-03 DIAGNOSIS — M25551 Pain in right hip: Secondary | ICD-10-CM | POA: Diagnosis not present

## 2023-01-03 DIAGNOSIS — R413 Other amnesia: Secondary | ICD-10-CM | POA: Diagnosis not present

## 2023-01-03 DIAGNOSIS — R269 Unspecified abnormalities of gait and mobility: Secondary | ICD-10-CM | POA: Diagnosis not present

## 2023-01-03 NOTE — Progress Notes (Signed)
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 intracranial abnormality. - Frontal lobe predominant cerebral atrophy, similar to the prior brain MRI of 10/03/2018.  09/27/22 CT head [I reviewed images myself and agree with interpretation. Stable from 2023. -VRP]  -No acute intracranial findings are seen in noncontrast CT brain. Atrophy.   ASSESSMENT AND PLAN  72 y.o. year old female here with:  Dx:  1. Memory loss   2. Gait difficulty   3. Right hip pain     PLAN:  MEMORY LOSS (mild cognitive impairment vs mild dementia vs anxiety and depression vs mild ADHD) - check ATN panel - safety / supervision issues reviewed - daily physical activity / exercise (at least 15-30 minutes) - eat more plants / vegetables - increase social activities, brain stimulation, games, puzzles, hobbies, crafts, arts, music - aim for at least 7-8 hours sleep per night (or more) - avoid smoking and alcohol - caution with medications, finances, driving, living alone  GAIT  DIFFICULTY (mainly related to right hip pain) - continue plan for right hip arthroplasty and PT exercises thereafter  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  I spent 42 minutes of face-to-face and non-face-to-face time with patient.  This included previsit chart review, lab review, study review, order entry, electronic health record documentation, patient education.     Suanne Marker, MD 01/03/2023, 9:41 AM Certified in Neurology, Neurophysiology and Neuroimaging  [facility] [address], [address] [place], [place] [address] [phone]

## 2023-01-03 NOTE — Patient Instructions (Signed)
SURGICAL WAITING ROOM VISITATION  Patients having surgery or a procedure may have no more than 2 support people in the waiting area - these visitors may rotate.    Children under the age of 51 must have an adult with them who is not the patient.  Due to an increase in RSV and influenza rates and associated hospitalizations, children ages 34 and under may not visit patients in [facility] hospitals.  If the patient needs to stay at the hospital during part of their recovery, the visitor guidelines for inpatient rooms apply. Pre-op nurse will coordinate an appropriate time for 1 support person to accompany patient in pre-op.  This support person may not rotate.    Please refer to the [facility] website for the visitor guidelines for Inpatients (after your surgery is over and you are in a regular room).       Your procedure is scheduled on:  01/13/23    Report to [facility] Main Entrance    Report to admitting at  0515 AM   Call this number if you have problems the morning of surgery [phone]   Do not eat food :After Midnight.   After Midnight you may have the following liquids until _ 0430_____ AM  DAY OF SURGERY  Water Non-Citrus Juices (without pulp, NO RED-Apple, White grape, White cranberry) Black Coffee (NO MILK/CREAM OR CREAMERS, sugar ok)  Clear Tea (NO MILK/CREAM OR CREAMERS, sugar ok) regular and decaf                             Plain Jell-O (NO RED)                                           Fruit ices (not with fruit pulp, NO RED)                                     Popsicles (NO RED)                                                               Sports drinks like Gatorade (NO RED)                    The day of surgery:  Drink ONE (1) Pre-Surgery Clear Ensure or G2 at 0430 ( have completed by ) am of surgery.    Nothing else to drink after completing the  Pre-Surgery Clear Ensure or G2.          If you have questions, please contact your surgeon's  office.       Oral Hygiene is also important to reduce your risk of infection.                                    Remember - BRUSH YOUR TEETH THE MORNING OF SURGERY WITH YOUR REGULAR TOOTHPASTE  DENTURES WILL BE REMOVED PRIOR TO SURGERY PLEASE DO NOT APPLY "Poly grip" OR ADHESIVES!!!  Do NOT smoke after Midnight   Stop all vitamins and herbal supplements 7 days before surgery.   Take these medicines the morning of surgery with A SIP OF WATER:  buspar coreg, mexium, prozac, hydralazine, nebulzier if needed, synthroid,   DO NOT TAKE ANY ORAL DIABETIC MEDICATIONS DAY OF YOUR SURGERY  Bring CPAP mask and tubing day of surgery.                              You may not have any metal on your body including hair pins, jewelry, and body piercing             Do not wear make-up, lotions, powders, perfumes/cologne, or deodorant  Do not wear nail polish including gel and S&S, artificial/acrylic nails, or any other type of covering on natural nails including finger and toenails. If you have artificial nails, gel coating, etc. that needs to be removed by a nail salon please have this removed prior to surgery or surgery may need to be canceled/ delayed if the surgeon/ anesthesia feels like they are unable to be safely monitored.   Do not shave  48 hours prior to surgery.               Men may shave face and neck.   Do not bring valuables to the hospital. [facility] IS NOT             RESPONSIBLE   FOR VALUABLES.   Contacts, glasses, dentures or bridgework may not be worn into surgery.   Bring small overnight bag day of surgery.   DO NOT BRING YOUR HOME MEDICATIONS TO THE HOSPITAL. PHARMACY WILL DISPENSE MEDICATIONS LISTED ON YOUR MEDICATION LIST TO YOU DURING YOUR ADMISSION IN THE HOSPITAL!    Patients discharged on the day of surgery will not be allowed to drive home.  Someone NEEDS to stay with you for the first 24 hours after anesthesia.   Special Instructions: Bring a copy of your  healthcare power of attorney and living will documents the day of surgery if you haven't scanned them before.              Please read over the following fact sheets you were given: IF YOU HAVE QUESTIONS ABOUT YOUR PRE-OP INSTRUCTIONS PLEASE CALL [phone]   If you received a COVID test during your pre-op visit  it is requested that you wear a mask when out in public, stay away from anyone that may not be feeling well and notify your surgeon if you develop symptoms. If you test positive for Covid or have been in contact with anyone that has tested positive in the last 10 days please notify you surgeon.      Pre-operative 5 CHG Bath Instructions   You can play a key role in reducing the risk of infection after surgery. Your skin needs to be as free of germs as possible. You can reduce the number of germs on your skin by washing with CHG (chlorhexidine gluconate) soap before surgery. CHG is an antiseptic soap that kills germs and continues to kill germs even after washing.   DO NOT use if you have an allergy to chlorhexidine/CHG or antibacterial soaps. If your skin becomes reddened or irritated, stop using the CHG and notify one of our RNs at [phone].   Please shower with the CHG soap starting 4 days before surgery using the following schedule:     Please  keep in mind the following:  DO NOT shave, including legs and underarms, starting the day of your first shower.   You may shave your face at any point before/day of surgery.  Place clean sheets on your bed the day you start using CHG soap. Use a clean washcloth (not used since being washed) for each shower. DO NOT sleep with pets once you start using the CHG.   CHG Shower Instructions:  If you choose to wash your hair and private area, wash first with your normal shampoo/soap.  After you use shampoo/soap, rinse your hair and body thoroughly to remove shampoo/soap residue.  Turn the water OFF and apply about 3 tablespoons (45 ml)  of CHG soap to a CLEAN washcloth.  Apply CHG soap ONLY FROM YOUR NECK DOWN TO YOUR TOES (washing for 3-5 minutes)  DO NOT use CHG soap on face, private areas, open wounds, or sores.  Pay special attention to the area where your surgery is being performed.  If you are having back surgery, having someone wash your back for you may be helpful. Wait 2 minutes after CHG soap is applied, then you may rinse off the CHG soap.  Pat dry with a clean towel  Put on clean clothes/pajamas   If you choose to wear lotion, please use ONLY the CHG-compatible lotions on the back of this paper.     Additional instructions for the day of surgery: DO NOT APPLY any lotions, deodorants, cologne, or perfumes.   Put on clean/comfortable clothes.  Brush your teeth.  Ask your nurse before applying any prescription medications to the skin.      CHG Compatible Lotions   Aveeno Moisturizing lotion  Cetaphil Moisturizing Cream  Cetaphil Moisturizing Lotion  Clairol Herbal Essence Moisturizing Lotion, Dry Skin  Clairol Herbal Essence Moisturizing Lotion, Extra Dry Skin  Clairol Herbal Essence Moisturizing Lotion, Normal Skin  Curel Age Defying Therapeutic Moisturizing Lotion with Alpha Hydroxy  Curel Extreme Care Body Lotion  Curel Soothing Hands Moisturizing Hand Lotion  Curel Therapeutic Moisturizing Cream, Fragrance-Free  Curel Therapeutic Moisturizing Lotion, Fragrance-Free  Curel Therapeutic Moisturizing Lotion, Original Formula  Eucerin Daily Replenishing Lotion  Eucerin Dry Skin Therapy Plus Alpha Hydroxy Crme  Eucerin Dry Skin Therapy Plus Alpha Hydroxy Lotion  Eucerin Original Crme  Eucerin Original Lotion  Eucerin Plus Crme Eucerin Plus Lotion  Eucerin TriLipid Replenishing Lotion  Keri Anti-Bacterial Hand Lotion  Keri Deep Conditioning Original Lotion Dry Skin Formula Softly Scented  Keri Deep Conditioning Original Lotion, Fragrance Free Sensitive Skin Formula  Keri Lotion Fast Absorbing  Fragrance Free Sensitive Skin Formula  Keri Lotion Fast Absorbing Softly Scented Dry Skin Formula  Keri Original Lotion  Keri Skin Renewal Lotion Keri Silky Smooth Lotion  Keri Silky Smooth Sensitive Skin Lotion  Nivea Body Creamy Conditioning Oil  Nivea Body Extra Enriched Teacher, adult education Moisturizing Lotion Nivea Crme  Nivea Skin Firming Lotion  NutraDerm 30 Skin Lotion  NutraDerm Skin Lotion  NutraDerm Therapeutic Skin Cream  NutraDerm Therapeutic Skin Lotion  ProShield Protective Hand Cream  Provon moisturizing lotion

## 2023-01-03 NOTE — Progress Notes (Signed)
   01/03/2023  Patient ID: [name], female   DOB: 03-15-1951, 72 y.o.   MRN: [id number]  [name] with cards-message about hydralazine 20mg  TID -has continued to take 20mg  TID and BP has been 120-130/80 **set up for adherence packaging and mail order-verified address  -Packing not include:  APAP, lomotil, duoneb, loperamide, ondansetron, systane eye drops, saline nasal spray -taking alprazalom TID-put in packaging; also, ASA, buspirone, carvedilol, clonidine, vitamin D, B-12, D-Mannose, esomeprazole, fluoxetine, levo, poly iron 150 (add to list), losartan, metamucil fiber, mirtazapine , omega-3, rosuvastatin, probiotic   **bring delivery to side door under the carport Will f/u in 1 week

## 2023-01-03 NOTE — Patient Instructions (Signed)
MEMORY LOSS (mild cognitive impairment vs mild dementia vs anxiety and depression vs mild ADHD) - check ATN panel - safety / supervision issues reviewed - daily physical activity / exercise (at least 15-30 minutes) - eat more plants / vegetables - increase social activities, brain stimulation, games, puzzles, hobbies, crafts, arts, music - aim for at least 7-8 hours sleep per night (or more) - avoid smoking and alcohol - caution with medications, finances, driving, living alone  GAIT DIFFICULTY (mainly related to right hip pain) - continue plan for right hip arthroplasty and PT exercises thereafter  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

## 2023-01-04 ENCOUNTER — Encounter (HOSPITAL_COMMUNITY)
Admission: RE | Admit: 2023-01-04 | Discharge: 2023-01-04 | Disposition: A | Discharge status: Home / Self Care | Payer: 59 | Source: Ambulatory Visit | Attending: Orthopedic Surgery | Admitting: Orthopedic Surgery

## 2023-01-04 ENCOUNTER — Encounter (HOSPITAL_COMMUNITY): Payer: Self-pay

## 2023-01-04 ENCOUNTER — Other Ambulatory Visit: Payer: Self-pay

## 2023-01-04 VITALS — BP 167/102 | HR 67 | Temp 98.2°F | Resp 16 | Ht 61.0 in

## 2023-01-04 DIAGNOSIS — Z01812 Encounter for preprocedural laboratory examination: Secondary | ICD-10-CM | POA: Diagnosis not present

## 2023-01-04 DIAGNOSIS — Z01818 Encounter for other preprocedural examination: Secondary | ICD-10-CM

## 2023-01-04 HISTORY — DX: Chronic obstructive pulmonary disease, unspecified: J44.9

## 2023-01-04 LAB — BASIC METABOLIC PANEL
Anion gap: 11 (ref 5–15)
BUN: 16 mg/dL (ref 8–23)
CO2: 22 mmol/L (ref 22–32)
Calcium: 8.6 mg/dL — ABNORMAL LOW (ref 8.9–10.3)
Chloride: 107 mmol/L (ref 98–111)
Creatinine, Ser: 1.13 mg/dL — ABNORMAL HIGH (ref 0.44–1.00)
GFR, Estimated: 52 mL/min — ABNORMAL LOW (ref >60)
Glucose, Bld: 93 mg/dL (ref 70–99)
Potassium: 4.1 mmol/L (ref 3.5–5.1)
Sodium: 140 mmol/L (ref 135–145)

## 2023-01-04 LAB — CBC
HCT: 35.1 % — ABNORMAL LOW (ref 36.0–46.0)
Hemoglobin: 11.5 g/dL — ABNORMAL LOW (ref 12.0–15.0)
MCH: 31.5 pg (ref 26.0–34.0)
MCHC: 32.8 g/dL (ref 30.0–36.0)
MCV: 96.2 fL (ref 80.0–100.0)
Platelets: 305 10*3/uL (ref 150–400)
RBC: 3.65 MIL/uL — ABNORMAL LOW (ref 3.87–5.11)
RDW: 13.7 % (ref 11.5–15.5)
WBC: 6.9 10*3/uL (ref 4.0–10.5)
nRBC: 0 % (ref 0.0–0.2)

## 2023-01-04 LAB — SURGICAL PCR SCREEN
MRSA, PCR: NEGATIVE
Staphylococcus aureus: NEGATIVE

## 2023-01-05 ENCOUNTER — Other Ambulatory Visit: Payer: Self-pay

## 2023-01-05 ENCOUNTER — Other Ambulatory Visit: Payer: Self-pay | Admitting: Internal Medicine

## 2023-01-05 ENCOUNTER — Ambulatory Visit (INDEPENDENT_AMBULATORY_CARE_PROVIDER_SITE_OTHER): Payer: 59 | Admitting: Psychology

## 2023-01-05 DIAGNOSIS — M797 Fibromyalgia: Secondary | ICD-10-CM | POA: Diagnosis not present

## 2023-01-05 DIAGNOSIS — G8929 Other chronic pain: Secondary | ICD-10-CM | POA: Diagnosis not present

## 2023-01-05 DIAGNOSIS — G43909 Migraine, unspecified, not intractable, without status migrainosus: Secondary | ICD-10-CM | POA: Diagnosis not present

## 2023-01-05 DIAGNOSIS — K589 Irritable bowel syndrome without diarrhea: Secondary | ICD-10-CM | POA: Diagnosis not present

## 2023-01-05 DIAGNOSIS — I700 Atherosclerosis of aorta: Secondary | ICD-10-CM | POA: Diagnosis not present

## 2023-01-05 DIAGNOSIS — F4321 Adjustment disorder with depressed mood: Secondary | ICD-10-CM | POA: Diagnosis not present

## 2023-01-05 DIAGNOSIS — D631 Anemia in chronic kidney disease: Secondary | ICD-10-CM | POA: Diagnosis not present

## 2023-01-05 DIAGNOSIS — Z96641 Presence of right artificial hip joint: Secondary | ICD-10-CM | POA: Diagnosis not present

## 2023-01-05 DIAGNOSIS — M47812 Spondylosis without myelopathy or radiculopathy, cervical region: Secondary | ICD-10-CM | POA: Diagnosis not present

## 2023-01-05 DIAGNOSIS — N200 Calculus of kidney: Secondary | ICD-10-CM | POA: Diagnosis not present

## 2023-01-05 DIAGNOSIS — E213 Hyperparathyroidism, unspecified: Secondary | ICD-10-CM | POA: Diagnosis not present

## 2023-01-05 DIAGNOSIS — N1831 Chronic kidney disease, stage 3a: Secondary | ICD-10-CM | POA: Diagnosis not present

## 2023-01-05 DIAGNOSIS — D509 Iron deficiency anemia, unspecified: Secondary | ICD-10-CM | POA: Diagnosis not present

## 2023-01-05 DIAGNOSIS — R911 Solitary pulmonary nodule: Secondary | ICD-10-CM | POA: Diagnosis not present

## 2023-01-05 DIAGNOSIS — I129 Hypertensive chronic kidney disease with stage 1 through stage 4 chronic kidney disease, or unspecified chronic kidney disease: Secondary | ICD-10-CM | POA: Diagnosis not present

## 2023-01-05 DIAGNOSIS — E559 Vitamin D deficiency, unspecified: Secondary | ICD-10-CM | POA: Diagnosis not present

## 2023-01-05 DIAGNOSIS — D630 Anemia in neoplastic disease: Secondary | ICD-10-CM | POA: Diagnosis not present

## 2023-01-05 DIAGNOSIS — E871 Hypo-osmolality and hyponatremia: Secondary | ICD-10-CM | POA: Diagnosis not present

## 2023-01-05 DIAGNOSIS — M1712 Unilateral primary osteoarthritis, left knee: Secondary | ICD-10-CM | POA: Diagnosis not present

## 2023-01-05 DIAGNOSIS — Z7982 Long term (current) use of aspirin: Secondary | ICD-10-CM | POA: Diagnosis not present

## 2023-01-05 DIAGNOSIS — F4323 Adjustment disorder with mixed anxiety and depressed mood: Secondary | ICD-10-CM

## 2023-01-05 DIAGNOSIS — M5030 Other cervical disc degeneration, unspecified cervical region: Secondary | ICD-10-CM | POA: Diagnosis not present

## 2023-01-05 DIAGNOSIS — H04123 Dry eye syndrome of bilateral lacrimal glands: Secondary | ICD-10-CM | POA: Diagnosis not present

## 2023-01-05 DIAGNOSIS — J45909 Unspecified asthma, uncomplicated: Secondary | ICD-10-CM | POA: Diagnosis not present

## 2023-01-05 NOTE — Progress Notes (Signed)
[name] Time: 4:10p-5:00 p50 minutes   [name] is a 72 y.o. 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 (Status: maintained) -- No Description Entered  Diminished interest in or enjoyment of activities. (Status: maintained) -- No Description Entered  Lack of energy. (Status: maintained) -- No Description Entered  Sad affect, social withdrawal, anxiety, loss of interest in activities, and low energy. (Status: maintained) -- No Description Entered  Medication Status compliance  Safety unspecified If Suicidal or Homicidal State Action Taken: unspecified  Current Risk:  Medications Buspar (Dosage: 30mg  2X/day)  New Medication (Dosage: 250mg  2x/day)  New Medication (Dosage: 250mg  2x/day)  Prozac (Dosage: 40mg  daily)  Remeron (Dosage: 30mg  nightly)  xanax (Dosage: .5mg  2-3X/day)  Objectives Related Problem: Recognize, accept, and cope with feelings of depression. Description: Identify and replace thoughts and beliefs that support depression. Target Date: 2023-04-29 Frequency: Daily Modality: individual Progress: 75%  Related Problem: Recognize, accept, and cope with feelings of depression. Description: Verbalize an understanding of healthy and unhealthy emotions with the intent of increasing the use of healthy emotions to guide actions. Target Date: 2023-04-29 Frequency: Daily Modality: individual Progress: 85%  Related Problem: Recognize, accept, and cope with feelings of depression. Description: Increasingly verbalize hopeful and positive statements regarding self, others, and the future. Target Date: 2022-04-28 Frequency: Daily Modality: individual Progress: 100%  Related Problem: Reduce fear, anxiety, and worry  associated with the medical condition. Description: Share with significant others efforts to adapt successfully to the medical condition/diagnosis. Target Date: 2023-04-29 Frequency: Daily Modality: individual Progress: 85%  Related Problem: Reduce fear, anxiety, and worry associated with the medical condition. Description: Engage in social, productive, and recreational activities that are possible in spite of medical condition. Target Date: 2023-04-29 Frequency: Daily Modality: individual Progress: 90%  Related Problem: Reduce fear, anxiety, and worry associated with the medical condition. Description: Identify the coping skills and sources of emotional support that have been beneficial in the past. Target Date: 2023-04-29 Frequency: Daily Modality: individual Progress: 80%  Client Response full compliance  Service Location Location, [address] [place], [place] [address]  Service Code cpt 8430373497  Related past to present  Facilitate problem solving  Identify/label emotions  Validate/empathize  Self care activities  Lifestyle change (exercise, nutrition)  Assess/facilitate readiness to change  Relaxation training  Rationally challenge thoughts or beliefs/cognitive restructuring  Comments  Dx: F43.21  Meds: Prozac, [name], patient is struggling to rebuild her life and adjust to her new circumstances. She suffers a negative self-concept and has little confidence in her judgement. She is seeking to reduce symptoms of anxiety and self-doubt and to re-define satisfa ction moving forward. Therapy will focus on cognitive restructuring to redefine her experiences and focus on those satisfying aspects of her life. Will target this goal completion for April1, 2021 (completed). She is also wanting to utilize counseling to adjust and accept her latest diagnosis of cancer. She wants assistance to better manage this news and  remain positive throughout treatment. Goal date is 12-22. Patient has made progress, but her medical  condition is variable and she requests additional treatment to manage moods throughout her medical ordeal. In addition, she is trying to manage feelings associated with her older son's mental health struggles. Revised goal date is 12-24.  Patient agrees to have a video Public affairs consultant) session and is aware of the limitations of this platform. She is at home and I am in office.   [name] states that thing have really fallen apart since we last met. She is scheduled for a hip replacement Aug. 29th. She also fell and hurt her knee. 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 Her Aunt and [name] will help take care of her post-surgery. She will likely need a knee replacement as well. She feels like she is falling apart. She felt that she was very distraught and called [phone] for the first time. Denies that she felt suicidal, but was very distressed. Talked with a psychologist for an hour and felt it was very helpful. She is feeling more stable now and more optimistic about her future.                                                                                                                                                                                                [name] Time: 3:15p-4:00p 45 minutes

## 2023-01-06 ENCOUNTER — Telehealth: Payer: Self-pay | Admitting: Cardiology

## 2023-01-06 ENCOUNTER — Encounter (HOSPITAL_COMMUNITY): Payer: Self-pay

## 2023-01-06 ENCOUNTER — Other Ambulatory Visit (HOSPITAL_COMMUNITY): Payer: Self-pay

## 2023-01-06 DIAGNOSIS — G8929 Other chronic pain: Secondary | ICD-10-CM | POA: Diagnosis not present

## 2023-01-06 DIAGNOSIS — D631 Anemia in chronic kidney disease: Secondary | ICD-10-CM | POA: Diagnosis not present

## 2023-01-06 DIAGNOSIS — D630 Anemia in neoplastic disease: Secondary | ICD-10-CM | POA: Diagnosis not present

## 2023-01-06 DIAGNOSIS — R911 Solitary pulmonary nodule: Secondary | ICD-10-CM | POA: Diagnosis not present

## 2023-01-06 DIAGNOSIS — E213 Hyperparathyroidism, unspecified: Secondary | ICD-10-CM | POA: Diagnosis not present

## 2023-01-06 DIAGNOSIS — E559 Vitamin D deficiency, unspecified: Secondary | ICD-10-CM | POA: Diagnosis not present

## 2023-01-06 DIAGNOSIS — N1831 Chronic kidney disease, stage 3a: Secondary | ICD-10-CM | POA: Diagnosis not present

## 2023-01-06 DIAGNOSIS — Z7982 Long term (current) use of aspirin: Secondary | ICD-10-CM | POA: Diagnosis not present

## 2023-01-06 DIAGNOSIS — I700 Atherosclerosis of aorta: Secondary | ICD-10-CM | POA: Diagnosis not present

## 2023-01-06 DIAGNOSIS — Z96641 Presence of right artificial hip joint: Secondary | ICD-10-CM | POA: Diagnosis not present

## 2023-01-06 DIAGNOSIS — J45909 Unspecified asthma, uncomplicated: Secondary | ICD-10-CM | POA: Diagnosis not present

## 2023-01-06 DIAGNOSIS — M5030 Other cervical disc degeneration, unspecified cervical region: Secondary | ICD-10-CM | POA: Diagnosis not present

## 2023-01-06 DIAGNOSIS — I129 Hypertensive chronic kidney disease with stage 1 through stage 4 chronic kidney disease, or unspecified chronic kidney disease: Secondary | ICD-10-CM | POA: Diagnosis not present

## 2023-01-06 DIAGNOSIS — M47812 Spondylosis without myelopathy or radiculopathy, cervical region: Secondary | ICD-10-CM | POA: Diagnosis not present

## 2023-01-06 DIAGNOSIS — H04123 Dry eye syndrome of bilateral lacrimal glands: Secondary | ICD-10-CM | POA: Diagnosis not present

## 2023-01-06 DIAGNOSIS — G43909 Migraine, unspecified, not intractable, without status migrainosus: Secondary | ICD-10-CM | POA: Diagnosis not present

## 2023-01-06 DIAGNOSIS — K589 Irritable bowel syndrome without diarrhea: Secondary | ICD-10-CM | POA: Diagnosis not present

## 2023-01-06 DIAGNOSIS — M797 Fibromyalgia: Secondary | ICD-10-CM | POA: Diagnosis not present

## 2023-01-06 DIAGNOSIS — D509 Iron deficiency anemia, unspecified: Secondary | ICD-10-CM | POA: Diagnosis not present

## 2023-01-06 DIAGNOSIS — E871 Hypo-osmolality and hyponatremia: Secondary | ICD-10-CM | POA: Diagnosis not present

## 2023-01-06 DIAGNOSIS — N200 Calculus of kidney: Secondary | ICD-10-CM | POA: Diagnosis not present
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
## 2023-01-06 NOTE — Telephone Encounter (Signed)
Jim with patient at house for PT , her BP 180/100.  She is asymptomatic.  She had missed her noon dose of hydralazine, took this while he was there and  20 minutes after Bp 167/89.  She was also doing PT and pain level was 6/10 with PT  Advised Repeat BP in one hour. If starts to increase then she should call after hours or go to ED if increasing and symptomatic. Please advise any other recommendations

## 2023-01-06 NOTE — Telephone Encounter (Signed)
Pt c/o BP issue: STAT if pt c/o blurred vision, one-sided weakness or slurred speech  1. What are your last 5 BP readings? 167/89, 142/84,128/73,131/75,155/80  2. Are you having any other symptoms (ex. Dizziness, headache, blurred vision, passed out)? Headache, dizziness   3. What is your BP issue? Pt states their BP is going all over the place and are wanting to speak with the nurse about it. Please advise

## 2023-01-07 ENCOUNTER — Other Ambulatory Visit (HOSPITAL_COMMUNITY): Payer: Self-pay

## 2023-01-07 ENCOUNTER — Other Ambulatory Visit: Payer: Self-pay

## 2023-01-07 ENCOUNTER — Telehealth: Payer: Self-pay | Admitting: Cardiology

## 2023-01-07 ENCOUNTER — Ambulatory Visit: Payer: Self-pay

## 2023-01-07 ENCOUNTER — Telehealth: Payer: Self-pay | Admitting: Internal Medicine

## 2023-01-07 LAB — ATN PROFILE
A -- Beta-amyloid 42/40 Ratio: 0.114 (ref >0.102)
Beta-amyloid 40: 278.96 pg/mL
Beta-amyloid 42: 31.77 pg/mL
N -- NfL, Plasma: 4.58 pg/mL (ref 0.00–7.64)
T -- p-tau181: 1.9 pg/mL — ABNORMAL HIGH (ref 0.00–0.97)

## 2023-01-07 MED ORDER — D-MANNOSE 500 MG PO CAPS
500.0000 mg | ORAL_CAPSULE | Freq: Two times a day (BID) | ORAL | 3 refills | Status: DC
Start: 2023-01-07 — End: 2024-01-24
  Filled 2023-01-07 – 2023-04-26 (×5): qty 180, fill #0
  Filled 2023-04-26: qty 120, 60d supply, fill #0
  Filled 2023-07-07 – 2023-07-12 (×2): qty 60, 30d supply, fill #0
  Filled 2023-07-15 (×2): qty 120, 60d supply, fill #0
  Filled 2023-07-21 – 2023-09-27 (×5): qty 120, 60d supply, fill #1
  Filled 2023-10-26 – 2023-11-15 (×3): qty 120, 60d supply, fill #2
  Filled 2023-11-17: qty 120, 60d supply, fill #3

## 2023-01-07 MED ORDER — D3 5000 125 MCG (5000 UT) PO CAPS
5000.0000 [IU] | ORAL_CAPSULE | Freq: Every day | ORAL | 3 refills | Status: DC
Start: 2023-01-07 — End: 2024-01-24
  Filled 2023-01-07: qty 100, 100d supply, fill #0
  Filled 2023-01-24 – 2023-03-17 (×4): qty 30, 30d supply, fill #0
  Filled 2023-04-18: qty 30, 30d supply, fill #1
  Filled 2023-05-04 – 2023-05-13 (×3): qty 30, 30d supply, fill #2
  Filled 2023-06-01 – 2023-06-15 (×5): qty 30, 30d supply, fill #3
  Filled 2023-06-27 – 2023-07-15 (×3): qty 30, 30d supply, fill #4
  Filled 2023-07-21 – 2023-08-08 (×3): qty 30, 30d supply, fill #5
  Filled 2023-08-31 – 2023-09-13 (×5): qty 30, 30d supply, fill #6
  Filled 2023-10-03 – 2023-10-06 (×2): qty 30, 30d supply, fill #7
  Filled 2023-10-26 – 2023-11-15 (×3): qty 30, 30d supply, fill #8
  Filled 2023-11-17 – 2023-12-08 (×3): qty 30, 30d supply, fill #9
  Filled 2024-01-05 – 2024-01-06 (×2): qty 30, 30d supply, fill #10

## 2023-01-07 MED ORDER — CARVEDILOL 12.5 MG PO TABS: 12.5000 mg | ORAL_TABLET | Freq: Two times a day (BID) | ORAL | 0 refills | Status: DC

## 2023-01-07 MED ORDER — SALINE SPRAY 0.65 % NA SOLN
1.0000 | NASAL | 1 refills | Status: DC | PRN
  Filled 2023-01-07: qty 44, fill #0
  Filled 2023-08-01: qty 44, 30d supply, fill #0
  Filled 2023-11-17: qty 44, 30d supply, fill #1

## 2023-01-07 MED ORDER — VITAMIN C 500 MG PO TABS
500.0000 mg | ORAL_TABLET | Freq: Every day | ORAL | 3 refills | Status: DC
Start: 2023-01-07 — End: 2024-01-24
  Filled 2023-01-07: qty 100, 100d supply, fill #0
  Filled 2023-01-24 – 2023-03-17 (×4): qty 30, 30d supply, fill #0
  Filled 2023-04-18: qty 30, 30d supply, fill #1
  Filled 2023-05-04 – 2023-05-13 (×3): qty 30, 30d supply, fill #2
  Filled 2023-06-01 – 2023-06-15 (×5): qty 30, 30d supply, fill #3
  Filled 2023-06-27 – 2023-07-15 (×3): qty 30, 30d supply, fill #4
  Filled 2023-07-21 – 2023-08-08 (×3): qty 30, 30d supply, fill #5
  Filled 2023-08-31 – 2023-09-13 (×5): qty 30, 30d supply, fill #6
  Filled 2023-10-03 – 2023-10-06 (×2): qty 30, 30d supply, fill #7
  Filled 2023-10-26 – 2023-11-15 (×3): qty 30, 30d supply, fill #8
  Filled 2023-11-17 – 2023-12-08 (×3): qty 30, 30d supply, fill #9
  Filled 2024-01-05 – 2024-01-06 (×2): qty 30, 30d supply, fill #10

## 2023-01-07 MED ORDER — VITAMIN B-12 1000 MCG PO TABS
1000.0000 ug | ORAL_TABLET | Freq: Every day | ORAL | 3 refills | Status: DC
Start: 2023-01-07 — End: 2024-01-24
  Filled 2023-01-07 – 2023-01-27 (×3): qty 100, 100d supply, fill #0
  Filled 2023-03-10 – 2023-03-17 (×2): qty 30, 30d supply, fill #0
  Filled 2023-04-18: qty 30, 30d supply, fill #1
  Filled 2023-05-04 – 2023-05-13 (×3): qty 30, 30d supply, fill #2
  Filled 2023-06-01 – 2023-06-15 (×5): qty 30, 30d supply, fill #3
  Filled 2023-06-27 – 2023-07-15 (×3): qty 30, 30d supply, fill #4
  Filled 2023-07-21 – 2023-08-08 (×3): qty 30, 30d supply, fill #5
  Filled 2023-08-31 – 2023-09-13 (×6): qty 30, 30d supply, fill #6
  Filled 2023-10-03 – 2023-10-06 (×2): qty 30, 30d supply, fill #7
  Filled 2023-10-26 – 2023-11-15 (×3): qty 30, 30d supply, fill #8
  Filled 2023-11-17 – 2023-12-08 (×3): qty 30, 30d supply, fill #9
  Filled 2024-01-05 – 2024-01-06 (×2): qty 30, 30d supply, fill #10

## 2023-01-07 MED ORDER — ACETAMINOPHEN 500 MG PO TABS
1000.0000 mg | ORAL_TABLET | Freq: Four times a day (QID) | ORAL | 2 refills | Status: DC | PRN
  Filled 2023-01-07: qty 30, 4d supply, fill #0

## 2023-01-07 MED ORDER — POLYSACCHARIDE IRON COMPLEX 150 MG PO CAPS
150.0000 mg | ORAL_CAPSULE | Freq: Every day | ORAL | 3 refills | Status: DC
  Filled 2023-01-07: qty 90, 90d supply, fill #0
  Filled 2023-01-24 – 2023-03-21 (×5): qty 30, 30d supply, fill #0
  Filled 2023-03-21: qty 90, 90d supply, fill #0
  Filled 2023-04-18: qty 30, 30d supply, fill #1
  Filled 2023-05-04 – 2023-05-13 (×3): qty 30, 30d supply, fill #2
  Filled 2023-06-01 – 2023-06-15 (×4): qty 30, 30d supply, fill #3
  Filled 2023-06-27 – 2023-07-15 (×3): qty 30, 30d supply, fill #4
  Filled 2023-07-21 – 2023-08-08 (×3): qty 30, 30d supply, fill #5
  Filled 2023-08-31 – 2023-09-13 (×5): qty 30, 30d supply, fill #6
  Filled 2023-10-03 – 2023-10-06 (×2): qty 30, 30d supply, fill #7
  Filled 2023-10-26 – 2023-11-15 (×3): qty 30, 30d supply, fill #8
  Filled 2023-11-17 – 2023-12-08 (×3): qty 30, 30d supply, fill #9
  Filled 2024-01-05 – 2024-01-06 (×2): qty 30, 30d supply, fill #10

## 2023-01-07 MED ORDER — LOPERAMIDE HCL 2 MG PO TABS
4.0000 mg | ORAL_TABLET | Freq: Four times a day (QID) | ORAL | 1 refills | Status: DC | PRN
Start: 2023-01-07 — End: 2023-01-18
  Filled 2023-01-07: qty 48, 6d supply, fill #0

## 2023-01-07 MED ORDER — SYSTANE 0.4-0.3 % OP SOLN
1.0000 [drp] | Freq: Three times a day (TID) | OPHTHALMIC | 2 refills | Status: DC | PRN
  Filled 2023-01-07: qty 3, fill #0
  Filled 2023-08-01: qty 15, 30d supply, fill #0
  Filled 2023-11-17: qty 15, 30d supply, fill #1

## 2023-01-07 MED ORDER — METAMUCIL FIBER 2 G PO CHEW
2.0000 | CHEWABLE_TABLET | Freq: Two times a day (BID) | ORAL | 11 refills | Status: DC
Start: 2023-01-07 — End: 2024-01-24
  Filled 2023-01-07 – 2023-03-10 (×4): qty 100, fill #0
  Filled 2023-07-07: qty 120, 30d supply, fill #0
  Filled 2023-07-12: qty 60, 30d supply, fill #0
  Filled 2023-07-15 (×2): qty 72, 36d supply, fill #0
  Filled 2023-07-21 – 2023-08-09 (×4): qty 72, 36d supply, fill #1
  Filled 2023-08-31 – 2023-09-02 (×3): qty 72, 36d supply, fill #2
  Filled 2023-10-26 – 2023-11-15 (×3): qty 72, 36d supply, fill #3
  Filled 2023-11-17 – 2023-12-14 (×3): qty 72, 36d supply, fill #4
  Filled 2024-01-05 – 2024-01-06 (×2): qty 72, 36d supply, fill #5

## 2023-01-07 MED ORDER — ACIDOPHILUS PO CAPS
1.0000 | ORAL_CAPSULE | Freq: Every day | ORAL | 3 refills | Status: DC
Start: 2023-01-07 — End: 2024-01-24
  Filled 2023-01-07 – 2023-03-10 (×4): qty 100, 100d supply, fill #0
  Filled 2023-04-26: qty 50, 50d supply, fill #0
  Filled 2023-04-26: qty 100, 100d supply, fill #0
  Filled 2023-07-07 – 2023-07-15 (×3): qty 30, 30d supply, fill #0
  Filled 2023-07-21: qty 30, 30d supply, fill #1
  Filled 2023-08-08: qty 50, 50d supply, fill #1
  Filled 2023-08-08 – 2023-08-09 (×2): qty 30, 30d supply, fill #1
  Filled 2023-08-31 – 2023-09-13 (×5): qty 30, 30d supply, fill #2
  Filled 2023-09-26: qty 30, 30d supply, fill #3
  Filled 2023-10-26 – 2023-11-15 (×3): qty 30, 30d supply, fill #4
  Filled 2023-11-17 – 2023-12-08 (×4): qty 30, 30d supply, fill #5
  Filled 2024-01-05 – 2024-01-06 (×2): qty 30, 30d supply, fill #6

## 2023-01-07 NOTE — Telephone Encounter (Signed)
*  STAT* If patient is at the pharmacy, call can be transferred to refill team.   1. Which medications need to be refilled? (please list name of each medication and dose if known)   carvedilol (COREG) 12.5 MG tablet (Expired)   2. Would you like to learn more about the convenience, safety, & potential cost savings by using the [facility]?   3. Are you open to using the [facility] (Type [facility]. ).  4. Which pharmacy/location (including street and city if local pharmacy) is medication to be sent to?  [id number] - [place], [place] - [address]   5. Do they need a 30 day or 90 day supply?   90 day   Patient stated she is completely out of this medication.  Patient has appointment scheduled on 9/17.

## 2023-01-07 NOTE — Telephone Encounter (Signed)
Okay prepackaged meds.  Thanks

## 2023-01-07 NOTE — Telephone Encounter (Signed)
Refills sent

## 2023-01-07 NOTE — Patient Outreach (Signed)
  Care Coordination   Follow Up Visit Note   01/07/2023 Name: [name] MRN: [id number] DOB: April 12, 1951  [name] is a 72 y.o. year old female who sees Plotnikov, Georgina Quint, MD for primary care. I spoke with  [name] by phone today.  What matters to the patients health and wellness today?  Patient expresses frustration regarding medications. She states she was expecting her packaged medications to be delivered today. She also reports dosing for carvedilol is 12.5 mg bid, but [facility] 6.25 [facility]. She reports the home health nurse came to help her set up her pills today and she has about 1 1/2 weeks medications left. She expressed she is scheduled to have hip surgery on the 29 th and likes to be prepared.  Goals Addressed             This Visit's Progress    Assist with Health managment       Interventions Today    Flowsheet Row Most Recent Value  Chronic Disease   Chronic disease during today's visit Hypertension (HTN)  General Interventions   General Interventions Discussed/Reviewed Communication with  Communication with Pharmacists  [communication with clinical pharmacist to collaborate on patient concerns regarding medications]  Education Interventions   Provided Verbal Education On --  [advised to continue to take medications as prescribed, attend provider visits as scheduled]  Pharmacy Interventions   Pharmacy Dicussed/Reviewed Pharmacy Topics Discussed  Safety Interventions   Safety Discussed/Reviewed Safety Reviewed            SDOH assessments and interventions completed:  No  Care Coordination Interventions:  Yes, provided   Follow up plan: Follow up call scheduled for 02/07/23    Encounter Outcome:  Pt. Visit Completed   Kathyrn Sheriff, RN, MSN, BSN, CCM Care Management Coordinator [phone]

## 2023-01-07 NOTE — Telephone Encounter (Signed)
April from [facility] called needing all medication pre package and they need the approval from the Dr. to do so. April also had a question about a bp medication for the pt. Please advise.   Best call back [phone]

## 2023-01-07 NOTE — Patient Instructions (Signed)
Visit Information  Thank you for taking time to visit with me today. Please don't hesitate to contact me if I can be of assistance to you.   Following are the goals we discussed today:  Continue to take medications as prescribed. Continue to attend provider visits as scheduled Continue to contact provider with health questions or concerns Clinical pharmacist will contact you regarding medication questions  Our next appointment is by telephone on 02/07/23 at 11:30 am  Please call the care guide team at [phone] if you need to cancel or reschedule your appointment.   If you are experiencing a Mental Health or Behavioral Health Crisis or need someone to talk to, please call the Suicide and Crisis Lifeline: 988 call the [place] National Suicide Prevention Lifeline: 404-652-2318 or TTY: 848 558 2473 TTY 2185564911) to talk to a trained counselor call 1-800-273-TALK (toll free, 24 hour hotline)  Kathyrn Sheriff, RN, MSN, BSN, CCM Care Management Coordinator [phone]

## 2023-01-08 ENCOUNTER — Other Ambulatory Visit (HOSPITAL_COMMUNITY): Payer: Self-pay

## 2023-01-10 ENCOUNTER — Ambulatory Visit (INDEPENDENT_AMBULATORY_CARE_PROVIDER_SITE_OTHER): Payer: 59 | Admitting: Psychology

## 2023-01-10 ENCOUNTER — Other Ambulatory Visit (HOSPITAL_COMMUNITY): Payer: Self-pay

## 2023-01-10 DIAGNOSIS — F4323 Adjustment disorder with mixed anxiety and depressed mood: Secondary | ICD-10-CM

## 2023-01-10 DIAGNOSIS — F4321 Adjustment disorder with depressed mood: Secondary | ICD-10-CM

## 2023-01-10 NOTE — Progress Notes (Signed)
[name] Time: 4:10p-5:00 p50 minutes   [name] is a 72 y.o. 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 (Status: maintained) -- No Description Entered  Diminished interest in or enjoyment of activities. (Status: maintained) -- No Description Entered  Lack of energy. (Status: maintained) -- No Description Entered  Sad affect, social withdrawal, anxiety, loss of interest in activities, and low energy. (Status: maintained) -- No Description Entered  Medication Status compliance  Safety unspecified If Suicidal or Homicidal State Action Taken: unspecified  Current Risk:  Medications Buspar (Dosage: 30mg  2X/day)  New Medication (Dosage: 250mg  2x/day)  New Medication (Dosage: 250mg  2x/day)  Prozac (Dosage: 40mg  daily)  Remeron (Dosage: 30mg  nightly)  xanax (Dosage: .5mg  2-3X/day)  Objectives Related Problem: Recognize, accept, and cope with feelings of depression. Description: Identify and replace thoughts and beliefs that support depression. Target Date: 2023-04-29 Frequency: Daily Modality: individual Progress: 75%  Related Problem: Recognize, accept, and cope with feelings of depression. Description: Verbalize an understanding of healthy and unhealthy emotions with the intent of increasing the use of healthy emotions to guide actions. Target Date: 2023-04-29 Frequency: Daily Modality: individual Progress: 85%  Related Problem: Recognize, accept, and cope with feelings of depression. Description: Increasingly verbalize hopeful and positive statements regarding self, others, and the future. Target Date: 2022-04-28 Frequency: Daily Modality: individual Progress: 100%  Related Problem:  Reduce fear, anxiety, and worry associated with the medical condition. Description: Share with significant others efforts to adapt successfully to the medical condition/diagnosis. Target Date: 2023-04-29 Frequency: Daily Modality: individual Progress: 85%  Related Problem: Reduce fear, anxiety, and worry associated with the medical condition. Description: Engage in social, productive, and recreational activities that are possible in spite of medical condition. Target Date: 2023-04-29 Frequency: Daily Modality: individual Progress: 90%  Related Problem: Reduce fear, anxiety, and worry associated with the medical condition. Description: Identify the coping skills and sources of emotional support that have been beneficial in the past. Target Date: 2023-04-29 Frequency: Daily Modality: individual Progress: 80%  Client Response full compliance  Service Location Location, [address] [place], [place] [address]  Service Code cpt [phone]  Related past to present  Facilitate problem solving  Identify/label emotions  Validate/empathize  Self care activities  Lifestyle change (exercise, nutrition)  Assess/facilitate readiness to change  Relaxation training  Rationally challenge thoughts or beliefs/cognitive restructuring  Comments  Dx: F43.21  Meds: Prozac, [name], patient is struggling to rebuild her life and adjust to her new circumstances. She suffers a negative self-concept and has little confidence in her judgement. She is seeking to reduce symptoms of anxiety and self-doubt and to re-define satisfa ction moving forward. Therapy will focus on cognitive restructuring to redefine her experiences and focus on those satisfying aspects of her life. Will target this goal completion for April1, 2021 (completed). She is also wanting to utilize counseling to adjust and accept her latest diagnosis of cancer. She wants assistance  to better manage this news and  remain positive throughout treatment. Goal date is 12-22. Patient has made progress, but her medical condition is variable and she requests additional treatment to manage moods throughout her medical ordeal. In addition, she is trying to manage feelings associated with her older son's mental health struggles. Revised goal date is 12-24.  Patient agrees to have a video Public affairs consultant) session and is aware of the limitations of this platform. She is at home and I am in office.   [name] states she feels she is "close to a breakdown". Is scheduled for hip replacement at [facility] on Thursday morning. She is confident in doctor, but has become anxious in the last few days. She called the [phone] number this morning as she is feeling that she is losing the ability to take care of herself. She is feeling especially sad as hospice has been called in for her dad. She feels her current distress is mostly related to her anxiety associated with the upcoming surgery. We also discussed her visit to the neurologist and some of the cognitive struggles she is experiencing. Told her they have requested her records from [facility]. She prefer that I not send those records. Rather we will consider a referral to a neuropsychologist.                                                                                                                                                                                                 [name] Time: 3:15p-4:00p 45 minutes

## 2023-01-11 ENCOUNTER — Telehealth: Payer: Self-pay | Admitting: Internal Medicine

## 2023-01-11 ENCOUNTER — Other Ambulatory Visit: Payer: Self-pay

## 2023-01-11 ENCOUNTER — Telehealth: Payer: Self-pay

## 2023-01-11 ENCOUNTER — Other Ambulatory Visit (HOSPITAL_COMMUNITY): Payer: Self-pay

## 2023-01-11 ENCOUNTER — Ambulatory Visit: Payer: 59 | Admitting: Internal Medicine

## 2023-01-11 DIAGNOSIS — G43909 Migraine, unspecified, not intractable, without status migrainosus: Secondary | ICD-10-CM | POA: Diagnosis not present

## 2023-01-11 DIAGNOSIS — M797 Fibromyalgia: Secondary | ICD-10-CM | POA: Diagnosis not present

## 2023-01-11 DIAGNOSIS — M5030 Other cervical disc degeneration, unspecified cervical region: Secondary | ICD-10-CM | POA: Diagnosis not present

## 2023-01-11 DIAGNOSIS — J45909 Unspecified asthma, uncomplicated: Secondary | ICD-10-CM | POA: Diagnosis not present

## 2023-01-11 DIAGNOSIS — M47812 Spondylosis without myelopathy or radiculopathy, cervical region: Secondary | ICD-10-CM | POA: Diagnosis not present

## 2023-01-11 DIAGNOSIS — I10 Essential (primary) hypertension: Secondary | ICD-10-CM

## 2023-01-11 DIAGNOSIS — N200 Calculus of kidney: Secondary | ICD-10-CM | POA: Diagnosis not present

## 2023-01-11 DIAGNOSIS — E559 Vitamin D deficiency, unspecified: Secondary | ICD-10-CM | POA: Diagnosis not present

## 2023-01-11 DIAGNOSIS — D630 Anemia in neoplastic disease: Secondary | ICD-10-CM | POA: Diagnosis not present

## 2023-01-11 DIAGNOSIS — R911 Solitary pulmonary nodule: Secondary | ICD-10-CM | POA: Diagnosis not present

## 2023-01-11 DIAGNOSIS — I700 Atherosclerosis of aorta: Secondary | ICD-10-CM | POA: Diagnosis not present

## 2023-01-11 DIAGNOSIS — E213 Hyperparathyroidism, unspecified: Secondary | ICD-10-CM | POA: Diagnosis not present

## 2023-01-11 DIAGNOSIS — I129 Hypertensive chronic kidney disease with stage 1 through stage 4 chronic kidney disease, or unspecified chronic kidney disease: Secondary | ICD-10-CM | POA: Diagnosis not present

## 2023-01-11 DIAGNOSIS — Z96641 Presence of right artificial hip joint: Secondary | ICD-10-CM | POA: Diagnosis not present

## 2023-01-11 DIAGNOSIS — D509 Iron deficiency anemia, unspecified: Secondary | ICD-10-CM | POA: Diagnosis not present

## 2023-01-11 DIAGNOSIS — G8929 Other chronic pain: Secondary | ICD-10-CM | POA: Diagnosis not present

## 2023-01-11 DIAGNOSIS — H04123 Dry eye syndrome of bilateral lacrimal glands: Secondary | ICD-10-CM | POA: Diagnosis not present

## 2023-01-11 DIAGNOSIS — K589 Irritable bowel syndrome without diarrhea: Secondary | ICD-10-CM | POA: Diagnosis not present

## 2023-01-11 DIAGNOSIS — N1831 Chronic kidney disease, stage 3a: Secondary | ICD-10-CM | POA: Diagnosis not present

## 2023-01-11 DIAGNOSIS — Z7982 Long term (current) use of aspirin: Secondary | ICD-10-CM | POA: Diagnosis not present

## 2023-01-11 DIAGNOSIS — D631 Anemia in chronic kidney disease: Secondary | ICD-10-CM | POA: Diagnosis not present

## 2023-01-11 DIAGNOSIS — E871 Hypo-osmolality and hyponatremia: Secondary | ICD-10-CM | POA: Diagnosis not present

## 2023-01-11 DIAGNOSIS — Z79899 Other long term (current) drug therapy: Secondary | ICD-10-CM

## 2023-01-11 MED ORDER — ROSUVASTATIN CALCIUM 10 MG PO TABS
10.0000 mg | ORAL_TABLET | Freq: Every day | ORAL | 2 refills | Status: DC
  Filled 2023-01-11 – 2023-02-14 (×5): qty 30, 30d supply, fill #0
  Filled 2023-03-10 – 2023-03-17 (×2): qty 30, 30d supply, fill #1
  Filled 2023-04-18: qty 30, 30d supply, fill #2

## 2023-01-11 MED ORDER — CARVEDILOL 12.5 MG PO TABS
12.5000 mg | ORAL_TABLET | Freq: Two times a day (BID) | ORAL | 2 refills | Status: DC
  Filled 2023-01-11 – 2023-01-31 (×6): qty 60, 30d supply, fill #0
  Filled 2023-01-31 (×2): qty 30, 15d supply, fill #0
  Filled 2023-03-17: qty 30, 15d supply, fill #1
  Filled 2023-04-18 – 2023-04-27 (×3): qty 60, 30d supply, fill #1
  Filled 2023-05-04 – 2023-05-23 (×5): qty 60, 30d supply, fill #2

## 2023-01-11 MED ORDER — CLONIDINE HCL 0.2 MG PO TABS
0.2000 mg | ORAL_TABLET | Freq: Every day | ORAL | 2 refills | Status: DC
Start: 2023-01-11 — End: 2023-06-27
  Filled 2023-01-11 – 2023-02-14 (×5): qty 30, 30d supply, fill #0
  Filled 2023-03-17 – 2023-05-23 (×8): qty 30, 30d supply, fill #1
  Filled 2023-06-01 – 2023-06-15 (×7): qty 30, 30d supply, fill #2

## 2023-01-11 MED ORDER — LOSARTAN POTASSIUM 50 MG PO TABS
50.0000 mg | ORAL_TABLET | Freq: Every day | ORAL | 2 refills | Status: DC
  Filled 2023-01-11: qty 30, 30d supply, fill #0
  Filled 2023-01-31: qty 15, 15d supply, fill #0
  Filled 2023-01-31 – 2023-03-17 (×4): qty 30, 30d supply, fill #0

## 2023-01-11 NOTE — Telephone Encounter (Signed)
Frances Furbish called asking if patient prescription for hydralazine be re-written so she can take 2 tablets 3 times a day, and send it to [facility] on [address].

## 2023-01-11 NOTE — Telephone Encounter (Signed)
Frances Furbish called asking if patient prescription for hydralazine be re-written for patient to take 2 tablets 3 times a day and [facility] [address].

## 2023-01-11 NOTE — Anesthesia Preprocedure Evaluation (Signed)
Anesthesia Evaluation  Patient identified by MRN, date of birth, ID band Patient awake    Reviewed: Allergy & Precautions, NPO status , Patient's Chart, lab work & pertinent test results  Airway Mallampati: II  TM Distance: >3 FB Neck ROM: Full    Dental no notable dental hx.    Pulmonary asthma , COPD   Pulmonary exam normal        Cardiovascular hypertension, Pt. on medications and Pt. on home beta blockers + DVT   Rhythm:Regular Rate:Normal     Neuro/Psych  Headaches  Anxiety Depression Bipolar Disorder      GI/Hepatic Neg liver ROS,GERD  Medicated,,  Endo/Other  Hypothyroidism    Renal/GU CRFRenal disease  negative genitourinary   Musculoskeletal  (+) Arthritis ,  Fibromyalgia -  Abdominal Normal abdominal exam  (+)   Peds  Hematology  (+) Blood dyscrasia, anemia Lab Results      Component                Value               Date                      WBC                      6.9                 01/04/2023                HGB                      11.5 (L)            01/04/2023                HCT                      35.1 (L)            01/04/2023                MCV                      96.2                01/04/2023                PLT                      305                 01/04/2023             Lab Results      Component                Value               Date                      NA                       140                 01/04/2023                K  4.1                 01/04/2023                CO2                      22                  01/04/2023                GLUCOSE                  93                  01/04/2023                BUN                      16                  01/04/2023                CREATININE               1.13 (H)            01/04/2023                CALCIUM                  8.6 (L)             01/04/2023                GFR                      47.45 (L)            12/21/2022                EGFR                     48 (L)              10/29/2022                GFRNONAA                 52 (L)              01/04/2023              Anesthesia Other Findings   Reproductive/Obstetrics                             Anesthesia Physical Anesthesia Plan  ASA: 2  Anesthesia Plan: MAC and Spinal   Post-op Pain Management: Celebrex PO (pre-op)* and Tylenol PO (pre-op)*   Induction:   PONV Risk Score and Plan: 2 and Ondansetron, Dexamethasone, Propofol infusion and Treatment may vary due to age or medical condition  Airway Management Planned: Simple Face Mask and Nasal Cannula  Additional Equipment: None  Intra-op Plan:   Post-operative Plan:   Informed Consent: I have reviewed the patients History and Physical, chart, labs and discussed the procedure including the risks, benefits and alternatives for the proposed anesthesia with the patient or authorized representative who has indicated his/her understanding and acceptance.     Dental advisory given  Plan Discussed with: CRNA  Anesthesia Plan  Comments: (See PAT note from 8/20 by Sherlie Ban PA-C )        Anesthesia Quick Evaluation

## 2023-01-11 NOTE — Progress Notes (Unsigned)
   01/11/2023  Patient ID: [name], female   DOB: 04-03-1951, 72 y.o.   MRN: [id number]  Telephone encounter to follow-up on transferring of medications to [facility] for adherence packaging and home delivery.  -Patient was supposed to see Dr. Posey Rea at 2pm today but had a scheduling conflict with an ortho appointment prior to surgery Wednesday- had to reschedule till 9/5 -Dr. Servando Salina wants patient to have an appt before changing hydralazine directions- I made patient aware and she plans to see if she can be seen prior to November appointment -Patient went ahead and got carvedilol 12.5 BID,  and [facility]mg  [facility] due to concern with running low and upcoming surgical procedure.  They also filled hydralazine with directions of 20mg  TID.  -Hip replacement 8/29- will stay 1-2 nights in hospital then go to stay with aunt and uncle a few days -She is okay on all medications for now -[facility] nurse that has been assisting with filling pill box will come out again next week, but no more after that -Contacted Pharm[facility] that has been assisting with set up for adherence packaging and mail order, and they have requested [facility] with no response  -Drs Posey Rea and Tobb went ahead and sent new orders to [facility], but transfers for refill(s) on alprazolam and tramadol will need [facility].  I have made [facility] aware -Following up with patient at the end of next week after she sees Dr. Posey Rea to make sure no medication changes are made and things are on track for adherence packaging and home delivery  Lenna Gilford, PharmD, DPLA

## 2023-01-12 ENCOUNTER — Ambulatory Visit: Payer: Self-pay | Admitting: Student

## 2023-01-12 ENCOUNTER — Other Ambulatory Visit: Payer: 59

## 2023-01-12 ENCOUNTER — Other Ambulatory Visit (HOSPITAL_COMMUNITY): Payer: Self-pay

## 2023-01-12 ENCOUNTER — Ambulatory Visit: Payer: Self-pay

## 2023-01-12 MED ORDER — ESOMEPRAZOLE MAGNESIUM 40 MG PO CPDR: 40.0000 mg | DELAYED_RELEASE_CAPSULE | Freq: Every morning | ORAL | 0 refills | Status: DC

## 2023-01-12 MED ORDER — FLUOXETINE HCL 40 MG PO CAPS: 40.0000 mg | ORAL_CAPSULE | Freq: Every morning | ORAL | 0 refills | Status: DC

## 2023-01-12 MED ORDER — COLESEVELAM HCL 625 MG PO TABS
1250.0000 mg | ORAL_TABLET | Freq: Two times a day (BID) | ORAL | 11 refills | Status: DC
  Filled 2023-01-12 – 2023-02-16 (×6): qty 120, 30d supply, fill #0
  Filled 2023-03-10 – 2023-05-23 (×8): qty 120, 30d supply, fill #1
  Filled 2023-06-01 – 2023-06-15 (×7): qty 120, 30d supply, fill #2
  Filled 2023-06-27 – 2023-07-14 (×4): qty 120, 30d supply, fill #3

## 2023-01-12 MED ORDER — LEVOTHYROXINE SODIUM 100 MCG PO TABS
100.0000 ug | ORAL_TABLET | Freq: Every day | ORAL | 11 refills | Status: DC
  Filled 2023-01-12: qty 30, 30d supply, fill #0
  Filled 2023-02-14 – 2023-04-18 (×6): qty 30, 30d supply, fill #1
  Filled 2023-05-04 – 2023-05-13 (×3): qty 30, 30d supply, fill #2
  Filled 2023-06-01 – 2023-06-15 (×5): qty 30, 30d supply, fill #3
  Filled 2023-06-27 – 2023-07-15 (×3): qty 30, 30d supply, fill #4
  Filled 2023-07-21 – 2023-08-08 (×3): qty 30, 30d supply, fill #5
  Filled 2023-08-31 – 2023-09-13 (×5): qty 30, 30d supply, fill #6
  Filled 2023-10-03 – 2023-10-06 (×2): qty 30, 30d supply, fill #7
  Filled 2023-10-26 – 2023-11-15 (×3): qty 30, 30d supply, fill #8
  Filled 2023-11-17 – 2023-12-08 (×3): qty 30, 30d supply, fill #9
  Filled 2024-01-05 – 2024-01-06 (×2): qty 30, 30d supply, fill #10

## 2023-01-12 MED ORDER — OMEGA-3-ACID ETHYL ESTERS 1 G PO CAPS
2.0000 | ORAL_CAPSULE | Freq: Two times a day (BID) | ORAL | 11 refills | Status: DC
  Filled 2023-01-12 – 2023-03-17 (×6): qty 120, 30d supply, fill #0
  Filled 2023-04-18: qty 120, 30d supply, fill #1
  Filled 2023-05-04 – 2023-05-13 (×3): qty 120, 30d supply, fill #2
  Filled 2023-06-01 – 2023-06-15 (×5): qty 120, 30d supply, fill #3
  Filled 2023-06-27 – 2023-07-15 (×3): qty 120, 30d supply, fill #4
  Filled 2023-07-21 – 2023-08-08 (×3): qty 120, 30d supply, fill #5
  Filled 2023-08-31 – 2023-09-13 (×5): qty 120, 30d supply, fill #6
  Filled 2023-10-03 – 2023-10-06 (×2): qty 120, 30d supply, fill #7
  Filled 2023-10-26 – 2023-11-15 (×3): qty 120, 30d supply, fill #8
  Filled 2023-11-17 – 2023-12-08 (×3): qty 120, 30d supply, fill #9
  Filled 2024-01-05 – 2024-01-06 (×2): qty 120, 30d supply, fill #10

## 2023-01-12 MED ORDER — CARVEDILOL 6.25 MG PO TABS: 6.2500 mg | ORAL_TABLET | Freq: Two times a day (BID) | ORAL | 3 refills | Status: DC

## 2023-01-12 MED ORDER — FLUOXETINE HCL 40 MG PO CAPS
40.0000 mg | ORAL_CAPSULE | Freq: Every morning | ORAL | 11 refills | Status: DC
  Filled 2023-01-12 – 2023-04-18 (×9): qty 30, 30d supply, fill #0
  Filled 2023-05-04 – 2023-05-13 (×3): qty 30, 30d supply, fill #1
  Filled 2023-06-01 – 2023-06-15 (×5): qty 30, 30d supply, fill #2
  Filled 2023-06-27 – 2023-07-14 (×3): qty 30, 30d supply, fill #3

## 2023-01-12 MED ORDER — HYDRALAZINE HCL 10 MG PO TABS: 10.0000 mg | ORAL_TABLET | Freq: Four times a day (QID) | ORAL | 3 refills | Status: DC | PRN

## 2023-01-12 MED ORDER — MIRTAZAPINE 30 MG PO TABS: 30.0000 mg | ORAL_TABLET | Freq: Every evening | ORAL | 0 refills | Status: DC

## 2023-01-12 MED ORDER — BUSPIRONE HCL 30 MG PO TABS
30.0000 mg | ORAL_TABLET | Freq: Two times a day (BID) | ORAL | 3 refills | Status: DC
  Filled 2023-01-12 – 2023-01-31 (×4): qty 60, 30d supply, fill #0
  Filled 2023-01-31: qty 30, 15d supply, fill #0
  Filled 2023-02-14: qty 60, 30d supply, fill #0
  Filled 2023-03-10 – 2023-03-17 (×2): qty 60, 30d supply, fill #1
  Filled 2023-04-18: qty 60, 30d supply, fill #2
  Filled 2023-05-04 – 2023-05-13 (×3): qty 60, 30d supply, fill #3

## 2023-01-12 MED ORDER — LOSARTAN POTASSIUM 50 MG PO TABS: 50.0000 mg | ORAL_TABLET | Freq: Every day | ORAL | 2 refills | Status: DC

## 2023-01-12 MED ORDER — ASPIRIN 81 MG PO TBEC: 81.0000 mg | DELAYED_RELEASE_TABLET | Freq: Every day | ORAL | 11 refills | Status: DC

## 2023-01-12 MED ORDER — ESOMEPRAZOLE MAGNESIUM 40 MG PO CPDR
40.0000 mg | DELAYED_RELEASE_CAPSULE | Freq: Every morning | ORAL | 3 refills | Status: DC
Start: 2023-01-12 — End: 2023-07-21
  Filled 2023-01-12 – 2023-01-27 (×3): qty 30, 30d supply, fill #0
  Filled 2023-01-31: qty 15, 15d supply, fill #0
  Filled 2023-01-31: qty 30, 30d supply, fill #0
  Filled 2023-01-31: qty 15, 15d supply, fill #0
  Filled 2023-01-31: qty 30, 30d supply, fill #0
  Filled 2023-02-14 – 2023-03-10 (×3): qty 30, 30d supply, fill #1
  Filled 2023-03-11 – 2023-03-17 (×2): qty 15, 15d supply, fill #1
  Filled 2023-04-18: qty 30, 30d supply, fill #1
  Filled 2023-05-04 – 2023-05-12 (×2): qty 15, 15d supply, fill #1
  Filled 2023-05-13: qty 30, 30d supply, fill #1
  Filled 2023-06-01 – 2023-06-15 (×5): qty 30, 30d supply, fill #2
  Filled 2023-06-27 – 2023-07-15 (×3): qty 30, 30d supply, fill #3

## 2023-01-12 MED ORDER — BUSPIRONE HCL 30 MG PO TABS: 30.0000 mg | ORAL_TABLET | Freq: Two times a day (BID) | ORAL | 1 refills | Status: DC

## 2023-01-12 MED ORDER — MIRTAZAPINE 45 MG PO TBDP: 45.0000 mg | ORAL_TABLET | Freq: Every evening | ORAL | 1 refills | Status: DC

## 2023-01-12 MED ORDER — MIRTAZAPINE 30 MG PO TABS
30.0000 mg | ORAL_TABLET | Freq: Every day | ORAL | 0 refills | Status: DC
  Filled 2023-01-12 – 2023-02-14 (×5): qty 30, 30d supply, fill #0

## 2023-01-12 NOTE — Telephone Encounter (Signed)
I do not see that on the list../lmb

## 2023-01-12 NOTE — Patient Outreach (Signed)
  Care Coordination   Follow Up Visit Note   01/12/2023 Name: [name] MRN: [id number] DOB: [birth year]  [name] is a 72 y.o. year old female who sees Plotnikov, Georgina Quint, MD for primary care. I spoke with  [name] by phone today.  What matters to the patients health and wellness today?  Prepare for planned hip surgery scheduled for 8/29.    Goals Addressed             This Visit's Progress    COMPLETED: Care Coordination Activities       Care Coordination Interventions: Discussed patient plans to stay with her aunt following surgery and will no longer need inpatient rehab. Patient reports her aunt has gone through this surgery before and has all the equipment needed for recovery. She will pick patient up when she is discharged from the hospital and transport her home Discussed the patient did attend her Neurology appointment and was advised there are slight declines in memory since last year but nothing to be alarmed about. Patient reports her providers feel her memory loss is directly related to her level of anxiety due to upcomming hip surgery Assessed for acute SW needs - none identified. Encouraged the patient to contact SW as needed        SDOH assessments and interventions completed:  No     Care Coordination Interventions:  Yes, provided   Interventions Today    Flowsheet Row Most Recent Value  Chronic Disease   Chronic disease during today's visit Hypertension (HTN)  General Interventions   General Interventions Discussed/Reviewed General Interventions Reviewed, Doctor Visits  Doctor Visits Discussed/Reviewed Doctor Visits Discussed        Follow up plan: No further intervention required.   Encounter Outcome:  Pt. Visit Completed   Bevelyn Ngo, BSW, CDP Social Worker, Certified Dementia Practitioner [facility] Care Management  Care Coordination [phone]

## 2023-01-12 NOTE — Addendum Note (Signed)
Addended by: Sabino Niemann A on: 01/12/2023 10:27 AM   Modules accepted: Orders
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
## 2023-01-12 NOTE — Patient Instructions (Signed)
Visit Information  Thank you for taking time to visit with me today. Please don't hesitate to contact me if I can be of assistance to you.   Following are the goals we discussed today:  - Stay with aunt while recovering from surgery - Contact your primary care provider as needed   If you are experiencing a Mental Health or Behavioral Health Crisis or need someone to talk to, please go to [facility] [address], [place] [phone]) call [phone]  Patient verbalizes understanding of instructions and care plan provided today and agrees to view in MyChart. Active MyChart status and patient understanding of how to access instructions and care plan via MyChart confirmed with patient.     No further follow up required: Please contact me as needed.  Bevelyn Ngo, BSW, CDP Social Worker, Certified Dementia Practitioner [facility] Care Management  Care Coordination [phone]

## 2023-01-12 NOTE — Telephone Encounter (Signed)
I do not see hydralazine on the med list.  Thank you

## 2023-01-13 ENCOUNTER — Ambulatory Visit (HOSPITAL_COMMUNITY): Payer: 59

## 2023-01-13 ENCOUNTER — Observation Stay (HOSPITAL_COMMUNITY): Payer: 59

## 2023-01-13 ENCOUNTER — Encounter (HOSPITAL_COMMUNITY): Payer: Self-pay | Admitting: Orthopedic Surgery

## 2023-01-13 ENCOUNTER — Encounter (HOSPITAL_COMMUNITY)
Admission: RE | Disposition: A | Discharge status: Home / Self Care | Payer: Self-pay | Source: Ambulatory Visit | Attending: Orthopedic Surgery

## 2023-01-13 ENCOUNTER — Observation Stay (HOSPITAL_COMMUNITY)
Admission: RE | Admit: 2023-01-13 | Discharge: 2023-01-14 | Disposition: A | Discharge status: Home / Self Care | Payer: 59 | Source: Ambulatory Visit | Attending: Orthopedic Surgery | Admitting: Orthopedic Surgery

## 2023-01-13 ENCOUNTER — Ambulatory Visit (HOSPITAL_BASED_OUTPATIENT_CLINIC_OR_DEPARTMENT_OTHER): Payer: 59 | Admitting: Certified Registered"

## 2023-01-13 ENCOUNTER — Other Ambulatory Visit: Payer: Self-pay

## 2023-01-13 ENCOUNTER — Ambulatory Visit (HOSPITAL_COMMUNITY): Payer: 59 | Admitting: Physician Assistant

## 2023-01-13 DIAGNOSIS — I129 Hypertensive chronic kidney disease with stage 1 through stage 4 chronic kidney disease, or unspecified chronic kidney disease: Secondary | ICD-10-CM | POA: Insufficient documentation

## 2023-01-13 DIAGNOSIS — J449 Chronic obstructive pulmonary disease, unspecified: Secondary | ICD-10-CM | POA: Diagnosis not present

## 2023-01-13 DIAGNOSIS — E039 Hypothyroidism, unspecified: Secondary | ICD-10-CM | POA: Insufficient documentation

## 2023-01-13 DIAGNOSIS — N183 Chronic kidney disease, stage 3 unspecified: Secondary | ICD-10-CM | POA: Diagnosis not present

## 2023-01-13 DIAGNOSIS — Z86711 Personal history of pulmonary embolism: Secondary | ICD-10-CM | POA: Diagnosis not present

## 2023-01-13 DIAGNOSIS — Z8616 Personal history of COVID-19: Secondary | ICD-10-CM | POA: Insufficient documentation

## 2023-01-13 DIAGNOSIS — M1611 Unilateral primary osteoarthritis, right hip: Principal | ICD-10-CM | POA: Diagnosis present

## 2023-01-13 DIAGNOSIS — J45909 Unspecified asthma, uncomplicated: Secondary | ICD-10-CM | POA: Insufficient documentation

## 2023-01-13 DIAGNOSIS — Z96641 Presence of right artificial hip joint: Secondary | ICD-10-CM | POA: Diagnosis not present

## 2023-01-13 DIAGNOSIS — Z7982 Long term (current) use of aspirin: Secondary | ICD-10-CM | POA: Diagnosis not present

## 2023-01-13 DIAGNOSIS — Z8544 Personal history of malignant neoplasm of other female genital organs: Secondary | ICD-10-CM | POA: Insufficient documentation

## 2023-01-13 DIAGNOSIS — Z79899 Other long term (current) drug therapy: Secondary | ICD-10-CM | POA: Insufficient documentation

## 2023-01-13 DIAGNOSIS — N1832 Chronic kidney disease, stage 3b: Secondary | ICD-10-CM | POA: Diagnosis not present

## 2023-01-13 DIAGNOSIS — Z01818 Encounter for other preprocedural examination: Secondary | ICD-10-CM

## 2023-01-13 DIAGNOSIS — Z471 Aftercare following joint replacement surgery: Secondary | ICD-10-CM | POA: Diagnosis not present

## 2023-01-13 HISTORY — PX: TOTAL HIP ARTHROPLASTY: SHX124

## 2023-01-13 LAB — TYPE AND SCREEN
ABO/RH(D): O POS
Antibody Screen: NEGATIVE

## 2023-01-13 SURGERY — ARTHROPLASTY, HIP, TOTAL, ANTERIOR APPROACH
Anesthesia: Monitor Anesthesia Care | Site: Hip | Laterality: Right

## 2023-01-13 MED ORDER — ALPRAZOLAM 0.5 MG PO TABS: 1.0000 mg | ORAL_TABLET | Freq: Three times a day (TID) | ORAL | Status: DC | PRN

## 2023-01-13 MED ORDER — LEVOTHYROXINE SODIUM 100 MCG PO TABS
100.0000 ug | ORAL_TABLET | Freq: Every day | ORAL | Status: DC
  Administered 2023-01-14: 100 ug via ORAL
  Filled 2023-01-13: qty 1

## 2023-01-13 MED ORDER — MORPHINE SULFATE (PF) 2 MG/ML IV SOLN: 0.5000 mg | INTRAVENOUS | Status: DC | PRN

## 2023-01-13 MED ORDER — KETOROLAC TROMETHAMINE 30 MG/ML IJ SOLN
INTRAMUSCULAR | Status: DC | PRN
  Administered 2023-01-13: 30 mg via INTRA_ARTICULAR

## 2023-01-13 MED ORDER — SODIUM CHLORIDE 0.9 % IR SOLN
Status: DC | PRN
  Administered 2023-01-13: 1000 mL
  Administered 2023-01-13: 3000 mL

## 2023-01-13 MED ORDER — FENTANYL CITRATE (PF) 100 MCG/2ML IJ SOLN
INTRAMUSCULAR | Status: AC
  Filled 2023-01-13: qty 2

## 2023-01-13 MED ORDER — DEXAMETHASONE SODIUM PHOSPHATE 10 MG/ML IJ SOLN
INTRAMUSCULAR | Status: AC
  Filled 2023-01-13: qty 1

## 2023-01-13 MED ORDER — FLUOXETINE HCL 20 MG PO CAPS
40.0000 mg | ORAL_CAPSULE | Freq: Every day | ORAL | Status: DC
  Administered 2023-01-14: 40 mg via ORAL
  Filled 2023-01-13: qty 2

## 2023-01-13 MED ORDER — CEFAZOLIN SODIUM-DEXTROSE 2-4 GM/100ML-% IV SOLN
2.0000 g | INTRAVENOUS | Status: AC
  Administered 2023-01-13: 2 g via INTRAVENOUS
  Filled 2023-01-13: qty 100

## 2023-01-13 MED ORDER — PROPOFOL 500 MG/50ML IV EMUL
INTRAVENOUS | Status: DC | PRN
  Administered 2023-01-13: 100 ug/kg/min via INTRAVENOUS

## 2023-01-13 MED ORDER — FENTANYL CITRATE (PF) 100 MCG/2ML IJ SOLN
INTRAMUSCULAR | Status: DC | PRN
  Administered 2023-01-13 (×2): 50 ug via INTRAVENOUS

## 2023-01-13 MED ORDER — ONDANSETRON HCL 4 MG/2ML IJ SOLN
INTRAMUSCULAR | Status: AC
  Filled 2023-01-13: qty 2

## 2023-01-13 MED ORDER — DEXAMETHASONE SODIUM PHOSPHATE 10 MG/ML IJ SOLN
INTRAMUSCULAR | Status: DC | PRN
  Administered 2023-01-13: 5 mg via INTRAVENOUS

## 2023-01-13 MED ORDER — DIPHENHYDRAMINE HCL 12.5 MG/5ML PO ELIX: 12.5000 mg | ORAL_SOLUTION | ORAL | Status: DC | PRN

## 2023-01-13 MED ORDER — BUPIVACAINE-EPINEPHRINE 0.25% -1:200000 IJ SOLN
INTRAMUSCULAR | Status: DC | PRN
  Administered 2023-01-13: 30 mL

## 2023-01-13 MED ORDER — ACETAMINOPHEN 500 MG PO TABS: 1000.0000 mg | ORAL_TABLET | Freq: Once | ORAL | Status: DC

## 2023-01-13 MED ORDER — ACETAMINOPHEN 325 MG PO TABS: 325.0000 mg | ORAL_TABLET | Freq: Four times a day (QID) | ORAL | Status: DC | PRN

## 2023-01-13 MED ORDER — BUPIVACAINE IN DEXTROSE 0.75-8.25 % IT SOLN
INTRATHECAL | Status: DC | PRN
  Administered 2023-01-13: 2 mL via INTRATHECAL

## 2023-01-13 MED ORDER — ASPIRIN 81 MG PO CHEW
81.0000 mg | CHEWABLE_TABLET | Freq: Two times a day (BID) | ORAL | Status: DC
  Administered 2023-01-13 – 2023-01-14 (×2): 81 mg via ORAL
  Filled 2023-01-13 (×2): qty 1

## 2023-01-13 MED ORDER — PHENYLEPHRINE HCL-NACL 20-0.9 MG/250ML-% IV SOLN
INTRAVENOUS | Status: DC | PRN
  Administered 2023-01-13: 30 ug/min via INTRAVENOUS

## 2023-01-13 MED ORDER — PHENOL 1.4 % MT LIQD: 1.0000 | OROMUCOSAL | Status: DC | PRN

## 2023-01-13 MED ORDER — WATER FOR IRRIGATION, STERILE IR SOLN
Status: DC | PRN
  Administered 2023-01-13: 2000 mL

## 2023-01-13 MED ORDER — METOCLOPRAMIDE HCL 5 MG PO TABS: 5.0000 mg | ORAL_TABLET | Freq: Three times a day (TID) | ORAL | Status: DC | PRN

## 2023-01-13 MED ORDER — SODIUM CHLORIDE (PF) 0.9 % IJ SOLN
INTRAMUSCULAR | Status: DC | PRN
  Administered 2023-01-13: 30 mL

## 2023-01-13 MED ORDER — OXYCODONE HCL 5 MG PO TABS: 5.0000 mg | ORAL_TABLET | Freq: Once | ORAL | Status: DC | PRN

## 2023-01-13 MED ORDER — IPRATROPIUM-ALBUTEROL 0.5-2.5 (3) MG/3ML IN SOLN: 3.0000 mL | Freq: Four times a day (QID) | RESPIRATORY_TRACT | Status: DC | PRN

## 2023-01-13 MED ORDER — POLYETHYLENE GLYCOL 3350 17 G PO PACK: 17.0000 g | PACK | Freq: Every day | ORAL | Status: DC | PRN

## 2023-01-13 MED ORDER — METHOCARBAMOL 500 MG IVPB - SIMPLE MED
500.0000 mg | Freq: Four times a day (QID) | INTRAVENOUS | Status: DC | PRN
  Administered 2023-01-13: 500 mg via INTRAVENOUS

## 2023-01-13 MED ORDER — BISACODYL 10 MG RE SUPP: 10.0000 mg | Freq: Every day | RECTAL | Status: DC | PRN

## 2023-01-13 MED ORDER — ACETAMINOPHEN 500 MG PO TABS
1000.0000 mg | ORAL_TABLET | Freq: Once | ORAL | Status: AC
  Administered 2023-01-13: 1000 mg via ORAL
  Filled 2023-01-13: qty 2

## 2023-01-13 MED ORDER — PRONTOSAN WOUND IRRIGATION OPTIME
TOPICAL | Status: DC | PRN
  Administered 2023-01-13: 1 via TOPICAL

## 2023-01-13 MED ORDER — METHOCARBAMOL 500 MG PO TABS
500.0000 mg | ORAL_TABLET | Freq: Four times a day (QID) | ORAL | Status: DC | PRN
  Filled 2023-01-13: qty 1

## 2023-01-13 MED ORDER — HYDROCODONE-ACETAMINOPHEN 7.5-325 MG PO TABS
1.0000 | ORAL_TABLET | ORAL | Status: DC | PRN
  Administered 2023-01-13 (×2): 2 via ORAL
  Filled 2023-01-13 (×2): qty 2

## 2023-01-13 MED ORDER — FENTANYL CITRATE PF 50 MCG/ML IJ SOSY: 25.0000 ug | PREFILLED_SYRINGE | INTRAMUSCULAR | Status: DC | PRN

## 2023-01-13 MED ORDER — 0.9 % SODIUM CHLORIDE (POUR BTL) OPTIME
TOPICAL | Status: DC | PRN
  Administered 2023-01-13: 1000 mL

## 2023-01-13 MED ORDER — COLESEVELAM HCL 625 MG PO TABS
1250.0000 mg | ORAL_TABLET | Freq: Two times a day (BID) | ORAL | Status: DC
  Administered 2023-01-13 – 2023-01-14 (×2): 1250 mg via ORAL
  Filled 2023-01-13 (×3): qty 2

## 2023-01-13 MED ORDER — SODIUM CHLORIDE (PF) 0.9 % IJ SOLN
INTRAMUSCULAR | Status: AC
  Filled 2023-01-13: qty 50

## 2023-01-13 MED ORDER — LACTATED RINGERS IV SOLN: INTRAVENOUS | Status: DC

## 2023-01-13 MED ORDER — METOCLOPRAMIDE HCL 5 MG/ML IJ SOLN: 5.0000 mg | Freq: Three times a day (TID) | INTRAMUSCULAR | Status: DC | PRN

## 2023-01-13 MED ORDER — HYDROCODONE-ACETAMINOPHEN 5-325 MG PO TABS
1.0000 | ORAL_TABLET | ORAL | Status: DC | PRN
  Administered 2023-01-13: 2 via ORAL
  Administered 2023-01-14 (×2): 1 via ORAL
  Filled 2023-01-13: qty 2
  Filled 2023-01-13: qty 1
  Filled 2023-01-13: qty 2

## 2023-01-13 MED ORDER — CLONIDINE HCL 0.1 MG PO TABS
0.2000 mg | ORAL_TABLET | Freq: Every day | ORAL | Status: DC
  Administered 2023-01-13: 0.2 mg via ORAL
  Filled 2023-01-13: qty 2

## 2023-01-13 MED ORDER — ISOPROPYL ALCOHOL 70 % SOLN
Status: DC | PRN
  Administered 2023-01-13: 1 via TOPICAL

## 2023-01-13 MED ORDER — CEFAZOLIN SODIUM-DEXTROSE 2-4 GM/100ML-% IV SOLN
2.0000 g | Freq: Four times a day (QID) | INTRAVENOUS | Status: AC
  Administered 2023-01-13 (×2): 2 g via INTRAVENOUS
  Filled 2023-01-13 (×2): qty 100

## 2023-01-13 MED ORDER — DOCUSATE SODIUM 100 MG PO CAPS
100.0000 mg | ORAL_CAPSULE | Freq: Two times a day (BID) | ORAL | Status: DC
  Administered 2023-01-13 – 2023-01-14 (×2): 100 mg via ORAL
  Filled 2023-01-13 (×2): qty 1

## 2023-01-13 MED ORDER — ONDANSETRON HCL 4 MG/2ML IJ SOLN: 4.0000 mg | Freq: Four times a day (QID) | INTRAMUSCULAR | Status: DC | PRN

## 2023-01-13 MED ORDER — ALUM & MAG HYDROXIDE-SIMETH 200-200-20 MG/5ML PO SUSP: 30.0000 mL | ORAL | Status: DC | PRN

## 2023-01-13 MED ORDER — CARVEDILOL 12.5 MG PO TABS
12.5000 mg | ORAL_TABLET | Freq: Two times a day (BID) | ORAL | Status: DC
  Administered 2023-01-13 – 2023-01-14 (×2): 12.5 mg via ORAL
  Filled 2023-01-13 (×2): qty 1

## 2023-01-13 MED ORDER — ISOPROPYL ALCOHOL 70 % SOLN
Status: AC
  Filled 2023-01-13: qty 480

## 2023-01-13 MED ORDER — METHOCARBAMOL 500 MG IVPB - SIMPLE MED
INTRAVENOUS | Status: AC
  Filled 2023-01-13: qty 55

## 2023-01-13 MED ORDER — ONDANSETRON HCL 4 MG PO TABS: 4.0000 mg | ORAL_TABLET | Freq: Four times a day (QID) | ORAL | Status: DC | PRN

## 2023-01-13 MED ORDER — KETOROLAC TROMETHAMINE 30 MG/ML IJ SOLN
INTRAMUSCULAR | Status: AC
  Filled 2023-01-13: qty 1

## 2023-01-13 MED ORDER — CELECOXIB 100 MG PO CAPS
ORAL_CAPSULE | ORAL | Status: DC | PRN
  Administered 2023-01-13: 200 mg via ORAL

## 2023-01-13 MED ORDER — BUPIVACAINE-EPINEPHRINE 0.25% -1:200000 IJ SOLN
INTRAMUSCULAR | Status: AC
  Filled 2023-01-13: qty 1

## 2023-01-13 MED ORDER — ORAL CARE MOUTH RINSE: 15.0000 mL | Freq: Once | OROMUCOSAL | Status: AC

## 2023-01-13 MED ORDER — SODIUM CHLORIDE 0.9 % IV SOLN: INTRAVENOUS | Status: DC

## 2023-01-13 MED ORDER — SENNA 8.6 MG PO TABS
1.0000 | ORAL_TABLET | Freq: Two times a day (BID) | ORAL | Status: DC
  Administered 2023-01-14: 8.6 mg via ORAL
  Filled 2023-01-13 (×2): qty 1

## 2023-01-13 MED ORDER — ROSUVASTATIN CALCIUM 10 MG PO TABS
10.0000 mg | ORAL_TABLET | Freq: Every day | ORAL | Status: DC
  Administered 2023-01-14: 10 mg via ORAL
  Filled 2023-01-13: qty 1

## 2023-01-13 MED ORDER — BUSPIRONE HCL 10 MG PO TABS
30.0000 mg | ORAL_TABLET | Freq: Two times a day (BID) | ORAL | Status: DC
  Administered 2023-01-13 – 2023-01-14 (×2): 30 mg via ORAL
  Filled 2023-01-13 (×2): qty 3

## 2023-01-13 MED ORDER — POVIDONE-IODINE 10 % EX SWAB: 2.0000 | Freq: Once | CUTANEOUS | Status: DC

## 2023-01-13 MED ORDER — OXYCODONE HCL 5 MG/5ML PO SOLN: 5.0000 mg | Freq: Once | ORAL | Status: DC | PRN

## 2023-01-13 MED ORDER — MENTHOL 3 MG MT LOZG: 1.0000 | LOZENGE | OROMUCOSAL | Status: DC | PRN

## 2023-01-13 MED ORDER — PROPOFOL 10 MG/ML IV BOLUS
INTRAVENOUS | Status: DC | PRN
Start: 2023-01-13 — End: 2023-01-13
  Administered 2023-01-13: 30 mg via INTRAVENOUS

## 2023-01-13 MED ORDER — CHLORHEXIDINE GLUCONATE 0.12 % MT SOLN
15.0000 mL | Freq: Once | OROMUCOSAL | Status: AC
  Administered 2023-01-13: 15 mL via OROMUCOSAL

## 2023-01-13 MED ORDER — PANTOPRAZOLE SODIUM 40 MG PO TBEC
40.0000 mg | DELAYED_RELEASE_TABLET | Freq: Every day | ORAL | Status: DC
  Administered 2023-01-13 – 2023-01-14 (×2): 40 mg via ORAL
  Filled 2023-01-13 (×2): qty 1

## 2023-01-13 MED ORDER — PROPOFOL 1000 MG/100ML IV EMUL
INTRAVENOUS | Status: AC
  Filled 2023-01-13: qty 100

## 2023-01-13 MED ORDER — ONDANSETRON HCL 4 MG/2ML IJ SOLN
INTRAMUSCULAR | Status: DC | PRN
  Administered 2023-01-13: 4 mg via INTRAVENOUS

## 2023-01-13 MED ORDER — VITAMIN C 500 MG PO TABS
500.0000 mg | ORAL_TABLET | Freq: Every day | ORAL | Status: DC
  Administered 2023-01-14: 500 mg via ORAL
  Filled 2023-01-13: qty 1

## 2023-01-13 MED ORDER — TRANEXAMIC ACID-NACL 1000-0.7 MG/100ML-% IV SOLN
1000.0000 mg | INTRAVENOUS | Status: AC
  Administered 2023-01-13: 1000 mg via INTRAVENOUS
  Filled 2023-01-13: qty 100

## 2023-01-13 MED ORDER — DROPERIDOL 2.5 MG/ML IJ SOLN: 0.6250 mg | Freq: Once | INTRAMUSCULAR | Status: DC | PRN

## 2023-01-13 MED ORDER — MIRTAZAPINE 15 MG PO TABS
30.0000 mg | ORAL_TABLET | Freq: Every day | ORAL | Status: DC
  Administered 2023-01-13: 30 mg via ORAL
  Filled 2023-01-13: qty 2

## 2023-01-13 MED ORDER — ACETAMINOPHEN 500 MG PO TABS
500.0000 mg | ORAL_TABLET | Freq: Four times a day (QID) | ORAL | Status: DC
  Administered 2023-01-13: 500 mg via ORAL
  Filled 2023-01-13 (×2): qty 1

## 2023-01-13 MED ORDER — CELECOXIB 200 MG PO CAPS
ORAL_CAPSULE | ORAL | Status: AC
  Filled 2023-01-13: qty 1

## 2023-01-13 SURGICAL SUPPLY — 56 items
ACE SHELL 54 4H HIP (Shell) ×1 IMPLANT
ADH SKN CLS APL DERMABOND .7 (GAUZE/BANDAGES/DRESSINGS) ×2
APL PRP STRL LF DISP 70% ISPRP (MISCELLANEOUS) ×1
BAG COUNTER SPONGE SURGICOUNT (BAG) IMPLANT
BAG SPEC THK2 15X12 ZIP CLS (MISCELLANEOUS)
BAG SPNG CNTER NS LX DISP (BAG)
BAG ZIPLOCK 12X15 (MISCELLANEOUS) IMPLANT
CHLORAPREP W/TINT 26 (MISCELLANEOUS) ×1 IMPLANT
COVER PERINEAL POST (MISCELLANEOUS) ×1 IMPLANT
COVER SURGICAL LIGHT HANDLE (MISCELLANEOUS) ×1 IMPLANT
DERMABOND ADVANCED .7 DNX12 (GAUZE/BANDAGES/DRESSINGS) ×2 IMPLANT
DRAPE IMP U-DRAPE 54X76 (DRAPES) ×1 IMPLANT
DRAPE SHEET LG 3/4 BI-LAMINATE (DRAPES) ×3 IMPLANT
DRAPE STERI IOBAN 125X83 (DRAPES) ×1 IMPLANT
DRAPE U-SHAPE 47X51 STRL (DRAPES) ×1 IMPLANT
DRSG AQUACEL AG ADV 3.5X10 (GAUZE/BANDAGES/DRESSINGS) ×1 IMPLANT
ELECT REM PT RETURN 15FT ADLT (MISCELLANEOUS) ×1 IMPLANT
GAUZE SPONGE 4X4 12PLY STRL (GAUZE/BANDAGES/DRESSINGS) ×1 IMPLANT
GLOVE BIO SURGEON STRL SZ7 (GLOVE) ×1 IMPLANT
GLOVE BIO SURGEON STRL SZ8.5 (GLOVE) ×2 IMPLANT
GLOVE BIOGEL PI IND STRL 7.5 (GLOVE) ×1 IMPLANT
GLOVE BIOGEL PI IND STRL 8.5 (GLOVE) ×1 IMPLANT
GOWN SPEC L3 XXLG W/TWL (GOWN DISPOSABLE) ×1 IMPLANT
GOWN STRL REUS W/ TWL XL LVL3 (GOWN DISPOSABLE) ×1 IMPLANT
GOWN STRL REUS W/TWL XL LVL3 (GOWN DISPOSABLE) ×1
HANDPIECE INTERPULSE COAX TIP (DISPOSABLE) ×1
HEAD CERAMIC BIOLOX 36 (Head) IMPLANT
HOLDER FOLEY CATH W/STRAP (MISCELLANEOUS) ×1 IMPLANT
HOOD PEEL AWAY T7 (MISCELLANEOUS) ×3 IMPLANT
KIT TURNOVER KIT A (KITS) IMPLANT
LINER ACETAB VIT E +5 36 F (Liner) IMPLANT
MANIFOLD NEPTUNE II (INSTRUMENTS) ×1 IMPLANT
MARKER SKIN DUAL TIP RULER LAB (MISCELLANEOUS) ×1 IMPLANT
NDL SAFETY ECLIP 18X1.5 (MISCELLANEOUS) ×1 IMPLANT
NDL SPNL 18GX3.5 QUINCKE PK (NEEDLE) ×1 IMPLANT
NEEDLE SPNL 18GX3.5 QUINCKE PK (NEEDLE) ×1
PACK ANTERIOR HIP CUSTOM (KITS) ×1 IMPLANT
SAW OSC TIP CART 19.5X105X1.3 (SAW) ×1 IMPLANT
SEALER BIPOLAR AQUA 6.0 (INSTRUMENTS) ×1 IMPLANT
SET HNDPC FAN SPRY TIP SCT (DISPOSABLE) ×1 IMPLANT
SHELL ACETAB 54 4H HIP (Shell) IMPLANT
SOLUTION PRONTOSAN WOUND 350ML (IRRIGATION / IRRIGATOR) ×1 IMPLANT
SPIKE FLUID TRANSFER (MISCELLANEOUS) ×1 IMPLANT
STEM FEM CMTLS 10X105 (Stem) IMPLANT
SUT MNCRL AB 3-0 PS2 18 (SUTURE) ×1 IMPLANT
SUT MON AB 2-0 CT1 36 (SUTURE) ×1 IMPLANT
SUT STRATAFIX PDO 1 14 VIOLET (SUTURE) ×1
SUT STRATFX PDO 1 14 VIOLET (SUTURE) ×1
SUT VIC AB 2-0 CT1 27 (SUTURE)
SUT VIC AB 2-0 CT1 TAPERPNT 27 (SUTURE) IMPLANT
SUTURE STRATFX PDO 1 14 VIOLET (SUTURE) ×1 IMPLANT
SYR 3ML LL SCALE MARK (SYRINGE) ×1 IMPLANT
TRAY FOLEY MTR SLVR 14FR STAT (SET/KITS/TRAYS/PACK) IMPLANT
TRAY FOLEY MTR SLVR 16FR STAT (SET/KITS/TRAYS/PACK) IMPLANT
TUBE SUCTION HIGH CAP CLEAR NV (SUCTIONS) ×1 IMPLANT
WATER STERILE IRR 1000ML POUR (IV SOLUTION) ×1 IMPLANT

## 2023-01-13 NOTE — Discharge Instructions (Signed)

## 2023-01-13 NOTE — Evaluation (Signed)
Physical Therapy Evaluation Patient Details Name: [name] MRN: [id number] DOB: 1950-07-30 Today's Date: 01/13/2023  History of Present Illness  Pt is a 72 year old female s/p Right THA direct anterior approach on 01/13/23.  PMHx significant for but not limited to: cervical fusion, bipolar disorder, depression, COPD, CKD, fibromyalgia, anxiety  Clinical Impression  Pt is s/p THA resulting in the deficits listed below (see PT Problem List).  Pt will benefit from acute skilled PT to increase their independence and safety with mobility to facilitate discharge.  Pt very pleasant and talkative.  Pt able to ambulate short distance into hallway.  Pt plans to d/c home with her aunt with 2 short steps to enter home.  Pt will need RW upon d/c.         If plan is discharge home, recommend the following: Help with stairs or ramp for entrance   Can travel by private vehicle        Equipment Recommendations Rolling walker (2 wheels)  Recommendations for Other Services       Functional Status Assessment Patient has had a recent decline in their functional status and demonstrates the ability to make significant improvements in function in a reasonable and predictable amount of time.     Precautions / Restrictions Precautions Precautions: Fall      Mobility  Bed Mobility Overal bed mobility: Needs Assistance Bed Mobility: Supine to Sit     Supine to sit: HOB elevated, Supervision     General bed mobility comments: supervision for safety and lines    Transfers Overall transfer level: Needs assistance Equipment used: Rolling walker (2 wheels) Transfers: Sit to/from Stand Sit to Stand: Contact guard assist           General transfer comment: verbal cues for hand placement    Ambulation/Gait Ambulation/Gait assistance: Contact guard assist Gait Distance (Feet): 35 Feet Assistive device: Rolling walker (2 wheels) Gait Pattern/deviations: Step-to pattern, Decreased  stance time - right, Antalgic       General Gait Details: verbal cues for sequence, RW Positioning, step length, posture  Stairs            Wheelchair Mobility     Tilt Bed    Modified Rankin (Stroke Patients Only)       Balance                                             Pertinent Vitals/Pain Pain Assessment Pain Assessment: 0-10 Pain Score: 5  Pain Location: right hip Pain Descriptors / Indicators: Sore, Guarding Pain Intervention(s): Repositioned, Monitored during session, Premedicated before session    Home Living Family/patient expects to be discharged to:: Private residence Living Arrangements: Other relatives   Type of Home: House Home Access: Stairs to enter Entrance Stairs-Rails: Left Entrance Stairs-Number of Steps: 2   Home Layout: One level Home Equipment: Cane - single point Additional Comments: going to her aunt's home as above    Prior Function Prior Level of Function : Independent/Modified Independent             Mobility Comments: using SPC prior to surgery       Extremity/Trunk Assessment        Lower Extremity Assessment Lower Extremity Assessment: RLE deficits/detail RLE Deficits / Details: anticipated post op hip weakness       Communication   Communication Communication: No  apparent difficulties  Cognition Arousal: Alert Behavior During Therapy: WFL for tasks assessed/performed Overall Cognitive Status: Within Functional Limits for tasks assessed                                          General Comments General comments (skin integrity, edema, etc.): Pt has had 2 falls in the past 2 months unexpectedly due to R LE giving away    Exercises     Assessment/Plan    PT Assessment Patient needs continued PT services  PT Problem List Decreased strength;Decreased activity tolerance;Decreased mobility;Pain;Decreased knowledge of use of DME       PT Treatment Interventions Stair  training;Gait training;DME instruction;Balance training;Functional mobility training;Therapeutic activities;Therapeutic exercise;Patient/family education    PT Goals (Current goals can be found in the Care Plan section)  Acute Rehab PT Goals PT Goal Formulation: With patient Time For Goal Achievement: 01/20/23 Potential to Achieve Goals: Good    Frequency 7X/week     Co-evaluation               AM-PAC PT "6 Clicks" Mobility  Outcome Measure Help needed turning from your back to your side while in a flat bed without using bedrails?: A Little Help needed moving from lying on your back to sitting on the side of a flat bed without using bedrails?: A Little Help needed moving to and from a bed to a chair (including a wheelchair)?: A Little Help needed standing up from a chair using your arms (e.g., wheelchair or bedside chair)?: A Little Help needed to walk in hospital room?: A Little Help needed climbing 3-5 steps with a railing? : A Little 6 Click Score: 18    End of Session Equipment Utilized During Treatment: Gait belt Activity Tolerance: Patient tolerated treatment well Patient left: in chair;with call bell/phone within reach;with chair alarm set Nurse Communication: Mobility status PT Visit Diagnosis: Difficulty in walking, not elsewhere classified (R26.2)    Time: 1431-1500 PT Time Calculation (min) (ACUTE ONLY): 29 min   Charges:   PT Evaluation $PT Eval Low Complexity: 1 Low   PT General Charges $$ ACUTE PT VISIT: 1 Visit        Thomasene Mohair PT, DPT Physical Therapist Acute Rehabilitation Services Office: [phone]   Janan Halter [place] 01/13/2023, 3:07 PM

## 2023-01-13 NOTE — Interval H&P Note (Signed)
History and Physical Interval Note:  01/13/2023 7:22 AM  [name]  has presented today for surgery, with the diagnosis of Right hip osteoarthritis.  The various methods of treatment have been discussed with the patient and family. After consideration of risks, benefits and other options for treatment, the patient has consented to  Procedure(s) with comments: TOTAL HIP ARTHROPLASTY ANTERIOR APPROACH (Right) - 130 as a surgical intervention.  The patient's history has been reviewed, patient examined, no change in status, stable for surgery.  I have reviewed the patient's chart and labs.  Questions were answered to the patient's satisfaction.     Iline Oven Vashawn Ekstein

## 2023-01-13 NOTE — Anesthesia Procedure Notes (Signed)
Procedure Name: MAC Date/Time: 01/13/2023 7:30 AM  Performed by: Vanessa Plano, CRNAPre-anesthesia Checklist: Patient identified, Emergency Drugs available, Suction available and Patient being monitored Patient Re-evaluated:Patient Re-evaluated prior to induction Oxygen Delivery Method: Simple face mask

## 2023-01-13 NOTE — Op Note (Signed)
OPERATIVE REPORT  SURGEON: Samson Frederic, MD   ASSISTANT: Clint Bolder, PA-C.  PREOPERATIVE DIAGNOSIS: Right hip arthritis.   POSTOPERATIVE DIAGNOSIS: Right hip arthritis.   PROCEDURE: Right total hip arthroplasty, anterior approach.   IMPLANTS: Biomet Taperloc Complete Microplasty stem, size 10 x 105 mm, high offset. Biomet G7 OsseoTi Cup, size 54 mm. Biomet Vivacit-E liner, size 36 mm, F, +5 mm neutral. Biomet Biolox ceramic head ball, size 36 - 3 mm.  ANESTHESIA:  MAC and Spinal  ESTIMATED BLOOD LOSS:-50 mL    ANTIBIOTICS: 2g Ancef.  DRAINS: None.  COMPLICATIONS: None.   CONDITION: PACU - hemodynamically stable.   BRIEF CLINICAL NOTE: [name] is a 72 y.o. female with a long-standing history of Right hip arthritis. After failing conservative management, the patient was indicated for total hip arthroplasty. The risks, benefits, and alternatives to the procedure were explained, and the patient elected to proceed.  PROCEDURE IN DETAIL: Surgical site was marked by myself in the pre-op holding area. Once inside the operating room, spinal anesthesia was obtained, and a foley catheter was inserted. The patient was then positioned on the Hana table.  All bony prominences were well padded.  The hip was prepped and draped in the normal sterile surgical fashion.  A time-out was called verifying side and site of surgery. The patient received IV antibiotics within 60 minutes of beginning the procedure.   Bikini incision was made, and superficial dissection was performed lateral to the ASIS. The direct anterior approach to the hip was performed through the Hueter interval.  Lateral femoral circumflex vessels were treated with the Auqumantys. The anterior capsule was exposed and an inverted T capsulotomy was made. The femoral neck cut was made to the level of the templated cut.  A corkscrew was placed into the head and the head was removed.  The femoral head was found to have  eburnated bone. The head was passed to the back table and was measured. Pubofemoral ligament was released off of the calcar, taking care to stay on bone. Superior capsule was released from the greater trochanter, taking care to stay lateral to the posterior border of the femoral neck in order to preserve the short external rotators.   Acetabular exposure was achieved, and the pulvinar and labrum were excised. Sequential reaming of the acetabulum was then performed up to a size 53 mm reamer. A 54 mm cup was then opened and impacted into place at approximately 40 degrees of abduction and 20 degrees of anteversion. The final polyethylene liner was impacted into place and acetabular osteophytes were removed.    I then gained femoral exposure taking care to protect the abductors and greater trochanter.  This was performed using standard external rotation, extension, and adduction.  A cookie cutter was used to enter the femoral canal, and then the femoral canal finder was placed.  Sequential broaching was performed up to a size 10.  Calcar planer was used on the femoral neck remnant.  I placed a high offset neck and a trial head ball.  The hip was reduced.  Leg lengths and offset were checked fluoroscopically.  The hip was dislocated and trial components were removed.  The final implants were placed, and the hip was reduced.  Fluoroscopy was used to confirm component position and leg lengths.  At 90 degrees of external rotation and full extension, the hip was stable to an anterior directed force.   The wound was copiously irrigated with Prontosan solution and normal saline using pulse  lavage.  Marcaine solution was injected into the periarticular soft tissue.  The wound was closed in layers using #1 Vicryl and V-Loc for the fascia, 2-0 Vicryl for the subcutaneous fat, 2-0 Monocryl for the deep dermal layer, 3-0 running Monocryl subcuticular stitch, and Dermabond for the skin.  Once the glue was fully dried, an  Aquacell Ag dressing was applied.  The patient was transported to the recovery room in stable condition.  Sponge, needle, and instrument counts were correct at the end of the case x2.  The patient tolerated the procedure well and there were no known complications.  The aquamantis was utilized for this case to help facilitate better hemostasis as patient was felt to be at increased risk of bleeding because of complex case requiring increased OR time and/or exposure - minimally invasive approach.  A oscillating saw tip was utilized for this case to prevent damage to the soft tissue structures such as muscles, ligaments and tendons, and to ensure accurate bone cuts. This patient was at increased risk for above structures due to  minimally invasive approach.  Please note that a surgical assistant was a medical necessity for this procedure to perform it in a safe and expeditious manner. Assistant was necessary to provide appropriate retraction of vital neurovascular structures, to prevent femoral fracture, and to allow for anatomic placement of the prosthesis.

## 2023-01-13 NOTE — Care Plan (Signed)
Ortho Bundle Case Management Note  Patient Details  Name: [name] MRN: [id number] Date of Birth: [birth year]  R THA on 01-13-23 DCP:  Home with aunt DME:  RW ordered through [employer] PT:  HEP                   DME Arranged:  Dan Humphreys rolling DME Agency:  [employer]  HH Arranged:  NA HH Agency:  NA  Additional Comments: Please contact me with any questions of if this plan should need to change.  Ennis Forts, RN,CCM [facility]  [phone] 01/13/2023, 5:06 PM

## 2023-01-13 NOTE — Anesthesia Procedure Notes (Signed)
Spinal  Patient location during procedure: OR Start time: 01/13/2023 7:33 AM End time: 01/13/2023 7:37 AM Staffing Performed: resident/CRNA  Resident/CRNA: Vanessa [place], CRNA Performed by: Vanessa [place], CRNA Authorized by: Atilano Median, DO   Preanesthetic Checklist Completed: patient identified, IV checked, site marked, risks and benefits discussed, surgical consent, monitors and equipment checked, pre-op evaluation and timeout performed Spinal Block Patient position: sitting Prep: DuraPrep Patient monitoring: heart rate, blood pressure, continuous pulse ox and cardiac monitor Approach: midline Location: L3-4 Needle Needle type: Pencan  Needle gauge: 24 G Needle length: 10 cm Needle insertion depth: 7 cm Assessment Events: CSF return

## 2023-01-13 NOTE — Transfer of Care (Signed)
Immediate Anesthesia Transfer of Care Note  Patient: [name]  Procedure(s) Performed: TOTAL HIP ARTHROPLASTY ANTERIOR APPROACH (Right: Hip)  Patient Location: PACU  Anesthesia Type:Spinal  Level of Consciousness: awake and patient cooperative  Airway & Oxygen Therapy: Patient Spontanous Breathing and Patient connected to face mask  Post-op Assessment: Report given to RN and Post -op Vital signs reviewed and stable  Post vital signs: Reviewed and stable  Last Vitals:  Vitals Value Taken Time  BP 75/43 01/13/23 0934  Temp    Pulse 56 01/13/23 0938  Resp 15 01/13/23 0938  SpO2 100 % 01/13/23 0938  Vitals shown include unfiled device data.  Last Pain:  Vitals:   01/13/23 0620  TempSrc: Oral  PainSc:          Complications: No notable events documented.

## 2023-01-14 ENCOUNTER — Other Ambulatory Visit (HOSPITAL_COMMUNITY): Payer: Self-pay

## 2023-01-14 ENCOUNTER — Encounter (HOSPITAL_COMMUNITY): Payer: Self-pay | Admitting: Orthopedic Surgery

## 2023-01-14 ENCOUNTER — Other Ambulatory Visit: Payer: Self-pay

## 2023-01-14 DIAGNOSIS — E039 Hypothyroidism, unspecified: Secondary | ICD-10-CM | POA: Diagnosis not present

## 2023-01-14 DIAGNOSIS — Z8616 Personal history of COVID-19: Secondary | ICD-10-CM | POA: Diagnosis not present

## 2023-01-14 DIAGNOSIS — I129 Hypertensive chronic kidney disease with stage 1 through stage 4 chronic kidney disease, or unspecified chronic kidney disease: Secondary | ICD-10-CM | POA: Diagnosis not present

## 2023-01-14 DIAGNOSIS — Z86711 Personal history of pulmonary embolism: Secondary | ICD-10-CM | POA: Diagnosis not present

## 2023-01-14 DIAGNOSIS — J449 Chronic obstructive pulmonary disease, unspecified: Secondary | ICD-10-CM | POA: Diagnosis not present

## 2023-01-14 DIAGNOSIS — Z96641 Presence of right artificial hip joint: Secondary | ICD-10-CM | POA: Diagnosis not present

## 2023-01-14 DIAGNOSIS — M1611 Unilateral primary osteoarthritis, right hip: Secondary | ICD-10-CM | POA: Diagnosis not present

## 2023-01-14 DIAGNOSIS — J45909 Unspecified asthma, uncomplicated: Secondary | ICD-10-CM | POA: Diagnosis not present

## 2023-01-14 DIAGNOSIS — Z7982 Long term (current) use of aspirin: Secondary | ICD-10-CM | POA: Diagnosis not present

## 2023-01-14 DIAGNOSIS — N183 Chronic kidney disease, stage 3 unspecified: Secondary | ICD-10-CM | POA: Diagnosis not present

## 2023-01-14 DIAGNOSIS — Z79899 Other long term (current) drug therapy: Secondary | ICD-10-CM | POA: Diagnosis not present

## 2023-01-14 LAB — BASIC METABOLIC PANEL
Anion gap: 6 (ref 5–15)
Anion gap: 8 (ref 5–15)
BUN: 23 mg/dL (ref 8–23)
BUN: 24 mg/dL — ABNORMAL HIGH (ref 8–23)
CO2: 22 mmol/L (ref 22–32)
CO2: 20 mmol/L — ABNORMAL LOW (ref 22–32)
Calcium: 8 mg/dL — ABNORMAL LOW (ref 8.9–10.3)
Calcium: 7.7 mg/dL — ABNORMAL LOW (ref 8.9–10.3)
Chloride: 106 mmol/L (ref 98–111)
Chloride: 105 mmol/L (ref 98–111)
Creatinine, Ser: 1.47 mg/dL — ABNORMAL HIGH (ref 0.44–1.00)
Creatinine, Ser: 1.41 mg/dL — ABNORMAL HIGH (ref 0.44–1.00)
GFR, Estimated: 38 mL/min — ABNORMAL LOW (ref >60)
GFR, Estimated: 40 mL/min — ABNORMAL LOW (ref >60)
Glucose, Bld: 118 mg/dL — ABNORMAL HIGH (ref 70–99)
Glucose, Bld: 117 mg/dL — ABNORMAL HIGH (ref 70–99)
Potassium: 5.1 mmol/L (ref 3.5–5.1)
Potassium: 4.4 mmol/L (ref 3.5–5.1)
Sodium: 134 mmol/L — ABNORMAL LOW (ref 135–145)
Sodium: 133 mmol/L — ABNORMAL LOW (ref 135–145)

## 2023-01-14 LAB — CBC
HCT: 25.8 % — ABNORMAL LOW (ref 36.0–46.0)
Hemoglobin: 8.6 g/dL — ABNORMAL LOW (ref 12.0–15.0)
MCH: 31.9 pg (ref 26.0–34.0)
MCHC: 33.3 g/dL (ref 30.0–36.0)
MCV: 95.6 fL (ref 80.0–100.0)
Platelets: 213 10*3/uL (ref 150–400)
RBC: 2.7 MIL/uL — ABNORMAL LOW (ref 3.87–5.11)
RDW: 13.5 % (ref 11.5–15.5)
WBC: 11.1 10*3/uL — ABNORMAL HIGH (ref 4.0–10.5)
nRBC: 0 % (ref 0.0–0.2)

## 2023-01-14 MED ORDER — POLYETHYLENE GLYCOL 3350 17 GM/SCOOP PO POWD
17.0000 g | Freq: Every day | ORAL | 0 refills | Status: DC | PRN
  Filled 2023-01-14: qty 238, 14d supply, fill #0

## 2023-01-14 MED ORDER — DOCUSATE SODIUM 100 MG PO CAPS
100.0000 mg | ORAL_CAPSULE | Freq: Two times a day (BID) | ORAL | 0 refills | Status: DC
  Filled 2023-01-14: qty 60, 30d supply, fill #0

## 2023-01-14 MED ORDER — SENNA 8.6 MG PO TABS
2.0000 | ORAL_TABLET | Freq: Every day | ORAL | 0 refills | Status: AC
  Filled 2023-01-14: qty 30, 15d supply, fill #0

## 2023-01-14 MED ORDER — ONDANSETRON HCL 4 MG PO TABS
4.0000 mg | ORAL_TABLET | Freq: Three times a day (TID) | ORAL | 0 refills | Status: AC | PRN
  Filled 2023-01-14: qty 30, 10d supply, fill #0

## 2023-01-14 MED ORDER — ASPIRIN 81 MG PO CHEW
81.0000 mg | CHEWABLE_TABLET | Freq: Two times a day (BID) | ORAL | 0 refills | Status: DC
  Filled 2023-01-14: qty 90, 45d supply, fill #0

## 2023-01-14 MED ORDER — HYDROCODONE-ACETAMINOPHEN 5-325 MG PO TABS
1.0000 | ORAL_TABLET | ORAL | 0 refills | Status: AC | PRN
  Filled 2023-01-14: qty 42, 7d supply, fill #0

## 2023-01-14 NOTE — Plan of Care (Signed)
  Problem: Activity: Goal: Ability to avoid complications of mobility impairment will improve Outcome: Progressing   Problem: Activity: Goal: Ability to avoid complications of mobility impairment will improve Outcome: Progressing   Problem: Activity: Goal: Ability to avoid complications of mobility impairment will improve Outcome: Progressing   Problem: Activity: Goal: Ability to avoid complications of mobility impairment will improve Outcome: Progressing

## 2023-01-14 NOTE — Discharge Summary (Signed)
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                  Signed: Clois Dupes 01/14/2023, 3:30 PM

## 2023-01-14 NOTE — TOC Transition Note (Signed)
Transition of Care [facility]) - CM/SW Discharge Note  Patient Details  Name: [name] MRN: [id number] Date of Birth: 07/20/1950  Transition of Care [facility]) CM/SW Contact:  Ewing Schlein, LCSW Phone Number: 01/14/2023, 10:56 AM  Clinical Narrative: Patient is expected to discharge home after working with PT. CSW met with patient to confirm discharge plan and needs. Patient will go home with a home exercise program (HEP). Patient will need a rolling walker, which was delivered to patient's room by [employer]. TOC signing off.  Final next level of care: Home/Self Care Barriers to Discharge: No Barriers Identified  Patient Goals and CMS Choice CMS Medicare.gov Compare Post Acute Care list provided to:: Patient Choice offered to / list presented to : Patient  Discharge Plan and Services Additional resources added to the After Visit Summary for   DME Arranged: Walker rolling DME Agency: [employer] Representative spoke with at DME Agency: Prearranged in orthopedist's office HH Arranged: NA HH Agency: NA  Social Determinants of Health (SDOH) Interventions SDOH Screenings   Food Insecurity: Food Insecurity Present (11/24/2022)  Housing: Low Risk  (11/20/2022)  Transportation Needs: Unmet Transportation Needs (11/24/2022)  Utilities: Not At Risk (11/21/2022)  Alcohol Screen: Low Risk  (12/15/2021)  Depression (PHQ2-9): High Risk (09/09/2022)  Financial Resource Strain: Low Risk  (12/15/2021)  Physical Activity: Sufficiently Active (12/15/2021)  Social Connections: Moderately Integrated (12/15/2021)  Stress: No Stress Concern Present (12/15/2021)  Tobacco Use: Low Risk  (01/13/2023)   Readmission Risk Interventions     No data to display

## 2023-01-14 NOTE — Progress Notes (Signed)
Physical Therapy Treatment Patient Details Name: [name] MRN: [id number] DOB: [birth year] Today's Date: 01/14/2023   History of Present Illness Pt is a 72 year old female s/p Right THA direct anterior approach on 01/13/23.  PMHx significant for but not limited to: cervical fusion, bipolar disorder, depression, COPD, CKD, fibromyalgia, anxiety    PT Comments  Pt ambulated in hallway, practiced steps, and performed LE exercises.  Pt provided with HEP handout.  Pt reports recheck of CMP today but is ready from mobility standpoint to d/c home today.     If plan is discharge home, recommend the following: Help with stairs or ramp for entrance   Can travel by private vehicle        Equipment Recommendations  Rolling walker (2 wheels)    Recommendations for Other Services       Precautions / Restrictions Precautions Precautions: Fall     Mobility  Bed Mobility Overal bed mobility: Needs Assistance Bed Mobility: Supine to Sit     Supine to sit: HOB elevated, Supervision          Transfers Overall transfer level: Needs assistance Equipment used: Rolling walker (2 wheels) Transfers: Sit to/from Stand Sit to Stand: Contact guard assist           General transfer comment: verbal cues for hand placement    Ambulation/Gait Ambulation/Gait assistance: Contact guard assist Gait Distance (Feet): 160 Feet Assistive device: Rolling walker (2 wheels) Gait Pattern/deviations: Step-to pattern, Decreased stance time - right, Antalgic       General Gait Details: verbal cues for sequence, RW Positioning, step length, posture   Stairs Stairs: Yes Stairs assistance: Contact guard assist Stair Management: Step to pattern, Two rails, Forwards Number of Stairs: 3 General stair comments: verbal cues for safety and sequence; pt reports understanding   Wheelchair Mobility     Tilt Bed    Modified Rankin (Stroke Patients Only)       Balance                                             Cognition Arousal: Alert Behavior During Therapy: WFL for tasks assessed/performed Overall Cognitive Status: Within Functional Limits for tasks assessed                                          Exercises Total Joint Exercises Ankle Circles/Pumps: AROM, Both, 10 reps Quad Sets: AROM, Both, 10 reps Heel Slides: AAROM, Right, 10 reps Hip ABduction/ADduction: AROM, Standing, Right, 10 reps Long Arc Quad: AROM, Right, 10 reps, Seated Knee Flexion: AROM, Standing, Right, 10 reps Marching in Standing: AROM, 10 reps, Right, Standing Standing Hip Extension: AROM, Right, 10 reps, Standing    General Comments        Pertinent Vitals/Pain Pain Assessment Pain Assessment: 0-10 Pain Score: 6  Pain Location: right hip Pain Descriptors / Indicators: Sore, Guarding Pain Intervention(s): Monitored during session, Repositioned    Home Living                          Prior Function            PT Goals (current goals can now be found in the care plan section) Progress towards PT goals: Progressing  toward goals    Frequency    7X/week      PT Plan      Co-evaluation              AM-PAC PT "6 Clicks" Mobility   Outcome Measure  Help needed turning from your back to your side while in a flat bed without using bedrails?: A Little Help needed moving from lying on your back to sitting on the side of a flat bed without using bedrails?: A Little Help needed moving to and from a bed to a chair (including a wheelchair)?: A Little Help needed standing up from a chair using your arms (e.g., wheelchair or bedside chair)?: A Little Help needed to walk in hospital room?: A Little Help needed climbing 3-5 steps with a railing? : A Little 6 Click Score: 18    End of Session Equipment Utilized During Treatment: Gait belt Activity Tolerance: Patient tolerated treatment well Patient left: in chair;with call  bell/phone within reach;with chair alarm set   PT Visit Diagnosis: Difficulty in walking, not elsewhere classified (R26.2)     Time: 1610-9604 PT Time Calculation (min) (ACUTE ONLY): 29 min  Charges:    $Gait Training: 8-22 mins $Therapeutic Exercise: 8-22 mins PT General Charges $$ ACUTE PT VISIT: 1 Visit                    Paulino Door, DPT Physical Therapist Acute Rehabilitation Services Office: [phone]   Kati L Payson 01/14/2023, 12:12 PM

## 2023-01-14 NOTE — Progress Notes (Signed)
AVS reviewed w/ Corrie Dandy  who verbalized an understanding- no other questions at this time - only 1 PIV removed from  left hand - no other PV's  present at this time. Home meds delivered to pt. Pt's friend enroute to the hospital to pick her up

## 2023-01-14 NOTE — Care Management Obs Status (Signed)
MEDICARE OBSERVATION STATUS NOTIFICATION   Patient Details  Name: [name] MRN: [id number] Date of Birth: 08-Dec-1950   Medicare Observation Status Notification Given:  Yes    Ewing Schlein, LCSW 01/14/2023, 11:37 AM
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
## 2023-01-14 NOTE — Progress Notes (Addendum)
    Subjective:  Patient reports pain as mild.  Denies N/V/CP/SOB/Abd pain. She reports that her hip is excellent. She is not having much pain. She reports walking with PT yesterday was excellent. She denies any tingling or numbness in LE bilaterally.   Objective:   VITALS:   Vitals:   01/13/23 1754 01/13/23 2231 01/14/23 0107 01/14/23 0538  BP: 139/78 (!) 124/93 112/61 116/75  Pulse: 77 65 62 60  Resp: 18 17 17 17   Temp: 97.8 F (36.6 C) (!) 97.4 F (36.3 C) (!) 97.3 F (36.3 C) 98 F (36.7 C)  TempSrc: Oral   Oral  SpO2: 98% 98% 97% 100%  Weight:      Height:        Patient sitting up in bed. NAD.  Neurologically intact ABD soft Neurovascular intact Sensation intact distally Intact pulses distally Dorsiflexion/Plantar flexion intact Incision: dressing C/D/I No cellulitis present Compartment soft   Lab Results  Component Value Date   WBC 11.1 (H) 01/14/2023   HGB 8.6 (L) 01/14/2023   HCT 25.8 (L) 01/14/2023   MCV 95.6 01/14/2023   PLT 213 01/14/2023   BMET    Component Value Date/Time   NA 134 (L) 01/14/2023 0343   NA 135 10/29/2022 1520   K 5.1 01/14/2023 0343   CL 106 01/14/2023 0343   CO2 22 01/14/2023 0343   GLUCOSE 118 (H) 01/14/2023 0343   BUN 23 01/14/2023 0343   BUN 11 10/29/2022 1520   BUN 19.9 08/11/2015 1343   CREATININE 1.47 (H) 01/14/2023 0343   CREATININE 1.53 (H) 07/31/2021 1319   CREATININE 1.0 08/11/2015 1343   CALCIUM 8.0 (L) 01/14/2023 0343   EGFR 48 (L) 10/29/2022 1520   GFRNONAA 38 (L) 01/14/2023 0343   GFRNONAA 36 (L) 07/31/2021 1319     Assessment/Plan: 1 Day Post-Op   Principal Problem:   Osteoarthritis of right hip Active Problems:   S/P total right hip arthroplasty    WBAT with walker DVT ppx: Aspirin, SCDs, TEDS PO pain control PT/OT: Patient ambulated 35 feet with PT yesterday. Continue PT today.  Dispo:  - ABLA. Hemoglobin. 8.6. Continue to monitor.  - CKD history. Creatinine 1.47 this morning (1.13  8/20). Continue IV fluids and recheck BMP at 2:00pm. If trending down can d/c home.  - D/c home with HEP once cleared with PT and improved creatinine.    ADDEND: BMP creatinine trending downwards. Continue to push PO fluids. D/c home with HEP.   Clois Dupes, PA-C 01/14/2023, 8:20 AM   [facility]  [place] [address], [address], [place], [place] [address] Phone: [phone] [email or web address] Facebook  [employer]

## 2023-01-14 NOTE — Anesthesia Postprocedure Evaluation (Signed)
Anesthesia Post Note  Patient: [name]  Procedure(s) Performed: TOTAL HIP ARTHROPLASTY ANTERIOR APPROACH (Right: Hip)     Patient location during evaluation: PACU Anesthesia Type: MAC and Spinal Level of consciousness: awake and alert Pain management: pain level controlled Vital Signs Assessment: post-procedure vital signs reviewed and stable Respiratory status: spontaneous breathing, nonlabored ventilation, respiratory function stable and patient connected to nasal cannula oxygen Cardiovascular status: stable and blood pressure returned to baseline Postop Assessment: no apparent nausea or vomiting Anesthetic complications: no   No notable events documented.  Last Vitals:  Vitals:   01/14/23 1040 01/14/23 1429  BP: 130/70 (!) 103/59  Pulse: 62 62  Resp: 17 18  Temp: 36.6 C 36.6 C  SpO2: 99% 96%    Last Pain:  Vitals:   01/14/23 1015  TempSrc:   PainSc: 4                  Delayne Sanzo P Michaeljoseph Revolorio

## 2023-01-18 ENCOUNTER — Emergency Department (HOSPITAL_COMMUNITY): Payer: 59

## 2023-01-18 ENCOUNTER — Observation Stay (HOSPITAL_COMMUNITY)
Admission: EM | Admit: 2023-01-18 | Discharge: 2023-01-20 | Disposition: A | Discharge status: Home Health | Payer: 59 | Attending: Internal Medicine | Admitting: Internal Medicine

## 2023-01-18 ENCOUNTER — Encounter (HOSPITAL_COMMUNITY): Payer: Self-pay | Admitting: Family Medicine

## 2023-01-18 ENCOUNTER — Observation Stay (HOSPITAL_COMMUNITY): Payer: 59

## 2023-01-18 ENCOUNTER — Other Ambulatory Visit: Payer: Self-pay

## 2023-01-18 DIAGNOSIS — R55 Syncope and collapse: Secondary | ICD-10-CM | POA: Diagnosis not present

## 2023-01-18 DIAGNOSIS — M1712 Unilateral primary osteoarthritis, left knee: Secondary | ICD-10-CM | POA: Diagnosis not present

## 2023-01-18 DIAGNOSIS — M25561 Pain in right knee: Secondary | ICD-10-CM | POA: Diagnosis not present

## 2023-01-18 DIAGNOSIS — I129 Hypertensive chronic kidney disease with stage 1 through stage 4 chronic kidney disease, or unspecified chronic kidney disease: Secondary | ICD-10-CM | POA: Insufficient documentation

## 2023-01-18 DIAGNOSIS — J45909 Unspecified asthma, uncomplicated: Secondary | ICD-10-CM | POA: Insufficient documentation

## 2023-01-18 DIAGNOSIS — E039 Hypothyroidism, unspecified: Secondary | ICD-10-CM

## 2023-01-18 DIAGNOSIS — Z7982 Long term (current) use of aspirin: Secondary | ICD-10-CM | POA: Insufficient documentation

## 2023-01-18 DIAGNOSIS — R109 Unspecified abdominal pain: Secondary | ICD-10-CM | POA: Diagnosis not present

## 2023-01-18 DIAGNOSIS — Z471 Aftercare following joint replacement surgery: Secondary | ICD-10-CM | POA: Diagnosis not present

## 2023-01-18 DIAGNOSIS — M25572 Pain in left ankle and joints of left foot: Secondary | ICD-10-CM | POA: Diagnosis not present

## 2023-01-18 DIAGNOSIS — M25551 Pain in right hip: Secondary | ICD-10-CM | POA: Diagnosis not present

## 2023-01-18 DIAGNOSIS — Z96641 Presence of right artificial hip joint: Secondary | ICD-10-CM | POA: Insufficient documentation

## 2023-01-18 DIAGNOSIS — M797 Fibromyalgia: Secondary | ICD-10-CM | POA: Diagnosis present

## 2023-01-18 DIAGNOSIS — F332 Major depressive disorder, recurrent severe without psychotic features: Secondary | ICD-10-CM

## 2023-01-18 DIAGNOSIS — F411 Generalized anxiety disorder: Secondary | ICD-10-CM

## 2023-01-18 DIAGNOSIS — R404 Transient alteration of awareness: Secondary | ICD-10-CM | POA: Diagnosis not present

## 2023-01-18 DIAGNOSIS — M25562 Pain in left knee: Secondary | ICD-10-CM | POA: Diagnosis not present

## 2023-01-18 DIAGNOSIS — N1832 Chronic kidney disease, stage 3b: Secondary | ICD-10-CM

## 2023-01-18 DIAGNOSIS — I10 Essential (primary) hypertension: Secondary | ICD-10-CM | POA: Diagnosis not present

## 2023-01-18 DIAGNOSIS — D649 Anemia, unspecified: Principal | ICD-10-CM | POA: Diagnosis present

## 2023-01-18 DIAGNOSIS — F32A Depression, unspecified: Secondary | ICD-10-CM | POA: Insufficient documentation

## 2023-01-18 DIAGNOSIS — Z86718 Personal history of other venous thrombosis and embolism: Secondary | ICD-10-CM | POA: Insufficient documentation

## 2023-01-18 DIAGNOSIS — J449 Chronic obstructive pulmonary disease, unspecified: Secondary | ICD-10-CM | POA: Insufficient documentation

## 2023-01-18 DIAGNOSIS — M25462 Effusion, left knee: Secondary | ICD-10-CM | POA: Diagnosis not present

## 2023-01-18 DIAGNOSIS — R110 Nausea: Secondary | ICD-10-CM | POA: Diagnosis not present

## 2023-01-18 DIAGNOSIS — S3993XA Unspecified injury of pelvis, initial encounter: Secondary | ICD-10-CM | POA: Diagnosis not present

## 2023-01-18 DIAGNOSIS — Z743 Need for continuous supervision: Secondary | ICD-10-CM | POA: Diagnosis not present

## 2023-01-18 LAB — COMPREHENSIVE METABOLIC PANEL
ALT: 13 U/L (ref 0–44)
AST: 36 U/L (ref 15–41)
Albumin: 2.7 g/dL — ABNORMAL LOW (ref 3.5–5.0)
Alkaline Phosphatase: 83 U/L (ref 38–126)
Anion gap: 9 (ref 5–15)
BUN: 20 mg/dL (ref 8–23)
CO2: 22 mmol/L (ref 22–32)
Calcium: 8 mg/dL — ABNORMAL LOW (ref 8.9–10.3)
Chloride: 108 mmol/L (ref 98–111)
Creatinine, Ser: 1.34 mg/dL — ABNORMAL HIGH (ref 0.44–1.00)
GFR, Estimated: 42 mL/min — ABNORMAL LOW (ref >60)
Glucose, Bld: 91 mg/dL (ref 70–99)
Potassium: 4.4 mmol/L (ref 3.5–5.1)
Sodium: 139 mmol/L (ref 135–145)
Total Bilirubin: 0.6 mg/dL (ref 0.3–1.2)
Total Protein: 5.9 g/dL — ABNORMAL LOW (ref 6.5–8.1)

## 2023-01-18 LAB — CBC WITH DIFFERENTIAL/PLATELET
Abs Immature Granulocytes: 0.06 10*3/uL (ref 0.00–0.07)
Basophils Absolute: 0 10*3/uL (ref 0.0–0.1)
Basophils Relative: 1 %
Eosinophils Absolute: 0.4 10*3/uL (ref 0.0–0.5)
Eosinophils Relative: 5 %
HCT: 22.1 % — ABNORMAL LOW (ref 36.0–46.0)
Hemoglobin: 7.1 g/dL — ABNORMAL LOW (ref 12.0–15.0)
Immature Granulocytes: 1 %
Lymphocytes Relative: 15 %
Lymphs Abs: 1 10*3/uL (ref 0.7–4.0)
MCH: 31.4 pg (ref 26.0–34.0)
MCHC: 32.1 g/dL (ref 30.0–36.0)
MCV: 97.8 fL (ref 80.0–100.0)
Monocytes Absolute: 0.6 10*3/uL (ref 0.1–1.0)
Monocytes Relative: 8 %
Neutro Abs: 4.9 10*3/uL (ref 1.7–7.7)
Neutrophils Relative %: 70 %
Platelets: 327 10*3/uL (ref 150–400)
RBC: 2.26 MIL/uL — ABNORMAL LOW (ref 3.87–5.11)
RDW: 14 % (ref 11.5–15.5)
WBC: 6.9 10*3/uL (ref 4.0–10.5)
nRBC: 0 % (ref 0.0–0.2)

## 2023-01-18 LAB — MAGNESIUM: Magnesium: 1.5 mg/dL — ABNORMAL LOW (ref 1.7–2.4)

## 2023-01-18 LAB — PREPARE RBC (CROSSMATCH)

## 2023-01-18 MED ORDER — CLONIDINE HCL 0.1 MG PO TABS
0.2000 mg | ORAL_TABLET | Freq: Every day | ORAL | Status: DC
  Administered 2023-01-18 – 2023-01-19 (×2): 0.2 mg via ORAL
  Filled 2023-01-18 (×2): qty 2

## 2023-01-18 MED ORDER — ALPRAZOLAM 0.5 MG PO TABS
1.0000 mg | ORAL_TABLET | Freq: Three times a day (TID) | ORAL | Status: DC | PRN
  Administered 2023-01-18 – 2023-01-20 (×4): 1 mg via ORAL
  Filled 2023-01-18 (×4): qty 2

## 2023-01-18 MED ORDER — FENTANYL CITRATE PF 50 MCG/ML IJ SOSY
12.5000 ug | PREFILLED_SYRINGE | INTRAMUSCULAR | Status: DC | PRN
  Administered 2023-01-18 – 2023-01-19 (×2): 50 ug via INTRAVENOUS
  Filled 2023-01-18 (×2): qty 1

## 2023-01-18 MED ORDER — ACETAMINOPHEN 650 MG RE SUPP: 650.0000 mg | Freq: Four times a day (QID) | RECTAL | Status: DC | PRN

## 2023-01-18 MED ORDER — ONDANSETRON HCL 4 MG/2ML IJ SOLN: 4.0000 mg | Freq: Four times a day (QID) | INTRAMUSCULAR | Status: DC | PRN

## 2023-01-18 MED ORDER — MAGNESIUM SULFATE 2 GM/50ML IV SOLN
2.0000 g | Freq: Once | INTRAVENOUS | Status: AC
  Administered 2023-01-18: 2 g via INTRAVENOUS
  Filled 2023-01-18: qty 50

## 2023-01-18 MED ORDER — LEVOTHYROXINE SODIUM 100 MCG PO TABS
100.0000 ug | ORAL_TABLET | Freq: Every day | ORAL | Status: DC
  Administered 2023-01-19 – 2023-01-20 (×2): 100 ug via ORAL
  Filled 2023-01-18 (×2): qty 1

## 2023-01-18 MED ORDER — BUSPIRONE HCL 10 MG PO TABS
30.0000 mg | ORAL_TABLET | Freq: Two times a day (BID) | ORAL | Status: DC
  Administered 2023-01-18 – 2023-01-20 (×4): 30 mg via ORAL
  Filled 2023-01-18 (×2): qty 3
  Filled 2023-01-18: qty 6
  Filled 2023-01-18: qty 3

## 2023-01-18 MED ORDER — OXYCODONE HCL 5 MG PO TABS
5.0000 mg | ORAL_TABLET | ORAL | Status: DC | PRN
  Administered 2023-01-18 – 2023-01-19 (×6): 5 mg via ORAL
  Filled 2023-01-18 (×6): qty 1

## 2023-01-18 MED ORDER — ROSUVASTATIN CALCIUM 20 MG PO TABS
10.0000 mg | ORAL_TABLET | Freq: Every day | ORAL | Status: DC
  Administered 2023-01-18 – 2023-01-19 (×2): 10 mg via ORAL
  Filled 2023-01-18: qty 1
  Filled 2023-01-18 (×2): qty 0.5
  Filled 2023-01-18: qty 1
  Filled 2023-01-18: qty 0.5

## 2023-01-18 MED ORDER — LOSARTAN POTASSIUM 25 MG PO TABS
50.0000 mg | ORAL_TABLET | Freq: Every day | ORAL | Status: DC
  Administered 2023-01-19 – 2023-01-20 (×2): 50 mg via ORAL
  Filled 2023-01-18 (×2): qty 2

## 2023-01-18 MED ORDER — ONDANSETRON HCL 4 MG PO TABS: 4.0000 mg | ORAL_TABLET | Freq: Four times a day (QID) | ORAL | Status: DC | PRN

## 2023-01-18 MED ORDER — POLYETHYLENE GLYCOL 3350 17 G PO PACK
17.0000 g | PACK | Freq: Every day | ORAL | Status: DC | PRN
  Administered 2023-01-19: 17 g via ORAL
  Filled 2023-01-18: qty 1

## 2023-01-18 MED ORDER — MIRTAZAPINE 30 MG PO TABS
30.0000 mg | ORAL_TABLET | Freq: Every day | ORAL | Status: DC
  Administered 2023-01-18 – 2023-01-19 (×2): 30 mg via ORAL
  Filled 2023-01-18 (×2): qty 2
  Filled 2023-01-18 (×3): qty 1

## 2023-01-18 MED ORDER — SODIUM CHLORIDE 0.9 % IV BOLUS
1000.0000 mL | Freq: Once | INTRAVENOUS | Status: AC
  Administered 2023-01-18: 1000 mL via INTRAVENOUS

## 2023-01-18 MED ORDER — ACETAMINOPHEN 325 MG PO TABS
650.0000 mg | ORAL_TABLET | Freq: Four times a day (QID) | ORAL | Status: DC | PRN
  Administered 2023-01-19 (×2): 650 mg via ORAL
  Filled 2023-01-18 (×2): qty 2

## 2023-01-18 MED ORDER — PANTOPRAZOLE SODIUM 40 MG PO TBEC
40.0000 mg | DELAYED_RELEASE_TABLET | Freq: Every day | ORAL | Status: DC
  Administered 2023-01-19 – 2023-01-20 (×2): 40 mg via ORAL
  Filled 2023-01-18 (×2): qty 1

## 2023-01-18 MED ORDER — SODIUM CHLORIDE 0.9% FLUSH
3.0000 mL | Freq: Two times a day (BID) | INTRAVENOUS | Status: DC
  Administered 2023-01-18 – 2023-01-20 (×4): 3 mL via INTRAVENOUS

## 2023-01-18 MED ORDER — IPRATROPIUM-ALBUTEROL 0.5-2.5 (3) MG/3ML IN SOLN: 3.0000 mL | Freq: Four times a day (QID) | RESPIRATORY_TRACT | Status: DC | PRN

## 2023-01-18 MED ORDER — FLUOXETINE HCL 20 MG PO CAPS
40.0000 mg | ORAL_CAPSULE | Freq: Every morning | ORAL | Status: DC
  Administered 2023-01-19 – 2023-01-20 (×2): 40 mg via ORAL
  Filled 2023-01-18 (×2): qty 2

## 2023-01-18 MED ORDER — CARVEDILOL 12.5 MG PO TABS
12.5000 mg | ORAL_TABLET | Freq: Two times a day (BID) | ORAL | Status: DC
  Administered 2023-01-19 – 2023-01-20 (×3): 12.5 mg via ORAL
  Filled 2023-01-18 (×3): qty 1

## 2023-01-18 MED ORDER — TIZANIDINE HCL 2 MG PO TABS
2.0000 mg | ORAL_TABLET | Freq: Once | ORAL | Status: AC
  Administered 2023-01-19: 2 mg via ORAL
  Filled 2023-01-18: qty 1

## 2023-01-18 MED ORDER — ASPIRIN 81 MG PO CHEW
81.0000 mg | CHEWABLE_TABLET | Freq: Two times a day (BID) | ORAL | Status: DC
  Administered 2023-01-19 – 2023-01-20 (×3): 81 mg via ORAL
  Filled 2023-01-18 (×3): qty 1

## 2023-01-18 MED ORDER — SODIUM CHLORIDE 0.9% IV SOLUTION: Freq: Once | INTRAVENOUS | Status: AC

## 2023-01-18 NOTE — ED Triage Notes (Signed)
BIBA from home for hip pain. Pt had a recent right hip replacement- Pt slipped and fell today, now has right hip pain.Pt did not hit their head, and is not on any thinners. 100 mcg fentanyl, 4 mg zofran given PTA. 20 g lac 142/78 BP 66 HR 100% room air 110 cbg

## 2023-01-18 NOTE — Progress Notes (Signed)
   01/18/23 2200  Spiritual Encounters  Type of Visit Follow up  Care provided to: Patient  Conversation partners present during encounter Nurse  Referral source Patient request  Reason for visit Urgent spiritual support  OnCall Visit Yes  Spiritual Framework  Presenting Themes Meaning/purpose/sources of inspiration;Goals in life/care;Values and beliefs;Significant life change;Caregiving needs;Coping tools;Impactful experiences and emotions;Courage hope and growth;Rituals and practive  Patient Stress Factors Health changes;Loss of control  Family Stress Factors None identified  Interventions  Spiritual Care Interventions Made Established relationship of care and support;Compassionate presence;Reflective listening;Normalization of emotions;Explored ethical dilemma;Reconciliation with self/others;Narrative/life review;Meaning making;Mindfulness intervention;Explored values/beliefs/practices/strengths  Spiritual Care Plan  Spiritual Care Issues Still Outstanding Chaplain will continue to follow   Chaplain met with [name] at bedside this early evening.  She requested visit, she spoke at length of her life faith narrative and how faith was a part of her life mission.  She spoke at length of her medical issues and her Delima of her future care.  Provided spiritual care in the form of counsel, prayer, scripture reading and signing.

## 2023-01-18 NOTE — ED Provider Notes (Signed)
[place] EMERGENCY DEPARTMENT AT [facility] Provider Note   CSN: [id number] Arrival date & time: 01/18/23  1130     History  Chief Complaint  Patient presents with   Hip Pain    [name] is a 72 y.o. female.  HPI Patient presents 5 days after right hip repair, now with concern for an episode of syncope with ongoing hip pain.  Patient has been recovering from her hip surgery uneventfully, but has been weak, with hip pain.  Today she had episode of syncope, and upon awakening on the ground, found to have on the right side with worsening right hip pain.  No current lightheadedness, no chest pain throughout, no additional episodes of syncope.    Home Medications Prior to Admission medications   Medication Sig Start Date End Date Taking? Authorizing Provider  ALPRAZolam (XANAX) 1 MG tablet TAKE 1 TABLET BY MOUTH 3 TIMES DAILY AS NEEDED FOR ANXIETY. 01/06/23   Plotnikov, Georgina Quint, MD  ascorbic acid (VITAMIN C) 500 MG tablet Take 1 tablet (500 mg total) by mouth daily. 01/07/23   Plotnikov, Georgina Quint, MD  aspirin (ASPIRIN CHILDRENS) 81 MG chewable tablet Chew 1 tablet (81 mg total) by mouth 2 (two) times daily with a meal. 01/14/23 02/28/23  Hill, Alain Honey, PA-C  busPIRone (BUSPAR) 30 MG tablet Take 1 tablet (30 mg total) by mouth 2 (two) times daily. 01/12/23   Plotnikov, Georgina Quint, MD  carvedilol (COREG) 12.5 MG tablet Take 1 tablet (12.5 mg total) by mouth 2 (two) times daily with a meal. 01/11/23 02/10/23  Tobb, Kardie, DO  Cholecalciferol (D3 5000) 125 MCG (5000 UT) capsule Take 1 capsule (5,000 Units total) by mouth daily. 01/07/23   Plotnikov, Georgina Quint, MD  cloNIDine (CATAPRES) 0.2 MG tablet Take 1 tablet (0.2 mg total) by mouth at bedtime. 01/11/23   Tobb, Lavona Mound, DO  colesevelam (WELCHOL) 625 MG tablet Take 2 tablets (1,250 mg total) by mouth 2 (two) times daily with a meal. 01/12/23 01/12/24  Plotnikov, Georgina Quint, MD  cyanocobalamin (VITAMIN B12) 1000 MCG tablet  Take 1 tablet (1,000 mcg total) by mouth daily. Take 1,000 mcg by mouth daily. 01/07/23   Plotnikov, Georgina Quint, MD  D-Mannose 500 MG CAPS Take 500 mg by mouth 2 (two) times daily. WITH CRANBERRY & DANDELION EXTRACT 01/07/23   Plotnikov, Georgina Quint, MD  docusate sodium (COLACE) 100 MG capsule Take 1 capsule (100 mg total) by mouth 2 (two) times daily. 01/14/23 02/13/23  Clois Dupes, PA-C  esomeprazole (NEXIUM) 40 MG capsule Take 1 capsule (40 mg total) by mouth every morning. 01/12/23   Plotnikov, Georgina Quint, MD  FLUoxetine (PROZAC) 40 MG capsule Take 1 capsule (40 mg total) by mouth every morning. 01/12/23   Plotnikov, Georgina Quint, MD  hydrALAZINE (APRESOLINE) 10 MG tablet Take 1 tablet (10 mg total) by mouth 4 (four) times daily as needed. 12/03/22   Plotnikov, Georgina Quint, MD  HYDROcodone-acetaminophen (NORCO/VICODIN) 5-325 MG tablet Take 1 tablet by mouth every 4 (four) hours as needed for up to 7 days for moderate pain or severe pain. 01/14/23 01/21/23  Clois Dupes, PA-C  ipratropium-albuterol (DUONEB) 0.5-2.5 (3) MG/3ML SOLN Take 3 mLs by nebulization every 6 (six) hours as needed (Asthma).    [provider]  iron polysaccharides (POLY-IRON 150) 150 MG capsule Take 1 capsule (150 mg total) by mouth daily. 01/07/23   Plotnikov, Georgina Quint, MD  Lactobacillus (ACIDOPHILUS) CAPS capsule Take 1 capsule by mouth  daily. 01/07/23   Plotnikov, Georgina Quint, MD  levothyroxine (SYNTHROID) 100 MCG tablet Take 1 tablet (100 mcg total) by mouth daily before breakfast. 01/12/23   Plotnikov, Georgina Quint, MD  loperamide (IMODIUM A-D) 2 MG tablet Take 2 tablets (4 mg total) by mouth 4 (four) times daily as needed for diarrhea or loose stools. 01/07/23   Plotnikov, Georgina Quint, MD  losartan (COZAAR) 50 MG tablet Take 1 tablet (50 mg total) by mouth daily. 01/11/23   Tobb, Kardie, DO  Metamucil Fiber CHEW Chew 2 tablets by mouth in the morning and at bedtime. 01/07/23   Plotnikov, Georgina Quint, MD  mirtazapine (REMERON SOL-TAB) 45 MG  disintegrating tablet Dissolve 1 tablet (45 mg total) by mouth at bedtime. 01/03/23     mirtazapine (REMERON) 30 MG tablet Take 1 tablet (30 mg total) by mouth at bedtime. 01/12/23   Plotnikov, Georgina Quint, MD  omega-3 acid ethyl esters (LOVAZA) 1 g capsule Take 2 capsules (2 g total) by mouth 2 (two) times daily. 01/12/23   Plotnikov, Georgina Quint, MD  ondansetron (ZOFRAN) 4 MG tablet Take 1 tablet (4 mg total) by mouth every 8 (eight) hours as needed for nausea or vomiting. 01/14/23 01/14/24  Clois Dupes, PA-C  OVER THE COUNTER MEDICATION Take 1 capsule by mouth daily. 11 MUSHROOM BLEND    [provider]  Polyethyl Glycol-Propyl Glycol (SYSTANE) 0.4-0.3 % SOLN Place 1 drop into both eyes 3 (three) times daily as needed (dry eyes). Place 1 drop into both eyes 3 (three) times daily as needed (dry eyes). 01/07/23   Plotnikov, Georgina Quint, MD  polyethylene glycol powder (MIRALAX) 17 GM/SCOOP powder Take 17 g by mouth daily as needed for mild constipation or moderate constipation. 01/14/23 02/13/23  Clois Dupes, PA-C  rosuvastatin (CRESTOR) 10 MG tablet Take 1 tablet (10 mg total) by mouth daily. 01/11/23 04/11/23  Tobb, Lavona Mound, DO  senna (SENOKOT) 8.6 MG TABS tablet Take 2 tablets (17.2 mg total) by mouth at bedtime for 15 days. 01/14/23 01/29/23  Clois Dupes, PA-C  sodium chloride (OCEAN) 0.65 % SOLN nasal spray Place 1 spray into both nostrils as needed for congestion. 01/07/23   Plotnikov, Georgina Quint, MD  famotidine (PEPCID) 40 MG tablet TAKE 1 TABLET BY MOUTH EVERY DAY IN THE EVENING Patient not taking: No sig reported 04/03/19 11/05/19  Kozlow, Alvira Philips, MD      Allergies    Morphine and codeine, Amlodipine, Buprenorphine, Cephalexin, Other, Sulfamethoxazole-trimethoprim, Sumatriptan, Codeine, Dust mite extract, Molds & smuts, and Tape    Review of Systems   Review of Systems  All other systems reviewed and are negative.   Physical Exam Updated Vital Signs BP 134/76   Pulse 68   Temp 98.3 F  (36.8 C) (Oral)   Resp 17   SpO2 97%  Physical Exam Vitals and nursing note reviewed.  Constitutional:      General: She is not in acute distress.    Appearance: She is well-developed.  HENT:     Head: Normocephalic and atraumatic.  Eyes:     Conjunctiva/sclera: Conjunctivae normal.  Cardiovascular:     Rate and Rhythm: Normal rate and regular rhythm.  Pulmonary:     Effort: Pulmonary effort is normal. No respiratory distress.     Breath sounds: Normal breath sounds. No stridor.  Abdominal:     General: There is no distension.  Musculoskeletal:       Legs:  Skin:    General: Skin is warm  and dry.  Neurological:     Mental Status: She is alert and oriented to person, place, and time.     Cranial Nerves: No cranial nerve deficit.  Psychiatric:        Mood and Affect: Mood normal.     ED Results / Procedures / Treatments   Labs (all labs ordered are listed, but only abnormal results are displayed) Labs Reviewed - No data to display  EKG None  Radiology No results found.  Procedures Procedures    Medications Ordered in ED Medications - No data to display  ED Course/ Medical Decision Making/ A&P Clinical Course as of 01/18/23 1732  Tue Jan 18, 2023  1210 Magnesium [ES]  1319 Creatinine(!): 1.34 [ES]  1319 GFR, Estimated(!): 42 [ES]    Clinical Course User Index [ES] [employer]                                 Medical Decision Making Adult female presents placed procedure with concern for syncope.  Patient also has hip pain and there is concern for both syncope and trauma given she fell.  Evaluation included x-ray, CT pelvis after x-rays were nondiagnostic, labs.  Findings most consistent with anemia, 7.1 down from baseline 11, likely post procedure, as the patient has no other obvious source of bleeding.  Patient's musculoskeletal exam somewhat reassuring consistent with post procedure state.  However, given concern for symptomatic anemia  with syncope patient received blood transfusion will be admitted for further monitoring, management.  Amount and/or Complexity of Data Reviewed External Data Reviewed: notes.    Details: Surgical notes reviewed Labs: ordered. Decision-making details documented in ED Course. Radiology: ordered and independent interpretation performed. Decision-making details documented in ED Course. ECG/medicine tests: ordered and independent interpretation performed. Decision-making details documented in ED Course.  Risk Prescription drug management. Decision regarding hospitalization.  5:34 PM Patient accompanied by family members, all aware of all findings, need for admission, transfusion.  Final Clinical Impression(s) / ED Diagnoses Final diagnoses:  Syncope and collapse  Pain of right hip   CRITICAL CARE Performed by: Gerhard Munch Total critical care time: 35 minutes Critical care time was exclusive of separately billable procedures and treating other patients. Critical care was necessary to treat or prevent imminent or life-threatening deterioration. Critical care was time spent personally by me on the following activities: development of treatment plan with patient and/or surrogate as well as nursing, discussions with consultants, evaluation of patient's response to treatment, examination of patient, obtaining history from patient or surrogate, ordering and performing treatments and interventions, ordering and review of laboratory studies, ordering and review of radiographic studies, pulse oximetry and re-evaluation of patient's condition.    Gerhard Munch, MD 01/18/23 [phone]

## 2023-01-18 NOTE — H&P (Signed)
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 Communication: None present   Disposition Plan:  Patient is from: home  Anticipated d/c is to: home  Anticipated d/c date is: 9/4 or 01/20/23  Patient currently: Pending stable H&H, orthostatic vitals, cardiac monitoring  Consults called: None  Admission status: Observation     Briscoe Deutscher, MD [facility]  01/18/2023, 5:53 PM
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
## 2023-01-19 ENCOUNTER — Observation Stay (HOSPITAL_COMMUNITY): Payer: 59

## 2023-01-19 ENCOUNTER — Ambulatory Visit: Payer: 59 | Admitting: Internal Medicine

## 2023-01-19 ENCOUNTER — Telehealth: Payer: Self-pay

## 2023-01-19 ENCOUNTER — Ambulatory Visit (INDEPENDENT_AMBULATORY_CARE_PROVIDER_SITE_OTHER): Payer: 59 | Admitting: Psychology

## 2023-01-19 DIAGNOSIS — D649 Anemia, unspecified: Principal | ICD-10-CM

## 2023-01-19 DIAGNOSIS — F4321 Adjustment disorder with depressed mood: Secondary | ICD-10-CM | POA: Diagnosis not present

## 2023-01-19 DIAGNOSIS — K5900 Constipation, unspecified: Secondary | ICD-10-CM | POA: Diagnosis not present

## 2023-01-19 DIAGNOSIS — F4323 Adjustment disorder with mixed anxiety and depressed mood: Secondary | ICD-10-CM

## 2023-01-19 LAB — MAGNESIUM: Magnesium: 1.9 mg/dL (ref 1.7–2.4)

## 2023-01-19 LAB — BASIC METABOLIC PANEL
Anion gap: 8 (ref 5–15)
BUN: 21 mg/dL (ref 8–23)
CO2: 24 mmol/L (ref 22–32)
Calcium: 8.2 mg/dL — ABNORMAL LOW (ref 8.9–10.3)
Chloride: 104 mmol/L (ref 98–111)
Creatinine, Ser: 1.12 mg/dL — ABNORMAL HIGH (ref 0.44–1.00)
GFR, Estimated: 52 mL/min — ABNORMAL LOW (ref >60)
Glucose, Bld: 113 mg/dL — ABNORMAL HIGH (ref 70–99)
Potassium: 4 mmol/L (ref 3.5–5.1)
Sodium: 136 mmol/L (ref 135–145)

## 2023-01-19 LAB — CBC
HCT: 30.3 % — ABNORMAL LOW (ref 36.0–46.0)
Hemoglobin: 9.9 g/dL — ABNORMAL LOW (ref 12.0–15.0)
MCH: 31.1 pg (ref 26.0–34.0)
MCHC: 32.7 g/dL (ref 30.0–36.0)
MCV: 95.3 fL (ref 80.0–100.0)
Platelets: 259 10*3/uL (ref 150–400)
RBC: 3.18 MIL/uL — ABNORMAL LOW (ref 3.87–5.11)
RDW: 13.5 % (ref 11.5–15.5)
WBC: 6.8 10*3/uL (ref 4.0–10.5)
nRBC: 0 % (ref 0.0–0.2)

## 2023-01-19 MED ORDER — SODIUM CHLORIDE (PF) 0.9 % IJ SOLN
INTRAMUSCULAR | Status: AC
  Filled 2023-01-19: qty 50

## 2023-01-19 MED ORDER — IOHEXOL 9 MG/ML PO SOLN
500.0000 mL | ORAL | Status: AC
  Administered 2023-01-19 (×2): 500 mL via ORAL

## 2023-01-19 MED ORDER — IOHEXOL 300 MG/ML  SOLN
100.0000 mL | Freq: Once | INTRAMUSCULAR | Status: AC | PRN
  Administered 2023-01-19: 100 mL via INTRAVENOUS

## 2023-01-19 MED ORDER — IOHEXOL 9 MG/ML PO SOLN
ORAL | Status: AC
  Filled 2023-01-19: qty 1000

## 2023-01-19 NOTE — Progress Notes (Addendum)
The patient reached out to my office in the setting of her rehospitalization after hip surgery.  She is very concerned that her leiomyosarcoma has recurred.  She is wanting to have the CT scan that we were planning for this month for surveillance prior to leaving the hospital.  She is not very excited about the idea of rehab and if imaging were to show cancer recurrence, she would be more inclined to move forward with hospice at home.   I discussed getting the CT scan with her primary team, and this was performed this afternoon.   I went to see her just after she returned from the CT scan.  On my brief review of her images, I do not see obvious evidence of cancer recurrence.  Will await formal read.  Patient is open to discussion of rehab once CT report finalized.  Eugene Garnet MD Gynecologic Oncology

## 2023-01-19 NOTE — Telephone Encounter (Signed)
[name] called stating she is currently in the hospital, room 1511. She had a hip replacement on Thursday 8/29, She was sent home and her Hgb dropped causing her to pass out. She was admitted yesterday. She has a CT scheduled for Thursday 9/6, (declined to reschedule stating she really needs to see if cancer shows up).   She states the hospital wants to send her to rehab, she prefers recovering at home. The MD told her she would have to reach out to Dr.Tucker to initiate palliative care for home.

## 2023-01-19 NOTE — Plan of Care (Signed)
  Problem: Education: Goal: Knowledge of the prescribed therapeutic regimen will improve Outcome: Progressing Goal: Understanding of discharge needs will improve Outcome: Progressing Goal: Individualized Educational Video(s) Outcome: Progressing   Problem: Activity: Goal: Ability to avoid complications of mobility impairment will improve Outcome: Progressing Goal: Ability to tolerate increased activity will improve Outcome: Progressing   Problem: Clinical Measurements: Goal: Postoperative complications will be avoided or minimized Outcome: Progressing   Problem: Skin Integrity: Goal: Will show signs of wound healing Outcome: Progressing   Problem: Education: Goal: Knowledge of General Education information will improve Description: Including pain rating scale, medication(s)/side effects and non-pharmacologic comfort measures Outcome: Progressing   Problem: Clinical Measurements: Goal: Ability to maintain clinical measurements within normal limits will improve Outcome: Progressing Goal: Will remain free from infection Outcome: Progressing Goal: Diagnostic test results will improve Outcome: Progressing Goal: Respiratory complications will improve Outcome: Progressing Goal: Cardiovascular complication will be avoided Outcome: Progressing   Problem: Activity: Goal: Risk for activity intolerance will decrease Outcome: Progressing   Problem: Nutrition: Goal: Adequate nutrition will be maintained Outcome: Progressing   Problem: Coping: Goal: Level of anxiety will decrease Outcome: Progressing   Problem: Pain Managment: Goal: General experience of comfort will improve Outcome: Progressing   Problem: Safety: Goal: Ability to remain free from injury will improve Outcome: Progressing   Problem: Skin Integrity: Goal: Risk for impaired skin integrity will decrease Outcome: Progressing

## 2023-01-19 NOTE — Progress Notes (Signed)
PROGRESS NOTE    HERMIE HOK  WUJ:[id number] DOB: April 30, 1951 DOA: 01/18/2023 PCP: Tresa Garter, MD   Brief Narrative:  [name] is a pleasant 72 y.o. female with medical history significant for hypertension, depression, anxiety, hypothyroidism, CKD 3B, and osteoarthritis status post right total hip arthroplasty on 01/13/2023 who presents to the emergency department with right hip pain after syncopal episode.  Patient found to have markedly elevated hypertension with exertion with ongoing symptoms of vertigo dizziness lightheadedness and near syncope.  Assessment & Plan:   Principal Problem:   Symptomatic anemia Active Problems:   Fibromyalgia   Severe episode of recurrent major depressive disorder, without psychotic features (HCC)   Syncope   Hypothyroidism   HTN (hypertension)   GAD (generalized anxiety disorder)   CKD stage 3b, GFR 30-44 ml/min (HCC)   Symptomatic anemia, resolved - No overt bleeding, likely related to recent surgery   - 2 units RBC at intake, hemoglobin improving, 9.9   Syncope   -Orthostatics negative, noted elevation in blood pressure with standing, symptoms ongoing -Hemoglobin improved status post transfusion, continue home hypertensive medications as below   Hypertension  - Continue losartan, clonidine, Coreg     Depression, anxiety  - Continue Prozac, Remeron, Buspar, and as-needed Xanax     CKD 3B  - Appears close to baseline  - Renally-dose medications, monitor    Hypothyroidism  - Continue Synthroid   Bilateral knee pain  -In the setting of recent fall and trauma, plain films negative bilaterally for acute fracture check x-rays    Goals of care -Per discussion with other providers patient concerned she has recurrence of her prior cancer and has requested a palliative care/hospice consult which we discussed was not necessary in the inpatient setting given she has no formal diagnosis and no clear indication to  transition into hospice/palliative care. -Interestingly the patient remains full code, prior discussion with chaplain apparently is the initial indication for discussion in regards to palliative care -CT chest abdomen pelvis with contrast planned on 9/6, given discussion with prior physicians will order CT while inpatient for staging purposes, certainly if patient has recurrence or metastatic disease a palliative consult would be reasonable.  DVT prophylaxis: SCDs Start: 01/18/23 1753 Code Status:   Code Status: Full Code  Family Communication: None present  Status is: Inpatient  Dispo: The patient is from: Home              Anticipated d/c is to: To be determined              Anticipated d/c date is: 24 to 48 hours              Patient currently is medically stable for discharge  Consultants:  None  Procedures:  None  Antimicrobials:  None  Subjective: No acute issues or events overnight, continues to complain of ambulatory dysfunction, instability and concern for falls.  Denies nausea vomiting diarrhea constipation headache fevers chills chest pain shortness of breath.  Objective: Vitals:   01/19/23 0229 01/19/23 0252 01/19/23 0520 01/19/23 0557  BP: 137/74 134/74 (!) 140/75   Pulse: 61 60 64   Resp: 18 15 16    Temp: 98.3 F (36.8 C) 98.4 F (36.9 C) 98.6 F (37 C)   TempSrc: Oral Oral    SpO2: 95% 95% 96%   Weight:    79.8 kg  Height:        Intake/Output Summary (Last 24 hours) at 01/19/2023 0745 Last data filed  at 01/19/2023 0558 Gross per 24 hour  Intake 906 ml  Output 1400 ml  Net -494 ml   Filed Weights   01/18/23 1831 01/19/23 0557  Weight: 79.5 kg 79.8 kg    Examination:  General:  Pleasantly resting in bed, No acute distress. HEENT:  Normocephalic atraumatic.  Sclerae nonicteric, noninjected.  Extraocular movements intact bilaterally. Neck:  Without mass or deformity.  Trachea is midline. Lungs:  Clear to auscultate bilaterally without rhonchi,  wheeze, or rales. Heart:  Regular rate and rhythm.  Without murmurs, rubs, or gallops. Abdomen:  Soft, nontender, nondistended.  Without guarding or rebound. Extremities: Without cyanosis, clubbing, edema, or obvious deformity.  Hip bandage, right, clean dry intact. Skin:  Warm and dry, no erythema.  Data Reviewed: I have personally reviewed following labs and imaging studies  CBC: Recent Labs  Lab 01/14/23 0343 01/18/23 1245 01/19/23 0533  WBC 11.1* 6.9 6.8  NEUTROABS  --  4.9  --   HGB 8.6* 7.1* 9.9*  HCT 25.8* 22.1* 30.3*  MCV 95.6 97.8 95.3  PLT 213 327 259   Basic Metabolic Panel: Recent Labs  Lab 01/14/23 0343 01/14/23 1402 01/18/23 1245 01/19/23 0533  NA 134* 133* 139 136  K 5.1 4.4 4.4 4.0  CL 106 105 108 104  CO2 22 20* 22 24  GLUCOSE 118* 117* 91 113*  BUN 23 24* 20 21  CREATININE 1.47* 1.41* 1.34* 1.12*  CALCIUM 8.0* 7.7* 8.0* 8.2*  MG  --   --  1.5* 1.9   GFR: Estimated Creatinine Clearance: 42.8 mL/min (A) (by C-G formula based on SCr of 1.12 mg/dL (H)). Liver Function Tests: Recent Labs  Lab 01/18/23 1245  AST 36  ALT 13  ALKPHOS 83  BILITOT 0.6  PROT 5.9*  ALBUMIN 2.7*   No results found for this or any previous visit (from the past 240 hour(s)).   Radiology Studies: DG Knee Left Port  Result Date: 01/18/2023 CLINICAL DATA:  Acute pain of left knee. EXAM: PORTABLE LEFT KNEE - 1-2 VIEW COMPARISON:  None Available. FINDINGS: No fracture or dislocation. There is mild peripheral spurring most prominent in the patellofemoral compartment. Faint chondrocalcinosis. Minimal knee joint effusion. No erosive change. Mild prepatellar soft tissue edema. IMPRESSION: 1. Mild osteoarthritis and chondrocalcinosis. Minimal knee joint effusion. 2. Mild prepatellar soft tissue edema. Electronically Signed   By: Narda Rutherford M.D.   On: 01/18/2023 23:13   CT PELVIS WO CONTRAST  Result Date: 01/18/2023 CLINICAL DATA:  Pain after trauma. EXAM: CT PELVIS WITHOUT  CONTRAST TECHNIQUE: Multidetector CT imaging of the pelvis was performed following the standard protocol without intravenous contrast. RADIATION DOSE REDUCTION: This exam was performed according to the departmental dose-optimization program which includes automated exposure control, adjustment of the mA and/or kV according to patient size and/or use of iterative reconstruction technique. COMPARISON:  X-ray 01/13/23. FINDINGS: Urinary Tract: Preserved contours of the urinary bladder. Bladder is distended. Bowel: On this non oral contrast exam the large bowel included in the imaging field of the pelvis has diffuse colonic stool. Small bowel is nondilated. Vascular/Lymphatic: Slight vascular calcifications identified along the iliac vessels. No specific abnormal lymph node enlargement identified. Reproductive:  Uterus is absent. Other:  Trace free fluid in the right hemipelvis. Musculoskeletal: There is streak artifact related to the patient's right hip arthroplasty. Press-Fit components. No hardware failure. Surgery was recent. There is skin thickening along the anterior right thigh with subcutaneous fluid and stranding as well as some bubbles of air. Is  also fluid tracking along the fascial planes of the anterior thigh musculature. No obvious hardware failure. No secondary fracture. Degenerative changes seen of the pelvis including the sacroiliac joints, hip joint on the left and pubic symphysis. In particular left hip with moderate concentric joint space loss. Chondrocalcinosis of the pubic symphesis IMPRESSION: Surgical changes of right hip hemiarthroplasty with maturing postsurgical changes with fluid, air. Associated streak artifact but no obvious hardware failure. No fracture or dislocation. Electronically Signed   By: Karen Kays M.D.   On: 01/18/2023 17:25   DG Knee 1-2 Views Right  Result Date: 01/18/2023 CLINICAL DATA:  Pain after fall. EXAM: RIGHT KNEE - 1-2 VIEW COMPARISON:  None Available. FINDINGS: No  fracture or dislocation. Minor degenerative spurring. No significant knee joint effusion. Generalized soft tissue edema. IMPRESSION: Generalized soft tissue edema. No fracture or dislocation. Electronically Signed   By: Narda Rutherford M.D.   On: 01/18/2023 16:01   DG Hip Unilat With Pelvis 2-3 Views Right  Result Date: 01/18/2023 CLINICAL DATA:  fall, recent replacement.  Right hip pain. EXAM: DG HIP (WITH OR WITHOUT PELVIS) 2-3V RIGHT COMPARISON:  09/09/2022. FINDINGS: Pelvis is intact with normal and symmetric sacroiliac joints. No acute fracture or dislocation. No aggressive osseous lesion. Visualized sacral arcuate lines are unremarkable. Unremarkable symphysis pubis. There are mild degenerative changes of the left hip joint with mild-to-moderate joint space narrowing and osteophytosis of the superior acetabulum. Patient is status post recent right hip arthroplasty. No periprosthetic fracture or lucency. The hardware appears in near anatomic alignment. No radiopaque foreign bodies. IMPRESSION: *No acute fracture or dislocation. Status post right hip arthroplasty. Electronically Signed   By: Jules Schick M.D.   On: 01/18/2023 13:31    Scheduled Meds:  aspirin  81 mg Oral BID WC   busPIRone  30 mg Oral BID   carvedilol  12.5 mg Oral BID WC   cloNIDine  0.2 mg Oral QHS   FLUoxetine  40 mg Oral q morning   levothyroxine  100 mcg Oral QAC breakfast   losartan  50 mg Oral Daily   mirtazapine  30 mg Oral QHS   pantoprazole  40 mg Oral Daily   rosuvastatin  10 mg Oral QHS   sodium chloride flush  3 mL Intravenous Q12H   Continuous Infusions:   LOS: 0 days   Time spent:  Azucena Fallen, DO [facility]  If 7PM-7AM, please contact night-coverage [email or web address]  01/19/2023, 7:45 AM

## 2023-01-19 NOTE — Progress Notes (Signed)
[name] Time: 4:10p-5:00 p50 minutes   [name] is a 72 y.o. 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 (Status: maintained) -- No Description Entered  Diminished interest in or enjoyment of activities. (Status: maintained) -- No Description Entered  Lack of energy. (Status: maintained) -- No Description Entered  Sad affect, social withdrawal, anxiety, loss of interest in activities, and low energy. (Status: maintained) -- No Description Entered  Medication Status compliance  Safety unspecified If Suicidal or Homicidal State Action Taken: unspecified  Current Risk:  Medications Buspar (Dosage: 30mg  2X/day)  New Medication (Dosage: 250mg  2x/day)  New Medication (Dosage: 250mg  2x/day)  Prozac (Dosage: 40mg  daily)  Remeron (Dosage: 30mg  nightly)  xanax (Dosage: .5mg  2-3X/day)  Objectives Related Problem: Recognize, accept, and cope with feelings of depression. Description: Identify and replace thoughts and beliefs that support depression. Target Date: 2023-04-29 Frequency: Daily Modality: individual Progress: 75%  Related Problem: Recognize, accept, and cope with feelings of depression. Description: Verbalize an understanding of healthy and unhealthy emotions with the intent of increasing the use of healthy emotions to guide actions. Target Date: 2023-04-29 Frequency: Daily Modality: individual Progress: 85%  Related Problem: Recognize, accept, and cope with feelings of depression. Description: Increasingly verbalize hopeful and positive statements regarding self, others, and the future. Target Date: 2022-04-28 Frequency: Daily Modality:  individual Progress: 100%  Related Problem: Reduce fear, anxiety, and worry associated with the medical condition. Description: Share with significant others efforts to adapt successfully to the medical condition/diagnosis. Target Date: 2023-04-29 Frequency: Daily Modality: individual Progress: 85%  Related Problem: Reduce fear, anxiety, and worry associated with the medical condition. Description: Engage in social, productive, and recreational activities that are possible in spite of medical condition. Target Date: 2023-04-29 Frequency: Daily Modality: individual Progress: 90%  Related Problem: Reduce fear, anxiety, and worry associated with the medical condition. Description: Identify the coping skills and sources of emotional support that have been beneficial in the past. Target Date: 2023-04-29 Frequency: Daily Modality: individual Progress: 80%  Client Response full compliance  Service Location Location, [address] [place], [place] [address]  Service Code cpt [phone]  Related past to present  Facilitate problem solving  Identify/label emotions  Validate/empathize  Self care activities  Lifestyle change (exercise, nutrition)  Assess/facilitate readiness to change  Relaxation training  Rationally challenge thoughts or beliefs/cognitive restructuring  Comments  Dx: F43.21  Meds: Prozac, [name], patient is struggling to rebuild her life and adjust to her new circumstances. She suffers a negative self-concept and has little confidence in her judgement. She is seeking to reduce symptoms of anxiety and self-doubt and to re-define satisfa ction moving forward. Therapy will focus on cognitive restructuring to redefine her experiences and focus on those satisfying aspects of her life. Will target this goal completion for April1, 2021 (completed). She is also wanting to utilize counseling to adjust and accept  her  latest diagnosis of cancer. She wants assistance to better manage this news and remain positive throughout treatment. Goal date is 12-22. Patient has made progress, but her medical condition is variable and she requests additional treatment to manage moods throughout her medical ordeal. In addition, she is trying to manage feelings associated with her older son's mental health struggles. Revised goal date is 12-24.  Patient agrees to an audio session and is aware of the limitations of this platform. She is in the hospital and I am in office.   [name] had her hip replacement. In hospital 1 day before going to aunt and uncle's house. She had a fall which they think is related to hemoglobin. She was sent to ER and was hospitalized after 8 hours. She is in reasonably good spirits givenall she has been through medically. She does have some concern that she may have more cancer, but she is having a scan today along with blood work. We talked about support (physical and emotional) she will need at home upon discharge.                                                                                                                                                                                                      [name] Time: 3:15p-4:00p 45 minutes

## 2023-01-19 NOTE — [facility] Notes (Signed)
Chaplain attempted to follow up with [name], but she was preparing for a CT.  Chaplain will follow up tomorrow.  [address], [facility] Pager, [phone]

## 2023-01-20 ENCOUNTER — Ambulatory Visit: Payer: 59 | Admitting: Internal Medicine

## 2023-01-20 DIAGNOSIS — D649 Anemia, unspecified: Secondary | ICD-10-CM | POA: Diagnosis not present

## 2023-01-20 LAB — BASIC METABOLIC PANEL
Anion gap: 9 (ref 5–15)
BUN: 15 mg/dL (ref 8–23)
CO2: 25 mmol/L (ref 22–32)
Calcium: 8.7 mg/dL — ABNORMAL LOW (ref 8.9–10.3)
Chloride: 103 mmol/L (ref 98–111)
Creatinine, Ser: 1.1 mg/dL — ABNORMAL HIGH (ref 0.44–1.00)
GFR, Estimated: 53 mL/min — ABNORMAL LOW (ref >60)
Glucose, Bld: 96 mg/dL (ref 70–99)
Potassium: 4.6 mmol/L (ref 3.5–5.1)
Sodium: 137 mmol/L (ref 135–145)

## 2023-01-20 LAB — TYPE AND SCREEN
ABO/RH(D): O POS
Antibody Screen: NEGATIVE
Unit division: 0
Unit division: 0

## 2023-01-20 LAB — BPAM RBC
Blood Product Expiration Date: 202410012359
Blood Product Expiration Date: 202410012359
ISSUE DATE / TIME: 202409032111
ISSUE DATE / TIME: 202409040011
Unit Type and Rh: 5100
Unit Type and Rh: 5100

## 2023-01-20 LAB — CBC
HCT: 30.3 % — ABNORMAL LOW (ref 36.0–46.0)
Hemoglobin: 10.1 g/dL — ABNORMAL LOW (ref 12.0–15.0)
MCH: 31.4 pg (ref 26.0–34.0)
MCHC: 33.3 g/dL (ref 30.0–36.0)
MCV: 94.1 fL (ref 80.0–100.0)
Platelets: 289 10*3/uL (ref 150–400)
RBC: 3.22 MIL/uL — ABNORMAL LOW (ref 3.87–5.11)
RDW: 13.6 % (ref 11.5–15.5)
WBC: 9 10*3/uL (ref 4.0–10.5)
nRBC: 0.2 % (ref 0.0–0.2)

## 2023-01-20 NOTE — TOC Initial Note (Addendum)
Transition of Care [name]) - Initial/Assessment Note    Patient Details  Name: [name] MRN: [id number] Date of Birth: 1950-06-14  Transition of Care [facility]) CM/SW Contact:    Darleene Cleaver, LCSW Phone Number: 01/20/2023, 11:47 AM  Clinical Narrative:                  Patient is from home, and is currently receiving [facility] PT and RN from [facility].  Per patient she was staying at her aunt and uncles for a few days since her surgery.  Patient has a son who is a Engineer, civil (consulting) at [employer].  Patient states she has a walker and wheelchair, she uses her insurance to provide transportation for appointments and will use them to return back home.  Patient was very talkative and positive.  Patient stated that she is feeling much better since she has been at the hospital, and feels comfortable going back home with [facility] services.  CSW spoke to [place] at [facility] and they can continue to see her for [facility].  Patient will be dishcharging back to her home address, she does have a wheelchair ramp to get home, and her medications are through [facility] outpatient pharmacy and are through mail order.  Patient states she has not received her mail order medications yet, CSW advised her to contact the pharmacy to find out what the delay is.  Patient stated she will call them.  TOC discussed patient being on observation she stated she understood and did not have any other questions.  Patient has been set up with [facility] PT and RN through [place].  Expected Discharge Plan: Home w Home Health Services Barriers to Discharge: Barriers Resolved   Patient Goals and CMS Choice Patient states their goals for this hospitalization and ongoing recovery are:: To return back home with home health PT and RN. CMS Medicare.gov Compare Post Acute Care list provided to:: Patient Choice offered to / list presented to : Patient      Expected Discharge Plan and Services     Post Acute Care Choice: Home Health Living arrangements for the past 2  months: Single Family Home                           HH Arranged: PT, RN HH Agency: [facility] Date [facility] Agency Contacted: 01/20/23 Time HH Agency Contacted: 1141 Representative spoke with at [facility] Agency: Cindie  Prior Living Arrangements/Services Living arrangements for the past 2 months: Single Family Home Lives with:: Self Patient language and need for interpreter reviewed:: Yes Do you feel safe going back to the place where you live?: Yes      Need for Family Participation in Patient Care: No (Comment) Care giver support system in place?: No (comment)   Criminal Activity/Legal Involvement Pertinent to Current Situation/Hospitalization: No - Comment as needed  Activities of Daily Living Home Assistive Devices/Equipment: Dan Humphreys (specify type) ADL Screening (condition at time of admission) Patient's cognitive ability adequate to safely complete daily activities?: Yes Is the patient deaf or have difficulty hearing?: No Does the patient have difficulty seeing, even when wearing glasses/contacts?: No Does the patient have difficulty concentrating, remembering, or making decisions?: Yes Patient able to express need for assistance with ADLs?: Yes Does the patient have difficulty dressing or bathing?: Yes Independently performs ADLs?: No Communication: Independent Dressing (OT): Needs assistance Is this a change from baseline?: Change from baseline, expected to last >3 days Grooming: Needs assistance  Is this a change from baseline?: Change from baseline, expected to last >3 days Feeding: Independent Bathing: Needs assistance Is this a change from baseline?: Change from baseline, expected to last >3 days Toileting: Needs assistance Is this a change from baseline?: Change from baseline, expected to last >3days In/Out Bed: Needs assistance Is this a change from baseline?: Change from baseline, expected to last >3 days Walks in Home: Needs assistance Is this a change  from baseline?: Change from baseline, expected to last >3 days Does the patient have difficulty walking or climbing stairs?: Yes Weakness of Legs: Both Weakness of Arms/Hands: None  Permission Sought/Granted Permission sought to share information with : Case Manager, Family Supports Permission granted to share information with : Yes, Release of Information Signed, Yes, Verbal Permission Granted  Share Information with NAMEFrancine Graven [phone]  [phone]  Cleotis Lema [phone]  [phone]  Permission granted to share info w AGENCY: Home Health Agencies        Emotional Assessment Appearance:: Appears stated age Attitude/Demeanor/Rapport: Engaged Affect (typically observed): Accepting Orientation: : Oriented to Self, Oriented to Place, Oriented to  Time, Oriented to Situation Alcohol / Substance Use: Not Applicable Psych Involvement: No (comment)  Admission diagnosis:  Syncope and collapse [R55] Symptomatic anemia [D64.9] Pain of right hip [M25.551] Patient Active Problem List   Diagnosis Date Noted   Symptomatic anemia 01/18/2023   Osteoarthritis of right hip 01/13/2023   S/P total right hip arthroplasty 01/13/2023   H/O parathyroidectomy 12/22/2022   Hyponatremia 11/20/2022   Hip pain, chronic, right 09/09/2022   Ataxia 08/16/2022   Falls frequently 08/16/2022   Flank pain 06/14/2022   History of hyperparathyroidism 05/21/2022   Diaphoresis 05/21/2022   Cystitis 05/21/2022   Hyperglycemia 03/01/2022   Bilateral pseudophakia 02/04/2022   SK (seborrheic keratosis) 12/15/2021   Parathyroid adenoma 10/08/2021   Hypercalcemia 07/29/2021   History of COVID-19 07/06/2021   Adjustment disorder with mixed anxiety and depressed mood 06/24/2021   Atherosclerosis of aorta (HCC) 12/11/2020   Low BP 09/10/2020   Physical deconditioning 09/10/2020   Osteopenia 03/24/2020   Genital herpes simplex 03/24/2020   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    Bipolar I disorder (HCC) 03/24/2020   Wears glasses    Urgency of urination    Tuberculosis    Status post chemotherapy    S/P dilatation of esophageal stricture    Renal calculus, bilateral    Positive reaction to tuberculin skin test    Pneumonia    Mild persistent asthma    IDA (iron deficiency anemia)    Hx of transfusion of packed red blood cells    History of syncope    History of positive PPD    History of kidney stones    History of DVT (deep vein thrombosis)    History of concussion    GAD (generalized anxiety disorder)    CKD stage 3b, GFR 30-44 ml/min (HCC)    Chronically dry eyes    Chronic cystitis    Chronic bronchitis (HCC)    Arthritis    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   Pelvic mass in female 09/20/2019   Ureteral stone with hydronephrosis 09/04/2019   Edema 08/27/2019   Bladder pain 08/2019   Dysuria 05/17/2019   Suspected 2019 novel coronavirus infection 04/25/2019   Urinary urgency 03/06/2019   Diarrhea 03/06/2019   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   Dyspnea on exertion 08/25/2017   Chest pain in adult 07/13/2017   HTN (hypertension) 05/31/2017   Anemia  05/31/2017   Leg pain, anterior, left 12/15/2016   Paresthesia 12/15/2016   Syncope 10/13/2016   Migraine headache 10/13/2016   Asthma 10/13/2016   Anxiety 10/13/2016   Hyperlipidemia 10/13/2016   UTI (urinary tract infection) 10/13/2016   Hypothyroidism 10/13/2016   Laryngopharyngeal reflux (LPR) 06/15/2016   Acute upper respiratory infection 05/05/2016   Degenerative disc disease, cervical 11/29/2015   IBS (irritable bowel syndrome) 11/24/2015   Facet arthritis of cervical region 10/16/2015   Chronic urinary tract infection 07/22/2015   Malaise and fatigue 04/16/2015   Severe episode of recurrent major depressive disorder, without psychotic features (HCC) 12/18/2014   Post concussion syndrome 10/28/2014   Brain syndrome, 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   Major depressive disorder,  recurrent, severe without psychotic features (HCC)    Cervical nerve root disorder 09/18/2014   Concussion w/o coma 09/18/2014   Irritable bowel syndrome with diarrhea 09/12/2014   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   Cold intolerance 03/26/2014   Cough variant asthma  vs UACS/ irritable larynx  03/26/2014   Abdominal pain 03/26/2014   Positive skin test 03/26/2014   Adjustment disorder with anxiety 02/07/2014   Cervical pain 12/05/2013   Fibromyalgia 12/05/2013   Cephalalgia 12/05/2013   Adult hypothyroidism 12/05/2013   Adaptive colitis 12/05/2013   Menopause 12/05/2013   Mild cognitive disorder 12/05/2013   Feeling bilious 12/05/2013   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   Lung mass 09/10/2013   Lung nodule, solitary 09/10/2013   Abnormal chest x-ray    Vitamin D deficiency 04/25/2013   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 [place] [address] Phone: [phone] Fax: [phone]     Social Determinants of Health (SDOH) Social History: SDOH Screenings   Food Insecurity: No Food Insecurity (01/18/2023)  Recent Concern: Food Insecurity - Food Insecurity Present (11/24/2022)  Housing: Low Risk  (01/18/2023)  Transportation Needs: No Transportation Needs (01/18/2023)  Recent Concern: Transportation Needs - Unmet Transportation Needs (11/24/2022)  Utilities: Not At Risk (01/18/2023)  Alcohol Screen: Low Risk  (12/15/2021)  Depression  (PHQ2-9): High Risk (09/09/2022)  Financial Resource Strain: Low Risk  (12/15/2021)  Physical Activity: Sufficiently Active (12/15/2021)  Social Connections: Moderately Integrated (12/15/2021)  Stress: No Stress Concern Present (12/15/2021)  Tobacco Use: Low Risk  (01/18/2023)   SDOH Interventions:     Readmission Risk Interventions     No data to display

## 2023-01-20 NOTE — Progress Notes (Signed)
[name] requested a follow up visit from chaplain to help support her in her health journey. When chaplain arrived, she was waiting to discharge.  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  She shared about grief and parenting as well as about the relationships in her family. She was grateful for all of the support she has received while here in the hospital.  [facility], Bcc Pager, [phone]

## 2023-01-20 NOTE — TOC Transition Note (Addendum)
Transition of Care [facility]) - CM/SW Discharge Note   Patient Details  Name: [name] MRN: [id number] Date of Birth: Oct 04, 1950  Transition of Care [facility]) CM/SW Contact:  Darleene Cleaver, LCSW Phone Number: 01/20/2023, 11:58 AM   Clinical Narrative:     CSW spoke to patient, she is returning back home with [facility] PT and RN.  CSW confirmed with Cindie at [facility] that she is active with them.  CSW updated bedside nurse and attending physician.    Patient will be going home with home health through [place].  Patient will have transportation arranged through her insurance company.  Patient has a walker, and wheelchair, she does not need any other equipment.  Patient has a ramp to get into her home.      TOC signing off please reconsult with any other TOC needs, home health agency has been notified of planned discharge.     Final next level of care: Home w Home Health Services Barriers to Discharge: Barriers Resolved   Patient Goals and CMS Choice CMS Medicare.gov Compare Post Acute Care list provided to:: Patient Choice offered to / list presented to : Patient  Discharge Placement    Home with home health PT and RN.              Patient to be transferred to facility by: [facility] medicare transportation Name of family member notified: Patient to notify her family members Patient and family notified of of transfer: 01/20/23  Discharge Plan and Services Additional resources added to the After Visit Summary for       Post Acute Care Choice: Home Health                    HH Arranged: RN, PT [facility] Agency: [facility] Date [facility] Agency Contacted: 01/20/23 Time HH Agency Contacted: 1157 Representative spoke with at [facility] Agency: Cindie  Social Determinants of Health (SDOH) Interventions SDOH Screenings   Food Insecurity: No Food Insecurity (01/18/2023)  Recent Concern: Food Insecurity - Food Insecurity Present (11/24/2022)  Housing: Low Risk  (01/18/2023)  Transportation  Needs: No Transportation Needs (01/18/2023)  Recent Concern: Transportation Needs - Unmet Transportation Needs (11/24/2022)  Utilities: Not At Risk (01/18/2023)  Alcohol Screen: Low Risk  (12/15/2021)  Depression (PHQ2-9): High Risk (09/09/2022)  Financial Resource Strain: Low Risk  (12/15/2021)  Physical Activity: Sufficiently Active (12/15/2021)  Social Connections: Moderately Integrated (12/15/2021)  Stress: No Stress Concern Present (12/15/2021)  Tobacco Use: Low Risk  (01/18/2023)     Readmission Risk Interventions     No data to display

## 2023-01-20 NOTE — Discharge Summary (Signed)
Physician Discharge Summary  [name]:[id number] DOB: 17-Jun-1950 DOA: 01/18/2023  PCP: Tresa Garter, MD  Admit date: 01/18/2023 Discharge date: 01/20/2023  Admitted From: Home Disposition: Home  Recommendations for Outpatient Follow-up:  Follow up with PCP in 1-2 weeks Follow-up with oncology as scheduled  Home Health: Resume PT nursing Equipment/Devices: No new equipment  Discharge Condition: Stable CODE STATUS: Full Diet recommendation: As tolerated  Brief/Interim Summary: [name] is a pleasant 72 y.o. female with medical history significant for hypertension, depression, anxiety, hypothyroidism, CKD 3B, and osteoarthritis status post right total hip arthroplasty on 01/13/2023 who presents to the emergency department with right hip pain after syncopal episode.  Patient treated as above with 2 unit RBC transfusion hemoglobin now within normal limits stable if not rising appropriately.  Patient syncope workup was otherwise unremarkable other than notable orthostatic blood pressure medications increasing with changing position rather than decreasing as expected.  Regardless her symptoms are now improved she has no back to her own baseline per her own report and otherwise stable and agreeable for discharge home.  Continue home health PT and nursing as scheduled.  While patient was hospitalized lengthy discussion with patient's oncologist, CT chest abdomen pelvis was done per request with no obvious metastatic disease which was patient's main concern.  Otherwise follow-up outpatient for ongoing routine care.  No medication changes, see below.  Discharge Diagnoses:  Principal Problem:   Symptomatic anemia Active Problems:   Fibromyalgia   Severe episode of recurrent major depressive disorder, without psychotic features (HCC)   Syncope   Hypothyroidism   HTN (hypertension)   GAD (generalized anxiety disorder)   CKD stage 3b, GFR 30-44 ml/min  (HCC)    Discharge Instructions   Allergies as of 01/20/2023       Reactions   Morphine And Codeine Other (See Comments)   Severe headache   Amlodipine Swelling   Buprenorphine Other (See Comments)   Confusion, headache   Cephalexin Nausea And Vomiting   Other Other (See Comments)   Trees & Grass = allergic symptoms   Sulfamethoxazole-trimethoprim Nausea And Vomiting, Other (See Comments)   Dizziness    Sumatriptan Other (See Comments)   Ineffective    Codeine Other (See Comments)   Disorientation   Dust Mite Extract Cough   Molds & Smuts Cough   Tape Rash   Pulls off skin        Medication List     TAKE these medications    Acidophilus Caps capsule Take 1 capsule by mouth daily.   ALPRAZolam 1 MG tablet Commonly known as: XANAX TAKE 1 TABLET BY MOUTH 3 TIMES DAILY AS NEEDED FOR ANXIETY.   ascorbic acid 500 MG tablet Commonly known as: VITAMIN C Take 1 tablet (500 mg total) by mouth daily.   Aspirin Low Dose 81 MG chewable tablet Generic drug: aspirin Chew 1 tablet (81 mg total) by mouth 2 (two) times daily with a meal.   busPIRone 30 MG tablet Commonly known as: BUSPAR Take 1 tablet (30 mg total) by mouth 2 (two) times daily.   carvedilol 12.5 MG tablet Commonly known as: COREG Take 1 tablet (12.5 mg total) by mouth 2 (two) times daily with a meal.   cloNIDine 0.2 MG tablet Commonly known as: Catapres Take 1 tablet (0.2 mg total) by mouth at bedtime.   colesevelam 625 MG tablet Commonly known as: WELCHOL Take 2 tablets (1,250 mg total) by mouth 2 (two) times daily with a meal.  cyanocobalamin 1000 MCG tablet Commonly known as: VITAMIN B12 Take 1 tablet (1,000 mcg total) by mouth daily. Take 1,000 mcg by mouth daily.   D-Mannose 500 MG Caps Take 500 mg by mouth 2 (two) times daily. WITH CRANBERRY & DANDELION EXTRACT   D3 5000 125 MCG (5000 UT) capsule Generic drug: Cholecalciferol Take 1 capsule (5,000 Units total) by mouth daily.    docusate sodium 100 MG capsule Commonly known as: Colace Take 1 capsule (100 mg total) by mouth 2 (two) times daily.   esomeprazole 40 MG capsule Commonly known as: NEXIUM Take 1 capsule (40 mg total) by mouth every morning.   FLUoxetine 40 MG capsule Commonly known as: PROZAC Take 1 capsule (40 mg total) by mouth every morning.   hydrALAZINE 10 MG tablet Commonly known as: APRESOLINE Take 1 tablet (10 mg total) by mouth 4 (four) times daily as needed.   hydrochlorothiazide 25 MG tablet Commonly known as: HYDRODIURIL Take 1 tablet by mouth daily.   HYDROcodone-acetaminophen 5-325 MG tablet Commonly known as: NORCO/VICODIN Take 1 tablet by mouth every 4 (four) hours as needed for up to 7 days for moderate pain or severe pain.   ipratropium-albuterol 0.5-2.5 (3) MG/3ML Soln Commonly known as: DUONEB Take 3 mLs by nebulization every 6 (six) hours as needed (Asthma).   iron polysaccharides 150 MG capsule Commonly known as: Poly-Iron 150 Take 1 capsule (150 mg total) by mouth daily.   levothyroxine 100 MCG tablet Commonly known as: SYNTHROID Take 1 tablet (100 mcg total) by mouth daily before breakfast.   losartan 50 MG tablet Commonly known as: COZAAR Take 1 tablet (50 mg total) by mouth daily.   Metamucil Fiber Chew Chew 2 tablets by mouth in the morning and at bedtime.   mirtazapine 30 MG tablet Commonly known as: REMERON Take 1 tablet (30 mg total) by mouth at bedtime.   omega-3 acid ethyl esters 1 g capsule Commonly known as: LOVAZA Take 2 capsules (2 g total) by mouth 2 (two) times daily.   ondansetron 4 MG tablet Commonly known as: Zofran Take 1 tablet (4 mg total) by mouth every 8 (eight) hours as needed for nausea or vomiting.   OVER THE COUNTER MEDICATION Take 1 capsule by mouth daily. 11 MUSHROOM BLEND   rosuvastatin 10 MG tablet Commonly known as: CRESTOR Take 1 tablet (10 mg total) by mouth daily.   senna 8.6 MG Tabs tablet Commonly known as:  SENOKOT Take 2 tablets (17.2 mg total) by mouth at bedtime for 15 days.   sodium chloride 0.65 % Soln nasal spray Commonly known as: OCEAN Place 1 spray into both nostrils as needed for congestion.   Systane 0.4-0.3 % Soln Generic drug: Polyethyl Glycol-Propyl Glycol Place 1 drop into both eyes 3 (three) times daily as needed (dry eyes). Place 1 drop into both eyes 3 (three) times daily as needed (dry eyes).        Follow-up Information     [facility] Follow up.   Specialty: Home Health Services Why: Frances Furbish will contact you to set up the home health physical therapy appointment and nurse. Contact information: [address] [place] [address] [phone]                Allergies  Allergen Reactions   Morphine And Codeine Other (See Comments)    Severe headache   Amlodipine Swelling   Buprenorphine Other (See Comments)    Confusion, headache   Cephalexin Nausea And Vomiting  Other Other (See Comments)    Trees & Grass = allergic symptoms   Sulfamethoxazole-Trimethoprim Nausea And Vomiting and Other (See Comments)    Dizziness    Sumatriptan Other (See Comments)    Ineffective    Codeine Other (See Comments)    Disorientation   Dust Mite Extract Cough   Molds & Smuts Cough   Tape Rash    Pulls off skin    Consultations: Oncology  Procedures/Studies: CT CHEST ABDOMEN PELVIS W CONTRAST  Result Date: 01/19/2023 CLINICAL DATA:  Metastatic disease evaluation. Patient with history of uterine/cervical cancer, surveillance. EXAM: CT CHEST, ABDOMEN, AND PELVIS WITH CONTRAST TECHNIQUE: Multidetector CT imaging of the chest, abdomen and pelvis was performed following the standard protocol during bolus administration of intravenous contrast. RADIATION DOSE REDUCTION: This exam was performed according to the departmental dose-optimization program which includes automated exposure control, adjustment of the mA and/or kV according to patient  size and/or use of iterative reconstruction technique. CONTRAST:  OMNIPAQUE IOHEXOL 300 MG/ML  SOLN COMPARISON:  CT chest, abdomen, pelvis FINDINGS: CT CHEST FINDINGS Cardiovascular: The heart is normal in size. No pericardial effusion. Mild aortic atherosclerosis without acute aortic findings. No central pulmonary embolus on this non dedicated exam. Mediastinum/Nodes: No thoracic adenopathy. No suspicious thyroid nodule. No esophageal wall thickening. Lungs/Pleura: Postsurgical change in the right upper lobe with scarring, stable from prior. There are trace bilateral pleural effusions, no significant pleural thickening or enhancement. No pulmonary nodule or focal airspace disease. Musculoskeletal: No focal bone lesion. Lower cervical spine hardware is partially included. No chest wall soft tissue abnormalities. CT ABDOMEN PELVIS FINDINGS Hepatobiliary: No liver lesion. Stable biliary tree post cholecystectomy. Pancreas: No ductal dilatation or inflammation. Spleen: Normal in size without focal abnormality. Adrenals/Urinary Tract: No adrenal nodule. No hydronephrosis. Stable extrarenal pelvis configuration of the right kidney. Bilateral renal parenchymal thinning. Nonobstructing stone in the lower pole of the left kidney. No suspicious renal lesion. Unremarkable urinary bladder. Stomach/Bowel: Unremarkable appearance of the stomach. Small duodenal diverticulum. No bowel obstruction or inflammation. Moderate colonic stool burden. Normal appendix. Vascular/Lymphatic: Mild aortic atherosclerosis. No aneurysm. No enlarged lymph nodes in the abdomen or pelvis. 8 mm short axis left periaortic node is stable from prior, not enlarged. Reproductive: Hysterectomy. No abnormal density in the vaginal cuff. No adnexal mass. Other: Mild presacral fluid likely related to recent right hip surgery. No omental thickening or upper abdominal ascites. Postsurgical change of the anterior lower abdominal wall. Musculoskeletal:  Recent right hip arthroplasty. Moderate left hip osteoarthritis. No evidence of focal bone lesion. Moderate stranding and fluid in the subcutaneous tissues adjacent to the right hip operative bed. Scattered soft tissue gas likely related to recent surgery. Heterogeneous enlargement of the right periarticular musculature. IMPRESSION: 1. No evidence of metastatic disease in the chest, abdomen, or pelvis. 2. Trace bilateral pleural effusions. 3. Recent right hip arthroplasty with stranding and fluid in the subcutaneous tissues adjacent to the right hip operative bed. Heterogeneous enlargement of the right periarticular musculature may be postoperative, however recommend clinical correlation for signs and symptoms of infection. 4. Nonobstructing left renal stone. 5. Moderate colonic stool burden, can be seen with constipation. Aortic Atherosclerosis (ICD10-I70.0). Electronically Signed   By: Narda Rutherford M.D.   On: 01/19/2023 21:08   DG Knee Left Port  Result Date: 01/18/2023 CLINICAL DATA:  Acute pain of left knee. EXAM: PORTABLE LEFT KNEE - 1-2 VIEW COMPARISON:  None Available. FINDINGS: No fracture or dislocation. There is mild peripheral spurring most prominent in  the patellofemoral compartment. Faint chondrocalcinosis. Minimal knee joint effusion. No erosive change. Mild prepatellar soft tissue edema. IMPRESSION: 1. Mild osteoarthritis and chondrocalcinosis. Minimal knee joint effusion. 2. Mild prepatellar soft tissue edema. Electronically Signed   By: Narda Rutherford M.D.   On: 01/18/2023 23:13   CT PELVIS WO CONTRAST  Result Date: 01/18/2023 CLINICAL DATA:  Pain after trauma. EXAM: CT PELVIS WITHOUT CONTRAST TECHNIQUE: Multidetector CT imaging of the pelvis was performed following the standard protocol without intravenous contrast. RADIATION DOSE REDUCTION: This exam was performed according to the departmental dose-optimization program which includes automated exposure control, adjustment of the mA  and/or kV according to patient size and/or use of iterative reconstruction technique. COMPARISON:  X-ray 01/13/23. FINDINGS: Urinary Tract: Preserved contours of the urinary bladder. Bladder is distended. Bowel: On this non oral contrast exam the large bowel included in the imaging field of the pelvis has diffuse colonic stool. Small bowel is nondilated. Vascular/Lymphatic: Slight vascular calcifications identified along the iliac vessels. No specific abnormal lymph node enlargement identified. Reproductive:  Uterus is absent. Other:  Trace free fluid in the right hemipelvis. Musculoskeletal: There is streak artifact related to the patient's right hip arthroplasty. Press-Fit components. No hardware failure. Surgery was recent. There is skin thickening along the anterior right thigh with subcutaneous fluid and stranding as well as some bubbles of air. Is also fluid tracking along the fascial planes of the anterior thigh musculature. No obvious hardware failure. No secondary fracture. Degenerative changes seen of the pelvis including the sacroiliac joints, hip joint on the left and pubic symphysis. In particular left hip with moderate concentric joint space loss. Chondrocalcinosis of the pubic symphesis IMPRESSION: Surgical changes of right hip hemiarthroplasty with maturing postsurgical changes with fluid, air. Associated streak artifact but no obvious hardware failure. No fracture or dislocation. Electronically Signed   By: Karen Kays M.D.   On: 01/18/2023 17:25   DG Knee 1-2 Views Right  Result Date: 01/18/2023 CLINICAL DATA:  Pain after fall. EXAM: RIGHT KNEE - 1-2 VIEW COMPARISON:  None Available. FINDINGS: No fracture or dislocation. Minor degenerative spurring. No significant knee joint effusion. Generalized soft tissue edema. IMPRESSION: Generalized soft tissue edema. No fracture or dislocation. Electronically Signed   By: Narda Rutherford M.D.   On: 01/18/2023 16:01   DG Hip Unilat With Pelvis 2-3 Views  Right  Result Date: 01/18/2023 CLINICAL DATA:  fall, recent replacement.  Right hip pain. EXAM: DG HIP (WITH OR WITHOUT PELVIS) 2-3V RIGHT COMPARISON:  09/09/2022. FINDINGS: Pelvis is intact with normal and symmetric sacroiliac joints. No acute fracture or dislocation. No aggressive osseous lesion. Visualized sacral arcuate lines are unremarkable. Unremarkable symphysis pubis. There are mild degenerative changes of the left hip joint with mild-to-moderate joint space narrowing and osteophytosis of the superior acetabulum. Patient is status post recent right hip arthroplasty. No periprosthetic fracture or lucency. The hardware appears in near anatomic alignment. No radiopaque foreign bodies. IMPRESSION: *No acute fracture or dislocation. Status post right hip arthroplasty. Electronically Signed   By: Jules Schick M.D.   On: 01/18/2023 13:31   DG Pelvis Portable  Result Date: 01/13/2023 CLINICAL DATA:  Status post total right hip arthroplasty anterior approach. EXAM: PORTABLE PELVIS 1-2 VIEWS COMPARISON:  Pelvis and right hip radiographs 09/09/2022 FINDINGS: Interval total right hip arthroplasty. No perihardware lucency is seen to indicate hardware failure or loosening. Moderate left femoroacetabular joint space narrowing and peripheral osteophytosis. Mild bilateral sacroiliac subchondral sclerosis. Surgical clips overlie the mid to right hemipelvis. Expected  postoperative changes including lateral right pelvis and thigh subcutaneous air. No acute fracture or dislocation. IMPRESSION: Interval total right hip arthroplasty without evidence of hardware failure or loosening. Electronically Signed   By: Neita Garnet M.D.   On: 01/13/2023 10:50   DG HIP UNILAT WITH PELVIS 1V RIGHT  Result Date: 01/13/2023 CLINICAL DATA:  Elective surgery.  Anterior right hip replacement. EXAM: DG HIP (WITH OR WITHOUT PELVIS) 1V RIGHT COMPARISON:  Pelvis and right hip radiographs 09/09/2022 FINDINGS: Images were performed  intraoperatively without the presence of a radiologist. New total right hip arthroplasty. No hardware complication is seen. Moderate left femoroacetabular osteoarthritis. Total fluoroscopy images: 2 Total fluoroscopy time: 12 seconds Total dose: Radiation Exposure Index (as provided by the fluoroscopic device): 1.68 mGy air Kerma Please see intraoperative findings for further detail. IMPRESSION: Intraoperative fluoroscopy for total right hip arthroplasty. Electronically Signed   By: Neita Garnet M.D.   On: 01/13/2023 10:49   DG C-Arm 1-60 Min-No Report  Result Date: 01/13/2023 Fluoroscopy was utilized by the requesting physician.  No radiographic interpretation.     Subjective: No acute issues or events overnight denies nausea vomiting diarrhea constipation headache fever chills or chest pain   Discharge Exam: Vitals:   01/20/23 0507 01/20/23 1319  BP: (!) 157/84 (!) 185/96  Pulse: 63 (!) 56  Resp:  18  Temp: 98.3 F (36.8 C) 97.8 F (36.6 C)  SpO2: 95% 100%   Vitals:   01/19/23 0922 01/19/23 2102 01/20/23 0507 01/20/23 1319  BP: (!) 148/75 (!) 169/75 (!) 157/84 (!) 185/96  Pulse: 67 61 63 (!) 56  Resp: 16 16  18   Temp: (!) 97.4 F (36.3 C) 98.2 F (36.8 C) 98.3 F (36.8 C) 97.8 F (36.6 C)  TempSrc:      SpO2: 96% 98% 95% 100%  Weight:      Height:        General: Pt is alert, awake, not in acute distress Cardiovascular: RRR, S1/S2 +, no rubs, no gallops Respiratory: CTA bilaterally, no wheezing, no rhonchi Abdominal: Soft, NT, ND, bowel sounds + Extremities: no edema, no cyanosis    The results of significant diagnostics from this hospitalization (including imaging, microbiology, ancillary and laboratory) are listed below for reference.     Microbiology: No results found for this or any previous visit (from the past 240 hour(s)).   Labs: BNP (last 3 results) No results for input(s): "BNP" in the last 8760 hours. Basic Metabolic Panel: Recent Labs  Lab  01/14/23 0343 01/14/23 1402 01/18/23 1245 01/19/23 0533 01/20/23 0512  NA 134* 133* 139 136 137  K 5.1 4.4 4.4 4.0 4.6  CL 106 105 108 104 103  CO2 22 20* 22 24 25   GLUCOSE 118* 117* 91 113* 96  BUN 23 24* 20 21 15   CREATININE 1.47* 1.41* 1.34* 1.12* 1.10*  CALCIUM 8.0* 7.7* 8.0* 8.2* 8.7*  MG  --   --  1.5* 1.9  --    Liver Function Tests: Recent Labs  Lab 01/18/23 1245  AST 36  ALT 13  ALKPHOS 83  BILITOT 0.6  PROT 5.9*  ALBUMIN 2.7*   CBC: Recent Labs  Lab 01/14/23 0343 01/18/23 1245 01/19/23 0533 01/20/23 0512  WBC 11.1* 6.9 6.8 9.0  NEUTROABS  --  4.9  --   --   HGB 8.6* 7.1* 9.9* 10.1*  HCT 25.8* 22.1* 30.3* 30.3*  MCV 95.6 97.8 95.3 94.1  PLT 213 327 259 289   Urinalysis  Component Value Date/Time   COLORURINE YELLOW 10/22/2022 1654   APPEARANCEUR CLEAR 10/22/2022 1654   LABSPEC 1.005 10/22/2022 1654   PHURINE 5.0 10/22/2022 1654   GLUCOSEU NEGATIVE 10/22/2022 1654   GLUCOSEU NEGATIVE 08/16/2022 1700   HGBUR NEGATIVE 10/22/2022 1654   BILIRUBINUR NEGATIVE 10/22/2022 1654   BILIRUBINUR negative 05/21/2022 1606   KETONESUR NEGATIVE 10/22/2022 1654   PROTEINUR NEGATIVE 10/22/2022 1654   UROBILINOGEN 0.2 08/16/2022 1700   NITRITE NEGATIVE 10/22/2022 1654   LEUKOCYTESUR NEGATIVE 10/22/2022 1654   Sepsis Labs Recent Labs  Lab 01/14/23 0343 01/18/23 1245 01/19/23 0533 01/20/23 0512  WBC 11.1* 6.9 6.8 9.0   Microbiology No results found for this or any previous visit (from the past 240 hour(s)).   Time coordinating discharge: Over 30 minutes  SIGNED:   Azucena Fallen, DO [facility] 01/20/2023, 1:30 PM Pager   If 7PM-7AM, please contact night-coverage [email or web address]

## 2023-01-20 NOTE — Telephone Encounter (Signed)
Called patient and LVM to give us a call back

## 2023-01-21 ENCOUNTER — Other Ambulatory Visit: Payer: 59

## 2023-01-21 ENCOUNTER — Ambulatory Visit (HOSPITAL_COMMUNITY): Admission: RE | Admit: 2023-01-21 | Payer: 59 | Source: Ambulatory Visit

## 2023-01-21 ENCOUNTER — Other Ambulatory Visit: Payer: Self-pay

## 2023-01-21 ENCOUNTER — Other Ambulatory Visit (HOSPITAL_COMMUNITY): Payer: Self-pay

## 2023-01-21 MED ORDER — HYDRALAZINE HCL 10 MG PO TABS
20.0000 mg | ORAL_TABLET | Freq: Three times a day (TID) | ORAL | 4 refills | Status: DC
  Filled 2023-01-21 – 2023-01-31 (×4): qty 180, 30d supply, fill #0
  Filled 2023-01-31: qty 90, 15d supply, fill #0
  Filled 2023-01-31: qty 180, 30d supply, fill #0
  Filled 2023-01-31: qty 90, 15d supply, fill #0
  Filled 2023-02-14: qty 180, 30d supply, fill #1
  Filled 2023-03-10 – 2023-03-17 (×2): qty 180, 30d supply, fill #2
  Filled 2023-04-18: qty 180, 30d supply, fill #3
  Filled 2023-05-04 – 2023-05-13 (×3): qty 180, 30d supply, fill #4

## 2023-01-21 NOTE — Progress Notes (Signed)
   01/21/2023  Patient ID: [name], female   DOB: April 18, 1951, 72 y.o.   MRN: [id number]  Telephone follow-up to check on medication access and adherence  -Patient recently returned home from hip replacement surgery and subsequent ED visit for syncope found to be due to low Hgb -Patient is now home living independently and states PT will be coming out on a regular basis.  [facility] home nurse will continue to come out to assist with  medications, too -Endorses assistance with ADL's and showering at least once per week would be greatly helpful- I will contact LCSW to see if they can assist with this need -Patient currently has all needed medications, but is expressing concern with current order for hydralazine stating she takes one 10mg  tablet 4 times daily.  She will soon be running out of her current prescription with directions of 20mg  3 times daily.  Requesting prescription with these directions, as this keeps her BP better controlled.  Patient is willing to have telephone visit if needed.  I will contact PCP and Cardiology to see if they are able to have telehealth visit around this; or if they would prescribe hydralazine this way for 2 months until patient's already scheduled visit with Dr. Servando Salina (cardiology). -[facility] has received all medications, so these will just need to be requested when patient begins to run low  Lenna Gilford, PharmD, DPLA

## 2023-01-21 NOTE — Progress Notes (Signed)
Spoke with Dr. Servando Salina orders to refill Hydralazine with updated instructions. See message below.   -Patient currently has all needed medications, but is expressing concern with current order for hydralazine stating she takes one 10mg  tablet 4 times daily.  She will soon be running out of her current prescription with directions of 20mg  3 times daily.  Requesting prescription with these directions, as this keeps her BP better controlled.  Patient is willing to have telephone visit if needed.  I will contact PCP and Cardiology to see if they are able to have telehealth visit around this; or if they would prescribe hydralazine this way for 2 months until patient's already scheduled visit with Dr. Servando Salina (cardiology).

## 2023-01-22 ENCOUNTER — Other Ambulatory Visit: Payer: Self-pay

## 2023-01-24 ENCOUNTER — Other Ambulatory Visit: Payer: Self-pay

## 2023-01-24 ENCOUNTER — Ambulatory Visit: Payer: Self-pay

## 2023-01-24 ENCOUNTER — Ambulatory Visit (INDEPENDENT_AMBULATORY_CARE_PROVIDER_SITE_OTHER): Payer: 59 | Admitting: Psychology

## 2023-01-24 ENCOUNTER — Encounter (HOSPITAL_COMMUNITY): Payer: Self-pay

## 2023-01-24 ENCOUNTER — Other Ambulatory Visit (HOSPITAL_COMMUNITY): Payer: Self-pay

## 2023-01-24 DIAGNOSIS — F4323 Adjustment disorder with mixed anxiety and depressed mood: Secondary | ICD-10-CM

## 2023-01-24 DIAGNOSIS — F4321 Adjustment disorder with depressed mood: Secondary | ICD-10-CM

## 2023-01-24 NOTE — Progress Notes (Signed)
Ezariah Nace LEWIS Time: 4:10p-5:00 p50 minutes   [name] is a 72 y.o. 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 (Status: maintained) -- No Description Entered  Diminished interest in or enjoyment of activities. (Status: maintained) -- No Description Entered  Lack of energy. (Status: maintained) -- No Description Entered  Sad affect, social withdrawal, anxiety, loss of interest in activities, and low energy. (Status: maintained) -- No Description Entered  Medication Status compliance  Safety unspecified If Suicidal or Homicidal State Action Taken: unspecified  Current Risk:  Medications Buspar (Dosage: 30mg  2X/day)  New Medication (Dosage: 250mg  2x/day)  New Medication (Dosage: 250mg  2x/day)  Prozac (Dosage: 40mg  daily)  Remeron (Dosage: 30mg  nightly)  xanax (Dosage: .5mg  2-3X/day)  Objectives Related Problem: Recognize, accept, and cope with feelings of depression. Description: Identify and replace thoughts and beliefs that support depression. Target Date: 2023-04-29 Frequency: Daily Modality: individual Progress: 75%  Related Problem: Recognize, accept, and cope with feelings of depression. Description: Verbalize an understanding of healthy and unhealthy emotions with the intent of increasing the use of healthy emotions to guide actions. Target Date: 2023-04-29 Frequency: Daily Modality: individual Progress: 85%  Related Problem: Recognize, accept, and cope with feelings of depression. Description: Increasingly verbalize hopeful and positive statements regarding self, others, and the future. Target Date: 2022-04-28 Frequency:  Daily Modality: individual Progress: 100%  Related Problem: Reduce fear, anxiety, and worry associated with the medical condition. Description: Share with significant others efforts to adapt successfully to the medical condition/diagnosis. Target Date: 2023-04-29 Frequency: Daily Modality: individual Progress: 85%  Related Problem: Reduce fear, anxiety, and worry associated with the medical condition. Description: Engage in social, productive, and recreational activities that are possible in spite of medical condition. Target Date: 2023-04-29 Frequency: Daily Modality: individual Progress: 90%  Related Problem: Reduce fear, anxiety, and worry associated with the medical condition. Description: Identify the coping skills and sources of emotional support that have been beneficial in the past. Target Date: 2023-04-29 Frequency: Daily Modality: individual Progress: 80%  Client Response full compliance  Service Location Location, [address] [place], [place] [address]  Service Code cpt [phone]  Related past to present  Facilitate problem solving  Identify/label emotions  Validate/empathize  Self care activities  Lifestyle change (exercise, nutrition)  Assess/facilitate readiness to change  Relaxation training  Rationally challenge thoughts or beliefs/cognitive restructuring  Comments  Dx: F43.21  Meds: Prozac, [name], patient is struggling to rebuild her life and adjust to her new circumstances. She suffers a negative self-concept and has little confidence in her judgement. She is seeking to reduce symptoms of anxiety and self-doubt and to re-define satisfa ction moving forward. Therapy will focus on cognitive restructuring to redefine her experiences and focus on those satisfying aspects of her life. Will target this goal completion  for April1, 2021 (completed). She is also wanting to utilize counseling to adjust  and accept her latest diagnosis of cancer. She wants assistance to better manage this news and remain positive throughout treatment. Goal date is 12-22. Patient has made progress, but her medical condition is variable and she requests additional treatment to manage moods throughout her medical ordeal. In addition, she is trying to manage feelings associated with her older son's mental health struggles. Revised goal date is 12-24.  Patient agrees to an audio session and is aware of the limitations of this platform. She is in the hospital and I am in office.   Aleina Contant says she cannot get home health care until she sees primary care. She had surgery and her doctor wants her to have home health care, but her insurance requires that she she her primary care doctor. Suggested that she get a message to her doctor to appeal to be seen earlier. She needs the help at home post-surgery ( and 2 hospitalizations). She states she is trying to remain positive, but it is becoming increasing difficult. She is relying on television to distract negative thoughts.                                                                                                                                                                                                         Kynsley Whitehouse LEWIS Time: 4:15p-5:00p 45 minutes

## 2023-01-24 NOTE — Patient Instructions (Signed)
Visit Information  Thank you for taking time to visit with me today. Please don't hesitate to contact me if I can be of assistance to you.   Following are the goals we discussed today:  - Engage with home health on 9/10 -Contact your primary care provider as needed   Our next appointment is by telephone on 9/13 at 1:45 pm  Please call the care guide team at [phone] if you need to cancel or reschedule your appointment.   If you are experiencing a Mental Health or Behavioral Health Crisis or need someone to talk to, please call 1-800-273-TALK (toll free, 24 hour hotline) go to [facility] [address], [place] [phone]) call 911  Patient verbalizes understanding of instructions and care plan provided today and agrees to view in MyChart. Active MyChart status and patient understanding of how to access instructions and care plan via MyChart confirmed with patient.     Bevelyn Ngo, BSW, CDP Social Worker, Certified Dementia Practitioner [facility] Care Management  Care Coordination [phone]

## 2023-01-24 NOTE — Patient Outreach (Signed)
  Care Coordination   Follow Up Visit Note   01/24/2023 Name: [name] MRN: [id number] DOB: 12-Oct-1950  [name] is a 72 y.o. year old female who sees Plotnikov, Georgina Quint, MD for primary care. I spoke with  [name] by phone today.  What matters to the patients health and wellness today?  Identify resources to obtain assistance in the home.    Goals Addressed             This Visit's Progress    Care Coordination Activities       Care Coordination Interventions: Collaboration with Sabino Niemann who indicates patient has returned home and is in need of assistance in the home Discussed the patient did go to her aunts house for a few days following her hip replacement. Patient reports she had to return to the hospital due to low hemoglobin. The patient chose to go home after her most recent inpatient stay as she did not feel like her aunt could care for her any longer Determined the patient has friends who are checking on her some. However, they do not have any medical training. Patient would like assistance obtaining a caregiver in the home Education provided on options for private pay caregivers - patient unable to cover out of pocket costs Patient reports she does have home health nursing and PT via [facility] - education provided on role of home health aid. Patient reports having a bath aid will be helpful Contacted [facility] - spoke with [place] who indicates patient does not have home health PT but does have orders for home health nursing. HH RN is scheduled to visit the home on 9/10. Eileen Stanford will communicate with [facility] RN regarding the need to request verbal orders to add [facility] PT and HH aid Discussed plans for SW to follow up with the patient over the next 5 days         SDOH assessments and interventions completed:  No     Care Coordination Interventions:  Yes, provided   Interventions Today    Flowsheet Row Most Recent Value  Chronic Disease    Chronic disease during today's visit Other  [Recent hip replacement,  Impaired mobility]  General Interventions   General Interventions Discussed/Reviewed General Interventions Reviewed, Communication with  [Discussed patient has returned home and is having difficulty caring for herself]  Communication with RN        Follow up plan:  SW will continue to follow    Encounter Outcome:  Patient Visit Completed   Bevelyn Ngo, Kenard Gower, CDP Social Worker, Certified Dementia Practitioner [facility] Care Management  Care Coordination [phone]

## 2023-01-25 ENCOUNTER — Other Ambulatory Visit (HOSPITAL_COMMUNITY): Payer: Self-pay

## 2023-01-25 ENCOUNTER — Telehealth: Payer: Self-pay | Admitting: Internal Medicine

## 2023-01-25 DIAGNOSIS — M5030 Other cervical disc degeneration, unspecified cervical region: Secondary | ICD-10-CM | POA: Diagnosis not present

## 2023-01-25 DIAGNOSIS — G43909 Migraine, unspecified, not intractable, without status migrainosus: Secondary | ICD-10-CM | POA: Diagnosis not present

## 2023-01-25 DIAGNOSIS — J45909 Unspecified asthma, uncomplicated: Secondary | ICD-10-CM | POA: Diagnosis not present

## 2023-01-25 DIAGNOSIS — E213 Hyperparathyroidism, unspecified: Secondary | ICD-10-CM | POA: Diagnosis not present

## 2023-01-25 DIAGNOSIS — E871 Hypo-osmolality and hyponatremia: Secondary | ICD-10-CM | POA: Diagnosis not present

## 2023-01-25 DIAGNOSIS — M797 Fibromyalgia: Secondary | ICD-10-CM | POA: Diagnosis not present

## 2023-01-25 DIAGNOSIS — D631 Anemia in chronic kidney disease: Secondary | ICD-10-CM | POA: Diagnosis not present

## 2023-01-25 DIAGNOSIS — K589 Irritable bowel syndrome without diarrhea: Secondary | ICD-10-CM | POA: Diagnosis not present

## 2023-01-25 DIAGNOSIS — N1831 Chronic kidney disease, stage 3a: Secondary | ICD-10-CM | POA: Diagnosis not present

## 2023-01-25 DIAGNOSIS — Z7982 Long term (current) use of aspirin: Secondary | ICD-10-CM | POA: Diagnosis not present

## 2023-01-25 DIAGNOSIS — Z96641 Presence of right artificial hip joint: Secondary | ICD-10-CM | POA: Diagnosis not present

## 2023-01-25 DIAGNOSIS — I700 Atherosclerosis of aorta: Secondary | ICD-10-CM | POA: Diagnosis not present

## 2023-01-25 DIAGNOSIS — E559 Vitamin D deficiency, unspecified: Secondary | ICD-10-CM | POA: Diagnosis not present

## 2023-01-25 DIAGNOSIS — R911 Solitary pulmonary nodule: Secondary | ICD-10-CM | POA: Diagnosis not present

## 2023-01-25 DIAGNOSIS — M47812 Spondylosis without myelopathy or radiculopathy, cervical region: Secondary | ICD-10-CM | POA: Diagnosis not present

## 2023-01-25 DIAGNOSIS — D630 Anemia in neoplastic disease: Secondary | ICD-10-CM | POA: Diagnosis not present

## 2023-01-25 DIAGNOSIS — I129 Hypertensive chronic kidney disease with stage 1 through stage 4 chronic kidney disease, or unspecified chronic kidney disease: Secondary | ICD-10-CM | POA: Diagnosis not present

## 2023-01-25 DIAGNOSIS — G8929 Other chronic pain: Secondary | ICD-10-CM | POA: Diagnosis not present

## 2023-01-25 DIAGNOSIS — H04123 Dry eye syndrome of bilateral lacrimal glands: Secondary | ICD-10-CM | POA: Diagnosis not present

## 2023-01-25 DIAGNOSIS — D509 Iron deficiency anemia, unspecified: Secondary | ICD-10-CM | POA: Diagnosis not present

## 2023-01-25 DIAGNOSIS — N200 Calculus of kidney: Secondary | ICD-10-CM | POA: Diagnosis not present

## 2023-01-25 NOTE — Telephone Encounter (Signed)
[facility] requesting verbal orders for patients add skilled nursing weekly for 5 weeks and add home health aide. please call [phone].

## 2023-01-26 ENCOUNTER — Telehealth: Payer: Self-pay | Admitting: *Deleted

## 2023-01-26 NOTE — Telephone Encounter (Signed)
Rescheduled patient's follow up appointment with Dr. [name] for Friday, November 22 nd at 4:15 pm.

## 2023-01-26 NOTE — Telephone Encounter (Signed)
Okay.  Thanks.

## 2023-01-26 NOTE — Telephone Encounter (Signed)
LVM with verbal okay for add skilled nursing weekly for 5 weeks and add home health aide.

## 2023-01-26 NOTE — Telephone Encounter (Signed)
Attempted to reach Ms. [name] and unable to leave message due to voicemail box not set up.  Patient's son [name] notified and voicemail left requesting call back.   Spoke with patient's Aunt Alcus Dad and relayed message that Ms. Marks appointment with Dr. [name] tomorrow at 1:15 will be pushed out to November/December until she is fully healed. before Ms. Mallory Shirk states that patient can't receive calls from her phone but she would give her the message to call the office.

## 2023-01-27 ENCOUNTER — Other Ambulatory Visit (HOSPITAL_COMMUNITY): Payer: Self-pay

## 2023-01-27 ENCOUNTER — Encounter: Payer: Self-pay | Admitting: Internal Medicine

## 2023-01-27 ENCOUNTER — Inpatient Hospital Stay: Payer: 59 | Admitting: Gynecologic Oncology

## 2023-01-27 ENCOUNTER — Telehealth: Payer: Self-pay

## 2023-01-27 ENCOUNTER — Other Ambulatory Visit: Payer: Self-pay

## 2023-01-27 ENCOUNTER — Ambulatory Visit: Payer: Self-pay

## 2023-01-27 NOTE — Patient Outreach (Signed)
  Care Coordination   Care Coordination  Visit Note   01/27/2023 Name: ANTHONY SCHAAD MRN: [id number] DOB: Nov 10, 1950  ZYKEIA NASLUND is a 72 y.o. year old female who sees Plotnikov, Georgina Quint, MD for primary care.   Collaboration/Coordination:  Collaboration re: patient care with BSW. Per review of chart, patient active with home health. BSW scheduled to outreach to patient tomorrow.   Goals Addressed             This Visit's Progress    Assist with Health managment       Interventions Today    Flowsheet Row Most Recent Value  General Interventions   General Interventions Discussed/Reviewed Communication with  Communication with Social Work  [regarding patient status]            SDOH assessments and interventions completed:  No  Care Coordination Interventions:  Yes, provided   Follow up plan:  continue to follow    Encounter Outcome:  Patient Visit Completed   Kathyrn Sheriff, RN, MSN, BSN, CCM Care Management Coordinator [phone]

## 2023-01-27 NOTE — Progress Notes (Signed)
Virtual Visit via Video Note  I connected with [name] on 01/27/23 at  8:30 AM EDT by a video enabled telemedicine application and verified that I am speaking with the correct person using two identifiers.   I discussed the limitations of evaluation and management by telemedicine and the availability of in person appointments. The patient expressed understanding and agreed to proceed.  Present for the visit:  Myself, Dr Cheryll Cockayne, Renaee Munda.  The patient is currently at home and I am in the office.    No referring provider.    History of Present Illness: This is an acute visit for referral to home care.   She would like a Agricultural engineer - help with bathing, simple meal prep and help to get back and forth to bathroom.    Had right hip replacement surgery , fell and passed out and hurt herself - Left hip bruising.  Was not sure how long she was passed out.  Ems was called - took to the hospital- hgb 7 - s/p 2 units prbc, stayed over night.  Ortho saw her.  Saw SW - at first advised rehab - insurance did not cover it.  She had to go back home and is struggling with her ADLs.  [facility] nurse was coming once a week prior to her hip replacement and is still coming out- nurse helps w meds.   Her home health nurse recommended to her that she needed a home health assistant because she is having difficulty getting into the bathtub and some difficulty with toileting.  Right leg swelling x 3 times normal.  She is very short of breath.  She is very weak and tired.  She has poor balance.  She has difficulty concentrating.  She still feels foggy, which started prior to her hip replacement.  She has difficulty bathing, making her meals and toileting.  Getting up and down and transferring can be difficult at times.      Review of Systems  Constitutional:  Positive for malaise/fatigue. Negative for fever.       Feels hot and cold   Respiratory:  Positive for cough, shortness of  breath and wheezing.   Cardiovascular:  Positive for palpitations and leg swelling. Negative for chest pain.      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 Serially monogamous relationships. Remarried March '11. 2 sons- '82, '87.    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 (June "12).    Lives in her own home.  Currently going through a divorce.   Social Determinants of Health   Financial Resource Strain: Low Risk  (12/15/2021)   Overall Financial Resource Strain (CARDIA)    Difficulty of Paying  Living Expenses: Not hard at all  Food Insecurity: No Food Insecurity (01/18/2023)   Hunger Vital Sign    Worried About Running Out of Food in the Last Year: Never true    Ran Out of Food in the Last Year: Never true  Recent Concern: Food Insecurity - Food Insecurity Present (11/24/2022)   Hunger Vital Sign    Worried About Running Out of Food in the Last Year: Often true    Ran Out of Food in the Last Year: Often true  Transportation Needs: No Transportation Needs (01/18/2023)   PRAPARE - Administrator, Civil Service (Medical): No    Lack of Transportation (Non-Medical): No  Recent Concern: Transportation Needs - Unmet Transportation Needs (11/24/2022)   PRAPARE - Transportation    Lack of Transportation  (Medical): Yes    Lack of Transportation (Non-Medical): Yes  Physical Activity: Sufficiently Active (12/15/2021)   Exercise Vital Sign    Days of Exercise per Week: 5 days    Minutes of Exercise per Session: 40 min  Stress: No Stress Concern Present (12/15/2021)   [employer] of Occupational Health - Occupational Stress Questionnaire    Feeling of Stress : Not at all  Social Connections: Moderately Integrated (12/15/2021)   Social Connection and Isolation Panel [NHANES]    Frequency of Communication with Friends and Family: More than three times a week    Frequency of Social Gatherings with Friends and Family: More than three times a week    Attends Religious Services: More than 4 times per year    Active Member of [employer] or Organizations: Yes    Attends Engineer, structural: More than 4 times per year    Marital Status: Divorced     Observations/Objective: Appears well in NAD After movement from 1 place to another she was slightly short of breath  Assessment and Plan:  Physical deconditioning, poor balance: Acute on what sounds like chronic Multifactorial in nature-recent hip replacement with significant anemia following Having long, slow recovery since surgery Not yet able to do physical therapy per orthopedics Has home nurse coming to help with her medications Because of significant physical deconditioning and poor balance ideally she would benefit from being in a rehab, but since that is not covered by insurance we will request home health aide through [facility] Would benefit greatly from PT once able   Follow Up Instructions:    I discussed the assessment and treatment plan with the patient. The patient was provided an opportunity to ask questions and all were answered. The patient agreed with the plan and demonstrated an understanding of the instructions.   The patient was advised to call back or seek an in-person evaluation if the symptoms worsen or if the  condition fails to improve as anticipated.    Pincus Sanes, MD

## 2023-01-27 NOTE — Patient Outreach (Signed)
  Care Coordination   01/27/2023 Name: [name] MRN: [id number] DOB: [birth year]   Care Coordination Outreach Attempts:  An unsuccessful telephone outreach was attempted today to offer the patient information about available care coordination services.  Follow Up Plan:  Additional outreach attempts will be made to offer the patient care coordination information and services.   Encounter Outcome:  No Answer   Care Coordination Interventions:  No, not indicated    Kathyrn Sheriff, RN, MSN, BSN, CCM Care Management Coordinator [phone]

## 2023-01-28 ENCOUNTER — Telehealth: Payer: Self-pay

## 2023-01-28 ENCOUNTER — Telehealth (INDEPENDENT_AMBULATORY_CARE_PROVIDER_SITE_OTHER): Payer: 59 | Admitting: Internal Medicine

## 2023-01-28 ENCOUNTER — Other Ambulatory Visit (HOSPITAL_COMMUNITY): Payer: Self-pay

## 2023-01-28 DIAGNOSIS — R2689 Other abnormalities of gait and mobility: Secondary | ICD-10-CM | POA: Diagnosis not present

## 2023-01-28 DIAGNOSIS — R5381 Other malaise: Secondary | ICD-10-CM

## 2023-01-28 NOTE — Patient Outreach (Signed)
Care Coordination   01/28/2023 Name: [name] MRN: [id number] DOB: 1951-02-08   Care Coordination Outreach Attempts:  An unsuccessful telephone outreach was attempted for a scheduled appointment today.  Follow Up Plan:  Additional outreach attempts will be made to offer the patient care coordination information and services.   Encounter Outcome:  No Answer   Care Coordination Interventions:  No, not indicated    Bevelyn Ngo, BSW, CDP [place]  [facility], [facility] Social Worker Direct Dial: [phone]  Fax: [phone]
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
## 2023-01-31 ENCOUNTER — Other Ambulatory Visit (HOSPITAL_COMMUNITY): Payer: Self-pay

## 2023-01-31 ENCOUNTER — Emergency Department (HOSPITAL_COMMUNITY)
Admission: EM | Admit: 2023-01-31 | Discharge: 2023-02-01 | Discharge status: Against Medical Advice | Payer: 59 | Attending: Emergency Medicine | Admitting: Emergency Medicine

## 2023-01-31 ENCOUNTER — Other Ambulatory Visit: Payer: Self-pay

## 2023-01-31 ENCOUNTER — Telehealth: Payer: Self-pay | Admitting: Internal Medicine

## 2023-01-31 ENCOUNTER — Encounter (HOSPITAL_COMMUNITY): Payer: Self-pay

## 2023-01-31 DIAGNOSIS — Z5321 Procedure and treatment not carried out due to patient leaving prior to being seen by health care provider: Secondary | ICD-10-CM | POA: Diagnosis not present

## 2023-01-31 DIAGNOSIS — M5030 Other cervical disc degeneration, unspecified cervical region: Secondary | ICD-10-CM | POA: Diagnosis not present

## 2023-01-31 DIAGNOSIS — D631 Anemia in chronic kidney disease: Secondary | ICD-10-CM | POA: Diagnosis not present

## 2023-01-31 DIAGNOSIS — Z96641 Presence of right artificial hip joint: Secondary | ICD-10-CM | POA: Diagnosis not present

## 2023-01-31 DIAGNOSIS — J45909 Unspecified asthma, uncomplicated: Secondary | ICD-10-CM | POA: Diagnosis not present

## 2023-01-31 DIAGNOSIS — K589 Irritable bowel syndrome without diarrhea: Secondary | ICD-10-CM | POA: Diagnosis not present

## 2023-01-31 DIAGNOSIS — M47812 Spondylosis without myelopathy or radiculopathy, cervical region: Secondary | ICD-10-CM | POA: Diagnosis not present

## 2023-01-31 DIAGNOSIS — M797 Fibromyalgia: Secondary | ICD-10-CM | POA: Diagnosis not present

## 2023-01-31 DIAGNOSIS — E213 Hyperparathyroidism, unspecified: Secondary | ICD-10-CM | POA: Diagnosis not present

## 2023-01-31 DIAGNOSIS — I700 Atherosclerosis of aorta: Secondary | ICD-10-CM | POA: Diagnosis not present

## 2023-01-31 DIAGNOSIS — Z743 Need for continuous supervision: Secondary | ICD-10-CM | POA: Diagnosis not present

## 2023-01-31 DIAGNOSIS — D509 Iron deficiency anemia, unspecified: Secondary | ICD-10-CM | POA: Diagnosis not present

## 2023-01-31 DIAGNOSIS — M25551 Pain in right hip: Secondary | ICD-10-CM | POA: Diagnosis not present

## 2023-01-31 DIAGNOSIS — E871 Hypo-osmolality and hyponatremia: Secondary | ICD-10-CM | POA: Diagnosis not present

## 2023-01-31 DIAGNOSIS — D649 Anemia, unspecified: Secondary | ICD-10-CM | POA: Diagnosis not present

## 2023-01-31 DIAGNOSIS — N1831 Chronic kidney disease, stage 3a: Secondary | ICD-10-CM | POA: Diagnosis not present

## 2023-01-31 DIAGNOSIS — R5383 Other fatigue: Secondary | ICD-10-CM | POA: Diagnosis not present

## 2023-01-31 DIAGNOSIS — R531 Weakness: Secondary | ICD-10-CM | POA: Diagnosis not present

## 2023-01-31 DIAGNOSIS — I129 Hypertensive chronic kidney disease with stage 1 through stage 4 chronic kidney disease, or unspecified chronic kidney disease: Secondary | ICD-10-CM | POA: Diagnosis not present

## 2023-01-31 DIAGNOSIS — G43909 Migraine, unspecified, not intractable, without status migrainosus: Secondary | ICD-10-CM | POA: Diagnosis not present

## 2023-01-31 DIAGNOSIS — H04123 Dry eye syndrome of bilateral lacrimal glands: Secondary | ICD-10-CM | POA: Diagnosis not present

## 2023-01-31 DIAGNOSIS — R911 Solitary pulmonary nodule: Secondary | ICD-10-CM | POA: Diagnosis not present

## 2023-01-31 DIAGNOSIS — N200 Calculus of kidney: Secondary | ICD-10-CM | POA: Diagnosis not present

## 2023-01-31 DIAGNOSIS — E559 Vitamin D deficiency, unspecified: Secondary | ICD-10-CM | POA: Diagnosis not present

## 2023-01-31 DIAGNOSIS — D630 Anemia in neoplastic disease: Secondary | ICD-10-CM | POA: Diagnosis not present

## 2023-01-31 DIAGNOSIS — R6889 Other general symptoms and signs: Secondary | ICD-10-CM | POA: Diagnosis not present

## 2023-01-31 DIAGNOSIS — G8929 Other chronic pain: Secondary | ICD-10-CM | POA: Diagnosis not present

## 2023-01-31 DIAGNOSIS — R197 Diarrhea, unspecified: Secondary | ICD-10-CM | POA: Diagnosis not present

## 2023-01-31 DIAGNOSIS — Z7982 Long term (current) use of aspirin: Secondary | ICD-10-CM | POA: Diagnosis not present

## 2023-01-31 LAB — URINALYSIS, ROUTINE W REFLEX MICROSCOPIC
Bilirubin Urine: NEGATIVE
Glucose, UA: NEGATIVE mg/dL
Hgb urine dipstick: NEGATIVE
Ketones, ur: NEGATIVE mg/dL
Nitrite: NEGATIVE
Protein, ur: NEGATIVE mg/dL
Specific Gravity, Urine: 1.008 (ref 1.005–1.030)
pH: 5 (ref 5.0–8.0)

## 2023-01-31 LAB — CBC
HCT: 37 % (ref 36.0–46.0)
Hemoglobin: 11.8 g/dL — ABNORMAL LOW (ref 12.0–15.0)
MCH: 31.3 pg (ref 26.0–34.0)
MCHC: 31.9 g/dL (ref 30.0–36.0)
MCV: 98.1 fL (ref 80.0–100.0)
Platelets: 421 10*3/uL — ABNORMAL HIGH (ref 150–400)
RBC: 3.77 MIL/uL — ABNORMAL LOW (ref 3.87–5.11)
RDW: 13.3 % (ref 11.5–15.5)
WBC: 7.7 10*3/uL (ref 4.0–10.5)
nRBC: 0 % (ref 0.0–0.2)

## 2023-01-31 LAB — BASIC METABOLIC PANEL
Anion gap: 11 (ref 5–15)
BUN: 17 mg/dL (ref 8–23)
CO2: 21 mmol/L — ABNORMAL LOW (ref 22–32)
Calcium: 9.2 mg/dL (ref 8.9–10.3)
Chloride: 106 mmol/L (ref 98–111)
Creatinine, Ser: 0.96 mg/dL (ref 0.44–1.00)
GFR, Estimated: >60 mL/min (ref >60)
Glucose, Bld: 101 mg/dL — ABNORMAL HIGH (ref 70–99)
Potassium: 4.2 mmol/L (ref 3.5–5.1)
Sodium: 138 mmol/L (ref 135–145)

## 2023-01-31 NOTE — ED Triage Notes (Signed)
Pt arrived via EMS, from home. Has felt fatigued since hip replacement in August. Was seen recently for fatigue and hemoglobin was low.

## 2023-01-31 NOTE — Telephone Encounter (Signed)
Pt went to the ER today and she has an UTI pt wanting to know if Dr. Macario Golds can prescribe her some antibiotics. Please advise.

## 2023-02-01 ENCOUNTER — Telehealth: Payer: Self-pay

## 2023-02-01 ENCOUNTER — Encounter: Payer: Self-pay | Admitting: Internal Medicine

## 2023-02-01 ENCOUNTER — Other Ambulatory Visit: Payer: Self-pay

## 2023-02-01 ENCOUNTER — Ambulatory Visit: Payer: 59 | Attending: Adult Health | Admitting: Adult Health

## 2023-02-01 DIAGNOSIS — I951 Orthostatic hypotension: Secondary | ICD-10-CM | POA: Diagnosis not present

## 2023-02-01 DIAGNOSIS — J4530 Mild persistent asthma, uncomplicated: Secondary | ICD-10-CM | POA: Diagnosis not present

## 2023-02-01 DIAGNOSIS — E559 Vitamin D deficiency, unspecified: Secondary | ICD-10-CM | POA: Diagnosis not present

## 2023-02-01 DIAGNOSIS — D509 Iron deficiency anemia, unspecified: Secondary | ICD-10-CM | POA: Diagnosis not present

## 2023-02-01 DIAGNOSIS — Z471 Aftercare following joint replacement surgery: Secondary | ICD-10-CM | POA: Diagnosis not present

## 2023-02-01 DIAGNOSIS — N1832 Chronic kidney disease, stage 3b: Secondary | ICD-10-CM | POA: Diagnosis not present

## 2023-02-01 DIAGNOSIS — G8929 Other chronic pain: Secondary | ICD-10-CM | POA: Diagnosis not present

## 2023-02-01 DIAGNOSIS — Z96641 Presence of right artificial hip joint: Secondary | ICD-10-CM | POA: Diagnosis not present

## 2023-02-01 DIAGNOSIS — G43909 Migraine, unspecified, not intractable, without status migrainosus: Secondary | ICD-10-CM | POA: Diagnosis not present

## 2023-02-01 DIAGNOSIS — M797 Fibromyalgia: Secondary | ICD-10-CM | POA: Diagnosis not present

## 2023-02-01 DIAGNOSIS — M1712 Unilateral primary osteoarthritis, left knee: Secondary | ICD-10-CM | POA: Diagnosis not present

## 2023-02-01 DIAGNOSIS — K589 Irritable bowel syndrome without diarrhea: Secondary | ICD-10-CM | POA: Diagnosis not present

## 2023-02-01 DIAGNOSIS — D630 Anemia in neoplastic disease: Secondary | ICD-10-CM | POA: Diagnosis not present

## 2023-02-01 DIAGNOSIS — M5030 Other cervical disc degeneration, unspecified cervical region: Secondary | ICD-10-CM | POA: Diagnosis not present

## 2023-02-01 DIAGNOSIS — E213 Hyperparathyroidism, unspecified: Secondary | ICD-10-CM | POA: Diagnosis not present

## 2023-02-01 DIAGNOSIS — I700 Atherosclerosis of aorta: Secondary | ICD-10-CM | POA: Diagnosis not present

## 2023-02-01 DIAGNOSIS — D631 Anemia in chronic kidney disease: Secondary | ICD-10-CM | POA: Diagnosis not present

## 2023-02-01 DIAGNOSIS — N200 Calculus of kidney: Secondary | ICD-10-CM | POA: Diagnosis not present

## 2023-02-01 DIAGNOSIS — M47812 Spondylosis without myelopathy or radiculopathy, cervical region: Secondary | ICD-10-CM | POA: Diagnosis not present

## 2023-02-01 DIAGNOSIS — I129 Hypertensive chronic kidney disease with stage 1 through stage 4 chronic kidney disease, or unspecified chronic kidney disease: Secondary | ICD-10-CM | POA: Diagnosis not present

## 2023-02-01 NOTE — Patient Outreach (Signed)
Care Coordination   02/01/2023 Name: [name] MRN: [id number] DOB: [birth year]   Care Coordination Outreach Attempts:  A second unsuccessful outreach was attempted today to offer the patient with information about available care coordination services.  Follow Up Plan:  Additional outreach attempts will be made to offer the patient care coordination information and services.   Encounter Outcome:  No Answer   Care Coordination Interventions:  No, not indicated    Bevelyn Ngo, BSW, CDP [place]  [facility], [facility] Social Worker Direct Dial: [phone]  Fax: [phone]

## 2023-02-02 ENCOUNTER — Telehealth: Payer: Self-pay | Admitting: Pharmacist

## 2023-02-02 ENCOUNTER — Other Ambulatory Visit (HOSPITAL_COMMUNITY): Payer: Self-pay

## 2023-02-02 ENCOUNTER — Other Ambulatory Visit: Payer: Self-pay | Admitting: Internal Medicine

## 2023-02-02 ENCOUNTER — Ambulatory Visit: Payer: 59 | Admitting: Psychology

## 2023-02-02 ENCOUNTER — Ambulatory Visit: Payer: Self-pay

## 2023-02-02 DIAGNOSIS — M797 Fibromyalgia: Secondary | ICD-10-CM | POA: Diagnosis not present

## 2023-02-02 DIAGNOSIS — E559 Vitamin D deficiency, unspecified: Secondary | ICD-10-CM | POA: Diagnosis not present

## 2023-02-02 DIAGNOSIS — F4321 Adjustment disorder with depressed mood: Secondary | ICD-10-CM | POA: Diagnosis not present

## 2023-02-02 DIAGNOSIS — G8929 Other chronic pain: Secondary | ICD-10-CM | POA: Diagnosis not present

## 2023-02-02 DIAGNOSIS — Z96641 Presence of right artificial hip joint: Secondary | ICD-10-CM | POA: Diagnosis not present

## 2023-02-02 DIAGNOSIS — M47812 Spondylosis without myelopathy or radiculopathy, cervical region: Secondary | ICD-10-CM | POA: Diagnosis not present

## 2023-02-02 DIAGNOSIS — D631 Anemia in chronic kidney disease: Secondary | ICD-10-CM | POA: Diagnosis not present

## 2023-02-02 DIAGNOSIS — E213 Hyperparathyroidism, unspecified: Secondary | ICD-10-CM | POA: Diagnosis not present

## 2023-02-02 DIAGNOSIS — M5030 Other cervical disc degeneration, unspecified cervical region: Secondary | ICD-10-CM | POA: Diagnosis not present

## 2023-02-02 DIAGNOSIS — G43909 Migraine, unspecified, not intractable, without status migrainosus: Secondary | ICD-10-CM | POA: Diagnosis not present

## 2023-02-02 DIAGNOSIS — N1832 Chronic kidney disease, stage 3b: Secondary | ICD-10-CM | POA: Diagnosis not present

## 2023-02-02 DIAGNOSIS — I700 Atherosclerosis of aorta: Secondary | ICD-10-CM | POA: Diagnosis not present

## 2023-02-02 DIAGNOSIS — I951 Orthostatic hypotension: Secondary | ICD-10-CM | POA: Diagnosis not present

## 2023-02-02 DIAGNOSIS — M1712 Unilateral primary osteoarthritis, left knee: Secondary | ICD-10-CM | POA: Diagnosis not present

## 2023-02-02 DIAGNOSIS — K589 Irritable bowel syndrome without diarrhea: Secondary | ICD-10-CM | POA: Diagnosis not present

## 2023-02-02 DIAGNOSIS — N200 Calculus of kidney: Secondary | ICD-10-CM | POA: Diagnosis not present

## 2023-02-02 DIAGNOSIS — D630 Anemia in neoplastic disease: Secondary | ICD-10-CM | POA: Diagnosis not present

## 2023-02-02 DIAGNOSIS — F4323 Adjustment disorder with mixed anxiety and depressed mood: Secondary | ICD-10-CM

## 2023-02-02 DIAGNOSIS — D509 Iron deficiency anemia, unspecified: Secondary | ICD-10-CM | POA: Diagnosis not present

## 2023-02-02 DIAGNOSIS — Z471 Aftercare following joint replacement surgery: Secondary | ICD-10-CM | POA: Diagnosis not present

## 2023-02-02 DIAGNOSIS — J4530 Mild persistent asthma, uncomplicated: Secondary | ICD-10-CM | POA: Diagnosis not present

## 2023-02-02 DIAGNOSIS — I129 Hypertensive chronic kidney disease with stage 1 through stage 4 chronic kidney disease, or unspecified chronic kidney disease: Secondary | ICD-10-CM | POA: Diagnosis not present

## 2023-02-02 MED ORDER — CIPROFLOXACIN HCL 250 MG PO TABS: 250.0000 mg | ORAL_TABLET | Freq: Two times a day (BID) | ORAL | 0 refills | Status: DC

## 2023-02-02 MED ORDER — CIPROFLOXACIN HCL 250 MG PO TABS
250.0000 mg | ORAL_TABLET | Freq: Two times a day (BID) | ORAL | 0 refills | Status: DC
  Filled 2023-02-02: qty 20, 10d supply, fill #0

## 2023-02-02 NOTE — Progress Notes (Addendum)
[name]   [name] is a 72 y.o. 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 (Status: maintained) -- No Description Entered  Diminished interest in or enjoyment of activities. (Status: maintained) -- No Description Entered  Lack of energy. (Status: maintained) -- No Description Entered  Sad affect, social withdrawal, anxiety, loss of interest in activities, and low energy. (Status: maintained) -- No Description Entered  Medication Status compliance  Safety unspecified If Suicidal or Homicidal State Action Taken: unspecified  Current Risk:  Medications Buspar (Dosage: 30mg  2X/day)  New Medication (Dosage: 250mg  2x/day)  New Medication (Dosage: 250mg  2x/day)  Prozac (Dosage: 40mg  daily)  Remeron (Dosage: 30mg  nightly)  xanax (Dosage: .5mg  2-3X/day)  Objectives Related Problem: Recognize, accept, and cope with feelings of depression. Description: Identify and replace thoughts and beliefs that support depression. Target Date: 2023-04-29 Frequency: Daily Modality: individual Progress: 75%  Related Problem: Recognize, accept, and cope with feelings of depression. Description: Verbalize an understanding of healthy and unhealthy emotions with the intent of increasing the use of healthy emotions to guide actions. Target Date: 2023-04-29 Frequency: Daily Modality: individual Progress: 85%  Related Problem: Recognize, accept, and cope with feelings of depression. Description: Increasingly verbalize hopeful and positive statements regarding self, others, and the future. Target Date: 2022-04-28 Frequency:  Daily Modality: individual Progress: 100%  Related Problem: Reduce fear, anxiety, and worry associated with the medical condition. Description: Share with significant others efforts to adapt successfully to the medical condition/diagnosis. Target Date: 2023-04-29 Frequency: Daily Modality: individual Progress: 85%  Related Problem: Reduce fear, anxiety, and worry associated with the medical condition. Description: Engage in social, productive, and recreational activities that are possible in spite of medical condition. Target Date: 2023-04-29 Frequency: Daily Modality: individual Progress: 90%  Related Problem: Reduce fear, anxiety, and worry associated with the medical condition. Description: Identify the coping skills and sources of emotional support that have been beneficial in the past. Target Date: 2023-04-29 Frequency: Daily Modality: individual Progress: 80%  Client Response full compliance  Service Location Location, [address] [place], [place] [address]  Service Code cpt [phone]  Related past to present  Facilitate problem solving  Identify/label emotions  Validate/empathize  Self care activities  Lifestyle change (exercise, nutrition)  Assess/facilitate readiness to change  Relaxation training  Rationally challenge thoughts or beliefs/cognitive restructuring  Comments  Dx: F43.21  Meds: Prozac, [name], patient is struggling to rebuild her life and adjust to her new circumstances. She suffers a negative self-concept and has little confidence in her judgement. She is seeking to reduce symptoms of anxiety and self-doubt and to re-define satisfa ction moving forward. Therapy will focus on cognitive restructuring to redefine her experiences and focus on  those satisfying aspects of her life. Will target this goal completion for April1, 2021 (completed). She is also wanting to utilize counseling to adjust  and accept her latest diagnosis of cancer. She wants assistance to better manage this news and remain positive throughout treatment. Goal date is 12-22. Patient has made progress, but her medical condition is variable and she requests additional treatment to manage moods throughout her medical ordeal. In addition, she is trying to manage feelings associated with her older son's mental health struggles. Revised goal date is 12-24.  Patient agrees to an audio session and is aware of the limitations of this platform. She is in the hospital and I am in office.   [name] has another UTI and is back on anti-biotics. She feels terrible both mentally and physically. She says it leaves her feeling discouraged and depressed. She says she is not suicidal, but feels powerless to improve her health and situation. Says she is tired of not being able to go anywhere "or do anything". Has not been able to leave the house since the end of August. She is getting good support from some friends and neighbors. She does not have much of an appetite and has to make herself eat. Discussed importance of remaining positive. She feels the depression is more pronounced and is not sue the best course. Her sadness is compounded by her fears about her son and what has happened to him. Last she saw he was arrested for his third DUI. She fears he is in prison. We talked about the possibility of changing her medication due to her worsening depression. She will call doctor to discuss (Dr. Donell Beers).                                                                                                                                                                                                             [name] Time: 3:15p-4:00p 45 minutes

## 2023-02-02 NOTE — Patient Outreach (Unsigned)
Care Coordination   Care Coordination  Visit Note   02/03/2023 Name: [name] MRN: [id number] DOB: [birth year]  [name] is a 73 y.o. year old female who sees Plotnikov, Georgina Quint, MD for primary care.  Care Coordination/Collaboration with BSW regarding plan of care.     Goals Addressed             This Visit's Progress    Assist with Health managment       Interventions Today    Flowsheet Row Most Recent Value  General Interventions   General Interventions Discussed/Reviewed Communication with  Communication with Social Work  [employer] with BSW regarding unsuccessful outreach attempts and plan for [facility] to outreach next week.]            SDOH assessments and interventions completed:  No  Care Coordination Interventions:  Yes, provided   Follow up plan:  RNCM will outreach as previously scheduled next week 02/07/23    Encounter Outcome:  Patient Visit Completed   Kathyrn Sheriff, RN, MSN, BSN, CCM Care Management Coordinator [phone]

## 2023-02-02 NOTE — Telephone Encounter (Signed)
Outreach to patient to communicate information from [employer] as they have been unable to reach her to follow up about her upcoming medication delivery and ask if she wants her OTC meds delivered as well.

## 2023-02-02 NOTE — Patient Outreach (Signed)
Care Coordination   Documentation  Note   02/02/2023 Name: [name] MRN: [id number] DOB: 1950-06-19  [name] is a 72 y.o. year old female who sees Plotnikov, Georgina Quint, MD for primary care. I  placed a third unsuccessful outbound call to the patient in an attempt to follow up on care coordination needs. I collaborated with RN Care Manager Kathyrn Sheriff to discuss barriers with outreaching patient and plans for discipline closure. RN Care Manager will follow up with the patient as scheduled on  9/23.  What matters to the patients health and wellness today?  SW did not speak with the patient today.    Goals Addressed             This Visit's Progress    COMPLETED: Care Coordination Activities       Care Coordination Interventions: Goal closed due to inability to maintain patient contact         SDOH assessments and interventions completed:  No     Care Coordination Interventions:  Yes, provided   Interventions Today    Flowsheet Row Most Recent Value  Chronic Disease   Chronic disease during today's visit Other  [Recent hip replacement, impaired mobility]  General Interventions   General Interventions Discussed/Reviewed Communication with  Communication with RN  [Discussed plans for SW to close case due to inability to maintain patient contact. Based on chart review the patient has a UTI and has been in contact with her provider to obtain orders for an antibiotic. RN CM to follow up as scheduled]        Follow up plan: No further intervention required.   Encounter Outcome:  Patient Visit Completed   Bevelyn Ngo, BSW, CDP [facility], [facility] Social Worker Direct Dial: [phone]  Fax: [phone]

## 2023-02-04 NOTE — Telephone Encounter (Signed)
I sent her prescription for Cipro on 02/02/2023.  Thank you

## 2023-02-06 ENCOUNTER — Other Ambulatory Visit: Payer: Self-pay | Admitting: Cardiology

## 2023-02-07 ENCOUNTER — Ambulatory Visit: Payer: 59 | Admitting: Psychology

## 2023-02-07 ENCOUNTER — Telehealth: Payer: Self-pay

## 2023-02-07 DIAGNOSIS — F4321 Adjustment disorder with depressed mood: Secondary | ICD-10-CM

## 2023-02-07 DIAGNOSIS — F4323 Adjustment disorder with mixed anxiety and depressed mood: Secondary | ICD-10-CM

## 2023-02-07 NOTE — Progress Notes (Addendum)
[name] [name] Time: 4:10p-5:00 p50 minutes   [name] is a 72 y.o. female patient  Date: 02/07/2023  Treatment Plan Diagnosis F43.21 (Adjustment Disorder with mixed anxiety and depressed mood.  Symptoms A diagnosis of an acute, serious illness that is life-threatening. (Status: maintained) -- No Description Entered  Diminished interest in or enjoyment of activities. (Status: maintained) -- No Description Entered  Lack of energy. (Status: maintained) -- No Description Entered  Sad affect, social withdrawal, anxiety, loss of interest in activities, and low energy. (Status: maintained) -- No Description Entered  Medication Status compliance  Safety unspecified If Suicidal or Homicidal State Action Taken: unspecified  Current Risk:  Medications Buspar (Dosage: 30mg  2X/day)  New Medication (Dosage: 250mg  2x/day)  New Medication (Dosage: 250mg  2x/day)  Prozac (Dosage: 40mg  daily)  Remeron (Dosage: 30mg  nightly)  xanax (Dosage: .5mg  2-3X/day)  Objectives Related Problem: Recognize, accept, and cope with feelings of depression. Description: Identify and replace thoughts and beliefs that support depression. Target Date: 2023-04-29 Frequency: Daily Modality: individual Progress: 75%  Related Problem: Recognize, accept, and cope with feelings of depression. Description: Verbalize an understanding of healthy and unhealthy emotions with the intent of increasing the use of healthy emotions to guide actions. Target Date: 2023-04-29 Frequency: Daily Modality: individual Progress: 85%  Related Problem: Recognize, accept, and cope with feelings of depression. Description: Increasingly verbalize hopeful and positive statements regarding self, others, and the future. Target Date: 2022-04-28 Frequency: Daily Modality: individual Progress: 100%  Related Problem: Reduce fear, anxiety, and worry associated with the medical condition. Description: Share with significant  others efforts to adapt successfully to the medical condition/diagnosis. Target Date: 2023-04-29 Frequency: Daily Modality: individual Progress: 85%  Related Problem: Reduce fear, anxiety, and worry associated with the medical condition. Description: Engage in social, productive, and recreational activities that are possible in spite of medical condition. Target Date: 2023-04-29 Frequency: Daily Modality: individual Progress: 90%  Related Problem: Reduce fear, anxiety, and worry associated with the medical condition. Description: Identify the coping skills and sources of emotional support that have been beneficial in the past. Target Date: 2023-04-29 Frequency: Daily Modality: individual Progress: 80%  Client Response full compliance  Service Location Location, [address] [name], [place] [address]  Service Code cpt [phone]  Related past to present  Facilitate problem solving  Identify/label emotions  Validate/empathize  Self care activities  Lifestyle change (exercise, nutrition)  Assess/facilitate readiness to change  Relaxation training  Rationally challenge thoughts or beliefs/cognitive restructuring  Comments  Dx: F43.21  Meds: Prozac, [name], patient is struggling to rebuild her life and adjust to her new circumstances. She suffers a negative self-concept and has little confidence in her judgement. She is seeking to reduce symptoms of anxiety and self-doubt and to re-define satisfa ction moving forward. Therapy will focus on cognitive restructuring to redefine her experiences and focus on those satisfying aspects of her life. Will target this goal completion for April1, 2021 (completed). She is also wanting to utilize counseling to adjust and accept her latest diagnosis of cancer. She wants assistance to better manage this news and remain positive throughout treatment. Goal date is 12-22. Patient has made  progress, but her medical condition is variable and she requests additional treatment to manage moods throughout her medical ordeal. In addition, she is trying to manage feelings associated with her older son's mental health struggles. Revised goal date is 12-24.  Patient agrees to a video  session and is aware of the limitations of this platform. She is at home and I am in office.   [name] says that the medication is not working and she still has the infection. She is feeling very depressed and has no physical or emotional energy. She denies feeling suicidal, but just has no drive or motivation. Says today is [name]'s 43rd birthday and it makes her really sad because she thinks she will never see him again. She has not idea where he is and if he is okay. She feels guilt even though she knows she is not responsible for his situation. She is very gratified that [name] is doing well, but he "doesn't have time" for her. She talked about the need to start doing things to make her happy. Will contact [facility] and Plotnakov for a change in medication.                                                                                                                                                                                                               Tylin Stradley [name] Time: 4:15p-5:00p 45 minutes

## 2023-02-07 NOTE — Patient Outreach (Signed)
Care Coordination   02/07/2023 Name: [name] MRN: [id number] DOB: 14-Sep-1950   Care Coordination Outreach Attempts:  An unsuccessful telephone outreach was attempted for a scheduled appointment today.  Follow Up Plan:  Additional outreach attempts will be made to offer the patient care coordination information and services.   Encounter Outcome:  No Answer   Care Coordination Interventions:  No, not indicated    Kathyrn Sheriff, RN, MSN, BSN, CCM Care Management Coordinator [phone]

## 2023-02-08 DIAGNOSIS — D630 Anemia in neoplastic disease: Secondary | ICD-10-CM | POA: Diagnosis not present

## 2023-02-08 DIAGNOSIS — J4530 Mild persistent asthma, uncomplicated: Secondary | ICD-10-CM | POA: Diagnosis not present

## 2023-02-08 DIAGNOSIS — M797 Fibromyalgia: Secondary | ICD-10-CM | POA: Diagnosis not present

## 2023-02-08 DIAGNOSIS — I951 Orthostatic hypotension: Secondary | ICD-10-CM | POA: Diagnosis not present

## 2023-02-08 DIAGNOSIS — N1832 Chronic kidney disease, stage 3b: Secondary | ICD-10-CM | POA: Diagnosis not present

## 2023-02-08 DIAGNOSIS — D509 Iron deficiency anemia, unspecified: Secondary | ICD-10-CM | POA: Diagnosis not present

## 2023-02-08 DIAGNOSIS — N200 Calculus of kidney: Secondary | ICD-10-CM | POA: Diagnosis not present

## 2023-02-08 DIAGNOSIS — G43909 Migraine, unspecified, not intractable, without status migrainosus: Secondary | ICD-10-CM | POA: Diagnosis not present

## 2023-02-08 DIAGNOSIS — M5030 Other cervical disc degeneration, unspecified cervical region: Secondary | ICD-10-CM | POA: Diagnosis not present

## 2023-02-08 DIAGNOSIS — M1712 Unilateral primary osteoarthritis, left knee: Secondary | ICD-10-CM | POA: Diagnosis not present

## 2023-02-08 DIAGNOSIS — D631 Anemia in chronic kidney disease: Secondary | ICD-10-CM | POA: Diagnosis not present

## 2023-02-08 DIAGNOSIS — M47812 Spondylosis without myelopathy or radiculopathy, cervical region: Secondary | ICD-10-CM | POA: Diagnosis not present

## 2023-02-08 DIAGNOSIS — Z96641 Presence of right artificial hip joint: Secondary | ICD-10-CM | POA: Diagnosis not present

## 2023-02-08 DIAGNOSIS — I129 Hypertensive chronic kidney disease with stage 1 through stage 4 chronic kidney disease, or unspecified chronic kidney disease: Secondary | ICD-10-CM | POA: Diagnosis not present

## 2023-02-08 DIAGNOSIS — K589 Irritable bowel syndrome without diarrhea: Secondary | ICD-10-CM | POA: Diagnosis not present

## 2023-02-08 DIAGNOSIS — I700 Atherosclerosis of aorta: Secondary | ICD-10-CM | POA: Diagnosis not present

## 2023-02-08 DIAGNOSIS — E213 Hyperparathyroidism, unspecified: Secondary | ICD-10-CM | POA: Diagnosis not present

## 2023-02-08 DIAGNOSIS — G8929 Other chronic pain: Secondary | ICD-10-CM | POA: Diagnosis not present

## 2023-02-08 DIAGNOSIS — Z471 Aftercare following joint replacement surgery: Secondary | ICD-10-CM | POA: Diagnosis not present

## 2023-02-08 DIAGNOSIS — E559 Vitamin D deficiency, unspecified: Secondary | ICD-10-CM | POA: Diagnosis not present

## 2023-02-09 DIAGNOSIS — G8929 Other chronic pain: Secondary | ICD-10-CM | POA: Diagnosis not present

## 2023-02-09 DIAGNOSIS — I129 Hypertensive chronic kidney disease with stage 1 through stage 4 chronic kidney disease, or unspecified chronic kidney disease: Secondary | ICD-10-CM | POA: Diagnosis not present

## 2023-02-09 DIAGNOSIS — D259 Leiomyoma of uterus, unspecified: Secondary | ICD-10-CM | POA: Diagnosis not present

## 2023-02-09 DIAGNOSIS — D630 Anemia in neoplastic disease: Secondary | ICD-10-CM | POA: Diagnosis not present

## 2023-02-09 DIAGNOSIS — N1832 Chronic kidney disease, stage 3b: Secondary | ICD-10-CM | POA: Diagnosis not present

## 2023-02-09 DIAGNOSIS — M47812 Spondylosis without myelopathy or radiculopathy, cervical region: Secondary | ICD-10-CM | POA: Diagnosis not present

## 2023-02-09 DIAGNOSIS — Z471 Aftercare following joint replacement surgery: Secondary | ICD-10-CM | POA: Diagnosis not present

## 2023-02-09 DIAGNOSIS — Z96641 Presence of right artificial hip joint: Secondary | ICD-10-CM | POA: Diagnosis not present

## 2023-02-09 DIAGNOSIS — M797 Fibromyalgia: Secondary | ICD-10-CM | POA: Diagnosis not present

## 2023-02-09 DIAGNOSIS — M5030 Other cervical disc degeneration, unspecified cervical region: Secondary | ICD-10-CM | POA: Diagnosis not present

## 2023-02-09 DIAGNOSIS — M1712 Unilateral primary osteoarthritis, left knee: Secondary | ICD-10-CM | POA: Diagnosis not present

## 2023-02-09 DIAGNOSIS — D631 Anemia in chronic kidney disease: Secondary | ICD-10-CM | POA: Diagnosis not present

## 2023-02-10 ENCOUNTER — Telehealth: Payer: Self-pay | Admitting: *Deleted

## 2023-02-10 NOTE — Progress Notes (Signed)
Care Coordination Note  02/10/2023 Name: [name] MRN: [id number] DOB: 06/14/1950  [name] is a 72 y.o. year old female who is a primary care patient of Plotnikov, Georgina Quint, MD and is actively engaged with the care management team. I reached out to [name] by phone today to assist with re-scheduling a follow up visit with the RN Case Manager  Follow up plan: Unsuccessful telephone outreach attempt made. A HIPAA compliant phone message was left for the patient providing contact information and requesting a return call.   Burman Nieves, CCMA Care Coordination Care Guide Direct Dial: [phone]

## 2023-02-14 ENCOUNTER — Other Ambulatory Visit (HOSPITAL_COMMUNITY): Payer: Self-pay

## 2023-02-14 ENCOUNTER — Other Ambulatory Visit: Payer: Self-pay

## 2023-02-14 DIAGNOSIS — M1712 Unilateral primary osteoarthritis, left knee: Secondary | ICD-10-CM | POA: Diagnosis not present

## 2023-02-14 DIAGNOSIS — M47812 Spondylosis without myelopathy or radiculopathy, cervical region: Secondary | ICD-10-CM | POA: Diagnosis not present

## 2023-02-14 DIAGNOSIS — E559 Vitamin D deficiency, unspecified: Secondary | ICD-10-CM | POA: Diagnosis not present

## 2023-02-14 DIAGNOSIS — N1832 Chronic kidney disease, stage 3b: Secondary | ICD-10-CM | POA: Diagnosis not present

## 2023-02-14 DIAGNOSIS — D631 Anemia in chronic kidney disease: Secondary | ICD-10-CM | POA: Diagnosis not present

## 2023-02-14 DIAGNOSIS — D630 Anemia in neoplastic disease: Secondary | ICD-10-CM | POA: Diagnosis not present

## 2023-02-14 DIAGNOSIS — K589 Irritable bowel syndrome without diarrhea: Secondary | ICD-10-CM | POA: Diagnosis not present

## 2023-02-14 DIAGNOSIS — D509 Iron deficiency anemia, unspecified: Secondary | ICD-10-CM | POA: Diagnosis not present

## 2023-02-14 DIAGNOSIS — M797 Fibromyalgia: Secondary | ICD-10-CM | POA: Diagnosis not present

## 2023-02-14 DIAGNOSIS — N200 Calculus of kidney: Secondary | ICD-10-CM | POA: Diagnosis not present

## 2023-02-14 DIAGNOSIS — Z96641 Presence of right artificial hip joint: Secondary | ICD-10-CM | POA: Diagnosis not present

## 2023-02-14 DIAGNOSIS — J4530 Mild persistent asthma, uncomplicated: Secondary | ICD-10-CM | POA: Diagnosis not present

## 2023-02-14 DIAGNOSIS — I700 Atherosclerosis of aorta: Secondary | ICD-10-CM | POA: Diagnosis not present

## 2023-02-14 DIAGNOSIS — E213 Hyperparathyroidism, unspecified: Secondary | ICD-10-CM | POA: Diagnosis not present

## 2023-02-14 DIAGNOSIS — Z471 Aftercare following joint replacement surgery: Secondary | ICD-10-CM | POA: Diagnosis not present

## 2023-02-14 DIAGNOSIS — G43909 Migraine, unspecified, not intractable, without status migrainosus: Secondary | ICD-10-CM | POA: Diagnosis not present

## 2023-02-14 DIAGNOSIS — M5030 Other cervical disc degeneration, unspecified cervical region: Secondary | ICD-10-CM | POA: Diagnosis not present

## 2023-02-14 DIAGNOSIS — G8929 Other chronic pain: Secondary | ICD-10-CM | POA: Diagnosis not present

## 2023-02-14 DIAGNOSIS — I951 Orthostatic hypotension: Secondary | ICD-10-CM | POA: Diagnosis not present

## 2023-02-14 DIAGNOSIS — I129 Hypertensive chronic kidney disease with stage 1 through stage 4 chronic kidney disease, or unspecified chronic kidney disease: Secondary | ICD-10-CM | POA: Diagnosis not present

## 2023-02-14 NOTE — Progress Notes (Signed)
Care Coordination Note  02/14/2023 Name: SAMARIYAH MARTINS MRN: [id number] DOB: 11-Apr-1951  Luanna Cole Case is a 72 y.o. year old female who is a primary care patient of Plotnikov, Georgina Quint, MD and is actively engaged with the care management team. I reached out to [name] by phone today to assist with re-scheduling a follow up visit with the RN Case Manager  Follow up plan: Telephone appointment with care management team member scheduled for: 02/23/2023  Burman Nieves, [facility] Care Coordination Care Guide Direct Dial: [phone]

## 2023-02-15 ENCOUNTER — Other Ambulatory Visit: Payer: Self-pay

## 2023-02-15 ENCOUNTER — Telehealth: Payer: Self-pay | Admitting: Pharmacist

## 2023-02-15 ENCOUNTER — Other Ambulatory Visit (HOSPITAL_COMMUNITY): Payer: Self-pay

## 2023-02-15 NOTE — Telephone Encounter (Signed)
Received a forwarded message from [place] stating the patient had called asking about her medications being delivered from [facility] as she is running low on medication.   Called patient back and left detailed voicemail. Myself and [facility] were trying to reach patient a couple weeks ago regarding her medications however were unable to reach despite several attempts by phone and MyChart. Will send another MyChart message as well.  Arbutus Leas, PharmD, BCPS [facility] [phone]

## 2023-02-16 ENCOUNTER — Other Ambulatory Visit (HOSPITAL_COMMUNITY): Payer: Self-pay

## 2023-02-16 ENCOUNTER — Other Ambulatory Visit: Payer: Self-pay

## 2023-02-16 ENCOUNTER — Ambulatory Visit: Payer: 59 | Admitting: Psychology

## 2023-02-17 ENCOUNTER — Other Ambulatory Visit: Payer: Self-pay

## 2023-02-21 ENCOUNTER — Ambulatory Visit: Payer: 59 | Admitting: Psychology

## 2023-02-21 DIAGNOSIS — E559 Vitamin D deficiency, unspecified: Secondary | ICD-10-CM | POA: Diagnosis not present

## 2023-02-21 DIAGNOSIS — Z96641 Presence of right artificial hip joint: Secondary | ICD-10-CM | POA: Diagnosis not present

## 2023-02-21 DIAGNOSIS — M5030 Other cervical disc degeneration, unspecified cervical region: Secondary | ICD-10-CM | POA: Diagnosis not present

## 2023-02-21 DIAGNOSIS — K589 Irritable bowel syndrome without diarrhea: Secondary | ICD-10-CM | POA: Diagnosis not present

## 2023-02-21 DIAGNOSIS — G43909 Migraine, unspecified, not intractable, without status migrainosus: Secondary | ICD-10-CM | POA: Diagnosis not present

## 2023-02-21 DIAGNOSIS — G8929 Other chronic pain: Secondary | ICD-10-CM | POA: Diagnosis not present

## 2023-02-21 DIAGNOSIS — E213 Hyperparathyroidism, unspecified: Secondary | ICD-10-CM | POA: Diagnosis not present

## 2023-02-21 DIAGNOSIS — Z471 Aftercare following joint replacement surgery: Secondary | ICD-10-CM | POA: Diagnosis not present

## 2023-02-21 DIAGNOSIS — D630 Anemia in neoplastic disease: Secondary | ICD-10-CM | POA: Diagnosis not present

## 2023-02-21 DIAGNOSIS — N1832 Chronic kidney disease, stage 3b: Secondary | ICD-10-CM | POA: Diagnosis not present

## 2023-02-21 DIAGNOSIS — I700 Atherosclerosis of aorta: Secondary | ICD-10-CM | POA: Diagnosis not present

## 2023-02-21 DIAGNOSIS — N200 Calculus of kidney: Secondary | ICD-10-CM | POA: Diagnosis not present

## 2023-02-21 DIAGNOSIS — J4530 Mild persistent asthma, uncomplicated: Secondary | ICD-10-CM | POA: Diagnosis not present

## 2023-02-21 DIAGNOSIS — D631 Anemia in chronic kidney disease: Secondary | ICD-10-CM | POA: Diagnosis not present

## 2023-02-21 DIAGNOSIS — I129 Hypertensive chronic kidney disease with stage 1 through stage 4 chronic kidney disease, or unspecified chronic kidney disease: Secondary | ICD-10-CM | POA: Diagnosis not present

## 2023-02-21 DIAGNOSIS — M797 Fibromyalgia: Secondary | ICD-10-CM | POA: Diagnosis not present

## 2023-02-21 DIAGNOSIS — D509 Iron deficiency anemia, unspecified: Secondary | ICD-10-CM | POA: Diagnosis not present

## 2023-02-21 DIAGNOSIS — I951 Orthostatic hypotension: Secondary | ICD-10-CM | POA: Diagnosis not present

## 2023-02-21 DIAGNOSIS — M1712 Unilateral primary osteoarthritis, left knee: Secondary | ICD-10-CM | POA: Diagnosis not present

## 2023-02-21 DIAGNOSIS — M47812 Spondylosis without myelopathy or radiculopathy, cervical region: Secondary | ICD-10-CM | POA: Diagnosis not present

## 2023-02-22 DIAGNOSIS — Z96641 Presence of right artificial hip joint: Secondary | ICD-10-CM | POA: Diagnosis not present

## 2023-02-22 DIAGNOSIS — Z471 Aftercare following joint replacement surgery: Secondary | ICD-10-CM | POA: Diagnosis not present

## 2023-02-23 ENCOUNTER — Ambulatory Visit: Payer: Self-pay

## 2023-02-23 NOTE — Patient Instructions (Signed)
Visit Information  Thank you for taking time to visit with me today. Please don't hesitate to contact me if I can be of assistance to you.   Following are the goals we discussed today:  Continue to take medications as prescribed. Continue to attend provider visits as scheduled.  Continue to eat healthy, lean meats, vegetables, fruits, avoid saturated and transfats Continue to be mindful of fall prevention strategies Continue to contact provider with health questions or concerns.   Our next appointment is by telephone on 03/08/23 at 2:30 pm  Please call the care guide team at [phone] if you need to cancel or reschedule your appointment.   If you are experiencing a Mental Health or Behavioral Health Crisis or need someone to talk to, please call the Suicide and Crisis Lifeline: 988 call the [place] National Suicide Prevention Lifeline: 623 337 6692 or TTY: 346-230-2259 TTY 218-504-4167) to talk to a trained counselor call 1-800-273-TALK (toll free, 24 hour hotline)   Kathyrn Sheriff, RN, MSN, BSN, CCM Care Management Coordinator [phone]

## 2023-02-23 NOTE — Patient Outreach (Signed)
Care Coordination   Follow Up Visit Note   02/23/2023 Name: [name] MRN: [id number] DOB: 02-27-1951  [name] is a 72 y.o. year old female who sees Plotnikov, Georgina Quint, MD for primary care. I spoke with  [name] by phone today.  What matters to the patients health and wellness today?  Hip arthroplasty 01/13/23-01/14/23. Admitted on 01/18/23-01/20/23 with syncope/anemia. ED visit 01/31/23 for weakness-Per patient treated for UTI. [name] expresses concern that she continues to feel weak. She reports attended follow up visit with orthopedic provider today. Patient reports she is able to restart physical therapy sessions and will discuss with primary care provider. Patient reports medications recently changed to mail delivery/blister packaging. Last office visit with PCP 11/30/22. Video visit completed on 01/28/23 deconditioning/poor balance. Patient expresses concern re: weakness, dizziness and would like recheck for UTI and Hgb. Assisted patient with obtaining a Follow up appointment with primary care provider scheduled for 02/24/23 at 4 pm. Patient states she has transportation to appointment and will also discuss referral for physical therapy.  Goals Addressed             This Visit's Progress    Assist with Health managment       Interventions Today    Flowsheet Row Most Recent Value  Chronic Disease   Chronic disease during today's visit Other, Chronic Kidney Disease/End Stage Renal Disease (ESRD)  [post right total hip arthroplasty]  General Interventions   General Interventions Discussed/Reviewed General Interventions Reviewed, Doctor Visits  [Evaluation of current treatment plan for health condition and patient's adherence to plan.]  Doctor Visits Discussed/Reviewed Doctor Visits Discussed, Doctor Visits Reviewed, PCP, Specialist  PCP/Specialist Visits Compliance with follow-up visit  [reviewed upcoming provider appointments. assisted patient  with scheduling follow up appoinment through care guide]  Education Interventions   Education Provided Provided Education  Provided Verbal Education On Nutrition, Medication, When to see the doctor, Other, Mental Health/Coping with Illness  [active listening, advised to continue to take medications, attend provider appointment as scheduled, contact provider with health questions or concerns]  Nutrition Interventions   Nutrition Discussed/Reviewed Nutrition Discussed  Pharmacy Interventions   Pharmacy Dicussed/Reviewed Pharmacy Topics Discussed  Safety Interventions   Safety Discussed/Reviewed Fall Risk, Safety Discussed  Home Safety Contact provider for referral to PT/OT  [update primary care provider on patient status and planned visit for 02/24/23]            SDOH assessments and interventions completed:  No  Care Coordination Interventions:  Yes, provided   Follow up plan: Follow up call scheduled for 03/08/23    Encounter Outcome:  Patient Visit Completed   Kathyrn Sheriff, RN, MSN, BSN, CCM Care Management Coordinator [phone]

## 2023-02-24 ENCOUNTER — Ambulatory Visit: Payer: 59 | Admitting: Internal Medicine

## 2023-02-28 ENCOUNTER — Ambulatory Visit: Payer: 59 | Admitting: Internal Medicine

## 2023-03-01 ENCOUNTER — Other Ambulatory Visit: Payer: Self-pay | Admitting: Cardiology

## 2023-03-01 DIAGNOSIS — E213 Hyperparathyroidism, unspecified: Secondary | ICD-10-CM | POA: Diagnosis not present

## 2023-03-01 DIAGNOSIS — I129 Hypertensive chronic kidney disease with stage 1 through stage 4 chronic kidney disease, or unspecified chronic kidney disease: Secondary | ICD-10-CM | POA: Diagnosis not present

## 2023-03-01 DIAGNOSIS — E559 Vitamin D deficiency, unspecified: Secondary | ICD-10-CM | POA: Diagnosis not present

## 2023-03-01 DIAGNOSIS — D509 Iron deficiency anemia, unspecified: Secondary | ICD-10-CM | POA: Diagnosis not present

## 2023-03-01 DIAGNOSIS — G43909 Migraine, unspecified, not intractable, without status migrainosus: Secondary | ICD-10-CM | POA: Diagnosis not present

## 2023-03-01 DIAGNOSIS — M5030 Other cervical disc degeneration, unspecified cervical region: Secondary | ICD-10-CM | POA: Diagnosis not present

## 2023-03-01 DIAGNOSIS — J4530 Mild persistent asthma, uncomplicated: Secondary | ICD-10-CM | POA: Diagnosis not present

## 2023-03-01 DIAGNOSIS — M47812 Spondylosis without myelopathy or radiculopathy, cervical region: Secondary | ICD-10-CM | POA: Diagnosis not present

## 2023-03-01 DIAGNOSIS — I951 Orthostatic hypotension: Secondary | ICD-10-CM | POA: Diagnosis not present

## 2023-03-01 DIAGNOSIS — M797 Fibromyalgia: Secondary | ICD-10-CM | POA: Diagnosis not present

## 2023-03-01 DIAGNOSIS — D630 Anemia in neoplastic disease: Secondary | ICD-10-CM | POA: Diagnosis not present

## 2023-03-01 DIAGNOSIS — Z471 Aftercare following joint replacement surgery: Secondary | ICD-10-CM | POA: Diagnosis not present

## 2023-03-01 DIAGNOSIS — M1712 Unilateral primary osteoarthritis, left knee: Secondary | ICD-10-CM | POA: Diagnosis not present

## 2023-03-01 DIAGNOSIS — Z79899 Other long term (current) drug therapy: Secondary | ICD-10-CM

## 2023-03-01 DIAGNOSIS — I700 Atherosclerosis of aorta: Secondary | ICD-10-CM | POA: Diagnosis not present

## 2023-03-01 DIAGNOSIS — I10 Essential (primary) hypertension: Secondary | ICD-10-CM

## 2023-03-01 DIAGNOSIS — D631 Anemia in chronic kidney disease: Secondary | ICD-10-CM | POA: Diagnosis not present

## 2023-03-01 DIAGNOSIS — N200 Calculus of kidney: Secondary | ICD-10-CM | POA: Diagnosis not present

## 2023-03-01 DIAGNOSIS — K589 Irritable bowel syndrome without diarrhea: Secondary | ICD-10-CM | POA: Diagnosis not present

## 2023-03-01 DIAGNOSIS — G8929 Other chronic pain: Secondary | ICD-10-CM | POA: Diagnosis not present

## 2023-03-01 DIAGNOSIS — Z96641 Presence of right artificial hip joint: Secondary | ICD-10-CM | POA: Diagnosis not present

## 2023-03-01 DIAGNOSIS — N1832 Chronic kidney disease, stage 3b: Secondary | ICD-10-CM | POA: Diagnosis not present

## 2023-03-02 ENCOUNTER — Ambulatory Visit: Payer: 59 | Admitting: Psychology

## 2023-03-02 DIAGNOSIS — F4323 Adjustment disorder with mixed anxiety and depressed mood: Secondary | ICD-10-CM

## 2023-03-02 DIAGNOSIS — F4321 Adjustment disorder with depressed mood: Secondary | ICD-10-CM | POA: Diagnosis not present

## 2023-03-02 NOTE — Progress Notes (Addendum)
[name]   [name] is a 72 y.o. 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  Symptoms A diagnosis of an acute, serious illness that is life-threatening. (Status: maintained) -- No Description Entered  Diminished interest in or enjoyment of activities. (Status: maintained) -- No Description Entered  Lack of energy. (Status: maintained) -- No Description Entered  Sad affect, social withdrawal, anxiety, loss of interest in activities, and low energy. (Status: maintained) -- No Description Entered  Medication Status compliance  Safety unspecified If Suicidal or Homicidal State Action Taken: unspecified  Current Risk:  Medications Buspar (Dosage: 30mg  2X/day)  New Medication (Dosage: 250mg  2x/day)  New Medication (Dosage: 250mg  2x/day)  Prozac (Dosage: 40mg  daily)  Remeron (Dosage: 30mg  nightly)  xanax (Dosage: .5mg  2-3X/day)  Objectives Related Problem: Recognize, accept, and cope with feelings of depression. Description: Identify and replace thoughts and beliefs that support depression. Target Date: 2023-04-29 Frequency: Daily Modality: individual Progress: 75%  Related Problem: Recognize, accept, and cope with feelings of depression. Description: Verbalize an understanding of healthy and unhealthy emotions with the intent of increasing the use of healthy emotions to guide actions. Target Date: 2023-04-29 Frequency: Daily Modality: individual Progress: 85%  Related Problem: Recognize, accept, and cope with feelings of depression. Description: Increasingly verbalize hopeful and positive statements regarding self, others, and the future. Target Date: 2022-04-28 Frequency: Daily Modality: individual Progress: 100%  Related Problem: Reduce fear, anxiety, and worry associated with the medical condition. Description: Share with significant  others efforts to adapt successfully to the medical condition/diagnosis. Target Date: 2023-04-29 Frequency: Daily Modality: individual Progress: 85%  Related Problem: Reduce fear, anxiety, and worry associated with the medical condition. Description: Engage in social, productive, and recreational activities that are possible in spite of medical condition. Target Date: 2023-04-29 Frequency: Daily Modality: individual Progress: 90%  Related Problem: Reduce fear, anxiety, and worry associated with the medical condition. Description: Identify the coping skills and sources of emotional support that have been beneficial in the past. Target Date: 2023-04-29 Frequency: Daily Modality: individual Progress: 80%  Client Response full compliance  Service Location Location, [address] [place], [place] [address]  Service Code cpt [phone]  Related past to present  Facilitate problem solving  Identify/label emotions  Validate/empathize  Self care activities  Lifestyle change (exercise, nutrition)  Assess/facilitate readiness to change  Relaxation training  Rationally challenge thoughts or beliefs/cognitive restructuring  Comments  Dx: F43.21  Meds: Prozac, [name], patient is struggling to rebuild her life and adjust to her new circumstances. She suffers a negative self-concept and has little confidence in her judgement. She is seeking to reduce symptoms of anxiety and self-doubt and to re-define satisfa ction moving forward. Therapy will focus on cognitive restructuring to redefine her experiences and focus on those satisfying aspects of her life. Will target this goal completion for April1, 2021 (completed). She is also wanting to utilize counseling to adjust and accept her latest diagnosis of cancer. She wants assistance to better manage this news and remain positive throughout treatment. Goal date is 12-22. Patient has made  progress, but her medical condition is variable and she requests additional treatment to manage moods throughout her medical ordeal. In addition, she is trying to manage feelings associated with her older son's mental health struggles.  Revised goal date is 12-24.   Patient agrees to a video session and is aware of the limitations of this platform. She is at home and I am in office.   [name] says she and her son went to see her mother and it was a great visit. These visits are exhausting because she "is not active enough". Her hip is doing much better and she is more encouraged. She talked about her pride in her son and his accomplishments. She reflected on the history of her marriage to [name] and his involvement in rental homes. She also recalled the division of the family and the negative impact on her and her boys. She does feel that she has "landed in a good place" and has a good perspective of what has happened. She knows life could have been better, but says she is not bitter. Her biggest stress at this point is her son [name] and her lack of contact or information about him. Her moods are stable and she is working on self-care.                                                                                                                                                                                                                  [name] Time: 3:15p-4:00p 45 minutes

## 2023-03-03 ENCOUNTER — Other Ambulatory Visit: Payer: Self-pay

## 2023-03-03 ENCOUNTER — Ambulatory Visit: Payer: Self-pay

## 2023-03-03 ENCOUNTER — Other Ambulatory Visit (HOSPITAL_COMMUNITY): Payer: Self-pay

## 2023-03-03 ENCOUNTER — Other Ambulatory Visit: Payer: Self-pay | Admitting: Cardiology

## 2023-03-03 NOTE — Patient Instructions (Addendum)
Visit Information  Thank you for taking time to visit with me today. Please don't hesitate to contact me if I can be of assistance to you.   Following are the goals we discussed today:  Clinical Pharmacist with [facility] office: [place], [place] [phone] [facility] [phone]  Attend provider visits as scheduled Take medication as prescribed Contact provider with health questions or concerns as needed.   Our next appointment is by telephone on 04/04/23 at 2:30 pm  Please call the care guide team at [phone] if you need to cancel or reschedule your appointment.   If you are experiencing a Mental Health or Behavioral Health Crisis or need someone to talk to, please call the Suicide and Crisis Lifeline: 988 call the [place] National Suicide Prevention Lifeline: 918-049-0615 or TTY: 3174936211 TTY [phone]) to talk to a trained counselor call 1-800-273-TALK (toll free, 24 hour hotline)   Kathyrn Sheriff, RN, MSN, BSN, CCM Care Management Coordinator [phone]

## 2023-03-03 NOTE — Patient Outreach (Signed)
Care Coordination   Follow Up Visit Note   03/03/2023 Name: [name] MRN: [id number] DOB: February 09, 1951  [name] is a 72 y.o. year old female who sees Plotnikov, Georgina Quint, MD for primary care. I spoke with  [name] by phone today.  What matters to the patients health and wellness today?  Primary concern today is obtaining all her medications. She report she has obtained mail order medications from [facility], but has not obtained them all. Per review of chart, [facility] pharmacy and clinical pharmacist has outreached to patient unsuccessfully regarding medications.    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]  General Interventions   General Interventions Discussed/Reviewed General Interventions Reviewed  [Evaluation of current treatment plan for health condition and patient's adherence to plan.]  Pharmacy Interventions   Pharmacy Dicussed/Reviewed Pharmacy Topics Discussed  [Provided contact number for [facility] pharmacy [phone]) and clinical pharmacist [name] [phone]) encouraged patient to contact regarding medication needs or questions.]  Safety Interventions   Safety Discussed/Reviewed Safety Reviewed            SDOH assessments and interventions completed:  No  Care Coordination Interventions:  Yes, provided   Follow up plan: Follow up call scheduled for 04/04/23    Encounter Outcome:  Patient Visit Completed   Kathyrn Sheriff, RN, MSN, BSN, CCM Care Management Coordinator [phone]

## 2023-03-07 ENCOUNTER — Ambulatory Visit: Payer: 59 | Admitting: Psychology

## 2023-03-07 ENCOUNTER — Telehealth: Payer: Self-pay

## 2023-03-07 NOTE — Telephone Encounter (Signed)
Transition Care Management Unsuccessful Follow-up Telephone Call  Date of discharge and from where:  [facility] 9/17  Attempts:  1st Attempt  Reason for unsuccessful TCM follow-up call:  No answer/busy   Lenard [place]  [facility], [facility] Guide, Phone: [phone] Website: [email or web address]

## 2023-03-08 ENCOUNTER — Telehealth: Payer: Self-pay

## 2023-03-08 ENCOUNTER — Ambulatory Visit: Payer: Self-pay

## 2023-03-08 ENCOUNTER — Encounter: Payer: Self-pay | Admitting: Internal Medicine

## 2023-03-08 NOTE — Progress Notes (Unsigned)
Subjective:    Patient ID: [name], female    DOB: Feb 04, 1951, 72 y.o.   MRN: [id number]      HPI [name] is here for No chief complaint on file.   Cc: hemoglobin levels, UA, cxr for cough, sob, weakness and loss of balance     Medications and allergies reviewed with patient and updated if appropriate.  Current Outpatient Medications on File Prior to Visit  Medication Sig Dispense Refill   ALPRAZolam (XANAX) 1 MG tablet TAKE 1 TABLET BY MOUTH 3 TIMES DAILY AS NEEDED FOR ANXIETY. 90 tablet 1   ascorbic acid (VITAMIN C) 500 MG tablet Take 1 tablet (500 mg total) by mouth daily. 100 tablet 3   busPIRone (BUSPAR) 30 MG tablet Take 1 tablet (30 mg total) by mouth 2 (two) times daily. 60 tablet 3   carvedilol (COREG) 12.5 MG tablet TAKE 1 TABLET (12.5MG  TOTAL) BY MOUTH TWICE A DAY WITH MEALS 180 tablet 1   Cholecalciferol (D3 5000) 125 MCG (5000 UT) capsule Take 1 capsule (5,000 Units total) by mouth daily. 100 capsule 3   ciprofloxacin (CIPRO) 250 MG tablet Take 1 tablet (250 mg total) by mouth 2 (two) times daily. (Patient not taking: Reported on 02/23/2023) 20 tablet 0   cloNIDine (CATAPRES) 0.2 MG tablet TAKE 1 TABLET BY MOUTH AT BEDTIME. 90 tablet 1   colesevelam (WELCHOL) 625 MG tablet Take 2 tablets (1,250 mg total) by mouth 2 (two) times daily with a meal. 120 tablet 11   cyanocobalamin (VITAMIN B12) 1000 MCG tablet Take 1 tablet (1,000 mcg total) by mouth daily. Take 1,000 mcg by mouth daily. 100 tablet 3   D-Mannose 500 MG CAPS Take 500 mg by mouth 2 (two) times daily. WITH CRANBERRY & DANDELION EXTRACT 180 capsule 3   esomeprazole (NEXIUM) 40 MG capsule Take 1 capsule (40 mg total) by mouth every morning. 30 capsule 3   FLUoxetine (PROZAC) 40 MG capsule Take 1 capsule (40 mg total) by mouth every morning. 30 capsule 11   hydrALAZINE (APRESOLINE) 10 MG tablet Take 2 tablets (20 mg total) by mouth in the morning, at noon, and at bedtime. 180 tablet 4    ipratropium-albuterol (DUONEB) 0.5-2.5 (3) MG/3ML SOLN Take 3 mLs by nebulization every 6 (six) hours as needed (Asthma).     iron polysaccharides (POLY-IRON 150) 150 MG capsule Take 1 capsule (150 mg total) by mouth daily. 90 capsule 3   Lactobacillus (ACIDOPHILUS) CAPS capsule Take 1 capsule by mouth daily. 100 capsule 3   levothyroxine (SYNTHROID) 100 MCG tablet Take 1 tablet (100 mcg total) by mouth daily before breakfast. 30 tablet 11   losartan (COZAAR) 50 MG tablet TAKE 1 TABLET BY MOUTH EVERY DAY 90 tablet 2   Metamucil Fiber CHEW Chew 2 tablets by mouth in the morning and at bedtime. 100 tablet 11   mirtazapine (REMERON) 30 MG tablet Take 1 tablet (30 mg total) by mouth at bedtime. 30 tablet 0   omega-3 acid ethyl esters (LOVAZA) 1 g capsule Take 2 capsules (2 g total) by mouth 2 (two) times daily. 120 capsule 11   ondansetron (ZOFRAN) 4 MG tablet Take 1 tablet (4 mg total) by mouth every 8 (eight) hours as needed for nausea or vomiting. 30 tablet 0   OVER THE COUNTER MEDICATION Take 1 capsule by mouth daily. 11 MUSHROOM BLEND     Polyethyl Glycol-Propyl Glycol (SYSTANE) 0.4-0.3 % SOLN Place 1 drop into both eyes 3 (three) times daily  as needed (dry eyes). Place 1 drop into both eyes 3 (three) times daily as needed (dry eyes). 3 mL 2   rosuvastatin (CRESTOR) 10 MG tablet Take 1 tablet (10 mg total) by mouth daily. 30 tablet 2   sodium chloride (OCEAN) 0.65 % SOLN nasal spray Place 1 spray into both nostrils as needed for congestion. 30 mL 1   [DISCONTINUED] famotidine (PEPCID) 40 MG tablet TAKE 1 TABLET BY MOUTH EVERY DAY IN THE EVENING (Patient not taking: No sig reported) 30 tablet 0   No current facility-administered medications on file prior to visit.    Review of Systems     Objective:  There were no vitals filed for this visit. BP Readings from Last 3 Encounters:  01/31/23 (!) 147/93  01/20/23 (!) 185/96  01/14/23 (!) 103/59   Wt Readings from Last 3 Encounters:  01/19/23  175 lb 14.8 oz (79.8 kg)  01/13/23 163 lb (73.9 kg)  01/03/23 163 lb (73.9 kg)   There is no height or weight on file to calculate BMI.    Physical Exam         Assessment & Plan:    See Problem List for Assessment and Plan of chronic medical problems.

## 2023-03-08 NOTE — Patient Instructions (Signed)
Visit Information  Thank you for taking time to visit with me today. Please don't hesitate to contact me if I can be of assistance to you.   Following are the goals we discussed today:  Continue to take medications as prescribed. Continue to attend provider visits as scheduled Continue to eat healthy, lean meats, vegetables, fruits, avoid saturated and transfats Contact Provider with health questions or concerns as needed   Our next appointment is by telephone on 04/04/23 at 2:30 pm  Please call the care guide team at [phone] if you need to cancel or reschedule your appointment.   If you are experiencing a Mental Health or Behavioral Health Crisis or need someone to talk to, please call the Suicide and Crisis Lifeline: 988 call the [place] National Suicide Prevention Lifeline: (937)348-9496 or TTY: 423-466-1222 TTY 224 331 3798) to talk to a trained counselor call 1-800-273-TALK (toll free, 24 hour hotline)  Kathyrn Sheriff, RN, MSN, BSN, CCM Care Management Coordinator [phone]

## 2023-03-08 NOTE — Telephone Encounter (Signed)
Transition Care Management Unsuccessful Follow-up Telephone Call  Date of discharge and from where:  [name] 9/17  Attempts:  2nd  Reason for unsuccessful TCM follow-up call:  No answer/busy   [name]   [facility], [name], Phone: [phone] Website: [name]

## 2023-03-08 NOTE — Patient Outreach (Signed)
Care Coordination   Follow Up Visit Note   03/08/2023 Name: [name] MRN: [id number] DOB: Nov 20, 1950  [name] is a 72 y.o. year old female who sees Plotnikov, Georgina Quint, MD for primary care. I spoke with  [name] by phone today.  What matters to the patients health and wellness today?  Ms. Anschutz reports she has contacted [facility] regarding mail order medications. She reports she is expecting them in the mail and has enough medications to last. She reports upcoming appointment with Dr. Lawerance Bach on tomorrow for follow up on continued weakness, possible UTI and home health therapy.  Goals Addressed             This Visit's Progress    Assist with Health management       Interventions Today    Flowsheet Row Most Recent Value  Chronic Disease   Chronic disease during today's visit Chronic Kidney Disease/End Stage Renal Disease (ESRD), Other  [post right total hip arthroplasty]  General Interventions   General Interventions Discussed/Reviewed General Interventions Reviewed  [Evaluation of current treatment plan for health condition and patient's adherence to plan.]  Doctor Visits Discussed/Reviewed Doctor Visits Reviewed  [appointment with primary provider office tomorrow. confirmed patient has transportation]  Education Interventions   Education Provided Provided Education  Provided Verbal Education On Medication, When to see the doctor, Other  [provided positive feedback regarding health managment. encouraged to continue to contact provider with health questions/concerns as needed, advised to take medications as needed, ]  Pharmacy Interventions   Pharmacy Dicussed/Reviewed Pharmacy Topics Reviewed  [confirmed she has made contact with pharmacy regarding medications and is awaiting supply to arrive in the mail. reports she has enough medications to last until mail supply arrives.]            SDOH assessments and interventions  completed:  No  Care Coordination Interventions:  Yes, provided   Follow up plan: Follow up call scheduled for 04/04/23    Encounter Outcome:  Patient Visit Completed   Kathyrn Sheriff, RN, MSN, BSN, CCM Care Management Coordinator [phone]

## 2023-03-09 ENCOUNTER — Encounter: Payer: Self-pay | Admitting: Internal Medicine

## 2023-03-09 ENCOUNTER — Other Ambulatory Visit (INDEPENDENT_AMBULATORY_CARE_PROVIDER_SITE_OTHER): Payer: 59

## 2023-03-09 ENCOUNTER — Ambulatory Visit: Payer: 59 | Admitting: Internal Medicine

## 2023-03-09 VITALS — BP 130/70 | HR 77 | Temp 97.9°F | Ht 61.0 in | Wt 179.0 lb

## 2023-03-09 DIAGNOSIS — M797 Fibromyalgia: Secondary | ICD-10-CM | POA: Diagnosis not present

## 2023-03-09 DIAGNOSIS — D649 Anemia, unspecified: Secondary | ICD-10-CM

## 2023-03-09 DIAGNOSIS — F332 Major depressive disorder, recurrent severe without psychotic features: Secondary | ICD-10-CM | POA: Diagnosis not present

## 2023-03-09 DIAGNOSIS — N183 Chronic kidney disease, stage 3 unspecified: Secondary | ICD-10-CM | POA: Diagnosis not present

## 2023-03-09 DIAGNOSIS — M1611 Unilateral primary osteoarthritis, right hip: Secondary | ICD-10-CM

## 2023-03-09 DIAGNOSIS — C55 Malignant neoplasm of uterus, part unspecified: Secondary | ICD-10-CM

## 2023-03-09 LAB — COMPREHENSIVE METABOLIC PANEL
ALT: 18 U/L (ref 0–35)
AST: 21 U/L (ref 0–37)
Albumin: 4.3 g/dL (ref 3.5–5.2)
Alkaline Phosphatase: 102 U/L (ref 39–117)
BUN: 15 mg/dL (ref 6–23)
CO2: 29 meq/L (ref 19–32)
Calcium: 9.4 mg/dL (ref 8.4–10.5)
Chloride: 103 meq/L (ref 96–112)
Creatinine, Ser: 1.23 mg/dL — ABNORMAL HIGH (ref 0.40–1.20)
GFR: 43.71 mL/min — ABNORMAL LOW (ref >60.00)
Glucose, Bld: 95 mg/dL (ref 70–99)
Potassium: 4 meq/L (ref 3.5–5.1)
Sodium: 138 meq/L (ref 135–145)
Total Bilirubin: 0.4 mg/dL (ref 0.2–1.2)
Total Protein: 7.1 g/dL (ref 6.0–8.3)

## 2023-03-09 LAB — CBC WITH DIFFERENTIAL/PLATELET
Basophils Absolute: 0.1 10*3/uL (ref 0.0–0.1)
Basophils Relative: 1.1 % (ref 0.0–3.0)
Eosinophils Absolute: 0.2 10*3/uL (ref 0.0–0.7)
Eosinophils Relative: 3 % (ref 0.0–5.0)
HCT: 37.3 % (ref 36.0–46.0)
Hemoglobin: 12.4 g/dL (ref 12.0–15.0)
Lymphocytes Relative: 17.3 % (ref 12.0–46.0)
Lymphs Abs: 1.4 10*3/uL (ref 0.7–4.0)
MCHC: 33.2 g/dL (ref 30.0–36.0)
MCV: 94.3 fL (ref 78.0–100.0)
Monocytes Absolute: 0.6 10*3/uL (ref 0.1–1.0)
Monocytes Relative: 7 % (ref 3.0–12.0)
Neutro Abs: 5.7 10*3/uL (ref 1.4–7.7)
Neutrophils Relative %: 71.6 % (ref 43.0–77.0)
Platelets: 381 10*3/uL (ref 150.0–400.0)
RBC: 3.96 Mil/uL (ref 3.87–5.11)
RDW: 13.5 % (ref 11.5–15.5)
WBC: 8 10*3/uL (ref 4.0–10.5)

## 2023-03-09 LAB — SEDIMENTATION RATE: Sed Rate: 10 mm/h (ref 0–30)

## 2023-03-09 NOTE — Assessment & Plan Note (Signed)
F/u w/Dr Berline Lopes

## 2023-03-09 NOTE — Assessment & Plan Note (Signed)
Continue w/good hydration Monitor labs/GFR

## 2023-03-09 NOTE — Assessment & Plan Note (Signed)
Monitor CBC 

## 2023-03-09 NOTE — Progress Notes (Unsigned)
Subjective:  Patient ID: [name], female    DOB: 1951-02-22  Age: 72 y.o. MRN: [id number]  CC: Follow-up   HPI [name] presents for fatigue, depression, anxiety, fibromyalgia Status post right hip replacement by Dr. Linna Caprice  Outpatient Medications Prior to Visit  Medication Sig Dispense Refill   ALPRAZolam (XANAX) 1 MG tablet TAKE 1 TABLET BY MOUTH 3 TIMES DAILY AS NEEDED FOR ANXIETY. 90 tablet 1   ascorbic acid (VITAMIN C) 500 MG tablet Take 1 tablet (500 mg total) by mouth daily. 100 tablet 3   busPIRone (BUSPAR) 30 MG tablet Take 1 tablet (30 mg total) by mouth 2 (two) times daily. 60 tablet 3   carvedilol (COREG) 12.5 MG tablet TAKE 1 TABLET (12.5MG  TOTAL) BY MOUTH TWICE A DAY WITH MEALS 180 tablet 1   Cholecalciferol (D3 5000) 125 MCG (5000 UT) capsule Take 1 capsule (5,000 Units total) by mouth daily. 100 capsule 3   cloNIDine (CATAPRES) 0.2 MG tablet TAKE 1 TABLET BY MOUTH AT BEDTIME. 90 tablet 1   colesevelam (WELCHOL) 625 MG tablet Take 2 tablets (1,250 mg total) by mouth 2 (two) times daily with a meal. 120 tablet 11   cyanocobalamin (VITAMIN B12) 1000 MCG tablet Take 1 tablet (1,000 mcg total) by mouth daily. Take 1,000 mcg by mouth daily. 100 tablet 3   D-Mannose 500 MG CAPS Take 500 mg by mouth 2 (two) times daily. WITH CRANBERRY & DANDELION EXTRACT 180 capsule 3   esomeprazole (NEXIUM) 40 MG capsule Take 1 capsule (40 mg total) by mouth every morning. 30 capsule 3   FLUoxetine (PROZAC) 40 MG capsule Take 1 capsule (40 mg total) by mouth every morning. 30 capsule 11   hydrALAZINE (APRESOLINE) 10 MG tablet Take 2 tablets (20 mg total) by mouth in the morning, at noon, and at bedtime. 180 tablet 4   HYDROcodone-acetaminophen (NORCO/VICODIN) 5-325 MG tablet      ipratropium-albuterol (DUONEB) 0.5-2.5 (3) MG/3ML SOLN Take 3 mLs by nebulization every 6 (six) hours as needed (Asthma).     iron polysaccharides (POLY-IRON 150) 150 MG capsule Take 1 capsule  (150 mg total) by mouth daily. 90 capsule 3   Lactobacillus (ACIDOPHILUS) CAPS capsule Take 1 capsule by mouth daily. 100 capsule 3   levothyroxine (SYNTHROID) 100 MCG tablet Take 1 tablet (100 mcg total) by mouth daily before breakfast. 30 tablet 11   losartan (COZAAR) 50 MG tablet TAKE 1 TABLET BY MOUTH EVERY DAY 90 tablet 2   Metamucil Fiber CHEW Chew 2 tablets by mouth in the morning and at bedtime. 100 tablet 11   mirtazapine (REMERON) 30 MG tablet Take 1 tablet (30 mg total) by mouth at bedtime. 30 tablet 0   omega-3 acid ethyl esters (LOVAZA) 1 g capsule Take 2 capsules (2 g total) by mouth 2 (two) times daily. 120 capsule 11   ondansetron (ZOFRAN) 4 MG tablet Take 1 tablet (4 mg total) by mouth every 8 (eight) hours as needed for nausea or vomiting. 30 tablet 0   OVER THE COUNTER MEDICATION Take 1 capsule by mouth daily. 11 MUSHROOM BLEND     Polyethyl Glycol-Propyl Glycol (SYSTANE) 0.4-0.3 % SOLN Place 1 drop into both eyes 3 (three) times daily as needed (dry eyes). Place 1 drop into both eyes 3 (three) times daily as needed (dry eyes). 3 mL 2   rosuvastatin (CRESTOR) 10 MG tablet Take 1 tablet (10 mg total) by mouth daily. 30 tablet 2   sodium chloride (OCEAN)  0.65 % SOLN nasal spray Place 1 spray into both nostrils as needed for congestion. 30 mL 1   ciprofloxacin (CIPRO) 250 MG tablet Take 1 tablet (250 mg total) by mouth 2 (two) times daily. (Patient not taking: Reported on 02/23/2023) 20 tablet 0   No facility-administered medications prior to visit.    ROS: Review of Systems  Constitutional:  Positive for fatigue. Negative for activity change, appetite change, chills and unexpected weight change.  HENT:  Negative for congestion, mouth sores and sinus pressure.   Eyes:  Negative for visual disturbance.  Respiratory:  Negative for cough and chest tightness.   Gastrointestinal:  Negative for abdominal pain and nausea.  Genitourinary:  Negative for difficulty urinating, frequency  and vaginal pain.  Musculoskeletal:  Positive for arthralgias and gait problem. Negative for back pain.  Skin:  Negative for pallor and rash.  Neurological:  Positive for weakness. Negative for dizziness, tremors, numbness and headaches.  Psychiatric/Behavioral:  Positive for decreased concentration and dysphoric mood. Negative for confusion, sleep disturbance and suicidal ideas. The patient is nervous/anxious.     Objective:  BP 130/70 (BP Location: Left Arm, Patient Position: Sitting, Cuff Size: Normal)   Pulse 77   Temp 97.9 F (36.6 C) (Oral)   Ht 5\' 1"  (1.549 m)   Wt 179 lb (81.2 kg)   SpO2 96%   BMI 33.82 kg/m   BP Readings from Last 3 Encounters:  03/09/23 130/70  01/31/23 (!) 147/93  01/20/23 (!) 185/96    Wt Readings from Last 3 Encounters:  03/09/23 179 lb (81.2 kg)  01/19/23 175 lb 14.8 oz (79.8 kg)  01/13/23 163 lb (73.9 kg)    Physical Exam Constitutional:      General: She is not in acute distress.    Appearance: Normal appearance. She is well-developed. She is not ill-appearing.  HENT:     Head: Normocephalic.     Right Ear: External ear normal.     Left Ear: External ear normal.     Nose: Nose normal.  Eyes:     General:        Right eye: No discharge.        Left eye: No discharge.     Conjunctiva/sclera: Conjunctivae normal.     Pupils: Pupils are equal, round, and reactive to light.  Neck:     Thyroid: No thyromegaly.     Vascular: No JVD.     Trachea: No tracheal deviation.  Cardiovascular:     Rate and Rhythm: Normal rate and regular rhythm.     Heart sounds: Normal heart sounds.  Pulmonary:     Effort: No respiratory distress.     Breath sounds: No stridor. No wheezing.  Abdominal:     General: Bowel sounds are normal. There is no distension.     Palpations: Abdomen is soft. There is no mass.     Tenderness: There is no abdominal tenderness. There is no guarding or rebound.  Musculoskeletal:        General: Tenderness present.      Cervical back: Normal range of motion and neck supple. No rigidity.  Lymphadenopathy:     Cervical: No cervical adenopathy.  Skin:    Findings: No erythema or rash.  Neurological:     Cranial Nerves: No cranial nerve deficit.     Motor: No abnormal muscle tone.     Coordination: Coordination normal.     Deep Tendon Reflexes: Reflexes normal.  Psychiatric:  Behavior: Behavior normal.        Thought Content: Thought content normal.        Judgment: Judgment normal.   Ambulating slowly Keep scars healing well  Lab Results  Component Value Date   WBC 7.7 01/31/2023   HGB 11.8 (L) 01/31/2023   HCT 37.0 01/31/2023   PLT 421 (H) 01/31/2023   GLUCOSE 101 (H) 01/31/2023   CHOL 277 (H) 09/23/2022   TRIG 276 (H) 09/23/2022   HDL 51 09/23/2022   LDLDIRECT 160.0 10/18/2018   LDLCALC 174 (H) 09/23/2022   ALT 13 01/18/2023   AST 36 01/18/2023   NA 138 01/31/2023   K 4.2 01/31/2023   CL 106 01/31/2023   CREATININE 0.96 01/31/2023   BUN 17 01/31/2023   CO2 21 (L) 01/31/2023   TSH 1.37 12/21/2022   INR 1.0 07/15/2020   HGBA1C 5.8 03/01/2022   MICROALBUR 0.8 12/16/2021    No results found.  Assessment & Plan:   Problem List Items Addressed This Visit     Osteoarthritis of right hip (Chronic)    Status post right hip replacement by Dr. Linna Caprice.  Progressing well      Relevant Medications   HYDROcodone-acetaminophen (NORCO/VICODIN) 5-325 MG tablet   Leiomyosarcoma of uterus (HCC)    F/u w/Dr [name]      Relevant Orders   CBC with Differential/Platelet   Iron, TIBC and Ferritin Panel   Comprehensive metabolic panel   Urinalysis   T4, free   Sedimentation rate   TSH   Major depressive disorder, recurrent, severe without psychotic features (HCC)    Seeing Dr Donell Beers, Dr Dellia Cloud On Mirtazipine, Buspar, Fluoxetine, Xanax prn On Ritalin - it is helping      Fibromyalgia    Chronic Coping ok      Relevant Medications   HYDROcodone-acetaminophen  (NORCO/VICODIN) 5-325 MG tablet   Other Relevant Orders   CBC with Differential/Platelet   Iron, TIBC and Ferritin Panel   Comprehensive metabolic panel   Urinalysis   T4, free   Sedimentation rate   TSH   RESOLVED: CKD (chronic kidney disease) stage 3, GFR 30-59 ml/min (HCC)    Continue w/good hydration Monitor labs/GFR      Symptomatic anemia - Primary    Monitor CBC      Relevant Orders   CBC with Differential/Platelet   Iron, TIBC and Ferritin Panel   Comprehensive metabolic panel   Urinalysis   T4, free   Sedimentation rate   TSH      No orders of the defined types were placed in this encounter.     Follow-up: Return in about 3 months (around 06/09/2023) for a follow-up visit.  Sonda Primes, MD

## 2023-03-09 NOTE — Assessment & Plan Note (Signed)
Chronic Coping ok

## 2023-03-09 NOTE — Assessment & Plan Note (Signed)
Seeing Dr Donell Beers, Dr Dellia Cloud On Mirtazipine, Buspar, Fluoxetine, Xanax prn On Ritalin - it is helping

## 2023-03-10 ENCOUNTER — Encounter: Payer: Self-pay | Admitting: Internal Medicine

## 2023-03-10 ENCOUNTER — Other Ambulatory Visit: Payer: Self-pay

## 2023-03-10 ENCOUNTER — Other Ambulatory Visit (HOSPITAL_COMMUNITY): Payer: Self-pay

## 2023-03-10 ENCOUNTER — Other Ambulatory Visit: Payer: Self-pay | Admitting: Internal Medicine

## 2023-03-10 ENCOUNTER — Telehealth: Payer: Self-pay | Admitting: Internal Medicine

## 2023-03-10 LAB — URINALYSIS, ROUTINE W REFLEX MICROSCOPIC
Bilirubin Urine: NEGATIVE
Hgb urine dipstick: NEGATIVE
Ketones, ur: NEGATIVE
Nitrite: NEGATIVE
Specific Gravity, Urine: 1.01 (ref 1.000–1.030)
Total Protein, Urine: NEGATIVE
Urine Glucose: NEGATIVE
Urobilinogen, UA: 0.2 (ref 0.0–1.0)
pH: 6.5 (ref 5.0–8.0)

## 2023-03-10 LAB — T4, FREE: Free T4: 0.76 ng/dL (ref 0.60–1.60)

## 2023-03-10 LAB — TSH: TSH: 4.32 u[IU]/mL (ref 0.35–5.50)

## 2023-03-10 NOTE — Telephone Encounter (Signed)
Patient called and wants to know what is going to be called in for her uti - she also wants to resume physical therapy with [facility] - orders will need to be placed - please call patient and advise.

## 2023-03-10 NOTE — Assessment & Plan Note (Signed)
Status post right hip replacement by Dr. Linna Caprice.  Progressing well

## 2023-03-11 ENCOUNTER — Ambulatory Visit: Payer: 59 | Admitting: Psychology

## 2023-03-11 ENCOUNTER — Other Ambulatory Visit: Payer: Self-pay

## 2023-03-11 ENCOUNTER — Other Ambulatory Visit: Payer: Self-pay | Admitting: Internal Medicine

## 2023-03-11 ENCOUNTER — Other Ambulatory Visit (HOSPITAL_COMMUNITY): Payer: Self-pay

## 2023-03-11 DIAGNOSIS — F4321 Adjustment disorder with depressed mood: Secondary | ICD-10-CM | POA: Diagnosis not present

## 2023-03-11 DIAGNOSIS — F4323 Adjustment disorder with mixed anxiety and depressed mood: Secondary | ICD-10-CM

## 2023-03-11 LAB — IRON,TIBC AND FERRITIN PANEL
%SAT: 22 % (ref 16–45)
Ferritin: 172 ng/mL (ref 16–288)
Iron: 75 ug/dL (ref 45–160)
TIBC: 348 ug/dL (ref 250–450)

## 2023-03-11 MED ORDER — MIRTAZAPINE 30 MG PO TABS
30.0000 mg | ORAL_TABLET | Freq: Every day | ORAL | 5 refills | Status: DC
  Filled 2023-03-11 – 2023-03-17 (×2): qty 30, 30d supply, fill #0
  Filled 2023-04-18: qty 30, 30d supply, fill #1
  Filled 2023-05-04 – 2023-05-13 (×3): qty 30, 30d supply, fill #2
  Filled 2023-06-01 – 2023-06-15 (×5): qty 30, 30d supply, fill #3
  Filled 2023-06-27 – 2023-07-15 (×3): qty 30, 30d supply, fill #4
  Filled 2023-07-21 – 2023-08-08 (×3): qty 30, 30d supply, fill #5

## 2023-03-11 MED ORDER — CIPROFLOXACIN HCL 250 MG PO TABS
250.0000 mg | ORAL_TABLET | Freq: Two times a day (BID) | ORAL | 0 refills | Status: DC
  Filled 2023-03-11 (×2): qty 20, 10d supply, fill #0

## 2023-03-13 ENCOUNTER — Other Ambulatory Visit: Payer: Self-pay | Admitting: Internal Medicine

## 2023-03-13 DIAGNOSIS — R5381 Other malaise: Secondary | ICD-10-CM

## 2023-03-13 DIAGNOSIS — M1611 Unilateral primary osteoarthritis, right hip: Secondary | ICD-10-CM

## 2023-03-13 DIAGNOSIS — C499 Malignant neoplasm of connective and soft tissue, unspecified: Secondary | ICD-10-CM

## 2023-03-13 DIAGNOSIS — M797 Fibromyalgia: Secondary | ICD-10-CM

## 2023-03-14 ENCOUNTER — Other Ambulatory Visit: Payer: Self-pay | Admitting: Internal Medicine

## 2023-03-14 ENCOUNTER — Other Ambulatory Visit: Payer: Self-pay

## 2023-03-14 NOTE — Telephone Encounter (Signed)
Cipro was called in Referral was made Thank you

## 2023-03-15 ENCOUNTER — Ambulatory Visit: Payer: 59 | Admitting: Internal Medicine

## 2023-03-15 ENCOUNTER — Other Ambulatory Visit (HOSPITAL_COMMUNITY): Payer: Self-pay

## 2023-03-15 MED ORDER — DOCUSATE SODIUM 100 MG PO CAPS
200.0000 mg | ORAL_CAPSULE | Freq: Every day | ORAL | 11 refills | Status: DC
  Filled 2023-03-15 – 2023-03-17 (×2): qty 60, 30d supply, fill #0

## 2023-03-15 NOTE — Telephone Encounter (Signed)
LDVM for pt of Dr. Loren Racer reply.

## 2023-03-15 NOTE — Progress Notes (Addendum)
Jewel Venditto LEWIS Time: 4:10p-5:00 p50 minutes   MCKINLEY COLN is a 72 y.o. 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  Symptoms A diagnosis of an acute, serious illness that is life-threatening. (Status: maintained) -- No Description Entered  Diminished interest in or enjoyment of activities. (Status: maintained) -- No Description Entered  Lack of energy. (Status: maintained) -- No Description Entered  Sad affect, social withdrawal, anxiety, loss of interest in activities, and low energy. (Status: maintained) -- No Description Entered  Medication Status compliance  Safety unspecified If Suicidal or Homicidal State Action Taken: unspecified  Current Risk:  Medications Buspar (Dosage: 30mg  2X/day)  New Medication (Dosage: 250mg  2x/day)  New Medication (Dosage: 250mg  2x/day)  Prozac (Dosage: 40mg  daily)  Remeron (Dosage: 30mg  nightly)  xanax (Dosage: .5mg  2-3X/day)  Objectives Related Problem: Recognize, accept, and cope with feelings of depression. Description: Identify and replace thoughts and beliefs that support depression. Target Date: 2023-04-29 Frequency: Daily Modality: individual Progress: 75%  Related Problem: Recognize, accept, and cope with feelings of depression. Description: Verbalize an understanding of healthy and unhealthy emotions with the intent of increasing the use of healthy emotions to guide actions. Target Date: 2023-04-29 Frequency: Daily Modality: individual Progress: 85%  Related Problem: Recognize, accept, and cope with feelings of depression. Description: Increasingly verbalize hopeful and positive statements regarding self, others, and the future. Target Date: 2022-04-28 Frequency: Daily Modality: individual Progress: 100%  Related Problem: Reduce fear, anxiety, and worry associated with the medical  condition. Description: Share with significant others efforts to adapt successfully to the medical condition/diagnosis. Target Date: 2023-04-29 Frequency: Daily Modality: individual Progress: 85%  Related Problem: Reduce fear, anxiety, and worry associated with the medical condition. Description: Engage in social, productive, and recreational activities that are possible in spite of medical condition. Target Date: 2023-04-29 Frequency: Daily Modality: individual Progress: 90%  Related Problem: Reduce fear, anxiety, and worry associated with the medical condition. Description: Identify the coping skills and sources of emotional support that have been beneficial in the past. Target Date: 2023-04-29 Frequency: Daily Modality: individual Progress: 80%  Client Response full compliance  Service Location Location, [address] [place], [place] [address]  Service Code cpt [phone]  Related past to present  Facilitate problem solving  Identify/label emotions  Validate/empathize  Self care activities  Lifestyle change (exercise, nutrition)  Assess/facilitate readiness to change  Relaxation training  Rationally challenge thoughts or beliefs/cognitive restructuring  Comments  Dx: F43.21  Meds: Prozac, [name], patient is struggling to rebuild her life and adjust to her new circumstances. She suffers a negative self-concept and has little confidence in her judgement. She is seeking to reduce symptoms of anxiety and self-doubt and to re-define satisfa ction moving forward. Therapy will focus on cognitive restructuring to redefine her experiences and focus on those satisfying aspects of her life. Will target this goal completion for April1, 2021 (completed). She is also wanting to utilize counseling to adjust and accept her latest diagnosis of cancer. She wants assistance to better manage this news and remain positive throughout  treatment. Goal date is 12-22. Patient has made progress, but her medical condition is variable and she requests additional treatment to manage moods throughout her medical  ordeal. In addition, she is trying to manage feelings associated with her older son's mental health struggles. Revised goal date is 12-24.   Patient agrees to a video session and is aware of the limitations of this platform. She is at home and I am in office.   [name]: She reports that she saw Dr. Elson Areas this past week. She is basically doing better, but still has another UTI and needs medication. This is a chronic issue related to prior medical conditions. She is working to stay positive and focus primarily on gratitude for all she has. This is increasingly difficult, but she does manage to spend time each day reflecting on the positive aspects of her life. He depression, however, remains. We talked about making sure to bring this up with Dr. Donell Beers at her next appointment in 2 weeks to determine if she needs to change medications. Part of her challenge has been lack of mobility. Discussed ways for her to get out more and have more interpersonal interactions.                                                                                                                                                                                                                     Jakyrie Totherow LEWIS Time: 12:45p-1:30p 45 minutes

## 2023-03-16 ENCOUNTER — Other Ambulatory Visit: Payer: Self-pay

## 2023-03-16 ENCOUNTER — Ambulatory Visit: Payer: 59 | Admitting: Psychology

## 2023-03-16 ENCOUNTER — Other Ambulatory Visit (HOSPITAL_COMMUNITY): Payer: Self-pay

## 2023-03-16 DIAGNOSIS — F4321 Adjustment disorder with depressed mood: Secondary | ICD-10-CM

## 2023-03-16 DIAGNOSIS — F4323 Adjustment disorder with mixed anxiety and depressed mood: Secondary | ICD-10-CM

## 2023-03-16 NOTE — Progress Notes (Signed)
[name] Grant Time: 4:10p-5:00 p50 minutes   [name] is a 72 y.o. female patient  Date: 03/16/2023  Treatment Plan Diagnosis F43.21 (Adjustment Disorder with mixed anxiety and depressed mood.  Symptoms A diagnosis of an acute, serious illness that is life-threatening. (Status: maintained) -- No Description Entered  Diminished interest in or enjoyment of activities. (Status: maintained) -- No Description Entered  Lack of energy. (Status: maintained) -- No Description Entered  Sad affect, social withdrawal, anxiety, loss of interest in activities, and low energy. (Status: maintained) -- No Description Entered  Medication Status compliance  Safety unspecified If Suicidal or Homicidal State Action Taken: unspecified  Current Risk:  Medications Buspar (Dosage: 30mg  2X/day)  New Medication (Dosage: 250mg  2x/day)  New Medication (Dosage: 250mg  2x/day)  Prozac (Dosage: 40mg  daily)  Remeron (Dosage: 30mg  nightly)  xanax (Dosage: .5mg  2-3X/day)  Objectives Related Problem: Recognize, accept, and cope with feelings of depression. Description: Identify and replace thoughts and beliefs that support depression. Target Date: 2023-04-29 Frequency: Daily Modality: individual Progress: 75%  Related Problem: Recognize, accept, and cope with feelings of depression. Description: Verbalize an understanding of healthy and unhealthy emotions with the intent of increasing the use of healthy emotions to guide actions. Target Date: 2023-04-29 Frequency: Daily Modality: individual Progress: 85%  Related Problem: Recognize, accept, and cope with feelings of depression. Description: Increasingly verbalize hopeful and positive statements regarding self, others, and the future. Target Date: 2022-04-28 Frequency: Daily Modality: individual Progress: 100%  Related Problem: Reduce fear, anxiety, and worry associated with  the medical condition. Description: Share with significant others efforts to adapt successfully to the medical condition/diagnosis. Target Date: 2023-04-29 Frequency: Daily Modality: individual Progress: 85%  Related Problem: Reduce fear, anxiety, and worry associated with the medical condition. Description: Engage in social, productive, and recreational activities that are possible in spite of medical condition. Target Date: 2023-04-29 Frequency: Daily Modality: individual Progress: 90%  Related Problem: Reduce fear, anxiety, and worry associated with the medical condition. Description: Identify the coping skills and sources of emotional support that have been beneficial in the past. Target Date: 2023-04-29 Frequency: Daily Modality: individual Progress: 80%  Client Response full compliance  Service Location Location, [address] [place], [place] [address]  Service Code cpt [phone]  Related past to present  Facilitate problem solving  Identify/label emotions  Validate/empathize  Self care activities  Lifestyle change (exercise, nutrition)  Assess/facilitate readiness to change  Relaxation training  Rationally challenge thoughts or beliefs/cognitive restructuring  Comments  Dx: F43.21  Meds: Prozac, [name], patient is struggling to rebuild her life and adjust to her new circumstances. She suffers a negative self-concept and has little confidence in her judgement. She is seeking to reduce symptoms of anxiety and self-doubt and to re-define satisfa ction moving forward. Therapy will focus on cognitive restructuring to redefine her experiences and focus on those satisfying aspects of her life. Will target this goal completion for April1, 2021 (completed). She is also wanting to utilize counseling to adjust and accept her latest diagnosis of cancer. She wants assistance to better manage this news and remain positive  throughout treatment. Goal date is 12-22. Patient has made progress, but her medical condition is variable and she requests additional treatment to manage moods throughout  her medical ordeal. In addition, she is trying to manage feelings associated with her older son's mental health struggles. Revised goal date is 12-24.   Patient agrees to a video session and is aware of the limitations of this platform. She is at home and I am in office.   [name]: She states she is "getting beyond her expiration date". She says "I am not unhappy, but is disappointed about the mistakes I have made in my life". Feels she was so naive and she is still paying for it. She reflected back on her marriage with Jonny Ruiz and all the damage it did to her and her children. These thoughts were triggered by her son's upcoming ski trip to [place]. His father used to take big trips as well and this made her think about the past, and her regrets that she did not "demand" that Jonny Ruiz take their kids on some of the trips. She is also very anxious about her son's trip where he will ski for the first time. Encouraged her to trust her son's judgement. She is having trouble letting it go. Says she has resentment and anger. She is, however, feeling better physically.                                                                                                                                                                                                                          [name] Time: 3:20p-4:10p 50 minutes                 Charlotte Ridgel, PhD

## 2023-03-17 ENCOUNTER — Telehealth: Payer: Self-pay | Admitting: Internal Medicine

## 2023-03-17 ENCOUNTER — Other Ambulatory Visit (HOSPITAL_COMMUNITY): Payer: Self-pay

## 2023-03-17 ENCOUNTER — Other Ambulatory Visit: Payer: Self-pay

## 2023-03-17 ENCOUNTER — Other Ambulatory Visit: Payer: Self-pay | Admitting: Internal Medicine

## 2023-03-17 NOTE — Telephone Encounter (Signed)
[facility] is requesting RX for:  HYDROcodone-acetaminophen (NORCO/VICODIN) 5-325 MG tablet  & ALPRAZolam (XANAX) 1 MG tablet   RX's were last sent to [facility] and pt no longer wants to go to that pharmacy. Please send all RX's to [facility].

## 2023-03-18 ENCOUNTER — Other Ambulatory Visit (HOSPITAL_BASED_OUTPATIENT_CLINIC_OR_DEPARTMENT_OTHER): Payer: Self-pay

## 2023-03-18 ENCOUNTER — Other Ambulatory Visit (HOSPITAL_COMMUNITY): Payer: Self-pay

## 2023-03-18 ENCOUNTER — Other Ambulatory Visit: Payer: Self-pay | Admitting: Cardiology

## 2023-03-18 ENCOUNTER — Other Ambulatory Visit: Payer: Self-pay

## 2023-03-18 ENCOUNTER — Other Ambulatory Visit: Payer: Self-pay | Admitting: Internal Medicine

## 2023-03-18 MED ORDER — ASPIRIN 81 MG PO CHEW
81.0000 mg | CHEWABLE_TABLET | Freq: Two times a day (BID) | ORAL | 0 refills | Status: DC
  Filled 2023-03-18: qty 90, 45d supply, fill #0

## 2023-03-21 ENCOUNTER — Ambulatory Visit: Payer: 59 | Admitting: Psychology

## 2023-03-21 ENCOUNTER — Other Ambulatory Visit: Payer: Self-pay

## 2023-03-21 ENCOUNTER — Other Ambulatory Visit: Payer: Self-pay | Admitting: Cardiology

## 2023-03-21 ENCOUNTER — Other Ambulatory Visit (HOSPITAL_COMMUNITY): Payer: Self-pay

## 2023-03-21 MED ORDER — HYDROCODONE-ACETAMINOPHEN 5-325 MG PO TABS
1.0000 | ORAL_TABLET | Freq: Four times a day (QID) | ORAL | 0 refills | Status: DC | PRN
  Filled 2023-03-21: qty 30, 8d supply, fill #0

## 2023-03-21 MED ORDER — ALPRAZOLAM 1 MG PO TABS
1.0000 mg | ORAL_TABLET | Freq: Three times a day (TID) | ORAL | 1 refills | Status: DC | PRN
  Filled 2023-03-21: qty 90, 30d supply, fill #0
  Filled 2023-05-13: qty 90, 30d supply, fill #1

## 2023-03-21 NOTE — Telephone Encounter (Signed)
Okay.  Thanks.

## 2023-03-22 ENCOUNTER — Other Ambulatory Visit (HOSPITAL_COMMUNITY): Payer: Self-pay

## 2023-03-23 ENCOUNTER — Ambulatory Visit (INDEPENDENT_AMBULATORY_CARE_PROVIDER_SITE_OTHER): Payer: 59 | Admitting: Psychology

## 2023-03-23 DIAGNOSIS — F4323 Adjustment disorder with mixed anxiety and depressed mood: Secondary | ICD-10-CM | POA: Diagnosis not present

## 2023-03-23 NOTE — Progress Notes (Signed)
[name] Time: 4:10p-5:00 p50 minutes   [name] is a 72 y.o. 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  Symptoms A diagnosis of an acute, serious illness that is life-threatening. (Status: maintained) -- No Description Entered  Diminished interest in or enjoyment of activities. (Status: maintained) -- No Description Entered  Lack of energy. (Status: maintained) -- No Description Entered  Sad affect, social withdrawal, anxiety, loss of interest in activities, and low energy. (Status: maintained) -- No Description Entered  Medication Status compliance  Safety unspecified If Suicidal or Homicidal State Action Taken: unspecified  Current Risk:  Medications Buspar (Dosage: 30mg  2X/day)  New Medication (Dosage: 250mg  2x/day)  New Medication (Dosage: 250mg  2x/day)  Prozac (Dosage: 40mg  daily)  Remeron (Dosage: 30mg  nightly)  xanax (Dosage: .5mg  2-3X/day)  Objectives Related Problem: Recognize, accept, and cope with feelings of depression. Description: Identify and replace thoughts and beliefs that support depression. Target Date: 2023-04-29 Frequency: Daily Modality: individual Progress: 75%  Related Problem: Recognize, accept, and cope with feelings of depression. Description: Verbalize an understanding of healthy and unhealthy emotions with the intent of increasing the use of healthy emotions to guide actions. Target Date: 2023-04-29 Frequency: Daily Modality: individual Progress: 85%  Related Problem: Recognize, accept, and cope with feelings of depression. Description: Increasingly verbalize hopeful and positive statements regarding self, others, and the future. Target Date: 2022-04-28 Frequency: Daily Modality: individual Progress: 100%  Related Problem: Reduce fear,  anxiety, and worry associated with the medical condition. Description: Share with significant others efforts to adapt successfully to the medical condition/diagnosis. Target Date: 2023-04-29 Frequency: Daily Modality: individual Progress: 85%  Related Problem: Reduce fear, anxiety, and worry associated with the medical condition. Description: Engage in social, productive, and recreational activities that are possible in spite of medical condition. Target Date: 2023-04-29 Frequency: Daily Modality: individual Progress: 90%  Related Problem: Reduce fear, anxiety, and worry associated with the medical condition. Description: Identify the coping skills and sources of emotional support that have been beneficial in the past. Target Date: 2023-04-29 Frequency: Daily Modality: individual Progress: 80%  Client Response full compliance  Service Location Location, [address] [place], [place] [address]  Service Code cpt 5158453034  Related past to present  Facilitate problem solving  Identify/label emotions  Validate/empathize  Self care activities  Lifestyle change (exercise, nutrition)  Assess/facilitate readiness to change  Relaxation training  Rationally challenge thoughts or beliefs/cognitive restructuring  Comments  Dx: F43.21  Meds: Prozac, [name], patient is struggling to rebuild her life and adjust to her new circumstances. She suffers a negative self-concept and has little confidence in her judgement. She is seeking to reduce symptoms of anxiety and self-doubt and to re-define satisfa ction moving forward. Therapy will focus on cognitive restructuring to redefine her experiences and focus on those satisfying aspects of her life. Will target this goal completion for April1, 2021 (completed). She is also wanting to utilize counseling to adjust and accept her latest diagnosis of cancer. She wants assistance to better  manage this news and remain positive throughout treatment. Goal date is 12-22. Patient has made progress,  but her medical condition is variable and she requests additional treatment to manage moods throughout her medical ordeal. In addition, she is trying to manage feelings associated with her older son's mental health struggles. Revised goal date is 12-24.   Patient agrees to a video session and is aware of the limitations of this platform. She is at home and I am in office.   [name]: She says she is tired from following all of the news. She saw Dr. Donell Grant and he did not change any of her meds, even though her anxiety is worse. He gave her more Xanax (from 3 to 4 times/day). We talked about managing her use of the Xanax and the need to monitor her use of this medication. She agrees and will be careful not to exceed prescribed dose. She is feeling more stable physically. Is trying to remain positive and is actively grieving the loss of relationship with her son [name]. She struggles to grasp how/why he chose to use drugs/alcohol to cope.                                                                                                                                                                                                                              [name] Time: 11:45a-12:30p 45 minutes

## 2023-03-28 ENCOUNTER — Telehealth: Payer: Self-pay | Admitting: Internal Medicine

## 2023-03-28 DIAGNOSIS — E890 Postprocedural hypothyroidism: Secondary | ICD-10-CM | POA: Diagnosis not present

## 2023-03-28 DIAGNOSIS — Z96641 Presence of right artificial hip joint: Secondary | ICD-10-CM | POA: Diagnosis not present

## 2023-03-28 DIAGNOSIS — M47812 Spondylosis without myelopathy or radiculopathy, cervical region: Secondary | ICD-10-CM | POA: Diagnosis not present

## 2023-03-28 DIAGNOSIS — J4530 Mild persistent asthma, uncomplicated: Secondary | ICD-10-CM | POA: Diagnosis not present

## 2023-03-28 DIAGNOSIS — K580 Irritable bowel syndrome with diarrhea: Secondary | ICD-10-CM | POA: Diagnosis not present

## 2023-03-28 DIAGNOSIS — N1832 Chronic kidney disease, stage 3b: Secondary | ICD-10-CM | POA: Diagnosis not present

## 2023-03-28 DIAGNOSIS — Z471 Aftercare following joint replacement surgery: Secondary | ICD-10-CM | POA: Diagnosis not present

## 2023-03-28 DIAGNOSIS — D631 Anemia in chronic kidney disease: Secondary | ICD-10-CM | POA: Diagnosis not present

## 2023-03-28 DIAGNOSIS — M8580 Other specified disorders of bone density and structure, unspecified site: Secondary | ICD-10-CM | POA: Diagnosis not present

## 2023-03-28 DIAGNOSIS — G542 Cervical root disorders, not elsewhere classified: Secondary | ICD-10-CM | POA: Diagnosis not present

## 2023-03-28 DIAGNOSIS — M797 Fibromyalgia: Secondary | ICD-10-CM | POA: Diagnosis not present

## 2023-03-28 DIAGNOSIS — M5030 Other cervical disc degeneration, unspecified cervical region: Secondary | ICD-10-CM | POA: Diagnosis not present

## 2023-03-28 DIAGNOSIS — D630 Anemia in neoplastic disease: Secondary | ICD-10-CM | POA: Diagnosis not present

## 2023-03-28 DIAGNOSIS — I959 Hypotension, unspecified: Secondary | ICD-10-CM | POA: Diagnosis not present

## 2023-03-28 DIAGNOSIS — I700 Atherosclerosis of aorta: Secondary | ICD-10-CM | POA: Diagnosis not present

## 2023-03-28 DIAGNOSIS — H04123 Dry eye syndrome of bilateral lacrimal glands: Secondary | ICD-10-CM | POA: Diagnosis not present

## 2023-03-28 DIAGNOSIS — E559 Vitamin D deficiency, unspecified: Secondary | ICD-10-CM | POA: Diagnosis not present

## 2023-03-28 DIAGNOSIS — I129 Hypertensive chronic kidney disease with stage 1 through stage 4 chronic kidney disease, or unspecified chronic kidney disease: Secondary | ICD-10-CM | POA: Diagnosis not present

## 2023-03-28 NOTE — Telephone Encounter (Signed)
Jim from [facility] did the pts evaluation today and would like to do physical therapy for: 2X for 4 weeks Rosanne Ashing would also like a HH nurse to visit the home for medication management and a social worker to help with long term planning.   Rosanne Ashing gave [place] a few concerns about the pt: Pt reported low appetite for the past few weeks Pt states she is depressed but not suicidal.   Rosanne Ashing found a few medications that ere not listed in the PW:  Hydrocodone Acetaminophen PRN A Mushroom complex Tylenol 1000mg  at bed time Asprin 81 dailey B complex dailey.   Please call jim to confirm orders: [phone]

## 2023-03-29 ENCOUNTER — Ambulatory Visit: Payer: 59 | Admitting: Internal Medicine

## 2023-03-29 ENCOUNTER — Telehealth: Payer: Self-pay | Admitting: Internal Medicine

## 2023-03-29 NOTE — Telephone Encounter (Signed)
Noted! Thank you

## 2023-03-29 NOTE — Telephone Encounter (Signed)
Jim form [facility] called to report a level 2 drug interaction:  carvedilol (COREG) 12.5 MG tablet & cloNIDine (CATAPRES) 0.2 MG tablet   carvedilol (COREG) 12.5 MG tablet & ipratropium-albuterol (DUONEB) 0.5-2.5 (3) MG/3ML SOLN   Please call Rosanne Ashing with any questions: [phone]

## 2023-03-30 ENCOUNTER — Ambulatory Visit: Payer: 59 | Admitting: Psychology

## 2023-03-30 NOTE — Telephone Encounter (Signed)
Noted! Thank you

## 2023-03-31 ENCOUNTER — Telehealth: Payer: 59 | Admitting: Internal Medicine

## 2023-03-31 ENCOUNTER — Ambulatory Visit: Payer: 59 | Admitting: Cardiology

## 2023-03-31 ENCOUNTER — Encounter: Payer: Self-pay | Admitting: Internal Medicine

## 2023-03-31 DIAGNOSIS — M797 Fibromyalgia: Secondary | ICD-10-CM | POA: Diagnosis not present

## 2023-03-31 DIAGNOSIS — M47812 Spondylosis without myelopathy or radiculopathy, cervical region: Secondary | ICD-10-CM | POA: Diagnosis not present

## 2023-03-31 DIAGNOSIS — N1832 Chronic kidney disease, stage 3b: Secondary | ICD-10-CM

## 2023-03-31 DIAGNOSIS — D630 Anemia in neoplastic disease: Secondary | ICD-10-CM | POA: Diagnosis not present

## 2023-03-31 DIAGNOSIS — E559 Vitamin D deficiency, unspecified: Secondary | ICD-10-CM | POA: Diagnosis not present

## 2023-03-31 DIAGNOSIS — Z471 Aftercare following joint replacement surgery: Secondary | ICD-10-CM | POA: Diagnosis not present

## 2023-03-31 DIAGNOSIS — J42 Unspecified chronic bronchitis: Secondary | ICD-10-CM | POA: Diagnosis not present

## 2023-03-31 DIAGNOSIS — D631 Anemia in chronic kidney disease: Secondary | ICD-10-CM | POA: Diagnosis not present

## 2023-03-31 DIAGNOSIS — M8580 Other specified disorders of bone density and structure, unspecified site: Secondary | ICD-10-CM | POA: Diagnosis not present

## 2023-03-31 DIAGNOSIS — I959 Hypotension, unspecified: Secondary | ICD-10-CM | POA: Diagnosis not present

## 2023-03-31 DIAGNOSIS — H04123 Dry eye syndrome of bilateral lacrimal glands: Secondary | ICD-10-CM | POA: Diagnosis not present

## 2023-03-31 DIAGNOSIS — K580 Irritable bowel syndrome with diarrhea: Secondary | ICD-10-CM | POA: Diagnosis not present

## 2023-03-31 DIAGNOSIS — M5030 Other cervical disc degeneration, unspecified cervical region: Secondary | ICD-10-CM | POA: Diagnosis not present

## 2023-03-31 DIAGNOSIS — F314 Bipolar disorder, current episode depressed, severe, without psychotic features: Secondary | ICD-10-CM

## 2023-03-31 DIAGNOSIS — I129 Hypertensive chronic kidney disease with stage 1 through stage 4 chronic kidney disease, or unspecified chronic kidney disease: Secondary | ICD-10-CM | POA: Diagnosis not present

## 2023-03-31 DIAGNOSIS — J4530 Mild persistent asthma, uncomplicated: Secondary | ICD-10-CM | POA: Diagnosis not present

## 2023-03-31 DIAGNOSIS — I700 Atherosclerosis of aorta: Secondary | ICD-10-CM | POA: Diagnosis not present

## 2023-03-31 DIAGNOSIS — E890 Postprocedural hypothyroidism: Secondary | ICD-10-CM | POA: Diagnosis not present

## 2023-03-31 DIAGNOSIS — G542 Cervical root disorders, not elsewhere classified: Secondary | ICD-10-CM | POA: Diagnosis not present

## 2023-03-31 DIAGNOSIS — Z96641 Presence of right artificial hip joint: Secondary | ICD-10-CM | POA: Diagnosis not present

## 2023-03-31 MED ORDER — DOXYCYCLINE HYCLATE 100 MG PO TABS: 100.0000 mg | ORAL_TABLET | Freq: Two times a day (BID) | ORAL | 0 refills | Status: DC

## 2023-03-31 NOTE — Progress Notes (Signed)
Virtual Visit via Video Note  I connected with [name] on 03/31/23 at  4:00 PM EST by a video enabled telemedicine application and verified that I am speaking with the correct person using two identifiers.   I discussed the limitations of evaluation and management by telemedicine and the availability of in person appointments. The patient expressed understanding and agreed to proceed.  I was located at our [facility] office. The patient was at home. There was no one else present in the visit.  Chief Complaint  Patient presents with   Bronchitis    Pt states she does have but is more SOB and would like to have a chest x-ray done due to findings in recent CT of "Trace bilateral pleural effusions".     History of Present Illness:           Pt states she does have but is more SOB and would like to have a chest x-ray done due to findings in recent CT of "Trace bilateral pleural effusions".  C/o ST, cough, wheezing x 2-3 days, dry cough Review of Systems  Constitutional:  Negative for chills, diaphoresis and fever.  Respiratory:  Positive for cough, shortness of breath and wheezing. Negative for sputum production.   Cardiovascular:  Negative for palpitations and leg swelling.  Genitourinary:  Positive for frequency and urgency.  Neurological:  Negative for loss of consciousness and weakness.  Psychiatric/Behavioral:  Positive for depression. Negative for suicidal ideas. The patient is nervous/anxious and has insomnia.      Observations/Objective: The patient appears to be in no acute distress, looks tired.  Assessment and Plan:  Problem List Items Addressed This Visit     Bipolar affective disorder, current episode depressed (HCC)    F/u w/Dr Plovsky Now Wellbutrin was added  On Prozac      Relevant Medications   buPROPion (WELLBUTRIN XL) 150 MG 24 hr tablet   CKD stage 3b, GFR 30-44 ml/min (HCC)    Hydrate well      Chronic bronchitis (HCC) - Primary     Exacerbation CXR Start Doxy po      Relevant Orders   DG Chest 2 View     Meds ordered this encounter  Medications   doxycycline (VIBRA-TABS) 100 MG tablet    Sig: Take 1 tablet (100 mg total) by mouth 2 (two) times daily.    Dispense:  20 tablet    Refill:  0     Follow Up Instructions:    I discussed the assessment and treatment plan with the patient. The patient was provided an opportunity to ask questions and all were answered. The patient agreed with the plan and demonstrated an understanding of the instructions.   The patient was advised to call back or seek an in-person evaluation if the symptoms worsen or if the condition fails to improve as anticipated.  I provided face-to-face time during this encounter. We were at different locations.   Sonda Primes, MD

## 2023-03-31 NOTE — Assessment & Plan Note (Signed)
Hydrate well 

## 2023-03-31 NOTE — Assessment & Plan Note (Addendum)
F/u w/Dr Plovsky Now Wellbutrin was added  On Prozac

## 2023-03-31 NOTE — Assessment & Plan Note (Addendum)
Exacerbation CXR Start Doxy po

## 2023-04-01 ENCOUNTER — Telehealth: Payer: Self-pay | Admitting: Internal Medicine

## 2023-04-01 ENCOUNTER — Ambulatory Visit: Payer: 59 | Admitting: Internal Medicine

## 2023-04-01 DIAGNOSIS — H04123 Dry eye syndrome of bilateral lacrimal glands: Secondary | ICD-10-CM | POA: Diagnosis not present

## 2023-04-01 DIAGNOSIS — D631 Anemia in chronic kidney disease: Secondary | ICD-10-CM | POA: Diagnosis not present

## 2023-04-01 DIAGNOSIS — M47812 Spondylosis without myelopathy or radiculopathy, cervical region: Secondary | ICD-10-CM | POA: Diagnosis not present

## 2023-04-01 DIAGNOSIS — E890 Postprocedural hypothyroidism: Secondary | ICD-10-CM | POA: Diagnosis not present

## 2023-04-01 DIAGNOSIS — Z471 Aftercare following joint replacement surgery: Secondary | ICD-10-CM | POA: Diagnosis not present

## 2023-04-01 DIAGNOSIS — J4530 Mild persistent asthma, uncomplicated: Secondary | ICD-10-CM | POA: Diagnosis not present

## 2023-04-01 DIAGNOSIS — M797 Fibromyalgia: Secondary | ICD-10-CM | POA: Diagnosis not present

## 2023-04-01 DIAGNOSIS — G542 Cervical root disorders, not elsewhere classified: Secondary | ICD-10-CM | POA: Diagnosis not present

## 2023-04-01 DIAGNOSIS — I700 Atherosclerosis of aorta: Secondary | ICD-10-CM | POA: Diagnosis not present

## 2023-04-01 DIAGNOSIS — M5030 Other cervical disc degeneration, unspecified cervical region: Secondary | ICD-10-CM | POA: Diagnosis not present

## 2023-04-01 DIAGNOSIS — I129 Hypertensive chronic kidney disease with stage 1 through stage 4 chronic kidney disease, or unspecified chronic kidney disease: Secondary | ICD-10-CM | POA: Diagnosis not present

## 2023-04-01 DIAGNOSIS — M8580 Other specified disorders of bone density and structure, unspecified site: Secondary | ICD-10-CM | POA: Diagnosis not present

## 2023-04-01 DIAGNOSIS — D630 Anemia in neoplastic disease: Secondary | ICD-10-CM | POA: Diagnosis not present

## 2023-04-01 DIAGNOSIS — I959 Hypotension, unspecified: Secondary | ICD-10-CM | POA: Diagnosis not present

## 2023-04-01 DIAGNOSIS — E559 Vitamin D deficiency, unspecified: Secondary | ICD-10-CM | POA: Diagnosis not present

## 2023-04-01 DIAGNOSIS — Z96641 Presence of right artificial hip joint: Secondary | ICD-10-CM | POA: Diagnosis not present

## 2023-04-01 DIAGNOSIS — K580 Irritable bowel syndrome with diarrhea: Secondary | ICD-10-CM | POA: Diagnosis not present

## 2023-04-01 DIAGNOSIS — N1832 Chronic kidney disease, stage 3b: Secondary | ICD-10-CM | POA: Diagnosis not present

## 2023-04-01 NOTE — Telephone Encounter (Signed)
HH ORDERS   Caller Name: [facility] Agency Name: [facility] Phone #: [phone](secure)  Service Requested: skilled nursing for medication education and management (examples: OT/PT/Skilled Nursing/Social Work/Speech Therapy/Wound Care)  Frequency of Visits: 1 week 4

## 2023-04-04 ENCOUNTER — Ambulatory Visit: Payer: 59 | Admitting: Psychology

## 2023-04-04 ENCOUNTER — Ambulatory Visit: Payer: Self-pay

## 2023-04-04 NOTE — Patient Instructions (Addendum)
Visit Information  Thank you for taking time to visit with me today. Please don't hesitate to contact me if I can be of assistance to you.   Following are the goals we discussed today:  Continue to take medications as prescribed. Continue to attend provider visits as scheduled Continue to eat healthy, lean meats, vegetables, fruits, avoid saturated and transfats Contact provider with health questions or concerns as needed   Our next appointment is by telephone on 04/12/23 at 3:00 pm  Please call the care guide team at [phone] if you need to cancel or reschedule your appointment.   If you are experiencing a Mental Health or Behavioral Health Crisis or need someone to talk to, please call the Suicide and Crisis Lifeline: 988 call the [place] National Suicide Prevention Lifeline: 575-696-8469 or TTY: 406-275-9209 TTY 217-723-7733) to talk to a trained counselor call 1-800-273-TALK (toll free, 24 hour hotline)  Kathyrn Sheriff, RN, MSN, BSN, CCM Care Management Coordinator [phone]

## 2023-04-04 NOTE — Telephone Encounter (Signed)
Okay with me. Thanks 

## 2023-04-04 NOTE — Patient Outreach (Signed)
  Care Coordination   Follow Up Visit Note   04/04/2023 Name: [name] MRN: [id number] DOB: Dec 14, 1950  [name] is a 72 y.o. year old female who sees Plotnikov, Georgina Quint, MD for primary care. I spoke with  [name] by phone today.  What matters to the patients health and wellness today?  [name] last video visit with PCP 03/31/23. She reports she was prescribed doxycyline and states her cough is better. She reports not feeling right, energy level is down, reports headache, no appetite. PHQ2 5; PHQ9 18. [name] states she just realized last night that her xanax was not in her pill box. She states she has been taking for years and reports has been without xanax for about 3 days. She reports Dr. Donell Grant recently added Wellbutrin and has a follow up scheduled for 04/21/23. Patient states she is beginning to feel better since restarting xanax, But states she will contact psychiatrist to let him know about the missed doses of xanax; to get any additional recommendations.   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,  adjustment disorder, mixed anxiety/depressed mood.Leiomyosarcoma of uterus,]  General Interventions   General Interventions Discussed/Reviewed General Interventions Reviewed, Communication with  [Evaluation of current treatment plan for health condition and patient's adherence to plan.]  Communication with PCP/Specialists  [PCP update PHQ2/9]  Exercise Interventions   Exercise Discussed/Reviewed Exercise Discussed  [Confirmed that patient is active with home health therapy]  Education Interventions   Education Provided Provided Education  Provided Verbal Education On When to see the doctor, Medication,  Mental Health/Coping with Illness  [Crisis resource contact numbers provided.-patient states she is has contact numbers.]  Mental Health Interventions   Mental Health Discussed/Reviewed Mental Health Discussed  [advised to notify mental health providers: Dr. Donell Grant psychiatrist and discuss with Dr. Dellia Grant psychologist regarding regarding days of missed medications how she is feeling regarding self expressed depresstion]  Nutrition Interventions   Nutrition Discussed/Reviewed Nutrition Reviewed  Pharmacy Interventions   Pharmacy Dicussed/Reviewed Pharmacy Topics Reviewed  Safety Interventions   Safety Discussed/Reviewed Safety Discussed, Fall Risk            SDOH assessments and interventions completed:  Yes  SDOH Interventions Today    Flowsheet Row Most Recent Value  SDOH Interventions   Depression Interventions/Treatment  Counseling, Medication, Currently on Treatment  [Dr. Dellia Grant psychologist,  [name]-Psychiatrist]     Care Coordination Interventions:  Yes, provided   Follow up plan: Follow up call scheduled for 04/12/23    Encounter Outcome:  Patient Visit Completed   Charlotte Sheriff, RN, MSN, BSN, CCM Care Management Coordinator [phone]

## 2023-04-05 ENCOUNTER — Ambulatory Visit: Payer: 59 | Admitting: Internal Medicine

## 2023-04-05 ENCOUNTER — Telehealth: Payer: Self-pay | Admitting: Internal Medicine

## 2023-04-05 ENCOUNTER — Telehealth: Payer: Self-pay | Admitting: *Deleted

## 2023-04-05 ENCOUNTER — Ambulatory Visit: Payer: 59 | Admitting: Psychology

## 2023-04-05 ENCOUNTER — Other Ambulatory Visit (HOSPITAL_COMMUNITY): Payer: Self-pay

## 2023-04-05 ENCOUNTER — Encounter: Payer: Self-pay | Admitting: Gynecologic Oncology

## 2023-04-05 DIAGNOSIS — E319 Polyglandular dysfunction, unspecified: Secondary | ICD-10-CM

## 2023-04-05 DIAGNOSIS — I700 Atherosclerosis of aorta: Secondary | ICD-10-CM

## 2023-04-05 DIAGNOSIS — J4530 Mild persistent asthma, uncomplicated: Secondary | ICD-10-CM

## 2023-04-05 DIAGNOSIS — I959 Hypotension, unspecified: Secondary | ICD-10-CM

## 2023-04-05 DIAGNOSIS — Z471 Aftercare following joint replacement surgery: Secondary | ICD-10-CM

## 2023-04-05 DIAGNOSIS — D630 Anemia in neoplastic disease: Secondary | ICD-10-CM

## 2023-04-05 DIAGNOSIS — I129 Hypertensive chronic kidney disease with stage 1 through stage 4 chronic kidney disease, or unspecified chronic kidney disease: Secondary | ICD-10-CM

## 2023-04-05 DIAGNOSIS — D631 Anemia in chronic kidney disease: Secondary | ICD-10-CM

## 2023-04-05 DIAGNOSIS — Z96641 Presence of right artificial hip joint: Secondary | ICD-10-CM

## 2023-04-05 DIAGNOSIS — F0781 Postconcussional syndrome: Secondary | ICD-10-CM

## 2023-04-05 DIAGNOSIS — N1832 Chronic kidney disease, stage 3b: Secondary | ICD-10-CM

## 2023-04-05 DIAGNOSIS — C55 Malignant neoplasm of uterus, part unspecified: Secondary | ICD-10-CM

## 2023-04-05 NOTE — Telephone Encounter (Signed)
Spoke with [name] who called the office with multiple complaints of feeling confused at times, weak and dizzy. Pt reports multiple hospitalizations over the last 12 years. Pt is currently working with [facility] to help manage her medications because it's getting to difficult for her to manage by herself. Emotional support provided to patient for a lengthy phone conversation. Pt has an upcoming appt. With her PCP as well as a follow up appointment on 11/22 with Dr. [name]. Pt states her Aunt will be accompanying her to this appointment. Social Worker order placed. Pt verbalized understanding and thanked the office for putting the order in for social work.

## 2023-04-05 NOTE — Telephone Encounter (Signed)
Patient said she was told that her PCP needs to fill out a [employer] form in order for her to receive home health. It is form number 3051. She said it needs to be sent to [facility]. Best callback is [phone].

## 2023-04-06 ENCOUNTER — Inpatient Hospital Stay: Payer: 59 | Attending: Licensed Clinical Social Worker | Admitting: Licensed Clinical Social Worker

## 2023-04-06 DIAGNOSIS — H04123 Dry eye syndrome of bilateral lacrimal glands: Secondary | ICD-10-CM | POA: Diagnosis not present

## 2023-04-06 DIAGNOSIS — I959 Hypotension, unspecified: Secondary | ICD-10-CM | POA: Diagnosis not present

## 2023-04-06 DIAGNOSIS — J4530 Mild persistent asthma, uncomplicated: Secondary | ICD-10-CM | POA: Diagnosis not present

## 2023-04-06 DIAGNOSIS — D630 Anemia in neoplastic disease: Secondary | ICD-10-CM | POA: Diagnosis not present

## 2023-04-06 DIAGNOSIS — M797 Fibromyalgia: Secondary | ICD-10-CM | POA: Diagnosis not present

## 2023-04-06 DIAGNOSIS — M5030 Other cervical disc degeneration, unspecified cervical region: Secondary | ICD-10-CM | POA: Diagnosis not present

## 2023-04-06 DIAGNOSIS — I129 Hypertensive chronic kidney disease with stage 1 through stage 4 chronic kidney disease, or unspecified chronic kidney disease: Secondary | ICD-10-CM | POA: Diagnosis not present

## 2023-04-06 DIAGNOSIS — Z471 Aftercare following joint replacement surgery: Secondary | ICD-10-CM | POA: Diagnosis not present

## 2023-04-06 DIAGNOSIS — D631 Anemia in chronic kidney disease: Secondary | ICD-10-CM | POA: Diagnosis not present

## 2023-04-06 DIAGNOSIS — Z96641 Presence of right artificial hip joint: Secondary | ICD-10-CM | POA: Diagnosis not present

## 2023-04-06 DIAGNOSIS — N1832 Chronic kidney disease, stage 3b: Secondary | ICD-10-CM | POA: Diagnosis not present

## 2023-04-06 DIAGNOSIS — E559 Vitamin D deficiency, unspecified: Secondary | ICD-10-CM | POA: Diagnosis not present

## 2023-04-06 DIAGNOSIS — E890 Postprocedural hypothyroidism: Secondary | ICD-10-CM | POA: Diagnosis not present

## 2023-04-06 DIAGNOSIS — G542 Cervical root disorders, not elsewhere classified: Secondary | ICD-10-CM | POA: Diagnosis not present

## 2023-04-06 DIAGNOSIS — M8580 Other specified disorders of bone density and structure, unspecified site: Secondary | ICD-10-CM | POA: Diagnosis not present

## 2023-04-06 DIAGNOSIS — I700 Atherosclerosis of aorta: Secondary | ICD-10-CM | POA: Diagnosis not present

## 2023-04-06 DIAGNOSIS — M47812 Spondylosis without myelopathy or radiculopathy, cervical region: Secondary | ICD-10-CM | POA: Diagnosis not present

## 2023-04-06 DIAGNOSIS — K580 Irritable bowel syndrome with diarrhea: Secondary | ICD-10-CM | POA: Diagnosis not present

## 2023-04-06 NOTE — Progress Notes (Signed)
[facility] Clinical Social Work  Clinical Social Work was referred by nurse for assessment of psychosocial needs.  Clinical Social Worker contacted patient by phone to offer support and assess for needs.   Pt reports she has been having more difficulty at home with dizziness and would like an aide to come in 1-2 hours a day. She has been receiving weekly visits from an RN and PT with [facility] and, per pt, they recommended having the [employer] form completed.  Pt would not be able to afford a private pay care aide.  Pt's physical concerns are related to other health issues and not her history of cancer, so CSW recommended contacting PCP's office regarding this request/ referral need. Pt stated she left a message with them yesterday. Pt also agreed to have CSW message her [facility] care coordinator to update her.      Kassidy Dockendorf E Toya Palacios, LCSW  Clinical Social Worker [facility]

## 2023-04-06 NOTE — Telephone Encounter (Signed)
I was able to give April the verbal ok for skilled nursing for medication education and management Frequency of Visits: 1 week 4

## 2023-04-07 DIAGNOSIS — Z471 Aftercare following joint replacement surgery: Secondary | ICD-10-CM | POA: Diagnosis not present

## 2023-04-07 DIAGNOSIS — G542 Cervical root disorders, not elsewhere classified: Secondary | ICD-10-CM | POA: Diagnosis not present

## 2023-04-07 DIAGNOSIS — H04123 Dry eye syndrome of bilateral lacrimal glands: Secondary | ICD-10-CM | POA: Diagnosis not present

## 2023-04-07 DIAGNOSIS — J4530 Mild persistent asthma, uncomplicated: Secondary | ICD-10-CM | POA: Diagnosis not present

## 2023-04-07 DIAGNOSIS — Z96641 Presence of right artificial hip joint: Secondary | ICD-10-CM | POA: Diagnosis not present

## 2023-04-07 DIAGNOSIS — I129 Hypertensive chronic kidney disease with stage 1 through stage 4 chronic kidney disease, or unspecified chronic kidney disease: Secondary | ICD-10-CM | POA: Diagnosis not present

## 2023-04-07 DIAGNOSIS — N1832 Chronic kidney disease, stage 3b: Secondary | ICD-10-CM | POA: Diagnosis not present

## 2023-04-07 DIAGNOSIS — I959 Hypotension, unspecified: Secondary | ICD-10-CM | POA: Diagnosis not present

## 2023-04-07 DIAGNOSIS — E890 Postprocedural hypothyroidism: Secondary | ICD-10-CM | POA: Diagnosis not present

## 2023-04-07 DIAGNOSIS — D630 Anemia in neoplastic disease: Secondary | ICD-10-CM | POA: Diagnosis not present

## 2023-04-07 DIAGNOSIS — M47812 Spondylosis without myelopathy or radiculopathy, cervical region: Secondary | ICD-10-CM | POA: Diagnosis not present

## 2023-04-07 DIAGNOSIS — M797 Fibromyalgia: Secondary | ICD-10-CM | POA: Diagnosis not present

## 2023-04-07 DIAGNOSIS — M5030 Other cervical disc degeneration, unspecified cervical region: Secondary | ICD-10-CM | POA: Diagnosis not present

## 2023-04-07 DIAGNOSIS — I700 Atherosclerosis of aorta: Secondary | ICD-10-CM | POA: Diagnosis not present

## 2023-04-07 DIAGNOSIS — D631 Anemia in chronic kidney disease: Secondary | ICD-10-CM | POA: Diagnosis not present

## 2023-04-07 DIAGNOSIS — K580 Irritable bowel syndrome with diarrhea: Secondary | ICD-10-CM | POA: Diagnosis not present

## 2023-04-07 DIAGNOSIS — M8580 Other specified disorders of bone density and structure, unspecified site: Secondary | ICD-10-CM | POA: Diagnosis not present

## 2023-04-07 DIAGNOSIS — E559 Vitamin D deficiency, unspecified: Secondary | ICD-10-CM | POA: Diagnosis not present

## 2023-04-08 ENCOUNTER — Inpatient Hospital Stay: Payer: 59 | Admitting: Gynecologic Oncology

## 2023-04-08 ENCOUNTER — Telehealth: Payer: Self-pay | Admitting: *Deleted

## 2023-04-08 DIAGNOSIS — C55 Malignant neoplasm of uterus, part unspecified: Secondary | ICD-10-CM

## 2023-04-08 NOTE — Telephone Encounter (Signed)
Spoke with [name] who called the office to reschedule her appointment today with Dr. [name] because she has Bronchitis. New appointment for Thursday, January 9 th at 1 pm was given and patient agreed to date and time.

## 2023-04-08 NOTE — Progress Notes (Unsigned)
Patient called to cancel day of appt

## 2023-04-10 NOTE — Telephone Encounter (Signed)
It is a form for Medicaid recipients. Did Jemima Portales get Medicaid?  Thanks

## 2023-04-12 ENCOUNTER — Other Ambulatory Visit: Payer: Self-pay

## 2023-04-12 ENCOUNTER — Ambulatory Visit: Payer: Self-pay

## 2023-04-12 DIAGNOSIS — H04123 Dry eye syndrome of bilateral lacrimal glands: Secondary | ICD-10-CM | POA: Diagnosis not present

## 2023-04-12 DIAGNOSIS — Z96641 Presence of right artificial hip joint: Secondary | ICD-10-CM | POA: Diagnosis not present

## 2023-04-12 DIAGNOSIS — M797 Fibromyalgia: Secondary | ICD-10-CM | POA: Diagnosis not present

## 2023-04-12 DIAGNOSIS — I129 Hypertensive chronic kidney disease with stage 1 through stage 4 chronic kidney disease, or unspecified chronic kidney disease: Secondary | ICD-10-CM | POA: Diagnosis not present

## 2023-04-12 DIAGNOSIS — M5030 Other cervical disc degeneration, unspecified cervical region: Secondary | ICD-10-CM | POA: Diagnosis not present

## 2023-04-12 DIAGNOSIS — I700 Atherosclerosis of aorta: Secondary | ICD-10-CM | POA: Diagnosis not present

## 2023-04-12 DIAGNOSIS — E890 Postprocedural hypothyroidism: Secondary | ICD-10-CM | POA: Diagnosis not present

## 2023-04-12 DIAGNOSIS — M8580 Other specified disorders of bone density and structure, unspecified site: Secondary | ICD-10-CM | POA: Diagnosis not present

## 2023-04-12 DIAGNOSIS — M47812 Spondylosis without myelopathy or radiculopathy, cervical region: Secondary | ICD-10-CM | POA: Diagnosis not present

## 2023-04-12 DIAGNOSIS — E559 Vitamin D deficiency, unspecified: Secondary | ICD-10-CM | POA: Diagnosis not present

## 2023-04-12 DIAGNOSIS — D631 Anemia in chronic kidney disease: Secondary | ICD-10-CM | POA: Diagnosis not present

## 2023-04-12 DIAGNOSIS — J4530 Mild persistent asthma, uncomplicated: Secondary | ICD-10-CM | POA: Diagnosis not present

## 2023-04-12 DIAGNOSIS — K580 Irritable bowel syndrome with diarrhea: Secondary | ICD-10-CM | POA: Diagnosis not present

## 2023-04-12 DIAGNOSIS — D630 Anemia in neoplastic disease: Secondary | ICD-10-CM | POA: Diagnosis not present

## 2023-04-12 DIAGNOSIS — Z471 Aftercare following joint replacement surgery: Secondary | ICD-10-CM | POA: Diagnosis not present

## 2023-04-12 DIAGNOSIS — G542 Cervical root disorders, not elsewhere classified: Secondary | ICD-10-CM | POA: Diagnosis not present

## 2023-04-12 DIAGNOSIS — N1832 Chronic kidney disease, stage 3b: Secondary | ICD-10-CM | POA: Diagnosis not present

## 2023-04-12 DIAGNOSIS — I959 Hypotension, unspecified: Secondary | ICD-10-CM | POA: Diagnosis not present

## 2023-04-12 NOTE — Patient Outreach (Signed)
  Care Coordination   Follow Up Visit Note   04/12/2023 Name: [name] MRN: [id number] DOB: 05-29-1950  [name] is a 72 y.o. year old female who sees Plotnikov, Georgina Quint, MD for primary care. I spoke with  [name] by phone today.  What matters to the patients health and wellness today?  [name] reports her father passed away a couple days ago and she is grieving the loss although it was expected. She continue to be active with home health. Patient reports Psychologist, forensic made a visit to see her today which was helpful. And she reports she has an appointment with Dr. Dellia Cloud psychologist on tomorrow. 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, adjustment disorder, mixed anxiety/depressed mood.Leiomyosarcoma of uterus,]  General Interventions   General Interventions Discussed/Reviewed General Interventions Reviewed, Doctor Visits  [Evaluation of current treatment plan for health condition and patient's adherence to plan.]  Doctor Visits Discussed/Reviewed Doctor Visits Reviewed  Education Interventions   Education Provided Provided Education  Provided Verbal Education On Mental Health/Coping with Illness, Medication, When to see the doctor  Mental Health Interventions   Mental Health Discussed/Reviewed Mental Health Discussed, Mental Health Reviewed, Coping Strategies, Grief and Loss  [active listening and support.]  Nutrition Interventions   Nutrition Discussed/Reviewed Nutrition Reviewed  Pharmacy Interventions   Pharmacy Dicussed/Reviewed Pharmacy Topics Discussed  [assess if any additional medication needs]   Safety Interventions   Safety Discussed/Reviewed Safety Reviewed, Fall Risk  [confirmed remains active with home health PT. reports PT is going well. no pain in hip.]            SDOH assessments and interventions completed:  No  Care Coordination Interventions:  Yes, provided   Follow up plan: Follow up call scheduled for 05/02/23    Encounter Outcome:  Patient Visit Completed   Kathyrn Sheriff, RN, MSN, BSN, CCM Care Management Coordinator [phone]

## 2023-04-12 NOTE — Patient Instructions (Signed)
Visit Information  Thank you for taking time to visit with me today. Please don't hesitate to contact me if I can be of assistance to you.   Following are the goals we discussed today:  Continue to take medications as prescribed. Continue to attend provider visits as scheduled Continue to eat healthy, lean meats, vegetables, fruits, avoid saturated and transfats Contact provider with health questions or concerns as needed  Our next appointment is by telephone on 05/02/23 at 3:00 pm  Please call the care guide team at [phone] if you need to cancel or reschedule your appointment.   If you are experiencing a Mental Health or Behavioral Health Crisis or need someone to talk to, please call the Suicide and Crisis Lifeline: 988 call the [place] National Suicide Prevention Lifeline: 424-025-9577 or TTY: 701-546-4132 TTY 304-772-0970) to talk to a trained counselor call 1-800-273-TALK (toll free, 24 hour hotline)   Kathyrn Sheriff, RN, MSN, BSN, CCM Care Management Coordinator [phone]

## 2023-04-13 ENCOUNTER — Ambulatory Visit: Payer: 59 | Admitting: Psychology

## 2023-04-13 ENCOUNTER — Telehealth: Payer: Self-pay | Admitting: Internal Medicine

## 2023-04-13 DIAGNOSIS — F4321 Adjustment disorder with depressed mood: Secondary | ICD-10-CM | POA: Diagnosis not present

## 2023-04-13 DIAGNOSIS — F4323 Adjustment disorder with mixed anxiety and depressed mood: Secondary | ICD-10-CM

## 2023-04-13 NOTE — Telephone Encounter (Signed)
I was able to speak with the pt and she has stated she is now on medicaid and needs this form sent in.
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
## 2023-04-13 NOTE — Telephone Encounter (Signed)
[name] calling from [facility]  letting Dr. Macario Golds know the that the pt haven't been feeling well lately. Pt has been complaining about  headaches and having nausea and she also decline HH today due to not feeling well.   Best call back number for [name] is [phone]

## 2023-04-18 ENCOUNTER — Other Ambulatory Visit (HOSPITAL_COMMUNITY): Payer: Self-pay

## 2023-04-18 ENCOUNTER — Other Ambulatory Visit: Payer: Self-pay

## 2023-04-18 ENCOUNTER — Telehealth: Payer: Self-pay | Admitting: Internal Medicine

## 2023-04-18 ENCOUNTER — Ambulatory Visit (INDEPENDENT_AMBULATORY_CARE_PROVIDER_SITE_OTHER): Payer: 59 | Admitting: Psychology

## 2023-04-18 DIAGNOSIS — F4321 Adjustment disorder with depressed mood: Secondary | ICD-10-CM

## 2023-04-18 DIAGNOSIS — F4323 Adjustment disorder with mixed anxiety and depressed mood: Secondary | ICD-10-CM

## 2023-04-18 NOTE — Telephone Encounter (Signed)
Patient wants to know if Dr. Posey Rea can send in a script of Immodium   Please send to [facility] pharmacy

## 2023-04-18 NOTE — Progress Notes (Signed)
[name] Time: 4:10p-5:00 p50 minutes   [name] is a 72 y.o. 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  Symptoms A diagnosis of an acute, serious illness that is life-threatening. (Status: maintained) -- No Description Entered  Diminished interest in or enjoyment of activities. (Status: maintained) -- No Description Entered  Lack of energy. (Status: maintained) -- No Description Entered  Sad affect, social withdrawal, anxiety, loss of interest in activities, and low energy. (Status: maintained) -- No Description Entered  Medication Status compliance  Safety unspecified If Suicidal or Homicidal State Action Taken: unspecified  Current Risk:  Medications Buspar (Dosage: 30mg  2X/day)  New Medication (Dosage: 250mg  2x/day)  New Medication (Dosage: 250mg  2x/day)  Prozac (Dosage: 40mg  daily)  Remeron (Dosage: 30mg  nightly)  xanax (Dosage: .5mg  2-3X/day)  Objectives Related Problem: Recognize, accept, and cope with feelings of depression. Description: Identify and replace thoughts and beliefs that support depression. Target Date: 2024-04-28 Frequency: Daily Modality: individual Progress: 80%  Related Problem: Recognize, accept, and cope with feelings of depression. Description: Verbalize an understanding of healthy and unhealthy emotions with the intent of increasing the use of healthy emotions to guide actions. Target Date: 2024-04-28 Frequency: Daily Modality: individual Progress: 85%  Related Problem: Recognize, accept, and cope with feelings of depression. Description: Increasingly verbalize hopeful and positive statements regarding self, others, and the future. Target Date: 2022-04-28 Frequency: Daily Modality:  individual Progress: 100%  Related Problem: Reduce fear, anxiety, and worry associated with the medical condition. Description: Share with significant others efforts to adapt successfully to the medical condition/diagnosis. Target Date: 2024-04-28 Frequency: Daily Modality: individual Progress: 85%  Related Problem: Reduce fear, anxiety, and worry associated with the medical condition. Description: Engage in social, productive, and recreational activities that are possible in spite of medical condition. Target Date: 2024-04-28 Frequency: Daily Modality: individual Progress: 90%  Related Problem: Reduce fear, anxiety, and worry associated with the medical condition. Description: Identify the coping skills and sources of emotional support that have been beneficial in the past. Target Date: 2024-04-28 Frequency: Daily Modality: individual Progress: 90%  Client Response full compliance  Service Location Location, [address] [place], [place] [address]  Service Code cpt [phone]  Related past to present  Facilitate problem solving  Identify/label emotions  Validate/empathize  Self care activities  Lifestyle change (exercise, nutrition)  Assess/facilitate readiness to change  Relaxation training  Rationally challenge thoughts or beliefs/cognitive restructuring  Comments  Dx: F43.21  Meds: Prozac, [name], patient is struggling to rebuild her life and adjust to her new circumstances. She suffers a negative self-concept and has little confidence in her judgement. She is seeking to reduce symptoms of anxiety and self-doubt and to re-define satisfa ction moving forward. Therapy will focus on cognitive restructuring to redefine her experiences and focus on those satisfying aspects of her life. Will target this goal completion for April1, 2021 (completed). She is also wanting to utilize counseling to  adjust and accept her  latest diagnosis of cancer. She wants assistance to better manage this news and remain positive throughout treatment. Goal date is 12-22. Patient has made progress, but her medical condition is variable and she requests additional treatment to manage moods throughout her medical ordeal. In addition, she is trying to manage feelings associated with her older son's mental health struggles. Revised goal date is 12-25.   Patient agrees to a video session and is aware of the limitations of this platform. She is at home and I am in office.   [name]: She states that she has been working to improve move since last session. She reports some moderate improvement. She is still grieving, but recognizes "it was time for him to go". There will be a service for him on Friday afternoon. She reminisced about her very positive childhood and expressed gratitude for all she experienced. She is less focussed on the negatives (her health, relationship with [name]) and trying to be more optimistic. She is finding comfort in her spiritual beliefs.                                                                                                                                                                                                                                  [name] Time: 4:15p-5:00p 45 minutes

## 2023-04-19 ENCOUNTER — Telehealth: Payer: Self-pay | Admitting: Diagnostic Neuroimaging

## 2023-04-19 ENCOUNTER — Other Ambulatory Visit: Payer: Self-pay

## 2023-04-19 ENCOUNTER — Other Ambulatory Visit (HOSPITAL_COMMUNITY): Payer: Self-pay

## 2023-04-19 DIAGNOSIS — M47812 Spondylosis without myelopathy or radiculopathy, cervical region: Secondary | ICD-10-CM | POA: Diagnosis not present

## 2023-04-19 DIAGNOSIS — M797 Fibromyalgia: Secondary | ICD-10-CM | POA: Diagnosis not present

## 2023-04-19 DIAGNOSIS — M8580 Other specified disorders of bone density and structure, unspecified site: Secondary | ICD-10-CM | POA: Diagnosis not present

## 2023-04-19 DIAGNOSIS — G542 Cervical root disorders, not elsewhere classified: Secondary | ICD-10-CM | POA: Diagnosis not present

## 2023-04-19 DIAGNOSIS — I129 Hypertensive chronic kidney disease with stage 1 through stage 4 chronic kidney disease, or unspecified chronic kidney disease: Secondary | ICD-10-CM | POA: Diagnosis not present

## 2023-04-19 DIAGNOSIS — K580 Irritable bowel syndrome with diarrhea: Secondary | ICD-10-CM | POA: Diagnosis not present

## 2023-04-19 DIAGNOSIS — D631 Anemia in chronic kidney disease: Secondary | ICD-10-CM | POA: Diagnosis not present

## 2023-04-19 DIAGNOSIS — Z96641 Presence of right artificial hip joint: Secondary | ICD-10-CM | POA: Diagnosis not present

## 2023-04-19 DIAGNOSIS — E559 Vitamin D deficiency, unspecified: Secondary | ICD-10-CM | POA: Diagnosis not present

## 2023-04-19 DIAGNOSIS — Z471 Aftercare following joint replacement surgery: Secondary | ICD-10-CM | POA: Diagnosis not present

## 2023-04-19 DIAGNOSIS — M5030 Other cervical disc degeneration, unspecified cervical region: Secondary | ICD-10-CM | POA: Diagnosis not present

## 2023-04-19 DIAGNOSIS — J4530 Mild persistent asthma, uncomplicated: Secondary | ICD-10-CM | POA: Diagnosis not present

## 2023-04-19 DIAGNOSIS — N1832 Chronic kidney disease, stage 3b: Secondary | ICD-10-CM | POA: Diagnosis not present

## 2023-04-19 DIAGNOSIS — D630 Anemia in neoplastic disease: Secondary | ICD-10-CM | POA: Diagnosis not present

## 2023-04-19 DIAGNOSIS — H04123 Dry eye syndrome of bilateral lacrimal glands: Secondary | ICD-10-CM | POA: Diagnosis not present

## 2023-04-19 DIAGNOSIS — E890 Postprocedural hypothyroidism: Secondary | ICD-10-CM | POA: Diagnosis not present

## 2023-04-19 DIAGNOSIS — I959 Hypotension, unspecified: Secondary | ICD-10-CM | POA: Diagnosis not present

## 2023-04-19 DIAGNOSIS — I700 Atherosclerosis of aorta: Secondary | ICD-10-CM | POA: Diagnosis not present

## 2023-04-19 MED ORDER — LOPERAMIDE HCL 2 MG PO TABS
2.0000 mg | ORAL_TABLET | Freq: Four times a day (QID) | ORAL | 1 refills | Status: DC | PRN
  Filled 2023-04-19: qty 96, 24d supply, fill #0

## 2023-04-19 NOTE — Telephone Encounter (Signed)
Called the patient back and discussed her concerns. Since her last visit she had hip replacement and since that surgery she has had decline in her mental status an cognition along with anxiety and depression. She is seeing the MD that treats her anxiety and depression on Thursday but she is concerned about the memory recollection and cognition status that she has seen worsening with. Social worker was there with her today and recommends that she at least work on getting approved for the daycare program. Advised that since her visit was in Aug 2024 and MMSE was 23/30 there is documentation to reflect the mild cognitive impairment. If there is something needed from neurological standpoint then likely the August notes should be ok. I scheduled for 05/24/2023 apt.

## 2023-04-19 NOTE — Telephone Encounter (Signed)
Okay.  Thanks.

## 2023-04-19 NOTE — Telephone Encounter (Signed)
Pt states her dementia is worsening.  She is wanting an evaluation from Dr Marjory Lies to see about approval for a Memory Care unit thru Social Service, please call pt to discuss.  Pt declined Dr Richrd Humbles next available in June with wait list, please call as she has been told it is not safe for her to be left alone due to the state of her memory.

## 2023-04-19 NOTE — Telephone Encounter (Signed)
Noted! Thank you

## 2023-04-20 ENCOUNTER — Telehealth: Payer: Self-pay | Admitting: Internal Medicine

## 2023-04-20 ENCOUNTER — Other Ambulatory Visit: Payer: Self-pay

## 2023-04-20 NOTE — Telephone Encounter (Signed)
It has been done Thx

## 2023-04-20 NOTE — Telephone Encounter (Signed)
Rosanne Ashing from [facility] states he saw the pt last night, and she is being discharged from [facility] care.   Pt stated to Rosanne Ashing her plan is to go to the [facility] day program.  Please call Rosanne Ashing with any questions: [phone]

## 2023-04-21 ENCOUNTER — Other Ambulatory Visit (HOSPITAL_COMMUNITY): Payer: Self-pay

## 2023-04-21 ENCOUNTER — Encounter: Payer: Self-pay | Admitting: Family Medicine

## 2023-04-21 ENCOUNTER — Other Ambulatory Visit: Payer: Self-pay

## 2023-04-21 ENCOUNTER — Ambulatory Visit: Payer: 59

## 2023-04-21 ENCOUNTER — Ambulatory Visit: Payer: 59 | Admitting: Family Medicine

## 2023-04-21 VITALS — BP 142/88 | HR 75 | Temp 97.6°F | Ht 61.0 in | Wt 171.0 lb

## 2023-04-21 DIAGNOSIS — N3000 Acute cystitis without hematuria: Secondary | ICD-10-CM

## 2023-04-21 DIAGNOSIS — N183 Chronic kidney disease, stage 3 unspecified: Secondary | ICD-10-CM

## 2023-04-21 DIAGNOSIS — R300 Dysuria: Secondary | ICD-10-CM

## 2023-04-21 DIAGNOSIS — J42 Unspecified chronic bronchitis: Secondary | ICD-10-CM

## 2023-04-21 DIAGNOSIS — N390 Urinary tract infection, site not specified: Secondary | ICD-10-CM

## 2023-04-21 DIAGNOSIS — R059 Cough, unspecified: Secondary | ICD-10-CM | POA: Diagnosis not present

## 2023-04-21 LAB — CBC WITH DIFFERENTIAL/PLATELET
Basophils Absolute: 0.1 10*3/uL (ref 0.0–0.1)
Basophils Relative: 1 % (ref 0.0–3.0)
Eosinophils Absolute: 0.2 10*3/uL (ref 0.0–0.7)
Eosinophils Relative: 2.7 % (ref 0.0–5.0)
HCT: 34.5 % — ABNORMAL LOW (ref 36.0–46.0)
Hemoglobin: 12.1 g/dL (ref 12.0–15.0)
Lymphocytes Relative: 17.1 % (ref 12.0–46.0)
Lymphs Abs: 1.4 10*3/uL (ref 0.7–4.0)
MCHC: 35.2 g/dL (ref 30.0–36.0)
MCV: 92.4 fL (ref 78.0–100.0)
Monocytes Absolute: 0.6 10*3/uL (ref 0.1–1.0)
Monocytes Relative: 7.1 % (ref 3.0–12.0)
Neutro Abs: 6 10*3/uL (ref 1.4–7.7)
Neutrophils Relative %: 72.1 % (ref 43.0–77.0)
Platelets: 304 10*3/uL (ref 150.0–400.0)
RBC: 3.74 Mil/uL — ABNORMAL LOW (ref 3.87–5.11)
RDW: 13.8 % (ref 11.5–15.5)
WBC: 8.3 10*3/uL (ref 4.0–10.5)

## 2023-04-21 LAB — POC URINALSYSI DIPSTICK (AUTOMATED)
Bilirubin, UA: NEGATIVE
Blood, UA: NEGATIVE
Glucose, UA: NEGATIVE
Ketones, UA: NEGATIVE
Nitrite, UA: POSITIVE
Protein, UA: NEGATIVE
Spec Grav, UA: 1.02 (ref 1.010–1.025)
Urobilinogen, UA: 0.2 U/dL
pH, UA: 6 (ref 5.0–8.0)

## 2023-04-21 LAB — BASIC METABOLIC PANEL
BUN: 22 mg/dL (ref 6–23)
CO2: 27 meq/L (ref 19–32)
Calcium: 8.9 mg/dL (ref 8.4–10.5)
Chloride: 102 meq/L (ref 96–112)
Creatinine, Ser: 1.21 mg/dL — ABNORMAL HIGH (ref 0.40–1.20)
GFR: 44.54 mL/min — ABNORMAL LOW (ref >60.00)
Glucose, Bld: 83 mg/dL (ref 70–99)
Potassium: 4.5 meq/L (ref 3.5–5.1)
Sodium: 136 meq/L (ref 135–145)

## 2023-04-21 MED ORDER — CIPROFLOXACIN HCL 250 MG PO TABS
250.0000 mg | ORAL_TABLET | Freq: Two times a day (BID) | ORAL | 0 refills | Status: AC
  Filled 2023-04-21 (×3): qty 6, 3d supply, fill #0

## 2023-04-21 NOTE — Patient Instructions (Signed)
Stay hydrated but don't overdo it with water alone.   Take the antibiotic.    We will be in touch with your results.   Follow up with Dr. Posey Rea as recommended.

## 2023-04-21 NOTE — Telephone Encounter (Signed)
Noted. Thanks.
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
## 2023-04-22 ENCOUNTER — Other Ambulatory Visit: Payer: Self-pay

## 2023-04-23 LAB — URINE CULTURE

## 2023-04-26 ENCOUNTER — Ambulatory Visit: Payer: 59 | Admitting: Internal Medicine

## 2023-04-26 ENCOUNTER — Other Ambulatory Visit (HOSPITAL_COMMUNITY): Payer: Self-pay

## 2023-04-26 ENCOUNTER — Encounter: Payer: Self-pay | Admitting: Internal Medicine

## 2023-04-26 ENCOUNTER — Other Ambulatory Visit: Payer: Self-pay

## 2023-04-26 VITALS — BP 134/72 | HR 82 | Temp 98.1°F | Ht 61.0 in | Wt 172.8 lb

## 2023-04-26 DIAGNOSIS — F332 Major depressive disorder, recurrent severe without psychotic features: Secondary | ICD-10-CM

## 2023-04-26 DIAGNOSIS — F314 Bipolar disorder, current episode depressed, severe, without psychotic features: Secondary | ICD-10-CM

## 2023-04-26 DIAGNOSIS — N3090 Cystitis, unspecified without hematuria: Secondary | ICD-10-CM | POA: Diagnosis not present

## 2023-04-26 DIAGNOSIS — Z23 Encounter for immunization: Secondary | ICD-10-CM | POA: Diagnosis not present

## 2023-04-26 DIAGNOSIS — F09 Unspecified mental disorder due to known physiological condition: Secondary | ICD-10-CM

## 2023-04-26 MED ORDER — ALPRAZOLAM 0.5 MG PO TABS: 0.5000 mg | ORAL_TABLET | Freq: Three times a day (TID) | ORAL | 0 refills | Status: DC | PRN

## 2023-04-26 MED ORDER — CIPROFLOXACIN HCL 250 MG PO TABS: 250.0000 mg | ORAL_TABLET | Freq: Two times a day (BID) | ORAL | 0 refills | Status: AC

## 2023-04-26 NOTE — Assessment & Plan Note (Signed)
  Seeing Dr Donell Beers, Dr Dellia Cloud On Mirtazipine, Buspar, Fluoxetine, Xanax prn

## 2023-04-26 NOTE — Assessment & Plan Note (Signed)
 F/u w/Dr Plovsky Now Wellbutrin was added  On Prozac

## 2023-04-26 NOTE — Progress Notes (Signed)
Subjective:  Patient ID: [name], female    DOB: May 16, 1951  Age: 72 y.o. MRN: [id number]  CC: Follow-up   HPI CHRISTNA MODGLIN presents for UTIs, memory loss, anxiety  Outpatient Medications Prior to Visit  Medication Sig Dispense Refill   ALPRAZolam (XANAX) 1 MG tablet Take 1 tablet (1 mg total) by mouth 3 (three) times daily as needed for anxiety. 90 tablet 1   ascorbic acid (VITAMIN C) 500 MG tablet Take 1 tablet (500 mg total) by mouth daily. 100 tablet 3   aspirin EC 81 MG tablet Take 81 mg by mouth daily. Swallow whole.     buPROPion (WELLBUTRIN XL) 150 MG 24 hr tablet Take 150 mg by mouth every morning.     busPIRone (BUSPAR) 30 MG tablet Take 1 tablet (30 mg total) by mouth 2 (two) times daily. 60 tablet 3   carvedilol (COREG) 12.5 MG tablet Take 1 tablet (12.5 mg total) by mouth 2 (two) times daily with a meal. 60 tablet 2   Cholecalciferol (D3 5000) 125 MCG (5000 UT) capsule Take 1 capsule (5,000 Units total) by mouth daily. 100 capsule 3   cloNIDine (CATAPRES) 0.2 MG tablet Take 1 tablet (0.2 mg total) by mouth at bedtime. 30 tablet 2   colesevelam (WELCHOL) 625 MG tablet Take 2 tablets (1,250 mg total) by mouth 2 (two) times daily with a meal. 120 tablet 11   cyanocobalamin (VITAMIN B12) 1000 MCG tablet Take 1 tablet (1,000 mcg total) by mouth daily. Take 1,000 mcg by mouth daily. 100 tablet 3   D-Mannose 500 MG CAPS Take 500 mg by mouth 2 (two) times daily. WITH CRANBERRY & DANDELION EXTRACT 180 capsule 3   esomeprazole (NEXIUM) 40 MG capsule Take 1 capsule (40 mg total) by mouth every morning. 30 capsule 3   FLUoxetine (PROZAC) 40 MG capsule Take 1 capsule (40 mg total) by mouth every morning. 30 capsule 11   hydrALAZINE (APRESOLINE) 10 MG tablet Take 2 tablets (20 mg total) by mouth in the morning, at noon, and at bedtime. 180 tablet 4   HYDROcodone-acetaminophen (NORCO/VICODIN) 5-325 MG tablet Take 1 tablet by mouth every 6 (six) hours as needed for  moderate pain (pain score 4-6). 30 tablet 0   ipratropium-albuterol (DUONEB) 0.5-2.5 (3) MG/3ML SOLN Take 3 mLs by nebulization every 6 (six) hours as needed (Asthma).     iron polysaccharides (POLY-IRON 150) 150 MG capsule Take 1 capsule (150 mg total) by mouth daily. (Patient taking differently: Take 150 mg by mouth 2 (two) times daily.) 90 capsule 3   Lactobacillus (ACIDOPHILUS) CAPS capsule Take 1 capsule by mouth daily. 100 capsule 3   levothyroxine (SYNTHROID) 100 MCG tablet Take 1 tablet (100 mcg total) by mouth daily before breakfast. 30 tablet 11   loperamide (IMODIUM A-D) 2 MG tablet Take 1 tablet (2 mg total) by mouth 4 (four) times daily as needed for diarrhea or loose stools. 100 tablet 1   losartan (COZAAR) 50 MG tablet TAKE 1 TABLET BY MOUTH EVERY DAY 90 tablet 2   Metamucil Fiber CHEW Chew 2 tablets by mouth in the morning and at bedtime. 100 tablet 11   mirtazapine (REMERON) 30 MG tablet Take 1 tablet (30 mg total) by mouth at bedtime. 30 tablet 5   omega-3 acid ethyl esters (LOVAZA) 1 g capsule Take 2 capsules (2 g total) by mouth 2 (two) times daily. 120 capsule 11   ondansetron (ZOFRAN) 4 MG tablet Take 1 tablet (4 mg  total) by mouth every 8 (eight) hours as needed for nausea or vomiting. 30 tablet 0   OVER THE COUNTER MEDICATION Take 1 capsule by mouth daily. 11 MUSHROOM BLEND     Polyethyl Glycol-Propyl Glycol (SYSTANE) 0.4-0.3 % SOLN Place 1 drop into both eyes 3 (three) times daily as needed (dry eyes). Place 1 drop into both eyes 3 (three) times daily as needed (dry eyes). 3 mL 2   rosuvastatin (CRESTOR) 10 MG tablet Take 1 tablet (10 mg total) by mouth daily. 30 tablet 2   sodium chloride (OCEAN) 0.65 % SOLN nasal spray Place 1 spray into both nostrils as needed for congestion. 30 mL 1   ALPRAZolam (XANAX) 0.5 MG tablet Take 0.5 mg by mouth 4 (four) times daily.     carvedilol (COREG) 12.5 MG tablet TAKE 1 TABLET (12.5MG  TOTAL) BY MOUTH TWICE A DAY WITH MEALS 180 tablet 1    No facility-administered medications prior to visit.    ROS: Review of Systems  Constitutional:  Negative for activity change, appetite change, chills, fatigue and unexpected weight change.  HENT:  Negative for congestion, mouth sores and sinus pressure.   Eyes:  Negative for visual disturbance.  Respiratory:  Negative for cough and chest tightness.   Gastrointestinal:  Negative for abdominal pain and nausea.  Genitourinary:  Negative for difficulty urinating, frequency and vaginal pain.  Musculoskeletal:  Negative for back pain and gait problem.  Skin:  Negative for pallor and rash.  Neurological:  Negative for dizziness, tremors, weakness, numbness and headaches.  Psychiatric/Behavioral:  Negative for confusion and sleep disturbance.     Objective:  BP 134/72   Pulse 82   Temp 98.1 F (36.7 C) (Oral)   Ht 5\' 1"  (1.549 m)   Wt 172 lb 12.8 oz (78.4 kg)   SpO2 95%   BMI 32.65 kg/m   BP Readings from Last 3 Encounters:  04/26/23 134/72  04/21/23 (!) 142/88  03/09/23 130/70    Wt Readings from Last 3 Encounters:  04/26/23 172 lb 12.8 oz (78.4 kg)  04/21/23 171 lb (77.6 kg)  03/09/23 179 lb (81.2 kg)    Physical Exam Constitutional:      General: She is not in acute distress.    Appearance: She is well-developed. She is obese.  HENT:     Head: Normocephalic.     Right Ear: External ear normal.     Left Ear: External ear normal.     Nose: Nose normal.  Eyes:     General:        Right eye: No discharge.        Left eye: No discharge.     Conjunctiva/sclera: Conjunctivae normal.     Pupils: Pupils are equal, round, and reactive to light.  Neck:     Thyroid: No thyromegaly.     Vascular: No JVD.     Trachea: No tracheal deviation.  Cardiovascular:     Rate and Rhythm: Normal rate and regular rhythm.     Heart sounds: Normal heart sounds.  Pulmonary:     Effort: No respiratory distress.     Breath sounds: No stridor. No wheezing.  Abdominal:     General:  Bowel sounds are normal. There is no distension.     Palpations: Abdomen is soft. There is no mass.     Tenderness: There is no abdominal tenderness. There is no guarding or rebound.  Musculoskeletal:        General: No tenderness.  Cervical back: Normal range of motion and neck supple. No rigidity.  Lymphadenopathy:     Cervical: No cervical adenopathy.  Skin:    Findings: No erythema or rash.  Neurological:     Mental Status: Mental status is at baseline.     Cranial Nerves: No cranial nerve deficit.     Motor: No abnormal muscle tone.     Coordination: Coordination normal.     Deep Tendon Reflexes: Reflexes normal.  Psychiatric:        Behavior: Behavior normal.        Thought Content: Thought content normal.        Judgment: Judgment normal.   Using a walker  Lab Results  Component Value Date   WBC 8.3 04/21/2023   HGB 12.1 04/21/2023   HCT 34.5 (L) 04/21/2023   PLT 304.0 04/21/2023   GLUCOSE 83 04/21/2023   CHOL 277 (H) 09/23/2022   TRIG 276 (H) 09/23/2022   HDL 51 09/23/2022   LDLDIRECT 160.0 10/18/2018   LDLCALC 174 (H) 09/23/2022   ALT 18 03/09/2023   AST 21 03/09/2023   NA 136 04/21/2023   K 4.5 04/21/2023   CL 102 04/21/2023   CREATININE 1.21 (H) 04/21/2023   BUN 22 04/21/2023   CO2 27 04/21/2023   TSH 4.32 03/09/2023   INR 1.0 07/15/2020   HGBA1C 5.8 03/01/2022   MICROALBUR 0.8 12/16/2021    No results found.  Assessment & Plan:   Problem List Items Addressed This Visit     Major depressive disorder, recurrent, severe without psychotic features (HCC)     Seeing Dr Donell Beers, Dr Dellia Cloud On Mirtazipine, Buspar, Fluoxetine, Xanax prn      Relevant Medications   ALPRAZolam (XANAX) 0.5 MG tablet   Bipolar affective disorder, current episode depressed (HCC)    F/u w/Dr Plovsky Now Wellbutrin was added  On Prozac      Relevant Medications   ALPRAZolam (XANAX) 0.5 MG tablet   Mild cognitive disorder    Worse - 23/30 MMSE c/w mild  dementia Seeing Dr Marjory Lies  No driving was suggested      Cystitis    Cipro Rx       Other Visit Diagnoses     Need for influenza vaccination    -  Primary   Relevant Orders   Flu Vaccine Trivalent High Dose (Fluad)         Meds ordered this encounter  Medications   ALPRAZolam (XANAX) 0.5 MG tablet    Sig: Take 1 tablet (0.5 mg total) by mouth 3 (three) times daily as needed for anxiety (panic attacks).    Dispense:  90 tablet    Refill:  0   ciprofloxacin (CIPRO) 250 MG tablet    Sig: Take 1 tablet (250 mg total) by mouth 2 (two) times daily for 3 days.    Dispense:  14 tablet    Refill:  0      Follow-up: Return in about 3 months (around 07/25/2023) for a follow-up visit.  Sonda Primes, MD

## 2023-04-26 NOTE — Assessment & Plan Note (Signed)
Cipro Rx

## 2023-04-26 NOTE — Assessment & Plan Note (Addendum)
Worse - 23/30 MMSE c/w mild dementia Seeing Dr Marjory Lies  No driving was suggested

## 2023-04-27 ENCOUNTER — Other Ambulatory Visit: Payer: Self-pay

## 2023-04-27 ENCOUNTER — Ambulatory Visit: Payer: 59 | Admitting: Psychology

## 2023-04-27 DIAGNOSIS — F4321 Adjustment disorder with depressed mood: Secondary | ICD-10-CM

## 2023-04-27 DIAGNOSIS — F4323 Adjustment disorder with mixed anxiety and depressed mood: Secondary | ICD-10-CM

## 2023-04-27 NOTE — Progress Notes (Signed)
[name] Time: 4:10p-5:00 p50 minutes   [name] is a 72 y.o. female patient  Date: 04/27/2023  Treatment Plan Diagnosis F43.21 (Adjustment Disorder with mixed anxiety and depressed mood.  Symptoms A diagnosis of an acute, serious illness that is life-threatening. (Status: maintained) -- No Description Entered  Diminished interest in or enjoyment of activities. (Status: maintained) -- No Description Entered  Lack of energy. (Status: maintained) -- No Description Entered  Sad affect, social withdrawal, anxiety, loss of interest in activities, and low energy. (Status: maintained) -- No Description Entered  Medication Status compliance  Safety unspecified If Suicidal or Homicidal State Action Taken: unspecified  Current Risk:  Medications Buspar (Dosage: 30mg  2X/day)  New Medication (Dosage: 250mg  2x/day)  New Medication (Dosage: 250mg  2x/day)  Prozac (Dosage: 40mg  daily)  Remeron (Dosage: 30mg  nightly)  xanax (Dosage: .5mg  2-3X/day)  Objectives Related Problem: Recognize, accept, and cope with feelings of depression. Description: Identify and replace thoughts and beliefs that support depression. Target Date: 2024-04-28 Frequency: Daily Modality: individual Progress: 80%  Related Problem: Recognize, accept, and cope with feelings of depression. Description: Verbalize an understanding of healthy and unhealthy emotions with the intent of increasing the use of healthy emotions to guide actions. Target Date: 2024-04-28 Frequency: Daily Modality: individual Progress: 85%  Related Problem: Recognize, accept, and cope with feelings of depression. Description: Increasingly verbalize hopeful and positive statements regarding self, others, and the future. Target Date:  2022-04-28 Frequency: Daily Modality: individual Progress: 100%  Related Problem: Reduce fear, anxiety, and worry associated with the medical condition. Description: Share with significant others efforts to adapt successfully to the medical condition/diagnosis. Target Date: 2024-04-28 Frequency: Daily Modality: individual Progress: 85%  Related Problem: Reduce fear, anxiety, and worry associated with the medical condition. Description: Engage in social, productive, and recreational activities that are possible in spite of medical condition. Target Date: 2024-04-28 Frequency: Daily Modality: individual Progress: 90%  Related Problem: Reduce fear, anxiety, and worry associated with the medical condition. Description: Identify the coping skills and sources of emotional support that have been beneficial in the past. Target Date: 2024-04-28 Frequency: Daily Modality: individual Progress: 90%  Client Response full compliance  Service Location Location, [address] [place], [place] [address]  Service Code cpt [phone]  Related past to present  Facilitate problem solving  Identify/label emotions  Validate/empathize  Self care activities  Lifestyle change (exercise, nutrition)  Assess/facilitate readiness to change  Relaxation training  Rationally challenge thoughts or beliefs/cognitive restructuring  Comments  Dx: F43.21  Meds: Prozac, [name], patient is struggling to rebuild her life and adjust to her new circumstances. She suffers a negative self-concept and has little confidence in her judgement. She is seeking to reduce symptoms of anxiety and self-doubt and to re-define satisfa ction moving forward. Therapy will focus on cognitive restructuring to redefine her experiences and focus on those satisfying aspects of her life. Will target  this goal completion for April1, 2021 (completed). She is also wanting to  utilize counseling to adjust and accept her latest diagnosis of cancer. She wants assistance to better manage this news and remain positive throughout treatment. Goal date is 12-22. Patient has made progress, but her medical condition is variable and she requests additional treatment to manage moods throughout her medical ordeal. In addition, she is trying to manage feelings associated with her older son's mental health struggles. Revised goal date is 12-25.   Patient agrees to a video session and is aware of the limitations of this platform. She is at home and I am in office.   [name]: She says she is feeling better and thinks that the Wellbutrin is helping. Met with Dr. Donell Beers yesterday and they discussed assisted living. She had a brain scan and neurologist diagnosed her with mild dementia. They told her she had "shrinkage in frontal lobe", which led to the diagnosis. She feels she will likely lose license. Does not want assisted living. Has a good attitude about this and is not spiraling. States that her father's funeral service was a great celebration of a life well-lived.                                                                                                                                                                                                                                   Cross Jorge Grant Time: 3:15p-4:00p 45 minutes

## 2023-05-02 ENCOUNTER — Ambulatory Visit: Payer: 59 | Admitting: Psychology

## 2023-05-02 ENCOUNTER — Ambulatory Visit: Payer: Self-pay

## 2023-05-02 NOTE — Patient Outreach (Signed)
  Care Coordination   Follow Up Visit Note   05/02/2023 Name: [name] MRN: [id number] DOB: December 22, 1950  [name] is a 72 y.o. year old female who sees Plotnikov, Georgina Quint, MD for primary care. I spoke with  [name] by phone today.  What matters to the patients health and wellness today?  [name] reports primary concern is recent diagnosis of mild dementia. She states she is planning to give up driving and she is feeling a sense of loss of independence. She states [name] from [facility] (no longer involved) gave her paperwork to complete for [place] transportation, but she has not completed paperwork. She expresses that she knows she needs to socialize more, however seems like she is not able to make that move. She also expresses that her anxiety is getting in the way of following through. Patient also expressed neck pain last night and history of neck surgeries(no longer in pain after taking pain medication). She expresses concern regarding dementia as both her parents affected. She states her father passed a couple weeks ago. And her mother continues to reside at [facility].   Goals Addressed             This Visit's Progress    Assist with Health management       Interventions Today    Flowsheet Row Most Recent Value  Chronic Disease   Chronic disease during today's visit Other, Hypertension (HTN)  [adjustment disorder, mixed anxiety/depressed mood.Leiomyosarcoma of uterus]  General Interventions   General Interventions Discussed/Reviewed General Interventions Reviewed, Doctor Visits  [Evaluation of current treatment plan for health condition and patient's adherence to plan.]  Doctor Visits Discussed/Reviewed PCP, Specialist  PCP/Specialist Visits Compliance with follow-up visit  [reviewed upcoming scheduled appointments]  Exercise Interventions   Exercise Discussed/Reviewed Exercise Discussed  Education Interventions    Education Provided Provided Education  [discussed senior resource center and [facility] day program. discussed benefits of socialization]  Provided Verbal Education On Mental Health/Coping with Illness, Medication, When to see the doctor, [facility]  [advised to take medications as prescribed, attend provider appointments as recommended,  take blood pressure and notify provider with questions or concerns or consistently outside of recommended range.]  Mental Health Interventions   Mental Health Discussed/Reviewed Mental Health Discussed, Anxiety, Refer to Social Work for counseling, Refer to Social Work for resources  [options for in home care,anxiety related to health status and Coping strategies, socialization]  Refer to Social Work for counseling regarding Anxiety/Coping  Refer to Social Work for resources regarding Other, Adult Daycare, Assisted Living/Skilled Nursing Facility  [options choices for ongoing care and care in the future]  Nutrition Interventions   Nutrition Discussed/Reviewed Nutrition Reviewed  Pharmacy Interventions   Pharmacy Dicussed/Reviewed Pharmacy Topics Discussed  Safety Interventions   Safety Discussed/Reviewed Safety Reviewed, Fall Risk  [discussed fall prevention and safety strategies]            SDOH assessments and interventions completed:  No  Care Coordination Interventions:  Yes, provided   Follow up plan: Follow up call scheduled for 06/14/23    Encounter Outcome:  Patient Visit Completed   Kathyrn Sheriff, RN, MSN, BSN, CCM Care Management Coordinator [phone]

## 2023-05-02 NOTE — Patient Instructions (Addendum)
Visit Information  Thank you for taking time to visit with me today. Please don't hesitate to contact me if I can be of assistance to you.   Following are the goals we discussed today:  Continue to take medications as prescribed. Continue to attend provider visits as scheduled Continue to eat healthy, lean meats, vegetables, fruits, avoid saturated and transfats Contact provider with health questions or concerns as needed Continue to check blood pressure routinely and contact provider if questions or concerns Clinical social worker is scheduled to call you on 05/04/23 at 2:00 pm   Our next appointment is by telephone on 06/14/23 at 2:00 pm  Please call the care guide team at [phone] if you need to cancel or reschedule your appointment.   If you are experiencing a Mental Health or Behavioral Health Crisis or need someone to talk to, please call the Suicide and Crisis Lifeline: 988 call the [place] National Suicide Prevention Lifeline: 802-340-3335 or TTY: 650-343-3386 TTY [phone]) to talk to a trained counselor   Kathyrn Sheriff, RN, MSN, BSN, CCM Care Management Coordinator [phone]

## 2023-05-03 ENCOUNTER — Ambulatory Visit: Payer: 59 | Admitting: Psychology

## 2023-05-03 DIAGNOSIS — F4321 Adjustment disorder with depressed mood: Secondary | ICD-10-CM | POA: Diagnosis not present

## 2023-05-03 DIAGNOSIS — F4323 Adjustment disorder with mixed anxiety and depressed mood: Secondary | ICD-10-CM

## 2023-05-03 NOTE — Progress Notes (Signed)
[name] Time: 4:10p-5:00 p50 minutes   [name] is a 72 y.o. female patient  Date: 05/03/2023  Treatment Plan Diagnosis F43.21 (Adjustment Disorder with mixed anxiety and depressed mood.  Symptoms A diagnosis of an acute, serious illness that is life-threatening. (Status: maintained) -- No Description Entered  Diminished interest in or enjoyment of activities. (Status: maintained) -- No Description Entered  Lack of energy. (Status: maintained) -- No Description Entered  Sad affect, social withdrawal, anxiety, loss of interest in activities, and low energy. (Status: maintained) -- No Description Entered  Medication Status compliance  Safety unspecified If Suicidal or Homicidal State Action Taken: unspecified  Current Risk:  Medications Buspar (Dosage: 30mg  2X/day)  New Medication (Dosage: 250mg  2x/day)  New Medication (Dosage: 250mg  2x/day)  Prozac (Dosage: 40mg  daily)  Remeron (Dosage: 30mg  nightly)  xanax (Dosage: .5mg  2-3X/day)  Objectives Related Problem: Recognize, accept, and cope with feelings of depression. Description: Identify and replace thoughts and beliefs that support depression. Target Date: 2024-04-28 Frequency: Daily Modality: individual Progress: 80%  Related Problem: Recognize, accept, and cope with feelings of depression. Description: Verbalize an understanding of healthy and unhealthy emotions with the intent of increasing the use of healthy emotions to guide actions. Target Date: 2024-04-28 Frequency: Daily Modality: individual Progress: 85%  Related Problem: Recognize, accept, and cope with feelings of depression. Description: Increasingly verbalize hopeful and positive statements regarding self, others, and the  future. Target Date: 2022-04-28 Frequency: Daily Modality: individual Progress: 100%  Related Problem: Reduce fear, anxiety, and worry associated with the medical condition. Description: Share with significant others efforts to adapt successfully to the medical condition/diagnosis. Target Date: 2024-04-28 Frequency: Daily Modality: individual Progress: 85%  Related Problem: Reduce fear, anxiety, and worry associated with the medical condition. Description: Engage in social, productive, and recreational activities that are possible in spite of medical condition. Target Date: 2024-04-28 Frequency: Daily Modality: individual Progress: 90%  Related Problem: Reduce fear, anxiety, and worry associated with the medical condition. Description: Identify the coping skills and sources of emotional support that have been beneficial in the past. Target Date: 2024-04-28 Frequency: Daily Modality: individual Progress: 90%  Client Response full compliance  Service Location Location, [address] [place], [place] [address]  Service Code cpt [phone]  Related past to present  Facilitate problem solving  Identify/label emotions  Validate/empathize  Self care activities  Lifestyle change (exercise, nutrition)  Assess/facilitate readiness to change  Relaxation training  Rationally challenge thoughts or beliefs/cognitive restructuring  Comments  Dx: F43.21  Meds: Prozac, [name], patient is struggling to rebuild her life and adjust to her new circumstances. She suffers a negative self-concept and has little confidence in her judgement. She is seeking to reduce symptoms of anxiety and self-doubt and to re-define satisfa ction moving forward. Therapy will focus on cognitive restructuring  to redefine her experiences and focus on those satisfying aspects of her life. Will target this goal completion for April1, 2021 (completed). She is  also wanting to utilize counseling to adjust and accept her latest diagnosis of cancer. She wants assistance to better manage this news and remain positive throughout treatment. Goal date is 12-22. Patient has made progress, but her medical condition is variable and she requests additional treatment to manage moods throughout her medical ordeal. In addition, she is trying to manage feelings associated with her older son's mental health struggles. Revised goal date is 12-25.   Patient agrees to a video session and is aware of the limitations of this platform. She is at home and I am in office.   [name]: She states that her dad's funeral was a wonderful celebration of life. She is very pleased with all that was said about her father. [name] talked about her very strong feelings about the medical profession and has thoughts that the pharmaceutical industry is "crooked and cannot be trusted". This causes her to be extra careful with regard to her own care. She is reading "conspiracy" type books that foster her mistrust of the medical system. Did not "challenge" her facts, but encouraged her to seek reliable sources. Her distress is not helpful in her recovery.                                                                                                                                                                                                                                        [name] Time: 12:35p-12:20p 45 minutes

## 2023-05-04 ENCOUNTER — Other Ambulatory Visit: Payer: Self-pay | Admitting: Cardiology

## 2023-05-04 ENCOUNTER — Ambulatory Visit: Payer: Self-pay | Admitting: Licensed Clinical Social Worker

## 2023-05-04 ENCOUNTER — Other Ambulatory Visit: Payer: Self-pay

## 2023-05-04 NOTE — Patient Outreach (Signed)
  Care Coordination   Initial Visit Note   05/04/2023 Name: [name] MRN: [id number] DOB: [birth year]  [name] is a 73 y.o. year old female who sees Plotnikov, Georgina Quint, MD for primary care. I spoke with  [name] by phone today.  What matters to the patients health and wellness today?  Pt Ms. Marks and LCSW A. Felton Clinton completed initial phone visit. Ms. [name] advised of day program options to increase socialization. Discussed safety in the home to reduce fall risk.     Goals Addressed             This Visit's Progress    Care Coordination       Activities and task to complete in order to accomplish goals.   Call your insurance provider for more information about your Enhanced Benefits (assistance with pca options ) Complete SCAT application pt advises she has application at home and will complete  Keep all upcoming appointment discussed today Continue with current therapy provider [facility]          SDOH assessments and interventions completed:  Yes  SDOH Interventions Today    Flowsheet Row Most Recent Value  SDOH Interventions   Food Insecurity Interventions Intervention Not Indicated  Housing Interventions Intervention Not Indicated  Utilities Interventions Intervention Not Indicated       Interventions Today    Flowsheet Row Most Recent Value  Chronic Disease   Chronic disease during today's visit Hypertension (HTN)  General Interventions   General Interventions Discussed/Reviewed [employer] pt of [facility] day program to reduce social isolation.]  Mental Health Interventions   Mental Health Discussed/Reviewed --  [Pt Ms. Marks and LCSW A. Felton Clinton discussed engaging in social activities with [place] rec centers to decrease social isolation.]  Refer to Social Work for counseling regarding Other  [Pt is under the care of Dr. Dellia Cloud for therapy]  Safety Interventions   Safety  Discussed/Reviewed Safety Reviewed, Home Safety  Home Safety Assistive Devices  [Encourage pt to continue using walker when ambulating.]       Care Coordination Interventions:  Yes, provided   Follow up plan: Follow up call scheduled for 05/18/2022    Encounter Outcome:  Patient Visit Completed   Gwyndolyn Saxon MSW, LCSW Licensed Clinical Social Worker  [facility], Population Health Direct Dial: [phone]  Fax: [phone]

## 2023-05-04 NOTE — Patient Instructions (Signed)
Visit Information  Thank you for taking time to visit with me today. Please don't hesitate to contact me if I can be of assistance to you.   Following are the goals we discussed today:   Goals Addressed             This Visit's Progress    Care Coordination       Activities and task to complete in order to accomplish goals.   Call your insurance provider for more information about your Enhanced Benefits (assistance with pca options ) Complete SCAT application pt advises she has application at home and will complete  Keep all upcoming appointment discussed today Continue with current therapy provider [facility]          Our next appointment is by telephone on 05/18/2022 at 1pm  Please call the care guide team at [phone] if you need to cancel or reschedule your appointment.   If you are experiencing a Mental Health or Behavioral Health Crisis or need someone to talk to, please call 911  Patient verbalizes understanding of instructions and care plan provided today and agrees to view in MyChart. Active MyChart status and patient understanding of how to access instructions and care plan via MyChart confirmed with patient.     Telephone follow up appointment with care management team member scheduled for:05/18/22

## 2023-05-05 ENCOUNTER — Ambulatory Visit: Payer: 59 | Admitting: Internal Medicine

## 2023-05-06 ENCOUNTER — Other Ambulatory Visit: Payer: Self-pay

## 2023-05-09 ENCOUNTER — Telehealth: Payer: Self-pay

## 2023-05-10 NOTE — Telephone Encounter (Signed)
I do not understand what to do with this message.  Thank you

## 2023-05-12 ENCOUNTER — Other Ambulatory Visit: Payer: Self-pay

## 2023-05-12 ENCOUNTER — Ambulatory Visit: Payer: 59 | Admitting: Psychology

## 2023-05-12 DIAGNOSIS — F4321 Adjustment disorder with depressed mood: Secondary | ICD-10-CM | POA: Diagnosis not present

## 2023-05-12 DIAGNOSIS — F4323 Adjustment disorder with mixed anxiety and depressed mood: Secondary | ICD-10-CM

## 2023-05-12 NOTE — Progress Notes (Signed)
[name] Time: 4:10p-5:00 p50 minutes   [name] is a 72 y.o. 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  Symptoms A diagnosis of an acute, serious illness that is life-threatening. (Status: maintained) -- No Description Entered  Diminished interest in or enjoyment of activities. (Status: maintained) -- No Description Entered  Lack of energy. (Status: maintained) -- No Description Entered  Sad affect, social withdrawal, anxiety, loss of interest in activities, and low energy. (Status: maintained) -- No Description Entered  Medication Status compliance  Safety unspecified If Suicidal or Homicidal State Action Taken: unspecified  Current Risk:  Medications Buspar (Dosage: 30mg  2X/day)  New Medication (Dosage: 250mg  2x/day)  New Medication (Dosage: 250mg  2x/day)  Prozac (Dosage: 40mg  daily)  Remeron (Dosage: 30mg  nightly)  xanax (Dosage: .5mg  2-3X/day)  Objectives Related Problem: Recognize, accept, and cope with feelings of depression. Description: Identify and replace thoughts and beliefs that support depression. Target Date: 2024-04-28 Frequency: Daily Modality: individual Progress: 80%  Related Problem: Recognize, accept, and cope with feelings of depression. Description: Verbalize an understanding of healthy and unhealthy emotions with the intent of increasing the use of healthy emotions to guide actions. Target Date: 2024-04-28 Frequency: Daily Modality: individual Progress: 85%  Related Problem: Recognize, accept, and cope with feelings of depression. Description: Increasingly verbalize hopeful and positive statements regarding  self, others, and the future. Target Date: 2022-04-28 Frequency: Daily Modality: individual Progress: 100%  Related Problem: Reduce fear, anxiety, and worry associated with the medical condition. Description: Share with significant others efforts to adapt successfully to the medical condition/diagnosis. Target Date: 2024-04-28 Frequency: Daily Modality: individual Progress: 85%  Related Problem: Reduce fear, anxiety, and worry associated with the medical condition. Description: Engage in social, productive, and recreational activities that are possible in spite of medical condition. Target Date: 2024-04-28 Frequency: Daily Modality: individual Progress: 90%  Related Problem: Reduce fear, anxiety, and worry associated with the medical condition. Description: Identify the coping skills and sources of emotional support that have been beneficial in the past. Target Date: 2024-04-28 Frequency: Daily Modality: individual Progress: 90%  Client Response full compliance  Service Location Location, [address] [place], [place] [address]  Service Code cpt [phone]  Related past to present  Facilitate problem solving  Identify/label emotions  Validate/empathize  Self care activities  Lifestyle change (exercise, nutrition)  Assess/facilitate readiness to change  Relaxation training  Rationally challenge thoughts or beliefs/cognitive restructuring  Comments  Dx: F43.21  Meds: Prozac, [name], patient is struggling to rebuild her life and adjust to her new circumstances. She suffers a negative self-concept and has little confidence in her judgement. She is seeking to reduce symptoms of anxiety  and self-doubt and to re-define satisfa ction moving forward. Therapy will focus on cognitive restructuring to redefine her experiences and focus on those satisfying aspects of her life. Will target this goal completion for April1,  2021 (completed). She is also wanting to utilize counseling to adjust and accept her latest diagnosis of cancer. She wants assistance to better manage this news and remain positive throughout treatment. Goal date is 12-22. Patient has made progress, but her medical condition is variable and she requests additional treatment to manage moods throughout her medical ordeal. In addition, she is trying to manage feelings associated with her older son's mental health struggles. Revised goal date is 12-25.   Patient agrees to a video session and is aware of the limitations of this platform. She is at home and I am in office.   [name]: Says she is having a "difficult time". She was alone on Christmas Eve and on Christmas. She did go visit her mother and says it was "wonderful". She cannot, however, escape feeling very alone. She is experiencing very low energy or motivation. She is less attentive to personal hygiene and daily tasks. She is not as depressed as she has bee no the past.                                                                                                                                                                                                                                           [name] Time: 12:40p-1:30p 50 minutes

## 2023-05-13 ENCOUNTER — Other Ambulatory Visit (HOSPITAL_COMMUNITY): Payer: Self-pay

## 2023-05-13 ENCOUNTER — Other Ambulatory Visit: Payer: Self-pay | Admitting: Cardiology

## 2023-05-13 ENCOUNTER — Other Ambulatory Visit: Payer: Self-pay

## 2023-05-13 MED ORDER — ROSUVASTATIN CALCIUM 10 MG PO TABS
10.0000 mg | ORAL_TABLET | Freq: Every day | ORAL | 2 refills | Status: DC
  Filled 2023-05-13: qty 30, 30d supply, fill #0
  Filled 2023-06-01 – 2023-06-15 (×5): qty 30, 30d supply, fill #1
  Filled 2023-06-27 – 2023-07-15 (×3): qty 30, 30d supply, fill #2
  Filled ????-??-??: fill #0

## 2023-05-16 ENCOUNTER — Ambulatory Visit: Payer: 59 | Admitting: Psychology

## 2023-05-16 DIAGNOSIS — F4321 Adjustment disorder with depressed mood: Secondary | ICD-10-CM

## 2023-05-16 DIAGNOSIS — F4323 Adjustment disorder with mixed anxiety and depressed mood: Secondary | ICD-10-CM

## 2023-05-16 NOTE — Progress Notes (Addendum)
[name] is a 72 y.o. 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  Symptoms A diagnosis of an acute, serious illness that is life-threatening. (Status: maintained) -- No Description Entered  Diminished interest in or enjoyment of activities. (Status: maintained) -- No Description Entered  Lack of energy. (Status: maintained) -- No Description Entered  Sad affect, social withdrawal, anxiety, loss of interest in activities, and low energy. (Status: maintained) -- No Description Entered  Medication Status compliance  Safety unspecified If Suicidal or Homicidal State Action Taken: unspecified  Current Risk:  Medications Buspar (Dosage: 30mg  2X/day)  New Medication (Dosage: 250mg  2x/day)  New Medication (Dosage: 250mg  2x/day)  Prozac (Dosage: 40mg  daily)  Remeron (Dosage: 30mg  nightly)  xanax (Dosage: .5mg  2-3X/day)  Objectives Related Problem: Recognize, accept, and cope with feelings of depression. Description: Identify and replace thoughts and beliefs that support depression. Target Date: 2024-04-28 Frequency: Daily Modality: individual Progress: 80%  Related Problem: Recognize, accept, and cope with feelings of depression. Description: Verbalize an understanding of healthy and unhealthy emotions with the intent of increasing the use of healthy emotions to guide actions. Target Date: 2024-04-28 Frequency: Daily Modality: individual Progress: 85%  Related Problem: Recognize, accept, and cope with feelings of depression. Description: Increasingly verbalize hopeful and positive statements regarding self, others, and  the future. Target Date: 2022-04-28 Frequency: Daily Modality: individual Progress: 100%  Related Problem: Reduce fear, anxiety, and worry associated with the medical condition. Description: Share with significant others efforts to adapt successfully to the medical condition/diagnosis. Target Date: 2024-04-28 Frequency: Daily Modality: individual Progress: 85%  Related Problem: Reduce fear, anxiety, and worry associated with the medical condition. Description: Engage in social, productive, and recreational activities that are possible in spite of medical condition. Target Date: 2024-04-28 Frequency: Daily Modality: individual Progress: 90%  Related Problem: Reduce fear, anxiety, and worry associated with the medical condition. Description: Identify the coping skills and sources of emotional support that have been beneficial in the past. Target Date: 2024-04-28 Frequency: Daily Modality: individual Progress: 90%  Client Response full compliance  Service Location Location, [address] [place], [place] [address]  Service Code cpt [phone]  Related past to present  Facilitate problem solving  Identify/label emotions  Validate/empathize  Self care activities  Lifestyle change (exercise, nutrition)  Assess/facilitate readiness to change  Relaxation training  Rationally challenge thoughts or beliefs/cognitive restructuring  Comments  Dx: F43.21  Meds: Prozac, [name], patient is struggling to rebuild her life and adjust to her new circumstances. She suffers a negative self-concept and has little confidence in  her judgement. She is seeking to reduce symptoms of anxiety and self-doubt and to re-define satisfa ction moving forward. Therapy will focus on cognitive restructuring to redefine her experiences and focus on those satisfying aspects of her life. Will target this goal completion for April1, 2021 (completed).  She is also wanting to utilize counseling to adjust and accept her latest diagnosis of cancer. She wants assistance to better manage this news and remain positive throughout treatment. Goal date is 12-22. Patient has made progress, but her medical condition is variable and she requests additional treatment to manage moods throughout her medical ordeal. In addition, she is trying to manage feelings associated with her older son's mental health struggles. Revised goal date is 12-25.   Patient agrees to a video session and is aware of the limitations of this platform. She is at home and I am in office.   [name]: States she is sick and thinks it is another kidney problem. She is mentally and physically exhausted. She will try to drink water and rest before going to the ER or Urgent care. She is trying to remain positive but the discomfort makes it difficult. She reports that she is not depressed, but have little in her life these days that brings her great joy. We talked about setting goals for therapy for the new year and her desire to heal enough to be able to engage in more activity.                                                                                                                                                                                                                                               [name] Time: 4:10p-5:00p 50 minutes

## 2023-05-19 ENCOUNTER — Encounter: Payer: Self-pay | Admitting: Gynecologic Oncology

## 2023-05-19 ENCOUNTER — Ambulatory Visit: Payer: Self-pay | Admitting: Licensed Clinical Social Worker

## 2023-05-19 NOTE — Patient Instructions (Signed)
 Visit Information  Thank you for taking time to visit with me today. Please don't hesitate to contact me if I can be of assistance to you.   Following are the goals we discussed today:   Goals Addressed   None     Our next appointment is by telephone on 06/02/23 at 2:15pm  Please call the care guide team at [phone] if you need to cancel or reschedule your appointment.   If you are experiencing a Mental Health or Behavioral Health Crisis or need someone to talk to, please call 911   Patient verbalizes understanding of instructions and care plan provided today and agrees to view in MyChart. Active MyChart status and patient understanding of how to access instructions and care plan via MyChart confirmed with patient.     Telephone follow up appointment with care management team member scheduled for:06/02/23   Alfonso Rummer MSW, LCSW Licensed Clinical Social Worker  [facility], Population Health Direct Dial: [phone]  Fax: [phone]

## 2023-05-19 NOTE — Patient Outreach (Signed)
  Care Coordination   Follow Up Visit Note   05/19/2023 Name: [name] MRN: [id number] DOB: 30-Aug-1950  [name] is a 73 y.o. year old female who sees Plotnikov, Aleksei V, MD for primary care. I spoke with  [name] Rides by phone today.  What matters to the patients health and wellness today?  LCSW A. Dekota Kirlin and Pt [name] completed follow visit via phone. [name] request pcs services and pt in the home. LCSW A. Clement forwarded request to pcp office.     Goals Addressed   None   **Same goals from previous visit are still active. LCSW A. Clement reviewed goals with [name] however no new goals were added at this time.   SDOH assessments and interventions completed:  Yes  SDOH Interventions Today    Flowsheet Row Most Recent Value  SDOH Interventions   Food Insecurity Interventions Intervention Not Indicated  Housing Interventions Intervention Not Indicated  Utilities Interventions Intervention Not Indicated        Care Coordination Interventions:  Yes, provided  Interventions Today    Flowsheet Row Most Recent Value  Chronic Disease   Chronic disease during today's visit Other  [Pt reports recent dx of UTI currently under treatment w cipro ]  General Interventions   General Interventions Discussed/Reviewed Level of Care  Level of Care Personal Care Services  [Ms. Masters-Grant is requesting assistance with pt and pcs at home.]  Education Interventions   Education Provided Provided Education  [facility] as an affordable option [id number]]  Safety Interventions   Safety Discussed/Reviewed Fall Risk  [Ms. Masters-Grant reports recent fall.]       Follow up plan: Follow up call scheduled for 06/02/2023    Encounter Outcome:  Patient Visit Completed   Alfonso Clement MSW, LCSW Licensed Clinical Social Worker  [facility], Population Health Direct Dial: [phone]  Fax: [phone]

## 2023-05-23 ENCOUNTER — Telehealth: Payer: Self-pay

## 2023-05-23 ENCOUNTER — Other Ambulatory Visit: Payer: Self-pay

## 2023-05-23 NOTE — Telephone Encounter (Signed)
 Patient called in and stated that she had an appointment with Dr Viktoria on 05/26/2023 and that she has the flu and would need to cancel.  I cancelled the appointment. [name] stated the earliest appointment out was in [place].. I advised patient we would call her back around end of March to reschedule.  Patient confirmed and understood.

## 2023-05-24 ENCOUNTER — Ambulatory Visit: Payer: 59 | Admitting: Diagnostic Neuroimaging

## 2023-05-25 ENCOUNTER — Encounter: Payer: Self-pay | Admitting: Internal Medicine

## 2023-05-25 ENCOUNTER — Ambulatory Visit: Payer: 59 | Admitting: Psychology

## 2023-05-25 ENCOUNTER — Telehealth: Payer: 59 | Admitting: Internal Medicine

## 2023-05-25 DIAGNOSIS — C55 Malignant neoplasm of uterus, part unspecified: Secondary | ICD-10-CM | POA: Diagnosis not present

## 2023-05-25 DIAGNOSIS — R2689 Other abnormalities of gait and mobility: Secondary | ICD-10-CM | POA: Diagnosis not present

## 2023-05-25 DIAGNOSIS — F4321 Adjustment disorder with depressed mood: Secondary | ICD-10-CM | POA: Diagnosis not present

## 2023-05-25 DIAGNOSIS — C499 Malignant neoplasm of connective and soft tissue, unspecified: Secondary | ICD-10-CM

## 2023-05-25 DIAGNOSIS — R296 Repeated falls: Secondary | ICD-10-CM | POA: Diagnosis not present

## 2023-05-25 DIAGNOSIS — E039 Hypothyroidism, unspecified: Secondary | ICD-10-CM | POA: Diagnosis not present

## 2023-05-25 DIAGNOSIS — M797 Fibromyalgia: Secondary | ICD-10-CM | POA: Diagnosis not present

## 2023-05-25 DIAGNOSIS — N183 Chronic kidney disease, stage 3 unspecified: Secondary | ICD-10-CM

## 2023-05-25 DIAGNOSIS — F314 Bipolar disorder, current episode depressed, severe, without psychotic features: Secondary | ICD-10-CM

## 2023-05-25 DIAGNOSIS — F4323 Adjustment disorder with mixed anxiety and depressed mood: Secondary | ICD-10-CM

## 2023-05-25 NOTE — Progress Notes (Signed)
 Virtual Visit via Video Note  I connected with [name] on 05/25/23 at  1:40 PM EST by a video enabled telemedicine application and verified that I am speaking with the correct person using two identifiers.   I discussed the limitations of evaluation and management by telemedicine and the availability of in person appointments. The patient expressed understanding and agreed to proceed.  I was located at our [facility] office. The patient was at home. There was no one else present in the visit.  No chief complaint on file.    History of Present Illness:  C/o diarrhea this am - pt took Imodium  - took 4 doses today, better   Review of Systems  Constitutional:  Positive for malaise/fatigue. Negative for fever and weight loss.  HENT:  Negative for congestion.   Respiratory:  Negative for shortness of breath and wheezing.   Cardiovascular:  Negative for palpitations and orthopnea.  Gastrointestinal:  Negative for blood in stool and melena.  Genitourinary:  Negative for dysuria.  Musculoskeletal:  Negative for falls.  Neurological:  Positive for weakness.  Psychiatric/Behavioral:  Positive for depression. Negative for memory loss. The patient is nervous/anxious.      Observations/Objective: The patient appears to be in no acute distress, looks tired  Assessment and Plan:  Problem List Items Addressed This Visit     Leiomyosarcoma of uterus (HCC)   FTT PT at home PCS form was faxed a couple wks ago to [facility] office      Relevant Orders   Ambulatory referral to Home Health   Bipolar affective disorder, current episode depressed (HCC)   F/u w/Dr Plovsky Now Wellbutrin  was added  On Prozac       Fibromyalgia - Primary   Relevant Orders   Ambulatory referral to Home Health   Leiomyosarcoma (HCC)   FTT PT at home PCS form was faxed a couple wks ago to [facility] office      RESOLVED: CKD (chronic kidney disease) stage 3, GFR 30-59 ml/min (HCC)   Continue w/good  hydration Monitor labs/GFR      Hypothyroidism   Cont on Synthroid       Falls frequently   Start In home PT      Relevant Orders   Ambulatory referral to Home Health   Poor balance   Relevant Orders   Ambulatory referral to Home Health     No orders of the defined types were placed in this encounter.    Follow Up Instructions:    I discussed the assessment and treatment plan with the patient. The patient was provided an opportunity to ask questions and all were answered. The patient agreed with the plan and demonstrated an understanding of the instructions.   The patient was advised to call back or seek an in-person evaluation if the symptoms worsen or if the condition fails to improve as anticipated.  I provided face-to-face time during this encounter. We were at different locations.   Marolyn Noel, MD

## 2023-05-25 NOTE — Assessment & Plan Note (Signed)
Cont on Synthroid

## 2023-05-25 NOTE — Assessment & Plan Note (Signed)
 FTT PT at home PCS form was faxed a couple wks ago to [facility] office

## 2023-05-25 NOTE — Assessment & Plan Note (Signed)
 F/u w/Dr Plovsky Now Wellbutrin was added  On Prozac

## 2023-05-25 NOTE — Assessment & Plan Note (Signed)
Continue w/good hydration Monitor labs/GFR

## 2023-05-25 NOTE — Assessment & Plan Note (Signed)
Start In home PT

## 2023-05-25 NOTE — Progress Notes (Addendum)
[name] is a 73 y.o. 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  Symptoms A diagnosis of an acute, serious illness that is life-threatening. (Status: maintained) -- No Description Entered  Diminished interest in or enjoyment of activities. (Status: maintained) -- No Description Entered  Lack of energy. (Status: maintained) -- No Description Entered  Sad affect, social withdrawal, anxiety, loss of interest in activities, and low energy. (Status: maintained) -- No Description Entered  Medication Status compliance  Safety unspecified If Suicidal or Homicidal State Action Taken: unspecified  Current Risk:  Medications Buspar (Dosage: 30mg  2X/day)  New Medication (Dosage: 250mg  2x/day)  New Medication (Dosage: 250mg  2x/day)  Prozac (Dosage: 40mg  daily)  Remeron (Dosage: 30mg  nightly)  xanax (Dosage: .5mg  2-3X/day)  Objectives Related Problem: Recognize, accept, and cope with feelings of depression. Description: Identify and replace thoughts and beliefs that support depression. Target Date: 2024-04-28 Frequency: Daily Modality: individual Progress: 80%  Related Problem: Recognize, accept, and cope with feelings of depression. Description: Verbalize an understanding of healthy and unhealthy emotions with the intent of increasing the use of healthy emotions to guide actions. Target Date: 2024-04-28 Frequency: Daily Modality: individual Progress: 85%  Related Problem: Recognize, accept, and cope with feelings of depression. Description: Increasingly verbalize hopeful and positive statements  regarding self, others, and the future. Target Date: 2022-04-28 Frequency: Daily Modality: individual Progress: 100%  Related Problem: Reduce fear, anxiety, and worry associated with the medical condition. Description: Share with significant others efforts to adapt successfully to the medical condition/diagnosis. Target Date: 2024-04-28 Frequency: Daily Modality: individual Progress: 85%  Related Problem: Reduce fear, anxiety, and worry associated with the medical condition. Description: Engage in social, productive, and recreational activities that are possible in spite of medical condition. Target Date: 2024-04-28 Frequency: Daily Modality: individual Progress: 90%  Related Problem: Reduce fear, anxiety, and worry associated with the medical condition. Description: Identify the coping skills and sources of emotional support that have been beneficial in the past. Target Date: 2024-04-28 Frequency: Daily Modality: individual Progress: 90%  Client Response full compliance  Service Location Location, [address] [place], [place] [address]  Service Code cpt [phone]  Related past to present  Facilitate problem solving  Identify/label emotions  Validate/empathize  Self care activities  Lifestyle change (exercise, nutrition)  Assess/facilitate readiness to change  Relaxation training  Rationally challenge thoughts or beliefs/cognitive restructuring  Comments  Dx: F43.21  Meds: Prozac, [name], patient is struggling to rebuild her life and adjust to her  new circumstances. She suffers a negative self-concept and has little confidence in her judgement. She is seeking to reduce symptoms of anxiety and self-doubt and to re-define satisfa ction moving forward. Therapy will focus on cognitive restructuring to redefine her experiences and focus on those satisfying aspects of her life. Will target this goal completion for  April1, 2021 (completed). She is also wanting to utilize counseling to adjust and accept her latest diagnosis of cancer. She wants assistance to better manage this news and remain positive throughout treatment. Goal date is 12-22. Patient has made progress, but her medical condition is variable and she requests additional treatment to manage moods throughout her medical ordeal. In addition, she is trying to manage feelings associated with her older son's mental health struggles. Revised goal date is 12-25.   Patient agrees to a video session and is aware of the limitations of this platform. She is at home and I am in office.   [name]: She is sick again and is in bed recovering. She is saying she is not depressed but is feeling like she will never get better. She does say she "doesn't care anymore" and has no expectation of things improving. Medical feedback suggests that she may not have much improvement. She had a video appointment with her primary care doctor today. She claims she is taking all of her medications and doing all she can to stay healthy. The order has been place for personal home care, but it has not started yet. They will assist her with bathing, meals, etc.                                                                                                                                                                                                                                                    [name] Time: 3:15p-4:00p 45 minutes

## 2023-05-26 ENCOUNTER — Inpatient Hospital Stay: Payer: 59 | Admitting: Gynecologic Oncology

## 2023-05-26 DIAGNOSIS — C55 Malignant neoplasm of uterus, part unspecified: Secondary | ICD-10-CM

## 2023-05-30 ENCOUNTER — Ambulatory Visit: Payer: 59 | Admitting: Psychology

## 2023-06-01 ENCOUNTER — Other Ambulatory Visit: Payer: Self-pay | Admitting: Internal Medicine

## 2023-06-01 ENCOUNTER — Other Ambulatory Visit: Payer: Self-pay | Admitting: Cardiology

## 2023-06-01 ENCOUNTER — Other Ambulatory Visit: Payer: Self-pay

## 2023-06-02 ENCOUNTER — Ambulatory Visit: Payer: Self-pay | Admitting: Licensed Clinical Social Worker

## 2023-06-02 ENCOUNTER — Other Ambulatory Visit: Payer: Self-pay

## 2023-06-02 ENCOUNTER — Other Ambulatory Visit (HOSPITAL_COMMUNITY): Payer: Self-pay

## 2023-06-02 MED ORDER — BUSPIRONE HCL 30 MG PO TABS
30.0000 mg | ORAL_TABLET | Freq: Two times a day (BID) | ORAL | 3 refills | Status: DC
  Filled 2023-06-02 – 2023-06-15 (×5): qty 60, 30d supply, fill #0
  Filled 2023-06-27 – 2023-07-15 (×3): qty 60, 30d supply, fill #1
  Filled 2023-07-21 – 2023-08-08 (×3): qty 60, 30d supply, fill #2
  Filled 2023-08-31 – 2023-09-13 (×5): qty 60, 30d supply, fill #3

## 2023-06-02 MED ORDER — HYDRALAZINE HCL 10 MG PO TABS
20.0000 mg | ORAL_TABLET | Freq: Three times a day (TID) | ORAL | 1 refills | Status: DC
  Filled 2023-06-02: qty 540, 90d supply, fill #0
  Filled 2023-06-08 – 2023-06-15 (×3): qty 180, 30d supply, fill #0
  Filled 2023-06-27 – 2023-07-12 (×2): qty 180, 30d supply, fill #1

## 2023-06-02 NOTE — Patient Outreach (Signed)
  Care Coordination   Follow Up Visit Note   06/02/2023 Name: [name] MRN: [id number] DOB: 1950/12/18  [name] is a 73 y.o. year old female who sees Plotnikov, Georgina Quint, MD for primary care. I spoke with  [name] by phone today.  What matters to the patients health and wellness today?  Pt requested assistance with home health referral . LCSW A Emma-Lee Oddo contacted [place] [facility] spk with Irving Burton advised referral was not received.  PCP office was contacted spk with Alice Rieger RN reports referral will be faxed to home health vendor.    Goals Addressed             This Visit's Progress    Care Coordination       Activities and task to complete in order to accomplish goals.   Call your insurance provider for more information about your Enhanced Benefits (assistance with pca options ) Complete SCAT application pt advises she has application at home and will complete  Keep all upcoming appointment discussed today Continue with current therapy provider [facility]   Reviewed goals with pt 06/02/2023. Pt is currently working on existing goals.  Pt will spk with son and family to request assistance until home health services start.         SDOH assessments and interventions completed:  Yes  SDOH Interventions Today    Flowsheet Row Most Recent Value  SDOH Interventions   Food Insecurity Interventions Intervention Not Indicated  Transportation Interventions Intervention Not Indicated  Utilities Interventions Intervention Not Indicated        Care Coordination Interventions:  Yes, provided  Interventions Today    Flowsheet Row Most Recent Value  General Interventions   Level of Care Personal Care Services  [LCSW A. Felton Clinton contacted pcp office to inquire about referral send to [facility].]  Education Interventions   Education Provided Provided Education  Provided Verbal Education On Other  [Assured pt referral will be faxed  to [place]. Encourage pt to spk with son and family members to assist with tasks.]  Mental Health Interventions   Mental Health Discussed/Reviewed Mental Health Reviewed, Coping Strategies  [Pt under care of Dr. Dellia Cloud. Encourage pt to continue services with current behavioral health provider.]       Follow up plan: Follow up call scheduled for 06/23/2023    Encounter Outcome:  Patient Visit Completed   Gwyndolyn Saxon MSW, LCSW Licensed Clinical Social Worker  [facility], Population Health Direct Dial: [phone]  Fax: [phone]

## 2023-06-02 NOTE — Patient Instructions (Signed)
Visit Information  Thank you for taking time to visit with me today. Please don't hesitate to contact me if I can be of assistance to you.   Following are the goals we discussed today:   Goals Addressed             This Visit's Progress    Care Coordination       Activities and task to complete in order to accomplish goals.   Call your insurance provider for more information about your Enhanced Benefits (assistance with pca options ) Complete SCAT application pt advises she has application at home and will complete  Keep all upcoming appointment discussed today Continue with current therapy provider [facility]   Reviewed goals with pt 06/02/2023. Pt is currently working on existing goals.  Pt will spk with son and family to request assistance until home health services start.         Our next appointment is by telephone on 06/23/2023 at 2pm  Please call the care guide team at [phone] if you need to cancel or reschedule your appointment.   If you are experiencing a Mental Health or Behavioral Health Crisis or need someone to talk to, please call the Suicide and Crisis Lifeline: 988 go to [facility] [address], [place] [phone]) call 911   Patient verbalizes understanding of instructions and care plan provided today and agrees to view in MyChart. Active MyChart status and patient understanding of how to access instructions and care plan via MyChart confirmed with patient.     Telephone follow up appointment with care management team member scheduled for:06/23/2023  Gwyndolyn Saxon MSW, LCSW Licensed Clinical Social Worker  [facility], Population Health Direct Dial: [phone]  Fax: [phone]

## 2023-06-07 ENCOUNTER — Other Ambulatory Visit: Payer: Self-pay

## 2023-06-07 ENCOUNTER — Other Ambulatory Visit (HOSPITAL_COMMUNITY): Payer: Self-pay

## 2023-06-08 ENCOUNTER — Other Ambulatory Visit: Payer: Self-pay

## 2023-06-08 ENCOUNTER — Other Ambulatory Visit (HOSPITAL_COMMUNITY): Payer: Self-pay

## 2023-06-08 ENCOUNTER — Ambulatory Visit: Payer: 59 | Admitting: Psychology

## 2023-06-08 DIAGNOSIS — F4321 Adjustment disorder with depressed mood: Secondary | ICD-10-CM

## 2023-06-08 DIAGNOSIS — F4323 Adjustment disorder with mixed anxiety and depressed mood: Secondary | ICD-10-CM

## 2023-06-08 NOTE — Progress Notes (Signed)
[name] is a 73 y.o. 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  Symptoms A diagnosis of an acute, serious illness that is life-threatening. (Status: maintained) -- No Description Entered  Diminished interest in or enjoyment of activities. (Status: maintained) -- No Description Entered  Lack of energy. (Status: maintained) -- No Description Entered  Sad affect, social withdrawal, anxiety, loss of interest in activities, and low energy. (Status: maintained) -- No Description Entered  Medication Status compliance  Safety unspecified If Suicidal or Homicidal State Action Taken: unspecified  Current Risk:  Medications Buspar (Dosage: 30mg  2X/day)  New Medication (Dosage: 250mg  2x/day)  New Medication (Dosage: 250mg  2x/day)  Prozac (Dosage: 40mg  daily)  Remeron (Dosage: 30mg  nightly)  xanax (Dosage: .5mg  2-3X/day)  Objectives Related Problem: Recognize, accept, and cope with feelings of depression. Description: Identify and replace thoughts and beliefs that support depression. Target Date: 2024-04-28 Frequency: Daily Modality: individual Progress: 80%  Related Problem: Recognize, accept, and cope with feelings of depression. Description: Verbalize an understanding of healthy and unhealthy emotions with the intent of increasing the use of healthy emotions to guide actions. Target Date: 2024-04-28 Frequency: Daily Modality: individual Progress: 85%  Related Problem: Recognize, accept, and cope with feelings of depression. Description: Increasingly verbalize  hopeful and positive statements regarding self, others, and the future. Target Date: 2022-04-28 Frequency: Daily Modality: individual Progress: 100%  Related Problem: Reduce fear, anxiety, and worry associated with the medical condition. Description: Share with significant others efforts to adapt successfully to the medical condition/diagnosis. Target Date: 2024-04-28 Frequency: Daily Modality: individual Progress: 85%  Related Problem: Reduce fear, anxiety, and worry associated with the medical condition. Description: Engage in social, productive, and recreational activities that are possible in spite of medical condition. Target Date: 2024-04-28 Frequency: Daily Modality: individual Progress: 90%  Related Problem: Reduce fear, anxiety, and worry associated with the medical condition. Description: Identify the coping skills and sources of emotional support that have been beneficial in the past. Target Date: 2024-04-28 Frequency: Daily Modality: individual Progress: 90%  Client Response full compliance  Service Location Location, [address] [place], [place] [address]  Service Code cpt [phone]  Related past to present  Facilitate problem solving  Identify/label emotions  Validate/empathize  Self care activities  Lifestyle change (exercise, nutrition)  Assess/facilitate readiness to change  Relaxation training  Rationally challenge thoughts or beliefs/cognitive restructuring  Comments  Dx: F43.21  Meds: Prozac, Buspar and Remeron.  Goals: Following the failure  of a third marriage, patient is struggling to rebuild her life and adjust to her new circumstances. She suffers a negative self-concept and has little confidence in her judgement. She is seeking to reduce symptoms of anxiety and self-doubt and to re-define satisfa ction moving forward. Therapy will focus on cognitive restructuring to redefine her experiences and focus on those satisfying aspects of her life. Will  target this goal completion for April1, 2021 (completed). She is also wanting to utilize counseling to adjust and accept her latest diagnosis of cancer. She wants assistance to better manage this news and remain positive throughout treatment. Goal date is 12-22. Patient has made progress, but her medical condition is variable and she requests additional treatment to manage moods throughout her medical ordeal. In addition, she is trying to manage feelings associated with her older son's mental health struggles. Revised goal date is 12-25.   Patient agrees to a video session and is aware of the limitations of this platform. She is at home and I am in office.   [name]: She states that she is tired of being sick for so long. She says she is not getting any better and is trying to be optimistic. It is getting more difficult to not give up hope. She is hoping to get to a point where she can get out more as she is tired of being "stuck" at home. She will see Dr. Donell Beers next week to follow-up on the Wellbutrin. She thinks the medicine is helping. [name] talked about her inability to let go of the fear and pain about son [name]. She still feels guilt and regret for many aspects of the relationship. Discussed ways to manage her unresolved feelings.                                                                                                                                                                                                                                                        [name] Time: 3:15p-4:00p 45 minutes

## 2023-06-13 ENCOUNTER — Ambulatory Visit (INDEPENDENT_AMBULATORY_CARE_PROVIDER_SITE_OTHER): Payer: 59 | Admitting: Psychology

## 2023-06-13 DIAGNOSIS — F4321 Adjustment disorder with depressed mood: Secondary | ICD-10-CM

## 2023-06-13 DIAGNOSIS — F4323 Adjustment disorder with mixed anxiety and depressed mood: Secondary | ICD-10-CM

## 2023-06-13 NOTE — Progress Notes (Signed)
[name] is a 73 y.o. 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  Symptoms A diagnosis of an acute, serious illness that is life-threatening. (Status: maintained) -- No Description Entered  Diminished interest in or enjoyment of activities. (Status: maintained) -- No Description Entered  Lack of energy. (Status: maintained) -- No Description Entered  Sad affect, social withdrawal, anxiety, loss of interest in activities, and low energy. (Status: maintained) -- No Description Entered  Medication Status compliance  Safety unspecified If Suicidal or Homicidal State Action Taken: unspecified  Current Risk:  Medications Buspar (Dosage: 30mg  2X/day)  New Medication (Dosage: 250mg  2x/day)  New Medication (Dosage: 250mg  2x/day)  Prozac (Dosage: 40mg  daily)  Remeron (Dosage: 30mg  nightly)  xanax (Dosage: .5mg  2-3X/day)  Objectives Related Problem: Recognize, accept, and cope with feelings of depression. Description: Identify and replace thoughts and beliefs that support depression. Target Date: 2024-04-28 Frequency: Daily Modality: individual Progress: 80%  Related Problem: Recognize, accept, and cope with feelings of depression. Description: Verbalize an understanding of healthy and unhealthy emotions with the intent of increasing the use of healthy emotions to guide actions. Target Date: 2024-04-28 Frequency: Daily Modality: individual Progress: 85%  Related Problem: Recognize, accept, and cope with feelings of depression. Description:  Increasingly verbalize hopeful and positive statements regarding self, others, and the future. Target Date: 2022-04-28 Frequency: Daily Modality: individual Progress: 100%  Related Problem: Reduce fear, anxiety, and worry associated with the medical condition. Description: Share with significant others efforts to adapt successfully to the medical condition/diagnosis. Target Date: 2024-04-28 Frequency: Daily Modality: individual Progress: 85%  Related Problem: Reduce fear, anxiety, and worry associated with the medical condition. Description: Engage in social, productive, and recreational activities that are possible in spite of medical condition. Target Date: 2024-04-28 Frequency: Daily Modality: individual Progress: 90%  Related Problem: Reduce fear, anxiety, and worry associated with the medical condition. Description: Identify the coping skills and sources of emotional support that have been beneficial in the past. Target Date: 2024-04-28 Frequency: Daily Modality: individual Progress: 90%  Client Response full compliance  Service Location Location, [address] [place], [place] [address]  Service Code cpt [phone]  Related past to present  Facilitate problem solving  Identify/label emotions  Validate/empathize  Self care activities  Lifestyle change (exercise, nutrition)  Assess/facilitate readiness to change  Relaxation training  Rationally challenge thoughts or beliefs/cognitive restructuring  Comments  Dx: X91.47  Meds: Prozac, [name], patient is struggling to rebuild her life and adjust to her new circumstances. She suffers a negative self-concept and has little confidence in her judgement. She is seeking to reduce symptoms of anxiety and self-doubt and to re-define satisfa ction moving forward. Therapy will focus on cognitive restructuring to redefine her experiences and focus on those satisfying  aspects of her life. Will target this goal completion for April1, 2021 (completed). She is also wanting to utilize counseling to adjust and accept her latest diagnosis of cancer. She wants assistance to better manage this news and remain positive throughout treatment. Goal date is 12-22. Patient has made progress, but her medical condition is variable and she requests additional treatment to manage moods throughout her medical ordeal. In addition, she is trying to manage feelings associated with her older son's mental health struggles. Revised goal date is 12-25.   Patient agrees to a video session and is aware of the limitations of this platform. She is at home and I am in office.   [name]: Talked about her enthusiasm for the new administration. She was very focused on her opinions and more reluctant to share her current emotional state.  She does say that she is still struggling with her sadness about her son, who she feels will likely not survive his addiction. We talked about putting that in perspective and the need to show herself "grace and forgiveness". She is leaning heavily on her spiritual faith to help her through these difficult times.                                                                                                                                                                                                                                                           Jeanetta Alonzo LEWIS Time: 4:15p-5:00p 45 minutes

## 2023-06-14 ENCOUNTER — Ambulatory Visit: Payer: Self-pay

## 2023-06-14 NOTE — Patient Instructions (Signed)
Visit Information  Thank you for taking time to visit with me today. Please don't hesitate to contact me if I can be of assistance to you.   Following are the goals we discussed today:  Continue to take medications as prescribed. Continue to attend provider visits as scheduled Continue to eat healthy, lean meats, vegetables, fruits, avoid saturated and transfats Contact provider with health questions or concerns as needed Work with therapist as recommended  Our next appointment is by telephone on 07/13/23 at 2;00 pm  Please call the care guide team at [phone] if you need to cancel or reschedule your appointment.   If you are experiencing a Mental Health or Behavioral Health Crisis or need someone to talk to, please call the Suicide and Crisis Lifeline: 988 call the [place] National Suicide Prevention Lifeline: 531-367-7330 or TTY: 854-254-7151 TTY 9844124586) to talk to a trained counselor  Kathyrn Sheriff, RN, MSN, BSN, CCM [place]  [facility], Population Health Case Manager Phone: [phone]

## 2023-06-14 NOTE — Patient Outreach (Signed)
  Care Coordination   Follow Up Visit Note   06/14/2023 Name: [name] MRN: [id number] DOB: Oct 14, 1950  [name] is a 73 y.o. year old female who sees Plotnikov, Georgina Quint, MD for primary care. I spoke with  [name] by phone today.  What matters to the patients health and wellness today?  Patient reports she continues to feel fatigued. She also reports IBS has been causing some diarrhea. Patient reports she is taking medications as prescribed. She reports appetite, but states she is eating at least 3 times/day when she takes her medications. Video visit with primary care provider on 05/25/23. She reports that home health physical therapy will begin on tomorrow.   Goals Addressed             This Visit's Progress    Assist with Health management       Interventions Today    Flowsheet Row Most Recent Value  Chronic Disease   Chronic disease during today's visit Other, Chronic Kidney Disease/End Stage Renal Disease (ESRD), Hypertension (HTN)  General Interventions   General Interventions Discussed/Reviewed General Interventions Reviewed, Doctor Visits  [Evaluation of current treatment plan for health condition and patient's adherence to plan.]  Doctor Visits Discussed/Reviewed PCP, Specialist  PCP/Specialist Visits Compliance with follow-up visit  [reviewed upcoming/scheduled appointments]  Education Interventions   Education Provided Provided Education  Provided Verbal Education On When to see the doctor, Medication, Exercise, Nutrition, Other  [advised patient to take medications as prescribed, attend provider visits as scheduled, work with therapist as recommended, contact provider wtih questions or concern, increase physical activity.]  Mental Health Interventions   Mental Health Discussed/Reviewed Mental Health Reviewed  [encouraged to continue to take medications, attend appointments as scheduled, contact provider as needed]  Nutrition Interventions    Nutrition Discussed/Reviewed Nutrition Reviewed  [encouraged to eat healthy and stay hydrated]  Pharmacy Interventions   Pharmacy Dicussed/Reviewed Pharmacy Topics Reviewed  Safety Interventions   Safety Discussed/Reviewed Safety Discussed, Home Safety  Home Safety Assistive Devices            SDOH assessments and interventions completed:  No  Care Coordination Interventions:     Follow up plan: Follow up call scheduled for 07/13/23    Encounter Outcome:  Patient Visit Completed   Kathyrn Sheriff, RN, MSN, BSN, CCM [place]  [facility], Population Health Case Manager Phone: [phone]

## 2023-06-15 ENCOUNTER — Other Ambulatory Visit: Payer: Self-pay

## 2023-06-15 ENCOUNTER — Other Ambulatory Visit (HOSPITAL_COMMUNITY): Payer: Self-pay

## 2023-06-15 ENCOUNTER — Telehealth: Payer: Self-pay | Admitting: Internal Medicine

## 2023-06-15 DIAGNOSIS — Z9181 History of falling: Secondary | ICD-10-CM | POA: Diagnosis not present

## 2023-06-15 DIAGNOSIS — N183 Chronic kidney disease, stage 3 unspecified: Secondary | ICD-10-CM | POA: Diagnosis not present

## 2023-06-15 DIAGNOSIS — E039 Hypothyroidism, unspecified: Secondary | ICD-10-CM | POA: Diagnosis not present

## 2023-06-15 DIAGNOSIS — I129 Hypertensive chronic kidney disease with stage 1 through stage 4 chronic kidney disease, or unspecified chronic kidney disease: Secondary | ICD-10-CM | POA: Diagnosis not present

## 2023-06-15 DIAGNOSIS — M797 Fibromyalgia: Secondary | ICD-10-CM | POA: Diagnosis not present

## 2023-06-15 DIAGNOSIS — Z7982 Long term (current) use of aspirin: Secondary | ICD-10-CM | POA: Diagnosis not present

## 2023-06-15 NOTE — Telephone Encounter (Unsigned)
Copied from CRM [id number]. Topic: Clinical - Home Health Verbal Orders >> Jun 15, 2023 12:08 PM Myrtice Lauth wrote: Caller/Agency: Maida Sale  Callback Number: [phone] Service Requested: Physical Therapy, OT, Speech , Skilled nursing Frequency: PT request 1 week 4, OT evaluation lack of energy and strength pt not bathing or changing her clothes,  Speech, reporting choking when swallowing, Nursing pt making errors with Medicine Any new concerns about the patient? Yes concerns reports poor appetite, non productive cough, Blood pressure today 160/100 pt forgot to take blood pressure medication. In last couple month pt has been on two different antibiotic, doxycyline, cipro, pt has stop taking both without informing anyone. Has questions about pt's medication Lorstan, pt states she's no longer taking it, nurse would like to confirm. Tramadol wants to confirm if pt should be taking it, states it was not on the list of medications provided by provider. Tylenol 500mg s pt is taking at night time, Need clarification.

## 2023-06-16 ENCOUNTER — Ambulatory Visit: Payer: 59 | Admitting: Cardiology

## 2023-06-16 ENCOUNTER — Telehealth: Payer: Self-pay

## 2023-06-16 NOTE — Telephone Encounter (Signed)
Copied from CRM [id number]. Topic: Clinical - Home Health Verbal Orders >> Jun 16, 2023 10:05 AM Drema Balzarine wrote: Caller/Agency: Rosanne Ashing from [facility] Callback Number: [phone] Service Requested: Physical Therapy Frequency:  Any new concerns about the patient? Yes, calling to report level 2 medication interactions: Carvedilol with CloNIDine & Carvedilol with Ipratropium-albuterol

## 2023-06-16 NOTE — Telephone Encounter (Signed)
I was able to speak with [name] and give verbal orders for Physical Therapy, OT, Speech , Skilled nursing

## 2023-06-17 NOTE — Telephone Encounter (Signed)
 Noted. Thanks.

## 2023-06-20 ENCOUNTER — Other Ambulatory Visit: Payer: Self-pay | Admitting: Cardiology

## 2023-06-20 ENCOUNTER — Other Ambulatory Visit: Payer: Self-pay

## 2023-06-20 ENCOUNTER — Ambulatory Visit: Payer: 59 | Admitting: Internal Medicine

## 2023-06-20 ENCOUNTER — Other Ambulatory Visit (HOSPITAL_COMMUNITY): Payer: Self-pay

## 2023-06-21 ENCOUNTER — Ambulatory Visit: Payer: 59 | Admitting: Psychology

## 2023-06-21 ENCOUNTER — Other Ambulatory Visit (HOSPITAL_COMMUNITY): Payer: Self-pay

## 2023-06-21 DIAGNOSIS — F4323 Adjustment disorder with mixed anxiety and depressed mood: Secondary | ICD-10-CM | POA: Diagnosis not present

## 2023-06-21 MED ORDER — CARVEDILOL 12.5 MG PO TABS
12.5000 mg | ORAL_TABLET | Freq: Two times a day (BID) | ORAL | 2 refills | Status: DC
  Filled 2023-06-27 – 2023-07-15 (×3): qty 60, 30d supply, fill #0
  Filled 2023-07-21 – 2023-08-08 (×3): qty 60, 30d supply, fill #1
  Filled 2023-08-31 – 2023-09-13 (×5): qty 60, 30d supply, fill #2

## 2023-06-21 NOTE — Progress Notes (Signed)
[name] is a 73 y.o. 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  Symptoms A diagnosis of an acute, serious illness that is life-threatening. (Status: maintained) -- No Description Entered  Diminished interest in or enjoyment of activities. (Status: maintained) -- No Description Entered  Lack of energy. (Status: maintained) -- No Description Entered  Sad affect, social withdrawal, anxiety, loss of interest in activities, and low energy. (Status: maintained) -- No Description Entered  Medication Status compliance  Safety unspecified If Suicidal or Homicidal State Action Taken: unspecified  Current Risk:  Medications Buspar (Dosage: 30mg  2X/day)  New Medication (Dosage: 250mg  2x/day)  New Medication (Dosage: 250mg  2x/day)  Prozac (Dosage: 40mg  daily)  Remeron (Dosage: 30mg  nightly)  xanax (Dosage: .5mg  2-3X/day)  Objectives Related Problem: Recognize, accept, and cope with feelings of depression. Description: Identify and replace thoughts and beliefs that support depression. Target Date: 2024-04-28 Frequency: Daily Modality: individual Progress: 80%  Related Problem: Recognize, accept, and cope with feelings of depression. Description: Verbalize an understanding of healthy and unhealthy emotions with the intent of increasing the use of healthy emotions to guide actions. Target Date: 2024-04-28 Frequency: Daily Modality: individual Progress: 85%  Related Problem: Recognize, accept, and cope with feelings  of depression. Description: Increasingly verbalize hopeful and positive statements regarding self, others, and the future. Target Date: 2022-04-28 Frequency: Daily Modality: individual Progress: 100%  Related Problem: Reduce fear, anxiety, and worry associated with the medical condition. Description: Share with significant others efforts to adapt successfully to the medical condition/diagnosis. Target Date: 2024-04-28 Frequency: Daily Modality: individual Progress: 85%  Related Problem: Reduce fear, anxiety, and worry associated with the medical condition. Description: Engage in social, productive, and recreational activities that are possible in spite of medical condition. Target Date: 2024-04-28 Frequency: Daily Modality: individual Progress: 90%  Related Problem: Reduce fear, anxiety, and worry associated with the medical condition. Description: Identify the coping skills and sources of emotional support that have been beneficial in the past. Target Date: 2024-04-28 Frequency: Daily Modality: individual Progress: 90%  Client Response full compliance  Service Location Location, [address] [place], [place] [address]  Service Code cpt [phone]  Related past to present  Facilitate problem solving  Identify/label emotions  Validate/empathize  Self care activities  Lifestyle change (exercise, nutrition)  Assess/facilitate readiness to change  Relaxation training  Rationally challenge thoughts or beliefs/cognitive restructuring  Comments  Dx: F43.21  Meds: Prozac, [name], patient is struggling to rebuild her life and adjust to her new circumstances. She suffers a negative self-concept and has little confidence in her judgement. She is seeking to reduce symptoms of anxiety and self-doubt and to re-define satisfa ction moving forward. Therapy will focus on cognitive restructuring to redefine her experiences and  focus on those satisfying aspects of her life. Will target this goal completion for April1, 2021 (completed). She is also wanting to utilize counseling to adjust and accept her latest diagnosis of cancer. She wants assistance to better manage this news and remain positive throughout treatment. Goal date is 12-22. Patient has made progress, but her medical condition is variable and she requests additional treatment to manage moods throughout her medical ordeal. In addition, she is trying to manage feelings associated with her older son's mental health struggles. Revised goal date is 12-25.   Patient agrees to a video session and is aware of the limitations of this platform. She is at home and I am in office.   [name]: Says she is worn out both physically and mentally. Will see Dr. Elson Areas next week and wants to see if she is anemic. Feels fatigued all the time. She is starting to have less optimism about getting healthy. She has visited mother with her uncle. Getting out of the house has been very positive for her. Not speaking to [name] as much as she would like, but says he is supportive of her. She continues to express gratitude, but is also feeling lonely and somewhat depressed. Feels that her depression is mostly situational in that she has no energy or drive. Talked about realistic goals and how to achieve.                                                                                                                                                                                                                                                              [name] Time: 8:40a-9:30a 50 minutes

## 2023-06-22 ENCOUNTER — Ambulatory Visit: Payer: 59 | Admitting: Psychology

## 2023-06-22 DIAGNOSIS — I129 Hypertensive chronic kidney disease with stage 1 through stage 4 chronic kidney disease, or unspecified chronic kidney disease: Secondary | ICD-10-CM | POA: Diagnosis not present

## 2023-06-22 DIAGNOSIS — Z9181 History of falling: Secondary | ICD-10-CM | POA: Diagnosis not present

## 2023-06-22 DIAGNOSIS — Z7982 Long term (current) use of aspirin: Secondary | ICD-10-CM | POA: Diagnosis not present

## 2023-06-22 DIAGNOSIS — N183 Chronic kidney disease, stage 3 unspecified: Secondary | ICD-10-CM | POA: Diagnosis not present

## 2023-06-22 DIAGNOSIS — E039 Hypothyroidism, unspecified: Secondary | ICD-10-CM | POA: Diagnosis not present

## 2023-06-22 DIAGNOSIS — M797 Fibromyalgia: Secondary | ICD-10-CM | POA: Diagnosis not present

## 2023-06-23 ENCOUNTER — Ambulatory Visit: Payer: 59 | Admitting: Internal Medicine

## 2023-06-23 ENCOUNTER — Ambulatory Visit: Payer: Self-pay | Admitting: Licensed Clinical Social Worker

## 2023-06-23 ENCOUNTER — Encounter: Payer: Self-pay | Admitting: Licensed Clinical Social Worker

## 2023-06-23 ENCOUNTER — Other Ambulatory Visit (HOSPITAL_COMMUNITY): Payer: Self-pay

## 2023-06-23 NOTE — Patient Outreach (Signed)
  Care Coordination   Follow Up Visit Note   06/23/2023 Name: [name] MRN: [id number] DOB: 09/02/1950  [name] is a 73 y.o. year old female who sees Plotnikov, Aleksei V, MD for primary care. I spoke with  [name] by phone today.  What matters to the patients health and wellness today?  Completed follow up with pt. [name] reports improvement in mood.     Goals Addressed             This Visit's Progress    COMPLETED: Care Coordination       Activities and task to complete in order to accomplish goals.   Call your insurance provider for more information about your Enhanced Benefits (assistance with pca options ) Complete SCAT application pt advises she has application at home and will complete  Keep all upcoming appointment discussed today Continue with current therapy provider [facility]   Reviewed goals with pt 06/02/2023. Pt is currently working on existing goals.  Pt will spk with son and family to request assistance until home health services start.  Goals reviewed 06/23/2023        SDOH assessments and interventions completed:  No SDOH completed 06/02/2023 Care Coordination Interventions:  Yes, provided  Interventions Today    Flowsheet Row Most Recent Value  Mental Health Interventions   Mental Health Discussed/Reviewed Mental Health Reviewed, Other  [[name] reports improvement in mood and continuing to see Dr. Nunzio       Follow up plan: No further intervention required.   Encounter Outcome:  Patient Visit Completed  Alfonso Rummer MSW, LCSW Licensed Clinical Social Worker  [facility], Population Health Direct Dial: [phone]  Fax: [phone]

## 2023-06-23 NOTE — Patient Instructions (Signed)
 Visit Information  Thank you for taking time to visit with me today. Please don't hesitate to contact me if I can be of assistance to you.   Following are the goals we discussed today:   Goals Addressed             This Visit's Progress    COMPLETED: Care Coordination       Activities and task to complete in order to accomplish goals.   Call your insurance provider for more information about your Enhanced Benefits (assistance with pca options ) Complete SCAT application pt advises she has application at home and will complete  Keep all upcoming appointment discussed today Continue with current therapy provider [facility]   Reviewed goals with pt 06/02/2023. Pt is currently working on existing goals.  Pt will spk with son and family to request assistance until home health services start.  Goals reviewed 06/23/2023         Please call the care guide team at [phone] if you need to cancel or reschedule your appointment.   If you are experiencing a Mental Health or Behavioral Health Crisis or need someone to talk to, please call 911   Patient verbalizes understanding of instructions and care plan provided today and agrees to view in MyChart. Active MyChart status and patient understanding of how to access instructions and care plan via MyChart confirmed with patient.     The patient has been provided with contact information for the care management team and has been advised to call with any health related questions or concerns.   Alfonso Rummer MSW, LCSW Licensed Clinical Social Worker  [facility], Population Health Direct Dial: [phone]  Fax: [phone]

## 2023-06-24 ENCOUNTER — Other Ambulatory Visit (HOSPITAL_COMMUNITY): Payer: Self-pay

## 2023-06-24 ENCOUNTER — Other Ambulatory Visit: Payer: Self-pay | Admitting: Internal Medicine

## 2023-06-27 ENCOUNTER — Other Ambulatory Visit: Payer: Self-pay | Admitting: Cardiology

## 2023-06-27 ENCOUNTER — Other Ambulatory Visit: Payer: Self-pay

## 2023-06-27 ENCOUNTER — Ambulatory Visit: Payer: 59 | Admitting: Psychology

## 2023-06-27 DIAGNOSIS — C55 Malignant neoplasm of uterus, part unspecified: Secondary | ICD-10-CM | POA: Diagnosis not present

## 2023-06-27 DIAGNOSIS — M797 Fibromyalgia: Secondary | ICD-10-CM | POA: Diagnosis not present

## 2023-06-27 DIAGNOSIS — F4321 Adjustment disorder with depressed mood: Secondary | ICD-10-CM | POA: Diagnosis not present

## 2023-06-27 DIAGNOSIS — Z7982 Long term (current) use of aspirin: Secondary | ICD-10-CM | POA: Diagnosis not present

## 2023-06-27 DIAGNOSIS — E039 Hypothyroidism, unspecified: Secondary | ICD-10-CM | POA: Diagnosis not present

## 2023-06-27 DIAGNOSIS — N183 Chronic kidney disease, stage 3 unspecified: Secondary | ICD-10-CM | POA: Diagnosis not present

## 2023-06-27 DIAGNOSIS — I10 Essential (primary) hypertension: Secondary | ICD-10-CM

## 2023-06-27 DIAGNOSIS — F314 Bipolar disorder, current episode depressed, severe, without psychotic features: Secondary | ICD-10-CM

## 2023-06-27 DIAGNOSIS — Z79899 Other long term (current) drug therapy: Secondary | ICD-10-CM

## 2023-06-27 DIAGNOSIS — I129 Hypertensive chronic kidney disease with stage 1 through stage 4 chronic kidney disease, or unspecified chronic kidney disease: Secondary | ICD-10-CM | POA: Diagnosis not present

## 2023-06-27 DIAGNOSIS — Z9181 History of falling: Secondary | ICD-10-CM | POA: Diagnosis not present

## 2023-06-27 DIAGNOSIS — F4323 Adjustment disorder with mixed anxiety and depressed mood: Secondary | ICD-10-CM

## 2023-06-27 NOTE — Progress Notes (Signed)
 [name] is a 73 y.o. 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  Symptoms A diagnosis of an acute, serious illness that is life-threatening. (Status: maintained) -- No Description Entered  Diminished interest in or enjoyment of activities. (Status: maintained) -- No Description Entered  Lack of energy. (Status: maintained) -- No Description Entered  Sad affect, social withdrawal, anxiety, loss of interest in activities, and low energy. (Status: maintained) -- No Description Entered  Medication Status compliance  Safety unspecified If Suicidal or Homicidal State Action Taken: unspecified  Current Risk:  Medications Buspar  (Dosage: 30mg  2X/day)  New Medication (Dosage: 250mg  2x/day)  New Medication (Dosage: 250mg  2x/day)  Prozac  (Dosage: 40mg  daily)  Remeron  (Dosage: 30mg  nightly)  xanax  (Dosage: .5mg  2-3X/day)  Objectives Related Problem: Recognize, accept, and cope with feelings of depression. Description: Identify and replace thoughts and beliefs that support depression. Target Date: 2024-04-28 Frequency: Daily Modality: individual Progress: 80%  Related Problem: Recognize, accept, and cope with feelings of depression. Description: Verbalize an understanding of healthy and unhealthy emotions with the intent of increasing the use of healthy emotions to guide actions. Target Date: 2024-04-28 Frequency: Daily Modality: individual Progress: 85%  Related Problem: Recognize,  accept, and cope with feelings of depression. Description: Increasingly verbalize hopeful and positive statements regarding self, others, and the future. Target Date: 2022-04-28 Frequency: Daily Modality: individual Progress: 100%  Related Problem: Reduce fear, anxiety, and worry associated with the medical condition. Description: Share with significant others efforts to adapt successfully to the medical condition/diagnosis. Target Date: 2024-04-28 Frequency: Daily Modality: individual Progress: 85%  Related Problem: Reduce fear, anxiety, and worry associated with the medical condition. Description: Engage in social, productive, and recreational activities that are possible in spite of medical condition. Target Date: 2024-04-28 Frequency: Daily Modality: individual Progress: 90%  Related Problem: Reduce fear, anxiety, and worry associated with the medical condition. Description: Identify the coping skills and sources of emotional support that have been beneficial in the past. Target Date: 2024-04-28 Frequency: Daily Modality: individual Progress: 90%  Client Response full compliance  Service Location Location, [address] [place], [place] [address]  Service Code cpt [phone]  Related past to present  Facilitate problem solving  Identify/label emotions  Validate/empathize  Self care activities  Lifestyle change (exercise, nutrition)  Assess/facilitate readiness to change  Relaxation training  Rationally challenge thoughts or beliefs/cognitive restructuring  Comments  Dx: F43.21  Meds: Prozac , Buspar  and Remeron .  Goals: Following the failure of a third marriage, patient is struggling to rebuild her life and adjust to her new circumstances. She suffers a negative self-concept and has little confidence in her judgement. She is seeking to reduce symptoms of anxiety and self-doubt and to re-define satisfa ction moving forward. Therapy will focus on cognitive restructuring  to redefine her experiences and focus on those satisfying aspects of her life. Will target this goal completion for April1, 2021 (completed). She is also wanting to utilize counseling to adjust and accept her latest diagnosis of cancer. She wants assistance to better manage this news and remain positive throughout treatment. Goal date is 12-22. Patient has made progress, but her medical condition is variable and she requests additional treatment to manage moods throughout her medical ordeal. In addition, she is trying to manage feelings associated with her older son's mental health struggles. Revised goal date is 12-25.   Patient agrees to a video session and is aware of the limitations of this platform. She is at home and I am in office.   [name]: She talked about visiting her mother, which was a very positive experience. Says that her mother is comfortable and not complaining. Mother's dementia is "about the same". She talked about her positive memories of her parents and children. She expressed considerable gratitude for her early life experiences. While she has positive memories for that period of her life, she feels her adult life had many errors and poor decisions that has made life more difficult. She does feel some sadness and despair these days, mostly related to her physical impairments. Her biggest regrets have to do with the children's exposure to a bad marriage. We talked about self forgiveness.                                                                                                                                                                                                                                                                 [name] Time: 8:40a-9:30a 50 minutes

## 2023-06-28 ENCOUNTER — Other Ambulatory Visit: Payer: Self-pay

## 2023-06-28 ENCOUNTER — Other Ambulatory Visit (HOSPITAL_COMMUNITY): Payer: Self-pay

## 2023-06-28 MED ORDER — CLONIDINE HCL 0.2 MG PO TABS
0.2000 mg | ORAL_TABLET | Freq: Every day | ORAL | 0 refills | Status: DC
  Filled 2023-07-12 – 2023-07-15 (×2): qty 30, 30d supply, fill #0

## 2023-06-29 ENCOUNTER — Other Ambulatory Visit: Payer: Self-pay | Admitting: Internal Medicine

## 2023-06-29 ENCOUNTER — Ambulatory Visit: Payer: 59

## 2023-06-29 ENCOUNTER — Other Ambulatory Visit: Payer: Self-pay

## 2023-06-29 ENCOUNTER — Telehealth: Payer: Self-pay | Admitting: Internal Medicine

## 2023-06-29 ENCOUNTER — Other Ambulatory Visit (HOSPITAL_COMMUNITY): Payer: Self-pay

## 2023-06-29 NOTE — Telephone Encounter (Unsigned)
Copied from CRM [id number]. Topic: Clinical - Medication Refill >> Jun 29, 2023 12:58 PM Thomes Dinning wrote: Most Recent Primary Care Visit:  Provider: Tresa Garter  Department: [facility]  Visit Type: MYCHART VIDEO VISIT  Date: 05/25/2023  Medication: ALPRAZolam Prudy Feeler) 1 MG tablet  Has the patient contacted their pharmacy? Yes (Agent: If no, request that the patient contact the pharmacy for the refill. If patient does not wish to contact the pharmacy document the reason why and proceed with request.) (Agent: If yes, when and what did the pharmacy advise?)  Is this the correct pharmacy for this prescription? Yes If no, delete pharmacy and type the correct one.  This is the patient's preferred pharmacy:  [facility] [address] [place] [address] Phone: [phone] Fax: [phone]  [facility] [address], [place] [address] [place] [address] Phone: [phone] Fax: [phone]   Has the prescription been filled recently? No  Is the patient out of the medication? Yes  Has the patient been seen for an appointment in the last year OR does the patient have an upcoming appointment? Yes  Can we respond through MyChart? Yes  Agent: Please be advised that Rx refills may take up to 3 business days. We ask that you follow-up with your pharmacy.

## 2023-06-29 NOTE — Telephone Encounter (Signed)
Copied from CRM [id number]. Topic: General - Other >> Jun 29, 2023 10:40 AM Elizebeth Brooking wrote: Reason for CRM: Burna Mortimer from [facility] care regarding orders on patients and forms  25 order skilled nursing order [id number] 25 order ocupational therapy [id number] Home Health Cerification and plan of care 01/29to 03/29 Order Number [id number]  Would like them back signed and dated

## 2023-06-29 NOTE — Telephone Encounter (Signed)
Paperwork has been signed and faxed.

## 2023-06-30 ENCOUNTER — Other Ambulatory Visit: Payer: Self-pay | Admitting: Internal Medicine

## 2023-06-30 ENCOUNTER — Other Ambulatory Visit: Payer: Self-pay

## 2023-06-30 ENCOUNTER — Other Ambulatory Visit (HOSPITAL_COMMUNITY): Payer: Self-pay

## 2023-06-30 DIAGNOSIS — I129 Hypertensive chronic kidney disease with stage 1 through stage 4 chronic kidney disease, or unspecified chronic kidney disease: Secondary | ICD-10-CM | POA: Diagnosis not present

## 2023-06-30 DIAGNOSIS — Z7982 Long term (current) use of aspirin: Secondary | ICD-10-CM | POA: Diagnosis not present

## 2023-06-30 DIAGNOSIS — E039 Hypothyroidism, unspecified: Secondary | ICD-10-CM | POA: Diagnosis not present

## 2023-06-30 DIAGNOSIS — N183 Chronic kidney disease, stage 3 unspecified: Secondary | ICD-10-CM | POA: Diagnosis not present

## 2023-06-30 DIAGNOSIS — Z9181 History of falling: Secondary | ICD-10-CM | POA: Diagnosis not present

## 2023-06-30 DIAGNOSIS — M797 Fibromyalgia: Secondary | ICD-10-CM | POA: Diagnosis not present

## 2023-06-30 NOTE — Telephone Encounter (Unsigned)
Copied from CRM [id number]. Topic: Clinical - Medication Refill >> Jun 30, 2023 10:33 AM Corin V wrote: Most Recent Primary Care Visit:  Provider: Tresa Garter  Department: [facility]  Visit Type: MYCHART VIDEO VISIT  Date: 05/25/2023  Medication: ALPRAZolam Prudy Feeler) 1 MG tablet  Has the patient contacted their pharmacy? Yes (Agent: If no, request that the patient contact the pharmacy for the refill. If patient does not wish to contact the pharmacy document the reason why and proceed with request.) (Agent: If yes, when and what did the pharmacy advise?)  Is this the correct pharmacy for this prescription? Yes If no, delete pharmacy and type the correct one.  This is the patient's preferred pharmacy:  [facility] [address] [place] [address] Phone: [phone] Fax: [phone]  Has the prescription been filled recently? No  Is the patient out of the medication? Yes  Has the patient been seen for an appointment in the last year OR does the patient have an upcoming appointment? Yes  Can we respond through MyChart? Yes  Agent: Please be advised that Rx refills may take up to 3 business days. We ask that you follow-up with your pharmacy.

## 2023-07-01 ENCOUNTER — Other Ambulatory Visit: Payer: Self-pay

## 2023-07-01 ENCOUNTER — Other Ambulatory Visit (HOSPITAL_COMMUNITY): Payer: Self-pay

## 2023-07-01 MED ORDER — ALPRAZOLAM 1 MG PO TABS
1.0000 mg | ORAL_TABLET | Freq: Three times a day (TID) | ORAL | 1 refills | Status: DC | PRN
  Filled 2023-07-01 (×2): qty 90, 30d supply, fill #0
  Filled 2023-07-28: qty 90, 30d supply, fill #1

## 2023-07-05 ENCOUNTER — Other Ambulatory Visit (HOSPITAL_COMMUNITY): Payer: Self-pay

## 2023-07-05 DIAGNOSIS — N183 Chronic kidney disease, stage 3 unspecified: Secondary | ICD-10-CM | POA: Diagnosis not present

## 2023-07-05 DIAGNOSIS — E039 Hypothyroidism, unspecified: Secondary | ICD-10-CM | POA: Diagnosis not present

## 2023-07-05 DIAGNOSIS — Z7982 Long term (current) use of aspirin: Secondary | ICD-10-CM | POA: Diagnosis not present

## 2023-07-05 DIAGNOSIS — I129 Hypertensive chronic kidney disease with stage 1 through stage 4 chronic kidney disease, or unspecified chronic kidney disease: Secondary | ICD-10-CM | POA: Diagnosis not present

## 2023-07-05 DIAGNOSIS — Z9181 History of falling: Secondary | ICD-10-CM | POA: Diagnosis not present

## 2023-07-05 DIAGNOSIS — M797 Fibromyalgia: Secondary | ICD-10-CM | POA: Diagnosis not present

## 2023-07-06 ENCOUNTER — Other Ambulatory Visit (HOSPITAL_COMMUNITY): Payer: Self-pay

## 2023-07-06 ENCOUNTER — Ambulatory Visit: Payer: 59 | Admitting: Psychology

## 2023-07-06 ENCOUNTER — Other Ambulatory Visit (HOSPITAL_BASED_OUTPATIENT_CLINIC_OR_DEPARTMENT_OTHER): Payer: Self-pay

## 2023-07-06 DIAGNOSIS — F4321 Adjustment disorder with depressed mood: Secondary | ICD-10-CM | POA: Diagnosis not present

## 2023-07-06 DIAGNOSIS — F4323 Adjustment disorder with mixed anxiety and depressed mood: Secondary | ICD-10-CM

## 2023-07-06 MED ORDER — BUPROPION HCL ER (XL) 150 MG PO TB24
150.0000 mg | ORAL_TABLET | Freq: Every morning | ORAL | 3 refills | Status: DC
  Filled 2023-07-06: qty 30, 30d supply, fill #0
  Filled 2023-08-01: qty 30, 30d supply, fill #1
  Filled 2023-08-31: qty 30, 30d supply, fill #2
  Filled 2023-09-01 – 2023-09-02 (×2): qty 8, 8d supply, fill #2
  Filled 2023-09-15 (×2): qty 22, 22d supply, fill #3

## 2023-07-06 NOTE — Progress Notes (Signed)
[name] is a 73 y.o. 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  Symptoms A diagnosis of an acute, serious illness that is life-threatening. (Status: maintained) -- No Description Entered  Diminished interest in or enjoyment of activities. (Status: maintained) -- No Description Entered  Lack of energy. (Status: maintained) -- No Description Entered  Sad affect, social withdrawal, anxiety, loss of interest in activities, and low energy. (Status: maintained) -- No Description Entered  Medication Status compliance  Safety unspecified If Suicidal or Homicidal State Action Taken: unspecified  Current Risk:  Medications Buspar (Dosage: 30mg  2X/day)  New Medication (Dosage: 250mg  2x/day)  New Medication (Dosage: 250mg  2x/day)  Prozac (Dosage: 40mg  daily)  Remeron (Dosage: 30mg  nightly)  xanax (Dosage: .5mg  2-3X/day)  Objectives Related Problem: Recognize, accept, and cope with feelings of depression. Description: Identify and replace thoughts and beliefs that support depression. Target Date: 2024-04-28 Frequency: Daily Modality: individual Progress: 80%  Related Problem: Recognize, accept, and cope with feelings of depression. Description: Verbalize an understanding of healthy and unhealthy emotions with the intent of increasing the use of healthy emotions to guide actions. Target Date: 2024-04-28 Frequency: Daily Modality: individual Progress: 85%   Related Problem: Recognize, accept, and cope with feelings of depression. Description: Increasingly verbalize hopeful and positive statements regarding self, others, and the future. Target Date: 2022-04-28 Frequency: Daily Modality: individual Progress: 100%  Related Problem: Reduce fear, anxiety, and worry associated with the medical condition. Description: Share with significant others efforts to adapt successfully to the medical condition/diagnosis. Target Date: 2024-04-28 Frequency: Daily Modality: individual Progress: 85%  Related Problem: Reduce fear, anxiety, and worry associated with the medical condition. Description: Engage in social, productive, and recreational activities that are possible in spite of medical condition. Target Date: 2024-04-28 Frequency: Daily Modality: individual Progress: 90%  Related Problem: Reduce fear, anxiety, and worry associated with the medical condition. Description: Identify the coping skills and sources of emotional support that have been beneficial in the past. Target Date: 2024-04-28 Frequency: Daily Modality: individual Progress: 90%  Client Response full compliance  Service Location Location, [address] [place], [place]  [address]  Service Code cpt [phone]  Related past to present  Facilitate problem solving  Identify/label emotions  Validate/empathize  Self care activities  Lifestyle change (exercise, nutrition)  Assess/facilitate readiness to change  Relaxation training  Rationally challenge thoughts or beliefs/cognitive restructuring  Comments  Dx: F43.21  Meds: Prozac, [name], patient is struggling to rebuild her life and adjust to her new circumstances. She suffers a negative self-concept and has little confidence in her judgement. She is seeking to reduce symptoms of anxiety and self-doubt and to re-define satisfa ction moving forward. Therapy will  focus on cognitive restructuring to redefine her experiences and focus on those satisfying aspects of her life. Will target this goal completion for April1, 2021 (completed). She is also wanting to utilize counseling to adjust and accept her latest diagnosis of cancer. She wants assistance to better manage this news and remain positive throughout treatment. Goal date is 12-22. Patient has made progress, but her medical condition is variable and she requests additional treatment to manage moods throughout her medical ordeal. In addition, she is trying to manage feelings associated with her older son's mental health struggles. Revised goal date is 12-25.   Patient agrees to a video session and is aware of the limitations of this platform. She is at home and I am in office.   [name]: She states that she has "no energy" and feels mentally and physically worn out. [facility] and she will be evaluated. Will also meet with pharmacist to review her meds. She is having trouble concentrating enough to read. Instead, she mostly watches television. She finds it helpful to stay busy with house chores and calling friends.                                                                                                                                                                                                                                                                     Sevilla Murtagh LEWIS Time: 3:15p-4:00p 45 minutes

## 2023-07-07 ENCOUNTER — Other Ambulatory Visit: Payer: Self-pay

## 2023-07-07 ENCOUNTER — Telehealth: Payer: Self-pay

## 2023-07-07 ENCOUNTER — Other Ambulatory Visit (HOSPITAL_COMMUNITY): Payer: Self-pay

## 2023-07-07 NOTE — Telephone Encounter (Signed)
Copied from CRM [id number]. Topic: Clinical - Home Health Verbal Orders >> Jul 07, 2023  4:15 PM Drema Balzarine wrote:  Caller/Agency: [facility]  Callback Number: [phone] Service Requested: Physical Therapy Frequency:  Any new concerns about the patient? Yes, patient was suppose to have visit today and canceled because she wasn't feeling well - headache, elevated blood pressure -she consulted with Cardiology and has an appt on Tuesday .... Will see patients next week to make up for the visit

## 2023-07-08 ENCOUNTER — Ambulatory Visit: Payer: Self-pay | Admitting: Internal Medicine

## 2023-07-08 ENCOUNTER — Other Ambulatory Visit: Payer: Self-pay

## 2023-07-08 NOTE — Telephone Encounter (Signed)
    Chief Complaint: Urinary frequency, pain, burning. States :My doctor usually calls in Cipro for me, I don't have to come in."  Symptoms: Above Frequency: Last night Pertinent Negatives: Patient denies fever Disposition: [] ED /[] Urgent Care (no appt availability in office) / [] Appointment(In office/virtual)/ []  [place] [facility]/ [] Home Care/ [x] Refused Recommended Disposition /[] [place] [facility]/ [x]  Follow-up with PCP Additional Notes: Please advise pt.  Reason for Disposition  Urinating more frequently than usual (i.e., frequency)  Answer Assessment - Initial Assessment Questions 1. SYMPTOM: "What's the main symptom you're concerned about?" (e.g., frequency, incontinence)     Pain, burning, frequency 2. ONSET: "When did the    start?"     Last night 3. PAIN: "Is there any pain?" If Yes, ask: "How bad is it?" (Scale: 1-10; mild, moderate, severe)     6 4. CAUSE: "What do you think is causing the symptoms?"     UTI 5. OTHER SYMPTOMS: "Do you have any other symptoms?" (e.g., blood in urine, fever, flank pain, pain with urination)     No 6. PREGNANCY: "Is there any chance you are pregnant?" "When was your last menstrual period?"     No  Protocols used: Urinary Symptoms-A-AH

## 2023-07-08 NOTE — Progress Notes (Unsigned)
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 was normal. The estimated ejection  fraction was in the range of 60% to 65%. Wall motion was normal;  there were no regional wall motion abnormalities. The transmitral  flow pattern was normal. The deceleration time of the early  transmitral flow velocity was normal. The pulmonary vein flow  pattern was normal. The tissue Doppler parameters were normal. Left  ventricular diastolic function parameters were normal.   -------------------------------------------------------------------  Aortic valve:   Trileaflet.  Doppler:   There was no stenosis.  There was no regurgitation.   -------------------------------------------------------------------  Aorta: Aortic root: The aortic root was normal in size.  Ascending aorta: The ascending aorta was normal in size.   -------------------------------------------------------------------  Mitral valve:   Normal thickness leaflets .  Doppler:   There was  no evidence for stenosis.   There was trivial regurgitation.  Valve area by pressure half-time: 3.67 cm^2. Indexed valve area by  pressure half-time: 2.21 cm^2/m^2.    Peak gradient (D): 8 mm Hg.     -------------------------------------------------------------------  Left atrium:  The atrium was normal in size.   -------------------------------------------------------------------  Right ventricle:  The cavity size was normal. Systolic function was  normal.   -------------------------------------------------------------------  Pulmonic valve:    Structurally normal valve.   Cusp separation was  normal.  Doppler:  Transvalvular velocity was within the normal  range. There was trivial regurgitation.   -------------------------------------------------------------------  Tricuspid valve:   Doppler:  There was trivial regurgitation.    -------------------------------------------------------------------  Right atrium:  The atrium was normal in size.   -------------------------------------------------------------------  Pericardium: There was no pericardial effusion.   -------------------------------------------------------------------  Systemic veins:  Inferior vena cava: The vessel was normal in size. The  respirophasic diameter changes were in the normal range (= 50%),  consistent with normal central venous pressure.   Cardiac catheterization 08/26/2017  Diagnostic Dominance: Right  Intervention    Assessment & Plan   1.  Essential hypertension-BP today 112/80. Continue carvedilol, clonidine, losartan Decrease hydralazine to 10 mg 3 times daily and may take an extra dose for systolic blood pressure greater than 160. Low-sodium diet Avoid caffeine, NSAIDs, stress reduction, increase physical activity as tolerated Maintain blood pressure log Order CBC, BMP  Hyperlipidemia-LDL 174 on 09/23/22. High-fiber diet Continue aspirin, omega-3 fatty acids, rosuvastatin  Syncope-denies further episodes of lightheadedness, presyncope or syncope.  Previously wore cardiac event monitor 10/23.  Results unavailable. Maintain p.o. hydration Change positions slowly  Disposition:  Follow-up with Dr. Servando Salina in 6 months.   Thomasene Ripple. Sakeenah Valcarcel NP-C     07/12/2023, 3:36 PM [facility] [address] Office [phone] Fax [phone])  [phone]    I spent 15 minutes examining this patient, reviewing medications, and using patient centered shared decision making involving their cardiac care.   I spent  20 minutes reviewing past medical history,  medications, and prior cardiac tests.

## 2023-07-11 ENCOUNTER — Other Ambulatory Visit: Payer: Self-pay

## 2023-07-11 ENCOUNTER — Ambulatory Visit (INDEPENDENT_AMBULATORY_CARE_PROVIDER_SITE_OTHER): Payer: 59 | Admitting: Psychology

## 2023-07-11 ENCOUNTER — Other Ambulatory Visit (HOSPITAL_COMMUNITY): Payer: Self-pay

## 2023-07-11 DIAGNOSIS — F4321 Adjustment disorder with depressed mood: Secondary | ICD-10-CM | POA: Diagnosis not present

## 2023-07-11 DIAGNOSIS — F4323 Adjustment disorder with mixed anxiety and depressed mood: Secondary | ICD-10-CM

## 2023-07-11 MED ORDER — CIPROFLOXACIN HCL 250 MG PO TABS
250.0000 mg | ORAL_TABLET | Freq: Two times a day (BID) | ORAL | 0 refills | Status: DC
  Filled 2023-07-11 (×2): qty 20, 10d supply, fill #0

## 2023-07-11 NOTE — Addendum Note (Signed)
 Addended by: Tresa Garter on: 07/11/2023 07:28 AM   Modules accepted: Orders

## 2023-07-11 NOTE — Telephone Encounter (Signed)
 Cipro was sent in.  Thanks

## 2023-07-11 NOTE — Telephone Encounter (Signed)
 Noted. Thanks.

## 2023-07-11 NOTE — Progress Notes (Signed)
 [name] is a 73 y.o. 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  Symptoms A diagnosis of an acute, serious illness that is life-threatening. (Status: maintained) -- No Description Entered  Diminished interest in or enjoyment of activities. (Status: maintained) -- No Description Entered  Lack of energy. (Status: maintained) -- No Description Entered  Sad affect, social withdrawal, anxiety, loss of interest in activities, and low energy. (Status: maintained) -- No Description Entered  Medication Status compliance  Safety unspecified If Suicidal or Homicidal State Action Taken: unspecified  Current Risk:  Medications Buspar (Dosage: 30mg  2X/day)  New Medication (Dosage: 250mg  2x/day)  New Medication (Dosage: 250mg  2x/day)  Prozac (Dosage: 40mg  daily)  Remeron (Dosage: 30mg  nightly)  xanax (Dosage: .5mg  2-3X/day)  Objectives Related Problem: Recognize, accept, and cope with feelings of depression. Description: Identify and replace thoughts and beliefs that support depression. Target Date: 2024-04-28 Frequency: Daily Modality: individual Progress: 80%  Related Problem: Recognize, accept, and cope with feelings of depression. Description: Verbalize an understanding of healthy and unhealthy emotions with the intent of increasing the use of healthy emotions to guide actions. Target Date: 2024-04-28 Frequency:  Daily Modality: individual Progress: 85%  Related Problem: Recognize, accept, and cope with feelings of depression. Description: Increasingly verbalize hopeful and positive statements regarding self, others, and the future. Target Date: 2022-04-28 Frequency: Daily Modality: individual Progress: 100%  Related Problem: Reduce fear, anxiety, and worry associated with the medical condition. Description: Share with significant others efforts to adapt successfully to the medical condition/diagnosis. Target Date: 2024-04-28 Frequency: Daily Modality: individual Progress: 85%  Related Problem: Reduce fear, anxiety, and worry associated with the medical condition. Description: Engage in social, productive, and recreational activities that are possible in spite of medical condition. Target Date: 2024-04-28 Frequency: Daily Modality: individual Progress: 90%  Related Problem: Reduce fear, anxiety, and worry associated with the medical condition. Description: Identify the coping skills and sources of emotional support that have been beneficial in the past. Target Date: 2024-04-28 Frequency: Daily Modality: individual Progress: 90%  Client Response full compliance  Service Location Location, [address] [place], [place] [address]  Service Code cpt [phone]  Related past to present  Facilitate problem solving  Identify/label emotions  Validate/empathize  Self care activities  Lifestyle change (exercise, nutrition)  Assess/facilitate readiness to change  Relaxation training  Rationally challenge thoughts or beliefs/cognitive restructuring  Comments  Dx: F43.21  Meds: Prozac, [name], patient is struggling to rebuild her life and adjust to her new circumstances. She suffers a negative self-concept and has little confidence in her judgement. She is seeking to reduce symptoms of anxiety and self-doubt and to re-define  satisfa ction moving forward. Therapy will focus on cognitive restructuring to redefine her experiences and focus on those satisfying aspects of her life. Will target this goal completion for April1, 2021 (completed). She is also wanting to utilize counseling to adjust and accept her latest diagnosis of cancer. She wants assistance to better manage this news and remain positive throughout treatment. Goal date is 12-22. Patient has made progress, but her medical condition is variable and she requests additional treatment to manage moods throughout her medical ordeal. In addition, she is trying to manage feelings associated with her older son's mental health struggles. Revised goal date is 12-25.   Patient agrees to a video session and is aware of the limitations of this platform. She is at home and I am in office.   [name]: She states she has been watching comedy TV series in an effort to feel better. She has no energy and is having trouble accomplishing tasks. Sleeping an excessive amount and is feeling depressed. Says her depression is worse and "doesn't care about anything". Cannot make herself do the things she knows she needs to do. We talked about whether she needs to change medications. She will meet with cardiologist tomorrow and discuss any of the physical conditions that may be impacting her functioning.  [name] Time: 4:15p-5:00p 45 minutes

## 2023-07-12 ENCOUNTER — Other Ambulatory Visit: Payer: Self-pay

## 2023-07-12 ENCOUNTER — Ambulatory Visit: Payer: 59 | Attending: General Practice | Admitting: General Practice

## 2023-07-12 ENCOUNTER — Encounter: Payer: Self-pay | Admitting: General Practice

## 2023-07-12 VITALS — BP 112/80 | HR 87 | Ht 61.0 in | Wt 175.2 lb

## 2023-07-12 DIAGNOSIS — I10 Essential (primary) hypertension: Secondary | ICD-10-CM | POA: Diagnosis not present

## 2023-07-12 DIAGNOSIS — Z87898 Personal history of other specified conditions: Secondary | ICD-10-CM | POA: Diagnosis not present

## 2023-07-12 DIAGNOSIS — E7849 Other hyperlipidemia: Secondary | ICD-10-CM | POA: Diagnosis not present

## 2023-07-12 MED ORDER — HYDRALAZINE HCL 10 MG PO TABS: 10.0000 mg | ORAL_TABLET | Freq: Three times a day (TID) | ORAL | 1 refills | Status: DC

## 2023-07-12 NOTE — Patient Instructions (Signed)
 Medication Instructions:  TAKE HYDRALAZINE 10MG  THREE-TIMES-DAILY; MAY TAKE EXTRA 10MG  FOR SYSTOLIC BLOOD PRESSURE(TOP NUMBER) GRATER THAN 160 *If you need a refill on your cardiac medications before your next appointment, please call your pharmacy*  Lab Work: CBC AND BMET TODAY If you have labs (blood work) drawn today and your tests are completely normal, you will receive your results only by:  MyChart Message (if you have MyChart) OR A paper copy in the mail If you have any lab test that is abnormal or we need to change your treatment, we will call you to review the results.  Testing/Procedures: NONE  Follow-Up: At [facility], you and your health needs are our priority.  As part of our continuing mission to provide you with exceptional heart care, we have created designated Provider Care Teams.  These Care Teams include your primary Cardiologist (physician) and Advanced Practice Providers (APPs -  Physician Assistants and Nurse Practitioners) who all work together to provide you with the care you need, when you need it. To learn more about what you can do with MyChart, go to [email or web address].    Your next appointment:   3-4 month(s)  Provider:   Thomasene Ripple, DO  or Edd Fabian, FNP        Other Instructions MAINTAIN PHYSICAL ACTIVITY  CONTINUE CURRENT DIET

## 2023-07-13 ENCOUNTER — Other Ambulatory Visit: Payer: Self-pay | Admitting: Cardiology

## 2023-07-13 ENCOUNTER — Ambulatory Visit: Payer: Self-pay

## 2023-07-13 ENCOUNTER — Other Ambulatory Visit: Payer: Self-pay

## 2023-07-13 ENCOUNTER — Other Ambulatory Visit (HOSPITAL_COMMUNITY): Payer: Self-pay

## 2023-07-13 DIAGNOSIS — E039 Hypothyroidism, unspecified: Secondary | ICD-10-CM | POA: Diagnosis not present

## 2023-07-13 DIAGNOSIS — N183 Chronic kidney disease, stage 3 unspecified: Secondary | ICD-10-CM | POA: Diagnosis not present

## 2023-07-13 DIAGNOSIS — M797 Fibromyalgia: Secondary | ICD-10-CM | POA: Diagnosis not present

## 2023-07-13 DIAGNOSIS — Z7982 Long term (current) use of aspirin: Secondary | ICD-10-CM | POA: Diagnosis not present

## 2023-07-13 DIAGNOSIS — I129 Hypertensive chronic kidney disease with stage 1 through stage 4 chronic kidney disease, or unspecified chronic kidney disease: Secondary | ICD-10-CM | POA: Diagnosis not present

## 2023-07-13 DIAGNOSIS — Z9181 History of falling: Secondary | ICD-10-CM | POA: Diagnosis not present

## 2023-07-13 LAB — CBC
Hematocrit: 37.3 % (ref 34.0–46.6)
Hemoglobin: 12 g/dL (ref 11.1–15.9)
MCH: 30.1 pg (ref 26.6–33.0)
MCHC: 32.2 g/dL (ref 31.5–35.7)
MCV: 94 fL (ref 79–97)
Platelets: 296 10*3/uL (ref 150–450)
RBC: 3.99 x10E6/uL (ref 3.77–5.28)
RDW: 13.1 % (ref 11.7–15.4)
WBC: 7.1 10*3/uL (ref 3.4–10.8)

## 2023-07-13 LAB — BASIC METABOLIC PANEL
BUN/Creatinine Ratio: 20 (ref 12–28)
BUN: 21 mg/dL (ref 8–27)
CO2: 20 mmol/L (ref 20–29)
Calcium: 9 mg/dL (ref 8.7–10.3)
Chloride: 106 mmol/L (ref 96–106)
Creatinine, Ser: 1.07 mg/dL — ABNORMAL HIGH (ref 0.57–1.00)
Glucose: 100 mg/dL — ABNORMAL HIGH (ref 70–99)
Potassium: 4.3 mmol/L (ref 3.5–5.2)
Sodium: 141 mmol/L (ref 134–144)
eGFR: 55 mL/min/{1.73_m2} — ABNORMAL LOW (ref >59)

## 2023-07-13 NOTE — Patient Instructions (Signed)
 Visit Information  Thank you for taking time to visit with me today. Please don't hesitate to contact me if I can be of assistance to you.   Following are the goals we discussed today:  Continue to take medications as prescribed. Continue to attend provider visits as scheduled Continue to eat healthy, lean meats, vegetables, fruits, avoid saturated and transfats Contact provider with health questions or concerns as needed Continue to work with therapist as recommended  Our next appointment is by telephone on 08/04/23 at 2:30 pm  Please call the care guide team at [phone] if you need to cancel or reschedule your appointment.   If you are experiencing a Mental Health or Behavioral Health Crisis or need someone to talk to, please call the Suicide and Crisis Lifeline: 988 call the [place] National Suicide Prevention Lifeline: 423-388-7381 or TTY: (364) 624-7795 TTY 249-642-2213) to talk to a trained counselor  Kathyrn Sheriff, RN, MSN, BSN, CCM [place]  [facility], Population Health Case Manager Phone: [phone]

## 2023-07-13 NOTE — Patient Outreach (Signed)
 Care Coordination   Follow Up Visit Note   07/13/2023 Name: [name] MRN: [id number] DOB: 1950-06-07  [name] is a 73 y.o. year old female who sees Plotnikov, Georgina Quint, MD for primary care. I spoke with  [name] by phone today.  What matters to the patients health and wellness today?  [name] reports she continues to feel tired, fatigued. Office visit with cardiology completed on 07/11/22-hydralazine decreased, but can take an extra dose is BP greater than 140 systolic. However, patient has not decreased yet. She reports BP today was 127/83. Patent request to speak with clinical pharmacist to see if medications re: possible medication interactions. Patient states she feels that her comprehension with reading has improved. She remains active with home health PT.   Goals Addressed             This Visit's Progress    Assist with Health management       Interventions Today    Flowsheet Row Most Recent Value  Chronic Disease   Chronic disease during today's visit Hypertension (HTN)  General Interventions   General Interventions Discussed/Reviewed General Interventions Reviewed, Doctor Visits  [Evaluation of current treatment plan for health condition and patient's adherence to plan.]  Doctor Visits Discussed/Reviewed PCP, Specialist  PCP/Specialist Visits Compliance with follow-up visit  [reviewed upcoming scheduled appointments]  Education Interventions   Education Provided Provided Education  Provided Verbal Education On Medication, When to see the doctor, Exercise  [advised to take medications as prescribed, attend provider visit as scheduled, eat healthy, work with therapist as recommended.]  Mental Health Interventions   Mental Health Discussed/Reviewed Mental Health Reviewed  [advised continue to attend provider appointments as scheduled]  Nutrition Interventions   Nutrition Discussed/Reviewed Nutrition Reviewed  Pharmacy Interventions    Pharmacy Dicussed/Reviewed Pharmacy Topics Reviewed, Referral to Pharmacist  [patient requesting to speak with clinical pharmacist to review medications to see if any possible interactions.]  Referral to Pharmacist Drug interaction/side effects  Safety Interventions   Safety Discussed/Reviewed Safety Discussed, Fall Risk  [confirmed continues to work with home health therapy]            SDOH assessments and interventions completed:  No  Care Coordination Interventions:  Yes, provided   Follow up plan: Follow up call scheduled for 08/04/23    Encounter Outcome:  Patient Visit Completed   Kathyrn Sheriff, RN, MSN, BSN, CCM [place]  [facility], Population Health Case Manager Phone: [phone]

## 2023-07-14 ENCOUNTER — Other Ambulatory Visit: Payer: Self-pay

## 2023-07-15 ENCOUNTER — Other Ambulatory Visit (HOSPITAL_COMMUNITY): Payer: Self-pay

## 2023-07-15 ENCOUNTER — Telehealth: Payer: Self-pay

## 2023-07-15 ENCOUNTER — Other Ambulatory Visit (HOSPITAL_BASED_OUTPATIENT_CLINIC_OR_DEPARTMENT_OTHER): Payer: Self-pay

## 2023-07-15 ENCOUNTER — Other Ambulatory Visit: Payer: Self-pay

## 2023-07-15 MED ORDER — COLESEVELAM HCL 625 MG PO TABS
1250.0000 mg | ORAL_TABLET | Freq: Two times a day (BID) | ORAL | 3 refills | Status: DC
  Filled 2023-07-15 (×2): qty 120, 30d supply, fill #0
  Filled 2023-07-21 – 2023-08-08 (×3): qty 120, 30d supply, fill #1
  Filled 2023-08-31 – 2023-09-13 (×5): qty 120, 30d supply, fill #2
  Filled 2023-10-03 – 2023-10-06 (×2): qty 120, 30d supply, fill #3
  Filled 2023-10-26 – 2023-11-15 (×3): qty 120, 30d supply, fill #4
  Filled 2023-11-17 – 2023-12-08 (×3): qty 120, 30d supply, fill #5

## 2023-07-15 MED ORDER — HYDRALAZINE HCL 10 MG PO TABS
ORAL_TABLET | ORAL | 1 refills | Status: DC
  Filled 2023-07-15: qty 90, 30d supply, fill #0
  Filled 2023-07-21 – 2023-08-08 (×2): qty 90, 30d supply, fill #1

## 2023-07-15 MED ORDER — FLUOXETINE HCL 40 MG PO CAPS
40.0000 mg | ORAL_CAPSULE | Freq: Every morning | ORAL | 1 refills | Status: DC
  Filled 2023-07-15 (×2): qty 30, 30d supply, fill #0
  Filled 2023-07-21 – 2023-08-08 (×3): qty 30, 30d supply, fill #1
  Filled 2023-08-31 – 2023-09-13 (×5): qty 30, 30d supply, fill #2
  Filled 2023-10-03 – 2023-10-06 (×2): qty 30, 30d supply, fill #3
  Filled 2023-10-26 – 2023-11-08 (×2): qty 30, 30d supply, fill #4

## 2023-07-15 NOTE — Progress Notes (Signed)
 Care Guide Pharmacy Note  07/15/2023 Name: VILLA BURGIN MRN: [id number] DOB: 12/21/1950  Referred By: Tresa Garter, MD Reason for referral: Care Coordination (Outreach to schedule with Pharm d )   ICEY TELLO is a 73 y.o. year old female who is a primary care patient of Plotnikov, Georgina Quint, MD.  [name] was referred to the pharmacist for assistance related to:  med review   An unsuccessful telephone outreach was attempted today to contact the patient who was referred to the pharmacy team for assistance with medication management. Additional attempts will be made to contact the patient.  Penne Lash , RMA     [facility]  [facility], [facility] Guide  Direct Dial: [phone]  Website: [email or web address]

## 2023-07-15 NOTE — Progress Notes (Signed)
 Care Guide Pharmacy Note  07/15/2023 Name: [name] MRN: [id number] DOB: 1950-09-24  Referred By: Tresa Garter, MD Reason for referral: Care Coordination (Outreach to schedule with Pharm d )   [name] is a 73 y.o. year old female who is a primary care patient of Plotnikov, Georgina Quint, MD.  [name] was referred to the pharmacist for assistance related to:  med review   Successful contact was made with the patient to discuss pharmacy services including being ready for the pharmacist to call at least 5 minutes before the scheduled appointment time and to have medication bottles and any blood pressure readings ready for review. The patient agreed to meet with the pharmacist via telephone visit on (date/time).08/01/2023  Penne Lash , RMA     [place]  [facility], [facility] Guide  Direct Dial: [phone]  Website: [email or web address]

## 2023-07-18 ENCOUNTER — Other Ambulatory Visit (HOSPITAL_COMMUNITY): Payer: Self-pay

## 2023-07-18 ENCOUNTER — Telehealth: Payer: Self-pay

## 2023-07-18 ENCOUNTER — Other Ambulatory Visit: Payer: Self-pay

## 2023-07-18 ENCOUNTER — Encounter: Payer: Self-pay | Admitting: Pharmacist

## 2023-07-18 NOTE — Telephone Encounter (Signed)
 Copied from CRM [id number]. Topic: General - Other >> Jul 18, 2023  4:53 PM Eunice Blase wrote: Reason for CRM: Pt called stated needs to live in assistant living facility and for now needs home health care. Please advise pt call [phone].

## 2023-07-19 ENCOUNTER — Ambulatory Visit: Payer: 59 | Admitting: Psychology

## 2023-07-20 ENCOUNTER — Ambulatory Visit: Payer: 59 | Admitting: Psychology

## 2023-07-20 NOTE — Telephone Encounter (Signed)
 Copied from CRM [id number]. Topic: General - Other >> Jul 20, 2023 11:26 AM Aletta Edouard wrote: Reason for CRM: patient would like a call back from the nurse regarding going to assistance living place for herself

## 2023-07-20 NOTE — Progress Notes (Unsigned)
 Marland Kitchen

## 2023-07-21 ENCOUNTER — Other Ambulatory Visit: Payer: Self-pay

## 2023-07-21 ENCOUNTER — Other Ambulatory Visit: Payer: Self-pay | Admitting: Cardiology

## 2023-07-21 ENCOUNTER — Telehealth: Payer: Self-pay

## 2023-07-21 ENCOUNTER — Other Ambulatory Visit: Payer: Self-pay | Admitting: Internal Medicine

## 2023-07-21 ENCOUNTER — Other Ambulatory Visit (HOSPITAL_COMMUNITY): Payer: Self-pay

## 2023-07-21 DIAGNOSIS — Z79899 Other long term (current) drug therapy: Secondary | ICD-10-CM

## 2023-07-21 DIAGNOSIS — I10 Essential (primary) hypertension: Secondary | ICD-10-CM

## 2023-07-21 MED ORDER — CLONIDINE HCL 0.2 MG PO TABS
0.2000 mg | ORAL_TABLET | Freq: Every day | ORAL | 0 refills | Status: DC
  Filled 2023-08-08 (×2): qty 15, 15d supply, fill #0

## 2023-07-21 MED ORDER — ROSUVASTATIN CALCIUM 10 MG PO TABS
10.0000 mg | ORAL_TABLET | Freq: Every day | ORAL | 0 refills | Status: DC
  Filled 2023-08-08 – 2023-08-10 (×3): qty 15, 15d supply, fill #0

## 2023-07-21 NOTE — Telephone Encounter (Signed)
 Copied from CRM [id number]. Topic: General - Other >> Jul 21, 2023 10:20 AM Fredrich Romans wrote: Reason for CRM: Patient called in demanding to speak  to plotnikov nurse regarding him placing an order for her to be placed into an assistant living  facility.

## 2023-07-21 NOTE — Telephone Encounter (Signed)
 Patient called back in she is needing the nurse coordinator to give her call back today

## 2023-07-22 ENCOUNTER — Ambulatory Visit

## 2023-07-22 ENCOUNTER — Other Ambulatory Visit (HOSPITAL_COMMUNITY): Payer: Self-pay

## 2023-07-22 VITALS — Ht 61.0 in | Wt 175.0 lb

## 2023-07-22 DIAGNOSIS — Z0000 Encounter for general adult medical examination without abnormal findings: Secondary | ICD-10-CM

## 2023-07-22 DIAGNOSIS — Z602 Problems related to living alone: Secondary | ICD-10-CM | POA: Diagnosis not present

## 2023-07-22 DIAGNOSIS — Z0100 Encounter for examination of eyes and vision without abnormal findings: Secondary | ICD-10-CM

## 2023-07-22 MED ORDER — ESOMEPRAZOLE MAGNESIUM 40 MG PO CPDR
40.0000 mg | DELAYED_RELEASE_CAPSULE | Freq: Every morning | ORAL | 3 refills | Status: DC
  Filled 2023-08-08 (×2): qty 30, 30d supply, fill #0
  Filled 2023-08-31 – 2023-09-13 (×5): qty 30, 30d supply, fill #1
  Filled 2023-10-03 – 2023-10-06 (×2): qty 30, 30d supply, fill #2
  Filled 2023-10-26 – 2023-11-15 (×3): qty 30, 30d supply, fill #3
  Filled 2023-11-17 – 2023-12-08 (×3): qty 30, 30d supply, fill #4
  Filled 2024-01-05 – 2024-01-15 (×3): qty 30, 30d supply, fill #5

## 2023-07-22 NOTE — Patient Instructions (Addendum)
 [name] , Thank you for taking time to come for your Medicare Wellness Visit. I appreciate your ongoing commitment to your health goals. Please review the following plan we discussed and let me know if I can assist you in the future.   Referrals/Orders/Follow-Ups/Clinician Recommendations: Aim for 30 minutes of exercise or brisk walking, 6-8 glasses of water, and 5 servings of fruits and vegetables each day.   This is a list of the screening recommended for you and due dates:  Health Maintenance  Topic Date Due   Mammogram  06/15/2012   DTaP/Tdap/Td vaccine (4 - Tdap) 06/20/2019   COVID-19 Vaccine (4 - 2024-25 season) 01/16/2023   Medicare Annual Wellness Visit  07/21/2024   Colon Cancer Screening  05/12/2026   Pneumonia Vaccine  Completed   Flu Shot  Completed   DEXA scan (bone density measurement)  Completed   Hepatitis C Screening  Completed   HPV Vaccine  Aged Out   Zoster (Shingles) Vaccine  Discontinued    Advanced directives: (In Chart) A copy of your advanced directives are scanned into your chart should your provider ever need it.  Next Medicare Annual Wellness Visit scheduled for next year: Yes   Managing Pain Without Opioids Opioids are strong medicines used to treat moderate to severe pain. For some people, especially those who have long-term (chronic) pain, opioids may not be the best choice for pain management due to: Side effects like nausea, constipation, and sleepiness. The risk of addiction (opioid use disorder). The longer you take opioids, the greater your risk of addiction. Pain that lasts for more than 3 months is called chronic pain. Managing chronic pain usually requires more than one approach and is often provided by a team of health care providers working together (multidisciplinary approach). Pain management may be done at a pain management center or pain clinic. How to manage pain without the use of opioids Use non-opioid medicines Non-opioid  medicines for pain may include: Over-the-counter or prescription non-steroidal anti-inflammatory drugs (NSAIDs). These may be the first medicines used for pain. They work well for muscle and bone pain, and they reduce swelling. Acetaminophen. This over-the-counter medicine may work well for milder pain but not swelling. Antidepressants. These may be used to treat chronic pain. A certain type of antidepressant (tricyclics) is often used. These medicines are given in lower doses for pain than when used for depression. Anticonvulsants. These are usually used to treat seizures but may also reduce nerve (neuropathic) pain. Muscle relaxants. These relieve pain caused by sudden muscle tightening (spasms). You may also use a pain medicine that is applied to the skin as a patch, cream, or gel (topical analgesic), such as a numbing medicine. These may cause fewer side effects than medicines taken by mouth. Do certain therapies as directed Some therapies can help with pain management. They include: Physical therapy. You will do exercises to gain strength and flexibility. A physical therapist may teach you exercises to move and stretch parts of your body that are weak, stiff, or painful. You can learn these exercises at physical therapy visits and practice them at home. Physical therapy may also involve: Massage. Heat wraps or applying heat or cold to affected areas. Electrical signals that interrupt pain signals (transcutaneous electrical nerve stimulation, TENS). Weak lasers that reduce pain and swelling (low-level laser therapy). Signals from your body that help you learn to regulate pain (biofeedback). Occupational therapy. This helps you to learn ways to function at home and work with less pain. Recreational  therapy. This involves trying new activities or hobbies, such as a physical activity or drawing. Mental health therapy, including: Cognitive behavioral therapy (CBT). This helps you learn coping  skills for dealing with pain. Acceptance and commitment therapy (ACT) to change the way you think and react to pain. Relaxation therapies, including muscle relaxation exercises and mindfulness-based stress reduction. Pain management counseling. This may be individual, family, or group counseling.  Receive medical treatments Medical treatments for pain management include: Nerve block injections. These may include a pain blocker and anti-inflammatory medicines. You may have injections: Near the spine to relieve chronic back or neck pain. Into joints to relieve back or joint pain. Into nerve areas that supply a painful area to relieve body pain. Into muscles (trigger point injections) to relieve some painful muscle conditions. A medical device placed near your spine to help block pain signals and relieve nerve pain or chronic back pain (spinal cord stimulation device). Acupuncture. Follow these instructions at home Medicines Take over-the-counter and prescription medicines only as told by your health care provider. If you are taking pain medicine, ask your health care providers about possible side effects to watch out for. Do not drive or use heavy machinery while taking prescription opioid pain medicine. Lifestyle  Do not use drugs or alcohol to reduce pain. If you drink alcohol, limit how much you have to: 0-1 drink a day for women who are not pregnant. 0-2 drinks a day for men. Know how much alcohol is in a drink. In the [place] one drink equals one 12 oz bottle of beer (355 mL), one 5 oz glass of wine (148 mL), or one 1 oz glass of hard liquor (44 mL). Do not use any products that contain nicotine or tobacco. These products include cigarettes, chewing tobacco, and vaping devices, such as e-cigarettes. If you need help quitting, ask your health care provider. Eat a healthy diet and maintain a healthy weight. Poor diet and excess weight may make pain worse. Eat foods that are high in fiber.  These include fresh fruits and vegetables, whole grains, and beans. Limit foods that are high in fat and processed sugars, such as fried and sweet foods. Exercise regularly. Exercise lowers stress and may help relieve pain. Ask your health care provider what activities and exercises are safe for you. If your health care provider approves, join an exercise class that combines movement and stress reduction. Examples include yoga and tai chi. Get enough sleep. Lack of sleep may make pain worse. Lower stress as much as possible. Practice stress reduction techniques as told by your therapist. General instructions Work with all your pain management providers to find the treatments that work best for you. You are an important member of your pain management team. There are many things you can do to reduce pain on your own. Consider joining an online or in-person support group for people who have chronic pain. Keep all follow-up visits. This is important. Where to find more information You can find more information about managing pain without opioids from: American Academy of Pain Medicine: painmed.org Institute for Chronic Pain: instituteforchronicpain.org American Chronic Pain Association: theacpa.org Contact a health care provider if: You have side effects from pain medicine. Your pain gets worse or does not get better with treatments or home therapy. You are struggling with anxiety or depression. Summary Many types of pain can be managed without opioids. Chronic pain may respond better to pain management without opioids. Pain is best managed when you and a  team of health care providers work together. Pain management without opioids may include non-opioid medicines, medical treatments, physical therapy, mental health therapy, and lifestyle changes. Tell your health care providers if your pain gets worse or is not being managed well enough. This information is not intended to replace advice given  to you by your health care provider. Make sure you discuss any questions you have with your health care provider. Document Revised: 08/13/2020 Document Reviewed: 08/13/2020 [employer]vier Patient Education  2024 [employer].

## 2023-07-22 NOTE — Progress Notes (Cosign Needed Addendum)
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 Trivalent(High Dose 65+) 04/26/2023   H1N1 06/14/2008   Influenza Whole 02/14/2008, 02/17/2009, 03/17/2013   Influenza, High Dose Seasonal PF 04/02/2015, 05/31/2017, 03/16/2018, 12/27/2018   Influenza-Unspecified 03/24/2016, 02/25/2021   MMR 06/19/2009   PFIZER(Purple Top)SARS-COV-2 Vaccination 06/05/2019, 06/23/2019, 02/14/2020   Pneumococcal Conjugate-13 05/05/2016   Pneumococcal Polysaccharide-23 05/31/2017   Td 05/17/2000, 06/19/2009   Td (Adult), 2 Lf Tetanus Toxid, Preservative Free 05/17/2000, 06/19/2009   Zoster Recombinant(Shingrix) 02/25/2021   Zoster, Live 04/16/2014    Screening Tests Health Maintenance  Topic Date Due   MAMMOGRAM  06/15/2012   DTaP/Tdap/Td (4 - Tdap) 06/20/2019   COVID-19 Vaccine (4 - 2024-25 season) 01/16/2023   Medicare Annual Wellness (AWV)  07/21/2024   Colonoscopy  05/12/2026   Pneumonia Vaccine 72+ Years old  Completed   INFLUENZA VACCINE  Completed   DEXA SCAN   Completed   Hepatitis C Screening  Completed   HPV VACCINES  Aged Out   Zoster Vaccines- Shingrix  Discontinued    Health Maintenance  Health Maintenance Due  Topic Date Due   MAMMOGRAM  06/15/2012   DTaP/Tdap/Td (4 - Tdap) 06/20/2019   COVID-19 Vaccine (4 - 2024-25 season) 01/16/2023   Health Maintenance Items Addressed:07/22/2023 Referral sent to Optometry/Ophthalmology - current [facility] no longer accepts the pt's insurance plan).   Additional Screening:  Vision Screening: Recommended annual ophthalmology exams for early detection of glaucoma and other disorders of the eye. Referral to an Opthalmologist has been done today.  Dental Screening: Recommended annual dental exams for proper oral hygiene  Community Resource Referral / Chronic Care Management: CRR required this visit?   Yes- referral to RN Case Mgr to assist pt with housing placement to an Assisted Living Facility.  CCM required this visit? No  Plan:     I have personally reviewed and noted the following in the patient's chart:   Medical and social history Use of alcohol, tobacco or illicit drugs  Current  medications and supplements including opioid prescriptions. Patient is currently taking opioid prescriptions. Information provided to patient regarding non-opioid alternatives. Patient advised to discuss non-opioid treatment plan with their provider. Functional ability and status Nutritional status Physical activity Advanced directives List of other physicians Hospitalizations, surgeries, and ER visits in previous 12 months Vitals Screenings to include cognitive, depression, and falls Referrals and appointments  In addition, I have reviewed and discussed with patient certain preventive protocols, quality metrics, and best practice recommendations. A written personalized care plan for preventive services as well as general preventive health recommendations were provided to patient.     Darreld Mclean, CMA   07/22/2023   After Visit Summary: (MyChart) Due to this being a telephonic visit, the after visit summary with patients personalized plan was offered to patient via MyChart   Notes: Please refer to Routing Comments.  Medical screening examination/treatment/procedure(s) were performed by non-physician practitioner and as supervising physician I was immediately available for consultation/collaboration.  I agree with above. Jacinta Shoe, MD

## 2023-07-25 ENCOUNTER — Telehealth: Payer: Self-pay | Admitting: *Deleted

## 2023-07-25 ENCOUNTER — Other Ambulatory Visit (HOSPITAL_COMMUNITY): Payer: Self-pay

## 2023-07-25 ENCOUNTER — Ambulatory Visit: Payer: 59 | Admitting: Psychology

## 2023-07-25 DIAGNOSIS — F4323 Adjustment disorder with mixed anxiety and depressed mood: Secondary | ICD-10-CM

## 2023-07-25 NOTE — Telephone Encounter (Signed)
 Does she need me to fill out [place] 2 form? She will have to find a facility. Thank you

## 2023-07-25 NOTE — Progress Notes (Unsigned)
 Complex Care Management Note Care Guide Note  07/25/2023 Name: [name] MRN: [id number] DOB: 1951-04-25   Complex Care Management Outreach Attempts: An unsuccessful telephone outreach was attempted today to offer the patient information about available complex care management services.  Follow Up Plan:  Additional outreach attempts will be made to offer the patient complex care management information and services.   Encounter Outcome:  No Answer  Burman Nieves, CMA, Care Guide [facility]  [facility], [facility] Guide Direct Dial: [phone]  Fax: [phone] Website: [email or web address]

## 2023-07-25 NOTE — Progress Notes (Signed)
 [name] is a 73 y.o. 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  Symptoms A diagnosis of an acute, serious illness that is life-threatening. (Status: maintained) -- No Description Entered  Diminished interest in or enjoyment of activities. (Status: maintained) -- No Description Entered  Lack of energy. (Status: maintained) -- No Description Entered  Sad affect, social withdrawal, anxiety, loss of interest in activities, and low energy. (Status: maintained) -- No Description Entered  Medication Status compliance  Safety unspecified If Suicidal or Homicidal State Action Taken: unspecified  Current Risk:  Medications Buspar (Dosage: 30mg  2X/day)  New Medication (Dosage: 250mg  2x/day)  New Medication (Dosage: 250mg  2x/day)  Prozac (Dosage: 40mg  daily)  Remeron (Dosage: 30mg  nightly)  xanax (Dosage: .5mg  2-3X/day)  Objectives Related Problem: Recognize, accept, and cope with feelings of depression. Description: Identify and replace thoughts and beliefs that support depression. Target Date: 2024-04-28 Frequency: Daily Modality: individual Progress: 80%  Related Problem: Recognize, accept, and cope with feelings of depression. Description: Verbalize an understanding of healthy and unhealthy emotions with the intent of increasing the use of healthy emotions to  guide actions. Target Date: 2024-04-28 Frequency: Daily Modality: individual Progress: 85%  Related Problem: Recognize, accept, and cope with feelings of depression. Description: Increasingly verbalize hopeful and positive statements regarding self, others, and the future. Target Date: 2022-04-28 Frequency: Daily Modality: individual Progress: 100%  Related Problem: Reduce fear, anxiety, and worry associated with the medical condition. Description: Share with significant others efforts to adapt successfully to the medical condition/diagnosis. Target Date: 2024-04-28 Frequency: Daily Modality: individual Progress: 85%  Related Problem: Reduce fear, anxiety, and worry associated with the medical condition. Description: Engage in social, productive, and recreational activities that are possible in spite of medical condition. Target Date: 2024-04-28 Frequency: Daily Modality: individual Progress: 90%  Related Problem: Reduce fear, anxiety, and worry associated with  the medical condition. Description: Identify the coping skills and sources of emotional support that have been beneficial in the past. Target Date: 2024-04-28 Frequency: Daily Modality: individual Progress: 90%  Client Response full compliance  Service Location Location, [address] [place], [place] [address]  Service Code cpt [id number]  Related past to present  Facilitate problem solving  Identify/label emotions  Validate/empathize  Self care activities  Lifestyle change (exercise, nutrition)  Assess/facilitate readiness to change  Relaxation training  Rationally challenge thoughts or beliefs/cognitive restructuring  Comments  Dx: F43.21  Meds: Prozac, [name], patient is struggling to rebuild her life and adjust to her new circumstances. She suffers a negative self-concept and has little confidence in her judgement. She is seeking to reduce  symptoms of anxiety and self-doubt and to re-define satisfa ction moving forward. Therapy will focus on cognitive restructuring to redefine her experiences and focus on those satisfying aspects of her life. Will target this goal completion for April1, 2021 (completed). She is also wanting to utilize counseling to adjust and accept her latest diagnosis of cancer. She wants assistance to better manage this news and remain positive throughout treatment. Goal date is 12-22. Patient has made progress, but her medical condition is variable and she requests additional treatment to manage moods throughout her medical ordeal. In addition, she is trying to manage feelings associated with her older son's mental health struggles. Revised goal date is 12-25.   Patient agrees to a video session and is aware of the limitations of this platform. She is at home and I am in home office.   [name]: She states she still feels bad and her Fibromyalgia is flaring up. She says she feels "worst" than she has ever felt physically. She does admit to feeling depressed ("not anxious") and apathetic. She is being told by her doctors that there is not much else to do to make her feel better. Her care providers are suggesting that she go into an assisted living facility. It is financially out of reach, but she could likely get assistance. Problem is she would be in the least expensive available facility. She is reluctant to go to that type of facility. We talked about a potential move and the emotional challenges.  Donald Memoli LEWIS Time: 4:15p-5:00p 45 minutes

## 2023-07-26 ENCOUNTER — Other Ambulatory Visit (HOSPITAL_COMMUNITY): Payer: Self-pay

## 2023-07-26 NOTE — Progress Notes (Signed)
 Complex Care Management Note  Care Guide Note 07/26/2023 Name: SHERRYLL SKOCZYLAS MRN: [id number] DOB: 07-01-1950  KAYTLYN DIN is a 73 y.o. year old female who sees Plotnikov, Georgina Quint, MD for primary care. I reached out to [name] by phone today to offer complex care management services.  [name] was given information about Complex Care Management services today including:   The Complex Care Management services include support from the care team which includes your Nurse Care Manager, Clinical Social Worker, or Pharmacist.  The Complex Care Management team is here to help remove barriers to the health concerns and goals most important to you. Complex Care Management services are voluntary, and the patient may decline or stop services at any time by request to their care team member.   Complex Care Management Consent Status: Patient agreed to services and verbal consent obtained.   Follow up plan:  Telephone appointment with complex care management team member scheduled for:  08/02/2023  Encounter Outcome:  Patient Scheduled  Burman Nieves, CMA, Care Guide [facility]  [facility], [facility] Guide Direct Dial: [phone]  Fax: [phone] Website: [email or web address]

## 2023-07-26 NOTE — Telephone Encounter (Signed)
 LVM for pt [asking the following per the provider "Does she need me to fill out [place] 2 form? She will have to find a facility. Thank you"

## 2023-07-27 ENCOUNTER — Other Ambulatory Visit: Payer: Self-pay

## 2023-07-28 ENCOUNTER — Other Ambulatory Visit: Payer: Self-pay

## 2023-07-28 ENCOUNTER — Ambulatory Visit: Payer: 59 | Admitting: Internal Medicine

## 2023-07-28 ENCOUNTER — Encounter: Payer: Self-pay | Admitting: Internal Medicine

## 2023-07-28 VITALS — BP 130/88 | HR 72 | Temp 98.4°F | Ht 61.0 in | Wt 176.0 lb

## 2023-07-28 DIAGNOSIS — C499 Malignant neoplasm of connective and soft tissue, unspecified: Secondary | ICD-10-CM | POA: Diagnosis not present

## 2023-07-28 DIAGNOSIS — R202 Paresthesia of skin: Secondary | ICD-10-CM

## 2023-07-28 DIAGNOSIS — R0609 Other forms of dyspnea: Secondary | ICD-10-CM

## 2023-07-28 DIAGNOSIS — R531 Weakness: Secondary | ICD-10-CM | POA: Diagnosis not present

## 2023-07-28 DIAGNOSIS — F314 Bipolar disorder, current episode depressed, severe, without psychotic features: Secondary | ICD-10-CM | POA: Diagnosis not present

## 2023-07-28 DIAGNOSIS — N1832 Chronic kidney disease, stage 3b: Secondary | ICD-10-CM

## 2023-07-28 LAB — URINALYSIS
Bilirubin Urine: NEGATIVE
Hgb urine dipstick: NEGATIVE
Ketones, ur: NEGATIVE
Leukocytes,Ua: NEGATIVE
Nitrite: NEGATIVE
Specific Gravity, Urine: 1.025 (ref 1.000–1.030)
Total Protein, Urine: NEGATIVE
Urine Glucose: NEGATIVE
Urobilinogen, UA: 0.2 (ref 0.0–1.0)
pH: 6 (ref 5.0–8.0)

## 2023-07-28 LAB — CBC WITH DIFFERENTIAL/PLATELET
Basophils Absolute: 0.1 10*3/uL (ref 0.0–0.1)
Basophils Relative: 1.2 % (ref 0.0–3.0)
Eosinophils Absolute: 0.2 10*3/uL (ref 0.0–0.7)
Eosinophils Relative: 3.2 % (ref 0.0–5.0)
HCT: 38.2 % (ref 36.0–46.0)
Hemoglobin: 12.8 g/dL (ref 12.0–15.0)
Lymphocytes Relative: 20.2 % (ref 12.0–46.0)
Lymphs Abs: 1.4 10*3/uL (ref 0.7–4.0)
MCHC: 33.5 g/dL (ref 30.0–36.0)
MCV: 92.5 fl (ref 78.0–100.0)
Monocytes Absolute: 0.5 10*3/uL (ref 0.1–1.0)
Monocytes Relative: 6.3 % (ref 3.0–12.0)
Neutro Abs: 4.9 10*3/uL (ref 1.4–7.7)
Neutrophils Relative %: 69.1 % (ref 43.0–77.0)
Platelets: 342 10*3/uL (ref 150.0–400.0)
RBC: 4.12 Mil/uL (ref 3.87–5.11)
RDW: 14 % (ref 11.5–15.5)
WBC: 7.1 10*3/uL (ref 4.0–10.5)

## 2023-07-28 LAB — COMPREHENSIVE METABOLIC PANEL
ALT: 18 U/L (ref 0–35)
AST: 21 U/L (ref 0–37)
Albumin: 4.7 g/dL (ref 3.5–5.2)
Alkaline Phosphatase: 100 U/L (ref 39–117)
BUN: 29 mg/dL — ABNORMAL HIGH (ref 6–23)
CO2: 27 meq/L (ref 19–32)
Calcium: 9.4 mg/dL (ref 8.4–10.5)
Chloride: 106 meq/L (ref 96–112)
Creatinine, Ser: 1.26 mg/dL — ABNORMAL HIGH (ref 0.40–1.20)
GFR: 42.35 mL/min — ABNORMAL LOW (ref >60.00)
Glucose, Bld: 99 mg/dL (ref 70–99)
Potassium: 4 meq/L (ref 3.5–5.1)
Sodium: 141 meq/L (ref 135–145)
Total Bilirubin: 0.3 mg/dL (ref 0.2–1.2)
Total Protein: 7.5 g/dL (ref 6.0–8.3)

## 2023-07-28 NOTE — Assessment & Plan Note (Signed)
 Hydrate well

## 2023-07-28 NOTE — Assessment & Plan Note (Signed)
 FTT: planning to move to an assisted living - situational worsening

## 2023-07-28 NOTE — Assessment & Plan Note (Signed)
 FTT: planning to move to an assisted living - worsening weakness

## 2023-07-28 NOTE — Progress Notes (Signed)
 Subjective:  Patient ID: [name], female    DOB: December 07, 1950  Age: 73 y.o. MRN: [id number]  CC: Annual Exam   HPI [name] presents for FTT: planning to move to an assisted living facility, h/o cancer Depressed  Outpatient Medications Prior to Visit  Medication Sig Dispense Refill   ALPRAZolam (XANAX) 1 MG tablet Take 1 tablet (1 mg total) by mouth 3 (three) times daily as needed for anxiety. 90 tablet 1   ascorbic acid (VITAMIN C) 500 MG tablet Take 1 tablet (500 mg total) by mouth daily. 100 tablet 3   aspirin EC 81 MG tablet Take 81 mg by mouth daily. Swallow whole.     buPROPion (WELLBUTRIN XL) 150 MG 24 hr tablet Take 1 tablet (150 mg total) by mouth in the morning. 30 tablet 3   busPIRone (BUSPAR) 30 MG tablet Take 1 tablet (30 mg total) by mouth 2 (two) times daily. 60 tablet 3   carvedilol (COREG) 12.5 MG tablet Take 1 tablet (12.5 mg total) by mouth 2 (two) times daily with a meal. 60 tablet 2   Cholecalciferol (D3 5000) 125 MCG (5000 UT) capsule Take 1 capsule (5,000 Units total) by mouth daily. 100 capsule 3   cloNIDine (CATAPRES) 0.2 MG tablet Take 1 tablet (0.2 mg total) by mouth at bedtime. 2nd attempt, patient needs an appt for additional refills 15 tablet 0   colesevelam (WELCHOL) 625 MG tablet Take 2 tablets (1,250 mg total) by mouth 2 (two) times daily with a meal. 360 tablet 3   cyanocobalamin (VITAMIN B12) 1000 MCG tablet Take 1 tablet (1,000 mcg total) by mouth daily. Take 1,000 mcg by mouth daily. 100 tablet 3   D-Mannose 500 MG CAPS Take 500 mg by mouth 2 (two) times daily. WITH CRANBERRY & DANDELION EXTRACT 180 capsule 3   esomeprazole (NEXIUM) 40 MG capsule Take 1 capsule (40 mg total) by mouth every morning. 90 capsule 3   FLUoxetine (PROZAC) 40 MG capsule Take 1 capsule (40 mg total) by mouth in the morning. 90 capsule 1   hydrALAZINE (APRESOLINE) 10 MG tablet Take 1 tablet (10 mg total) by mouth in the morning, at noon, and at bedtime.  MAY TAKE EXTRA 10MG  FOR SBP>160 90 tablet 1   HYDROcodone-acetaminophen (NORCO/VICODIN) 5-325 MG tablet Take 1 tablet by mouth every 6 (six) hours as needed for moderate pain (pain score 4-6). 30 tablet 0   ipratropium-albuterol (DUONEB) 0.5-2.5 (3) MG/3ML SOLN Take 3 mLs by nebulization every 6 (six) hours as needed (Asthma).     iron polysaccharides (POLY-IRON 150) 150 MG capsule Take 1 capsule (150 mg total) by mouth daily. (Patient taking differently: Take 150 mg by mouth 2 (two) times daily.) 90 capsule 3   Lactobacillus (ACIDOPHILUS) CAPS capsule Take 1 capsule by mouth daily. 100 capsule 3   levothyroxine (SYNTHROID) 100 MCG tablet Take 1 tablet (100 mcg total) by mouth daily before breakfast. 30 tablet 11   loperamide (IMODIUM A-D) 2 MG tablet Take 2 mg by mouth 4 (four) times daily as needed for diarrhea or loose stools.     losartan (COZAAR) 50 MG tablet TAKE 1 TABLET BY MOUTH EVERY DAY 90 tablet 2   Metamucil Fiber 2 g CHEW Chew 2 tablets by mouth in the morning and at bedtime. 100 tablet 11   mirtazapine (REMERON) 30 MG tablet Take 1 tablet (30 mg total) by mouth at bedtime. 30 tablet 5   omega-3 acid ethyl esters (LOVAZA) 1 g  capsule Take 2 capsules (2 g total) by mouth 2 (two) times daily. 120 capsule 11   ondansetron (ZOFRAN) 4 MG tablet Take 1 tablet (4 mg total) by mouth every 8 (eight) hours as needed for nausea or vomiting. 30 tablet 0   OVER THE COUNTER MEDICATION Take 1 capsule by mouth daily. 11 MUSHROOM BLEND     Polyethyl Glycol-Propyl Glycol (SYSTANE) 0.4-0.3 % SOLN Place 1 drop into both eyes 3 (three) times daily as needed (dry eyes). Place 1 drop into both eyes 3 (three) times daily as needed (dry eyes). 3 mL 2   rosuvastatin (CRESTOR) 10 MG tablet Take 1 tablet (10 mg total) by mouth daily. 2nd attempt, patient needs an appt for additional refills 15 tablet 0   sodium chloride (OCEAN) 0.65 % SOLN nasal spray Place 1 spray into both nostrils as needed for congestion. 30 mL  1   ALPRAZolam (XANAX) 0.5 MG tablet Take 1 tablet (0.5 mg total) by mouth 3 (three) times daily as needed for anxiety (panic attacks). 90 tablet 0   No facility-administered medications prior to visit.    ROS: Review of Systems  Constitutional:  Positive for fatigue. Negative for activity change, appetite change, chills and unexpected weight change.  HENT:  Negative for congestion, mouth sores, sinus pressure and voice change.   Eyes:  Negative for visual disturbance.  Respiratory:  Positive for cough and shortness of breath. Negative for chest tightness and wheezing.   Gastrointestinal:  Negative for abdominal pain and nausea.  Genitourinary:  Positive for urgency. Negative for difficulty urinating, frequency and vaginal pain.  Musculoskeletal:  Positive for arthralgias. Negative for back pain and gait problem.  Skin:  Negative for pallor and rash.  Neurological:  Positive for weakness, light-headedness and headaches. Negative for dizziness, tremors, syncope and numbness.  Hematological:  Bruises/bleeds easily.  Psychiatric/Behavioral:  Positive for decreased concentration. Negative for behavioral problems, confusion, sleep disturbance and suicidal ideas.     Objective:  BP 130/88   Pulse 72   Temp 98.4 F (36.9 C) (Oral)   Ht 5\' 1"  (1.549 m)   Wt 176 lb (79.8 kg)   SpO2 95%   BMI 33.25 kg/m   BP Readings from Last 3 Encounters:  07/28/23 130/88  07/12/23 112/80  04/26/23 134/72    Wt Readings from Last 3 Encounters:  07/28/23 176 lb (79.8 kg)  07/22/23 175 lb (79.4 kg)  07/12/23 175 lb 3.2 oz (79.5 kg)    Physical Exam Constitutional:      General: She is not in acute distress.    Appearance: She is well-developed. She is obese.  HENT:     Head: Normocephalic.     Right Ear: External ear normal.     Left Ear: External ear normal.     Nose: Nose normal.  Eyes:     General:        Right eye: No discharge.        Left eye: No discharge.     Conjunctiva/sclera:  Conjunctivae normal.     Pupils: Pupils are equal, round, and reactive to light.  Neck:     Thyroid: No thyromegaly.     Vascular: No JVD.     Trachea: No tracheal deviation.  Cardiovascular:     Rate and Rhythm: Normal rate and regular rhythm.     Heart sounds: Normal heart sounds.  Pulmonary:     Effort: No respiratory distress.     Breath sounds: No stridor. No wheezing.  Abdominal:     General: Bowel sounds are normal. There is no distension.     Palpations: Abdomen is soft. There is no mass.     Tenderness: There is no abdominal tenderness. There is no guarding or rebound.  Musculoskeletal:        General: Tenderness present.     Cervical back: Normal range of motion and neck supple. No rigidity.     Right lower leg: No edema.     Left lower leg: No edema.  Lymphadenopathy:     Cervical: No cervical adenopathy.  Skin:    Findings: No erythema or rash.  Neurological:     Mental Status: Mental status is at baseline.     Cranial Nerves: No cranial nerve deficit.     Motor: No abnormal muscle tone.     Coordination: Coordination normal.     Deep Tendon Reflexes: Reflexes normal.  Psychiatric:        Behavior: Behavior normal.        Thought Content: Thought content normal.        Judgment: Judgment normal.   Using a walker sad  Lab Results  Component Value Date   WBC 7.1 07/12/2023   HGB 12.0 07/12/2023   HCT 37.3 07/12/2023   PLT 296 07/12/2023   GLUCOSE 100 (H) 07/12/2023   CHOL 277 (H) 09/23/2022   TRIG 276 (H) 09/23/2022   HDL 51 09/23/2022   LDLDIRECT 160.0 10/18/2018   LDLCALC 174 (H) 09/23/2022   ALT 18 03/09/2023   AST 21 03/09/2023   NA 141 07/12/2023   K 4.3 07/12/2023   CL 106 07/12/2023   CREATININE 1.07 (H) 07/12/2023   BUN 21 07/12/2023   CO2 20 07/12/2023   TSH 4.32 03/09/2023   INR 1.0 07/15/2020   HGBA1C 5.8 03/01/2022   MICROALBUR 0.8 12/16/2021    No results found.  Assessment & Plan:   Problem List Items Addressed This Visit      Bipolar affective disorder, current episode depressed (HCC)    FTT: planning to move to an assisted living - situational worsening      Relevant Orders   Comprehensive metabolic panel   CBC with Differential/Platelet   TSH   T4, free   Urinalysis   Vitamin B12   CKD stage 3b, GFR 30-44 ml/min (HCC)   Hydrate well      Relevant Orders   Comprehensive metabolic panel   CBC with Differential/Platelet   TSH   T4, free   Urinalysis   Vitamin B12   Dyspnea on exertion   Chronic: planning to move to an assisted living -  general worsening Will get labs      Relevant Orders   Comprehensive metabolic panel   CBC with Differential/Platelet   TSH   T4, free   Urinalysis   Vitamin B12   Leiomyosarcoma (HCC)    FTT: planning to move to an assisted living - worsening weakness      Relevant Orders   Comprehensive metabolic panel   CBC with Differential/Platelet   TSH   T4, free   Urinalysis   Vitamin B12   Paresthesia   Relevant Orders   TSH   Vitamin B12   Other Visit Diagnoses       Weakness    -  Primary         No orders of the defined types were placed in this encounter.     Follow-up: Return in about 6 weeks (around  09/08/2023) for a follow-up visit.  Sonda Primes, MD

## 2023-07-28 NOTE — Assessment & Plan Note (Signed)
 Chronic: planning to move to an assisted living -  general worsening Will get labs

## 2023-07-29 ENCOUNTER — Other Ambulatory Visit: Payer: Self-pay | Admitting: Gynecologic Oncology

## 2023-07-29 DIAGNOSIS — Z0000 Encounter for general adult medical examination without abnormal findings: Secondary | ICD-10-CM

## 2023-07-29 LAB — VITAMIN B12: Vitamin B-12: 830 pg/mL (ref 211–911)

## 2023-07-29 LAB — T4, FREE: Free T4: 0.8 ng/dL (ref 0.60–1.60)

## 2023-07-29 LAB — TSH: TSH: 2.76 u[IU]/mL (ref 0.35–5.50)

## 2023-08-01 ENCOUNTER — Encounter: Payer: Self-pay | Admitting: Internal Medicine

## 2023-08-01 ENCOUNTER — Other Ambulatory Visit: Payer: 59

## 2023-08-01 ENCOUNTER — Other Ambulatory Visit (HOSPITAL_COMMUNITY): Payer: Self-pay

## 2023-08-01 ENCOUNTER — Encounter (HOSPITAL_COMMUNITY): Payer: Self-pay | Admitting: Pharmacist

## 2023-08-01 ENCOUNTER — Other Ambulatory Visit: Payer: Self-pay

## 2023-08-01 ENCOUNTER — Other Ambulatory Visit: Payer: Self-pay | Admitting: Internal Medicine

## 2023-08-01 MED ORDER — ASPIRIN 81 MG PO TBEC
81.0000 mg | DELAYED_RELEASE_TABLET | Freq: Every day | ORAL | 3 refills | Status: DC
  Filled 2023-08-01 – 2023-09-13 (×6): qty 30, 30d supply, fill #0
  Filled 2023-10-03 – 2023-10-06 (×2): qty 30, 30d supply, fill #1
  Filled 2023-10-26 – 2023-11-15 (×3): qty 30, 30d supply, fill #2
  Filled 2023-11-17 – 2023-12-08 (×3): qty 30, 30d supply, fill #3

## 2023-08-01 MED ORDER — ALPRAZOLAM 1 MG PO TABS
1.0000 mg | ORAL_TABLET | Freq: Three times a day (TID) | ORAL | 1 refills | Status: DC | PRN
  Filled 2023-08-01 – 2023-08-26 (×2): qty 90, 30d supply, fill #0
  Filled 2023-08-31 – 2023-10-03 (×3): qty 90, 30d supply, fill #1

## 2023-08-01 MED ORDER — LOPERAMIDE HCL 2 MG PO TABS
2.0000 mg | ORAL_TABLET | Freq: Four times a day (QID) | ORAL | 3 refills | Status: DC | PRN
  Filled 2023-08-01: qty 24, 6d supply, fill #0
  Filled 2023-11-17: qty 24, 6d supply, fill #1

## 2023-08-01 MED ORDER — IPRATROPIUM-ALBUTEROL 0.5-2.5 (3) MG/3ML IN SOLN
3.0000 mL | Freq: Four times a day (QID) | RESPIRATORY_TRACT | 3 refills | Status: DC | PRN
  Filled 2023-08-01: qty 360, 30d supply, fill #0

## 2023-08-01 MED ORDER — HYDROCODONE-ACETAMINOPHEN 5-325 MG PO TABS
1.0000 | ORAL_TABLET | Freq: Four times a day (QID) | ORAL | 0 refills | Status: DC | PRN
  Filled 2023-08-01: qty 30, 7d supply, fill #0

## 2023-08-02 ENCOUNTER — Ambulatory Visit: Payer: Self-pay | Admitting: Licensed Clinical Social Worker

## 2023-08-02 ENCOUNTER — Other Ambulatory Visit (HOSPITAL_COMMUNITY): Payer: Self-pay

## 2023-08-02 ENCOUNTER — Other Ambulatory Visit: Payer: Self-pay

## 2023-08-02 NOTE — Patient Outreach (Signed)
 Care Coordination   Initial Visit Note   08/02/2023 Name: [name] MRN: [id number] DOB: 1950/06/10  [name] is a 73 y.o. year old female who sees Plotnikov, Georgina Quint, MD for primary care. I spoke with  [name] by phone today.  What matters to the patients health and wellness today?  Patient feels that she may need ALF level of care soon    Goals Addressed             This Visit's Progress    Patient feels that she may need ALF level of care soon       Interventions:  Spoke with client today about client needs and status [name] said she feels as if she needs ALF level of care soon. She said she has difficultly with memory and with concentration. She feels that her cognitive ability has declined. She said she has difficulty reading and recalling information Client shared that she had scan last year (CT brain scan) Regarding ADLs , she said she is having more difficulty with ADLs. Pain issues occur daily; she spoke of having fibromyalgia. She said she gets dizzy and is at risk for falls Regarding ambulation, she uses a walker to walk,  She has to take rest breaks because she gets dizzy Regarding appetite, she has reduced appetite.   Discussed medications procurement.  Discussed transport needs. She said she gets transport help through her insurance, which is [employer]. Program which helps her with [facility] is called Safe Ride program Client said she is sleep often, and has low energy.  She is sleeping more often Regarding family support, client does not have any relatives nearby. Brother resides in [place], [place]. Regarding mental health support, she sees Dr. Donell Beers, psychiatrist. She also receives support from psychologist Dr. Dellia Cloud. She has received support with Dr. Dellia Cloud for over 20 years. She feels that support from Dr. Dellia Cloud is very helpful to her. Discussed vision of client. She feels that she needs new glasses Regarding LOC issues,  she spoke of ALF interests.  She spoke with PCP about FL-2 completion. FL-2 has been completed by PCP.  Client plans to call PCP office today to see if office rep can fax FL-2 and H and P for client to [facility] in [place], [place] Client spoke of financial situation. She receives Social Security benefit each month. This is her only income.  She plans to talk with [facility] rep about medicaid eligibility for prospective residents.  Discussed client history of cancer She said she does chair yoga to help her relax She feels that her weight is stable at present.  She does feel that she is more sedentary at present. She spoke of chronic inflammation issues regarding multiple surgeries. She has had 12 surgeries in past 12 years. She said she speaks with her brother occasionally via phone.  Her mother is living nearby. Thanked client for phone call with LCSW today Client has LCSW name and phone number. LCSW encouraged client to call LCSW as needed for SW support Client was appreciative of call from LCSW today          SDOH assessments and interventions completed:  Yes  SDOH Interventions Today    Flowsheet Row Most Recent Value  SDOH Interventions   Depression Interventions/Treatment  Currently on Treatment  Physical Activity Interventions Other (Comments)  [at risk for falls,  uses a walker to help her walk,  decreased energy]  Stress Interventions Other (Comment)  [  has stress related to managing ADLs and medical needs]        Care Coordination Interventions:  Yes, provided  Interventions Today    Flowsheet Row Most Recent Value  Chronic Disease   Chronic disease during today's visit Other  [spoke with client about client needs]  General Interventions   General Interventions Discussed/Reviewed General Interventions Discussed, Community Resources  Education Interventions   Education Provided Provided Education  Provided Verbal Education On [employer]  Mental  Health Interventions   Mental Health Discussed/Reviewed Coping Strategies  [has anxiety and stress related to managing medical needs]  Nutrition Interventions   Nutrition Discussed/Reviewed Nutrition Discussed  Pharmacy Interventions   Pharmacy Dicussed/Reviewed Pharmacy Topics Discussed  Safety Interventions   Safety Discussed/Reviewed Fall Risk        Follow up plan: Follow up call scheduled for 09/05/23 at 2:30 PM     Encounter Outcome:  Patient Visit Completed    [name]  MSW, LCSW [place] Licensed Clinical Social Worker Direct Dial:  [phone] Fax:  [phone] Website:  [email or web address]

## 2023-08-02 NOTE — Telephone Encounter (Signed)
 Copied from CRM [id number]. Topic: General - Other >> Aug 02, 2023 11:25 AM [place] Boast F wrote: Reason for CRM: Patient called to follow up on FLT2 and Physical /Health form paperwork, says the paperwork has to be faxed to [facility] in [place], their phone number is [phone] or try [phone]. Please call patient with an update.

## 2023-08-02 NOTE — Patient Instructions (Signed)
 Visit Information  Thank you for taking time to visit with me today. Please don't hesitate to contact me if I can be of assistance to you.   Following are the goals we discussed today:   Goals Addressed             This Visit's Progress    Patient feels that she may need ALF level of care soon       Interventions:  Spoke with client today about client needs and status Jamera Vanloan said she feels as if she needs ALF level of care soon. She said she has difficultly with memory and with concentration. She feels that her cognitive ability has declined. She said she has difficulty reading and recalling information Client shared that she had scan last year (CT brain scan) Regarding ADLs , she said she is having more difficulty with ADLs. Pain issues occur daily; she spoke of having fibromyalgia. She said she gets dizzy and is at risk for falls Regarding ambulation, she uses a walker to walk,  She has to take rest breaks because she gets dizzy Regarding appetite, she has reduced appetite.   Discussed medications procurement.  Discussed transport needs. She said she gets transport help through her insurance, which is [employer]. Program which helps her with [facility] is called Safe Ride program Client said she is sleep often, and has low energy.  She is sleeping more often Regarding family support, client does not have any relatives nearby. Brother resides in [place], [place]. Regarding mental health support, she sees Dr. Donell Beers, psychiatrist. She also receives support from psychologist Dr. Dellia Cloud. She has received support with Dr. Dellia Cloud for over 20 years. She feels that support from Dr. Dellia Cloud is very helpful to her. Discussed vision of client. She feels that she needs new glasses Regarding LOC issues, she spoke of ALF interests.  She spoke with PCP about FL-2 completion. FL-2 has been completed by PCP.  Client plans to call PCP office today to see if office rep can fax FL-2 and H and P for client  to [facility] in [place], [place] Client spoke of financial situation. She receives Social Security benefit each month. This is her only income.  She plans to talk with [facility] rep about medicaid eligibility for prospective residents.  Discussed client history of cancer She said she does chair yoga to help her relax She feels that her weight is stable at present.  She does feel that she is more sedentary at present. She spoke of chronic inflammation issues regarding multiple surgeries. She has had 12 surgeries in past 12 years. She said she speaks with her brother occasionally via phone.  Her mother is living nearby. Thanked client for phone call with LCSW today Client has LCSW name and phone number. LCSW encouraged client to call LCSW as needed for SW support Client was appreciative of call from LCSW today          Our next appointment is by telephone on 09/05/23 at 2:30 PM   Please call the care guide team at [phone] if you need to cancel or reschedule your appointment.   If you are experiencing a Mental Health or Behavioral Health Crisis or need someone to talk to, please go to [facility] [address], [place] [phone])   The patient verbalized understanding of instructions, educational materials, and care plan provided today and DECLINED offer to receive copy of patient instructions, educational materials, and care plan.  The patient has been provided with contact information for the care management team and has been advised to call with any health related questions or concerns.    [name]  MSW, LCSW [place]/[facility] Licensed Clinical Social Worker Direct Dial:  [phone] Fax:  [phone] Website:  [email or web address]

## 2023-08-03 ENCOUNTER — Ambulatory Visit: Payer: 59 | Admitting: Psychology

## 2023-08-03 DIAGNOSIS — F4323 Adjustment disorder with mixed anxiety and depressed mood: Secondary | ICD-10-CM | POA: Diagnosis not present

## 2023-08-03 NOTE — Progress Notes (Signed)
 [name] is a 73 y.o. 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  Symptoms A diagnosis of an acute, serious illness that is life-threatening. (Status: maintained) -- No Description Entered  Diminished interest in or enjoyment of activities. (Status: maintained) -- No Description Entered  Lack of energy. (Status: maintained) -- No Description Entered  Sad affect, social withdrawal, anxiety, loss of interest in activities, and low energy. (Status: maintained) -- No Description Entered  Medication Status compliance  Safety unspecified If Suicidal or Homicidal State Action Taken: unspecified  Current Risk:  Medications Buspar (Dosage: 30mg  2X/day)  New Medication (Dosage: 250mg  2x/day)  New Medication (Dosage: 250mg  2x/day)  Prozac (Dosage: 40mg  daily)  Remeron (Dosage: 30mg  nightly)  xanax (Dosage: .5mg  2-3X/day)  Objectives Related Problem: Recognize, accept, and cope with feelings of depression. Description: Identify and replace thoughts and beliefs that support depression. Target Date: 2024-04-28 Frequency: Daily Modality: individual Progress: 80%  Related Problem: Recognize, accept, and cope with feelings of depression. Description: Verbalize an understanding of healthy and unhealthy emotions with the intent of increasing the  use of healthy emotions to guide actions. Target Date: 2024-04-28 Frequency: Daily Modality: individual Progress: 85%  Related Problem: Recognize, accept, and cope with feelings of depression. Description: Increasingly verbalize hopeful and positive statements regarding self, others, and the future. Target Date: 2022-04-28 Frequency: Daily Modality: individual Progress: 100%  Related Problem: Reduce fear, anxiety, and worry associated with the medical condition. Description: Share with significant others efforts to adapt successfully to the medical condition/diagnosis. Target Date: 2024-04-28 Frequency: Daily Modality: individual Progress: 85%  Related Problem: Reduce fear, anxiety, and worry associated with the medical condition. Description: Engage in social, productive, and recreational activities that are possible in spite of medical condition. Target Date: 2024-04-28 Frequency:  Daily Modality: individual Progress: 90%  Related Problem: Reduce fear, anxiety, and worry associated with the medical condition. Description: Identify the coping skills and sources of emotional support that have been beneficial in the past. Target Date: 2024-04-28 Frequency: Daily Modality: individual Progress: 90%  Client Response full compliance  Service Location Location, [address] [place], [place] [address]  Service Code cpt [phone]  Related past to present  Facilitate problem solving  Identify/label emotions  Validate/empathize  Self care activities  Lifestyle change (exercise, nutrition)  Assess/facilitate readiness to change  Relaxation training  Rationally challenge thoughts or beliefs/cognitive restructuring  Comments  Dx: F43.21  Meds: Prozac, [name], patient is struggling to rebuild her life and adjust to her new circumstances. She suffers a negative self-concept and has little confidence in her judgement. She  is seeking to reduce symptoms of anxiety and self-doubt and to re-define satisfa ction moving forward. Therapy will focus on cognitive restructuring to redefine her experiences and focus on those satisfying aspects of her life. Will target this goal completion for April1, 2021 (completed). She is also wanting to utilize counseling to adjust and accept her latest diagnosis of cancer. She wants assistance to better manage this news and remain positive throughout treatment. Goal date is 12-22. Patient has made progress, but her medical condition is variable and she requests additional treatment to manage moods throughout her medical ordeal. In addition, she is trying to manage feelings associated with her older son's mental health struggles. Revised goal date is 12-25.   Patient agrees to a video session and is aware of the limitations of this platform. She is at home and I am in home office.   [name]: She states tat she is "still in bed sick" saw Dr. Posey Rea and told him she needs to go to assisted living. She says she is mentally and physically done. She spoke with a Child psychotherapist at hospital and was told that even with low income, she may qualify to get help to get into a very good facility. She feels she is getting weaker all the time and she has lost her appetite. She is sleeping much of the time and is unable to determine why she has no energy.  [name] Time: 3:15p-4:00p 45 minutes

## 2023-08-03 NOTE — Telephone Encounter (Signed)
 Copied from CRM [id number]. Topic: General - Other >> Jul 21, 2023 10:20 AM Fredrich Romans wrote: Reason for CRM: Patient called in demanding to speak  to plotnikov nurse regarding him placing an order for her to be placed into an assistant living  facility. >> Aug 03, 2023  2:07 PM Elease Hashimoto J wrote: Patient is calling regarding status of FL2 forms needing to be completed by provider. Contacted [employer], but was on lunch. She spoke with social worker yesterday, who advised she may be able to get a bed at [facility] due to receiving a government subsidy. Her mother resides at [facility] as well. She requests a call back with an update  [facility] [phone]  Patient's CB# [phone]

## 2023-08-04 ENCOUNTER — Ambulatory Visit: Payer: Self-pay

## 2023-08-04 NOTE — Patient Outreach (Signed)
 Care Coordination   Follow Up Visit Note   08/04/2023 Name: [name] MRN: [id number] DOB: [birth year]  [name] is a 73 y.o. year old female who sees Plotnikov, Georgina Quint, MD for primary care. I spoke with  [name] by phone today.  What matters to the patients health and wellness today?  [name] reports trying to get into assisted living due to limiting abilities to care for self. PCP face to face visit completed with PCP on 07/28/23. Patient states her #1 choice is [facility], as her mother is there, and she is familiar with the facility. However, she states she is willing to look into any facility that will accept her. She states she will ask her Aunt/uncle to assist her with meals. She is active with clinical social work.  Goals Addressed             This Visit's Progress    Assist with Health management       Interventions Today    Flowsheet Row Most Recent Value  Chronic Disease   Chronic disease during today's visit Other  General Interventions   General Interventions Discussed/Reviewed Level of Care, Doctor Visits, Communication with  Doctor Visits Discussed/Reviewed PCP  PCP/Specialist Visits Compliance with follow-up visit  [reviewed upcoming appointments with patient]  Communication with PCP/Specialists, Social Work  Presenter, broadcasting with PCP to update on patient's preferance, but willing to look at any agency that will accept her.]  Level of Care Assisted Living  Education Interventions   Education Provided Provided Education  Provided Verbal Education On Other, Mental Health/Coping with Illness, Medication, When to see the doctor  [advised to continue to take medications as prescribed, attend provider appointments as scheduled, contact provider with health questions or concerns]  Nutrition Interventions   Nutrition Discussed/Reviewed Nutrition Discussed  [encouraged patient to ask if family friends can assist with meals]  Pharmacy  Interventions   Pharmacy Dicussed/Reviewed Pharmacy Topics Reviewed  Safety Interventions   Safety Discussed/Reviewed Fall Risk, Safety Reviewed, Home Safety  [fall prevention discussed]  Home Safety Contact provider for referral to PT/OT, Assistive Devices            SDOH assessments and interventions completed:  No  Care Coordination Interventions:  Yes, provided   Follow up plan: Follow up call scheduled for 08/15/23    Encounter Outcome:  Patient Visit Completed   Kathyrn Sheriff, RN, MSN, BSN, CCM   [facility], Population Health Case Manager Phone: [phone]

## 2023-08-04 NOTE — Patient Instructions (Signed)
 Visit Information  Thank you for taking time to visit with me today. Please don't hesitate to contact me if I can be of assistance to you.   Following are the goals we discussed today:  Continue to take medications as prescribed. Continue to attend provider visits as scheduled Continue to eat healthy, lean meats, vegetables, fruits, avoid saturated and transfats Contact provider with health questions or concerns as needed  Our next appointment is by telephone on 08/15/23 at 11:30 am  Please call the care guide team at [phone] if you need to cancel or reschedule your appointment.   If you are experiencing a Mental Health or Behavioral Health Crisis or need someone to talk to, please call the Suicide and Crisis Lifeline: 988 call the [place] National Suicide Prevention Lifeline: (971)435-3931 or TTY: (623) 162-8043 TTY (616)343-1397) to talk to a trained counselor  Kathyrn Sheriff, RN, MSN, BSN, CCM [place]  [facility], Population Health Case Manager Phone: [phone]

## 2023-08-05 NOTE — Telephone Encounter (Signed)
 Spoke with the pt informing her that FL2 has been filled out and placed on provider's desk to be signed and once this is done we will fax all the forms over to [facility] at [phone].

## 2023-08-08 ENCOUNTER — Ambulatory Visit (INDEPENDENT_AMBULATORY_CARE_PROVIDER_SITE_OTHER): Payer: 59 | Admitting: Psychology

## 2023-08-08 ENCOUNTER — Telehealth: Payer: Self-pay | Admitting: Internal Medicine

## 2023-08-08 ENCOUNTER — Ambulatory Visit: Payer: Self-pay | Admitting: Licensed Clinical Social Worker

## 2023-08-08 ENCOUNTER — Other Ambulatory Visit: Payer: Self-pay

## 2023-08-08 DIAGNOSIS — F4323 Adjustment disorder with mixed anxiety and depressed mood: Secondary | ICD-10-CM

## 2023-08-08 DIAGNOSIS — F4321 Adjustment disorder with depressed mood: Secondary | ICD-10-CM

## 2023-08-08 NOTE — Progress Notes (Signed)
 [name] is a 73 y.o. 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  Symptoms A diagnosis of an acute, serious illness that is life-threatening. (Status: maintained) -- No Description Entered  Diminished interest in or enjoyment of activities. (Status: maintained) -- No Description Entered  Lack of energy. (Status: maintained) -- No Description Entered  Sad affect, social withdrawal, anxiety, loss of interest in activities, and low energy. (Status: maintained) -- No Description Entered  Medication Status compliance  Safety unspecified If Suicidal or Homicidal State Action Taken: unspecified  Current Risk:  Medications Buspar (Dosage: 30mg  2X/day)  New Medication (Dosage: 250mg  2x/day)  New Medication (Dosage: 250mg  2x/day)  Prozac (Dosage: 40mg  daily)  Remeron (Dosage: 30mg  nightly)  xanax (Dosage: .5mg  2-3X/day)  Objectives Related Problem: Recognize, accept, and cope with feelings of depression. Description: Identify and replace thoughts and beliefs that support depression. Target Date: 2024-04-28 Frequency: Daily Modality: individual Progress: 80%  Related Problem: Recognize, accept, and cope with feelings of depression. Description: Verbalize an understanding of healthy and unhealthy emotions with the intent of increasing the use of healthy emotions to guide actions. Target Date: 2024-04-28 Frequency: Daily Modality: individual Progress: 85%  Related Problem: Recognize, accept, and cope with feelings of depression. Description: Increasingly verbalize hopeful and positive statements regarding self, others, and the future. Target Date: 2022-04-28 Frequency: Daily Modality: individual Progress: 100%  Related Problem: Reduce fear, anxiety, and worry associated with the medical condition. Description: Share with significant others efforts to adapt successfully to the medical  condition/diagnosis. Target Date: 2024-04-28 Frequency: Daily Modality: individual Progress: 85%  Related Problem: Reduce fear, anxiety, and worry associated with the medical condition. Description: Engage in social, productive, and recreational activities that are possible in spite of medical condition. Target Date: 2024-04-28 Frequency: Daily Modality: individual Progress: 90%  Related Problem: Reduce fear, anxiety, and worry associated with the medical condition. Description: Identify the coping skills and sources of emotional support that have been beneficial in the past. Target Date: 2024-04-28 Frequency: Daily Modality: individual Progress: 90%  Client Response full compliance  Service Location Location, [address] [place], [place] [address]  Service Code cpt [phone]  Related past to present  Facilitate problem solving  Identify/label emotions  Validate/empathize  Self care activities  Lifestyle change (exercise, nutrition)  Assess/facilitate readiness to change  Relaxation training  Rationally challenge thoughts or beliefs/cognitive restructuring  Comments  Dx: F43.21  Meds: Prozac, [name], patient is struggling to rebuild her life and adjust to her new circumstances. She suffers a negative self-concept and has little confidence in her judgement. She is seeking to reduce symptoms of anxiety and self-doubt and to re-define satisfa ction moving forward. Therapy will focus on cognitive restructuring to redefine her experiences and focus on those satisfying aspects of her life. Will target this goal completion for April1, 2021 (completed). She is also wanting to utilize counseling to adjust and accept her latest diagnosis of cancer. She wants assistance to better manage this news and remain positive throughout treatment. Goal date is 12-22. Patient has made progress, but her medical condition is variable and  she requests additional treatment to manage moods throughout her medical ordeal. In addition, she is trying to manage feelings associated with her older son's mental health struggles. Revised goal date is 12-25.   Patient agrees to a video session and is aware of the limitations of  this platform. She is at home and I am in home office.   [name]: She states she has continued to watch a lot of televison and movies. She is exercising 3 times/day for 10 minutes each. She says the rest of the day is "groundhogs day". Everything is hurting and she is not able to get contro over all the medical issues. Dr. Elson Areas told her he has sent in the request for [name] to be referred to a residential facility. She says she thinks that she needs to go if she is accepted in to one of the nicer facilities (like [name]). We discussed the transition and best way to manage.                                                                                                                                                                                                                                                                                  [name] Time: 4:15p-5:00p 45 minutes                                Charlotte Ridgel, PhD

## 2023-08-08 NOTE — Patient Instructions (Signed)
 Visit Information  Thank you for taking time to visit with me today. Please don't hesitate to contact me if I can be of assistance to you.   Following are the goals we discussed today:   Goals Addressed             This Visit's Progress    Patient feels that she may need ALF level of care soon       Interventions:  Spoke with client today about client needs and status Informed her that Kathyrn Sheriff RN had asked if HHPT may be helpful to client at this time (HHPT and HHAide). Client has said she is dizzy, takes rest breaks and is fearful of falling. LCSW contacted office of Dr. Shirl Harris today.  Spoke with receptionist and mentioned that RN Kathyrn Sheriff was wondering if HHPT and HHAide might be helpful to client at this time .  Receptionist at PCP office to share information with PCP to determine if PCP wishes to make decision about this issue for client for in home support Tarra Pence said she feels as if she needs ALF level of care soon. She said she has difficultly with memory and with concentration. She feels that her cognitive ability has declined. She said she has difficulty reading and recalling information Client said that PCP office had completed FL-2 for her and faxed FL-2 to [facility] on last Friday Regarding ambulation, she uses a walker to walk,  She has to take rest breaks because she gets dizzy Regarding appetite, she has reduced appetite.   LCSW talked with client about self care of client.  Client said she has a uncle who visits her weekly and often will bring food to her.  Client said she has reduced energy and it is hard for her to prepare meals for herself. Thanked client for phone call with LCSW today Client has LCSW name and phone number. LCSW encouraged client to call LCSW as needed for SW support Client was appreciative of call from LCSW today         Client has LCSW name and phone number.  LCSW encouraged Layli Capshaw to call LCSW as needed for SW  support at [phone]   Please call the care guide team at [phone] if you need to cancel or reschedule your appointment.   If you are experiencing a Mental Health or Behavioral Health Crisis or need someone to talk to, please go to [facility] [address], [place] [phone])   The patient verbalized understanding of instructions, educational materials, and care plan provided today and DECLINED offer to receive copy of patient instructions, educational materials, and care plan.   The patient has been provided with contact information for the care management team and has been advised to call with any health related questions or concerns.    [name]  MSW, LCSW [place]/[facility] Licensed Clinical Social Worker Direct Dial:  [phone] Fax:  [phone] Website:  [email or web address]

## 2023-08-08 NOTE — Patient Outreach (Signed)
 Care Coordination   Follow Up Visit Note   08/08/2023 Name: [name] MRN: [id number] DOB: November 28, 1950  [name] is a 73 y.o. year old female who sees Plotnikov, Georgina Quint, MD for primary care. I spoke with  [name] by phone today.  What matters to the patients health and wellness today? Patient feels that she may need ALF level of care soon     Goals Addressed             This Visit's Progress    Patient feels that she may need ALF level of care soon       Interventions:  Spoke with client today about client needs and status Informed her that Kathyrn Sheriff RN had asked if HHPT may be helpful to client at this time (HHPT and HHAide). Client has said she is dizzy, takes rest breaks and is fearful of falling. LCSW contacted office of Dr. Shirl Harris today.  Spoke with receptionist and mentioned that RN Kathyrn Sheriff was wondering if HHPT and HHAide might be helpful to client at this time .  Receptionist at PCP office to share information with PCP to determine if PCP wishes to make decision about this issue for client for in home support [name] said she feels as if she needs ALF level of care soon. She said she has difficultly with memory and with concentration. She feels that her cognitive ability has declined. She said she has difficulty reading and recalling information Client said that PCP office had completed FL-2 for her and faxed FL-2 to [facility] on last Friday Regarding ambulation, she uses a walker to walk,  She has to take rest breaks because she gets dizzy Regarding appetite, she has reduced appetite.   LCSW talked with client about self care of client.  Client said she has a uncle who visits her weekly and often will bring food to her.  Client said she has reduced energy and it is hard for her to prepare meals for herself. Thanked client for phone call with LCSW today Client has LCSW name and phone number. LCSW encouraged client to  call LCSW as needed for SW support Client was appreciative of call from LCSW today          SDOH assessments and interventions completed:  Yes  SDOH Interventions Today    Flowsheet Row Most Recent Value  SDOH Interventions   Depression Interventions/Treatment  Currently on Treatment  Physical Activity Interventions Other (Comments)  [uses a walker to help her walk. dizzy occasionally, at risk of fall]  Stress Interventions Other (Comment)  [has stress related to managing medical needs]        Care Coordination Interventions:  Yes, provided   Follow up plan: Client has LCSW name and phone number. Encouraged [name] to call LCSW as needed for SW support at [phone].   Encounter Outcome:  Patient Visit Completed    [name]  MSW, LCSW [place]/[facility] Licensed Clinical Social Worker Direct Dial:  [phone] Fax:  [phone] Website:  [email or web address]

## 2023-08-08 NOTE — Telephone Encounter (Signed)
 Copied from CRM [id number]. Topic: Clinical - Medical Advice >> Aug 08, 2023 11:55 AM Deaijah H wrote: Reason for CRM: Casimiro Needle [facility] called in to know if Dr. Shirl Harris would think physical therapy orders in patients home would be helpful due to her being dizzy , walker to walk, risk to falls, and rest breaks. Nurse would like to know. Call back [phone] Casimiro Needle)

## 2023-08-09 ENCOUNTER — Other Ambulatory Visit: Payer: Self-pay

## 2023-08-09 ENCOUNTER — Telehealth: Payer: Self-pay | Admitting: Cardiology

## 2023-08-09 ENCOUNTER — Other Ambulatory Visit (HOSPITAL_COMMUNITY): Payer: Self-pay

## 2023-08-09 DIAGNOSIS — Z79899 Other long term (current) drug therapy: Secondary | ICD-10-CM

## 2023-08-09 DIAGNOSIS — I10 Essential (primary) hypertension: Secondary | ICD-10-CM

## 2023-08-09 MED ORDER — CLONIDINE HCL 0.2 MG PO TABS
0.2000 mg | ORAL_TABLET | Freq: Every day | ORAL | 3 refills | Status: AC
  Filled 2023-08-09: qty 30, 30d supply, fill #0
  Filled 2023-08-10: qty 90, 90d supply, fill #0
  Filled 2023-10-26: qty 90, 90d supply, fill #1
  Filled 2023-11-08: qty 30, 30d supply, fill #1
  Filled 2023-11-17 – 2023-12-29 (×2): qty 90, 90d supply, fill #1

## 2023-08-09 NOTE — Telephone Encounter (Signed)
*  STAT* If patient is at the pharmacy, call can be transferred to refill team.   1. Which medications need to be refilled? (please list name of each medication and dose if known)   cloNIDine (CATAPRES) 0.2 MG tablet     2. Would you like to learn more about the convenience, safety, & potential cost savings by using the [facility]? No   3. Are you open to using the [facility] (Type [facility].  ).No   4. Which pharmacy/location (including street and city if local pharmacy) is medication to be sent to?[facility]    5. Do they need a 30 day or 90 day supply? 90 day   Pt was seen on 07/12/23

## 2023-08-10 ENCOUNTER — Other Ambulatory Visit

## 2023-08-10 ENCOUNTER — Other Ambulatory Visit: Payer: Self-pay

## 2023-08-10 ENCOUNTER — Other Ambulatory Visit (HOSPITAL_COMMUNITY): Payer: Self-pay

## 2023-08-11 ENCOUNTER — Encounter

## 2023-08-11 NOTE — Telephone Encounter (Signed)
 Home PT was ordered through [facility] care in January 2025.  Thanks

## 2023-08-15 ENCOUNTER — Ambulatory Visit: Payer: Self-pay | Admitting: Licensed Clinical Social Worker

## 2023-08-15 ENCOUNTER — Ambulatory Visit: Payer: Self-pay

## 2023-08-15 DIAGNOSIS — M797 Fibromyalgia: Secondary | ICD-10-CM

## 2023-08-15 DIAGNOSIS — I10 Essential (primary) hypertension: Secondary | ICD-10-CM

## 2023-08-15 NOTE — Patient Instructions (Signed)
 Visit Information  Thank you for taking time to visit with me today. Please don't hesitate to contact me if I can be of assistance to you.   Following are the goals we discussed today:  Continue to take medications as prescribed. Continue to attend provider visits as scheduled Continue to eat healthy, lean meats, vegetables, fruits, avoid saturated and transfats Contact provider with health questions or concerns as needed Continue to check blood pressure routinely and contact provider if questions or concerns   Our next appointment is by telephone on 08/25/23 at 2:00 pm  Please call the care guide team at [phone] if you need to cancel or reschedule your appointment.   If you are experiencing a Mental Health or Behavioral Health Crisis or need someone to talk to, please call the Suicide and Crisis Lifeline: 988 call the [place] National Suicide Prevention Lifeline: (812)523-6442 or TTY: 804-015-0993 TTY 934-245-5068) to talk to a trained counselor  Kathyrn Sheriff, RN, MSN, BSN, CCM [place] [facility]  [facility], Population Health Case Manager Phone: [phone]

## 2023-08-15 NOTE — Patient Outreach (Signed)
 Care Coordination   Follow Up Visit Note   08/15/2023 Name: [name] MRN: [id number] DOB: [birth year]  [name] is a 73 y.o. year old female who sees Plotnikov, Georgina Quint, MD for primary care. I spoke with  [name] by phone today.  What matters to the patients health and wellness today?  I am ok alone, but report difficulty with fibromyalgia symptoms, pain, weakness. She reports she has "plenty of food", in addition her Aunt/uncle check on her routinely. And she states she can call on church neighbors, if needed. However, she feels she is not getting any better since hip surgery. Reports she is exhausted-chronic fatigue and fibromyalgia, dizziness and weakness. Reports a system in place to ensure she is eating her meals, snacks and taking medicines. Patient reports she was discharged from [facility] in February. Reports BP checked daily-last checked 124/66 and denies any problems with B. Life alert call [employer], care guide for life alert. Provided [employer] [place](assistance with cleaning out house). Referral to clinical pharmacy to review medications for possible side effects contributing to patient symptoms.   Goals Addressed             This Visit's Progress    Assist with Health management       Interventions Today    Flowsheet Row Most Recent Value  Chronic Disease   Chronic disease during today's visit Other  [fibromyalgia]  General Interventions   General Interventions Discussed/Reviewed General Interventions Reviewed, Communication with  Doctor Visits Discussed/Reviewed Doctor Visits Reviewed, PCP  PCP/Specialist Visits Compliance with follow-up visit  Communication with PCP/Specialists, Social Work  [updated LCSW regarding patient status awaiting information regarding LOC needs. update PCP regarding home health therapy/bath aid referral.]  Level of Care Assisted Living  Exercise Interventions   Exercise Discussed/Reviewed Physical Activity   Education Interventions   Education Provided Provided Education  Provided Verbal Education On Nutrition, Medication, When to see the doctor, [employer], Mental Health/Coping with Illness  [discussed benefits of life alert system-referral to care guide, encouraged patient to contact [employer] [place] re: organization that can assist her with cleaning out her home. Encouraged patient to also follow up with insurance company.]  Mental Health Interventions   Mental Health Discussed/Reviewed Mental Health Reviewed  Jorje Guild listening. encouraged patient to continue to follow up with therapy sessions as scheduled]  Nutrition Interventions   Nutrition Discussed/Reviewed Nutrition Reviewed  Pharmacy Interventions   Pharmacy Dicussed/Reviewed Pharmacy Topics Reviewed  Safety Interventions   Safety Discussed/Reviewed Fall Risk, Home Safety  Home Safety Refer for community resources, Contact provider for referral to PT/OT, Assistive Devices            SDOH assessments and interventions completed:  No  Care Coordination Interventions:  Yes, provided   Follow up plan: Follow up call scheduled for 08/25/23    Encounter Outcome:  Patient Visit Completed   Kathyrn Sheriff, RN, MSN, BSN, CCM [place]  [facility], Population Health Case Manager Phone: [phone]

## 2023-08-15 NOTE — Patient Outreach (Signed)
 Care Coordination   Follow Up Visit Note   08/15/2023 Name: [name] MRN: [id number] DOB: 1950-10-07  [name] is a 73 y.o. year old female who sees Plotnikov, Georgina Quint, MD for primary care. I spoke with  [name] by phone today.  What matters to the patients health and wellness today? Patient feels that she may need ALF level of care soon    Goals Addressed             This Visit's Progress    Patient feels that she may need ALF level of care soon       Interventions:  Spoke with client today about client needs and status Client spoke today via phone with RN Kathyrn Sheriff LCSW called [facility] today and asked to speak with admission counselor. Admission counselor was not available.  Receptionist asked LCSW to call back at 1:30 PM today to speak at that time with Admission counselor . LCSW returned call to [facility] this afternoon but did not reach Admission Counselor. LCSW requested that Admissions counselor call LCSW today , if available, to speak about status of admission request for client LCSW shared this information today via phone with client Client does feel that she needs another level of care. Client has appointment this week with PCP and wants to talk more with PCP about LOC concerns she has. Of note, PCP office did send client FL-2 last week to [facility] for client  LCSW consulted today with RN Kathyrn Sheriff about client status and needs Client spoke of weakness, fall risk, dizziness and low energy [facility] Admission Counselor called LCSW today and said she had not received Fl-2 or H and P on client.  LCSW called office of PCP today and gave receptionist the return phone number of Admission Counselor at [facility] ([phone]) and asked that office representative to call that number to facilitate Fl-2 and H and P being sent to [facility] for review for client possible admission. Receptionist at PCP  office said she would send this information to MD today for his review (Dr. Posey Rea) LCSW called client again today and gave client this update information above to client via phone today. Client was appreciative of information update today from LCSW          SDOH assessments and interventions completed:  Yes  SDOH Interventions Today    Flowsheet Row Most Recent Value  SDOH Interventions   Depression Interventions/Treatment  Counseling  Physical Activity Interventions Other (Comments)  [mobility issues. at risk for falls]  Stress Interventions Provide Counseling        Care Coordination Interventions:  Yes, provided    Interventions Today    Flowsheet Row Most Recent Value  Chronic Disease   Chronic disease during today's visit Other  [spoke with client about client needs]  General Interventions   General Interventions Discussed/Reviewed General Interventions Discussed, Community Resources  Education Interventions   Education Provided Provided Education  Provided Verbal Education On [employer]  Mental Health Interventions   Mental Health Discussed/Reviewed Coping Strategies  Nutrition Interventions   Nutrition Discussed/Reviewed Nutrition Discussed  Pharmacy Interventions   Pharmacy Dicussed/Reviewed Pharmacy Topics Discussed  Safety Interventions   Safety Discussed/Reviewed Fall Risk       Follow up plan: LCSW to speak via phone with client as scheduled to assess client needs   Encounter Outcome:  Patient Visit Completed    [name]  MSW, LCSW [place]/Value  Based Care [facility] Licensed Clinical Social Worker Direct Dial:  [phone] Fax:  [phone] Website:  [email or web address]

## 2023-08-15 NOTE — Patient Instructions (Signed)
 Visit Information  Thank you for taking time to visit with me today. Please don't hesitate to contact me if I can be of assistance to you.   Following are the goals we discussed today:   Goals Addressed             This Visit's Progress    Patient feels that she may need ALF level of care soon       Interventions:  Spoke with client today about client needs and status Client spoke today via phone with RN Kathyrn Sheriff LCSW called [facility] today and asked to speak with admission counselor. Admission counselor was not available.  Receptionist asked LCSW to call back at 1:30 PM today to speak at that time with Admission counselor . LCSW returned call to [facility] this afternoon but did not reach Admission Counselor. LCSW requested that Admissions counselor call LCSW today , if available, to speak about status of admission request for client LCSW shared this information today via phone with client Client does feel that she needs another level of care. Client has appointment this week with PCP and wants to talk more with PCP about LOC concerns she has. Of note, PCP office did send client FL-2 last week to [facility] for client  LCSW consulted today with RN Kathyrn Sheriff about client status and needs Client spoke of weakness, fall risk, dizziness and low energy [facility] Admission Counselor called LCSW today and said she had not received Fl-2 or H and P on client.  LCSW called office of PCP today and gave receptionist the return phone number of Admission Counselor at [facility] ([phone]) and asked that office representative to call that number to facilitate Fl-2 and H and P being sent to [facility] for review for client possible admission. Receptionist at PCP office said she would send this information to MD today for his review (Dr. Posey Rea) LCSW called client again today and gave client this update information above to client via phone today. Client was  appreciative of information update today from LCSW         LCSW to speak via phone with client as scheduled to assess client needs  Please call the care guide team at [phone] if you need to cancel or reschedule your appointment.   If you are experiencing a Mental Health or Behavioral Health Crisis or need someone to talk to, please go to [facility] [address] [employer] [phone])   The patient verbalized understanding of instructions, educational materials, and care plan provided today and DECLINED offer to receive copy of patient instructions, educational materials, and care plan.   The patient has been provided with contact information for the care management team and has been advised to call with any health related questions or concerns.    [name]  MSW, LCSW [place]/Value Based Care Institute [facility] Licensed Clinical Social Worker Direct Dial:  [phone] Fax:  [phone] Website:  [email or web address]

## 2023-08-15 NOTE — Telephone Encounter (Signed)
 Copied from CRM [id number]. Topic: General - Other >> Aug 15, 2023  2:22 PM Aletta Edouard wrote: Reason for CRM: patient is needing a higher level of care she is needing a fl2 form filled out to [facility] they have yet to received the fl2 form [phone] admission counselor at [facility] patient is really weak and short of breath and needing this fl2 form done

## 2023-08-16 ENCOUNTER — Telehealth: Admitting: Internal Medicine

## 2023-08-16 NOTE — Telephone Encounter (Signed)
 Patient called and wants to know if the [facility] form was ever signed.  Please call patient at [phone]

## 2023-08-17 ENCOUNTER — Telehealth (INDEPENDENT_AMBULATORY_CARE_PROVIDER_SITE_OTHER): Admitting: Internal Medicine

## 2023-08-17 ENCOUNTER — Encounter: Payer: Self-pay | Admitting: Internal Medicine

## 2023-08-17 ENCOUNTER — Ambulatory Visit (INDEPENDENT_AMBULATORY_CARE_PROVIDER_SITE_OTHER): Payer: 59 | Admitting: Psychology

## 2023-08-17 ENCOUNTER — Other Ambulatory Visit: Payer: Self-pay

## 2023-08-17 ENCOUNTER — Other Ambulatory Visit (HOSPITAL_COMMUNITY): Payer: Self-pay

## 2023-08-17 DIAGNOSIS — M797 Fibromyalgia: Secondary | ICD-10-CM | POA: Diagnosis not present

## 2023-08-17 DIAGNOSIS — F332 Major depressive disorder, recurrent severe without psychotic features: Secondary | ICD-10-CM

## 2023-08-17 DIAGNOSIS — F4323 Adjustment disorder with mixed anxiety and depressed mood: Secondary | ICD-10-CM | POA: Diagnosis not present

## 2023-08-17 DIAGNOSIS — F09 Unspecified mental disorder due to known physiological condition: Secondary | ICD-10-CM | POA: Diagnosis not present

## 2023-08-17 DIAGNOSIS — R5381 Other malaise: Secondary | ICD-10-CM

## 2023-08-17 MED ORDER — TRAMADOL HCL 50 MG PO TABS
50.0000 mg | ORAL_TABLET | Freq: Four times a day (QID) | ORAL | 2 refills | Status: AC | PRN
  Filled 2023-08-17: qty 120, 30d supply, fill #0
  Filled 2023-11-17: qty 120, 30d supply, fill #1

## 2023-08-17 MED ORDER — CIPROFLOXACIN HCL 250 MG PO TABS
250.0000 mg | ORAL_TABLET | Freq: Two times a day (BID) | ORAL | 2 refills | Status: DC
  Filled 2023-08-17: qty 14, 7d supply, fill #0
  Filled 2023-11-15: qty 14, 7d supply, fill #1
  Filled 2023-11-17 – 2024-01-05 (×2): qty 14, 7d supply, fill #2

## 2023-08-17 NOTE — Assessment & Plan Note (Signed)
 Mild Planning to move to [facility]

## 2023-08-17 NOTE — Assessment & Plan Note (Signed)
 Stable Planning to move to [facility]

## 2023-08-17 NOTE — Assessment & Plan Note (Signed)
 Worse Tramadol and Tylenol prn Planning to move to [facility]

## 2023-08-17 NOTE — Assessment & Plan Note (Signed)
 Better on a low dose Ritalin 10 mg in AM  Potential benefits of a long term amphetamines  use as well as potential risks  and complications were explained to the patient and were aknowledged. Tramadol for pain

## 2023-08-17 NOTE — Progress Notes (Signed)
 Virtual Visit via Video Note  I connected with [name] on 08/17/23 at 10:40 AM EDT by a video enabled telemedicine application and verified that I am speaking with the correct person using two identifiers.   I discussed the limitations of evaluation and management by telemedicine and the availability of in person appointments. The patient expressed understanding and agreed to proceed.  I was located at our [facility] office. The patient was at home. There was no one else present in the visit.  Chief Complaint  Patient presents with  . Fatigue    Weakness  Chronic Fatigue  Fibromyalgia  Difficulty with daily living activities such as bathing, cooking and cleaning      History of Present Illness:  C/o more pain all over, weakness, dizziness. Taking Tylenol and Tramadol 2 at hs only   Review of Systems  Constitutional:  Positive for malaise/fatigue. Negative for fever.  HENT:  Negative for hearing loss.   Cardiovascular:  Negative for leg swelling.  Gastrointestinal:  Negative for diarrhea.  Genitourinary:  Negative for dysuria and urgency.  Musculoskeletal:  Positive for back pain, joint pain and myalgias.  Neurological:  Positive for weakness. Negative for tremors and loss of consciousness.  Psychiatric/Behavioral:  Positive for depression and memory loss. Negative for hallucinations and suicidal ideas. The patient is nervous/anxious and has insomnia.      Observations/Objective: The patient appears to be in no acute distress  Assessment and Plan:  Problem List Items Addressed This Visit     Major depressive disorder, recurrent, severe without psychotic features (HCC)   Stable Planning to move to [facility]      Fibromyalgia   Worse Tramadol and Tylenol prn Planning to move to [place] [employer]      Relevant Medications   traMADol (ULTRAM) 50 MG tablet   Malaise and fatigue - Primary   Better on a low dose Ritalin 10 mg in AM  Potential benefits of  a long term amphetamines  use as well as potential risks  and complications were explained to the patient and were aknowledged. Tramadol for pain       Mild cognitive disorder   Mild Planning to move to [facility] ordered this encounter  Medications  . traMADol (ULTRAM) 50 MG tablet    Sig: Take 1 tablet (50 mg total) by mouth every 6 (six) hours as needed for severe pain (pain score 7-10).    Dispense:  120 tablet    Refill:  2  . ciprofloxacin (CIPRO) 250 MG tablet    Sig: Take 1 tablet (250 mg total) by mouth 2 (two) times daily.    Dispense:  14 tablet    Refill:  2     Follow Up Instructions:    I discussed the assessment and treatment plan with the patient. The patient was provided an opportunity to ask questions and all were answered. The patient agreed with the plan and demonstrated an understanding of the instructions.   The patient was advised to call back or seek an in-person evaluation if the symptoms worsen or if the condition fails to improve as anticipated.  I provided face-to-face time during this encounter. We were at different locations.   Sonda Primes, MD

## 2023-08-17 NOTE — Telephone Encounter (Signed)
 Reason for CRM: Patient states [facility] has not received FL2 Forms and she has a new fax number that it can be faxed to.  Fax number: [phone] ATTN: Star

## 2023-08-17 NOTE — Progress Notes (Signed)
 [name] is a 73 y.o. 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  Symptoms A diagnosis of an acute, serious illness that is life-threatening. (Status: maintained) -- No Description Entered  Diminished interest in or enjoyment of activities. (Status: maintained) -- No Description Entered  Lack of energy. (Status: maintained) -- No Description Entered  Sad affect, social withdrawal, anxiety, loss of interest in activities, and low energy. (Status: maintained) -- No Description Entered  Medication Status compliance  Safety unspecified If Suicidal or Homicidal State Action Taken: unspecified  Current Risk:  Medications Buspar (Dosage: 30mg  2X/day)  New Medication (Dosage: 250mg  2x/day)  New Medication (Dosage: 250mg  2x/day)  Prozac (Dosage: 40mg  daily)  Remeron (Dosage: 30mg  nightly)  xanax (Dosage: .5mg  2-3X/day)  Objectives Related Problem: Recognize, accept, and cope with feelings of depression. Description: Identify and replace thoughts and beliefs that support depression. Target Date: 2024-04-28 Frequency: Daily Modality: individual Progress: 80%  Related Problem: Recognize, accept, and cope with feelings of depression. Description: Verbalize an understanding of healthy and unhealthy emotions with the intent of increasing the use of healthy emotions to guide actions. Target Date: 2024-04-28 Frequency: Daily Modality: individual Progress: 85%  Related Problem: Recognize, accept, and cope with feelings of depression. Description: Increasingly verbalize hopeful and positive statements regarding self, others, and the future. Target Date: 2022-04-28 Frequency: Daily Modality: individual Progress: 100%  Related Problem: Reduce fear, anxiety, and worry associated with the medical condition. Description: Share with significant others efforts to adapt  successfully to the medical condition/diagnosis. Target Date: 2024-04-28 Frequency: Daily Modality: individual Progress: 85%  Related Problem: Reduce fear, anxiety, and worry associated with the medical condition. Description: Engage in social, productive, and recreational activities that are possible in spite of medical condition. Target Date: 2024-04-28 Frequency: Daily Modality: individual Progress: 90%  Related Problem: Reduce fear, anxiety, and worry associated with the medical condition. Description: Identify the coping skills and sources of emotional support that have been beneficial in the past. Target Date: 2024-04-28 Frequency: Daily Modality: individual Progress: 90%  Client Response full compliance  Service Location Location, [address] [place], [place] [address]  Service Code cpt [phone]  Related past to present  Facilitate problem solving  Identify/label emotions  Validate/empathize  Self care activities  Lifestyle change (exercise, nutrition)  Assess/facilitate readiness to change  Relaxation training  Rationally challenge thoughts or beliefs/cognitive restructuring  Comments  Dx: F43.21  Meds: Prozac, [name], patient is struggling to rebuild her life and adjust to her new circumstances. She suffers a negative self-concept and has little confidence in her judgement. She is seeking to reduce symptoms of anxiety and self-doubt and to re-define satisfa ction moving forward. Therapy will focus on cognitive restructuring to redefine her experiences and focus on those satisfying aspects of her life. Will target this goal completion for April1, 2021 (completed). She is also wanting to utilize counseling to adjust and accept her latest diagnosis of cancer. She wants assistance to better manage this news and remain positive throughout treatment. Goal date is 12-22. Patient has made progress, but her medical  condition is variable and she requests additional treatment to manage moods throughout her medical ordeal. In addition, she is trying to manage feelings associated with her older son's mental health struggles. Revised goal date is 12-25.  Patient agrees to a video session and is aware of the limitations of this platform. She is at home and I am in home office.   [name]: She says she had an appointment with Dr. Elson Areas this morning and he has completed the referral for assisted living. Her first choice is to go to [facility]. 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 She is selling her car in recognition that she can no longer drive and be independent. Her acceptance has been a significant relief. We discussed potential struggles in making the transition of housing.                                                                                                                                                                                                                                                                                      Tawnie Ehresman LEWIS Time: 3:15p-4:00p 45 minutes

## 2023-08-18 ENCOUNTER — Telehealth: Payer: Self-pay | Admitting: *Deleted

## 2023-08-18 NOTE — Progress Notes (Signed)
 Care Guide Pharmacy Note  08/18/2023 Name: [name] MRN: [id number] DOB: January 20, 1951  Referred By: Tresa Garter, MD Reason for referral: Care Coordination (Outreach to schedule referral with pharmacist )   [name] is a 73 y.o. year old female who is a primary care patient of Plotnikov, Georgina Quint, MD.  [name] was referred to the pharmacist for assistance related to:  med management   An unsuccessful telephone outreach was attempted today to contact the patient who was referred to the pharmacy team for assistance with medication management. Additional attempts will be made to contact the patient.  Burman Nieves, CMA [place]  [facility], [facility] Guide Direct Dial: [phone]  Fax: [phone] Website: [email or web address]

## 2023-08-22 ENCOUNTER — Ambulatory Visit (INDEPENDENT_AMBULATORY_CARE_PROVIDER_SITE_OTHER): Payer: 59 | Admitting: Psychology

## 2023-08-22 DIAGNOSIS — F4323 Adjustment disorder with mixed anxiety and depressed mood: Secondary | ICD-10-CM

## 2023-08-22 NOTE — Progress Notes (Signed)
 [name] is a 73 y.o. 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  Symptoms A diagnosis of an acute, serious illness that is life-threatening. (Status: maintained) -- No Description Entered  Diminished interest in or enjoyment of activities. (Status: maintained) -- No Description Entered  Lack of energy. (Status: maintained) -- No Description Entered  Sad affect, social withdrawal, anxiety, loss of interest in activities, and low energy. (Status: maintained) -- No Description Entered  Medication Status compliance  Safety unspecified If Suicidal or Homicidal State Action Taken: unspecified  Current Risk:  Medications Buspar (Dosage: 30mg  2X/day)  New Medication (Dosage: 250mg  2x/day)  New Medication (Dosage: 250mg  2x/day)  Prozac (Dosage: 40mg  daily)  Remeron (Dosage: 30mg  nightly)  xanax (Dosage: .5mg  2-3X/day)  Objectives Related Problem: Recognize, accept, and cope with feelings of depression. Description: Identify and replace thoughts and beliefs that support depression. Target Date: 2024-04-28 Frequency: Daily Modality: individual Progress: 80%  Related Problem: Recognize, accept, and cope with feelings of depression. Description: Verbalize an understanding of healthy and unhealthy emotions with the intent of increasing the use of healthy emotions to guide actions. Target Date: 2024-04-28 Frequency: Daily Modality: individual Progress: 85%  Related Problem: Recognize, accept, and cope with feelings of depression. Description: Increasingly verbalize hopeful and positive statements regarding self, others, and the future. Target Date: 2022-04-28 Frequency: Daily Modality: individual Progress: 100%  Related Problem: Reduce fear, anxiety, and worry associated with the medical condition. Description: Share with  significant others efforts to adapt successfully to the medical condition/diagnosis. Target Date: 2024-04-28 Frequency: Daily Modality: individual Progress: 85%  Related Problem: Reduce fear, anxiety, and worry associated with the medical condition. Description: Engage in social, productive, and recreational activities that are possible in spite of medical condition. Target Date: 2024-04-28 Frequency: Daily Modality: individual Progress: 90%  Related Problem: Reduce fear, anxiety, and worry associated with the medical condition. Description: Identify the coping skills and sources of emotional support that have been beneficial in the past. Target Date: 2024-04-28 Frequency: Daily Modality: individual Progress: 90%  Client Response full compliance  Service Location Location, [address] [place], [place] [address]  Service Code cpt [phone]  Related past to present  Facilitate problem solving  Identify/label emotions  Validate/empathize  Self care activities  Lifestyle change (exercise, nutrition)  Assess/facilitate readiness to change  Relaxation training  Rationally challenge thoughts or beliefs/cognitive restructuring  Comments  Dx: F43.21  Meds: Prozac, [name], patient is struggling to rebuild her life and adjust to her new circumstances. She suffers a negative self-concept and has little confidence in her judgement. She is seeking to reduce symptoms of anxiety and self-doubt and to re-define satisfa ction moving forward. Therapy will focus on cognitive restructuring to redefine her experiences and focus on those satisfying aspects of her life. Will target this goal completion for April1, 2021 (completed). She is also wanting to utilize counseling to adjust and accept her latest diagnosis of cancer. She wants assistance to better manage this news and remain positive throughout treatment. Goal date is 12-22.  Patient has made progress, but her medical condition is variable and she requests additional treatment to manage moods throughout her medical ordeal. In addition, she is trying to  manage feelings associated with her older son's mental health struggles. Revised goal date is 12-25.   Patient agrees to a video session and is aware of the limitations of this platform. She is at home and I am in home office.   [name]: She states she is overwhelmed with the concept of moving to a retirement home. She loves her home and grieves for having to leave. She also grieves the loss of some of her independence. She is saying that she feels done with medical procedures and does not want more surgeries or cancer treatments. We talked about having appropriate expectations.                                                                                                                                                                                                                                                                                          [name] Time: 4:15p-5:00p 45 minutes

## 2023-08-22 NOTE — Progress Notes (Signed)
 Care Guide Pharmacy Note  08/22/2023 Name: [name] MRN: [id number] DOB: 11-14-1950  Referred By: Tresa Garter, MD Reason for referral: Care Coordination (Outreach to schedule referral with pharmacist )   [name] is a 73 y.o. year old female who is a primary care patient of Plotnikov, Georgina Quint, MD.  [name] was referred to the pharmacist for assistance related to:  med management   Successful contact was made with the patient to discuss pharmacy services including being ready for the pharmacist to call at least 5 minutes before the scheduled appointment time and to have medication bottles and any blood pressure readings ready for review. The patient agreed to meet with the pharmacist via telephone visit on 09/12/2023  Burman Nieves, CMA [place]  [facility] Guide Direct Dial: [phone]  Fax: [phone] Website: [email or web address]

## 2023-08-23 ENCOUNTER — Other Ambulatory Visit: Payer: Self-pay | Admitting: Licensed Clinical Social Worker
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
## 2023-08-23 NOTE — Patient Instructions (Signed)
 Visit Information  Thank you for taking time to visit with me today. Please don't hesitate to contact me if I can be of assistance to you before our next scheduled appointment.  Client has LCSW name and phone number. Client has been encouraged to call LCSW as needed for SW support at [phone]  Goals Discussed:    Problems:  Client is concerned about memory issues             Client feels that she is at risk for falls and needs more care assistance on daily basis             Client needs transport support.               Client has been communicating with [facility] regarding possible admission to that facility . Her mother already resides at [facility]              Client needs to apply for ALF Medicaid support              Client has pain issues  CSW Clinical Goal(s):   Over the next 30 days  the Patient will attend all scheduled medical appointments as evidenced by patient report and care team review of appointment completion in EMR: .            Over next 30 days client will communicate with DSS Medicaid Caseworker to start Medicaid application for client for ALF LOC with Medicaid AEB client progress in this Medicaid application  Interventions:            LCSW called admissions counselor today for [facility] and talked with admissions counselor about client needs. Admissions Counselor is to contact PCP office of client to request current FL-2 for client for review by admissions counselor  LCSW provided client with phone number for DSS [facility] [phone]) Client wrote down this DSS phone number             LCSW encouraged Corrie Dandy to call DSS Adult Medicaid Caseworker and talk with Caseworker about applying for ALF LOC Medicaid for client             Antonisha said she would call DSS Medicaid Caseworker             LCSW also informed Etoile of some of the documents she could collect prior to call with DSS Caseworker.  These documents will help her with her  application for Medicaid               Provided counseling support              Discussed medication procurement of client. Discussed pain issues of client              Encouraged client to call LCSW as needed for SW support at [phone]  Patient Goals/Self-Care Activities:  Call Department of Social Services [phone] to apply for ALF level of Medicaid.             Attend scheduled medical appointments              Take medications as prescribed               Allow time for rest and to eat meals as scheduled               Communicate with LCSW as needed for SW support  Plan:   LCSW has given client LCSW name and  phone number and LCSW has encouraged client to call LCSW as needed for SW support at [phone]  Please call the care guide team at [phone] if you need to cancel, schedule, or reschedule an appointment.   Please go to [facility] [address] [place] [phone]) if you are experiencing a Mental Health or Behavioral Health Crisis or need someone to talk to.  Patient has received information and instructions related to program support. She has understood information and DECLINED further information at present  Patient has been given information on how to contact Care Team for support. LCSW has provided client with LCSW name and phone number for support [phone])   [name]  MSW, LCSW [place]/[facility] Licensed Clinical Social Worker Direct Dial:  [phone] Fax:  [phone] Website:  [email or web address]

## 2023-08-25 ENCOUNTER — Telehealth: Payer: Self-pay | Admitting: Internal Medicine

## 2023-08-25 ENCOUNTER — Ambulatory Visit: Payer: Self-pay

## 2023-08-25 NOTE — Telephone Encounter (Signed)
 Copy of forms available for pick up at front desk
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
## 2023-08-25 NOTE — Telephone Encounter (Signed)
 Patient is requesting an FL2 form for [facility]

## 2023-08-25 NOTE — Telephone Encounter (Signed)
 Being addressed in more recent telephone note

## 2023-08-25 NOTE — Patient Instructions (Signed)
 Visit Information  Thank you for taking time to visit with me today. Please don't hesitate to contact me if I can be of assistance to you before our next scheduled appointment.  The Care guide will call to schedule your next appointment. Please call the care guide team at [phone] if you need to cancel or reschedule your appointment.   Following is a copy of your care plan:   Goals Addressed             This Visit's Progress    COMPLETED: Assist with Health management       Interventions Today    Flowsheet Row Most Recent Value  Chronic Disease   Chronic disease during today's visit Other  [fibromyalgia]  General Interventions   General Interventions Discussed/Reviewed General Interventions Reviewed, Communication with  Doctor Visits Discussed/Reviewed Doctor Visits Reviewed, PCP  PCP/Specialist Visits Compliance with follow-up visit  Communication with PCP/Specialists, Social Work  [updated LCSW regarding patient status awaiting information regarding LOC needs. update PCP regarding home health therapy/bath aid referral.]  Level of Care Assisted Living  Exercise Interventions   Exercise Discussed/Reviewed Physical Activity  Education Interventions   Education Provided Provided Education  Provided Verbal Education On Nutrition, Medication, When to see the doctor, [employer], Mental Health/Coping with Illness  [discussed benefits of life alert system-referral to care guide, encouraged patient to contact [employer] [place] re: organization that can assist her with cleaning out her home. Encouraged patient to also follow up with insurance company.]  Mental Health Interventions   Mental Health Discussed/Reviewed Mental Health Reviewed  Jorje Guild listening. encouraged patient to continue to follow up with therapy sessions as scheduled]  Nutrition Interventions   Nutrition Discussed/Reviewed Nutrition Reviewed  Pharmacy Interventions   Pharmacy Dicussed/Reviewed  Pharmacy Topics Reviewed  Safety Interventions   Safety Discussed/Reviewed Fall Risk, Home Safety  Home Safety Refer for community resources, Contact provider for referral to PT/OT, Assistive Devices           VBCI RN Care Plan       Problems:  Chronic Disease Management support and education needs related to Fibromyalgia Lacks caregiver support. Level of care needs  Goal: Over the next 30 days the Patient will  verbalize transition to higher level of care  Interventions:   Evaluation of current treatment plan related to  fibromyalgia and level of care needs , Level of care concerns self-management and patient's adherence to plan as established by provider. Discussed plans with patient for ongoing care management follow up and provided patient with direct contact information for care management team Advised patient to continue to work with LCSW, [facility] place staff and PCP regarding transition to [facility]/higher level of care Collaborated with LCSW and provider as needed regarding patient status and care needs Social Work referral for previous referral re: assistance with level of care needs Pharmacy referral for previously place to review medications for possible interactions side effects. Active Listening and support provided  Patient Self-Care Activities:  Attend all scheduled provider appointments Take medications as prescribed   Work with the social worker to address care coordination needs and will continue to work with the clinical team to address health care and disease management related needs Work with the pharmacist to address medication management needs and will continue to work with the clinical team to address health care and disease management related needs Continue fall prevention strategies  Plan:  RNCM will continue to follow  Please call the Suicide and Crisis Lifeline: 988 call the [place] National Suicide Prevention Lifeline: (684)314-7688  or TTY: (330) 444-0049 TTY (418) 131-9267) to talk to a trained counselor if you are experiencing a Mental Health or Behavioral Health Crisis or need someone to talk to.  The patient verbalized understanding of instructions, educational materials, and care plan provided today and DECLINED offer to receive copy of patient instructions, educational materials, and care plan.   Kathyrn Sheriff, RN, MSN, BSN, CCM [place]  [facility], Population Health Case Manager Phone: [phone]

## 2023-08-25 NOTE — Telephone Encounter (Signed)
 Patient paperwork located from 07/2023 which looks to have faxed to [phone].  [facility], [place] from previous message [phone], left detailed message on confidential VM that we need to verify fax number. On 4/2 another message indicates updated fax number of [phone] Attn: Star.

## 2023-08-25 NOTE — Telephone Encounter (Signed)
 Copied from CRM [id number]. Topic: General - Other >> Aug 25, 2023 10:44 AM Truddie Crumble wrote: Reason for CRM: patient called stating she need to pick up an FL2 form because the [facility] has not received it yet. Patient stated her friend Tim Lair) is coming to pick it up for her

## 2023-08-25 NOTE — Telephone Encounter (Signed)
 Called and spoke with a representative at [facility]. They informed me that their social work team is who handles these forms. They will be reaching back out to me or just send over new FL2 forms for me to fill out. No luck when searching for the original

## 2023-08-26 ENCOUNTER — Other Ambulatory Visit (HOSPITAL_COMMUNITY): Payer: Self-pay

## 2023-08-29 ENCOUNTER — Other Ambulatory Visit: Payer: Self-pay | Admitting: Cardiology

## 2023-08-30 ENCOUNTER — Ambulatory Visit: Admitting: Psychology

## 2023-08-30 ENCOUNTER — Other Ambulatory Visit (HOSPITAL_COMMUNITY): Payer: Self-pay

## 2023-08-30 MED ORDER — ROSUVASTATIN CALCIUM 10 MG PO TABS
10.0000 mg | ORAL_TABLET | Freq: Every day | ORAL | 3 refills | Status: AC
  Filled 2023-08-31 – 2023-09-01 (×2): qty 90, 90d supply, fill #0
  Filled 2023-09-06 – 2023-09-13 (×3): qty 30, 30d supply, fill #0
  Filled 2023-10-03 – 2023-10-06 (×2): qty 30, 30d supply, fill #1
  Filled 2023-10-26 – 2023-11-15 (×3): qty 30, 30d supply, fill #2
  Filled 2023-11-17 – 2023-12-08 (×3): qty 30, 30d supply, fill #3
  Filled 2024-01-05 – 2024-01-15 (×3): qty 30, 30d supply, fill #4

## 2023-08-31 ENCOUNTER — Other Ambulatory Visit: Payer: Self-pay | Admitting: Internal Medicine

## 2023-08-31 ENCOUNTER — Ambulatory Visit: Payer: 59 | Admitting: Psychology

## 2023-08-31 ENCOUNTER — Other Ambulatory Visit: Payer: Self-pay

## 2023-08-31 MED ORDER — MIRTAZAPINE 30 MG PO TABS
30.0000 mg | ORAL_TABLET | Freq: Every day | ORAL | 5 refills | Status: DC
  Filled 2023-08-31 – 2023-09-13 (×4): qty 30, 30d supply, fill #0
  Filled 2023-09-16 – 2023-10-06 (×3): qty 30, 30d supply, fill #1
  Filled 2023-10-26 – 2023-11-15 (×3): qty 30, 30d supply, fill #2
  Filled 2023-11-17 – 2023-12-08 (×3): qty 30, 30d supply, fill #3
  Filled 2024-01-05 – 2024-01-15 (×3): qty 30, 30d supply, fill #4

## 2023-09-01 ENCOUNTER — Other Ambulatory Visit (HOSPITAL_COMMUNITY): Payer: Self-pay

## 2023-09-01 ENCOUNTER — Other Ambulatory Visit: Payer: Self-pay

## 2023-09-02 ENCOUNTER — Other Ambulatory Visit: Payer: Self-pay

## 2023-09-02 ENCOUNTER — Other Ambulatory Visit (HOSPITAL_COMMUNITY): Payer: Self-pay

## 2023-09-05 ENCOUNTER — Other Ambulatory Visit: Payer: Self-pay

## 2023-09-05 ENCOUNTER — Ambulatory Visit (INDEPENDENT_AMBULATORY_CARE_PROVIDER_SITE_OTHER): Payer: 59 | Admitting: Psychology

## 2023-09-05 DIAGNOSIS — F4323 Adjustment disorder with mixed anxiety and depressed mood: Secondary | ICD-10-CM | POA: Diagnosis not present
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
## 2023-09-06 ENCOUNTER — Other Ambulatory Visit (HOSPITAL_COMMUNITY): Payer: Self-pay

## 2023-09-06 ENCOUNTER — Other Ambulatory Visit: Payer: Self-pay

## 2023-09-06 ENCOUNTER — Ambulatory Visit: Payer: Self-pay | Admitting: Licensed Clinical Social Worker

## 2023-09-06 ENCOUNTER — Other Ambulatory Visit (HOSPITAL_COMMUNITY): Payer: Self-pay | Admitting: Psychiatry

## 2023-09-06 NOTE — Telephone Encounter (Signed)
 Copied from CRM [id number]. Topic: General - Other >> Sep 06, 2023 10:18 AM Adonis Hoot wrote: Reason for CRM: Patient called in stating that the department of Social services is needing a copy of the FL2 that was sent over to [facility] as well.  Social Worker Arva Lathe [phone] Email address: [email or web address]

## 2023-09-06 NOTE — Patient Instructions (Signed)
 Visit Information  Thank you for taking time to visit with me today. Please don't hesitate to contact me if I can be of assistance to you before our next scheduled appointment.  Client to call LCSW as needed for SW support at [phone]  Please call the care guide team at [phone] if you need to cancel or reschedule your appointment.   Following is a copy of your care plan:   Goals Addressed             This Visit's Progress    VBCI Social Work Care Plan       Problems:   Needs time to complete ADLs            Transport needs             Food preparation issues             Fall risk             Cognitive issues. (She is concerned about memory issues)  CSW Clinical Goal(s):   Over the next 30 days   the Patient will attend all scheduled medical appointments as evidenced by patient report and care team review of appointment completion in electronic MEDICAL RECORD NUMBER  .               Over next 30 days, patient will communicate with SNF facilities of interest to determine possible placement options for client AEB patient report  Interventions:  Spoke with client about  [facility] facility, and about 3 other SNF facilities in the area              Discussed pain issues; discussed sleeping issues of client             Discussed FL-2 completion by PCP for client. Client has been applying for Medicaid with DSS in [place], [place]. Client to request PCP to send FL-2 to DSS Caseworker for client             Discussed appetite of client. Client said she has decreased appetite             Discussed family support for client              Client said she is on waiting list at [facility]              Spoke of Dual Complete plan of client              Client spoke of decreased energy. She uses walker to walk She gets dizzy occasionally               Discussed medication procurement               Discussed client challenges with ADLs                 Patient Goals/Self-Care  Activities:  Client to attend scheduled medical appointments             Client to take medications as prescribed              Client to allow time for ADLs completion             Client to communicate with PCP office as needed to discussed medical needs of client             Client to use coping skills as needed to manage anxiety issues of client  Plan:   Client has LCSW name and phone and can call LCSW as needed for SW support        Please go to [facility] [address], [place] [phone]) if you are experiencing a Mental Health or Behavioral Health Crisis or need someone to talk to.  The patient verbalized understanding of instructions, educational materials, and care plan provided today and DECLINED offer to receive copy of patient instructions, educational materials, and care plan.   Patient was provided contact information to contact Care Team for support. LCSW gave client LCSW name and phone number for LCSW support   [name]  MSW, LCSW [place] Licensed Clinical Social Worker Direct Dial:  [phone] Fax:  [phone] Website:  [email or web address]

## 2023-09-06 NOTE — Telephone Encounter (Signed)
 Called and left message for call back. Detailed message stating that we would have to send this over via fax or that the form would need to be picked up. We would not be able to provide this form through email
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
## 2023-09-07 ENCOUNTER — Other Ambulatory Visit: Payer: Self-pay | Admitting: Internal Medicine

## 2023-09-07 ENCOUNTER — Other Ambulatory Visit: Payer: Self-pay

## 2023-09-08 ENCOUNTER — Other Ambulatory Visit: Payer: Self-pay

## 2023-09-08 ENCOUNTER — Telehealth: Payer: Self-pay | Admitting: *Deleted

## 2023-09-08 NOTE — Telephone Encounter (Signed)
 Spoke with [name] who called the office to reschedule her missed follow up appt. With Dr. Orvil Bland. Pt was given an appointment for Thursday, June 12 th at 3:30 with arrival time of 3:15 for check in. Pt agreed to date and time and had no further concerns.

## 2023-09-08 NOTE — Telephone Encounter (Signed)
 Spoke with [name] and have since faxed out a copy of the FL2 form for their records  FAX: [phone]

## 2023-09-09 ENCOUNTER — Other Ambulatory Visit: Payer: Self-pay

## 2023-09-12 ENCOUNTER — Other Ambulatory Visit: Admitting: Pharmacist

## 2023-09-12 DIAGNOSIS — R5381 Other malaise: Secondary | ICD-10-CM

## 2023-09-12 NOTE — Patient Instructions (Signed)
 It was a pleasure speaking with you today!  Trial reducing mirtazapine  to 1/2 tablet every night. If you notice worsened sleep quality, you can increase back to 1 tablet nightly.  Feel free to call with any questions or concerns!  Rainelle Bur, PharmD, BCPS, CPP Clinical Pharmacist Practitioner [facility] [phone]

## 2023-09-12 NOTE — Progress Notes (Cosign Needed)
 09/12/2023 Name: [name] MRN: [id number] DOB: [birth year]  Chief Complaint  Patient presents with   Medication Review    [name] is a 73 y.o. year old female who presented for a telephone visit.   They were referred to the pharmacist by their Case Management Team  for assistance in managing  medication review .   Subjective:  Care Team: Primary Care Provider: Genia Kettering, MD ; Next Scheduled Visit: 08/02/2024   Medication Access/Adherence  Current Pharmacy:  Maryan Smalling - [facility] [address] [place] [address] Phone: [phone] Fax: [phone]  [id number] - [place], [place] - [address] [place] [address] Phone: [phone] Fax: [phone]   Patient reports affordability concerns with their medications: No  Patient reports access/transportation concerns to their pharmacy: No  Patient reports adherence concerns with their medications:  No    Pt notes she wakes up with nausea every morning, which gets better after she eats/drinks. 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 causing a lot of mobility and inflammation issues She is currently applying to go to [facility] because she feels she needs more care than what she can do living alone at home   Objective:  Lab Results  Component Value Date   HGBA1C 5.8 03/01/2022    Lab Results  Component Value Date   CREATININE 1.26 (H) 07/28/2023   BUN 29 (H) 07/28/2023   NA 141 07/28/2023   K 4.0 07/28/2023   CL 106 07/28/2023   CO2 27 07/28/2023    Lab Results  Component Value Date   CHOL 277 (H) 09/23/2022   HDL 51  09/23/2022   LDLCALC 174 (H) 09/23/2022   LDLDIRECT 160.0 10/18/2018   TRIG 276 (H) 09/23/2022   CHOLHDL 5.4 (H) 09/23/2022    Medications Reviewed Today     Reviewed by Dion Frankel, RPH (Pharmacist) on 09/12/23 at 1524  Med List Status: <None>   Medication Order Taking? Sig Documenting Provider Last Dose Status Informant  ALPRAZolam  (XANAX ) 1 MG tablet [id number] Yes Take 1 tablet (1 mg total) by mouth 3 (three) times daily as needed for anxiety. Plotnikov, Aleksei V, MD Taking Active   ascorbic acid  (VITAMIN C ) 500 MG tablet [id number] Yes Take 1 tablet (500 mg total) by mouth daily. Plotnikov, Aleksei V, MD Taking Active Self, Pharmacy Records  aspirin  EC 81 MG tablet [id number] Yes Take 1 tablet (81 mg total) by mouth daily. Swallow whole. Plotnikov, Aleksei V, MD Taking Active   buPROPion  (WELLBUTRIN  XL) 150 MG 24 hr tablet [id number] Yes Take 1 tablet (150 mg total) by mouth in the morning. Alba Ally, MD Taking Active   busPIRone  (BUSPAR ) 30 MG tablet [id number] Yes Take 1 tablet (30 mg total) by mouth 2 (two) times daily. Plotnikov, Aleksei V, MD Taking Active   carvedilol  (COREG ) 12.5 MG tablet [id number] Yes Take 1 tablet (12.5 mg total) by mouth 2 (two) times daily with a meal. Tobb, Kardie, DO Taking Active   Cholecalciferol  (D3 5000) 125 MCG (5000 UT) capsule [id number] Yes Take 1 capsule (5,000 Units total) by mouth daily. Plotnikov, Aleksei V, MD Taking Active  Self, Pharmacy Records  ciprofloxacin  (CIPRO ) 250 MG tablet [id number]  Take 1 tablet (250 mg total) by mouth 2 (two) times daily.  Patient not taking: Reported on 08/25/2023   Plotnikov, Aleksei V, MD  Active   cloNIDine  (CATAPRES ) 0.2 MG tablet [id number] Yes Take 1 tablet (0.2 mg total) by mouth at bedtime. Tobb, Kardie, DO Taking Active   colesevelam  (WELCHOL ) 625 MG tablet [id number] Yes Take 2 tablets (1,250 mg total) by mouth 2 (two) times daily with a meal. Plotnikov, Oakley Bellman, MD Taking Active   cyanocobalamin   (VITAMIN B12) 1000 MCG tablet [id number] Yes Take 1 tablet (1,000 mcg total) by mouth daily. Take 1,000 mcg by mouth daily. Plotnikov, Aleksei V, MD Taking Active Self, Pharmacy Records  D-Mannose 500 MG CAPS [id number] Yes Take 500 mg by mouth 2 (two) times daily. WITH CRANBERRY & DANDELION EXTRACT Plotnikov, Oakley Bellman, MD Taking Active Self, Pharmacy Records  esomeprazole  (NEXIUM ) 40 MG capsule [id number] Yes Take 1 capsule (40 mg total) by mouth every morning. Plotnikov, Aleksei V, MD Taking Active    Patient not taking:   Discontinued 11/05/19 0627 FLUoxetine  (PROZAC ) 40 MG capsule [id number] Yes Take 1 capsule (40 mg total) by mouth in the morning. Alba Ally, MD Taking Active   hydrALAZINE  (APRESOLINE ) 10 MG tablet [id number] Yes Take 1 tablet (10 mg total) by mouth in the morning, at noon, and at bedtime. MAY TAKE EXTRA 10MG  FOR SBP>160 Cleaver, Chet Cota, NP Taking Active   ipratropium-albuterol  (DUONEB) 0.5-2.5 (3) MG/3ML SOLN [id number]  Take 3 mLs by nebulization every 6 (six) hours as needed (Asthma). Plotnikov, Aleksei V, MD  Active   iron  polysaccharides (POLY-IRON  150) 150 MG capsule [id number] Yes Take 1 capsule (150 mg total) by mouth daily.  Patient taking differently: Take 150 mg by mouth 2 (two) times daily.   Plotnikov, Aleksei V, MD Taking Active Self, Pharmacy Records  Lactobacillus (ACIDOPHILUS) CAPS capsule [id number] Yes Take 1 capsule by mouth daily. Plotnikov, Aleksei V, MD Taking Active Self, Pharmacy Records  levothyroxine  (SYNTHROID ) 100 MCG tablet [id number] Yes Take 1 tablet (100 mcg total) by mouth daily before breakfast. Plotnikov, Aleksei V, MD Taking Active Self, Pharmacy Records  loperamide  (IMODIUM  A-D) 2 MG tablet [id number]  Take 1 tablet (2 mg total) by mouth 4 (four) times daily as needed for diarrhea or loose stools. Plotnikov, Aleksei V, MD  Active   Metamucil Fiber 2 g CHEW [id number] Yes Chew 2 tablets by mouth in the morning and at bedtime. Plotnikov, Aleksei V, MD  Taking Active Self, Pharmacy Records  mirtazapine  (REMERON ) 30 MG tablet [id number] Yes Take 1 tablet (30 mg total) by mouth at bedtime. Webb, Padonda B, FNP Taking Active   omega-3 acid ethyl esters (LOVAZA ) 1 g capsule [id number] Yes Take 2 capsules (2 g total) by mouth 2 (two) times daily. Plotnikov, Aleksei V, MD Taking Active Self, Pharmacy Records  ondansetron  (ZOFRAN ) 4 MG tablet [id number]  Take 1 tablet (4 mg total) by mouth every 8 (eight) hours as needed for nausea or vomiting. Timmie Footman  Active Self, Pharmacy Records  OVER THE COUNTER MEDICATION [id number]  Take 1 capsule by mouth daily. 11 MUSHROOM BLEND [provider]  Active Self, Pharmacy Records  Polyethyl Glycol-Propyl Glycol (SYSTANE) 0.4-0.3 % SOLN [id number]  Place 1 drop into both eyes 3 (three) times daily as needed (dry eyes). Place 1 drop into both eyes 3 (three) times daily as needed (dry eyes). Plotnikov, Oakley Bellman, MD  Active Self, Pharmacy  Records  rosuvastatin  (CRESTOR ) 10 MG tablet [id number] Yes Take 1 tablet (10 mg total) by mouth daily. Tobb, Kardie, DO Taking Active   sodium chloride  (OCEAN) 0.65 % SOLN nasal spray [id number]  Place 1 spray into both nostrils as needed for congestion. Plotnikov, Aleksei V, MD  Active Self, Pharmacy Records  traMADol  (ULTRAM ) 50 MG tablet [id number] Yes Take 1 tablet (50 mg total) by mouth every 6 (six) hours as needed for severe pain (pain score 7-10). Plotnikov, Aleksei V, MD Taking Active               Assessment/Plan:   -Has a few sedating medications - alprazolam , mirtazapine , and tramadol  - that could be contributing to fatigue. She can try taking 1/2 dose of mirtazapine  to see if she notices any difference. If her sleep worsens, she can resume 30 mg nightly. - Consider d/c Lovaza  since TG were not >500 and can increase rosuvastatin  further to lower LDL and TG. Waiting for next lipid panel. Has cardio appt in May. - Discussed that her reduced mobility and  increased sedentary lifestyle with little to no socialization may also be contributing to her symptoms. SNF may be helpful to do PT and be around people more regularly  Follow Up Plan: PRN  Rainelle Bur, PharmD, BCPS, CPP Clinical Pharmacist Practitioner [facility] [phone]   Medical screening examination/treatment/procedure(s) were performed by non-physician practitioner and as supervising physician I was immediately available for consultation/collaboration.  I agree with above. Adelaide Holy, MD

## 2023-09-13 ENCOUNTER — Other Ambulatory Visit (HOSPITAL_COMMUNITY): Payer: Self-pay | Admitting: Psychiatry

## 2023-09-13 ENCOUNTER — Other Ambulatory Visit: Payer: Self-pay

## 2023-09-13 ENCOUNTER — Ambulatory Visit: Admitting: Psychology

## 2023-09-14 ENCOUNTER — Ambulatory Visit: Payer: 59 | Admitting: Psychology

## 2023-09-14 NOTE — Telephone Encounter (Signed)
 Copied from CRM [id number]. Topic: General - Other >> Sep 14, 2023 11:10 AM Taleah C wrote: Reason for CRM: patient called and stated that her pcp completed an FL2 for skilled nursin but was told that she needs an FL2 for assisted living instead due to her being able to walk, take meds, and care for herself independently. She asked for a call back for updates on the form. Please call and advise.

## 2023-09-15 ENCOUNTER — Other Ambulatory Visit: Payer: Self-pay

## 2023-09-15 ENCOUNTER — Other Ambulatory Visit (HOSPITAL_COMMUNITY): Payer: Self-pay

## 2023-09-15 NOTE — Telephone Encounter (Signed)
 Copied from CRM [id number]. Topic: General - Other >> Sep 15, 2023  2:43 PM [name] wrote: Reason for CRM: Patient called in stated she spoke with the retirement home, and stated the [facility] form is for skilled nursing but the retirement home stated they actually needs a FL2 form for assisted living     To call and let her know when its ready and can be picked up

## 2023-09-16 ENCOUNTER — Other Ambulatory Visit (HOSPITAL_COMMUNITY): Payer: Self-pay

## 2023-09-16 NOTE — Telephone Encounter (Signed)
 Called and spoke with patient. Informed them that the form has been rewritten to reflect the change from skilled nursing to assisted living. Patient will have an associate stop by to pick up the form, as well as copies as they are looking at different facilities. Forms have been placed up front.

## 2023-09-19 ENCOUNTER — Telehealth: Payer: Self-pay | Admitting: *Deleted

## 2023-09-19 ENCOUNTER — Ambulatory Visit (INDEPENDENT_AMBULATORY_CARE_PROVIDER_SITE_OTHER): Admitting: Psychology

## 2023-09-19 DIAGNOSIS — F4321 Adjustment disorder with depressed mood: Secondary | ICD-10-CM | POA: Diagnosis not present

## 2023-09-19 DIAGNOSIS — F4323 Adjustment disorder with mixed anxiety and depressed mood: Secondary | ICD-10-CM

## 2023-09-19 NOTE — Progress Notes (Signed)
 Complex Care Management Care Guide Note  09/19/2023 Name: MAHALIA BRESLER MRN: [id number] DOB: March 26, 1951  Zilphia Hilt is a 73 y.o. year old female who is a primary care patient of Plotnikov, Aleksei V, MD and is actively engaged with the care management team. I reached out to Zilphia Hilt by phone today to assist with re-scheduling  with the RN Case Manager.  Follow up plan: Unsuccessful telephone outreach attempt made. A HIPAA compliant phone message was left for the patient providing contact information and requesting a return call.  Kandis Ormond, CMA [place]  [facility], [facility] Guide Direct Dial: [phone]  Fax: [phone] Website: [email or web address]

## 2023-09-19 NOTE — Progress Notes (Signed)
 Complex Care Management Care Guide Note  09/19/2023 Name: [name] MRN: [id number] DOB: [birth year]  [name] is a 73 y.o. year old female who is a primary care patient of Plotnikov, Aleksei V, MD and is actively engaged with the care management team. I reached out to [name] by phone today to assist with re-scheduling  with the RN Case Manager.  Follow up plan: Telephone appointment with complex care management team member scheduled for:  09/23/2023  [name], CMA, [place]  [facility], [facility] Guide Direct Dial: [phone]  Fax: [phone] Website: [email or web address]

## 2023-09-19 NOTE — Progress Notes (Signed)
 [name] is a 73 y.o. 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  Symptoms A diagnosis of an acute, serious illness that is life-threatening. (Status: maintained) -- No Description Entered  Diminished interest in or enjoyment of activities. (Status: maintained) -- No Description Entered  Lack of energy. (Status: maintained) -- No Description Entered  Sad affect, social withdrawal, anxiety, loss of interest in activities, and low energy. (Status: maintained) -- No Description Entered  Medication Status compliance  Safety unspecified If Suicidal or Homicidal State Action Taken: unspecified  Current Risk:  Medications Buspar  (Dosage: 30mg  2X/day)  New Medication (Dosage: 250mg  2x/day)  New Medication (Dosage: 250mg  2x/day)  Prozac  (Dosage: 40mg  daily)  Remeron  (Dosage: 30mg  nightly)  xanax  (Dosage: .5mg  2-3X/day)  Objectives Related Problem: Recognize, accept, and cope with feelings of depression. Description: Identify and replace thoughts and beliefs that support depression. Target Date: 2024-04-28 Frequency: Daily Modality: individual Progress: 80%  Related Problem: Recognize, accept, and cope with feelings of depression. Description: Verbalize an understanding of healthy and unhealthy emotions with the intent of increasing the use of healthy emotions to guide actions. Target Date: 2024-04-28 Frequency: Daily Modality: individual Progress: 85%  Related Problem: Recognize, accept, and cope with feelings of depression. Description: Increasingly verbalize hopeful and positive statements regarding self, others, and the future. Target Date: 2022-04-28 Frequency: Daily Modality: individual Progress: 100%  Related Problem: Reduce fear, anxiety, and worry associated  with the medical condition. Description: Share with significant others efforts to adapt successfully to the medical condition/diagnosis. Target Date: 2024-04-28 Frequency: Daily Modality: individual Progress: 85%  Related Problem: Reduce fear, anxiety, and worry associated with the medical condition. Description: Engage in social, productive, and recreational activities that are possible in spite of medical condition. Target Date: 2024-04-28 Frequency: Daily Modality: individual Progress: 90%  Related Problem: Reduce fear, anxiety, and worry associated with the medical condition. Description: Identify the coping skills and sources of emotional support that have been beneficial in the past. Target Date: 2024-04-28 Frequency: Daily Modality: individual Progress: 90%  Client Response full compliance  Service Location Location, [address] [place], [place] [address]  Service Code cpt [phone]  Related past to present  Facilitate problem solving  Identify/label emotions  Validate/empathize  Self care activities  Lifestyle change (exercise, nutrition)  Assess/facilitate readiness to change  Relaxation training  Rationally challenge thoughts or beliefs/cognitive restructuring  Comments  Dx: F43.21  Meds: Prozac , Buspar  and Remeron .  Goals: Following the failure of a third marriage, patient is struggling to rebuild her life and adjust to her new circumstances. She suffers a negative self-concept and has little confidence in her judgement. She is seeking to reduce symptoms of anxiety and self-doubt and to re-define satisfa ction moving forward. Therapy will focus on cognitive restructuring to redefine her experiences and focus on those satisfying aspects of her life. Will target this goal completion for April1, 2021 (completed). She is also wanting to utilize counseling to adjust and accept her latest diagnosis of cancer. She wants assistance to better manage this news and remain  positive throughout treatment. Goal date  is 12-22. Patient has made progress, but her medical condition is variable and she requests additional treatment to manage moods throughout her medical ordeal. In addition, she is trying to manage feelings associated with her older son's mental health struggles. Revised goal date is 12-25.   Patient agrees to a video session and is aware of the limitations of this platform. She is at home and I am in home office.   [name]: [name] says that she feels like she is "barely making it" because she is still trying to determine where she will be living. She is still determining funding, which will dictate where she can go (along with who will have availability). She is grateful that she was able to stay at her current home as long as she did, but is sad to leave and worries about the transition. Grieves at the loss of so many of her possessions. She needs the skilled nursing because she cannot fully care for herself. She says that her current home is the "best place" she has ever lived and is working hard to adjust to the concept of leaving. She feels she is also giving up her freedom and privacy. She reflects on her life and feels she was "never a good wife and never a good mother". We explored why she feels this way and how she can stay focused on gratitude.                                                                                                                                                                                                                                                                                             Crystall Donaldson LEWIS Time: 4:15p-5:00p 45 minutes

## 2023-09-20 ENCOUNTER — Encounter: Payer: Self-pay | Admitting: Licensed Clinical Social Worker

## 2023-09-22 ENCOUNTER — Other Ambulatory Visit (HOSPITAL_COMMUNITY): Payer: Self-pay

## 2023-09-23 ENCOUNTER — Other Ambulatory Visit: Payer: Self-pay | Admitting: General Practice

## 2023-09-23 ENCOUNTER — Telehealth: Payer: Self-pay

## 2023-09-23 ENCOUNTER — Other Ambulatory Visit (HOSPITAL_COMMUNITY): Payer: Self-pay

## 2023-09-23 ENCOUNTER — Other Ambulatory Visit: Payer: Self-pay

## 2023-09-23 MED ORDER — HYDRALAZINE HCL 10 MG PO TABS
ORAL_TABLET | ORAL | 9 refills | Status: AC
  Filled 2023-09-23 (×2): qty 120, 30d supply, fill #0
  Filled 2023-10-26 – 2023-11-11 (×4): qty 120, 30d supply, fill #1
  Filled 2023-11-17 – 2023-12-07 (×3): qty 120, 30d supply, fill #2
  Filled 2023-12-08 – 2024-01-15 (×4): qty 120, 30d supply, fill #3

## 2023-09-26 ENCOUNTER — Telehealth: Payer: Self-pay | Admitting: Internal Medicine

## 2023-09-26 ENCOUNTER — Other Ambulatory Visit: Payer: Self-pay

## 2023-09-26 ENCOUNTER — Ambulatory Visit (INDEPENDENT_AMBULATORY_CARE_PROVIDER_SITE_OTHER): Admitting: Psychology

## 2023-09-26 ENCOUNTER — Other Ambulatory Visit (HOSPITAL_COMMUNITY): Payer: Self-pay

## 2023-09-26 DIAGNOSIS — F4321 Adjustment disorder with depressed mood: Secondary | ICD-10-CM | POA: Diagnosis not present

## 2023-09-26 DIAGNOSIS — F4323 Adjustment disorder with mixed anxiety and depressed mood: Secondary | ICD-10-CM

## 2023-09-26 NOTE — Progress Notes (Signed)
 [name] is a 73 y.o. 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  Symptoms A diagnosis of an acute, serious illness that is life-threatening. (Status: maintained) -- No Description Entered  Diminished interest in or enjoyment of activities. (Status: maintained) -- No Description Entered  Lack of energy. (Status: maintained) -- No Description Entered  Sad affect, social withdrawal, anxiety, loss of interest in activities, and low energy. (Status: maintained) -- No Description Entered  Medication Status compliance  Safety unspecified If Suicidal or Homicidal State Action Taken: unspecified  Current Risk:  Medications Buspar  (Dosage: 30mg  2X/day)  New Medication (Dosage: 250mg  2x/day)  New Medication (Dosage: 250mg  2x/day)  Prozac  (Dosage: 40mg  daily)  Remeron  (Dosage: 30mg  nightly)  xanax  (Dosage: .5mg  2-3X/day)  Objectives Related Problem: Recognize, accept, and cope with feelings of depression. Description: Identify and replace thoughts and beliefs that support depression. Target Date: 2024-04-28 Frequency: Daily Modality: individual Progress: 80%  Related Problem: Recognize, accept, and cope with feelings of depression. Description: Verbalize an understanding of healthy and unhealthy emotions with the intent of increasing the use of healthy emotions to guide actions. Target Date: 2024-04-28 Frequency: Daily Modality: individual Progress: 85%  Related Problem: Recognize, accept, and cope with feelings of depression. Description: Increasingly verbalize hopeful and positive statements regarding self, others, and the future. Target Date: 2022-04-28 Frequency: Daily Modality: individual Progress: 100%  Related Problem: Reduce fear,  anxiety, and worry associated with the medical condition. Description: Share with significant others efforts to adapt successfully to the medical condition/diagnosis. Target Date: 2024-04-28 Frequency: Daily Modality: individual Progress: 85%  Related Problem: Reduce fear, anxiety, and worry associated with the medical condition. Description: Engage in social, productive, and recreational activities that are possible in spite of medical condition. Target Date: 2024-04-28 Frequency: Daily Modality: individual Progress: 90%  Related Problem: Reduce fear, anxiety, and worry associated with the medical condition. Description: Identify the coping skills and sources of emotional support that have been beneficial in the past. Target Date: 2024-04-28 Frequency: Daily Modality: individual Progress: 90%  Client Response full compliance  Service Location Location, [address] [place], [place] [address]  Service Code cpt [phone]  Related past to present  Facilitate problem solving  Identify/label emotions  Validate/empathize  Self care activities  Lifestyle change (exercise, nutrition)  Assess/facilitate readiness to change  Relaxation training  Rationally challenge thoughts or beliefs/cognitive restructuring  Comments  Dx: F43.21  Meds: Prozac , Buspar  and Remeron .  Goals: Following the failure of a third marriage, patient is struggling to rebuild her life and adjust to her new circumstances. She suffers a negative self-concept and has little confidence in her judgement. She is seeking to reduce symptoms of anxiety and self-doubt and to re-define satisfa ction moving forward. Therapy will focus on cognitive restructuring to redefine her experiences and focus on those satisfying aspects of her life. Will target this goal completion for April1, 2021 (completed). She is also wanting to utilize counseling to adjust and accept her latest diagnosis of cancer. She  wants assistance to better  manage this news and remain positive throughout treatment. Goal date is 12-22. Patient has made progress, but her medical condition is variable and she requests additional treatment to manage moods throughout her medical ordeal. In addition, she is trying to manage feelings associated with her older son's mental health struggles. Revised goal date is 12-25.   Patient agrees to a video session and is aware of the limitations of this platform. She is at home and I am in home office.   [name]: [name] had lunch with her son today for Mother's Day. She has an appointment at a nursing home later in week [name]). She is enthusiastic about the possibility. [name] talked about the ways that she is trying to keep herself busy during the day. Discussed ways to remain positive even when she is discouraged.                                                                                                                                                                                                                                                                                               [name] Time: 2:10p-3:00p 50 minutes

## 2023-09-26 NOTE — Telephone Encounter (Signed)
 Copied from CRM [id number]. Topic: Clinical - Medical Advice >> Sep 23, 2023  4:50 PM Jethro Morrison wrote: Reason for CRM: PT IS REQUESING A CALL FROM DR PLOTINIKOV OR HIS NURSE, SHE IS WANTING SOME HOME HEALTH ASSISTANCE AT HER HOME, STATED SHE IS NOT ABLE TO DO FOR HERSELF. SHE IS ON THE WAITLIST FOR A NURSING HOME/ASSISTANT LIVING FACILITY. BUT IS REQUESTING HELP IN HER HOME.

## 2023-09-27 ENCOUNTER — Other Ambulatory Visit (HOSPITAL_COMMUNITY): Payer: Self-pay

## 2023-09-27 ENCOUNTER — Other Ambulatory Visit: Payer: Self-pay

## 2023-09-28 ENCOUNTER — Other Ambulatory Visit: Payer: Self-pay

## 2023-09-28 ENCOUNTER — Ambulatory Visit: Payer: 59 | Admitting: Psychology

## 2023-09-28 NOTE — Patient Instructions (Signed)
 Visit Information  Thank you for taking time to visit with me today. Please don't hesitate to contact me if I can be of assistance to you before our next scheduled appointment.  Our next appointment is by telephone on 10/31/23 at 2:00 pm Please call the care guide team at [phone] if you need to cancel or reschedule your appointment.   Following is a copy of your care plan:   Goals Addressed             This Visit's Progress    VBCI RN Care Plan       Problems:  Chronic Disease Management support and education needs related to Fibromyalgia Lacks caregiver support. Level of care needs  Goal: Over the next 90days the Patient will verbalize transition to higher level of care Patient to verbalize fall prevention strategies Patient to attend provider appointments  Interventions:   Evaluation of current treatment plan related to fibromyalgia and level of care needs, Level of care concerns self-management and patient's adherence to plan as established by provider. Discussed plans with patient for ongoing care management follow up and provided patient with direct contact information for care management team Advised patient to continue to work with LCSW, [facility] place staff and PCP regarding transition to higher level of care Collaborated with LCSW and provider provided update regarding patient status and care needs Pharmacy referral for previously place to review medications for possible interactions side effects.Confirmed patient has spoken with clinical pharmacist and has no questions.   Active Listening and support provided  Patient Self-Care Activities:  Attend all scheduled provider appointments Take medications as prescribed   Work with the social worker to address care coordination needs and will continue to work with the clinical team to address health care and disease management related needs Work with the pharmacist to address medication management needs and will continue  to work with the clinical team to address health care and disease management related needs Continue fall prevention strategies  Plan:  RNCM will continue to follow             Please call the Suicide and Crisis Lifeline: 988 call the [place]  National Suicide Prevention Lifeline: 224 028 1078 or TTY: 773 791 1374 TTY 570-576-3564) to talk to a trained counselor if you are experiencing a Mental Health or Behavioral Health Crisis or need someone to talk to.  Patient verbalizes understanding of instructions and care plan provided today and agrees to view in MyChart. Active MyChart status and patient understanding of how to access instructions and care plan via MyChart confirmed with patient.     Lindi Revering, RN, MSN, BSN, CCM [place]  [facility], Population Health Case Manager Phone: [phone]

## 2023-09-28 NOTE — Telephone Encounter (Signed)
It is okay with me.  Thank you 
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
## 2023-09-29 ENCOUNTER — Encounter: Payer: Self-pay | Admitting: Internal Medicine

## 2023-10-03 ENCOUNTER — Ambulatory Visit: Admitting: Psychology

## 2023-10-03 ENCOUNTER — Other Ambulatory Visit: Payer: Self-pay | Admitting: Cardiology

## 2023-10-03 ENCOUNTER — Other Ambulatory Visit: Payer: Self-pay | Admitting: Internal Medicine

## 2023-10-03 ENCOUNTER — Other Ambulatory Visit: Payer: Self-pay

## 2023-10-03 ENCOUNTER — Other Ambulatory Visit (HOSPITAL_COMMUNITY): Payer: Self-pay

## 2023-10-03 DIAGNOSIS — F4323 Adjustment disorder with mixed anxiety and depressed mood: Secondary | ICD-10-CM
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
## 2023-10-04 ENCOUNTER — Other Ambulatory Visit: Payer: Self-pay

## 2023-10-04 MED ORDER — BUSPIRONE HCL 30 MG PO TABS
30.0000 mg | ORAL_TABLET | Freq: Two times a day (BID) | ORAL | 3 refills | Status: AC
  Filled 2023-10-04 – 2023-10-06 (×2): qty 60, 30d supply, fill #0
  Filled 2023-10-26 – 2023-11-15 (×3): qty 60, 30d supply, fill #1
  Filled 2023-11-17 – 2023-12-08 (×3): qty 60, 30d supply, fill #2
  Filled 2024-01-05 – 2024-01-15 (×3): qty 60, 30d supply, fill #3

## 2023-10-04 MED ORDER — CARVEDILOL 12.5 MG PO TABS
12.5000 mg | ORAL_TABLET | Freq: Two times a day (BID) | ORAL | 2 refills | Status: DC
  Filled 2023-10-04 – 2023-10-06 (×2): qty 60, 30d supply, fill #0
  Filled 2023-10-26 – 2023-11-08 (×2): qty 60, 30d supply, fill #1

## 2023-10-04 MED ORDER — BUPROPION HCL ER (XL) 150 MG PO TB24
150.0000 mg | ORAL_TABLET | Freq: Every morning | ORAL | 3 refills | Status: DC
Start: 2023-10-04 — End: 2023-10-24
  Filled 2023-10-04 – 2023-10-06 (×2): qty 30, 30d supply, fill #0

## 2023-10-05 ENCOUNTER — Other Ambulatory Visit (HOSPITAL_COMMUNITY): Payer: Self-pay

## 2023-10-05 ENCOUNTER — Ambulatory Visit: Admitting: Internal Medicine

## 2023-10-06 ENCOUNTER — Other Ambulatory Visit: Payer: Self-pay

## 2023-10-06 ENCOUNTER — Other Ambulatory Visit (HOSPITAL_COMMUNITY): Payer: Self-pay

## 2023-10-07 ENCOUNTER — Other Ambulatory Visit: Payer: Self-pay

## 2023-10-12 ENCOUNTER — Ambulatory Visit: Payer: 59 | Admitting: Psychology

## 2023-10-13 ENCOUNTER — Ambulatory Visit: Admitting: Psychology

## 2023-10-13 ENCOUNTER — Ambulatory Visit: Payer: 59 | Admitting: General Practice

## 2023-10-13 DIAGNOSIS — F4323 Adjustment disorder with mixed anxiety and depressed mood: Secondary | ICD-10-CM

## 2023-10-13 NOTE — Progress Notes (Signed)
 [name] is a 73 y.o. 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  Symptoms A diagnosis of an acute, serious illness that is life-threatening. (Status: maintained) -- No Description Entered  Diminished interest in or enjoyment of activities. (Status: maintained) -- No Description Entered  Lack of energy. (Status: maintained) -- No Description Entered  Sad affect, social withdrawal, anxiety, loss of interest in activities, and low energy. (Status: maintained) -- No Description Entered  Medication Status compliance  Safety unspecified If Suicidal or Homicidal State Action Taken: unspecified  Current Risk:  Medications Buspar  (Dosage: 30mg  2X/day)  New Medication (Dosage: 250mg  2x/day)  New Medication (Dosage: 250mg  2x/day)  Prozac  (Dosage: 40mg  daily)  Remeron  (Dosage: 30mg  nightly)  xanax  (Dosage: .5mg  2-3X/day)  Objectives Related Problem: Recognize, accept, and cope with feelings of depression. Description: Identify and replace thoughts and beliefs that support depression. Target Date: 2024-04-28 Frequency: Daily Modality: individual Progress: 80%  Related Problem: Recognize, accept, and cope with feelings of depression. Description: Verbalize an understanding of healthy and unhealthy emotions with the intent of increasing the use of healthy emotions to guide actions. Target Date: 2024-04-28 Frequency: Daily Modality: individual Progress: 85%  Related Problem: Recognize, accept, and cope with feelings of depression. Description: Increasingly verbalize hopeful and positive statements regarding self, others, and the future. Target Date: 2022-04-28 Frequency: Daily Modality:  individual Progress: 100%  Related Problem: Reduce fear, anxiety, and worry associated with the medical condition. Description: Share with significant others efforts to adapt successfully to the medical condition/diagnosis. Target Date: 2024-04-28 Frequency: Daily Modality: individual Progress: 85%  Related Problem: Reduce fear, anxiety, and worry associated with the medical condition. Description: Engage in social, productive, and recreational activities that are possible in spite of medical condition. Target Date: 2024-04-28 Frequency: Daily Modality: individual Progress: 90%  Related Problem: Reduce fear, anxiety, and worry associated with the medical condition. Description: Identify the coping skills and sources of emotional support that have been beneficial in the past. Target Date: 2024-04-28 Frequency: Daily Modality: individual Progress: 90%  Client Response full compliance  Service Location Location, [address] [place], [place] [address]  Service Code cpt [phone]  Related past to present  Facilitate problem solving  Identify/label emotions  Validate/empathize  Self care activities  Lifestyle change (exercise, nutrition)  Assess/facilitate readiness to change  Relaxation training  Rationally challenge thoughts or beliefs/cognitive restructuring  Comments  Dx: F43.21  Meds: Prozac , Buspar  and Remeron .  Goals: Following the failure of a third marriage, patient is struggling to rebuild her life and adjust to her new circumstances. She suffers a negative self-concept and has little confidence in her judgement. She is seeking to reduce symptoms of anxiety and self-doubt and to re-define satisfa ction moving forward. Therapy will focus on cognitive restructuring to redefine her experiences and focus on those satisfying  aspects of her life. Will target this goal completion for April1, 2021 (completed). She is also wanting to utilize counseling to adjust and accept her  latest diagnosis of cancer. She wants assistance to better manage this news and remain positive throughout treatment. Goal date is 12-22. Patient has made progress, but her medical condition is variable and she requests additional treatment to manage moods throughout her medical ordeal. In addition, she is trying to manage feelings associated with her older son's mental health struggles. Revised goal date is 12-25.   Patient agrees to a video session and is aware of the limitations of this platform. She is at home and I am in home office.   [name]: [name] states that she feels like "giving up". This is not typical for her. She says she doesn't want to die, but she is feeling overwhelmed, "confused and disturbed". Says she cannot do anything. She has "lost motivation" right now and this has worsened over the past couple of weeks. She isn't sure she can regroup and make herself do anything. She is very depressed and doesn't feel she has a future. She is not getting any responses from nursing homes and she says she feels "numb". She says she does not know what to do and that she has done all she can. I suggested she reach out to Dr. Levie Ream. She doesn't feel she can get to an appointment. She claims she is not suicidal, but says she is out of hope or drive. She says that her brother and sister came over to get items out of her house that they want at [facility] invitation because she was planning to move to a nursing home. She gave me permission to contact her son [name] and Dr. Levie Ream to discuss her condition. Suggested that a brief hospitalization may be helpful, but she says she does not feel it will help. Her uncle is coming to her house today at 1:00 to take her to nursing home to talk with the person she had talked to.  [name] Time: 11:45a-12:30p 45 minutes

## 2023-10-17 ENCOUNTER — Ambulatory Visit (INDEPENDENT_AMBULATORY_CARE_PROVIDER_SITE_OTHER): Admitting: Psychology

## 2023-10-17 ENCOUNTER — Telehealth: Payer: Self-pay | Admitting: *Deleted

## 2023-10-17 ENCOUNTER — Telehealth: Payer: Self-pay | Admitting: Internal Medicine

## 2023-10-17 DIAGNOSIS — F4323 Adjustment disorder with mixed anxiety and depressed mood: Secondary | ICD-10-CM

## 2023-10-17 NOTE — Progress Notes (Signed)
 ZANASIA HICKSON is a 73 y.o. 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  Symptoms A diagnosis of an acute, serious illness that is life-threatening. (Status: maintained) -- No Description Entered  Diminished interest in or enjoyment of activities. (Status: maintained) -- No Description Entered  Lack of energy. (Status: maintained) -- No Description Entered  Sad affect, social withdrawal, anxiety, loss of interest in activities, and low energy. (Status: maintained) -- No Description Entered  Medication Status compliance  Safety unspecified If Suicidal or Homicidal State Action Taken: unspecified  Current Risk:  Medications Buspar  (Dosage: 30mg  2X/day)  New Medication (Dosage: 250mg  2x/day)  New Medication (Dosage: 250mg  2x/day)  Prozac  (Dosage: 40mg  daily)  Remeron  (Dosage: 30mg  nightly)  xanax  (Dosage: .5mg  2-3X/day)  Objectives Related Problem: Recognize, accept, and cope with feelings of depression. Description: Identify and replace thoughts and beliefs that support depression. Target Date: 2024-04-28 Frequency: Daily Modality: individual Progress: 80%  Related Problem: Recognize, accept, and cope with feelings of depression. Description: Verbalize an understanding of healthy and unhealthy emotions with the intent of increasing the use of healthy emotions to guide actions. Target Date: 2024-04-28 Frequency: Daily Modality: individual Progress: 85%  Related Problem: Recognize, accept, and cope with feelings of depression. Description: Increasingly verbalize hopeful and positive statements regarding self, others, and the future. Target Date:  2022-04-28 Frequency: Daily Modality: individual Progress: 100%  Related Problem: Reduce fear, anxiety, and worry associated with the medical condition. Description: Share with significant others efforts to adapt successfully to the medical condition/diagnosis. Target Date: 2024-04-28 Frequency: Daily Modality: individual Progress: 85%  Related Problem: Reduce fear, anxiety, and worry associated with the medical condition. Description: Engage in social, productive, and recreational activities that are possible in spite of medical condition. Target Date: 2024-04-28 Frequency: Daily Modality: individual Progress: 90%  Related Problem: Reduce fear, anxiety, and worry associated with the medical condition. Description: Identify the coping skills and sources of emotional support that have been beneficial in the past. Target Date: 2024-04-28 Frequency: Daily Modality: individual Progress: 90%  Client Response full compliance  Service Location Location, [address] [place], [place] [address]  Service Code cpt [phone]  Related past to present  Facilitate problem solving  Identify/label emotions  Validate/empathize  Self care activities  Lifestyle change (exercise, nutrition)  Assess/facilitate readiness to change  Relaxation training  Rationally challenge thoughts or beliefs/cognitive restructuring  Comments  Dx: F43.21  Meds: Prozac , Buspar  and Remeron .  Goals: Following the failure of a third marriage, patient is struggling to rebuild her life and adjust to her new circumstances. She suffers a negative self-concept and has little confidence in her judgement. She is seeking to reduce symptoms of anxiety and self-doubt and to re-define satisfa ction moving forward.  Therapy will focus on cognitive restructuring to redefine her experiences and focus on those satisfying aspects of her life. Will target this goal completion for April1, 2021 (completed). She is also wanting to  utilize counseling to adjust and accept her latest diagnosis of cancer. She wants assistance to better manage this news and remain positive throughout treatment. Goal date is 12-22. Patient has made progress, but her medical condition is variable and she requests additional treatment to manage moods throughout her medical ordeal. In addition, she is trying to manage feelings associated with her older son's mental health struggles. Revised goal date is 12-25.   Patient agrees to a video session and is aware of the limitations of this platform. She is at home and I am in home office.   [name]: Shernell Saldierna says "I'm not suicidal but I have never felt this bad before. 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 She is going to see Dr. Levie Ream tomorrow and says he told her is is going to try a new anti-depressant with her. She has little confidence that anything will help. She is drowning in paperwork to get into assisted living. She says she has no energy for self care. Talked about the things she can do to stay positive in these "dark times". Will contact me after appointment with Dr. Levie Ream.                                                                                                                                                                                                                                                                                                     Shabazz Mckey LEWIS Time: 4:15p-5:00p 45 minutes

## 2023-10-17 NOTE — Telephone Encounter (Signed)
 Copied from CRM [id number]. Topic: MyChart - Other >> Oct 17, 2023 10:49 AM Emmet Harm C wrote: Reason for EAV:[id number] wants  A copy of the FML 2 form for pick up from friend  Murry Art  ---  Do we have a copy of pt's most recent FL2? TDW

## 2023-10-17 NOTE — Progress Notes (Signed)
 Complex Care Management Care Guide Note  10/17/2023 Name: [name] MRN: [id number] DOB: July 20, 1950  [name] is a 73 y.o. year old female who is a primary care patient of Plotnikov, Aleksei V, MD and is actively engaged with the care management team. I reached out to [name] by phone today to assist with re-scheduling  with the Licensed Clinical Child psychotherapist.  Follow up plan: Unsuccessful telephone outreach attempt made. A HIPAA compliant phone message was left for the patient providing contact information and requesting a return call.  Barnie Bora  [facility]  [facility]Based [facility] Institute, [facility] Guide  Direct Dial: [phone]  Fax [phone]

## 2023-10-18 NOTE — Progress Notes (Signed)
 Complex Care Management Note Care Guide Note  10/18/2023 Name: [name] MRN: [id number] DOB: [birth year]   Complex Care Management Outreach Attempts: A second unsuccessful outreach was attempted today to offer the patient with information about available complex care management services.  Follow Up Plan:  Additional outreach attempts will be made to offer the patient complex care management information and services.   Encounter Outcome:  No Answer  Barnie Bora  [facility]  [facility], [facility] Guide  Direct Dial: [phone]  Fax [phone]

## 2023-10-19 NOTE — Telephone Encounter (Signed)
 LVM for pt informing her that paperwork has been placed up front and is ready for pick up

## 2023-10-21 ENCOUNTER — Telehealth: Payer: Self-pay | Admitting: Internal Medicine

## 2023-10-21 ENCOUNTER — Ambulatory Visit: Payer: Self-pay | Admitting: Internal Medicine

## 2023-10-21 NOTE — Telephone Encounter (Signed)
 Copied from CRM [id number]. Topic: General - Other >> Oct 21, 2023  9:06 AM Turkey A wrote: Reason for CRM: Patient called for [employer] Number, Agent called [facility] was informed that they do not have her direct number but can send a message.

## 2023-10-21 NOTE — Telephone Encounter (Signed)
 FYI Only or Action Required?: Action required by provider  Patient was last seen in primary care on 08/17/2023 by Plotnikov, Oakley Bellman, MD. Called Nurse Triage reporting Depression. Symptoms began several years ago. Interventions attempted: Prescription medications:  Aaron Aas Symptoms are: rapidly worsening.  Triage Disposition: Call EMS 911 Now  Patient/caregiver understands and will follow disposition?: No"I am not going to do anything to hurt myself." Patient states, "I will be okay until appointment on Monday." Patient wants to talk to [place], a Child psychotherapist she has talked to before. Patient would like to discuss the difficulty she is having with finding an assisted living facility that offers memory care. This RN provided patient with the phone number and address for [facility]. "If I start feeling worse I will call them." This RN notified [facility] of pt ED refusal.  Patient call back number is: [phone]               Copied from CRM [id number]. Topic: Clinical - Red Word Triage >> Oct 21, 2023  9:10 AM Albertha Alosa wrote: [facility] that prompted transfer to Nurse Triage: Patient called in stated she is really depressed, wants to go to sleep and not wake up. Has been trying for the last several months, leave home alone and has bipolar and depression. On so many medications she is getting confused on how many medication she has to take. Getting so depressed she doesnt know if she can keep doing this. Doesn't feel like she is suicidal but wants to go to sleep and not wake up Reason for Disposition  Patient is threatening suicide now  Answer Assessment - Initial Assessment Questions CONCERN: "What happened that made you call today?"     "I am really depressed. I have been this way before. I am bipolar. I am on several antidepressants."   DEPRESSION SYMPTOM SCREENING: "How are you feeling overall?" (e.g., decreased energy, increased sleeping or difficulty sleeping, difficulty  concentrating, feelings of sadness, guilt, hopelessness, or worthlessness)     "Sleeping too much. I don't have any energy. I can't concentrate."  RISK OF HARM - SUICIDAL IDEATION:       "Not suicidal, but I feel like I want to go to sleep and not wake up"  RISK OF HARM - HOMICIDAL IDEATION:  "Do you ever have thoughts of hurting or killing someone else?"  (e.g., yes, no, no but preoccupation with thoughts about death)     No  FUNCTIONAL IMPAIRMENT: "How have things been going for you overall? Have you had more difficulty than usual doing your normal daily activities?"  (e.g., better, same, worse; self-care, school, work, interactions)     Difficulty concerntrating  SUPPORT: "Who is with you now?" "Who do you live with?" "Do you have family or friends who you can talk to?"      Patient is trying to move into an assisted living facility. She has trouble managing her medications  Protocols used: Depression-A-AH

## 2023-10-24 ENCOUNTER — Ambulatory Visit (INDEPENDENT_AMBULATORY_CARE_PROVIDER_SITE_OTHER): Admitting: Internal Medicine

## 2023-10-24 ENCOUNTER — Encounter: Payer: Self-pay | Admitting: Internal Medicine

## 2023-10-24 VITALS — BP 130/86 | HR 86 | Ht 60.0 in | Wt 172.6 lb

## 2023-10-24 DIAGNOSIS — C499 Malignant neoplasm of connective and soft tissue, unspecified: Secondary | ICD-10-CM | POA: Diagnosis not present

## 2023-10-24 DIAGNOSIS — R5383 Other fatigue: Secondary | ICD-10-CM

## 2023-10-24 DIAGNOSIS — R270 Ataxia, unspecified: Secondary | ICD-10-CM

## 2023-10-24 DIAGNOSIS — R5381 Other malaise: Secondary | ICD-10-CM

## 2023-10-24 DIAGNOSIS — F4322 Adjustment disorder with anxiety: Secondary | ICD-10-CM

## 2023-10-24 DIAGNOSIS — F332 Major depressive disorder, recurrent severe without psychotic features: Secondary | ICD-10-CM

## 2023-10-24 NOTE — Progress Notes (Signed)
 Complex Care Management Care Guide Note  10/24/2023 Name: KEMYA SHED MRN: [id number] DOB: 1950-11-23  Zilphia Hilt is a 73 y.o. year old female who is a primary care patient of Plotnikov, Aleksei V, MD and is actively engaged with the care management team. I reached out to Zilphia Hilt by phone today to assist with re-scheduling  with the Licensed Clinical Child psychotherapist.  Follow up plan: Unsuccessful telephone outreach attempt made. A HIPAA compliant phone message was left for the patient providing contact information and requesting a return call.No further outreach attempts will be made at this time. We have been unable to contact the patient to reschedule for complex care management services.   Barnie Bora  [facility]  Value-Based Care Institute, [facility] Guide  Direct Dial: [phone]  Fax [phone]

## 2023-10-24 NOTE — Progress Notes (Signed)
 Subjective:  Patient ID: [name], female    DOB: 28-Apr-1951  Age: 73 y.o. MRN: [id number]  CC: Follow-up (Follow up. Psychiatrist would like patient to stop welbutrin and prozac . Continue remeron . Start Auvelity (would like to discuss with Dr.Aneudy Champlain before starting))   HPI [name] presents for a f/u on depression, weakness, cancer. Psychiatrist, Dr Levie Ream would like patient to stop welbutrin and prozac . Continue remeron . Starting Auvelity (would like to discuss with Dr.Lela Murfin before starting)  She is here w/her friend Vickie. Planning to move to a facility in [place]  Outpatient Medications Prior to Visit  Medication Sig Dispense Refill   ALPRAZolam  (XANAX ) 1 MG tablet Take 1 tablet (1 mg total) by mouth 3 (three) times daily as needed for anxiety. 90 tablet 1   ascorbic acid  (VITAMIN C ) 500 MG tablet Take 1 tablet (500 mg total) by mouth daily. 100 tablet 3   aspirin  EC 81 MG tablet Take 1 tablet (81 mg total) by mouth daily. Swallow whole. 30 tablet 3   busPIRone  (BUSPAR ) 30 MG tablet Take 1 tablet (30 mg total) by mouth 2 (two) times daily. 60 tablet 3   carvedilol  (COREG ) 12.5 MG tablet Take 1 tablet (12.5 mg total) by mouth 2 (two) times daily with a meal. 60 tablet 2   Cholecalciferol  (D3 5000) 125 MCG (5000 UT) capsule Take 1 capsule (5,000 Units total) by mouth daily. 100 capsule 3   cloNIDine  (CATAPRES ) 0.2 MG tablet Take 1 tablet (0.2 mg total) by mouth at bedtime. 90 tablet 3   colesevelam  (WELCHOL ) 625 MG tablet Take 2 tablets (1,250 mg total) by mouth 2 (two) times daily with a meal. 360 tablet 3   cyanocobalamin  (VITAMIN B12) 1000 MCG tablet Take 1 tablet (1,000 mcg total) by mouth daily. Take 1,000 mcg by mouth daily. 100 tablet 3   D-Mannose 500 MG CAPS Take 500 mg by mouth 2 (two) times daily. WITH CRANBERRY & DANDELION EXTRACT 180 capsule 3   esomeprazole  (NEXIUM ) 40 MG capsule Take 1 capsule (40 mg total) by mouth every morning. 90  capsule 3   FLUoxetine  (PROZAC ) 40 MG capsule Take 1 capsule (40 mg total) by mouth in the morning. 90 capsule 1   hydrALAZINE  (APRESOLINE ) 10 MG tablet Take 1 tablet by mouth 3 times daily. MAY TAKE EXTRA 10MG  FOR SBP>160 120 tablet 9   iron  polysaccharides (POLY-IRON  150) 150 MG capsule Take 1 capsule (150 mg total) by mouth daily. (Patient taking differently: Take 150 mg by mouth 2 (two) times daily.) 90 capsule 3   Lactobacillus (ACIDOPHILUS) CAPS capsule Take 1 capsule by mouth daily. 100 capsule 3   levothyroxine  (SYNTHROID ) 100 MCG tablet Take 1 tablet (100 mcg total) by mouth daily before breakfast. 30 tablet 11   Metamucil Fiber 2 g CHEW Chew 2 tablets by mouth in the morning and at bedtime. 100 tablet 11   mirtazapine  (REMERON ) 30 MG tablet Take 1 tablet (30 mg total) by mouth at bedtime. 30 tablet 5   omega-3 acid ethyl esters (LOVAZA ) 1 g capsule Take 2 capsules (2 g total) by mouth 2 (two) times daily. 120 capsule 11   OVER THE COUNTER MEDICATION Take 1 capsule by mouth daily. 11 MUSHROOM BLEND     Polyethyl Glycol-Propyl Glycol (SYSTANE) 0.4-0.3 % SOLN Place 1 drop into both eyes 3 (three) times daily as needed (dry eyes). Place 1 drop into both eyes 3 (three) times daily as needed (dry eyes). 15 mL 2  rosuvastatin  (CRESTOR ) 10 MG tablet Take 1 tablet (10 mg total) by mouth daily. 90 tablet 3   sodium chloride  (OCEAN) 0.65 % SOLN nasal spray Place 1 spray into both nostrils as needed for congestion. 30 mL 1   traMADol  (ULTRAM ) 50 MG tablet Take 1 tablet (50 mg total) by mouth every 6 (six) hours as needed for severe pain (pain score 7-10). 120 tablet 2   ciprofloxacin  (CIPRO ) 250 MG tablet Take 1 tablet (250 mg total) by mouth 2 (two) times daily. (Patient not taking: Reported on 08/25/2023) 14 tablet 2   ipratropium-albuterol  (DUONEB) 0.5-2.5 (3) MG/3ML SOLN Take 3 mLs by nebulization every 6 (six) hours as needed (Asthma). (Patient not taking: Reported on 10/24/2023) 360 mL 3    loperamide  (IMODIUM  A-D) 2 MG tablet Take 1 tablet (2 mg total) by mouth 4 (four) times daily as needed for diarrhea or loose stools. (Patient not taking: Reported on 10/24/2023) 30 tablet 3   ondansetron  (ZOFRAN ) 4 MG tablet Take 1 tablet (4 mg total) by mouth every 8 (eight) hours as needed for nausea or vomiting. (Patient not taking: Reported on 10/24/2023) 30 tablet 0   buPROPion  (WELLBUTRIN  XL) 150 MG 24 hr tablet Take 1 tablet (150 mg total) by mouth in the morning. (Patient not taking: Reported on 10/24/2023) 30 tablet 3   No facility-administered medications prior to visit.    ROS: Review of Systems  Constitutional:  Positive for fatigue. Negative for activity change, appetite change, chills and unexpected weight change.  HENT:  Negative for congestion, mouth sores and sinus pressure.   Eyes:  Negative for visual disturbance.  Respiratory:  Negative for cough and chest tightness.   Gastrointestinal:  Negative for abdominal pain and nausea.  Genitourinary:  Negative for difficulty urinating, frequency and vaginal pain.  Musculoskeletal:  Positive for arthralgias, back pain and gait problem.  Skin:  Negative for pallor and rash.  Neurological:  Positive for dizziness, weakness and numbness. Negative for tremors and headaches.  Hematological:  Bruises/bleeds easily.  Psychiatric/Behavioral:  Positive for decreased concentration, dysphoric mood and sleep disturbance. Negative for confusion and suicidal ideas. The patient is nervous/anxious.     Objective:  BP 130/86   Pulse 86   Ht 5' (1.524 m)   Wt 172 lb 9.6 oz (78.3 kg)   SpO2 94%   BMI 33.71 kg/m   BP Readings from Last 3 Encounters:  10/24/23 130/86  07/28/23 130/88  07/12/23 112/80    Wt Readings from Last 3 Encounters:  10/24/23 172 lb 9.6 oz (78.3 kg)  09/06/23 160 lb (72.6 kg)  07/28/23 176 lb (79.8 kg)    Physical Exam Constitutional:      General: She is not in acute distress.    Appearance: She is  well-developed. She is obese.  HENT:     Head: Normocephalic.     Right Ear: External ear normal.     Left Ear: External ear normal.     Nose: Nose normal.  Eyes:     General:        Right eye: No discharge.        Left eye: No discharge.     Conjunctiva/sclera: Conjunctivae normal.     Pupils: Pupils are equal, round, and reactive to light.  Neck:     Thyroid : No thyromegaly.     Vascular: No JVD.     Trachea: No tracheal deviation.  Cardiovascular:     Rate and Rhythm: Normal rate and regular rhythm.  Heart sounds: Normal heart sounds.  Pulmonary:     Effort: No respiratory distress.     Breath sounds: No stridor. No wheezing.  Abdominal:     General: Bowel sounds are normal. There is no distension.     Palpations: Abdomen is soft. There is no mass.     Tenderness: There is no abdominal tenderness. There is no guarding or rebound.  Musculoskeletal:        General: No tenderness.     Cervical back: Normal range of motion and neck supple. No rigidity.     Right lower leg: No edema.     Left lower leg: No edema.  Lymphadenopathy:     Cervical: No cervical adenopathy.  Skin:    Findings: No erythema or rash.  Neurological:     Mental Status: She is oriented to person, place, and time.     Cranial Nerves: No cranial nerve deficit.     Motor: Weakness present. No abnormal muscle tone.     Coordination: Coordination abnormal.     Gait: Gait abnormal.     Deep Tendon Reflexes: Reflexes normal.  Psychiatric:        Behavior: Behavior normal.        Thought Content: Thought content normal.        Judgment: Judgment normal.     A total time of 45 minutes was spent preparing to see the patient, reviewing tests, x-rays, operative reports (neck) and other medical records.  Also, obtaining history and performing comprehensive physical exam.  Additionally, counseling the patient regarding the above listed issues (FTT, cancer) and pacement.   Finally, documenting clinical  information in the health records, coordination of care, educating the patient. It is a complex case.   Lab Results  Component Value Date   WBC 7.1 07/28/2023   HGB 12.8 07/28/2023   HCT 38.2 07/28/2023   PLT 342.0 07/28/2023   GLUCOSE 99 07/28/2023   CHOL 277 (H) 09/23/2022   TRIG 276 (H) 09/23/2022   HDL 51 09/23/2022   LDLDIRECT 160.0 10/18/2018   LDLCALC 174 (H) 09/23/2022   ALT 18 07/28/2023   AST 21 07/28/2023   NA 141 07/28/2023   K 4.0 07/28/2023   CL 106 07/28/2023   CREATININE 1.26 (H) 07/28/2023   BUN 29 (H) 07/28/2023   CO2 27 07/28/2023   TSH 2.76 07/28/2023   INR 1.0 07/15/2020   HGBA1C 5.8 03/01/2022   MICROALBUR 0.8 12/16/2021    No results found.  Assessment & Plan:   Problem List Items Addressed This Visit     Major depressive disorder, recurrent, severe without psychotic features (HCC)   Dr Levie Ream would like patient to stop welbutrin and prozac . Continue remeron . Starting [employer].       Relevant Orders   CBC with Differential/Platelet   TSH   Adjustment disorder with anxiety   Dr Levie Ream would like patient to stop welbutrin and prozac . Continue remeron . Starting Auvelity.       Leiomyosarcoma (HCC)    FTT: planning to move to an assisted living - worsening weakness      Relevant Orders   Comprehensive metabolic panel with GFR   CBC with Differential/Platelet   TSH   Urinalysis   Malaise and fatigue - Primary   Worse FTT at home Planning to move to [facility] in [place]      Relevant Orders   Comprehensive metabolic panel with GFR   CBC with Differential/Platelet   TSH   Urinalysis  Ataxia    FTT: planning to move to an assisted living - worsening weakness May need PT      Relevant Orders   Comprehensive metabolic panel with GFR   CBC with Differential/Platelet   TSH   Urinalysis      No orders of the defined types were placed in this encounter.     Follow-up: Return in about 3 months (around 01/24/2024) for a  follow-up visit.  Anitra Barn, MD

## 2023-10-24 NOTE — Assessment & Plan Note (Signed)
 FTT: planning to move to an assisted living - worsening weakness

## 2023-10-24 NOTE — Assessment & Plan Note (Signed)
 FTT: planning to move to an assisted living - worsening weakness May need PT

## 2023-10-24 NOTE — Assessment & Plan Note (Signed)
 Dr Levie Ream would like patient to stop welbutrin and prozac . Continue remeron . Starting Auvelity.

## 2023-10-24 NOTE — Assessment & Plan Note (Signed)
 Worse FTT at home Planning to move to [facility] in [place]

## 2023-10-26 ENCOUNTER — Ambulatory Visit (INDEPENDENT_AMBULATORY_CARE_PROVIDER_SITE_OTHER): Payer: 59 | Admitting: Psychology

## 2023-10-26 ENCOUNTER — Other Ambulatory Visit: Payer: Self-pay

## 2023-10-26 DIAGNOSIS — F4323 Adjustment disorder with mixed anxiety and depressed mood: Secondary | ICD-10-CM | POA: Diagnosis not present

## 2023-10-26 NOTE — Progress Notes (Signed)
 [name] is a 73 y.o. female patient  Date: 10/26/2023  Treatment Plan Diagnosis F43.21 (Adjustment Disorder with mixed anxiety and depressed mood.  Symptoms A diagnosis of an acute, serious illness that is life-threatening. (Status: maintained) -- No Description Entered  Diminished interest in or enjoyment of activities. (Status: maintained) -- No Description Entered  Lack of energy. (Status: maintained) -- No Description Entered  Sad affect, social withdrawal, anxiety, loss of interest in activities, and low energy. (Status: maintained) -- No Description Entered  Medication Status compliance  Safety unspecified If Suicidal or Homicidal State Action Taken: unspecified  Current Risk:  Medications Buspar  (Dosage: 30mg  2X/day)  New Medication (Dosage: 250mg  2x/day)  New Medication (Dosage: 250mg  2x/day)  Prozac  (Dosage: 40mg  daily)  Remeron  (Dosage: 30mg  nightly)  xanax  (Dosage: .5mg  2-3X/day)  Objectives Related Problem: Recognize, accept, and cope with feelings of depression. Description: Identify and replace thoughts and beliefs that support depression. Target Date: 2024-04-28 Frequency: Daily Modality: individual Progress: 80%  Related Problem: Recognize, accept, and cope with feelings of depression. Description: Verbalize an understanding of healthy and unhealthy emotions with the intent of increasing the use of healthy emotions to guide actions. Target Date: 2024-04-28 Frequency: Daily Modality: individual Progress: 85%  Related Problem: Recognize, accept, and cope with feelings of depression. Description: Increasingly verbalize hopeful and positive statements regarding self, others, and the  future. Target Date: 2022-04-28 Frequency: Daily Modality: individual Progress: 100%  Related Problem: Reduce fear, anxiety, and worry associated with the medical condition. Description: Share with significant others efforts to adapt successfully to the medical condition/diagnosis. Target Date: 2024-04-28 Frequency: Daily Modality: individual Progress: 85%  Related Problem: Reduce fear, anxiety, and worry associated with the medical condition. Description: Engage in social, productive, and recreational activities that are possible in spite of medical condition. Target Date: 2024-04-28 Frequency: Daily Modality: individual Progress: 90%  Related Problem: Reduce fear, anxiety, and worry associated with the medical condition. Description: Identify the coping skills and sources of emotional support that have been beneficial in the past. Target Date: 2024-04-28 Frequency: Daily Modality: individual Progress: 90%  Client Response full compliance  Service Location Location, [address] [place], [place] [address]  Service Code cpt [phone]  Related past to present  Facilitate problem solving  Identify/label emotions  Validate/empathize  Self care activities  Lifestyle change (exercise, nutrition)  Assess/facilitate readiness to change  Relaxation training  Rationally challenge thoughts or beliefs/cognitive restructuring  Comments  Dx: F43.21  Meds: Prozac , Buspar  and Remeron .  Goals: Following the failure of a third marriage, patient is struggling to rebuild her life and adjust to her new circumstances. She suffers a negative self-concept and has little confidence in her judgement. She is  seeking to reduce symptoms of anxiety and self-doubt and to re-define satisfa ction moving forward. Therapy will focus on cognitive restructuring to redefine her experiences and focus on those satisfying aspects of her life. Will target this goal completion for April1, 2021 (completed). She is  also wanting to utilize counseling to adjust and accept her latest diagnosis of cancer. She wants assistance to better manage this news and remain positive throughout treatment. Goal date is 12-22. Patient has made progress, but her medical condition is variable and she requests additional treatment to manage moods throughout her medical ordeal. In addition, she is trying to manage feelings associated with her older son's mental health struggles. Revised goal date is 12-25.   Patient agrees to a video session and is aware of the limitations of this platform. She is at home and I am in home office.   [name]: Jakyria Bleau says she is exhausted and still depressed. Dr. Levie Ream switched away from Prozac  and Wellbutrin  and started her on Auvelity (45mg /105mg ). Still taking Remeron , Xanax  and Buspar . Still seeking to find a placement and is getting help from primary care office. The fact that she does not know where she is going and when is contributing to her stress and anxiety and depression. Feeling very discouraged. Her son Leeland Pugh is not responding at all to her other than to text her that he is really busy. This contributes to feelings of abandonment and worry about this mental health. She has a plan to continue to seek a placement and is remaining optimistic she will find something.                                                                                                                                                                                                                                                                                                         Ayeisha Lindenberger LEWIS Time: 3:10p-4:00p 50 minutes

## 2023-10-27 ENCOUNTER — Telehealth: Payer: Self-pay

## 2023-10-27 ENCOUNTER — Inpatient Hospital Stay: Admitting: Gynecologic Oncology

## 2023-10-27 DIAGNOSIS — C55 Malignant neoplasm of uterus, part unspecified: Secondary | ICD-10-CM

## 2023-10-27 NOTE — Telephone Encounter (Signed)
 Spoke with patient and she had left message as she needed to reschedule her appointment from today 6/12.  Rescheduled for 8/14 at 3:15pm.. patient confirmed.[name]

## 2023-10-27 NOTE — Progress Notes (Signed)
 Patient canceled her appt

## 2023-10-28 ENCOUNTER — Telehealth: Payer: Self-pay

## 2023-10-28 NOTE — Telephone Encounter (Signed)
 Left message for patient to call back and see about getting her CT scan scheduled.  Per Dr Orvil Bland ok to go ahead and schedule this.

## 2023-10-28 NOTE — Telephone Encounter (Signed)
 [name] returned call from April. She is aware of advice from Dr.Tucker regarding the CT scan. Centralized scheduling number provided, pt states she will call and schedule the scan.

## 2023-10-31 ENCOUNTER — Other Ambulatory Visit: Payer: Self-pay

## 2023-10-31 ENCOUNTER — Ambulatory Visit (INDEPENDENT_AMBULATORY_CARE_PROVIDER_SITE_OTHER): Admitting: Psychology

## 2023-10-31 ENCOUNTER — Other Ambulatory Visit: Payer: Self-pay | Admitting: Licensed Clinical Social Worker

## 2023-10-31 ENCOUNTER — Telehealth: Payer: Self-pay | Admitting: Internal Medicine

## 2023-10-31 DIAGNOSIS — F4323 Adjustment disorder with mixed anxiety and depressed mood: Secondary | ICD-10-CM

## 2023-10-31 NOTE — Patient Instructions (Signed)
 Visit Information  Thank you for taking time to visit with me today. Please don't hesitate to contact me if I can be of assistance to you before our next scheduled appointment.  Client has LCSW name and phone number and said she would call LCSW as needed for SW support  Please call the care guide team at [phone] if you need to cancel or reschedule your appointment.   Following is a copy of your care plan:   Goals Addressed             This Visit's Progress    VBCI Social Work Care Plan       Problems:   LOC challenges             Fatigue             Memory issues             Decreased family support            Gait instability  CSW Clinical Goal(s):   Over the next 30 days the Patient will attend all scheduled medical appointments as evidenced by patient report and care team review of appointment completion in electronic MEDICAL RECORD NUMBER .               Over next 30 days patient will tour ALF facilities in area to try to determine ALF facility of choice to help meet patient needs AEB patient report of same  Interventions:  Discussed Medicaid application process for ALF facility             Discussed ALF facilities in area             Discussed support of neighbor (helps client with transport needs)             Discussed program support with RN, LCSW, Pharmacist             Discussed medication procurement             Discussed ambulation (uses a walker to help her walk)  Patient Goals/Self-Care Activities:  Attend appointments scheduled with PCP            Attend appointments scheduled with psychologist and with psychiatrist            Take medications as prescribed            Tour ALF facilities in area           Communicate with RN Lindi Revering for nursing support           Communicate with LCSW as needed for SW support Plan:   Client has LCSW name and phone number and said she would call LCSW as needed for SW support        Please go to [facility] [address], [place] [phone]) if you are experiencing a Mental Health or Behavioral Health Crisis or need someone to talk to.  The patient verbalized understanding of instructions, educational materials, and care plan provided today and DECLINED offer to receive copy of patient instructions, educational materials, and care plan.    Alexandria Angel  MSW, LCSW [place] Licensed Clinical Social Worker Direct Dial:  [phone] Fax:  [phone] Website:  [email or web address]

## 2023-10-31 NOTE — Patient Instructions (Signed)
 Visit Information  Thank you for taking time to visit with me today. Please don't hesitate to contact me if I can be of assistance to you before our next scheduled appointment.  Our next appointment is by telephone on 11/15/23 at 3:00 pm Please call the care guide team at [phone] if you need to cancel or reschedule your appointment.   Following is a copy of your care plan:   Goals Addressed             This Visit's Progress    VBCI RN Care Plan       Problems:  Chronic Disease Management support and education needs related to Fibromyalgia Lacks caregiver support. Level of care needs  Goal: Over the next 90days the Patient will verbalize transition to higher level of care Patient to verbalize fall prevention strategies Patient to attend provider appointments as scheduled.  Interventions:   Evaluation of current treatment plan related to fibromyalgia and level of care needs, Level of care concerns self-management and patient's adherence to plan as established by provider. Discussed plans with patient for ongoing care management follow up and provided patient with direct contact information for care management team Advised patient to continue to work with LCSW regarding assistance with higher level of care Collaborated with LCSW today regarding patient needs and plan of care regarding patient status and care needsConfirmed patient has spoken with clinical pharmacist and has no questions.   Active Listening and support provided. Provided names of two AL facilities patient has not reached out to Discussed with patient and offered to contact [facility] navigator to discuss if possible coverage for PCS assistance if she does not find a facility that works for her soon. Patient states will keep in mind, as she states her primary goal is to find an AL facility that will work with her finances and situation. Patient reports she currently has help of family member regarding support and  transportation in her AL search.   Patient Self-Care Activities:  Attend all scheduled provider appointments Take medications as prescribed   Work with the social worker to address care coordination needs and will continue to work with the clinical team to address health care and disease management related needs Work with the pharmacist to address medication management needs and will continue to work with the clinical team to address health care and disease management related needs Continue fall prevention strategies  Plan:  Telephone follow up appointment with care management team member scheduled for:  11/15/23 at 3:00 pm             Please call the Suicide and Crisis Lifeline: 988 call the [place]  National Suicide Prevention Lifeline: (580)630-6792 or TTY: 323-278-9131 TTY (872)577-2276) to talk to a trained counselor if you are experiencing a Mental Health or Behavioral Health Crisis or need someone to talk to.  Patient verbalizes understanding of instructions and care plan provided today and agrees to view in MyChart. Active MyChart status and patient understanding of how to access instructions and care plan via MyChart confirmed with patient.     Lindi Revering, RN, MSN, BSN, CCM [place]  [facility], Population Health Case Manager Phone: [phone]

## 2023-10-31 NOTE — Telephone Encounter (Signed)
 Needs an FL2 and it has to say skilled nursing -   [facility] -   Fax #  [phone]  Attn:  Verdie Gladden - manager
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
## 2023-10-31 NOTE — Telephone Encounter (Signed)
 Patient called back and wanted to note that FL2 form needs to have medications written out. Will not accept attached med list
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
## 2023-11-01 ENCOUNTER — Other Ambulatory Visit: Payer: Self-pay | Admitting: Gynecologic Oncology

## 2023-11-01 DIAGNOSIS — C55 Malignant neoplasm of uterus, part unspecified: Secondary | ICD-10-CM

## 2023-11-01 NOTE — Telephone Encounter (Signed)
 Pt aware, via voicemail, of CT scheduled for 6/26 @ 5:30 with arrival time of 3:00 to start drinking contrast.

## 2023-11-03 NOTE — Telephone Encounter (Signed)
 Form has been completed and multiple copies placed up front for patient pick up

## 2023-11-06 ENCOUNTER — Ambulatory Visit: Payer: Self-pay | Admitting: Internal Medicine

## 2023-11-08 ENCOUNTER — Other Ambulatory Visit: Payer: Self-pay

## 2023-11-09 ENCOUNTER — Other Ambulatory Visit: Payer: Self-pay | Admitting: Internal Medicine

## 2023-11-09 ENCOUNTER — Other Ambulatory Visit (HOSPITAL_COMMUNITY): Payer: Self-pay | Admitting: Psychiatry

## 2023-11-09 ENCOUNTER — Telehealth: Payer: Self-pay | Admitting: Internal Medicine

## 2023-11-09 ENCOUNTER — Other Ambulatory Visit: Payer: Self-pay

## 2023-11-09 ENCOUNTER — Ambulatory Visit (INDEPENDENT_AMBULATORY_CARE_PROVIDER_SITE_OTHER): Payer: 59 | Admitting: Psychology

## 2023-11-09 ENCOUNTER — Other Ambulatory Visit (HOSPITAL_COMMUNITY): Payer: Self-pay

## 2023-11-09 DIAGNOSIS — F4323 Adjustment disorder with mixed anxiety and depressed mood: Secondary | ICD-10-CM

## 2023-11-09 DIAGNOSIS — F4321 Adjustment disorder with depressed mood: Secondary | ICD-10-CM | POA: Diagnosis not present

## 2023-11-09 MED ORDER — ALPRAZOLAM 1 MG PO TABS
1.0000 mg | ORAL_TABLET | Freq: Three times a day (TID) | ORAL | 1 refills | Status: DC | PRN
  Filled 2023-11-09: qty 90, 30d supply, fill #0
  Filled 2023-11-17 – 2023-12-07 (×2): qty 90, 30d supply, fill #1
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
## 2023-11-09 NOTE — Telephone Encounter (Unsigned)
 Copied from CRM [id number]. Topic: Clinical - Medication Refill >> Nov 09, 2023  7:40 AM Zenovia PARAS wrote: Medication:  ALPRAZolam  (XANAX ) 1 MG tablet    Has the patient contacted their pharmacy? Yes (Agent: If no, request that the patient contact the pharmacy for the refill. If patient does not wish to contact the pharmacy document the reason why and proceed with request.) (Agent: If yes, when and what did the pharmacy advise?)  This is the patient's preferred pharmacy:  [facility] [address] [place] [address] Phone: [phone] Fax: [phone]   Is this the correct pharmacy for this prescription? Yes If no, delete pharmacy and type the correct one.   Has the prescription been filled recently? No  Is the patient out of the medication? No  Has the patient been seen for an appointment in the last year OR does the patient have an upcoming appointment? No  Can we respond through MyChart? No  Agent: Please be advised that Rx refills may take up to 3 business days. We ask that you follow-up with your pharmacy.

## 2023-11-10 ENCOUNTER — Other Ambulatory Visit (HOSPITAL_COMMUNITY): Payer: Self-pay

## 2023-11-10 ENCOUNTER — Ambulatory Visit (HOSPITAL_COMMUNITY)

## 2023-11-10 ENCOUNTER — Other Ambulatory Visit: Payer: Self-pay

## 2023-11-11 ENCOUNTER — Other Ambulatory Visit (HOSPITAL_BASED_OUTPATIENT_CLINIC_OR_DEPARTMENT_OTHER): Payer: Self-pay

## 2023-11-11 ENCOUNTER — Other Ambulatory Visit (HOSPITAL_COMMUNITY): Payer: Self-pay

## 2023-11-11 ENCOUNTER — Other Ambulatory Visit: Payer: Self-pay

## 2023-11-11 MED ORDER — AUVELITY 45-105 MG PO TBCR
EXTENDED_RELEASE_TABLET | ORAL | 0 refills | Status: DC
  Filled 2023-11-11: qty 30, 30d supply, fill #0
  Filled 2023-12-08: qty 60, 60d supply, fill #0
  Filled 2024-01-05 – 2024-01-15 (×2): qty 60, fill #0

## 2023-11-11 NOTE — Progress Notes (Unsigned)
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  Transthoracic echocardiography.  Image quality was adequate.  Study completion:  There were no  complications.          Transthoracic echocardiography.  M-mode,  complete 2D, spectral Doppler, and color Doppler.  Birthdate:  Patient birthdate: 22-Jan-1951.  Age:  Patient is 73 yr old.  Sex:  Gender: female.    BMI: 24 kg/m^2.  Blood pressure:     129/85  Patient status:  Inpatient.  Study date:  Study date: 10/15/2016.  Study time: 09:02 AM.  Location:  Bedside.   -------------------------------------------------------------------   -------------------------------------------------------------------  Left ventricle:  The cavity size was normal. Wall thickness was  normal. Systolic function was normal. The estimated ejection  fraction was in the range of 60% to 65%. Wall motion was normal;  there were no regional wall motion abnormalities. The transmitral  flow pattern was normal. The deceleration time of the early  transmitral flow velocity was normal. The pulmonary vein flow  pattern was normal. The tissue Doppler parameters were normal. Left  ventricular diastolic function parameters were  normal.   -------------------------------------------------------------------  Aortic valve:   Trileaflet.  Doppler:   There was no stenosis.  There was no regurgitation.   -------------------------------------------------------------------  Aorta: Aortic root: The aortic root was normal in size.  Ascending aorta: The ascending aorta was normal in size.   -------------------------------------------------------------------  Mitral valve:   Normal thickness leaflets .  Doppler:   There was  no evidence for stenosis.   There was trivial regurgitation.  Valve area by pressure half-time: 3.67 cm^2. Indexed valve area by  pressure half-time: 2.21 cm^2/m^2.    Peak gradient (D): 8 mm Hg.    -------------------------------------------------------------------  Left atrium:  The atrium was normal in size.   -------------------------------------------------------------------  Right ventricle:  The cavity size was normal. Systolic function was  normal.   -------------------------------------------------------------------  Pulmonic valve:    Structurally normal valve.   Cusp separation was  normal.  Doppler:  Transvalvular velocity was within the normal  range. There was trivial regurgitation.   -------------------------------------------------------------------  Tricuspid valve:   Doppler:  There was trivial regurgitation.    -------------------------------------------------------------------  Right atrium:  The atrium was normal in size.   -------------------------------------------------------------------  Pericardium: There was no pericardial effusion.   -------------------------------------------------------------------  Systemic veins:  Inferior vena cava: The vessel was normal in size. The  respirophasic diameter changes were in the normal range (= 50%),  consistent with normal central venous pressure.   Cardiac  catheterization 08/26/2017  Diagnostic Dominance:  Right  Intervention    Assessment & Plan   1.  Essential hypertension-BP today 146/98.  Recent lab work shows stable renal function and electrolytes.   Continue carvedilol , clonidine , losartan , hydralizine Increase Carvedilol  to 25 mg am and 12.5 mg pm Low-sodium diet-continue Avoid caffeine, NSAIDs, stress reduction, increase physical activity as tolerated Maintain blood pressure log  Hyperlipidemia-LDL 174 on 09/23/22.  High-fiber diet Continue aspirin , omega-3 fatty acids, rosuvastatin  Direct LDL  Syncope-no further episodes.  Denies recent episodes of lightheadedness, presyncope or syncope.  She previously wore cardiac event monitor 10/23.  Results unavailable. Maintain p.o. hydration Change positions slowly She may use lower extremity support stockings  Disposition: Follow-up with Dr. Sheena in 3 months.   Josefa HERO. Hidaya Daniel NP-C     11/14/2023, 3:26 PM [facility] [address] Office [phone] Fax [phone]    I spent 14 minutes examining this patient, reviewing medications, and using patient centered shared decision making involving their cardiac care.   I spent  20 minutes reviewing past medical history,  medications, and prior cardiac tests.

## 2023-11-12 ENCOUNTER — Other Ambulatory Visit (HOSPITAL_COMMUNITY): Payer: Self-pay | Admitting: Psychiatry

## 2023-11-14 ENCOUNTER — Other Ambulatory Visit (HOSPITAL_COMMUNITY): Payer: Self-pay

## 2023-11-14 ENCOUNTER — Other Ambulatory Visit (HOSPITAL_BASED_OUTPATIENT_CLINIC_OR_DEPARTMENT_OTHER): Payer: Self-pay

## 2023-11-14 ENCOUNTER — Ambulatory Visit: Attending: General Practice | Admitting: General Practice

## 2023-11-14 ENCOUNTER — Other Ambulatory Visit: Payer: Self-pay | Admitting: Internal Medicine

## 2023-11-14 ENCOUNTER — Encounter: Payer: Self-pay | Admitting: General Practice

## 2023-11-14 ENCOUNTER — Other Ambulatory Visit: Payer: Self-pay

## 2023-11-14 ENCOUNTER — Telehealth: Payer: Self-pay | Admitting: General Practice

## 2023-11-14 ENCOUNTER — Ambulatory Visit: Admitting: Psychology

## 2023-11-14 VITALS — BP 146/98 | HR 78 | Ht 60.0 in | Wt 170.0 lb

## 2023-11-14 DIAGNOSIS — Z87898 Personal history of other specified conditions: Secondary | ICD-10-CM | POA: Diagnosis not present

## 2023-11-14 DIAGNOSIS — E7849 Other hyperlipidemia: Secondary | ICD-10-CM | POA: Diagnosis not present

## 2023-11-14 DIAGNOSIS — I10 Essential (primary) hypertension: Secondary | ICD-10-CM

## 2023-11-14 MED ORDER — CARVEDILOL 12.5 MG PO TABS
12.5000 mg | ORAL_TABLET | Freq: Every evening | ORAL | 3 refills | Status: AC
  Filled 2023-11-14 – 2023-11-15 (×2): qty 30, 30d supply, fill #0
  Filled 2023-11-17 – 2023-12-08 (×3): qty 30, 30d supply, fill #1
  Filled 2024-01-05 – 2024-01-15 (×3): qty 30, 30d supply, fill #2

## 2023-11-14 MED ORDER — CARVEDILOL 25 MG PO TABS
25.0000 mg | ORAL_TABLET | Freq: Every morning | ORAL | 3 refills | Status: DC
  Filled 2023-11-14 – 2023-11-15 (×2): qty 30, 30d supply, fill #0
  Filled 2023-11-17 – 2023-12-08 (×3): qty 30, 30d supply, fill #1
  Filled 2024-01-05 – 2024-01-15 (×3): qty 30, 30d supply, fill #2

## 2023-11-14 MED ORDER — AUVELITY 45-105 MG PO TBCR
1.0000 | EXTENDED_RELEASE_TABLET | Freq: Every day | ORAL | 1 refills | Status: DC
  Filled 2023-11-14 – 2023-12-08 (×3): qty 60, 60d supply, fill #0
  Filled 2023-12-20: qty 30, 30d supply, fill #0
  Filled 2023-12-21 – 2023-12-30 (×2): qty 60, 60d supply, fill #0
  Filled 2024-01-05 – 2024-01-15 (×2): qty 30, 30d supply, fill #0

## 2023-11-14 MED ORDER — CARVEDILOL 25 MG PO TABS
ORAL_TABLET | ORAL | 3 refills | Status: DC
  Filled 2023-11-14: qty 60, fill #0

## 2023-11-14 NOTE — Patient Instructions (Signed)
 Medication Instructions:  Increase your morning Carvedilol  dose to 25 mg in the morning and keep the evening dose 12.5 mg   *If you need a refill on your cardiac medications before your next appointment, please call your pharmacy*  Lab Work: To be completed today: direct LDL If you have labs (blood work) drawn today and your tests are completely normal, you will receive your results only by: MyChart Message (if you have MyChart) OR A paper copy in the mail If you have any lab test that is abnormal or we need to change your treatment, we will call you to review the results.  Testing/Procedures: None ordered today.  Follow-Up: At [facility], you and your health needs are our priority.  As part of our continuing mission to provide you with exceptional heart care, our providers are all part of one team.  This team includes your primary Cardiologist (physician) and Advanced Practice Providers or APPs (Physician Assistants and Nurse Practitioners) who all work together to provide you with the care you need, when you need it.  Your next appointment:   3 month(s)  Provider:   Kardie Tobb, DO or Josefa Beauvais, NP   We recommend signing up for the patient portal called MyChart.  Sign up information is provided on this After Visit Summary.  MyChart is used to connect with patients for Virtual Visits (Telemedicine).  Patients are able to view lab/test results, encounter notes, upcoming appointments, etc.  Non-urgent messages can be sent to your provider as well.   To learn more about what you can do with MyChart, go to [email or web address].   Other Instructions Increase physical activity as tolerated.

## 2023-11-14 NOTE — Telephone Encounter (Signed)
 Spoke with Dorise, pharmacist and advised Carvedilol  sent as requested for packaging purposes.  Dorise confer RN for the call.

## 2023-11-14 NOTE — Telephone Encounter (Signed)
 Pt c/o medication issue:  1. Name of Medication: carvedilol  (COREG ) 25 MG tablet   2. How are you currently taking this medication (dosage and times per day)? As written  3. Are you having a reaction (difficulty breathing--STAT)? No   4. What is your medication issue? Pt gets her medication in adherence packing. So pharmacy is asking that 2 scripts be put in   One for Carvedilol  25mg  in morning And one for Carvedilol  12.5 in the evening

## 2023-11-15 ENCOUNTER — Other Ambulatory Visit (HOSPITAL_COMMUNITY): Payer: Self-pay

## 2023-11-15 ENCOUNTER — Ambulatory Visit: Payer: Self-pay | Admitting: General Practice

## 2023-11-15 ENCOUNTER — Other Ambulatory Visit (HOSPITAL_COMMUNITY): Payer: Self-pay | Admitting: Psychiatry

## 2023-11-15 ENCOUNTER — Other Ambulatory Visit: Payer: Self-pay

## 2023-11-15 LAB — LDL CHOLESTEROL, DIRECT: LDL Direct: 64 mg/dL (ref 0–99)

## 2023-11-16 ENCOUNTER — Ambulatory Visit: Payer: Self-pay
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
## 2023-11-16 NOTE — Patient Instructions (Addendum)
 Visit Information  Thank you for taking time to visit with me today. Please don't hesitate to contact me if I can be of assistance to you before our next scheduled appointment.  Our next appointment is by telephone on 12/15/23 at 3:00 pm Please call the care guide team at [phone] if you need to cancel or reschedule your appointment.   Following is a copy of your care plan:   Goals Addressed             This Visit's Progress    VBCI RN Care Plan       Problems:  Chronic Disease Management support and education needs related to Fibromyalgia Lacks caregiver support. Level of care needs reports has toured [facility] skilled facility and has been accepted. Patient reports she is on the waiting list. Patient reports son is a Engineer, civil (consulting) in [facility], who is supportive but limited ability to assist  Goal: Over the next 90days the Patient will verbalize transition to higher level of care Patient to verbalize fall prevention strategies Patient to attend provider appointments as scheduled.  Interventions:   Evaluation of current treatment plan related to fibromyalgia and level of care needs, Level of care concerns self-management and patient's adherence to plan as established by provider. Discussed plans with patient for ongoing care management follow up and provided patient with direct contact information for care management team Advised patient to continue to work with LCSW regarding assistance with higher level of care Collaborated with LCSW today regarding patient needs and plan of care regarding patient status and care needsConfirmed patient has spoken with clinical pharmacist and has no questions.   Active Listening and support provided.  Patient Self-Care Activities:  Attend all scheduled provider appointments Take medications as prescribed   Work with the social worker to address care coordination needs and will continue to work with the clinical team to address health care and  disease management related needs Work with the pharmacist to address medication management needs and will continue to work with the clinical team to address health care and disease management related needs Continue fall prevention strategies Active listening. Provided positive feedback regarding self care management.   Plan:  Telephone follow up appointment with care management team member scheduled for:  12/15/23 at 3:00 pm           Please call the Suicide and Crisis Lifeline: 988 call the [place]  National Suicide Prevention Lifeline: 817-293-8887 or TTY: 949-042-5797 TTY 718-126-1355) to talk to a trained counselor if you are experiencing a Mental Health or Behavioral Health Crisis or need someone to talk to.  Patient verbalizes understanding of instructions and care plan provided today and agrees to view in MyChart. Active MyChart status and patient understanding of how to access instructions and care plan via MyChart confirmed with patient.     Heddy Shutter, RN, MSN, BSN, CCM [place]  [facility], Population Health Case Manager Phone: [phone]

## 2023-11-16 NOTE — Telephone Encounter (Signed)
 FYI Only or Action Required?: Action required by provider: request for appointment.  Patient is followed in Pulmonology for New Patient, last seen on New Patient. Called Nurse Triage reporting No chief complaint on file.. Symptoms began several years ago. Interventions attempted: Nothing. Symptoms are: gradually worsening.  Triage Disposition: See PCP When Office is Open (Within 3 Days)  Patient/caregiver understands and will follow disposition?: Yes  Copied from CRM [id number]. Topic: Clinical - Red Word Triage >> Nov 16, 2023  2:06 PM [name] wrote: Red Word that prompted transfer to Nurse Triage: shortness of breath asthma and copd Reason for Disposition  [1] MODERATE longstanding difficulty breathing (e.g., speaks in phrases, SOB even at rest, pulse 100-120) AND [2] SAME as normal  Answer Assessment - Initial Assessment Questions 1. RESPIRATORY STATUS: Describe your breathing? (e.g., wheezing, shortness of breath, unable to speak, severe coughing)      Dyspnea  2. ONSET: When did this breathing problem begin?      Chronic  3. PATTERN Does the difficult breathing come and go, or has it been constant since it started?      Intermittent  4. SEVERITY: How bad is your breathing? (e.g., mild, moderate, severe)    - MILD: No SOB at rest, mild SOB with walking, speaks normally in sentences, can lie down, no retractions, pulse < 100.    - MODERATE: SOB at rest, SOB with minimal exertion and prefers to sit, cannot lie down flat, speaks in phrases, mild retractions, audible wheezing, pulse 100-120.    - SEVERE: Very SOB at rest, speaks in single words, struggling to breathe, sitting hunched forward, retractions, pulse > 120      Mild  5. RECURRENT SYMPTOM: Have you had difficulty breathing before? If Yes, ask: When was the last time? and What happened that time?      Yes  6. CARDIAC HISTORY: Do you have any history of heart disease? (e.g., heart attack, angina, bypass surgery,  angioplasty)      Hypertension  7. LUNG HISTORY: Do you have any history of lung disease?  (e.g., pulmonary embolus, asthma, emphysema)     Bronchitis, COPD  8. CAUSE: What do you think is causing the breathing problem?      Unsure  9. OTHER SYMPTOMS: Do you have any other symptoms? (e.g., dizziness, runny nose, cough, chest pain, fever)     Runny Nose, Congestion  10. O2 SATURATION MONITOR:  Do you use an oxygen saturation monitor (pulse oximeter) at home? If Yes, ask: What is your reading (oxygen level) today? What is your usual oxygen saturation reading? (e.g., 95%)       Unsure  11. PREGNANCY: Is there any chance you are pregnant? When was your last menstrual period?       No and No  12. TRAVEL: Have you traveled out of the country in the last month? (e.g., travel history, exposures)       No  Protocols used: Breathing Difficulty-A-AH

## 2023-11-17 ENCOUNTER — Ambulatory Visit: Admitting: Emergency Medicine

## 2023-11-17 ENCOUNTER — Other Ambulatory Visit: Payer: Self-pay

## 2023-11-19 ENCOUNTER — Other Ambulatory Visit (HOSPITAL_COMMUNITY): Payer: Self-pay

## 2023-11-21 ENCOUNTER — Ambulatory Visit: Admitting: Internal Medicine

## 2023-11-22 ENCOUNTER — Other Ambulatory Visit (HOSPITAL_COMMUNITY): Payer: Self-pay

## 2023-11-23 ENCOUNTER — Ambulatory Visit (HOSPITAL_COMMUNITY)
Admission: RE | Admit: 2023-11-23 | Discharge: 2023-11-23 | Disposition: A | Discharge status: Home / Self Care | Source: Ambulatory Visit | Attending: Gynecologic Oncology | Admitting: Gynecologic Oncology

## 2023-11-23 ENCOUNTER — Ambulatory Visit: Admitting: Psychology

## 2023-11-23 DIAGNOSIS — K760 Fatty (change of) liver, not elsewhere classified: Secondary | ICD-10-CM | POA: Diagnosis not present

## 2023-11-23 DIAGNOSIS — I700 Atherosclerosis of aorta: Secondary | ICD-10-CM | POA: Diagnosis not present

## 2023-11-23 DIAGNOSIS — C55 Malignant neoplasm of uterus, part unspecified: Secondary | ICD-10-CM | POA: Insufficient documentation

## 2023-11-23 LAB — POCT I-STAT CREATININE: Creatinine, Ser: 1.3 mg/dL — ABNORMAL HIGH (ref 0.44–1.00)

## 2023-11-23 MED ORDER — IOHEXOL 300 MG/ML  SOLN
100.0000 mL | Freq: Once | INTRAMUSCULAR | Status: AC | PRN
  Administered 2023-11-23: 80 mL via INTRAVENOUS

## 2023-11-23 MED ORDER — IOHEXOL 9 MG/ML PO SOLN
1000.0000 mL | ORAL | Status: AC
  Administered 2023-11-23: 1000 mL via ORAL

## 2023-11-23 MED ORDER — IOHEXOL 9 MG/ML PO SOLN
ORAL | Status: AC
  Filled 2023-11-23: qty 1000

## 2023-11-26 ENCOUNTER — Ambulatory Visit: Payer: Self-pay | Admitting: Gynecologic Oncology

## 2023-11-28 ENCOUNTER — Ambulatory Visit (INDEPENDENT_AMBULATORY_CARE_PROVIDER_SITE_OTHER): Admitting: Psychology

## 2023-11-28 DIAGNOSIS — F4323 Adjustment disorder with mixed anxiety and depressed mood: Secondary | ICD-10-CM | POA: Diagnosis not present
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
## 2023-12-01 ENCOUNTER — Other Ambulatory Visit: Payer: Self-pay

## 2023-12-01 ENCOUNTER — Ambulatory Visit: Admitting: Internal Medicine

## 2023-12-02 ENCOUNTER — Other Ambulatory Visit: Payer: Self-pay

## 2023-12-07 ENCOUNTER — Ambulatory Visit (INDEPENDENT_AMBULATORY_CARE_PROVIDER_SITE_OTHER): Admitting: Psychology

## 2023-12-07 ENCOUNTER — Other Ambulatory Visit (HOSPITAL_COMMUNITY): Payer: Self-pay

## 2023-12-07 DIAGNOSIS — F4323 Adjustment disorder with mixed anxiety and depressed mood: Secondary | ICD-10-CM | POA: Diagnosis not present
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
## 2023-12-08 ENCOUNTER — Other Ambulatory Visit: Payer: Self-pay

## 2023-12-08 ENCOUNTER — Other Ambulatory Visit (HOSPITAL_COMMUNITY): Payer: Self-pay

## 2023-12-08 ENCOUNTER — Other Ambulatory Visit: Payer: Self-pay | Admitting: Internal Medicine

## 2023-12-09 ENCOUNTER — Other Ambulatory Visit: Payer: Self-pay | Admitting: Internal Medicine

## 2023-12-09 ENCOUNTER — Other Ambulatory Visit (HOSPITAL_COMMUNITY): Payer: Self-pay

## 2023-12-09 ENCOUNTER — Other Ambulatory Visit: Payer: Self-pay

## 2023-12-09 MED ORDER — COLESEVELAM HCL 625 MG PO TABS
1250.0000 mg | ORAL_TABLET | Freq: Two times a day (BID) | ORAL | 3 refills | Status: AC
  Filled 2023-12-09: qty 120, 30d supply, fill #0
  Filled 2024-01-05 – 2024-01-15 (×3): qty 120, 30d supply, fill #1

## 2023-12-12 ENCOUNTER — Ambulatory Visit: Admitting: Psychology

## 2023-12-12 ENCOUNTER — Other Ambulatory Visit: Payer: Self-pay

## 2023-12-13 ENCOUNTER — Other Ambulatory Visit: Payer: Self-pay

## 2023-12-14 ENCOUNTER — Other Ambulatory Visit: Payer: Self-pay

## 2023-12-15 ENCOUNTER — Telehealth: Payer: Self-pay

## 2023-12-15 NOTE — Patient Instructions (Signed)
 [name] - I am sorry I was unable to reach you today for our scheduled appointment. I work with Plotnikov, [name], MD and am calling to support your healthcare needs. Please contact me at [phone] at your earliest convenience. I look forward to speaking with you soon.   Thank you,   Heddy Shutter, RN, MSN, BSN, CCM [address]  [facility], Population Health Case Manager Phone: [phone]

## 2023-12-19 ENCOUNTER — Other Ambulatory Visit: Payer: Self-pay

## 2023-12-19 ENCOUNTER — Other Ambulatory Visit: Payer: Self-pay | Admitting: Internal Medicine

## 2023-12-19 ENCOUNTER — Telehealth: Payer: Self-pay | Admitting: Internal Medicine

## 2023-12-19 NOTE — Telephone Encounter (Signed)
 FL2 form received via fax and placed in provider's box up front.

## 2023-12-20 ENCOUNTER — Ambulatory Visit: Admitting: Internal Medicine

## 2023-12-20 ENCOUNTER — Other Ambulatory Visit: Payer: Self-pay

## 2023-12-20 ENCOUNTER — Other Ambulatory Visit (HOSPITAL_COMMUNITY): Payer: Self-pay

## 2023-12-20 MED ORDER — ASPIRIN 81 MG PO TBEC
81.0000 mg | DELAYED_RELEASE_TABLET | Freq: Every day | ORAL | 3 refills | Status: AC
  Filled 2024-01-05 – 2024-01-15 (×3): qty 30, 30d supply, fill #0

## 2023-12-21 ENCOUNTER — Ambulatory Visit (INDEPENDENT_AMBULATORY_CARE_PROVIDER_SITE_OTHER): Admitting: Psychology

## 2023-12-21 ENCOUNTER — Other Ambulatory Visit: Payer: Self-pay

## 2023-12-21 DIAGNOSIS — F4323 Adjustment disorder with mixed anxiety and depressed mood: Secondary | ICD-10-CM | POA: Diagnosis not present

## 2023-12-21 NOTE — Telephone Encounter (Signed)
 Patient called and provided fax number for the paperwork to be sent to: [phone], Attention [place].

## 2023-12-21 NOTE — Progress Notes (Signed)
 JYSSICA RIEF is a 73 y.o. 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  Symptoms A diagnosis of an acute, serious illness that is life-threatening. (Status: maintained) -- No Description Entered  Diminished interest in or enjoyment of activities. (Status: maintained) -- No Description Entered  Lack of energy. (Status: maintained) -- No Description Entered  Sad affect, social withdrawal, anxiety, loss of interest in activities, and low energy. (Status: maintained) -- No Description Entered  Medication Status compliance  Safety unspecified If Suicidal or Homicidal State Action Taken: unspecified  Current Risk:  Medications Buspar  (Dosage: 30mg  2X/day)  New Medication (Dosage: 250mg  2x/day)  New Medication (Dosage: 250mg  2x/day)  Prozac  (Dosage: 40mg  daily)  Remeron  (Dosage: 30mg  nightly)  xanax  (Dosage: .5mg  2-3X/day)  Objectives Related Problem: Recognize, accept, and cope with feelings of depression. Description: Identify and replace thoughts and beliefs that support depression. Target Date: 2024-04-28 Frequency: Daily Modality: individual Progress: 80%  Related Problem: Recognize, accept, and cope with feelings of depression. Description: Verbalize an understanding of healthy and unhealthy emotions with the intent of increasing the use of healthy emotions to guide actions. Target Date: 2024-04-28 Frequency: Daily Modality: individual Progress: 85%  Related Problem: Recognize, accept, and cope with feelings of depression. Description: Increasingly verbalize hopeful and positive statements regarding self, others, and the future. Target Date: 2022-04-28 Frequency: Daily Modality: individual Progress: 100%  Related Problem: Reduce fear, anxiety, and worry associated with the medical condition. Description: Share with significant others efforts to adapt successfully to  the medical condition/diagnosis. Target Date: 2024-04-28 Frequency: Daily Modality: individual Progress: 85%  Related Problem: Reduce fear, anxiety, and worry associated with the medical condition. Description: Engage in social, productive, and recreational activities that are possible in spite of medical condition. Target Date: 2024-04-28 Frequency: Daily Modality: individual Progress: 90%  Related Problem: Reduce fear, anxiety, and worry associated with the medical condition. Description: Identify the coping skills and sources of emotional support that have been beneficial in the past. Target Date: 2024-04-28 Frequency: Daily Modality: individual Progress: 90%  Client Response full compliance  Service Location Location, [address] [place], [place] [address]  Service Code cpt [id number]  Related past to present  Facilitate problem solving  Identify/label emotions  Validate/empathize  Self care activities  Lifestyle change (exercise, nutrition)  Assess/facilitate readiness to change  Relaxation training  Rationally challenge thoughts or beliefs/cognitive restructuring  Comments  Dx: F43.21  Meds: Prozac , Buspar  and Remeron .  Goals: Following the failure of a third marriage, patient is struggling to rebuild her life and adjust to her new circumstances. She suffers a negative self-concept and has little confidence in her judgement. She is seeking to reduce symptoms of anxiety and self-doubt and to re-define satisfa ction moving forward. Therapy will focus on cognitive restructuring to redefine her experiences and focus on those satisfying aspects of her life. Will target this goal completion for April1, 2021 (completed). She is also wanting to utilize counseling to adjust and accept her latest diagnosis of cancer. She wants assistance to better manage this news and remain positive throughout treatment. Goal date is 12-22. Patient has made progress, [name], she is trying to manage feelings associated with her older son's mental health struggles. Revised goal date is 12-25.   Patient agrees to a video  session and is aware of the limitations of this platform. She is at home and I am in home office.   [name]: Evangelyne Loja heard from [facility] nursing home, and they have an available bed. She ishoping to make the move a week from today. It is a shared room with 2 single beds. She says she is apprehensive because this is a significant change and she cannot take anything with her other than her clothes. She says she is falling 1-2 times/day. Also says her vision is blurred and she has GI problems. She says I'll be there until I die. She says she is fatigued all the time. We discussed strategies to best adjust to her new circumstances.                                                                                                                                                                                                                                                                                                                        Exzavier Ruderman LEWIS Time: 3:15p-4:00p 45 minutes

## 2023-12-23 NOTE — Telephone Encounter (Signed)
 New FL2 has been filled out and placed on PCP desk for review and signature. Once returned I will fax to number provided.

## 2023-12-26 ENCOUNTER — Ambulatory Visit (INDEPENDENT_AMBULATORY_CARE_PROVIDER_SITE_OTHER): Admitting: Psychology

## 2023-12-26 DIAGNOSIS — F4323 Adjustment disorder with mixed anxiety and depressed mood: Secondary | ICD-10-CM | POA: Diagnosis not present

## 2023-12-26 NOTE — Progress Notes (Signed)
 [name] is a 73 y.o. 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  Symptoms A diagnosis of an acute, serious illness that is life-threatening. (Status: maintained) -- No Description Entered  Diminished interest in or enjoyment of activities. (Status: maintained) -- No Description Entered  Lack of energy. (Status: maintained) -- No Description Entered  Sad affect, social withdrawal, anxiety, loss of interest in activities, and low energy. (Status: maintained) -- No Description Entered  Medication Status compliance  Safety unspecified If Suicidal or Homicidal State Action Taken: unspecified  Current Risk:  Medications Buspar  (Dosage: 30mg  2X/day)  New Medication (Dosage: 250mg  2x/day)  New Medication (Dosage: 250mg  2x/day)  Prozac  (Dosage: 40mg  daily)  Remeron  (Dosage: 30mg  nightly)  xanax  (Dosage: .5mg  2-3X/day)  Objectives Related Problem: Recognize, accept, and cope with feelings of depression. Description: Identify and replace thoughts and beliefs that support depression. Target Date: 2024-04-28 Frequency: Daily Modality: individual Progress: 80%  Related Problem: Recognize, accept, and cope with feelings of depression. Description: Verbalize an understanding of healthy and unhealthy emotions with the intent of increasing the use of healthy emotions to guide actions. Target Date: 2024-04-28 Frequency: Daily Modality: individual Progress: 85%  Related Problem: Recognize, accept, and cope with feelings of depression. Description: Increasingly verbalize hopeful and positive statements regarding self, others, and the future. Target Date: 2022-04-28 Frequency: Daily Modality: individual Progress: 100%  Related Problem: Reduce fear, anxiety, and worry associated with the medical condition. Description: Share with significant others  efforts to adapt successfully to the medical condition/diagnosis. Target Date: 2024-04-28 Frequency: Daily Modality: individual Progress: 85%  Related Problem: Reduce fear, anxiety, and worry associated with the medical condition. Description: Engage in social, productive, and recreational activities that are possible in spite of medical condition. Target Date: 2024-04-28 Frequency: Daily Modality: individual Progress: 90%  Related Problem: Reduce fear, anxiety, and worry associated with the medical condition. Description: Identify the coping skills and sources of emotional support that have been beneficial in the past. Target Date: 2024-04-28 Frequency: Daily Modality: individual Progress: 90%  Client Response full compliance  Service Location Location, [address] [place], [place] [address]  Service Code cpt [phone]  Related past to present  Facilitate problem solving  Identify/label emotions  Validate/empathize  Self care activities  Lifestyle change (exercise, nutrition)  Assess/facilitate readiness to change  Relaxation training  Rationally challenge thoughts or beliefs/cognitive restructuring  Comments  Dx: F43.21  Meds: Prozac , Buspar  and Remeron .  Goals: Following the failure of a third marriage, patient is struggling to rebuild her life and adjust to her new circumstances. She suffers a negative self-concept and has little confidence in her judgement. She is seeking to reduce symptoms of anxiety and self-doubt and to re-define satisfa ction moving forward. Therapy will focus on cognitive restructuring to redefine her experiences and focus on those satisfying aspects of her life. Will target this goal completion for April1, 2021 (completed). She is also wanting to utilize counseling to adjust and accept her latest diagnosis of cancer. She wants assistance to better manage this news and remain positive throughout treatment. Goal date is 12-22. Patient has made  progress, but her medical condition is variable and she requests additional treatment to manage moods throughout her medical ordeal. In addition, she is trying to manage feelings associated with her older son's  mental health struggles. Revised goal date is 12-25.   Patient agrees to a video session and is aware of the limitations of this platform. She is at home and I am in home office.   [name]: [name] says that have been delays with her acceptance to the nursing facility. She is working on it. Says that her physical decline has been significant. Also thinks her cognitive functioning has declined significantly. She anticipates that her process will be similar to that which her father experienced. She is hoping, however, that she can have the disposition of her mother, which is that she is sweet and calm. Her father was much more agitated and difficult to be around. [name] holds on to her gratitude for a very good life and family, with her only distress being that it has all gone too fast. [name] talked about her strong religious beliefs and how that has helped her through her more difficult times. She gets caught up in the details of biblical stories. She recognizes the importance of maintaining gratitude.                                                                                                                                                                                                                                                                                                                         [name] Time: 4:15p-5:00p 45 minutes

## 2023-12-26 NOTE — Telephone Encounter (Signed)
 Patient would like a couple extra copies of the [facility] paperwork for her to pick up when completed. Best callback  is [phone].

## 2023-12-27 ENCOUNTER — Ambulatory Visit: Admitting: Internal Medicine

## 2023-12-27 ENCOUNTER — Telehealth: Payer: Self-pay

## 2023-12-27 NOTE — Patient Instructions (Signed)
 [name] - I am sorry I was unable to reach you today for our scheduled appointment. I work with Plotnikov, [name], MD and am calling to support your healthcare needs. Please contact me at [phone] at your earliest convenience. I look forward to speaking with you soon.   Thank you,   Heddy Shutter, RN, MSN, BSN, CCM [address]  [facility], Population Health Case Manager Phone: [phone]

## 2023-12-29 ENCOUNTER — Other Ambulatory Visit: Payer: Self-pay

## 2023-12-29 ENCOUNTER — Other Ambulatory Visit (HOSPITAL_COMMUNITY): Payer: Self-pay

## 2023-12-29 ENCOUNTER — Ambulatory Visit: Admitting: Gynecologic Oncology

## 2023-12-29 ENCOUNTER — Other Ambulatory Visit: Payer: Self-pay | Admitting: Internal Medicine

## 2023-12-29 NOTE — Telephone Encounter (Signed)
 Copied from CRM [id number]. Topic: General - Other >> Dec 29, 2023 11:46 AM Carlyon D wrote: Reason for CRM: Pt calling in regards to [facility] form she needs provider to sign for her to be admitted to nursing home. Pt states this has been over 3 months now that no one has gotten this take care of for her.  Reached out to [facility]. Stated nurses were busy, Pt is pretty upset she has been waiting 3 months. Pt been calling every day  Please call pt today in regards to this.

## 2023-12-30 ENCOUNTER — Other Ambulatory Visit: Payer: Self-pay | Admitting: Internal Medicine

## 2023-12-30 ENCOUNTER — Encounter: Payer: Self-pay | Admitting: Pharmacist

## 2023-12-30 ENCOUNTER — Telehealth: Payer: Self-pay

## 2023-12-30 ENCOUNTER — Other Ambulatory Visit (HOSPITAL_COMMUNITY): Payer: Self-pay

## 2023-12-30 ENCOUNTER — Other Ambulatory Visit (HOSPITAL_BASED_OUTPATIENT_CLINIC_OR_DEPARTMENT_OTHER): Payer: Self-pay

## 2023-12-30 ENCOUNTER — Other Ambulatory Visit: Payer: Self-pay

## 2023-12-30 MED ORDER — AUVELITY 45-105 MG PO TBCR
2.0000 | EXTENDED_RELEASE_TABLET | Freq: Every morning | ORAL | 1 refills | Status: DC
  Filled 2023-12-30 (×2): qty 60, 30d supply, fill #0
  Filled 2024-01-09: qty 60, 30d supply, fill #1

## 2023-12-30 NOTE — Telephone Encounter (Signed)
 Pt has had several FL2 forms filled, reviewed and signed over the past 3 mnths. Most recent FL2 form was signed and faxed to recent nursing home of pt preference. Pt was informed of this as well.

## 2023-12-30 NOTE — Telephone Encounter (Signed)
 Copied from CRM #[id number]. Topic: Clinical - Medication Question >> Dec 30, 2023 10:03 AM Suzen RAMAN wrote: Reason for CRM: Norleen from [facility] pharmacy would like a call back pertaining to patient medication Dextromethorphan -buPROPion  ER (AUVELITY ) 45-105 MG TBCR .Patient is currently of medication and pharmacy needs clarification before dispensing medication. The medication has a strict dispense of 60 but script is only written for 30 due to patient insurance which will only cover 30. Pharmacy is unable to modify dispense and would like a call back to further discuss    CB#[phone] call through normal non provider line not provider line which will go to the store to a different individual.

## 2024-01-01 MED ORDER — ALPRAZOLAM 1 MG PO TABS
1.0000 mg | ORAL_TABLET | Freq: Three times a day (TID) | ORAL | 1 refills | Status: DC | PRN
  Filled 2024-01-01: qty 90, 30d supply, fill #0
  Filled 2024-01-05: qty 90, 30d supply, fill #1
  Filled 2024-01-05: qty 90, 30d supply, fill #0

## 2024-01-02 ENCOUNTER — Other Ambulatory Visit (HOSPITAL_COMMUNITY): Payer: Self-pay

## 2024-01-03 ENCOUNTER — Ambulatory Visit (INDEPENDENT_AMBULATORY_CARE_PROVIDER_SITE_OTHER): Admitting: Internal Medicine

## 2024-01-03 VITALS — BP 128/84 | HR 75 | Temp 97.6°F | Ht 60.0 in

## 2024-01-03 DIAGNOSIS — R410 Disorientation, unspecified: Secondary | ICD-10-CM | POA: Diagnosis not present

## 2024-01-03 DIAGNOSIS — N1832 Chronic kidney disease, stage 3b: Secondary | ICD-10-CM | POA: Diagnosis not present

## 2024-01-03 DIAGNOSIS — F4322 Adjustment disorder with anxiety: Secondary | ICD-10-CM | POA: Diagnosis not present

## 2024-01-03 DIAGNOSIS — E559 Vitamin D deficiency, unspecified: Secondary | ICD-10-CM

## 2024-01-03 DIAGNOSIS — F314 Bipolar disorder, current episode depressed, severe, without psychotic features: Secondary | ICD-10-CM | POA: Diagnosis not present

## 2024-01-03 DIAGNOSIS — F0781 Postconcussional syndrome: Secondary | ICD-10-CM

## 2024-01-03 DIAGNOSIS — C499 Malignant neoplasm of connective and soft tissue, unspecified: Secondary | ICD-10-CM

## 2024-01-03 LAB — COMPREHENSIVE METABOLIC PANEL WITH GFR
ALT: 14 U/L (ref 0–35)
AST: 16 U/L (ref 0–37)
Albumin: 4.4 g/dL (ref 3.5–5.2)
Alkaline Phosphatase: 94 U/L (ref 39–117)
BUN: 32 mg/dL — ABNORMAL HIGH (ref 6–23)
CO2: 24 meq/L (ref 19–32)
Calcium: 9.1 mg/dL (ref 8.4–10.5)
Chloride: 104 meq/L (ref 96–112)
Creatinine, Ser: 1.27 mg/dL — ABNORMAL HIGH (ref 0.40–1.20)
GFR: 41.82 mL/min — ABNORMAL LOW (ref >60.00)
Glucose, Bld: 100 mg/dL — ABNORMAL HIGH (ref 70–99)
Potassium: 4.4 meq/L (ref 3.5–5.1)
Sodium: 137 meq/L (ref 135–145)
Total Bilirubin: 0.4 mg/dL (ref 0.2–1.2)
Total Protein: 7.4 g/dL (ref 6.0–8.3)

## 2024-01-03 LAB — CBC WITH DIFFERENTIAL/PLATELET
Basophils Absolute: 0.1 K/uL (ref 0.0–0.1)
Basophils Relative: 1.1 % (ref 0.0–3.0)
Eosinophils Absolute: 0.3 K/uL (ref 0.0–0.7)
Eosinophils Relative: 4 % (ref 0.0–5.0)
HCT: 36.6 % (ref 36.0–46.0)
Hemoglobin: 12.4 g/dL (ref 12.0–15.0)
Lymphocytes Relative: 15.5 % (ref 12.0–46.0)
Lymphs Abs: 1.1 K/uL (ref 0.7–4.0)
MCHC: 33.9 g/dL (ref 30.0–36.0)
MCV: 91.7 fl (ref 78.0–100.0)
Monocytes Absolute: 0.5 K/uL (ref 0.1–1.0)
Monocytes Relative: 6.3 % (ref 3.0–12.0)
Neutro Abs: 5.2 K/uL (ref 1.4–7.7)
Neutrophils Relative %: 73.1 % (ref 43.0–77.0)
Platelets: 353 K/uL (ref 150.0–400.0)
RBC: 3.99 Mil/uL (ref 3.87–5.11)
RDW: 13.4 % (ref 11.5–15.5)
WBC: 7.2 K/uL (ref 4.0–10.5)

## 2024-01-03 LAB — TSH: TSH: 3.9 u[IU]/mL (ref 0.35–5.50)

## 2024-01-03 LAB — HEMOGLOBIN A1C: Hgb A1c MFr Bld: 6 % (ref 4.6–6.5)

## 2024-01-03 NOTE — Telephone Encounter (Signed)
 Pt will be seen today by PCP nad I have attached her 5 copies of the MOST recent and FINAL FL2 form that has been faxed over TWICE to facility as requested [facility].

## 2024-01-03 NOTE — Assessment & Plan Note (Signed)
 Dr Levie Ream would like patient to stop welbutrin and prozac . Continue remeron . Starting Auvelity.

## 2024-01-03 NOTE — Telephone Encounter (Addendum)
 It has been addressed by Dr Tasia who prescribed this medication.  Thank you

## 2024-01-03 NOTE — Progress Notes (Signed)
 Subjective:  Patient ID: [name], female    DOB: 09-17-1950  Age: 73 y.o. MRN: [id number]  CC: Medical Management of Chronic Issues (FL2 completed forms given (5) total)   HPI MARQUE BANGO presents for FTT, falls, memory loss She is here w/her son [name] who is RN Teacher, adult education in [place]   Outpatient Medications Prior to Visit  Medication Sig Dispense Refill   acetaminophen  (TYLENOL ) 500 MG tablet Take 1,000 mg by mouth at bedtime.     ALPRAZolam  (XANAX ) 1 MG tablet Take 1 tablet (1 mg total) by mouth 3 (three) times daily as needed for anxiety. 90 tablet 1   ascorbic acid  (VITAMIN C ) 500 MG tablet Take 1 tablet (500 mg total) by mouth daily. 100 tablet 3   aspirin  EC (ASPIRIN  LOW DOSE) 81 MG tablet Take 1 tablet (81 mg total) by mouth daily. Swallow whole. 30 tablet 3   busPIRone  (BUSPAR ) 30 MG tablet Take 1 tablet (30 mg total) by mouth 2 (two) times daily. 60 tablet 3   carvedilol  (COREG ) 12.5 MG tablet Take 1 tablet (12.5 mg total) by mouth every evening. 90 tablet 3   carvedilol  (COREG ) 25 MG tablet Take 1 tablet (25 mg total) by mouth in the morning. 90 tablet 3   Cholecalciferol  (D3 5000) 125 MCG (5000 UT) capsule Take 1 capsule (5,000 Units total) by mouth daily. 100 capsule 3   cloNIDine  (CATAPRES ) 0.2 MG tablet Take 1 tablet (0.2 mg total) by mouth at bedtime. 90 tablet 3   colesevelam  (WELCHOL ) 625 MG tablet Take 2 tablets (1,250 mg total) by mouth 2 (two) times daily with a meal. 360 tablet 3   cyanocobalamin  (VITAMIN B12) 1000 MCG tablet Take 1 tablet (1,000 mcg total) by mouth daily. Take 1,000 mcg by mouth daily. 100 tablet 3   D-Mannose 500 MG CAPS Take 500 mg by mouth 2 (two) times daily. WITH CRANBERRY & DANDELION EXTRACT 180 capsule 3   Dextromethorphan -buPROPion  ER (AUVELITY ) 45-105 MG TBCR Take 1 tablet by mouth in the morning 60 tablet 0   Dextromethorphan -buPROPion  ER (AUVELITY ) 45-105 MG TBCR Take 1 tablet by mouth daily. Patient reports  prescribed by Dr. Tasia and approved by Dr. Ariela Mochizuki 30 tablet 1   Dextromethorphan -buPROPion  ER (AUVELITY ) 45-105 MG TBCR Take 2 tablets by mouth every morning. 60 tablet 1   esomeprazole  (NEXIUM ) 40 MG capsule Take 1 capsule (40 mg total) by mouth every morning. 90 capsule 3   hydrALAZINE  (APRESOLINE ) 10 MG tablet Take 1 tablet by mouth 3 times daily. MAY TAKE EXTRA 10MG  FOR SBP>160 120 tablet 9   ipratropium-albuterol  (DUONEB) 0.5-2.5 (3) MG/3ML SOLN Take 3 mLs by nebulization every 6 (six) hours as needed (Asthma). 360 mL 3   iron  polysaccharides (POLY-IRON  150) 150 MG capsule Take 1 capsule (150 mg total) by mouth daily. 90 capsule 3   Lactobacillus (ACIDOPHILUS) CAPS capsule Take 1 capsule by mouth daily. 100 capsule 3   levothyroxine  (SYNTHROID ) 100 MCG tablet Take 1 tablet (100 mcg total) by mouth daily before breakfast. 30 tablet 11   loperamide  (IMODIUM  A-D) 2 MG tablet Take 1 tablet (2 mg total) by mouth 4 (four) times daily as needed for diarrhea or loose stools. 30 tablet 3   Metamucil Fiber 2 g CHEW Chew 2 tablets by mouth in the morning and at bedtime. 100 tablet 11   mirtazapine  (REMERON ) 30 MG tablet Take 1 tablet (30 mg total) by mouth at bedtime. 30 tablet 5   omega-3  acid ethyl esters (LOVAZA ) 1 g capsule Take 2 capsules (2 g total) by mouth 2 (two) times daily. 120 capsule 11   ondansetron  (ZOFRAN ) 4 MG tablet Take 1 tablet (4 mg total) by mouth every 8 (eight) hours as needed for nausea or vomiting. 30 tablet 0   OVER THE COUNTER MEDICATION Take 1 capsule by mouth daily. 11 MUSHROOM BLEND     Polyethyl Glycol-Propyl Glycol (SYSTANE) 0.4-0.3 % SOLN Place 1 drop into both eyes 3 (three) times daily as needed (dry eyes). Place 1 drop into both eyes 3 (three) times daily as needed (dry eyes). 15 mL 2   rosuvastatin  (CRESTOR ) 10 MG tablet Take 1 tablet (10 mg total) by mouth daily. 90 tablet 3   sodium chloride  (OCEAN) 0.65 % SOLN nasal spray Place 1 spray into both nostrils as  needed for congestion. 30 mL 1   traMADol  (ULTRAM ) 50 MG tablet Take 1 tablet (50 mg total) by mouth every 6 (six) hours as needed for severe pain (pain score 7-10). 120 tablet 2   ciprofloxacin  (CIPRO ) 250 MG tablet Take 1 tablet (250 mg total) by mouth 2 (two) times daily. (Patient not taking: Reported on 01/03/2024) 14 tablet 2   No facility-administered medications prior to visit.    ROS: Review of Systems  Constitutional:  Positive for fatigue. Negative for activity change, appetite change, chills and unexpected weight change.  HENT:  Negative for congestion, mouth sores and sinus pressure.   Eyes:  Negative for visual disturbance.  Respiratory:  Negative for cough and chest tightness.   Cardiovascular:  Positive for leg swelling.  Gastrointestinal:  Negative for abdominal pain and nausea.  Genitourinary:  Negative for difficulty urinating, frequency and vaginal pain.  Musculoskeletal:  Positive for arthralgias and gait problem. Negative for back pain.  Skin:  Negative for pallor and rash.  Neurological:  Negative for dizziness, tremors, weakness, numbness and headaches.  Psychiatric/Behavioral:  Positive for confusion and decreased concentration. Negative for sleep disturbance and suicidal ideas. The patient is not nervous/anxious.     Objective:  BP 128/84 (BP Location: Left Arm, Patient Position: Sitting)   Pulse 75   Temp 97.6 F (36.4 C) (Temporal)   Ht 5' (1.524 m)   SpO2 94%   BMI 33.20 kg/m   BP Readings from Last 3 Encounters:  01/03/24 128/84  11/14/23 (!) 146/98  10/31/23 133/75    Wt Readings from Last 3 Encounters:  11/14/23 170 lb (77.1 kg)  10/24/23 172 lb 9.6 oz (78.3 kg)  09/06/23 160 lb (72.6 kg)    Physical Exam Constitutional:      General: She is not in acute distress.    Appearance: She is well-developed. She is obese.  HENT:     Head: Normocephalic.     Right Ear: External ear normal.     Left Ear: External ear normal.     Nose: Nose  normal.  Eyes:     General:        Right eye: No discharge.        Left eye: No discharge.     Conjunctiva/sclera: Conjunctivae normal.     Pupils: Pupils are equal, round, and reactive to light.  Neck:     Thyroid : No thyromegaly.     Vascular: No JVD.     Trachea: No tracheal deviation.  Cardiovascular:     Rate and Rhythm: Normal rate and regular rhythm.     Heart sounds: Normal heart sounds.  Pulmonary:  Effort: No respiratory distress.     Breath sounds: No stridor. No wheezing.  Abdominal:     General: Bowel sounds are normal. There is no distension.     Palpations: Abdomen is soft. There is no mass.     Tenderness: There is no abdominal tenderness. There is no guarding or rebound.  Musculoskeletal:        General: No tenderness.     Cervical back: Normal range of motion and neck supple. No rigidity.     Right lower leg: Edema present.     Left lower leg: Edema present.  Lymphadenopathy:     Cervical: No cervical adenopathy.  Skin:    Findings: No erythema or rash.  Neurological:     Mental Status: Mental status is at baseline.     Cranial Nerves: No cranial nerve deficit.     Motor: No abnormal muscle tone.     Coordination: Coordination abnormal.     Gait: Gait abnormal.     Deep Tendon Reflexes: Reflexes normal.  Psychiatric:        Behavior: Behavior normal.        Thought Content: Thought content normal.        Judgment: Judgment normal.   Trace edema - soft In a w/c Alert, cooperative   A total time of 45 minutes was spent preparing to see the patient, reviewing tests, x-rays, operative reports and other medical records.  Also, obtaining history and performing comprehensive physical exam.  Additionally, counseling the patient and her son regarding the above listed issues -depression, failure to thrive, cancer.   Finally, documenting clinical information in the health records, coordination of care, educating the patient, filling out FMLA form for [name].  It is a complex case.   Lab Results  Component Value Date   WBC 7.2 01/03/2024   HGB 12.4 01/03/2024   HCT 36.6 01/03/2024   PLT 353.0 01/03/2024   GLUCOSE 100 (H) 01/03/2024   CHOL 277 (H) 09/23/2022   TRIG 276 (H) 09/23/2022   HDL 51 09/23/2022   LDLDIRECT 64 11/14/2023   LDLCALC 174 (H) 09/23/2022   ALT 14 01/03/2024   AST 16 01/03/2024   NA 137 01/03/2024   K 4.4 01/03/2024   CL 104 01/03/2024   CREATININE 1.27 (H) 01/03/2024   BUN 32 (H) 01/03/2024   CO2 24 01/03/2024   TSH 3.90 01/03/2024   INR 1.0 07/15/2020   HGBA1C 6.0 01/03/2024    CT CHEST ABDOMEN PELVIS W CONTRAST Result Date: 11/26/2023 CLINICAL DATA:  History of gentle cancer, monitor. * Tracking Code: BO * EXAM: CT CHEST, ABDOMEN, AND PELVIS WITH CONTRAST TECHNIQUE: Multidetector CT imaging of the chest, abdomen and pelvis was performed following the standard protocol during bolus administration of intravenous contrast. RADIATION DOSE REDUCTION: This exam was performed according to the departmental dose-optimization program which includes automated exposure control, adjustment of the mA and/or kV according to patient size and/or use of iterative reconstruction technique. CONTRAST:  80mL OMNIPAQUE  IOHEXOL  300 MG/ML  SOLN COMPARISON:  Multiple priors including CT January 19, 2023 FINDINGS: CT CHEST FINDINGS Cardiovascular: Aortic atherosclerosis. Normal caliber thoracic aorta. Normal size heart. No significant pericardial effusion/thickening. Mediastinum/Nodes: No supraclavicular adenopathy. No suspicious thyroid  nodule. No pathologically enlarged mediastinal, hilar or axillary lymph nodes. The esophagus is grossly unremarkable. Lungs/Pleura: Stable postsurgical change/scarring in the left upper lobe. No suspicious pulmonary nodules or masses. Scattered atelectasis/scarring with dependent bibasilar bronchiectasis/bronchiolectasis unchanged from prior. Musculoskeletal: No aggressive lytic or blastic lesion of bone.  Anterior  cervical fusion hardware. Multilevel degenerative changes spine. CT ABDOMEN PELVIS FINDINGS Hepatobiliary: Diffuse hepatic steatosis. Gallbladder surgically absent. Similar appearance of biliary tree post cholecystectomy. Pancreas: No pancreatic ductal dilation or evidence of acute inflammation. Spleen: No splenomegaly. Adrenals/Urinary Tract: No suspicious adrenal nodule/mass. No hydronephrosis. 3 mm left lower pole nonobstructive renal stone. Urinary bladder is unremarkable for degree of distension. Stomach/Bowel: Stomach is nondistended limiting evaluation. No pathologic dilation of small or large bowel. Colonic stool burden compatible with constipation. No evidence of acute bowel inflammation. Vascular/Lymphatic: Normal caliber abdominal aorta. Aortic atherosclerosis. No pathologically enlarged abdominal or pelvic lymph nodes. Reproductive: Uterus surgically absent without new suspicious nodularity along the vaginal cuff. No suspicious adnexal mass. Other: Postsurgical change in the abdominal wall. No significant abdominopelvic free fluid. No discrete peritoneal or omental nodularity. Musculoskeletal: No aggressive lytic or blastic lesion of bone. Right total hip arthroplasty. Moderate to advanced degenerative change of the left hip. Multilevel degenerative changes spine. IMPRESSION: 1. Stable examination status post hysterectomy without evidence of local recurrence or metastatic disease within the chest, abdomen, or pelvis. 2. Diffuse hepatic steatosis. 3. Nonobstructive 3 mm left lower pole renal stone. 4. Colonic stool burden compatible with constipation. 5.  Aortic Atherosclerosis (ICD10-I70.0). Electronically Signed   By: Reyes Holder M.D.   On: 11/26/2023 12:32    Assessment & Plan:   Problem List Items Addressed This Visit     Adjustment disorder with anxiety   Dr Tasia would like patient to stop welbutrin and prozac . Continue remeron . Starting Auvelity .       Relevant Orders   CBC with  Differential/Platelet (Completed)   Comprehensive metabolic panel with GFR (Completed)   Hemoglobin A1c (Completed)   TSH (Completed)   Bipolar affective disorder, current episode depressed (HCC)   Now Auvelity  was added  F/u w/Dr Plovsky      Brain syndrome, posttraumatic   Foggy brain sx's      Relevant Orders   CBC with Differential/Platelet (Completed)   Comprehensive metabolic panel with GFR (Completed)   Hemoglobin A1c (Completed)   TSH (Completed)   CKD stage 3b, GFR 30-44 ml/min (HCC)   Relevant Orders   Comprehensive metabolic panel with GFR (Completed)   Leiomyosarcoma (HCC) - Primary   Relevant Orders   CT HEAD WO CONTRAST ( )   Vitamin D  deficiency   On Vit D      Other Visit Diagnoses       Confusion       Relevant Orders   CT HEAD WO CONTRAST ( )         No orders of the defined types were placed in this encounter.     Follow-up: No follow-ups on file.  Marolyn Noel, MD

## 2024-01-03 NOTE — Assessment & Plan Note (Signed)
 Now Auvelity  was added  F/u w/Dr [facility]

## 2024-01-03 NOTE — Telephone Encounter (Signed)
 Patient called asking about her FL2 forms.  Patient has memory loss, made her aware that we will provide her with 5 copies of the forms so when she goes to [facility] they can add in the facility that has a bed

## 2024-01-03 NOTE — Assessment & Plan Note (Signed)
 On Vit D

## 2024-01-03 NOTE — Assessment & Plan Note (Signed)
 Foggy brain sx's

## 2024-01-04 ENCOUNTER — Ambulatory Visit: Payer: Self-pay | Admitting: Internal Medicine

## 2024-01-04 ENCOUNTER — Ambulatory Visit: Admitting: Psychology

## 2024-01-04 DIAGNOSIS — F4323 Adjustment disorder with mixed anxiety and depressed mood: Secondary | ICD-10-CM

## 2024-01-04 NOTE — Progress Notes (Addendum)
 [name] is a 73 y.o. 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  Symptoms A diagnosis of an acute, serious illness that is life-threatening. (Status: maintained) -- No Description Entered  Diminished interest in or enjoyment of activities. (Status: maintained) -- No Description Entered  Lack of energy. (Status: maintained) -- No Description Entered  Sad affect, social withdrawal, anxiety, loss of interest in activities, and low energy. (Status: maintained) -- No Description Entered  Medication Status compliance  Safety unspecified If Suicidal or Homicidal State Action Taken: unspecified  Current Risk:  Medications Buspar  (Dosage: 30mg  2X/day)  New Medication (Dosage: 250mg  2x/day)  New Medication (Dosage: 250mg  2x/day)  Prozac  (Dosage: 40mg  daily)  Remeron  (Dosage: 30mg  nightly)  xanax  (Dosage: .5mg  2-3X/day)  Objectives Related Problem: Recognize, accept, and cope with feelings of depression. Description: Identify and replace thoughts and beliefs that support depression. Target Date: 2024-04-28 Frequency: Daily Modality: individual Progress: 80%  Related Problem: Recognize, accept, and cope with feelings of depression. Description: Verbalize an understanding of healthy and unhealthy emotions with the intent of increasing the use of healthy emotions to guide actions. Target Date: 2024-04-28 Frequency: Daily Modality: individual Progress: 85%  Related Problem: Recognize, accept, and cope with feelings of depression. Description: Increasingly verbalize hopeful and positive statements regarding self, others, and the future. Target Date: 2022-04-28 Frequency: Daily Modality: individual Progress: 100%  Related Problem: Reduce fear, anxiety, and worry associated with the medical condition. Description:  Share with significant others efforts to adapt successfully to the medical condition/diagnosis. Target Date: 2024-04-28 Frequency: Daily Modality: individual Progress: 85%  Related Problem: Reduce fear, anxiety, and worry associated with the medical condition. Description: Engage in social, productive, and recreational activities that are possible in spite of medical condition. Target Date: 2024-04-28 Frequency: Daily Modality: individual Progress: 90%  Related Problem: Reduce fear, anxiety, and worry associated with the medical condition. Description: Identify the coping skills and sources of emotional support that have been beneficial in the past. Target Date: 2024-04-28 Frequency: Daily Modality: individual Progress: 90%  Client Response full compliance  Service Location Location, [address] [place], [place] [address]  Service Code cpt [phone]  Related past to present  Facilitate problem solving  Identify/label emotions  Validate/empathize  Self care activities  Lifestyle change (exercise, nutrition)  Assess/facilitate readiness to change  Relaxation training  Rationally challenge thoughts or beliefs/cognitive restructuring  Comments  Dx: F43.21  Meds: Prozac , Buspar  and Remeron .  Goals: Following the failure of a third marriage, patient is struggling to rebuild her life and adjust to her new circumstances. She suffers a negative self-concept and has little confidence in her judgement. She is seeking to reduce symptoms of anxiety and self-doubt and to re-define satisfa ction moving forward. Therapy will focus on cognitive restructuring to redefine her experiences and focus on those satisfying aspects of her life. Will target this goal completion for April1, 2021 (completed). She is also wanting to utilize counseling to adjust and accept her latest diagnosis of cancer. She wants assistance to better manage this news and remain positive throughout treatment. Goal date is  12-22. Patient has made progress, but her medical condition is variable and she requests additional treatment to manage moods throughout her  medical ordeal. In addition, she is trying to manage feelings associated with her older son's mental health struggles. Revised goal date is 12-25.   Patient agrees to a video session and is aware of the limitations of this platform. She is at home and I am in home office.   [name]: She says she fell in the bathroom and hit her head. Having trouble with balance. She is feeling more and more confused lately. She is not doing well medically. [name] is taking some time off to watch after her, feeling Margareth Kanner is not safe home alone. Her condition is deteriorating as is her ability to fully care for herself. She has full body pain due to Fibromyalgia. Her moods are still down as she feels discouraged. States I just want to move somewhere soon.                                                                                                                                                                                                                                                                                                                              Maisey Deandrade LEWIS Time: 3:15p-4:00p 45 minutes

## 2024-01-05 ENCOUNTER — Other Ambulatory Visit: Payer: Self-pay

## 2024-01-05 ENCOUNTER — Other Ambulatory Visit (HOSPITAL_COMMUNITY): Payer: Self-pay

## 2024-01-05 ENCOUNTER — Ambulatory Visit: Admitting: Pulmonary Disease

## 2024-01-06 ENCOUNTER — Other Ambulatory Visit: Payer: Self-pay

## 2024-01-06 ENCOUNTER — Ambulatory Visit
Admission: RE | Admit: 2024-01-06 | Discharge: 2024-01-06 | Disposition: A | Discharge status: Home / Self Care | Source: Ambulatory Visit | Attending: Internal Medicine | Admitting: Internal Medicine

## 2024-01-06 ENCOUNTER — Other Ambulatory Visit (HOSPITAL_COMMUNITY): Payer: Self-pay

## 2024-01-06 DIAGNOSIS — C499 Malignant neoplasm of connective and soft tissue, unspecified: Secondary | ICD-10-CM

## 2024-01-06 DIAGNOSIS — R413 Other amnesia: Secondary | ICD-10-CM | POA: Diagnosis not present

## 2024-01-06 DIAGNOSIS — R262 Difficulty in walking, not elsewhere classified: Secondary | ICD-10-CM | POA: Diagnosis not present

## 2024-01-06 DIAGNOSIS — I10 Essential (primary) hypertension: Secondary | ICD-10-CM | POA: Diagnosis not present

## 2024-01-06 DIAGNOSIS — M797 Fibromyalgia: Secondary | ICD-10-CM | POA: Diagnosis not present

## 2024-01-06 DIAGNOSIS — R410 Disorientation, unspecified: Secondary | ICD-10-CM

## 2024-01-06 DIAGNOSIS — M6281 Muscle weakness (generalized): Secondary | ICD-10-CM | POA: Diagnosis not present

## 2024-01-06 DIAGNOSIS — R2681 Unsteadiness on feet: Secondary | ICD-10-CM | POA: Diagnosis not present

## 2024-01-06 DIAGNOSIS — R55 Syncope and collapse: Secondary | ICD-10-CM | POA: Diagnosis not present

## 2024-01-07 DIAGNOSIS — I10 Essential (primary) hypertension: Secondary | ICD-10-CM | POA: Diagnosis not present

## 2024-01-07 DIAGNOSIS — R2681 Unsteadiness on feet: Secondary | ICD-10-CM | POA: Diagnosis not present

## 2024-01-07 DIAGNOSIS — M6281 Muscle weakness (generalized): Secondary | ICD-10-CM | POA: Diagnosis not present

## 2024-01-07 DIAGNOSIS — R262 Difficulty in walking, not elsewhere classified: Secondary | ICD-10-CM | POA: Diagnosis not present

## 2024-01-07 DIAGNOSIS — R55 Syncope and collapse: Secondary | ICD-10-CM | POA: Diagnosis not present

## 2024-01-08 ENCOUNTER — Encounter: Payer: Self-pay | Admitting: Internal Medicine

## 2024-01-09 ENCOUNTER — Ambulatory Visit (INDEPENDENT_AMBULATORY_CARE_PROVIDER_SITE_OTHER): Admitting: Psychology

## 2024-01-09 ENCOUNTER — Other Ambulatory Visit: Payer: Self-pay

## 2024-01-09 ENCOUNTER — Other Ambulatory Visit (HOSPITAL_COMMUNITY): Payer: Self-pay

## 2024-01-09 ENCOUNTER — Telehealth: Payer: Self-pay | Admitting: Internal Medicine

## 2024-01-09 DIAGNOSIS — R2681 Unsteadiness on feet: Secondary | ICD-10-CM | POA: Diagnosis not present

## 2024-01-09 DIAGNOSIS — I10 Essential (primary) hypertension: Secondary | ICD-10-CM | POA: Diagnosis not present

## 2024-01-09 DIAGNOSIS — R55 Syncope and collapse: Secondary | ICD-10-CM | POA: Diagnosis not present

## 2024-01-09 DIAGNOSIS — R262 Difficulty in walking, not elsewhere classified: Secondary | ICD-10-CM | POA: Diagnosis not present

## 2024-01-09 DIAGNOSIS — F4323 Adjustment disorder with mixed anxiety and depressed mood: Secondary | ICD-10-CM

## 2024-01-09 DIAGNOSIS — M6281 Muscle weakness (generalized): Secondary | ICD-10-CM | POA: Diagnosis not present

## 2024-01-09 NOTE — Telephone Encounter (Signed)
 Patient called and is permanently at [facility]; she just moved in. She said they don't have her Dextromethorphan -buPROPion  ER (AUVELITY ) 45-105 MG TBCR on her medication list, so she has missed taking it for 4 days. They said they need the approval faxed over for her to receive the medication. Best callback for patient is [phone].

## 2024-01-09 NOTE — Progress Notes (Signed)
 [name] is a 73 y.o. 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  Symptoms A diagnosis of an acute, serious illness that is life-threatening. (Status: maintained) -- No Description Entered  Diminished interest in or enjoyment of activities. (Status: maintained) -- No Description Entered  Lack of energy. (Status: maintained) -- No Description Entered  Sad affect, social withdrawal, anxiety, loss of interest in activities, and low energy. (Status: maintained) -- No Description Entered  Medication Status compliance  Safety unspecified If Suicidal or Homicidal State Action Taken: unspecified  Current Risk:  Medications Buspar  (Dosage: 30mg  2X/day)  New Medication (Dosage: 250mg  2x/day)  New Medication (Dosage: 250mg  2x/day)  Prozac  (Dosage: 40mg  daily)  Remeron  (Dosage: 30mg  nightly)  xanax  (Dosage: .5mg  2-3X/day)  Objectives Related Problem: Recognize, accept, and cope with feelings of depression. Description: Identify and replace thoughts and beliefs that support depression. Target Date: 2024-04-28 Frequency: Daily Modality: individual Progress: 80%  Related Problem: Recognize, accept, and cope with feelings of depression. Description: Verbalize an understanding of healthy and unhealthy emotions with the intent of increasing the use of healthy emotions to guide actions. Target Date: 2024-04-28 Frequency: Daily Modality: individual Progress: 85%  Related Problem: Recognize, accept, and cope with feelings of depression. Description: Increasingly verbalize hopeful and positive statements regarding self, others, and the future. Target Date: 2022-04-28 Frequency: Daily Modality: individual Progress: 100%  Related Problem: Reduce fear, anxiety, and worry associated with the medical  condition. Description: Share with significant others efforts to adapt successfully to the medical condition/diagnosis. Target Date: 2024-04-28 Frequency: Daily Modality: individual Progress: 85%  Related Problem: Reduce fear, anxiety, and worry associated with the medical condition. Description: Engage in social, productive, and recreational activities that are possible in spite of medical condition. Target Date: 2024-04-28 Frequency: Daily Modality: individual Progress: 90%  Related Problem: Reduce fear, anxiety, and worry associated with the medical condition. Description: Identify the coping skills and sources of emotional support that have been beneficial in the past. Target Date: 2024-04-28 Frequency: Daily Modality: individual Progress: 90%  Client Response full compliance  Service Location Location, [address] [place], [place] [address]  Service Code cpt [phone]  Related past to present  Facilitate problem solving  Identify/label emotions  Validate/empathize  Self care activities  Lifestyle change (exercise, nutrition)  Assess/facilitate readiness to change  Relaxation training  Rationally challenge thoughts or beliefs/cognitive restructuring  Comments  Dx: F43.21  Meds: Prozac , Buspar  and Remeron .  Goals: Following the failure of a third marriage, patient is struggling to rebuild her life and adjust to her new circumstances. She suffers a negative self-concept and has little confidence in her judgement. She is seeking to reduce symptoms of anxiety and self-doubt and to re-define satisfa ction moving forward. Therapy will focus on cognitive restructuring to redefine her experiences and focus on those satisfying aspects of her life. Will target this goal completion for April1, 2021 (completed). She is also wanting to utilize counseling to adjust and accept her latest diagnosis of cancer. She wants assistance to better manage this news and remain positive throughout  treatment. Goal date is 12-22. Patient has made progress, but  her medical condition is variable and she requests additional treatment to manage moods throughout her medical ordeal. In addition, she is trying to manage feelings associated with her older son's mental health struggles. Revised goal date is 12-25.   Patient agrees to a video session and is aware of the limitations of this platform. She is at home and I am in home office.   [name]: She made the move to [facility] on Friday. She states that the facility is nice and she likes the staff and food.She is adjusting well and we discussed the likelihood that there will be further adjustments she will need to make. States that the new med, Bupropion , is working well to alleviate symptoms of depression and anxiety. She is very relieved and optimistic about her new home. She has a strong support system, which will facilitate her adjustment.                                                                                                                                                                                                                                                                                                                                   [name] Time: 4:10p-5:00p 50 minutes

## 2024-01-10 ENCOUNTER — Other Ambulatory Visit: Payer: Self-pay | Admitting: Internal Medicine

## 2024-01-10 ENCOUNTER — Other Ambulatory Visit: Payer: Self-pay

## 2024-01-10 ENCOUNTER — Other Ambulatory Visit (HOSPITAL_COMMUNITY): Payer: Self-pay

## 2024-01-10 DIAGNOSIS — R262 Difficulty in walking, not elsewhere classified: Secondary | ICD-10-CM | POA: Diagnosis not present

## 2024-01-10 DIAGNOSIS — I10 Essential (primary) hypertension: Secondary | ICD-10-CM | POA: Diagnosis not present

## 2024-01-10 DIAGNOSIS — R2681 Unsteadiness on feet: Secondary | ICD-10-CM | POA: Diagnosis not present

## 2024-01-10 DIAGNOSIS — M797 Fibromyalgia: Secondary | ICD-10-CM | POA: Diagnosis not present

## 2024-01-10 DIAGNOSIS — M6281 Muscle weakness (generalized): Secondary | ICD-10-CM | POA: Diagnosis not present

## 2024-01-10 DIAGNOSIS — R55 Syncope and collapse: Secondary | ICD-10-CM | POA: Diagnosis not present

## 2024-01-10 MED ORDER — POLYSACCHARIDE IRON COMPLEX 150 MG PO CAPS
150.0000 mg | ORAL_CAPSULE | Freq: Every day | ORAL | 3 refills | Status: DC
  Filled 2024-01-15: qty 90, 90d supply, fill #0

## 2024-01-10 NOTE — Telephone Encounter (Signed)
 Please inform pt of the folowing per her PCP as Mechele tried to reach the pt and there was no answer and her VM is full... PCP has stated This prescription is under Dr. Tasia (her psychiatrist).  You can print the prescription and fax it under his name.  Thanks

## 2024-01-10 NOTE — Telephone Encounter (Signed)
 This prescription is under Dr. Tasia (her psychiatrist).  You can print the prescription and fax it under his name.  Thanks

## 2024-01-11 ENCOUNTER — Telehealth: Payer: Self-pay | Admitting: *Deleted

## 2024-01-11 DIAGNOSIS — E785 Hyperlipidemia, unspecified: Secondary | ICD-10-CM | POA: Diagnosis not present

## 2024-01-11 DIAGNOSIS — R2681 Unsteadiness on feet: Secondary | ICD-10-CM | POA: Diagnosis not present

## 2024-01-11 DIAGNOSIS — E88819 Insulin resistance, unspecified: Secondary | ICD-10-CM | POA: Diagnosis not present

## 2024-01-11 DIAGNOSIS — N183 Chronic kidney disease, stage 3 unspecified: Secondary | ICD-10-CM | POA: Diagnosis not present

## 2024-01-11 DIAGNOSIS — R55 Syncope and collapse: Secondary | ICD-10-CM | POA: Diagnosis not present

## 2024-01-11 DIAGNOSIS — I10 Essential (primary) hypertension: Secondary | ICD-10-CM | POA: Diagnosis not present

## 2024-01-11 DIAGNOSIS — I700 Atherosclerosis of aorta: Secondary | ICD-10-CM | POA: Diagnosis not present

## 2024-01-11 DIAGNOSIS — Z471 Aftercare following joint replacement surgery: Secondary | ICD-10-CM | POA: Diagnosis not present

## 2024-01-11 DIAGNOSIS — M6281 Muscle weakness (generalized): Secondary | ICD-10-CM | POA: Diagnosis not present

## 2024-01-11 DIAGNOSIS — Z96641 Presence of right artificial hip joint: Secondary | ICD-10-CM | POA: Diagnosis not present

## 2024-01-11 DIAGNOSIS — C499 Malignant neoplasm of connective and soft tissue, unspecified: Secondary | ICD-10-CM | POA: Diagnosis not present

## 2024-01-11 DIAGNOSIS — I150 Renovascular hypertension: Secondary | ICD-10-CM | POA: Diagnosis not present

## 2024-01-11 DIAGNOSIS — R262 Difficulty in walking, not elsewhere classified: Secondary | ICD-10-CM | POA: Diagnosis not present

## 2024-01-11 DIAGNOSIS — K760 Fatty (change of) liver, not elsewhere classified: Secondary | ICD-10-CM | POA: Diagnosis not present

## 2024-01-11 DIAGNOSIS — M797 Fibromyalgia: Secondary | ICD-10-CM | POA: Diagnosis not present

## 2024-01-11 NOTE — Telephone Encounter (Signed)
 Spoke with patient who called the office to reschedule her appt. For this Thursday with Dr. Viktoria. Pt verbalized in length that she is not feeling up to her appointment. She states that she had to stop driving, sell her car and all her furniture and is now at [facility] [place]. She moved in there last Friday, 8/22.   Patient reports she had a head CT scan that showed frontal/temporal lobe shrinkage and she failed a cognitive test. And has been diagnosed with mild dementia and memory loss. Pt states she gets off balance and disoriented and is using a walker and or wheelchair.  When asked patient if she would like to switch her appt. To a phone visit, she said her cell service is not that reliable? And would rather reschedule her appointment because she doesn't feel up to making her appt. Tomorrow due to exhaustion.  Pt was given a follow up appt. On Friday, October 17 th. At 1 pm. Emotional support provided to patient and allowed her to verbalize her concerns and she spoke highly of her son [name] who is a great support for her.  Pt thanked the office for allowing her to reschedule her appointment.

## 2024-01-12 ENCOUNTER — Other Ambulatory Visit: Payer: Self-pay

## 2024-01-12 ENCOUNTER — Other Ambulatory Visit (HOSPITAL_COMMUNITY): Payer: Self-pay

## 2024-01-12 ENCOUNTER — Inpatient Hospital Stay: Admitting: Gynecologic Oncology

## 2024-01-12 DIAGNOSIS — M797 Fibromyalgia: Secondary | ICD-10-CM | POA: Diagnosis not present

## 2024-01-12 DIAGNOSIS — C55 Malignant neoplasm of uterus, part unspecified: Secondary | ICD-10-CM

## 2024-01-12 DIAGNOSIS — R262 Difficulty in walking, not elsewhere classified: Secondary | ICD-10-CM | POA: Diagnosis not present

## 2024-01-12 DIAGNOSIS — M6281 Muscle weakness (generalized): Secondary | ICD-10-CM | POA: Diagnosis not present

## 2024-01-12 DIAGNOSIS — R2681 Unsteadiness on feet: Secondary | ICD-10-CM | POA: Diagnosis not present

## 2024-01-12 DIAGNOSIS — R55 Syncope and collapse: Secondary | ICD-10-CM | POA: Diagnosis not present

## 2024-01-12 DIAGNOSIS — I10 Essential (primary) hypertension: Secondary | ICD-10-CM | POA: Diagnosis not present

## 2024-01-13 ENCOUNTER — Other Ambulatory Visit: Payer: Self-pay | Admitting: Internal Medicine

## 2024-01-13 ENCOUNTER — Other Ambulatory Visit (HOSPITAL_COMMUNITY): Payer: Self-pay

## 2024-01-13 DIAGNOSIS — M797 Fibromyalgia: Secondary | ICD-10-CM | POA: Diagnosis not present

## 2024-01-13 DIAGNOSIS — M6281 Muscle weakness (generalized): Secondary | ICD-10-CM | POA: Diagnosis not present

## 2024-01-13 DIAGNOSIS — R55 Syncope and collapse: Secondary | ICD-10-CM | POA: Diagnosis not present

## 2024-01-13 DIAGNOSIS — R262 Difficulty in walking, not elsewhere classified: Secondary | ICD-10-CM | POA: Diagnosis not present

## 2024-01-13 DIAGNOSIS — I10 Essential (primary) hypertension: Secondary | ICD-10-CM | POA: Diagnosis not present

## 2024-01-13 DIAGNOSIS — R2681 Unsteadiness on feet: Secondary | ICD-10-CM | POA: Diagnosis not present

## 2024-01-13 MED ORDER — LEVOTHYROXINE SODIUM 100 MCG PO TABS
100.0000 ug | ORAL_TABLET | Freq: Every day | ORAL | 11 refills | Status: AC
  Filled 2024-01-15: qty 30, 30d supply, fill #0

## 2024-01-13 MED ORDER — OMEGA-3-ACID ETHYL ESTERS 1 G PO CAPS
2.0000 | ORAL_CAPSULE | Freq: Two times a day (BID) | ORAL | 11 refills | Status: DC
  Filled 2024-01-15: qty 120, 30d supply, fill #0

## 2024-01-13 NOTE — Telephone Encounter (Signed)
 Tried to call pts son in regards to [facility] paperwork concerns. I lvm for pts son of Paperwork being faxed over as requested on sticky note that watch left on my desk. I can make copies for pts son and he is welcome to pick them up.

## 2024-01-13 NOTE — Telephone Encounter (Unsigned)
 Copied from CRM [id number]. Topic: Medical Record Request - Other >> Jan 13, 2024 10:36 AM Suzen RAMAN wrote: Reason for CRM: Patient son called to check on the status of FMLA paperwork that was dropped off to the office 01/03/24. No notation located in since per agent or CAL. Caller states it was handed to provider assistance(not sure of name)  Please contact to advise.   CB# [phone]

## 2024-01-15 ENCOUNTER — Other Ambulatory Visit: Payer: Self-pay

## 2024-01-16 DIAGNOSIS — R2681 Unsteadiness on feet: Secondary | ICD-10-CM | POA: Diagnosis not present

## 2024-01-16 DIAGNOSIS — R55 Syncope and collapse: Secondary | ICD-10-CM | POA: Diagnosis not present

## 2024-01-16 DIAGNOSIS — R262 Difficulty in walking, not elsewhere classified: Secondary | ICD-10-CM | POA: Diagnosis not present

## 2024-01-16 DIAGNOSIS — I10 Essential (primary) hypertension: Secondary | ICD-10-CM | POA: Diagnosis not present

## 2024-01-16 DIAGNOSIS — M6281 Muscle weakness (generalized): Secondary | ICD-10-CM | POA: Diagnosis not present

## 2024-01-16 DIAGNOSIS — M797 Fibromyalgia: Secondary | ICD-10-CM | POA: Diagnosis not present

## 2024-01-17 ENCOUNTER — Other Ambulatory Visit: Payer: Self-pay

## 2024-01-17 DIAGNOSIS — I10 Essential (primary) hypertension: Secondary | ICD-10-CM | POA: Diagnosis not present

## 2024-01-17 DIAGNOSIS — R262 Difficulty in walking, not elsewhere classified: Secondary | ICD-10-CM | POA: Diagnosis not present

## 2024-01-17 DIAGNOSIS — M797 Fibromyalgia: Secondary | ICD-10-CM | POA: Diagnosis not present

## 2024-01-17 DIAGNOSIS — M6281 Muscle weakness (generalized): Secondary | ICD-10-CM | POA: Diagnosis not present

## 2024-01-17 DIAGNOSIS — R2681 Unsteadiness on feet: Secondary | ICD-10-CM | POA: Diagnosis not present

## 2024-01-17 DIAGNOSIS — R55 Syncope and collapse: Secondary | ICD-10-CM | POA: Diagnosis not present

## 2024-01-18 ENCOUNTER — Ambulatory Visit (INDEPENDENT_AMBULATORY_CARE_PROVIDER_SITE_OTHER): Admitting: Psychology

## 2024-01-18 DIAGNOSIS — M797 Fibromyalgia: Secondary | ICD-10-CM | POA: Diagnosis not present

## 2024-01-18 DIAGNOSIS — R2681 Unsteadiness on feet: Secondary | ICD-10-CM | POA: Diagnosis not present

## 2024-01-18 DIAGNOSIS — I10 Essential (primary) hypertension: Secondary | ICD-10-CM | POA: Diagnosis not present

## 2024-01-18 DIAGNOSIS — R262 Difficulty in walking, not elsewhere classified: Secondary | ICD-10-CM | POA: Diagnosis not present

## 2024-01-18 DIAGNOSIS — R55 Syncope and collapse: Secondary | ICD-10-CM | POA: Diagnosis not present

## 2024-01-18 DIAGNOSIS — M6281 Muscle weakness (generalized): Secondary | ICD-10-CM | POA: Diagnosis not present

## 2024-01-18 DIAGNOSIS — F4323 Adjustment disorder with mixed anxiety and depressed mood: Secondary | ICD-10-CM | POA: Diagnosis not present

## 2024-01-18 NOTE — Progress Notes (Signed)
 [name] is a 73 y.o. 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  Symptoms A diagnosis of an acute, serious illness that is life-threatening. (Status: maintained) -- No Description Entered  Diminished interest in or enjoyment of activities. (Status: maintained) -- No Description Entered  Lack of energy. (Status: maintained) -- No Description Entered  Sad affect, social withdrawal, anxiety, loss of interest in activities, and low energy. (Status: maintained) -- No Description Entered  Medication Status compliance  Safety unspecified If Suicidal or Homicidal State Action Taken: unspecified  Current Risk:  Medications Buspar  (Dosage: 30mg  2X/day)  New Medication (Dosage: 250mg  2x/day)  New Medication (Dosage: 250mg  2x/day)  Prozac  (Dosage: 40mg  daily)  Remeron  (Dosage: 30mg  nightly)  xanax  (Dosage: .5mg  2-3X/day)  Objectives Related Problem: Recognize, accept, and cope with feelings of depression. Description: Identify and replace thoughts and beliefs that support depression. Target Date: 2024-04-28 Frequency: Daily Modality: individual Progress: 80%  Related Problem: Recognize, accept, and cope with feelings of depression. Description: Verbalize an understanding of healthy and unhealthy emotions with the intent of increasing the use of healthy emotions to guide actions. Target Date: 2024-04-28 Frequency: Daily Modality: individual Progress: 85%  Related Problem: Recognize, accept, and cope with feelings of depression. Description: Increasingly verbalize hopeful and positive statements regarding self, others, and the future. Target Date: 2022-04-28 Frequency: Daily Modality: individual Progress: 100%  Related Problem: Reduce fear, anxiety, and worry associated with the medical  condition. Description: Share with significant others efforts to adapt successfully to the medical condition/diagnosis. Target Date: 2024-04-28 Frequency: Daily Modality: individual Progress: 85%  Related Problem: Reduce fear, anxiety, and worry associated with the medical condition. Description: Engage in social, productive, and recreational activities that are possible in spite of medical condition. Target Date: 2024-04-28 Frequency: Daily Modality: individual Progress: 90%  Related Problem: Reduce fear, anxiety, and worry associated with the medical condition. Description: Identify the coping skills and sources of emotional support that have been beneficial in the past. Target Date: 2024-04-28 Frequency: Daily Modality: individual Progress: 90%  Client Response full compliance  Service Location Location, [address] [place], [place] [address]  Service Code cpt [phone]  Related past to present  Facilitate problem solving  Identify/label emotions  Validate/empathize  Self care activities  Lifestyle change (exercise, nutrition)  Assess/facilitate readiness to change  Relaxation training  Rationally challenge thoughts or beliefs/cognitive restructuring  Comments  Dx: F43.21  Meds: Prozac , Buspar  and Remeron .  Goals: Following the failure of a third marriage, patient is struggling to rebuild her life and adjust to her new circumstances. She suffers a negative self-concept and has little confidence in her judgement. She is seeking to reduce symptoms of anxiety and self-doubt and to re-define satisfa ction moving forward. Therapy will focus on cognitive restructuring to redefine her experiences and focus on those satisfying aspects of her life. Will target this goal completion for April1, 2021 (completed). She is also wanting to utilize counseling to adjust and accept her latest diagnosis of cancer. She wants assistance to better manage this news and remain positive throughout  treatment. Goal date is 12-22. Patient has made progress, but  her medical condition is variable and she requests additional treatment to manage moods throughout her medical ordeal. In addition, she is trying to manage feelings associated with her older son's mental health struggles. Revised goal date is 12-25.   Patient agrees to a video session and is aware of the limitations of this platform. She is at home and I am in home office.   [name]: She states that the people at her facility told her she needs to see their psychiatrist and therapist. She also says they observe her and talk about her, which she finds uncomfortable. She says the nurses station is 10 rooms away, but claims she is able to hear them talking about her and thinks they have cameras in her room. She is very convinced that there is ongoing dialogue about her behavior from nurses station. Difficult to determine if this is paranoia or if it is actually happening.                                                                                                                                                                                                                                                                                                                                     Addley Ballinger LEWIS Time: 3:15p-4:00p 45 minutes

## 2024-01-19 ENCOUNTER — Telehealth: Admitting: Internal Medicine

## 2024-01-19 ENCOUNTER — Encounter: Payer: Self-pay | Admitting: Internal Medicine

## 2024-01-19 DIAGNOSIS — R262 Difficulty in walking, not elsewhere classified: Secondary | ICD-10-CM | POA: Diagnosis not present

## 2024-01-19 DIAGNOSIS — I10 Essential (primary) hypertension: Secondary | ICD-10-CM | POA: Diagnosis not present

## 2024-01-19 DIAGNOSIS — M797 Fibromyalgia: Secondary | ICD-10-CM | POA: Diagnosis not present

## 2024-01-19 DIAGNOSIS — M6281 Muscle weakness (generalized): Secondary | ICD-10-CM | POA: Diagnosis not present

## 2024-01-19 DIAGNOSIS — R55 Syncope and collapse: Secondary | ICD-10-CM | POA: Diagnosis not present

## 2024-01-19 DIAGNOSIS — R2681 Unsteadiness on feet: Secondary | ICD-10-CM | POA: Diagnosis not present

## 2024-01-20 DIAGNOSIS — M6281 Muscle weakness (generalized): Secondary | ICD-10-CM | POA: Diagnosis not present

## 2024-01-20 DIAGNOSIS — R55 Syncope and collapse: Secondary | ICD-10-CM | POA: Diagnosis not present

## 2024-01-20 DIAGNOSIS — M797 Fibromyalgia: Secondary | ICD-10-CM | POA: Diagnosis not present

## 2024-01-20 DIAGNOSIS — R2681 Unsteadiness on feet: Secondary | ICD-10-CM | POA: Diagnosis not present

## 2024-01-20 DIAGNOSIS — R262 Difficulty in walking, not elsewhere classified: Secondary | ICD-10-CM | POA: Diagnosis not present

## 2024-01-20 DIAGNOSIS — I10 Essential (primary) hypertension: Secondary | ICD-10-CM | POA: Diagnosis not present

## 2024-01-21 DIAGNOSIS — M797 Fibromyalgia: Secondary | ICD-10-CM | POA: Diagnosis not present

## 2024-01-21 DIAGNOSIS — R55 Syncope and collapse: Secondary | ICD-10-CM | POA: Diagnosis not present

## 2024-01-21 DIAGNOSIS — M6281 Muscle weakness (generalized): Secondary | ICD-10-CM | POA: Diagnosis not present

## 2024-01-21 DIAGNOSIS — I10 Essential (primary) hypertension: Secondary | ICD-10-CM | POA: Diagnosis not present

## 2024-01-21 DIAGNOSIS — R2681 Unsteadiness on feet: Secondary | ICD-10-CM | POA: Diagnosis not present

## 2024-01-21 DIAGNOSIS — R262 Difficulty in walking, not elsewhere classified: Secondary | ICD-10-CM | POA: Diagnosis not present

## 2024-01-23 ENCOUNTER — Ambulatory Visit: Admitting: Psychology

## 2024-01-23 DIAGNOSIS — M6281 Muscle weakness (generalized): Secondary | ICD-10-CM | POA: Diagnosis not present

## 2024-01-23 DIAGNOSIS — R262 Difficulty in walking, not elsewhere classified: Secondary | ICD-10-CM | POA: Diagnosis not present

## 2024-01-23 DIAGNOSIS — R55 Syncope and collapse: Secondary | ICD-10-CM | POA: Diagnosis not present

## 2024-01-23 DIAGNOSIS — M797 Fibromyalgia: Secondary | ICD-10-CM | POA: Diagnosis not present

## 2024-01-23 DIAGNOSIS — R2681 Unsteadiness on feet: Secondary | ICD-10-CM | POA: Diagnosis not present

## 2024-01-23 DIAGNOSIS — I10 Essential (primary) hypertension: Secondary | ICD-10-CM | POA: Diagnosis not present

## 2024-01-23 NOTE — Progress Notes (Unsigned)
 [name]

## 2024-01-24 ENCOUNTER — Ambulatory Visit: Admitting: Internal Medicine

## 2024-01-24 ENCOUNTER — Encounter: Payer: Self-pay | Admitting: Internal Medicine

## 2024-01-24 VITALS — BP 138/88 | HR 88 | Temp 98.3°F | Ht 62.0 in | Wt 173.4 lb

## 2024-01-24 DIAGNOSIS — R270 Ataxia, unspecified: Secondary | ICD-10-CM

## 2024-01-24 DIAGNOSIS — N1832 Chronic kidney disease, stage 3b: Secondary | ICD-10-CM | POA: Diagnosis not present

## 2024-01-24 DIAGNOSIS — R296 Repeated falls: Secondary | ICD-10-CM

## 2024-01-24 DIAGNOSIS — F419 Anxiety disorder, unspecified: Secondary | ICD-10-CM

## 2024-01-24 DIAGNOSIS — F4322 Adjustment disorder with anxiety: Secondary | ICD-10-CM

## 2024-01-24 DIAGNOSIS — F319 Bipolar disorder, unspecified: Secondary | ICD-10-CM

## 2024-01-24 DIAGNOSIS — R1084 Generalized abdominal pain: Secondary | ICD-10-CM | POA: Diagnosis not present

## 2024-01-24 DIAGNOSIS — R61 Generalized hyperhidrosis: Secondary | ICD-10-CM | POA: Diagnosis not present

## 2024-01-24 MED ORDER — METAMUCIL FIBER 2 G PO CHEW: 2.0000 | CHEWABLE_TABLET | Freq: Two times a day (BID) | ORAL | 11 refills | Status: AC

## 2024-01-24 MED ORDER — ALPRAZOLAM 1 MG PO TABS: 1.0000 mg | ORAL_TABLET | Freq: Three times a day (TID) | ORAL | 5 refills | Status: AC

## 2024-01-24 MED ORDER — POLYSACCHARIDE IRON COMPLEX 150 MG PO CAPS: 150.0000 mg | ORAL_CAPSULE | Freq: Every day | ORAL | 11 refills | Status: DC

## 2024-01-24 MED ORDER — FLUOXETINE HCL 40 MG PO CAPS: 40.0000 mg | ORAL_CAPSULE | Freq: Every day | ORAL | 11 refills | Status: AC

## 2024-01-24 MED ORDER — ACIDOPHILUS PO CAPS: 1.0000 | ORAL_CAPSULE | Freq: Every day | ORAL | 11 refills | Status: DC

## 2024-01-24 MED ORDER — NYSTATIN 100000 UNIT/GM EX POWD: CUTANEOUS | 5 refills | Status: AC

## 2024-01-24 MED ORDER — VITAMIN B-12 1000 MCG PO TABS: 1000.0000 ug | ORAL_TABLET | Freq: Every day | ORAL | 11 refills | Status: AC

## 2024-01-24 MED ORDER — LOPERAMIDE HCL 2 MG PO TABS: 2.0000 mg | ORAL_TABLET | Freq: Four times a day (QID) | ORAL | 11 refills | Status: AC | PRN

## 2024-01-24 MED ORDER — OMEGA-3-ACID ETHYL ESTERS 1 G PO CAPS: 2.0000 | ORAL_CAPSULE | Freq: Two times a day (BID) | ORAL | 11 refills | Status: AC

## 2024-01-24 MED ORDER — VITAMIN C 500 MG PO TABS: 500.0000 mg | ORAL_TABLET | Freq: Every day | ORAL | 11 refills | Status: AC

## 2024-01-24 MED ORDER — SALINE SPRAY 0.65 % NA SOLN: 1.0000 | Freq: Every day | NASAL | 1 refills | Status: AC

## 2024-01-24 MED ORDER — D3 5000 125 MCG (5000 UT) PO CAPS: 5000.0000 [IU] | ORAL_CAPSULE | Freq: Every day | ORAL | 11 refills | Status: AC

## 2024-01-24 MED ORDER — D-MANNOSE 500 MG PO CAPS: 500.0000 mg | ORAL_CAPSULE | Freq: Two times a day (BID) | ORAL | 11 refills | Status: AC

## 2024-01-24 NOTE — Assessment & Plan Note (Signed)
 D/c Auvelity  Back on Prozac  40 mg/d

## 2024-01-24 NOTE — Assessment & Plan Note (Signed)
In PT 

## 2024-01-24 NOTE — Assessment & Plan Note (Signed)
 Hydrate well

## 2024-01-24 NOTE — Assessment & Plan Note (Signed)
 Nystatin  powder under breasts qd

## 2024-01-24 NOTE — Assessment & Plan Note (Signed)
 IBS - D  Gluten free diet did not help Trial of Creon  On whole food diet Imodium  ID

## 2024-01-24 NOTE — Progress Notes (Signed)
 Subjective:  Patient ID: [name], female    DOB: 1950/10/02  Age: 73 y.o. MRN: [id number]  CC: Medical Management of Chronic Issues (Patient here to get medications printed out to be sent to nursing home since they are holding some of her medications. Patient also needs a note stating hwy she needs xanaxk 3x a day since RN at nursing home is saying she doesn't need to be on it that much. )   HPI [name] presents for Medical Management of Chronic Issues (Patient here to get medications printed out to be sent to nursing home since they are holding some of her medications. Patient also needs a note stating hwy she needs xanaxk 3x a day since RN at nursing home is saying she doesn't need to be on it that much.   Outpatient Medications Prior to Visit  Medication Sig Dispense Refill   acetaminophen  (TYLENOL ) 500 MG tablet Take 1,000 mg by mouth at bedtime.     acetaminophen  (TYLENOL ) 500 MG tablet Take 500 mg by mouth every 4 (four) hours as needed (Patient reported 2 in the morning 2 at night.).     aspirin  EC (ASPIRIN  LOW DOSE) 81 MG tablet Take 1 tablet (81 mg total) by mouth daily. Swallow whole. 30 tablet 3   busPIRone  (BUSPAR ) 30 MG tablet Take 1 tablet (30 mg total) by mouth 2 (two) times daily. 60 tablet 3   carvedilol  (COREG ) 12.5 MG tablet Take 1 tablet (12.5 mg total) by mouth every evening. 90 tablet 3   carvedilol  (COREG ) 25 MG tablet Take 1 tablet (25 mg total) by mouth in the morning. 90 tablet 3   ciprofloxacin  (CIPRO ) 250 MG tablet Take 1 tablet (250 mg total) by mouth 2 (two) times daily. (Patient not taking: Reported on 01/24/2024) 14 tablet 2   cloNIDine  (CATAPRES ) 0.2 MG tablet Take 1 tablet (0.2 mg total) by mouth at bedtime. 90 tablet 3   colesevelam  (WELCHOL ) 625 MG tablet Take 2 tablets (1,250 mg total) by mouth 2 (two) times daily with a meal. 360 tablet 3   esomeprazole  (NEXIUM ) 40 MG capsule Take 1 capsule (40 mg total) by mouth every morning. 90  capsule 3   hydrALAZINE  (APRESOLINE ) 10 MG tablet Take 1 tablet by mouth 3 times daily. MAY TAKE EXTRA 10MG  FOR SBP>160 120 tablet 9   ipratropium-albuterol  (DUONEB) 0.5-2.5 (3) MG/3ML SOLN Take 3 mLs by nebulization every 6 (six) hours as needed (Asthma). 360 mL 3   levothyroxine  (SYNTHROID ) 100 MCG tablet Take 1 tablet (100 mcg total) by mouth daily before breakfast. 30 tablet 11   mirtazapine  (REMERON ) 30 MG tablet Take 1 tablet (30 mg total) by mouth at bedtime. 30 tablet 5   OVER THE COUNTER MEDICATION Take 1 capsule by mouth daily. 11 MUSHROOM BLEND     Polyethyl Glycol-Propyl Glycol (SYSTANE) 0.4-0.3 % SOLN Place 1 drop into both eyes 3 (three) times daily as needed (dry eyes). Place 1 drop into both eyes 3 (three) times daily as needed (dry eyes). 15 mL 2   rosuvastatin  (CRESTOR ) 10 MG tablet Take 1 tablet (10 mg total) by mouth daily. 90 tablet 3   traMADol  (ULTRAM ) 50 MG tablet Take 1 tablet (50 mg total) by mouth every 6 (six) hours as needed for severe pain (pain score 7-10). 120 tablet 2   ALPRAZolam  (XANAX ) 1 MG tablet Take 1 tablet (1 mg total) by mouth 3 (three) times daily as needed for anxiety. 90 tablet 1  ascorbic acid  (VITAMIN C ) 500 MG tablet Take 1 tablet (500 mg total) by mouth daily. 100 tablet 3   Cholecalciferol  (D3 5000) 125 MCG (5000 UT) capsule Take 1 capsule (5,000 Units total) by mouth daily. 100 capsule 3   cyanocobalamin  (VITAMIN B12) 1000 MCG tablet Take 1 tablet (1,000 mcg total) by mouth daily. Take 1,000 mcg by mouth daily. 100 tablet 3   D-Mannose 500 MG CAPS Take 500 mg by mouth 2 (two) times daily. WITH CRANBERRY & DANDELION EXTRACT 180 capsule 3   Dextromethorphan -buPROPion  ER (AUVELITY ) 45-105 MG TBCR Take 1 tablet by mouth in the morning 60 tablet 0   Dextromethorphan -buPROPion  ER (AUVELITY ) 45-105 MG TBCR Take 1 tablet by mouth daily. Patient reports prescribed by Dr. Tasia and approved by Dr. Graiden Henes 30 tablet 1   Dextromethorphan -buPROPion  ER  (AUVELITY ) 45-105 MG TBCR Take 2 tablets by mouth every morning. 60 tablet 1   iron  polysaccharides (POLY-IRON  150) 150 MG capsule Take 1 capsule (150 mg total) by mouth daily. 90 capsule 3   Lactobacillus (ACIDOPHILUS) CAPS capsule Take 1 capsule by mouth daily. 100 capsule 3   loperamide  (IMODIUM  A-D) 2 MG tablet Take 1 tablet (2 mg total) by mouth 4 (four) times daily as needed for diarrhea or loose stools. 30 tablet 3   Metamucil Fiber 2 g CHEW Chew 2 tablets by mouth in the morning and at bedtime. 100 tablet 11   omega-3 acid ethyl esters (LOVAZA ) 1 g capsule Take 2 capsules (2 g total) by mouth 2 (two) times daily. 120 capsule 11   sodium chloride  (OCEAN) 0.65 % SOLN nasal spray Place 1 spray into both nostrils as needed for congestion. 30 mL 1   No facility-administered medications prior to visit.    ROS: Review of Systems  Constitutional:  Positive for fatigue. Negative for activity change, appetite change, chills and unexpected weight change.  HENT:  Negative for congestion, mouth sores and sinus pressure.   Eyes:  Negative for visual disturbance.  Respiratory:  Negative for cough and chest tightness.   Gastrointestinal:  Negative for abdominal pain and nausea.  Genitourinary:  Negative for difficulty urinating, frequency and vaginal pain.  Musculoskeletal:  Positive for arthralgias. Negative for back pain and gait problem.  Skin:  Negative for pallor and rash.  Neurological:  Positive for weakness. Negative for dizziness, tremors, numbness and headaches.  Psychiatric/Behavioral:  Positive for decreased concentration and dysphoric mood. Negative for confusion, hallucinations, sleep disturbance and suicidal ideas. The patient is nervous/anxious.     Objective:  BP 138/88   Pulse 88   Temp 98.3 F (36.8 C) (Oral)   Ht 5' 2 (1.575 m)   Wt 173 lb 6.4 oz (78.7 kg)   SpO2 97%   BMI 31.72 kg/m   BP Readings from Last 3 Encounters:  01/24/24 138/88  01/03/24 128/84  11/14/23  (!) 146/98    Wt Readings from Last 3 Encounters:  01/24/24 173 lb 6.4 oz (78.7 kg)  11/14/23 170 lb (77.1 kg)  10/24/23 172 lb 9.6 oz (78.3 kg)    Physical Exam Constitutional:      General: She is not in acute distress.    Appearance: She is well-developed. She is obese.  HENT:     Head: Normocephalic.     Right Ear: External ear normal.     Left Ear: External ear normal.     Nose: Nose normal.  Eyes:     General:        Right  eye: No discharge.        Left eye: No discharge.     Conjunctiva/sclera: Conjunctivae normal.     Pupils: Pupils are equal, round, and reactive to light.  Neck:     Thyroid : No thyromegaly.     Vascular: No JVD.     Trachea: No tracheal deviation.  Cardiovascular:     Rate and Rhythm: Normal rate and regular rhythm.     Heart sounds: Normal heart sounds.  Pulmonary:     Effort: No respiratory distress.     Breath sounds: No stridor. No wheezing.  Abdominal:     General: Bowel sounds are normal. There is no distension.     Palpations: Abdomen is soft. There is no mass.     Tenderness: There is no abdominal tenderness. There is no guarding or rebound.  Musculoskeletal:        General: No tenderness.     Cervical back: Normal range of motion and neck supple. No rigidity.  Lymphadenopathy:     Cervical: No cervical adenopathy.  Skin:    Findings: No erythema or rash.  Neurological:     Cranial Nerves: No cranial nerve deficit.     Motor: No abnormal muscle tone.     Coordination: Coordination abnormal.     Gait: Gait abnormal.     Deep Tendon Reflexes: Reflexes normal.  Psychiatric:        Behavior: Behavior normal.        Thought Content: Thought content normal.    Walking better.  [name] is in a better mood.  She is using a walker.  However, her balance is better She is here with her son Lab Results  Component Value Date   WBC 7.2 01/03/2024   HGB 12.4 01/03/2024   HCT 36.6 01/03/2024   PLT 353.0 01/03/2024   GLUCOSE 100 (H)  01/03/2024   CHOL 277 (H) 09/23/2022   TRIG 276 (H) 09/23/2022   HDL 51 09/23/2022   LDLDIRECT 64 11/14/2023   LDLCALC 174 (H) 09/23/2022   ALT 14 01/03/2024   AST 16 01/03/2024   NA 137 01/03/2024   K 4.4 01/03/2024   CL 104 01/03/2024   CREATININE 1.27 (H) 01/03/2024   BUN 32 (H) 01/03/2024   CO2 24 01/03/2024   TSH 3.90 01/03/2024   INR 1.0 07/15/2020   HGBA1C 6.0 01/03/2024    CT HEAD WO CONTRAST ( ) Result Date: 01/17/2024 EXAM: CT HEAD WITHOUT CONTRAST 01/06/2024 02:49:01 PM TECHNIQUE: CT of the head was performed without the administration of intravenous contrast. Automated exposure control, iterative reconstruction, and/or weight based adjustment of the mA/kV was utilized to reduce the radiation dose to as low as reasonably achievable. COMPARISON: 09/27/2022 CLINICAL HISTORY: Memory loss; Confusion. Thx. Leiomyosarcoma, chemo and radiation, blurred vision, memory issues, loss of balance with falls x 4-6 weeks. FINDINGS: BRAIN AND VENTRICLES: No acute hemorrhage. No evidence of acute infarct. No hydrocephalus. No extra-axial collection. No mass effect or midline shift. ORBITS: No acute abnormality. SINUSES: No acute abnormality. SOFT TISSUES AND SKULL: No acute soft tissue abnormality. No skull fracture. IMPRESSION: 1. No acute intracranial abnormality. Electronically signed by: Franky Stanford MD 01/17/2024 02:14 AM EDT RP Workstation: [id number]    Assessment & Plan:   Problem List Items Addressed This Visit     Abdominal pain   IBS - D  Gluten free diet did not help Trial of Creon  On whole food diet Imodium  ID      Adjustment disorder with  anxiety   Doing better after her moved to [facility]      Anxiety - Primary   Doing better after her moved to [facility] Restart Xanax  as before      Relevant Medications   ALPRAZolam  (XANAX ) 1 MG tablet   FLUoxetine  (PROZAC ) 40 MG capsule   Ataxia   In PT      Bipolar I disorder (HCC)   D/c Auvelity  Back on Prozac  40  mg/d      CKD stage 3b, GFR 30-44 ml/min (HCC)   Hydrate well      Diaphoresis   Nystatin  powder under breasts qd      Falls frequently   Much better with physical therapy at [facility] ordered this encounter  Medications   ALPRAZolam  (XANAX ) 1 MG tablet    Sig: Take 1 tablet (1 mg total) by mouth 3 (three) times daily.    Dispense:  90 tablet    Refill:  5   ascorbic acid  (VITAMIN C ) 500 MG tablet    Sig: Take 1 tablet (500 mg total) by mouth daily.    Dispense:  30 tablet    Refill:  11   Cholecalciferol  (D3 5000) 125 MCG (5000 UT) capsule    Sig: Take 1 capsule (5,000 Units total) by mouth daily.    Dispense:  30 capsule    Refill:  11   cyanocobalamin  (VITAMIN B12) 1000 MCG tablet    Sig: Take 1 tablet (1,000 mcg total) by mouth daily. Take 1,000 mcg by mouth daily.    Dispense:  30 tablet    Refill:  11   D-Mannose 500 MG CAPS    Sig: Take 1 capsule (500 mg total) by mouth 2 (two) times daily. WITH CRANBERRY & DANDELION EXTRACT    Dispense:  60 capsule    Refill:  11   iron  polysaccharides (POLY-IRON  150) 150 MG capsule    Sig: Take 1 capsule (150 mg total) by mouth daily.    Dispense:  30 capsule    Refill:  11   Lactobacillus (ACIDOPHILUS) CAPS capsule    Sig: Take 1 capsule by mouth daily.    Dispense:  30 capsule    Refill:  11   loperamide  (IMODIUM  A-D) 2 MG tablet    Sig: Take 1 tablet (2 mg total) by mouth 4 (four) times daily as needed for diarrhea or loose stools.    Dispense:  100 tablet    Refill:  11   Metamucil Fiber 2 g CHEW    Sig: Chew 2 tablets by mouth in the morning and at bedtime.    Dispense:  120 tablet    Refill:  11   omega-3 acid ethyl esters (LOVAZA ) 1 g capsule    Sig: Take 2 capsules (2 g total) by mouth 2 (two) times daily.    Dispense:  120 capsule    Refill:  11   sodium chloride  (OCEAN) 0.65 % SOLN nasal spray    Sig: Place 1 spray into both nostrils daily.    Dispense:  30 mL    Refill:  1   FLUoxetine   (PROZAC ) 40 MG capsule    Sig: Take 1 capsule (40 mg total) by mouth daily.    Dispense:  30 capsule    Refill:  11   nystatin  (MYCOSTATIN /NYSTOP ) powder    Sig: Use every day on rash    Dispense:  60 g    Refill:  5      Follow-up: Return for a follow-up visit.  Marolyn Noel, MD

## 2024-01-25 DIAGNOSIS — R55 Syncope and collapse: Secondary | ICD-10-CM | POA: Diagnosis not present

## 2024-01-25 DIAGNOSIS — M797 Fibromyalgia: Secondary | ICD-10-CM | POA: Diagnosis not present

## 2024-01-25 DIAGNOSIS — I10 Essential (primary) hypertension: Secondary | ICD-10-CM | POA: Diagnosis not present

## 2024-01-25 DIAGNOSIS — M6281 Muscle weakness (generalized): Secondary | ICD-10-CM | POA: Diagnosis not present

## 2024-01-25 DIAGNOSIS — R2681 Unsteadiness on feet: Secondary | ICD-10-CM | POA: Diagnosis not present

## 2024-01-25 DIAGNOSIS — R262 Difficulty in walking, not elsewhere classified: Secondary | ICD-10-CM | POA: Diagnosis not present

## 2024-01-26 DIAGNOSIS — R2681 Unsteadiness on feet: Secondary | ICD-10-CM | POA: Diagnosis not present

## 2024-01-26 DIAGNOSIS — M6281 Muscle weakness (generalized): Secondary | ICD-10-CM | POA: Diagnosis not present

## 2024-01-26 DIAGNOSIS — I10 Essential (primary) hypertension: Secondary | ICD-10-CM | POA: Diagnosis not present

## 2024-01-26 DIAGNOSIS — M797 Fibromyalgia: Secondary | ICD-10-CM | POA: Diagnosis not present

## 2024-01-26 DIAGNOSIS — R262 Difficulty in walking, not elsewhere classified: Secondary | ICD-10-CM | POA: Diagnosis not present

## 2024-01-26 DIAGNOSIS — R55 Syncope and collapse: Secondary | ICD-10-CM | POA: Diagnosis not present

## 2024-01-28 DIAGNOSIS — R2681 Unsteadiness on feet: Secondary | ICD-10-CM | POA: Diagnosis not present

## 2024-01-28 DIAGNOSIS — M6281 Muscle weakness (generalized): Secondary | ICD-10-CM | POA: Diagnosis not present

## 2024-01-28 DIAGNOSIS — I10 Essential (primary) hypertension: Secondary | ICD-10-CM | POA: Diagnosis not present

## 2024-01-28 DIAGNOSIS — R262 Difficulty in walking, not elsewhere classified: Secondary | ICD-10-CM | POA: Diagnosis not present

## 2024-01-28 DIAGNOSIS — R55 Syncope and collapse: Secondary | ICD-10-CM | POA: Diagnosis not present

## 2024-01-28 DIAGNOSIS — M797 Fibromyalgia: Secondary | ICD-10-CM | POA: Diagnosis not present

## 2024-01-30 DIAGNOSIS — R262 Difficulty in walking, not elsewhere classified: Secondary | ICD-10-CM | POA: Diagnosis not present

## 2024-01-30 DIAGNOSIS — M797 Fibromyalgia: Secondary | ICD-10-CM | POA: Diagnosis not present

## 2024-01-30 DIAGNOSIS — I10 Essential (primary) hypertension: Secondary | ICD-10-CM | POA: Diagnosis not present

## 2024-01-30 DIAGNOSIS — R2681 Unsteadiness on feet: Secondary | ICD-10-CM | POA: Diagnosis not present

## 2024-01-30 DIAGNOSIS — M6281 Muscle weakness (generalized): Secondary | ICD-10-CM | POA: Diagnosis not present

## 2024-01-30 DIAGNOSIS — R55 Syncope and collapse: Secondary | ICD-10-CM | POA: Diagnosis not present

## 2024-01-31 ENCOUNTER — Ambulatory Visit (INDEPENDENT_AMBULATORY_CARE_PROVIDER_SITE_OTHER): Admitting: Psychology

## 2024-01-31 DIAGNOSIS — F4323 Adjustment disorder with mixed anxiety and depressed mood: Secondary | ICD-10-CM

## 2024-01-31 DIAGNOSIS — F4321 Adjustment disorder with depressed mood: Secondary | ICD-10-CM | POA: Diagnosis not present

## 2024-01-31 NOTE — Progress Notes (Signed)
 [name] is a 73 y.o. female patient  Date: 01/31/2024  Treatment Plan Diagnosis F43.21 (Adjustment Disorder with mixed anxiety and depressed mood.  Symptoms A diagnosis of an acute, serious illness that is life-threatening. (Status: maintained) -- No Description Entered  Diminished interest in or enjoyment of activities. (Status: maintained) -- No Description Entered  Lack of energy. (Status: maintained) -- No Description Entered  Sad affect, social withdrawal, anxiety, loss of interest in activities, and low energy. (Status: maintained) -- No Description Entered  Medication Status compliance  Safety unspecified If Suicidal or Homicidal State Action Taken: unspecified  Current Risk:  Medications Buspar  (Dosage: 30mg  2X/day)  New Medication (Dosage: 250mg  2x/day)  New Medication (Dosage: 250mg  2x/day)  Prozac  (Dosage: 40mg  daily)  Remeron  (Dosage: 30mg  nightly)  xanax  (Dosage: .5mg  2-3X/day)  Objectives Related Problem: Recognize, accept, and cope with feelings of depression. Description: Identify and replace thoughts and beliefs that support depression. Target Date: 2024-04-28 Frequency: Daily Modality: individual Progress: 80%  Related Problem: Recognize, accept, and cope with feelings of depression. Description: Verbalize an understanding of healthy and unhealthy emotions with the intent of increasing the use of healthy emotions to guide actions. Target Date: 2024-04-28 Frequency: Daily Modality: individual Progress: 85%  Related Problem: Recognize, accept, and cope with feelings of depression. Description: Increasingly verbalize hopeful and positive statements regarding self, others, and the future. Target Date: 2022-04-28 Frequency: Daily Modality: individual Progress: 100%  Related Problem: Reduce fear, anxiety, and  worry associated with the medical condition. Description: Share with significant others efforts to adapt successfully to the medical condition/diagnosis. Target Date: 2024-04-28 Frequency: Daily Modality: individual Progress: 85%  Related Problem: Reduce fear, anxiety, and worry associated with the medical condition. Description: Engage in social, productive, and recreational activities that are possible in spite of medical condition. Target Date: 2024-04-28 Frequency: Daily Modality: individual Progress: 90%  Related Problem: Reduce fear, anxiety, and worry associated with the medical condition. Description: Identify the coping skills and sources of emotional support that have been beneficial in the past. Target Date: 2024-04-28 Frequency: Daily Modality: individual Progress: 90%  Client Response full compliance  Service Location Location, [address] [place], [place] [address]  Service Code cpt [phone]  Related past to present  Facilitate problem solving  Identify/label emotions  Validate/empathize  Self care activities  Lifestyle change (exercise, nutrition)  Assess/facilitate readiness to change  Relaxation training  Rationally challenge thoughts or beliefs/cognitive restructuring  Comments  Dx: F43.21  Meds: Prozac , Buspar  and Remeron .  Goals: Following the failure of a third marriage, patient is struggling to rebuild her life and adjust to her new circumstances. She suffers a negative self-concept and has little confidence in her judgement. She is seeking to reduce symptoms of anxiety and self-doubt and to re-define satisfa ction moving forward. Therapy will focus on cognitive restructuring to redefine her experiences and focus on those satisfying aspects of her life. Will target this goal completion for April1, 2021 (completed). She is also wanting to utilize counseling to adjust and accept her latest diagnosis of cancer. She wants assistance to better manage this  news and remain positive throughout treatment. Goal date is 12-22. Patient has made progress, but her medical condition is variable and she requests additional treatment to manage moods throughout her medical ordeal. In addition, she is trying to manage feelings associated with her older son's mental health struggles. Revised goal date is 12-25.   Patient agrees to a video session and is aware of the limitations of this platform. She is at home and I am in home office.   [name]: She was able to find a private place to speak for the session. Says it is a big adjustment to be at this facility. She feels she is half way there. She seems more settled and is less agitated than last session. She feels it will work out, but the process of getting used to these new surroundings takes more time. Says she is given the feedback that she talks too much. She reports that she understands the feedback, but it is that she is so social. The complaint is that she gets into others personal business and too involved. She will try to restrain. Also reports that her son [name] is going to try to change her Medicaid status and get her transferred to a private room, which is more preferable.                                                                                                                                                                                                                                                                                                                                      Shylee Durrett LEWIS Time: 2:15p-3:00p 45 minutes

## 2024-02-01 ENCOUNTER — Ambulatory Visit: Admitting: Psychology

## 2024-02-05 NOTE — Assessment & Plan Note (Signed)
 Doing better after her moved to [facility] Restart Xanax  as before

## 2024-02-05 NOTE — Assessment & Plan Note (Signed)
 Doing better after her moved to [facility]

## 2024-02-05 NOTE — Assessment & Plan Note (Signed)
 Much better with physical therapy at [facility]

## 2024-02-06 ENCOUNTER — Ambulatory Visit: Admitting: Psychology

## 2024-02-09 ENCOUNTER — Ambulatory Visit (INDEPENDENT_AMBULATORY_CARE_PROVIDER_SITE_OTHER): Admitting: Internal Medicine

## 2024-02-09 ENCOUNTER — Encounter: Payer: Self-pay | Admitting: Internal Medicine

## 2024-02-09 ENCOUNTER — Telehealth: Payer: Self-pay

## 2024-02-09 VITALS — BP 138/76 | HR 65 | Resp 20 | Ht 62.0 in | Wt 168.4 lb

## 2024-02-09 DIAGNOSIS — N1832 Chronic kidney disease, stage 3b: Secondary | ICD-10-CM

## 2024-02-09 DIAGNOSIS — F419 Anxiety disorder, unspecified: Secondary | ICD-10-CM | POA: Diagnosis not present

## 2024-02-09 DIAGNOSIS — N3000 Acute cystitis without hematuria: Secondary | ICD-10-CM | POA: Diagnosis not present

## 2024-02-09 DIAGNOSIS — R300 Dysuria: Secondary | ICD-10-CM | POA: Diagnosis not present

## 2024-02-09 DIAGNOSIS — E039 Hypothyroidism, unspecified: Secondary | ICD-10-CM | POA: Diagnosis not present

## 2024-02-09 LAB — COMPREHENSIVE METABOLIC PANEL WITH GFR
ALT: 15 U/L (ref 0–35)
AST: 22 U/L (ref 0–37)
Albumin: 4.7 g/dL (ref 3.5–5.2)
Alkaline Phosphatase: 85 U/L (ref 39–117)
BUN: 20 mg/dL (ref 6–23)
CO2: 24 meq/L (ref 19–32)
Calcium: 9.3 mg/dL (ref 8.4–10.5)
Chloride: 103 meq/L (ref 96–112)
Creatinine, Ser: 1.21 mg/dL — ABNORMAL HIGH (ref 0.40–1.20)
GFR: 44.29 mL/min — ABNORMAL LOW (ref >60.00)
Glucose, Bld: 92 mg/dL (ref 70–99)
Potassium: 3.9 meq/L (ref 3.5–5.1)
Sodium: 138 meq/L (ref 135–145)
Total Bilirubin: 0.5 mg/dL (ref 0.2–1.2)
Total Protein: 7.4 g/dL (ref 6.0–8.3)

## 2024-02-09 LAB — URINALYSIS, ROUTINE W REFLEX MICROSCOPIC
Bilirubin Urine: NEGATIVE
Hgb urine dipstick: NEGATIVE
Ketones, ur: NEGATIVE
Nitrite: POSITIVE — AB
Specific Gravity, Urine: <=1.005 — AB (ref 1.000–1.030)
Total Protein, Urine: NEGATIVE
Urine Glucose: NEGATIVE
Urobilinogen, UA: 0.2 (ref 0.0–1.0)
pH: 6 (ref 5.0–8.0)

## 2024-02-09 MED ORDER — CIPROFLOXACIN HCL 250 MG PO TABS: 250.0000 mg | ORAL_TABLET | Freq: Two times a day (BID) | ORAL | 2 refills | Status: DC

## 2024-02-09 NOTE — Assessment & Plan Note (Signed)
 On Levothroid

## 2024-02-09 NOTE — Assessment & Plan Note (Signed)
Recurrent L flank pain, chronic dysuria Recent PET CT w/L nephrolithiasis

## 2024-02-09 NOTE — Addendum Note (Signed)
 Addended by: CLAUDENE BOBBETTE RAMAN on: 02/09/2024 03:02 PM   Modules accepted: Orders

## 2024-02-09 NOTE — Telephone Encounter (Signed)
 Patient asked for FL2 to be completed after visit.   She was instructed by front desk to bring/leave a copy of her former FL2 form to be completed and that the form would be ready in 7-10 business days from the time she leaves previous FL2 form.

## 2024-02-09 NOTE — Assessment & Plan Note (Signed)
 Check UA, Cx Recurrent UTIs Cipro  500 mg bid x 7 d

## 2024-02-09 NOTE — Assessment & Plan Note (Signed)
 Hydrate well

## 2024-02-09 NOTE — Progress Notes (Signed)
 Subjective:  Patient ID: [name], female    DOB: 05/24/1950  Age: 73 y.o. MRN: [id number]  CC: Follow-up (Needs new FL2) and Dysuria (Burning and frequency for several days)   HPI [name] presents for probable UTI; having UTI sx's C/o anxiety, CRI, dyslipidemia She is here w/her son    Outpatient Medications Prior to Visit  Medication Sig Dispense Refill   acetaminophen  (TYLENOL ) 500 MG tablet Take 1,000 mg by mouth at bedtime.     acetaminophen  (TYLENOL ) 500 MG tablet Take 500 mg by mouth every 4 (four) hours as needed (Patient reported 2 in the morning 2 at night.).     ALPRAZolam  (XANAX ) 1 MG tablet Take 1 tablet (1 mg total) by mouth 3 (three) times daily. (Patient taking differently: Take 1 mg by mouth 3 (three) times daily. Home is only giving her twice daily instead of TID) 90 tablet 5   ascorbic acid  (VITAMIN C ) 500 MG tablet Take 1 tablet (500 mg total) by mouth daily. 30 tablet 11   aspirin  EC (ASPIRIN  LOW DOSE) 81 MG tablet Take 1 tablet (81 mg total) by mouth daily. Swallow whole. 30 tablet 3   busPIRone  (BUSPAR ) 30 MG tablet Take 1 tablet (30 mg total) by mouth 2 (two) times daily. 60 tablet 3   carvedilol  (COREG ) 12.5 MG tablet Take 1 tablet (12.5 mg total) by mouth every evening. 90 tablet 3   carvedilol  (COREG ) 25 MG tablet Take 1 tablet (25 mg total) by mouth in the morning. 90 tablet 3   Cholecalciferol  (D3 5000) 125 MCG (5000 UT) capsule Take 1 capsule (5,000 Units total) by mouth daily. 30 capsule 11   cloNIDine  (CATAPRES ) 0.2 MG tablet Take 1 tablet (0.2 mg total) by mouth at bedtime. 90 tablet 3   colesevelam  (WELCHOL ) 625 MG tablet Take 2 tablets (1,250 mg total) by mouth 2 (two) times daily with a meal. 360 tablet 3   cyanocobalamin  (VITAMIN B12) 1000 MCG tablet Take 1 tablet (1,000 mcg total) by mouth daily. Take 1,000 mcg by mouth daily. 30 tablet 11   esomeprazole  (NEXIUM ) 40 MG capsule Take 1 capsule (40 mg total) by mouth every  morning. 90 capsule 3   FLUoxetine  (PROZAC ) 40 MG capsule Take 1 capsule (40 mg total) by mouth daily. 30 capsule 11   hydrALAZINE  (APRESOLINE ) 10 MG tablet Take 1 tablet by mouth 3 times daily. MAY TAKE EXTRA 10MG  FOR SBP>160 120 tablet 9   iron  polysaccharides (POLY-IRON  150) 150 MG capsule Take 1 capsule (150 mg total) by mouth daily. 30 capsule 11   Lactobacillus (ACIDOPHILUS) CAPS capsule Take 1 capsule by mouth daily. 30 capsule 11   levothyroxine  (SYNTHROID ) 100 MCG tablet Take 1 tablet (100 mcg total) by mouth daily before breakfast. 30 tablet 11   loperamide  (IMODIUM  A-D) 2 MG tablet Take 1 tablet (2 mg total) by mouth 4 (four) times daily as needed for diarrhea or loose stools. 100 tablet 11   Metamucil Fiber 2 g CHEW Chew 2 tablets by mouth in the morning and at bedtime. 120 tablet 11   mirtazapine  (REMERON ) 30 MG tablet Take 1 tablet (30 mg total) by mouth at bedtime. 30 tablet 5   nystatin  (MYCOSTATIN /NYSTOP ) powder Use every day on rash 60 g 5   omega-3 acid ethyl esters (LOVAZA ) 1 g capsule Take 2 capsules (2 g total) by mouth 2 (two) times daily. 120 capsule 11   Polyethyl Glycol-Propyl Glycol (SYSTANE) 0.4-0.3 % SOLN Place 1  drop into both eyes 3 (three) times daily as needed (dry eyes). Place 1 drop into both eyes 3 (three) times daily as needed (dry eyes). 15 mL 2   rosuvastatin  (CRESTOR ) 10 MG tablet Take 1 tablet (10 mg total) by mouth daily. 90 tablet 3   sodium chloride  (OCEAN) 0.65 % SOLN nasal spray Place 1 spray into both nostrils daily. 30 mL 1   traMADol  (ULTRAM ) 50 MG tablet Take 1 tablet (50 mg total) by mouth every 6 (six) hours as needed for severe pain (pain score 7-10). 120 tablet 2   D-Mannose 500 MG CAPS Take 1 capsule (500 mg total) by mouth 2 (two) times daily. WITH CRANBERRY & DANDELION EXTRACT (Patient not taking: Reported on 02/09/2024) 60 capsule 11   ipratropium-albuterol  (DUONEB) 0.5-2.5 (3) MG/3ML SOLN Take 3 mLs by nebulization every 6 (six) hours as  needed (Asthma). (Patient not taking: Reported on 02/09/2024) 360 mL 3   OVER THE COUNTER MEDICATION Take 1 capsule by mouth daily. 11 MUSHROOM BLEND     ciprofloxacin  (CIPRO ) 250 MG tablet Take 1 tablet (250 mg total) by mouth 2 (two) times daily. (Patient not taking: Reported on 01/24/2024) 14 tablet 2   No facility-administered medications prior to visit.    ROS: Review of Systems  Constitutional:  Negative for activity change, appetite change, chills, fatigue and unexpected weight change.  HENT:  Negative for congestion, mouth sores and sinus pressure.   Eyes:  Negative for visual disturbance.  Respiratory:  Negative for cough and chest tightness.   Gastrointestinal:  Negative for abdominal pain and nausea.  Genitourinary:  Positive for dysuria and urgency. Negative for difficulty urinating, frequency and vaginal pain.  Musculoskeletal:  Positive for arthralgias, back pain and gait problem.  Skin:  Negative for pallor and rash.  Neurological:  Positive for weakness. Negative for dizziness, tremors, numbness and headaches.  Hematological:  Bruises/bleeds easily.  Psychiatric/Behavioral:  Positive for dysphoric mood. Negative for confusion, sleep disturbance and suicidal ideas. The patient is nervous/anxious.     Objective:  BP 138/76 (BP Location: Left Arm, Patient Position: Sitting)   Pulse 65   Resp 20   Ht 5' 2 (1.575 m)   Wt 168 lb 6.4 oz (76.4 kg)   SpO2 97%   BMI 30.80 kg/m   BP Readings from Last 3 Encounters:  02/09/24 138/76  01/24/24 138/88  01/03/24 128/84    Wt Readings from Last 3 Encounters:  02/09/24 168 lb 6.4 oz (76.4 kg)  01/24/24 173 lb 6.4 oz (78.7 kg)  11/14/23 170 lb (77.1 kg)    Physical Exam Constitutional:      General: She is not in acute distress.    Appearance: She is well-developed. She is obese.  HENT:     Head: Normocephalic.     Right Ear: External ear normal.     Left Ear: External ear normal.     Nose: Nose normal.  Eyes:      General:        Right eye: No discharge.        Left eye: No discharge.     Conjunctiva/sclera: Conjunctivae normal.     Pupils: Pupils are equal, round, and reactive to light.  Neck:     Thyroid : No thyromegaly.     Vascular: No JVD.     Trachea: No tracheal deviation.  Cardiovascular:     Rate and Rhythm: Normal rate and regular rhythm.     Heart sounds: Normal heart sounds.  Pulmonary:     Effort: No respiratory distress.     Breath sounds: No stridor. No wheezing.  Abdominal:     General: Bowel sounds are normal. There is no distension.     Palpations: Abdomen is soft. There is no mass.     Tenderness: There is no abdominal tenderness. There is no guarding or rebound.  Musculoskeletal:        General: No tenderness.     Cervical back: Normal range of motion and neck supple. No rigidity.     Right lower leg: Edema present.     Left lower leg: Edema present.  Lymphadenopathy:     Cervical: No cervical adenopathy.  Skin:    Findings: No erythema or rash.  Neurological:     Mental Status: Mental status is at baseline.     Cranial Nerves: No cranial nerve deficit.     Motor: No abnormal muscle tone.     Coordination: Coordination abnormal.     Gait: Gait abnormal.     Deep Tendon Reflexes: Reflexes normal.  Psychiatric:        Behavior: Behavior normal.        Thought Content: Thought content normal.        Judgment: Judgment normal.   Using a walker  Lab Results  Component Value Date   WBC 7.2 01/03/2024   HGB 12.4 01/03/2024   HCT 36.6 01/03/2024   PLT 353.0 01/03/2024   GLUCOSE 100 (H) 01/03/2024   CHOL 277 (H) 09/23/2022   TRIG 276 (H) 09/23/2022   HDL 51 09/23/2022   LDLDIRECT 64 11/14/2023   LDLCALC 174 (H) 09/23/2022   ALT 14 01/03/2024   AST 16 01/03/2024   NA 137 01/03/2024   K 4.4 01/03/2024   CL 104 01/03/2024   CREATININE 1.27 (H) 01/03/2024   BUN 32 (H) 01/03/2024   CO2 24 01/03/2024   TSH 3.90 01/03/2024   INR 1.0 07/15/2020   HGBA1C 6.0  01/03/2024    CT HEAD WO CONTRAST ( ) Result Date: 01/17/2024 EXAM: CT HEAD WITHOUT CONTRAST 01/06/2024 02:49:01 PM TECHNIQUE: CT of the head was performed without the administration of intravenous contrast. Automated exposure control, iterative reconstruction, and/or weight based adjustment of the mA/kV was utilized to reduce the radiation dose to as low as reasonably achievable. COMPARISON: 09/27/2022 CLINICAL HISTORY: Memory loss; Confusion. Thx. Leiomyosarcoma, chemo and radiation, blurred vision, memory issues, loss of balance with falls x 4-6 weeks. FINDINGS: BRAIN AND VENTRICLES: No acute hemorrhage. No evidence of acute infarct. No hydrocephalus. No extra-axial collection. No mass effect or midline shift. ORBITS: No acute abnormality. SINUSES: No acute abnormality. SOFT TISSUES AND SKULL: No acute soft tissue abnormality. No skull fracture. IMPRESSION: 1. No acute intracranial abnormality. Electronically signed by: Franky Stanford MD 01/17/2024 02:14 AM EDT RP Workstation: [id number]    Assessment & Plan:   Problem List Items Addressed This Visit     Adult hypothyroidism   On Levothroid       Anxiety   CKD stage 3b, GFR 30-44 ml/min (HCC)   Hydrate well      Relevant Orders   Comprehensive metabolic panel with GFR   Urinalysis   Dysuria - Primary   Check UA, Cx Recurrent UTIs Cipro  500 mg bid x 7 d      Relevant Orders   Urinalysis   CULTURE, URINE COMPREHENSIVE   Urinalysis   UTI (urinary tract infection)   Recurrent L flank pain, chronic dysuria Recent PET CT w/L nephrolithiasis  Meds ordered this encounter  Medications   ciprofloxacin  (CIPRO ) 250 MG tablet    Sig: Take 1 tablet (250 mg total) by mouth 2 (two) times daily.    Dispense:  14 tablet    Refill:  2      Follow-up: No follow-ups on file.  Marolyn Noel, MD

## 2024-02-10 ENCOUNTER — Telehealth: Payer: Self-pay | Admitting: Radiology

## 2024-02-10 ENCOUNTER — Ambulatory Visit: Payer: Self-pay | Admitting: Internal Medicine

## 2024-02-10 NOTE — Telephone Encounter (Signed)
 Fax sent 02/10/2024

## 2024-02-10 NOTE — Telephone Encounter (Signed)
 Copied from CRM #[id number]. Topic: Clinical - Lab/Test Results >> Feb 10, 2024 10:28 AM Rosina BIRCH wrote: Reason for RMF:[id number] from [facility] called stating the patient had urine lab results done and they want those results faxed to them if possible Fax-[phone] CB [phone]

## 2024-02-11 ENCOUNTER — Emergency Department (HOSPITAL_COMMUNITY)

## 2024-02-11 ENCOUNTER — Emergency Department (HOSPITAL_COMMUNITY)
Admission: EM | Admit: 2024-02-11 | Discharge: 2024-02-11 | Disposition: A | Discharge status: Home / Self Care | Attending: Emergency Medicine | Admitting: Emergency Medicine

## 2024-02-11 DIAGNOSIS — Z87442 Personal history of urinary calculi: Secondary | ICD-10-CM | POA: Insufficient documentation

## 2024-02-11 DIAGNOSIS — E039 Hypothyroidism, unspecified: Secondary | ICD-10-CM | POA: Insufficient documentation

## 2024-02-11 DIAGNOSIS — I129 Hypertensive chronic kidney disease with stage 1 through stage 4 chronic kidney disease, or unspecified chronic kidney disease: Secondary | ICD-10-CM | POA: Insufficient documentation

## 2024-02-11 DIAGNOSIS — I6782 Cerebral ischemia: Secondary | ICD-10-CM | POA: Diagnosis not present

## 2024-02-11 DIAGNOSIS — R0789 Other chest pain: Secondary | ICD-10-CM | POA: Diagnosis not present

## 2024-02-11 DIAGNOSIS — N183 Chronic kidney disease, stage 3 unspecified: Secondary | ICD-10-CM | POA: Diagnosis not present

## 2024-02-11 DIAGNOSIS — N189 Chronic kidney disease, unspecified: Secondary | ICD-10-CM | POA: Diagnosis not present

## 2024-02-11 DIAGNOSIS — J4489 Other specified chronic obstructive pulmonary disease: Secondary | ICD-10-CM | POA: Diagnosis not present

## 2024-02-11 DIAGNOSIS — R519 Headache, unspecified: Secondary | ICD-10-CM | POA: Insufficient documentation

## 2024-02-11 DIAGNOSIS — R079 Chest pain, unspecified: Secondary | ICD-10-CM | POA: Diagnosis not present

## 2024-02-11 LAB — CBC
HCT: 36.1 % (ref 36.0–46.0)
Hemoglobin: 11.9 g/dL — ABNORMAL LOW (ref 12.0–15.0)
MCH: 31 pg (ref 26.0–34.0)
MCHC: 33 g/dL (ref 30.0–36.0)
MCV: 94 fL (ref 80.0–100.0)
Platelets: 272 K/uL (ref 150–400)
RBC: 3.84 MIL/uL — ABNORMAL LOW (ref 3.87–5.11)
RDW: 13.2 % (ref 11.5–15.5)
WBC: 6.5 K/uL (ref 4.0–10.5)
nRBC: 0 % (ref 0.0–0.2)

## 2024-02-11 LAB — BASIC METABOLIC PANEL WITH GFR
Anion gap: 13 (ref 5–15)
BUN: 17 mg/dL (ref 8–23)
CO2: 20 mmol/L — ABNORMAL LOW (ref 22–32)
Calcium: 9 mg/dL (ref 8.9–10.3)
Chloride: 107 mmol/L (ref 98–111)
Creatinine, Ser: 1.11 mg/dL — ABNORMAL HIGH (ref 0.44–1.00)
GFR, Estimated: 52 mL/min — ABNORMAL LOW (ref >60)
Glucose, Bld: 111 mg/dL — ABNORMAL HIGH (ref 70–99)
Potassium: 3.6 mmol/L (ref 3.5–5.1)
Sodium: 140 mmol/L (ref 135–145)

## 2024-02-11 LAB — TROPONIN I (HIGH SENSITIVITY): Troponin I (High Sensitivity): 5 ng/L (ref <18)

## 2024-02-11 MED ORDER — ACETAMINOPHEN 500 MG PO TABS
1000.0000 mg | ORAL_TABLET | Freq: Once | ORAL | Status: AC
  Administered 2024-02-11: 1000 mg via ORAL
  Filled 2024-02-11: qty 2

## 2024-02-11 NOTE — ED Notes (Signed)
 Called [facility] and gave an update on the patient's condition and that she was discharged. No transport service available from [facility].

## 2024-02-11 NOTE — ED Triage Notes (Signed)
 PT arrive via EMS from [facility] center  with a complaint of headache and chest pain and neck pain that started around 2100. Pt also endorses nausea within the last hour. Describes the chest pain as tightness and heaviness.

## 2024-02-11 NOTE — Discharge Instructions (Signed)
 You were evaluated in the Emergency Department and after careful evaluation, we did not find any emergent condition requiring admission or further testing in the hospital.  Your exam/testing today is overall reassuring.  Follow-up with your primary care doctor to discuss your symptoms.  Please return to the Emergency Department if you experience any worsening of your condition.   Thank you for allowing us  to be a part of your care.
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
## 2024-02-12 LAB — CULTURE, URINE COMPREHENSIVE

## 2024-02-13 NOTE — Telephone Encounter (Signed)
 I have not seen this form so I am unsure of completion on this.

## 2024-02-14 ENCOUNTER — Ambulatory Visit: Admitting: Psychology

## 2024-02-14 NOTE — Progress Notes (Unsigned)
   Charlotte Ridgel, PhD

## 2024-02-15 ENCOUNTER — Ambulatory Visit: Admitting: Psychology

## 2024-02-15 NOTE — Progress Notes (Unsigned)
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 11/14/2023 1551   LDLDIRECT 160.0 10/18/2018 1422        ECG personally reviewed by me today- EKG Interpretation Date/Time:  Thursday February 16 2024 15:02:15 EDT Ventricular Rate:  63 PR Interval:  188 QRS Duration:  82 QT Interval:  412 QTC Calculation: 421 R Axis:   38  Text Interpretation: Normal sinus rhythm Normal ECG When compared with ECG of 31-Jan-2023 11:35, PREVIOUS ECG IS PRESENT Confirmed by Emelia Hazy [phone]) on 02/16/2024 3:20:12 PM    Echocardiogram 10/15/2016  LV EF: 60% -   65%   -------------------------------------------------------------------  Indications:     Syncope 780.2.   -------------------------------------------------------------------  History:  Risk factors:  Dyslipidemia.   -------------------------------------------------------------------  Study Conclusions   - Left ventricle: The cavity size was normal. Wall thickness was    normal. Systolic function was normal. The estimated ejection    fraction was in the range of 60% to 65%. Wall motion was normal;    there were no regional wall motion abnormalities. Left    ventricular diastolic function parameters were  normal.  - Aortic valve: There was no stenosis.  - Mitral valve: There was trivial regurgitation.  - Right ventricle: The cavity size was normal. Systolic function    was normal.  - Tricuspid valve: Peak RV-RA gradient (S): 24 mm Hg.  - Pulmonary arteries: PA peak pressure: 27 mm Hg (S).  - Inferior vena cava: The vessel was normal in size. The    respirophasic diameter changes were in the normal range (= 50%),    consistent with normal central venous pressure.   Impressions:   - Normal study.   -------------------------------------------------------------------  Study data:  No prior study was available for comparison.  Study  status:  Routine.  Procedure:  Transthoracic echocardiography.  Image quality was adequate.  Study completion:  There were no  complications.          Transthoracic echocardiography.  M-mode,  complete 2D, spectral Doppler, and color Doppler.  Birthdate:  Patient birthdate: [birth year].  Age:  Patient is 73 yr old.  Sex:  Gender: female.    BMI: 24 kg/m^2.  Blood pressure:     129/85  Patient status:  Inpatient.  Study date:  Study date: 10/15/2016.  Study time: 09:02 AM.  Location:  Bedside.   -------------------------------------------------------------------   -------------------------------------------------------------------  Left ventricle:  The cavity size was normal. Wall thickness was  normal. Systolic function was normal. The estimated ejection  fraction was in the range of 60% to 65%. Wall motion was normal;  there were no regional wall motion abnormalities. The transmitral  flow pattern was normal. The deceleration time of the early  transmitral flow velocity was normal. The pulmonary vein flow  pattern was normal. The tissue Doppler parameters were normal. Left  ventricular diastolic function parameters were normal.   -------------------------------------------------------------------  Aortic valve:   Trileaflet.  Doppler:   There was no  stenosis.  There was no regurgitation.   -------------------------------------------------------------------  Aorta: Aortic root: The aortic root was normal in size.  Ascending aorta: The ascending aorta was normal in size.   -------------------------------------------------------------------  Mitral valve:   Normal thickness leaflets .  Doppler:   There was  no evidence for stenosis.   There was trivial regurgitation.  Valve area by pressure half-time: 3.67 cm^2. Indexed valve area by  pressure half-time: 2.21 cm^2/m^2.    Peak gradient (D): 8 mm Hg.    -------------------------------------------------------------------  Left atrium:  The atrium was normal in size.   -------------------------------------------------------------------  Right ventricle:  The cavity size was normal. Systolic function was  normal.   -------------------------------------------------------------------  Pulmonic valve:    Structurally normal valve.   Cusp separation was  normal.  Doppler:  Transvalvular velocity was within the normal  range. There was trivial regurgitation.   -------------------------------------------------------------------  Tricuspid valve:   Doppler:  There was trivial regurgitation.    -------------------------------------------------------------------  Right atrium:  The atrium was normal in size.   -------------------------------------------------------------------  Pericardium: There was no pericardial effusion.   -------------------------------------------------------------------  Systemic veins:  Inferior vena cava: The vessel was normal in size. The  respirophasic diameter changes were in the normal range (= 50%),  consistent with normal central venous pressure.   Cardiac catheterization 08/26/2017  Diagnostic Dominance: Right  Intervention    Assessment & Plan   1.  Essential hypertension-BP today 129/85.  BUN 17 and creatinine improved from 1.21-1.11 on  02/11/2024.   Continue  clonidine ,  hydralizine as needed Continue carvedilol  to 12.5 mg twice daily Low-sodium diet-continue Avoid caffeine, NSAIDs, stress reduction, increase physical activity as tolerated Maintain blood pressure log   Chest discomfort-no chest pain today.  Denies exertional chest pain.  Recently seen in the emergency department on 02/11/2024.  She was noted to have elevated blood pressure, anxiety, and developed chest discomfort on the way to the hospital.  Her lab work was reassuring.  Cardiac troponin was noted to be 5.  It was felt that her symptoms were related to anxiety. No plans for ischemic evaluation Patient reassured  Hyperlipidemia-LDL 64 on 11/14/2023 High-fiber diet Continue aspirin , omega-3 fatty acids, rosuvastatin   Syncope-denies further episodes.  Denies  lightheadedness, presyncope or syncope.  She previously wore cardiac event monitor 10/23.  Results unavailable. Maintain p.o. hydration-reviewed Continue to change positions slowly Continue lower extremity support stockings.  Lower extremity weakness-Notes increased lower extremity weakness since being moved to/moving to assisted living. Physical therapy for evaluation and treatment to be done at [facility].    Disposition: Follow-up with Dr. Sheena in 3-4 months.   Josefa HERO. Jalacia Mattila NP-C     02/16/2024, 3:21 PM [facility] [address] Office [phone] Fax [phone]    I spent 14 minutes examining this patient, reviewing medications, and using patient centered shared decision making involving their cardiac care.   I spent  20 minutes reviewing past medical history,  medications, and prior cardiac tests.

## 2024-02-16 ENCOUNTER — Encounter: Payer: Self-pay | Admitting: General Practice

## 2024-02-16 ENCOUNTER — Ambulatory Visit: Attending: General Practice | Admitting: General Practice

## 2024-02-16 VITALS — BP 129/85 | HR 63 | Ht 62.0 in | Wt 172.2 lb

## 2024-02-16 DIAGNOSIS — I10 Essential (primary) hypertension: Secondary | ICD-10-CM | POA: Diagnosis not present

## 2024-02-16 DIAGNOSIS — R29898 Other symptoms and signs involving the musculoskeletal system: Secondary | ICD-10-CM

## 2024-02-16 DIAGNOSIS — E7849 Other hyperlipidemia: Secondary | ICD-10-CM

## 2024-02-16 DIAGNOSIS — R0789 Other chest pain: Secondary | ICD-10-CM | POA: Diagnosis not present

## 2024-02-16 DIAGNOSIS — Z87898 Personal history of other specified conditions: Secondary | ICD-10-CM | POA: Diagnosis not present

## 2024-02-16 NOTE — Patient Instructions (Addendum)
 Medication Instructions:  Your physician recommends that you continue on your current medications as directed. Please refer to the Current Medication list given to you today.  *If you need a refill on your cardiac medications before your next appointment, please call your pharmacy*  Lab Work: None ordered If you have labs (blood work) drawn today and your tests are completely normal, you will receive your results only by: MyChart Message (if you have MyChart) OR A paper copy in the mail If you have any lab test that is abnormal or we need to change your treatment, we will call you to review the results.  Follow-Up: At [facility], you and your health needs are our priority.  As part of our continuing mission to provide you with exceptional heart care, our providers are all part of one team.  This team includes your primary Cardiologist (physician) and Advanced Practice Providers or APPs (Physician Assistants and Nurse Practitioners) who all work together to provide you with the care you need, when you need it.  Your next appointment:   3-4 month(s)  Provider:   Kardie Tobb, DO   Talk to your primary care provider regarding prescribing atarax.  Lower extremity weakness order for PT eval and treat.

## 2024-02-21 ENCOUNTER — Telehealth: Payer: Self-pay | Admitting: *Deleted

## 2024-02-21 NOTE — Progress Notes (Unsigned)
 Complex Care Management Care Guide Note  02/21/2024 Name: [name] MRN: [id number] DOB: 01/27/1951  [name] is a 73 y.o. year old female who is a primary care patient of Plotnikov, Aleksei V, MD and is actively engaged with the care management team. I reached out to [name] by phone today to assist with re-scheduling  with the RN Case Manager.  Follow up plan: Unsuccessful telephone outreach attempt made. A HIPAA compliant phone message was left for the patient providing contact information and requesting a return call.  Thedford Franks, CMA [place]  [facility] Guide Direct Dial: [phone]  Fax: [phone] Website: [email or web address]

## 2024-02-23 NOTE — Progress Notes (Signed)
 Complex Care Management Care Guide Note  02/23/2024 Name: GIULIANA HANDYSIDE MRN: [id number] DOB: [birth year]  [name] Rides is a 73 y.o. year old female who is a primary care patient of Plotnikov, Aleksei V, MD and is actively engaged with the care management team. I reached out to [name] Rides by phone today to assist with re-scheduling  with the RN Case Manager.  Follow up plan: Unsuccessful telephone outreach attempt made. A HIPAA compliant phone message was left for the patient providing contact information and requesting a return call. No further outreach attempts will be made due to inability to maintain patient contact.   Thedford Franks, CMA, Care Guide [facility]  [facility], [facility] Guide Direct Dial: [phone]  Fax: [phone] Website: [email or web address]

## 2024-02-28 ENCOUNTER — Ambulatory Visit: Admitting: Psychology

## 2024-02-29 ENCOUNTER — Ambulatory Visit: Admitting: Psychology

## 2024-02-29 ENCOUNTER — Encounter: Payer: Self-pay | Admitting: Gynecologic Oncology

## 2024-03-02 ENCOUNTER — Telehealth: Payer: Self-pay | Admitting: *Deleted

## 2024-03-02 ENCOUNTER — Inpatient Hospital Stay: Admitting: Gynecologic Oncology

## 2024-03-02 DIAGNOSIS — C55 Malignant neoplasm of uterus, part unspecified: Secondary | ICD-10-CM

## 2024-03-02 NOTE — Progress Notes (Unsigned)
 Patient canceled her appt same day

## 2024-03-02 NOTE — Telephone Encounter (Signed)
 Spoke with patient who called to reschedule her appt. Today with Dr.Tucker. Pt states she is not feeling well at all. Pt declined a phone visit at this time and was rescheduled for February 20 th. At 2:45 pm. Pt agreed to date and time and had no further concerns.

## 2024-03-05 ENCOUNTER — Ambulatory Visit: Admitting: Psychology

## 2024-03-06 ENCOUNTER — Encounter: Payer: Self-pay | Admitting: Internal Medicine

## 2024-03-06 ENCOUNTER — Ambulatory Visit: Admitting: Internal Medicine

## 2024-03-06 VITALS — BP 116/74 | HR 75 | Temp 97.5°F | Ht 62.0 in | Wt 172.4 lb

## 2024-03-06 DIAGNOSIS — N1832 Chronic kidney disease, stage 3b: Secondary | ICD-10-CM | POA: Diagnosis not present

## 2024-03-06 DIAGNOSIS — R300 Dysuria: Secondary | ICD-10-CM | POA: Diagnosis not present

## 2024-03-06 DIAGNOSIS — F419 Anxiety disorder, unspecified: Secondary | ICD-10-CM | POA: Diagnosis not present

## 2024-03-06 LAB — POC URINALSYSI DIPSTICK (AUTOMATED)
Bilirubin, UA: NEGATIVE
Blood, UA: NEGATIVE
Glucose, UA: NEGATIVE
Ketones, UA: NEGATIVE
Nitrite, UA: POSITIVE
Protein, UA: NEGATIVE
Spec Grav, UA: 1.01 (ref 1.010–1.025)
Urobilinogen, UA: 0.2 U/dL — AB
pH, UA: 6 (ref 5.0–8.0)

## 2024-03-06 MED ORDER — CIPROFLOXACIN HCL 250 MG PO TABS: 250.0000 mg | ORAL_TABLET | Freq: Two times a day (BID) | ORAL | 2 refills | Status: DC

## 2024-03-06 NOTE — Assessment & Plan Note (Signed)
 Check UA, Cx Recurrent UTIs Cipro  250 mg bid x 7 d

## 2024-03-06 NOTE — Assessment & Plan Note (Addendum)
 Worse Planning to move to an assisted living: Takari Duncombe has a Child psychotherapist assigned to her to help

## 2024-03-06 NOTE — Progress Notes (Signed)
 Subjective:  Patient ID: [name], female    DOB: [birth year]  Age: 73 y.o. MRN: [id number]  CC: Urinary Tract Infection (UTI like symptoms for several days. Notes some increased frequency of urination, burning, abdominal pain, head ache. Currently treating with Azo and d-mannosse. Will need )   HPI [name] presents for another UTI, possibly. F/u anxiety Planning to move to an assisted living: Salinda Snedeker has a Child psychotherapist assigned to her  Outpatient Medications Prior to Visit  Medication Sig Dispense Refill   acetaminophen  (TYLENOL ) 500 MG tablet Take 1,000 mg by mouth at bedtime.     acetaminophen  (TYLENOL ) 500 MG tablet Take 500 mg by mouth every 4 (four) hours as needed (Patient reported 2 in the morning 2 at night.).     ALPRAZolam  (XANAX ) 1 MG tablet Take 1 tablet (1 mg total) by mouth 3 (three) times daily. (Patient taking differently: Take 1 mg by mouth 3 (three) times daily. Home is only giving her twice daily instead of TID) 90 tablet 5   ascorbic acid  (VITAMIN C ) 500 MG tablet Take 1 tablet (500 mg total) by mouth daily. 30 tablet 11   aspirin  EC (ASPIRIN  LOW DOSE) 81 MG tablet Take 1 tablet (81 mg total) by mouth daily. Swallow whole. 30 tablet 3   busPIRone  (BUSPAR ) 30 MG tablet Take 1 tablet (30 mg total) by mouth 2 (two) times daily. 60 tablet 3   carvedilol  (COREG ) 12.5 MG tablet Take 1 tablet (12.5 mg total) by mouth every evening. (Patient taking differently: Take 12.5 mg by mouth 2 (two) times daily with a meal.) 90 tablet 3   Cholecalciferol  (D3 5000) 125 MCG (5000 UT) capsule Take 1 capsule (5,000 Units total) by mouth daily. 30 capsule 11   cloNIDine  (CATAPRES ) 0.2 MG tablet Take 1 tablet (0.2 mg total) by mouth at bedtime. 90 tablet 3   colesevelam  (WELCHOL ) 625 MG tablet Take 2 tablets (1,250 mg total) by mouth 2 (two) times daily with a meal. 360 tablet 3   cyanocobalamin  (VITAMIN B12) 1000 MCG tablet Take 1 tablet (1,000 mcg total) by mouth  daily. Take 1,000 mcg by mouth daily. 30 tablet 11   D-Mannose 500 MG CAPS Take 1 capsule (500 mg total) by mouth 2 (two) times daily. WITH CRANBERRY & DANDELION EXTRACT 60 capsule 11   esomeprazole  (NEXIUM ) 40 MG capsule Take 1 capsule (40 mg total) by mouth every morning. 90 capsule 3   FLUoxetine  (PROZAC ) 40 MG capsule Take 1 capsule (40 mg total) by mouth daily. 30 capsule 11   hydrALAZINE  (APRESOLINE ) 10 MG tablet Take 1 tablet by mouth 3 times daily. MAY TAKE EXTRA 10MG  FOR SBP>160 120 tablet 9   ipratropium-albuterol  (DUONEB) 0.5-2.5 (3) MG/3ML SOLN Take 3 mLs by nebulization every 6 (six) hours as needed (Asthma). 360 mL 3   iron  polysaccharides (POLY-IRON  150) 150 MG capsule Take 1 capsule (150 mg total) by mouth daily. 30 capsule 11   Lactobacillus (ACIDOPHILUS) CAPS capsule Take 1 capsule by mouth daily. 30 capsule 11   levothyroxine  (SYNTHROID ) 100 MCG tablet Take 1 tablet (100 mcg total) by mouth daily before breakfast. 30 tablet 11   loperamide  (IMODIUM  A-D) 2 MG tablet Take 1 tablet (2 mg total) by mouth 4 (four) times daily as needed for diarrhea or loose stools. 100 tablet 11   Metamucil Fiber 2 g CHEW Chew 2 tablets by mouth in the morning and at bedtime. 120 tablet 11   mirtazapine  (REMERON )  30 MG tablet Take 1 tablet (30 mg total) by mouth at bedtime. 30 tablet 5   nystatin  (MYCOSTATIN /NYSTOP ) powder Use every day on rash 60 g 5   omega-3 acid ethyl esters (LOVAZA ) 1 g capsule Take 2 capsules (2 g total) by mouth 2 (two) times daily. 120 capsule 11   OVER THE COUNTER MEDICATION Take 1 capsule by mouth daily. 11 MUSHROOM BLEND     Polyethyl Glycol-Propyl Glycol (SYSTANE) 0.4-0.3 % SOLN Place 1 drop into both eyes 3 (three) times daily as needed (dry eyes). Place 1 drop into both eyes 3 (three) times daily as needed (dry eyes). 15 mL 2   rosuvastatin  (CRESTOR ) 10 MG tablet Take 1 tablet (10 mg total) by mouth daily. 90 tablet 3   sodium chloride  (OCEAN) 0.65 % SOLN nasal spray  Place 1 spray into both nostrils daily. 30 mL 1   traMADol  (ULTRAM ) 50 MG tablet Take 1 tablet (50 mg total) by mouth every 6 (six) hours as needed for severe pain (pain score 7-10). 120 tablet 2   ciprofloxacin  (CIPRO ) 250 MG tablet Take 1 tablet (250 mg total) by mouth 2 (two) times daily. 14 tablet 2   No facility-administered medications prior to visit.    ROS: Review of Systems  Constitutional:  Positive for fatigue. Negative for activity change, appetite change, chills and unexpected weight change.  HENT:  Negative for congestion, mouth sores and sinus pressure.   Eyes:  Negative for visual disturbance.  Respiratory:  Negative for cough and chest tightness.   Gastrointestinal:  Negative for abdominal pain and nausea.  Genitourinary:  Positive for dysuria, frequency and urgency. Negative for difficulty urinating and vaginal pain.  Musculoskeletal:  Positive for gait problem. Negative for back pain.  Skin:  Negative for pallor and rash.  Neurological:  Positive for dizziness, weakness and light-headedness. Negative for tremors, numbness and headaches.  Psychiatric/Behavioral:  Negative for confusion, sleep disturbance and suicidal ideas.     Objective:  BP 116/74   Pulse 75   Temp (!) 97.5 F (36.4 C)   Ht 5' 2 (1.575 m)   Wt 172 lb 6.4 oz (78.2 kg)   SpO2 98%   BMI 31.53 kg/m   BP Readings from Last 3 Encounters:  03/06/24 116/74  02/16/24 129/85  02/11/24 126/83    Wt Readings from Last 3 Encounters:  03/06/24 172 lb 6.4 oz (78.2 kg)  02/16/24 172 lb 3.2 oz (78.1 kg)  02/11/24 168 lb (76.2 kg)    Physical Exam Constitutional:      General: She is not in acute distress.    Appearance: She is well-developed. She is obese. She is not ill-appearing or toxic-appearing.  HENT:     Head: Normocephalic.     Right Ear: External ear normal.     Left Ear: External ear normal.     Nose: Nose normal.  Eyes:     General:        Right eye: No discharge.        Left  eye: No discharge.     Conjunctiva/sclera: Conjunctivae normal.     Pupils: Pupils are equal, round, and reactive to light.  Neck:     Thyroid : No thyromegaly.     Vascular: No JVD.     Trachea: No tracheal deviation.  Cardiovascular:     Rate and Rhythm: Normal rate and regular rhythm.     Heart sounds: Normal heart sounds.  Pulmonary:     Effort: No respiratory  distress.     Breath sounds: No stridor. No wheezing.  Abdominal:     General: Bowel sounds are normal. There is no distension.     Palpations: Abdomen is soft. There is no mass.     Tenderness: There is no abdominal tenderness. There is no guarding or rebound.  Musculoskeletal:        General: Tenderness present.     Cervical back: Normal range of motion and neck supple. No rigidity.     Right lower leg: No edema.     Left lower leg: No edema.  Lymphadenopathy:     Cervical: No cervical adenopathy.  Skin:    Findings: No erythema or rash.  Neurological:     Mental Status: She is oriented to person, place, and time.     Cranial Nerves: No cranial nerve deficit.     Motor: No abnormal muscle tone.     Coordination: Coordination abnormal.     Gait: Gait abnormal.     Deep Tendon Reflexes: Reflexes normal.  Psychiatric:        Behavior: Behavior normal.        Thought Content: Thought content normal.        Judgment: Judgment normal.     Lab Results  Component Value Date   WBC 6.5 02/11/2024   HGB 11.9 (L) 02/11/2024   HCT 36.1 02/11/2024   PLT 272 02/11/2024   GLUCOSE 111 (H) 02/11/2024   CHOL 277 (H) 09/23/2022   TRIG 276 (H) 09/23/2022   HDL 51 09/23/2022   LDLDIRECT 64 11/14/2023   LDLCALC 174 (H) 09/23/2022   ALT 15 02/09/2024   AST 22 02/09/2024   NA 140 02/11/2024   K 3.6 02/11/2024   CL 107 02/11/2024   CREATININE 1.11 (H) 02/11/2024   BUN 17 02/11/2024   CO2 20 (L) 02/11/2024   TSH 3.90 01/03/2024   INR 1.0 07/15/2020   HGBA1C 6.0 01/03/2024    CT HEAD WO CONTRAST ( ) Result Date:  02/11/2024 CLINICAL DATA:  Initial evaluation for acute headache. EXAM: CT HEAD WITHOUT CONTRAST TECHNIQUE: Contiguous axial images were obtained from the base of the skull through the vertex without intravenous contrast. RADIATION DOSE REDUCTION: This exam was performed according to the departmental dose-optimization program which includes automated exposure control, adjustment of the mA and/or kV according to patient size and/or use of iterative reconstruction technique. COMPARISON:  CT from 01/06/2024 FINDINGS: Brain: Cerebral volume within normal limits. Changes of mild chronic microvascular ischemic disease. No acute intracranial hemorrhage. No acute large vessel territory infarct. No mass lesion, midline shift or mass effect. No hydrocephalus or extra-axial fluid collection. Vascular: No abnormal hyperdense vessel. Scattered vascular calcifications noted within the carotid siphons. Skull: Scalp soft tissues demonstrate no acute finding. Calvarium intact. Sinuses/Orbits: Globes orbital soft tissues within normal limits. Paranasal sinuses are largely clear. No mastoid effusion. Other: None. IMPRESSION: 1. No acute intracranial abnormality. 2. Mild chronic microvascular ischemic disease. Electronically Signed   By: Morene Hoard M.D.   On: 02/11/2024 03:17   DG Chest Port 1 View Result Date: 02/11/2024 EXAM: 1 VIEW(S) XRAY OF THE CHEST 02/11/2024 02:34:19 AM COMPARISON: 04/21/2023 CLINICAL HISTORY: cp FINDINGS: LUNGS AND PLEURA: Right upper lung scarring and surgical changes. No pulmonary edema. No pleural effusion. No pneumothorax. HEART AND MEDIASTINUM: No acute abnormality of the cardiac and mediastinal silhouettes. BONES AND SOFT TISSUES: Cervical spine surgical hardware noted. IMPRESSION: 1. No acute process. Electronically signed by: Norman Gatlin MD 02/11/2024 02:52 AM EDT  RP Workstation: [id number]    Assessment & Plan:   Problem List Items Addressed This Visit     Anxiety    Worse Planning to move to an assisted living: Jerriyah Louis has a Child psychotherapist assigned to her to help      CKD stage 3b, GFR 30-44 ml/min (HCC)   Hydrate well      Dysuria - Primary   Check UA, Cx Recurrent UTIs Cipro  250 mg bid x 7 d      Relevant Orders   Urinalysis   CULTURE, URINE COMPREHENSIVE   POCT Urinalysis Dipstick (Automated) (Completed)   Urine Culture      Meds ordered this encounter  Medications   DISCONTD: ciprofloxacin  (CIPRO ) 250 MG tablet    Sig: Take 1 tablet (250 mg total) by mouth 2 (two) times daily.    Dispense:  14 tablet    Refill:  2   DISCONTD: ciprofloxacin  (CIPRO ) 250 MG tablet    Sig: Take 1 tablet (250 mg total) by mouth 2 (two) times daily.    Dispense:  14 tablet    Refill:  2   ciprofloxacin  (CIPRO ) 250 MG tablet    Sig: Take 1 tablet (250 mg total) by mouth 2 (two) times daily.    Dispense:  14 tablet    Refill:  2      Follow-up: Return in about 3 months (around 06/06/2024) for f/u with PCP.  Marolyn Noel, MD

## 2024-03-06 NOTE — Assessment & Plan Note (Signed)
 Hydrate well

## 2024-03-09 ENCOUNTER — Telehealth: Payer: Self-pay | Admitting: Internal Medicine

## 2024-03-09 MED ORDER — DIPHENOXYLATE-ATROPINE 2.5-0.025 MG PO TABS: 1.0000 | ORAL_TABLET | Freq: Four times a day (QID) | ORAL | 0 refills | Status: AC | PRN

## 2024-03-09 MED ORDER — NITROFURANTOIN MONOHYD MACRO 100 MG PO CAPS: 100.0000 mg | ORAL_CAPSULE | Freq: Two times a day (BID) | ORAL | 0 refills | Status: AC

## 2024-03-09 NOTE — Telephone Encounter (Signed)
 Diarrhea, cramps from Cipro . Requested Rx, another abx. Emailed Macrobid , Lomotil  Stop Cipro  Stool for C diff on Mon if not better To ER if sick

## 2024-03-13 ENCOUNTER — Ambulatory Visit (INDEPENDENT_AMBULATORY_CARE_PROVIDER_SITE_OTHER): Admitting: Psychology

## 2024-03-13 DIAGNOSIS — F4321 Adjustment disorder with depressed mood: Secondary | ICD-10-CM

## 2024-03-13 DIAGNOSIS — F4323 Adjustment disorder with mixed anxiety and depressed mood: Secondary | ICD-10-CM

## 2024-03-13 NOTE — Progress Notes (Signed)
 [name] is a 73 y.o. 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  Symptoms A diagnosis of an acute, serious illness that is life-threatening. (Status: maintained) -- No Description Entered  Diminished interest in or enjoyment of activities. (Status: maintained) -- No Description Entered  Lack of energy. (Status: maintained) -- No Description Entered  Sad affect, social withdrawal, anxiety, loss of interest in activities, and low energy. (Status: maintained) -- No Description Entered  Medication Status compliance  Safety unspecified If Suicidal or Homicidal State Action Taken: unspecified  Current Risk:  Medications Buspar  (Dosage: 30mg  2X/day)  New Medication (Dosage: 250mg  2x/day)  New Medication (Dosage: 250mg  2x/day)  Prozac  (Dosage: 40mg  daily)  Remeron  (Dosage: 30mg  nightly)  xanax  (Dosage: .5mg  2-3X/day)  Objectives Related Problem: Recognize, accept, and cope with feelings of depression. Description: Identify and replace thoughts and beliefs that support depression. Target Date: 2024-04-28 Frequency: Daily Modality: individual Progress: 80%  Related Problem: Recognize, accept, and cope with feelings of depression. Description: Verbalize an understanding of healthy and unhealthy emotions with the intent of increasing the use of healthy emotions to guide actions. Target Date: 2024-04-28 Frequency: Daily Modality: individual Progress: 85%  Related Problem: Recognize, accept, and cope with feelings of depression. Description: Increasingly verbalize hopeful and positive statements regarding self, others, and the future. Target Date: 2022-04-28 Frequency: Daily Modality: individual Progress: 100%  Related  Problem: Reduce fear, anxiety, and worry associated with the medical condition. Description: Share with significant others efforts to adapt successfully to the medical condition/diagnosis. Target Date: 2024-04-28 Frequency: Daily Modality: individual Progress: 85%  Related Problem: Reduce fear, anxiety, and worry associated with the medical condition. Description: Engage in social, productive, and recreational activities that are possible in spite of medical condition. Target Date: 2024-04-28 Frequency: Daily Modality: individual Progress: 90%  Related Problem: Reduce fear, anxiety, and worry associated with the medical condition. Description: Identify the coping skills and sources of emotional support that have been beneficial in the past. Target Date: 2024-04-28 Frequency: Daily Modality: individual Progress: 90%  Client Response full compliance  Service Location Location, [address] [place], [place] [address]  Service Code cpt [phone]  Related past to present  Facilitate problem solving  Identify/label emotions  Validate/empathize  Self care activities  Lifestyle change (exercise, nutrition)  Assess/facilitate readiness to change  Relaxation training  Rationally challenge thoughts or beliefs/cognitive restructuring  Comments  Dx: F43.21  Meds: Prozac , Buspar  and Remeron .  Goals: Following the failure of a third marriage, patient is struggling to rebuild her life and adjust to her new circumstances. She suffers a negative self-concept and has little confidence in her judgement. She is seeking to reduce symptoms of anxiety and self-doubt and to re-define satisfa ction moving forward. Therapy will focus on cognitive restructuring to redefine her experiences and focus on those satisfying aspects of her life. Will target this goal completion for April1, 2021 (completed). She is also wanting to utilize counseling to adjust  and accept her latest diagnosis of cancer. She wants  assistance to better manage this news and remain positive throughout treatment. Goal date is 12-22. Patient has made progress, but her medical condition is variable and she requests additional treatment to manage moods throughout her medical ordeal. In addition, she is trying to manage feelings associated with her older son's mental health struggles. Revised goal date is 12-25.   Patient agrees to a video session and is aware of the limitations of this platform. She is at home and I am in home office.   [name]: She states she is still adjusting to the new facility. It has been a difficult adjustment. She feels life is short and she is surprised to still be alive. Says her memory is declining, but she is doing exercising to help improve her abilities. States she has been moderately depressed and it has been a struggle. Evident that she is repeating herself and is unaware. She is however, able to track reasonably well. The facility did do some cognitive testing and she reports that her results were good. We talked about how she is trying to adjust to the concept that she is at her final home.                                                                                                                                                                                                                                                                                                                                       Jama Mcmiller LEWIS Time: 2:15p-3:00p 45 minutes

## 2024-03-14 ENCOUNTER — Ambulatory Visit: Admitting: Psychology

## 2024-03-14 ENCOUNTER — Telehealth: Payer: Self-pay

## 2024-03-14 NOTE — Patient Outreach (Signed)
 Complex Care Management   Visit Note  03/14/2024  Name:  AMBRI MILTNER MRN: [id number] DOB: Feb 18, 1951  Situation: RNCM received message from care guide unsuccessful outreach attempts to reach patient. RNCM attempted an additional outreach call today. Per 02/16/24 Cardiology note patient is at [facility]   Follow Up Plan:   Closing From:  Complex Care Management  Heddy Shutter, RN, MSN, BSN, CCM [place]  [facility], Population Health Case Manager Phone: [phone]

## 2024-03-15 ENCOUNTER — Telehealth: Payer: Self-pay | Admitting: Pharmacist

## 2024-03-15 NOTE — Telephone Encounter (Signed)
 Pharmacy Quality Measure Review  This patient is appearing on a report for being at risk of failing the adherence measure for cholesterol (statin) medications this calendar year.   Medication: Rosuvastatin  Last fill date: 01/14/24 for 30 day supply  MyChart message sent to patient. Pt notes she is still taking rosuvastatin  however she is now residing at [facility] and they administer her medications to her therefore, may no longer be getting through pharmacy. No further action needed.  Darrelyn Drum, PharmD, BCPS, CPP Clinical Pharmacist Practitioner [facility] [phone]

## 2024-04-10 ENCOUNTER — Ambulatory Visit (INDEPENDENT_AMBULATORY_CARE_PROVIDER_SITE_OTHER): Admitting: Psychology

## 2024-04-10 DIAGNOSIS — F4321 Adjustment disorder with depressed mood: Secondary | ICD-10-CM | POA: Diagnosis not present

## 2024-04-10 DIAGNOSIS — F4323 Adjustment disorder with mixed anxiety and depressed mood: Secondary | ICD-10-CM

## 2024-04-10 NOTE — Progress Notes (Signed)
 [name] is a 73 y.o. 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  Symptoms A diagnosis of an acute, serious illness that is life-threatening. (Status: maintained) -- No Description Entered  Diminished interest in or enjoyment of activities. (Status: maintained) -- No Description Entered  Lack of energy. (Status: maintained) -- No Description Entered  Sad affect, social withdrawal, anxiety, loss of interest in activities, and low energy. (Status: maintained) -- No Description Entered  Medication Status compliance  Safety unspecified If Suicidal or Homicidal State Action Taken: unspecified  Current Risk:  Medications Buspar  (Dosage: 30mg  2X/day)  New Medication (Dosage: 250mg  2x/day)  New Medication (Dosage: 250mg  2x/day)  Prozac  (Dosage: 40mg  daily)  Remeron  (Dosage: 30mg  nightly)  xanax  (Dosage: .5mg  2-3X/day)  Objectives Related Problem: Recognize, accept, and cope with feelings of depression. Description: Identify and replace thoughts and beliefs that support depression. Target Date: 2024-04-28 Frequency: Daily Modality: individual Progress: 80%  Related Problem: Recognize, accept, and cope with feelings of depression. Description: Verbalize an understanding of healthy and unhealthy emotions with the intent of increasing the use of healthy emotions to guide actions. Target Date: 2024-04-28 Frequency: Daily Modality: individual Progress: 85%  Related Problem: Recognize, accept, and cope with feelings of depression. Description: Increasingly verbalize hopeful and positive statements regarding self, others, and the future. Target Date: 2022-04-28 Frequency: Daily Modality:  individual Progress: 100%  Related Problem: Reduce fear, anxiety, and worry associated with the medical condition. Description: Share with significant others efforts to adapt successfully to the medical condition/diagnosis. Target Date: 2024-04-28 Frequency: Daily Modality: individual Progress: 85%  Related Problem: Reduce fear, anxiety, and worry associated with the medical condition. Description: Engage in social, productive, and recreational activities that are possible in spite of medical condition. Target Date: 2024-04-28 Frequency: Daily Modality: individual Progress: 90%  Related Problem: Reduce fear, anxiety, and worry associated with the medical condition. Description: Identify the coping skills and sources of emotional support that have been beneficial in the past. Target Date: 2024-04-28 Frequency: Daily Modality: individual Progress: 90%  Client Response full compliance  Service Location Location, [address] [place], [place] [address]  Service Code cpt [id number]  Related past to present  Facilitate problem solving  Identify/label emotions  Validate/empathize  Self care activities  Lifestyle change (exercise, nutrition)  Assess/facilitate readiness to change  Relaxation training  Rationally challenge thoughts or beliefs/cognitive restructuring  Comments  Dx: F43.21  Meds: Prozac , Buspar  and Remeron .  Goals: Following the failure of a third marriage, patient is struggling to rebuild her life and adjust to her new circumstances. She suffers a negative self-concept and has little confidence in her judgement. She is seeking to reduce symptoms of anxiety and self-doubt and to re-define satisfa ction moving forward. Therapy will focus on cognitive restructuring to redefine her experiences and focus on those satisfying aspects of her life. Will target this  goal completion for April1, 2021 (completed). She is also wanting to utilize counseling to adjust and accept her  latest diagnosis of cancer. She wants assistance to better manage this news and remain positive throughout treatment. Goal date is 12-22. Patient has made progress, but her medical condition is variable and she requests additional treatment to manage moods throughout her medical ordeal. In addition, she is trying to manage feelings associated with her older son's mental health struggles. Revised goal date is 12-25.   Patient agrees to a video session and is aware of the limitations of this platform. She is at home and I am in home office.   [name]: States she is more settled in her residential facility. Her new roommate is confined to her bed. She says many of the residents eat alone in their rooms. She has been using meditation and breathing when anxious. She has anxiety about her loss of memory and has noticed that she struggles to remember names and faces. She is working to accept her situation and is getting better at managing feelings. She talked about feeling gratified that her son is dating a very nice woman. This gives her great comfort, particularly given the situation with her other son. She does say that [name] is trying to help her find another facility where she can have a single room. She has not found any people at this facility that she can relate to with her level of cognitive ability. In spite of everything, her moods are relatively stable.                                                                                                                                                                                                                                                                                                                                         [name] Time: 2:15p-3:00p 45 minutes

## 2024-04-11 ENCOUNTER — Ambulatory Visit: Admitting: Psychology

## 2024-04-16 ENCOUNTER — Ambulatory Visit: Admitting: Psychology

## 2024-04-16 DIAGNOSIS — F4323 Adjustment disorder with mixed anxiety and depressed mood: Secondary | ICD-10-CM | POA: Diagnosis not present

## 2024-04-16 NOTE — Progress Notes (Signed)
 [name] is a 73 y.o. 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  Symptoms A diagnosis of an acute, serious illness that is life-threatening. (Status: maintained) -- No Description Entered  Diminished interest in or enjoyment of activities. (Status: maintained) -- No Description Entered  Lack of energy. (Status: maintained) -- No Description Entered  Sad affect, social withdrawal, anxiety, loss of interest in activities, and low energy. (Status: maintained) -- No Description Entered  Medication Status compliance  Safety unspecified If Suicidal or Homicidal State Action Taken: unspecified  Current Risk:  Medications Buspar  (Dosage: 30mg  2X/day)  New Medication (Dosage: 250mg  2x/day)  New Medication (Dosage: 250mg  2x/day)  Prozac  (Dosage: 40mg  daily)  Remeron  (Dosage: 30mg  nightly)  xanax  (Dosage: .5mg  2-3X/day)  Objectives Related Problem: Recognize, accept, and cope with feelings of depression. Description: Identify and replace thoughts and beliefs that support depression. Target Date: 2025-04-28 Frequency: Daily Modality: individual Progress: 90%  Related Problem: Recognize, accept, and cope with feelings of depression. Description: Verbalize an understanding of healthy and unhealthy emotions with the intent of increasing the use of healthy emotions to guide actions. Target Date: 2025-04-28 Frequency: Daily Modality: individual Progress: 85%  Related Problem: Recognize, accept, and cope with feelings of depression. Description: Increasingly verbalize hopeful and positive statements regarding self, others, and the future. Target Date: 2022-04-28 Frequency:  Daily Modality: individual Progress: 100%  Related Problem: Reduce fear, anxiety, and worry associated with the medical condition. Description: Share with significant others efforts to adapt successfully to the medical condition/diagnosis. Target Date: 2025-04-28 Frequency: Daily Modality: individual Progress: 85%  Related Problem: Reduce fear, anxiety, and worry associated with the medical condition. Description: Engage in social, productive, and recreational activities that are possible in spite of medical condition. Target Date: 2025-04-28 Frequency: Daily Modality: individual Progress: 90%  Related Problem: Reduce fear, anxiety, and worry associated with the medical condition. Description: Identify the coping skills and sources of emotional support that have been beneficial in the past. Target Date: 2025-04-28 Frequency: Daily Modality: individual Progress: 95%  Client Response full compliance  Service Location Location, [address] [place], [place] [address]  Service Code cpt [phone]  Related past to present  Facilitate problem solving  Identify/label emotions  Validate/empathize  Self care activities  Lifestyle change (exercise, nutrition)  Assess/facilitate readiness to change  Relaxation training  Rationally challenge thoughts or beliefs/cognitive restructuring  Comments  Dx: F43.21  Meds: Prozac , Buspar  and Remeron .  Goals: Following the failure of a third marriage, patient is struggling to rebuild her life and adjust to her new circumstances. She suffers a negative self-concept and has little confidence in her judgement. She is seeking to reduce symptoms of anxiety and self-doubt and to re-define satisfa ction moving forward. Therapy will focus on cognitive restructuring to  redefine her experiences and focus on those satisfying aspects of her life. Will target this goal completion for April1, 2021 (completed). She is also wanting to utilize counseling to adjust  and accept her latest diagnosis of cancer. She wants assistance to better manage this news and remain positive throughout treatment. Goal date is 12-22. Patient has made progress, but her medical condition is variable and she requests additional treatment to manage moods throughout her medical ordeal. In addition, she is trying to manage feelings associated with her older son's mental health struggles. Revised goal date is 12-26.   Patient agrees to a video session and is aware of the limitations of this platform. She is at home and I am in home office.   [name]: She talked about feeling a little depressed about not having any visitors on Thanksgiving. Says her son got angry with her when she called. He told her that she was being overly demanding and said Mom, you know we have never had a close relationship. She says she realizes he is closer to his dad, but is feeling hurt. She claims she is happy that he is so happy, but is feeling left out. She feels she has made many mistakes and struggles between regret and gratitude. We talked about how to balance her emotions and resist depressive tendencies.                                                                                                                                                                                                                                                                                                                                            [name] Time: 4:15p-5:00p 45 minutes

## 2024-04-24 ENCOUNTER — Ambulatory Visit: Admitting: Psychology

## 2024-04-24 DIAGNOSIS — F4323 Adjustment disorder with mixed anxiety and depressed mood: Secondary | ICD-10-CM

## 2024-04-24 NOTE — Progress Notes (Signed)
 [name] is a 73 y.o. 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  Symptoms A diagnosis of an acute, serious illness that is life-threatening. (Status: maintained) -- No Description Entered  Diminished interest in or enjoyment of activities. (Status: maintained) -- No Description Entered  Lack of energy. (Status: maintained) -- No Description Entered  Sad affect, social withdrawal, anxiety, loss of interest in activities, and low energy. (Status: maintained) -- No Description Entered  Medication Status compliance  Safety unspecified If Suicidal or Homicidal State Action Taken: unspecified  Current Risk:  Medications Buspar  (Dosage: 30mg  2X/day)  New Medication (Dosage: 250mg  2x/day)  New Medication (Dosage: 250mg  2x/day)  Prozac  (Dosage: 40mg  daily)  Remeron  (Dosage: 30mg  nightly)  xanax  (Dosage: .5mg  2-3X/day)  Objectives Related Problem: Recognize, accept, and cope with feelings of depression. Description: Identify and replace thoughts and beliefs that support depression. Target Date: 2025-04-28 Frequency: Daily Modality: individual Progress: 90%  Related Problem: Recognize, accept, and cope with feelings of depression. Description: Verbalize an understanding of healthy and unhealthy emotions with the intent of increasing the use of healthy emotions to guide actions. Target Date: 2025-04-28 Frequency: Daily Modality: individual Progress: 85%  Related Problem: Recognize, accept, and cope with feelings of depression. Description: Increasingly verbalize hopeful and positive statements regarding self, others, and the future. Target  Date: 2022-04-28 Frequency: Daily Modality: individual Progress: 100%  Related Problem: Reduce fear, anxiety, and worry associated with the medical condition. Description: Share with significant others efforts to adapt successfully to the medical condition/diagnosis. Target Date: 2025-04-28 Frequency: Daily Modality: individual Progress: 85%  Related Problem: Reduce fear, anxiety, and worry associated with the medical condition. Description: Engage in social, productive, and recreational activities that are possible in spite of medical condition. Target Date: 2025-04-28 Frequency: Daily Modality: individual Progress: 90%  Related Problem: Reduce fear, anxiety, and worry associated with the medical condition. Description: Identify the coping skills and sources of emotional support that have been beneficial in the past. Target Date: 2025-04-28 Frequency: Daily Modality: individual Progress: 95%  Client Response full compliance  Service Location Location, [address] [place], [place] [address]  Service Code cpt [phone]  Related past to present  Facilitate problem solving  Identify/label emotions  Validate/empathize  Self care activities  Lifestyle change (exercise, nutrition)  Assess/facilitate readiness to change  Relaxation training  Rationally challenge thoughts or beliefs/cognitive restructuring  Comments  Dx: F43.21  Meds: Prozac , Buspar  and Remeron .  Goals: Following the failure of a third marriage, patient is struggling to rebuild her life and adjust to her new circumstances. She suffers a negative self-concept and has little confidence in her judgement. She is seeking to reduce symptoms of anxiety and  self-doubt and to re-define satisfa ction moving forward. Therapy will focus on cognitive restructuring to redefine her experiences and focus on those satisfying aspects of her life. Will target this goal completion for April1, 2021 (completed). She is also wanting to  utilize counseling to adjust and accept her latest diagnosis of cancer. She wants assistance to better manage this news and remain positive throughout treatment. Goal date is 12-22. Patient has made progress, but her medical condition is variable and she requests additional treatment to manage moods throughout her medical ordeal. In addition, she is trying to manage feelings associated with her older son's mental health struggles. Revised goal date is 12-26.   Patient agrees to a video session and is aware of the limitations of this platform. She is at home and I am in home office.   [name]: She states that her facility is bringing by a psychiatrist for her to meet. Not sure why, but likely just to review her meds. She will stop seeing Dr. Tasia and see the provider associated with the facility. [name] says she is getting more depressed and thinks it is because her roommate is dying. The roommate is refusing to eat and is confined to her bed. [name] called her for the first time in a while today. She is feeling better about that situation. Feels depression is situational and will likely resolve soon.                                                                                                                                                                                                                                                                                                                                              [name] Time: 2:15p-3:00p 45 minutes

## 2024-04-25 ENCOUNTER — Ambulatory Visit: Admitting: Psychology

## 2024-04-27 ENCOUNTER — Encounter (HOSPITAL_COMMUNITY): Payer: Self-pay | Admitting: Emergency Medicine

## 2024-04-27 ENCOUNTER — Ambulatory Visit (INDEPENDENT_AMBULATORY_CARE_PROVIDER_SITE_OTHER)
Admission: EM | Admit: 2024-04-27 | Discharge: 2024-04-27 | Disposition: A | Discharge status: Other Acute Inpatient Hospital | Source: Home / Self Care

## 2024-04-27 ENCOUNTER — Emergency Department (HOSPITAL_COMMUNITY)

## 2024-04-27 ENCOUNTER — Emergency Department (HOSPITAL_COMMUNITY)
Admission: EM | Admit: 2024-04-27 | Discharge: 2024-04-28 | Disposition: A | Attending: Emergency Medicine | Admitting: Emergency Medicine

## 2024-04-27 ENCOUNTER — Other Ambulatory Visit: Payer: Self-pay

## 2024-04-27 DIAGNOSIS — R350 Frequency of micturition: Secondary | ICD-10-CM | POA: Diagnosis present

## 2024-04-27 DIAGNOSIS — Z7982 Long term (current) use of aspirin: Secondary | ICD-10-CM | POA: Insufficient documentation

## 2024-04-27 DIAGNOSIS — F39 Unspecified mood [affective] disorder: Secondary | ICD-10-CM | POA: Insufficient documentation

## 2024-04-27 DIAGNOSIS — E039 Hypothyroidism, unspecified: Secondary | ICD-10-CM | POA: Diagnosis not present

## 2024-04-27 DIAGNOSIS — N3000 Acute cystitis without hematuria: Secondary | ICD-10-CM | POA: Diagnosis not present

## 2024-04-27 DIAGNOSIS — R45851 Suicidal ideations: Secondary | ICD-10-CM | POA: Diagnosis not present

## 2024-04-27 DIAGNOSIS — F411 Generalized anxiety disorder: Secondary | ICD-10-CM

## 2024-04-27 DIAGNOSIS — R519 Headache, unspecified: Secondary | ICD-10-CM | POA: Insufficient documentation

## 2024-04-27 DIAGNOSIS — F332 Major depressive disorder, recurrent severe without psychotic features: Secondary | ICD-10-CM | POA: Diagnosis present

## 2024-04-27 DIAGNOSIS — Z79899 Other long term (current) drug therapy: Secondary | ICD-10-CM | POA: Insufficient documentation

## 2024-04-27 DIAGNOSIS — J45909 Unspecified asthma, uncomplicated: Secondary | ICD-10-CM | POA: Diagnosis not present

## 2024-04-27 LAB — COMPREHENSIVE METABOLIC PANEL WITH GFR
ALT: 13 U/L (ref 0–44)
AST: 25 U/L (ref 15–41)
Albumin: 4.6 g/dL (ref 3.5–5.0)
Alkaline Phosphatase: 117 U/L (ref 38–126)
Anion gap: 14 (ref 5–15)
BUN: 14 mg/dL (ref 8–23)
CO2: 22 mmol/L (ref 22–32)
Calcium: 9.3 mg/dL (ref 8.9–10.3)
Chloride: 102 mmol/L (ref 98–111)
Creatinine, Ser: 1.07 mg/dL — ABNORMAL HIGH (ref 0.44–1.00)
GFR, Estimated: 54 mL/min — ABNORMAL LOW (ref >60)
Glucose, Bld: 113 mg/dL — ABNORMAL HIGH (ref 70–99)
Potassium: 3.5 mmol/L (ref 3.5–5.1)
Sodium: 138 mmol/L (ref 135–145)
Total Bilirubin: 0.3 mg/dL (ref 0.0–1.2)
Total Protein: 7.6 g/dL (ref 6.5–8.1)

## 2024-04-27 LAB — URINALYSIS, ROUTINE W REFLEX MICROSCOPIC
Bilirubin Urine: NEGATIVE
Glucose, UA: NEGATIVE mg/dL
Hgb urine dipstick: NEGATIVE
Ketones, ur: NEGATIVE mg/dL
Nitrite: NEGATIVE
Protein, ur: NEGATIVE mg/dL
Specific Gravity, Urine: 1.004 — ABNORMAL LOW (ref 1.005–1.030)
pH: 6 (ref 5.0–8.0)

## 2024-04-27 LAB — CBC
HCT: 38.4 % (ref 36.0–46.0)
Hemoglobin: 13 g/dL (ref 12.0–15.0)
MCH: 30.9 pg (ref 26.0–34.0)
MCHC: 33.9 g/dL (ref 30.0–36.0)
MCV: 91.2 fL (ref 80.0–100.0)
Platelets: 318 K/uL (ref 150–400)
RBC: 4.21 MIL/uL (ref 3.87–5.11)
RDW: 13.4 % (ref 11.5–15.5)
WBC: 7 K/uL (ref 4.0–10.5)
nRBC: 0 % (ref 0.0–0.2)

## 2024-04-27 LAB — POCT URINE DRUG SCREEN - MANUAL ENTRY (I-SCREEN)
POC Amphetamine UR: NOT DETECTED
POC Buprenorphine (BUP): NOT DETECTED
POC Cocaine UR: NOT DETECTED
POC Marijuana UR: NOT DETECTED
POC Methadone UR: NOT DETECTED
POC Methamphetamine UR: NOT DETECTED
POC Morphine: NOT DETECTED
POC Oxazepam (BZO): POSITIVE — AB
POC Oxycodone UR: NOT DETECTED
POC Secobarbital (BAR): NOT DETECTED

## 2024-04-27 MED ORDER — VITAMIN B-12 1000 MCG PO TABS
1000.0000 ug | ORAL_TABLET | Freq: Every day | ORAL | Status: DC
  Administered 2024-04-27: 1000 ug via ORAL
  Filled 2024-04-27: qty 1

## 2024-04-27 MED ORDER — CARVEDILOL 12.5 MG PO TABS
12.5000 mg | ORAL_TABLET | Freq: Two times a day (BID) | ORAL | Status: DC
  Administered 2024-04-27: 12.5 mg via ORAL
  Filled 2024-04-27: qty 1

## 2024-04-27 MED ORDER — ONDANSETRON HCL 4 MG/2ML IJ SOLN: 4.0000 mg | Freq: Four times a day (QID) | INTRAMUSCULAR | Status: DC | PRN

## 2024-04-27 MED ORDER — SODIUM CHLORIDE 0.9 % IV SOLN
1.0000 g | Freq: Once | INTRAVENOUS | Status: AC
  Administered 2024-04-27: 1 g via INTRAVENOUS
  Filled 2024-04-27: qty 10

## 2024-04-27 MED ORDER — ACIDOPHILUS PO CAPS: 1.0000 | ORAL_CAPSULE | Freq: Every day | ORAL | Status: DC

## 2024-04-27 MED ORDER — HYDRALAZINE HCL 10 MG PO TABS
10.0000 mg | ORAL_TABLET | Freq: Three times a day (TID) | ORAL | Status: DC
  Administered 2024-04-27: 10 mg via ORAL
  Filled 2024-04-27: qty 1

## 2024-04-27 MED ORDER — CLONIDINE HCL 0.1 MG PO TABS: 0.2000 mg | ORAL_TABLET | Freq: Every day | ORAL | Status: DC

## 2024-04-27 MED ORDER — ACETAMINOPHEN 650 MG RE SUPP: 650.0000 mg | Freq: Four times a day (QID) | RECTAL | Status: DC | PRN

## 2024-04-27 MED ORDER — ALPRAZOLAM 0.5 MG PO TABS
0.5000 mg | ORAL_TABLET | Freq: Three times a day (TID) | ORAL | Status: DC | PRN
  Administered 2024-04-27: 0.5 mg via ORAL
  Filled 2024-04-27: qty 1

## 2024-04-27 MED ORDER — POLYETHYLENE GLYCOL 3350 17 G PO PACK: 17.0000 g | PACK | Freq: Every day | ORAL | Status: DC | PRN

## 2024-04-27 MED ORDER — NITROFURANTOIN MONOHYD MACRO 100 MG PO CAPS
100.0000 mg | ORAL_CAPSULE | Freq: Two times a day (BID) | ORAL | 0 refills | Status: DC
  Filled 2024-04-27: qty 10, 5d supply, fill #0

## 2024-04-27 MED ORDER — D-MANNOSE 500 MG PO CAPS: 500.0000 mg | ORAL_CAPSULE | Freq: Two times a day (BID) | ORAL | Status: DC

## 2024-04-27 MED ORDER — MIRTAZAPINE 15 MG PO TABS: 15.0000 mg | ORAL_TABLET | Freq: Every day | ORAL | Status: DC

## 2024-04-27 MED ORDER — VITAMIN D 25 MCG (1000 UNIT) PO TABS: 5000.0000 [IU] | ORAL_TABLET | Freq: Every day | ORAL | Status: DC

## 2024-04-27 MED ORDER — DIPHENHYDRAMINE HCL 25 MG PO CAPS
25.0000 mg | ORAL_CAPSULE | Freq: Once | ORAL | Status: AC
  Administered 2024-04-27: 25 mg via ORAL
  Filled 2024-04-27: qty 1

## 2024-04-27 MED ORDER — OMEGA-3-ACID ETHYL ESTERS 1 G PO CAPS
2.0000 | ORAL_CAPSULE | Freq: Two times a day (BID) | ORAL | Status: DC
  Administered 2024-04-27: 2 g via ORAL
  Filled 2024-04-27: qty 2

## 2024-04-27 MED ORDER — METOCLOPRAMIDE HCL 5 MG/ML IJ SOLN
10.0000 mg | Freq: Once | INTRAMUSCULAR | Status: AC
  Administered 2024-04-27: 10 mg via INTRAVENOUS
  Filled 2024-04-27: qty 2

## 2024-04-27 MED ORDER — VITAMIN D3 25 MCG (1000 UNIT) PO TABS
5000.0000 [IU] | ORAL_TABLET | Freq: Every day | ORAL | Status: DC
  Administered 2024-04-27: 5000 [IU] via ORAL
  Filled 2024-04-27 (×3): qty 5

## 2024-04-27 MED ORDER — PANTOPRAZOLE SODIUM 40 MG PO TBEC
40.0000 mg | DELAYED_RELEASE_TABLET | Freq: Every day | ORAL | Status: DC
  Administered 2024-04-27: 40 mg via ORAL
  Filled 2024-04-27: qty 1

## 2024-04-27 MED ORDER — ROSUVASTATIN CALCIUM 5 MG PO TABS
10.0000 mg | ORAL_TABLET | Freq: Every day | ORAL | Status: DC
  Administered 2024-04-27: 10 mg via ORAL
  Filled 2024-04-27: qty 2

## 2024-04-27 MED ORDER — ACETAMINOPHEN 500 MG PO TABS
1000.0000 mg | ORAL_TABLET | Freq: Once | ORAL | Status: AC
  Administered 2024-04-28: 1000 mg via ORAL
  Filled 2024-04-27: qty 2

## 2024-04-27 MED ORDER — ONDANSETRON HCL 4 MG PO TABS: 4.0000 mg | ORAL_TABLET | Freq: Four times a day (QID) | ORAL | Status: DC | PRN

## 2024-04-27 MED ORDER — COLESEVELAM HCL 625 MG PO TABS
1250.0000 mg | ORAL_TABLET | Freq: Two times a day (BID) | ORAL | Status: DC
  Administered 2024-04-27: 1250 mg via ORAL
  Filled 2024-04-27 (×3): qty 2

## 2024-04-27 MED ORDER — ALPRAZOLAM 0.5 MG PO TABS: 0.5000 mg | ORAL_TABLET | Freq: Two times a day (BID) | ORAL | Status: DC | PRN

## 2024-04-27 MED ORDER — SACCHAROMYCES BOULARDII 250 MG PO CAPS: 250.0000 mg | ORAL_CAPSULE | Freq: Every day | ORAL | Status: DC

## 2024-04-27 MED ORDER — ACETAMINOPHEN 325 MG PO TABS
650.0000 mg | ORAL_TABLET | Freq: Four times a day (QID) | ORAL | Status: DC | PRN
  Administered 2024-04-27: 650 mg via ORAL
  Filled 2024-04-27: qty 2

## 2024-04-27 MED ORDER — OLANZAPINE 5 MG PO TBDP: 5.0000 mg | ORAL_TABLET | Freq: Three times a day (TID) | ORAL | Status: DC | PRN

## 2024-04-27 MED ORDER — BUSPIRONE HCL 15 MG PO TABS
30.0000 mg | ORAL_TABLET | Freq: Two times a day (BID) | ORAL | Status: DC
  Administered 2024-04-27: 30 mg via ORAL
  Filled 2024-04-27: qty 2

## 2024-04-27 MED ORDER — ALUM & MAG HYDROXIDE-SIMETH 200-200-20 MG/5ML PO SUSP: 30.0000 mL | ORAL | Status: DC | PRN

## 2024-04-27 MED ORDER — POLYETHYL GLYCOL-PROPYL GLYCOL 0.4-0.3 % OP SOLN: 1.0000 [drp] | Freq: Three times a day (TID) | OPHTHALMIC | Status: DC | PRN

## 2024-04-27 MED ORDER — MAGNESIUM HYDROXIDE 400 MG/5ML PO SUSP: 30.0000 mL | Freq: Every day | ORAL | Status: DC | PRN

## 2024-04-27 MED ORDER — LEVOTHYROXINE SODIUM 100 MCG PO TABS: 100.0000 ug | ORAL_TABLET | Freq: Every day | ORAL | Status: DC

## 2024-04-27 MED ORDER — ASPIRIN 81 MG PO TBEC
81.0000 mg | DELAYED_RELEASE_TABLET | Freq: Every day | ORAL | Status: DC
  Administered 2024-04-27: 81 mg via ORAL
  Filled 2024-04-27: qty 1

## 2024-04-27 MED ORDER — POLYVINYL ALCOHOL 1.4 % OP SOLN: 1.0000 [drp] | Freq: Three times a day (TID) | OPHTHALMIC | Status: DC | PRN

## 2024-04-27 MED ORDER — OLANZAPINE 10 MG IM SOLR: 5.0000 mg | Freq: Three times a day (TID) | INTRAMUSCULAR | Status: DC | PRN

## 2024-04-27 MED ORDER — POLYSACCHARIDE IRON COMPLEX 150 MG PO CAPS
150.0000 mg | ORAL_CAPSULE | Freq: Every day | ORAL | Status: DC
  Administered 2024-04-27: 150 mg via ORAL
  Filled 2024-04-27: qty 1

## 2024-04-27 MED ORDER — ACETAMINOPHEN 500 MG PO TABS
1000.0000 mg | ORAL_TABLET | Freq: Four times a day (QID) | ORAL | Status: DC | PRN
  Administered 2024-04-28: 1000 mg via ORAL
  Filled 2024-04-27: qty 2

## 2024-04-27 NOTE — ED Notes (Signed)
 Patient was given food once arriving on the unit.

## 2024-04-27 NOTE — ED Provider Notes (Addendum)
 [facility] Continuous Assessment Admission H&P  Date: 04/27/2024 Patient Name: [name] MRN: [id number] Chief Complaint: worsening depressive symptoms & SI.  Diagnoses:  Final diagnoses:  MDD (major depressive disorder), recurrent severe, without psychosis (HCC)  GAD (generalized anxiety disorder)   HPI: [name] is a 73 y.o. female with a prior history of bipolar d/o who presents to the [facility] with complaints of worsening depressive symptoms, & endorsing suicidal ideations with a plan to overdose on pills.  Assessment & ROS: During encounter with pt, she specifically states: If I go back today, I will crawl into my closet, after taking a bunch of pills, and curl up in a ball go to sleep and never wake up. I had a wonderful life. She persistently endorses suicidal ideations with a plan to overdose on pills through entire encounter, even though she states that she does not have access to multiple pills to accomplish this because the staff at the [facility] where she resides administers her medications for her.   Patient reports a prior mental health related hospitalization at the [facility] 9 yrs ago under the care of Dr Rudy, during which she was started on Prazac, and is currently on 40 mg. She shares that she was also started on Remeron  during that visit and is currently on 45 mg (recently adjusted by her NP at the skilled nsg facility). Pt shares that David Gutterman, PHD who works with [facility] is her therapist, and asked her to present to this location for an evaluation after she verbalized suicidal ideations with a plan to him. She shares that her son who works as [employer] at [employer] dropped her off here and is her POA. She provided his names and phone number as [name], [phone], respectively.   Patient reports that depressive symptoms began worsening a month ago, after her roommate was switched; she  reports being switched over to a room with a roommate who is dependent on others for care, reports that the roommate is incontinent of bowels and bladder, and has a sacral decubitus, and when she is is being changed, is malodorous, and also renders her environment disruptive since the nursing staff turns on the light and the noise that the  RM and the staff makes bothers her. She shares that she also does not like the fact that the room mate calls her multiple times a day, to help her with things such as turning her and to go get the nursing staff to assist with her ADLs.  Patient reports depressive symptoms, including anhedonia, decreased energy levels, poor concentration, but always seem to be situational, related to the roommate; clinical research associate asked patient multiple times if her situation will change for the better if she were to be assigned another roommate who is more functional, and she states yes. Writer then asked pt if the suicidal ideations would resolve if she had her own room at the skilled nursing facility, and she stated yes. She however, stated that she still felt as though her medications needed to be twigged because I've been on them for too long and they are not helping me any more. She talks about being  on Abilify and Latuda  in the distant past and how those meds did not  help either.   Patient then proceeds to being very circumstantial, presents with flight of ideas, is hyperverbal, appears anxious, restless.  Talks about multiple things including being a cancer survivor, having  multiple surgeries with multiple hospital stays in the past, talked about how he is going as a psychiatrist we did [place] system and working at [facility] hospital, talked about her oldest son whom she has not seen in 3 years, who is a substance abuser.  Proceeded to talking about her faith in God, and how her grandfather had a nervous breakdown, and was admitted to the [facility] hospital in the past, amongst  other topics.  Pt with flan anxious and a  depressed mood, attention to personal hygiene and grooming is fair, eye contact is good, speech is clear & coherent. Thought contents are organized and logical, and pt currently endorses SI, with a plan to overdose on pills, and is not contracting for safety outside of a controlled environment at this time. She denies HI/AVH or paranoia. There is no evidence of delusional thoughts. Denies paranoia and denies delusional thinking.  Ambulatory with a rollator, but gait is pretty steady, and is independent with ADLs.  Denies any recent falls.  Medical history is significant for hypertension, history of bladder CA, in complete remission, hypercholesterolemia, hypothyroidism, and GERD.  Suicide Risk Assessment  SUICIDE RISK:  Severe:  Frequent, intense, and enduring suicidal ideation, specific plan prior to hospitalization, no subjective intent, but some objective markers of intent (i.e., choice of lethal method), the method is accessible, presenting with severe dysphoria/symptomatology, multiple risk factors present, such as age, history of mental health hospitalization, etc and few if any protective factors.   Medical Decision Making  -I certify that inpatient hospitalization would reasonably improve patient's mental status.  - Baseline labs included CMP, CBC, lipid panel, TSH, baseline EKG, cardiac medications as listed above-Difficult to draw as per nursing, and will be drawn in the [facility].   **- Reached out to [facility] for availability of geropsych bed, which was not available at time of presentation to the facility, & decision made to keep in the observation area overnight, and proceed with geropsych placement tomorrow (12/13) morning pending discharges as per [facility]. However, after patient was placed on the Observation unit, she persistently became uncomfortable, and restless due to inability to sleep comfortably on the unit recliners & the noise as well as the inability  to turn off the lights since area is a shared area. It was at this point that writer reached out to Dr Bari at the [facility] in an effort to seek assistance with getting pt a bed on the  TCU where patient can rest comfortably until she can be transferred to the [facility] unit tomorrow. The [facility] is aware and agreeable to transfer patient to Kathrine at [facility] tomorrow, and Psychiatry does not need to be consulted again prior to transfer.  -Home meds restarted as listed above un der Medications list. -I adjusted home meds as follows: -Xanax  reduced from 1 mg TID to 0.5 mg TID PRN for anxiety, as pt states that she typically does not take this medication a lot, and typically takes just once in a few days. Reduced Remeron  from 45 mg nightly to 15 mg nightly to maximize sleep effects. I eliminated the Prozac  due to hypomania-(pt hyperverbal, speech rapid), and unsure if this is a contributor.  Recommendations  Based on my evaluation the patient appears to have an emergency mental health condition for which I recommend the patient be transferred to an inpatient behavioral health unit for treatment and stabilization. Accepted to the [facility] for 12/13, after discharges on that unit.  Total Time spent with patient: 1.5 hours  Musculoskeletal  Strength & Muscle Tone: within normal limits Gait & Station: normal Patient leans: N/A  Psychiatric Specialty Exam  Presentation General Appearance: Well Groomed  Eye Contact:Fair  Speech:Clear and Coherent  Speech Volume:Normal  Handedness:Right   Mood and Affect  Mood:Anxious; Depressed  Affect:Congruent   Thought Process  Thought Processes:Coherent  Descriptions of Associations:Intact  Orientation:Full (Time, Place and Person)  Thought Content:Logical    Hallucinations:Hallucinations: None  Ideas of Reference:None  Suicidal Thoughts:Suicidal Thoughts: Yes, Active SI Active Intent and/or Plan: With  Plan  Homicidal Thoughts:Homicidal Thoughts: No   Sensorium  Memory:Immediate Fair  Judgment:Fair  Insight:Fair   Executive Functions  Concentration:Good  Attention Span:Good  Recall:Good  Fund of Knowledge:Good  Language:Good   Psychomotor Activity  Psychomotor Activity:Psychomotor Activity: Normal   Assets  Assets:Desire for Improvement; Resilience   Sleep  Sleep:Sleep: Poor   Nutritional Assessment (For OBS and FBC admissions only) Has the patient had a weight loss or gain of 10 pounds or more in the last 3 months?: No Has the patient had a decrease in food intake/or appetite?: No Does the patient have dental problems?: No Does the patient have eating habits or behaviors that may be indicators of an eating disorder including binging or inducing vomiting?: No Has the patient recently lost weight without trying?: 0 Has the patient been eating poorly because of a decreased appetite?: 0 Malnutrition Screening Tool Score: 0    Physical Exam Neurological:     General: No focal deficit present.     Mental Status: She is oriented to person, place, and time.  Psychiatric:        Mood and Affect: Mood normal.        Behavior: Behavior normal.        Judgment: Judgment normal.    Review of Systems  Psychiatric/Behavioral:  Positive for depression and suicidal ideas. Negative for hallucinations, memory loss and substance abuse. The patient is nervous/anxious and has insomnia.   All other systems reviewed and are negative.   Blood pressure 122/63, pulse 74, temperature 97.9 F (36.6 C), resp. rate 16, SpO2 97%. There is no height or weight on file to calculate BMI.  Past Psychiatric History: bipolar d/o   Is the patient at risk to self? Yes  Has the patient been a risk to self in the past 6 months? Yes .    Has the patient been a risk to self within the distant past? Yes   Is the patient a risk to others? No   Has the patient been a risk to others in the  past 6 months? No   Has the patient been a risk to others within the distant past? 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 Has 2 children (men).  Last Labs:  Admission on 04/27/2024  Component Date Value Ref Range Status   POC Amphetamine UR 04/27/2024 None Detected  NONE DETECTED (Cut Off Level 1000 ng/mL) Final   POC Secobarbital (BAR) 04/27/2024 None Detected  NONE DETECTED (Cut Off Level 300 ng/mL) Final   POC Buprenorphine (BUP) 04/27/2024 None Detected  NONE DETECTED (Cut Off Level 10 ng/mL) Final   POC Oxazepam (BZO) 04/27/2024 Positive (A)  NONE DETECTED (Cut Off Level 300 ng/mL) Final   POC Cocaine UR 04/27/2024 None Detected  NONE DETECTED (Cut Off Level  300 ng/mL) Final   POC Methamphetamine UR 04/27/2024 None Detected  NONE DETECTED (Cut Off Level 1000 ng/mL) Final   POC Morphine  04/27/2024 None Detected  NONE DETECTED (Cut Off Level 300 ng/mL) Final   POC Methadone UR 04/27/2024 None Detected  NONE DETECTED (Cut Off Level 300 ng/mL) Final   POC Oxycodone  UR 04/27/2024 None Detected  NONE DETECTED (Cut Off Level 100 ng/mL) Final   POC Marijuana UR 04/27/2024 None Detected  NONE DETECTED (Cut Off Level 50 ng/mL) Final  Office Visit on 03/06/2024  Component Date Value Ref Range Status   Glucose, UA 03/06/2024 Negative  Negative Final   Bilirubin, UA 03/06/2024 negative   Final   Ketones, UA 03/06/2024 negative   Final   Spec Grav, UA 03/06/2024 1.010  1.010 - 1.025 Final   Blood, UA 03/06/2024 negative   Final   pH, UA 03/06/2024 6.0  5.0 - 8.0 Final   Protein, UA 03/06/2024 Negative  Negative Final   Urobilinogen, UA 03/06/2024 0.2 (A)  0.2 or 1.0 E.U./dL Final   Nitrite, UA 89/78/7974 positive   Final   Leukocytes, UA 03/06/2024 Small (1+) (A)  Negative Final  Admission on 02/11/2024, Discharged on 02/11/2024  Component Date  Value Ref Range Status   WBC 02/11/2024 6.5  4.0 - 10.5 K/uL Final   RBC 02/11/2024 3.84 (L)  3.87 - 5.11 MIL/uL Final   Hemoglobin 02/11/2024 11.9 (L)  12.0 - 15.0 g/dL Final   HCT 90/72/7974 36.1  36.0 - 46.0 % Final   MCV 02/11/2024 94.0  80.0 - 100.0 fL Final   MCH 02/11/2024 31.0  26.0 - 34.0 pg Final   MCHC 02/11/2024 33.0  30.0 - 36.0 g/dL Final   RDW 90/72/7974 13.2  11.5 - 15.5 % Final   Platelets 02/11/2024 272  150 - 400 K/uL Final   nRBC 02/11/2024 0.0  0.0 - 0.2 % Final   Performed at [facility], [address], [place], [place] [address]   Sodium 02/11/2024 140  135 - 145 mmol/L Final   Potassium 02/11/2024 3.6  3.5 - 5.1 mmol/L Final   Chloride 02/11/2024 107  98 - 111 mmol/L Final   CO2 02/11/2024 20 (L)  22 - 32 mmol/L Final   Glucose, Bld 02/11/2024 111 (H)  70 - 99 mg/dL Final   Glucose reference range applies only to samples taken after fasting for at least 8 hours.   BUN 02/11/2024 17  8 - 23 mg/dL Final   Creatinine, Ser 02/11/2024 1.11 (H)  0.44 - 1.00 mg/dL Final   Calcium  02/11/2024 9.0  8.9 - 10.3 mg/dL Final   GFR, Estimated 02/11/2024 52 (L)  >60 mL/min Final   Comment: (NOTE) Calculated using the CKD-EPI Creatinine Equation (2021)    Anion gap 02/11/2024 13  5 - 15 Final   Performed at [facility], [address] [place], [place] [address]   Troponin I (High Sensitivity) 02/11/2024 5  <18 ng/L Final   Comment: (NOTE) Elevated high sensitivity troponin I (hsTnI) values and significant  changes across serial measurements may suggest ACS but many other  chronic and acute conditions are known to elevate hsTnI results.  Refer to the Links section for chest pain algorithms and additional  guidance. Performed at [facility], [address], [place], [place] [address]   Office Visit on 02/09/2024  Component Date Value Ref Range Status   Source: 02/09/2024 URINE   Final   Status: 02/09/2024  FINAL   Final   Isolate 1: 02/09/2024  Klebsiella pneumoniae (A)   Final   Greater than 100,000 CFU/mL of Klebsiella pneumoniae   Sodium 02/09/2024 138  135 - 145 mEq/L Final   Potassium 02/09/2024 3.9  3.5 - 5.1 mEq/L Final   Chloride 02/09/2024 103  96 - 112 mEq/L Final   CO2 02/09/2024 24  19 - 32 mEq/L Final   Glucose, Bld 02/09/2024 92  70 - 99 mg/dL Final   BUN 90/74/7974 20  6 - 23 mg/dL Final   Creatinine, Ser 02/09/2024 1.21 (H)  0.40 - 1.20 mg/dL Final   Total Bilirubin 02/09/2024 0.5  0.2 - 1.2 mg/dL Final   Alkaline Phosphatase 02/09/2024 85  39 - 117 U/L Final   AST 02/09/2024 22  0 - 37 U/L Final   ALT 02/09/2024 15  0 - 35 U/L Final   Total Protein 02/09/2024 7.4  6.0 - 8.3 g/dL Final   Albumin  02/09/2024 4.7  3.5 - 5.2 g/dL Final   GFR 90/74/7974 44.29 (L)  >60.00 mL/min Final   Calculated using the CKD-EPI Creatinine Equation (2021)   Calcium  02/09/2024 9.3  8.4 - 10.5 mg/dL Final   Color, Urine 90/74/7974 YELLOW  Yellow;Lt. Yellow;Straw;Dark Yellow;Amber;Green;Red;Brown Final   APPearance 02/09/2024 Cloudy (A)  Clear;Turbid;Slightly Cloudy;Cloudy Final   Specific Gravity, Urine 02/09/2024 <=1.005 (A)  1.000 - 1.030 Final   pH 02/09/2024 6.0  5.0 - 8.0 Final   Total Protein, Urine 02/09/2024 NEGATIVE  Negative Final   Urine Glucose 02/09/2024 NEGATIVE  Negative Final   Ketones, ur 02/09/2024 NEGATIVE  Negative Final   Bilirubin Urine 02/09/2024 NEGATIVE  Negative Final   Hgb urine dipstick 02/09/2024 NEGATIVE  Negative Final   Urobilinogen, UA 02/09/2024 0.2  0.0 - 1.0 Final   Leukocytes,Ua 02/09/2024 LARGE (A)  Negative Final   Nitrite 02/09/2024 POSITIVE (A)  Negative Final   WBC, UA 02/09/2024 TNTC(>50/hpf) (A)  0-2/hpf Final   RBC / HPF 02/09/2024 0-2/hpf  0-2/hpf Final   Squamous Epithelial / HPF 02/09/2024 Rare(0-4/hpf)  Rare(0-4/hpf) Final   Renal Epithel, UA 02/09/2024 Rare(0-4/hpf) (A)  None Final   Bacteria, UA 02/09/2024 Many(>50/hpf) (A)  None Final  Office Visit on 01/03/2024  Component  Date Value Ref Range Status   TSH 01/03/2024 3.90  0.35 - 5.50 uIU/mL Final   Hgb A1c MFr Bld 01/03/2024 6.0  4.6 - 6.5 % Final   Glycemic Control Guidelines for People with Diabetes:Non Diabetic:  <6%Goal of Therapy: <7%Additional Action Suggested:  >8%    Sodium 01/03/2024 137  135 - 145 mEq/L Final   Potassium 01/03/2024 4.4  3.5 - 5.1 mEq/L Final   Chloride 01/03/2024 104  96 - 112 mEq/L Final   CO2 01/03/2024 24  19 - 32 mEq/L Final   Glucose, Bld 01/03/2024 100 (H)  70 - 99 mg/dL Final   BUN 91/80/7974 32 (H)  6 - 23 mg/dL Final   Creatinine, Ser 01/03/2024 1.27 (H)  0.40 - 1.20 mg/dL Final   Total Bilirubin 01/03/2024 0.4  0.2 - 1.2 mg/dL Final   Alkaline Phosphatase 01/03/2024 94  39 - 117 U/L Final   AST 01/03/2024 16  0 - 37 U/L Final   ALT 01/03/2024 14  0 - 35 U/L Final   Total Protein 01/03/2024 7.4  6.0 - 8.3 g/dL Final   Albumin  01/03/2024 4.4  3.5 - 5.2 g/dL Final   GFR 91/80/7974 41.82 (L)  >60.00 mL/min Final   Calculated using the  CKD-EPI Creatinine Equation (2021)   Calcium  01/03/2024 9.1  8.4 - 10.5 mg/dL Final   WBC 91/80/7974 7.2  4.0 - 10.5 K/uL Final   RBC 01/03/2024 3.99  3.87 - 5.11 Mil/uL Final   Hemoglobin 01/03/2024 12.4  12.0 - 15.0 g/dL Final   HCT 91/80/7974 36.6  36.0 - 46.0 % Final   MCV 01/03/2024 91.7  78.0 - 100.0 fl Final   MCHC 01/03/2024 33.9  30.0 - 36.0 g/dL Final   RDW 91/80/7974 13.4  11.5 - 15.5 % Final   Platelets 01/03/2024 353.0  150.0 - 400.0 K/uL Final   Neutrophils Relative % 01/03/2024 73.1  43.0 - 77.0 % Final   Lymphocytes Relative 01/03/2024 15.5  12.0 - 46.0 % Final   Monocytes Relative 01/03/2024 6.3  3.0 - 12.0 % Final   Eosinophils Relative 01/03/2024 4.0  0.0 - 5.0 % Final   Basophils Relative 01/03/2024 1.1  0.0 - 3.0 % Final   Neutro Abs 01/03/2024 5.2  1.4 - 7.7 K/uL Final   Lymphs Abs 01/03/2024 1.1  0.7 - 4.0 K/uL Final   Monocytes Absolute 01/03/2024 0.5  0.1 - 1.0 K/uL Final   Eosinophils Absolute 01/03/2024 0.3   0.0 - 0.7 K/uL Final   Basophils Absolute 01/03/2024 0.1  0.0 - 0.1 K/uL Final  Hospital Outpatient Visit on 11/23/2023  Component Date Value Ref Range Status   Creatinine, Ser 11/23/2023 1.30 (H)  0.44 - 1.00 mg/dL Final  Office Visit on 11/14/2023  Component Date Value Ref Range Status   LDL Direct 11/14/2023 64  0 - 99 mg/dL Final    Allergies: Morphine  and codeine, Amlodipine , Auvelity  [dextromethorphan -bupropion  er], Buprenorphine, Cephalexin , Other, Sulfamethoxazole -trimethoprim , Sumatriptan , Codeine, Dust mite extract, Molds & smuts, and Tape  Medications:  Facility Ordered Medications  Medication   acetaminophen  (TYLENOL ) tablet 650 mg   alum & mag hydroxide-simeth (MAALOX/MYLANTA) 200-200-20 MG/5ML suspension 30 mL   magnesium  hydroxide (MILK OF MAGNESIA) suspension 30 mL   OLANZapine zydis (ZYPREXA) disintegrating tablet 5 mg   OLANZapine (ZYPREXA) injection 5 mg   aspirin  EC tablet 81 mg   busPIRone  (BUSPAR ) tablet 30 mg   carvedilol  (COREG ) tablet 12.5 mg   cholecalciferol  (VITAMIN D3) tablet 5,000 Units   cloNIDine  (CATAPRES ) tablet 0.2 mg   colesevelam  (WELCHOL ) tablet 1,250 mg   cyanocobalamin  (VITAMIN B12) tablet 1,000 mcg   pantoprazole  (PROTONIX ) EC tablet 40 mg   hydrALAZINE  (APRESOLINE ) tablet 10 mg   iron  polysaccharides (NIFEREX) capsule 150 mg   [START ON 04/28/2024] levothyroxine  (SYNTHROID ) tablet 100 mcg   mirtazapine  (REMERON ) tablet 15 mg   omega-3 acid ethyl esters (LOVAZA ) capsule 2 g   rosuvastatin  (CRESTOR ) tablet 10 mg   ALPRAZolam  (XANAX ) tablet 0.5 mg   artificial tears ophthalmic solution 1 drop   [START ON 04/28/2024] saccharomyces boulardii (FLORASTOR) capsule 250 mg   PTA Medications  Medication Sig   cloNIDine  (CATAPRES ) 0.2 MG tablet Take 1 tablet (0.2 mg total) by mouth at bedtime.   traMADol  (ULTRAM ) 50 MG tablet Take 1 tablet (50 mg total) by mouth every 6 (six) hours as needed for severe pain (pain score 7-10).   rosuvastatin   (CRESTOR ) 10 MG tablet Take 1 tablet (10 mg total) by mouth daily.   hydrALAZINE  (APRESOLINE ) 10 MG tablet Take 1 tablet by mouth 3 times daily. MAY TAKE EXTRA 10MG  FOR SBP>160 (Patient taking differently: Take 10 mg by mouth 3 (three) times daily.)   busPIRone  (BUSPAR ) 30 MG tablet Take 1 tablet (30 mg  total) by mouth 2 (two) times daily.   carvedilol  (COREG ) 12.5 MG tablet Take 1 tablet (12.5 mg total) by mouth every evening. (Patient taking differently: Take 12.5 mg by mouth in the morning and at bedtime.)   acetaminophen  (TYLENOL ) 500 MG tablet Take 500 mg by mouth 2 (two) times daily.   colesevelam  (WELCHOL ) 625 MG tablet Take 2 tablets (1,250 mg total) by mouth 2 (two) times daily with a meal.   aspirin  EC (ASPIRIN  LOW DOSE) 81 MG tablet Take 1 tablet (81 mg total) by mouth daily. Swallow whole.   levothyroxine  (SYNTHROID ) 100 MCG tablet Take 1 tablet (100 mcg total) by mouth daily before breakfast.   ALPRAZolam  (XANAX ) 1 MG tablet Take 1 tablet (1 mg total) by mouth 3 (three) times daily. (Patient taking differently: Take 1 mg by mouth 3 (three) times daily. Home is only giving her twice daily instead of TID)   ascorbic acid  (VITAMIN C ) 500 MG tablet Take 1 tablet (500 mg total) by mouth daily.   Cholecalciferol  (D3 5000) 125 MCG (5000 UT) capsule Take 1 capsule (5,000 Units total) by mouth daily.   cyanocobalamin  (VITAMIN B12) 1000 MCG tablet Take 1 tablet (1,000 mcg total) by mouth daily. Take 1,000 mcg by mouth daily.   D-Mannose 500 MG CAPS Take 1 capsule (500 mg total) by mouth 2 (two) times daily. WITH CRANBERRY & DANDELION EXTRACT   loperamide  (IMODIUM  A-D) 2 MG tablet Take 1 tablet (2 mg total) by mouth 4 (four) times daily as needed for diarrhea or loose stools.   Metamucil Fiber 2 g CHEW Chew 2 tablets by mouth in the morning and at bedtime.   omega-3 acid ethyl esters (LOVAZA ) 1 g capsule Take 2 capsules (2 g total) by mouth 2 (two) times daily.   sodium chloride  (OCEAN) 0.65 % SOLN  nasal spray Place 1 spray into both nostrils daily.   FLUoxetine  (PROZAC ) 40 MG capsule Take 1 capsule (40 mg total) by mouth daily.   nystatin  (MYCOSTATIN /NYSTOP ) powder Use every day on rash (Patient taking differently: Apply 1 Application topically at bedtime. Apply to under bilateral breast for moisture)   diphenoxylate -atropine  (LOMOTIL ) 2.5-0.025 MG tablet Take 1 tablet by mouth 4 (four) times daily as needed for diarrhea or loose stools.   omeprazole  (PRILOSEC) 20 MG capsule Take 20 mg by mouth daily.    Total Time Spent in Direct Patient Care:  I personally spent 75 minutes on the unit in direct patient care. The direct patient care time included face-to-face time with the patient, reviewing the patient's chart, communicating with other professionals, and coordinating care. Greater than 50% of this time was spent in counseling or coordinating care with the patient regarding goals of hospitalization, psycho-education, and discharge planning needs.    Donia Snell, NP 04/27/2024  6:50 PM

## 2024-04-27 NOTE — ED Notes (Signed)
 Patient transferred to [facility] per provider order. Patient discharged in no acute distress, A& O x4 and ambulatory. Patient denied SI/HI, A/VH upon discharge. Patient verbalized understanding of admission as explained by staff. Patient mood fair. Patient belongings returned to patient from locker #29 complete and intact. Patient escorted to back sallyport via staff for transport to destination. Safety maintained.

## 2024-04-27 NOTE — ED Provider Notes (Incomplete)
 [place] EMERGENCY DEPARTMENT AT [facility] Provider Note   CSN: [id number] Arrival date & time: 04/27/24  1906     Patient presents with: Dysuria   [name] is a 73 y.o. female.  {Add pertinent medical, surgical, social history, OB history to HPI:32947}  Dysuria  Patient is a 73 year old female with past medical history significant for bipolar, DVT, asthma, HLD, hypothyroidism, MDD  Patient presents emergency room today with complaints of urinary frequency urgency and dysuria.        Prior to Admission medications  Medication Sig Start Date End Date Taking? Authorizing Provider  nitrofurantoin , macrocrystal-monohydrate, (MACROBID ) 100 MG capsule Take 1 capsule (100 mg total) by mouth 2 (two) times daily. 04/27/24  Yes Cambria Osten S, PA  acetaminophen  (TYLENOL ) 500 MG tablet Take 500 mg by mouth 2 (two) times daily.    [provider]  ALPRAZolam  (XANAX ) 1 MG tablet Take 1 tablet (1 mg total) by mouth 3 (three) times daily. Patient taking differently: Take 1 mg by mouth 3 (three) times daily. Home is only giving her twice daily instead of TID 01/24/24   Plotnikov, Aleksei V, MD  ascorbic acid  (VITAMIN C ) 500 MG tablet Take 1 tablet (500 mg total) by mouth daily. 01/24/24   Plotnikov, [name], MD  aspirin  EC (ASPIRIN  LOW DOSE) 81 MG tablet Take 1 tablet (81 mg total) by mouth daily. Swallow whole. 12/20/23   Plotnikov, Aleksei V, MD  busPIRone  (BUSPAR ) 30 MG tablet Take 1 tablet (30 mg total) by mouth 2 (two) times daily. 10/04/23   Plotnikov, Aleksei V, MD  carvedilol  (COREG ) 12.5 MG tablet Take 1 tablet (12.5 mg total) by mouth every evening. Patient taking differently: Take 12.5 mg by mouth in the morning and at bedtime. 11/14/23   Emelia Josefa HERO, NP  cetirizine (ZYRTEC) 10 MG tablet Take 10 mg by mouth daily.    [provider]  Cholecalciferol  (D3 5000) 125 MCG (5000 UT) capsule Take 1 capsule (5,000 Units total) by mouth daily.  01/24/24   Plotnikov, [name], MD  cloNIDine  (CATAPRES ) 0.2 MG tablet Take 1 tablet (0.2 mg total) by mouth at bedtime. 08/09/23   Tobb, Kardie, DO  colesevelam  (WELCHOL ) 625 MG tablet Take 2 tablets (1,250 mg total) by mouth 2 (two) times daily with a meal. 12/09/23   Plotnikov, [name], MD  cyanocobalamin  (VITAMIN B12) 1000 MCG tablet Take 1 tablet (1,000 mcg total) by mouth daily. Take 1,000 mcg by mouth daily. 01/24/24   Plotnikov, Aleksei V, MD  D-Mannose 500 MG CAPS Take 1 capsule (500 mg total) by mouth 2 (two) times daily. WITH CRANBERRY & DANDELION EXTRACT 01/24/24   Plotnikov, Aleksei V, MD  diphenoxylate -atropine  (LOMOTIL ) 2.5-0.025 MG tablet Take 1 tablet by mouth 4 (four) times daily as needed for diarrhea or loose stools. 03/09/24   Plotnikov, Aleksei V, MD  ferrous sulfate (FEROSUL) 325 (65 FE) MG tablet Take 325 mg by mouth daily.    [provider]  FLUoxetine  (PROZAC ) 40 MG capsule Take 1 capsule (40 mg total) by mouth daily. 01/24/24   Plotnikov, Aleksei V, MD  fluticasone  (FLONASE ) 50 MCG/ACT nasal spray Place 2 sprays into both nostrils daily.    [provider]  hydrALAZINE  (APRESOLINE ) 10 MG tablet Take 1 tablet by mouth 3 times daily. MAY TAKE EXTRA 10MG  FOR SBP>160 Patient taking differently: Take 10 mg by mouth 3 (three) times daily. 09/23/23   Emelia Josefa HERO, NP  levothyroxine  (SYNTHROID ) 100 MCG  tablet Take 1 tablet (100 mcg total) by mouth daily before breakfast. 01/13/24   Plotnikov, [name], MD  loperamide  (IMODIUM  A-D) 2 MG tablet Take 1 tablet (2 mg total) by mouth 4 (four) times daily as needed for diarrhea or loose stools. 01/24/24   Plotnikov, Aleksei V, MD  Metamucil Fiber 2 g CHEW Chew 2 tablets by mouth in the morning and at bedtime. 01/24/24   Plotnikov, Aleksei V, MD  mirtazapine  (REMERON ) 45 MG tablet Take 45 mg by mouth at bedtime.    [provider]  nystatin  (MYCOSTATIN /NYSTOP ) powder Use every day on rash Patient taking differently:  Apply 1 Application topically at bedtime. Apply to under bilateral breast for moisture 01/24/24   Plotnikov, Aleksei V, MD  omega-3 acid ethyl esters (LOVAZA ) 1 g capsule Take 2 capsules (2 g total) by mouth 2 (two) times daily. 01/24/24   Plotnikov, [name], MD  omeprazole  (PRILOSEC) 20 MG capsule Take 20 mg by mouth daily. 01/06/24   [provider]  pantoprazole  (PROTONIX ) 40 MG tablet Take 40 mg by mouth 2 (two) times daily.    [provider]  rosuvastatin  (CRESTOR ) 10 MG tablet Take 1 tablet (10 mg total) by mouth daily. 08/30/23 04/27/24  Tobb, Kardie, DO  Saccharomyces boulardii (PROBIOTIC) 250 MG CAPS Take 250 mg by mouth 2 (two) times daily.    [provider]  sodium chloride  (OCEAN) 0.65 % SOLN nasal spray Place 1 spray into both nostrils daily. 01/24/24   Plotnikov, Aleksei V, MD  traMADol  (ULTRAM ) 50 MG tablet Take 1 tablet (50 mg total) by mouth every 6 (six) hours as needed for severe pain (pain score 7-10). 08/17/23   Plotnikov, [name], MD  famotidine  (PEPCID ) 40 MG tablet TAKE 1 TABLET BY MOUTH EVERY DAY IN THE EVENING Patient not taking: No sig reported 04/03/19 11/05/19  Kozlow, Camellia PARAS, MD    Allergies: Morphine  and codeine, Amlodipine , Auvelity  [dextromethorphan -bupropion  er], Buprenorphine, Cephalexin , Other, Sulfamethoxazole -trimethoprim , Sumatriptan , Codeine, Dust mite extract, Molds & smuts, and Tape    Review of Systems  Genitourinary:  Positive for dysuria.    Updated Vital Signs BP (!) 154/98 (BP Location: Left Arm)   Pulse 75   Temp 97.7 F (36.5 C) (Oral)   Resp 18   SpO2 97%   Physical Exam  (all labs ordered are listed, but only abnormal results are displayed) Labs Reviewed  URINALYSIS, ROUTINE W REFLEX MICROSCOPIC - Abnormal; Notable for the following components:      Result Value   APPearance HAZY (*)    Specific Gravity, Urine 1.004 (*)    Leukocytes,Ua LARGE (*)    Bacteria, UA RARE (*)    All other components within normal  limits  COMPREHENSIVE METABOLIC PANEL WITH GFR - Abnormal; Notable for the following components:   Glucose, Bld 113 (*)    Creatinine, Ser 1.07 (*)    GFR, Estimated 54 (*)    All other components within normal limits  URINE CULTURE  CBC    EKG: None  Radiology: CT Renal Stone Study Result Date: 04/27/2024 EXAM: CT ABDOMEN AND PELVIS WITHOUT CONTRAST 04/27/2024 11:04:34 PM TECHNIQUE: CT of the abdomen and pelvis was performed without the administration of intravenous contrast. Multiplanar reformatted images are provided for review. Automated exposure control, iterative reconstruction, and/or weight-based adjustment of the mA/kV was utilized to reduce the radiation dose to as low as reasonably achievable. COMPARISON: None available. CLINICAL HISTORY: Abdominal/flank pain, stone suspected; UTI - concern for stone. FINDINGS: LOWER  CHEST: Subpleural scarring in the right lower lobe. LIVER: The liver is unremarkable. GALLBLADDER AND BILE DUCTS: Status post cholecystectomy. No biliary ductal dilatation. SPLEEN: No acute abnormality. PANCREAS: No acute abnormality. ADRENAL GLANDS: No acute abnormality. KIDNEYS, URETERS AND BLADDER: 2 mm nonobstructing left lower renal calculus. No stones in the right kidney or ureters. No hydronephrosis. No perinephric or periureteral stranding. Urinary bladder is unremarkable. GI AND BOWEL: Stomach demonstrates no acute abnormality. There is no bowel obstruction. Normal appendix (image 51). PERITONEUM AND RETROPERITONEUM: No ascites. No free air. VASCULATURE: Atherosclerotic calcifications of the abdominal aorta. LYMPH NODES: No lymphadenopathy. REPRODUCTIVE ORGANS: Status post hysterectomy. BONES AND SOFT TISSUES: Right hip arthroplasty. No acute osseous abnormality. No focal soft tissue abnormality. IMPRESSION: 1. 2 mm nonobstructing left lower renal calculus. No hydronephrosis. Electronically signed by: Pinkie Pebbles MD 04/27/2024 11:08 PM EST RP Workstation:  [id number]    {Document cardiac monitor, telemetry assessment procedure when appropriate:32947} Procedures   Medications Ordered in the ED  acetaminophen  (TYLENOL ) tablet 1,000 mg (has no administration in time range)  acetaminophen  (TYLENOL ) tablet 1,000 mg (has no administration in time range)    Or  acetaminophen  (TYLENOL ) suppository 650 mg (has no administration in time range)  polyethylene glycol (MIRALAX  / GLYCOLAX ) packet 17 g (has no administration in time range)  ondansetron  (ZOFRAN ) tablet 4 mg (has no administration in time range)    Or  ondansetron  (ZOFRAN ) injection 4 mg (has no administration in time range)  cefTRIAXone  (ROCEPHIN ) 1 g in sodium chloride  0.9 % 100 mL IVPB (0 g Intravenous Stopped 04/27/24 2322)  metoCLOPramide  (REGLAN ) injection 10 mg (10 mg Intravenous Given 04/27/24 2252)  diphenhydrAMINE  (BENADRYL ) capsule 25 mg (25 mg Oral Given 04/27/24 2251)      {Click here for ABCD2, HEART and other calculators REFRESH Note before signing:1}                              Medical Decision Making Amount and/or Complexity of Data Reviewed Labs: ordered. Radiology: ordered.  Risk OTC drugs. Prescription drug management.   ***  {Document critical care time when appropriate  Document review of labs and clinical decision tools ie CHADS2VASC2, etc  Document your independent review of radiology images and any outside records  Document your discussion with family members, caretakers and with consultants  Document social determinants of health affecting pt's care  Document your decision making why or why not admission, treatments were needed:32947:::1}   Final diagnoses:  Acute cystitis without hematuria    ED Discharge Orders          Ordered    nitrofurantoin , macrocrystal-monohydrate, (MACROBID ) 100 MG capsule  2 times daily        04/27/24 2325
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
## 2024-04-27 NOTE — ED Provider Notes (Signed)
 [place] EMERGENCY DEPARTMENT AT [facility] Provider Note   CSN: [id number] Arrival date & time: 04/27/24  1906     Patient presents with: Dysuria   [name] is a 73 y.o. female.    Dysuria  Patient is a 73 year old female with past medical history significant for bipolar, DVT, asthma, HLD, hypothyroidism, MDD  Patient presents emergency room today with complaints of urinary frequency urgency and dysuria. She is here from [facility] where she was being seen for mood issues.  No fever or nausea or vomiting. Some mild headache. No CP or SOB.       Prior to Admission medications  Medication Sig Start Date End Date Taking? Authorizing Provider  nitrofurantoin , macrocrystal-monohydrate, (MACROBID ) 100 MG capsule Take 1 capsule (100 mg total) by mouth 2 (two) times daily. 04/27/24  Yes Chandan Fly S, PA  acetaminophen  (TYLENOL ) 500 MG tablet Take 500 mg by mouth 2 (two) times daily.    [provider]  ALPRAZolam  (XANAX ) 1 MG tablet Take 1 tablet (1 mg total) by mouth 3 (three) times daily. Patient taking differently: Take 1 mg by mouth 3 (three) times daily. Home is only giving her twice daily instead of TID 01/24/24   Plotnikov, Aleksei V, MD  ascorbic acid  (VITAMIN C ) 500 MG tablet Take 1 tablet (500 mg total) by mouth daily. 01/24/24   Plotnikov, [name], MD  aspirin  EC (ASPIRIN  LOW DOSE) 81 MG tablet Take 1 tablet (81 mg total) by mouth daily. Swallow whole. 12/20/23   Plotnikov, [name], MD  busPIRone  (BUSPAR ) 30 MG tablet Take 1 tablet (30 mg total) by mouth 2 (two) times daily. 10/04/23   Plotnikov, Aleksei V, MD  carvedilol  (COREG ) 12.5 MG tablet Take 1 tablet (12.5 mg total) by mouth every evening. Patient taking differently: Take 12.5 mg by mouth in the morning and at bedtime. 11/14/23   Emelia Josefa HERO, NP  cetirizine (ZYRTEC) 10 MG tablet Take 10 mg by mouth daily.    [provider]  Cholecalciferol  (D3 5000) 125 MCG (5000 UT)  capsule Take 1 capsule (5,000 Units total) by mouth daily. 01/24/24   Plotnikov, [name], MD  cloNIDine  (CATAPRES ) 0.2 MG tablet Take 1 tablet (0.2 mg total) by mouth at bedtime. 08/09/23   Tobb, Kardie, DO  colesevelam  (WELCHOL ) 625 MG tablet Take 2 tablets (1,250 mg total) by mouth 2 (two) times daily with a meal. 12/09/23   Plotnikov, [name], MD  cyanocobalamin  (VITAMIN B12) 1000 MCG tablet Take 1 tablet (1,000 mcg total) by mouth daily. Take 1,000 mcg by mouth daily. 01/24/24   Plotnikov, Aleksei V, MD  D-Mannose 500 MG CAPS Take 1 capsule (500 mg total) by mouth 2 (two) times daily. WITH CRANBERRY & DANDELION EXTRACT 01/24/24   Plotnikov, Aleksei V, MD  diphenoxylate -atropine  (LOMOTIL ) 2.5-0.025 MG tablet Take 1 tablet by mouth 4 (four) times daily as needed for diarrhea or loose stools. 03/09/24   Plotnikov, [name], MD  ferrous sulfate  (FEROSUL) 325 (65 FE) MG tablet Take 325 mg by mouth daily.    [provider]  FLUoxetine  (PROZAC ) 40 MG capsule Take 1 capsule (40 mg total) by mouth daily. 01/24/24   Plotnikov, [name], MD  fluticasone  (FLONASE ) 50 MCG/ACT nasal spray Place 2 sprays into both nostrils daily.    [provider]  hydrALAZINE  (APRESOLINE ) 10 MG tablet Take 1 tablet by mouth 3 times daily. MAY TAKE EXTRA 10MG  FOR SBP>160 Patient taking differently: Take 10 mg by  mouth 3 (three) times daily. 09/23/23   Emelia Josefa HERO, NP  levothyroxine  (SYNTHROID ) 100 MCG tablet Take 1 tablet (100 mcg total) by mouth daily before breakfast. 01/13/24   Plotnikov, [name], MD  loperamide  (IMODIUM  A-D) 2 MG tablet Take 1 tablet (2 mg total) by mouth 4 (four) times daily as needed for diarrhea or loose stools. 01/24/24   Plotnikov, Aleksei V, MD  Metamucil Fiber 2 g CHEW Chew 2 tablets by mouth in the morning and at bedtime. 01/24/24   Plotnikov, Aleksei V, MD  mirtazapine  (REMERON ) 45 MG tablet Take 45 mg by mouth at bedtime.    [provider]  nystatin  (MYCOSTATIN /NYSTOP )  powder Use every day on rash Patient taking differently: Apply 1 Application topically at bedtime. Apply to under bilateral breast for moisture 01/24/24   Plotnikov, Aleksei V, MD  omega-3 acid ethyl esters (LOVAZA ) 1 g capsule Take 2 capsules (2 g total) by mouth 2 (two) times daily. 01/24/24   Plotnikov, [name], MD  omeprazole  (PRILOSEC) 20 MG capsule Take 20 mg by mouth daily. 01/06/24   [provider]  pantoprazole  (PROTONIX ) 40 MG tablet Take 40 mg by mouth 2 (two) times daily.    [provider]  rosuvastatin  (CRESTOR ) 10 MG tablet Take 1 tablet (10 mg total) by mouth daily. 08/30/23 04/27/24  Tobb, Kardie, DO  Saccharomyces boulardii (PROBIOTIC) 250 MG CAPS Take 250 mg by mouth 2 (two) times daily.    [provider]  sodium chloride  (OCEAN) 0.65 % SOLN nasal spray Place 1 spray into both nostrils daily. 01/24/24   Plotnikov, Aleksei V, MD  traMADol  (ULTRAM ) 50 MG tablet Take 1 tablet (50 mg total) by mouth every 6 (six) hours as needed for severe pain (pain score 7-10). 08/17/23   Plotnikov, [name], MD  famotidine  (PEPCID ) 40 MG tablet TAKE 1 TABLET BY MOUTH EVERY DAY IN THE EVENING Patient not taking: No sig reported 04/03/19 11/05/19  Kozlow, Camellia PARAS, MD    Allergies: Morphine  and codeine, Amlodipine , Auvelity  [dextromethorphan -bupropion  er], Buprenorphine, Cephalexin , Other, Sulfamethoxazole -trimethoprim , Sumatriptan , Codeine, Dust mite extract, Molds & smuts, and Tape    Review of Systems  Genitourinary:  Positive for dysuria.    Updated Vital Signs BP (!) 154/98 (BP Location: Left Arm)   Pulse 75   Temp 97.7 F (36.5 C) (Oral)   Resp 18   SpO2 97%   Physical Exam Vitals and nursing note reviewed.  Constitutional:      General: She is not in acute distress. HENT:     Head: Normocephalic and atraumatic.     Nose: Nose normal.     Mouth/Throat:     Mouth: Mucous membranes are moist.  Eyes:     General: No scleral icterus. Cardiovascular:     Rate  and Rhythm: Normal rate and regular rhythm.     Pulses: Normal pulses.     Heart sounds: Normal heart sounds.  Pulmonary:     Effort: Pulmonary effort is normal. No respiratory distress.     Breath sounds: No wheezing.  Abdominal:     Palpations: Abdomen is soft.     Tenderness: There is no abdominal tenderness. There is no guarding or rebound.  Musculoskeletal:     Cervical back: Normal range of motion.     Right lower leg: No edema.     Left lower leg: No edema.  Skin:    General: Skin is warm and dry.     Capillary Refill: Capillary refill  takes less than 2 seconds.  Neurological:     Mental Status: She is alert. Mental status is at baseline.  Psychiatric:        Mood and Affect: Mood normal.        Behavior: Behavior normal.     (all labs ordered are listed, but only abnormal results are displayed) Labs Reviewed  URINALYSIS, ROUTINE W REFLEX MICROSCOPIC - Abnormal; Notable for the following components:      Result Value   APPearance HAZY (*)    Specific Gravity, Urine 1.004 (*)    Leukocytes,Ua LARGE (*)    Bacteria, UA RARE (*)    All other components within normal limits  COMPREHENSIVE METABOLIC PANEL WITH GFR - Abnormal; Notable for the following components:   Glucose, Bld 113 (*)    Creatinine, Ser 1.07 (*)    GFR, Estimated 54 (*)    All other components within normal limits  URINE CULTURE  CBC    EKG: None  Radiology: CT Renal Stone Study Result Date: 04/27/2024 EXAM: CT ABDOMEN AND PELVIS WITHOUT CONTRAST 04/27/2024 11:04:34 PM TECHNIQUE: CT of the abdomen and pelvis was performed without the administration of intravenous contrast. Multiplanar reformatted images are provided for review. Automated exposure control, iterative reconstruction, and/or weight-based adjustment of the mA/kV was utilized to reduce the radiation dose to as low as reasonably achievable. COMPARISON: None available. CLINICAL HISTORY: Abdominal/flank pain, stone suspected; UTI - concern  for stone. FINDINGS: LOWER CHEST: Subpleural scarring in the right lower lobe. LIVER: The liver is unremarkable. GALLBLADDER AND BILE DUCTS: Status post cholecystectomy. No biliary ductal dilatation. SPLEEN: No acute abnormality. PANCREAS: No acute abnormality. ADRENAL GLANDS: No acute abnormality. KIDNEYS, URETERS AND BLADDER: 2 mm nonobstructing left lower renal calculus. No stones in the right kidney or ureters. No hydronephrosis. No perinephric or periureteral stranding. Urinary bladder is unremarkable. GI AND BOWEL: Stomach demonstrates no acute abnormality. There is no bowel obstruction. Normal appendix (image 51). PERITONEUM AND RETROPERITONEUM: No ascites. No free air. VASCULATURE: Atherosclerotic calcifications of the abdominal aorta. LYMPH NODES: No lymphadenopathy. REPRODUCTIVE ORGANS: Status post hysterectomy. BONES AND SOFT TISSUES: Right hip arthroplasty. No acute osseous abnormality. No focal soft tissue abnormality. IMPRESSION: 1. 2 mm nonobstructing left lower renal calculus. No hydronephrosis. Electronically signed by: Pinkie Pebbles MD 04/27/2024 11:08 PM EST RP Workstation: [id number]     Procedures   Medications Ordered in the ED  acetaminophen  (TYLENOL ) tablet 1,000 mg (has no administration in time range)  acetaminophen  (TYLENOL ) tablet 1,000 mg (has no administration in time range)    Or  acetaminophen  (TYLENOL ) suppository 650 mg (has no administration in time range)  polyethylene glycol (MIRALAX  / GLYCOLAX ) packet 17 g (has no administration in time range)  ondansetron  (ZOFRAN ) tablet 4 mg (has no administration in time range)    Or  ondansetron  (ZOFRAN ) injection 4 mg (has no administration in time range)  cefTRIAXone  (ROCEPHIN ) 1 g in sodium chloride  0.9 % 100 mL IVPB (0 g Intravenous Stopped 04/27/24 2322)  metoCLOPramide  (REGLAN ) injection 10 mg (10 mg Intravenous Given 04/27/24 2252)  diphenhydrAMINE  (BENADRYL ) capsule 25 mg (25 mg Oral Given 04/27/24 2251)                                     Medical Decision Making Amount and/or Complexity of Data Reviewed Labs: ordered. Radiology: ordered.  Risk OTC drugs. Prescription drug management.   This patient presents to the  ED for concern of abd pain, this involves a number of treatment options, and is a complaint that carries with it a moderate risk of complications and morbidity. A differential diagnosis was considered for the patient's symptoms which is discussed below:   The causes of generalized abdominal pain include but are not limited to AAA, mesenteric ischemia, appendicitis, diverticulitis, DKA, gastritis, gastroenteritis, AMI, nephrolithiasis, pancreatitis, peritonitis, adrenal insufficiency,lead poisoning, iron  toxicity, intestinal ischemia, constipation, UTI,SBO/LBO, splenic rupture, biliary disease, IBD, IBS, PUD, or hepatitis. Ectopic pregnancy, ovarian torsion, PID.    Co morbidities: Discussed in HPI   Brief History:  Patient is a 73 year old female with past medical history significant for bipolar, DVT, asthma, HLD, hypothyroidism, MDD  Patient presents emergency room today with complaints of urinary frequency urgency and dysuria. She is here from [facility] where she was being seen for mood issues.  No fever or nausea or vomiting. Some mild headache. No CP or SOB.      EMR reviewed including pt PMHx, past surgical history and past visits to ER.   See HPI for more details   Lab Tests:   I personally reviewed all laboratory work and imaging. Metabolic panel without any acute abnormality specifically kidney function within normal limits and no significant electrolyte abnormalities. CBC without leukocytosis or significant anemia.  UA concerning for UTI  Imaging Studies:  NAD. I personally reviewed all imaging studies and no acute abnormality found. I agree with radiology interpretation.    Cardiac Monitoring:  NA NA   Medicines ordered:  I ordered medication  including tylenol , rocephin , reglan , benadryl   for pain Reevaluation of the patient after these medicines showed that the patient improved I have reviewed the patients home medicines and have made adjustments as needed   Critical Interventions:     Consults/Attending Physician     Reevaluation:  After the interventions noted above I re-evaluated patient and found that they have :improved   Social Determinants of Health:      Problem List / ED Course:  UTI - treatment is rocephin . Macrobid  and defer to [facility] for admission/placement.    Dispostion:  After consideration of the diagnostic results and the patients response to treatment, I feel that the patent would benefit from antibiotics.    Final diagnoses:  Acute cystitis without hematuria    ED Discharge Orders          Ordered    nitrofurantoin , macrocrystal-monohydrate, (MACROBID ) 100 MG capsule  2 times daily        04/27/24 2325               Neldon Inoue [place], [place] 04/28/24 0007    Bari Roxie HERO, DO 04/30/24 [id number]

## 2024-04-27 NOTE — ED Triage Notes (Signed)
 Pt BIB safe transport from [facility] for UTI symptoms/painful urination. Endorses nausea and head ache, denies fever. Hx of bladder cancer.
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
## 2024-04-27 NOTE — ED Notes (Signed)
 Paitent provided dinner.

## 2024-04-27 NOTE — ED Notes (Signed)
 Patient stating she has a kidney infection, patient made aware that a UA has been collected and will result after which the providers can assess and treat her based upon the findings. Patient states she needs to go to the hospital. Provider made aware of patient statements. Medications administered with no complications.

## 2024-04-27 NOTE — Discharge Instructions (Addendum)
 Take antibiotics for the entire course even if your symptoms improved.  I given you a dose of antibiotics take for the entire course. Take your first dose of antibiotics tomorrow morning.

## 2024-04-27 NOTE — ED Notes (Signed)
 Patient provided dinner (PB&J, plain Lays chips, and Gatorade). Patient continues to report that she needs to go to the hospital. Writer and staff has explained several times that labwork is in progress and that they have an admission plan for tomorrow. Patient remains adamant that she needs to leave. Patient states she has a headache, neck pain, and is nauseous. Patient has already had PRN pain medication, refuses need for nausea medication at this time. Patient currently eating dinner with no complications. Environment secured, safety checks in place per facility policy.

## 2024-04-28 ENCOUNTER — Other Ambulatory Visit (HOSPITAL_COMMUNITY): Payer: Self-pay

## 2024-04-28 ENCOUNTER — Inpatient Hospital Stay
Admission: AD | Admit: 2024-04-28 | Discharge: 2024-05-15 | DRG: 885 | Disposition: A | Discharge status: Skilled Nursing Facility | Source: Intra-hospital | Attending: Psychiatry | Admitting: Psychiatry

## 2024-04-28 ENCOUNTER — Encounter: Payer: Self-pay | Admitting: Child & Adolescent Psychiatry

## 2024-04-28 DIAGNOSIS — E039 Hypothyroidism, unspecified: Secondary | ICD-10-CM | POA: Diagnosis present

## 2024-04-28 DIAGNOSIS — Z8542 Personal history of malignant neoplasm of other parts of uterus: Secondary | ICD-10-CM

## 2024-04-28 DIAGNOSIS — Z87820 Personal history of traumatic brain injury: Secondary | ICD-10-CM

## 2024-04-28 DIAGNOSIS — J4489 Other specified chronic obstructive pulmonary disease: Secondary | ICD-10-CM | POA: Diagnosis present

## 2024-04-28 DIAGNOSIS — Z8551 Personal history of malignant neoplasm of bladder: Secondary | ICD-10-CM

## 2024-04-28 DIAGNOSIS — E7800 Pure hypercholesterolemia, unspecified: Secondary | ICD-10-CM | POA: Diagnosis present

## 2024-04-28 DIAGNOSIS — Z635 Disruption of family by separation and divorce: Secondary | ICD-10-CM

## 2024-04-28 DIAGNOSIS — Z90710 Acquired absence of both cervix and uterus: Secondary | ICD-10-CM | POA: Diagnosis not present

## 2024-04-28 DIAGNOSIS — Z833 Family history of diabetes mellitus: Secondary | ICD-10-CM | POA: Diagnosis not present

## 2024-04-28 DIAGNOSIS — F319 Bipolar disorder, unspecified: Principal | ICD-10-CM | POA: Diagnosis present

## 2024-04-28 DIAGNOSIS — F332 Major depressive disorder, recurrent severe without psychotic features: Principal | ICD-10-CM | POA: Diagnosis present

## 2024-04-28 DIAGNOSIS — Z72820 Sleep deprivation: Secondary | ICD-10-CM

## 2024-04-28 DIAGNOSIS — Z8249 Family history of ischemic heart disease and other diseases of the circulatory system: Secondary | ICD-10-CM | POA: Diagnosis not present

## 2024-04-28 DIAGNOSIS — N183 Chronic kidney disease, stage 3 unspecified: Secondary | ICD-10-CM | POA: Diagnosis present

## 2024-04-28 DIAGNOSIS — Z8261 Family history of arthritis: Secondary | ICD-10-CM | POA: Diagnosis not present

## 2024-04-28 DIAGNOSIS — Z96641 Presence of right artificial hip joint: Secondary | ICD-10-CM | POA: Diagnosis present

## 2024-04-28 DIAGNOSIS — G8929 Other chronic pain: Secondary | ICD-10-CM | POA: Diagnosis present

## 2024-04-28 DIAGNOSIS — R45851 Suicidal ideations: Secondary | ICD-10-CM | POA: Diagnosis present

## 2024-04-28 DIAGNOSIS — Z7401 Bed confinement status: Secondary | ICD-10-CM

## 2024-04-28 DIAGNOSIS — K219 Gastro-esophageal reflux disease without esophagitis: Secondary | ICD-10-CM | POA: Diagnosis present

## 2024-04-28 DIAGNOSIS — M797 Fibromyalgia: Secondary | ICD-10-CM | POA: Diagnosis present

## 2024-04-28 DIAGNOSIS — F0390 Unspecified dementia without behavioral disturbance: Secondary | ICD-10-CM | POA: Diagnosis not present

## 2024-04-28 DIAGNOSIS — Z981 Arthrodesis status: Secondary | ICD-10-CM

## 2024-04-28 DIAGNOSIS — F419 Anxiety disorder, unspecified: Secondary | ICD-10-CM | POA: Diagnosis present

## 2024-04-28 DIAGNOSIS — N3000 Acute cystitis without hematuria: Secondary | ICD-10-CM | POA: Diagnosis not present

## 2024-04-28 DIAGNOSIS — L89159 Pressure ulcer of sacral region, unspecified stage: Secondary | ICD-10-CM | POA: Diagnosis present

## 2024-04-28 DIAGNOSIS — J4530 Mild persistent asthma, uncomplicated: Secondary | ICD-10-CM | POA: Diagnosis present

## 2024-04-28 DIAGNOSIS — Z9221 Personal history of antineoplastic chemotherapy: Secondary | ICD-10-CM | POA: Diagnosis not present

## 2024-04-28 DIAGNOSIS — I129 Hypertensive chronic kidney disease with stage 1 through stage 4 chronic kidney disease, or unspecified chronic kidney disease: Secondary | ICD-10-CM | POA: Diagnosis present

## 2024-04-28 DIAGNOSIS — Z808 Family history of malignant neoplasm of other organs or systems: Secondary | ICD-10-CM

## 2024-04-28 DIAGNOSIS — Z9841 Cataract extraction status, right eye: Secondary | ICD-10-CM

## 2024-04-28 DIAGNOSIS — Z9842 Cataract extraction status, left eye: Secondary | ICD-10-CM

## 2024-04-28 DIAGNOSIS — Z961 Presence of intraocular lens: Secondary | ICD-10-CM | POA: Diagnosis present

## 2024-04-28 LAB — SARS CORONAVIRUS 2 BY RT PCR: SARS Coronavirus 2 by RT PCR: NEGATIVE

## 2024-04-28 MED ORDER — BUSPIRONE HCL 10 MG PO TABS
30.0000 mg | ORAL_TABLET | Freq: Two times a day (BID) | ORAL | Status: DC
  Administered 2024-04-28 (×2): 30 mg via ORAL
  Filled 2024-04-28 (×2): qty 3

## 2024-04-28 MED ORDER — FERROUS SULFATE 325 (65 FE) MG PO TABS
325.0000 mg | ORAL_TABLET | Freq: Every day | ORAL | Status: DC
  Administered 2024-04-29 – 2024-05-15 (×17): 325 mg via ORAL
  Filled 2024-04-28 (×15): qty 1

## 2024-04-28 MED ORDER — MIRTAZAPINE 15 MG PO TABS
30.0000 mg | ORAL_TABLET | Freq: Every day | ORAL | Status: DC
  Administered 2024-04-28 – 2024-05-14 (×17): 30 mg via ORAL
  Filled 2024-04-28 (×16): qty 2

## 2024-04-28 MED ORDER — VITAMIN C 500 MG PO TABS
500.0000 mg | ORAL_TABLET | Freq: Every day | ORAL | Status: DC
  Administered 2024-04-29 – 2024-05-15 (×17): 500 mg via ORAL
  Filled 2024-04-28 (×15): qty 1

## 2024-04-28 MED ORDER — TRAMADOL HCL 50 MG PO TABS
50.0000 mg | ORAL_TABLET | Freq: Four times a day (QID) | ORAL | Status: DC | PRN
  Administered 2024-04-28 – 2024-05-14 (×22): 50 mg via ORAL
  Filled 2024-04-28 (×22): qty 1

## 2024-04-28 MED ORDER — OMEGA-3-ACID ETHYL ESTERS 1 G PO CAPS
2.0000 | ORAL_CAPSULE | Freq: Two times a day (BID) | ORAL | Status: DC
  Administered 2024-04-28 (×2): 2 g via ORAL
  Filled 2024-04-28 (×3): qty 2

## 2024-04-28 MED ORDER — NYSTATIN 100000 UNIT/GM EX POWD
1.0000 | Freq: Every day | CUTANEOUS | Status: DC
  Administered 2024-04-28: 1 via TOPICAL
  Filled 2024-04-28: qty 15

## 2024-04-28 MED ORDER — TRAMADOL HCL 50 MG PO TABS
50.0000 mg | ORAL_TABLET | Freq: Four times a day (QID) | ORAL | Status: DC | PRN
  Administered 2024-04-28: 50 mg via ORAL
  Filled 2024-04-28: qty 1

## 2024-04-28 MED ORDER — LORATADINE 10 MG PO TABS
10.0000 mg | ORAL_TABLET | Freq: Every day | ORAL | Status: DC
  Administered 2024-04-28: 10 mg via ORAL
  Filled 2024-04-28: qty 1

## 2024-04-28 MED ORDER — POLYETHYLENE GLYCOL 3350 17 G PO PACK: 17.0000 g | PACK | Freq: Every day | ORAL | Status: DC | PRN

## 2024-04-28 MED ORDER — FLUOXETINE HCL 20 MG PO CAPS
40.0000 mg | ORAL_CAPSULE | Freq: Every day | ORAL | Status: DC
  Administered 2024-04-28: 40 mg via ORAL
  Filled 2024-04-28: qty 2

## 2024-04-28 MED ORDER — FLUOXETINE HCL 20 MG PO CAPS
40.0000 mg | ORAL_CAPSULE | Freq: Every day | ORAL | Status: DC
  Administered 2024-04-29 – 2024-05-15 (×17): 40 mg via ORAL
  Filled 2024-04-28 (×15): qty 2

## 2024-04-28 MED ORDER — PSYLLIUM 95 % PO PACK
1.0000 | PACK | Freq: Two times a day (BID) | ORAL | Status: DC
  Administered 2024-04-28 – 2024-05-15 (×34): 1 via ORAL
  Filled 2024-04-28 (×32): qty 1

## 2024-04-28 MED ORDER — HYDRALAZINE HCL 10 MG PO TABS
10.0000 mg | ORAL_TABLET | Freq: Three times a day (TID) | ORAL | Status: DC
  Administered 2024-04-28 – 2024-05-15 (×46): 10 mg via ORAL
  Filled 2024-04-28 (×48): qty 1

## 2024-04-28 MED ORDER — ROSUVASTATIN CALCIUM 10 MG PO TABS
10.0000 mg | ORAL_TABLET | Freq: Every day | ORAL | Status: DC
  Administered 2024-04-29 – 2024-05-15 (×17): 10 mg via ORAL
  Filled 2024-04-28 (×15): qty 1

## 2024-04-28 MED ORDER — CARVEDILOL 12.5 MG PO TABS
12.5000 mg | ORAL_TABLET | Freq: Two times a day (BID) | ORAL | Status: DC
  Administered 2024-04-28: 12.5 mg via ORAL
  Filled 2024-04-28: qty 1

## 2024-04-28 MED ORDER — MIRTAZAPINE 7.5 MG PO TABS
45.0000 mg | ORAL_TABLET | Freq: Every day | ORAL | Status: DC
  Filled 2024-04-28: qty 2

## 2024-04-28 MED ORDER — COLESEVELAM HCL 625 MG PO TABS
1250.0000 mg | ORAL_TABLET | Freq: Two times a day (BID) | ORAL | Status: DC
  Administered 2024-04-28: 1250 mg via ORAL
  Filled 2024-04-28 (×2): qty 2

## 2024-04-28 MED ORDER — LOPERAMIDE HCL 2 MG PO CAPS: 2.0000 mg | ORAL_CAPSULE | Freq: Four times a day (QID) | ORAL | Status: DC | PRN

## 2024-04-28 MED ORDER — OLANZAPINE 5 MG PO TBDP: 5.0000 mg | ORAL_TABLET | Freq: Three times a day (TID) | ORAL | Status: DC | PRN

## 2024-04-28 MED ORDER — VITAMIN B-12 1000 MCG PO TABS
1000.0000 ug | ORAL_TABLET | Freq: Every day | ORAL | Status: DC
  Administered 2024-04-28: 1000 ug via ORAL
  Filled 2024-04-28: qty 1

## 2024-04-28 MED ORDER — D-MANNOSE 500 MG PO CAPS: 500.0000 mg | ORAL_CAPSULE | Freq: Two times a day (BID) | ORAL | Status: DC

## 2024-04-28 MED ORDER — ALPRAZOLAM 0.5 MG PO TABS
1.0000 mg | ORAL_TABLET | Freq: Three times a day (TID) | ORAL | Status: DC
  Administered 2024-04-28 – 2024-05-15 (×49): 1 mg via ORAL
  Filled 2024-04-28 (×46): qty 2

## 2024-04-28 MED ORDER — NYSTATIN 100000 UNIT/GM EX POWD
1.0000 | Freq: Every day | CUTANEOUS | Status: DC
  Administered 2024-04-28 – 2024-05-14 (×16): 1 via TOPICAL
  Filled 2024-04-28: qty 15

## 2024-04-28 MED ORDER — OLANZAPINE 10 MG IM SOLR: 5.0000 mg | Freq: Three times a day (TID) | INTRAMUSCULAR | Status: DC | PRN

## 2024-04-28 MED ORDER — SACCHAROMYCES BOULARDII 250 MG PO CAPS
250.0000 mg | ORAL_CAPSULE | Freq: Two times a day (BID) | ORAL | Status: DC
  Administered 2024-04-28 – 2024-05-15 (×34): 250 mg via ORAL
  Filled 2024-04-28 (×32): qty 1

## 2024-04-28 MED ORDER — LEVOTHYROXINE SODIUM 100 MCG PO TABS
100.0000 ug | ORAL_TABLET | Freq: Every day | ORAL | Status: DC
  Administered 2024-04-28: 100 ug via ORAL
  Filled 2024-04-28: qty 1

## 2024-04-28 MED ORDER — SALINE SPRAY 0.65 % NA SOLN
1.0000 | Freq: Every day | NASAL | Status: DC
  Administered 2024-04-28: 1 via NASAL
  Filled 2024-04-28: qty 44

## 2024-04-28 MED ORDER — HYDRALAZINE HCL 10 MG PO TABS
10.0000 mg | ORAL_TABLET | Freq: Three times a day (TID) | ORAL | Status: DC
  Administered 2024-04-28: 10 mg via ORAL
  Filled 2024-04-28 (×3): qty 1

## 2024-04-28 MED ORDER — PSYLLIUM 95 % PO PACK
1.0000 | PACK | Freq: Two times a day (BID) | ORAL | Status: DC
  Administered 2024-04-28 (×2): 1 via ORAL
  Filled 2024-04-28 (×3): qty 1

## 2024-04-28 MED ORDER — ROSUVASTATIN CALCIUM 20 MG PO TABS
10.0000 mg | ORAL_TABLET | Freq: Every day | ORAL | Status: DC
  Administered 2024-04-28: 10 mg via ORAL
  Filled 2024-04-28: qty 1

## 2024-04-28 MED ORDER — LEVOTHYROXINE SODIUM 100 MCG PO TABS
100.0000 ug | ORAL_TABLET | Freq: Every day | ORAL | Status: DC
  Administered 2024-04-29 – 2024-05-15 (×17): 100 ug via ORAL
  Filled 2024-04-28 (×16): qty 1

## 2024-04-28 MED ORDER — NITROFURANTOIN MONOHYD MACRO 100 MG PO CAPS
100.0000 mg | ORAL_CAPSULE | Freq: Two times a day (BID) | ORAL | Status: DC
  Administered 2024-04-28 – 2024-05-01 (×6): 100 mg via ORAL
  Filled 2024-04-28 (×6): qty 1

## 2024-04-28 MED ORDER — VITAMIN B-12 1000 MCG PO TABS
1000.0000 ug | ORAL_TABLET | Freq: Every day | ORAL | Status: DC
  Administered 2024-04-29 – 2024-05-15 (×17): 1000 ug via ORAL
  Filled 2024-04-28 (×15): qty 1

## 2024-04-28 MED ORDER — PANTOPRAZOLE SODIUM 40 MG PO TBEC
40.0000 mg | DELAYED_RELEASE_TABLET | Freq: Two times a day (BID) | ORAL | Status: DC
  Administered 2024-04-28 – 2024-05-15 (×34): 40 mg via ORAL
  Filled 2024-04-28 (×31): qty 1

## 2024-04-28 MED ORDER — CLONIDINE HCL 0.1 MG PO TABS
0.2000 mg | ORAL_TABLET | Freq: Every day | ORAL | Status: DC
  Administered 2024-04-28 – 2024-05-14 (×16): 0.2 mg via ORAL
  Filled 2024-04-28 (×15): qty 2

## 2024-04-28 MED ORDER — CARVEDILOL 6.25 MG PO TABS
12.5000 mg | ORAL_TABLET | Freq: Two times a day (BID) | ORAL | Status: DC
  Administered 2024-04-28 – 2024-05-14 (×32): 12.5 mg via ORAL
  Filled 2024-04-28 (×31): qty 2

## 2024-04-28 MED ORDER — VITAMIN C 500 MG PO TABS
500.0000 mg | ORAL_TABLET | Freq: Every day | ORAL | Status: DC
  Administered 2024-04-28: 500 mg via ORAL
  Filled 2024-04-28: qty 1

## 2024-04-28 MED ORDER — COLESEVELAM HCL 625 MG PO TABS
1250.0000 mg | ORAL_TABLET | Freq: Two times a day (BID) | ORAL | Status: DC
  Administered 2024-04-28 – 2024-05-15 (×34): 1250 mg via ORAL
  Filled 2024-04-28 (×33): qty 2

## 2024-04-28 MED ORDER — CLONIDINE HCL 0.1 MG PO TABS
0.2000 mg | ORAL_TABLET | Freq: Every day | ORAL | Status: DC
  Administered 2024-04-28: 0.2 mg via ORAL
  Filled 2024-04-28: qty 2

## 2024-04-28 MED ORDER — HYDROXYZINE HCL 10 MG PO TABS
10.0000 mg | ORAL_TABLET | Freq: Once | ORAL | Status: AC
  Administered 2024-04-28: 10 mg via ORAL
  Filled 2024-04-28: qty 1

## 2024-04-28 MED ORDER — PANTOPRAZOLE SODIUM 40 MG PO TBEC
40.0000 mg | DELAYED_RELEASE_TABLET | Freq: Two times a day (BID) | ORAL | Status: DC
  Administered 2024-04-28 (×2): 40 mg via ORAL
  Filled 2024-04-28 (×2): qty 1

## 2024-04-28 MED ORDER — LORATADINE 10 MG PO TABS
10.0000 mg | ORAL_TABLET | Freq: Every day | ORAL | Status: DC
  Administered 2024-04-29 – 2024-05-15 (×17): 10 mg via ORAL
  Filled 2024-04-28 (×15): qty 1

## 2024-04-28 MED ORDER — MIRTAZAPINE 30 MG PO TABS
30.0000 mg | ORAL_TABLET | Freq: Every day | ORAL | Status: DC
  Administered 2024-04-28: 30 mg via ORAL

## 2024-04-28 MED ORDER — FERROUS SULFATE 325 (65 FE) MG PO TABS
325.0000 mg | ORAL_TABLET | Freq: Every day | ORAL | Status: DC
  Administered 2024-04-28: 325 mg via ORAL
  Filled 2024-04-28: qty 1

## 2024-04-28 MED ORDER — NITROFURANTOIN MONOHYD MACRO 100 MG PO CAPS
100.0000 mg | ORAL_CAPSULE | Freq: Two times a day (BID) | ORAL | Status: DC
  Administered 2024-04-28: 100 mg via ORAL
  Filled 2024-04-28: qty 1

## 2024-04-28 MED ORDER — VITAMIN D 25 MCG (1000 UNIT) PO TABS
5000.0000 [IU] | ORAL_TABLET | Freq: Every day | ORAL | Status: DC
  Administered 2024-04-28: 5000 [IU] via ORAL
  Filled 2024-04-28: qty 5

## 2024-04-28 MED ORDER — OMEGA-3-ACID ETHYL ESTERS 1 G PO CAPS
2.0000 | ORAL_CAPSULE | Freq: Two times a day (BID) | ORAL | Status: DC
  Administered 2024-04-28 – 2024-05-15 (×34): 2 g via ORAL
  Filled 2024-04-28 (×32): qty 2

## 2024-04-28 MED ORDER — ALPRAZOLAM 0.5 MG PO TABS
1.0000 mg | ORAL_TABLET | Freq: Three times a day (TID) | ORAL | Status: DC
  Administered 2024-04-28 (×3): 1 mg via ORAL
  Filled 2024-04-28 (×3): qty 2

## 2024-04-28 MED ORDER — BUSPIRONE HCL 5 MG PO TABS
30.0000 mg | ORAL_TABLET | Freq: Two times a day (BID) | ORAL | Status: DC
  Administered 2024-04-28 – 2024-05-06 (×16): 30 mg via ORAL
  Filled 2024-04-28 (×16): qty 6

## 2024-04-28 MED ORDER — SACCHAROMYCES BOULARDII 250 MG PO CAPS
250.0000 mg | ORAL_CAPSULE | Freq: Two times a day (BID) | ORAL | Status: DC
  Administered 2024-04-28 (×2): 250 mg via ORAL
  Filled 2024-04-28 (×3): qty 1

## 2024-04-28 NOTE — Progress Notes (Signed)
 PHARMACIST - PHYSICIAN ORDER COMMUNICATION  CONCERNING: P&T Medication Policy on Herbal Medications  DESCRIPTION:  This patients order for:  D-Mannose  has been noted.  This product(s) is classified as an herbal or natural product. Due to a lack of definitive safety studies or FDA approval, nonstandard manufacturing practices, plus the potential risk of unknown drug-drug interactions while on inpatient medications, the Pharmacy and Therapeutics Committee does not permit the use of herbal or natural products of this type within [facility].   ACTION TAKEN: The pharmacy department is unable to verify this order at this time and your patient has been informed of this safety policy. Please reevaluate patients clinical condition at discharge and address if the herbal or natural product(s) should be resumed at that time.

## 2024-04-28 NOTE — Group Note (Signed)
 Date:  04/28/2024 Time:  8:50 PM  Group Topic/Focus:  Wrap-Up Group:   The focus of this group is to help patients review their daily goal of treatment and discuss progress on daily workbooks.    Participation Level:  None  Additional Comments:  Pt was eating  [name] T Zulema 04/28/2024, 8:50 PM

## 2024-04-28 NOTE — ED Notes (Signed)
 Patient off unit to facility per provider . Patient alert, calm, cooperative, no s/s of distress at time of discharge. Discharge information and belongings given to Safe Transport for facility. Patient ambulatory with a walker. Patient off unit with w/c, escorted by RN and NT. Patient transported by [employer].

## 2024-04-28 NOTE — Tx Team (Signed)
 Initial Treatment Plan 04/28/2024 6:00 PM KARALINA TIFT [id number]    PATIENT STRESSORS: Health problems     PATIENT STRENGTHS: Ability for insight  Average or above average intelligence  Communication skills  General fund of knowledge  Religious Affiliation  Supportive family/friends    PATIENT IDENTIFIED PROBLEMS:   anxiety  depression                 DISCHARGE CRITERIA:  Ability to meet basic life and health needs Adequate post-discharge living arrangements Improved stabilization in mood, thinking, and/or behavior Safe-care adequate arrangements made  PRELIMINARY DISCHARGE PLAN: Attend aftercare/continuing care group Return to previous living arrangement  PATIENT/FAMILY INVOLVEMENT: This treatment plan has been presented to and reviewed with the patient, RAINNA NEARHOOD. The patient has been given the opportunity to ask questions and make suggestions.   Garen CINDERELLA Daring, RN 04/28/2024, 6:00 PM

## 2024-04-28 NOTE — Progress Notes (Signed)
 Patient is a 73 year old female admitted voluntarily to the [facility] floor from [facility] by way of [facility] with complaints of worsening depression, anxiety, hopelessness, and reports of an SI statement to overdose on sleeping pills.  Patient presents to assessment via transport chair but is ambulatory with a walker. She is A+O x 4 but states she has mild cognitive dementia as determined by [facility]. She currently denies SI/HI/AVH. I don't really want to take medication to die. I'm not scared to die, but I am not in a hurry to die either. She does agree to contract for safety on the unit. Patient's affect is appropriate and speech is rapid but logical and coherent. Patient endorses depression and anxiety 10/10. She states that her main stressors are not being able to control the high level of depression and high anxiety. Abbreviated medical history includes: hypothyroidism, asthma, hypertension, hx of uterine, bladder, and lung cancer. She currently denies pain. Patient uses a four wheel walker at home and is given a front wheel walker for use on the unit. Reports the use of reading glasses and is on her person. Reports last BM on April 27, 2024. Patient denies smoking cigs, drinking alcohol , or substance use. Patient says that her support system is her son [name]. Her goal while she is here is to get on different meds.   Skin assessment and body search completed with Harlene, MHT. Skin: warm/dry. Right hip scar from recent hip replacement, right inner wrist bruise from IV site removal, and dry, flaky bil feet. No contrabands found.  Emotional support and reassurance provided throughout admission intake. Consents signed. Afterwards, oriented patient to unit, room and call light, reviewed POC with all questions answered and concerns voiced. Patient verbalized understanding. Denies any needs at this time.  Will continue to monitor with ongoing Q 15 minute safety checks.

## 2024-04-28 NOTE — ED Provider Notes (Addendum)
 Emergency Medicine Observation Re-evaluation Note  [name] is a 73 y.o. female, seen on rounds today.  Pt initially presented to the ED for complaints of Dysuria Currently, the patient is taking her morning medication and watching TV. She has no acute complaints.  Physical Exam  BP (!) 150/74 (BP Location: Right Arm)   Pulse 63   Temp 99.5 F (37.5 C) (Oral)   Resp 12   SpO2 96%  Physical Exam General: Sitting in bed, calm Cardiac: well-perfused Lungs: even, unlabored breathing Psych: calm  ED Course / MDM  EKG:   I have reviewed the labs performed to date as well as medications administered while in observation.  Recent changes in the last 24 hours include brought to ED while she was at [facility] for mood issues, diagnosed w/ UTI here. Ceftriaxone  given overnight but no PO abx were ordered. Reports N/V to keflex  in allergies. Macrobid  should be appropriate given Cr 1.07 and CrCl 57.   Plan  Current plan is for macrobid  and disposition by psych.    Franklyn Sid SAILOR, MD 04/28/24 9240    Franklyn Sid SAILOR, MD 04/28/24 [phone]

## 2024-04-28 NOTE — Plan of Care (Signed)
°  Problem: Education: Goal: Emotional status will improve Outcome: Not Progressing Goal: Mental status will improve Outcome: Not Progressing   Problem: Education: Goal: Ability to state activities that reduce stress will improve Outcome: Not Progressing   Problem: Education: Goal: Utilization of techniques to improve thought processes will improve Outcome: Not Progressing

## 2024-04-29 ENCOUNTER — Other Ambulatory Visit (HOSPITAL_COMMUNITY): Payer: Self-pay

## 2024-04-29 DIAGNOSIS — F332 Major depressive disorder, recurrent severe without psychotic features: Secondary | ICD-10-CM | POA: Diagnosis not present

## 2024-04-29 MED ORDER — HYDROXYZINE HCL 50 MG PO TABS
50.0000 mg | ORAL_TABLET | Freq: Four times a day (QID) | ORAL | Status: DC | PRN
  Administered 2024-04-29 – 2024-05-14 (×8): 50 mg via ORAL
  Filled 2024-04-29 (×7): qty 1

## 2024-04-29 MED ORDER — ACETAMINOPHEN 325 MG PO TABS
650.0000 mg | ORAL_TABLET | Freq: Four times a day (QID) | ORAL | Status: DC | PRN
  Administered 2024-04-29 – 2024-05-15 (×12): 650 mg via ORAL
  Filled 2024-04-29 (×12): qty 2

## 2024-04-29 NOTE — Group Note (Signed)
 LCSW Group Therapy Note  Group Date: 04/29/2024 Start Time: 1300 End Time: 1345   Type of Therapy and Topic:  Group Therapy - Healthy vs Unhealthy Coping Skills  Participation Level:  Did Not Attend   Description of Group The focus of this group was to determine what unhealthy coping techniques typically are used by group members and what healthy coping techniques would be helpful in coping with various problems. Patients were guided in becoming aware of the differences between healthy and unhealthy coping techniques. Patients were asked to identify 2-3 healthy coping skills they would like to learn to use more effectively.  Therapeutic Goals Patients learned that coping is what human beings do all day long to deal with various situations in their lives Patients defined and discussed healthy vs unhealthy coping techniques Patients identified their preferred coping techniques and identified whether these were healthy or unhealthy Patients determined 2-3 healthy coping skills they would like to become more familiar with and use more often. Patients provided support and ideas to each other   Summary of Patient Progress:  Did not attend group   Therapeutic Modalities Cognitive Behavioral Therapy Motivational Interviewing  [name] CHRISTELLA Niece, [place] 04/29/2024  3:47 PM

## 2024-04-29 NOTE — [facility] Group Notes (Signed)
 [facility] Group Notes:  (Nursing/MHT/Case Management/Adjunct)  Date:  04/29/2024  Time:  4:02 PM  Type of Therapy:  Nurse Education  Participation Level:  did not attend  Modes of Intervention:  Support  Summary of Progress/Problems: Patient encouraged to attend group.   Arvin Abello 04/29/2024, 4:02 PM

## 2024-04-29 NOTE — H&P (Addendum)
 Chief Complaint (CC):  I feel hopeless, worthless, and I want to end my life by overdosing on pills. I dont feel like I can go on anymore.  History of Present Illness (HPI):  [name] is a 73 year old female with a history of bipolar disorder and chronic medical conditions who presents with worsening depressive symptoms and suicidal ideation. The patient reports a long history of multiple medical surgeries and hospitalizations over the past 12 years, which have significantly impacted her physical and emotional well-being. Her depressive symptoms have gradually worsened over the past month, primarily following a change in her roommate at [facility].  She describes her new roommate as dependent on others for care, incontinent, and suffering from a sacral decubitus ulcer, which causes significant malodor and disruption. The patient reports being disturbed by the noise associated with her roommate's care and finds herself frequently being called upon for assistance. She believes this situation has contributed to her current feelings of hopelessness, worthlessness, and anhedonia.  The patient is very circumstantial in her communication but continues to express suicidal ideations. She shares that, if discharged, she plans to crawl into my closet, take a bunch of pills, and never wake up. She states that she does not currently have access to enough pills due to the nursing staff administering her medications, but has a clear plan to overdose.  The patient has chronic medical issues, including hypertension, bladder cancer (in complete remission), hypercholesterolemia, hypothyroidism, and GERD, which have also led to a sense of physical exhaustion and emotional depletion. She feels these issues, combined with the numerous surgeries and hospital stays, have contributed to her feelings of being unable to do anything in life anymore.  The patients psychiatric  history includes being diagnosed with bipolar disorder. She follows up with Dr. Gutterman, a psychiatrist she has a longstanding relationship with, who was involved in prompting this admission after the patient shared her suicidal ideation and plan. The patients psychiatric medications include Prozac  40 mg and Remeron  45 mg, though she feels these medications have lost effectiveness over time. She has a history of being treated with Abilify and Latuda , both of which she reports were ineffective in the past.  She endorses a history of multiple hospitalizations for psychiatric reasons, including a prior mental health-related hospitalization 9 years ago at [facility] under the care of Dr. Rudy, where she was started on Prozac  and Remeron .  The patient describes a long history of anxiety and restlessness. 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 She describes strained relationships with her family, particularly her oldest son, who is a substance abuser and whom she has not seen in three years.  Past Psychiatric History:  Diagnoses: Bipolar disorder, depressive episodes  Hospitalizations:  Multiple inpatient psychiatric admissions over the last 12 years for medical and psychiatric issues.  Prior mental health-related hospitalization at [facility] 9 years ago, under the care of Dr. Rudy, for severe depression and suicidal ideation.  Medications:  Prozac  40 mg daily (currently prescribed)  Remeron  45 mg daily (recently adjusted by NP)  Previous trials of Abilify and Latuda , which were ineffective  Psychotherapy: Ongoing therapy with Dr. Gutterman, PhD.  Past Medical History:  Hypertension  Bladder cancer (in complete  remission)  Hypercholesterolemia  Hypothyroidism  GERD Blood pressure  124/70, pulse 67, temperature 97.8 F (36.6 C), resp. rate 19, height 5' 2 (1.575 m), weight 76 kg, SpO2 94%. General Appearance: Elderly female, alert and cooperative, in no acute distress. Appears stated age. Ambulates with rollator.  HEENT: Head normocephalic and atraumatic. Pupils equal, round, and reactive to light. Extraocular movements intact. No scleral icterus. Oral mucosa moist. No oropharyngeal lesions.  Neck: Supple, full range of motion. No lymphadenopathy. No thyromegaly.  Cardiovascular: Regular rate and rhythm. Normal S1 and S2. No murmurs, rubs, or gallops. Peripheral pulses palpable and symmetric. No lower extremity edema.  Respiratory: Lungs clear to auscultation bilaterally. No wheezes, rales, or rhonchi. No increased work of breathing.  Medications:  Prozac  40 mg daily  Remeron  45 mg daily  Antihypertensive medications (specifics unknown)  Levothyroxine  for hypothyroidism  Statins for hypercholesterolemia  Antacids/Proton pump inhibitors for GERD  Social History:  Living Situation: Resident at [facility], an [facility] living facility  Family History:  Grandfather: History of nervous breakdown, admitted to [facility].  Oldest son: Substance abuse disorder, estranged for 3 years.  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  Support System: Has a good relationship with her psychiatrist, Dr. Gutterman; her son, [name], is her Power of Attorney ([name]).  Review of Systems (ROS):    Physical exam  General: No significant weight changes or fever. Reports fatigue and decreased energy.  Skin: No new rashes or lesions. Complains of hygiene being impacted by her living environment.  HEENT: No significant visual or auditory disturbances. No  headaches or vision changes.  Cardiovascular: History of hypertension, well controlled. Denies chest pain or palpitations.  Respiratory: No shortness of breath or coughing.  Gastrointestinal: No significant changes in bowel habits. History of GERD, managed with medication.  Neurological: No recent falls, no focal neurological deficits.  Musculoskeletal: History of multiple surgeries, including a recent right hip replacement.  Psychiatric: Persistent depressive symptoms, including anhedonia, low energy, poor concentration, and suicidal ideation.  Mental Status Examination (MSE):  Appearance: Clean and appropriately dressed. Eye contact is good.  Behavior: Cooperative but circumstantial, hyperverbal, and restless.  Speech: Clear and coherent, but pressured at times with flight of ideas.  Mood: Depressed and anxious.  Affect: Congruent with mood, somewhat flat.  Thought Process: Organized, but circumstantial and tangential. Flight of ideas observed.  Thought Content: No delusions, hallucinations, or paranoia. Endorses suicidal ideation with a clear plan to overdose on pills.  Cognition:  Alert and oriented to person, place, and time.  Attention and concentration are impaired due to her anxiety and rumination.  Memory appears intact.  Insight: Limited. She acknowledges the severity of her depression but attributes much of it to environmental factors and medication failure.  Judgment: Impaired due to current suicidal ideation.  Assessment and Plan:  Assessment:  Primary Diagnosis: Major Depressive Disorder (MDD) with Suicidal Ideation, Bipolar Disorder (history)  Secondary Diagnosis: Chronic medical conditions (hypertension, hypercholesterolemia, hypothyroidism, GERD, bladder cancer in remission)  Plan:  Safety: Immediate inpatient hospitalization for safety due to ongoing suicidal ideation with a clear plan. Ensure close monitoring and medication administration by  staff.  Psychiatric Medications:  Reevaluate the current regimen of Prozac  40 mg and Remeron  30mg . Patient is on Ativan  1 mg 3 times daily recommend titrating this down slowly if possible Consider adjusting doses or switching medications (e.g., trial of a mood stabilizer or different antidepressant).  Psychotherapy: Continue psychotherapy with Dr. Gutterman;  facilitate communication with the outpatient provider for medication adjustments and collaborative care.  Environmental Modifications: Address patients current living situation. Consider reassignment to a new room to alleviate stressors from the current roommate.  Family Involvement: Engage [name], patients son and POA, to ensure support and involvement in decision-making.  Medical Issues: Continue monitoring and managing chronic medical conditions. Ensure appropriate follow-up care for hypertension, hypothyroidism, and GERD.  Discharge Planning: Assess readiness for discharge once mood stabilizes, and further psychiatric management is optimized.

## 2024-04-29 NOTE — Group Note (Signed)
 Date:  04/29/2024 Time:  10:20 AM  Group Topic/Focus:  Movement Therapy, Morning Stretch with Billyjoe Go.    Participation Level:  Did Not Attend    [name] Bias 04/29/2024, 10:20 AM

## 2024-04-29 NOTE — BHH Suicide Risk Assessment (Signed)
 [facility] Admission Suicide Risk Assessment   Nursing information obtained from:  Patient Demographic factors:  Age 73 or older, Divorced or widowed, Caucasian Current Mental Status:  NA Loss Factors:  Decline in physical health Historical Factors:  NA Risk Reduction Factors:  Positive social support, Religious beliefs about death, Sense of responsibility to family  Total Time spent with patient: 30 minutes Principal Problem: MDD (major depressive disorder), recurrent episode, severe (HCC) Diagnosis:  Principal Problem:   MDD (major depressive disorder), recurrent episode, severe (HCC)  Subjective Data: [name] is a 73 year old female with a history of bipolar disorder and chronic medical conditions who presents with worsening depressive symptoms and suicidal ideation. The patient reports a long history of multiple medical surgeries and hospitalizations over the past 12 years, which have significantly impacted her physical and emotional well-being. Her depressive symptoms have gradually worsened over the past month, primarily following a change in her roommate at [facility].  She describes her new roommate as dependent on others for care, incontinent, and suffering from a sacral decubitus ulcer, which causes significant malodor and disruption. The patient reports being disturbed by the noise associated with her roommate's care and finds herself frequently being called upon for assistance. She believes this situation has contributed to her current feelings of hopelessness, worthlessness, and anhedonia.  Continued Clinical Symptoms:  Alcohol  Use Disorder Identification Test Final Score (AUDIT): 0 The Alcohol  Use Disorders Identification Test, Guidelines for Use in Primary Care, Second Edition.  World Science Writer [facility]). Score between 0-7:  no or low risk or alcohol  related problems. Score between 8-15:  moderate risk of alcohol  related  problems. Score between 16-19:  high risk of alcohol  related problems. Score 20 or above:  warrants further diagnostic evaluation for alcohol  dependence and treatment.   CLINICAL FACTORS:   Depression:   Hopelessness   Musculoskeletal: Strength & Muscle Tone: within normal limits Gait & Station: normal Patient leans: N/A  Psychiatric Specialty Exam:  Presentation  General Appearance:  Well Groomed  Eye Contact: Fair  Speech: Clear and Coherent  Speech Volume: Normal  Handedness: Right   Mood and Affect  Mood: Anxious; Depressed  Affect: Congruent   Thought Process  Thought Processes: Coherent  Descriptions of Associations:Intact  Orientation:Full (Time, Place and Person)  Thought Content:Logical  History of Schizophrenia/Schizoaffective disorder:No  Duration of Psychotic Symptoms:No data recorded Hallucinations:No data recorded Ideas of Reference:None  Suicidal Thoughts:No data recorded Homicidal Thoughts:No data recorded  Sensorium  Memory: Immediate Fair  Judgment: Fair  Insight: Fair   Art Therapist  Concentration: Good  Attention Span: Good  Recall: Good  Fund of Knowledge: Good  Language: Good   Psychomotor Activity  Psychomotor Activity:No data recorded  Assets  Assets: Desire for Improvement; Resilience   Sleep  Sleep:No data recorded   Physical Exam: Physical Exam ROS Blood pressure 124/70, pulse 67, temperature 97.8 F (36.6 C), resp. rate 19, height 5' 2 (1.575 m), weight 76 kg, SpO2 94%. Body mass index is 30.64 kg/m.   COGNITIVE FEATURES THAT CONTRIBUTE TO RISK:  Thought constriction (tunnel vision)    SUICIDE RISK:   Moderate:  Frequent suicidal ideation with limited intensity, and duration, some specificity in terms of plans, no associated intent, good self-control, limited dysphoria/symptomatology, some risk factors present, and identifiable protective factors, including available and  accessible social support.  PLAN OF CARE: Safety: Immediate inpatient hospitalization for safety due to ongoing suicidal ideation with a clear plan. Ensure close monitoring and medication  administration by staff.  Psychiatric Medications:  Reevaluate the current regimen of Prozac  40 mg and Remeron  45 mg.  Consider adjusting doses or switching medications (e.g., trial of a mood stabilizer or different antidepressant).  Psychotherapy: Continue psychotherapy with Dr. Gutterman; facilitate communication with the outpatient provider for medication adjustments and collaborative care.  Environmental Modifications: Address patients current living situation. Consider reassignment to a new room to alleviate stressors from the current roommate.  Family Involvement: Engage [name], patients son and POA, to ensure support and involvement in decision-making.  Medical Issues: Continue monitoring and managing chronic medical conditions. Ensure appropriate follow-up care for hypertension, hypothyroidism, and GERD.  Discharge Planning: Assess readiness for discharge once mood stabilizes, and further psychiatric management is optimized.   This note should provide a comprehensive history and physical for your patient. Let me know if you'd like  I certify that inpatient services furnished can reasonably be expected to improve the patient's condition.   Charlotte JONELLE Manners, MD 04/29/2024, 11:49 AM

## 2024-04-29 NOTE — Progress Notes (Signed)
 D- Patient alert and oriented x4. Pt presents with a pleasant mood and affect. pt denies SI, HI, AVH, but endorses continued anxiety 8/10 and pain. Pt states goal is to find a new place to live and not have to go back to current nursing home due to having to help care for roommates. Pt states anxiety is high due to thinking about going back to nursing home.   A- Scheduled and PRN medications administered to patient, per MD orders. New Atarax  PRN medication ordered per provider recommendation due to continued anxiety. Support and encouragement provided.  Routine safety checks conducted every 15 minutes.  Patient informed to notify staff with problems or concerns.  R- No adverse drug reactions noted. Patient contracts for safety at this time. Patient compliant with medications and treatment plan. Patient receptive, calm, and cooperative. Patient interacts well with others on the unit.  Patient remains safe at this time.    Talasia Saulter S.,RN

## 2024-04-29 NOTE — Group Note (Signed)
 Date:  04/29/2024 Time:  9:55 PM  Group Topic/Focus:  Wrap-Up Group:   The focus of this group is to help patients review their daily goal of treatment and discuss progress on daily workbooks.    Participation Level:  Active  Participation Quality:  Appropriate  Affect:  Appropriate  Cognitive:  Appropriate  Insight: Appropriate  Engagement in Group:  Engaged  Modes of Intervention:  Discussion  Additional Comments:    [name] CHRISTELLA Bunker 04/29/2024, 9:55 PM

## 2024-04-29 NOTE — BHH Counselor (Addendum)
 Adult Comprehensive Assessment  Patient ID: CAMIE HAUSS, female   DOB: 09/08/1950, 72 y.o.   MRN: [id number]  Information Source: Information source: Patient  Current Stressors:  Patient states their primary concerns and needs for treatment are:: I was living at home alone but one day I felt loopy and I brought myself to the hospital. Patient states their goals for this hospitilization and ongoing recovery are:: Being able to be in a place where I can get better Educational / Learning stressors: none reported Employment / Job issues: none reported Family Relationships: none reported Housing / Lack of housing: none reported  Living/Environment/Situation:  Living conditions (as described by patient or guardian): Patient reports feeling anxious in her current nursing home Who else lives in the home?: other residents How long has patient lived in current situation?: Since Aug 2025 What is atmosphere in current home: Other (Comment) (Pt current at [employer] & does not like it there)  Family History:  What types of issues is patient dealing with in the relationship?: got cancer went through chemo. Husband changed his mind and quit the relationship.  Additional relationship information: NA Does patient have children?: Yes How many children?: 2 How is patient's relationship with their children?: Very good. Both sons live in [place] is a engineer, civil (consulting) and supportive, the other is an addict and she has no contact with them  Childhood History:  By whom was/is the patient raised?: Both parents Description of patient's relationship with caregiver when they were a child: Good relationship Does patient have siblings?: Yes Number of Siblings: 2 Description of patient's current relationship with siblings: 1 sister and 1 brother. Very good. They live out of town but they are supportive.  Did patient suffer from severe childhood neglect?: No Has patient ever been sexually  abused/assaulted/raped as an adolescent or adult?: No Was the patient ever a victim of a crime or a disaster?: No Witnessed domestic violence?: No Has patient been affected by domestic violence as an adult?: No  Education:  Highest grade of school patient has completed: Mudlogger  Degree  Employment/Work Situation:   Employment Situation: (P) On disability Why is Patient on Disability: (P) disabled How Long has Patient Been on Disability: (P) Pt could not recall Patient's Job has Been Impacted by Current Illness: (P) No What is the Longest Time Patient has Held a Job?: (P) 17 years  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  Has Patient ever Been in Equities Trader?: (P) No  Financial Resources:   Surveyor, Quantity resources: (P) Receives SSDI, Medicare Does patient have a lawyer or guardian?: (P) Yes Name of representative payee or guardian: (P) [name] - patient's son  Alcohol /Substance Abuse:   What has been your use of drugs/alcohol  within the last 12 months?: (P) N/A  Social Support System:   Patient's Community Support System: (P) Good Describe Community Support System: (P) Sons Type of faith/religion: (P) Christian How does patient's faith help to cope with current illness?: (P) Having faith in God  Leisure/Recreation:   Do You Have Hobbies?: (P) Yes Leisure and Hobbies: (P) reading, gardening, skiing, sewing, cooking  Strengths/Needs:   What is the patient's perception of their strengths?: (P) I understand medicine and have worked in it my whole life, I enjoy exercise  Discharge Plan:   Currently receiving community mental health services: (P) Yes (From Whom) (Dr. Luan [facility])) Does patient have access to transportation?: (P) Yes (Pt uses [facility])  Does patient have financial barriers related to discharge medications?: (P) No Plan for living situation after discharge: (P) Pt wants to be placed  in a different living facility prior to discharge Will patient be returning to same living situation after discharge?: (P) No (Pt does not want to return to the nursing home at discharge)  Summary/Recommendations:     Patient is a 74 year old single female from  [place], [place] She reported to the hospital with complaints of worsening depressive symptoms, & endorsing suicidal ideations with a plan to overdose on pills. Patient reports she recently moved into [facility] assisted living facility however she indicates she does not wish to go back there. She is seeking assistance with alternative placement at discharge. Recommendations include: crisis stabilization, therapeutic milieu, encourage group attendance and participation, medication management for mood stabilization and development of comprehensive mental wellness plan.  Aldo HERO Chinita Schimpf. 04/29/2024

## 2024-04-29 NOTE — Plan of Care (Signed)
  Problem: Education: Goal: Emotional status will improve Outcome: Progressing   Problem: Activity: Goal: Sleeping patterns will improve Outcome: Progressing   Problem: Coping: Goal: Ability to demonstrate self-control will improve Outcome: Progressing

## 2024-04-29 NOTE — Progress Notes (Signed)
°   04/29/24 0800  Psych Admission Type (Psych Patients Only)  Admission Status Voluntary  Psychosocial Assessment  Patient Complaints Anxiety;Depression  Eye Contact Fair  Facial Expression Anxious  Affect Appropriate to circumstance  Speech Logical/coherent  Interaction Assertive  Motor Activity Unsteady  Appearance/Hygiene In scrubs  Behavior Characteristics Cooperative  Mood Pleasant  Thought Process  Coherency WDL  Content WDL  Delusions None reported or observed  Perception WDL  Hallucination None reported or observed  Judgment WDL  Confusion Mild (as per pt and chart)  Danger to Self  Current suicidal ideation? Denies  Danger to Others  Danger to Others None reported or observed

## 2024-04-29 NOTE — Progress Notes (Signed)
°   04/28/24 2322  Psych Admission Type (Psych Patients Only)  Admission Status Voluntary  Psychosocial Assessment  Patient Complaints Anxiety;Depression  Eye Contact Fair  Facial Expression Worried  Affect Appropriate to circumstance  Speech Logical/coherent  Interaction Assertive  Motor Activity Unsteady  Appearance/Hygiene In scrubs  Behavior Characteristics Cooperative;Appropriate to situation  Mood Anxious;Pleasant  Thought Process  Coherency WDL  Content WDL  Delusions None reported or observed  Perception WDL  Hallucination None reported or observed  Judgment WDL  Confusion Mild  Danger to Self  Current suicidal ideation? Denies  Description of Suicide Plan n/a  Agreement Not to Harm Self Yes  Description of Agreement verbal  Danger to Others  Danger to Others None reported or observed   Estimated Sleeping Duration (Last 24 Hours): 6.75-7.75 hours

## 2024-04-29 NOTE — Plan of Care (Signed)
°  Problem: Safety: Goal: Periods of time without injury will increase Outcome: Progressing   Problem: Education: Goal: Ability to state activities that reduce stress will improve Outcome: Progressing   Problem: Education: Goal: Utilization of techniques to improve thought processes will improve Outcome: Progressing

## 2024-04-29 NOTE — Plan of Care (Signed)
°  Problem: Education: Goal: Verbalization of understanding the information provided will improve Outcome: Progressing   Problem: Education: Goal: Mental status will improve Outcome: Not Progressing   Problem: Activity: Goal: Interest or engagement in activities will improve Outcome: Not Progressing Goal: Sleeping patterns will improve Outcome: Not Progressing

## 2024-04-30 ENCOUNTER — Ambulatory Visit: Admitting: Psychology

## 2024-04-30 LAB — URINE CULTURE: Culture: >=100000 — AB

## 2024-04-30 NOTE — Progress Notes (Signed)
°   04/30/24 2055  Psych Admission Type (Psych Patients Only)  Admission Status Voluntary  Psychosocial Assessment  Patient Complaints Anxiety  Eye Contact Fair  Facial Expression Animated  Affect Appropriate to circumstance  Speech Logical/coherent  Interaction Assertive  Motor Activity Unsteady  Appearance/Hygiene In scrubs  Behavior Characteristics Cooperative;Appropriate to situation  Mood Pleasant  Thought Process  Coherency WDL  Content WDL  Delusions None reported or observed  Perception WDL  Hallucination None reported or observed  Judgment WDL  Confusion WDL  Danger to Self  Current suicidal ideation? Denies  Agreement Not to Harm Self Yes  Description of Agreement Verbal  Danger to Others  Danger to Others None reported or observed

## 2024-04-30 NOTE — Plan of Care (Signed)
   Problem: Education: Goal: Emotional status will improve Outcome: Progressing   Problem: Activity: Goal: Interest or engagement in activities will improve Outcome: Progressing

## 2024-04-30 NOTE — Group Note (Signed)
 Date:  04/30/2024 Time:  9:44 PM  Group Topic/Focus:  Wrap-Up Group:   The focus of this group is to help patients review their daily goal of treatment and discuss progress on daily workbooks.    Participation Level:  Did Not Attend  Participation Quality:     Affect:     Cognitive:     Insight: None  Engagement in Group:  None  Modes of Intervention:     Additional Comments:    Tommas CHRISTELLA Bunker 04/30/2024, 9:44 PM

## 2024-04-30 NOTE — Group Note (Signed)
 Recreation Therapy Group Note   Group Topic:Emotion Expression  Group Date: 04/30/2024 Start Time: 1410 End Time: 1500 Facilitators: Celestia Jeoffrey BRAVO, LRT, CTRS Location: Craft Room  Group Description: Positivity Collage. LRT and patients discussed the importance of having a positive mindset and being happy. Patients received magazines, safety scissors, a glue stick and a piece of paper. Pts were encouraged to find images or words in the magazines that showed happiness or positivity to them. Pt shared their collage with the group once they were finished. LRT and pts discussed how it can be difficult to always have a positive mindset, especially when they have mental health challenges.   Goal Area(s) Addressed:  Pt will identify things associate with positivity. Pt will reduce negative thinking. Pt will identify a new coping skill of thinking positive thoughts.    Affect/Mood: N/A   Participation Level: Did not attend    Clinical Observations/Individualized Feedback: Patient did not attend group.  Plan: Continue to engage patient in RT group sessions 2-3x/week.   Jeoffrey BRAVO Celestia, LRT, CTRS 04/30/2024 4:31 PM

## 2024-04-30 NOTE — Group Note (Signed)
 Date:  04/30/2024 Time:  12:09 PM  Group Topic/Focus:   Coping With Mental Health Crisis:   The purpose of this group is to help patients identify strategies for coping with mental health crisis.  Group discusses possible causes of crisis and ways to manage them effectively. Personal Choices and Values:   The focus of this group is to help patients assess and explore the importance of values in their lives, how their values affect their decisions, how they express their values and what opposes their expression.  The patients had the opportunity to create vision boards and set goals for the upcoming new year. We discussed new goals and dreams, along with realistic time frames for accomplishing them. This activity encouraged reflection, motivation, and hope. It also served as a print production planner experience and promoted positivity and a more optimistic outlook on life.    Participation Level:  Did Not Attend  Participation Quality:    Affect:    Cognitive:    Insight:   Engagement in Group:    Modes of Intervention:    Additional Comments:    Francenia Chimenti L Symon Norwood 04/30/2024, 12:09 PM

## 2024-04-30 NOTE — [facility] Counselor (Signed)
 CSW made a referral to [facility] per pt's request for assistance with placement.   Lum Croft, MSW, [place] 04/30/2024 3:41 PM

## 2024-04-30 NOTE — [facility] Counselor (Signed)
 CSW made a referral for [facility] per pt's request for assistance with placement.   Lum Croft, MSW, [place] 04/30/2024 3:41 PM

## 2024-04-30 NOTE — Group Note (Signed)
 Physical/Occupational Therapy Group Note  Group Topic: Yoga  Group Date: 04/30/2024 Start Time: 1500 End Time: 1530 Facilitators: Taelyn Broecker, Alm Hamilton, PT   Group Description: Group participated with series of yoga poses, designed to emphasize functional sitting balance, core stability, generalized flexibility and overall posture.  Incorporated deep breathing techniques with poses, working to promote relaxation, mindfulness and focus with targeted activities.  Discussed benefits of yoga in improving mood and self-esteem, reducing stress and anxiety, and promoting functional strength, balance and core stability for each participant.  Discussed ways to integrate into each participants daily routine.  Provided handout with written and pictorial descriptions of included yoga movements to be utilized as appropriate outside of group time.  Therapeutic Goal(s):  Demonstrate safe ability to participate with yoga poses during group activity. Identify one benefit of participation with yoga poses as part of each participants exercise/movement routine. Identify 1-2 individual poses that participant feels most beneficial to his/her needs and that he/she can easily replicate outside of group.  Individual Participation: Did not attend  Participation Level:   Participation Quality:   Behavior:   Speech/Thought Process:   Affect/Mood:   Insight:   Judgement:   Modes of Intervention:   Patient Response to Interventions:    Plan: Continue to engage patient in PT/OT groups 1 - 2x/week.  CHARM Hamilton Bertin PT, DPT 04/30/2024, 5:05 PM

## 2024-04-30 NOTE — Progress Notes (Signed)
°   04/30/24 0008  Psych Admission Type (Psych Patients Only)  Admission Status Voluntary  Psychosocial Assessment  Patient Complaints Anxiety;Depression  Eye Contact Fair  Facial Expression Anxious  Affect Appropriate to circumstance  Speech Logical/coherent  Interaction Assertive  Motor Activity Unsteady  Appearance/Hygiene In scrubs  Behavior Characteristics Cooperative  Mood Pleasant  Thought Process  Coherency WDL  Content WDL  Delusions None reported or observed  Perception WDL  Hallucination None reported or observed  Judgment WDL  Confusion Mild  Danger to Self  Current suicidal ideation? Denies  Agreement Not to Harm Self Yes  Description of Agreement verbal  Danger to Others  Danger to Others None reported or observed    Estimated Sleeping Duration (Last 24 Hours): 8.25-10.50 hours

## 2024-04-30 NOTE — Progress Notes (Unsigned)
 SABRA

## 2024-04-30 NOTE — Plan of Care (Signed)
°  Problem: Activity: Goal: Interest or engagement in activities will improve Outcome: Not Progressing Goal: Sleeping patterns will improve Outcome: Progressing   Problem: Physical Regulation: Goal: Ability to maintain clinical measurements within normal limits will improve Outcome: Progressing   Problem: Safety: Goal: Periods of time without injury will increase Outcome: Progressing

## 2024-04-30 NOTE — Progress Notes (Signed)
 D- Patient alert and oriented x 4. Pt presents with a pleasant mood and affect. Pt denies SI, HI, AVH, but endorses headache pain 8/10. Pt in her room until lunch time. Pt states she did not sleep well and resting during the morning. Pt states her goal is to feel better and get her medication regulated and obtain a new place to stay while on the unit. Pt states,  I cannot go back to the nursing home I am at. Pt expressed feeling better after speaking with child psychotherapist. Pt BP a little low in the morning. Gatorade provided. Provider aware.   A- Scheduled/ PRN medications administered to patient, per MD orders with the exception of 10 am medication being administered off schedule. Pt asleep and not eating until lunch. Provider aware. Support and encouragement provided.  Routine safety checks conducted every 15 minutes.  Patient informed to notify staff with problems or concerns.  R- No adverse drug reactions noted. Patient contracts for safety at this time. Patient compliant with medications and treatment plan. Patient receptive, calm, and cooperative. Patient  did not interact  with others on the unit except lunch time and dinner time.  Patient remains safe at this time.    Mykell Rawl S.,RN

## 2024-04-30 NOTE — Plan of Care (Signed)
°  Problem: Education: Goal: Mental status will improve Outcome: Not Progressing   Problem: Activity: Goal: Interest or engagement in activities will improve Outcome: Not Progressing Goal: Sleeping patterns will improve Outcome: Not Progressing   Problem: Safety: Goal: Periods of time without injury will increase Outcome: Not Progressing

## 2024-05-01 ENCOUNTER — Telehealth (HOSPITAL_BASED_OUTPATIENT_CLINIC_OR_DEPARTMENT_OTHER): Payer: Self-pay | Admitting: *Deleted

## 2024-05-01 DIAGNOSIS — F332 Major depressive disorder, recurrent severe without psychotic features: Principal | ICD-10-CM | POA: Diagnosis present

## 2024-05-01 DIAGNOSIS — F0390 Unspecified dementia without behavioral disturbance: Secondary | ICD-10-CM | POA: Diagnosis not present

## 2024-05-01 MED ORDER — AMOXICILLIN-POT CLAVULANATE 875-125 MG PO TABS
1.0000 | ORAL_TABLET | Freq: Two times a day (BID) | ORAL | Status: AC
  Administered 2024-05-01 – 2024-05-05 (×10): 1 via ORAL
  Filled 2024-05-01 (×11): qty 1

## 2024-05-01 NOTE — Group Note (Signed)
 Date:  05/01/2024 Time:  3:57 PM  Group Topic/Focus:  Healthy Communication:   The focus of this group is to discuss communication, barriers to communication, as well as healthy ways to communicate with others. Making Healthy Choices:   The focus of this group is to help patients identify negative/unhealthy choices they were using prior to admission and identify positive/healthier coping strategies to replace them upon discharge.  I provided structured worksheets designed to promote cognitive engagement, social interaction, and conversation. Worksheets included activities that encouraged critical thinking and verbal expression, such as This or That (Would You Rather) questions and prompts focused on favorite memories. The group reviewed the worksheet together, with questions read aloud to support comprehension and inclusion.  In addition to the worksheet activities, the group participated in Guess That Song, which encouraged memory recall, attention, and peer interaction. An acts of kindness discussion was also facilitated, allowing participants to reflect on positive behaviors and share personal experiences.  Participants demonstrated engagement through verbal responses, shared memories, and interaction with peers. The group setting promoted socialization, reminiscence, and positive affect. Modifications were provided as needed, including verbal prompting and allowing responses to be given verbally rather than written.  The session supported cognitive stimulation, emotional expression, and group cohesion in a supportive environment.    Participation Level:  Did Not Attend  Participation Quality:    Affect:    Cognitive:    Insight:   Engagement in Group:    Modes of Intervention:    Additional Comments:    Tamlyn Sides L Germaine Ripp 05/01/2024, 3:57 PM

## 2024-05-01 NOTE — Group Note (Signed)
 Recreation Therapy Group Note   Group Topic:General Recreation  Group Date: 05/01/2024 Start Time: 1405 End Time: 1450 Facilitators: Celestia Jeoffrey BRAVO, LRT, CTRS Location: Courtyard  Group Description: [employer]. LRT and patients played games of basketball, drew with chalk, and played corn hole while outside in the courtyard while getting fresh air and sunlight. Music was being played in the background. LRT and peers conversed about different games they have played before, what they do in their free time and anything else that is on their minds. LRT encouraged pts to drink water  after being outside, sweating and getting their heart rate up.  Goal Area(s) Addressed: Patient will build on frustration tolerance skills. Patients will partake in a competitive play game with peers. Patients will gain knowledge of new leisure interest/hobby.    Affect/Mood: N/A   Participation Level: Did not attend    Clinical Observations/Individualized Feedback: Patient did not attend group.  Plan: Continue to engage patient in RT group sessions 2-3x/week.   Jeoffrey BRAVO Celestia, LRT, CTRS 05/01/2024 4:33 PM

## 2024-05-01 NOTE — Plan of Care (Signed)
  Problem: Education: Goal: Ability to state activities that reduce stress will improve Outcome: Not Progressing   

## 2024-05-01 NOTE — [facility] Suicide Risk Assessment (Signed)
 [facility] INPATIENT:  Family/Significant Other Suicide Prevention Education  Suicide Prevention Education:  Education Completed; [name], son, [phone] ,  (name of family member/significant other) has been identified by the patient as the family member/significant other with whom the patient will be residing, and identified as the person(s) who will aid the patient in the event of a mental health crisis (suicidal ideations/suicide attempt).  With written consent from the patient, the family member/significant other has been provided the following suicide prevention education, prior to the and/or following the discharge of the patient.  The suicide prevention education provided includes the following: Suicide risk factors Suicide prevention and interventions National Suicide Hotline telephone number [facility] assessment telephone number [facility] Emergency Assistance 911 [facility] and/or Residential Mobile Crisis Unit telephone number  Request made of family/significant other to: Remove weapons (e.g., guns, rifles, knives), all items previously/currently identified as safety concern.   Remove drugs/medications (over-the-counter, prescriptions, illicit drugs), all items previously/currently identified as a safety concern.  The family member/significant other verbalizes understanding of the suicide prevention education information provided.  The family member/significant other agrees to remove the items of safety concern listed above.  Pt's son reports pt has a long hx of anxiety, depression, and manic behavior.   Reports for many years he has tried to have realistic conversations with pt about her finances and her placement.  I have never know her to be an emotionally secure and stable person  Reports pt would not listen, and had no choice but to let him become POA (financial and healthcare)  Reports pt has some dementia coming on, tendency to react emotionally  rather than think things through, racked up a couple debts that he has had to help pt resolve.   Pt's son reports that pt when she lived on her own she started forgetting her medications, stopped eating, sedentary , temporarily upgraded her needs     [name] 05/01/2024, 11:09 AM

## 2024-05-01 NOTE — Group Note (Signed)
 LCSW Group Therapy Note   Group Date: 05/01/2024 Start Time: 1315 End Time: 1330   Type of Therapy and Topic:  Group Therapy: Boundaries  Participation Level:  Did Not Attend  Description of Group: This group will address the use of boundaries in their personal lives. Patients will explore why boundaries are important, the difference between healthy and unhealthy boundaries, and negative and postive outcomes of different boundaries and will look at how boundaries can be crossed.  Patients will be encouraged to identify current boundaries in their own lives and identify what kind of boundary is being set. Facilitators will guide patients in utilizing problem-solving interventions to address and correct types boundaries being used and to address when no boundary is being used. Understanding and applying boundaries will be explored and addressed for obtaining and maintaining a balanced life. Patients will be encouraged to explore ways to assertively make their boundaries and needs known to significant others in their lives, using other group members and facilitator for role play, support, and feedback.  Therapeutic Goals:  1.  Patient will identify areas in their life where setting clear boundaries could be  used to improve their life.  2.  Patient will identify signs/triggers that a boundary is not being respected. 3.  Patient will identify two ways to set boundaries in order to achieve balance in  their lives: 4.  Patient will demonstrate ability to communicate their needs and set boundaries  through discussion and/or role plays  Summary of Patient Progress:  x  Therapeutic Modalities:   Cognitive Behavioral Therapy Solution-Focused Therapy  [name], LCSWA 05/01/2024  1:52 PM

## 2024-05-01 NOTE — Progress Notes (Signed)
°   05/01/24 1600  Psych Admission Type (Psych Patients Only)  Admission Status Voluntary  Psychosocial Assessment  Patient Complaints Anxiety  Eye Contact Fair  Facial Expression Anxious  Affect Appropriate to circumstance  Speech Logical/coherent  Interaction Assertive  Motor Activity Unsteady  Appearance/Hygiene In scrubs  Behavior Characteristics Cooperative  Mood Pleasant  Thought Process  Coherency WDL  Content WDL  Delusions WDL  Perception WDL  Hallucination None reported or observed  Judgment WDL  Confusion WDL  Danger to Self  Current suicidal ideation? Denies  Danger to Others  Danger to Others None reported or observed

## 2024-05-01 NOTE — Group Note (Signed)
 Date:  05/01/2024 Time:  11:08 PM  Group Topic/Focus:  Wrap-Up Group:   The focus of this group is to help patients review their daily goal of treatment and discuss progress on daily workbooks.    Participation Level:  Did Not Attend  Participation Quality:     Affect:     Cognitive:     Insight: None  Engagement in Group:  None  Modes of Intervention:     Additional Comments:    Tommas CHRISTELLA Bunker 05/01/2024, 11:08 PM
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
## 2024-05-01 NOTE — Telephone Encounter (Signed)
 Post ED Visit - Positive Culture Follow-up: Successful Patient Follow-Up  Culture assessed and recommendations reviewed by:  [x]  Damien Quiet, Pharm.D. []  Venetia Gully, Pharm.D., BCPS AQ-ID []  Garrel Crews, Pharm.D., BCPS []  Almarie Lunger, Pharm.D., BCPS []  [place], [place].D., BCPS, AAHIVP []  Rosaline Bihari, Pharm.D., BCPS, AAHIVP []  Vernell Meier, PharmD, BCPS []  Latanya Hint, PharmD, BCPS []  Donald Medley, PharmD, BCPS []  Rocky Bold, PharmD  Positive urine culture  []  Patient discharged without antimicrobial prescription and treatment is now indicated [x]  Organism is resistant to prescribed ED discharge antimicrobial []  Patient with positive blood cultures  Changes discussed with ED provider: Patient currently admitted, Pharmacist spoke the physician at [facility] about changing therapy. They will change Augmentin  or Levofloxacin. Contacted patient, date 12/162025, time 1151   [name] 05/01/2024, 11:48 AM

## 2024-05-01 NOTE — Group Note (Signed)
 Date:  05/01/2024 Time:  11:36 AM  Group Topic/Focus:  Goals Group:   The focus of this group is to help patients establish daily goals to achieve during treatment and discuss how the patient can incorporate goal setting into their daily lives to aide in recovery. Healthy Communication:   The focus of this group is to discuss communication, barriers to communication, as well as healthy ways to communicate with others.    Participation Level:  Did Not Attend  Participation Quality:    Affect:   Cognitive:    Insight:   Engagement in Group:    Modes of Intervention:    Additional Comments:    Calie Buttrey L Lekesha Claw 05/01/2024, 11:36 AM

## 2024-05-01 NOTE — Evaluation (Signed)
 Occupational Therapy Evaluation Patient Details Name: [name] MRN: [id number] DOB: 1950/12/26 Today's Date: 05/01/2024   History of Present Illness   73 year old female with a history of bipolar disorder and chronic medical conditions who presents with worsening depressive symptoms and suicidal ideation. The patient reports a long history of multiple medical surgeries and hospitalizations over the past 12 years, which have significantly impacted her physical and emotional well-being. Her depressive symptoms have gradually worsened over the past month, primarily following a change in her roommate at [facility].     Clinical Impressions Upon entering the room, pt supine in bed and very pleasant and cooperative during session. Pt reports recently living at [facility]. She endorses several coping strategies she has been utilizing but has been unable to sleep well since getting new very cognitively impaired roommate. Pt states that when living alone she was unable to manage her medications. This session pt demonstrates ability to perform all self care and mobility with RW without assistance at mod I level. Pt is given SLUM this session.   Pt completed SLUMS examination this date scoring 25/30 indicating a normal score for someone with high school education or higher.  Of note, it is not within occupational therapy scope of practice to diagnose cognitive impairments, this screen indicates need for further testing. The SLUMS is a 30 point, 11 question screening questionnaire that tests orientation, memory, attention, and executive function. Missed questions involved short term memory and reading comprehension questions which relate back to medication management and why she would need assistance. Pt is not necessarily appropriate to return to SNF level of care. She may be more appropriate for ALF as she needs assistance with finances, medication, and  intermittent supervision. Pt does not need 24/7 supervision for safety based on today's observation and assessment.     If plan is discharge home, recommend the following:   Assistance with cooking/housework;Assist for transportation;Direct supervision/assist for medications management;Direct supervision/assist for financial management     Functional Status Assessment   Patient has not had a recent decline in their functional status     Equipment Recommendations   None recommended by OT      Precautions/Restrictions   Precautions Precautions: Fall     Mobility Bed Mobility Overal bed mobility: Independent                  Transfers Overall transfer level: Modified independent Equipment used: Rolling walker (2 wheels)                      Balance Overall balance assessment: Modified Independent                                         ADL either performed or assessed with clinical judgement   ADL Overall ADL's : Modified independent                                             Vision Patient Visual Report: No change from baseline              Pertinent Vitals/Pain Pain Assessment Pain Assessment: No/denies pain     Extremity/Trunk Assessment Upper Extremity Assessment Upper Extremity Assessment: Overall WFL for tasks assessed   Lower  Extremity Assessment Lower Extremity Assessment: Overall WFL for tasks assessed       Communication Communication Communication: No apparent difficulties   Cognition Arousal: Alert Behavior During Therapy: WFL for tasks assessed/performed Cognition: Cognition impaired             OT - Cognition Comments: Pt has some mild memory deficits. SLUMS score of 25/30 on 05/01/24                 Following commands: Intact                  Home Living Family/patient expects to be discharged to:: Skilled nursing facility                                  Additional Comments: pt currently lives at [facility]      Prior Functioning/Environment Prior Level of Function : Needs assist;Independent/Modified Independent               ADLs Comments: Mod I with use of RW for mobility and ADLs with staff assisting with medications            OT Goals(Current goals can be found in the care plan section)   Acute Rehab OT Goals Patient Stated Goal: to go to an ALF OT Goal Formulation: With patient Time For Goal Achievement: 05/01/24 Potential to Achieve Goals: Fair   AM-PAC OT 6 Clicks Daily Activity     Outcome Measure Help from another person eating meals?: None Help from another person taking care of personal grooming?: None Help from another person toileting, which includes using toliet, bedpan, or urinal?: None Help from another person bathing (including washing, rinsing, drying)?: None Help from another person to put on and taking off regular upper body clothing?: None Help from another person to put on and taking off regular lower body clothing?: None 6 Click Score: 24   End of Session Equipment Utilized During Treatment: Rolling walker (2 wheels) Nurse Communication: Mobility status  Activity Tolerance: Patient tolerated treatment well Patient left: in bed                   Time: 1540-1610 OT Time Calculation (min): 30 min Charges:  OT General Charges $OT Visit: 1 Visit OT Evaluation $OT Eval Low Complexity: 1 Low OT Treatments $Therapeutic Activity: 8-22 mins Izetta Claude, MS, OTR/L , CBIS ascom [phone]  05/01/2024, 4:21 PM

## 2024-05-02 DIAGNOSIS — F332 Major depressive disorder, recurrent severe without psychotic features: Secondary | ICD-10-CM | POA: Diagnosis not present

## 2024-05-02 MED ORDER — MENTHOL 3 MG MT LOZG
1.0000 | LOZENGE | OROMUCOSAL | Status: DC | PRN
  Administered 2024-05-02: 3 mg via ORAL
  Filled 2024-05-02: qty 9

## 2024-05-02 NOTE — Group Note (Signed)
 Date:  05/02/2024 Time:  3:48 PM  Group Topic/Focus:  Emotional Education:   The focus of this group is to discuss what feelings/emotions are, and how they are experienced.    Participation Level:  Did Not Attend   [name] 05/02/2024, 3:48 PM

## 2024-05-02 NOTE — Progress Notes (Signed)
 SI/HI: denies  Behavior/Mood: Calm and cooperative. Pt is talkative. Reports improved mood and feeling safe in the hospital. Expressed fears of returning to [facility] and voiced multiple concerns about the facility. Support and encouragement provided.   Interaction/Group: Pt remain in bed throughout the shift. Did not attend group.   Medications/PRNs: PO med compliant. PRN given for sore throat.   Pain: pt c/o sore throat. NP notified. N.O. Cepacol lozenge 3 mg po as needed.   Other: slept 10.75 hours   05/01/24 2100  Psych Admission Type (Psych Patients Only)  Admission Status Voluntary  Psychosocial Assessment  Patient Complaints Anxiety  Eye Contact Fair  Facial Expression Anxious  Affect Appropriate to circumstance  Speech Logical/coherent  Interaction Assertive  Motor Activity Unsteady  Appearance/Hygiene In scrubs  Behavior Characteristics Cooperative  Mood Pleasant  Thought Process  Coherency WDL  Content Blaming others  Delusions None reported or observed  Perception WDL  Hallucination None reported or observed  Judgment Impaired  Confusion None  Danger to Self  Current suicidal ideation? Denies

## 2024-05-02 NOTE — Plan of Care (Signed)
  Problem: Education: Goal: Ability to state activities that reduce stress will improve Outcome: Progressing   Problem: Education: Goal: Utilization of techniques to improve thought processes will improve Outcome: Progressing   
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
## 2024-05-02 NOTE — Progress Notes (Signed)
 SI/HI/AVH: denies all  Behavior/Mood: cooperative/pleasant    Interaction/Group attendance: assertive/ none   Medication/PRNs: compliant/ hydrox x1   Pain: denies  Other: discharge planning ongoing

## 2024-05-02 NOTE — Plan of Care (Signed)
   Problem: Education: Goal: Emotional status will improve Outcome: Progressing Goal: Mental status will improve Outcome: Progressing

## 2024-05-02 NOTE — Group Note (Signed)
 Date:  05/02/2024 Time:  8:52 PM  Group Topic/Focus:  Wrap-Up Group:   The focus of this group is to help patients review their daily goal of treatment and discuss progress on daily workbooks.    Participation Level:  Active  Participation Quality:  Appropriate  Affect:  Excited  Cognitive:  Alert  Insight: Appropriate  Engagement in Group:  Engaged  Modes of Intervention:  Discussion  Additional Comments:    [name] 05/02/2024, 8:52 PM

## 2024-05-02 NOTE — BH IP Treatment Plan (Signed)
 Interdisciplinary Treatment and Diagnostic Plan Update  05/02/2024 Time of Session: 10:20 AM [name] MRN: [id number]  Principal Diagnosis: MDD (major depressive disorder), recurrent episode, severe (HCC)  Secondary Diagnoses: Active Problems:   MDD (major depressive disorder), recurrent severe, without psychosis (HCC)   Current Medications:  Current Facility-Administered Medications  Medication Dose Route Frequency Provider Last Rate Last Admin   acetaminophen  (TYLENOL ) tablet 650 mg  650 mg Oral Q6H PRN Bobbitt, Shalon E, NP   650 mg at 04/29/24 1409   ALPRAZolam  (XANAX ) tablet 1 mg  1 mg Oral TID White, Patrice L, NP   1 mg at 05/02/24 9071   amoxicillin -clavulanate (AUGMENTIN ) 875-125 MG per tablet 1 tablet  1 tablet Oral Q12H Jadapalle, Sree, MD   1 tablet at 05/02/24 9071   ascorbic acid  (VITAMIN C ) tablet 500 mg  500 mg Oral Daily White, Patrice L, NP   500 mg at 05/02/24 9071   busPIRone  (BUSPAR ) tablet 30 mg  30 mg Oral BID White, Patrice L, NP   30 mg at 05/02/24 9071   carvedilol  (COREG ) tablet 12.5 mg  12.5 mg Oral BID WC White, Patrice L, NP   12.5 mg at 05/02/24 0757   cloNIDine  (CATAPRES ) tablet 0.2 mg  0.2 mg Oral QHS White, Patrice L, NP   0.2 mg at 05/01/24 2135   colesevelam  (WELCHOL ) tablet 1,250 mg  1,250 mg Oral BID WC White, Patrice L, NP   1,250 mg at 05/02/24 0757   cyanocobalamin  (VITAMIN B12) tablet 1,000 mcg  1,000 mcg Oral Daily White, Patrice L, NP   1,000 mcg at 05/02/24 9071   ferrous sulfate  tablet 325 mg  325 mg Oral Daily White, Patrice L, NP   325 mg at 05/02/24 9071   FLUoxetine  (PROZAC ) capsule 40 mg  40 mg Oral Daily White, Patrice L, NP   40 mg at 05/02/24 9071   hydrALAZINE  (APRESOLINE ) tablet 10 mg  10 mg Oral TID Teresa Jes L, NP   10 mg at 05/02/24 9071   hydrOXYzine  (ATARAX ) tablet 50 mg  50 mg Oral Q6H PRN Madaram, Kondal R, MD   50 mg at 04/30/24 1615   levothyroxine  (SYNTHROID ) tablet 100 mcg  100 mcg Oral QAC breakfast  White, Patrice L, NP   100 mcg at 05/02/24 9342   loratadine  (CLARITIN ) tablet 10 mg  10 mg Oral Daily White, Patrice L, NP   10 mg at 05/02/24 9071   menthol  (CEPACOL) lozenge 3 mg  1 lozenge Oral PRN McLauchlin, Angela, NP   3 mg at 05/02/24 0026   mirtazapine  (REMERON ) tablet 30 mg  30 mg Oral QHS White, Patrice L, NP   30 mg at 05/01/24 2132   nystatin  (MYCOSTATIN /NYSTOP ) topical powder 1 Application  1 Application Topical QHS White, Patrice L, NP   1 Application at 05/01/24 2142   OLANZapine  (ZYPREXA ) injection 5 mg  5 mg Intramuscular TID PRN White, Patrice L, NP       OLANZapine  zydis (ZYPREXA ) disintegrating tablet 5 mg  5 mg Oral TID PRN White, Patrice L, NP       omega-3 acid ethyl esters (LOVAZA ) capsule 2 g  2 capsule Oral BID White, Patrice L, NP   2 g at 05/02/24 0929   pantoprazole  (PROTONIX ) EC tablet 40 mg  40 mg Oral BID White, Patrice L, NP   40 mg at 05/02/24 9071   polyethylene glycol (MIRALAX  / GLYCOLAX ) packet 17 g  17 g Oral Daily PRN Teresa Jes  L, NP       psyllium (HYDROCIL/METAMUCIL) 1 packet  1 packet Oral BID White, Patrice L, NP   1 packet at 05/02/24 9073   rosuvastatin  (CRESTOR ) tablet 10 mg  10 mg Oral Daily White, Patrice L, NP   10 mg at 05/02/24 9070   saccharomyces boulardii (FLORASTOR) capsule 250 mg  250 mg Oral BID White, Patrice L, NP   250 mg at 05/02/24 1009   traMADol  (ULTRAM ) tablet 50 mg  50 mg Oral Q6H PRN White, Patrice L, NP   50 mg at 05/02/24 0028   PTA Medications: Medications Prior to Admission  Medication Sig Dispense Refill Last Dose/Taking   acetaminophen  (TYLENOL ) 500 MG tablet Take 500 mg by mouth 2 (two) times daily.      ALPRAZolam  (XANAX ) 1 MG tablet Take 1 tablet (1 mg total) by mouth 3 (three) times daily. (Patient taking differently: Take 1 mg by mouth 3 (three) times daily. Home is only giving her twice daily instead of TID) 90 tablet 5    ascorbic acid  (VITAMIN C ) 500 MG tablet Take 1 tablet (500 mg total) by mouth daily. 30  tablet 11    aspirin  EC (ASPIRIN  LOW DOSE) 81 MG tablet Take 1 tablet (81 mg total) by mouth daily. Swallow whole. 30 tablet 3    busPIRone  (BUSPAR ) 30 MG tablet Take 1 tablet (30 mg total) by mouth 2 (two) times daily. 60 tablet 3    carvedilol  (COREG ) 12.5 MG tablet Take 1 tablet (12.5 mg total) by mouth every evening. (Patient taking differently: Take 12.5 mg by mouth in the morning and at bedtime.) 90 tablet 3    cetirizine (ZYRTEC) 10 MG tablet Take 10 mg by mouth daily.      Cholecalciferol  (D3 5000) 125 MCG (5000 UT) capsule Take 1 capsule (5,000 Units total) by mouth daily. 30 capsule 11    cloNIDine  (CATAPRES ) 0.2 MG tablet Take 1 tablet (0.2 mg total) by mouth at bedtime. 90 tablet 3    colesevelam  (WELCHOL ) 625 MG tablet Take 2 tablets (1,250 mg total) by mouth 2 (two) times daily with a meal. 360 tablet 3    cyanocobalamin  (VITAMIN B12) 1000 MCG tablet Take 1 tablet (1,000 mcg total) by mouth daily. Take 1,000 mcg by mouth daily. 30 tablet 11    D-Mannose 500 MG CAPS Take 1 capsule (500 mg total) by mouth 2 (two) times daily. WITH CRANBERRY & DANDELION EXTRACT 60 capsule 11    diphenoxylate -atropine  (LOMOTIL ) 2.5-0.025 MG tablet Take 1 tablet by mouth 4 (four) times daily as needed for diarrhea or loose stools. 20 tablet 0    ferrous sulfate  (FEROSUL) 325 (65 FE) MG tablet Take 325 mg by mouth daily.      FLUoxetine  (PROZAC ) 40 MG capsule Take 1 capsule (40 mg total) by mouth daily. 30 capsule 11    fluticasone  (FLONASE ) 50 MCG/ACT nasal spray Place 2 sprays into both nostrils daily.      hydrALAZINE  (APRESOLINE ) 10 MG tablet Take 1 tablet by mouth 3 times daily. MAY TAKE EXTRA 10MG  FOR SBP>160 (Patient taking differently: Take 10 mg by mouth 3 (three) times daily.) 120 tablet 9    levothyroxine  (SYNTHROID ) 100 MCG tablet Take 1 tablet (100 mcg total) by mouth daily before breakfast. 30 tablet 11    loperamide  (IMODIUM  A-D) 2 MG tablet Take 1 tablet (2 mg total) by mouth 4 (four) times  daily as needed for diarrhea or loose stools. 100 tablet 11  Metamucil Fiber 2 g CHEW Chew 2 tablets by mouth in the morning and at bedtime. 120 tablet 11    mirtazapine  (REMERON ) 45 MG tablet Take 45 mg by mouth at bedtime.      nitrofurantoin , macrocrystal-monohydrate, (MACROBID ) 100 MG capsule Take 1 capsule (100 mg total) by mouth 2 (two) times daily. 10 capsule 0    nystatin  (MYCOSTATIN /NYSTOP ) powder Use every day on rash (Patient taking differently: Apply 1 Application topically at bedtime. Apply to under bilateral breast for moisture) 60 g 5    omega-3 acid ethyl esters (LOVAZA ) 1 g capsule Take 2 capsules (2 g total) by mouth 2 (two) times daily. 120 capsule 11    omeprazole  (PRILOSEC) 20 MG capsule Take 20 mg by mouth daily.      pantoprazole  (PROTONIX ) 40 MG tablet Take 40 mg by mouth 2 (two) times daily.      rosuvastatin  (CRESTOR ) 10 MG tablet Take 1 tablet (10 mg total) by mouth daily. 90 tablet 3    Saccharomyces boulardii (PROBIOTIC) 250 MG CAPS Take 250 mg by mouth 2 (two) times daily.      sodium chloride  (OCEAN) 0.65 % SOLN nasal spray Place 1 spray into both nostrils daily. 30 mL 1    traMADol  (ULTRAM ) 50 MG tablet Take 1 tablet (50 mg total) by mouth every 6 (six) hours as needed for severe pain (pain score 7-10). 120 tablet 2     Patient Stressors: Health problems    Patient Strengths: Ability for insight  Average or above average intelligence  Communication skills  General fund of knowledge  Religious Affiliation  Supportive family/friends   Treatment Modalities: Medication Management, Group therapy, Case management,  1 to 1 session with clinician, Psychoeducation, Recreational therapy.   Physician Treatment Plan for Primary Diagnosis: MDD (major depressive disorder), recurrent episode, severe (HCC) Long Term Goal(s):     Short Term Goals:    Medication Management: Evaluate patient's response, side effects, and tolerance of medication regimen.  Therapeutic  Interventions: 1 to 1 sessions, Unit Group sessions and Medication administration.  Evaluation of Outcomes: Not Met  Physician Treatment Plan for Secondary Diagnosis: Active Problems:   MDD (major depressive disorder), recurrent severe, without psychosis (HCC)  Long Term Goal(s):     Short Term Goals:       Medication Management: Evaluate patient's response, side effects, and tolerance of medication regimen.  Therapeutic Interventions: 1 to 1 sessions, Unit Group sessions and Medication administration.  Evaluation of Outcomes: Not Met   RN Treatment Plan for Primary Diagnosis: MDD (major depressive disorder), recurrent episode, severe (HCC) Long Term Goal(s): Knowledge of disease and therapeutic regimen to maintain health will improve  Short Term Goals: Ability to verbalize frustration and anger appropriately will improve, Ability to demonstrate self-control, Ability to participate in decision making will improve, Ability to verbalize feelings will improve, Ability to disclose and discuss suicidal ideas, and Ability to identify and develop effective coping behaviors will improve  Medication Management: RN will administer medications as ordered by provider, will assess and evaluate patient's response and provide education to patient for prescribed medication. RN will report any adverse and/or side effects to prescribing provider.  Therapeutic Interventions: 1 on 1 counseling sessions, Psychoeducation, Medication administration, Evaluate responses to treatment, Monitor vital signs and CBGs as ordered, Perform/monitor CIWA, COWS, AIMS and Fall Risk screenings as ordered, Perform wound care treatments as ordered.  Evaluation of Outcomes: Not Met   LCSW Treatment Plan for Primary Diagnosis: MDD (major depressive disorder), recurrent  episode, severe (HCC) Long Term Goal(s): Safe transition to appropriate next level of care at discharge, Engage patient in therapeutic group addressing  interpersonal concerns.  Short Term Goals: Engage patient in aftercare planning with referrals and resources, Increase social support, Increase ability to appropriately verbalize feelings, Increase emotional regulation, Facilitate acceptance of mental health diagnosis and concerns, Facilitate patient progression through stages of change regarding substance use diagnoses and concerns, Identify triggers associated with mental health/substance abuse issues, and Increase skills for wellness and recovery  Therapeutic Interventions: Assess for all discharge needs, 1 to 1 time with Social worker, Explore available resources and support systems, Assess for adequacy in community support network, Educate family and significant other(s) on suicide prevention, Complete Psychosocial Assessment, Interpersonal group therapy.  Evaluation of Outcomes: Not Met   Progress in Treatment: Attending groups: No. Participating in groups: No. Taking medication as prescribed: Yes. Toleration medication: Yes. Family/Significant other contact made: Yes, individual(s) contacted:  -Matther Alexa, son, ([name]) [phone] Patient understands diagnosis: Yes. Discussing patient identified problems/goals with staff: Yes. Medical problems stabilized or resolved: Yes. Denies suicidal/homicidal ideation: Yes. Issues/concerns per patient self-inventory: No. Other: None  New problem(s) identified: No, Describe:  None  New Short Term/Long Term Goal(s):detox, elimination of symptoms of psychosis, medication management for mood stabilization; elimination of SI thoughts; development of comprehensive mental wellness/sobriety plan.    Patient Goals:  To be in a new place where it's not like it is now.  Discharge Plan or Barriers: CSW to assist with the development of appropriate discharge plan.    Reason for Continuation of Hospitalization: Anxiety Delusions  Suicidal ideation  Estimated Length of Stay: 1-7 days.   Last 3  [place] Suicide Severity Risk Score: Flowsheet Row Admission (Current) from 04/28/2024 in [facility] Most recent reading at 04/28/2024  6:00 PM ED from 04/27/2024 in [facility] Emergency Department at [facility] Most recent reading at 04/27/2024  7:21 PM ED from 04/27/2024 in [facility] Most recent reading at 04/27/2024  3:03 PM  C-SSRS RISK CATEGORY Low Risk No Risk Low Risk    Last PHQ 2/9 Scores:    04/27/2024    2:24 PM 03/06/2024    4:24 PM 10/31/2023   11:27 AM  Depression screen PHQ 2/9  Decreased Interest 2 2 1   Down, Depressed, Hopeless 2 2 1   PHQ - 2 Score 4 4 2   Altered sleeping 2 2 1   Tired, decreased energy 2 2 1   Change in appetite 2 2 1   Feeling bad or failure about yourself  0 1 1  Trouble concentrating 3 3 1   Moving slowly or fidgety/restless 2 3 1   Suicidal thoughts 1 1 0  PHQ-9 Score 16 18  8    Difficult doing work/chores Very difficult Very difficult Somewhat difficult     Data saved with a previous flowsheet row definition    Scribe for Treatment Team: Alveta CHRISTELLA Kerns, LCSW 05/02/2024 10:34 AM

## 2024-05-02 NOTE — Group Note (Signed)
 Date:  05/02/2024 Time:  10:52 AM  Group Topic/Focus:  Healthy Communication:   The focus of this group is to discuss communication, barriers to communication, as well as healthy ways to communicate with others.    Participation Level:  Did Not Attend   [name] 05/02/2024, 10:52 AM

## 2024-05-02 NOTE — Progress Notes (Signed)
 PT Cancellation Note  Patient Details Name: [name] MRN: [id number] DOB: 06-08-1950   Cancelled Treatment:     Per OT, patient is independent with mobility. No skilled PT needs at this time. Please re-consult if this changes, Thank you.    Larry Alcock 05/02/2024, 8:40 AM

## 2024-05-03 DIAGNOSIS — F332 Major depressive disorder, recurrent severe without psychotic features: Secondary | ICD-10-CM | POA: Diagnosis not present

## 2024-05-03 NOTE — Group Note (Signed)
 Date:  05/03/2024 Time:  4:06 PM  Group Topic/Focus:  Overcoming Stress:   The focus of this group is to define stress and help patients assess their triggers.    Participation Level:  Did Not Attend   Arland Nutting 05/03/2024, 4:06 PM

## 2024-05-03 NOTE — Progress Notes (Signed)
°   05/03/24 0045  Psych Admission Type (Psych Patients Only)  Admission Status Voluntary  Psychosocial Assessment  Patient Complaints Anxiety  Eye Contact Fair  Facial Expression Anxious  Affect Appropriate to circumstance  Speech Logical/coherent  Interaction Assertive  Motor Activity Unsteady  Appearance/Hygiene In scrubs  Behavior Characteristics Cooperative  Mood Pleasant  Thought Process  Coherency WDL  Content WDL  Delusions WDL  Perception WDL  Hallucination None reported or observed  Judgment WDL  Confusion Mild  Danger to Self  Current suicidal ideation? Denies  Danger to Others  Danger to Others None reported or observed    Estimated Sleeping Duration (Last 24 Hours): 10.25-11.75 hours

## 2024-05-03 NOTE — [facility] Group Notes (Signed)
 Spirituality Group   Group Goal: Support / Education around grief and loss   Group Description: Following introductions and group rules, group members engaged in facilitated group dialog and support around topic of loss, with particular support around experiences of loss in their lives. Group members identified types of loss (relationships / self / things) as well as patterns, circumstances, and changes that precipitate loss. Reflection invited on thoughts / feelings around loss, normalized grief responses, and recognized variety in grief experience. Group noted Worden's four tasks of grief in discussion. Group drew on Adlerian / Rogerian, narrative, MI, with [place] group therapy as a primary framework.   Observations: [name] was an active participant in the group discussion. Helped foster mutual empathy among peers.  [name] HERO.Div

## 2024-05-03 NOTE — Group Note (Signed)
 Recreation Therapy Group Note   Group Topic:Emotion Expression  Group Date: 05/03/2024 Start Time: 1400 End Time: 1435 Facilitators: Celestia Jeoffrey BRAVO, LRT, CTRS Location: Dayroom  Group Description: Expressive Higher Education Careers Adviser. Patients received a blank postcard template. LRT encouraged pt to create a postcard to themselves in their younger days with the knowledge and wisdom they have today. Pts are encouraged to share something positive or words of encouragement on their post cards using colored pencils and markers. Once finished, patients and LRT had a discussion on why they chose the words they did and what it means to them.  Goal Area(s) Addressed: Patient will increase communication skills.  Patient will reminisce a fond memory in their life.   Patient will practice healthy decision making. Patient will express their emotions in a positive way.    Affect/Mood: N/A   Participation Level: Did not attend    Clinical Observations/Individualized Feedback: Patient did not attend.  Plan: Continue to engage patient in RT group sessions 2-3x/week.   Jeoffrey BRAVO Celestia, LRT, CTRS 05/03/2024 5:03 PM

## 2024-05-03 NOTE — Group Note (Signed)
 [facility] LCSW Group Therapy Note   Group Date: 05/03/2024 Start Time: 1300 End Time: 1400   Type of Therapy/Topic:  Group Therapy:  Balance in Life  Participation Level:  Did Not Attend   Description of Group:    This group will address the concept of balance and how it feels and looks when one is unbalanced. Patients will be encouraged to process areas in their lives that are out of balance, and identify reasons for remaining unbalanced. Facilitators will guide patients utilizing problem- solving interventions to address and correct the stressor making their life unbalanced. Understanding and applying boundaries will be explored and addressed for obtaining  and maintaining a balanced life. Patients will be encouraged to explore ways to assertively make their unbalanced needs known to significant others in their lives, using other group members and facilitator for support and feedback.  Therapeutic Goals: Patient will identify two or more emotions or situations they have that consume much of in their lives. Patient will identify signs/triggers that life has become out of balance:  Patient will identify two ways to set boundaries in order to achieve balance in their lives:  Patient will demonstrate ability to communicate their needs through discussion and/or role plays  Summary of Patient Progress:    Patient did not attend.     Therapeutic Modalities:   Cognitive Behavioral Therapy Solution-Focused Therapy Assertiveness Training   Charlotte CHRISTELLA Kerns, LCSW

## 2024-05-03 NOTE — Plan of Care (Signed)
   Problem: Education: Goal: Verbalization of understanding the information provided will improve Outcome: Progressing   Problem: Activity: Goal: Interest or engagement in activities will improve Outcome: Not Progressing
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
## 2024-05-03 NOTE — Progress Notes (Signed)
 SI/HI/AVH: denies all  Behavior/Mood: cooperative/pleasant    Interaction/Group attendance: assertive/ 0 of 3 groups     Medication/PRNs: compliant/ tramadol  x 2   Pain: 7-8 head and neck  Other: pt prefers to use apple juice with fiber packet

## 2024-05-03 NOTE — Progress Notes (Signed)
°   05/02/24 1355  Spiritual Encounters  Type of Visit Initial  Care provided to: Patient  Referral source Patient request  Reason for visit Routine spiritual support  OnCall Visit No  Interventions  Spiritual Care Interventions Made Established relationship of care and support;Narrative/life review;Explored values/beliefs/practices/strengths;Prayer   While rounding on unit I providedspiritual care support to [facility].  [name] asked for chance to visit one on one and debriefed with me recent changes in health as well as numerous health challenges in past 80yrs. Latest surgery to hip have caused major life transition to SNF and this has been impactful to peace of mind, ability to sleep. 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 She named her Sherlean faith as significant source of strength and meaning. She has a son in neuro-ICU and another son. She hopes to locate alternative housing.  I provided compassionate presence and both active and reflective listening. I facilitated story sharing and reflecting strengths, sources of resiliance and meaning. I invited staying in the present and ways to sit with uncertainty of situation, leaning into faith. I offered prayer at her request and invited to spirituality group.  Jacqueline Spofford L. Delores HERO.Div

## 2024-05-03 NOTE — Plan of Care (Signed)
   Problem: Education: Goal: Emotional status will improve Outcome: Progressing   Problem: Activity: Goal: Sleeping patterns will improve Outcome: Progressing

## 2024-05-04 DIAGNOSIS — F332 Major depressive disorder, recurrent severe without psychotic features: Secondary | ICD-10-CM | POA: Diagnosis not present

## 2024-05-04 DIAGNOSIS — F0390 Unspecified dementia without behavioral disturbance: Secondary | ICD-10-CM | POA: Diagnosis not present

## 2024-05-04 NOTE — Progress Notes (Signed)
" °   05/04/24 0900  Psych Admission Type (Psych Patients Only)  Admission Status Voluntary  Psychosocial Assessment  Patient Complaints None  Eye Contact Fair  Facial Expression Anxious  Affect Appropriate to circumstance  Speech Logical/coherent  Interaction Assertive  Motor Activity Unsteady  Appearance/Hygiene In scrubs  Behavior Characteristics Cooperative  Mood Pleasant  Thought Process  Coherency WDL  Content WDL  Delusions WDL  Perception WDL  Hallucination None reported or observed  Judgment WDL  Confusion WDL  Danger to Self  Current suicidal ideation? Denies  Danger to Others  Danger to Others None reported or observed    "

## 2024-05-04 NOTE — Group Note (Signed)
 Date:  05/04/2024 Time:  10:47 AM  Group Topic/Focus:  Stages of Change:   The focus of this group is to explain the stages of change and help patients identify changes they want to make upon discharge.    Participation Level:  Did Not Attend  Participation Quality:     Affect:     Cognitive:     Insight: None  Engagement in Group:     Modes of Intervention:     Additional Comments:    [name] Bias 05/04/2024, 10:47 AM
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
## 2024-05-04 NOTE — Plan of Care (Signed)
 Patient alert and oriented x4. Denies SI, HI, AVH. Pain 8/10 pt received Tylenol  . Support and encouragement provided.  Routine safety checks conducted every 15 minutes.  Patient informed to notify staff with problems or concerns. No adverse drug reactions noted. Patient verbally contracts for safety at this time. Patient interacts well with others on the unit; when noise is not disturbing, then isolates.  Patient remains safe at this time.  Problem: Education: Goal: Emotional status will improve Outcome: Progressing   Problem: Education: Goal: Mental status will improve Outcome: Progressing   Problem: Education: Goal: Verbalization of understanding the information provided will improve Outcome: Progressing   Problem: Activity: Goal: Sleeping patterns will improve Outcome: Progressing   Problem: Health Behavior/Discharge Planning: Goal: Identification of resources available to assist in meeting health care needs will improve Outcome: Progressing Goal: Compliance with treatment plan for underlying cause of condition will improve Outcome: Progressing   Problem: Safety: Goal: Periods of time without injury will increase Outcome: Progressing

## 2024-05-04 NOTE — Group Note (Signed)
 Physical/Occupational Therapy Group Note  Group Topic: Pain Management and Coping   Group Date: 05/04/2024 Start Time: 1300 End Time: 1400 Facilitators: Clive Warren CROME, OT   Group Description: Group discussed impact of chronic/acute pain on safety and independence with functional tasks and impact on mental health.  Identified and discussed any previously learned or implemented strategies used.  Discussed and reviewed cognitive behavioral pain coping strategies to address/improve overall management of pain. Discussed relaxation, distraction techniques, cognitive restructuring, activity pacing/energy conservation, environment/home safety modifications, and role of sleep and sleep hygiene. Allowed time for questions and further discussion.  Therapeutic Goal(s):  Identify and discuss previously utilized pain coping strategies and implications of pain on function/well-being Identify and discuss implementing new cognitive behavioral pain coping strategies into daily routines Demonstrate understanding and performance of learned cognitive behavioral pain coping strategies  Individual Participation: Pt did not attend.  Participation Level: Did not attend   Participation Quality:   Behavior:   Speech/Thought Process:   Affect/Mood:   Insight:   Judgement:   Modes of Intervention:   Patient Response to Interventions:    Plan: Continue to engage patient in PT/OT groups 1 - 2x/week.  Keagan Anthis R., MPH, MS, OTR/L ascom [phone] 05/04/2024, 3:15 PM

## 2024-05-04 NOTE — Group Note (Signed)
 Date:  05/04/2024 Time:  3:34 PM  Group Topic/Focus:  Movement Therapy, Getting fit with Sophonie Goforth.    Participation Level:  Did Not Attend    Norleen SHAUNNA Bias 05/04/2024, 3:34 PM

## 2024-05-04 NOTE — Group Note (Signed)
 Recreation Therapy Group Note   Group Topic:Communication  Group Date: 05/04/2024 Start Time: 1405 End Time: 1440 Facilitators: Celestia Jeoffrey BRAVO, LRT, CTRS Location: Courtyard  Group Description: Music. Patients are encouraged to name their favorite song(s) for LRT to play song through speaker for group to hear, while in the courtyard getting fresh air and sunlight. Patients educated on the definition of leisure and the importance of having different leisure interests outside of the hospital. Group discussed how leisure activities can often be used as pharmacologist and that listening to music and being outside are examples.    Goal Area(s) Addressed:  Patient will identify a current leisure interest.  Patient will practice making a positive decision. Patient will have the opportunity to try a new leisure activity.   Affect/Mood: N/A   Participation Level: Did not attend    Clinical Observations/Individualized Feedback: Patient did not attend.  Plan: Continue to engage patient in RT group sessions 2-3x/week.   Jeoffrey BRAVO Celestia, LRT, CTRS 05/04/2024 4:51 PM

## 2024-05-04 NOTE — Progress Notes (Signed)
(  Sleep Hours) - 8.75 (Any PRNs that were needed, meds refused, or side effects to meds)- Tramadol , effective (Any disturbances and when (visitation, over night)-N/A (Concerns raised by the patient)- N/A (SI/HI/AVH)- Denies

## 2024-05-04 NOTE — Plan of Care (Signed)
   Problem: Education: Goal: Emotional status will improve Outcome: Progressing Goal: Mental status will improve Outcome: Progressing   Problem: Activity: Goal: Interest or engagement in activities will improve Outcome: Progressing   Problem: Coping: Goal: Ability to verbalize frustrations and anger appropriately will improve Outcome: Progressing

## 2024-05-04 NOTE — Group Note (Signed)
 Date:  05/04/2024 Time:  10:27 PM  Group Topic/Focus:  Self Care:   The focus of this group is to help patients understand the importance of self-care in order to improve or restore emotional, physical, spiritual, interpersonal, and financial health.    Participation Level:  Active  Participation Quality:  Appropriate  Affect:  Appropriate  Cognitive:  Appropriate  Insight: Appropriate  Engagement in Group:  Engaged  Modes of Intervention:  Discussion  Additional Comments:    [name] 05/04/2024, 10:27 PM

## 2024-05-05 DIAGNOSIS — F0390 Unspecified dementia without behavioral disturbance: Secondary | ICD-10-CM | POA: Diagnosis not present

## 2024-05-05 DIAGNOSIS — F332 Major depressive disorder, recurrent severe without psychotic features: Secondary | ICD-10-CM | POA: Diagnosis not present

## 2024-05-05 MED ORDER — ONDANSETRON HCL 4 MG PO TABS
4.0000 mg | ORAL_TABLET | Freq: Four times a day (QID) | ORAL | Status: DC | PRN
  Administered 2024-05-05 – 2024-05-14 (×3): 4 mg via ORAL
  Filled 2024-05-05 (×2): qty 1

## 2024-05-05 NOTE — Group Note (Signed)
 Date:  05/05/2024 Time:  4:30 PM  Group Topic/Focus:  Managing Feelings:   The focus of this group is to identify what feelings patients have difficulty handling and develop a plan to handle them in a healthier way upon discharge.    Participation Level:  Did Not Attend   Larrie Leita BRAVO 05/05/2024, 4:30 PM

## 2024-05-05 NOTE — Group Note (Signed)
"                                                 [facility] LCSW Group Therapy Note    Group Date: 05/05/2024 Start Time: 1328 End Time: 1358  Type of Therapy and Topic:  Group Therapy:  Overcoming Obstacles  Participation Level:  [facility] PARTICIPATION LEVEL: Did Not Attend  Mood: Not able to assess ( did not attend group)  Description of Group:   In this group patients will be encouraged to explore what they see as obstacles to their own wellness and recovery. They will be guided to discuss their thoughts, feelings, and behaviors related to these obstacles. The group will process together ways to cope with barriers, with attention given to specific choices patients can make. Each patient will be challenged to identify changes they are motivated to make in order to overcome their obstacles. This group will be process-oriented, with patients participating in exploration of their own experiences as well as giving and receiving support and challenge from other group members.  Therapeutic Goals: 1. Patient will identify personal and current obstacles as they relate to admission. 2. Patient will identify barriers that currently interfere with their wellness or overcoming obstacles.  3. Patient will identify feelings, thought process and behaviors related to these barriers. 4. Patient will identify two changes they are willing to make to overcome these obstacles:    Summary of Patient Progress  The patient did not attend group.    Therapeutic Modalities:   Cognitive Behavioral Therapy Solution Focused Therapy Motivational Interviewing Relapse Prevention Therapy   Rexene LELON Mae, LCSWA "

## 2024-05-05 NOTE — Group Note (Signed)
 Date:  05/05/2024 Time:  10:20 PM  Group Topic/Focus:  Identifying Needs:   The focus of this group is to help patients identify their personal needs that have been historically problematic and identify healthy behaviors to address their needs.    Participation Level:  Active  Participation Quality:  Appropriate  Affect:  Appropriate  Cognitive:  Appropriate  Insight: Appropriate  Engagement in Group:  Engaged  Modes of Intervention:  Discussion  Additional Comments:    Laymon ONEIDA Finder 05/05/2024, 10:20 PM

## 2024-05-05 NOTE — Group Note (Signed)
 Date:  05/05/2024 Time:  10:30 AM  Group Topic/Focus:  Movement Therapy Morning strech with Lavance Beazer    Participation Level:  Did Not Attend    Norleen SHAUNNA Bias 05/05/2024, 10:30 AM

## 2024-05-05 NOTE — Plan of Care (Signed)
" °  Problem: Coping: Goal: Ability to verbalize frustrations and anger appropriately will improve Outcome: Progressing Goal: Ability to demonstrate self-control will improve Outcome: Progressing   Problem: Safety: Goal: Periods of time without injury will increase Outcome: Progressing   Problem: Education: Goal: Ability to state activities that reduce stress will improve Outcome: Progressing   Problem: Education: Goal: Utilization of techniques to improve thought processes will improve Outcome: Progressing   "

## 2024-05-05 NOTE — Progress Notes (Signed)
" °   05/04/24 2100  Psych Admission Type (Psych Patients Only)  Admission Status Voluntary  Psychosocial Assessment  Patient Complaints None  Eye Contact Fair  Facial Expression Anxious  Affect Appropriate to circumstance  Speech Logical/coherent  Interaction Isolative  Motor Activity Unsteady  Appearance/Hygiene In scrubs  Behavior Characteristics Cooperative  Mood Pleasant  Thought Process  Coherency WDL  Content WDL  Delusions None reported or observed  Perception WDL  Hallucination None reported or observed  Judgment Impaired  Confusion None  Danger to Self  Current suicidal ideation? Denies    "

## 2024-05-05 NOTE — Plan of Care (Signed)
" °  Problem: Coping: Goal: Ability to verbalize frustrations and anger appropriately will improve Outcome: Progressing Goal: Ability to demonstrate self-control will improve Outcome: Progressing   Problem: Education: Goal: Ability to state activities that reduce stress will improve Outcome: Progressing   Problem: Education: Goal: Emotional status will improve Outcome: Not Progressing   "

## 2024-05-05 NOTE — Progress Notes (Signed)
" °   05/05/24 1200  Psych Admission Type (Psych Patients Only)  Admission Status Voluntary  Psychosocial Assessment  Patient Complaints None  Eye Contact Fair  Facial Expression Animated  Affect Appropriate to circumstance  Speech Logical/coherent  Interaction Assertive  Motor Activity Unsteady  Appearance/Hygiene In scrubs  Behavior Characteristics Cooperative;Calm  Mood Pleasant  Thought Process  Coherency WDL  Content WDL  Delusions None reported or observed  Perception WDL  Hallucination None reported or observed  Judgment Impaired  Confusion None  Danger to Self  Current suicidal ideation? Denies    "

## 2024-05-06 DIAGNOSIS — F332 Major depressive disorder, recurrent severe without psychotic features: Secondary | ICD-10-CM | POA: Diagnosis not present

## 2024-05-06 DIAGNOSIS — F0390 Unspecified dementia without behavioral disturbance: Secondary | ICD-10-CM | POA: Diagnosis not present

## 2024-05-06 MED ORDER — BUSPIRONE HCL 15 MG PO TABS
30.0000 mg | ORAL_TABLET | Freq: Three times a day (TID) | ORAL | Status: DC
  Administered 2024-05-06 – 2024-05-15 (×27): 30 mg via ORAL
  Filled 2024-05-06 (×10): qty 2
  Filled 2024-05-06: qty 6
  Filled 2024-05-06 (×5): qty 2
  Filled 2024-05-06: qty 6
  Filled 2024-05-06: qty 2
  Filled 2024-05-06: qty 6
  Filled 2024-05-06 (×3): qty 2
  Filled 2024-05-06 (×3): qty 6
  Filled 2024-05-06 (×5): qty 2

## 2024-05-06 NOTE — Group Note (Signed)
 Date:  05/06/2024 Time:  10:36 AM  Group Topic/Focus:  Movement Therapy, Morning Stretch with Nikkita Adeyemi.    Participation Level:  Did Not Attend    [name] 05/06/2024, 10:36 AM

## 2024-05-06 NOTE — Progress Notes (Signed)
" °   05/06/24 1100  Psych Admission Type (Psych Patients Only)  Admission Status Voluntary  Psychosocial Assessment  Patient Complaints None  Eye Contact Fair  Facial Expression Animated  Affect Appropriate to circumstance  Speech Logical/coherent  Interaction Assertive  Motor Activity Slow  Appearance/Hygiene In scrubs  Behavior Characteristics Cooperative;Calm  Mood Pleasant  Thought Process  Coherency WDL  Content WDL  Delusions None reported or observed  Perception WDL  Hallucination None reported or observed  Judgment Impaired  Confusion None  Danger to Self  Current suicidal ideation? Denies    "

## 2024-05-06 NOTE — Plan of Care (Signed)
°  Problem: Coping: Goal: Ability to verbalize frustrations and anger appropriately will improve Outcome: Progressing Goal: Ability to demonstrate self-control will improve Outcome: Progressing   Problem: Physical Regulation: Goal: Ability to maintain clinical measurements within normal limits will improve Outcome: Progressing   Problem: Safety: Goal: Periods of time without injury will increase Outcome: Progressing

## 2024-05-06 NOTE — Group Note (Signed)
 Date:  05/06/2024 Time:  4:20 PM  Group Topic/Focus:  Wellness Toolbox:   The focus of this group is to discuss various aspects of wellness, balancing those aspects and exploring ways to increase the ability to experience wellness.  Patients will create a wellness toolbox for use upon discharge.    Participation Level:  Active  Participation Quality:  Appropriate, Attentive, and Supportive  Affect:  Appropriate and Flat  Cognitive:  Alert  Insight: Appropriate  Engagement in Group:  Engaged and Supportive  Modes of Intervention:  Activity and Discussion  Additional Comments:  Pt shared a lot of her history with past jobs and life stories etc. Pt. Was eager and excited to share about her life. Pt pleasant in conversation and appreciative of group to staff.    Maglione,Giuliano Preece E 05/06/2024, 4:20 PM

## 2024-05-06 NOTE — Group Note (Signed)
 Date:  05/06/2024 Time:  11:11 PM  Group Topic/Focus:  Wrap-Up Group:   The focus of this group is to help patients review their daily goal of treatment and discuss progress on daily workbooks.    Participation Level:  Active  Participation Quality:  Appropriate  Affect:  Appropriate  Cognitive:  Alert  Insight: Appropriate  Engagement in Group:  Engaged  Modes of Intervention:  Discussion  Additional Comments:    [name] 05/06/2024, 11:11 PM

## 2024-05-06 NOTE — Plan of Care (Signed)
  Problem: Coping: Goal: Ability to verbalize frustrations and anger appropriately will improve Outcome: Progressing Goal: Ability to demonstrate self-control will improve Outcome: Progressing   Problem: Education: Goal: Emotional status will improve Outcome: Not Progressing Goal: Mental status will improve Outcome: Not Progressing

## 2024-05-06 NOTE — Progress Notes (Signed)
" °   05/05/24 2100  Psych Admission Type (Psych Patients Only)  Admission Status Voluntary  Psychosocial Assessment  Patient Complaints None  Eye Contact Fair  Facial Expression Animated  Affect Appropriate to circumstance  Speech Logical/coherent  Interaction Assertive  Motor Activity Unsteady  Appearance/Hygiene In scrubs  Behavior Characteristics Cooperative  Mood Pleasant  Thought Process  Coherency WDL  Content WDL  Delusions None reported or observed  Perception WDL  Hallucination None reported or observed  Judgment Impaired  Confusion None  Danger to Self  Current suicidal ideation? Denies    "
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
## 2024-05-07 DIAGNOSIS — F0390 Unspecified dementia without behavioral disturbance: Secondary | ICD-10-CM | POA: Diagnosis not present

## 2024-05-07 DIAGNOSIS — F332 Major depressive disorder, recurrent severe without psychotic features: Secondary | ICD-10-CM | POA: Diagnosis not present

## 2024-05-07 NOTE — Progress Notes (Signed)
 SI/HI/AVH: denies all  Behavior/Mood: cooperative/pleasant   Interaction/Group attendance: assertive/ 3 of 4 groups    Medication/PRNs: compliant/ Tramadol  x1   Pain: 7/10 headache  Other: discharge planning ongoing
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
## 2024-05-07 NOTE — Plan of Care (Signed)
   Problem: Activity: Goal: Interest or engagement in activities will improve Outcome: Progressing Goal: Sleeping patterns will improve Outcome: Progressing

## 2024-05-07 NOTE — Group Note (Signed)
 Recreation Therapy Group Note   Group Topic:Healthy Support Systems  Group Date: 05/07/2024 Start Time: 1400 End Time: 1435 Facilitators: Celestia Jeoffrey BRAVO, LRT, CTRS Location: Dayroom  Group Description: Straw Bridge. In groups or individually, patients were given 10 plastic drinking straws and an equal length of masking tape. Using the materials provided, patients were instructed to build a free-standing bridge-like structure to suspend an everyday item (ex: deck of cards) off the floor or table surface. All materials were required to be used in secondary school teacher. LRT facilitated post-activity discussion reviewing the importance of having strong and healthy support systems in our lives. LRT discussed how the people in our lives serve as the tape and the deck of cards we placed on top of our straw structure are the stressors we face in daily life. LRT and pts discussed what happens in our life when things get too heavy for us , and we don't have strong supports outside of the hospital. Pt shared 2 of their healthy supports in their life aloud in the group.   Goal Area(s) Addressed:  Patient will identify 2 healthy supports in their life. Patient will identify skills to successfully complete activity. Patient will identify correlation of this activity to life post-discharge.  Patient will build on frustration tolerance skills. Patient will increase team building and communication skills.    Affect/Mood: Appropriate   Participation Level: Active and Engaged   Participation Quality: Independent   Behavior: Calm and Cooperative   Speech/Thought Process: Coherent   Insight: Good   Judgement: Good   Modes of Intervention: STEM Activity   Patient Response to Interventions:  Attentive, Engaged, and Receptive   Education Outcome:  Acknowledges education   Clinical Observations/Individualized Feedback: Shonte was active in their participation of session activities and group discussion. Pt  identified my faith and friends as healthy supports.    Plan: Continue to engage patient in RT group sessions 2-3x/week.   Jeoffrey BRAVO Celestia, LRT, CTRS 05/07/2024 4:40 PM

## 2024-05-07 NOTE — BH IP Treatment Plan (Signed)
 Interdisciplinary Treatment and Diagnostic Plan Update  05/07/2024 Time of Session: 2:00 PM  [name] MRN: [id number]  Principal Diagnosis: MDD (major depressive disorder), recurrent episode, severe (HCC)  Secondary Diagnoses: Active Problems:   MDD (major depressive disorder), recurrent severe, without psychosis (HCC)   Current Medications:  Current Facility-Administered Medications  Medication Dose Route Frequency Provider Last Rate Last Admin   acetaminophen  (TYLENOL ) tablet 650 mg  650 mg Oral Q6H PRN Bobbitt, Shalon E, NP   650 mg at 05/06/24 1325   ALPRAZolam  (XANAX ) tablet 1 mg  1 mg Oral TID White, Patrice L, NP   1 mg at 05/07/24 1038   ascorbic acid  (VITAMIN C ) tablet 500 mg  500 mg Oral Daily White, Patrice L, NP   500 mg at 05/07/24 1038   busPIRone  (BUSPAR ) tablet 30 mg  30 mg Oral TID Madaram, Kondal R, MD   30 mg at 05/07/24 1036   carvedilol  (COREG ) tablet 12.5 mg  12.5 mg Oral BID WC White, Patrice L, NP   12.5 mg at 05/06/24 1633   cloNIDine  (CATAPRES ) tablet 0.2 mg  0.2 mg Oral QHS White, Patrice L, NP   0.2 mg at 05/06/24 2145   colesevelam  (WELCHOL ) tablet 1,250 mg  1,250 mg Oral BID WC White, Patrice L, NP   1,250 mg at 05/07/24 1036   cyanocobalamin  (VITAMIN B12) tablet 1,000 mcg  1,000 mcg Oral Daily White, Patrice L, NP   1,000 mcg at 05/07/24 1038   ferrous sulfate  tablet 325 mg  325 mg Oral Daily White, Patrice L, NP   325 mg at 05/07/24 1038   FLUoxetine  (PROZAC ) capsule 40 mg  40 mg Oral Daily White, Patrice L, NP   40 mg at 05/07/24 1036   hydrALAZINE  (APRESOLINE ) tablet 10 mg  10 mg Oral TID White, Patrice L, NP   10 mg at 05/06/24 2200   hydrOXYzine  (ATARAX ) tablet 50 mg  50 mg Oral Q6H PRN Madaram, Kondal R, MD   50 mg at 05/06/24 1326   levothyroxine  (SYNTHROID ) tablet 100 mcg  100 mcg Oral QAC breakfast White, Patrice L, NP   100 mcg at 05/07/24 9368   loratadine  (CLARITIN ) tablet 10 mg  10 mg Oral Daily White, Patrice L, NP   10 mg at  05/07/24 1037   menthol  (CEPACOL) lozenge 3 mg  1 lozenge Oral PRN McLauchlin, Angela, NP   3 mg at 05/02/24 0026   mirtazapine  (REMERON ) tablet 30 mg  30 mg Oral QHS White, Patrice L, NP   30 mg at 05/06/24 2145   nystatin  (MYCOSTATIN /NYSTOP ) topical powder 1 Application  1 Application Topical QHS White, Patrice L, NP   1 Application at 05/06/24 2153   OLANZapine  (ZYPREXA ) injection 5 mg  5 mg Intramuscular TID PRN White, Patrice L, NP       OLANZapine  zydis (ZYPREXA ) disintegrating tablet 5 mg  5 mg Oral TID PRN White, Patrice L, NP       omega-3 acid ethyl esters (LOVAZA ) capsule 2 g  2 capsule Oral BID White, Patrice L, NP   2 g at 05/07/24 1206   ondansetron  (ZOFRAN ) tablet 4 mg  4 mg Oral Q6H PRN Madaram, Kondal R, MD   4 mg at 05/05/24 1439   pantoprazole  (PROTONIX ) EC tablet 40 mg  40 mg Oral BID White, Patrice L, NP   40 mg at 05/07/24 1038   polyethylene glycol (MIRALAX  / GLYCOLAX ) packet 17 g  17 g Oral Daily PRN Teresa Jes  L, NP       psyllium (HYDROCIL/METAMUCIL) 1 packet  1 packet Oral BID White, Patrice L, NP   1 packet at 05/07/24 1039   rosuvastatin  (CRESTOR ) tablet 10 mg  10 mg Oral Daily White, Patrice L, NP   10 mg at 05/07/24 1037   saccharomyces boulardii (FLORASTOR) capsule 250 mg  250 mg Oral BID White, Patrice L, NP   250 mg at 05/07/24 1036   traMADol  (ULTRAM ) tablet 50 mg  50 mg Oral Q6H PRN White, Patrice L, NP   50 mg at 05/07/24 9365   PTA Medications: Medications Prior to Admission  Medication Sig Dispense Refill Last Dose/Taking   acetaminophen  (TYLENOL ) 500 MG tablet Take 500 mg by mouth 2 (two) times daily.      ALPRAZolam  (XANAX ) 1 MG tablet Take 1 tablet (1 mg total) by mouth 3 (three) times daily. (Patient taking differently: Take 1 mg by mouth 3 (three) times daily. Home is only giving her twice daily instead of TID) 90 tablet 5    ascorbic acid  (VITAMIN C ) 500 MG tablet Take 1 tablet (500 mg total) by mouth daily. 30 tablet 11    aspirin  EC (ASPIRIN  LOW  DOSE) 81 MG tablet Take 1 tablet (81 mg total) by mouth daily. Swallow whole. 30 tablet 3    busPIRone  (BUSPAR ) 30 MG tablet Take 1 tablet (30 mg total) by mouth 2 (two) times daily. 60 tablet 3    carvedilol  (COREG ) 12.5 MG tablet Take 1 tablet (12.5 mg total) by mouth every evening. (Patient taking differently: Take 12.5 mg by mouth in the morning and at bedtime.) 90 tablet 3    cetirizine (ZYRTEC) 10 MG tablet Take 10 mg by mouth daily.      Cholecalciferol  (D3 5000) 125 MCG (5000 UT) capsule Take 1 capsule (5,000 Units total) by mouth daily. 30 capsule 11    cloNIDine  (CATAPRES ) 0.2 MG tablet Take 1 tablet (0.2 mg total) by mouth at bedtime. 90 tablet 3    colesevelam  (WELCHOL ) 625 MG tablet Take 2 tablets (1,250 mg total) by mouth 2 (two) times daily with a meal. 360 tablet 3    cyanocobalamin  (VITAMIN B12) 1000 MCG tablet Take 1 tablet (1,000 mcg total) by mouth daily. Take 1,000 mcg by mouth daily. 30 tablet 11    D-Mannose 500 MG CAPS Take 1 capsule (500 mg total) by mouth 2 (two) times daily. WITH CRANBERRY & DANDELION EXTRACT 60 capsule 11    diphenoxylate -atropine  (LOMOTIL ) 2.5-0.025 MG tablet Take 1 tablet by mouth 4 (four) times daily as needed for diarrhea or loose stools. 20 tablet 0    ferrous sulfate  (FEROSUL) 325 (65 FE) MG tablet Take 325 mg by mouth daily.      FLUoxetine  (PROZAC ) 40 MG capsule Take 1 capsule (40 mg total) by mouth daily. 30 capsule 11    fluticasone  (FLONASE ) 50 MCG/ACT nasal spray Place 2 sprays into both nostrils daily.      hydrALAZINE  (APRESOLINE ) 10 MG tablet Take 1 tablet by mouth 3 times daily. MAY TAKE EXTRA 10MG  FOR SBP>160 (Patient taking differently: Take 10 mg by mouth 3 (three) times daily.) 120 tablet 9    levothyroxine  (SYNTHROID ) 100 MCG tablet Take 1 tablet (100 mcg total) by mouth daily before breakfast. 30 tablet 11    loperamide  (IMODIUM  A-D) 2 MG tablet Take 1 tablet (2 mg total) by mouth 4 (four) times daily as needed for diarrhea or loose  stools. 100 tablet 11  Metamucil Fiber 2 g CHEW Chew 2 tablets by mouth in the morning and at bedtime. 120 tablet 11    mirtazapine  (REMERON ) 45 MG tablet Take 45 mg by mouth at bedtime.      nitrofurantoin , macrocrystal-monohydrate, (MACROBID ) 100 MG capsule Take 1 capsule (100 mg total) by mouth 2 (two) times daily. 10 capsule 0    nystatin  (MYCOSTATIN /NYSTOP ) powder Use every day on rash (Patient taking differently: Apply 1 Application topically at bedtime. Apply to under bilateral breast for moisture) 60 g 5    omega-3 acid ethyl esters (LOVAZA ) 1 g capsule Take 2 capsules (2 g total) by mouth 2 (two) times daily. 120 capsule 11    omeprazole  (PRILOSEC) 20 MG capsule Take 20 mg by mouth daily.      pantoprazole  (PROTONIX ) 40 MG tablet Take 40 mg by mouth 2 (two) times daily.      rosuvastatin  (CRESTOR ) 10 MG tablet Take 1 tablet (10 mg total) by mouth daily. 90 tablet 3    Saccharomyces boulardii (PROBIOTIC) 250 MG CAPS Take 250 mg by mouth 2 (two) times daily.      sodium chloride  (OCEAN) 0.65 % SOLN nasal spray Place 1 spray into both nostrils daily. 30 mL 1    traMADol  (ULTRAM ) 50 MG tablet Take 1 tablet (50 mg total) by mouth every 6 (six) hours as needed for severe pain (pain score 7-10). 120 tablet 2     Patient Stressors: Health problems    Patient Strengths: Ability for insight  Average or above average intelligence  Communication skills  General fund of knowledge  Religious Affiliation  Supportive family/friends   Treatment Modalities: Medication Management, Group therapy, Case management,  1 to 1 session with clinician, Psychoeducation, Recreational therapy.   Physician Treatment Plan for Primary Diagnosis: MDD (major depressive disorder), recurrent episode, severe (HCC) Long Term Goal(s):     Short Term Goals:    Medication Management: Evaluate patient's response, side effects, and tolerance of medication regimen.  Therapeutic Interventions: 1 to 1 sessions, Unit  Group sessions and Medication administration.  Evaluation of Outcomes: Progressing  Physician Treatment Plan for Secondary Diagnosis: Active Problems:   MDD (major depressive disorder), recurrent severe, without psychosis (HCC)  Long Term Goal(s):     Short Term Goals:       Medication Management: Evaluate patient's response, side effects, and tolerance of medication regimen.  Therapeutic Interventions: 1 to 1 sessions, Unit Group sessions and Medication administration.  Evaluation of Outcomes: Progressing   RN Treatment Plan for Primary Diagnosis: MDD (major depressive disorder), recurrent episode, severe (HCC) Long Term Goal(s): Knowledge of disease and therapeutic regimen to maintain health will improve  Short Term Goals: Ability to remain free from injury will improve, Ability to verbalize frustration and anger appropriately will improve, Ability to demonstrate self-control, Ability to participate in decision making will improve, Ability to verbalize feelings will improve, Ability to disclose and discuss suicidal ideas, Ability to identify and develop effective coping behaviors will improve, and Compliance with prescribed medications will improve  Medication Management: RN will administer medications as ordered by provider, will assess and evaluate patient's response and provide education to patient for prescribed medication. RN will report any adverse and/or side effects to prescribing provider.  Therapeutic Interventions: 1 on 1 counseling sessions, Psychoeducation, Medication administration, Evaluate responses to treatment, Monitor vital signs and CBGs as ordered, Perform/monitor CIWA, COWS, AIMS and Fall Risk screenings as ordered, Perform wound care treatments as ordered.  Evaluation of Outcomes: Progressing  LCSW Treatment Plan for Primary Diagnosis: MDD (major depressive disorder), recurrent episode, severe (HCC) Long Term Goal(s): Safe transition to appropriate next level  of care at discharge, Engage patient in therapeutic group addressing interpersonal concerns.  Short Term Goals: Engage patient in aftercare planning with referrals and resources, Increase social support, Increase ability to appropriately verbalize feelings, Increase emotional regulation, Facilitate acceptance of mental health diagnosis and concerns, Facilitate patient progression through stages of change regarding substance use diagnoses and concerns, Identify triggers associated with mental health/substance abuse issues, and Increase skills for wellness and recovery  Therapeutic Interventions: Assess for all discharge needs, 1 to 1 time with Social worker, Explore available resources and support systems, Assess for adequacy in community support network, Educate family and significant other(s) on suicide prevention, Complete Psychosocial Assessment, Interpersonal group therapy.  Evaluation of Outcomes: Progressing   Progress in Treatment: Attending groups: Yes and No. Participating in groups: Yes and No. Taking medication as prescribed: Yes. Toleration medication: Yes. Family/Significant other contact made: Yes, individual(s) contacted:  -Matther Alexa, son, ([name]) [phone] Patient understands diagnosis: Yes. Discussing patient identified problems/goals with staff: Yes. Medical problems stabilized or resolved: Yes. Denies suicidal/homicidal ideation: Yes. Issues/concerns per patient self-inventory: No. Other: None   New problem(s) identified: No, Describe:  None Update 05/07/24: No changes at this time   New Short Term/Long Term Goal(s):detox, elimination of symptoms of psychosis, medication management for mood stabilization; elimination of SI thoughts; development of comprehensive mental wellness/sobriety plan.  Update 05/07/24: No changes at this time   Patient Goals:  To be in a new place where it's not like it is now. Update 05/07/24: No changes at this time   Discharge Plan or  Barriers: CSW to assist with the development of appropriate discharge plan.  Update 05/07/24: No changes at this time   Reason for Continuation of Hospitalization: Anxiety Delusions  Suicidal ideation   Estimated Length of Stay: 1-7 days. Update 05/07/24: TBD  Last 3 [place] Suicide Severity Risk Score: Flowsheet Row Admission (Current) from 04/28/2024 in [facility] Most recent reading at 04/28/2024  6:00 PM ED from 04/27/2024 in [facility] Emergency Department at [facility] Most recent reading at 04/27/2024  7:21 PM ED from 04/27/2024 in [facility] Most recent reading at 04/27/2024  3:03 PM  C-SSRS RISK CATEGORY Low Risk No Risk Low Risk    Last PHQ 2/9 Scores:    04/27/2024    2:24 PM 03/06/2024    4:24 PM 10/31/2023   11:27 AM  Depression screen PHQ 2/9  Decreased Interest 2 2 1   Down, Depressed, Hopeless 2 2 1   PHQ - 2 Score 4 4 2   Altered sleeping 2 2 1   Tired, decreased energy 2 2 1   Change in appetite 2 2 1   Feeling bad or failure about yourself  0 1 1  Trouble concentrating 3 3 1   Moving slowly or fidgety/restless 2 3 1   Suicidal thoughts 1 1 0  PHQ-9 Score 16 18  8    Difficult doing work/chores Very difficult Very difficult Somewhat difficult     Data saved with a previous flowsheet row definition    Scribe for Treatment Team: [name], [place] 05/07/2024 2:28 PM

## 2024-05-07 NOTE — Group Note (Signed)
 Date:  05/07/2024 Time:  4:51 PM  Group Topic/Focus:  Building Self Esteem:   The Focus of this group is helping patients become aware of the effects of self-esteem on their lives, the things they and others do that enhance or undermine their self-esteem, seeing the relationship between their level of self-esteem and the choices they make and learning ways to enhance self-esteem.    Participation Level:  Active  Participation Quality:  Appropriate  Affect:  Appropriate  Cognitive:  Appropriate  Insight: Appropriate  Engagement in Group:  Engaged  Modes of Intervention:  Discussion   Arland Nutting 05/07/2024, 4:51 PM

## 2024-05-07 NOTE — Group Note (Signed)
 Physical/Occupational Therapy Group Note  Group Topic: Transfer Training   Group Date: 05/07/2024 Start Time: 1315 End Time: 1340 Facilitators: Jair Lindblad, Alm Hamilton, PT   Group Description: Group educated on sequence and techniques to maximize safety with functional transfers.  Additionally, integrated education on impact of seating surfaces, use of assistive device and management of orthostasis with movement transitions.  Patients actively engaged with functional transfers (sit/stand) from various seating surfaces, with and without assist devices, working to integrate and retain education provided during session.  Allowed time for questions and further discussion on mobility concerns/needs.   Therapeutic Goal(s):  Identify and demonstrate safe technique for sit/stand transfers from various seating surfaces. Identify and demonstrate safe use of assistive devices with basic transfers and simple mobility. Identify and demonstrate ability to recognize signs/symptoms of orthostasis and appropriate compensatory/safety techniques.  Individual Participation: Did not attend   Participation Level:   Participation Quality:   Behavior:   Speech/Thought Process:   Affect/Mood:   Insight:   Judgement:   Individualization:   Modes of Intervention:   Patient Response to Interventions:    Plan: Continue to engage patient in PT/OT groups 1 - 2x/week.  CHARM Hamilton Bertin PT, DPT 05/07/2024, 1:53 PM

## 2024-05-07 NOTE — Group Note (Signed)
 Date:  05/07/2024 Time:  2:10 PM  Group Topic/Focus:  Healthy Communication:   The focus of this group is to discuss communication, barriers to communication, as well as healthy ways to communicate with others. Making Healthy Choices:   The focus of this group is to help patients identify negative/unhealthy choices they were using prior to admission and identify positive/healthier coping strategies to replace them upon discharge. Managing Feelings:   The focus of this group is to identify what feelings patients have difficulty handling and develop a plan to handle them in a healthier way upon discharge. Wellness Toolbox:   The focus of this group is to discuss various aspects of wellness, balancing those aspects and exploring ways to increase the ability to experience wellness.  Patients will create a wellness toolbox for use upon discharge.  The group completed worksheets focused on expressing how each patient was feeling, followed by a positive Would You Rather activity. Patients also participated in a Complete the Sentence exercise, a simple crossword, and brain teasers to encourage interaction and engagement. The group discussed healthy ways to cope with daily stressors and practiced sharing coping strategies.  Participation Level:  Active  Participation Quality:  Appropriate  Affect:  Appropriate  Cognitive:  Appropriate  Insight: Appropriate  Engagement in Group:  Engaged  Modes of Intervention:  Activity and Discussion  Additional Comments:    Gerrick Ray L Barry Faircloth 05/07/2024, 2:10 PM

## 2024-05-07 NOTE — Plan of Care (Signed)
" °  Problem: Activity: Goal: Sleeping patterns will improve Outcome: Progressing   Problem: Health Behavior/Discharge Planning: Goal: Compliance with treatment plan for underlying cause of condition will improve Outcome: Progressing   Problem: Safety: Goal: Periods of time without injury will increase Outcome: Progressing   Problem: Education: Goal: Ability to state activities that reduce stress will improve Outcome: Progressing   "

## 2024-05-08 ENCOUNTER — Ambulatory Visit: Admitting: Psychology

## 2024-05-08 DIAGNOSIS — F332 Major depressive disorder, recurrent severe without psychotic features: Secondary | ICD-10-CM | POA: Diagnosis not present

## 2024-05-08 DIAGNOSIS — F0390 Unspecified dementia without behavioral disturbance: Secondary | ICD-10-CM | POA: Diagnosis not present

## 2024-05-08 NOTE — Group Note (Signed)
 LCSW Group Therapy Note   Group Date: 05/08/2024 Start Time: 1300 End Time: 1330   Type of Therapy and Topic:  Group Therapy: Challenging Core Beliefs  Participation Level:  None  Description of Group:  Patients were educated about core beliefs and asked to identify one harmful core belief that they have. Patients were asked to explore from where those beliefs originate. Patients were asked to discuss how those beliefs make them feel and the resulting behaviors of those beliefs. They were then be asked if those beliefs are true and, if so, what evidence they have to support them. Lastly, group members were challenged to replace those negative core beliefs with helpful beliefs.   Therapeutic Goals:   1. Patient will identify harmful core beliefs and explore the origins of such beliefs. 2. Patient will identify feelings and behaviors that result from those core beliefs. 3. Patient will discuss whether such beliefs are true. 4.  Patient will replace harmful core beliefs with helpful ones.  Summary of Patient Progress:  Pt slept in geri-chair throughout group  Therapeutic Modalities: Cognitive Behavioral Therapy; Solution-Focused Therapy   Dao Mearns D Laine Giovanetti, [place] 05/08/2024  2:04 PM

## 2024-05-08 NOTE — [facility] Counselor (Signed)
 CSW contacted pt's son [name] with provider per provider's request.   CSW unable to reach, left HIPAA compliant VM requesting return call  Lum Croft, MSW, [facility] 05/08/2024 11:25 AM
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
## 2024-05-08 NOTE — Plan of Care (Signed)
   Problem: Education: Goal: Emotional status will improve Outcome: Progressing   Problem: Activity: Goal: Interest or engagement in activities will improve Outcome: Progressing

## 2024-05-08 NOTE — Progress Notes (Unsigned)
 "                                                                                                                                                 [name] is a 73 y.o. 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  Symptoms A diagnosis of an acute, serious illness that is life-threatening. (Status: maintained) -- No Description Entered  Diminished interest in or enjoyment of activities. (Status: maintained) -- No Description Entered  Lack of energy. (Status: maintained) -- No Description Entered  Sad affect, social withdrawal, anxiety, loss of interest in activities, and low energy. (Status: maintained) -- No Description Entered  Medication Status compliance  Safety unspecified If Suicidal or Homicidal State Action Taken: unspecified  Current Risk:  Medications Buspar  (Dosage: 30mg  2X/day)  New Medication (Dosage: 250mg  2x/day)  New Medication (Dosage: 250mg  2x/day)  Prozac  (Dosage: 40mg  daily)  Remeron  (Dosage: 30mg  nightly)  xanax  (Dosage: .5mg  2-3X/day)  Objectives Related Problem: Recognize, accept, and cope with feelings of depression. Description: Identify and replace thoughts and beliefs that support depression. Target Date: 2025-04-28 Frequency: Daily Modality: individual Progress: 90%  Related Problem: Recognize, accept, and cope with feelings of depression. Description: Verbalize an understanding of healthy and unhealthy emotions with the intent of increasing the use of healthy emotions to guide actions. Target Date: 2025-04-28 Frequency: Daily Modality: individual Progress: 85%  Related Problem: Recognize, accept, and cope with feelings of depression. Description: Increasingly verbalize hopeful and positive statements regarding self,  others, and the future. Target Date: 2022-04-28 Frequency: Daily Modality: individual Progress: 100%  Related Problem: Reduce fear, anxiety, and worry associated with the medical condition. Description: Share with significant others efforts to adapt successfully to the medical condition/diagnosis. Target Date: 2025-04-28 Frequency: Daily Modality: individual Progress: 85%  Related Problem: Reduce fear, anxiety, and worry associated with the medical condition. Description: Engage in social, productive, and recreational activities that are possible in spite of medical condition. Target Date: 2025-04-28 Frequency: Daily Modality: individual Progress: 90%  Related Problem: Reduce fear, anxiety, and worry associated with the medical condition. Description: Identify the coping skills and sources of emotional support that have been beneficial in the past. Target Date: 2025-04-28 Frequency: Daily Modality: individual Progress: 95%  Client Response full compliance  Service Location Location, [address] [place], [place] [address]  Service Code cpt [phone]  Related past to present  Facilitate problem solving  Identify/label emotions  Validate/empathize  Self care activities  Lifestyle change (exercise, nutrition)  Assess/facilitate readiness to change  Relaxation training  Rationally challenge thoughts or beliefs/cognitive restructuring  Comments  Dx: F43.21  Meds: Prozac , Buspar  and Remeron .  Goals: Following the failure of a third marriage, patient is struggling to rebuild her life and adjust to her new circumstances. She suffers a negative self-concept  and has little confidence in her judgement. She is seeking to reduce symptoms of anxiety and self-doubt and to re-define satisfa ction moving forward. Therapy will focus on cognitive restructuring to redefine her experiences and focus on those satisfying aspects of her life. Will target this goal completion for April1, 2021  (completed). She is also wanting to utilize counseling to adjust and accept her latest diagnosis of cancer. She wants assistance to better manage this news and remain positive throughout treatment. Goal date is 12-22. Patient has made progress, but her medical condition is variable and she requests additional treatment to manage moods throughout her medical ordeal. In addition, she is trying to manage feelings associated with her older son's mental health struggles. Revised goal date is 12-26.   Patient agrees to a video session and is aware of the limitations of this platform. She is at home and I am in home office.   [name]:                                                                                                                                                                                                                                                                                                                                               [name] Time: 2:15p-3:00p 45 minutes                                                "

## 2024-05-08 NOTE — Progress Notes (Signed)
" °   05/08/24 0002  Psych Admission Type (Psych Patients Only)  Admission Status Voluntary  Psychosocial Assessment  Patient Complaints Anxiety;Worrying  Eye Contact Fair  Facial Expression Animated  Affect Appropriate to circumstance  Speech Logical/coherent  Interaction Assertive  Motor Activity Slow  Appearance/Hygiene In scrubs  Behavior Characteristics Cooperative;Appropriate to situation  Mood Pleasant  Thought Process  Coherency WDL  Content WDL  Delusions None reported or observed  Perception WDL  Hallucination None reported or observed  Judgment WDL  Confusion None  Danger to Self  Current suicidal ideation? Denies  Danger to Others  Danger to Others None reported or observed     Estimated Sleeping Duration (Last 24 Hours): 6.75-9.75 hours  "

## 2024-05-08 NOTE — Group Note (Signed)
 Date:  05/08/2024 Time:  4:26 PM  Group Topic/Focus:    Coloring offers geriatric patients significant benefits, including reducing stress and anxiety, improving fine motor skills (dexterity, hand-eye coordination), boosting cognitive function (focus, memory), providing creative self-expression, fighting boredom, and encouraging social interaction, all while fostering a sense of accomplishment and combating loneliness, especially for those with dementia or limited mobility.  Coloring induces a meditative state, channeling focus and interrupting negative thought patterns, which lowers anxiety and calms the mind.It can boost serotonin levels, leading to better moods, and help seniors forget aches and pains temporarily.  Participation Level:  Did Not Attend   Harlene LITTIE Gavel 05/08/2024, 4:26 PM

## 2024-05-08 NOTE — Group Note (Signed)
 Date:  05/08/2024 Time:  2:48 PM  Group Topic/Focus:  Early Warning Signs:   The focus of this group is to help patients identify signs or symptoms they exhibit before slipping into an unhealthy state or crisis.    Participation Level:  Did Not Attend   [name] 05/08/2024, 2:48 PM

## 2024-05-08 NOTE — Progress Notes (Signed)
 SI/HI/AVH: denies all   Behavior/Mood: cooperative/pleasant    Interaction/Group attendance: assertive/ 0 of 4 groups  - due to headache and nausea   Medication/PRNs: compliant/ Tramadol  x2, zofran  x1, tylenol  x1   Pain: 7/10 headache   Other: discharge planning ongoing

## 2024-05-08 NOTE — Group Note (Signed)
 Recreation Therapy Group Note   Group Topic:Leisure Education  Group Date: 05/08/2024 Start Time: 1400 End Time: 1450 Facilitators: Celestia Jeoffrey BRAVO, LRT, CTRS Location: Courtyard  Group Description: Music. Patients are encouraged to name their favorite song(s) for LRT to play song through speaker for group to hear, while in the courtyard getting fresh air and sunlight. Patients educated on the definition of leisure and the importance of having different leisure interests outside of the hospital. Group discussed how leisure activities can often be used as pharmacologist and that listening to music and being outside are examples.    Goal Area(s) Addressed:  Patient will identify a current leisure interest.  Patient will practice making a positive decision. Patient will have the opportunity to try a new leisure activity.   Affect/Mood: N/A   Participation Level: Did not attend    Clinical Observations/Individualized Feedback: Patient did not attend.  Plan: Continue to engage patient in RT group sessions 2-3x/week.   Jeoffrey BRAVO Celestia, LRT, CTRS 05/08/2024 4:29 PM

## 2024-05-08 NOTE — Group Note (Signed)
 Date:  05/08/2024 Time:  8:36 PM  Group Topic/Focus:  Wrap-Up Group:   The focus of this group is to help patients review their daily goal of treatment and discuss progress on daily workbooks.    Participation Level:  Active  Participation Quality:  Appropriate  Affect:  Appropriate  Cognitive:  Appropriate  Insight: Appropriate  Engagement in Group:  Engaged  Modes of Intervention:  Discussion  Additional Comments:    [name] 05/08/2024, 8:36 PM

## 2024-05-08 NOTE — Plan of Care (Signed)
  Problem: Education: Goal: Ability to state activities that reduce stress will improve Outcome: Progressing   Problem: Education: Goal: Utilization of techniques to improve thought processes will improve Outcome: Progressing   

## 2024-05-09 DIAGNOSIS — F0390 Unspecified dementia without behavioral disturbance: Secondary | ICD-10-CM | POA: Diagnosis not present

## 2024-05-09 DIAGNOSIS — F332 Major depressive disorder, recurrent severe without psychotic features: Secondary | ICD-10-CM | POA: Diagnosis not present

## 2024-05-09 NOTE — Plan of Care (Signed)
" °  Problem: Activity: Goal: Sleeping patterns will improve Outcome: Progressing   Problem: Education: Goal: Ability to state activities that reduce stress will improve Outcome: Progressing   "

## 2024-05-09 NOTE — Group Note (Signed)
 Date:  05/09/2024 Time:  3:25 PM  Group Topic/Focus:  Goals Group:   The focus of this group is to help patients establish daily goals to achieve during treatment and discuss how the patient can incorporate goal setting into their daily lives to aide in recovery. Managing Feelings:   The focus of this group is to identify what feelings patients have difficulty handling and develop a plan to handle them in a healthier way upon discharge.    Participation Level:  Active  Participation Quality:  Appropriate and Attentive  Affect:  Appropriate  Cognitive:  Alert  Insight: Appropriate  Engagement in Group:  Engaged  Modes of Intervention:  Activity  Additional Comments:   COURTYARD AND HOT COCO   Maglione,Donnabelle Blanchard E 05/09/2024, 3:25 PM

## 2024-05-09 NOTE — Group Note (Signed)
 Date:  05/09/2024 Time:  1:58 PM  Group Topic/Focus:  Self Care:   The focus of this group is to help patients understand the importance of self-care in order to improve or restore emotional, physical, spiritual, interpersonal, and financial health.    Participation Level:  Active  Participation Quality:  Appropriate and Attentive  Affect:  Appropriate  Cognitive:  Alert  Insight: Appropriate  Engagement in Group:  Engaged  Modes of Intervention:  Discussion, Orientation, and Socialization  Additional Comments:  Christmas party    Maglione,Samaj Wessells E 05/09/2024, 1:58 PM
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
## 2024-05-09 NOTE — BHH Counselor (Signed)
 CSW and provider contacted pt's son, [name] to discuss discharge date. CSW unable to reach, left HIPAA compliant VM requesting return call.   Lum Croft, MSW, [place] 05/09/2024 10:31 AM

## 2024-05-09 NOTE — Group Note (Signed)
 Date:  05/09/2024 Time:  9:25 PM  Group Topic/Focus:  Wrap-Up Group:   The focus of this group is to help patients review their daily goal of treatment and discuss progress on daily workbooks.    Participation Level:  Active  Participation Quality:  Appropriate  Affect:  Appropriate  Cognitive:  Appropriate  Insight: Appropriate  Engagement in Group:  Engaged  Modes of Intervention:  Discussion  Additional Comments:    [name] 05/09/2024, 9:25 PM

## 2024-05-09 NOTE — Progress Notes (Signed)
" °   05/09/24 1600  Psych Admission Type (Psych Patients Only)  Admission Status Voluntary  Psychosocial Assessment  Patient Complaints Worrying  Eye Contact Fair  Facial Expression Other (Comment)  Affect Appropriate to circumstance  Speech Logical/coherent  Interaction Assertive  Motor Activity Slow  Appearance/Hygiene In scrubs  Behavior Characteristics Cooperative  Mood Pleasant  Thought Process  Coherency WDL  Content WDL  Delusions None reported or observed  Perception WDL  Hallucination None reported or observed  Judgment WDL  Confusion None  Danger to Self  Current suicidal ideation? Denies  Danger to Others  Danger to Others None reported or observed    "

## 2024-05-09 NOTE — Group Note (Signed)
 Recreation Therapy Group Note   Group Topic:Communication  Group Date: 05/09/2024 Start Time: 1250 End Time: 1325 Facilitators: Celestia Jeoffrey BRAVO, LRT, CTRS Location: Dayroom  Group Description: LRT and NT, Leita, passed out large Christmas bags filled with 4-6 individually wrapped presents inside for the patient to unwrap. Christmas music was being played in the background. LRT, NT, and patients discussed past Christmas traditions and fun memories they have made while communicating with one another in the dayroom.   Goal Area(s) addressed: Patient will increase communication.  Patient will reminisce on past memory Patient will practice gratitude.    Affect/Mood: Appropriate   Participation Level: Active and Engaged   Participation Quality: Independent   Behavior: Calm and Cooperative   Speech/Thought Process: Coherent   Insight: Fair   Judgement: Fair    Modes of Intervention: Activity, Music, and Open Conversation   Patient Response to Interventions:  Attentive, Engaged, and Receptive   Education Outcome:  Acknowledges education   Clinical Observations/Individualized Feedback: Andersen was active in their participation of session activities and group discussion. Pt interacted well with LRT and peers duration of session.    Plan: Continue to engage patient in RT group sessions 2-3x/week.   [address] LRT, CTRS 05/09/2024 1:38 PM

## 2024-05-09 NOTE — Progress Notes (Signed)
" °   05/09/24 1010  Spiritual Encounters  Type of Visit Follow up  Care provided to: Patient  Referral source Chaplain assessment  Reason for visit Routine spiritual support  OnCall Visit No   I provided follow up spiritual care support to [name] who remembered me from group and our one on one encounter last week. She expressed a hopeful outlook and (except for last night) has mostly had better rest and feels some contentment while receiving care at [facility]. This helps with uncertainty of what she hopes will be a new SNF option.  I provided compassionate presence and relational support. I affirmed her faith as an ongoing source of strength and meaning. I offered words of encouragement and blessings for Christmas.  Hayward Rylander L. Delores HERO.Div "

## 2024-05-09 NOTE — [facility] Counselor (Incomplete)
 CSW spoke with pt regarding her discharge plan per her request.

## 2024-05-10 DIAGNOSIS — F332 Major depressive disorder, recurrent severe without psychotic features: Secondary | ICD-10-CM | POA: Diagnosis not present

## 2024-05-10 DIAGNOSIS — F0390 Unspecified dementia without behavioral disturbance: Secondary | ICD-10-CM | POA: Diagnosis not present

## 2024-05-10 LAB — URINALYSIS, ROUTINE W REFLEX MICROSCOPIC
Bilirubin Urine: NEGATIVE
Glucose, UA: NEGATIVE mg/dL
Hgb urine dipstick: NEGATIVE
Ketones, ur: NEGATIVE mg/dL
Leukocytes,Ua: NEGATIVE
Nitrite: NEGATIVE
Protein, ur: NEGATIVE mg/dL
Specific Gravity, Urine: 1.004 — ABNORMAL LOW (ref 1.005–1.030)
pH: 6 (ref 5.0–8.0)

## 2024-05-10 NOTE — Plan of Care (Signed)
" °  Problem: Coping: Goal: Ability to verbalize frustrations and anger appropriately will improve Outcome: Progressing Goal: Ability to demonstrate self-control will improve Outcome: Progressing   Problem: Safety: Goal: Periods of time without injury will increase Outcome: Progressing   Problem: Education: Goal: Ability to state activities that reduce stress will improve Outcome: Progressing   Problem: Education: Goal: Utilization of techniques to improve thought processes will improve Outcome: Progressing   "

## 2024-05-10 NOTE — Progress Notes (Signed)
 Victoria, NP returned call and placed new order for urinalysis-clean catch.

## 2024-05-10 NOTE — Plan of Care (Signed)
" °  Problem: Coping: Goal: Ability to verbalize frustrations and anger appropriately will improve Outcome: Progressing Goal: Ability to demonstrate self-control will improve Outcome: Progressing   Problem: Activity: Goal: Interest or engagement in activities will improve Outcome: Not Progressing Goal: Sleeping patterns will improve Outcome: Not Progressing   "

## 2024-05-10 NOTE — Progress Notes (Signed)
 Urine obtained and sent to lab.

## 2024-05-10 NOTE — Group Note (Signed)
 [facility] LCSW Group Therapy Note   Group Date: 05/10/2024 Start Time: 1300 End Time: 1400  Type of Therapy/Topic:  Group Therapy:  Feelings about Diagnosis  Participation Level:  Did Not Attend   Mood: Patient did not attend.    Description of Group:    This group will allow patients to explore their thoughts and feelings about diagnoses they have received. Patients will be guided to explore their level of understanding and acceptance of these diagnoses. Facilitator will encourage patients to process their thoughts and feelings about the reactions of others to their diagnosis, and will guide patients in identifying ways to discuss their diagnosis with significant others in their lives. This group will be process-oriented, with patients participating in exploration of their own experiences as well as giving and receiving support and challenge from other group members.   Therapeutic Goals: 1. Patient will demonstrate understanding of diagnosis as evidence by identifying two or more symptoms of the disorder:  2. Patient will be able to express two feelings regarding the diagnosis 3. Patient will demonstrate ability to communicate their needs through discussion and/or role plays  Summary of Patient Progress:    Patient did not attend.     Therapeutic Modalities:   Cognitive Behavioral Therapy Brief Therapy Feelings Identification    Charlotte CHRISTELLA Kerns, LCSW

## 2024-05-10 NOTE — Group Note (Signed)
 Date:  05/10/2024 Time:  8:31 PM  Group Topic/Focus:  Goals Group:   The focus of this group is to help patients establish daily goals to achieve during treatment and discuss how the patient can incorporate goal setting into their daily lives to aide in recovery. Personal Choices and Values:   The focus of this group is to help patients assess and explore the importance of values in their lives, how their values affect their decisions, how they express their values and what opposes their expression. Spirituality:   The focus of this group is to discuss how one's spirituality can aide in recovery.    Participation Level:  Did Not Attend  Participation Quality:  Pt Did Not Attend  Affect:  Pt Did Not Attend  Cognitive:  Pt Did Not Attend  Insight: Pt Did Not Attend  Engagement in Group:  Pt Did Not Attend  Modes of Intervention:  Pt Did Not Attend  Additional Comments:  Pt Did Not Attend  Lenardo Westwood E Jayveion Stalling 05/10/2024, 8:31 PM

## 2024-05-10 NOTE — Progress Notes (Signed)
 Pt c/o burning sensation in the bladder. Reports dark, cloudy urine. H/O Bladder CA. States she recently completed Marcobid 100 mg, last dose 04/30/24. [place] and victoria, NP notified of pt's complaints, recent ABT use, and request for UA R/O UTI. Awaiting call back. Tylenol  650 mg po given for pain/discomfort. POC.

## 2024-05-10 NOTE — Group Note (Signed)
 Date:  05/10/2024 Time:  10:35 AM  Group Topic/Focus:  Christmas Movie     Participation Level:  Did Not Attend    Norleen SHAUNNA Bias 05/10/2024, 10:35 AM

## 2024-05-10 NOTE — Group Note (Signed)
 Date:  05/10/2024 Time:  3:38 PM  Group Topic/Focus:  Self Care:   The focus of this group is to help patients understand the importance of self-care in order to improve or restore emotional, physical, spiritual, interpersonal, and financial health.  Meditation offers significant benefits for geriatric patients, including boosting cognitive health (memory, focus), improving emotional well-being (reducing stress, anxiety, depression, loneliness), managing chronic pain, lowering blood pressure, strengthening the immune system, and enhancing physical balance and flexibility through gentle movements like Tai Chi, all while supporting brain plasticity and overall quality of life as they age.   Participation Level:  Did Not Attend   [name] Gavel 05/10/2024, 3:38 PM
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
## 2024-05-10 NOTE — Progress Notes (Signed)
" °   05/09/24 2100  Psych Admission Type (Psych Patients Only)  Admission Status Voluntary  Psychosocial Assessment  Patient Complaints Anxiety  Eye Contact Fair  Facial Expression Worried  Affect Appropriate to circumstance  Speech Logical/coherent  Interaction Assertive  Motor Activity Tremors  Appearance/Hygiene In scrubs  Behavior Characteristics Cooperative  Mood Anxious  Thought Process  Coherency WDL  Content Blaming others  Delusions None reported or observed  Perception WDL  Hallucination None reported or observed  Judgment Impaired  Confusion None  Danger to Self  Current suicidal ideation? Denies    "

## 2024-05-10 NOTE — Progress Notes (Signed)
 Pt resting quietly in bed with eyes closed. RR even and unlabored. UA results shows colorless urine; sp gravity 1.004. Ketones and leukocytes negative. Q 15 min checks maintained for safety. No s/s of acute distress noted. No c/o pain/discomfort noted.

## 2024-05-10 NOTE — Progress Notes (Signed)
" °   05/10/24 1100  Psych Admission Type (Psych Patients Only)  Admission Status Voluntary  Psychosocial Assessment  Patient Complaints Sadness;Worrying  Eye Contact Fair  Facial Expression Animated  Affect Appropriate to circumstance  Speech Logical/coherent  Interaction Assertive  Motor Activity Slow  Appearance/Hygiene In scrubs  Behavior Characteristics Cooperative  Mood Pleasant;Sad  Thought Process  Coherency WDL  Content WDL  Delusions None reported or observed  Perception WDL  Hallucination None reported or observed  Judgment Impaired  Confusion WDL  Danger to Self  Current suicidal ideation? Denies    "

## 2024-05-11 DIAGNOSIS — F332 Major depressive disorder, recurrent severe without psychotic features: Secondary | ICD-10-CM | POA: Diagnosis not present

## 2024-05-11 DIAGNOSIS — F0390 Unspecified dementia without behavioral disturbance: Secondary | ICD-10-CM | POA: Diagnosis not present

## 2024-05-11 NOTE — Group Note (Signed)
 Date:  05/11/2024 Time:  3:45 PM  Group Topic/Focus:  Meditation Therapy    Participation Level:  Active  Participation Quality:  Appropriate  Affect:  Appropriate  Cognitive:  Appropriate  Insight: Appropriate  Engagement in Group:  Engaged  Modes of Intervention:  Activity  Additional Comments:  none  Norleen SHAUNNA Bias 05/11/2024, 3:45 PM

## 2024-05-11 NOTE — Plan of Care (Signed)
" °  Problem: Health Behavior/Discharge Planning: Goal: Identification of resources available to assist in meeting health care needs will improve 05/11/2024 0618 by Birdena Glee DASEN, RN Outcome: Progressing 05/11/2024 0617 by Birdena Glee DASEN, RN Outcome: Progressing Goal: Compliance with treatment plan for underlying cause of condition will improve 05/11/2024 0618 by Birdena Glee DASEN, RN Outcome: Progressing 05/11/2024 0617 by Kelly-Savage, Emidio Warrell T, RN Outcome: Progressing   Problem: Safety: Goal: Periods of time without injury will increase 05/11/2024 0618 by Birdena Glee DASEN, RN Outcome: Progressing 05/11/2024 0617 by Kelly-Savage, Cantrell Larouche T, RN Outcome: Progressing   Problem: Safety: Goal: Periods of time without injury will increase 05/11/2024 0618 by Birdena Glee DASEN, RN Outcome: Progressing 05/11/2024 0617 by Birdena Glee DASEN, RN Outcome: Progressing   "

## 2024-05-11 NOTE — Group Note (Signed)
 Date:  05/11/2024 Time:  10:34 AM  Group Topic/Focus:  Movement Therapy, Morning Stretch with Nevah Dalal.    Participation Level:  Did Not Attend    [name] 05/11/2024, 10:34 AM

## 2024-05-11 NOTE — BHH Counselor (Signed)
 CSW received call from [employer] with [facility].  He reports that they received a message that CSW wanted a wellness check on the pt.  CSW exlained that wellness check was for the patients son.  Officer Blinda reports that the patient's son was not home, however, he spoke with the patients sons father.    He reports that per the pts sons father the son works as an Insurance Underwriter in [place] who works 6AM to [employer].  He reports that per the son's father the patient should just be sent to the nursing home and to speak with the son was not necessary.    CSW provided [employer] with the contact information for this CSW and for the nurses station for the son to call.  Officer Blinda confirmed that the information was given to the son's father.   CSW later received call from the pt's sons father (pt's ex-husband) who confirmed the information above.  He reports that the patient is to return to the nursing home.  He reports that the son worked too hard to get her there and she might not like it but needs to leave the poor boy alone.  He reports that that son will be off on 05/12/2024 and that CSW should call after 10AM.  He reports that the son will not provide transportation but that the nursing home should pick up the patient.  He reports that pt lives at [facility] in [place].  Sherryle Margo, MSW, LCSW 05/11/2024 5:28 PM
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
## 2024-05-11 NOTE — BHH Counselor (Signed)
 CSW attempted to speak with the following [name], son, ([name]) [phone] or India Nimrod, uncle, [phone], Bruna Nimrod, aunt, [phone].  All calls went unanswered and CSW left HIPAA compliant voicemails requesting a return call.   CSW attempted to contact Molly Doggett, whom patient identified as her other POA.  CSW left HIPAA compliant voicemail for her as well.  CSW inquired if she could reach out to the patient's other identified son, and patient informed that he has been MIA for the past 3 years.    CSW asked for address for [name]; Pt provided the following [address] [place] [address] .  CSW contacted non emergency number for [facility] and requested a wellness check.  Sherryle Margo, MSW, LCSW 05/11/2024 3:50 PM

## 2024-05-11 NOTE — Progress Notes (Signed)
(  Sleep Hours) - 7.00 (Any PRNs that were needed, meds refused, or side effects to meds)- Tramadol : effective (Any disturbances and when (visitation, over night)-None reported/observed (Concerns raised by the patient)- None reported/observed (SI/HI/AVH)-Denies

## 2024-05-11 NOTE — Group Note (Deleted)
 Date:  05/11/2024 Time:  8:47 PM  Group Topic/Focus:  Overcoming Stress:   The focus of this group is to define stress and help patients assess their triggers.     Participation Level:  {BHH PARTICIPATION OZCZO:77735}  Participation Quality:  {BHH PARTICIPATION QUALITY:22265}  Affect:  {BHH AFFECT:22266}  Cognitive:  {BHH COGNITIVE:22267}  Insight: {BHH Insight2:20797}  Engagement in Group:  {BHH ENGAGEMENT IN HMNLE:77731}  Modes of Intervention:  {BHH MODES OF INTERVENTION:22269}  Additional Comments:  [name] [name] [name] [name] 05/11/2024, 8:47 PM

## 2024-05-11 NOTE — Plan of Care (Signed)
" °  Problem: Education: Goal: Emotional status will improve Outcome: Progressing   Problem: Education: Goal: Mental status will improve Outcome: Progressing   Problem: Education: Goal: Verbalization of understanding the information provided will improve Outcome: Progressing   Problem: Health Behavior/Discharge Planning: Goal: Identification of resources available to assist in meeting health care needs will improve Outcome: Progressing Goal: Compliance with treatment plan for underlying cause of condition will improve Outcome: Progressing   Problem: Safety: Goal: Periods of time without injury will increase Outcome: Progressing   "
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
## 2024-05-11 NOTE — Group Note (Signed)
 Date:  05/11/2024 Time:  9:27 PM  Group Topic/Focus:  Wrap-Up Group:   The focus of this group is to help patients review their daily goal of treatment and discuss progress on daily workbooks.    Participation Level:  Active  Participation Quality:  Appropriate  Affect:  Appropriate  Cognitive:  Appropriate  Insight: Appropriate  Engagement in Group:  Engaged  Modes of Intervention:  Discussion  Additional Comments:    [name] 05/11/2024, 9:27 PM

## 2024-05-12 NOTE — Group Note (Signed)
 Date:  05/12/2024 Time:  8:41 PM  Group Topic/Focus:  Making Healthy Choices:   The focus of this group is to help patients identify negative/unhealthy choices they were using prior to admission and identify positive/healthier coping strategies to replace them upon discharge.    Participation Level:  Active  Participation Quality:  Appropriate  Affect:  Appropriate  Cognitive:  Appropriate  Insight: Good  Engagement in Group:  Engaged  Modes of Intervention:  Discussion  Additional Comments:    [name] 05/12/2024, 8:41 PM

## 2024-05-12 NOTE — [facility] Counselor (Signed)
 CSW spoke with the patient's son [name], [facility]) to go over the SPE (Suicide Prevention Information) with him.  Patient's son confirmed that the patient will return to [facility] in [place], [place].  The address for [facility] is [address].  The patient's son [name] stated  I will not be able to be contacted for the next 10 days due to work.

## 2024-05-12 NOTE — Progress Notes (Signed)
 SI/HI: Denies Behavior/Mood: Alert and oriented. Flat affect. Endorses anxiety 4/10 related to lack of access to her cell phone, TV and other electronic devices. States she's been reading a lot and requested a dictionary to look up words. Dictionary provided by staff. No further issues or concerns noted at this time.  Interaction/Mood: interacting with peers and staff. Attends group.  Medications/PRNs:po med compliant. PRN given for pain. Pain: Ultram  50mg  po given for neck pain 7/10 at 2120 Other: slept 11.25 hours  05/11/24 2100  Psych Admission Type (Psych Patients Only)  Admission Status Voluntary  Psychosocial Assessment  Patient Complaints Anxiety  Eye Contact Fair  Facial Expression Animated  Affect Appropriate to circumstance  Speech Logical/coherent  Interaction Assertive  Motor Activity Slow  Appearance/Hygiene In scrubs  Behavior Characteristics Cooperative  Mood Pleasant  Thought Process  Coherency WDL  Content WDL  Delusions None reported or observed  Perception WDL  Hallucination None reported or observed  Judgment Impaired  Confusion None  Danger to Self  Current suicidal ideation? Denies
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
## 2024-05-12 NOTE — Plan of Care (Signed)
" °  Problem: Coping: Goal: Ability to verbalize frustrations and anger appropriately will improve Outcome: Progressing Goal: Ability to demonstrate self-control will improve Outcome: Progressing   Problem: Safety: Goal: Periods of time without injury will increase Outcome: Progressing   Problem: Education: Goal: Ability to state activities that reduce stress will improve Outcome: Progressing   Problem: Education: Goal: Utilization of techniques to improve thought processes will improve Outcome: Progressing   "

## 2024-05-12 NOTE — Group Note (Signed)
 Date:  05/12/2024 Time:  4:10 PM  Group Topic/Focus:  Fresh air Therapy with Music and Conversation    Participation Level:  Active  Participation Quality:  Appropriate  Affect:  Appropriate  Cognitive:  Appropriate  Insight: Appropriate  Engagement in Group:  Engaged  Modes of Intervention:  Socialization  Additional Comments:  none  Norleen SHAUNNA Bias 05/12/2024, 4:10 PM

## 2024-05-12 NOTE — Group Note (Signed)
 LCSW Group Therapy Note   Group Date: 05/12/2024 Start Time: 1400 End Time: 1430   Type of Therapy and Topic:  Group Therapy: Managing Intrusive Thoughts  Participation Level:  Did Not Attend  Description of Group:   Therapeutic Goals:  1.     Summary of Patient Progress:    Patient did not attend group.  Therapeutic Modalities:   Rexene LELON Chesley ISRAEL 05/12/2024  2:50 PM

## 2024-05-12 NOTE — Group Note (Signed)
 Date:  05/12/2024 Time:  10:52 AM  Group Topic/Focus:  Wellness Toolbox:   The focus of this group is to discuss various aspects of wellness, balancing those aspects and exploring ways to increase the ability to experience wellness.  Patients will create a wellness toolbox for use upon discharge.  I had the opportunity to go over the holiday and New Year activity sheets with each patient individually. The activity sheets provided patients with an opportunity to express personal memories, reflect on meaningful life experiences, and share favorite traditions, movies, and meals. Patients were also encouraged to discuss gentle New Year plans and goals, focusing on realistic and positive intentions for the upcoming year.  The activities supported cognitive engagement through word searches, memory recall, simple problem-solving, and reflection exercises. Patients were able to participate at their own pace, and assistance was provided as needed. The activity sheets also included a brief, gentle movement component, allowing patients to engage in light physical activity appropriate to their abilities.  Overall, the activity promoted emotional expression, social interaction, and mental stimulation in a calm and supportive manner. Patients appeared engaged and receptive, and the activity provided a meaningful opportunity for conversation, self-expression, and encouragement of overall well-being.  Participation Level:  Did Not Attend  Participation Quality:    Affect:    Cognitive:    Insight:   Engagement in Group:    Modes of Intervention:    Additional Comments:    Zariya Minner L Aleks Nawrot 05/12/2024, 10:52 AM

## 2024-05-12 NOTE — BH IP Treatment Plan (Signed)
 Interdisciplinary Treatment and Diagnostic Plan Update  05/12/2024 Time of Session: 4:50 pm [name] MRN: [id number]  Principal Diagnosis: MDD (major depressive disorder), recurrent episode, severe (HCC)  Secondary Diagnoses: Active Problems:   MDD (major depressive disorder), recurrent severe, without psychosis (HCC)   Current Medications:  Current Facility-Administered Medications  Medication Dose Route Frequency Provider Last Rate Last Admin   acetaminophen  (TYLENOL ) tablet 650 mg  650 mg Oral Q6H PRN Bobbitt, Shalon E, NP   650 mg at 05/09/24 2355   ALPRAZolam  (XANAX ) tablet 1 mg  1 mg Oral TID White, Patrice L, NP   1 mg at 05/12/24 1615   ascorbic acid  (VITAMIN C ) tablet 500 mg  500 mg Oral Daily White, Patrice L, NP   500 mg at 05/12/24 0932   busPIRone  (BUSPAR ) tablet 30 mg  30 mg Oral TID Madaram, Kondal R, MD   30 mg at 05/12/24 1617   carvedilol  (COREG ) tablet 12.5 mg  12.5 mg Oral BID WC White, Patrice L, NP   12.5 mg at 05/12/24 1617   cloNIDine  (CATAPRES ) tablet 0.2 mg  0.2 mg Oral QHS White, Patrice L, NP   0.2 mg at 05/11/24 2123   colesevelam  (WELCHOL ) tablet 1,250 mg  1,250 mg Oral BID WC White, Patrice L, NP   1,250 mg at 05/12/24 1615   cyanocobalamin  (VITAMIN B12) tablet 1,000 mcg  1,000 mcg Oral Daily White, Patrice L, NP   1,000 mcg at 05/12/24 0932   ferrous sulfate  tablet 325 mg  325 mg Oral Daily White, Patrice L, NP   325 mg at 05/12/24 0932   FLUoxetine  (PROZAC ) capsule 40 mg  40 mg Oral Daily White, Patrice L, NP   40 mg at 05/12/24 0932   hydrALAZINE  (APRESOLINE ) tablet 10 mg  10 mg Oral TID White, Patrice L, NP   10 mg at 05/12/24 1617   hydrOXYzine  (ATARAX ) tablet 50 mg  50 mg Oral Q6H PRN Madaram, Kondal R, MD   50 mg at 05/08/24 2106   levothyroxine  (SYNTHROID ) tablet 100 mcg  100 mcg Oral QAC breakfast White, Patrice L, NP   100 mcg at 05/12/24 0600   loratadine  (CLARITIN ) tablet 10 mg  10 mg Oral Daily White, Patrice L, NP   10 mg at  05/12/24 0933   menthol  (CEPACOL) lozenge 3 mg  1 lozenge Oral PRN McLauchlin, Angela, NP   3 mg at 05/02/24 0026   mirtazapine  (REMERON ) tablet 30 mg  30 mg Oral QHS White, Patrice L, NP   30 mg at 05/11/24 2122   nystatin  (MYCOSTATIN /NYSTOP ) topical powder 1 Application  1 Application Topical QHS White, Patrice L, NP   1 Application at 05/11/24 2128   OLANZapine  (ZYPREXA ) injection 5 mg  5 mg Intramuscular TID PRN White, Patrice L, NP       OLANZapine  zydis (ZYPREXA ) disintegrating tablet 5 mg  5 mg Oral TID PRN White, Patrice L, NP       omega-3 acid ethyl esters (LOVAZA ) capsule 2 g  2 capsule Oral BID White, Patrice L, NP   2 g at 05/12/24 0933   ondansetron  (ZOFRAN ) tablet 4 mg  4 mg Oral Q6H PRN Madaram, Kondal R, MD   4 mg at 05/08/24 1432   pantoprazole  (PROTONIX ) EC tablet 40 mg  40 mg Oral BID White, Patrice L, NP   40 mg at 05/12/24 0932   polyethylene glycol (MIRALAX  / GLYCOLAX ) packet 17 g  17 g Oral Daily PRN White, Patrice L,  NP       psyllium (HYDROCIL/METAMUCIL) 1 packet  1 packet Oral BID White, Patrice L, NP   1 packet at 05/12/24 9066   rosuvastatin  (CRESTOR ) tablet 10 mg  10 mg Oral Daily White, Patrice L, NP   10 mg at 05/12/24 9067   saccharomyces boulardii (FLORASTOR) capsule 250 mg  250 mg Oral BID White, Patrice L, NP   250 mg at 05/12/24 9066   traMADol  (ULTRAM ) tablet 50 mg  50 mg Oral Q6H PRN White, Patrice L, NP   50 mg at 05/11/24 2123   PTA Medications: Medications Prior to Admission  Medication Sig Dispense Refill Last Dose/Taking   acetaminophen  (TYLENOL ) 500 MG tablet Take 500 mg by mouth 2 (two) times daily.      ALPRAZolam  (XANAX ) 1 MG tablet Take 1 tablet (1 mg total) by mouth 3 (three) times daily. (Patient taking differently: Take 1 mg by mouth 3 (three) times daily. Home is only giving her twice daily instead of TID) 90 tablet 5    ascorbic acid  (VITAMIN C ) 500 MG tablet Take 1 tablet (500 mg total) by mouth daily. 30 tablet 11    aspirin  EC (ASPIRIN  LOW  DOSE) 81 MG tablet Take 1 tablet (81 mg total) by mouth daily. Swallow whole. 30 tablet 3    busPIRone  (BUSPAR ) 30 MG tablet Take 1 tablet (30 mg total) by mouth 2 (two) times daily. 60 tablet 3    carvedilol  (COREG ) 12.5 MG tablet Take 1 tablet (12.5 mg total) by mouth every evening. (Patient taking differently: Take 12.5 mg by mouth in the morning and at bedtime.) 90 tablet 3    cetirizine (ZYRTEC) 10 MG tablet Take 10 mg by mouth daily.      Cholecalciferol  (D3 5000) 125 MCG (5000 UT) capsule Take 1 capsule (5,000 Units total) by mouth daily. 30 capsule 11    cloNIDine  (CATAPRES ) 0.2 MG tablet Take 1 tablet (0.2 mg total) by mouth at bedtime. 90 tablet 3    colesevelam  (WELCHOL ) 625 MG tablet Take 2 tablets (1,250 mg total) by mouth 2 (two) times daily with a meal. 360 tablet 3    cyanocobalamin  (VITAMIN B12) 1000 MCG tablet Take 1 tablet (1,000 mcg total) by mouth daily. Take 1,000 mcg by mouth daily. 30 tablet 11    D-Mannose 500 MG CAPS Take 1 capsule (500 mg total) by mouth 2 (two) times daily. WITH CRANBERRY & DANDELION EXTRACT 60 capsule 11    diphenoxylate -atropine  (LOMOTIL ) 2.5-0.025 MG tablet Take 1 tablet by mouth 4 (four) times daily as needed for diarrhea or loose stools. 20 tablet 0    ferrous sulfate  (FEROSUL) 325 (65 FE) MG tablet Take 325 mg by mouth daily.      FLUoxetine  (PROZAC ) 40 MG capsule Take 1 capsule (40 mg total) by mouth daily. 30 capsule 11    fluticasone  (FLONASE ) 50 MCG/ACT nasal spray Place 2 sprays into both nostrils daily.      hydrALAZINE  (APRESOLINE ) 10 MG tablet Take 1 tablet by mouth 3 times daily. MAY TAKE EXTRA 10MG  FOR SBP>160 (Patient taking differently: Take 10 mg by mouth 3 (three) times daily.) 120 tablet 9    levothyroxine  (SYNTHROID ) 100 MCG tablet Take 1 tablet (100 mcg total) by mouth daily before breakfast. 30 tablet 11    loperamide  (IMODIUM  A-D) 2 MG tablet Take 1 tablet (2 mg total) by mouth 4 (four) times daily as needed for diarrhea or loose  stools. 100 tablet 11  Metamucil Fiber 2 g CHEW Chew 2 tablets by mouth in the morning and at bedtime. 120 tablet 11    mirtazapine  (REMERON ) 45 MG tablet Take 45 mg by mouth at bedtime.      nitrofurantoin , macrocrystal-monohydrate, (MACROBID ) 100 MG capsule Take 1 capsule (100 mg total) by mouth 2 (two) times daily. 10 capsule 0    nystatin  (MYCOSTATIN /NYSTOP ) powder Use every day on rash (Patient taking differently: Apply 1 Application topically at bedtime. Apply to under bilateral breast for moisture) 60 g 5    omega-3 acid ethyl esters (LOVAZA ) 1 g capsule Take 2 capsules (2 g total) by mouth 2 (two) times daily. 120 capsule 11    omeprazole  (PRILOSEC) 20 MG capsule Take 20 mg by mouth daily.      pantoprazole  (PROTONIX ) 40 MG tablet Take 40 mg by mouth 2 (two) times daily.      rosuvastatin  (CRESTOR ) 10 MG tablet Take 1 tablet (10 mg total) by mouth daily. 90 tablet 3    Saccharomyces boulardii (PROBIOTIC) 250 MG CAPS Take 250 mg by mouth 2 (two) times daily.      sodium chloride  (OCEAN) 0.65 % SOLN nasal spray Place 1 spray into both nostrils daily. 30 mL 1    traMADol  (ULTRAM ) 50 MG tablet Take 1 tablet (50 mg total) by mouth every 6 (six) hours as needed for severe pain (pain score 7-10). 120 tablet 2     Patient Stressors: Health problems    Patient Strengths: Ability for insight  Average or above average intelligence  Communication skills  General fund of knowledge  Religious Affiliation  Supportive family/friends   Treatment Modalities: Medication Management, Group therapy, Case management,  1 to 1 session with clinician, Psychoeducation, Recreational therapy.   Physician Treatment Plan for Primary Diagnosis: MDD (major depressive disorder), recurrent episode, severe (HCC) Long Term Goal(s):     Short Term Goals:    Medication Management: Evaluate patient's response, side effects, and tolerance of medication regimen.  Therapeutic Interventions: 1 to 1 sessions, Unit  Group sessions and Medication administration.  Evaluation of Outcomes: Progressing  Physician Treatment Plan for Secondary Diagnosis: Active Problems:   MDD (major depressive disorder), recurrent severe, without psychosis (HCC)  Long Term Goal(s):     Short Term Goals:       Medication Management: Evaluate patient's response, side effects, and tolerance of medication regimen.  Therapeutic Interventions: 1 to 1 sessions, Unit Group sessions and Medication administration.  Evaluation of Outcomes: Progressing   RN Treatment Plan for Primary Diagnosis: MDD (major depressive disorder), recurrent episode, severe (HCC) Long Term Goal(s): Knowledge of disease and therapeutic regimen to maintain health will improve  Short Term Goals: Ability to remain free from injury will improve, Ability to verbalize frustration and anger appropriately will improve, Ability to demonstrate self-control, Ability to participate in decision making will improve, Ability to verbalize feelings will improve, Ability to disclose and discuss suicidal ideas, Ability to identify and develop effective coping behaviors will improve, and Compliance with prescribed medications will improve  Medication Management: RN will administer medications as ordered by provider, will assess and evaluate patient's response and provide education to patient for prescribed medication. RN will report any adverse and/or side effects to prescribing provider.  Therapeutic Interventions: 1 on 1 counseling sessions, Psychoeducation, Medication administration, Evaluate responses to treatment, Monitor vital signs and CBGs as ordered, Perform/monitor CIWA, COWS, AIMS and Fall Risk screenings as ordered, Perform wound care treatments as ordered.  Evaluation of Outcomes: Progressing  LCSW Treatment Plan for Primary Diagnosis: MDD (major depressive disorder), recurrent episode, severe (HCC) Long Term Goal(s): Safe transition to appropriate next level  of care at discharge, Engage patient in therapeutic group addressing interpersonal concerns.  Short Term Goals: Engage patient in aftercare planning with referrals and resources, Increase social support, Increase ability to appropriately verbalize feelings, Increase emotional regulation, Facilitate acceptance of mental health diagnosis and concerns, and Increase skills for wellness and recovery  Therapeutic Interventions: Assess for all discharge needs, 1 to 1 time with Social worker, Explore available resources and support systems, Assess for adequacy in community support network, Educate family and significant other(s) on suicide prevention, Complete Psychosocial Assessment, Interpersonal group therapy.  Evaluation of Outcomes: Progressing   Progress in Treatment: Attending groups: No. Participating in groups: No. Taking medication as prescribed: Yes. Toleration medication: Yes. Family/Significant other contact made: Yes, individual(s) contacted:  Rosena Norfolk, son, (POA) [phone] Patient understands diagnosis: Yes. Discussing patient identified problems/goals with staff: Yes. Medical problems stabilized or resolved: Yes. Denies suicidal/homicidal ideation: Yes. Issues/concerns per patient self-inventory: No. Other: None  New problem(s) identified: No, Describe:  None Update 05/07/24: No changes at this time  Update 05/12/24: No changes at this time.   New Short Term/Long Term Goal(s):detox, elimination of symptoms of psychosis, medication management for mood stabilization; elimination of SI thoughts; development of comprehensive mental wellness/sobriety plan.  Update 05/07/24: No changes at this time  Update 05/12/24: No changes at this time.   Patient Goals:  To be in a new place where it's not like it is now. Update 05/07/24: No changes at this time   Update 05/12/24: No changes at this time.   Discharge Plan or Barriers: CSW to assist with the development of appropriate  discharge plan.  Update 05/07/24: No changes at this time  Update 05/12/24: No changes at this time.   Reason for Continuation of Hospitalization: Anxiety Delusions  Suicidal ideation   Estimated Length of Stay: 1-7 days. Update 05/07/24: TBD   Update 05/12/24: TBD  Last 3 [place] Suicide Severity Risk Score: Flowsheet Row Admission (Current) from 04/28/2024 in [facility] Most recent reading at 04/28/2024  6:00 PM ED from 04/27/2024 in [facility] Emergency Department at [facility] Most recent reading at 04/27/2024  7:21 PM ED from 04/27/2024 in [facility] Most recent reading at 04/27/2024  3:03 PM  C-SSRS RISK CATEGORY Low Risk No Risk Low Risk    Last PHQ 2/9 Scores:    04/27/2024    2:24 PM 03/06/2024    4:24 PM 10/31/2023   11:27 AM  Depression screen PHQ 2/9  Decreased Interest 2 2 1   Down, Depressed, Hopeless 2 2 1   PHQ - 2 Score 4 4 2   Altered sleeping 2 2 1   Tired, decreased energy 2 2 1   Change in appetite 2 2 1   Feeling bad or failure about yourself  0 1 1  Trouble concentrating 3 3 1   Moving slowly or fidgety/restless 2 3 1   Suicidal thoughts 1 1 0  PHQ-9 Score 16 18  8    Difficult doing work/chores Very difficult Very difficult Somewhat difficult     Data saved with a previous flowsheet row definition    Scribe for Treatment Team: Roselyn GORMAN Lento, LCSW 05/12/2024 4:49 PM

## 2024-05-13 NOTE — Group Note (Signed)
 Date:  05/13/2024 Time:  10:57 AM  Group Topic/Focus:  Coping With Mental Health Crisis:   The purpose of this group is to help patients identify strategies for coping with mental health crisis.  Group discusses possible causes of crisis and ways to manage them effectively.    Participation Level:  Did Not Attend    Norleen SHAUNNA Bias 05/13/2024, 10:57 AM

## 2024-05-13 NOTE — Group Note (Signed)
 Date:  05/13/2024 Time:  9:44 PM  Group Topic/Focus:  Wrap-Up Group:   The focus of this group is to help patients review their daily goal of treatment and discuss progress on daily workbooks.    Participation Level:  Active  Participation Quality:  Appropriate  Affect:  Appropriate  Cognitive:  Alert  Insight: Appropriate  Engagement in Group:  Engaged  Modes of Intervention:  Discussion  Additional Comments:    [name] 05/13/2024, 9:44 PM

## 2024-05-13 NOTE — Plan of Care (Signed)
   Problem: Health Behavior/Discharge Planning: Goal: Identification of resources available to assist in meeting health care needs will improve Outcome: Progressing Goal: Compliance with treatment plan for underlying cause of condition will improve Outcome: Progressing   Problem: Physical Regulation: Goal: Ability to maintain clinical measurements within normal limits will improve Outcome: Progressing   Problem: Safety: Goal: Periods of time without injury will increase Outcome: Progressing

## 2024-05-13 NOTE — Progress Notes (Signed)
 SI/HI: denies Behavior/Mood: Calm and cooperative. Flat affect. Denies anxiety and depression.  Interactions/Groups: interacting with peers and staff. Attends group. Mediations/PRNs: po meds compliant. PRN given for pain Pain: c/o neck pain 7/10. Pain mgmt maintained as ordered PRN Other: slept 11.5 hours  05/12/24 2100  Psych Admission Type (Psych Patients Only)  Admission Status Voluntary  Psychosocial Assessment  Patient Complaints Anxiety  Eye Contact Fair  Facial Expression Animated  Affect Appropriate to circumstance  Speech Logical/coherent  Interaction Assertive  Motor Activity Slow  Appearance/Hygiene In scrubs  Behavior Characteristics Cooperative  Mood Pleasant  Thought Process  Coherency WDL  Content WDL  Delusions None reported or observed  Perception WDL  Hallucination None reported or observed  Judgment Impaired  Confusion None  Danger to Self  Current suicidal ideation? Denies

## 2024-05-13 NOTE — Progress Notes (Signed)
 D- Patient alert and oriented x 4. Pt presents with a pleasant mood and affect. Pt states she did not sleep too well due to having a night,are. Pt Denies SI, HI, AVH, and pain. 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 Pt states she is trying to think positive thoughts and change her thoughts when she starts to feel overwhelmed with anxiety. Pt states her goal is to find a new nursing facility to move to.   A- Scheduled medications administered to patient, per MD orders. Support and encouragement provided.  Routine safety checks conducted every 15 minutes.  Patient informed to notify staff with problems or concerns.  R- No adverse drug reactions noted. Patient contracts for safety at this time. Patient compliant with medications and treatment plan. Patient receptive, calm, and cooperative. Patient interacts well with others on the unit.  Patient remains safe at this time.    Mehar Sagen S.,RN
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
## 2024-05-13 NOTE — Group Note (Signed)
 Date:  05/13/2024 Time:  3:54 PM  Group Topic/Focus:  Karaoke Group: The purpose of this group is for patients to interact with each other while singing their favorite songs.     Participation Level:  Did Not Attend  [name] 05/13/2024, 3:54 PM

## 2024-05-13 NOTE — Plan of Care (Signed)
" °  Problem: Education: Goal: Mental status will improve Outcome: Progressing Goal: Verbalization of understanding the information provided will improve Outcome: Progressing   Problem: Coping: Goal: Ability to verbalize frustrations and anger appropriately will improve Outcome: Progressing   Problem: Activity: Goal: Sleeping patterns will improve Outcome: Not Progressing   "

## 2024-05-14 ENCOUNTER — Ambulatory Visit: Admitting: Psychology

## 2024-05-14 NOTE — Plan of Care (Signed)
" °  Problem: Education: Goal: Mental status will improve Outcome: Progressing Goal: Verbalization of understanding the information provided will improve Outcome: Progressing   Problem: Coping: Goal: Ability to verbalize frustrations and anger appropriately will improve Outcome: Progressing   Problem: Health Behavior/Discharge Planning: Goal: Compliance with treatment plan for underlying cause of condition will improve Outcome: Progressing   Problem: Activity: Goal: Interest or engagement in activities will improve Outcome: Not Progressing   "

## 2024-05-14 NOTE — Plan of Care (Signed)

## 2024-05-14 NOTE — Group Note (Signed)
 Physical/Occupational Therapy Group Note  Group Topic: Yoga  Group Date: 05/14/2024 Start Time: 1305 End Time: 1333 Facilitators: Makia Bossi, Alm Hamilton, PT   Group Description: Group participated with series of yoga poses, designed to emphasize functional sitting balance, core stability, generalized flexibility and overall posture.  Incorporated deep breathing techniques with poses, working to promote relaxation, mindfulness and focus with targeted activities.  Discussed benefits of yoga in improving mood and self-esteem, reducing stress and anxiety, and promoting functional strength, balance and core stability for each participant.  Discussed ways to integrate into each participants daily routine.  Provided handout with written and pictorial descriptions of included yoga movements to be utilized as appropriate outside of group time.  Therapeutic Goal(s):  Demonstrate safe ability to participate with yoga poses during group activity. Identify one benefit of participation with yoga poses as part of each participants exercise/movement routine. Identify 1-2 individual poses that participant feels most beneficial to his/her needs and that he/she can easily replicate outside of group.  Individual Participation: Did not attend  Participation Level:   Participation Quality:   Behavior:   Speech/Thought Process:   Affect/Mood:   Insight:   Judgement:   Modes of Intervention:   Patient Response to Interventions:    Plan: Continue to engage patient in PT/OT groups 1 - 2x/week.  DSABRA Hamilton Bertin PT, DPT 05/14/2024, 1:40 PM

## 2024-05-14 NOTE — Group Note (Signed)
 Date:  05/14/2024 Time:  10:41 AM  Group Topic/Focus:    For geriatric mental health patients, effective exercises combine gentle movement, mind-body practices, and social engagement, focusing on activities like walking in nature, yoga, Tai Chi, water  aerobics, and light strength training, which boost mood, reduce anxiety, improve sleep, and fight cognitive decline by increasing blood flow and endorphins, while also providing structure and purpose. Small amounts of daily activity (even 10 minutes) help, with a mix of aerobic, balance, and strength exercises recommended for overall well-being.    Participation Level:  Did Not Attend  Harlene LITTIE Gavel 05/14/2024, 10:41 AM

## 2024-05-14 NOTE — [facility] Counselor (Signed)
 CSW contacted pt's nursing facility, [facility], [phone] to discuss pt's discharge back to the facility.   They report that the admissions coordinator is not currently in and that CSW needs to call back tomorrow to check in with them.   CSW will contact in AM to coordinate discharge.   Lum Croft, MSW, [place] 05/14/2024 3:25 PM

## 2024-05-14 NOTE — Progress Notes (Signed)
" °   05/13/24 2100  Psych Admission Type (Psych Patients Only)  Admission Status Voluntary  Psychosocial Assessment  Patient Complaints Anxiety  Eye Contact Fair  Facial Expression Animated  Affect Appropriate to circumstance  Speech Logical/coherent  Interaction Assertive  Motor Activity Slow  Appearance/Hygiene In scrubs;Unremarkable  Behavior Characteristics Appropriate to situation;Cooperative  Mood Pleasant  Thought Process  Coherency WDL  Content WDL  Delusions None reported or observed  Perception WDL  Hallucination None reported or observed  Judgment WDL  Confusion None  Danger to Self  Current suicidal ideation? Denies  Agreement Not to Harm Self Yes  Description of Agreement verbal  Danger to Others  Danger to Others None reported or observed   Mood/Behavior:  Anxious. Pleasant and cooperative.    Psych assessment: Denies SI/HI and AVH.     Interaction / Group attendance:  Present in the milieu. Interacting with peers and staff.  Attended group.   Medication/ PRNs: Compliant with scheduled medications. No prns required.   Pain: Denies   15 min checks in place for safety. "

## 2024-05-14 NOTE — Group Note (Signed)
 Recreation Therapy Group Note   Group Topic:Coping Skills  Group Date: 05/14/2024 Start Time: 1415 End Time: 1440 Facilitators: Celestia Jeoffrey BRAVO, LRT, CTRS Location: Courtyard  Group Description: Music. Patients are encouraged to name their favorite song(s) for LRT to play song through speaker for group to hear, while in the courtyard getting fresh air and sunlight. Patients educated on the definition of leisure and the importance of having different leisure interests outside of the hospital. Group discussed how leisure activities can often be used as pharmacologist and that listening to music and being outside are examples.    Goal Area(s) Addressed:  Patient will identify a current leisure interest.  Patient will practice making a positive decision. Patient will have the opportunity to try a new leisure activity.   Affect/Mood: N/A   Participation Level: Did not attend    Clinical Observations/Individualized Feedback: Patient did not attend.  Plan: Continue to engage patient in RT group sessions 2-3x/week.   Jeoffrey BRAVO Celestia, LRT, CTRS 05/14/2024 4:40 PM
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
## 2024-05-14 NOTE — Progress Notes (Signed)
 D- Patient alert and oriented x 4. Pt presents with a pleasant mood and affect. Denies SI, HI, AVH, but endorsed headache and neck pain 7/10. PRN tramadol  given. Pt states her goal is to be discharged soon and continue to try and think positively about going back to her nursing facility. Pt rates anxiety 4/10 and that she slept off and on for a few hours.   A- Scheduled/PRN medications administered to patient, per MD orders. Support and encouragement provided.  Routine safety checks conducted every 15 minutes.  Patient informed to notify staff with problems or concerns.  R- No adverse drug reactions noted. Patient contracts for safety at this time. Patient compliant with medications and treatment plan. Patient receptive, calm, and cooperative. Patient did not interact much  with others on the unit except for meals. Encouragement provided to engage in milieu. Patient remains safe at this time.    Lilygrace Rodick S.,RN

## 2024-05-14 NOTE — [place] FL2 (Signed)
 " [place]  MEDICAID FL2 LEVEL OF CARE FORM     IDENTIFICATION  Patient Name: [name] Birthdate: 1950/07/20 Sex: female Admission Date (Current Location): 04/28/2024  [place] and [place] Number:  [place] [id number] S Facility and Address:  [facility], [address] [place], [place] [address]      Provider Number: [id number]  Attending Physician Name and Address:  Donnelly Mellow, MD  Relative Name and Phone Number:  [name], [name], [phone]    Current Level of Care: Hospital Recommended Level of Care: Nursing Facility, Assisted Living Facility (Assisted Living Memory Care most appropriate due to Dementia diagnosis) Prior Approval Number:    Date Approved/Denied:   PASRR Number:    Discharge Plan: Other (Comment) (NF (pt came from [facility] in [place]))    Current Diagnoses: Patient Active Problem List   Diagnosis Date Noted   MDD (major depressive disorder), recurrent severe, without psychosis (HCC) 05/01/2024   Poor balance 05/25/2023   Symptomatic anemia 01/18/2023   Osteoarthritis of right hip 01/13/2023   S/P total right hip arthroplasty 01/13/2023   H/O parathyroidectomy 12/22/2022   Hyponatremia 11/20/2022   Hip pain, chronic, right 09/09/2022   Ataxia 08/16/2022   Falls frequently 08/16/2022   Flank pain 06/14/2022   History of hyperparathyroidism 05/21/2022   Diaphoresis 05/21/2022   Cystitis 05/21/2022   Hyperglycemia 03/01/2022   Bilateral pseudophakia 02/04/2022   SK (seborrheic keratosis) 12/15/2021   Parathyroid  adenoma 10/08/2021   Hypercalcemia 07/29/2021   History of COVID-19 07/06/2021   Adjustment disorder with mixed anxiety and depressed mood 06/24/2021   Atherosclerosis of aorta 12/11/2020   Low BP 09/10/2020   Physical deconditioning 09/10/2020   Osteopenia 03/24/2020   Genital herpes simplex 03/24/2020   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   Bipolar I disorder (HCC) 03/24/2020    Wears glasses    Urgency of urination    Tuberculosis    Status post chemotherapy    S/P dilatation of esophageal stricture    Renal calculus, bilateral    Positive reaction to tuberculin skin test    Pneumonia    Mild persistent asthma    IDA (iron  deficiency anemia)    Hx of transfusion of packed red blood cells    History of syncope    History of positive PPD    History of kidney stones    History of DVT (deep vein thrombosis)    History of concussion    GAD (generalized anxiety disorder)    CKD stage 3b, GFR 30-44 ml/min (HCC)    Chronically dry eyes    Chronic cystitis    Chronic bronchitis (HCC)    Arthritis    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   Pelvic mass in female 09/20/2019   Ureteral stone with hydronephrosis 09/04/2019   Edema 08/27/2019   Bladder pain 08/2019   Dysuria 05/17/2019   Suspected 2019 novel coronavirus infection 04/25/2019   Urinary urgency 03/06/2019   Diarrhea 03/06/2019   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   Dyspnea on exertion 08/25/2017   Chest pain in adult 07/13/2017   HTN (hypertension) 05/31/2017   Anemia 05/31/2017   Leg pain, anterior, left 12/15/2016   Paresthesia 12/15/2016   Syncope 10/13/2016   Migraine headache 10/13/2016   Asthma 10/13/2016   Anxiety 10/13/2016   Hyperlipidemia 10/13/2016   UTI (urinary tract infection) 10/13/2016   Hypothyroidism 10/13/2016   Laryngopharyngeal reflux (LPR) 06/15/2016   Acute upper respiratory infection 05/05/2016   Degenerative disc disease, cervical 11/29/2015   IBS (irritable  bowel syndrome) 11/24/2015   Facet arthritis of cervical region 10/16/2015   Chronic urinary tract infection 07/22/2015   Malaise and fatigue 04/16/2015   Severe episode of recurrent major depressive disorder, without psychotic features (HCC) 12/18/2014   Post concussion syndrome 10/28/2014   Brain syndrome, 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   Major depressive disorder, recurrent, severe without psychotic features  (HCC)    Cervical nerve root disorder 09/18/2014   Concussion w/o coma 09/18/2014   Irritable bowel syndrome with diarrhea 09/12/2014   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   Cold intolerance 03/26/2014   Cough variant asthma  vs UACS/ irritable larynx  03/26/2014   Abdominal pain 03/26/2014   Positive skin test 03/26/2014   Adjustment disorder with anxiety 02/07/2014   Cervical pain 12/05/2013   Fibromyalgia 12/05/2013   Cephalalgia 12/05/2013   Adult hypothyroidism 12/05/2013   Adaptive colitis 12/05/2013   Menopause 12/05/2013   Mild cognitive disorder 12/05/2013   Feeling bilious 12/05/2013   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   Lung mass 09/10/2013   Lung nodule, solitary 09/10/2013   Abnormal chest x-ray    Vitamin D  deficiency 04/25/2013   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   Female genuine stress incontinence 04/12/2012   Healthcare maintenance 10/30/2010   MUSCLE PAIN 07/03/2008   DEEP PHLEBOTHROMBOSIS PP UNSPEC AS EPIS CARE 02/14/2008   Bipolar affective disorder, current episode depressed (HCC) 02/14/2008   Deep phlebothrombosis, postpartum 02/14/2008   ALLERGIC RHINITIS 03/04/2007   GERD 03/04/2007   IRRITABLE BOWEL SYNDROME, HX OF 03/04/2007    Orientation RESPIRATION BLADDER Height & Weight     Self, Time, Situation, Place  Normal Continent Weight: 167 lb 8 oz (76 kg) Height:  5' 2 (157.5 cm)  BEHAVIORAL SYMPTOMS/MOOD NEUROLOGICAL BOWEL NUTRITION STATUS      Continent    AMBULATORY STATUS COMMUNICATION OF NEEDS Skin   Independent Verbally Normal                       Personal Care Assistance Level of Assistance              Functional Limitations Info  Sight (Glasses) Sight Info: Impaired        SPECIAL CARE FACTORS FREQUENCY  Blood pressure Blood Pressure Frequency: 2 times daily                  Contractures Contractures Info: Not present    Additional Factors Info   Code Status, Allergies Code Status Info: FULL Allergies Info: Morphenine and Codeine, Amlodipine , Auvelity  (dextromethorphan -bupropion  Er), Buprenorphine, Cephalexin , Sulfamethoxazole -trimethoprim , Sumatriptan , Codeine, Dust Mite Extract, Molds & Smuts, Tape           Current Medications (05/14/2024):  This is the current hospital active medication list Current Facility-Administered Medications  Medication Dose Route Frequency Provider Last Rate Last Admin   acetaminophen  (TYLENOL ) tablet 650 mg  650 mg Oral Q6H PRN Bobbitt, Shalon E, NP   650 mg at 05/09/24 2355   ALPRAZolam  (XANAX ) tablet 1 mg  1 mg Oral TID White, Patrice L, NP   1 mg at 05/14/24 0945   ascorbic acid  (VITAMIN C ) tablet 500 mg  500 mg Oral Daily White, Patrice L, NP   500 mg at 05/14/24 0944   busPIRone  (BUSPAR ) tablet 30 mg  30 mg Oral TID Madaram, Kondal R, MD   30 mg at 05/14/24 0946   carvedilol  (COREG ) tablet 12.5 mg  12.5 mg Oral BID WC White, Patrice  L, NP   12.5 mg at 05/14/24 0830   cloNIDine  (CATAPRES ) tablet 0.2 mg  0.2 mg Oral QHS White, Patrice L, NP   0.2 mg at 05/13/24 2150   colesevelam  (WELCHOL ) tablet 1,250 mg  1,250 mg Oral BID WC White, Patrice L, NP   1,250 mg at 05/14/24 9165   cyanocobalamin  (VITAMIN B12) tablet 1,000 mcg  1,000 mcg Oral Daily White, Patrice L, NP   1,000 mcg at 05/14/24 0945   ferrous sulfate  tablet 325 mg  325 mg Oral Daily White, Patrice L, NP   325 mg at 05/14/24 0945   FLUoxetine  (PROZAC ) capsule 40 mg  40 mg Oral Daily White, Patrice L, NP   40 mg at 05/14/24 0945   hydrALAZINE  (APRESOLINE ) tablet 10 mg  10 mg Oral TID Teresa Jes L, NP   10 mg at 05/14/24 0945   hydrOXYzine  (ATARAX ) tablet 50 mg  50 mg Oral Q6H PRN Madaram, Kondal R, MD   50 mg at 05/14/24 1352   levothyroxine  (SYNTHROID ) tablet 100 mcg  100 mcg Oral QAC breakfast White, Patrice L, NP   100 mcg at 05/14/24 9368   loratadine  (CLARITIN ) tablet 10 mg  10 mg Oral Daily White, Patrice L, NP   10 mg at 05/14/24  0945   menthol  (CEPACOL) lozenge 3 mg  1 lozenge Oral PRN McLauchlin, Angela, NP   3 mg at 05/02/24 0026   mirtazapine  (REMERON ) tablet 30 mg  30 mg Oral QHS White, Patrice L, NP   30 mg at 05/13/24 2150   nystatin  (MYCOSTATIN /NYSTOP ) topical powder 1 Application  1 Application Topical QHS White, Patrice L, NP   1 Application at 05/13/24 2151   OLANZapine  (ZYPREXA ) injection 5 mg  5 mg Intramuscular TID PRN White, Patrice L, NP       OLANZapine  zydis (ZYPREXA ) disintegrating tablet 5 mg  5 mg Oral TID PRN White, Patrice L, NP       omega-3 acid ethyl esters (LOVAZA ) capsule 2 g  2 capsule Oral BID White, Patrice L, NP   2 g at 05/14/24 0945   ondansetron  (ZOFRAN ) tablet 4 mg  4 mg Oral Q6H PRN Madaram, Kondal R, MD   4 mg at 05/14/24 1434   pantoprazole  (PROTONIX ) EC tablet 40 mg  40 mg Oral BID White, Patrice L, NP   40 mg at 05/14/24 0945   polyethylene glycol (MIRALAX  / GLYCOLAX ) packet 17 g  17 g Oral Daily PRN White, Patrice L, NP       psyllium (HYDROCIL/METAMUCIL) 1 packet  1 packet Oral BID White, Patrice L, NP   1 packet at 05/14/24 0948   rosuvastatin  (CRESTOR ) tablet 10 mg  10 mg Oral Daily White, Patrice L, NP   10 mg at 05/14/24 0945   saccharomyces boulardii (FLORASTOR) capsule 250 mg  250 mg Oral BID White, Patrice L, NP   250 mg at 05/14/24 9045   traMADol  (ULTRAM ) tablet 50 mg  50 mg Oral Q6H PRN White, Patrice L, NP   50 mg at 05/14/24 9166     Discharge Medications: Please see discharge summary for a list of discharge medications.  Relevant Imaging Results:  Relevant Lab Results:   Additional Information SSN# [id number]  [name], LCSWA     "

## 2024-05-14 NOTE — Group Note (Signed)
 Date:  05/14/2024 Time:  11:15 PM  Group Topic/Focus:  Wrap-Up Group:   The focus of this group is to help patients review their daily goal of treatment and discuss progress on daily workbooks.    Participation Level:  Active  Participation Quality:  Appropriate  Affect:  Appropriate  Cognitive:  Alert  Insight: Appropriate  Engagement in Group:  Engaged  Modes of Intervention:  Discussion  Additional Comments:    [name] CHRISTELLA Bunker 05/14/2024, 11:15 PM

## 2024-05-14 NOTE — Progress Notes (Unsigned)
 SABRA

## 2024-05-15 ENCOUNTER — Other Ambulatory Visit (HOSPITAL_COMMUNITY): Payer: Self-pay

## 2024-05-15 ENCOUNTER — Other Ambulatory Visit: Payer: Self-pay

## 2024-05-15 MED ORDER — MIRTAZAPINE 30 MG PO TABS
30.0000 mg | ORAL_TABLET | Freq: Every day | ORAL | 0 refills | Status: AC
  Filled 2024-05-15: qty 30, 30d supply, fill #0

## 2024-05-15 NOTE — BHH Suicide Risk Assessment (Signed)
 [facility] Discharge Suicide Risk Assessment   Principal Problem: MDD (major depressive disorder), recurrent episode, severe (HCC) Discharge Diagnoses: Active Problems:   MDD (major depressive disorder), recurrent severe, without psychosis (HCC)   Total Time spent with patient: 30 minutes  Musculoskeletal: Strength & Muscle Tone: within normal limits Gait & Station: unsteady Patient leans: Front  Psychiatric Specialty Exam  Presentation  General Appearance:  Well Groomed  Eye Contact: Fair  Speech: Normal Rate  Speech Volume: Normal  Handedness: Right   Mood and Affect  Mood: Euthymic  Duration of Depression Symptoms: Greater than two weeks  Affect: Appropriate; Congruent   Thought Process  Thought Processes: Coherent  Descriptions of Associations:Intact  Orientation:Full (Time, Place and Person)  Thought Content:Logical  History of Schizophrenia/Schizoaffective disorder:No  Duration of Psychotic Symptoms:No data recorded Hallucinations:Hallucinations: None  Ideas of Reference:None  Suicidal Thoughts:Suicidal Thoughts: No  Homicidal Thoughts:Homicidal Thoughts: No   Sensorium  Memory: Immediate Good; Recent Good; Remote Fair  Judgment: Fair  Insight: Fair   Art Therapist  Concentration: Fair  Attention Span: Fair  Recall: [employer] of Knowledge: Fair  Language: Fair   Psychomotor Activity  Psychomotor Activity: Psychomotor Activity: Normal   Assets  Assets: Desire for Improvement; Communication Skills; Financial Resources/Insurance; Social Support   Sleep  Sleep: Sleep: Fair  Estimated Sleeping Duration (Last 24 Hours): 7.75-8.75 hours  Physical Exam: Physical Exam ROS Blood pressure 109/63, pulse 60, temperature (!) 97.4 F (36.3 C), resp. rate 16, height 5' 2 (1.575 m), weight 76 kg, SpO2 96%. Body mass index is 30.64 kg/m.  Mental Status Per Nursing Assessment::   On Admission:   NA  Demographic Factors:  Age 48 or older, Divorced or widowed, Low socioeconomic status, and Unemployed  Loss Factors: Decline in physical health and Financial problems/change in socioeconomic status  Historical Factors: Prior suicide attempts  Risk Reduction Factors:   Sense of responsibility to family, Religious beliefs about death, Positive social support, Positive therapeutic relationship, and Positive coping skills or problem solving skills  Continued Clinical Symptoms:  Panic Attacks Previous Psychiatric Diagnoses and Treatments  Cognitive Features That Contribute To Risk:  None    Suicide Risk:  Minimal: No identifiable suicidal ideation.  Patients presenting with no risk factors but with morbid ruminations; may be classified as minimal risk based on the severity of the depressive symptoms   Follow-up Information     [facility] Follow up.   Why: In person primary care appointment is 06/14/23 at 9:10 AM with Dr. Garald, MD. Contact information: [address], [place], [place] [address]  Phone: [phone] Fax: [phone]        [facility] Follow up.   Why: Virtual psychiatry appointment is 05/08/24 at 2 PM with Alm L. Gutterman, PhD. Contact information: [address] [place], [place] [address]  Phone: [phone] Fax: [phone]                Plan Of Care/Follow-up recommendations:  Follow up recommendations: # It is recommended to the patient to continue psychiatric medications as prescribed, after discharge from the hospital.   # It is recommended to the patient to follow up with your outpatient psychiatric provider and PCP. # It was discussed with the patient, the impact of alcohol , drugs, tobacco have been there overall psychiatric and medical wellbeing, and total abstinence from substance use was recommended. # Prescriptions provided or sent  directly to preferred pharmacy at  discharge. Patient agreeable to plan. Given the opportunity to ask questions. Appears to feel comfortable with discharge.  # In the event of worsening symptoms, the patient is instructed to call the crisis hotline (988), 911 and or go to the nearest ED for appropriate evaluation and treatment of symptoms. To follow-up with primary care provider for other medical issues, concerns and or health care needs  Desmond Chimera, MD 05/15/2024, 11:09 AM

## 2024-05-15 NOTE — Plan of Care (Signed)
  Problem: Education: Goal: Emotional status will improve Outcome: Progressing Goal: Mental status will improve Outcome: Progressing Goal: Verbalization of understanding the information provided will improve Outcome: Progressing   Problem: Activity: Goal: Interest or engagement in activities will improve Outcome: Progressing Goal: Sleeping patterns will improve Outcome: Progressing   Problem: Physical Regulation: Goal: Ability to maintain clinical measurements within normal limits will improve Outcome: Progressing   Problem: Safety: Goal: Periods of time without injury will increase Outcome: Progressing

## 2024-05-15 NOTE — BHH Counselor (Signed)
 CSW faxed Discharge Summary to [facility], F# [phone] per their request.   Lum Croft, MSW, [facility] 05/15/2024 11:35 AM

## 2024-05-15 NOTE — Plan of Care (Signed)
 Patient discharged back to [facility] in [place].  Attempted report 3x with the last attempt I left a message with the receptionist who took my name and number and information that the patient would be returning today.  Safe transport arrived and patient escorted to the medical mall exit by staff with belongings.

## 2024-05-15 NOTE — Group Note (Signed)
 Date:  05/15/2024 Time:  10:44 AM  Group Topic/Focus:   Effective coping skills for seniors involve a mix of mental, physical, and social activities, like mindfulness, light exercise (walking, yoga), creative outlets (art, music, writing), maintaining routines, and fostering social connections, which all reduce stress, combat loneliness, boost mood, and build resilience for life's changes. Deep breathing and journaling also offer immediate calm, while therapy can provide professional support.   Participation Level:  Did Not Attend   Harlene LITTIE Gavel 05/15/2024, 10:44 AM

## 2024-05-15 NOTE — Progress Notes (Signed)
 Sleep Hours - 7 PRNs needed - Tylenol  for pain  Patient concerns - anxiety 4/10. Denied depression,SI, HI & AVH     05/14/24 2300  Psych Admission Type (Psych Patients Only)  Admission Status Voluntary  Psychosocial Assessment  Patient Complaints Anxiety  Eye Contact Fair  Facial Expression Anxious  Affect Appropriate to circumstance  Speech Logical/coherent  Interaction Assertive  Motor Activity Slow  Appearance/Hygiene Unremarkable  Behavior Characteristics Cooperative;Appropriate to situation  Mood Pleasant  Thought Process  Coherency WDL  Content WDL  Delusions None reported or observed  Perception WDL  Hallucination None reported or observed  Judgment WDL  Confusion None  Danger to Self  Current suicidal ideation? Denies  Agreement Not to Harm Self Yes  Description of Agreement verbal  Danger to Others  Danger to Others None reported or observed

## 2024-05-15 NOTE — BHH Counselor (Signed)
 CSW provided pt copies of FL2 per her request and list of Assisted Living Facilities in [place] per her request.   Lum Croft, MSW, [facility] 05/15/2024 11:17 AM

## 2024-05-15 NOTE — Care Management Important Message (Signed)
 Important Message  Patient Details  Name: [name] MRN: [id number] Date of Birth: 1950/10/30   Important Message Given:  Yes - Medicare IM     [name], LCSWA 05/15/2024, 10:33 AM

## 2024-05-15 NOTE — BHH Counselor (Signed)
 CSW contacted pt's nursing facility, [facility], [phone] to discuss pt's discharge back to the facility.   CSW spoke to [place], who reports that pt's discharge summary needs to be faxed to Fax # [phone] and report needs to be called by the nursing staff to the facility. (Pt was in room 208 at [facility])  CSW informed care team.   Lum Croft, MSW, [facility] 05/15/2024 8:57 AM

## 2024-05-15 NOTE — Discharge Summary (Signed)
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 much to take how to take this when to take this additional instructions    levothyroxine  100 MCG tablet Commonly known as: SYNTHROID   Take 1 tablet (100 mcg total) by mouth daily before breakfast.    loperamide  2 MG tablet Commonly known as: IMODIUM  A-D Take 1 tablet (2 mg total) by mouth 4 (four) times daily as needed for diarrhea or loose stools.  Indication: Diarrhea   Metamucil Fiber 2 g Chew Chew 2 tablets by mouth in the morning and at bedtime.    mirtazapine  30 MG tablet Commonly known as: REMERON  Take 1 tablet (30 mg total) by mouth at bedtime. What changed:  medication strength how much to take    nystatin  powder Commonly known as: MYCOSTATIN /NYSTOP  Use every day on rash What changed:  how much to take how to take this when to take this additional instructions    omega-3 acid ethyl esters 1 g capsule Commonly known as: LOVAZA  Take 2  capsules (2 g total) by mouth 2 (two) times daily.  Indication: High Amount of Triglycerides in the Blood   omeprazole  20 MG capsule Commonly known as: PRILOSEC Take 20 mg by mouth daily.    Probiotic 250 MG Caps Take 250 mg by mouth 2 (two) times daily.    rosuvastatin  10 MG tablet Commonly known as: CRESTOR  Take 1 tablet (10 mg total) by mouth daily.    sodium chloride  0.65 % Soln nasal spray Commonly known as: OCEAN Place 1 spray into both nostrils daily.    traMADol  50 MG tablet Commonly known as: ULTRAM  Take 1 tablet (50 mg total) by mouth every 6 (six) hours as needed for severe pain (pain score 7-10).         Follow-up Information     [facility] at [facility] Follow up.   Why: In person primary care appointment is 06/14/23 at 9:10 AM with [place]. Garald, [place]. Contact information: [address], [place], [name]  Phone: [phone] Fax: [phone]        [facility] at [facility] Follow up.   Why: Virtual psychiatry appointment is 05/08/24 at 2 PM with Alm L. Gutterman, [place]. Contact information: [address] [place]. [place], [place] [address]  Phone: [phone] Fax: [phone]                 Follow up recommendations: # It is recommended to the patient to continue psychiatric medications as prescribed, after discharge from the hospital.   # It is recommended to the patient to follow up with your outpatient psychiatric provider and PCP. # It was discussed with the patient, the impact of alcohol , drugs, tobacco have been there overall psychiatric and medical wellbeing, and total abstinence from substance use was recommended. # Prescriptions provided or sent directly to preferred pharmacy at discharge. Patient agreeable to plan. Given the opportunity to ask questions. Appears to feel comfortable with discharge.  # In the event of worsening symptoms, the patient is instructed to call  the crisis hotline (988), 911 and or go to the nearest ED for appropriate evaluation and treatment of symptoms. To follow-up with primary care provider for other medical issues, concerns and or health care needs  Signed: Desmond Chimera, [place] 05/15/2024, 11:12 AM           [1]  Social History Tobacco Use  Smoking Status Never   Passive exposure: Never  Smokeless Tobacco Never   "

## 2024-05-15 NOTE — Progress Notes (Signed)
" °  [facility] Adult Case Management Discharge Plan :  Will you be returning to the same living situation after discharge:  Yes,  pt will return to [facility]  At discharge, do you have transportation home?: Yes,  CSW will assist with transportation  Do you have the ability to pay for your medications: Yes,  [employer] MEDICARE / [facility] MEDICARE  Release of information consent forms completed and in the chart;  Patient's signature needed at discharge.  Patient to Follow up at:  Follow-up Information     [facility] at [facility] Follow up.   Why: In person primary care appointment is 06/14/23 at 9:10 AM with Dr. Garald, MD. Contact information: [address], [place], [place] [address]  Phone: [phone] Fax: [phone]        [facility]  Behavioral Medicine at [facility] Follow up.   Why: Virtual psychiatry appointment is 05/08/24 at 2 PM with Alm L. Gutterman, PhD. Contact information: [address] [place], [place] [address]  Phone: [phone] Fax: [phone]                Next level of care provider has access to [facility] Link:no  Safety Planning and Suicide Prevention discussed: Chaney  [name], son, [phone]     Has patient been referred to the Quitline?: Patient does not use tobacco/nicotine products  Patient has been referred for addiction treatment: No known substance use disorder.  [address], LCSWA 05/15/2024, 11:23 AM "

## 2024-05-16 ENCOUNTER — Other Ambulatory Visit: Payer: Self-pay

## 2024-05-18 ENCOUNTER — Telehealth: Payer: Self-pay

## 2024-05-21 NOTE — Telephone Encounter (Signed)
 Error

## 2024-05-22 ENCOUNTER — Ambulatory Visit: Admitting: Psychology

## 2024-05-22 DIAGNOSIS — F4321 Adjustment disorder with depressed mood: Secondary | ICD-10-CM

## 2024-05-22 DIAGNOSIS — F4323 Adjustment disorder with mixed anxiety and depressed mood: Secondary | ICD-10-CM

## 2024-05-22 NOTE — Progress Notes (Signed)
 "                                                                                                                                                [name] is a 74 y.o. 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  Symptoms A diagnosis of an acute, serious illness that is life-threatening. (Status: maintained) -- No Description Entered  Diminished interest in or enjoyment of activities. (Status: maintained) -- No Description Entered  Lack of energy. (Status: maintained) -- No Description Entered  Sad affect, social withdrawal, anxiety, loss of interest in activities, and low energy. (Status: maintained) -- No Description Entered  Medication Status compliance  Safety unspecified If Suicidal or Homicidal State Action Taken: unspecified  Current Risk:  Medications Buspar  (Dosage: 30mg  2X/day)  New Medication (Dosage: 250mg  2x/day)  New Medication (Dosage: 250mg  2x/day)  Prozac  (Dosage: 40mg  daily)  Remeron  (Dosage: 30mg  nightly)  xanax  (Dosage: .5mg  2-3X/day)  Objectives Related Problem: Recognize, accept, and cope with feelings of depression. Description: Identify and replace thoughts and beliefs that support depression. Target Date: 2025-04-28 Frequency: Daily Modality: individual Progress: 90%  Related Problem: Recognize, accept, and cope with feelings of depression. Description: Verbalize an understanding of healthy and unhealthy emotions with the intent of increasing the use of healthy emotions to guide actions. Target Date: 2025-04-28 Frequency: Daily Modality: individual Progress: 85%  Related Problem: Recognize, accept, and cope with feelings of depression. Description: Increasingly verbalize hopeful and positive statements regarding self, others,  and the future. Target Date: 2022-04-28 Frequency: Daily Modality: individual Progress: 100%  Related Problem: Reduce fear, anxiety, and worry associated with the medical condition. Description: Share with significant others efforts to adapt successfully to the medical condition/diagnosis. Target Date: 2025-04-28 Frequency: Daily Modality: individual Progress: 85%  Related Problem: Reduce fear, anxiety, and worry associated with the medical condition. Description: Engage in social, productive, and recreational activities that are possible in spite of medical condition. Target Date: 2025-04-28 Frequency: Daily Modality: individual Progress: 90%  Related Problem: Reduce fear, anxiety, and worry associated with the medical condition. Description: Identify the coping skills and sources of emotional support that have been beneficial in the past. Target Date: 2025-04-28 Frequency: Daily Modality: individual Progress: 95%  Client Response full compliance  Service Location Location, [address] [place], [place] [address]  Service Code cpt [phone]  Related past to present  Facilitate problem solving  Identify/label emotions  Validate/empathize  Self care activities  Lifestyle change (exercise, nutrition)  Assess/facilitate readiness to change  Relaxation training  Rationally challenge thoughts or beliefs/cognitive restructuring  Comments  Dx: F43.21  Meds: Prozac , Buspar  and Remeron .  Goals: Following the failure of a third marriage, patient is struggling to rebuild her life and adjust to her new circumstances. She suffers a negative self-concept and  has little confidence in her judgement. She is seeking to reduce symptoms of anxiety and self-doubt and to re-define satisfa ction moving forward. Therapy will focus on cognitive restructuring to redefine her experiences and focus on those satisfying aspects of her life. Will target this goal completion for April1, 2021  (completed). She is also wanting to utilize counseling to adjust and accept her latest diagnosis of cancer. She wants assistance to better manage this news and remain positive throughout treatment. Goal date is 12-22. Patient has made progress, but her medical condition is variable and she requests additional treatment to manage moods throughout her medical ordeal. In addition, she is trying to manage feelings associated with her older son's mental health struggles. Revised goal date is 12-26.   Patient agrees to a video session and is aware of the limitations of this platform. She is at home and I am in home office.   [name]: Reports that she was at [facility] psychiatric hospital for 2 weeks. She says she was profoundly depressed, but not suicidal. Her symptoms were loss of appetite, tremors, sleep disturbance and hopelessness. She said that she went in with optimism because of her prior inpatient experience which she found very helpful. The experience was very positive and she is glad she went to the hospital. Her new prescriber is [place] Brown, PA-C ([facility]). Charlotte is in-house at [employer]. She is feeling far more even. Says she is communicating frequently with [name] and is less distressed about the minimal contact she has with him.  [name] Time: 2:15p-3:00p 45 minutes                                                "

## 2024-05-28 ENCOUNTER — Ambulatory Visit: Admitting: Psychology

## 2024-05-28 DIAGNOSIS — F4321 Adjustment disorder with depressed mood: Secondary | ICD-10-CM | POA: Diagnosis not present

## 2024-05-28 DIAGNOSIS — F4323 Adjustment disorder with mixed anxiety and depressed mood: Secondary | ICD-10-CM

## 2024-05-28 NOTE — Progress Notes (Signed)
 "                                                                                                                                                               [name] is a 74 y.o. 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  Symptoms A diagnosis of an acute, serious illness that is life-threatening. (Status: maintained) -- No Description Entered  Diminished interest in or enjoyment of activities. (Status: maintained) -- No Description Entered  Lack of energy. (Status: maintained) -- No Description Entered  Sad affect, social withdrawal, anxiety, loss of interest in activities, and low energy. (Status: maintained) -- No Description Entered  Medication Status compliance  Safety unspecified If Suicidal or Homicidal State Action Taken: unspecified  Current Risk:  Medications Buspar  (Dosage: 30mg  2X/day)  New Medication (Dosage: 250mg  2x/day)  New Medication (Dosage: 250mg  2x/day)  Prozac  (Dosage: 40mg  daily)  Remeron  (Dosage: 30mg  nightly)  xanax  (Dosage: .5mg  2-3X/day)  Objectives Related Problem: Recognize, accept, and cope with feelings of depression. Description: Identify and replace thoughts and beliefs that support depression. Target Date: 2025-04-28 Frequency: Daily Modality: individual Progress: 90%  Related Problem: Recognize, accept, and cope with feelings of depression. Description: Verbalize an understanding of healthy and unhealthy emotions with the intent of increasing the use of healthy emotions to guide actions. Target Date: 2025-04-28 Frequency: Daily Modality: individual Progress: 85%  Related Problem: Recognize, accept, and cope with feelings of depression. Description: Increasingly verbalize hopeful and positive  statements regarding self, others, and the future. Target Date: 2022-04-28 Frequency: Daily Modality: individual Progress: 100%  Related Problem: Reduce fear, anxiety, and worry associated with the medical condition. Description: Share with significant others efforts to adapt successfully to the medical condition/diagnosis. Target Date: 2025-04-28 Frequency: Daily Modality: individual Progress: 85%  Related Problem: Reduce fear, anxiety, and worry associated with the medical condition. Description: Engage in social, productive, and recreational activities that are possible in spite of medical condition. Target Date: 2025-04-28 Frequency: Daily Modality: individual Progress: 90%  Related Problem: Reduce fear, anxiety, and worry associated with the medical condition. Description: Identify the coping skills and sources of emotional support that have been beneficial in the past. Target Date: 2025-04-28 Frequency: Daily Modality: individual Progress: 95%  Client Response full compliance  Service Location Location, [address] [place], [place] [address]  Service Code cpt [phone]  Related past to present  Facilitate problem solving  Identify/label emotions  Validate/empathize  Self care activities  Lifestyle change (exercise, nutrition)  Assess/facilitate readiness to change  Relaxation training  Rationally challenge thoughts or beliefs/cognitive restructuring  Comments  Dx: F43.21  Meds: Prozac , Buspar  and Remeron .  Goals: Following the failure of a third marriage, patient is struggling to  rebuild her life and adjust to her new circumstances. She suffers a negative self-concept and has little confidence in her judgement. She is seeking to reduce symptoms of anxiety and self-doubt and to re-define satisfa ction moving forward. Therapy will focus on cognitive restructuring to redefine her experiences and focus on those satisfying aspects of her life. Will target this goal  completion for April1, 2021 (completed). She is also wanting to utilize counseling to adjust and accept her latest diagnosis of cancer. She wants assistance to better manage this news and remain positive throughout treatment. Goal date is 12-22. Patient has made progress, but her medical condition is variable and she requests additional treatment to manage moods throughout her medical ordeal. In addition, she is trying to manage feelings associated with her older son's mental health struggles. Revised goal date is 12-26.  Additional goals: Patient has transitioned to a nursing/retirement home. She is seeking to better adjust to these new living circumstances. Goal date is 12-26.   Patient agrees to a video session and is aware of the limitations of this platform. She is at home and I am in home office.   [name]: Reports that she spends much of her day in bed, but is trying to exercise daily. [name] talked about her experience in the psychiatric facility and how it helped her come to terms with some of the family dynamics which created some tensions with her sons. She felt that they have unfairly characterized her as often unavailable. She struggles to forgive herself and accept that her boys have a distorted perception.  [name] Time: 4:15p-5:00p 45 minutes                                                "

## 2024-06-05 ENCOUNTER — Ambulatory Visit: Admitting: Psychology

## 2024-06-05 DIAGNOSIS — F4323 Adjustment disorder with mixed anxiety and depressed mood: Secondary | ICD-10-CM | POA: Diagnosis not present

## 2024-06-05 NOTE — Progress Notes (Signed)
 "                                                                                                                                                                              [name] is a 74 y.o. 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  Symptoms A diagnosis of an acute, serious illness that is life-threatening. (Status: maintained) -- No Description Entered  Diminished interest in or enjoyment of activities. (Status: maintained) -- No Description Entered  Lack of energy. (Status: maintained) -- No Description Entered  Sad affect, social withdrawal, anxiety, loss of interest in activities, and low energy. (Status: maintained) -- No Description Entered  Medication Status compliance  Safety unspecified If Suicidal or Homicidal State Action Taken: unspecified  Current Risk:  Medications Buspar  (Dosage: 30mg  2X/day)  New Medication (Dosage: 250mg  2x/day)  New Medication (Dosage: 250mg  2x/day)  Prozac  (Dosage: 40mg  daily)  Remeron  (Dosage: 30mg  nightly)  xanax  (Dosage: .5mg  2-3X/day)  Objectives Related Problem: Recognize, accept, and cope with feelings of depression. Description: Identify and replace thoughts and beliefs that support depression. Target Date: 2025-04-28 Frequency: Daily Modality: individual Progress: 90%  Related Problem: Recognize, accept, and cope with feelings of depression. Description: Verbalize an understanding of healthy and unhealthy emotions with the intent of increasing the use of healthy emotions to guide actions. Target Date: 2025-04-28 Frequency: Daily Modality: individual Progress: 85%  Related Problem: Recognize, accept, and cope with feelings of depression. Description: Increasingly  verbalize hopeful and positive statements regarding self, others, and the future. Target Date: 2022-04-28 Frequency: Daily Modality: individual Progress: 100%  Related Problem: Reduce fear, anxiety, and worry associated with the medical condition. Description: Share with significant others efforts to adapt successfully to the medical condition/diagnosis. Target Date: 2025-04-28 Frequency: Daily Modality: individual Progress: 85%  Related Problem: Reduce fear, anxiety, and worry associated with the medical condition. Description: Engage in social, productive, and recreational activities that are possible in spite of medical condition. Target Date: 2025-04-28 Frequency: Daily Modality: individual Progress: 90%  Related Problem: Reduce fear, anxiety, and worry associated with the medical condition. Description: Identify the coping skills and sources of emotional support that have been beneficial in the past. Target Date: 2025-04-28 Frequency: Daily Modality: individual Progress: 95%  Client Response full compliance  Service Location Location, [address] [place], [place] [address]  Service Code cpt [phone]  Related past to present  Facilitate problem solving  Identify/label emotions  Validate/empathize  Self care activities  Lifestyle change (exercise, nutrition)  Assess/facilitate readiness to change  Relaxation training  Rationally challenge thoughts or beliefs/cognitive restructuring  Comments  Dx: F43.21  Meds: Prozac , Buspar   and Remeron .  Goals: Following the failure of a third marriage, patient is struggling to rebuild her life and adjust to her new circumstances. She suffers a negative self-concept and has little confidence in her judgement. She is seeking to reduce symptoms of anxiety and self-doubt and to re-define satisfa ction moving forward. Therapy will focus on cognitive restructuring to redefine her experiences and focus on those satisfying aspects of her  life. Will target this goal completion for April1, 2021 (completed). She is also wanting to utilize counseling to adjust and accept her latest diagnosis of cancer. She wants assistance to better manage this news and remain positive throughout treatment. Goal date is 12-22. Patient has made progress, but her medical condition is variable and she requests additional treatment to manage moods throughout her medical ordeal. In addition, she is trying to manage feelings associated with her older son's mental health struggles. Revised goal date is 12-26.  Additional goals: Patient has transitioned to a nursing/retirement home. She is seeking to better adjust to these new living circumstances. Goal date is 12-26.   Patient agrees to a video session and is aware of the limitations of this platform. She is at home and I am in home office.   [name]: Talked about how delusional she had been in her thinking she could stay in her house. She is now more realistic about her finances. She continues to feel guilt about being naive. She is just now starting to except it is water  over the dam and it serves no purpose to dwell on the past negative behaviors/experiences. She feels that her inpatient experience helped her let go and be more in touch with her gratitude during these difficult times,  [name] LEWIS Time: 2:15p-3:00p 45 minutes                                                "

## 2024-06-08 ENCOUNTER — Other Ambulatory Visit (HOSPITAL_COMMUNITY): Payer: Self-pay

## 2024-06-11 ENCOUNTER — Ambulatory Visit: Admitting: Psychology

## 2024-06-11 DIAGNOSIS — F4323 Adjustment disorder with mixed anxiety and depressed mood: Secondary | ICD-10-CM | POA: Diagnosis not present

## 2024-06-11 NOTE — Progress Notes (Signed)
 "                                                                                                                                                                                             [name] is a 74 y.o. 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  Symptoms A diagnosis of an acute, serious illness that is life-threatening. (Status: maintained) -- No Description Entered  Diminished interest in or enjoyment of activities. (Status: maintained) -- No Description Entered  Lack of energy. (Status: maintained) -- No Description Entered  Sad affect, social withdrawal, anxiety, loss of interest in activities, and low energy. (Status: maintained) -- No Description Entered  Medication Status compliance  Safety unspecified If Suicidal or Homicidal State Action Taken: unspecified  Current Risk:  Medications Buspar  (Dosage: 30mg  2X/day)  New Medication (Dosage: 250mg  2x/day)  New Medication (Dosage: 250mg  2x/day)  Prozac  (Dosage: 40mg  daily)  Remeron  (Dosage: 30mg  nightly)  xanax  (Dosage: .5mg  2-3X/day)  Objectives Related Problem: Recognize, accept, and cope with feelings of depression. Description: Identify and replace thoughts and beliefs that support depression. Target Date: 2025-04-28 Frequency: Daily Modality: individual Progress: 90%  Related Problem: Recognize, accept, and cope with feelings of depression. Description: Verbalize an understanding of healthy and unhealthy emotions with the intent of increasing the use of healthy emotions to guide actions. Target Date: 2025-04-28 Frequency: Daily Modality: individual Progress: 85%  Related Problem: Recognize, accept, and cope with feelings of  depression. Description: Increasingly verbalize hopeful and positive statements regarding self, others, and the future. Target Date: 2022-04-28 Frequency: Daily Modality: individual Progress: 100%  Related Problem: Reduce fear, anxiety, and worry associated with the medical condition. Description: Share with significant others efforts to adapt successfully to the medical condition/diagnosis. Target Date: 2025-04-28 Frequency: Daily Modality: individual Progress: 85%  Related Problem: Reduce fear, anxiety, and worry associated with the medical condition. Description: Engage in social, productive, and recreational activities that are possible in spite of medical condition. Target Date: 2025-04-28 Frequency: Daily Modality: individual Progress: 90%  Related Problem: Reduce fear, anxiety, and worry associated with the medical condition. Description: Identify the coping skills and sources of emotional support that have been beneficial in the past. Target Date: 2025-04-28 Frequency: Daily Modality: individual Progress: 95%  Client Response full compliance  Service Location Location, [address] [place], [place] [address]  Service Code cpt [phone]  Related past to present  Facilitate problem solving  Identify/label emotions  Validate/empathize  Self care activities  Lifestyle change (exercise, nutrition)  Assess/facilitate readiness to change  Relaxation training  Rationally challenge thoughts or beliefs/cognitive restructuring  Comments  Dx: F43.21  Meds: Prozac , Buspar  and Remeron .  Goals: Following the failure of a third marriage, patient is struggling to rebuild her life and adjust to her new circumstances. She suffers a negative self-concept and has little confidence in her judgement. She is seeking to reduce symptoms of anxiety and self-doubt and to re-define satisfa ction moving forward. Therapy will focus on cognitive restructuring to redefine her experiences and  focus on those satisfying aspects of her life. Will target this goal completion for April1, 2021 (completed). She is also wanting to utilize counseling to adjust and accept her latest diagnosis of cancer. She wants assistance to better manage this news and remain positive throughout treatment. Goal date is 12-22. Patient has made progress, but her medical condition is variable and she requests additional treatment to manage moods throughout her medical ordeal. In addition, she is trying to manage feelings associated with her older son's mental health struggles. Revised goal date is 12-26.  Additional goals: Patient has transitioned to a nursing/retirement home. She is seeking to better adjust to these new living circumstances. Goal date is 12-26.   Patient agrees to a video session and is aware of the limitations of this platform. She is at home and I am in home office.   [name]: She states that she had a rough night because her roommate was struggling and [name] was trying to help. The nurses told [name] not to get involved and to accommodate her roommate. Her feelings were hurt and she shared that with the nurse. Still upset. She is also upset that her son [name] has ghosted her. He told her we were never close and you know I am closer to dad. This was hurtful. Says she tries not to be intrusive and has backed off. He went from being attentive to not responding at all. We talked about her feelings of grief and how she might respond to [name] when they do get to speak.  Bohdi Leeds LEWIS Time: 4:15p-5:00p 45 minutes                                                 "

## 2024-06-13 ENCOUNTER — Ambulatory Visit: Admitting: Internal Medicine

## 2024-06-19 ENCOUNTER — Ambulatory Visit: Admitting: Psychology

## 2024-06-19 DIAGNOSIS — F4321 Adjustment disorder with depressed mood: Secondary | ICD-10-CM | POA: Diagnosis not present

## 2024-06-19 DIAGNOSIS — F4323 Adjustment disorder with mixed anxiety and depressed mood: Secondary | ICD-10-CM

## 2024-06-21 ENCOUNTER — Ambulatory Visit: Admitting: Internal Medicine

## 2024-06-25 ENCOUNTER — Ambulatory Visit: Admitting: Psychology

## 2024-07-03 ENCOUNTER — Ambulatory Visit: Admitting: Psychology

## 2024-07-06 ENCOUNTER — Inpatient Hospital Stay: Payer: Self-pay | Admitting: Gynecologic Oncology

## 2024-07-09 ENCOUNTER — Ambulatory Visit: Admitting: Psychology

## 2024-07-17 ENCOUNTER — Ambulatory Visit: Admitting: Psychology

## 2024-07-23 ENCOUNTER — Ambulatory Visit: Admitting: Psychology

## 2024-07-31 ENCOUNTER — Ambulatory Visit: Admitting: Psychology

## 2024-08-02 ENCOUNTER — Encounter: Admitting: Internal Medicine

## 2024-08-02 ENCOUNTER — Ambulatory Visit

## 2024-08-06 ENCOUNTER — Ambulatory Visit: Admitting: Psychology

## 2024-08-14 ENCOUNTER — Ambulatory Visit: Admitting: Psychology

## 2024-08-20 ENCOUNTER — Ambulatory Visit: Admitting: Psychology

## 2024-08-28 ENCOUNTER — Ambulatory Visit: Admitting: Psychology

## 2024-09-03 ENCOUNTER — Ambulatory Visit: Admitting: Psychology

## 2024-09-11 ENCOUNTER — Ambulatory Visit: Admitting: Psychology

## 2024-09-17 ENCOUNTER — Ambulatory Visit: Admitting: Psychology
